# Patient Record
Sex: Female | Born: 1950
Health system: Southern US, Community
[De-identification: ages and names within clinical notes are randomized; demographics above are authoritative.]

## PROBLEM LIST (undated history)

## (undated) DIAGNOSIS — E785 Hyperlipidemia, unspecified: Secondary | ICD-10-CM

## (undated) DIAGNOSIS — E119 Type 2 diabetes mellitus without complications: Secondary | ICD-10-CM

## (undated) DIAGNOSIS — I639 Cerebral infarction, unspecified: Secondary | ICD-10-CM

## (undated) DIAGNOSIS — G629 Polyneuropathy, unspecified: Secondary | ICD-10-CM

## (undated) DIAGNOSIS — N189 Chronic kidney disease, unspecified: Secondary | ICD-10-CM

## (undated) DIAGNOSIS — I10 Essential (primary) hypertension: Secondary | ICD-10-CM

## (undated) DIAGNOSIS — B001 Herpesviral vesicular dermatitis: Secondary | ICD-10-CM

## (undated) DIAGNOSIS — C541 Malignant neoplasm of endometrium: Secondary | ICD-10-CM

## (undated) DIAGNOSIS — F329 Major depressive disorder, single episode, unspecified: Secondary | ICD-10-CM

## (undated) DIAGNOSIS — H3500 Unspecified background retinopathy: Secondary | ICD-10-CM

## (undated) DIAGNOSIS — G4733 Obstructive sleep apnea (adult) (pediatric): Secondary | ICD-10-CM

## (undated) DIAGNOSIS — K219 Gastro-esophageal reflux disease without esophagitis: Secondary | ICD-10-CM

## (undated) HISTORY — PX: CERVICAL SPINE SURGERY: SHX589

## (undated) HISTORY — DX: Obstructive sleep apnea (adult) (pediatric): G47.33

## (undated) HISTORY — DX: Chronic kidney disease, unspecified: N18.9

## (undated) HISTORY — DX: Gastro-esophageal reflux disease without esophagitis: K21.9

## (undated) HISTORY — DX: Morbid (severe) obesity due to excess calories: E66.01

## (undated) HISTORY — DX: Cerebral infarction, unspecified: I63.9

## (undated) HISTORY — DX: Major depressive disorder, single episode, unspecified: F32.9

## (undated) HISTORY — PX: CHOLECYSTECTOMY: SHX55

## (undated) HISTORY — DX: Malignant neoplasm of endometrium: C54.1

## (undated) HISTORY — DX: Hyperlipidemia, unspecified: E78.5

## (undated) HISTORY — PX: DILATION AND CURETTAGE OF UTERUS: SHX78

## (undated) HISTORY — PX: TONSILLECTOMY: SUR1361

## (undated) HISTORY — PX: ADRENALECTOMY: SHX876

## (undated) HISTORY — DX: Polyneuropathy, unspecified: G62.9

## (undated) HISTORY — DX: Herpesviral vesicular dermatitis: B00.1

## (undated) HISTORY — PX: CARDIAC CATHETERIZATION: SHX172

## (undated) HISTORY — DX: Unspecified background retinopathy: H35.00

## (undated) HISTORY — DX: Essential (primary) hypertension: I10

## (undated) HISTORY — PX: BUNIONECTOMY: SHX129

## (undated) HISTORY — DX: Type 2 diabetes mellitus without complications: E11.9

## (undated) HISTORY — PX: THORACOTOMY: SUR1349

## (undated) HISTORY — PX: LAPAROSCOPIC SALPINGOOPHERECTOMY: SUR795

---

## 2001-08-17 ENCOUNTER — Encounter: Payer: Self-pay | Admitting: Family Medicine

## 2001-08-17 ENCOUNTER — Encounter: Admission: RE | Admit: 2001-08-17 | Discharge: 2001-08-17 | Payer: Self-pay | Admitting: Family Medicine

## 2001-08-30 ENCOUNTER — Encounter: Payer: Self-pay | Admitting: Neurosurgery

## 2001-08-31 ENCOUNTER — Encounter: Payer: Self-pay | Admitting: Neurosurgery

## 2001-08-31 ENCOUNTER — Observation Stay (HOSPITAL_COMMUNITY): Admission: RE | Admit: 2001-08-31 | Discharge: 2001-09-01 | Payer: Self-pay | Admitting: Neurosurgery

## 2001-10-01 ENCOUNTER — Encounter: Payer: Self-pay | Admitting: Specialist

## 2001-10-01 ENCOUNTER — Encounter: Admission: RE | Admit: 2001-10-01 | Discharge: 2001-10-01 | Payer: Self-pay | Admitting: Specialist

## 2001-10-02 ENCOUNTER — Ambulatory Visit (HOSPITAL_BASED_OUTPATIENT_CLINIC_OR_DEPARTMENT_OTHER): Admission: RE | Admit: 2001-10-02 | Discharge: 2001-10-02 | Payer: Self-pay | Admitting: Specialist

## 2006-03-02 ENCOUNTER — Inpatient Hospital Stay (HOSPITAL_COMMUNITY): Admission: RE | Admit: 2006-03-02 | Discharge: 2006-03-11 | Payer: Self-pay | Admitting: Neurosurgery

## 2006-03-08 ENCOUNTER — Ambulatory Visit: Payer: Self-pay | Admitting: Physical Medicine & Rehabilitation

## 2006-04-05 ENCOUNTER — Encounter: Admission: RE | Admit: 2006-04-05 | Discharge: 2006-04-05 | Payer: Self-pay | Admitting: Thoracic Surgery

## 2006-05-24 ENCOUNTER — Encounter: Admission: RE | Admit: 2006-05-24 | Discharge: 2006-05-24 | Payer: Self-pay | Admitting: Thoracic Surgery

## 2006-05-24 ENCOUNTER — Ambulatory Visit: Payer: Self-pay | Admitting: Thoracic Surgery

## 2006-07-14 ENCOUNTER — Encounter (INDEPENDENT_AMBULATORY_CARE_PROVIDER_SITE_OTHER): Payer: Self-pay | Admitting: Specialist

## 2006-07-14 ENCOUNTER — Ambulatory Visit (HOSPITAL_COMMUNITY): Admission: RE | Admit: 2006-07-14 | Discharge: 2006-07-17 | Payer: Self-pay | Admitting: Surgery

## 2007-05-15 IMAGING — CR DG CHEST 2V
2 series · 2 of 2 positions shown · non-contrast
Comparison: 03/10/06.

CLINICAL DATA: Lung lesion.
 CHEST - TWO VIEWS:

[view not recorded (1 of 2)]
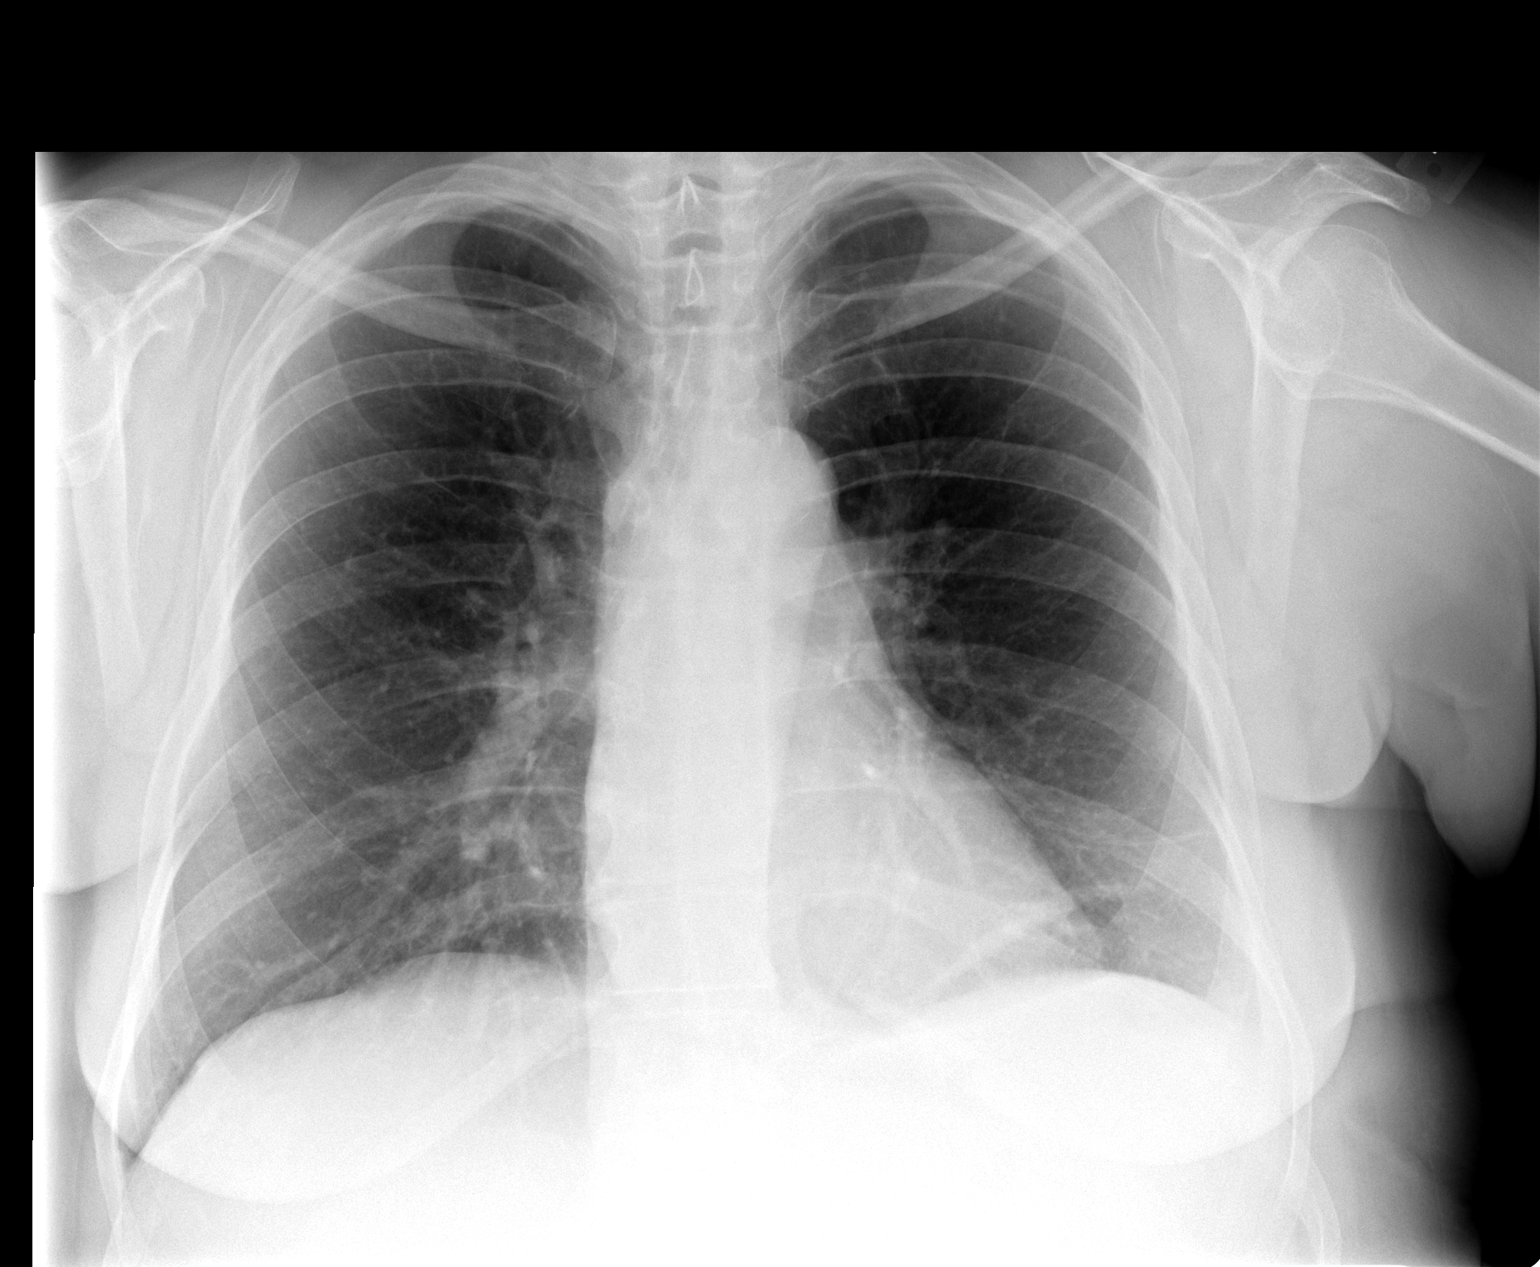

[view not recorded (2 of 2)]
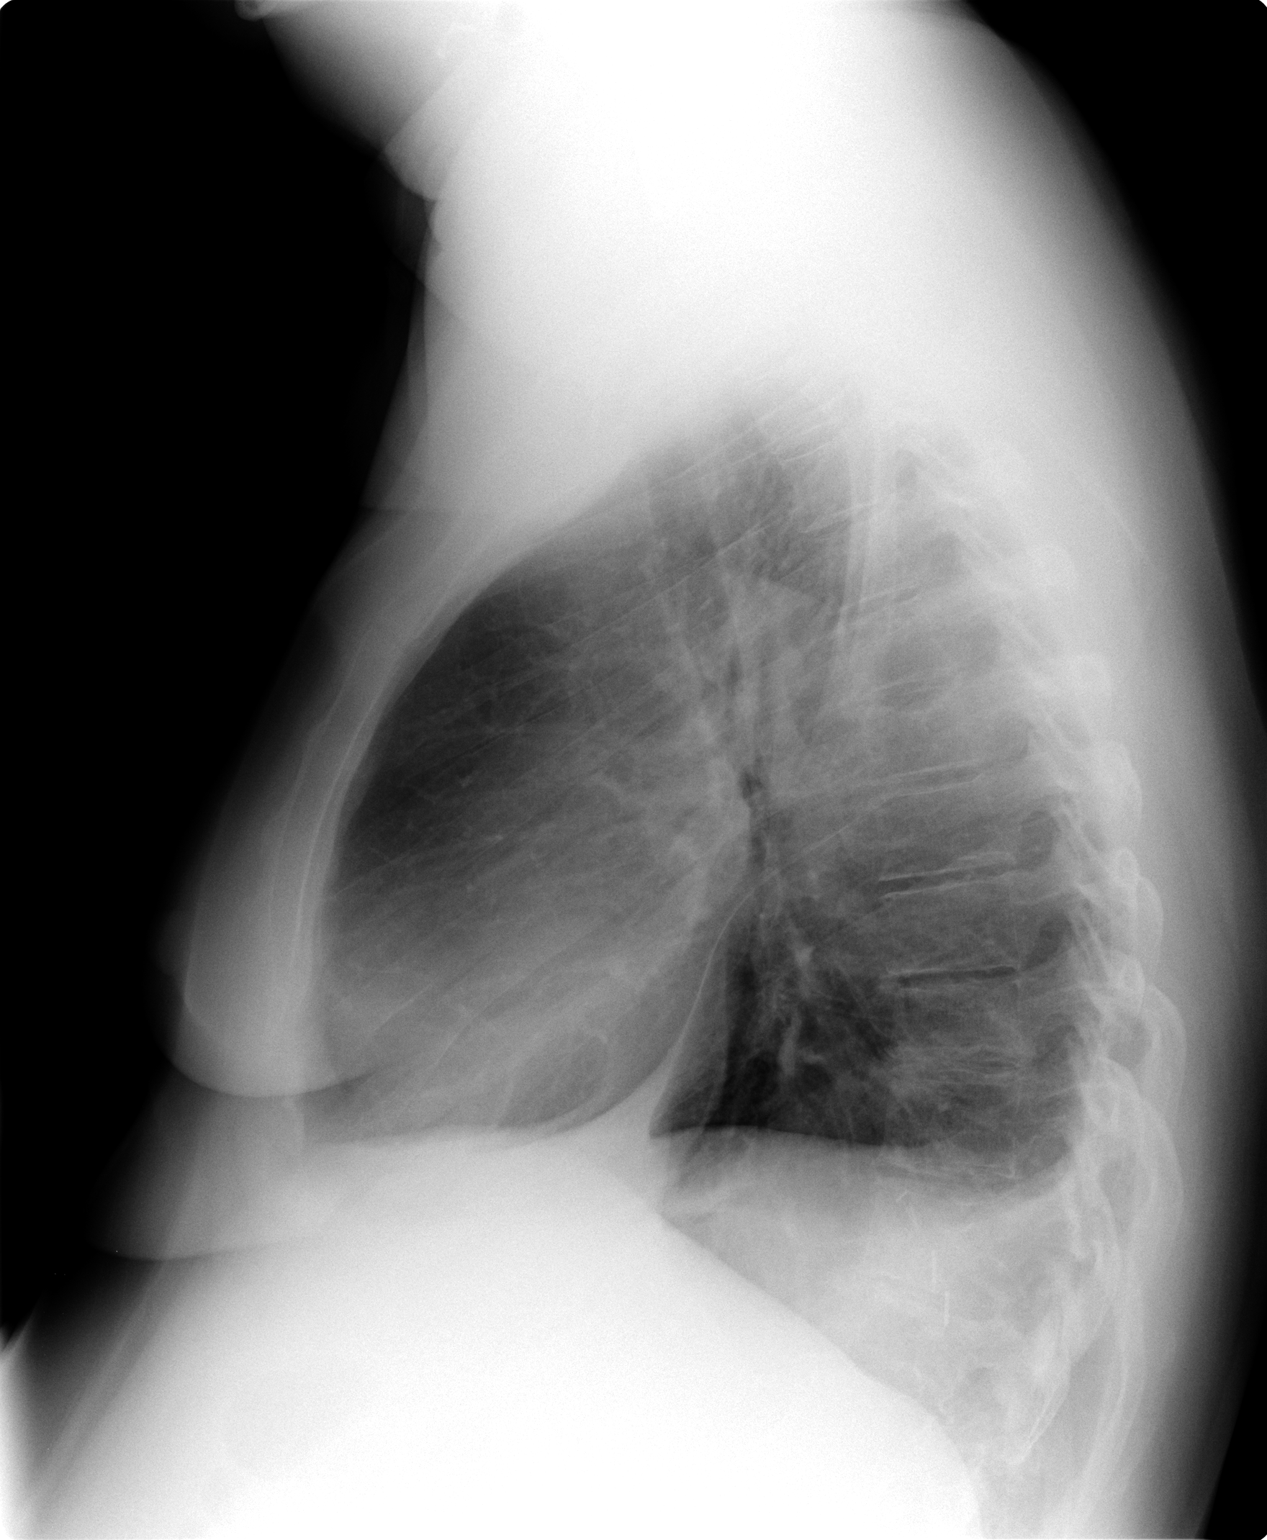

[2 of 2 positions shown; findings below may reference images not displayed]

FINDINGS: Trachea is midline.  Heart size within normal limits.  Left base atelectasis.  Lungs otherwise clear.  Small pleural effusion.
IMPRESSION: Left base atelectasis and small pleural effusion.

## 2007-07-31 ENCOUNTER — Inpatient Hospital Stay (HOSPITAL_COMMUNITY): Admission: AD | Admit: 2007-07-31 | Discharge: 2007-08-02 | Payer: Self-pay | Admitting: Interventional Cardiology

## 2007-08-16 ENCOUNTER — Encounter (HOSPITAL_COMMUNITY): Admission: RE | Admit: 2007-08-16 | Discharge: 2007-11-14 | Payer: Self-pay | Admitting: Interventional Cardiology

## 2007-11-15 ENCOUNTER — Encounter (HOSPITAL_COMMUNITY): Admission: RE | Admit: 2007-11-15 | Discharge: 2007-12-17 | Payer: Self-pay | Admitting: Cardiology

## 2008-06-23 ENCOUNTER — Encounter: Admission: RE | Admit: 2008-06-23 | Discharge: 2008-06-23 | Payer: Self-pay | Admitting: Podiatry

## 2010-03-21 DIAGNOSIS — C541 Malignant neoplasm of endometrium: Secondary | ICD-10-CM

## 2010-03-21 HISTORY — DX: Malignant neoplasm of endometrium: C54.1

## 2010-03-24 ENCOUNTER — Emergency Department (HOSPITAL_BASED_OUTPATIENT_CLINIC_OR_DEPARTMENT_OTHER)
Admission: EM | Admit: 2010-03-24 | Discharge: 2010-03-24 | Payer: Self-pay | Source: Home / Self Care | Admitting: Emergency Medicine

## 2010-03-24 LAB — GLUCOSE, CAPILLARY
Glucose-Capillary: 287 mg/dL — ABNORMAL HIGH (ref 70–99)
Glucose-Capillary: 195 mg/dL — ABNORMAL HIGH (ref 70–99)

## 2010-03-24 LAB — POCT CARDIAC MARKERS
CKMB, poc: 2.1 ng/mL (ref 1.0–8.0)
Myoglobin, poc: 120 ng/mL (ref 12–200)
Troponin i, poc: <0.05 ng/mL (ref 0.00–0.09)

## 2010-03-24 LAB — URINALYSIS, ROUTINE W REFLEX MICROSCOPIC
Bilirubin Urine: NEGATIVE
Ketones, ur: NEGATIVE mg/dL
Nitrite: NEGATIVE
Protein, ur: NEGATIVE mg/dL
Specific Gravity, Urine: 1.024 (ref 1.005–1.030)
Urine Glucose, Fasting: NEGATIVE mg/dL
Urobilinogen, UA: 0.2 mg/dL (ref 0.0–1.0)
pH: 5.5 (ref 5.0–8.0)

## 2010-03-24 LAB — COMPREHENSIVE METABOLIC PANEL
ALT: 36 U/L — ABNORMAL HIGH (ref 0–35)
AST: 32 U/L (ref 0–37)
Albumin: 4.6 g/dL (ref 3.5–5.2)
Alkaline Phosphatase: 95 U/L (ref 39–117)
BUN: 31 mg/dL — ABNORMAL HIGH (ref 6–23)
CO2: 26 mEq/L (ref 19–32)
Calcium: 10.6 mg/dL — ABNORMAL HIGH (ref 8.4–10.5)
Chloride: 98 mEq/L (ref 96–112)
Creatinine, Ser: 1.1 mg/dL (ref 0.4–1.2)
GFR calc Af Amer: >60 mL/min (ref >60)
GFR calc non Af Amer: 51 mL/min — ABNORMAL LOW (ref >60)
Glucose, Bld: 284 mg/dL — ABNORMAL HIGH (ref 70–99)
Potassium: 4.8 mEq/L (ref 3.5–5.1)
Sodium: 141 mEq/L (ref 135–145)
Total Bilirubin: 0.8 mg/dL (ref 0.3–1.2)
Total Protein: 8.5 g/dL — ABNORMAL HIGH (ref 6.0–8.3)

## 2010-03-24 LAB — DIFFERENTIAL
Basophils Absolute: 0 10*3/uL (ref 0.0–0.1)
Basophils Relative: 0 % (ref 0–1)
Eosinophils Absolute: 0.2 10*3/uL (ref 0.0–0.7)
Eosinophils Relative: 2 % (ref 0–5)
Lymphocytes Relative: 14 % (ref 12–46)
Lymphs Abs: 1.6 10*3/uL (ref 0.7–4.0)
Monocytes Absolute: 0.8 10*3/uL (ref 0.1–1.0)
Monocytes Relative: 7 % (ref 3–12)
Neutro Abs: 9 10*3/uL — ABNORMAL HIGH (ref 1.7–7.7)
Neutrophils Relative %: 77 % (ref 43–77)

## 2010-03-24 LAB — URINE MICROSCOPIC-ADD ON

## 2010-03-24 LAB — CBC
HCT: 38.8 % (ref 36.0–46.0)
Hemoglobin: 13.3 g/dL (ref 12.0–15.0)
MCH: 31.4 pg (ref 26.0–34.0)
MCHC: 34.3 g/dL (ref 30.0–36.0)
MCV: 91.5 fL (ref 78.0–100.0)
Platelets: 288 10*3/uL (ref 150–400)
RBC: 4.24 MIL/uL (ref 3.87–5.11)
RDW: 14.1 % (ref 11.5–15.5)
WBC: 11.6 10*3/uL — ABNORMAL HIGH (ref 4.0–10.5)

## 2010-03-24 LAB — LIPASE, BLOOD: Lipase: 230 U/L (ref 23–300)

## 2010-03-26 LAB — URINE CULTURE
Colony Count: >=100000
Culture  Setup Time: 201201041735

## 2010-04-11 ENCOUNTER — Encounter: Payer: Self-pay | Admitting: Thoracic Surgery

## 2010-04-26 ENCOUNTER — Encounter (HOSPITAL_COMMUNITY)
Admission: RE | Admit: 2010-04-26 | Discharge: 2010-04-26 | Disposition: A | Discharge status: Home / Self Care | Payer: BC Managed Care – PPO | Source: Ambulatory Visit | Attending: Obstetrics and Gynecology | Admitting: Obstetrics and Gynecology

## 2010-04-26 DIAGNOSIS — Z01812 Encounter for preprocedural laboratory examination: Secondary | ICD-10-CM | POA: Insufficient documentation

## 2010-04-26 LAB — CBC
HCT: 35.4 % — ABNORMAL LOW (ref 36.0–46.0)
Hemoglobin: 11.5 g/dL — ABNORMAL LOW (ref 12.0–15.0)
MCH: 30.9 pg (ref 26.0–34.0)
MCHC: 32.5 g/dL (ref 30.0–36.0)
MCV: 95.2 fL (ref 78.0–100.0)
Platelets: 244 10*3/uL (ref 150–400)
RBC: 3.72 MIL/uL — ABNORMAL LOW (ref 3.87–5.11)
RDW: 13.9 % (ref 11.5–15.5)
WBC: 7.8 10*3/uL (ref 4.0–10.5)

## 2010-04-26 LAB — BASIC METABOLIC PANEL
BUN: 14 mg/dL (ref 6–23)
CO2: 29 mEq/L (ref 19–32)
Calcium: 9.3 mg/dL (ref 8.4–10.5)
Chloride: 101 mEq/L (ref 96–112)
Creatinine, Ser: 0.94 mg/dL (ref 0.4–1.2)
GFR calc Af Amer: >60 mL/min (ref >60)
GFR calc non Af Amer: >60 mL/min (ref >60)
Glucose, Bld: 193 mg/dL — ABNORMAL HIGH (ref 70–99)
Potassium: 4.8 mEq/L (ref 3.5–5.1)
Sodium: 137 mEq/L (ref 135–145)

## 2010-04-27 ENCOUNTER — Ambulatory Visit (HOSPITAL_COMMUNITY)
Admission: RE | Admit: 2010-04-27 | Discharge: 2010-04-27 | Disposition: A | Discharge status: Home / Self Care | Payer: BC Managed Care – PPO | Source: Ambulatory Visit | Attending: Obstetrics and Gynecology | Admitting: Obstetrics and Gynecology

## 2010-04-27 ENCOUNTER — Other Ambulatory Visit: Payer: Self-pay | Admitting: Obstetrics and Gynecology

## 2010-04-27 DIAGNOSIS — N950 Postmenopausal bleeding: Secondary | ICD-10-CM | POA: Insufficient documentation

## 2010-04-27 DIAGNOSIS — Z01812 Encounter for preprocedural laboratory examination: Secondary | ICD-10-CM | POA: Insufficient documentation

## 2010-04-27 LAB — GLUCOSE, CAPILLARY: Glucose-Capillary: 201 mg/dL — ABNORMAL HIGH (ref 70–99)

## 2010-05-07 NOTE — Op Note (Signed)
  [name], [name]               ACCOUNT NO.:  [id number]  MEDICAL RECORD NO.:  [id number]           PATIENT TYPE:  O  LOCATION:  [facility]                          [facility]ACILITY[facility]  PHYSICIAN:  Patsy Baltimore, MD     DATE OF BIRTH:  Apr 22, 1950  DATE OF PROCEDURE:  04/27/2010 DATE OF DISCHARGE:                              OPERATIVE REPORT   PREOPERATIVE DIAGNOSIS:  Postmenopausal bleeding.  POSTOPERATIVE DIAGNOSIS:  Postmenopausal bleeding.  PROCEDURE PERFORMED:  Dilation and curettage.  SURGEON:  Patsy Baltimore, MD  ANESTHESIA:  MAC.  SPECIMENS SENT:  Endometrial curettings.  ESTIMATED BLOOD LOSS:  Minimal.  COMPLICATIONS:  None.  [name] is a 60 year old para 0 who presented to the office with postmenopausal bleeding.  She was unable to tolerate an office endometrial biopsy.  Therefore, she was scheduled for a formal D and C in the operating room.  Informed consent was obtained from the patient. On the day of the surgery, she was taken to the operating room with IV fluids running.  She was put under MAC anesthesia and then placed in the dorsal lithotomy position.  Her bladder was emptied.  She was prepped and draped in the usual sterile fashion.  A time-out was called and we began the procedure.  A speculum was inserted into the vagina.  A single- toothed tenaculum was used to grasp the anterior lip of the cervix.  Her cervix was then dilated.  A serrated curette was used to scrape the endometrial lining.  The specimen was sent off as endometrial curettings.  All the instruments were then removed from the vagina.  The patient was reawakened and transferred to the PACU in stable condition. Sponge and instrument counts were correct x2.          ______________________________ Patsy Baltimore, MD     CO/MEDQ  D:  04/27/2010  T:  04/28/2010  Job:  [id number]  Electronically Signed by Patsy Baltimore MD on 05/07/2010 06:41:01 PM

## 2010-05-13 ENCOUNTER — Ambulatory Visit: Payer: BC Managed Care – PPO | Attending: Gynecologic Oncology | Admitting: Gynecologic Oncology

## 2010-05-13 DIAGNOSIS — Z7982 Long term (current) use of aspirin: Secondary | ICD-10-CM | POA: Insufficient documentation

## 2010-05-13 DIAGNOSIS — I10 Essential (primary) hypertension: Secondary | ICD-10-CM | POA: Insufficient documentation

## 2010-05-13 DIAGNOSIS — F329 Major depressive disorder, single episode, unspecified: Secondary | ICD-10-CM | POA: Insufficient documentation

## 2010-05-13 DIAGNOSIS — F3289 Other specified depressive episodes: Secondary | ICD-10-CM | POA: Insufficient documentation

## 2010-05-13 DIAGNOSIS — Z79899 Other long term (current) drug therapy: Secondary | ICD-10-CM | POA: Insufficient documentation

## 2010-05-13 DIAGNOSIS — K219 Gastro-esophageal reflux disease without esophagitis: Secondary | ICD-10-CM | POA: Insufficient documentation

## 2010-05-13 DIAGNOSIS — C549 Malignant neoplasm of corpus uteri, unspecified: Secondary | ICD-10-CM | POA: Insufficient documentation

## 2010-05-13 DIAGNOSIS — Z803 Family history of malignant neoplasm of breast: Secondary | ICD-10-CM | POA: Insufficient documentation

## 2010-05-13 DIAGNOSIS — Z8042 Family history of malignant neoplasm of prostate: Secondary | ICD-10-CM | POA: Insufficient documentation

## 2010-05-13 DIAGNOSIS — N854 Malposition of uterus: Secondary | ICD-10-CM | POA: Insufficient documentation

## 2010-05-13 DIAGNOSIS — Z880 Allergy status to penicillin: Secondary | ICD-10-CM | POA: Insufficient documentation

## 2010-05-13 DIAGNOSIS — E7800 Pure hypercholesterolemia, unspecified: Secondary | ICD-10-CM | POA: Insufficient documentation

## 2010-05-13 DIAGNOSIS — E119 Type 2 diabetes mellitus without complications: Secondary | ICD-10-CM | POA: Insufficient documentation

## 2010-06-01 ENCOUNTER — Other Ambulatory Visit (HOSPITAL_COMMUNITY): Payer: BC Managed Care – PPO

## 2010-06-04 ENCOUNTER — Other Ambulatory Visit: Payer: Self-pay | Admitting: Gynecologic Oncology

## 2010-06-04 ENCOUNTER — Ambulatory Visit (HOSPITAL_COMMUNITY)
Admission: RE | Admit: 2010-06-04 | Discharge: 2010-06-04 | Disposition: A | Discharge status: Home / Self Care | Payer: BC Managed Care – PPO | Source: Ambulatory Visit | Attending: Obstetrics & Gynecology | Admitting: Obstetrics & Gynecology

## 2010-06-04 ENCOUNTER — Other Ambulatory Visit: Payer: Self-pay | Admitting: Obstetrics & Gynecology

## 2010-06-04 ENCOUNTER — Encounter (HOSPITAL_COMMUNITY): Payer: BC Managed Care – PPO

## 2010-06-04 DIAGNOSIS — C541 Malignant neoplasm of endometrium: Secondary | ICD-10-CM

## 2010-06-04 DIAGNOSIS — Z01818 Encounter for other preprocedural examination: Secondary | ICD-10-CM | POA: Insufficient documentation

## 2010-06-04 DIAGNOSIS — Z01812 Encounter for preprocedural laboratory examination: Secondary | ICD-10-CM | POA: Insufficient documentation

## 2010-06-04 DIAGNOSIS — E119 Type 2 diabetes mellitus without complications: Secondary | ICD-10-CM | POA: Insufficient documentation

## 2010-06-04 DIAGNOSIS — I10 Essential (primary) hypertension: Secondary | ICD-10-CM | POA: Insufficient documentation

## 2010-06-04 DIAGNOSIS — C549 Malignant neoplasm of corpus uteri, unspecified: Secondary | ICD-10-CM | POA: Insufficient documentation

## 2010-06-04 LAB — DIFFERENTIAL
Basophils Absolute: 0.1 10*3/uL (ref 0.0–0.1)
Basophils Relative: 1 % (ref 0–1)
Eosinophils Absolute: 0.2 10*3/uL (ref 0.0–0.7)
Eosinophils Relative: 2 % (ref 0–5)
Lymphocytes Relative: 26 % (ref 12–46)
Lymphs Abs: 2.2 10*3/uL (ref 0.7–4.0)
Monocytes Absolute: 0.7 10*3/uL (ref 0.1–1.0)
Monocytes Relative: 9 % (ref 3–12)
Neutro Abs: 5.3 10*3/uL (ref 1.7–7.7)
Neutrophils Relative %: 63 % (ref 43–77)

## 2010-06-04 LAB — BASIC METABOLIC PANEL
BUN: 20 mg/dL (ref 6–23)
CO2: 26 mEq/L (ref 19–32)
Calcium: 9.6 mg/dL (ref 8.4–10.5)
Chloride: 102 mEq/L (ref 96–112)
Creatinine, Ser: 0.97 mg/dL (ref 0.4–1.2)
GFR calc Af Amer: >60 mL/min (ref >60)
GFR calc non Af Amer: 59 mL/min — ABNORMAL LOW (ref >60)
Glucose, Bld: 264 mg/dL — ABNORMAL HIGH (ref 70–99)
Potassium: 4.1 mEq/L (ref 3.5–5.1)
Sodium: 136 mEq/L (ref 135–145)

## 2010-06-04 LAB — CBC
HCT: 36.4 % (ref 36.0–46.0)
Hemoglobin: 11.7 g/dL — ABNORMAL LOW (ref 12.0–15.0)
MCH: 30.8 pg (ref 26.0–34.0)
MCHC: 32.1 g/dL (ref 30.0–36.0)
MCV: 95.8 fL (ref 78.0–100.0)
Platelets: 247 10*3/uL (ref 150–400)
RBC: 3.8 MIL/uL — ABNORMAL LOW (ref 3.87–5.11)
RDW: 14.5 % (ref 11.5–15.5)
WBC: 8.4 10*3/uL (ref 4.0–10.5)

## 2010-06-04 LAB — ABO/RH: ABO/RH(D): O POS

## 2010-06-04 LAB — SURGICAL PCR SCREEN
MRSA, PCR: NEGATIVE
Staphylococcus aureus: NEGATIVE

## 2010-06-04 LAB — TYPE AND SCREEN
ABO/RH(D): O POS
Antibody Screen: NEGATIVE

## 2010-06-08 ENCOUNTER — Other Ambulatory Visit: Payer: Self-pay | Admitting: Gynecologic Oncology

## 2010-06-08 ENCOUNTER — Ambulatory Visit (HOSPITAL_COMMUNITY)
Admission: RE | Admit: 2010-06-08 | Discharge: 2010-06-09 | Disposition: A | Discharge status: Home / Self Care | Payer: BC Managed Care – PPO | Source: Ambulatory Visit | Attending: Obstetrics & Gynecology | Admitting: Obstetrics & Gynecology

## 2010-06-08 DIAGNOSIS — Z01812 Encounter for preprocedural laboratory examination: Secondary | ICD-10-CM | POA: Insufficient documentation

## 2010-06-08 DIAGNOSIS — E119 Type 2 diabetes mellitus without complications: Secondary | ICD-10-CM | POA: Insufficient documentation

## 2010-06-08 DIAGNOSIS — I10 Essential (primary) hypertension: Secondary | ICD-10-CM | POA: Insufficient documentation

## 2010-06-08 DIAGNOSIS — C549 Malignant neoplasm of corpus uteri, unspecified: Secondary | ICD-10-CM | POA: Insufficient documentation

## 2010-06-08 DIAGNOSIS — Z01811 Encounter for preprocedural respiratory examination: Secondary | ICD-10-CM | POA: Insufficient documentation

## 2010-06-08 LAB — GLUCOSE, CAPILLARY
Glucose-Capillary: 221 mg/dL — ABNORMAL HIGH (ref 70–99)
Glucose-Capillary: 245 mg/dL — ABNORMAL HIGH (ref 70–99)
Glucose-Capillary: 252 mg/dL — ABNORMAL HIGH (ref 70–99)
Glucose-Capillary: 267 mg/dL — ABNORMAL HIGH (ref 70–99)

## 2010-06-09 LAB — GLUCOSE, CAPILLARY
Glucose-Capillary: 227 mg/dL — ABNORMAL HIGH (ref 70–99)
Glucose-Capillary: 186 mg/dL — ABNORMAL HIGH (ref 70–99)

## 2010-06-09 LAB — CBC
HCT: 31.2 % — ABNORMAL LOW (ref 36.0–46.0)
Hemoglobin: 10.3 g/dL — ABNORMAL LOW (ref 12.0–15.0)
MCH: 31.2 pg (ref 26.0–34.0)
MCHC: 33 g/dL (ref 30.0–36.0)
MCV: 94.5 fL (ref 78.0–100.0)
Platelets: 219 10*3/uL (ref 150–400)
RBC: 3.3 MIL/uL — ABNORMAL LOW (ref 3.87–5.11)
RDW: 14 % (ref 11.5–15.5)
WBC: 11.3 10*3/uL — ABNORMAL HIGH (ref 4.0–10.5)

## 2010-06-09 NOTE — Op Note (Signed)
[name], ENGE               ACCOUNT NO.:  [id number]  MEDICAL RECORD NO.:  [id number]           PATIENT TYPE:  O  LOCATION:  [facility]                         FACILITY:  [facility]  PHYSICIAN:  Laurette Schimke, MD     DATE OF BIRTH:  1950-07-04  DATE OF PROCEDURE:  06/08/2010 DATE OF DISCHARGE:                              OPERATIVE REPORT   PREOPERATIVE DIAGNOSIS:  Endometrial cancer.  POSTOPERATIVE DIAGNOSIS:  Stage IA endometrial cancer.  PROCEDURE: 1. Robotic hysterectomy. 2. Bilateral salpingo-oophorectomy. 3. Right pelvic lymph node sampling.  SURGEON:  Laurette Schimke, M.D.  ASSISTANT: 1. Roseanna Rainbow, M.D. 2. Telford Nab, R.N.  ANESTHESIA:  General endotracheal.  FINDINGS:  A 6-cm uterus with posterior leiomyoma, normal appearing adnexa.  SPECIMENS:  Washings, uterus, cervix, ovaries, tubes, and right pelvic lymph node.  FINDINGS:  The patient was noted to be morbidly obese.  Uterus was noted to have a posterior leiomyoma.  The omentum was adherent to the left upper quadrant, and dissection was required.  Adnexa appeared grossly within normal limits.  DESCRIPTION OF PROCEDURE:  The patient taken to operating room, placed under general endotracheal anesthesia.  She was then placed in dorsal lithotomy position.  The abdomen and pelvis were prepped.  The uterus was sounded to approximately 6 cm.  The uterine manipulator was inserted as was a vaginal balloon and Foley catheter.  She was then draped in sterile fashion.  An OG tube previously placed had return of green gastric secretions.  An incision was made approximately 2 cm inferior to the left costal margin, and under direct visualization, the abdominal cavity was entered and the abdomen was insufflated.  A pressure 15 mmHg was obtained and never exceeded that threshold.  The left abdominal adhesions to the omentum were noted.  An incision was made at the level of the umbilicus and 10 cm lateral to  the umbilical incision.  Ports were placed in the left abdominal incision and the adhesions on the right were removed.  The robotic ports were then inserted in the right upper quadrant, additionally in the left pelvis.  The robot was successfully docked.  Washings were obtained.  With great difficulty prior to docking, bowel was removed from the pelvis.  Visualization was somewhat limited by the tethered rectosigmoid and loops of small bowel.  Incision was made in the right round ligament and retroperitoneal space entered.  The broad ligament was skeletonized and the infundibulopelvic ligaments grasped, cauterized, and transected.  The lower uterine flap was created and the posterior peritoneum dissected off the lower aspect of the cervix and uterosacral ligaments.  The uterine vessels were cauterized and then transected.  In a similar fashion, the contralateral round ligament was transected.  The retroperitoneal space entered. Infundibulopelvic ligament clamped and transected, and the remainder of the bladder flap dissected.  In the similar fashion, the peritoneum was dissected off the posterior aspect of the cervix.  The vaginal balloon was insufflated.  Uterine vessels on the left were coagulated and transected.  The ureters were both visualized.  Vaginal colpotomy was then made and the specimen removed and taken immediately for  frozen section.  Right pelvic lymph node dissection was performed.  The anterior vesical artery was identified.  The anterior and posterior spaces were dissected. The obturator nerve was visualized.  Dissection was begun off the external iliac artery, the obturator nerve, and external iliac artery.  Before completion of the procedure, frozen section returned with evidence of no Dillon disease and very minimal myometrial invasion of a grade 1 lesion.  The already transected specimens were placed in an Endopouch and removed from the abdominal cavity.  The  abdomen was copiously irrigated and drained.  Hemostasis was assured.  The vaginal cuff was closed with 0 Vicryl suture in a running fashion.  The surgical sites were once again inspected, suction applied, abdomen and pelvis irrigated and hemostasis assured.  Previously administered indigo carmine returned in the Foley catheter was no spill of indigo carmine within the abdominal cavity.  All the port sites of all the incisions with ports created 8 mm were closed with an interrupted 0 Vicryl suture. The port sites were irrigated and hemostasis assured.  The skin was closed with a running subcuticular suture.  Steri-Strips were placed over the incision.  Sponge, instrument, and needle count correct x3.  DISPOSITION:  The patient is extubated and taken to recovery room in stable condition.     Laurette Schimke, MD     WB/MEDQ  D:  06/08/2010  T:  06/08/2010  Job:  [id number]  cc:   Roseanna Rainbow, M.D. Fax: [phone]  Telford Nab, R.N. [address] [place], [place] [address]  Patsy Baltimore, MD Fax: [phone]  Electronically Signed by Laurette Schimke MD on 06/09/2010 02:19:47 PM

## 2010-06-11 NOTE — Consult Note (Signed)
[name], [name]               ACCOUNT NO.:  [id number]  MEDICAL RECORD NO.:  [id number]           PATIENT TYPE:  LOCATION:                                 FACILITY:  PHYSICIAN:  Laurette Schimke, MD     DATE OF BIRTH:  Nov 05, 1950  DATE OF CONSULTATION:  05/13/2010 DATE OF DISCHARGE:                                CONSULTATION   REFERRING PHYSICIAN:  Patsy Baltimore, MD  REASON FOR VISIT:  Consult was requested by Dr. Gevena Cotton for management of a grade 1 endometrial cancer.  HISTORY OF PRESENT ILLNESS:  [name] is a 60 year old nulliparous female, last normal menstrual period in the mid 16s.  Bleeding was noted on March 23, 2010, and she presented to emergency room on March 24, 2010.  She was then seen and evaluated by Dr. Patsy Baltimore.  An endometrial biopsy was not feasible because of the retroverted uterus and the patient's body habitus.  D and C was performed on April 27, 2010, and this returned with evidence of endometrial polyp in addition to invasive endometrial adenocarcinoma FIGO grade 1.  [name] denies bleeding since the procedure.  Overall, she denies any weight loss.  No change in her appetite.  She reports intermittent constipation.  There is no hematuria.  She reports rectal bleeding associated with her hemorrhoids.  PAST MEDICAL HISTORY: 1. Non-insulin-dependent diabetes mellitus, diagnosed in 1995. 2. Hypertension, diagnosed in 1995. 3. Hypercholesterolemia, diagnosed in 1995. 4. Depression dating to 2003. 5. Takotsubo cardiomyopathy in 2009. 6. Gastroesophageal reflux disease.  PAST SURGICAL HISTORY: 1. Tonsillectomy in 1957. 2. Oral surgery in 1967. 3. Resection of a wisdom tooth in 1980. 4. Open cholecystectomy in 1996. 5. Placement of a titanium disk in the neck in 2003. 6. Arthroscopic surgery of the left knee in 2003. 7. Partial toe amputation of the 3rd left toe in 2007. 8. Laparoscopic thoracotomy in 2007. 9. Laparoscopic removal of  left adrenal gland for a benign tumor in     2008. 10.Resection of left 5th metatarsal in 2009. 11.Tendon repair of the right ankle in 2010. 12.Uterine dilatation and curettage in 2012.  MEDICATIONS: 1. Aspirin 325 mg daily. 2. Fish oil capsules. 3. Calcium 600 mg daily. 4. Metformin hydrochloride 1000 mg twice daily. 5. Glimepiride 4 mg daily. 6. Losartan. 7. Potassium 50 mg daily. 8. Carvedilol 12.5 mg daily. 9. Pravastatin 40 mg daily. 10.Ranitidine 300 mg twice daily.  OBSTETRIC/GYNECOLOGIC HISTORY:  Nulliparous.  No history of abnormal Pap tests, last Pap greater than 3 years ago.  Menarche occurred at age of 60.  Menopause in the mid 40s.  FAMILY HISTORY:  Paternal aunt with breast cancer in the 75s, a paternal uncle with prostate cancer in the 53s, and a paternal grandmother with melanoma.  Her father is alive after a heart attack at the age of 61. History of hypertension in the maternal family.  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  She is single.  She denies tobacco, alcohol, or IV drug abuse.  Her sister accompanies her today.  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  REVIEW OF SYSTEMS:  Ten-point review of systems only noteworthy as above.  ALLERGIES: 1. SULFA that causes a rash. 2. PENICILLIN that causes a rash.  PHYSICAL EXAMINATION:  GENERAL:  Well-developed female in no acute distress. VITAL SIGNS:  Weight 284 pounds, height 5 feet 10 inches, BMI of 41. CHEST:  Clear to auscultation. HEART:  Regular rate and rhythm. ABDOMEN:  Cholecystectomy scar visible.  Port sites from the thoracotomy identified on the left.  The abdomen is obese and soft. PELVIC EXAMINATION:  Normal external genitalia, Bartholin's, urethral, and Skene's.  Nulliparous cervix, very well supported.  Unable to assess the size of the uterus because of the patient's body habitus.  No nodularities appreciated within the  cul-de-sac. EXTREMITIES:  2+ lower extremity edema with deformation of both feet. BACK:  No CVA tenderness. LYMPH NODE SURVEY:  No cervical, supraclavicular, or inguinal adenopathy.  IMPRESSION:  [name] has a grade 1 endometrioid endometrial adenocarcinoma.  The recommendation made for the patient with respect to approach to surgical staging was that of a minimally invasive approach. She is aware that if this is not feasible because of the upper abdominal scar tissue, because of the laparotomy and the thoracotomy, then an open procedure will be performed.  The planned procedure for removal of the uterus, ovary, and pelvic lymph nodes is indicated.  Risks of the procedure discussed with the patient were that of infection, bleeding, damage to the surrounding structures, prolonged hospitalization, reoperation.  All of her questions and all of those of her sister were answered.  The planned surgical staging will occur on June 08, 2010.  Dr. Antionette Char will be the surgical robotic assistant.     Laurette Schimke, MD     WB/MEDQ  D:  05/13/2010  T:  05/14/2010  Job:  [id number]  cc:   Patsy Baltimore, MD Fax: [phone]  Stacie Acres. Cliffton Asters, M.D. Fax: [phone] Signed by Laurette Schimke MD on 05/17/2010 09:38:48 AM

## 2010-06-18 ENCOUNTER — Other Ambulatory Visit: Payer: Self-pay | Admitting: Family Medicine

## 2010-06-18 DIAGNOSIS — R911 Solitary pulmonary nodule: Secondary | ICD-10-CM

## 2010-06-28 ENCOUNTER — Ambulatory Visit
Admission: RE | Admit: 2010-06-28 | Discharge: 2010-06-28 | Disposition: A | Discharge status: Home / Self Care | Payer: BC Managed Care – PPO | Source: Ambulatory Visit | Attending: Family Medicine | Admitting: Family Medicine

## 2010-06-28 DIAGNOSIS — R911 Solitary pulmonary nodule: Secondary | ICD-10-CM

## 2010-07-09 ENCOUNTER — Other Ambulatory Visit (HOSPITAL_COMMUNITY): Payer: Self-pay | Admitting: Family Medicine

## 2010-07-09 DIAGNOSIS — R911 Solitary pulmonary nodule: Secondary | ICD-10-CM

## 2010-07-22 ENCOUNTER — Ambulatory Visit: Payer: BC Managed Care – PPO | Admitting: Gynecologic Oncology

## 2010-08-03 NOTE — Discharge Summary (Signed)
[name], [name]               ACCOUNT NO.:  [id number]   MEDICAL RECORD NO.:  [id number]          PATIENT TYPE:  INP   LOCATION:  2040                         FACILITY:  [facility]   PHYSICIAN:  [name], M.D.  DATE OF BIRTH:  [birth year]   DATE OF ADMISSION:  07/31/2007  DATE OF DISCHARGE:  08/02/2007                               DISCHARGE SUMMARY   DISCHARGE DIAGNOSES:  1. Takeotsubo cardiomyopathy.  2. Diabetes mellitus.  3. Hypertension.  4. Dyslipidemia.  5. Family history of coronary artery disease.   [name] is a 60 year old female who began having substernal chest pain  at 7:45 a.m. on the date of admission.  The pain radiated into her jaw.  She proceed to the emergency room.  She was given aspirin, sublingual  nitroglycerin, and morphine and the pain decreased but was not  eradicated.   Her EKG showed inferolateral ST-segment elevation of 1.5 mm; therefore,  she was taken emergently to the cardiac catheterization lab.  Interestingly, she showed normal coronary arteries.  However, she was  diagnosed as having Takotsubo cardiomyopathy with an EF of 40%.  Wall  motion is as follows:  Anterior dyskinesis, diaphragmatic dyskinesis  with hyperdynamic motion of the anterior basal and inferior basal wall.  For this reason, the patient was started on ACE inhibitor, beta blocker,  and aspirin.  She was discharged to home after 2 days.   Nursing staff noticed O2 desaturation into the 80s during her sleep, and  the patient states that in the past she has been told that she had sleep  apnea.  She will need an outpatient sleep study.   DISCHARGE LABORATORIES:  Hemoglobin 11.6, hematocrit 33.0, white count  10.0, platelets 251, sodium 136, potassium 4.0, BUN 16, creatinine 1.02,  maximum troponin 1.32, total CK 156, MB fraction of 15.9.  LFTs normal.  TSH 1.426.   DISCHARGE MEDICATIONS:  Glucophage 1000 mg twice a day.  This is to be  restarted on Aug 04, 2007.   Otherwise, she is to start her medications  as follows;  1. Amaryl 8 mg a day.  2. Pravastatin continue her home dose, which she does not remember.  3. Zantac 150 mg twice a day.  4. Naprosyn as needed.  5. Wellbutrin.  Continue home dose.  She does not know her dosage at      this time.  6. Aspirin 325 mg a day.  7. Coreg 6.25 mg twice a day.  8. Lisinopril 2.5 mg every 12 hours.  9. She is to stop taking Hyzaar.   Remain on a low-sodium heart-healthy diabetic diet.  Clean cath site  gently with soap and water.  No scrubbing.  Increase activity as per  cardiac rehabilitation.  Follow-up with Dr. Amil [name] on Aug 16, 2007, at  10:10 a.m.      [name], P.A.      [name], M.D.  Electronically Signed    LB/MEDQ  D:  08/02/2007  T:  08/03/2007  Job:  [id number]

## 2010-08-03 NOTE — H&P (Signed)
[name], [name]               ACCOUNT NO.:  [id number]   MEDICAL RECORD NO.:  [id number]          PATIENT TYPE:  INP   LOCATION:  2901                         FACILITY:  [facility]   PHYSICIAN:  Francisca December, M.D.  DATE OF BIRTH:  October 10, 1950   DATE OF ADMISSION:  07/31/2007  DATE OF DISCHARGE:                              HISTORY & PHYSICAL   CHIEF COMPLAINT:  Chest pain.   HISTORY OF PRESENT ILLNESS:  [name] is a 60 year old female  who began having substernal chest pain around 7:45 today with radiation  to the jaw.  The pain has worsened intermittently over time.  She called  EMS and has received aspirin, sublingual nitroglycerin, 4 mg morphine  with pain grading down to a 2/10.  She has no coronary artery disease  but has also cardiac risk factors including diabetes, hyperlipidemia,  hypertension, family history of coronary artery disease, and obesity.   EKG review showed ST-segment elevation inferolaterally in 1.5 mm.   PAST MEDICAL HISTORY:  1. Diabetes mellitus.  2. Hyperlipidemia.  3. Hypertension.  4. Obesity.  5. Family history of coronary artery disease.  6. Status post cholecystectomy.  She has also had a leg problem that      required surgery and now she has neuropathy.   FAMILY HISTORY:  Father with a history of MI in his 2s.   SOCIAL HISTORY:  No tobacco, alcohol, illicit drug use.   ALLERGIES:  PENICILLIN and SULFA.   MEDICATIONS:  1. Glucophage 1000 mg b.i.d.  2. Amaryl  8 mg daily.  3. Pravastatin dose unknown.  4. Hyzaar 100/25 mg daily.  5. Zantac 150 mg p.o. b.i.d.  6. Naproxen 2 tablets daily.  7. Baby aspirin 81 mg today.  8. Wellbutrin dose unknown.   PHYSICAL EXAM:  VITAL SIGNS:  Blood pressure 152/92, pulse 89,  respirations 26, O2 saturation is 96% on 2 L.  HEENT:  Grossly normal.  No carotid or subclavian bruits.  No JVD or  thyromegaly.  Sclerae clear.  Conjunctivae normal.  Nose no drainage.  CHEST:  Clear to auscultation  bilaterally.  No wheezes or rhonchi.  HEART:  Regular rate and rhythm.  No evidence of murmur.  ABDOMEN:  Soft, nontender, nondistended, and no masses.  EXTREMITIES:  No peripheral edema with palpable distal pulses.  NEURO:  Alert and oriented x3 with normal mood and affect.   EKG shows inferolateral ST-segment elevation 1.5 mm.  Labs not yet  drawn.   ASSESSMENT/PLAN:  1. Acute lateral wall myocardial infraction by EKG.  2. Diabetes mellitus.  3. Hypertension.  4. Dyslipidemia.  5. Family history of coronary artery disease.  6. Obesity.   The patient was seen and examined by Dr. Amil Amen.  The patient will be  taken to the catheterization lab emergently.      Guy Franco, P.A.      Francisca December, M.D.  Electronically Signed    LB/MEDQ  D:  07/31/2007  T:  07/31/2007  Job:  [id number]

## 2010-08-06 NOTE — Discharge Summary (Signed)
[name], [name]               ACCOUNT NO.:  [id number]   MEDICAL RECORD NO.:  [id number]          PATIENT TYPE:  OIB   LOCATION:  1526                         FACILITY:  [facility]   PHYSICIAN:  Thornton Park. Daphine Deutscher, MD  DATE OF BIRTH:  1950/05/15   DATE OF ADMISSION:  07/14/2006  DATE OF DISCHARGE:  07/17/2006                               DISCHARGE SUMMARY   ADMITTING DIAGNOSIS:  Left adrenal mass   DISCHARGE DIAGNOSIS:  Left myelolipoma, benign 6-cm tumor.   PROCEDURE:  Laparoscopic left adrenalectomy.   COURSE IN THE HOSPITAL:  [name] underwent the above-mentioned  operation on July 14, 2006, and did very well.  She had minimal pain.  We checked her labs postoperatively and they were all stable.  She was  given Vicodin and ready for discharge on July 17, 2006.  Return to the  office in 1-2 weeks for followup.   FINAL DIAGNOSIS:  Left 6-cm benign adrenal mass, angiolipoma, status  post laparoscopic adrenalectomy on the left side.      Thornton Park Daphine Deutscher, MD  Electronically Signed     MBM/MEDQ  D:  08/16/2006  T:  08/16/2006  Job:  [id number]   cc:   Stacie Acres. Cliffton Asters, M.D.  Fax: [phone]

## 2010-08-06 NOTE — Op Note (Signed)
[place]  Patient:    [name], [name] Visit Number: [id number] MRN: [id number]          Service Type: DSU Location: [facility] Attending Physician:  Erasmo Leventhal Dictated by:   R. Valma Cava, M.D. Proc. Date: 10/02/01 Admit Date:  10/02/2001 Discharge Date: 10/02/2001                             Operative Report  PREOPERATIVE DIAGNOSIS:  Left knee symptomatic medial meniscal tear, possible chondral loose bodies with early patellofemoral arthritis.  POSTOPERATIVE DIAGNOSES: 1. Left knee complex tear of the medial meniscus. 2. Multiple chondral loose bodies. 3. Early osteoarthritis. 4. A grade 3 chondromalacia of the patella, grade 2 medial femoral condyle,    and grade 2 lateral tibial plateau.  PROCEDURES: 1. Left knee arthroscopic partial medial meniscectomy. 2. Tricompartmental chondroplasty. 3. Removal of multiple chondral loose bodies.  SURGEON:  R. Valma Cava, M.D.  ANESTHESIA:  Local block with general.  ESTIMATED BLOOD LOSS:  Less than 10 cc.  DRAINS:  None.  COMPLICATIONS:  None.  TOURNIQUET TIME:  None.  DISPOSITION:  To PACU stable.  OPERATIVE DETAILS:  The patient was counseled in the holding area, and correct site was identified, IV started, antibiotics given, and block was administered.  Taken OR placed in supine position, and her anesthesia per Dr. Gelene Mink.  At this time, she was placed in the left thigh holder, prepped with DuraPrep, and all draped in a sterile fashion.  She had a very large body habitus, and we were extremely cautious with her positioning.  Standard three portal arthroscopy was performed the proximal medial, anterior medial, and anterolateral portal.  Diagnostic arthroscopy with the following finding: Some grade 3 chondromalacia of the patella and a chondroplasty with mechanical shaver back to a stable base.  ACL and PCL were intact.  Medial compartment inspected.  Multiple  chondral loose bodies were in the knee, and these were sequentially removed with a motorized shaver.  Grade 2 chondromalacia of the medial femoral condyle with unstable flaps.  Mechanical chondroplasty performed back to a stable base.  The medial meniscus showed a complex, unstable, tear to the posterior horn of the medial meniscus which was irreparable.  Utilizing baskets and motorized shaver, partial medial meniscectomy was performed back to a back staple base, properly opened, and contoured.  Lateral compartment inspected.  Lateral meniscus appeared to be intact, thoroughly inspected, and also there was some grade 2 chondromalacia with some unstable flaps noted lateral to the plateau, and mechanical chondroplasty of the again was performed with the mechanical shaver.  Knee was copiously irrigated, reinspected, and no abnormalities were noted.  After copious irrigation, the arthroscopic equipment was removed.  The three portals were closed with 4-0 nylon suture.  Marcaine 30 cc of 0.5% with epinephrine and 4 mg of morphine sulfate was used postoperatively for pain and hemostasis as a knee block.  Sterile compressive dressing applied to the knee along with an ACE wrap.  No complications, gently awakened and taken from the operating room to PACU in stable condition.  The plan will be for stabilization in the PACU and discharged home stable. Dictated by:   R. Valma Cava, M.D. Attending Physician:  Erasmo Leventhal DD:  10/02/01 TD:  10/05/01 Job: [id number]

## 2010-08-06 NOTE — Op Note (Signed)
[name], [name]               ACCOUNT NO.:  [id number]   MEDICAL RECORD NO.:  [id number]          PATIENT TYPE:  INP   LOCATION:  2899                         FACILITY:  [facility]   PHYSICIAN:  Coletta Memos, M.D.     DATE OF BIRTH:  1950/05/13   DATE OF PROCEDURE:  03/02/2006  DATE OF DISCHARGE:                               OPERATIVE REPORT   PREOPERATIVE DIAGNOSES:  1. T11-12 herniated nucleus pulposus.  2. Spinal cord compression.   POSTOPERATIVE DIAGNOSES:  1. T11-12 herniated nucleus pulposus.  2. Spinal cord compression.   PROCEDURE:  1. Left thoracotomy for a T11-12 diskectomy with microdissection.  2. Arthrodesis T11-12  3. PEEK interior vertebral implant, 15 mm.   COMPLICATIONS:  None.   SURGEON:  Coletta Memos, M.D.   ASSISTANTLovell Sheehan.   THORACOTOMY:  Performed by Dr. Karle Plumber and was dictated under  separate note.   INDICATIONS:  Mrs. Loose presented with pain, which radiated around and  rotated on the right side, which has been quite painful.  She had an MRI  which showed what was a very large disk herniation on the right side at  T11-12.  Secondary to that I recommended and she agreed to undergo  operative decompression.  She had significant cord distortion and  compression on her films.  She had no evidence of myelopathy on exam.   After Dr. Edwyna Shell completed the thoracotomy, I entered the case.  The T11-  12 disk space was localized with radiographic assistance.  After  identifying the T11-12 disks, I then opened that with a #11 blade and  performed a diskectomy using pituitary rongeurs and curettes.  Then,  with Dr. Lovell Sheehan assistance, I drilled off the posterior lip of the T11  and T12 vertebral bodies, in order to identify the dura.  The dura was  quite thin, and the dural opening was made along the nerve root sleeve  on the left side.  We were then able with microdissection to remove what  was a calcified disk, using a high-speed drill and  micro instruments.  After ascertaining the spinal cord was well  decompressed, I then prepared the endplates for arthrodesis.  A 15 mm  cage was placed in a tangential fashion.  This was filled with  morselized allograft.  Tisseel was also placed into the wound site.  The  case was then turned over again to Dr. Edwyna Shell, who closed the  thoracotomy.           ______________________________  Coletta Memos, M.D.     KC/MEDQ  D:  03/02/2006  T:  03/02/2006  Job:  [id number]

## 2010-08-06 NOTE — Op Note (Signed)
[place]  Patient:    [name], [name] Visit Number: [id number] MRN: [id number]          Service Type: SUR Location: 3000 [id number] Attending Physician:  Coletta Memos Dictated by:   Mena Goes. Franky Macho, M.D. Proc. Date: 08/31/01 Admit Date:  08/31/2001 Discharge Date: 09/01/2001                             Operative Report  PREOPERATIVE DIAGNOSIS: 1. Displaced disk, left C5-6. 2. Left C6 radiculopathy.  POSTOPERATIVE DIAGNOSIS: 1. Displaced disk, left C5-6. 2. Left C6 radiculopathy.  OPERATION PERFORMED:  Anterior cervical diskectomy, C5-6; arthrodesis, C5-6, with anterior plating and allograft 8 mm Tutogen bone,  Synthes small stature plate, 18 mm used.  COMPLICATIONS:  None.  SURGEON:  Kyle L. Franky Macho, M.D.  ASSISTANT:  Clydene Fake, M.D.  ANESTHESIA:  INDICATIONS FOR PROCEDURE:  The patient is a 60 year old woman who since May 19 has had severe pain in the left upper extremity.  MRI shows a large disk herniation on the left side at L5-6.  She has weakness in the biceps and significant pain.  I had recommended and she agreed to undergo an anterior cervical diskectomy and arthrodesis with plating.  DESCRIPTION OF PROCEDURE:  The patient was brought to the operating room, intubated and placed under general anesthesia without difficulty.  Her head was placed in slight extension with 10 pounds of traction applied via chin strap on a cerebellar head rest.  Her neck was then prepped and she was draped in a sterile fashion.  I infiltrated 4 cc of 0.5% lidocaine 1:200,000 strength epinephrine into my proposed incision site.  This was from the midline to the medial border of the left sternocleidomastoid approximately three fingerbreadths above the sternal notch.  She had was a very full, prominent what I will assume to be her thyroid.  It was not nodular, but smooth, but was quite large.  I made skin incision with a #10 blade and took  this down to the platysma.  I dissected above the platysma rostrally and caudally.  I then opened the platysma in a horizontal fashion using Metzenbaum scissors.  I then identified the medial strap muscles in the sternocleidomastoid.  I created an avascular plane between these two structures.  I then placed a hand held retractor and used a Kitner to remove the soft tissue and exposed the anterior cervical spine.  Once this was done, I placed a spinal needle and then x-ray was taken.  I believe the needle was at C6-7 but it was not clear.  I placed another needle one disk space above the first needle.  That was at C5-6.  I then removed that.  I reflected the longus coli muscles laterally and placed a self-retaining Caspar retractor.  Once I was done, I opened the disk space with a #15 blade and removed a small amount of disk material.  I then placed two distraction pins, one in C5, the other in C6 and opened the disk space.  A microscope was brought into the operative field and I proceeded with a diskectomy using a 15 blade, straight curets, pituitary rongeurs and Kerrison punches.  I used an air drill to then remove osteophytes which were present inferiorly.  I then was able to open the posterior longitudinal ligament and expose the thecal sac.  This was done until I was able to decompress the right C6  nerve root.  I then turned my attention to the left side and removed what were three large pieces of disk which had been located in the neural foramen. Once that was done, I was quite satisfied that there was very good decompression of the left C6 nerve root.  I irrigated the wound.  I then used another drill to smooth out the end plates.  An 8 mm Tutogen bone graft was then placed.  I removed the distraction and then the distraction pins.  The weight was taken off the neck.  I then placed an 18 mm plate with the assistance of Dr. Phoebe Perch, into the neck.  Two screws were placed in C5, two  in C6.  Each screw was placed first by drilling, then tapping, then placing a screw.  Locking screws were placed.  X-ray was taken and showed the plate and plugs to be in good position along with the screws.  I then irrigated the wound once more.  I closed the wound in layered fashion using Vicryl sutures reapproximating the platysma initially.  I then reapproximated the subcutaneous tissues.  Dermabond was used for a sterile dressing.  The patient was extubated moving all extremities well. Dictated by:   Mena Goes. Franky Macho, M.D. Attending Physician:  Coletta Memos DD:  08/31/01 TD:  09/03/01 Job: [id number]

## 2010-08-06 NOTE — H&P (Signed)
[place]. [facility]  Patient:    [name], [name] Visit Number: [id number] MRN: [id number]          Service Type: SUR Location: 3000 [id number] Attending Physician:  Coletta Memos Dictated by:   Mena Goes. Franky Macho, M.D. Admit Date:  08/31/2001 Discharge Date: 09/01/2001                           History and Physical  ADMITTING DIAGNOSES: 1. Left C6 radiculopathy. 2. Displaced disk, C5-6.  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  She moved some heavy objects on May 17 and awoke on May 18 from what she describes as a crick in her neck.  By May 19 it was clear that this was more than a crick and she was in a good deal of pain in the left upper extremity.  The pain did not improve and she saw a physician at a walk-in clinic.  That physician prescribed pain medication and prednisone.  The pain continued to worsen and she went back and saw another physician who recommended that she obtain an MRI.  When that was completed she saw her primary care physician and it was felt that the findings warranted a neurosurgical evaluation.  PAST MEDICAL HISTORY: 1. Significant for hypertension. 2. Diabetes. 3. She has undergone a tooth extraction in the past. 4. Cholecystectomy.  ALLERGIES:  She has an allergy to PENICILLIN and SULFA, both of which cause a rash.  FAMILY HISTORY:  Mother, 47, is in good health.  Father, 2, is in good health.  He has asbestosis.  Medical problems in the family include glaucoma, diabetes, and hypertension.  SOCIAL HISTORY:  She does not smoke, drink, nor use illicit drugs.  She is 5 feet 11-1/2 inches tall and weighs 306 pounds.  REVIEW OF SYSTEMS:  Positive for eyeglasses, nasal congestion, sinus problems, hypertension, swelling in the feet, arm weakness, leg pain, neck pain, and diabetes.  She denies constitutional, respiratory,  gastrointestinal, genitourinary, skin, neurological, psychiatric, hematologic, and allergic problems.  MEDICATIONS:  1. Cozaar 100 mg once per day.  2. Amaryl 8 mg once per day.  3. Glucophage XR 2000 mg once per day.  4. Hydrocodone as needed.  5. Flexeril 10 mg 1-3 a day.  6. Vioxx 25 mg once a day.  7. She also takes supplements in the form of calcium 1200 mg.  8. Magnesium 400 mg.  9. Zinc 25 mg. 10. Vitamin B6 100 mg. 11. Vitamin B12 1000 mcg. 12. Vitamin C 1000 mg. 13. Vitamin E 400 international units.  PHYSICAL EXAMINATION:  VITAL SIGNS:  On exam she has a pulse of 80.  GENERAL:  She is alert, oriented x4, and answers all questions appropriately. She is in obvious distress with the left arm being held in a dependent position.  HEENT/NEUROLOGIC:  Pupils are equal, round, and reactive to light.  She has full extraocular movements.  Funduscopic exam and visual fields are normal. Facial sensation and movements are symmetric and normal.  Hearing intact to finger rub bilaterally.  Uvula elevates in midline.  Shoulder shrug is normal. Tongue protrudes in the midline.  Strength is 5/5 in the right upper and both lower extremities.  There is 4+/5 strength left biceps, 4/5 left wrist extensor.  She has 4+/5 left biceps, 4/5 left wrist extensors, normal strength otherwise left upper extremity.  Muscle tone, bulk, coordination are normal. Romberg test is negative.  Gait is normal.  Deep tendon reflexes are not elicited at the knees or ankles, 2+ at the biceps, triceps, and brachial radialis.  There is no clonus nor Hoffmans sign.  Toes are downgoing to plantar stimulation.  Pinprick and proprioception are normal.  There are no cervical masses, nor are there bruits.  LUNGS:  Clear.  HEART:  Regular rate and rhythm, no murmurs or rubs.  Pulses are good at the wrists and feet bilaterally.  LABORATORY:  MRI shows a large herniated disk, soft at C5-6 on the left  side, compromised at the left C6 nerve root, causing spinal cord distortion on the left.  No abnormal signal was seen.  The rest of the cord is normal.  Due to the fact that Ms. Adkison is already experiencing weakness, has a large disk and significant pain, I have recommended she undergo operative decompression via an anterior cervical diskectomy and subsequent arthrodesis, using anterior plating and allograft.  Risks of the procedure including bleeding, infection, no pain relief, bowel or bladder dysfunction, need for further surgery, recurrent laryngeal nerve injury causing hoarseness, were discussed.  She understands and wishes to proceed. Dictated by:   Mena Goes. Franky Macho, M.D. Attending Physician:  Coletta Memos DD:  08/31/01 TD:  09/02/01 Job: [id number]

## 2010-08-06 NOTE — Op Note (Signed)
[name], [name]               ACCOUNT NO.:  [id number]   MEDICAL RECORD NO.:  [id number]          PATIENT TYPE:  INP   LOCATION:  2899                         FACILITY:  [facility]   PHYSICIAN:  Ines Bloomer, M.D. DATE OF BIRTH:  11/08/1950   DATE OF PROCEDURE:  03/02/2006  DATE OF DISCHARGE:                               OPERATIVE REPORT   PREOPERATIVE DIAGNOSIS:  T11-T12 ruptured disc.   POSTOPERATIVE DIAGNOSIS:  T11-T12 ruptured disc.   OPERATION PERFORMED:  Left exploratory thoracotomy for exposure of T11-  T12 discectomy.   SURGEON:  Ines Bloomer, M.D.   FIRST ASSISTANT:  Coletta Memos, M.D.   After general anesthesia, the patient was turned to the left lateral  thoracotomy position.  We were asked to do the exposure by Dr. Franky Macho.  This patient had an acute T11 disc space rupture with cord impingement.  A dual lumen tube was inserted, the left lung was deflated, she was  turned to the left lateral thoracotomy position.  A posterolateral  thoracotomy incision was made and carried down with electrocautery  through the subcutaneous tissue and fascia.  The pretracheal fascia was  entered and the distal part of the latissimus and the rectus was divided  with electrocautery.  This was done between the 10th and 11th ribs.  Then, the 10th interspace was entered with electrocautery and Tuffier's  was placed in the space.  The 10th, 11th, and 12th ribs were identified  and then the pleural flap was created off of the 11th and 12th disc  space going from lateral to medial.  We clipped and divided the  intercostal artery.  Then, after the vertebral body had been exposed at  T12 and T11, this was confirmed to be the proper disc space with needle  localization divided.  Dr. Franky Macho then did a discectomy with Dr.  Lovell Sheehan.  Then, we returned and placed two chest tubes, a straight chest  tube anteriorly and a posterior chest tube posteriorly through separate  stab wounds and  these were sutured in place with 0 silk.  The lung was  reexpanded.  All retractors were removed.  The 28 chest tube was  anteriorly and right angle posteriorly.  The chest was closed with #2  pericostals, drilling the holes through the 11th rib and going around  the proximal rib.  Then, the muscle was closed with running #1 Vicryl  and interrupted #1 Vicryl, 2-0 Vicryl in the subcutaneous tissue, and  Dermabond for the skin.  The patient tolerated the procedure well and  was returned to the recovery room in stable condition.           ______________________________  Ines Bloomer, M.D.     DPB/MEDQ  D:  03/02/2006  T:  03/02/2006  Job:  [id number]

## 2010-08-06 NOTE — H&P (Signed)
[name], [name]               ACCOUNT NO.:  [id number]   MEDICAL RECORD NO.:  [id number]          PATIENT TYPE:  AMB   LOCATION:  DAY                          FACILITY:  [facility]   PHYSICIAN:  Thornton Park. Daphine Deutscher, MD  DATE OF BIRTH:  [birth year]   DATE OF ADMISSION:  07/14/2006  DATE OF DISCHARGE:                              HISTORY & PHYSICAL   CHIEF COMPLAINT:  Left adrenal mass.   HISTORY OF PRESENT ILLNESS:  [name] is a 60 year old white female  who slipped in the bathtub and sustained a T11-T12 compression fracture  with disk extrusion requiring a decompressive surgery.  He required  decompression laminectomy back in December.  At the time of her workup  she had CT and an MRI, and these showed a 6 cm large encapsulated,  circumscribed left adrenal mass felt to be benign.  MR thought this was  consistent with adrenal adenoma containing a lot of fat.  She was  referred and seen by me in December, and arrangements were made to try  to get this done in February at her convenience.  She at that time  developed a MRSA infection on her toe and was being treated by Dr. Sherlynn Stalls.  She had been released from her prior surgery by Dr. Franky Macho  and Dr. Edwyna Shell.   She is rescheduled at this time for left adrenalectomy.  She had been  seen in the office, and informed consent had been obtained regarding  laparoscopic as well as open adrenalectomy and the risks and benefits of  the surgery.  This was discussed with her again in the holding area  prior to this procedure.   MEDICATIONS:  1. Hyzaar.  2. Metformin 1000 mg twice a day.  3. Glipizide 4 mg twice a day.  4. Pravastatin 40 mg once a day.  5. Fluoxetine 20 mg once a day.  6. Ranitidine 300 mg twice a day.  7. Aspirin 81 mg daily.   ALLERGIES:  SULFA DRUGS and PENICILLIN.   PAST SURGICAL HISTORY:  Prior surgery includes an open cholecystectomy  in 1986, disk replacement in neck 2003, arthroscopic surgery on the  left  knee in 2003, bone biopsy with partial amputation October3 to the left  foot, thoracotomy for ruptured disk that was in December2007.   SOCIAL HISTORY:  The patient does not smoke or drink.   FAMILY HISTORY:  Father is living with asbestosis.   REVIEW OF SYSTEMS:  Positive for high blood pressure, some circulation  issues in her legs, easy bruisability, elevated cholesterol.   PHYSICAL EXAMINATION:  VITAL SIGNS:  Weight 265, blood pressure 147/94.  HEENT:  Head normocephalic.  Eyes:  Sclerae nonicteric.  Pupils equal,  round, and react to light.  Nose and throat exam unremarkable.  NECK:  Supple.  No masses or adenopathy.  CHEST:  Clear to auscultation.  HEART:  Sinus rhythm without murmurs or gallops.  ABDOMEN:  Moderately obese with well-healed scars from her previous  cholecystectomy.  EXTREMITIES:  Full range of motion but she has had  previous arthroscopic surgery on the left.  NEUROLOGICAL:  Alert and  oriented x3.  Motor and sensory function are grossly intact.   PLAN:  Laparoscopic/open left adrenalectomy.      Thornton Park Daphine Deutscher, MD  Electronically Signed     MBM/MEDQ  D:  07/14/2006  T:  07/14/2006  Job:  [id number]   cc:   Stacie Acres. Cliffton Asters, M.D.  Fax: [phone]

## 2010-08-06 NOTE — Discharge Summary (Signed)
[name], [name]               ACCOUNT NO.:  [id number]   MEDICAL RECORD NO.:  [id number]          PATIENT TYPE:  INP   LOCATION:  3016                         FACILITY:  [facility]   PHYSICIAN:  Coletta Memos, M.D.     DATE OF BIRTH:  09-19-1950   DATE OF ADMISSION:  03/02/2006  DATE OF DISCHARGE:  03/11/2006                               DISCHARGE SUMMARY   ADMITTING DIAGNOSIS:  Thoracic disk herniation, T11-12.   DISCHARGE DIAGNOSIS:  Thoracic disk herniation, T11-12.   PROCEDURE:  Thoracotomy for a T11-12 diskectomy with arthrodesis using  Peek 15 mm __________ cage.   DISCHARGE STATUS:  Weakness in left iliopsoas approximately 3 to 4-/5  when standing.   INDICATIONS:  [name] is a woman who presented with pain that was  radiating around the right flank.  This corresponded to a disk  herniation which was fairly large at T11-12 causing cord compression.  She was admitted and taken to the operating room for operative resection  of the disk.  This was done on March 02, 2006.   Postoperatively, she had weakness in the left iliopsoas.  Her distal  strength in the left lower extremity was normal with 5/5, and the right  lower extremity was 5/5.  She has made some gains with that leg.  She is  able to walk with a walker.  Her wound is clean, dry, and there are no  signs of infection.  She will be discharged home with Percocet and  Flexeril.  She is voiding without difficulty.  She has been able to have  a bowel movement.  She will have a return appointment to see both myself  and the thoracic surgeons.           ______________________________  Coletta Memos, M.D.     KC/MEDQ  D:  03/11/2006  T:  03/12/2006  Job:  [id number]

## 2010-08-06 NOTE — Op Note (Signed)
NAMEAUBRIEE, SZETO               ACCOUNT NO.:  [id number]   MEDICAL RECORD NO.:  [id number]          PATIENT TYPE:  AMB   LOCATION:  DAY                          FACILITY:  [facility]   PHYSICIAN:  Thornton Park. Daphine Deutscher, MD  DATE OF BIRTH:  1951-01-06   DATE OF PROCEDURE:  07/14/2006  DATE OF DISCHARGE:                               OPERATIVE REPORT   PREOPERATIVE DIAGNOSIS:  A 6 cm mass in the left adrenal consistent with  left adrenal adenoma.   POSTOPERATIVE DIAGNOSIS:  A 6 cm mass in the left adrenal consistent  with left adrenal adenoma.  Preliminary probable benign.  Permanent  sections pending.   PROCEDURE:  Laparoscopic left adrenalectomy.   SURGEON:  Luretha Murphy, MD   ASSISTANT:  Darnell Level, MD   ANESTHESIA:  General endotracheal.   DESCRIPTION OF PROCEDURE:  Ms. Viramontes was taken to room 1 on July 14, 2006, and given general anesthesia.  She was placed using a bean-bag  chair in the leaning spleen position with the right side down and  carefully padded, and she was then prepped with Techni-Care and draped  sterilely.  I entered the abdomen through the left upper quadrant using  an Optiview approach without difficulty, insufflating abdomen and then  placing 3 other 10 mm 10-11 trocars.   Using the harmonic scalpel, the dissection began by mobilizing the  splenic flexure which was done without difficulty.  I then took down the  splenorenal ligament, allowing the spleen to flop medially, exposing the  tail of the pancreas which we identified and stayed away from, the  splenic artery and the very prominent adrenal tumor.  Using the harmonic  scalpel, I mobilized this medially and gained mobility, and then I  mobilized the inferior aspect of this mass.  There, I dissected out the  adrenal vein and placed 3 clips on the staying side and 1 on the going  side and then harmonized between the 2, substantially freeing up the  adrenal gland.  I then mobilized the remaining  portion of the adrenal  gland, again using the harmonic scalpel and then once free, placed it in  a bag.  I placed Tisseal in the adrenal bed and then policed the area,  and no bleeding was noted.  There did not appear to be any problems,  bleeding, drainage, and then brought the bag out through an enlarged  trocar incision.  The bag was then extracted through the slightly  enlarged trocar incision.  The slightly enlarged trocar incision was  then closed in 3 layers with #1 PDS.  All  port sites were injected with Marcaine, and then they were closed with 3-  0 Vicryl.  The patient was taken to the recovery room in satisfactory  condition.   IMPRESSION:  A left adrenal mass status post laparoscopic left  adrenalectomy.      Thornton Park Daphine Deutscher, MD  Electronically Signed     MBM/MEDQ  D:  07/14/2006  T:  07/14/2006  Job:  [id number]   cc:   Holley Bouche, M.D.  Fax: [phone]   Ronaldo Miyamoto  Franky Macho, M.D.  Fax: [phone]   Ines Bloomer, M.D.  [address]  [place]  [place] [address]

## 2010-08-09 ENCOUNTER — Encounter (HOSPITAL_COMMUNITY)
Admission: RE | Admit: 2010-08-09 | Discharge: 2010-08-09 | Disposition: A | Discharge status: Home / Self Care | Payer: BC Managed Care – PPO | Source: Ambulatory Visit | Attending: Family Medicine | Admitting: Family Medicine

## 2010-08-09 DIAGNOSIS — R911 Solitary pulmonary nodule: Secondary | ICD-10-CM

## 2010-08-09 DIAGNOSIS — J984 Other disorders of lung: Secondary | ICD-10-CM | POA: Insufficient documentation

## 2010-08-09 DIAGNOSIS — Z90710 Acquired absence of both cervix and uterus: Secondary | ICD-10-CM | POA: Insufficient documentation

## 2010-08-09 DIAGNOSIS — I700 Atherosclerosis of aorta: Secondary | ICD-10-CM | POA: Insufficient documentation

## 2010-08-09 DIAGNOSIS — Z8542 Personal history of malignant neoplasm of other parts of uterus: Secondary | ICD-10-CM | POA: Insufficient documentation

## 2010-08-09 DIAGNOSIS — R160 Hepatomegaly, not elsewhere classified: Secondary | ICD-10-CM | POA: Insufficient documentation

## 2010-08-09 LAB — GLUCOSE, CAPILLARY: Glucose-Capillary: 253 mg/dL — ABNORMAL HIGH (ref 70–99)

## 2010-08-09 MED ORDER — FLUDEOXYGLUCOSE F - 18 (FDG) INJECTION
18.3000 | Freq: Once | INTRAVENOUS | Status: AC | PRN
  Administered 2010-08-09: 18.3 via INTRAVENOUS

## 2010-08-26 ENCOUNTER — Ambulatory Visit: Payer: BC Managed Care – PPO | Attending: Gynecologic Oncology | Admitting: Gynecologic Oncology

## 2010-08-26 DIAGNOSIS — C549 Malignant neoplasm of corpus uteri, unspecified: Secondary | ICD-10-CM | POA: Insufficient documentation

## 2010-08-27 NOTE — Consult Note (Signed)
  NAMEVENBA, ZENNER               ACCOUNT NO.:  [id number]  MEDICAL RECORD NO.:  [id number]  LOCATION:  GYN                          FACILITY:  [facility]  PHYSICIAN:  Laurette Schimke, MD     DATE OF BIRTH:  1950-04-12  DATE OF CONSULTATION:  08/26/2010 DATE OF DISCHARGE:                                CONSULTATION   REASON FOR VISIT:  Postoperative check for stage IA endometrial cancer.  HISTORY OF PRESENT ILLNESS:  This is a 60 year old who on dilatation and curettage on April 27, 2010, was noted to have a grade 1 endometrial adenocarcinoma. On June 08, 2010, she underwent robotic-assisted total hysterectomy, bilateral salpingo-oophorectomy, and right pelvic lymph node dissection.  Pathology was notable for the absence of metastatic disease to the cervix or lymph nodes.  There is no lymphovascular space involvement and myometrial invasion was 1 mm where the myometrium was 21 mm.  The tumor involved most of the endometrial cavity. Postoperatively, she reports intermittent vaginal spotting.PAST MEDICAL HISTORY:  No change.  PAST SURGICAL HISTORY:  No change.  REVIEW OF SYSTEMS:  No nausea, vomiting, fever, chills.  No abdominal pain.  She reports discharge from the right paraumbilical incision site, that has healed now.  She denies any dysuria or abdominal pain.  PHYSICAL EXAMINATION:  GENERAL:  A well-developed female, in no acute distress. VITAL SIGNS:  Weight 286 pounds, blood pressure 124/68, pulse 68, respiratory rate of 18. ABDOMEN:  Soft, nontender.  Incision site has healed well.  No evidence of cellulitis, tenderness, or hernia. BACK:  No CVA tenderness. PELVIC:  Some granulation tissue is noted at the vaginal cuff.  Silver nitrate was placed.  The cuff is intact.  No pooling within the vagina. No nodularity on cuff examination.  IMPRESSION:  Stage IA, grade 1 endometrioid endometrial adenocarcinoma. Ms. Warzecha has been advised to follow up with Dr. Gevena Cotton in 6 months  and with GYN oncology team in 1 year.  She is counseled regarding signs and symptoms of recurrent disease.  She is advised that imaging or biomarker assays for surveillance are not indicated.  She is aware that Dr. Gevena Cotton will perform her Pap at her visit.     Laurette Schimke, MD     WB/MEDQ  D:  08/26/2010  T:  08/26/2010  Job:  [id number]  cc:   Telford Nab, R.N. [address] [place], [place] [address]  [place]. Cliffton Asters, M.D. Fax: [phone]  Patsy Baltimore, MD Fax: [phone]  Electronically Signed by Laurette Schimke MD on 08/27/2010 10:05:49 AM

## 2010-12-15 LAB — COMPREHENSIVE METABOLIC PANEL
ALT: 30
AST: 33
Albumin: 3.4 — ABNORMAL LOW
Alkaline Phosphatase: 56
BUN: 19
CO2: 23
Calcium: 9.4
Chloride: 102
Creatinine, Ser: 1.11
GFR calc Af Amer: >60
GFR calc non Af Amer: 51 — ABNORMAL LOW
Glucose, Bld: 243 — ABNORMAL HIGH
Potassium: 4.2
Sodium: 136
Total Bilirubin: 0.7
Total Protein: 6.7

## 2010-12-15 LAB — CBC
HCT: 33.1 — ABNORMAL LOW
HCT: 34 — ABNORMAL LOW
Hemoglobin: 11.4 — ABNORMAL LOW
Hemoglobin: 11.6 — ABNORMAL LOW
MCHC: 33.5
MCHC: 35
MCV: 94
MCV: 94.1
Platelets: 222
Platelets: 251
RBC: 3.52 — ABNORMAL LOW
RBC: 3.62 — ABNORMAL LOW
RDW: 15.1
RDW: 15.1
WBC: 10
WBC: 8.2

## 2010-12-15 LAB — LIPID PANEL
Cholesterol: 117
HDL: 29 — ABNORMAL LOW
LDL Cholesterol: 46
Total CHOL/HDL Ratio: 4
Triglycerides: 211 — ABNORMAL HIGH
VLDL: 42 — ABNORMAL HIGH

## 2010-12-15 LAB — CARDIAC PANEL(CRET KIN+CKTOT+MB+TROPI)
CK, MB: 15.9 — ABNORMAL HIGH
CK, MB: 6.4 — ABNORMAL HIGH
Relative Index: 10.2 — ABNORMAL HIGH
Relative Index: 6.2 — ABNORMAL HIGH
Total CK: 103
Total CK: 156
Troponin I: 0.27 — ABNORMAL HIGH
Troponin I: 1.32

## 2010-12-15 LAB — BASIC METABOLIC PANEL
BUN: 16
CO2: 29
Calcium: 9.3
Chloride: 101
Creatinine, Ser: 1.02
GFR calc Af Amer: >60
GFR calc non Af Amer: 56 — ABNORMAL LOW
Glucose, Bld: 159 — ABNORMAL HIGH
Potassium: 4
Sodium: 136

## 2010-12-15 LAB — TSH: TSH: 1.426

## 2010-12-15 LAB — DIFFERENTIAL
Basophils Absolute: 0.1
Basophils Relative: 1
Eosinophils Absolute: 1 — ABNORMAL HIGH
Eosinophils Relative: 13 — ABNORMAL HIGH
Lymphocytes Relative: 22
Lymphs Abs: 1.8
Monocytes Absolute: 0.5
Monocytes Relative: 6
Neutro Abs: 4.8
Neutrophils Relative %: 58

## 2010-12-15 LAB — PROTIME-INR
INR: 1
Prothrombin Time: 13.6

## 2010-12-15 LAB — APTT: aPTT: 49 — ABNORMAL HIGH

## 2010-12-17 LAB — GLUCOSE, CAPILLARY: Glucose-Capillary: 147 — ABNORMAL HIGH

## 2014-02-22 ENCOUNTER — Encounter: Payer: Self-pay | Admitting: *Deleted

## 2014-08-04 ENCOUNTER — Other Ambulatory Visit: Payer: Self-pay | Admitting: Podiatry

## 2014-08-04 DIAGNOSIS — M869 Osteomyelitis, unspecified: Secondary | ICD-10-CM

## 2014-08-05 ENCOUNTER — Inpatient Hospital Stay: Admission: RE | Admit: 2014-08-05 | Payer: Self-pay | Source: Ambulatory Visit

## 2014-08-06 ENCOUNTER — Ambulatory Visit
Admission: RE | Admit: 2014-08-06 | Discharge: 2014-08-06 | Disposition: A | Discharge status: Home / Self Care | Payer: BLUE CROSS/BLUE SHIELD | Source: Ambulatory Visit | Attending: Podiatry | Admitting: Podiatry

## 2014-08-06 DIAGNOSIS — M869 Osteomyelitis, unspecified: Secondary | ICD-10-CM

## 2014-08-06 MED ORDER — GADOBENATE DIMEGLUMINE 529 MG/ML IV SOLN
10.0000 mL | Freq: Once | INTRAVENOUS | Status: AC | PRN
  Administered 2014-08-06: 10 mL via INTRAVENOUS

## 2014-08-07 ENCOUNTER — Other Ambulatory Visit: Payer: Self-pay | Admitting: Podiatry

## 2014-08-13 ENCOUNTER — Other Ambulatory Visit (HOSPITAL_COMMUNITY): Payer: Self-pay | Admitting: Podiatry

## 2014-08-13 DIAGNOSIS — M86671 Other chronic osteomyelitis, right ankle and foot: Secondary | ICD-10-CM

## 2016-06-07 DIAGNOSIS — E1165 Type 2 diabetes mellitus with hyperglycemia: Secondary | ICD-10-CM

## 2016-06-07 DIAGNOSIS — E782 Mixed hyperlipidemia: Secondary | ICD-10-CM

## 2016-06-07 DIAGNOSIS — N182 Chronic kidney disease, stage 2 (mild): Secondary | ICD-10-CM | POA: Insufficient documentation

## 2016-06-07 DIAGNOSIS — E1142 Type 2 diabetes mellitus with diabetic polyneuropathy: Secondary | ICD-10-CM

## 2016-06-07 DIAGNOSIS — I10 Essential (primary) hypertension: Secondary | ICD-10-CM | POA: Insufficient documentation

## 2016-06-07 DIAGNOSIS — E113293 Type 2 diabetes mellitus with mild nonproliferative diabetic retinopathy without macular edema, bilateral: Secondary | ICD-10-CM | POA: Insufficient documentation

## 2016-06-07 DIAGNOSIS — E119 Type 2 diabetes mellitus without complications: Secondary | ICD-10-CM | POA: Insufficient documentation

## 2016-06-07 DIAGNOSIS — Z794 Long term (current) use of insulin: Secondary | ICD-10-CM

## 2016-06-07 DIAGNOSIS — G4733 Obstructive sleep apnea (adult) (pediatric): Secondary | ICD-10-CM | POA: Insufficient documentation

## 2016-06-07 HISTORY — DX: Obstructive sleep apnea (adult) (pediatric): G47.33

## 2016-06-07 HISTORY — DX: Long term (current) use of insulin: E11.65

## 2016-06-07 HISTORY — DX: Mixed hyperlipidemia: E78.2

## 2016-06-07 HISTORY — DX: Chronic kidney disease, stage 2 (mild): N18.2

## 2016-06-07 HISTORY — DX: Type 2 diabetes mellitus with diabetic polyneuropathy: E11.42

## 2016-06-07 HISTORY — DX: Essential (primary) hypertension: I10

## 2016-10-04 DIAGNOSIS — L97522 Non-pressure chronic ulcer of other part of left foot with fat layer exposed: Secondary | ICD-10-CM | POA: Diagnosis not present

## 2016-10-20 ENCOUNTER — Ambulatory Visit (INDEPENDENT_AMBULATORY_CARE_PROVIDER_SITE_OTHER): Payer: PPO | Admitting: Podiatry

## 2016-10-20 ENCOUNTER — Encounter: Payer: Self-pay | Admitting: Podiatry

## 2016-10-20 ENCOUNTER — Ambulatory Visit (INDEPENDENT_AMBULATORY_CARE_PROVIDER_SITE_OTHER): Payer: PPO

## 2016-10-20 DIAGNOSIS — L97522 Non-pressure chronic ulcer of other part of left foot with fat layer exposed: Secondary | ICD-10-CM

## 2016-10-20 DIAGNOSIS — L03119 Cellulitis of unspecified part of limb: Secondary | ICD-10-CM | POA: Diagnosis not present

## 2016-10-20 DIAGNOSIS — L02619 Cutaneous abscess of unspecified foot: Secondary | ICD-10-CM

## 2016-10-20 LAB — CBC WITH DIFFERENTIAL/PLATELET
Basophils Absolute: 76 cells/uL (ref 0–200)
Basophils Relative: 1 %
Eosinophils Absolute: 380 cells/uL (ref 15–500)
Eosinophils Relative: 5 %
HCT: 40.7 % (ref 35.0–45.0)
Hemoglobin: 13.7 g/dL (ref 11.7–15.5)
Lymphocytes Relative: 23 %
Lymphs Abs: 1748 cells/uL (ref 850–3900)
MCH: 31.9 pg (ref 27.0–33.0)
MCHC: 33.7 g/dL (ref 32.0–36.0)
MCV: 94.9 fL (ref 80.0–100.0)
MPV: 8.9 fL (ref 7.5–12.5)
Monocytes Absolute: 760 cells/uL (ref 200–950)
Monocytes Relative: 10 %
Neutro Abs: 4636 cells/uL (ref 1500–7800)
Neutrophils Relative %: 61 %
Platelets: 287 10*3/uL (ref 140–400)
RBC: 4.29 MIL/uL (ref 3.80–5.10)
RDW: 14.4 % (ref 11.0–15.0)
WBC: 7.6 10*3/uL (ref 3.8–10.8)

## 2016-10-20 MED ORDER — CLINDAMYCIN HCL 300 MG PO CAPS: 300.0000 mg | ORAL_CAPSULE | Freq: Three times a day (TID) | ORAL | 0 refills | Status: DC

## 2016-10-20 NOTE — Progress Notes (Signed)
   Subjective:    [name], female    DOB: [birth year], 66 y.o.   MRN: [id number]  HPI  [name] the office today for concerns of an ulceration to the left foot which started in early May. She states that she has been using Neosporin as well as soaking her foot. She states his been somewhat bigger and she is a small amount of redness around the wound. She denies any red streaks. Denies any pain or swelling. Denies any drainage or pus expressed. She has a history of multiple surgeries to bilateral and history of multiple ulcerations present. She currently denies any systemic complaints as fevers, chills, nausea, vomiting. No calf pain, chest pain, surface of breath. Review of Systems  All other systems reviewed and are negative.      Objective:   Physical Exam General: AAO x3, NAD  Dermatological: On the left foot plantar medial aspect of the first MTPJ on the bunion area is a central granular with a running hyperkeratotic tissue. After debridement the wound measures 1.5 x 0.5 x 0.2 cm. The wound base is granular. Small rim of erythema around the wound but there is no ascending cellulitis. Mild swelling to the wound. There is no fluctuance or crepitus there is no malodor. No other open lesions about this time. Scars from prior surgeries were all well-healed.  Vascular: Dorsalis Pedis artery and Posterior Tibial artery pedal pulses are 2/4 bilateral with immedate capillary fill time.  There is no pain with calf compression, swelling, warmth, erythema.   Neruologic: Absent sensation with Sims once the monofilament.  Musculoskeletal: Multiple digital deformity is present. HAV present in the left side. Muscular strength 5/5 in all groups tested bilateral.     Assessment & Plan:  66 year old female left foot ulceration with localized infection -Treatment options discussed including all alternatives, risks, and complications -Etiology of symptoms were discussed -X-rays  were obtained and reviewed with the patient. HAV is present. There is no definitive evidence of acute osteomyelitis. There is no soft tissue emphysema present. -Wound sharply debrided today with a scalpel down granular healthy tissue. -Surgical shoe was dispensed with offloading Pegassist.  -Clindamycin -Ordered wound care supplies through [facility] for collagen/silver dressing changes. Until she gets them she can continue with a small amount of antibiotic ointment dressings daily.  -Elevation -Limit weightbearing -Ordered ESR, CRP, A1c, CMP, CBC.  -Monitor for any clinical signs or symptoms of infection and directed to call the office immediately should any occur or go to the ER. -RTC 1 week or sooner if needed  Celesta Gentile, DPM

## 2016-10-21 LAB — COMPLETE METABOLIC PANEL WITH GFR
ALT: 23 U/L (ref 6–29)
AST: 18 U/L (ref 10–35)
Albumin: 3.9 g/dL (ref 3.6–5.1)
Alkaline Phosphatase: 57 U/L (ref 33–130)
BUN: 27 mg/dL — ABNORMAL HIGH (ref 7–25)
CO2: 17 mmol/L — ABNORMAL LOW (ref 20–31)
Calcium: 9.4 mg/dL (ref 8.6–10.4)
Chloride: 104 mmol/L (ref 98–110)
Creat: 1.01 mg/dL — ABNORMAL HIGH (ref 0.50–0.99)
GFR, Est African American: 68 mL/min (ref >=60)
GFR, Est Non African American: 59 mL/min — ABNORMAL LOW (ref >=60)
Glucose, Bld: 261 mg/dL — ABNORMAL HIGH (ref 65–99)
Potassium: 4.5 mmol/L (ref 3.5–5.3)
Sodium: 139 mmol/L (ref 135–146)
Total Bilirubin: 0.3 mg/dL (ref 0.2–1.2)
Total Protein: 7.2 g/dL (ref 6.1–8.1)

## 2016-10-21 LAB — SEDIMENTATION RATE: Sed Rate: 84 mm/hr — ABNORMAL HIGH (ref 0–30)

## 2016-10-21 LAB — C-REACTIVE PROTEIN: CRP: 4.8 mg/L (ref <8.0)

## 2016-10-21 LAB — HEMOGLOBIN A1C
Hgb A1c MFr Bld: 7.5 % — ABNORMAL HIGH (ref <5.7)
Mean Plasma Glucose: 169 mg/dL

## 2016-10-25 ENCOUNTER — Telehealth: Payer: Self-pay | Admitting: *Deleted

## 2016-10-25 NOTE — Telephone Encounter (Addendum)
-----   Message from Trula Slade, DPM sent at 10/21/2016  1:20 PM EDT ----- WBC normal; Sed rate elevated-  Continue antibiotics  Follow up next week.10/25/2016-Left message requesting pt call for results. I informed pt of Dr. Leigh Aurora review of results and orders. Pt states understanding and is scheduled to be seen 10/31/2016.

## 2016-10-31 ENCOUNTER — Encounter: Payer: Self-pay | Admitting: Podiatry

## 2016-10-31 ENCOUNTER — Ambulatory Visit (INDEPENDENT_AMBULATORY_CARE_PROVIDER_SITE_OTHER): Payer: PPO | Admitting: Podiatry

## 2016-10-31 DIAGNOSIS — L97522 Non-pressure chronic ulcer of other part of left foot with fat layer exposed: Secondary | ICD-10-CM

## 2016-11-02 DIAGNOSIS — L97522 Non-pressure chronic ulcer of other part of left foot with fat layer exposed: Secondary | ICD-10-CM

## 2016-11-02 HISTORY — DX: Non-pressure chronic ulcer of other part of left foot with fat layer exposed: L97.522

## 2016-11-02 NOTE — Progress Notes (Signed)
Subjective: Ms. Morrow presents the office today for follow-up evaluation of ulceration the left foot. She states that she is doing better and she is interested of her foot as much as possible. She denies any drainage or pus. She has started the collagen silver dressing changes. She is wearing a surgical shoe. She denies any increases 1 redness to her feet and she denies any malodor. Denies any systemic complaints such as fevers, chills, nausea, vomiting. No acute changes since last appointment, and no other complaints at this time.   Objective: AAO x3, NAD DP/PT pulses palpable bilaterally, CRT less than 3 seconds To the left foot on the plantar medial aspect of the first MTPJ is an ulceration with a surrounding hyperkeratotic periwound with a granular wound base and small amount of fibrotic tissue. After debridement the wound was debrided to healthy, granular tissue in the wound measures 1.1 x 0.4 cm. The wound appears to be superficial. There is no probing, minor tunneling. There is no surrounding erythema, ascending cellulitis. There is no fluctuance or crepitus. There is no malodor. HAV is present. There is no other open lesions or pre-ulcerative lesions identified today.Marland Kitchen  No pain with calf compression, swelling, warmth, erythema  Assessment: Ulceration left foot with evidence of healing  Plan: -All treatment options discussed with the patient including all alternatives, risks, complications.  -Wound sharply debrided to the any complications to healthy, granular tissue. Recommended continue with daily dressing changes with the collagen silver. Continue a surgical shoe and offloading all times. Finish course of antibiotics. -Bloodwork results were reviewed with the patient. Will recheck in 2 weeks.  -Monitor for any clinical signs or symptoms of infection and directed to call the office immediately should any occur or go to the ER. -Patient encouraged to call the office with any questions,  concerns, change in symptoms.   Celesta Gentile, DPM

## 2016-11-03 DIAGNOSIS — E1140 Type 2 diabetes mellitus with diabetic neuropathy, unspecified: Secondary | ICD-10-CM | POA: Diagnosis not present

## 2016-11-03 DIAGNOSIS — R8290 Unspecified abnormal findings in urine: Secondary | ICD-10-CM | POA: Diagnosis not present

## 2016-11-03 DIAGNOSIS — N183 Chronic kidney disease, stage 3 (moderate): Secondary | ICD-10-CM | POA: Diagnosis not present

## 2016-11-03 DIAGNOSIS — F325 Major depressive disorder, single episode, in full remission: Secondary | ICD-10-CM | POA: Diagnosis not present

## 2016-11-03 DIAGNOSIS — E113299 Type 2 diabetes mellitus with mild nonproliferative diabetic retinopathy without macular edema, unspecified eye: Secondary | ICD-10-CM | POA: Diagnosis not present

## 2016-11-03 DIAGNOSIS — I129 Hypertensive chronic kidney disease with stage 1 through stage 4 chronic kidney disease, or unspecified chronic kidney disease: Secondary | ICD-10-CM | POA: Diagnosis not present

## 2016-11-03 DIAGNOSIS — E785 Hyperlipidemia, unspecified: Secondary | ICD-10-CM | POA: Diagnosis not present

## 2016-11-03 DIAGNOSIS — E1121 Type 2 diabetes mellitus with diabetic nephropathy: Secondary | ICD-10-CM | POA: Diagnosis not present

## 2016-11-11 ENCOUNTER — Ambulatory Visit (INDEPENDENT_AMBULATORY_CARE_PROVIDER_SITE_OTHER): Payer: PPO | Admitting: Podiatry

## 2016-11-11 DIAGNOSIS — L97522 Non-pressure chronic ulcer of other part of left foot with fat layer exposed: Secondary | ICD-10-CM

## 2016-11-14 NOTE — Progress Notes (Signed)
Subjective: Ms. Eckrich presents the office today for follow-up evaluation of ulceration the left foot. She states that overall she is doing better patient was of the wound is healing. She denies any drainage or pus coming the area. She denies any increase in swelling. She's doing a surgical shoe with offloading pads. She has and using the collagen, silver dressing. Denies any systemic complaints such as fevers, chills, nausea, vomiting. No acute changes since last appointment, and no other complaints at this time.   Objective: AAO x3, NAD DP/PT pulses palpable bilaterally, CRT less than 3 seconds To the left foot on the plantar medial aspect of the first MTPJ is an ulceration with a surrounding hyperkeratotic periwound with a granular wound base and small amount of fibrotic tissue. After debridement the wound was debrided to healthy, granular tissue in the wound measures 0.8 x 0.8 x 0.1 cm. The wound appears to be superficial with evidence of healing. There is no probing, undermining or tunneling. There is no surrounding erythema, ascending cellulitis. There is no fluctuance or crepitus. There is no malodor. HAV is present. There is no other open lesions or pre-ulcerative lesions identified today.Marland Kitchen  No pain with calf compression, swelling, warmth, erythema  Assessment: Ulceration left foot with evidence of healing  Plan: -All treatment options discussed with the patient including all alternatives, risks, complications.  -Wound sharply debrided to the any complications to healthy, granular tissue. Recommended continue with daily dressing changes with the collagen silver. Continue a surgical shoe and offloading all times. She is finished her antibiotics. A sense of infection and hold off any further antibiotics. There is no drainage in order to culture the wound. -She is doing well and I am going to hold off on blood work today and would repeat his next appointment. -Monitor for any clinical signs or  symptoms of infection and directed to call the office immediately should any occur or go to the ER. -Patient encouraged to call the office with any questions, concerns, change in symptoms.   Celesta Gentile, DPM

## 2016-11-28 ENCOUNTER — Ambulatory Visit (INDEPENDENT_AMBULATORY_CARE_PROVIDER_SITE_OTHER): Payer: PPO | Admitting: Podiatry

## 2016-11-28 ENCOUNTER — Encounter: Payer: Self-pay | Admitting: Podiatry

## 2016-11-28 DIAGNOSIS — L97522 Non-pressure chronic ulcer of other part of left foot with fat layer exposed: Secondary | ICD-10-CM

## 2016-11-30 NOTE — Progress Notes (Signed)
Subjective: Ms. Endres presents the office today for follow-up evaluation of ulceration the left foot. She states that overall she is been doing well. She's been continuing the collagen, silver dressing changes daily and she's imminent surgical shoe. She has been on her feet being more active and she has worn out the surgical shoe. She states that she was helping clean up in the yard. She denies any drainage or pus and she denies any swelling or redness or red streaks that she has noticed. She is leaving town this week to [place] her mother who has Alzheimer's. She has no other concerns today. Denies any systemic complaints such as fevers, chills, nausea, vomiting. No acute changes since last appointment, and no other complaints at this time.   Objective: AAO x3, NAD DP/PT pulses palpable bilaterally, CRT less than 3 seconds To the left foot on the plantar medial aspect of the first MTPJ is an ulceration with a surrounding hyperkeratotic periwound with a granular wound base and small amount of fibrotic tissue. After debridement the wound was debrided to healthy, granular tissue in the wound measures 0.8 x 0.3 x 0.1cm. The wound appears to be superficial with evidence of healing. There is no probing, undermining or tunneling. There is no surrounding erythema, ascending cellulitis. There is no fluctuance or crepitus. There is no malodor. HAV is present. There is no other open lesions or pre-ulcerative lesions identified today.Marland Kitchen  No pain with calf compression, swelling, warmth, erythema  Assessment: Ulceration left foot with evidence of healing  Plan: -All treatment options discussed with the patient including all alternatives, risks, complications.  -Wound sharply debrided to the any complications to healthy, granular tissue. Recommended continue with daily dressing changes with the collagen silver. Continue a surgical shoe and offloading all times. She is finished her antibiotics. A sense of infection  and hold off any further antibiotics. There is no drainage in order to culture the wound. -Monitor for any clinical signs or symptoms of infection and directed to call the office immediately should any occur or go to the ER. -Patient encouraged to call the office with any questions, concerns, change in symptoms.  -RTC on 12/15/2016 when she gets back from her trip.   *Patient left today without getting blood work and she is leaving to go out of town. The wound is healing and no signs of infection.   Celesta Gentile, DPM

## 2016-12-15 ENCOUNTER — Ambulatory Visit (INDEPENDENT_AMBULATORY_CARE_PROVIDER_SITE_OTHER): Payer: PPO | Admitting: Podiatry

## 2016-12-15 DIAGNOSIS — L97522 Non-pressure chronic ulcer of other part of left foot with fat layer exposed: Secondary | ICD-10-CM

## 2016-12-15 DIAGNOSIS — Z5189 Encounter for other specified aftercare: Secondary | ICD-10-CM | POA: Diagnosis not present

## 2016-12-15 NOTE — Progress Notes (Signed)
Subjective: [name] presents the office today for follow-up evaluation of ulceration the left foot. She states that she is doing well. She was on her feet quite a bit over the last week or so as she was on vacation she was to a lot of shopping. She has been using calcium alginate to the wound daily. She denies any drainage or pus coming from the area and she denies any swelling. She has no new concerns today. She has been wearing the surgical shoe. Denies any systemic complaints such as fevers, chills, nausea, vomiting. No acute changes since last appointment, and no other complaints at this time.   Objective: AAO x3, NAD DP/PT pulses palpable bilaterally, CRT less than 3 seconds To the left foot on the plantar medial aspect of the first MTPJ is an ulceration with a surrounding hyperkeratotic periwound with a granular wound base and small amount of fibrotic tissue. There is more hyperkeratotic tissue on the area today. After debridement the wound was debrided to healthy, granular tissue in the wound measures 0.8 x 0.2 x 0.1cm.  wound appears to be almost a fissure-type wound. There is no probing, undermining or tunneling. There is no surrounding erythema, ascending cellulitis. There is no fluctuance or crepitus. There is no malodor. HAV is present. There is no other open lesions or pre-ulcerative lesions identified today.Marland Kitchen  No pain with calf compression, swelling, warmth, erythema  Assessment: Ulceration left foot with evidence of healing  Plan: -All treatment options discussed with the patient including all alternatives, risks, complications.  -Wound sharply debrided to the any complications to healthy, granular tissue. Recommended continue with daily dressing changes as previous. Continue a surgical shoe and offloading all times.  -Will recheck blood work today.  -Monitor for any clinical signs or symptoms of infection and directed to call the office immediately should any occur or go to the  ER. -Patient encouraged to call the office with any questions, concerns, change in symptoms.  -RTC in 2 weeks or sooner if needed.  Celesta Gentile, DPM

## 2016-12-16 LAB — C-REACTIVE PROTEIN: CRP: 4.7 mg/L (ref <8.0)

## 2016-12-16 LAB — COMPLETE METABOLIC PANEL WITH GFR
AG Ratio: 1.3 (calc) (ref 1.0–2.5)
ALT: 17 U/L (ref 6–29)
AST: 19 U/L (ref 10–35)
Albumin: 3.9 g/dL (ref 3.6–5.1)
Alkaline phosphatase (APISO): 49 U/L (ref 33–130)
BUN: 17 mg/dL (ref 7–25)
CO2: 24 mmol/L (ref 20–32)
Calcium: 9.4 mg/dL (ref 8.6–10.4)
Chloride: 105 mmol/L (ref 98–110)
Creat: 0.87 mg/dL (ref 0.50–0.99)
GFR, Est African American: 81 mL/min/{1.73_m2} (ref >=60)
GFR, Est Non African American: 70 mL/min/{1.73_m2} (ref >=60)
Globulin: 3.1 g/dL (calc) (ref 1.9–3.7)
Glucose, Bld: 149 mg/dL — ABNORMAL HIGH (ref 65–139)
Potassium: 4.1 mmol/L (ref 3.5–5.3)
Sodium: 140 mmol/L (ref 135–146)
Total Bilirubin: 0.6 mg/dL (ref 0.2–1.2)
Total Protein: 7 g/dL (ref 6.1–8.1)

## 2016-12-16 LAB — CBC WITH DIFFERENTIAL/PLATELET
Basophils Absolute: 57 cells/uL (ref 0–200)
Basophils Relative: 0.8 %
Eosinophils Absolute: 767 cells/uL — ABNORMAL HIGH (ref 15–500)
Eosinophils Relative: 10.8 %
HCT: 39.2 % (ref 35.0–45.0)
Hemoglobin: 13.1 g/dL (ref 11.7–15.5)
Lymphs Abs: 1931 cells/uL (ref 850–3900)
MCH: 30.4 pg (ref 27.0–33.0)
MCHC: 33.4 g/dL (ref 32.0–36.0)
MCV: 91 fL (ref 80.0–100.0)
MPV: 9.7 fL (ref 7.5–12.5)
Monocytes Relative: 8.4 %
Neutro Abs: 3749 cells/uL (ref 1500–7800)
Neutrophils Relative %: 52.8 %
Platelets: 285 10*3/uL (ref 140–400)
RBC: 4.31 10*6/uL (ref 3.80–5.10)
RDW: 13.6 % (ref 11.0–15.0)
Total Lymphocyte: 27.2 %
WBC mixed population: 596 cells/uL (ref 200–950)
WBC: 7.1 10*3/uL (ref 3.8–10.8)

## 2016-12-16 LAB — HEMOGLOBIN A1C
Hgb A1c MFr Bld: 6.2 % of total Hgb — ABNORMAL HIGH (ref <5.7)
Mean Plasma Glucose: 131 (calc)
eAG (mmol/L): 7.3 (calc)

## 2016-12-16 LAB — SEDIMENTATION RATE: Sed Rate: 67 mm/h — ABNORMAL HIGH (ref 0–30)

## 2016-12-21 ENCOUNTER — Telehealth: Payer: Self-pay | Admitting: *Deleted

## 2016-12-21 NOTE — Telephone Encounter (Addendum)
-----   Message from Trula Slade, DPM sent at 12/20/2016  3:02 PM EDT ----- Please let her know that her blood work is all improved, including her A1c!!12/21/2016-Left message informing pt, of Dr. Leigh Aurora review of results as improved.

## 2016-12-26 ENCOUNTER — Ambulatory Visit (INDEPENDENT_AMBULATORY_CARE_PROVIDER_SITE_OTHER): Payer: PPO | Admitting: Podiatry

## 2016-12-26 ENCOUNTER — Encounter: Payer: Self-pay | Admitting: Podiatry

## 2016-12-26 VITALS — BP 140/76 | HR 73

## 2016-12-26 DIAGNOSIS — L03116 Cellulitis of left lower limb: Secondary | ICD-10-CM | POA: Diagnosis not present

## 2016-12-26 DIAGNOSIS — L97522 Non-pressure chronic ulcer of other part of left foot with fat layer exposed: Secondary | ICD-10-CM | POA: Diagnosis not present

## 2016-12-26 MED ORDER — DOXYCYCLINE HYCLATE 100 MG PO TABS: 100.0000 mg | ORAL_TABLET | Freq: Two times a day (BID) | ORAL | 0 refills | Status: DC

## 2016-12-29 ENCOUNTER — Ambulatory Visit: Payer: PPO | Admitting: Podiatry

## 2016-12-29 NOTE — Progress Notes (Signed)
Subjective: [name] presents the office today for follow-up evaluation of ulceration the left foot. She states that she change the bandage this morning and she has noticed some bleeding and drainage coming from the wound as well as some redness around is that she went to the clinic, and the evaluated. She denies any systemic complaints such as fevers, chills, nausea, vomiting. No Pain, chest pain, shortness of breath. She has her feet more but she does with a surgical shoe. She denies any red streaks. She has no other concerns today.  Denies any systemic complaints such as fevers, chills, nausea, vomiting. No acute changes since last appointment, and no other complaints at this time.   Objective: AAO x3, NAD DP/PT pulses palpable bilaterally, CRT less than 3 seconds To the left foot on the plantar medial aspect of the first MTPJ is an ulceration with a surrounding hyperkeratotic periwound with a granular wound base and small amount of fibrotic tissue. The wound is very to have more hyperkeratotic tissue around the area and there is some evidence of tried blood along the periphery of the wound. After debridement the wound measures about 1.1 x 0.2 x 0.1 cm in a slightly larger. It does reveal a slight increase in erythema toward the distal, medial aspect of the wound extends less than 1 cm around the area. There is no ascending cellulitis. There is no fluctuance or crepitus. There is no malodor. There is no increase in pain to the area. There is no probing to bone, undermining or tunneling. Hallux adductus is present. Prominence of the metatarsal head is present. No other open lesions or pre-ulcer lesions. No pain with calf compression, swelling, warmth, erythema  Assessment: Ulceration left footwith mild increase in size of the wound as well localized cellulitis   Plan: -All treatment options discussed with the patient including all alternatives, risks, complications.  The wound today was sharply  debrided without complications on to healthy, bleeding, granular tissue. Hemostasis was achieved. Given the increase in redness, start doxycycline. Continue surgical shoe and offloading. Recommend a small amount of antibiotic ointment to the wound daily. -Gave her a copy of the blood work that she has had done at her request.  -Monitor for any clinical signs or symptoms of infection and directed to call the office immediately should any occur or go to the ER. -RTC 1 week or sooner if needed.   *x-ray/blood work next appointment  Celesta Gentile, DPM

## 2017-01-03 ENCOUNTER — Ambulatory Visit (INDEPENDENT_AMBULATORY_CARE_PROVIDER_SITE_OTHER): Payer: PPO | Admitting: Podiatry

## 2017-01-03 ENCOUNTER — Ambulatory Visit (INDEPENDENT_AMBULATORY_CARE_PROVIDER_SITE_OTHER): Payer: PPO

## 2017-01-03 ENCOUNTER — Encounter: Payer: Self-pay | Admitting: Podiatry

## 2017-01-03 DIAGNOSIS — M2012 Hallux valgus (acquired), left foot: Secondary | ICD-10-CM

## 2017-01-03 DIAGNOSIS — L03116 Cellulitis of left lower limb: Secondary | ICD-10-CM | POA: Diagnosis not present

## 2017-01-03 DIAGNOSIS — L97522 Non-pressure chronic ulcer of other part of left foot with fat layer exposed: Secondary | ICD-10-CM

## 2017-01-04 LAB — CBC WITH DIFFERENTIAL/PLATELET
Basophils Absolute: 98 cells/uL (ref 0–200)
Basophils Relative: 1.3 %
Eosinophils Absolute: 945 cells/uL — ABNORMAL HIGH (ref 15–500)
Eosinophils Relative: 12.6 %
HCT: 38.3 % (ref 35.0–45.0)
Hemoglobin: 13.3 g/dL (ref 11.7–15.5)
Lymphs Abs: 2490 cells/uL (ref 850–3900)
MCH: 31 pg (ref 27.0–33.0)
MCHC: 34.7 g/dL (ref 32.0–36.0)
MCV: 89.3 fL (ref 80.0–100.0)
MPV: 9.4 fL (ref 7.5–12.5)
Monocytes Relative: 8.2 %
Neutro Abs: 3353 cells/uL (ref 1500–7800)
Neutrophils Relative %: 44.7 %
Platelets: 298 10*3/uL (ref 140–400)
RBC: 4.29 10*6/uL (ref 3.80–5.10)
RDW: 13.8 % (ref 11.0–15.0)
Total Lymphocyte: 33.2 %
WBC mixed population: 615 cells/uL (ref 200–950)
WBC: 7.5 10*3/uL (ref 3.8–10.8)

## 2017-01-04 LAB — COMPLETE METABOLIC PANEL WITH GFR
AG Ratio: 1.2 (calc) (ref 1.0–2.5)
ALT: 13 U/L (ref 6–29)
AST: 17 U/L (ref 10–35)
Albumin: 3.9 g/dL (ref 3.6–5.1)
Alkaline phosphatase (APISO): 54 U/L (ref 33–130)
BUN: 21 mg/dL (ref 7–25)
CO2: 23 mmol/L (ref 20–32)
Calcium: 9.4 mg/dL (ref 8.6–10.4)
Chloride: 106 mmol/L (ref 98–110)
Creat: 0.87 mg/dL (ref 0.50–0.99)
GFR, Est African American: 81 mL/min/{1.73_m2} (ref >=60)
GFR, Est Non African American: 70 mL/min/{1.73_m2} (ref >=60)
Globulin: 3.2 g/dL (calc) (ref 1.9–3.7)
Glucose, Bld: 89 mg/dL (ref 65–99)
Potassium: 4.4 mmol/L (ref 3.5–5.3)
Sodium: 136 mmol/L (ref 135–146)
Total Bilirubin: 0.6 mg/dL (ref 0.2–1.2)
Total Protein: 7.1 g/dL (ref 6.1–8.1)

## 2017-01-04 LAB — SEDIMENTATION RATE: Sed Rate: 62 mm/h — ABNORMAL HIGH (ref 0–30)

## 2017-01-04 LAB — C-REACTIVE PROTEIN: CRP: 3.1 mg/L (ref <8.0)
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
## 2017-01-17 ENCOUNTER — Ambulatory Visit (INDEPENDENT_AMBULATORY_CARE_PROVIDER_SITE_OTHER): Payer: PPO | Admitting: Podiatry

## 2017-01-17 DIAGNOSIS — L97522 Non-pressure chronic ulcer of other part of left foot with fat layer exposed: Secondary | ICD-10-CM | POA: Diagnosis not present

## 2017-01-18 NOTE — Progress Notes (Signed)
Subjective: [name] presents the office they for follow-up evaluation of a wound to left foot. 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 She denies any drainage or pus. She denies any pain. She denies any redness or red streaks. She is remaining the surgical shoe. She states that she is hopeful in that is doing well. She has no other concerns today. Denies any systemic complaints such as fevers, chills, nausea, vomiting. No acute changes since last appointment, and no other complaints at this time.   Objective: AAO x3, NAD DP/PT pulses palpable bilaterally, CRT less than 3 seconds HAV is present. This prominence the metatarsal heads plantarly On the plantar medial aspect of the right first MTPJ is an ulceration which does continue. There is a central ulceration with surrounding hyperkeratotic periwound. After debridement today the wound measures 1 x 0.2 cm and is superficial. There is no probing, undermining or tunneling. There is no surrounding erythema, ascending cellulitis. There is no fluxions or crepitus. There is no malodor. HAV is present most prominence under the area of the wound. No open lesions or other pre-ulcerative lesions.  No pain with calf compression, swelling, warmth, erythema  Assessment: Chronic ulceration left foot which is been ongoing since April  Plan: -All treatment options discussed with the patient including all alternatives, risks, complications.  -Wound sharply debrided so that any complications to healthy, granular tissue. Continue with daily dressing changes. Congenitally small amount of and antibiotic ointment dressing changes daily. No clinical signs of infections will hold off any further oral antibiotics. Aspirin hold off on advanced dressings at this point given her recent infection. Ultimately I still think she needs surgical intervention to remove the bony prominence underneath the area of the wound in order to get this wound to heal. She'll  consider this. -I reviewed her blood work with her today. -Monitor for any clinical signs or symptoms of infection and directed to call the office immediately should any occur or go to the ER. -RTC 2 weeks or sooner if needed.   Celesta Gentile, DPM

## 2017-02-02 DIAGNOSIS — Z794 Long term (current) use of insulin: Secondary | ICD-10-CM | POA: Diagnosis not present

## 2017-02-02 DIAGNOSIS — Z23 Encounter for immunization: Secondary | ICD-10-CM | POA: Diagnosis not present

## 2017-02-02 DIAGNOSIS — Z89411 Acquired absence of right great toe: Secondary | ICD-10-CM | POA: Diagnosis not present

## 2017-02-02 DIAGNOSIS — E1142 Type 2 diabetes mellitus with diabetic polyneuropathy: Secondary | ICD-10-CM | POA: Diagnosis not present

## 2017-02-03 ENCOUNTER — Ambulatory Visit: Payer: PPO | Admitting: Podiatry

## 2017-02-03 DIAGNOSIS — L97522 Non-pressure chronic ulcer of other part of left foot with fat layer exposed: Secondary | ICD-10-CM

## 2017-02-08 NOTE — Progress Notes (Signed)
Subjective: [name] presents the office they for follow-up evaluation of a wound to left foot.  She states that she is doing well and she feels that the wound is healing from what she can see.  She denies any drainage or pus.  She denies any swelling or redness to her foot.  She states that her last A1c is 5.8 she is very happy about this.  She is remained in the surgical shoe. Denies any systemic complaints such as fevers, chills, nausea, vomiting. No acute changes since last appointment, and no other complaints at this time.   Objective: AAO x3, NAD DP/PT pulses palpable bilaterally, CRT less than 3 seconds HAV is present. This prominence the metatarsal heads plantarly On the plantar medial aspect of the right first MTPJ is an ulceration which does continue.  After debridement today the wound measures 0.9 x 0.2 cm but appears to be much more superficial and healing and.  Appears as if the wound is starting to scab over as well.  The keratotic tissue is present along the wound but there is no macerated tissue.  There is no probing, undermining or tunneling.  There is no surrounding erythema, ascending cellulitis.  There is no probing, undermining or tunneling.  HIV is present. No open lesions or other pre-ulcerative lesions.  No pain with calf compression, swelling, warmth, erythema  Assessment: Chronic ulceration left foot with evidence of healing although slowly  Plan: -All treatment options discussed with the patient including all alternatives, risks, complications.  -I sharply debrided the wound with a scalpel down to healthy, granular tissue after verbal consent was obtained.  Continue small amount interval recommend dressing changes daily.  Discussed with her again today possible surgical intervention but she wished to hold off on that as she is going to be taking care of her mother over the holidays.  We will hold any further oral antibiotics at this point there is no clinical signs of  infection present. Monitor for any clinical signs or symptoms of infection and directed to call the office immediately should any occur or go to the ER. -RTC 2 weeks or sooner if needed.  Trula Slade DPM

## 2017-02-20 ENCOUNTER — Encounter: Payer: Self-pay | Admitting: Podiatry

## 2017-02-20 ENCOUNTER — Ambulatory Visit (INDEPENDENT_AMBULATORY_CARE_PROVIDER_SITE_OTHER): Payer: PPO | Admitting: Podiatry

## 2017-02-20 VITALS — Temp 97.8°F

## 2017-02-20 DIAGNOSIS — L97522 Non-pressure chronic ulcer of other part of left foot with fat layer exposed: Secondary | ICD-10-CM | POA: Diagnosis not present

## 2017-02-22 NOTE — Progress Notes (Signed)
Subjective: [name] presents the office they for follow-up evaluation of a wound to left foot.  She states that she is not sure how the wound is doing but she has not noticed any pus or any increase in redness or swelling.  She states that she does hope that the wound is doing better.  She has no other concerns today.  Her blood sugars remained controlled she states. Denies any systemic complaints such as fevers, chills, nausea, vomiting. No acute changes since last appointment, and no other complaints at this time.   Objective: AAO x3, NAD DP/PT pulses palpable bilaterally, CRT less than 3 seconds HAV is present. This prominence the metatarsal heads plantarly On the plantar medial aspect of the right first MTPJ is an ulceration which does continue.  After debridement today the wound measures 0.6 x 0.2 cm but appears to be  superficial and healing.  There was more hyperkeratotic tissue along the periphery of the wound.  There is no surrounding erythema, ascending cellulitis.  There is no fluctuance or crepitus.  There is no malodor.  No clinical signs of infection are noted today. No other open lesions or pre-ulcerative lesions identified. HAV is present. No pain with calf compression, swelling, warmth, erythema  Assessment: Chronic ulceration left foot with evidence of healing although slowly  Plan: -All treatment options discussed with the patient including all alternatives, risks, complications.  -I sharply debrided the wound with a scalpel down to healthy, granular tissue after verbal consent was obtained.  Continue small amount of antibiotic ointment dressing changes daily as this is what she has been doing and what appears to be healing.  Continue offloading at all times.  Discussed that after the holidays if the wound continues I recommend surgical intervention to help resect some bone to help take pressure off of the area.  She will consider this as an option wishes to hold off for  now. -Monitor for any clinical signs or symptoms of infection and directed to call the office immediately should any occur or go to the ER. -Follow-up in 2 weeks or sooner if needed.  Call any questions or concerns in the meantime.  She agrees with this plan.  Trula Slade DPM

## 2017-03-06 ENCOUNTER — Ambulatory Visit: Payer: PPO | Admitting: Podiatry

## 2017-03-06 ENCOUNTER — Encounter: Payer: Self-pay | Admitting: Podiatry

## 2017-03-06 DIAGNOSIS — L97501 Non-pressure chronic ulcer of other part of unspecified foot limited to breakdown of skin: Secondary | ICD-10-CM

## 2017-03-06 DIAGNOSIS — L97522 Non-pressure chronic ulcer of other part of left foot with fat layer exposed: Secondary | ICD-10-CM | POA: Diagnosis not present

## 2017-03-06 NOTE — Progress Notes (Signed)
Subjective: [name] presents the office they for follow-up evaluation of a wound to left foot. She states that she was on her feet a lot last week due to the snow and having to shovel. She has not been putting anything on the wound over than a band-aid. She has noticed some bloody drainage but no pus. No increase in swelling, redness or any issues. Denies any systemic complaints such as fevers, chills, nausea, vomiting. No acute changes since last appointment, and no other complaints at this time.   Objective: AAO x3, NAD DP/PT pulses palpable bilaterally, CRT less than 3 seconds HAV is present. This prominence the metatarsal heads plantarly On the plantar medial aspect of the right first MTPJ is an ulceration which does continue.  After debridement today the wound measures 1 x 0.4 x 0.2 cm. There is hyperkeratotic tissue along the periphery of the wound.  There is no surrounding erythema, ascending cellulitis.  There is no fluctuance or crepitus.  There is no malodor.  No clinical signs of infection are noted today. No other open lesions or pre-ulcerative lesions identified. HAV is present. No pain with calf compression, swelling, warmth, erythema  Assessment: Chronic ulceration left foot with evidence of healing although slowly and is likely due to the digital deformity  Plan: -All treatment options discussed with the patient including all alternatives, risks, complications.  -After verbal consent, I sharply debrided the wound with a scalpel down to healthy, granular tissue. Betadine was applied followed by a DSD. Continue offloading at all times. Monitor for any clinical signs or symptoms of infection and directed to call the office immediately should any occur or go to the ER.  -Again discussed surgery after the holidays if the wound continues. She is going to [place] to take care of her mom and cannot be off of her feet.  -RTC 2 weeks or sooner if needed.   Trula Slade DPM

## 2017-03-23 ENCOUNTER — Encounter: Payer: Self-pay | Admitting: Podiatry

## 2017-03-23 ENCOUNTER — Ambulatory Visit: Payer: PPO | Admitting: Podiatry

## 2017-03-23 DIAGNOSIS — L97522 Non-pressure chronic ulcer of other part of left foot with fat layer exposed: Secondary | ICD-10-CM

## 2017-03-23 DIAGNOSIS — M2012 Hallux valgus (acquired), left foot: Secondary | ICD-10-CM

## 2017-03-24 DIAGNOSIS — H2513 Age-related nuclear cataract, bilateral: Secondary | ICD-10-CM | POA: Diagnosis not present

## 2017-03-24 DIAGNOSIS — H43813 Vitreous degeneration, bilateral: Secondary | ICD-10-CM | POA: Diagnosis not present

## 2017-03-24 DIAGNOSIS — H5203 Hypermetropia, bilateral: Secondary | ICD-10-CM | POA: Diagnosis not present

## 2017-03-24 DIAGNOSIS — H524 Presbyopia: Secondary | ICD-10-CM | POA: Diagnosis not present

## 2017-03-24 DIAGNOSIS — H52223 Regular astigmatism, bilateral: Secondary | ICD-10-CM | POA: Diagnosis not present

## 2017-03-24 DIAGNOSIS — E113293 Type 2 diabetes mellitus with mild nonproliferative diabetic retinopathy without macular edema, bilateral: Secondary | ICD-10-CM | POA: Diagnosis not present

## 2017-03-26 NOTE — Progress Notes (Signed)
Subjective: [name] presents the office they for follow-up evaluation of a wound to left foot.  She states that the area is doing about the same.  She does admit that she hs been on her feet a lot more and she is been taking care of her mom.  She denies any pus but she still gets some clear bloody drainage at times.  She denies any swelling or increase in redness to her feet.  She also has questions about potential surgery.  She has no other concerns today. Denies any systemic complaints such as fevers, chills, nausea, vomiting. No acute changes since last appointment, and no other complaints at this time.   Objective: AAO x3, NAD DP/PT pulses palpable bilaterally, CRT less than 3 seconds HAV is present. This prominence the metatarsal heads plantarly On the plantar medial aspect of the right first MTPJ is an ulceration which does continue.  Prior to debridement today the wound appears to be almost healed and there is hyperkeratotic tissue over the wound.  After sharp debridement however the wound measures about the same size at 0.9 x 0.4 x 0.2 cm.  There is no surrounding erythema, ascending cellulitis.  There is no fluctuance or crepitus.  There is no malodor.  No clinical signs of infection are noted today. No other open lesions or pre-ulcerative lesions identified. HAV is present. No pain with calf compression, swelling, warmth, erythema  Assessment: Chronic ulceration left foot with evidence of healing although slowly and is likely due to the digital deformity  Plan: -All treatment options discussed with the patient including all alternatives, risks, complications.  -Excisional debridement of the wound was performed, see procedure note below. -We can discuss surgical intervention.  We discussed the surgery as well as the postoperative course and she will think about her options and due to financial reasons she wants to hold off on this until February of able.  Also we are going to go ahead and  get an insurance quote at her request.  -Continue offloading. -RTC 2 weeks or sooner if needed.   Procedure: Excisional Debridement of Wound Rationale: Removal of non-viable soft tissue from the wound to promote healing.  Pre-Debridement Wound Measurements: was covered in callus and nonviabale skin Post-Debridement Wound Measurements: 0.9 cm x 0.4 cm x 0.2 cm  Type of Debridement: Excisional Tissue Removed: Non-viable soft tissue Depth of Debridement: to subcutaneous tissue Instrumentation: 312 scalpel  Technique: Sharp excisional debridement to bleeding, viable wound base.  Dressing: Dry, sterile, compression dressing. Disposition: Patient tolerated procedure well. Patient to return in 2 weeks for follow-up.  Trula Slade DPM

## 2017-04-06 ENCOUNTER — Ambulatory Visit: Payer: PPO | Admitting: Podiatry

## 2017-04-14 ENCOUNTER — Encounter: Payer: Self-pay | Admitting: Podiatry

## 2017-04-14 ENCOUNTER — Ambulatory Visit: Payer: PPO | Admitting: Podiatry

## 2017-04-14 DIAGNOSIS — L97522 Non-pressure chronic ulcer of other part of left foot with fat layer exposed: Secondary | ICD-10-CM

## 2017-04-14 DIAGNOSIS — M2012 Hallux valgus (acquired), left foot: Secondary | ICD-10-CM

## 2017-04-14 MED ORDER — DOXYCYCLINE HYCLATE 100 MG PO TABS: 100.0000 mg | ORAL_TABLET | Freq: Two times a day (BID) | ORAL | 0 refills | Status: DC

## 2017-04-16 NOTE — Progress Notes (Signed)
Subjective: [name] presents the office they for follow-up evaluation of a wound to left foot.  She states that she had to miss her last appointment due to financial reasons.  She states that because of this the callus is gotten very thick and she could not see if there is still a wound underneath it.  She denies any surrounding redness or red streaks and she denies any drainage or pus or any increase in swelling to her feet.  She has no new concerns.  She is doing in the surgical shoe.  Denies any systemic complaints such as fevers, chills, nausea, vomiting. No acute changes since last appointment, and no other complaints at this time.   Objective: AAO x3, NAD DP/PT pulses palpable bilaterally, CRT less than 3 seconds HAV is present. This prominence the metatarsal heads plantarly On the plantar medial aspect of the right first MTPJ is an ulceration which does continue.  Hyperkeratotic lesion appears to be thicker today.  Upon debridement the wound is larger measuring 1.3 x 0.4 x 0.2 cm.  There is no probing to bone, undermining or tunneling.  There is no surrounding erythema, ascending cellulitis.  There is no fluctuation or crepitation.  There is no malodor. HAV is present.  I do think that the reason that she is still having the wound and calluses due to her digital deformity and pressure No other open lesions or pre-ulcerative lesions identified. HAV is present. No pain with calf compression, swelling, warmth, erythema      Assessment: Chronic ulceration left foot without signs of infection today.  Plan: -All treatment options discussed with the patient including all alternatives, risks, complications.  -After verbal consent was obtained the wound was sharply debrided today without any complications to healthy, granular tissue.  The wound is larger today.  There is a small amount of hyper granulation tissue with silver nitrate is applied.  Continue Betadine wet-to-dry dressing changes for  now.  Ultimately this is a very difficult wound to heal given the underlying digital deformity and the pressure.  We can discuss surgical intervention at this time I recommended this again today.  She is very hesitant to do this because she has to work and she did not take time off of work.  We discussed the wound care referral.  We will going up to the wound care referral in place for her shoes and consider surgical intervention.  I will see her back in 10 days.  I discussed with her that if she could not afford the co-pay although still will see her because ultimately we need consistent follow-up on a regular basis in order to get this wound to heal.  Trula Slade DPM

## 2017-04-17 ENCOUNTER — Telehealth: Payer: Self-pay | Admitting: *Deleted

## 2017-04-17 DIAGNOSIS — L97522 Non-pressure chronic ulcer of other part of left foot with fat layer exposed: Secondary | ICD-10-CM

## 2017-04-17 NOTE — Telephone Encounter (Signed)
-----   Message from Trula Slade, DPM sent at 04/16/2017  4:13 PM EST ----- Can you please put in for a wound care referral for her due to chronic nonhealing ulcer left foot? Thanks.

## 2017-04-17 NOTE — Telephone Encounter (Signed)
Required form, Clinicals and demographics faxed to [place].

## 2017-04-28 ENCOUNTER — Ambulatory Visit: Payer: PPO | Admitting: Podiatry

## 2017-04-28 DIAGNOSIS — L97522 Non-pressure chronic ulcer of other part of left foot with fat layer exposed: Secondary | ICD-10-CM | POA: Diagnosis not present

## 2017-05-03 NOTE — Progress Notes (Signed)
Subjective: [name] presents the office they for follow-up evaluation of a wound to left foot.  She is not sure how the wound is doing.  She has been putting Medihoney on the wound daily.  She denies any drainage or pus coming from it.  She denies any increase in swelling or redness to her feet.  She has no other concerns today.  She has remained in the surgical shoe.  Denies any systemic complaints such as fevers, chills, nausea, vomiting. No acute changes since last appointment, and no other complaints at this time.   Objective: AAO x3, NAD DP/PT pulses palpable bilaterally, CRT less than 3 seconds HAV is present. This prominence the metatarsal heads plantarly On the plantar medial aspect of the right first MTPJ is an ulceration which does continue   However the wound appears to be much improved after debridement.  The wound after debridement today measures 1 x 0.1 x 0.1 cm.  Appears to be almost a fissure type woun area there is no probing to bone, undermining or tunneling.  There is no surrounding erythema, ascending cellulitis.  There is no fluctuation or crepitation.  There is no malodor.   No other open lesions or pre-ulcerative lesions identified. HAV is present. No pain with calf compression, swelling, warmth, erythema  After debridement:   Before debridement:       Assessment: Chronic ulceration left foot without signs of infection today.  Plan: -All treatment options discussed with the patient including all alternatives, risks, complications.  -After verbal consent was obtained the wound was sharply debrided today without any complications to healthy, granular tissue.  The wound is is improved today.  There is no signs of infection.  She tolerated the procedure well any complications.  Continue with Medihoney to the wound daily followed by dry sterile dressing.  She also appointment with wound care center schedule as well.  Again discussed surgical intervention.  I will see her  back in 4 weeks as she is going to the wound care center in 2 weeks.  If she has before that she can call.  However discussed that she states that the wound care center then we can continue to follow-up with them.  -Monitor for any clinical signs or symptoms of infection and directed to call the office immediately should any occur or go to the ER.  Trula Slade DPM

## 2017-05-10 ENCOUNTER — Encounter (HOSPITAL_BASED_OUTPATIENT_CLINIC_OR_DEPARTMENT_OTHER): Payer: PPO | Attending: Physician Assistant

## 2017-05-10 DIAGNOSIS — M21612 Bunion of left foot: Secondary | ICD-10-CM | POA: Insufficient documentation

## 2017-05-10 DIAGNOSIS — G4730 Sleep apnea, unspecified: Secondary | ICD-10-CM | POA: Insufficient documentation

## 2017-05-10 DIAGNOSIS — E11621 Type 2 diabetes mellitus with foot ulcer: Secondary | ICD-10-CM | POA: Diagnosis not present

## 2017-05-10 DIAGNOSIS — L97522 Non-pressure chronic ulcer of other part of left foot with fat layer exposed: Secondary | ICD-10-CM | POA: Diagnosis not present

## 2017-05-10 DIAGNOSIS — E1140 Type 2 diabetes mellitus with diabetic neuropathy, unspecified: Secondary | ICD-10-CM | POA: Diagnosis not present

## 2017-05-10 DIAGNOSIS — R531 Weakness: Secondary | ICD-10-CM | POA: Insufficient documentation

## 2017-05-10 DIAGNOSIS — Z89422 Acquired absence of other left toe(s): Secondary | ICD-10-CM | POA: Diagnosis not present

## 2017-05-10 DIAGNOSIS — Z89411 Acquired absence of right great toe: Secondary | ICD-10-CM | POA: Insufficient documentation

## 2017-05-10 DIAGNOSIS — Z794 Long term (current) use of insulin: Secondary | ICD-10-CM | POA: Diagnosis not present

## 2017-05-10 DIAGNOSIS — R2689 Other abnormalities of gait and mobility: Secondary | ICD-10-CM | POA: Insufficient documentation

## 2017-05-10 DIAGNOSIS — I10 Essential (primary) hypertension: Secondary | ICD-10-CM | POA: Insufficient documentation

## 2017-05-11 ENCOUNTER — Encounter (HOSPITAL_BASED_OUTPATIENT_CLINIC_OR_DEPARTMENT_OTHER): Payer: PPO

## 2017-05-11 DIAGNOSIS — I129 Hypertensive chronic kidney disease with stage 1 through stage 4 chronic kidney disease, or unspecified chronic kidney disease: Secondary | ICD-10-CM | POA: Diagnosis not present

## 2017-05-11 DIAGNOSIS — I7300 Raynaud's syndrome without gangrene: Secondary | ICD-10-CM | POA: Diagnosis not present

## 2017-05-11 DIAGNOSIS — Z89411 Acquired absence of right great toe: Secondary | ICD-10-CM | POA: Diagnosis not present

## 2017-05-11 DIAGNOSIS — N183 Chronic kidney disease, stage 3 (moderate): Secondary | ICD-10-CM | POA: Diagnosis not present

## 2017-05-11 DIAGNOSIS — E785 Hyperlipidemia, unspecified: Secondary | ICD-10-CM | POA: Diagnosis not present

## 2017-05-11 DIAGNOSIS — F3342 Major depressive disorder, recurrent, in full remission: Secondary | ICD-10-CM | POA: Diagnosis not present

## 2017-05-17 DIAGNOSIS — L97522 Non-pressure chronic ulcer of other part of left foot with fat layer exposed: Secondary | ICD-10-CM | POA: Insufficient documentation

## 2017-05-17 DIAGNOSIS — L84 Corns and callosities: Secondary | ICD-10-CM | POA: Diagnosis not present

## 2017-05-17 DIAGNOSIS — E11621 Type 2 diabetes mellitus with foot ulcer: Secondary | ICD-10-CM | POA: Diagnosis not present

## 2017-05-26 ENCOUNTER — Ambulatory Visit: Payer: PPO | Admitting: Podiatry

## 2017-05-29 DIAGNOSIS — E1142 Type 2 diabetes mellitus with diabetic polyneuropathy: Secondary | ICD-10-CM | POA: Diagnosis not present

## 2017-05-29 DIAGNOSIS — H52223 Regular astigmatism, bilateral: Secondary | ICD-10-CM | POA: Diagnosis not present

## 2017-05-29 DIAGNOSIS — Z961 Presence of intraocular lens: Secondary | ICD-10-CM | POA: Diagnosis not present

## 2017-05-29 DIAGNOSIS — Z794 Long term (current) use of insulin: Secondary | ICD-10-CM | POA: Diagnosis not present

## 2017-05-29 DIAGNOSIS — Z89411 Acquired absence of right great toe: Secondary | ICD-10-CM | POA: Diagnosis not present

## 2017-05-30 ENCOUNTER — Other Ambulatory Visit: Payer: PPO

## 2017-05-30 ENCOUNTER — Ambulatory Visit: Payer: PPO | Admitting: Podiatry

## 2017-05-30 DIAGNOSIS — M2042 Other hammer toe(s) (acquired), left foot: Secondary | ICD-10-CM | POA: Diagnosis not present

## 2017-05-30 DIAGNOSIS — L84 Corns and callosities: Secondary | ICD-10-CM

## 2017-05-30 DIAGNOSIS — E1149 Type 2 diabetes mellitus with other diabetic neurological complication: Secondary | ICD-10-CM | POA: Diagnosis not present

## 2017-05-30 DIAGNOSIS — M2041 Other hammer toe(s) (acquired), right foot: Secondary | ICD-10-CM | POA: Diagnosis not present

## 2017-05-30 DIAGNOSIS — L97522 Non-pressure chronic ulcer of other part of left foot with fat layer exposed: Secondary | ICD-10-CM | POA: Diagnosis not present

## 2017-05-30 DIAGNOSIS — M2012 Hallux valgus (acquired), left foot: Secondary | ICD-10-CM

## 2017-06-02 NOTE — Progress Notes (Addendum)
Subjective: [name] presents the office today for follow-up evaluation of left foot ulceration.  Since I last saw her she is been treated with wound care center and the wound is been healed and she has been released.  However still a callus along the area and she presents today to discuss further shoe, insert options to help prevent reoccurrence of the wound.  She denies any redness or swelling or any drainage or pus.  Overall she feels well and she denies any systemic complaints such as fevers, chills, nausea, vomiting. No acute changes since last appointment, and no other complaints at this time.   History of partial 3rd toe amputation on left  Objective: AAO x3, NAD DP/PT pulses palpable bilaterally 2/4, CRT less than 3 seconds Sensation decreased with Simms Weinstein monofilament. Significant HAV is present in the left foot.  Hyperkeratotic pre-ulcerative lesion on the plantar medial aspect of the first metatarsal.  Upon debridement the underlying ulceration appears to be healed and there is no drainage or pus.  There is no surrounding erythema, ascending sialitis.  There is no fluctuation or crepitation.  There is no malodor.  Contractures are present as well. No open lesions or pre-ulcerative lesions.  No pain with calf compression, swelling, warmth, erythema  Assessment: Healed ulceration left foot with significant digital deformity, HAV  Plan: -All treatment options discussed with the patient including all alternatives, risks, complications.  -I debrided the hyperkeratotic lesion today without any complications or bleeding.  Appears that the wound is healed however discussed with her to continue offloading shoe at all times.  I think she will benefit from a diabetic shoe with insert given her deformity as well as neuropathy and history of ulceration.  Paperwork was completed for precertification of this.  She will be seen by Liliane Channel or Benjie Karvonen for molding. -Daily foot inspection discussed. -I  do see her every 3 months for diabetic foot check or sooner if there is any issues are to arise.  She agrees with this plan. -Patient encouraged to call the office with any questions, concerns, change in symptoms.   Trula Slade DPM

## 2017-06-12 DIAGNOSIS — R002 Palpitations: Secondary | ICD-10-CM | POA: Diagnosis not present

## 2017-06-19 ENCOUNTER — Telehealth: Payer: Self-pay

## 2017-06-19 NOTE — Telephone Encounter (Signed)
Notes faxed to [facility] office.

## 2017-07-11 ENCOUNTER — Other Ambulatory Visit: Payer: PPO | Admitting: Orthotics

## 2017-07-12 ENCOUNTER — Ambulatory Visit (INDEPENDENT_AMBULATORY_CARE_PROVIDER_SITE_OTHER): Payer: PPO | Admitting: Orthotics

## 2017-07-12 DIAGNOSIS — Z794 Long term (current) use of insulin: Secondary | ICD-10-CM | POA: Diagnosis not present

## 2017-07-12 DIAGNOSIS — E1165 Type 2 diabetes mellitus with hyperglycemia: Secondary | ICD-10-CM | POA: Diagnosis not present

## 2017-07-12 DIAGNOSIS — L97522 Non-pressure chronic ulcer of other part of left foot with fat layer exposed: Secondary | ICD-10-CM | POA: Diagnosis not present

## 2017-07-12 DIAGNOSIS — Z6839 Body mass index (BMI) 39.0-39.9, adult: Secondary | ICD-10-CM | POA: Diagnosis not present

## 2017-07-12 DIAGNOSIS — I7300 Raynaud's syndrome without gangrene: Secondary | ICD-10-CM | POA: Diagnosis not present

## 2017-07-12 DIAGNOSIS — R5383 Other fatigue: Secondary | ICD-10-CM | POA: Diagnosis not present

## 2017-07-12 DIAGNOSIS — M79642 Pain in left hand: Secondary | ICD-10-CM | POA: Diagnosis not present

## 2017-07-12 DIAGNOSIS — M6530 Trigger finger, unspecified finger: Secondary | ICD-10-CM | POA: Diagnosis not present

## 2017-07-12 DIAGNOSIS — E669 Obesity, unspecified: Secondary | ICD-10-CM | POA: Diagnosis not present

## 2017-07-12 NOTE — Progress Notes (Signed)

## 2017-07-19 ENCOUNTER — Ambulatory Visit (INDEPENDENT_AMBULATORY_CARE_PROVIDER_SITE_OTHER): Payer: PPO | Admitting: Internal Medicine

## 2017-07-19 ENCOUNTER — Encounter: Payer: Self-pay | Admitting: Internal Medicine

## 2017-07-19 ENCOUNTER — Encounter

## 2017-07-19 VITALS — BP 122/62 | HR 65 | Ht 69.5 in | Wt 268.2 lb

## 2017-07-19 DIAGNOSIS — E782 Mixed hyperlipidemia: Secondary | ICD-10-CM | POA: Diagnosis not present

## 2017-07-19 DIAGNOSIS — R002 Palpitations: Secondary | ICD-10-CM

## 2017-07-19 DIAGNOSIS — I10 Essential (primary) hypertension: Secondary | ICD-10-CM | POA: Diagnosis not present

## 2017-07-19 DIAGNOSIS — R000 Tachycardia, unspecified: Secondary | ICD-10-CM | POA: Insufficient documentation

## 2017-07-19 HISTORY — DX: Tachycardia, unspecified: R00.0

## 2017-07-19 NOTE — Progress Notes (Signed)
OFFICE CONSULT NOTE  Chief Complaint:  Palpitations, history of Takatsubo's cardiomyopathy  Primary Care Physician: Holly Stains, MD  HPI:  [name] is a 67 y.o. female who is being seen today for the evaluation of palpitations at the request of [place], Earlie Server, [place].  [name] is referred today for evaluation of palpitations and tachycardia.  She reports episodes of racing heartbeat at night, 3 of which she can recall specifically, however they occurred last December and she has not had any more episodes in the past 5 months.  She has remote history of Takatsubo cardiomyopathy.  Apparently this was a diagnosis she received in 2009 when she presented with chest pain.  She says she had a heart catheterization in 2009 and was told she did not have any coronary disease.  She felt that this was at [facility], but I cannot find records.  I can also not find records of her prior echocardiogram.  She said she saw a cardiologist at [facility], but he is no longer practicing there.  She denies any chest pain or worsening shortness of breath.  Recent labs were reviewed from February 2019 which showed total cholesterol 166, HDL 48, LDL 80 and triglycerides 190.  Hemoglobin A1c was 6.4.  PMHx:  Past Medical History:  Diagnosis Date  . Chronic kidney disease   . Depression   . Diabetes mellitus without complication ([place])   . Dyslipidemia   . Endometrial adenocarcinoma ([place])   . Esophageal reflux   . Fever blister   . Hyperlipidemia   . Hypertension   . OSA (obstructive sleep apnea)   . Peripheral neuropathy   . Retinopathy   . Severe obesity ([place])     Past Surgical History:  Procedure Laterality Date  . ADRENALECTOMY    . BUNIONECTOMY    . CARDIAC CATHETERIZATION    . CERVICAL SPINE SURGERY    . CHOLECYSTECTOMY    . LAPAROSCOPIC SALPINGOOPHERECTOMY    . THORACOTOMY    . TONSILLECTOMY      FAMHx:  Family History  Problem Relation Age of Onset  . Hypertension Mother   .  Dementia Mother   . Heart attack Father   . Cancer Father   . CAD Father   . Dementia Father   . Diverticulitis Sister   . Obesity Sister   . Hypertension Sister   . Voice disorder Brother     SOCHx:   reports that she has never smoked. She has never used smokeless tobacco. She reports that she does not drink alcohol or use drugs.  ALLERGIES:  Allergies  Allergen Reactions  . Ciprofloxacin Other (See Comments)    Unknown  . Penicillins Other (See Comments)    Unknown  . Sulfa Antibiotics   . Sulfamethoxazole Other (See Comments)    Unknown    ROS: Pertinent items noted in HPI and remainder of comprehensive ROS otherwise negative.  HOME MEDS: Current Outpatient Medications on File Prior to Visit  Medication Sig Dispense Refill  . aspirin 325 MG tablet Take 325 mg by mouth daily.    . Calcium Carb-Cholecalciferol (CALCIUM 600 + D PO) Take by mouth.    . carvedilol (COREG) 3.125 MG tablet Take 1 tablet twice a day for blood pressure    . insulin NPH-regular Human (NOVOLIN 70/30) (70-30) 100 UNIT/ML injection Inject into the skin. 24 ml in the morning and 20 ml in the evening    . losartan (COZAAR) 100 MG tablet Take 100 mg by  mouth daily.     . metFORMIN (GLUCOPHAGE) 500 MG tablet Take by mouth 2 (two) times daily with a meal.    . pravastatin (PRAVACHOL) 40 MG tablet Take 1 tablet once a day for cholesterol     No current facility-administered medications on file prior to visit.     LABS/IMAGING: No results found for this or any previous visit (from the past 48 hour(s)). No results found.  LIPID PANEL:    Component Value Date/Time   CHOL  07/31/2007 0930    117        ATP III CLASSIFICATION:  <200     mg/dL   Desirable  200-239  mg/dL   Borderline High  >=240    mg/dL   High   TRIG 211 (H) 07/31/2007 0930   HDL 29 (L) 07/31/2007 0930   CHOLHDL 4.0 07/31/2007 0930   VLDL 42 (H) 07/31/2007 0930   LDLCALC  07/31/2007 0930    46        Total Cholesterol/HDL:CHD  Risk Coronary Heart Disease Risk Table                     Men   Women  1/2 Average Risk   3.4   3.3    WEIGHTS: Wt Readings from Last 3 Encounters:  07/19/17 268 lb 3.2 oz (121.7 kg)  08/06/14 255 lb (115.7 kg)    VITALS: BP 122/62   Pulse 65   Ht 5' 9.5" (1.765 m)   Wt 268 lb 3.2 oz (121.7 kg)   BMI 39.04 kg/m   EXAM: General appearance: alert, no distress and moderately obese Neck: no carotid bruit, no JVD and thyroid not enlarged, symmetric, no tenderness/mass/nodules Lungs: clear to auscultation bilaterally Heart: regular rate and rhythm Abdomen: soft, non-tender; bowel sounds normal; no masses,  no organomegaly Extremities: extremities normal, atraumatic, no cyanosis or edema Pulses: 2+ and symmetric Skin: Skin color, texture, turgor normal. No rashes or lesions Neurologic: Grossly normal Psych: Pleasant  EKG: Normal sinus rhythm at 65- personally reviewed  ASSESSMENT: 1. Palpitations/tachycardia 2. History of Takatsubo cardiomyopathy 3. Possible heart cath-no known coronary disease 4. Hypertension 5. Dyslipidemia 6. Type 2 diabetes on insulin 7. Near morbid obesity  PLAN: 1.   [name] had palpitations and racing heartbeat, however they occurred last December and she has had no further episodes in the past 5 months.  She has a remote history of Takatsubo cardiomyopathy.  I cannot find records about her heart catheterization.  Since she is asymptomatic at this time, I would recommend continuing to monitor her symptoms.  If she has recurrent palpitations we can consider monitoring.  Otherwise per her request we will see her back annually or sooner as necessary.  Thanks again for the kind referral.  Pixie Casino, MD, FACC, [place] Director of the [facility]  [facility] Diplomate of the American Board of Clinical Lipidology Attending Cardiologist  Direct Dial: [phone]   Fax: [phone]  Website:  [email or web address] 07/19/2017, 5:37 PM

## 2017-07-19 NOTE — Patient Instructions (Signed)
Your physician wants you to follow-up in: ONE YEAR with Dr. Hilty. You will receive a reminder letter in the mail two months in advance. If you don't receive a letter, please call our office to schedule the follow-up appointment.  

## 2017-08-04 ENCOUNTER — Encounter: Payer: Self-pay | Admitting: Podiatry

## 2017-08-04 ENCOUNTER — Ambulatory Visit (INDEPENDENT_AMBULATORY_CARE_PROVIDER_SITE_OTHER): Payer: PPO | Admitting: Podiatry

## 2017-08-04 DIAGNOSIS — L84 Corns and callosities: Secondary | ICD-10-CM | POA: Diagnosis not present

## 2017-08-04 DIAGNOSIS — E1165 Type 2 diabetes mellitus with hyperglycemia: Secondary | ICD-10-CM | POA: Diagnosis not present

## 2017-08-04 DIAGNOSIS — Z794 Long term (current) use of insulin: Secondary | ICD-10-CM

## 2017-08-07 NOTE — Progress Notes (Signed)
Subjective: Denecia presents the office today for follow-up evaluation of left foot pre-ulcerative callus.  Since last open she states that she will keep moisturizer on the area as well as use a pumice stone if it gets thick but she has not had any skin breakdown she states there is been very well.  She denies any swelling or redness or any new sores present.  Objective: AAO x3, NAD DP/PT pulses palpable bilaterally 2/4, CRT less than 3 seconds Sensation decreased with Simms Weinstein monofilament. Significant HAV is present in the left foot.  Hyperkeratotic pre-ulcerative lesion on the plantar medial aspect of the first metatarsal.  Upon debridement there is no underlying ulceration, drainage or any clinical signs of infection there is no recurrence of the ulceration. There is no surrounding erythema, ascending cellulitis.  There is no fluctuation or crepitation.  There is no malodor.  Contractures are present as well. No open lesions or pre-ulcerative lesions.  No pain with calf compression, swelling, warmth, erythema  Assessment: Healed ulceration left foot with significant digital deformity, HAV  Plan: -All treatment options discussed with the patient including all alternatives, risks, complications.  -I debrided the hyperkeratotic lesion today without any complications or bleeding.  Wound is healed and there is no recurrence.  Have her continue to monitor closely for any skin breakdown or any recurrence.   -Daily foot inspection discussed. -I do see her every 3 months for diabetic foot check or sooner if there is any issues are to arise.  She agrees with this plan. -Patient encouraged to call the office with any questions, concerns, change in symptoms.   Trula Slade DPM

## 2017-09-05 DIAGNOSIS — E1140 Type 2 diabetes mellitus with diabetic neuropathy, unspecified: Secondary | ICD-10-CM | POA: Diagnosis not present

## 2017-09-05 DIAGNOSIS — R600 Localized edema: Secondary | ICD-10-CM | POA: Diagnosis not present

## 2017-09-05 DIAGNOSIS — Z89421 Acquired absence of other right toe(s): Secondary | ICD-10-CM | POA: Diagnosis not present

## 2017-09-12 DIAGNOSIS — R609 Edema, unspecified: Secondary | ICD-10-CM | POA: Diagnosis not present

## 2017-09-12 DIAGNOSIS — M7989 Other specified soft tissue disorders: Secondary | ICD-10-CM | POA: Diagnosis not present

## 2017-09-12 DIAGNOSIS — Z79899 Other long term (current) drug therapy: Secondary | ICD-10-CM | POA: Diagnosis not present

## 2017-09-13 ENCOUNTER — Other Ambulatory Visit: Payer: Self-pay | Admitting: Family Medicine

## 2017-09-13 ENCOUNTER — Ambulatory Visit
Admission: RE | Admit: 2017-09-13 | Discharge: 2017-09-13 | Disposition: A | Discharge status: Home / Self Care | Payer: PPO | Source: Ambulatory Visit | Attending: Family Medicine | Admitting: Family Medicine

## 2017-09-13 DIAGNOSIS — M7989 Other specified soft tissue disorders: Secondary | ICD-10-CM

## 2017-09-13 DIAGNOSIS — M7122 Synovial cyst of popliteal space [Baker], left knee: Secondary | ICD-10-CM | POA: Diagnosis not present

## 2017-11-06 ENCOUNTER — Ambulatory Visit: Payer: PPO | Admitting: Podiatry

## 2017-11-06 ENCOUNTER — Encounter: Payer: Self-pay | Admitting: Podiatry

## 2017-11-06 DIAGNOSIS — B351 Tinea unguium: Secondary | ICD-10-CM

## 2017-11-06 DIAGNOSIS — M79674 Pain in right toe(s): Secondary | ICD-10-CM

## 2017-11-06 DIAGNOSIS — L84 Corns and callosities: Secondary | ICD-10-CM

## 2017-11-06 DIAGNOSIS — E1165 Type 2 diabetes mellitus with hyperglycemia: Secondary | ICD-10-CM | POA: Diagnosis not present

## 2017-11-06 DIAGNOSIS — Z794 Long term (current) use of insulin: Secondary | ICD-10-CM

## 2017-11-06 DIAGNOSIS — M79675 Pain in left toe(s): Secondary | ICD-10-CM | POA: Diagnosis not present

## 2017-11-08 NOTE — Progress Notes (Signed)
Subjective: Azuree presents the office today for follow-up evaluation of left foot pre-ulcerative callus.  She states the area is doing well she does keep moisturizing the area and she finds herself.  She also states her nails are elongated and she is having difficulty trimming them.  She is been having some swelling to her feet.  She had a venous duplex which was normal apparently she was given some Lasix to help.  She has no concerns today.  Denies any redness or drainage or any open sores.  She has no other concerns.   Objective: AAO x3, NAD DP/PT pulses palpable bilaterally 2/4, CRT less than 3 seconds Bilateral chronic appearing edema Amputation left third toe Sensation decreased with Simms Weinstein monofilament. Significant HAV is present in the left foot.  Hyperkeratotic pre-ulcerative lesion on the plantar medial aspect of the first metatarsal.  There is minimal hyperkeratotic tissue today.  There is no other significant pre-ulcerative callus and there is no open lesions identified at this time.  There is no edema no erythema to her feet.  Nails are mildly hypertrophic, dystrophic with ill-defined discoloration.  There is no swelling redness or drainage.  Subjective tenderness to the toenails No open lesions or pre-ulcerative lesions.  No pain with calf compression, swelling, warmth, erythema  Assessment: Healed ulceration left foot with significant digital deformity, HAV; symptomatic onychomycosis  Plan: -All treatment options discussed with the patient including all alternatives, risks, complications.  -I debrided the hyperkeratotic lesion today without any complications or bleeding.  There is minimal hyperkeratotic tissue today.  There is no signs of ulceration but continue to monitor daily.  I would recommend her not current treatment herself.  Moisturizer daily.  -Nails debrided x9 without any complications or bleeding.   Trula Slade DPM

## 2017-11-27 DIAGNOSIS — Z0000 Encounter for general adult medical examination without abnormal findings: Secondary | ICD-10-CM | POA: Diagnosis not present

## 2017-11-27 DIAGNOSIS — N183 Chronic kidney disease, stage 3 (moderate): Secondary | ICD-10-CM | POA: Diagnosis not present

## 2017-11-27 DIAGNOSIS — E2839 Other primary ovarian failure: Secondary | ICD-10-CM | POA: Diagnosis not present

## 2017-11-27 DIAGNOSIS — L989 Disorder of the skin and subcutaneous tissue, unspecified: Secondary | ICD-10-CM | POA: Diagnosis not present

## 2017-11-27 DIAGNOSIS — Z89411 Acquired absence of right great toe: Secondary | ICD-10-CM | POA: Diagnosis not present

## 2017-11-27 DIAGNOSIS — E785 Hyperlipidemia, unspecified: Secondary | ICD-10-CM | POA: Diagnosis not present

## 2017-11-27 DIAGNOSIS — I129 Hypertensive chronic kidney disease with stage 1 through stage 4 chronic kidney disease, or unspecified chronic kidney disease: Secondary | ICD-10-CM | POA: Diagnosis not present

## 2017-11-27 DIAGNOSIS — Z23 Encounter for immunization: Secondary | ICD-10-CM | POA: Diagnosis not present

## 2017-11-27 DIAGNOSIS — H539 Unspecified visual disturbance: Secondary | ICD-10-CM | POA: Diagnosis not present

## 2017-11-27 DIAGNOSIS — F331 Major depressive disorder, recurrent, moderate: Secondary | ICD-10-CM | POA: Diagnosis not present

## 2017-12-07 DIAGNOSIS — Z79899 Other long term (current) drug therapy: Secondary | ICD-10-CM | POA: Diagnosis not present

## 2017-12-07 DIAGNOSIS — Z89411 Acquired absence of right great toe: Secondary | ICD-10-CM | POA: Diagnosis not present

## 2017-12-07 DIAGNOSIS — Z794 Long term (current) use of insulin: Secondary | ICD-10-CM | POA: Diagnosis not present

## 2017-12-07 DIAGNOSIS — E1142 Type 2 diabetes mellitus with diabetic polyneuropathy: Secondary | ICD-10-CM | POA: Diagnosis not present

## 2017-12-27 DIAGNOSIS — F325 Major depressive disorder, single episode, in full remission: Secondary | ICD-10-CM | POA: Diagnosis not present

## 2018-01-01 DIAGNOSIS — E2839 Other primary ovarian failure: Secondary | ICD-10-CM | POA: Diagnosis not present

## 2018-01-11 DIAGNOSIS — G43819 Other migraine, intractable, without status migrainosus: Secondary | ICD-10-CM | POA: Diagnosis not present

## 2018-01-11 DIAGNOSIS — H10413 Chronic giant papillary conjunctivitis, bilateral: Secondary | ICD-10-CM | POA: Diagnosis not present

## 2018-01-16 DIAGNOSIS — D485 Neoplasm of uncertain behavior of skin: Secondary | ICD-10-CM | POA: Diagnosis not present

## 2018-01-16 DIAGNOSIS — C4441 Basal cell carcinoma of skin of scalp and neck: Secondary | ICD-10-CM | POA: Diagnosis not present

## 2018-01-16 DIAGNOSIS — Z23 Encounter for immunization: Secondary | ICD-10-CM | POA: Diagnosis not present

## 2018-01-16 DIAGNOSIS — L821 Other seborrheic keratosis: Secondary | ICD-10-CM | POA: Diagnosis not present

## 2018-02-06 ENCOUNTER — Encounter: Payer: Self-pay | Admitting: Podiatry

## 2018-02-06 ENCOUNTER — Ambulatory Visit: Payer: PPO | Admitting: Podiatry

## 2018-02-06 DIAGNOSIS — M79675 Pain in left toe(s): Secondary | ICD-10-CM

## 2018-02-06 DIAGNOSIS — E1165 Type 2 diabetes mellitus with hyperglycemia: Secondary | ICD-10-CM | POA: Diagnosis not present

## 2018-02-06 DIAGNOSIS — B351 Tinea unguium: Secondary | ICD-10-CM

## 2018-02-06 DIAGNOSIS — Z794 Long term (current) use of insulin: Secondary | ICD-10-CM

## 2018-02-06 DIAGNOSIS — M79674 Pain in right toe(s): Secondary | ICD-10-CM | POA: Diagnosis not present

## 2018-02-08 NOTE — Progress Notes (Signed)
Subjective: Yamila presents the office today for follow-up evaluation of left foot pre-ulcerative callus and for tenderness that she has difficulty trimming.  She states that she tries to trim them herself she bleeds and causes issues.  She is otherwise she is been doing well she denies any open sores to her feet.  She is currently being treated for a skin cancer on her left leg but she has a bandage on.   Denies any redness or drainage or any open sores.  She has no other concerns.   Objective: AAO x3, NAD DP/PT pulses palpable bilaterally 2/4, CRT less than 3 seconds Bilateral chronic appearing edema Amputation left third toe Sensation decreased with Simms Weinstein monofilament. Significant HAV is present in the left foot.  Minimal hyperkeratotic pre-ulcerative lesion on the plantar medial aspect of the first metatarsal. Nails are mildly hypertrophic, dystrophic with ill-defined discoloration to the left first, second, fourth, right 2,3,4,5.  There is no swelling redness or drainage.  Subjective tenderness to the toenails No open lesions or pre-ulcerative lesions.  No pain with calf compression, swelling, warmth, erythema  Assessment: Healed ulceration left foot with significant digital deformity, HAV; symptomatic onychomycosis  Plan: -All treatment options discussed with the patient including all alternatives, risks, complications.  -Nails sharply debrided x7 without any complications or bleeding. -No significant hyperkeratotic tissue to debride today.  Recommend moisturizer daily. -Discussed importance of daily foot inspection.  Trula Slade DPM

## 2018-03-27 DIAGNOSIS — H2513 Age-related nuclear cataract, bilateral: Secondary | ICD-10-CM | POA: Diagnosis not present

## 2018-03-27 DIAGNOSIS — H5203 Hypermetropia, bilateral: Secondary | ICD-10-CM | POA: Diagnosis not present

## 2018-03-27 DIAGNOSIS — E113293 Type 2 diabetes mellitus with mild nonproliferative diabetic retinopathy without macular edema, bilateral: Secondary | ICD-10-CM | POA: Diagnosis not present

## 2018-03-27 DIAGNOSIS — H52223 Regular astigmatism, bilateral: Secondary | ICD-10-CM | POA: Diagnosis not present

## 2018-03-27 DIAGNOSIS — H524 Presbyopia: Secondary | ICD-10-CM | POA: Diagnosis not present

## 2018-04-05 DIAGNOSIS — C44719 Basal cell carcinoma of skin of left lower limb, including hip: Secondary | ICD-10-CM | POA: Diagnosis not present

## 2018-04-19 DIAGNOSIS — C4441 Basal cell carcinoma of skin of scalp and neck: Secondary | ICD-10-CM | POA: Diagnosis not present

## 2018-04-30 ENCOUNTER — Ambulatory Visit: Payer: PPO | Admitting: Podiatry

## 2018-04-30 ENCOUNTER — Encounter: Payer: Self-pay | Admitting: Podiatry

## 2018-04-30 DIAGNOSIS — E785 Hyperlipidemia, unspecified: Secondary | ICD-10-CM | POA: Diagnosis not present

## 2018-04-30 DIAGNOSIS — Z794 Long term (current) use of insulin: Secondary | ICD-10-CM | POA: Diagnosis not present

## 2018-04-30 DIAGNOSIS — E1165 Type 2 diabetes mellitus with hyperglycemia: Secondary | ICD-10-CM | POA: Diagnosis not present

## 2018-04-30 DIAGNOSIS — F3341 Major depressive disorder, recurrent, in partial remission: Secondary | ICD-10-CM | POA: Diagnosis not present

## 2018-04-30 DIAGNOSIS — E1140 Type 2 diabetes mellitus with diabetic neuropathy, unspecified: Secondary | ICD-10-CM | POA: Diagnosis not present

## 2018-04-30 DIAGNOSIS — I129 Hypertensive chronic kidney disease with stage 1 through stage 4 chronic kidney disease, or unspecified chronic kidney disease: Secondary | ICD-10-CM | POA: Diagnosis not present

## 2018-04-30 DIAGNOSIS — L97521 Non-pressure chronic ulcer of other part of left foot limited to breakdown of skin: Secondary | ICD-10-CM | POA: Diagnosis not present

## 2018-04-30 DIAGNOSIS — E11319 Type 2 diabetes mellitus with unspecified diabetic retinopathy without macular edema: Secondary | ICD-10-CM | POA: Diagnosis not present

## 2018-04-30 DIAGNOSIS — B372 Candidiasis of skin and nail: Secondary | ICD-10-CM | POA: Diagnosis not present

## 2018-04-30 DIAGNOSIS — N183 Chronic kidney disease, stage 3 (moderate): Secondary | ICD-10-CM | POA: Diagnosis not present

## 2018-04-30 MED ORDER — MUPIROCIN 2 % EX OINT: 1.0000 "application " | TOPICAL_OINTMENT | Freq: Two times a day (BID) | CUTANEOUS | 2 refills | Status: DC

## 2018-04-30 NOTE — Patient Instructions (Signed)
Keep antibiotic ointment and a bandage on your toe.  Monitor for any signs/symptoms of infection. Call the office immediately if any occur or go directly to the emergency room. Call with any questions/concerns.

## 2018-05-01 NOTE — Progress Notes (Signed)
Subjective: 68 year old female presents the office today for an acute appointment.  She has a states that on Sunday night she tried trimming her nails herself as they were getting long and she states that she cut her left fourth toe.  She says it bled quite a bit.  She presents today for evaluation of this.  She states the bleeding has stopped and not having any pain. Denies any systemic complaints such as fevers, chills, nausea, vomiting. No acute changes since last appointment, and no other complaints at this time.   Objective: AAO x3, NAD DP/PT pulses palpable bilaterally, CRT less than 3 seconds The distal aspect left fourth toe there is a superficial abrasion, laceration present.  There is some skin that is peeled off from she cut the skin at the distal portion of the toe.  I was able to debride the loose skin today and there was an ulceration measuring 0.4 x 0.3 cm and is superficial with a granular base.  There is no edema, erythema or any clinical signs of infection present.  No tenderness to palpation.  Hammertoe contractures are present.  No open lesions or pre-ulcerative lesions.  No pain with calf compression, swelling, warmth, erythema  Assessment: Ulceration left fourth toe  Plan: -All treatment options discussed with the patient including all alternatives, risks, complications.  -I did debride the skin that was loose today without any complications or bleeding.  There is no underlying wound present.  I want her to use mupirocin ointment dressing changes daily.  She is concerned about cost and she cannot afford this and will use regular antibiotic ointment.  The bandage on it at all times.  Continue with as she does not plan pressure to the area.  She has a surgical shoe at home that she can wear as well.  I strongly advised her not to cut her own toenails. -Patient encouraged to call the office with any questions, concerns, change in symptoms.   *Follow-up as scheduled for routine  care as well as to check the wound or sooner if any issues are to arise.  Trula Slade DPM

## 2018-05-03 ENCOUNTER — Ambulatory Visit: Payer: PPO | Admitting: Podiatry

## 2018-05-10 ENCOUNTER — Ambulatory Visit: Payer: PPO | Admitting: Podiatry

## 2018-05-17 ENCOUNTER — Ambulatory Visit: Payer: PPO | Admitting: Podiatry

## 2018-05-17 ENCOUNTER — Other Ambulatory Visit: Payer: Self-pay

## 2018-05-17 ENCOUNTER — Encounter: Payer: Self-pay | Admitting: Podiatry

## 2018-05-17 DIAGNOSIS — B351 Tinea unguium: Secondary | ICD-10-CM

## 2018-05-17 DIAGNOSIS — E1142 Type 2 diabetes mellitus with diabetic polyneuropathy: Secondary | ICD-10-CM | POA: Diagnosis not present

## 2018-05-17 DIAGNOSIS — M79675 Pain in left toe(s): Secondary | ICD-10-CM

## 2018-05-17 DIAGNOSIS — M79674 Pain in right toe(s): Secondary | ICD-10-CM | POA: Diagnosis not present

## 2018-05-17 DIAGNOSIS — L84 Corns and callosities: Secondary | ICD-10-CM

## 2018-05-17 NOTE — Patient Instructions (Signed)

## 2018-05-26 NOTE — Progress Notes (Signed)
Subjective: [name] presents for 2 week follow up left 4th toe ulceration sustained when attempting to cut her own toenails and also for diabetic preventative foot care.   Pain is aggravated when wearing enclosed shoe gear. Pain is relieved with periodic professional debridement.  [name], MD is her PCP.  She has area on left lower leg healing from skin cancer excision by Dr. Danella Penton. She states it's continuing to heal without complication.   Current Outpatient Medications:  .  aspirin 325 MG tablet, Take 325 mg by mouth daily., Disp: , Rfl:  .  buPROPion (WELLBUTRIN XL) 150 MG 24 hr tablet, Take 150 mg by mouth every morning., Disp: , Rfl: 0 .  Calcium Carb-Cholecalciferol (CALCIUM 600 + D PO), Take by mouth., Disp: , Rfl:  .  carvedilol (COREG) 3.125 MG tablet, Take 1 tablet twice a day for blood pressure, Disp: , Rfl:  .  FLUoxetine (PROZAC) 20 MG capsule, Take by mouth daily., Disp: , Rfl: 0 .  furosemide (LASIX) 20 MG tablet, , Disp: , Rfl:  .  insulin NPH-regular Human (NOVOLIN 70/30) (70-30) 100 UNIT/ML injection, Inject into the skin. 24 ml in the morning and 20 ml in the evening, Disp: , Rfl:  .  losartan (COZAAR) 100 MG tablet, Take 100 mg by mouth daily. , Disp: , Rfl:  .  metFORMIN (GLUCOPHAGE-XR) 500 MG 24 hr tablet, , Disp: , Rfl:  .  mupirocin ointment (BACTROBAN) 2 %, Apply 1 application topically 2 (two) times daily., Disp: 30 g, Rfl: 2 .  nystatin cream (MYCOSTATIN), 1 APPLICATION TWICE A DAY EXTERNALLY, Disp: , Rfl:  .  ONETOUCH VERIO test strip, , Disp: , Rfl:  .  pravastatin (PRAVACHOL) 40 MG tablet, Take 1 tablet once a day for cholesterol, Disp: , Rfl:   Allergies  Allergen Reactions  . Ciprofloxacin Other (See Comments)    Unknown  . Penicillins Other (See Comments)    Unknown  . Sulfa Antibiotics   . Sulfamethoxazole Other (See Comments)    Unknown    Vascular Examination: Capillary refill time <3 seconds x 10 digits.  Dorsalis pedis and  Posterior tibial pulses present b/l.  No digital hair x 10 digits.  Skin temperature WNL b/l.  Dermatological Examination: Skin with normal turgor, texture and tone b/l.  Toenails 1-5 b/l discolored, thick, dystrophic with subungual debris and pain with palpation to nailbeds due to thickness of nails.  Hyperkeratotic lesion distal tip left 4th digit. No erythema, no edema, no drainage.  Musculoskeletal: Muscle strength 5/5 to all LE muscle groups  Neurological: Sensation diminished with 10 gram monofilament. Vibratory sensation diminished  Assessment: 1. Painful onychomycosis toenails 1-5 b/l 2. Corn distal tip left 3rd digit 3. NIDDM with Diabetic neuropathy  Plan: 1. Continue diabetic foot care principles. Literature dispensed on today.  2. Toenails 1-5 b/l were debrided in length and girth without iatrogenic bleeding. 3. Hyperkeratotic lesion pared left 3rd digit without incident. 4. Patient to continue soft, supportive shoe gear. 5. Patient to report any pedal injuries to medical professional  6. Follow up 9 weeks.  7. Patient/POA to call should there be a concern in the interim.

## 2018-07-19 ENCOUNTER — Other Ambulatory Visit: Payer: Self-pay

## 2018-07-19 ENCOUNTER — Ambulatory Visit: Payer: PPO | Admitting: Podiatry

## 2018-07-19 ENCOUNTER — Encounter: Payer: Self-pay | Admitting: Podiatry

## 2018-07-19 VITALS — Temp 95.2°F

## 2018-07-19 DIAGNOSIS — E1142 Type 2 diabetes mellitus with diabetic polyneuropathy: Secondary | ICD-10-CM | POA: Diagnosis not present

## 2018-07-19 DIAGNOSIS — B351 Tinea unguium: Secondary | ICD-10-CM | POA: Diagnosis not present

## 2018-07-19 NOTE — Patient Instructions (Signed)
Diabetes Mellitus and Foot Care Foot care is an important part of your health, especially when you have diabetes. Diabetes may cause you to have problems because of poor blood flow (circulation) to your feet and legs, which can cause your skin to:  Become thinner and drier.  Break more easily.  Heal more slowly.  Peel and crack. You may also have nerve damage (neuropathy) in your legs and feet, causing decreased feeling in them. This means that you may not notice minor injuries to your feet that could lead to more serious problems. Noticing and addressing any potential problems early is the best way to prevent future foot problems. How to care for your feet Foot hygiene  Wash your feet daily with warm water and mild soap. Do not use hot water. Then, pat your feet and the areas between your toes until they are completely dry. Do not soak your feet as this can dry your skin.  Trim your toenails straight across. Do not dig under them or around the cuticle. File the edges of your nails with an emery board or nail file.  Apply a moisturizing lotion or petroleum jelly to the skin on your feet and to dry, brittle toenails. Use lotion that does not contain alcohol and is unscented. Do not apply lotion between your toes. Shoes and socks  Wear clean socks or stockings every day. Make sure they are not too tight. Do not wear knee-high stockings since they may decrease blood flow to your legs.  Wear shoes that fit properly and have enough cushioning. Always look in your shoes before you put them on to be sure there are no objects inside.  To break in new shoes, wear them for just a few hours a day. This prevents injuries on your feet. Wounds, scrapes, corns, and calluses  Check your feet daily for blisters, cuts, bruises, sores, and redness. If you cannot see the bottom of your feet, use a mirror or ask someone for help.  Do not cut corns or calluses or try to remove them with medicine.  If you  find a minor scrape, cut, or break in the skin on your feet, keep it and the skin around it clean and dry. You may clean these areas with mild soap and water. Do not clean the area with peroxide, alcohol, or iodine.  If you have a wound, scrape, corn, or callus on your foot, look at it several times a day to make sure it is healing and not infected. Check for: ? Redness, swelling, or pain. ? Fluid or blood. ? Warmth. ? Pus or a bad smell. General instructions  Do not cross your legs. This may decrease blood flow to your feet.  Do not use heating pads or hot water bottles on your feet. They may burn your skin. If you have lost feeling in your feet or legs, you may not know this is happening until it is too late.  Protect your feet from hot and cold by wearing shoes, such as at the beach or on hot pavement.  Schedule a complete foot exam at least once a year (annually) or more often if you have foot problems. If you have foot problems, report any cuts, sores, or bruises to your health care provider immediately. Contact a health care provider if:  You have a medical condition that increases your risk of infection and you have any cuts, sores, or bruises on your feet.  You have an injury that is not   healing.  You have redness on your legs or feet.  You feel burning or tingling in your legs or feet.  You have pain or cramps in your legs and feet.  Your legs or feet are numb.  Your feet always feel cold.  You have pain around a toenail. Get help right away if:  You have a wound, scrape, corn, or callus on your foot and: ? You have pain, swelling, or redness that gets worse. ? You have fluid or blood coming from the wound, scrape, corn, or callus. ? Your wound, scrape, corn, or callus feels warm to the touch. ? You have pus or a bad smell coming from the wound, scrape, corn, or callus. ? You have a fever. ? You have a red line going up your leg. Summary  Check your feet every day  for cuts, sores, red spots, swelling, and blisters.  Moisturize feet and legs daily.  Wear shoes that fit properly and have enough cushioning.  If you have foot problems, report any cuts, sores, or bruises to your health care provider immediately.  Schedule a complete foot exam at least once a year (annually) or more often if you have foot problems. This information is not intended to replace advice given to you by your health care provider. Make sure you discuss any questions you have with your health care provider. Document Released: 03/04/2000 Document Revised: 04/19/2017 Document Reviewed: 04/08/2016 Elsevier Interactive Patient Education  2019 [employer]  Onychomycosis/Fungal Toenails  WHAT IS IT? An infection that lies within the keratin of your nail plate that is caused by a fungus.  WHY ME? Fungal infections affect all ages, sexes, races, and creeds.  There may be many factors that predispose you to a fungal infection such as age, coexisting medical conditions such as diabetes, or an autoimmune disease; stress, medications, fatigue, genetics, etc.  Bottom line: fungus thrives in a warm, moist environment and your shoes offer such a location.  IS IT CONTAGIOUS? Theoretically, yes.  You do not want to share shoes, nail clippers or files with someone who has fungal toenails.  Walking around barefoot in the same room or sleeping in the same bed is unlikely to transfer the organism.  It is important to realize, however, that fungus can spread easily from one nail to the next on the same foot.  HOW DO WE TREAT THIS?  There are several ways to treat this condition.  Treatment may depend on many factors such as age, medications, pregnancy, liver and kidney conditions, etc.  It is best to ask your doctor which options are available to you.  1. No treatment.   Unlike many other medical concerns, you can live with this condition.  However for many people this can be a painful condition and  may lead to ingrown toenails or a bacterial infection.  It is recommended that you keep the nails cut short to help reduce the amount of fungal nail. 2. Topical treatment.  These range from herbal remedies to prescription strength nail lacquers.  About 40-50% effective, topicals require twice daily application for approximately 9 to 12 months or until an entirely new nail has grown out.  The most effective topicals are medical grade medications available through physicians offices. 3. Oral antifungal medications.  With an 80-90% cure rate, the most common oral medication requires 3 to 4 months of therapy and stays in your system for a year as the new nail grows out.  Oral antifungal medications do require   blood work to make sure it is a safe drug for you.  A liver function panel will be performed prior to starting the medication and after the first month of treatment.  It is important to have the blood work performed to avoid any harmful side effects.  In general, this medication safe but blood work is required. 4. Laser Therapy.  This treatment is performed by applying a specialized laser to the affected nail plate.  This therapy is noninvasive, fast, and non-painful.  It is not covered by insurance and is therefore, out of pocket.  The results have been very good with a 80-95% cure rate.  [facility] is the only practice in the area to offer this therapy. 5. Permanent Nail Avulsion.  Removing the entire nail so that a new nail will not grow back. 

## 2018-07-24 ENCOUNTER — Telehealth: Payer: Self-pay | Admitting: Internal Medicine

## 2018-07-24 NOTE — Telephone Encounter (Signed)
New message     Virtual video visit scheduled for 07-25-18.  Pt gave consent for virtual visit.  YOUR CARDIOLOGY TEAM HAS ARRANGED FOR AN E-VISIT FOR YOUR APPOINTMENT - PLEASE REVIEW IMPORTANT INFORMATION BELOW SEVERAL DAYS PRIOR TO YOUR APPOINTMENT  Due to the recent COVID-19 pandemic, we are transitioning in-person office visits to tele-medicine visits in an effort to decrease unnecessary exposure to our patients, their families, and staff. These visits are billed to your insurance just like a normal visit is. We also encourage you to sign up for MyChart if you have not already done so. You will need a smartphone if possible. For patients that do not have this, we can still complete the visit using a regular telephone but do prefer a smartphone to enable video when possible. You may have a family member that lives with you that can help. If possible, we also ask that you have a blood pressure cuff and scale at home to measure your blood pressure, heart rate and weight prior to your scheduled appointment. Patients with clinical needs that need an in-person evaluation and testing will still be able to come to the office if absolutely necessary. If you have any questions, feel free to call our office.     YOUR PROVIDER WILL BE USING THE FOLLOWING PLATFORM TO COMPLETE YOUR VISIT:  Doximity   IF USING MYCHART - How to Download the MyChart App to Your SmartPhone   - If Apple, go to [employer] and type in MyChart in the search bar and download the app. If Android, ask patient to go to [employer] and type in [place] in the search bar and download the app. The app is free but as with any other app downloads, your phone may require you to verify saved payment information or Apple/Android password.  - You will need to then log into the app with your MyChart username and password, and select [facility] as your healthcare provider to link the account.  - When it is time for your visit, go to the  MyChart app, find appointments, and click Begin Video Visit. Be sure to Select Allow for your device to access the Microphone and Camera for your visit. You will then be connected, and your provider will be with you shortly.  **If you have any issues connecting or need assistance, please contact MyChart service desk [phone])**  **If using a computer, in order to ensure the best quality for your visit, you will need to use either of the following Internet Browsers: Insurance underwriter or Microsoft Edge**   IF USING DOXIMITY or [email or web address] - The staff will give you instructions on receiving your link to join the meeting the day of your visit.      2-3 DAYS BEFORE YOUR APPOINTMENT  You will receive a telephone call from one of our [facility] team members - your caller ID may say "Unknown caller." If this is a video visit, we will walk you through how to get the video launched on your phone. We will remind you check your blood pressure, heart rate and weight prior to your scheduled appointment. If you have an Apple Watch or Kardia, please upload any pertinent ECG strips the day before or morning of your appointment to [facility]. Our staff will also make sure you have reviewed the consent and agree to move forward with your scheduled tele-health visit.     THE DAY OF YOUR APPOINTMENT  Approximately 15 minutes prior to your scheduled  appointment, you will receive a telephone call from one of [facility] team - your caller ID may say "Unknown caller."  Our staff will confirm medications, vital signs for the day and any symptoms you may be experiencing. Please have this information available prior to the time of visit start. It may also be helpful for you to have a pad of paper and pen handy for any instructions given during your visit. They will also walk you through joining the smartphone meeting if this is a video visit.    CONSENT FOR TELE-HEALTH VISIT - PLEASE REVIEW  I hereby voluntarily  request, consent and authorize [facility] and its employed or contracted physicians, physician assistants, nurse practitioners or other licensed health care professionals (the Practitioner), to provide me with telemedicine health care services (the Services") as deemed necessary by the treating Practitioner. I acknowledge and consent to receive the Services by the Practitioner via telemedicine. I understand that the telemedicine visit will involve communicating with the Practitioner through live audiovisual communication technology and the disclosure of certain medical information by electronic transmission. I acknowledge that I have been given the opportunity to request an in-person assessment or other available alternative prior to the telemedicine visit and am voluntarily participating in the telemedicine visit.  I understand that I have the right to withhold or withdraw my consent to the use of telemedicine in the course of my care at any time, without affecting my right to future care or treatment, and that the Practitioner or I may terminate the telemedicine visit at any time. I understand that I have the right to inspect all information obtained and/or recorded in the course of the telemedicine visit and may receive copies of available information for a reasonable fee.  I understand that some of the potential risks of receiving the Services via telemedicine include:   Delay or interruption in medical evaluation due to technological equipment failure or disruption;  Information transmitted may not be sufficient (e.g. poor resolution of images) to allow for appropriate medical decision making by the Practitioner; and/or   In rare instances, security protocols could fail, causing a breach of personal health information.  Furthermore, I acknowledge that it is my responsibility to provide information about my medical history, conditions and care that is complete and accurate to the best of my  ability. I acknowledge that Practitioner's advice, recommendations, and/or decision may be based on factors not within their control, such as incomplete or inaccurate data provided by me or distortions of diagnostic images or specimens that may result from electronic transmissions. I understand that the practice of medicine is not an exact science and that Practitioner makes no warranties or guarantees regarding treatment outcomes. I acknowledge that I will receive a copy of this consent concurrently upon execution via email to the email address I last provided but may also request a printed copy by calling the office of [facility].    I understand that my insurance will be billed for this visit.   I have read or had this consent read to me.  I understand the contents of this consent, which adequately explains the benefits and risks of the Services being provided via telemedicine.   I have been provided ample opportunity to ask questions regarding this consent and the Services and have had my questions answered to my satisfaction.  I give my informed consent for the services to be provided through the use of telemedicine in my medical care  By participating in this telemedicine  visit I agree to the above.

## 2018-07-25 ENCOUNTER — Telehealth (INDEPENDENT_AMBULATORY_CARE_PROVIDER_SITE_OTHER): Payer: PPO | Admitting: Internal Medicine

## 2018-07-25 ENCOUNTER — Encounter: Payer: Self-pay | Admitting: Internal Medicine

## 2018-07-25 VITALS — BP 122/64 | HR 68 | Ht 69.5 in | Wt 292.6 lb

## 2018-07-25 DIAGNOSIS — Z7189 Other specified counseling: Secondary | ICD-10-CM

## 2018-07-25 DIAGNOSIS — E0842 Diabetes mellitus due to underlying condition with diabetic polyneuropathy: Secondary | ICD-10-CM | POA: Diagnosis not present

## 2018-07-25 DIAGNOSIS — E782 Mixed hyperlipidemia: Secondary | ICD-10-CM

## 2018-07-25 DIAGNOSIS — N182 Chronic kidney disease, stage 2 (mild): Secondary | ICD-10-CM | POA: Diagnosis not present

## 2018-07-25 DIAGNOSIS — R002 Palpitations: Secondary | ICD-10-CM

## 2018-07-25 DIAGNOSIS — I10 Essential (primary) hypertension: Secondary | ICD-10-CM

## 2018-07-25 DIAGNOSIS — R000 Tachycardia, unspecified: Secondary | ICD-10-CM

## 2018-07-25 NOTE — Patient Instructions (Signed)
Medication Instructions:  INCREASE furosemide (lasix) to 40mg  daily If you need a refill on your cardiac medications before your next appointment, please call your pharmacy.   Lab work: NONE needed If you have labs (blood work) drawn today and your tests are completely normal, you will receive your results only by: Marland Kitchen MyChart Message (if you have MyChart) OR . A paper copy in the mail If you have any lab test that is abnormal or we need to change your treatment, we will call you to review the results.  Testing/Procedures: NONE  Follow-Up: At [facility], you and your health needs are our priority.  As part of our continuing mission to provide you with exceptional heart care, we have created designated Provider Care Teams.  These Care Teams include your primary Cardiologist (physician) and Advanced Practice Providers (APPs -  Physician Assistants and Nurse Practitioners) who all work together to provide you with the care you need, when you need it. You will need a follow up appointment in 6 months.  Please call our office 2 months in advance to schedule this appointment.  You may see Dr. Debara Pickett or one of the following Advanced Practice Providers on your designated Care Team: Almyra Deforest, [place] . Fabian Sharp, PA-C

## 2018-07-25 NOTE — Progress Notes (Signed)
Virtual Visit via Video Note   This visit type was conducted due to national recommendations for restrictions regarding the COVID-19 Pandemic (e.g. social distancing) in an effort to limit this patient's exposure and mitigate transmission in our community.  Due to her co-morbid illnesses, this patient is at least at moderate risk for complications without adequate follow up.  This format is felt to be most appropriate for this patient at this time.  All issues noted in this document were discussed and addressed.  A limited physical exam was performed with this format.  Please refer to the patient's chart for her consent to telehealth for [facility].   Evaluation Performed:  Doximity video visit  Date:  07/25/2018   ID:  [name], DOB 03-16-1951, MRN [id number]  Patient Location:  [address] [place] [address]  Provider location:   [address] [place], [place] [address]  PCP:  [name], MD  Cardiologist:  No primary care provider on file. Electrophysiologist:  None   Chief Complaint:  Weight gain, leg swelling  History of Present Illness:    [name] is a 68 y.o. female who presents via audio/video conferencing for a telehealth visit today.  [name] was seen today in follow-up.  She reports she is had about 30 pound weight gain since I saw her last year, mostly after changing her diet but she has had concomitant edema.  She reports swelling up to her knees in both legs.  She denies any recurrent palpitations.  She is on low-dose Lasix 20 mg daily.  She has a history of a Takatsubo cardiomyopathy with improvement in LVEF back to normal.  She apparently had had a heart cath which showed no significant obstructive coronary disease.  She also has type 2 diabetes, peripheral neuropathy which likely is contributing to her swelling, hypertension (which is well controlled) and dyslipidemia.  The patient does not have symptoms concerning for  COVID-19 infection (fever, chills, cough, or new SHORTNESS OF BREATH).    Prior CV studies:   The following studies were reviewed today:  Lab work Reviewed  PMHx:  Past Medical History:  Diagnosis Date   Chronic kidney disease    Depression    Diabetes mellitus without complication (HCC)    Dyslipidemia    Endometrial adenocarcinoma ([facility] [place])    Esophageal reflux    Fever blister    Hyperlipidemia    Hypertension    OSA (obstructive sleep apnea)    Peripheral neuropathy    Retinopathy    Severe obesity (HCC)     Past Surgical History:  Procedure Laterality Date   ADRENALECTOMY     BUNIONECTOMY     CARDIAC CATHETERIZATION     CERVICAL SPINE SURGERY     CHOLECYSTECTOMY     LAPAROSCOPIC SALPINGOOPHERECTOMY     THORACOTOMY     TONSILLECTOMY      FAMHx:  Family History  Problem Relation Age of Onset   Hypertension Mother    Dementia Mother    Heart attack Father    Cancer Father    CAD Father    Dementia Father    Diverticulitis Sister    Obesity Sister    Hypertension Sister    Voice disorder Brother     SOCHx:   reports that she has never smoked. She has never used smokeless tobacco. She reports that she does not drink alcohol or use drugs.  ALLERGIES:  Allergies  Allergen Reactions   Ciprofloxacin Other (  See Comments)    Unknown   Penicillins Other (See Comments)    Unknown   Sulfa Antibiotics    Sulfamethoxazole Other (See Comments)    Unknown    MEDS:  Current Meds  Medication Sig   buPROPion (WELLBUTRIN XL) 300 MG 24 hr tablet Take 300 mg by mouth daily.   carvedilol (COREG) 3.125 MG tablet Take 1 tablet twice a day for blood pressure   FLUoxetine (PROZAC) 40 MG capsule Take 40 mg by mouth daily.    furosemide (LASIX) 20 MG tablet    insulin NPH-regular Human (NOVOLIN 70/30) (70-30) 100 UNIT/ML injection Inject into the skin. 24 ml in the morning and 20 ml in the evening   losartan (COZAAR) 100 MG  tablet Take 100 mg by mouth daily.    metFORMIN (GLUCOPHAGE-XR) 500 MG 24 hr tablet Take by mouth. PT TAKES 2000 MG DAILY   mupirocin ointment (BACTROBAN) 2 % Apply 1 application topically 2 (two) times daily.   nystatin cream (MYCOSTATIN) 1 APPLICATION TWICE A DAY EXTERNALLY   ONETOUCH VERIO test strip    pravastatin (PRAVACHOL) 40 MG tablet Take 1 tablet once a day for cholesterol     ROS: Pertinent items noted in HPI and remainder of comprehensive ROS otherwise negative.  Labs/Other Tests and Data Reviewed:    Recent Labs: No results found for requested labs within last 8760 hours.   Recent Lipid Panel Lab Results  Component Value Date/Time   CHOL  07/31/2007 09:30 AM    117        ATP III CLASSIFICATION:  <200     mg/dL   Desirable  200-239  mg/dL   Borderline High  >=240    mg/dL   High   TRIG 211 (H) 07/31/2007 09:30 AM   HDL 29 (L) 07/31/2007 09:30 AM   CHOLHDL 4.0 07/31/2007 09:30 AM   LDLCALC  07/31/2007 09:30 AM    46        Total Cholesterol/HDL:CHD Risk Coronary Heart Disease Risk Table                     Men   Women  1/2 Average Risk   3.4   3.3    Wt Readings from Last 3 Encounters:  07/25/18 292 lb 9.6 oz (132.7 kg)  07/19/17 268 lb 3.2 oz (121.7 kg)  08/06/14 255 lb (115.7 kg)     Exam:    Vital Signs:  BP 122/64    Pulse 68    Ht 5' 9.5" (1.765 m)    Wt 292 lb 9.6 oz (132.7 kg)    BMI 42.59 kg/m    General appearance: alert and pale Lungs: no audible wheezing Abdomen: morbidly obese Extremities: edema 2+ Skin: Skin color, texture, turgor normal. No rashes or lesions Neurologic: Mental status: Alert, oriented, thought content appropriate psych: Pleasant  ASSESSMENT & PLAN:    1. Lower extremity edema 2. Peripheral neuropathy 3. Type 2 diabetes, uncontrolled  4. Morbid obesity 5. History of Takatsubo cardiomyopathy 6. Hypertension  [name] has had significant weight gain of about 30 pounds over the last year.  At one point her  hemoglobin A1c was down to 6.2 however more recently was up to 7.1 and may be even higher.  She has had significant dietary changes and knows that she needs to increase her activity level and improve her diet.  She has significant peripheral neuropathy and lower extremity edema.  She is now describing 2+ lower  extremity edema bilaterally and is only on low-dose Lasix 20 mg daily.  I recommended increasing that to 40 mg daily and advised her to purchase bilateral knee-high compression stockings through elastic therapy in [place].  COVID-19 Education: The signs and symptoms of COVID-19 were discussed with the patient and how to seek care for testing (follow up with PCP or arrange E-visit).  The importance of social distancing was discussed today.  Patient Risk:   After full review of this patients clinical status, I feel that they are at least moderate risk at this time.  Time:   Today, I have spent 25 minutes with the patient with telehealth technology discussing edema, diabetes, morbid obesity, history of Takatsubo cardiomyopathy, hypertension, peripheral neuropathy.     Medication Adjustments/Labs and Tests Ordered: Current medicines are reviewed at length with the patient today.  Concerns regarding medicines are outlined above.   Tests Ordered: No orders of the defined types were placed in this encounter.   Medication Changes: No orders of the defined types were placed in this encounter.   Disposition:  in 6 month(s)  Pixie Casino, MD, [facility] [place] Director of the Advanced Lipid Disorders &  Cardiovascular Risk Reduction Clinic Diplomate of the American Board of Clinical Lipidology Attending Cardiologist  Direct Dial: [phone]   Fax: [phone]  Website:  [email or web address]  Pixie Casino, MD  07/25/2018 3:09 PM

## 2018-07-26 ENCOUNTER — Encounter: Payer: Self-pay | Admitting: Podiatry

## 2018-07-26 MED ORDER — FUROSEMIDE 40 MG PO TABS: 40.0000 mg | ORAL_TABLET | Freq: Every day | ORAL | 3 refills | Status: DC

## 2018-07-26 NOTE — Progress Notes (Signed)
Subjective: [name] presents today with history amputation left 3rd digit and  diabetic neuropathy for preventative foot care.    [name], MD is her PCP.   Current Outpatient Medications:  .  carvedilol (COREG) 3.125 MG tablet, Take 1 tablet twice a day for blood pressure, Disp: , Rfl:  .  FLUoxetine (PROZAC) 40 MG capsule, Take 40 mg by mouth daily. , Disp: , Rfl: 0 .  insulin NPH-regular Human (NOVOLIN 70/30) (70-30) 100 UNIT/ML injection, Inject into the skin. 24 ml in the morning and 20 ml in the evening, Disp: , Rfl:  .  losartan (COZAAR) 100 MG tablet, Take 100 mg by mouth daily. , Disp: , Rfl:  .  metFORMIN (GLUCOPHAGE-XR) 500 MG 24 hr tablet, Take by mouth. PT TAKES 2000 MG DAILY, Disp: , Rfl:  .  mupirocin ointment (BACTROBAN) 2 %, Apply 1 application topically 2 (two) times daily., Disp: 30 g, Rfl: 2 .  nystatin cream (MYCOSTATIN), 1 APPLICATION TWICE A DAY EXTERNALLY, Disp: , Rfl:  .  ONETOUCH VERIO test strip, , Disp: , Rfl:  .  pravastatin (PRAVACHOL) 40 MG tablet, Take 1 tablet once a day for cholesterol, Disp: , Rfl:  .  buPROPion (WELLBUTRIN XL) 300 MG 24 hr tablet, Take 300 mg by mouth daily., Disp: , Rfl:  .  furosemide (LASIX) 40 MG tablet, Take 1 tablet (40 mg total) by mouth daily., Disp: 90 tablet, Rfl: 3  Allergies  Allergen Reactions  . Ciprofloxacin Other (See Comments)    Unknown  . Penicillins Other (See Comments)    Unknown  . Sulfa Antibiotics   . Sulfamethoxazole Other (See Comments)    Unknown    Objective: Vitals:   07/19/18 1417  Temp: (!) 95.2 F (35.1 C)   Vascular Examination: Capillary refill time <3 seconds x 9 digits.  Dorsalis pedis and Posterior tibial pulses present b/l.  Digital hair x 9 digits was absent.  Skin temperature gradient WNL b/l.  Dermatological Examination: Skin with normal turgor, texture and tone b/l.  Toenails 1-5 right, 1, 2, 4, 5 left discolored, thick, dystrophic with subungual debris and pain  with palpation to nailbeds due to thickness of nails.  Musculoskeletal: Muscle strength 5/5 to all muscle groups b/l.  Amputation left 3rd digit.  Neurological: Sensation with 10 gram monofilament is absent b/l.  Vibratory sensation absent b/l.  Assessment: 1. Painful onychomycosis toenails 1-5 right, 1, 2, 4, 5 left 2. Amputation status left 3rd digit 3. NIDDM with neuropathy  Plan: 1. Toenails 1-5 right, 1, 2, 4, 5 left were debrided in length and girth without iatrogenic bleeding. 2. Patient to continue soft, supportive shoe gear. 3. Avoid self trimming due to diabetes/neuropathy. 4. Patient to report any pedal injuries to medical professional  5. Follow up 9 weeks.  6. Patient/POA to call should there be a concern in the interim.

## 2018-07-26 NOTE — Addendum Note (Signed)
Addended by: Fidel Levy on: 07/26/2018 07:46 AM   Modules accepted: Orders

## 2018-08-30 DIAGNOSIS — Z89411 Acquired absence of right great toe: Secondary | ICD-10-CM | POA: Diagnosis not present

## 2018-08-30 DIAGNOSIS — Z5181 Encounter for therapeutic drug level monitoring: Secondary | ICD-10-CM | POA: Diagnosis not present

## 2018-08-30 DIAGNOSIS — Z794 Long term (current) use of insulin: Secondary | ICD-10-CM | POA: Diagnosis not present

## 2018-08-30 DIAGNOSIS — E1142 Type 2 diabetes mellitus with diabetic polyneuropathy: Secondary | ICD-10-CM | POA: Diagnosis not present

## 2018-09-17 NOTE — Telephone Encounter (Signed)
Opened in error

## 2018-09-25 ENCOUNTER — Ambulatory Visit: Payer: PPO | Admitting: Podiatry

## 2018-10-03 DIAGNOSIS — E1142 Type 2 diabetes mellitus with diabetic polyneuropathy: Secondary | ICD-10-CM | POA: Diagnosis not present

## 2018-10-03 DIAGNOSIS — E781 Pure hyperglyceridemia: Secondary | ICD-10-CM | POA: Diagnosis not present

## 2018-10-03 DIAGNOSIS — E1165 Type 2 diabetes mellitus with hyperglycemia: Secondary | ICD-10-CM | POA: Diagnosis not present

## 2018-10-03 DIAGNOSIS — Z79899 Other long term (current) drug therapy: Secondary | ICD-10-CM | POA: Diagnosis not present

## 2018-10-04 DIAGNOSIS — I2510 Atherosclerotic heart disease of native coronary artery without angina pectoris: Secondary | ICD-10-CM | POA: Diagnosis not present

## 2018-10-04 DIAGNOSIS — E1140 Type 2 diabetes mellitus with diabetic neuropathy, unspecified: Secondary | ICD-10-CM | POA: Diagnosis not present

## 2018-10-04 DIAGNOSIS — E11319 Type 2 diabetes mellitus with unspecified diabetic retinopathy without macular edema: Secondary | ICD-10-CM | POA: Diagnosis not present

## 2018-10-04 DIAGNOSIS — F325 Major depressive disorder, single episode, in full remission: Secondary | ICD-10-CM | POA: Diagnosis not present

## 2018-10-04 DIAGNOSIS — E1121 Type 2 diabetes mellitus with diabetic nephropathy: Secondary | ICD-10-CM | POA: Diagnosis not present

## 2018-10-04 DIAGNOSIS — F331 Major depressive disorder, recurrent, moderate: Secondary | ICD-10-CM | POA: Diagnosis not present

## 2018-10-04 DIAGNOSIS — E1165 Type 2 diabetes mellitus with hyperglycemia: Secondary | ICD-10-CM | POA: Diagnosis not present

## 2018-10-04 DIAGNOSIS — E113293 Type 2 diabetes mellitus with mild nonproliferative diabetic retinopathy without macular edema, bilateral: Secondary | ICD-10-CM | POA: Diagnosis not present

## 2018-10-04 DIAGNOSIS — E1142 Type 2 diabetes mellitus with diabetic polyneuropathy: Secondary | ICD-10-CM | POA: Diagnosis not present

## 2018-10-04 DIAGNOSIS — E785 Hyperlipidemia, unspecified: Secondary | ICD-10-CM | POA: Diagnosis not present

## 2018-10-04 DIAGNOSIS — I129 Hypertensive chronic kidney disease with stage 1 through stage 4 chronic kidney disease, or unspecified chronic kidney disease: Secondary | ICD-10-CM | POA: Diagnosis not present

## 2018-10-22 ENCOUNTER — Ambulatory Visit: Payer: PPO | Admitting: Podiatry

## 2018-10-22 ENCOUNTER — Other Ambulatory Visit: Payer: Self-pay

## 2018-10-22 ENCOUNTER — Encounter: Payer: Self-pay | Admitting: Podiatry

## 2018-10-22 VITALS — BP 134/69 | Temp 97.6°F

## 2018-10-22 DIAGNOSIS — L84 Corns and callosities: Secondary | ICD-10-CM

## 2018-10-22 DIAGNOSIS — Z89411 Acquired absence of right great toe: Secondary | ICD-10-CM

## 2018-10-22 DIAGNOSIS — B351 Tinea unguium: Secondary | ICD-10-CM

## 2018-10-22 DIAGNOSIS — E1142 Type 2 diabetes mellitus with diabetic polyneuropathy: Secondary | ICD-10-CM | POA: Diagnosis not present

## 2018-10-22 DIAGNOSIS — Z89422 Acquired absence of other left toe(s): Secondary | ICD-10-CM

## 2018-10-22 DIAGNOSIS — S90412A Abrasion, left great toe, initial encounter: Secondary | ICD-10-CM

## 2018-10-22 NOTE — Patient Instructions (Signed)
Diabetes Mellitus and Foot Care Foot care is an important part of your health, especially when you have diabetes. Diabetes may cause you to have problems because of poor blood flow (circulation) to your feet and legs, which can cause your skin to:  Become thinner and drier.  Break more easily.  Heal more slowly.  Peel and crack. You may also have nerve damage (neuropathy) in your legs and feet, causing decreased feeling in them. This means that you may not notice minor injuries to your feet that could lead to more serious problems. Noticing and addressing any potential problems early is the best way to prevent future foot problems. How to care for your feet Foot hygiene  Wash your feet daily with warm water and mild soap. Do not use hot water. Then, pat your feet and the areas between your toes until they are completely dry. Do not soak your feet as this can dry your skin.  Trim your toenails straight across. Do not dig under them or around the cuticle. File the edges of your nails with an emery board or nail file.  Apply a moisturizing lotion or petroleum jelly to the skin on your feet and to dry, brittle toenails. Use lotion that does not contain alcohol and is unscented. Do not apply lotion between your toes. Shoes and socks  Wear clean socks or stockings every day. Make sure they are not too tight. Do not wear knee-high stockings since they may decrease blood flow to your legs.  Wear shoes that fit properly and have enough cushioning. Always look in your shoes before you put them on to be sure there are no objects inside.  To break in new shoes, wear them for just a few hours a day. This prevents injuries on your feet. Wounds, scrapes, corns, and calluses  Check your feet daily for blisters, cuts, bruises, sores, and redness. If you cannot see the bottom of your feet, use a mirror or ask someone for help.  Do not cut corns or calluses or try to remove them with medicine.  If you  find a minor scrape, cut, or break in the skin on your feet, keep it and the skin around it clean and dry. You may clean these areas with mild soap and water. Do not clean the area with peroxide, alcohol, or iodine.  If you have a wound, scrape, corn, or callus on your foot, look at it several times a day to make sure it is healing and not infected. Check for: ? Redness, swelling, or pain. ? Fluid or blood. ? Warmth. ? Pus or a bad smell. General instructions  Do not cross your legs. This may decrease blood flow to your feet.  Do not use heating pads or hot water bottles on your feet. They may burn your skin. If you have lost feeling in your feet or legs, you may not know this is happening until it is too late.  Protect your feet from hot and cold by wearing shoes, such as at the beach or on hot pavement.  Schedule a complete foot exam at least once a year (annually) or more often if you have foot problems. If you have foot problems, report any cuts, sores, or bruises to your health care provider immediately. Contact a health care provider if:  You have a medical condition that increases your risk of infection and you have any cuts, sores, or bruises on your feet.  You have an injury that is not   healing.  You have redness on your legs or feet.  You feel burning or tingling in your legs or feet.  You have pain or cramps in your legs and feet.  Your legs or feet are numb.  Your feet always feel cold.  You have pain around a toenail. Get help right away if:  You have a wound, scrape, corn, or callus on your foot and: ? You have pain, swelling, or redness that gets worse. ? You have fluid or blood coming from the wound, scrape, corn, or callus. ? Your wound, scrape, corn, or callus feels warm to the touch. ? You have pus or a bad smell coming from the wound, scrape, corn, or callus. ? You have a fever. ? You have a red line going up your leg. Summary  Check your feet every day  for cuts, sores, red spots, swelling, and blisters.  Moisturize feet and legs daily.  Wear shoes that fit properly and have enough cushioning.  If you have foot problems, report any cuts, sores, or bruises to your health care provider immediately.  Schedule a complete foot exam at least once a year (annually) or more often if you have foot problems. This information is not intended to replace advice given to you by your health care provider. Make sure you discuss any questions you have with your health care provider. Document Released: 03/04/2000 Document Revised: 04/19/2017 Document Reviewed: 04/08/2016 Elsevier Patient Education  2020 [employer]  

## 2018-10-23 ENCOUNTER — Encounter: Payer: Self-pay | Admitting: Podiatry

## 2018-10-23 NOTE — Progress Notes (Signed)
Subjective: [name] presents for at-risk foot care on today's visit. She has h/o amputation of right hallux and left 3rd digit.   She states she noticed an open area on her left great toe this morning. She suspects it may have been a blister that burst. She had been wearing another pair of Crocs and thinks this may have rubbed the toe. She also has an abrasion on her left 2nd digit which has been present for about one week. She states she has been keeping it clean and dry.   [name], MD is her PCP.    Current Outpatient Medications:  .  buPROPion (WELLBUTRIN XL) 300 MG 24 hr tablet, Take 300 mg by mouth daily., Disp: , Rfl:  .  carvedilol (COREG) 3.125 MG tablet, Take 1 tablet twice a day for blood pressure, Disp: , Rfl:  .  FLUoxetine (PROZAC) 40 MG capsule, Take 40 mg by mouth daily. , Disp: , Rfl: 0 .  furosemide (LASIX) 40 MG tablet, Take 1 tablet (40 mg total) by mouth daily., Disp: 90 tablet, Rfl: 3 .  insulin NPH-regular Human (NOVOLIN 70/30) (70-30) 100 UNIT/ML injection, Inject into the skin. 24 ml in the morning and 20 ml in the evening, Disp: , Rfl:  .  losartan (COZAAR) 100 MG tablet, Take 100 mg by mouth daily. , Disp: , Rfl:  .  metFORMIN (GLUCOPHAGE-XR) 500 MG 24 hr tablet, Take by mouth. PT TAKES 2000 MG DAILY, Disp: , Rfl:  .  mupirocin ointment (BACTROBAN) 2 %, Apply 1 application topically 2 (two) times daily., Disp: 30 g, Rfl: 2 .  nystatin cream (MYCOSTATIN), 1 APPLICATION TWICE A DAY EXTERNALLY, Disp: , Rfl:  .  ONETOUCH VERIO test strip, , Disp: , Rfl:  .  pravastatin (PRAVACHOL) 40 MG tablet, Take 1 tablet once a day for cholesterol, Disp: , Rfl:   Allergies  Allergen Reactions  . Ciprofloxacin Other (See Comments)    Unknown  . Penicillins Other (See Comments)    Unknown  . Sulfa Antibiotics   . Sulfamethoxazole Other (See Comments)    Unknown    Objective: Vitals:   10/22/18 1117  BP: 134/69  Temp: 97.6 F (36.4 C)    Vascular  Examination: Capillary refill time <3 seconds  x 10 digits.  Dorsalis pedis pulses present b/l.  Posterior tibial pulses present b/l.  Digital hair absent x 8 digits.  Skin temperature gradient WNL b/l.  Dermatological Examination: Skin with normal turgor, texture and tone b/l.  Toenails 2-5 right, 1, 2, 4, 5 left discolored, thick, dystrophic with subungual debris and pain with palpation to nailbeds due to thickness of nails.  Hyperkeratotic lesions 1st met head b/l. No erythema, no edema, no drainage, no flocculence noted.   Fresh abrasion noted dorsomedial left hallux with red, granular base. Healing abrasion left 2nd digit. No erythema, no edema, no drainage, no flocculence.  Musculoskeletal: Muscle strength 5/5 to all LE muscle groups.  Amputation right hallux and left 3rd digit.  No pain, crepitus or joint limitation with passive/active ROM.  Neurological: Sensation diminished b/l with 10 gram monofilament.  Vibratory sensation diminished b/l. Assessment: 1. Onychomycosis toenails  2-5 right, 1, 2, 4, 5 left  2. Calluses 1st met heads b/l 3. NIDDM with Diabetic neuropathy 4. S/p amputations: right hallux, left 3rd digit  Plan: 1. Continue diabetic foot care principles. Literature dispensed on today. 2. Toenails  2-5 right, 1, 2, 4, 5 left were debrided in length and girth without  iatrogenic bleeding. 3. Calluses pared 1st met heads b/l utilizing sterile scalpel blade without incident.  4. For abrasions, left hallux and left 2nd digit cleansed with alcohol and band-aids applied. She was instructed to apply antibiotic ointment to areas once daily with band-aids. Advised her to avoid ill fitting shoe gear and she will comply. Call if she experiences redness, drainage, nonhealing or swelling of left hallux or left 2nd digit. 5. Patient to report any pedal injuries to medical professional immediately. 6. Follow up 9 weeks. 7. Patient/POA to call should there be a concern  in the interim.

## 2018-11-02 DIAGNOSIS — Z1231 Encounter for screening mammogram for malignant neoplasm of breast: Secondary | ICD-10-CM | POA: Diagnosis not present

## 2018-11-08 DIAGNOSIS — E1165 Type 2 diabetes mellitus with hyperglycemia: Secondary | ICD-10-CM | POA: Diagnosis not present

## 2018-11-08 DIAGNOSIS — E785 Hyperlipidemia, unspecified: Secondary | ICD-10-CM | POA: Diagnosis not present

## 2018-11-08 DIAGNOSIS — I2510 Atherosclerotic heart disease of native coronary artery without angina pectoris: Secondary | ICD-10-CM | POA: Diagnosis not present

## 2018-11-08 DIAGNOSIS — F331 Major depressive disorder, recurrent, moderate: Secondary | ICD-10-CM | POA: Diagnosis not present

## 2018-11-08 DIAGNOSIS — F325 Major depressive disorder, single episode, in full remission: Secondary | ICD-10-CM | POA: Diagnosis not present

## 2018-11-08 DIAGNOSIS — E113291 Type 2 diabetes mellitus with mild nonproliferative diabetic retinopathy without macular edema, right eye: Secondary | ICD-10-CM | POA: Diagnosis not present

## 2018-11-08 DIAGNOSIS — E11319 Type 2 diabetes mellitus with unspecified diabetic retinopathy without macular edema: Secondary | ICD-10-CM | POA: Diagnosis not present

## 2018-11-08 DIAGNOSIS — I129 Hypertensive chronic kidney disease with stage 1 through stage 4 chronic kidney disease, or unspecified chronic kidney disease: Secondary | ICD-10-CM | POA: Diagnosis not present

## 2018-11-08 DIAGNOSIS — E1121 Type 2 diabetes mellitus with diabetic nephropathy: Secondary | ICD-10-CM | POA: Diagnosis not present

## 2018-11-08 DIAGNOSIS — E1142 Type 2 diabetes mellitus with diabetic polyneuropathy: Secondary | ICD-10-CM | POA: Diagnosis not present

## 2018-11-08 DIAGNOSIS — N183 Chronic kidney disease, stage 3 (moderate): Secondary | ICD-10-CM | POA: Diagnosis not present

## 2018-11-30 ENCOUNTER — Ambulatory Visit (INDEPENDENT_AMBULATORY_CARE_PROVIDER_SITE_OTHER): Payer: PPO

## 2018-11-30 ENCOUNTER — Ambulatory Visit (INDEPENDENT_AMBULATORY_CARE_PROVIDER_SITE_OTHER): Payer: PPO | Admitting: Podiatry

## 2018-11-30 ENCOUNTER — Other Ambulatory Visit: Payer: Self-pay

## 2018-11-30 ENCOUNTER — Encounter: Payer: Self-pay | Admitting: Podiatry

## 2018-11-30 DIAGNOSIS — S9032XA Contusion of left foot, initial encounter: Secondary | ICD-10-CM

## 2018-11-30 DIAGNOSIS — Z794 Long term (current) use of insulin: Secondary | ICD-10-CM

## 2018-11-30 DIAGNOSIS — T148XXA Other injury of unspecified body region, initial encounter: Secondary | ICD-10-CM | POA: Diagnosis not present

## 2018-11-30 DIAGNOSIS — E1165 Type 2 diabetes mellitus with hyperglycemia: Secondary | ICD-10-CM

## 2018-12-05 DIAGNOSIS — N183 Chronic kidney disease, stage 3 (moderate): Secondary | ICD-10-CM | POA: Diagnosis not present

## 2018-12-05 DIAGNOSIS — F3341 Major depressive disorder, recurrent, in partial remission: Secondary | ICD-10-CM | POA: Diagnosis not present

## 2018-12-05 DIAGNOSIS — E11319 Type 2 diabetes mellitus with unspecified diabetic retinopathy without macular edema: Secondary | ICD-10-CM | POA: Diagnosis not present

## 2018-12-05 DIAGNOSIS — E1165 Type 2 diabetes mellitus with hyperglycemia: Secondary | ICD-10-CM | POA: Diagnosis not present

## 2018-12-05 DIAGNOSIS — E1140 Type 2 diabetes mellitus with diabetic neuropathy, unspecified: Secondary | ICD-10-CM | POA: Diagnosis not present

## 2018-12-05 DIAGNOSIS — I129 Hypertensive chronic kidney disease with stage 1 through stage 4 chronic kidney disease, or unspecified chronic kidney disease: Secondary | ICD-10-CM | POA: Diagnosis not present

## 2018-12-05 DIAGNOSIS — E1121 Type 2 diabetes mellitus with diabetic nephropathy: Secondary | ICD-10-CM | POA: Diagnosis not present

## 2018-12-05 DIAGNOSIS — E113293 Type 2 diabetes mellitus with mild nonproliferative diabetic retinopathy without macular edema, bilateral: Secondary | ICD-10-CM | POA: Diagnosis not present

## 2018-12-05 DIAGNOSIS — I2510 Atherosclerotic heart disease of native coronary artery without angina pectoris: Secondary | ICD-10-CM | POA: Diagnosis not present

## 2018-12-05 DIAGNOSIS — E785 Hyperlipidemia, unspecified: Secondary | ICD-10-CM | POA: Diagnosis not present

## 2018-12-05 DIAGNOSIS — E1142 Type 2 diabetes mellitus with diabetic polyneuropathy: Secondary | ICD-10-CM | POA: Diagnosis not present

## 2018-12-05 DIAGNOSIS — F339 Major depressive disorder, recurrent, unspecified: Secondary | ICD-10-CM | POA: Diagnosis not present

## 2018-12-09 NOTE — Progress Notes (Signed)
Subjective: 68 year old female presents the office today for concerns of bruising to the top of her left foot.  She does not recall specific injury but she states that she has been cleaning out her mother's house and on her foot a lot more recently she noticed a bruise about 2 weeks ago.  No significant increase in pain.  No swelling.  No open sores.Denies any systemic complaints such as fevers, chills, nausea, vomiting. No acute changes since last appointment, and no other complaints at this time.   Objective: AAO x3, NAD DP/PT pulses palpable bilaterally, CRT less than 3 seconds Bruising present dorsal aspect of the MPJ area on the third MPJ in the third digit.  No significant tenderness there is no edema.  No open sores. No pain with calf compression, swelling, warmth, erythema  Assessment: Contusion left foot  Plan: -All treatment options discussed with the patient including all alternatives, risks, complications.  -X-rays obtained reviewed.  No evidence of acute fracture.  Recommend wearing stiffer soled shoe.  Elevation.  Follow-up as scheduled or sooner if needed. -Patient encouraged to call the office with any questions, concerns, change in symptoms.   Trula Slade DPM

## 2018-12-24 ENCOUNTER — Ambulatory Visit: Payer: PPO | Admitting: Podiatry

## 2019-01-17 DIAGNOSIS — I129 Hypertensive chronic kidney disease with stage 1 through stage 4 chronic kidney disease, or unspecified chronic kidney disease: Secondary | ICD-10-CM | POA: Diagnosis not present

## 2019-01-17 DIAGNOSIS — N183 Chronic kidney disease, stage 3 unspecified: Secondary | ICD-10-CM | POA: Diagnosis not present

## 2019-01-17 DIAGNOSIS — E1165 Type 2 diabetes mellitus with hyperglycemia: Secondary | ICD-10-CM | POA: Diagnosis not present

## 2019-01-17 DIAGNOSIS — E1140 Type 2 diabetes mellitus with diabetic neuropathy, unspecified: Secondary | ICD-10-CM | POA: Diagnosis not present

## 2019-01-17 DIAGNOSIS — I2510 Atherosclerotic heart disease of native coronary artery without angina pectoris: Secondary | ICD-10-CM | POA: Diagnosis not present

## 2019-01-17 DIAGNOSIS — F325 Major depressive disorder, single episode, in full remission: Secondary | ICD-10-CM | POA: Diagnosis not present

## 2019-01-17 DIAGNOSIS — E1121 Type 2 diabetes mellitus with diabetic nephropathy: Secondary | ICD-10-CM | POA: Diagnosis not present

## 2019-01-17 DIAGNOSIS — E785 Hyperlipidemia, unspecified: Secondary | ICD-10-CM | POA: Diagnosis not present

## 2019-01-17 DIAGNOSIS — E113299 Type 2 diabetes mellitus with mild nonproliferative diabetic retinopathy without macular edema, unspecified eye: Secondary | ICD-10-CM | POA: Diagnosis not present

## 2019-01-17 DIAGNOSIS — E11319 Type 2 diabetes mellitus with unspecified diabetic retinopathy without macular edema: Secondary | ICD-10-CM | POA: Diagnosis not present

## 2019-01-17 DIAGNOSIS — F3342 Major depressive disorder, recurrent, in full remission: Secondary | ICD-10-CM | POA: Diagnosis not present

## 2019-01-17 DIAGNOSIS — E1142 Type 2 diabetes mellitus with diabetic polyneuropathy: Secondary | ICD-10-CM | POA: Diagnosis not present

## 2019-01-29 DIAGNOSIS — E1165 Type 2 diabetes mellitus with hyperglycemia: Secondary | ICD-10-CM | POA: Diagnosis not present

## 2019-01-29 DIAGNOSIS — E1140 Type 2 diabetes mellitus with diabetic neuropathy, unspecified: Secondary | ICD-10-CM | POA: Diagnosis not present

## 2019-01-29 DIAGNOSIS — F325 Major depressive disorder, single episode, in full remission: Secondary | ICD-10-CM | POA: Diagnosis not present

## 2019-01-29 DIAGNOSIS — I129 Hypertensive chronic kidney disease with stage 1 through stage 4 chronic kidney disease, or unspecified chronic kidney disease: Secondary | ICD-10-CM | POA: Diagnosis not present

## 2019-01-29 DIAGNOSIS — I2510 Atherosclerotic heart disease of native coronary artery without angina pectoris: Secondary | ICD-10-CM | POA: Diagnosis not present

## 2019-01-29 DIAGNOSIS — E785 Hyperlipidemia, unspecified: Secondary | ICD-10-CM | POA: Diagnosis not present

## 2019-01-29 DIAGNOSIS — F3342 Major depressive disorder, recurrent, in full remission: Secondary | ICD-10-CM | POA: Diagnosis not present

## 2019-01-29 DIAGNOSIS — E1121 Type 2 diabetes mellitus with diabetic nephropathy: Secondary | ICD-10-CM | POA: Diagnosis not present

## 2019-01-29 DIAGNOSIS — F331 Major depressive disorder, recurrent, moderate: Secondary | ICD-10-CM | POA: Diagnosis not present

## 2019-01-29 DIAGNOSIS — E113299 Type 2 diabetes mellitus with mild nonproliferative diabetic retinopathy without macular edema, unspecified eye: Secondary | ICD-10-CM | POA: Diagnosis not present

## 2019-01-29 DIAGNOSIS — E11319 Type 2 diabetes mellitus with unspecified diabetic retinopathy without macular edema: Secondary | ICD-10-CM | POA: Diagnosis not present

## 2019-01-29 DIAGNOSIS — E1142 Type 2 diabetes mellitus with diabetic polyneuropathy: Secondary | ICD-10-CM | POA: Diagnosis not present

## 2019-02-10 DIAGNOSIS — R519 Headache, unspecified: Secondary | ICD-10-CM | POA: Diagnosis not present

## 2019-02-10 DIAGNOSIS — I10 Essential (primary) hypertension: Secondary | ICD-10-CM | POA: Diagnosis not present

## 2019-02-10 DIAGNOSIS — G8911 Acute pain due to trauma: Secondary | ICD-10-CM | POA: Diagnosis not present

## 2019-02-10 DIAGNOSIS — S0990XA Unspecified injury of head, initial encounter: Secondary | ICD-10-CM | POA: Diagnosis not present

## 2019-02-10 DIAGNOSIS — T148XXA Other injury of unspecified body region, initial encounter: Secondary | ICD-10-CM | POA: Diagnosis not present

## 2019-02-10 DIAGNOSIS — M542 Cervicalgia: Secondary | ICD-10-CM | POA: Diagnosis not present

## 2019-02-10 DIAGNOSIS — E119 Type 2 diabetes mellitus without complications: Secondary | ICD-10-CM | POA: Diagnosis not present

## 2019-02-10 DIAGNOSIS — S0083XA Contusion of other part of head, initial encounter: Secondary | ICD-10-CM | POA: Diagnosis not present

## 2019-02-11 DIAGNOSIS — S0990XA Unspecified injury of head, initial encounter: Secondary | ICD-10-CM | POA: Diagnosis not present

## 2019-02-11 DIAGNOSIS — M542 Cervicalgia: Secondary | ICD-10-CM | POA: Diagnosis not present

## 2019-02-25 DIAGNOSIS — R9089 Other abnormal findings on diagnostic imaging of central nervous system: Secondary | ICD-10-CM | POA: Diagnosis not present

## 2019-02-25 DIAGNOSIS — R413 Other amnesia: Secondary | ICD-10-CM | POA: Diagnosis not present

## 2019-02-25 DIAGNOSIS — M47812 Spondylosis without myelopathy or radiculopathy, cervical region: Secondary | ICD-10-CM | POA: Diagnosis not present

## 2019-02-25 DIAGNOSIS — F4321 Adjustment disorder with depressed mood: Secondary | ICD-10-CM | POA: Diagnosis not present

## 2019-02-25 DIAGNOSIS — R42 Dizziness and giddiness: Secondary | ICD-10-CM | POA: Diagnosis not present

## 2019-02-26 ENCOUNTER — Encounter: Payer: Self-pay | Admitting: Neurology

## 2019-03-18 DIAGNOSIS — E1142 Type 2 diabetes mellitus with diabetic polyneuropathy: Secondary | ICD-10-CM | POA: Diagnosis not present

## 2019-03-18 DIAGNOSIS — I129 Hypertensive chronic kidney disease with stage 1 through stage 4 chronic kidney disease, or unspecified chronic kidney disease: Secondary | ICD-10-CM | POA: Diagnosis not present

## 2019-03-18 DIAGNOSIS — I10 Essential (primary) hypertension: Secondary | ICD-10-CM | POA: Diagnosis not present

## 2019-03-18 DIAGNOSIS — I2510 Atherosclerotic heart disease of native coronary artery without angina pectoris: Secondary | ICD-10-CM | POA: Diagnosis not present

## 2019-03-18 DIAGNOSIS — E785 Hyperlipidemia, unspecified: Secondary | ICD-10-CM | POA: Diagnosis not present

## 2019-03-18 DIAGNOSIS — E11319 Type 2 diabetes mellitus with unspecified diabetic retinopathy without macular edema: Secondary | ICD-10-CM | POA: Diagnosis not present

## 2019-03-18 DIAGNOSIS — E113299 Type 2 diabetes mellitus with mild nonproliferative diabetic retinopathy without macular edema, unspecified eye: Secondary | ICD-10-CM | POA: Diagnosis not present

## 2019-03-18 DIAGNOSIS — E1165 Type 2 diabetes mellitus with hyperglycemia: Secondary | ICD-10-CM | POA: Diagnosis not present

## 2019-03-18 DIAGNOSIS — F3341 Major depressive disorder, recurrent, in partial remission: Secondary | ICD-10-CM | POA: Diagnosis not present

## 2019-03-18 DIAGNOSIS — E1121 Type 2 diabetes mellitus with diabetic nephropathy: Secondary | ICD-10-CM | POA: Diagnosis not present

## 2019-03-18 DIAGNOSIS — E1140 Type 2 diabetes mellitus with diabetic neuropathy, unspecified: Secondary | ICD-10-CM | POA: Diagnosis not present

## 2019-03-18 DIAGNOSIS — F3342 Major depressive disorder, recurrent, in full remission: Secondary | ICD-10-CM | POA: Diagnosis not present

## 2019-03-28 DIAGNOSIS — I10 Essential (primary) hypertension: Secondary | ICD-10-CM | POA: Diagnosis not present

## 2019-04-01 ENCOUNTER — Ambulatory Visit
Admission: RE | Admit: 2019-04-01 | Discharge: 2019-04-01 | Disposition: A | Discharge status: Home / Self Care | Payer: PPO | Source: Ambulatory Visit | Attending: Internal Medicine | Admitting: Internal Medicine

## 2019-04-01 ENCOUNTER — Other Ambulatory Visit: Payer: Self-pay | Admitting: Internal Medicine

## 2019-04-01 DIAGNOSIS — Z5181 Encounter for therapeutic drug level monitoring: Secondary | ICD-10-CM | POA: Diagnosis not present

## 2019-04-01 DIAGNOSIS — M25561 Pain in right knee: Secondary | ICD-10-CM | POA: Diagnosis not present

## 2019-04-01 DIAGNOSIS — E1165 Type 2 diabetes mellitus with hyperglycemia: Secondary | ICD-10-CM | POA: Diagnosis not present

## 2019-04-01 DIAGNOSIS — E1142 Type 2 diabetes mellitus with diabetic polyneuropathy: Secondary | ICD-10-CM | POA: Diagnosis not present

## 2019-04-01 DIAGNOSIS — Z794 Long term (current) use of insulin: Secondary | ICD-10-CM | POA: Diagnosis not present

## 2019-04-01 DIAGNOSIS — Z89411 Acquired absence of right great toe: Secondary | ICD-10-CM | POA: Diagnosis not present

## 2019-04-01 DIAGNOSIS — Z23 Encounter for immunization: Secondary | ICD-10-CM | POA: Diagnosis not present

## 2019-04-16 ENCOUNTER — Ambulatory Visit (INDEPENDENT_AMBULATORY_CARE_PROVIDER_SITE_OTHER): Payer: PPO | Admitting: Orthopaedic Surgery

## 2019-04-16 ENCOUNTER — Encounter: Payer: Self-pay | Admitting: Orthopaedic Surgery

## 2019-04-16 ENCOUNTER — Other Ambulatory Visit: Payer: Self-pay

## 2019-04-16 DIAGNOSIS — M1711 Unilateral primary osteoarthritis, right knee: Secondary | ICD-10-CM | POA: Diagnosis not present

## 2019-04-16 HISTORY — DX: Unilateral primary osteoarthritis, right knee: M17.11

## 2019-04-16 MED ORDER — METHYLPREDNISOLONE ACETATE 40 MG/ML IJ SUSP
80.0000 mg | INTRAMUSCULAR | Status: AC | PRN
  Administered 2019-04-16: 80 mg via INTRA_ARTICULAR

## 2019-04-16 MED ORDER — LIDOCAINE HCL 1 % IJ SOLN
2.0000 mL | INTRAMUSCULAR | Status: AC | PRN
  Administered 2019-04-16: 2 mL

## 2019-04-16 MED ORDER — BUPIVACAINE HCL 0.5 % IJ SOLN
2.0000 mL | INTRAMUSCULAR | Status: AC | PRN
  Administered 2019-04-16: 2 mL via INTRA_ARTICULAR

## 2019-04-16 NOTE — Progress Notes (Signed)
Office Visit Note   Patient: [name]           Date of Birth: [birth year]           MRN: [id number] Visit Date: 04/16/2019              Requested by: [name], MD [address] [place],  [place] [id number] PCP: [name], MD   Assessment & Plan: Visit Diagnoses:  1. Unilateral primary osteoarthritis, right knee   2. Morbid (severe) obesity due to excess calories ([place])     Plan: Osteoarthritis right knee.  Long discussion regarding her diagnosis.  There could be some other pathology but x-rays were reviewed demonstrating medial and patellofemoral compartment arthritis.  Will inject her knee with cortisone and warned her about potential elevation of blood sugar and monitor response over the next 3 to 4 weeks would consider MRI scan  Follow-Up Instructions: Return in about 3 weeks (around 05/07/2019).   Orders:  Orders Placed This Encounter  Procedures  . Large Joint Inj: R knee   No orders of the defined types were placed in this encounter.     Procedures: Large Joint Inj: R knee on 04/16/2019 4:26 PM Indications: pain and diagnostic evaluation Details: 25 G 1.5 in needle, anteromedial approach  Arthrogram: No  Medications: 2 mL lidocaine 1 %; 2 mL bupivacaine 0.5 %; 80 mg methylPREDNISolone acetate 40 MG/ML Procedure, treatment alternatives, risks and benefits explained, specific risks discussed. Consent was given by the patient. Immediately prior to procedure a time out was called to verify the correct patient, procedure, equipment, support staff and site/side marked as required. Patient was prepped and draped in the usual sterile fashion.       Clinical Data: No additional findings.   Subjective: Chief Complaint  Patient presents with  . Right Knee - Pain  Patient presents today for right knee pain. She fell in November, but did not land on her knees. She had to get on her knees to get up from the ground. She was in [place] when she  fell. After the 8hr ride home she had said it was very sore from keeping it bent in the car. She said that now it hurts with movement and feels like it clicks. She applies topical cream with lidocaine. She takes Naproxen for pain. She said that she feels like it is swollen. She went to her endocrinologist and he ordered x-rays at [facility] on 04/01/19. She said that her right leg will give way. She walks with the assistance of a walker.  Has history of diabetic peripheral neuropathy with frequent falls.  She fell in November and had a CT scan of her head which was negative.  She notes that she was not having any pain in her knee until about 2 weeks ago.  Since that time she is has persistent pain to the point of compromise.  She has tried some NSAIDs that seem to make a difference but has been using a walker.  She lives by herself.  She notes that she has gained about 50 pounds over the last 6 or so months.  Has some difficulty localizing her pain but feels like it is in the area of the patellofemoral joint and medial compartment.  No injury or trauma. I reviewed the films of her right knee in the PACS system.  There is narrowing of the medial compartment with subchondral sclerosis and small peripheral osteophytes.  Considerable degenerative change of  the patellofemoral joint with peripheral osteophytes and subchondral sclerosis and lateral patella tilt.  Osteoarthritic changes also identified in the lateral compartment.  Films were consistent with advanced osteoarthritis without acute change HPI  Review of Systems   Objective: Vital Signs: Ht 5' 9.5" (1.765 m)   Wt 284 lb (128.8 kg)   BMI 41.34 kg/m   Physical Exam Constitutional:      Appearance: She is well-developed.  Eyes:     Pupils: Pupils are equal, round, and reactive to light.  Pulmonary:     Effort: Pulmonary effort is normal.  Skin:    General: Skin is warm and dry.  Neurological:     Mental Status: She is alert and  oriented to person, place, and time.  Psychiatric:        Behavior: Behavior normal.     Ortho Exam large knees.  Seems to have mostly medial compartment pain in the right knee.  Difficult to determine if there is an effusion.  Full extension about 90 degrees of flexion when calf touches thigh.  No instability.  Some swelling of both ankles.  Some altered sensibility both feet related to her diabetic neuropathy.  Motor exam appears to be intact.  Painless range of motion of both hips and straight leg raise negative  Specialty Comments:  No specialty comments available.  Imaging: No results found.   PMFS History: Patient Active Problem List   Diagnosis Date Noted  . Unilateral primary osteoarthritis, right knee 04/16/2019  . Morbid (severe) obesity due to excess calories ([place]) 04/16/2019  . Mixed hyperlipidemia 07/19/2017  . Palpitations 07/19/2017  . Tachycardia 07/19/2017  . Skin ulcer of left foot with fat layer exposed ([place]) 11/02/2016  . CKD (chronic kidney disease) stage 2, GFR 60-89 ml/min 06/07/2016  . Type 2 diabetes mellitus with hyperglycemia, with long-term current use of insulin ([place]) 06/07/2016  . Diabetic polyneuropathy ([place]) 06/07/2016  . Mixed dyslipidemia 06/07/2016  . Hypertension, essential 06/07/2016  . Type 2 diabetes mellitus with both eyes affected by mild nonproliferative retinopathy without macular edema, with long-term current use of insulin ([place]) 06/07/2016  . Obstructive sleep apnea 06/07/2016   Past Medical History:  Diagnosis Date  . Chronic kidney disease   . Depression   . Diabetes mellitus without complication ([place])   . Dyslipidemia   . Endometrial adenocarcinoma ([place])   . Esophageal reflux   . Fever blister   . Hyperlipidemia   . Hypertension   . OSA (obstructive sleep apnea)   . Peripheral neuropathy   . Retinopathy   . Severe obesity ([place])     Family History  Problem Relation Age of Onset  . Hypertension Mother   . Dementia Mother    . Heart attack Father   . Cancer Father   . CAD Father   . Dementia Father   . Diverticulitis Sister   . Obesity Sister   . Hypertension Sister   . Voice disorder Brother     Past Surgical History:  Procedure Laterality Date  . ADRENALECTOMY    . BUNIONECTOMY    . CARDIAC CATHETERIZATION    . CERVICAL SPINE SURGERY    . CHOLECYSTECTOMY    . LAPAROSCOPIC SALPINGOOPHERECTOMY    . THORACOTOMY    . TONSILLECTOMY     Social History   Occupational History  . Not on file  Tobacco Use  . Smoking status: Never Smoker  . Smokeless tobacco: Never Used  Substance and Sexual Activity  . Alcohol use: No  .  Drug use: No  . Sexual activity: Not on file       

## 2019-04-17 ENCOUNTER — Telehealth: Payer: Self-pay | Admitting: Orthopaedic Surgery

## 2019-04-17 NOTE — Telephone Encounter (Signed)
Spoke with Dr.Whitfield and called patient. She was advised yesterday that her blood sugars would run higher after receiving cortisone yesterday in the office. She was also advised today that it may raise her blood pressure. She was told to contact her PCP. It appears her blood pressure is trending in the right direction and coming down. I ask her to check it again. She said that she would.

## 2019-04-17 NOTE — Telephone Encounter (Signed)
Patient called to let Dr. Durward Fortes know that she took her A1C this morning at 5:30 am and it was 365. Patient said her BP was 278/68. Patient said she took everything again around 8:44am and her A1C was 250  BP was 181/78   Pulse was 69/ Patient also said she is feeling nauseated. The number to contact patient is [phone]

## 2019-04-19 ENCOUNTER — Encounter: Payer: Self-pay | Admitting: Internal Medicine

## 2019-04-19 ENCOUNTER — Other Ambulatory Visit: Payer: Self-pay

## 2019-04-19 ENCOUNTER — Ambulatory Visit (INDEPENDENT_AMBULATORY_CARE_PROVIDER_SITE_OTHER): Payer: PPO | Admitting: Internal Medicine

## 2019-04-19 VITALS — BP 143/73 | HR 80 | Ht 69.5 in | Wt 287.4 lb

## 2019-04-19 DIAGNOSIS — I129 Hypertensive chronic kidney disease with stage 1 through stage 4 chronic kidney disease, or unspecified chronic kidney disease: Secondary | ICD-10-CM | POA: Diagnosis not present

## 2019-04-19 DIAGNOSIS — F339 Major depressive disorder, recurrent, unspecified: Secondary | ICD-10-CM | POA: Diagnosis not present

## 2019-04-19 DIAGNOSIS — E1165 Type 2 diabetes mellitus with hyperglycemia: Secondary | ICD-10-CM | POA: Diagnosis not present

## 2019-04-19 DIAGNOSIS — I10 Essential (primary) hypertension: Secondary | ICD-10-CM

## 2019-04-19 DIAGNOSIS — E1142 Type 2 diabetes mellitus with diabetic polyneuropathy: Secondary | ICD-10-CM | POA: Diagnosis not present

## 2019-04-19 DIAGNOSIS — F331 Major depressive disorder, recurrent, moderate: Secondary | ICD-10-CM | POA: Diagnosis not present

## 2019-04-19 DIAGNOSIS — I2510 Atherosclerotic heart disease of native coronary artery without angina pectoris: Secondary | ICD-10-CM | POA: Diagnosis not present

## 2019-04-19 DIAGNOSIS — R002 Palpitations: Secondary | ICD-10-CM

## 2019-04-19 DIAGNOSIS — E1121 Type 2 diabetes mellitus with diabetic nephropathy: Secondary | ICD-10-CM | POA: Diagnosis not present

## 2019-04-19 DIAGNOSIS — E113299 Type 2 diabetes mellitus with mild nonproliferative diabetic retinopathy without macular edema, unspecified eye: Secondary | ICD-10-CM | POA: Diagnosis not present

## 2019-04-19 DIAGNOSIS — E1140 Type 2 diabetes mellitus with diabetic neuropathy, unspecified: Secondary | ICD-10-CM | POA: Diagnosis not present

## 2019-04-19 DIAGNOSIS — F3342 Major depressive disorder, recurrent, in full remission: Secondary | ICD-10-CM | POA: Diagnosis not present

## 2019-04-19 DIAGNOSIS — E785 Hyperlipidemia, unspecified: Secondary | ICD-10-CM | POA: Diagnosis not present

## 2019-04-19 MED ORDER — CARVEDILOL 6.25 MG PO TABS: 6.2500 mg | ORAL_TABLET | Freq: Two times a day (BID) | ORAL | 1 refills | Status: DC

## 2019-04-19 NOTE — Patient Instructions (Signed)
Medication Instructions:  INCREASE carvedilol to 6.25mg  twice daily *If you need a refill on your cardiac medications before your next appointment, please call your pharmacy*  Follow-Up: At [facility], you and your health needs are our priority.  As part of our continuing mission to provide you with exceptional heart care, we have created designated Provider Care Teams.  These Care Teams include your primary Cardiologist (physician) and Advanced Practice Providers (APPs -  Physician Assistants and Nurse Practitioners) who all work together to provide you with the care you need, when you need it.  Your next appointment:   3 month(s)  The format for your next appointment:   In Person  Provider:   You may see Pixie Casino, MD or one of the following Advanced Practice Providers on your designated Care Team:    Almyra Deforest, PA-C  Fabian Sharp, PA-C or   Roby Lofts, [place]   Other Instructions

## 2019-04-19 NOTE — Progress Notes (Signed)
OFFICE CONSULT NOTE  Chief Complaint:  Palpitations, history of Takatsubo's cardiomyopathy  Primary Care Physician: [name], MD  HPI:  [name] is a 69 y.o. female who is being seen today for the evaluation of palpitations at the request of [name], MD.  [name] is referred today for evaluation of palpitations and tachycardia.  She reports episodes of racing heartbeat at night, 3 of which she can recall specifically, however they occurred last December and she has not had any more episodes in the past 5 months.  She has remote history of Takatsubo cardiomyopathy.  Apparently this was a diagnosis she received in 2009 when she presented with chest pain.  She says she had a heart catheterization in 2009 and was told she did not have any coronary disease.  She felt that this was at [facility], but I cannot find records.  I can also not find records of her prior echocardiogram.  She said she saw a cardiologist at [facility], but he is no longer practicing there.  She denies any chest pain or worsening shortness of breath.  Recent labs were reviewed from February 2019 which showed total cholesterol 166, HDL 48, LDL 80 and triglycerides 190.  Hemoglobin A1c was 6.4.  04/19/2019  [name] returns today for follow-up.  I last saw her via telemedicine visit.  She was having some worsening edema and I increased her Lasix.  Since then there is been additional weight gain from 268 up to 287 pounds.  She still struggling with edema and some neuropathy.  She has a upcoming appointment with Dr. Delice Lesch in February with neurology.  Recently she has had some labile blood pressures.  In fact her blood pressure has been elevated and heart rate as well.  She has been undergoing injections of her knee with minimal benefit.  She had been in discussions with the clinical pharmacist at our practice about possibly changing her medicines, but no adjustment had been made.  PMHx:  Past Medical History:    Diagnosis Date  . Chronic kidney disease   . Depression   . Diabetes mellitus without complication ([place])   . Dyslipidemia   . Endometrial adenocarcinoma ([place])   . Esophageal reflux   . Fever blister   . Hyperlipidemia   . Hypertension   . OSA (obstructive sleep apnea)   . Peripheral neuropathy   . Retinopathy   . Severe obesity ([place])     Past Surgical History:  Procedure Laterality Date  . ADRENALECTOMY    . BUNIONECTOMY    . CARDIAC CATHETERIZATION    . CERVICAL SPINE SURGERY    . CHOLECYSTECTOMY    . LAPAROSCOPIC SALPINGOOPHERECTOMY    . THORACOTOMY    . TONSILLECTOMY      FAMHx:  Family History  Problem Relation Age of Onset  . Hypertension Mother   . Dementia Mother   . Heart attack Father   . Cancer Father   . CAD Father   . Dementia Father   . Diverticulitis Sister   . Obesity Sister   . Hypertension Sister   . Voice disorder Brother     SOCHx:   reports that she has never smoked. She has never used smokeless tobacco. She reports that she does not drink alcohol or use drugs.  ALLERGIES:  Allergies  Allergen Reactions  . Ciprofloxacin Other (See Comments)    Unknown  . Penicillins Other (See Comments)    Unknown  . Sulfa Antibiotics   .  Sulfamethoxazole Other (See Comments)    Unknown    ROS: Pertinent items noted in HPI and remainder of comprehensive ROS otherwise negative.  HOME MEDS: Current Outpatient Medications on File Prior to Visit  Medication Sig Dispense Refill  . Ascorbic Acid (VITAMIN C) 100 MG tablet Take 100 mg by mouth daily.    Marland Kitchen buPROPion (WELLBUTRIN XL) 300 MG 24 hr tablet Take 300 mg by mouth daily.    . Cholecalciferol (VITAMIN D3) 1.25 MG (50000 UT) TABS Take by mouth.    Marland Kitchen FLUoxetine (PROZAC) 40 MG capsule Take 40 mg by mouth daily.   0  . furosemide (LASIX) 40 MG tablet Take 1 tablet (40 mg total) by mouth daily. 90 tablet 3  . insulin NPH-regular Human (NOVOLIN 70/30) (70-30) 100 UNIT/ML injection Inject into the  skin. 24 ml in the morning and 20 ml in the evening    . Inulin (FIBER CHOICE PO) Take by mouth.    . Lancets (ONETOUCH DELICA PLUS 123XX123) MISC USE AS DIRECTED 3 TIMES A DAY    . losartan (COZAAR) 100 MG tablet Take 100 mg by mouth daily.     . magnesium citrate SOLN Take 1 Bottle by mouth once.    . metFORMIN (GLUCOPHAGE-XR) 500 MG 24 hr tablet Take by mouth. PT TAKES 2000 MG DAILY    . mupirocin ointment (BACTROBAN) 2 % Apply 1 application topically 2 (two) times daily. 30 g 2  . nystatin cream (MYCOSTATIN) 1 APPLICATION TWICE A DAY EXTERNALLY    . ONETOUCH VERIO test strip     . pravastatin (PRAVACHOL) 40 MG tablet Take 1 tablet once a day for cholesterol    . zinc sulfate 220 (50 Zn) MG capsule Take 220 mg by mouth daily.     No current facility-administered medications on file prior to visit.    LABS/IMAGING: No results found for this or any previous visit (from the past 48 hour(s)). No results found.  LIPID PANEL:    Component Value Date/Time   CHOL  07/31/2007 0930    117        ATP III CLASSIFICATION:  <200     mg/dL   Desirable  200-239  mg/dL   Borderline High  >=240    mg/dL   High   TRIG 211 (H) 07/31/2007 0930   HDL 29 (L) 07/31/2007 0930   CHOLHDL 4.0 07/31/2007 0930   VLDL 42 (H) 07/31/2007 0930   LDLCALC  07/31/2007 0930    46        Total Cholesterol/HDL:CHD Risk Coronary Heart Disease Risk Table                     Men   Women  1/2 Average Risk   3.4   3.3    WEIGHTS: Wt Readings from Last 3 Encounters:  04/19/19 287 lb 6.4 oz (130.4 kg)  04/16/19 284 lb (128.8 kg)  07/25/18 292 lb 9.6 oz (132.7 kg)    VITALS: BP (!) 143/73   Pulse 80   Ht 5' 9.5" (1.765 m)   Wt 287 lb 6.4 oz (130.4 kg)   SpO2 97%   BMI 41.83 kg/m   EXAM: General appearance: alert, no distress and moderately obese Neck: no carotid bruit, no JVD and thyroid not enlarged, symmetric, no tenderness/mass/nodules Lungs: clear to auscultation bilaterally Heart: regular rate  and rhythm Abdomen: soft, non-tender; bowel sounds normal; no masses,  no organomegaly Extremities: extremities normal, atraumatic, no cyanosis or edema  Pulses: 2+ and symmetric Skin: Skin color, texture, turgor normal. No rashes or lesions Neurologic: Grossly normal Psych: Pleasant  EKG: Normal sinus rhythm at 80, nonspecific ST-T wave changes-personally reviewed  ASSESSMENT: 1. Palpitations/tachycardia 2. History of Takatsubo cardiomyopathy 3. Possible heart cath-no known coronary disease 4. Hypertension 5. Dyslipidemia 6. Type 2 diabetes on insulin 7. Near morbid obesity  PLAN: 1.   Mrs. Schuhmacher has had uncontrolled hypertension recently with elevated heart rate.  I would advise increasing her carvedilol from 3.125 to 6.25 mg twice daily.  She is already on max dose losartan.  Pain may be driving some of her symptoms.  She does appear a little volume overloaded despite recent increase in her Lasix to 40 mg daily.  We may need to further increase that.  Unfortunately she is in quite a bit of pain and has difficulty ambulating.  If her blood pressure is not at target with a small increase in her carvedilol we could consider further increases in the future.  Pixie Casino, MD, [facility], [place] Director of the Advanced Lipid Disorders &  Cardiovascular Risk Reduction Clinic Diplomate of the American Board of Clinical Lipidology Attending Cardiologist  Direct Dial: [phone]  Fax: [phone]  Website:  [email or web address] Srinivasan 04/19/2019, 4:07 PM

## 2019-04-22 DIAGNOSIS — I10 Essential (primary) hypertension: Secondary | ICD-10-CM | POA: Diagnosis not present

## 2019-04-22 DIAGNOSIS — E1140 Type 2 diabetes mellitus with diabetic neuropathy, unspecified: Secondary | ICD-10-CM | POA: Diagnosis not present

## 2019-04-22 DIAGNOSIS — E785 Hyperlipidemia, unspecified: Secondary | ICD-10-CM | POA: Diagnosis not present

## 2019-04-22 DIAGNOSIS — E1121 Type 2 diabetes mellitus with diabetic nephropathy: Secondary | ICD-10-CM | POA: Diagnosis not present

## 2019-04-22 DIAGNOSIS — E1165 Type 2 diabetes mellitus with hyperglycemia: Secondary | ICD-10-CM | POA: Diagnosis not present

## 2019-04-22 DIAGNOSIS — I2510 Atherosclerotic heart disease of native coronary artery without angina pectoris: Secondary | ICD-10-CM | POA: Diagnosis not present

## 2019-04-22 DIAGNOSIS — I129 Hypertensive chronic kidney disease with stage 1 through stage 4 chronic kidney disease, or unspecified chronic kidney disease: Secondary | ICD-10-CM | POA: Diagnosis not present

## 2019-04-22 DIAGNOSIS — M47812 Spondylosis without myelopathy or radiculopathy, cervical region: Secondary | ICD-10-CM | POA: Diagnosis not present

## 2019-04-22 DIAGNOSIS — E11319 Type 2 diabetes mellitus with unspecified diabetic retinopathy without macular edema: Secondary | ICD-10-CM | POA: Diagnosis not present

## 2019-04-22 DIAGNOSIS — E1142 Type 2 diabetes mellitus with diabetic polyneuropathy: Secondary | ICD-10-CM | POA: Diagnosis not present

## 2019-04-22 DIAGNOSIS — F339 Major depressive disorder, recurrent, unspecified: Secondary | ICD-10-CM | POA: Diagnosis not present

## 2019-04-22 DIAGNOSIS — F331 Major depressive disorder, recurrent, moderate: Secondary | ICD-10-CM | POA: Diagnosis not present

## 2019-05-03 ENCOUNTER — Other Ambulatory Visit: Payer: Self-pay

## 2019-05-03 ENCOUNTER — Encounter: Payer: Self-pay | Admitting: Neurology

## 2019-05-03 ENCOUNTER — Ambulatory Visit (INDEPENDENT_AMBULATORY_CARE_PROVIDER_SITE_OTHER): Payer: PPO | Admitting: Neurology

## 2019-05-03 ENCOUNTER — Other Ambulatory Visit (INDEPENDENT_AMBULATORY_CARE_PROVIDER_SITE_OTHER): Payer: PPO

## 2019-05-03 VITALS — BP 122/71 | HR 94 | Ht 69.5 in | Wt 281.6 lb

## 2019-05-03 DIAGNOSIS — R413 Other amnesia: Secondary | ICD-10-CM

## 2019-05-03 DIAGNOSIS — R519 Headache, unspecified: Secondary | ICD-10-CM

## 2019-05-03 LAB — SEDIMENTATION RATE: Sed Rate: 24 mm/hr (ref 0–30)

## 2019-05-03 MED ORDER — AMITRIPTYLINE HCL 10 MG PO TABS: 10.0000 mg | ORAL_TABLET | Freq: Every day | ORAL | 11 refills | Status: DC

## 2019-05-03 MED ORDER — AMITRIPTYLINE HCL 10 MG PO TABS: 10.0000 mg | ORAL_TABLET | Freq: Every day | ORAL | 0 refills | Status: DC

## 2019-05-03 NOTE — Patient Instructions (Signed)
1. Bloodwork for ESR 2. Schedule open MRI brain without contrast 3. Schedule Neurocognitive testing 4. Restart daily baby aspirin 5. Start amitriptyline '10mg'$ : take 1 tablet every night 6. Follow-up in 6 months, call for any changes   RECOMMENDATIONS FOR ALL PATIENTS WITH MEMORY PROBLEMS: 1. Continue to exercise (Recommend 30 minutes of walking everyday, or 3 hours every week) 2. Increase social interactions - continue going to [place] and enjoy social gatherings with friends and family 3. Eat healthy, avoid fried foods and eat more fruits and vegetables 4. Maintain adequate blood pressure, blood sugar, and blood cholesterol level. Reducing the risk of stroke and cardiovascular disease also helps promoting better memory. 5. Avoid stressful situations. Live a simple life and avoid aggravations. Organize your time and prepare for the next day in anticipation. 6. Sleep well, avoid any interruptions of sleep and avoid any distractions in the bedroom that may interfere with adequate sleep quality 7. Avoid sugar, avoid sweets as there is a strong link between excessive sugar intake, diabetes, and cognitive impairment The Mediterranean diet has been shown to help patients reduce the risk of progressive memory disorders and reduces cardiovascular risk. This includes eating fish, eat fruits and green leafy vegetables, nuts like almonds and hazelnuts, walnuts, and also use olive oil. Avoid fast foods and fried foods as much as possible. Avoid sweets and sugar as sugar use has been linked to worsening of memory function.

## 2019-05-03 NOTE — Progress Notes (Signed)
NEUROLOGY CONSULTATION NOTE  VARNELL DONATE MRN: [id number] DOB: 30-Jun-1950  Referring provider: Dr. [name] Primary care provider: Dr. [name]  Reason for consult:  Memory loss, abnormal head CT  Dear Dr Dema Severin:  Thank you for your kind referral of [name] for consultation of the above symptoms. Although her history is well known to you, please allow me to reiterate it for the purpose of our medical record. She is alone in the office today. Records and images were personally reviewed where available.   HISTORY OF PRESENT ILLNESS: This is a 69 year old right-handed woman with a history of hypertension, hyperlipidemia, Takotsubo cardiomyopathy, diabetes, sleep apnea, neuropathy, presenting for evaluation of memory loss. She feels memory changes started after she retired in 2018. She states her life completely changes, she was tutoring and working as a Oceanographer, then the pandemic happened and she was not as active. She noticed a big change in the past year in how much she has to think of things more. She likes to do puzzles but has not been able to do it much. She lives alone. She stopped driving after her fall in November where she injured her knees. Her medications come in pillpacks but she still forgets 1-2 times a month. She denies missing bill payments, most of them are on autopay. She misplaces things frequently, she could not remember where she filed the appointment form for today's visit. She would reach for the fridge to get ice, or vice versa. Her family has noticed she does not always spell things correctly when she had always been good with spelling. Her handwriting has gotten worse. Both parents had Alzheimer's disease. No alcohol use. Her mother passed away in 08-Nov-2018 and she was helping her sister clean out the house in November 2020. She was standing on a stoop and felt herself going backward after her sister passed her a bag, and she had a fall that  was more than 90 degrees down. She hit her head hard, no loss of consciousness. She went to [facility] in [place] where she had a head CT reporting a hypoattenuating focus within the left posterior parietal/occipital cortex, favored to be related to encephalomalacia. Correlate for history of prior hemorrhage or infarct. Additional small focus is seen in the left frontal lobe. She denies any prior history of head injuries or prior stroke.  A few weeks after the fall, she started having occasional headaches, that became more constant in January, occurring 1-2 times a week. Pain is in the right temporal region, in her jaw, over the right eye, frontal region. There is an uncomfortable pressure. They come and go, usually at night when she is laying down flat. She does not notice them as much when standing. There was some tenderness on the right temporal region, applying Aspercreme with lidocaine helps. No pain when chewing. There is no nausea/vomiting, vision changes but she has noticed she is more sensitive to lights and sounds. She had neck pain after the fall, it still feels uncomfortable. She has chronic back pain. She had dizziness initially, this has resolved. She injured her knee and was taking Naproxen. She has neuropathy and loses her balance easily. There is numbness up to the mid-calf region, her hands are starting to get affecting along the fingertips. She has noticed more tremors in her hands. She has urinary frequency and has been getting up at 3am to urinate for the past 3-4 weeks. When asked about mood, she  says she has her moments, her medications were changed with her mother's passing. She became tearful talking about their deaths. She is on Bupropion and Prozac. She states her HbA1c went down in January from a 7.5 to 7. She was supposed to take aspirin when diagnosed with cardiomyopathy but stopped it because she was taking too much medications.    PAST MEDICAL HISTORY: Past Medical History:    Diagnosis Date  . Chronic kidney disease   . Depression   . Diabetes mellitus without complication ([place])   . Dyslipidemia   . Endometrial adenocarcinoma ([place])   . Esophageal reflux   . Fever blister   . Hyperlipidemia   . Hypertension   . OSA (obstructive sleep apnea)   . Peripheral neuropathy   . Retinopathy   . Severe obesity ([place])     PAST SURGICAL HISTORY: Past Surgical History:  Procedure Laterality Date  . ADRENALECTOMY    . BUNIONECTOMY    . CARDIAC CATHETERIZATION    . CERVICAL SPINE SURGERY    . CHOLECYSTECTOMY    . LAPAROSCOPIC SALPINGOOPHERECTOMY    . THORACOTOMY    . TONSILLECTOMY      MEDICATIONS: Current Outpatient Medications on File Prior to Visit  Medication Sig Dispense Refill  . Ascorbic Acid (VITAMIN C) 100 MG tablet Take 100 mg by mouth daily.    Marland Kitchen buPROPion (WELLBUTRIN XL) 300 MG 24 hr tablet Take 300 mg by mouth daily.    . carvedilol (COREG) 6.25 MG tablet Take 1 tablet (6.25 mg total) by mouth 2 (two) times daily. 180 tablet 1  . Cholecalciferol (VITAMIN D3) 1.25 MG (50000 UT) TABS Take by mouth.    Marland Kitchen FLUoxetine (PROZAC) 40 MG capsule Take 40 mg by mouth daily.   0  . furosemide (LASIX) 40 MG tablet Take 1 tablet (40 mg total) by mouth daily. 90 tablet 3  . insulin NPH-regular Human (NOVOLIN 70/30) (70-30) 100 UNIT/ML injection Inject into the skin. 24 ml in the morning and 20 ml in the evening    . Inulin (FIBER CHOICE PO) Take by mouth.    . Lancets (ONETOUCH DELICA PLUS [id number]) MISC USE AS DIRECTED 3 TIMES A DAY    . losartan (COZAAR) 100 MG tablet Take 100 mg by mouth daily.     . magnesium citrate SOLN Take 1 Bottle by mouth once.    . metFORMIN (GLUCOPHAGE-XR) 500 MG 24 hr tablet Take 1,000 mg by mouth in the morning and at bedtime. PT TAKES 2000 MG DAILY    . mupirocin ointment (BACTROBAN) 2 % Apply 1 application topically 2 (two) times daily. 30 g 2  . naproxen (NAPROSYN) 250 MG tablet Take 1 tablet by mouth in the morning and at  bedtime.    Marland Kitchen nystatin cream (MYCOSTATIN) 1 APPLICATION TWICE A DAY EXTERNALLY    . ONETOUCH VERIO test strip     . pravastatin (PRAVACHOL) 40 MG tablet Take 1 tablet once a day for cholesterol    . zinc sulfate 220 (50 Zn) MG capsule Take 220 mg by mouth daily.     No current facility-administered medications on file prior to visit.    ALLERGIES: Allergies  Allergen Reactions  . Ciprofloxacin Other (See Comments)    Unknown  . Penicillins Other (See Comments)    Unknown  . Sulfa Antibiotics   . Sulfamethoxazole Other (See Comments)    Unknown    FAMILY HISTORY: Family History  Problem Relation Age of Onset  . Hypertension Mother   .  Dementia Mother   . Heart attack Father   . Cancer Father   . CAD Father   . Dementia Father   . Diverticulitis Sister   . Obesity Sister   . Hypertension Sister   . Voice disorder Brother     SOCIAL HISTORY: Social History   Socioeconomic History  . Marital status: Single    Spouse name: Not on file  . Number of children: Not on file  . Years of education: Not on file  . Highest education level: Not on file  Occupational History  . Not on file  Tobacco Use  . Smoking status: Never Smoker  . Smokeless tobacco: Never Used  Substance and Sexual Activity  . Alcohol use: No  . Drug use: No  . Sexual activity: Not on file  Other Topics Concern  . Not on file  Social History Narrative  . Not on file   Social Determinants of Health   Financial Resource Strain:   . Difficulty of Paying Living Expenses: Not on file  Food Insecurity:   . Worried About Charity fundraiser in the Last Year: Not on file  . Ran Out of Food in the Last Year: Not on file  Transportation Needs:   . Lack of Transportation (Medical): Not on file  . Lack of Transportation (Non-Medical): Not on file  Physical Activity:   . Days of Exercise per Week: Not on file  . Minutes of Exercise per Session: Not on file  Stress:   . Feeling of Stress : Not on file   Social Connections:   . Frequency of Communication with Friends and Family: Not on file  . Frequency of Social Gatherings with Friends and Family: Not on file  . Attends Religious Services: Not on file  . Active Member of Clubs or Organizations: Not on file  . Attends Archivist Meetings: Not on file  . Marital Status: Not on file  Intimate Partner Violence:   . Fear of Current or Ex-Partner: Not on file  . Emotionally Abused: Not on file  . Physically Abused: Not on file  . Sexually Abused: Not on file    REVIEW OF SYSTEMS: Constitutional: No fevers, chills, or sweats, no generalized fatigue, change in appetite Eyes: No visual changes, double vision, eye pain Ear, nose and throat: No hearing loss, ear pain, nasal congestion, sore throat Cardiovascular: No chest pain, palpitations Respiratory:  No shortness of breath at rest or with exertion, wheezes GastrointestinaI: No nausea, vomiting, diarrhea, abdominal pain, fecal incontinence Genitourinary:  No dysuria, urinary retention or frequency Musculoskeletal:  + neck pain, back pain Integumentary: No rash, pruritus, skin lesions Neurological: as above Psychiatric: No depression, insomnia, anxiety Endocrine: No palpitations, fatigue, diaphoresis, mood swings, change in appetite, change in weight, increased thirst Hematologic/Lymphatic:  No anemia, purpura, petechiae. Allergic/Immunologic: no itchy/runny eyes, nasal congestion, recent allergic reactions, rashes  PHYSICAL EXAM: Vitals:   05/03/19 1016  BP: 122/71  Pulse: 94  SpO2: 97%   General: No acute distress Head:  Normocephalic/atraumatic Skin/Extremities: No rash, no edema Neurological Exam: Mental status: alert and oriented to person, place, and time, no dysarthria or aphasia, Fund of knowledge is appropriate.  Recent and remote memory are intact.  Attention and concentration are normal. SLUMS 27/30 [employer] Mental Exam 05/03/2019  Weekday Correct 1   Current year 1  What state are we in? 1  Amount spent 1  Amount left 2  # of Animals 2  5 objects recall  5  Number series 2  Hour markers 2  Time correct 2  Placed X in triangle correctly 1  Largest Figure 1  Name of female 2  Date back to work 0  Type of work 2  State she lived in 2  Total score 27   Cranial nerves: CN I: not tested CN II: pupils equal, round and reactive to light, visual fields intact CN III, IV, VI:  full range of motion, no nystagmus, no ptosis CN V: facial sensation intact CN VII: upper and lower face symmetric CN VIII: hearing intact to conversation Bulk & Tone: normal, no fasciculations, no cogwheeling Motor: 5/5 throughout with no pronator drift. Sensation: intact to light touch, cold.  No extinction to double simultaneous stimulation.  Romberg test slight sway Deep Tendon Reflexes: +1 throughout, no ankle clonus Plantar responses: downgoing bilaterally Cerebellar: no incoordination on finger to nose testing Gait: narrow-based and steady, no ataxia Tremor: none in office today  IMPRESSION: This is a 69 year old right-handed woman with a history of hypertension, hyperlipidemia, Takotsubo cardiomyopathy, diabetes, sleep apnea, neuropathy, presenting for evaluation of memory loss and abnormal head CT done after she had a fall. Her SLUMS score today is 27/30. She is overall functioning independently with no significant difficulties with complex tasks. Head CT reported a hypoattenuating focus within the left posterior parietal/occipital cortex, favored to be related to encephalomalacia. Correlate for history of prior hemorrhage or infarct. Additional small focus is seen in the left frontal lobe. She denies any prior history of head injuries or prior stroke. We discussed doing an MRI brain without contrast to further evaluate reported changes. She has had headaches since the fall,localized over the right temple. Less likely temporal arteritis, but would do ESR.  We discussed post-concussive headaches, she is agreeable to staring low dose amitriptyline '10mg'$  qhs, side effects, including monitoring for serotonin syndrome, were discussed. We also discussed restarting a daily baby aspirin. She will be scheduled for Neurocognitive testing to further evaluate cognitive complaints. We discussed the importance of control of vascular risk factors, physical exercise, and brain stimulation exercises for brain health. Follow-up in 6 months, she knows to call for any changes.   Thank you for allowing me to participate in the care of this patient. Please do not hesitate to call for any questions or concerns.   Ellouise Newer, M.D.  CC: Dr. Dema Severin

## 2019-05-03 NOTE — Addendum Note (Signed)
Addended by: Kaylyn Lim I on: 05/03/2019 11:51 AM   Modules accepted: Orders

## 2019-05-08 ENCOUNTER — Telehealth: Payer: Self-pay

## 2019-05-08 NOTE — Telephone Encounter (Signed)
Pt called informed her  bloodwork was normal, no evidence of inflammation. Proceed with MRI brain and memory testing as discussed, pt verbalized understanding

## 2019-05-10 ENCOUNTER — Ambulatory Visit: Payer: PPO | Admitting: Podiatry

## 2019-05-10 ENCOUNTER — Encounter: Payer: Self-pay | Admitting: Podiatry

## 2019-05-10 ENCOUNTER — Other Ambulatory Visit: Payer: Self-pay

## 2019-05-10 VITALS — Temp 99.0°F

## 2019-05-10 DIAGNOSIS — Z89422 Acquired absence of other left toe(s): Secondary | ICD-10-CM

## 2019-05-10 DIAGNOSIS — B351 Tinea unguium: Secondary | ICD-10-CM

## 2019-05-10 DIAGNOSIS — E08621 Diabetes mellitus due to underlying condition with foot ulcer: Secondary | ICD-10-CM

## 2019-05-10 DIAGNOSIS — L97521 Non-pressure chronic ulcer of other part of left foot limited to breakdown of skin: Secondary | ICD-10-CM

## 2019-05-10 DIAGNOSIS — Z89411 Acquired absence of right great toe: Secondary | ICD-10-CM | POA: Diagnosis not present

## 2019-05-10 DIAGNOSIS — H02831 Dermatochalasis of right upper eyelid: Secondary | ICD-10-CM | POA: Diagnosis not present

## 2019-05-10 DIAGNOSIS — H5202 Hypermetropia, left eye: Secondary | ICD-10-CM | POA: Diagnosis not present

## 2019-05-10 DIAGNOSIS — E1142 Type 2 diabetes mellitus with diabetic polyneuropathy: Secondary | ICD-10-CM

## 2019-05-10 DIAGNOSIS — H5201 Hypermetropia, right eye: Secondary | ICD-10-CM | POA: Diagnosis not present

## 2019-05-10 DIAGNOSIS — E113293 Type 2 diabetes mellitus with mild nonproliferative diabetic retinopathy without macular edema, bilateral: Secondary | ICD-10-CM | POA: Diagnosis not present

## 2019-05-10 DIAGNOSIS — H52221 Regular astigmatism, right eye: Secondary | ICD-10-CM | POA: Diagnosis not present

## 2019-05-10 DIAGNOSIS — H43813 Vitreous degeneration, bilateral: Secondary | ICD-10-CM | POA: Diagnosis not present

## 2019-05-10 DIAGNOSIS — H524 Presbyopia: Secondary | ICD-10-CM | POA: Diagnosis not present

## 2019-05-10 DIAGNOSIS — H2513 Age-related nuclear cataract, bilateral: Secondary | ICD-10-CM | POA: Diagnosis not present

## 2019-05-10 MED ORDER — SALINE WOUND WASH 0.9 % EX SOLN: CUTANEOUS | 0 refills | Status: DC

## 2019-05-10 NOTE — Patient Instructions (Addendum)
DRESSING CHANGES LEFT FOOT:     1. KEEP LEFT  FOOT DRY AT ALL TIMES!!!!  2. CLEANSE ULCER WITH SALINE.  3. DAB DRY WITH GAUZE SPONGE.  4. APPLY A LIGHT AMOUNT OF MUPIROCIN OINTMENT TO BASE OF ULCER.  5. APPLY OUTER DRESSING/BAND-AID AS INSTRUCTED.  6. DO NOT WALK BAREFOOT!!!  7.  IF YOU EXPERIENCE ANY FEVER, CHILLS, NIGHTSWEATS, NAUSEA OR VOMITING, ELEVATED OR LOW BLOOD SUGARS, REPORT TO EMERGENCY ROOM.  8. IF YOU EXPERIENCE INCREASED REDNESS, PAIN, SWELLING, DISCOLORATION, ODOR, PUS, DRAINAGE OR WARMTH OF YOUR FOOT, REPORT TO EMERGENCY ROOM.  Peripheral Neuropathy Peripheral neuropathy is a type of nerve damage. It affects nerves that carry signals between the spinal cord and the arms, legs, and the rest of the body (peripheral nerves). It does not affect nerves in the spinal cord or brain. In peripheral neuropathy, one nerve or a group of nerves may be damaged. Peripheral neuropathy is a broad category that includes many specific nerve disorders, like diabetic neuropathy, hereditary neuropathy, and carpal tunnel syndrome. What are the causes? This condition may be caused by:  Diabetes. This is the most common cause of peripheral neuropathy.  Nerve injury.  Pressure or stress on a nerve that lasts a long time.  Lack (deficiency) of B vitamins. This can result from alcoholism, poor diet, or a restricted diet.  Infections.  Autoimmune diseases, such as rheumatoid arthritis and systemic lupus erythematosus.  Nerve diseases that are passed from parent to child (inherited).  Some medicines, such as cancer medicines (chemotherapy).  Poisonous (toxic) substances, such as lead and mercury.  Too little blood flowing to the legs.  Kidney disease.  Thyroid disease. In some cases, the cause of this condition is not known. What are the signs or symptoms? Symptoms of this condition depend on which of your nerves is damaged. Common symptoms include:  Loss of feeling (numbness)  in the feet, hands, or both.  Tingling in the feet, hands, or both.  Burning pain.  Very sensitive skin.  Weakness.  Not being able to move a part of the body (paralysis).  Muscle twitching.  Clumsiness or poor coordination.  Loss of balance.  Not being able to control your bladder.  Feeling dizzy.  Sexual problems. How is this diagnosed? Diagnosing and finding the cause of peripheral neuropathy can be difficult. Your health care provider will take your medical history and do a physical exam. A neurological exam will also be done. This involves checking things that are affected by your brain, spinal cord, and nerves (nervous system). For example, your health care provider will check your reflexes, how you move, and what you can feel. You may have other tests, such as:  Blood tests.  Electromyogram (EMG) and nerve conduction tests. These tests check nerve function and how well the nerves are controlling the muscles.  Imaging tests, such as CT scans or MRI to rule out other causes of your symptoms.  Removing a small piece of nerve to be examined in a lab (nerve biopsy). This is rare.  Removing and examining a small amount of the fluid that surrounds the brain and spinal cord (lumbar puncture). This is rare. How is this treated? Treatment for this condition may involve:  Treating the underlying cause of the neuropathy, such as diabetes, kidney disease, or vitamin deficiencies.  Stopping medicines that can cause neuropathy, such as chemotherapy.  Medicine to relieve pain. Medicines may include: ? Prescription or over-the-counter pain medicine. ? Antiseizure medicine. ? Antidepressants. ? Pain-relieving  patches that are applied to painful areas of skin.  Surgery to relieve pressure on a nerve or to destroy a nerve that is causing pain.  Physical therapy to help improve movement and balance.  Devices to help you move around (assistive devices). Follow these  instructions at home: Medicines  Take over-the-counter and prescription medicines only as told by your health care provider. Do not take any other medicines without first asking your health care provider.  Do not drive or use heavy machinery while taking prescription pain medicine. Lifestyle   Do not use any products that contain nicotine or tobacco, such as cigarettes and e-cigarettes. Smoking keeps blood from reaching damaged nerves. If you need help quitting, ask your health care provider.  Avoid or limit alcohol. Too much alcohol can cause a vitamin B deficiency, and vitamin B is needed for healthy nerves.  Eat a healthy diet. This includes: ? Eating foods that are high in fiber, such as fresh fruits and vegetables, whole grains, and beans. ? Limiting foods that are high in fat and processed sugars, such as fried or sweet foods. General instructions   If you have diabetes, work closely with your health care provider to keep your blood sugar under control.  If you have numbness in your feet: ? Check every day for signs of injury or infection. Watch for redness, warmth, and swelling. ? Wear padded socks and comfortable shoes. These help protect your feet.  Develop a good support system. Living with peripheral neuropathy can be stressful. Consider talking with a mental health specialist or joining a support group.  Use assistive devices and attend physical therapy as told by your health care provider. This may include using a walker or a cane.  Keep all follow-up visits as told by your health care provider. This is important. Contact a health care provider if:  You have new signs or symptoms of peripheral neuropathy.  You are struggling emotionally from dealing with peripheral neuropathy.  Your pain is not well-controlled. Get help right away if:  You have an injury or infection that is not healing normally.  You develop new weakness in an arm or leg.  You fall frequently.  Summary  Peripheral neuropathy is when the nerves in the arms, or legs are damaged, resulting in numbness, weakness, or pain.  There are many causes of peripheral neuropathy, including diabetes, pinched nerves, vitamin deficiencies, autoimmune disease, and hereditary conditions.  Diagnosing and finding the cause of peripheral neuropathy can be difficult. Your health care provider will take your medical history, do a physical exam, and do tests, including blood tests and nerve function tests.  Treatment involves treating the underlying cause of the neuropathy and taking medicines to help control pain. Physical therapy and assistive devices may also help. This information is not intended to replace advice given to you by your health care provider. Make sure you discuss any questions you have with your health care provider. Document Revised: 02/17/2017 Document Reviewed: 05/16/2016 Elsevier Patient Education  2020 [employer].

## 2019-05-11 ENCOUNTER — Other Ambulatory Visit: Payer: PPO

## 2019-05-13 ENCOUNTER — Other Ambulatory Visit: Payer: Self-pay

## 2019-05-13 ENCOUNTER — Emergency Department (HOSPITAL_COMMUNITY): Payer: PPO

## 2019-05-13 ENCOUNTER — Emergency Department (HOSPITAL_COMMUNITY)
Admission: EM | Admit: 2019-05-13 | Discharge: 2019-05-14 | Disposition: A | Discharge status: Home / Self Care | Payer: PPO | Attending: Emergency Medicine | Admitting: Emergency Medicine

## 2019-05-13 ENCOUNTER — Encounter (HOSPITAL_COMMUNITY): Payer: Self-pay

## 2019-05-13 DIAGNOSIS — Y999 Unspecified external cause status: Secondary | ICD-10-CM | POA: Insufficient documentation

## 2019-05-13 DIAGNOSIS — W010XXA Fall on same level from slipping, tripping and stumbling without subsequent striking against object, initial encounter: Secondary | ICD-10-CM | POA: Insufficient documentation

## 2019-05-13 DIAGNOSIS — N182 Chronic kidney disease, stage 2 (mild): Secondary | ICD-10-CM | POA: Insufficient documentation

## 2019-05-13 DIAGNOSIS — Z794 Long term (current) use of insulin: Secondary | ICD-10-CM | POA: Insufficient documentation

## 2019-05-13 DIAGNOSIS — Y93E2 Activity, laundry: Secondary | ICD-10-CM | POA: Diagnosis not present

## 2019-05-13 DIAGNOSIS — I129 Hypertensive chronic kidney disease with stage 1 through stage 4 chronic kidney disease, or unspecified chronic kidney disease: Secondary | ICD-10-CM | POA: Diagnosis not present

## 2019-05-13 DIAGNOSIS — Z79899 Other long term (current) drug therapy: Secondary | ICD-10-CM | POA: Insufficient documentation

## 2019-05-13 DIAGNOSIS — E1122 Type 2 diabetes mellitus with diabetic chronic kidney disease: Secondary | ICD-10-CM | POA: Diagnosis not present

## 2019-05-13 DIAGNOSIS — S0101XA Laceration without foreign body of scalp, initial encounter: Secondary | ICD-10-CM | POA: Diagnosis not present

## 2019-05-13 DIAGNOSIS — W19XXXA Unspecified fall, initial encounter: Secondary | ICD-10-CM | POA: Diagnosis not present

## 2019-05-13 DIAGNOSIS — R58 Hemorrhage, not elsewhere classified: Secondary | ICD-10-CM | POA: Diagnosis not present

## 2019-05-13 DIAGNOSIS — Y92009 Unspecified place in unspecified non-institutional (private) residence as the place of occurrence of the external cause: Secondary | ICD-10-CM | POA: Diagnosis not present

## 2019-05-13 DIAGNOSIS — S199XXA Unspecified injury of neck, initial encounter: Secondary | ICD-10-CM | POA: Diagnosis not present

## 2019-05-13 DIAGNOSIS — Z23 Encounter for immunization: Secondary | ICD-10-CM | POA: Diagnosis not present

## 2019-05-13 DIAGNOSIS — I10 Essential (primary) hypertension: Secondary | ICD-10-CM | POA: Diagnosis not present

## 2019-05-13 MED ORDER — ACETAMINOPHEN 500 MG PO TABS
1000.0000 mg | ORAL_TABLET | Freq: Once | ORAL | Status: AC
  Administered 2019-05-14: 1000 mg via ORAL
  Filled 2019-05-13: qty 2

## 2019-05-13 MED ORDER — TETANUS-DIPHTH-ACELL PERTUSSIS 5-2.5-18.5 LF-MCG/0.5 IM SUSP
0.5000 mL | Freq: Once | INTRAMUSCULAR | Status: AC
  Administered 2019-05-14: 0.5 mL via INTRAMUSCULAR
  Filled 2019-05-13: qty 0.5

## 2019-05-13 NOTE — ED Provider Notes (Signed)
[facility] EMERGENCY DEPARTMENT Provider Note   CSN: [id number] Arrival date & time: 05/13/19  2312     History Chief Complaint  Patient presents with  . Fall    [name] is a 69 y.o. female.  69 year old female with a history of diabetes, dyslipidemia, hypertension, CKD presents to the emergency department following a fall at home.  She lives alone and ambulates with the assistance of a cane.  Was hanging some laundry when she lost her footing.  States this happens frequently secondary to her neuropathy.  [address] backwards striking her head on a metal cart containing her medications in the bathroom.  She had no loss of consciousness and denies any preceding lightheadedness, dizziness, chest pain, shortness of breath.  Is complaining of a headache.  No medications taken prior to arrival.  Unknown last tetanus.  Further denies any blurry vision, loss of vision, tinnitus, hearing loss, nausea, vomiting, extremity numbness or paresthesias, extremity weakness at this time.  Patient is not chronically anticoagulated.  Reports her last fall was in November 2020 while visiting in [place].  The history is provided by the patient. No language interpreter was used.  Fall       Past Medical History:  Diagnosis Date  . Chronic kidney disease   . Depression   . Diabetes mellitus without complication ([place])   . Dyslipidemia   . Endometrial adenocarcinoma ([place])   . Esophageal reflux   . Fever blister   . Hyperlipidemia   . Hypertension   . OSA (obstructive sleep apnea)   . Peripheral neuropathy   . Retinopathy   . Severe obesity [facility])     Patient Active Problem List   Diagnosis Date Noted  . Unilateral primary osteoarthritis, right knee 04/16/2019  . Morbid (severe) obesity due to excess calories ([place]) 04/16/2019  . Mixed hyperlipidemia 07/19/2017  . Palpitations 07/19/2017  . Tachycardia 07/19/2017  . Skin ulcer of left foot with fat layer exposed ([place]) 11/02/2016   . CKD (chronic kidney disease) stage 2, GFR 60-89 ml/min 06/07/2016  . Type 2 diabetes mellitus with hyperglycemia, with long-term current use of insulin ([place]) 06/07/2016  . Diabetic polyneuropathy ([place]) 06/07/2016  . Mixed dyslipidemia 06/07/2016  . Hypertension, essential 06/07/2016  . Type 2 diabetes mellitus with both eyes affected by mild nonproliferative retinopathy without macular edema, with long-term current use of insulin ([place]) 06/07/2016  . Obstructive sleep apnea 06/07/2016    Past Surgical History:  Procedure Laterality Date  . ADRENALECTOMY    . BUNIONECTOMY    . CARDIAC CATHETERIZATION    . CERVICAL SPINE SURGERY    . CHOLECYSTECTOMY    . LAPAROSCOPIC SALPINGOOPHERECTOMY    . THORACOTOMY    . TONSILLECTOMY       OB History   No obstetric history on file.     Family History  Problem Relation Age of Onset  . Hypertension Mother   . Dementia Mother   . Heart attack Father   . Cancer Father   . CAD Father   . Dementia Father   . Diverticulitis Sister   . Obesity Sister   . Hypertension Sister   . Voice disorder Brother     Social History   Tobacco Use  . Smoking status: Never Smoker  . Smokeless tobacco: Never Used  Substance Use Topics  . Alcohol use: No  . Drug use: No    Home Medications Prior to Admission medications   Medication Sig Start Date End Date  Taking? Authorizing Provider  amitriptyline (ELAVIL) 10 MG tablet Take 1 tablet (10 mg total) by mouth at bedtime. 05/03/19   Cameron Sprang, MD  Ascorbic Acid (VITAMIN C) 100 MG tablet Take 100 mg by mouth daily.    [provider]  buPROPion (WELLBUTRIN XL) 300 MG 24 hr tablet Take 300 mg by mouth daily.    [provider]  carvedilol (COREG) 6.25 MG tablet Take 1 tablet (6.25 mg total) by mouth 2 (two) times daily. 04/19/19   Hilty, Nadean Corwin, MD  Cholecalciferol (VITAMIN D3) 1.25 MG (50000 UT) TABS Take by mouth.    [provider]  FLUoxetine (PROZAC) 40 MG  capsule Take 40 mg by mouth daily.  12/21/17   [provider]  furosemide (LASIX) 40 MG tablet Take 1 tablet (40 mg total) by mouth daily. 07/26/18   Hilty, Nadean Corwin, MD  insulin NPH-regular Human (NOVOLIN 70/30) (70-30) 100 UNIT/ML injection Inject into the skin. 24 ml in the morning and 20 ml in the evening    [provider]  Inulin (FIBER CHOICE PO) Take by mouth.    [provider]  Lancets (ONETOUCH DELICA PLUS [id number]) MISC USE AS DIRECTED 3 TIMES A DAY 10/29/18   [provider]  losartan (COZAAR) 100 MG tablet Take 100 mg by mouth daily.     [provider]  magnesium citrate SOLN Take 1 Bottle by mouth once.    [provider]  metFORMIN (GLUCOPHAGE-XR) 500 MG 24 hr tablet Take 1,000 mg by mouth in the morning and at bedtime. PT TAKES 2000 MG DAILY 02/07/18   [provider]  mupirocin ointment (BACTROBAN) 2 % Apply 1 application topically 2 (two) times daily. 04/30/18   Trula Slade, DPM  naproxen (NAPROSYN) 250 MG tablet Take 1 tablet by mouth in the morning and at bedtime.    [provider]  nystatin cream (MYCOSTATIN) 1 APPLICATION TWICE A DAY EXTERNALLY 04/30/18   [provider]  ONETOUCH VERIO test strip  01/30/18   [provider]  pravastatin (PRAVACHOL) 40 MG tablet Take 1 tablet once a day for cholesterol    [provider]  SODIUM CHLORIDE, EXTERNAL, (SALINE WOUND [employer]) 0.9 % SOLN Cleanse left foot ulcer with saline once daily. 05/10/19   Marzetta Board, DPM  zinc sulfate 220 (50 Zn) MG capsule Take 220 mg by mouth daily.    [provider]    Allergies    Ciprofloxacin, Penicillins, Sulfa antibiotics, and Sulfamethoxazole  Review of Systems   Review of Systems  Ten systems reviewed and are negative for acute change, except as noted in the HPI.    Physical Exam Updated Vital Signs BP (!) 176/63   Pulse 84   Temp 98.2 F (36.8 C) (Oral)   Resp 18    Ht 5\' 9"  (1.753 m)   Wt 129.3 kg   SpO2 97%   BMI 42.09 kg/m   Physical Exam Vitals and nursing note reviewed.  Constitutional:      General: She is not in acute distress.    Appearance: She is well-developed. She is not diaphoretic.     Comments: Obese female. Patient in NAD.  HENT:     Head: Normocephalic. Laceration present. No raccoon eyes or Battle's sign.      Right Ear: External ear normal.     Left Ear: External ear normal.  Eyes:     General: No scleral icterus.    Extraocular Movements:  Extraocular movements intact.     Conjunctiva/sclera: Conjunctivae normal.     Pupils: Pupils are equal, round, and reactive to light.     Comments: No nystagmus  Neck:     Comments: C collar in place. No bony deformities, step offs, crepitus to the cervical midline. Pulmonary:     Effort: Pulmonary effort is normal. No respiratory distress.     Comments: Respirations even and unlabored Musculoskeletal:        General: Normal range of motion.  Skin:    General: Skin is warm and dry.     Coloration: Skin is not pale.     Findings: No erythema or rash.  Neurological:     General: No focal deficit present.     Mental Status: She is alert and oriented to person, place, and time.     Coordination: Coordination normal.     Comments: GCS 15. Speech is goal oriented. No cranial nerve deficits appreciated; symmetric eyebrow raise, no facial drooping, tongue midline. Patient has equal grip strength bilaterally with 5/5 strength against resistance in all major muscle groups bilaterally. Sensation to light touch intact. Patient moves extremities without ataxia.   Psychiatric:        Behavior: Behavior normal.     ED Results / Procedures / Treatments   Labs (all labs ordered are listed, but only abnormal results are displayed) Labs Reviewed - No data to display  EKG EKG Interpretation  Date/Time:  Monday May 13 2019 23:21:35 EST Ventricular Rate:  82 PR Interval:    QRS  Duration: 102 QT Interval:  435 QTC Calculation: 509 R Axis:   44 Text Interpretation: Sinus rhythm Prolonged QT interval No significant change since 03/24/2010 Confirmed by Veryl Speak [phone]) on 05/13/2019 11:37:36 PM   Radiology CT Head Wo Contrast  Result Date: 05/14/2019 CLINICAL DATA:  [address] from standing, posterior scalp laceration EXAM: CT HEAD WITHOUT CONTRAST TECHNIQUE: Contiguous axial images were obtained from the base of the skull through the vertex without intravenous contrast. COMPARISON:  None. FINDINGS: Brain: Hypodensity is seen within the left parieto-occipital periventricular white matter, with overlying cortical atrophy, most consistent with chronic ischemic change. Otherwise, no sign of acute infarct or hemorrhage. Lateral ventricles and midline structures are unremarkable. No acute extra-axial fluid collections. No mass effect. Vascular: No hyperdense vessel or unexpected calcification. Skull: Laceration is seen within the midline occipital scalp. No underlying bony abnormality. Sinuses/Orbits: No acute finding. Other: None IMPRESSION: 1. Age-indeterminate ischemic change within the left parietooccipital region, favor encephalomalacia from chronic infarct. 2. No acute intracranial process. 3. Small laceration within the midline occipital scalp. Electronically Signed   By: Randa Ngo M.D.   On: 05/14/2019 00:09   CT Cervical Spine Wo Contrast  Result Date: 05/14/2019 CLINICAL DATA:  [place] from standing, posterior scalp laceration EXAM: CT CERVICAL SPINE WITHOUT CONTRAST TECHNIQUE: Multidetector CT imaging of the cervical spine was performed without intravenous contrast. Multiplanar CT image reconstructions were also generated. COMPARISON:  None. FINDINGS: Alignment: Straightening of the cervical spine likely due to spondylosis and previous C5/C6 ACDF. Skull base and vertebrae: There are no acute displaced cervical spine fractures. Soft tissues and spinal canal: Thyroid is  homogeneous but enlarged, compatible with goiter. Otherwise soft tissues are unremarkable. Disc levels: Previous ACDF spans C5/C6. There is moderate spondylosis at C4/C5, without significant central canal or neural foraminal encroachment. Remaining levels are unremarkable. Upper chest: Airway is patent.  Lung apices are clear. Other: Reconstructed images demonstrate no additional findings. IMPRESSION: 1.  No acute cervical spine fracture. 2. C4/C5 spondylosis and previous C5/C6 ACDF. Electronically Signed   By: Randa Ngo M.D.   On: 05/14/2019 00:11    Procedures Procedures (including critical care time)  LACERATION REPAIR Performed by: Antonietta Breach Authorized by: Antonietta Breach Consent: Verbal consent obtained. Risks and benefits: risks, benefits and alternatives were discussed Consent given by: patient Patient identity confirmed: provided demographic data Prepped and Draped in normal sterile fashion Wound explored  Laceration Location: occipital scalp  Laceration Length: 5cm  No Foreign Bodies seen or palpated  Amount of cleaning: standard  Skin closure: staples  Number of staples: 3  Technique: simple  Patient tolerance: Patient tolerated the procedure well with no immediate complications.   Medications Ordered in ED Medications  acetaminophen (TYLENOL) tablet 1,000 mg (1,000 mg Oral Given 05/14/19 0001)  Tdap (BOOSTRIX) injection 0.5 mL (0.5 mLs Intramuscular Given 05/14/19 0001)    ED Course  I have reviewed the triage vital signs and the nursing notes.  Pertinent labs & imaging results that were available during my care of the patient were reviewed by me and considered in my medical decision making (see chart for details).  Clinical Course as of May 13 204  Tue May 14, 2019  0051 Ambulatory in hallway with walker with steady gait.   [KH]    Clinical Course User Index [KH] Beverely Pace   MDM Rules/Calculators/A&P                      69 year old female  presents to the emergency department following a mechanical fall at home.  She sustained a scalp laceration which was repaired with staples.  Tetanus updated.  She had no loss of consciousness or subsequent nausea or vomiting.  Neurologic exam nonfocal.  She did have CT of her head and neck performed, negative for acute intracranial or cervical process.  Patient presently ambulatory at baseline.  Tylenol given for residual headache.  Stable for discharge with instruction for primary care follow-up in 1 week for staple removal.  Return precautions discussed and provided. Patient discharged in stable condition with no unaddressed concerns.   Final Clinical Impression(s) / ED Diagnoses Final diagnoses:  Laceration of scalp, initial encounter  Fall at home, initial encounter    Rx / DC Orders ED Discharge Orders    None       Antonietta Breach, PA-C 05/14/19 0207    Veryl Speak, MD 05/14/19 [phone]

## 2019-05-13 NOTE — ED Notes (Signed)
Patient transported to CT 

## 2019-05-13 NOTE — ED Triage Notes (Signed)
Patient arrives by EMS [facility], fall from standing and hit posterior head with laceration 1 inch, bleeding controlled. A/Ox4, c/o of neck & head pain. BP: 160/72, HR 80, 97% RA, T: 98.5, CBG 202 per EMS.

## 2019-05-14 DIAGNOSIS — S0101XA Laceration without foreign body of scalp, initial encounter: Secondary | ICD-10-CM | POA: Diagnosis not present

## 2019-05-14 DIAGNOSIS — S199XXA Unspecified injury of neck, initial encounter: Secondary | ICD-10-CM | POA: Diagnosis not present

## 2019-05-14 NOTE — ED Notes (Signed)
Patient verbalized understanding of followup with PCP for staple removal. A/O X4, ambulated, tolerated liquids. Awaiting for family for ride.

## 2019-05-14 NOTE — ED Notes (Signed)
Pt ambulated to restroom with walker; steady gait noted.

## 2019-05-16 ENCOUNTER — Encounter: Payer: Self-pay | Admitting: Podiatry

## 2019-05-16 NOTE — Progress Notes (Signed)
Subjective: [name] presents today for follow up of at risk foot care. Patient has h/o digital amputation right hallux and left 3rd digit:   She states she lost her mother and is in the process of settling her estate. She has had to travel back and forth to [place] to take care of this. 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  Allergies  Allergen Reactions  . Ciprofloxacin Other (See Comments)    Unknown  . Penicillins Other (See Comments)    Unknown  . Sulfa Antibiotics   . Sulfamethoxazole Other (See Comments)    Unknown     Objective: Vitals:   05/10/19 1630  Temp: 99 F (37.2 C)   Vascular Examination:  Capillary fill time to digits <3s b/l, palpable DP pulses b/l, palpable PT pulses b/l, pedal hair absent b/l and skin temperature gradient within normal limits b/l  Dermatological Examination: Pedal skin with normal turgor, texture and tone bilaterally, no open wounds bilaterally, no interdigital macerations bilaterally, toenails 2-5 right, 1, 2, 4, 5 left  elongated, dystrophic, thickened, crumbly with subungual debris.  There is a hyperkeratotic lesion(s) submet head 1 left foot with central fissuring exposing a superficial ulceration with serous drainage, hyperkeratotic rim. No periwound erythema, no edema, no purulence, no odor. No erythema, no edema, no drainage, no flocculence. Postdebridement, measures 0.1 x 0.5 x 0.2 cm. No tracking, no tunneling, no undermining, no evidence of deep space abscess.  Musculoskeletal: Normal muscle strength 5/5 to all lower extremity muscle groups bilaterally, no pain crepitus or joint limitation noted with ROM b/l and digital amputation right hallux and left 3rd digit.  Neurological: Protective sensation absent with 10g monofilament b/l and vibratory sensation absent b/l  Assessment: 1. Onychomycosis   2. Diabetic ulcer of left foot associated with diabetes mellitus due to underlying  condition, limited to breakdown of skin, unspecified part of foot ([place])   3. Status post amputation of right great toe ([place])   4. Status post amputation of lesser toe of left foot ([place])   5. Diabetic peripheral neuropathy associated with type 2 diabetes mellitus ([place])     Plan: -Toenails 2-5 right, 1, 2, 4, 5 left were debrided in length and girth with sterile nail nippers and dremel without iatrogenic bleeding. -Ulcer was debrided of nonviable necrotic tissue and was resected to the level of subcutaneous tissue. Ulcer was cleansed with wound cleanser. Silvadene Cream was applied to base of wound with light dressing.  -Patient was given written instructions on once daily dressing Mupirocin Ointment dressing changes. She has Mupirocin Ointment at home.changes/aftercare and was instructed to call immediately if any signs or symptoms of infection arise. Rx written for saline for daily cleansing of area. -Patient instructed to report to emergency department with worsening appearance of ulcer/toe/foot, increased pain, foul odor, increased redness, swelling, drainage, fever, chills, nightsweats, nausea, vomiting, increased blood sugar.   -Callus debrided without complication or incident. Total number debrided submet head 1 right foot. -Patient to report any pedal injuries to medical professional immediately. -Patient/POA to call should there be question/concern in the interim.  Return in about 2 weeks (around 05/24/2019) for diabetic ulcer left great toe.

## 2019-05-17 DIAGNOSIS — I10 Essential (primary) hypertension: Secondary | ICD-10-CM | POA: Diagnosis not present

## 2019-05-20 ENCOUNTER — Telehealth: Payer: Self-pay | Admitting: Neurology

## 2019-05-20 DIAGNOSIS — E1140 Type 2 diabetes mellitus with diabetic neuropathy, unspecified: Secondary | ICD-10-CM | POA: Diagnosis not present

## 2019-05-20 DIAGNOSIS — R29818 Other symptoms and signs involving the nervous system: Secondary | ICD-10-CM | POA: Diagnosis not present

## 2019-05-20 DIAGNOSIS — S0101XD Laceration without foreign body of scalp, subsequent encounter: Secondary | ICD-10-CM | POA: Diagnosis not present

## 2019-05-20 DIAGNOSIS — Z7189 Other specified counseling: Secondary | ICD-10-CM | POA: Diagnosis not present

## 2019-05-20 DIAGNOSIS — W19XXXD Unspecified fall, subsequent encounter: Secondary | ICD-10-CM | POA: Diagnosis not present

## 2019-05-20 NOTE — Telephone Encounter (Signed)
Patient called regarding her falling again since her last visit with Dr. Delice Lesch. She said that her PCP advised her to let Dr. Delice Lesch know of the recent fall. She is scheduled to have an MRI on 06/05/19. Thanks

## 2019-05-27 ENCOUNTER — Encounter: Payer: Self-pay | Admitting: Podiatry

## 2019-05-27 ENCOUNTER — Other Ambulatory Visit: Payer: Self-pay

## 2019-05-27 ENCOUNTER — Ambulatory Visit: Payer: PPO | Admitting: Podiatry

## 2019-05-27 DIAGNOSIS — E08621 Diabetes mellitus due to underlying condition with foot ulcer: Secondary | ICD-10-CM

## 2019-05-27 DIAGNOSIS — L97521 Non-pressure chronic ulcer of other part of left foot limited to breakdown of skin: Secondary | ICD-10-CM

## 2019-05-27 NOTE — Patient Instructions (Signed)
Diabetes Mellitus and Foot Care Foot care is an important part of your health, especially when you have diabetes. Diabetes may cause you to have problems because of poor blood flow (circulation) to your feet and legs, which can cause your skin to:  Become thinner and drier.  Break more easily.  Heal more slowly.  Peel and crack. You may also have nerve damage (neuropathy) in your legs and feet, causing decreased feeling in them. This means that you may not notice minor injuries to your feet that could lead to more serious problems. Noticing and addressing any potential problems early is the best way to prevent future foot problems. How to care for your feet Foot hygiene  Wash your feet daily with warm water and mild soap. Do not use hot water. Then, pat your feet and the areas between your toes until they are completely dry. Do not soak your feet as this can dry your skin.  Trim your toenails straight across. Do not dig under them or around the cuticle. File the edges of your nails with an emery board or nail file.  Apply a moisturizing lotion or petroleum jelly to the skin on your feet and to dry, brittle toenails. Use lotion that does not contain alcohol and is unscented. Do not apply lotion between your toes. Shoes and socks  Wear clean socks or stockings every day. Make sure they are not too tight. Do not wear knee-high stockings since they may decrease blood flow to your legs.  Wear shoes that fit properly and have enough cushioning. Always look in your shoes before you put them on to be sure there are no objects inside.  To break in new shoes, wear them for just a few hours a day. This prevents injuries on your feet. Wounds, scrapes, corns, and calluses  Check your feet daily for blisters, cuts, bruises, sores, and redness. If you cannot see the bottom of your feet, use a mirror or ask someone for help.  Do not cut corns or calluses or try to remove them with medicine.  If you  find a minor scrape, cut, or break in the skin on your feet, keep it and the skin around it clean and dry. You may clean these areas with mild soap and water. Do not clean the area with peroxide, alcohol, or iodine.  If you have a wound, scrape, corn, or callus on your foot, look at it several times a day to make sure it is healing and not infected. Check for: ? Redness, swelling, or pain. ? Fluid or blood. ? Warmth. ? Pus or a bad smell. General instructions  Do not cross your legs. This may decrease blood flow to your feet.  Do not use heating pads or hot water bottles on your feet. They may burn your skin. If you have lost feeling in your feet or legs, you may not know this is happening until it is too late.  Protect your feet from hot and cold by wearing shoes, such as at the beach or on hot pavement.  Schedule a complete foot exam at least once a year (annually) or more often if you have foot problems. If you have foot problems, report any cuts, sores, or bruises to your health care provider immediately. Contact a health care provider if:  You have a medical condition that increases your risk of infection and you have any cuts, sores, or bruises on your feet.  You have an injury that is not   healing.  You have redness on your legs or feet.  You feel burning or tingling in your legs or feet.  You have pain or cramps in your legs and feet.  Your legs or feet are numb.  Your feet always feel cold.  You have pain around a toenail. Get help right away if:  You have a wound, scrape, corn, or callus on your foot and: ? You have pain, swelling, or redness that gets worse. ? You have fluid or blood coming from the wound, scrape, corn, or callus. ? Your wound, scrape, corn, or callus feels warm to the touch. ? You have pus or a bad smell coming from the wound, scrape, corn, or callus. ? You have a fever. ? You have a red line going up your leg. Summary  Check your feet every day  for cuts, sores, red spots, swelling, and blisters.  Moisturize feet and legs daily.  Wear shoes that fit properly and have enough cushioning.  If you have foot problems, report any cuts, sores, or bruises to your health care provider immediately.  Schedule a complete foot exam at least once a year (annually) or more often if you have foot problems. This information is not intended to replace advice given to you by your health care provider. Make sure you discuss any questions you have with your health care provider. Document Revised: 11/28/2018 Document Reviewed: 04/08/2016 Elsevier Patient Education  2020 [employer]  

## 2019-05-28 ENCOUNTER — Other Ambulatory Visit: Payer: Self-pay

## 2019-05-28 DIAGNOSIS — R519 Headache, unspecified: Secondary | ICD-10-CM

## 2019-05-28 DIAGNOSIS — R413 Other amnesia: Secondary | ICD-10-CM

## 2019-05-30 NOTE — Progress Notes (Signed)
Subjective:   [name] presents for continued care of ulceration submet head 1 left foot.  Patient has been performing daily dressing changes to left foot daily utilizing Mupirocin Ointment. She believes it is much improved on today's visit. Pt. denies any new complaints.  Patient denies any fever, chills, nightsweats, nausea or vomiting.  Current Outpatient Medications on File Prior to Visit  Medication Sig Dispense Refill  . amitriptyline (ELAVIL) 10 MG tablet Take 1 tablet (10 mg total) by mouth at bedtime. 20 tablet 0  . Ascorbic Acid (VITAMIN C) 100 MG tablet Take 100 mg by mouth daily.    Marland Kitchen buPROPion (WELLBUTRIN XL) 300 MG 24 hr tablet Take 300 mg by mouth daily.    . carvedilol (COREG) 6.25 MG tablet Take 1 tablet (6.25 mg total) by mouth 2 (two) times daily. 180 tablet 1  . Cholecalciferol (VITAMIN D3) 1.25 MG (50000 UT) TABS Take by mouth.    Marland Kitchen FLUoxetine (PROZAC) 40 MG capsule Take 40 mg by mouth daily.   0  . furosemide (LASIX) 40 MG tablet Take 1 tablet (40 mg total) by mouth daily. 90 tablet 3  . insulin NPH-regular Human (NOVOLIN 70/30) (70-30) 100 UNIT/ML injection Inject into the skin. 24 ml in the morning and 20 ml in the evening    . Inulin (FIBER CHOICE PO) Take by mouth.    . Lancets (ONETOUCH DELICA PLUS 123XX123) MISC USE AS DIRECTED 3 TIMES A DAY    . losartan (COZAAR) 100 MG tablet Take 100 mg by mouth daily.     . magnesium citrate SOLN Take 1 Bottle by mouth once.    . metFORMIN (GLUCOPHAGE-XR) 500 MG 24 hr tablet Take 1,000 mg by mouth in the morning and at bedtime. PT TAKES 2000 MG DAILY    . mupirocin ointment (BACTROBAN) 2 % Apply 1 application topically 2 (two) times daily. 30 g 2  . naproxen (NAPROSYN) 250 MG tablet Take 1 tablet by mouth in the morning and at bedtime.    Marland Kitchen nystatin cream (MYCOSTATIN) 1 APPLICATION TWICE A DAY EXTERNALLY    . ONETOUCH VERIO test strip     . pravastatin (PRAVACHOL) 40 MG tablet Take 1 tablet once a day for cholesterol     . SODIUM CHLORIDE, EXTERNAL, (SALINE WOUND [place]) 0.9 % SOLN Cleanse left foot ulcer with saline once daily. 210 mL 0  . zinc sulfate 220 (50 Zn) MG capsule Take 220 mg by mouth daily.     No current facility-administered medications on file prior to visit.     Allergies  Allergen Reactions  . Ciprofloxacin Other (See Comments)    Unknown  . Penicillins Other (See Comments)    Unknown  . Sulfa Antibiotics   . Sulfamethoxazole Other (See Comments)    Unknown    Objective:   Vascular Examination: There were no vitals filed for this visit.   Capillary fill time to digits <3 seconds b/l. Palpable DP pulses b/l. Palpable PT pulses b/l. Pedal hair absent b/l Skin temperature gradient within normal limits b/l.  Pedal skin with normal turgor, texture and tone bilaterally. No interdigital macerations bilaterally. Ulceration located submet head 1 left foot. Predebridement measurements carried out today of 0.1 x 0.5 cm.  No periulcerative erythema, no edema, no drainage.  No flocculence, no malodor. Roof of ulcer is hyperkeratotic with subdermal hemorrhage. Postdebridement measurements are 0.1 x 0.5 cm. Postdebridement, there is no undermining, no tunneling, no visible joint or bone exposure, no probing  to bone, no odor. Base of ulcer is noted to be dermal/partial thickness. No underlying flocculence. Nearly healed.  Normal muscle strength 5/5 to all lower extremity muscle groups bilaterally, no pain crepitus or joint limitation noted with ROM b/l, bunion deformity noted b/l and digital amputation right hallux and left 3rd digit.  Protective sensation diminished with 10g monofilament b/l.  Assessment:   1.   Diabetic Ulceration submet head 1 left foot, nearly healed 2.   NIDDM with peripheral neuropathy  Plan: 1. Ulcer was debrided to the level of healthy, viable tissue. Ulcer was cleansed with wound cleanser. TAO applied to base of wound with light dressing. 2. She is to continue applying  Mupirocin Ointment to area once daily. 3. Patient was given written instructions on dressing changes and was instructed to call immediately if any signs or symptoms of infection arise.  4. Patient instructed to report to emergency department with worsening appearance of ulcer/toe/foot, increased pain, foul odor, increased redness, swelling, drainage, fever, chills, nightsweats, nausea, vomiting, increased blood sugar.  5. I would like her to follow up in 2-2.5 weeks before she heads back to [place].  6. Patient/POA related understanding.  Return in about 2 weeks (around 06/10/2019).

## 2019-06-03 ENCOUNTER — Encounter: Payer: Self-pay | Admitting: Physical Therapy

## 2019-06-03 ENCOUNTER — Other Ambulatory Visit: Payer: Self-pay

## 2019-06-03 ENCOUNTER — Ambulatory Visit: Payer: PPO | Attending: Family Medicine | Admitting: Physical Therapy

## 2019-06-03 DIAGNOSIS — R2681 Unsteadiness on feet: Secondary | ICD-10-CM | POA: Insufficient documentation

## 2019-06-03 DIAGNOSIS — R2689 Other abnormalities of gait and mobility: Secondary | ICD-10-CM | POA: Insufficient documentation

## 2019-06-03 DIAGNOSIS — M6281 Muscle weakness (generalized): Secondary | ICD-10-CM | POA: Diagnosis not present

## 2019-06-03 NOTE — Therapy (Signed)
[facility] [address] [place], [place], [address] Phone: [phone]   Fax:  [phone]  Physical Therapy Evaluation  Patient Details  Name: [name] MRN: [id number] Date of Birth: 04/24/1950 Referring Provider (PT): Dr. [name]   Encounter Date: 06/03/2019  PT End of Session - 06/03/19 2052    Visit Number  1    Number of Visits  9    Date for PT Re-Evaluation  07/05/19    Authorization Type  [employer]    Authorization Time Period  06-03-19 - 09-01-19    PT Start Time  1104    PT Stop Time  1151    PT Time Calculation (min)  47 min    Activity Tolerance  Patient tolerated treatment well    Behavior During Therapy  [facility] for tasks assessed/performed       Past Medical History:  Diagnosis Date  . Chronic kidney disease   . Depression   . Diabetes mellitus without complication ([place])   . Dyslipidemia   . Endometrial adenocarcinoma ([place])   . Esophageal reflux   . Fever blister   . Hyperlipidemia   . Hypertension   . OSA (obstructive sleep apnea)   . Peripheral neuropathy   . Retinopathy   . Severe obesity ([place])     Past Surgical History:  Procedure Laterality Date  . ADRENALECTOMY    . BUNIONECTOMY    . CARDIAC CATHETERIZATION    . CERVICAL SPINE SURGERY    . CHOLECYSTECTOMY    . LAPAROSCOPIC SALPINGOOPHERECTOMY    . THORACOTOMY    . TONSILLECTOMY      There were no vitals filed for this visit.   Subjective Assessment - 06/03/19 1110    Subjective  Pt presents for PT eval for balance disorder with pt reporting 2 recent falls, on 02-10-19 & 05-13-19:  pt states she had to have 3 staples in her head - was taken to [facility] ED; CAT scan was negative; first fall was in [place], [place] (while she was visiting out of town); was taken to ED in [place] and CAT scan was negative; pt is amb. with unsteady gait pattern with use of SPC    Pertinent History  DM with neuropathy; chronic kidney  disease: HTN:  depression:  obesity    Limitations  Walking;House hold activities    Patient Stated Goals  learn some hints on keeping balance and prevent falls; learn HEP to do at home for balance    Currently in Pain?  No/denies         [facility] PT Assessment - 06/03/19 1113      Assessment   Medical Diagnosis  Balance Disorder:  Frequent falls:  Peripheral Neuropathy     Referring Provider (PT)  Dr. [name]    Onset Date/Surgical Date  02/10/19    Prior Therapy  none      Precautions   Precautions  Fall      Restrictions   Weight Bearing Restrictions  No      Balance Screen   Has the patient fallen in the past 6 months  Yes    How many times?  2    Has the patient had a decrease in activity level because of a fear of falling?   Yes    Is the patient reluctant to leave their home because of a fear of falling?   No      Home Environment   Living Environment  Private residence    Type of [place] to enter    Entrance Stairs-Number of Steps  4    Entrance Stairs-Rails  Cannot reach both;Right    [place]  One level    [place] - 2 wheels;Cane - single point      Prior Function   Level of Independence  Independent with basic ADLs;Independent with household mobility with device;Independent with community mobility with device;Independent with transfers      ROM / Strength   AROM / PROM / Strength  Strength      Strength   Overall Strength  Deficits    Overall Strength Comments  WFL's except for bil. plantarflexors       Transfers   Transfers  Sit to Stand    Sit to Stand  5: Supervision    Five time sit to stand comments   20.83 without UE support    Number of Reps  Other reps (comment)   5   Comments  from high/low mat table      Ambulation/Gait   Ambulation/Gait  Yes    Ambulation/Gait Assistance  5: Supervision    Ambulation Distance (Feet)  75 Feet    Assistive device  Straight cane    Gait Pattern  Step-through  pattern    Ambulation Surface  Level;Indoor    Gait velocity  16.13 secs = 2.03 ft/sec      Standardized Balance Assessment   Standardized Balance Assessment  Berg Balance Test;Timed Up and Go Test      Berg Balance Test   Sit to Stand  Able to stand without using hands and stabilize independently    Standing Unsupported  Able to stand 2 minutes with supervision    Sitting with Back Unsupported but Feet Supported on Floor or Stool  Able to sit safely and securely 2 minutes    Stand to Sit  Sits safely with minimal use of hands    Transfers  Able to transfer safely, definite need of hands    Standing Unsupported with Eyes Closed  Able to stand 10 seconds with supervision    Standing Unsupported with Feet Together  Able to place feet together independently and stand for 1 minute with supervision    From Standing, Reach Forward with Outstretched Arm  Can reach confidently >25 cm (10")    From Standing Position, Pick up Object from Floor  Unable to try/needs assist to keep balance    From Standing Position, Turn to Look Behind Over each Shoulder  Turn sideways only but maintains balance    Turn 360 Degrees  Able to turn 360 degrees safely but slowly    Standing Unsupported, Alternately Place Feet on Step/Stool  Able to complete >2 steps/needs minimal assist    Standing Unsupported, One Foot in Front  Able to plae foot ahead of the other independently and hold 30 seconds    Standing on One Leg  Tries to lift leg/unable to hold 3 seconds but remains standing independently    Total Score  37      Timed Up and Go Test   TUG  Normal TUG    Normal TUG (seconds)  34.34                Objective measurements completed on examination: See above findings.              PT Education - 06/03/19 2050  Education Details  eval results; verbally instructed pt to begin sit to stand without UE support for HEP and tap ups alternating feet with UE support on hand rails at home for HEP     Person(s) Educated  Patient    Methods  Explanation    Comprehension  Verbalized understanding;Returned demonstration          PT Long Term Goals - 06/03/19 2102      PT LONG TERM GOAL #1   Title  Pt will be independent in HEP for balance & strengthening exercises.    Time  4    Period  Weeks    Status  New    Target Date  07/05/19      PT LONG TERM GOAL #2   Title  Pt will improve Berg score from 37/56 to >/= 41/56.    Baseline  37/56    Time  4    Period  Weeks    Status  New    Target Date  07/05/19      PT LONG TERM GOAL #3   Title  Demo improved LE strength so that 5 times sit to stand improves from 20.83 secs to </= 15 secs without UE support.    Baseline  20.83 secs    Time  4    Period  Weeks    Status  New    Target Date  07/05/19      PT LONG TERM GOAL #4   Title  Improve TUG score from 34.34 secs to </= 26 secs with use of SPC to demo improved balance and reduced fall risk.    Baseline  34.34 secs    Time  4    Period  Weeks    Status  New    Target Date  07/05/19             Plan - 06/03/19 2054    Clinical Impression Statement  Pt is a 69 yr old lady with peripheral neuropathy due to DM contributing to decreased balance and gait disorder; pt states she has had 2 falls within past 6 months (02-10-19 & 05-13-19);  pt is using a SPC but is amb. with unsteady gait pattern - a RW has been recommended as pt is at high fall risk per Merrilee Jansky balance score of 37/56 and TUG score of 34.34 secs.  Pt will benefit from skilled PT to address balance deficits, gait abnormality and LE weakness.    Personal Factors and Comorbidities  Fitness;Past/Current Experience;Comorbidity 2;Time since onset of injury/illness/exacerbation    Comorbidities  DM with peripheral neuropathy, chronic kidney disease, HTN, obesity, depression    Examination-Activity Limitations  Stand;Locomotion Level;Bend;Squat;Stairs;Transfers    Examination-Participation Restrictions  Yard Work;Meal  Prep;Cleaning;Community Activity;Shop;Laundry    Stability/Clinical Decision Making  Evolving/Moderate complexity    Clinical Decision Making  Moderate    Rehab Potential  Good    PT Frequency  2x / week    PT Duration  4 weeks    PT Treatment/Interventions  ADLs/Self Care Home Management;Balance training;Neuromuscular re-education;Aquatic Therapy;Patient/family education;Therapeutic exercise;Therapeutic activities;Gait training;Stair training;DME Instruction    PT Next Visit Plan  please begin balance HEP ( give pic of sit to stand without UE support) - needs kicks, marching, sidestepping, partial tandem and SLS: tap ups ; heel raises for strengthening   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  see above    Recommended Other Services  aquatic therapy in April?    Consulted and Agree with Plan of Care  Patient       Patient will benefit from skilled therapeutic intervention in order to improve the following deficits and impairments:  Abnormal gait, Decreased activity tolerance, Decreased balance, Decreased strength, Impaired sensation, Obesity  Visit Diagnosis: Other abnormalities of gait and mobility - Plan: PT plan of care cert/re-cert  Muscle weakness (generalized) - Plan: PT plan of care cert/re-cert  Unsteadiness on feet - Plan: PT plan of care cert/re-cert     Problem List Patient Active Problem List   Diagnosis Date Noted  . Unilateral primary osteoarthritis, right knee 04/16/2019  . Morbid (severe) obesity due to excess calories ([place]) 04/16/2019  . Mixed hyperlipidemia 07/19/2017  . Palpitations 07/19/2017  . Tachycardia 07/19/2017  . Skin ulcer of left foot with fat layer exposed ([place]) 11/02/2016  . CKD (chronic kidney disease) stage 2, GFR 60-89 ml/min 06/07/2016  . Type 2 diabetes mellitus with hyperglycemia, with long-term current use of insulin ([place]) 06/07/2016  . Diabetic polyneuropathy ([place]) 06/07/2016  . Mixed  dyslipidemia 06/07/2016  . Hypertension, essential 06/07/2016  . Type 2 diabetes mellitus with both eyes affected by mild nonproliferative retinopathy without macular edema, with long-term current use of insulin ([place]) 06/07/2016  . Obstructive sleep apnea 06/07/2016    Alda Lea, PT 06/03/2019, 9:12 PM  [facility] [address] [place], [place], [address] Phone: [phone]   Fax:  [phone]  Name: JESSICALYNN KINGCADE MRN: [id number] Date of Birth: 14-May-1950

## 2019-06-04 ENCOUNTER — Ambulatory Visit: Payer: PPO | Admitting: Physical Therapy

## 2019-06-05 ENCOUNTER — Ambulatory Visit
Admission: RE | Admit: 2019-06-05 | Discharge: 2019-06-05 | Disposition: A | Discharge status: Home / Self Care | Payer: PPO | Source: Ambulatory Visit | Attending: Neurology | Admitting: Neurology

## 2019-06-05 DIAGNOSIS — R413 Other amnesia: Secondary | ICD-10-CM | POA: Diagnosis not present

## 2019-06-05 DIAGNOSIS — R519 Headache, unspecified: Secondary | ICD-10-CM | POA: Diagnosis not present

## 2019-06-06 DIAGNOSIS — F4321 Adjustment disorder with depressed mood: Secondary | ICD-10-CM | POA: Diagnosis not present

## 2019-06-06 DIAGNOSIS — M47812 Spondylosis without myelopathy or radiculopathy, cervical region: Secondary | ICD-10-CM | POA: Diagnosis not present

## 2019-06-06 DIAGNOSIS — I10 Essential (primary) hypertension: Secondary | ICD-10-CM | POA: Diagnosis not present

## 2019-06-06 DIAGNOSIS — E1142 Type 2 diabetes mellitus with diabetic polyneuropathy: Secondary | ICD-10-CM | POA: Diagnosis not present

## 2019-06-06 DIAGNOSIS — F339 Major depressive disorder, recurrent, unspecified: Secondary | ICD-10-CM | POA: Diagnosis not present

## 2019-06-06 DIAGNOSIS — N183 Chronic kidney disease, stage 3 unspecified: Secondary | ICD-10-CM | POA: Diagnosis not present

## 2019-06-06 DIAGNOSIS — I129 Hypertensive chronic kidney disease with stage 1 through stage 4 chronic kidney disease, or unspecified chronic kidney disease: Secondary | ICD-10-CM | POA: Diagnosis not present

## 2019-06-06 DIAGNOSIS — E1165 Type 2 diabetes mellitus with hyperglycemia: Secondary | ICD-10-CM | POA: Diagnosis not present

## 2019-06-06 DIAGNOSIS — E1121 Type 2 diabetes mellitus with diabetic nephropathy: Secondary | ICD-10-CM | POA: Diagnosis not present

## 2019-06-06 DIAGNOSIS — E11319 Type 2 diabetes mellitus with unspecified diabetic retinopathy without macular edema: Secondary | ICD-10-CM | POA: Diagnosis not present

## 2019-06-06 DIAGNOSIS — E785 Hyperlipidemia, unspecified: Secondary | ICD-10-CM | POA: Diagnosis not present

## 2019-06-06 DIAGNOSIS — I2510 Atherosclerotic heart disease of native coronary artery without angina pectoris: Secondary | ICD-10-CM | POA: Diagnosis not present

## 2019-06-07 ENCOUNTER — Ambulatory Visit: Payer: PPO | Admitting: Physical Therapy

## 2019-06-10 ENCOUNTER — Telehealth: Payer: Self-pay | Admitting: Neurology

## 2019-06-10 NOTE — Telephone Encounter (Signed)
Spoke to patient re: MRI brain, no acute changes. It confirmed the left parietal chronic infarct seen on prior head CT, additional tiny chronic cortical/subcortical infarcts are in both frontal lobes. Continue daily aspirin. We discussed her recent fall, likely due to neuropathy, she is already doing balance therapy. Proceed with neurocognitive testing as scheduled.

## 2019-06-12 ENCOUNTER — Other Ambulatory Visit: Payer: Self-pay

## 2019-06-12 ENCOUNTER — Ambulatory Visit: Payer: PPO | Admitting: Podiatry

## 2019-06-12 ENCOUNTER — Encounter: Payer: Self-pay | Admitting: Podiatry

## 2019-06-12 DIAGNOSIS — E1142 Type 2 diabetes mellitus with diabetic polyneuropathy: Secondary | ICD-10-CM

## 2019-06-12 DIAGNOSIS — E08621 Diabetes mellitus due to underlying condition with foot ulcer: Secondary | ICD-10-CM

## 2019-06-12 DIAGNOSIS — L97521 Non-pressure chronic ulcer of other part of left foot limited to breakdown of skin: Secondary | ICD-10-CM | POA: Diagnosis not present

## 2019-06-12 NOTE — Patient Instructions (Signed)
Diabetes Mellitus and Foot Care Foot care is an important part of your health, especially when you have diabetes. Diabetes may cause you to have problems because of poor blood flow (circulation) to your feet and legs, which can cause your skin to:  Become thinner and drier.  Break more easily.  Heal more slowly.  Peel and crack. You may also have nerve damage (neuropathy) in your legs and feet, causing decreased feeling in them. This means that you may not notice minor injuries to your feet that could lead to more serious problems. Noticing and addressing any potential problems early is the best way to prevent future foot problems. How to care for your feet Foot hygiene  Wash your feet daily with warm water and mild soap. Do not use hot water. Then, pat your feet and the areas between your toes until they are completely dry. Do not soak your feet as this can dry your skin.  Trim your toenails straight across. Do not dig under them or around the cuticle. File the edges of your nails with an emery board or nail file.  Apply a moisturizing lotion or petroleum jelly to the skin on your feet and to dry, brittle toenails. Use lotion that does not contain alcohol and is unscented. Do not apply lotion between your toes. Shoes and socks  Wear clean socks or stockings every day. Make sure they are not too tight. Do not wear knee-high stockings since they may decrease blood flow to your legs.  Wear shoes that fit properly and have enough cushioning. Always look in your shoes before you put them on to be sure there are no objects inside.  To break in new shoes, wear them for just a few hours a day. This prevents injuries on your feet. Wounds, scrapes, corns, and calluses  Check your feet daily for blisters, cuts, bruises, sores, and redness. If you cannot see the bottom of your feet, use a mirror or ask someone for help.  Do not cut corns or calluses or try to remove them with medicine.  If you  find a minor scrape, cut, or break in the skin on your feet, keep it and the skin around it clean and dry. You may clean these areas with mild soap and water. Do not clean the area with peroxide, alcohol, or iodine.  If you have a wound, scrape, corn, or callus on your foot, look at it several times a day to make sure it is healing and not infected. Check for: ? Redness, swelling, or pain. ? Fluid or blood. ? Warmth. ? Pus or a bad smell. General instructions  Do not cross your legs. This may decrease blood flow to your feet.  Do not use heating pads or hot water bottles on your feet. They may burn your skin. If you have lost feeling in your feet or legs, you may not know this is happening until it is too late.  Protect your feet from hot and cold by wearing shoes, such as at the beach or on hot pavement.  Schedule a complete foot exam at least once a year (annually) or more often if you have foot problems. If you have foot problems, report any cuts, sores, or bruises to your health care provider immediately. Contact a health care provider if:  You have a medical condition that increases your risk of infection and you have any cuts, sores, or bruises on your feet.  You have an injury that is not   healing.  You have redness on your legs or feet.  You feel burning or tingling in your legs or feet.  You have pain or cramps in your legs and feet.  Your legs or feet are numb.  Your feet always feel cold.  You have pain around a toenail. Get help right away if:  You have a wound, scrape, corn, or callus on your foot and: ? You have pain, swelling, or redness that gets worse. ? You have fluid or blood coming from the wound, scrape, corn, or callus. ? Your wound, scrape, corn, or callus feels warm to the touch. ? You have pus or a bad smell coming from the wound, scrape, corn, or callus. ? You have a fever. ? You have a red line going up your leg. Summary  Check your feet every day  for cuts, sores, red spots, swelling, and blisters.  Moisturize feet and legs daily.  Wear shoes that fit properly and have enough cushioning.  If you have foot problems, report any cuts, sores, or bruises to your health care provider immediately.  Schedule a complete foot exam at least once a year (annually) or more often if you have foot problems. This information is not intended to replace advice given to you by your health care provider. Make sure you discuss any questions you have with your health care provider. Document Revised: 11/28/2018 Document Reviewed: 04/08/2016 Elsevier Patient Education  2020 [employer]  

## 2019-06-14 ENCOUNTER — Other Ambulatory Visit: Payer: Self-pay

## 2019-06-14 ENCOUNTER — Encounter: Payer: Self-pay | Admitting: Physical Therapy

## 2019-06-14 ENCOUNTER — Ambulatory Visit: Payer: PPO | Admitting: Physical Therapy

## 2019-06-14 DIAGNOSIS — R2689 Other abnormalities of gait and mobility: Secondary | ICD-10-CM | POA: Diagnosis not present

## 2019-06-14 DIAGNOSIS — M6281 Muscle weakness (generalized): Secondary | ICD-10-CM

## 2019-06-14 DIAGNOSIS — R2681 Unsteadiness on feet: Secondary | ICD-10-CM

## 2019-06-14 NOTE — Therapy (Signed)
[facility] [address] [place], [place], [address] Phone: [phone]   Fax:  [phone]  Physical Therapy Treatment  Patient Details  Name: [name] MRN: [id number] Date of Birth: [birth year] Referring Provider (PT): Dr. [name]   Encounter Date: 06/14/2019  PT End of Session - 06/14/19 0853    Visit Number  2    Number of Visits  9    Date for PT Re-Evaluation  07/05/19    Authorization Type  [employer]    Authorization Time Period  06-03-19 - 09-01-19    Progress Note Due on Visit  10    PT Start Time  0848    PT Stop Time  0930    PT Time Calculation (min)  42 min    Activity Tolerance  Patient tolerated treatment well;No increased pain    Behavior During Therapy  WFL for tasks assessed/performed       Past Medical History:  Diagnosis Date  . Chronic kidney disease   . Depression   . Diabetes mellitus without complication ([place])   . Dyslipidemia   . Endometrial adenocarcinoma ([place])   . Esophageal reflux   . Fever blister   . Hyperlipidemia   . Hypertension   . OSA (obstructive sleep apnea)   . Peripheral neuropathy   . Retinopathy   . Severe obesity ([place])     Past Surgical History:  Procedure Laterality Date  . ADRENALECTOMY    . BUNIONECTOMY    . CARDIAC CATHETERIZATION    . CERVICAL SPINE SURGERY    . CHOLECYSTECTOMY    . LAPAROSCOPIC SALPINGOOPHERECTOMY    . THORACOTOMY    . TONSILLECTOMY      There were no vitals filed for this visit.  Subjective Assessment - 06/14/19 0852    Subjective  No new complaints. No falls or pain to report. Has been trying to work on the ex (sit<>stand) given to her at eval.    Pertinent History  DM with neuropathy; chronic kidney disease: HTN:  depression:  obesity    Limitations  Walking;House hold activities    Patient Stated Goals  learn some hints on keeping balance and prevent falls; learn HEP to do at home for balance     Currently in Pain?  No/denies           [facility] Adult PT Treatment/Exercise - 06/14/19 1027      Transfers   Transfers  Sit to Stand;Stand to Sit    Sit to Stand  4: Min guard;With upper extremity assist;Without upper extremity assist;From bed;From chair/3-in-1    Stand to Sit  5: Supervision;With upper extremity assist;To bed;To chair/3-in-1;Without upper extremity assist      Ambulation/Gait   Ambulation/Gait  Yes    Ambulation/Gait Assistance  5: Supervision    Ambulation/Gait Assistance Details  around gym with session    Assistive device  Straight cane    Gait Pattern  Step-through pattern;Decreased stride length    Ambulation Surface  Level;Indoor      Exercises   Exercises  Other Exercises    Other Exercises   issued ex's to HEP for strengthening and balance. Cues needed on form and technique. Min guard assist with balance ex's. Refer to [place] program for full details.           Access Code: [email or web address] URL: [email or web address] Date: 06/14/2019 Prepared by: Willow Ora  Exercises Sit to Stand without Arm Support - 1 x daily - 5 x  weekly - 1 sets - 10 reps Standing Hip Abduction with Counter Support - 1 x daily - 5 x weekly - 1 sets - 10 reps Standing Hip Extension with Counter Support - 1 x daily - 5 x weekly - 1 sets - 10 reps Heel Toe Raises with Counter Support - 1 x daily - 5 x weekly - 1 sets - 10 reps - 5 hold Walking [place] - 1 x daily - 5 x weekly - 1 sets - 10 reps Standing Tandem Balance with Counter Support - 1 x daily - 5 x weekly - 1 sets - 2 reps - 20 hold Standing Single Leg Stance with Counter Support - 1 x daily - 5 x weekly - 1 sets - 2 reps - 10 hold      PT Education - 06/14/19 1029    Education Details  initial HEP for strengthening and balance    Person(s) Educated  Patient    Methods  Explanation;Demonstration;Verbal cues;Handout    Comprehension  Verbalized understanding;Returned demonstration;Verbal cues required;Need  further instruction          PT Long Term Goals - 06/03/19 2102      PT LONG TERM GOAL #1   Title  Pt will be independent in HEP for balance & strengthening exercises.    Time  4    Period  Weeks    Status  New    Target Date  07/05/19      PT LONG TERM GOAL #2   Title  Pt will improve Berg score from 37/56 to >/= 41/56.    Baseline  37/56    Time  4    Period  Weeks    Status  New    Target Date  07/05/19      PT LONG TERM GOAL #3   Title  Demo improved LE strength so that 5 times sit to stand improves from 20.83 secs to </= 15 secs without UE support.    Baseline  20.83 secs    Time  4    Period  Weeks    Status  New    Target Date  07/05/19      PT LONG TERM GOAL #4   Title  Improve TUG score from 34.34 secs to </= 26 secs with use of SPC to demo improved balance and reduced fall risk.    Baseline  34.34 secs    Time  4    Period  Weeks    Status  New    Target Date  07/05/19            Plan - 06/14/19 0854    Clinical Impression Statement  Today's skilled session focused on establishment of an HEP to address LE strengthening and balance. No issues reported with performance in session. The pt is progressing toward goals and should benefit from continued PT to progress toward unmet goals.    Personal Factors and Comorbidities  Fitness;Past/Current Experience;Comorbidity 2;Time since onset of injury/illness/exacerbation    Comorbidities  DM with peripheral neuropathy, chronic kidney disease, HTN, obesity, depression    Examination-Activity Limitations  Stand;Locomotion Level;Bend;Squat;Stairs;Transfers    Examination-Participation Restrictions  Yard Work;Meal Prep;Cleaning;Community Activity;Shop;Laundry    Stability/Clinical Decision Making  Evolving/Moderate complexity    Rehab Potential  Good    PT Frequency  2x / week    PT Duration  4 weeks    PT Treatment/Interventions  ADLs/Self Care Home Management;Balance training;Neuromuscular re-education;Aquatic  Therapy;Patient/family education;Therapeutic exercise;Therapeutic activities;Gait training;Stair training;DME  Instruction    PT Next Visit Plan  continued to work on strengthening and balance.  (Pt is going out of town/state until mid-late April and will call at that time to schedule).   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 Unilateral primary osteoarthritis, right knee 04/16/2019  . Morbid (severe) obesity due to excess calories ([place]) 04/16/2019  . Mixed hyperlipidemia 07/19/2017  . Palpitations 07/19/2017  . Tachycardia 07/19/2017  . Skin ulcer of left foot with fat layer exposed ([place]) 11/02/2016  . CKD (chronic kidney disease) stage 2, GFR 60-89 ml/min 06/07/2016  . Type 2 diabetes mellitus with hyperglycemia, with long-term current use of insulin ([place]) 06/07/2016  . Diabetic polyneuropathy ([place]) 06/07/2016  . Mixed dyslipidemia 06/07/2016  . Hypertension, essential 06/07/2016  . Type 2 diabetes mellitus with both eyes affected by mild nonproliferative retinopathy without macular edema, with long-term current use of insulin ([place]) 06/07/2016  . Obstructive sleep apnea 06/07/2016    Willow Ora, PTA, [facility] [address], [place], [place] [address] [phone] 06/14/19, 10:29 AM   Name: [name] MRN: [id number] Date of Birth: May 22, 1950

## 2019-06-14 NOTE — Patient Instructions (Signed)
Access Code: [email or web address] URL: [email or web address] Date: 06/14/2019 Prepared by: Willow Ora  Exercises Sit to Stand without Arm Support - 1 x daily - 5 x weekly - 1 sets - 10 reps Standing Hip Abduction with Counter Support - 1 x daily - 5 x weekly - 1 sets - 10 reps Standing Hip Extension with Counter Support - 1 x daily - 5 x weekly - 1 sets - 10 reps Heel Toe Raises with Counter Support - 1 x daily - 5 x weekly - 1 sets - 10 reps - 5 hold Walking March - 1 x daily - 5 x weekly - 1 sets - 10 reps Standing Tandem Balance with Counter Support - 1 x daily - 5 x weekly - 1 sets - 2 reps - 20 hold Standing Single Leg Stance with Counter Support - 1 x daily - 5 x weekly - 1 sets - 2 reps - 10 hold

## 2019-06-17 NOTE — Progress Notes (Signed)
Subjective:   [name] presents for continued care of ulceration of left foot.  Patient has been performing daily dressing changes to left foot daily utilizing mupirocin ointment. Pt. denies any new complaints.  Patient denies any fever, chills, nightsweats, nausea or vomiting.  She will be traveling back to [place] soon  to handle some family affairs.  Current Outpatient Medications on File Prior to Visit  Medication Sig Dispense Refill  . amitriptyline (ELAVIL) 10 MG tablet Take 1 tablet (10 mg total) by mouth at bedtime. 20 tablet 0  . Ascorbic Acid (VITAMIN C) 100 MG tablet Take 100 mg by mouth daily.    Marland Kitchen buPROPion (WELLBUTRIN XL) 300 MG 24 hr tablet Take 300 mg by mouth daily.    . carvedilol (COREG) 6.25 MG tablet Take 1 tablet (6.25 mg total) by mouth 2 (two) times daily. 180 tablet 1  . Cholecalciferol (VITAMIN D3) 1.25 MG (50000 UT) TABS Take by mouth.    Marland Kitchen FLUoxetine (PROZAC) 40 MG capsule Take 40 mg by mouth daily.   0  . furosemide (LASIX) 40 MG tablet Take 1 tablet (40 mg total) by mouth daily. 90 tablet 3  . insulin NPH-regular Human (NOVOLIN 70/30) (70-30) 100 UNIT/ML injection Inject into the skin. 24 ml in the morning and 20 ml in the evening    . Inulin (FIBER CHOICE PO) Take by mouth.    . Lancets (ONETOUCH DELICA PLUS 123XX123) MISC USE AS DIRECTED 3 TIMES A DAY    . losartan (COZAAR) 100 MG tablet Take 100 mg by mouth daily.     . magnesium citrate SOLN Take 1 Bottle by mouth once.    . metFORMIN (GLUCOPHAGE-XR) 500 MG 24 hr tablet Take 1,000 mg by mouth in the morning and at bedtime. PT TAKES 2000 MG DAILY    . mupirocin ointment (BACTROBAN) 2 % Apply 1 application topically 2 (two) times daily. 30 g 2  . naproxen (NAPROSYN) 250 MG tablet Take 1 tablet by mouth in the morning and at bedtime.    Marland Kitchen nystatin cream (MYCOSTATIN) 1 APPLICATION TWICE A DAY EXTERNALLY    . ONETOUCH VERIO test strip     . pravastatin (PRAVACHOL) 40 MG tablet Take 1 tablet once a day for  cholesterol    . SODIUM CHLORIDE, EXTERNAL, (SALINE WOUND [place]) 0.9 % SOLN Cleanse left foot ulcer with saline once daily. 210 mL 0  . zinc sulfate 220 (50 Zn) MG capsule Take 220 mg by mouth daily.     No current facility-administered medications on file prior to visit.     Allergies  Allergen Reactions  . Ciprofloxacin Other (See Comments)    Unknown  . Penicillins Other (See Comments)    Unknown  . Sulfa Antibiotics   . Sulfamethoxazole Other (See Comments)    Unknown     Objective:   There were no vitals filed for this visit.   Vascular Examination: Capillary fill time to digits <3 seconds b/l. Palpable DP pulses b/l. Palpable PT pulses b/l. Pedal hair absent b/l Skin temperature gradient within normal limits b/l.  Dermatological Examination: Pedal skin with normal turgor, texture and tone bilaterally. No open wounds bilaterally. No interdigital macerations bilaterally. Ulceration located submet head 1 left foot with subdermal hemorrhage. Predebridement measurements carried out today of 0.3 x 0.2 cm.  No periulcerative erythema, no edema, no drainage.  No flocculence, no malodor. Roof of ulcer is hyperkeratotic. Postdebridement measurements are 0.3 x 0.2 cm. Postdebridement, there is no  undermining, no tunneling, no visible joint or bone exposure, no probing to bone, no odor. Base of ulcer is noted to be completely epithelialized.  Musculoskeletal Examination: Normal muscle strength 5/5 to all lower extremity muscle groups bilaterally, no pain crepitus or joint limitation noted with ROM b/l, HAV with bunion left foot and digital amputation right hallux and left 3rd digit.  Neurological Examination: Protective sensation diminished with 10g monofilament b/l.  Assessment:   1.    Diabetic Ulceration submet head 1 left foot healed 2.    NIDDM with peripheral neuropathy  Plan: 1. Ulcer was debrided to the level of healthy, viable tissue. Ulcer was cleansed with wound cleanser.  Triple antibiotic ointment and band-aid applied. 2. Resume normal daily activity with soft, supportive shoe gear. 3. Patient is to follow up 1 month. 4. Patient instructed to report to emergency department with worsening appearance of ulcer/toe/foot, increased pain, foul odor, increased redness, swelling, drainage, fever, chills, nightsweats, nausea, vomiting, increased blood sugar.  5. Patient/POA related understanding. 6. She was advised to seek local Podiatrist should she have any foot issues while she is out of state. She related understanding.  Return in about 30 days (around 07/12/2019).

## 2019-06-18 DIAGNOSIS — I129 Hypertensive chronic kidney disease with stage 1 through stage 4 chronic kidney disease, or unspecified chronic kidney disease: Secondary | ICD-10-CM | POA: Diagnosis not present

## 2019-06-25 DIAGNOSIS — E11319 Type 2 diabetes mellitus with unspecified diabetic retinopathy without macular edema: Secondary | ICD-10-CM | POA: Diagnosis not present

## 2019-06-25 DIAGNOSIS — E1142 Type 2 diabetes mellitus with diabetic polyneuropathy: Secondary | ICD-10-CM | POA: Diagnosis not present

## 2019-06-25 DIAGNOSIS — I2510 Atherosclerotic heart disease of native coronary artery without angina pectoris: Secondary | ICD-10-CM | POA: Diagnosis not present

## 2019-06-25 DIAGNOSIS — E785 Hyperlipidemia, unspecified: Secondary | ICD-10-CM | POA: Diagnosis not present

## 2019-06-25 DIAGNOSIS — F4321 Adjustment disorder with depressed mood: Secondary | ICD-10-CM | POA: Diagnosis not present

## 2019-06-25 DIAGNOSIS — E1140 Type 2 diabetes mellitus with diabetic neuropathy, unspecified: Secondary | ICD-10-CM | POA: Diagnosis not present

## 2019-06-25 DIAGNOSIS — N183 Chronic kidney disease, stage 3 unspecified: Secondary | ICD-10-CM | POA: Diagnosis not present

## 2019-06-25 DIAGNOSIS — I129 Hypertensive chronic kidney disease with stage 1 through stage 4 chronic kidney disease, or unspecified chronic kidney disease: Secondary | ICD-10-CM | POA: Diagnosis not present

## 2019-06-25 DIAGNOSIS — E1165 Type 2 diabetes mellitus with hyperglycemia: Secondary | ICD-10-CM | POA: Diagnosis not present

## 2019-06-25 DIAGNOSIS — F339 Major depressive disorder, recurrent, unspecified: Secondary | ICD-10-CM | POA: Diagnosis not present

## 2019-06-25 DIAGNOSIS — M47812 Spondylosis without myelopathy or radiculopathy, cervical region: Secondary | ICD-10-CM | POA: Diagnosis not present

## 2019-06-25 DIAGNOSIS — I10 Essential (primary) hypertension: Secondary | ICD-10-CM | POA: Diagnosis not present

## 2019-07-08 ENCOUNTER — Ambulatory Visit: Payer: PPO | Admitting: Podiatry

## 2019-07-08 ENCOUNTER — Encounter: Payer: Self-pay | Admitting: Podiatry

## 2019-07-08 ENCOUNTER — Other Ambulatory Visit: Payer: Self-pay

## 2019-07-08 VITALS — Temp 97.9°F

## 2019-07-08 DIAGNOSIS — E08621 Diabetes mellitus due to underlying condition with foot ulcer: Secondary | ICD-10-CM

## 2019-07-08 DIAGNOSIS — Z89411 Acquired absence of right great toe: Secondary | ICD-10-CM

## 2019-07-08 DIAGNOSIS — L97521 Non-pressure chronic ulcer of other part of left foot limited to breakdown of skin: Secondary | ICD-10-CM | POA: Diagnosis not present

## 2019-07-08 DIAGNOSIS — E1142 Type 2 diabetes mellitus with diabetic polyneuropathy: Secondary | ICD-10-CM

## 2019-07-08 NOTE — Progress Notes (Signed)
Subjective:   [name] presents today for walk-in emergent appointment concerning her left foot. She has h/o ulceration submet head 1 of this limb. She states she had been in [place] cleaning out her childhood home and has been on her feet quite a bit. She noticed callused area starting to break down. She has noticed a light drainage with blood tinge to it. She denies any fever, chills, night sweats, nausea or vomiting. States her sister is a retired Marine scientist and she cleaned it with peroxide. [name] has been keeping it clean.   Current Outpatient Medications on File Prior to Visit  Medication Sig Dispense Refill  . amitriptyline (ELAVIL) 10 MG tablet Take 1 tablet (10 mg total) by mouth at bedtime. 20 tablet 0  . Ascorbic Acid (VITAMIN C) 100 MG tablet Take 100 mg by mouth daily.    Marland Kitchen buPROPion (WELLBUTRIN XL) 300 MG 24 hr tablet Take 300 mg by mouth daily.    . carvedilol (COREG) 6.25 MG tablet Take 1 tablet (6.25 mg total) by mouth 2 (two) times daily. 180 tablet 1  . Cholecalciferol (VITAMIN D3) 1.25 MG (50000 UT) TABS Take by mouth.    Marland Kitchen FLUoxetine (PROZAC) 40 MG capsule Take 40 mg by mouth daily.   0  . furosemide (LASIX) 40 MG tablet Take 1 tablet (40 mg total) by mouth daily. 90 tablet 3  . insulin NPH-regular Human (NOVOLIN 70/30) (70-30) 100 UNIT/ML injection Inject into the skin. 24 ml in the morning and 20 ml in the evening    . Inulin (FIBER CHOICE PO) Take by mouth.    . Lancets (ONETOUCH DELICA PLUS 123XX123) MISC USE AS DIRECTED 3 TIMES A DAY    . losartan (COZAAR) 100 MG tablet Take 100 mg by mouth daily.     . magnesium citrate SOLN Take 1 Bottle by mouth once.    . metFORMIN (GLUCOPHAGE-XR) 500 MG 24 hr tablet Take 1,000 mg by mouth in the morning and at bedtime. PT TAKES 2000 MG DAILY    . MONOJECT INSULIN SYRINGE 31G X 5/16" 1 ML MISC See admin instructions.    . mupirocin ointment (BACTROBAN) 2 % Apply 1 application topically 2 (two) times daily. 30 g 2  . naproxen  (NAPROSYN) 250 MG tablet Take 1 tablet by mouth in the morning and at bedtime.    Marland Kitchen nystatin cream (MYCOSTATIN) 1 APPLICATION TWICE A DAY EXTERNALLY    . ONETOUCH VERIO test strip     . pravastatin (PRAVACHOL) 40 MG tablet Take 1 tablet once a day for cholesterol    . SODIUM CHLORIDE, EXTERNAL, (SALINE WOUND [place]) 0.9 % SOLN Cleanse left foot ulcer with saline once daily. 210 mL 0  . zinc sulfate 220 (50 Zn) MG capsule Take 220 mg by mouth daily.     No current facility-administered medications on file prior to visit.     Allergies  Allergen Reactions  . Ciprofloxacin Other (See Comments)    Unknown  . Penicillins Other (See Comments)    Unknown  . Sulfa Antibiotics   . Sulfamethoxazole Other (See Comments)    Unknown     Objective:   [name] is a pleasant 69 y.o. Caucasian female, morbidly obese, in NAD.  AAO x 3.  Vitals:   07/08/19 1042  Temp: 97.9 F (36.6 C)    Vascular Examination: Capillary fill time to digits <3 seconds b/l. Palpable DP pulses b/l. Palpable PT pulses b/l. Pedal hair absent b/l Skin  temperature gradient within normal limits b/l.  Dermatological Examination:   Pedal skin with normal turgor, texture and tone bilaterally. No interdigital macerations bilaterally. Toenails recently debrided b/l.Marland Kitchen Ulceration located submet head 1 left foot. Predebridement measurements carried out today of 1.0 x 1.0 cm..  No periulcerative erythema, no edema, no drainage.  No flocculence, no malodor. Roof of ulcer is hyperkeratotic. Postdebridement measurements are 1.0 x 0.8 x 0.2 cm. Postdebridement, there is no undermining, no tunneling, no visible joint or bone exposure, no probing to bone, no odor. Base of ulcer is noted to be granular. No deep abscess evident.  Musculoskeletal Examination: Normal muscle strength 5/5 to all lower extremity muscle groups bilaterally, no pain crepitus or joint limitation noted with ROM b/l, bunion deformity noted b/l and digital amputation L  3rd toe and R hallux.  Neurological Examination: Protective sensation diminished with 10g monofilament b/l. Vibratory sensation absent b/l.  Assessment:   1.  Diabetic Ulceration submet head 1 left foot (recurrent) 2.    NIDDM with peripheral neuropathy  Plan: 1. Ulcer was debrided to the level of healthy, viable subcutaneous tissue. Ulcer was cleansed with wound cleanser. Povidine Ointment was applied to base of wound with light dressing. 2. Surgical shoe with Peg Assist was dispensed for left foot. 3. Patient was given written instructions on daily Medihoney dressing changes and was instructed to call immediately if any signs or symptoms of infection arise.  4. No xray taken today as no clinical suspicion of osteomyelitis or infection. No culture taken today.  5. Patient is to follow up 1 week with Dr. Jacqualyn Posey. 6. I did advise her to reconsider diabetic shoes and informed her we have different styles for her to choose from. She will also need customized offloaded insoles for this area of pressure she continues to have on her left foot. She will think about it. 7. Patient instructed to report to emergency department with worsening appearance of ulcer/toe/foot, increased pain, foul odor, increased redness, swelling, drainage, fever, chills, nightsweats, nausea, vomiting, increased blood sugar.  8. Patient/POA related understanding.  Return in about 1 week (around 07/15/2019) for see Dr. Jacqualyn Posey in one week for diabetic ulcer submet head 1 left foot.

## 2019-07-08 NOTE — Patient Instructions (Addendum)
DRESSING CHANGES LEFT FOOT:  WEAR SURGICAL SHOE AT ALL TIMES    1. KEEP LEFT  FOOT DRY AT ALL TIMES!!!!  2. CLEANSE ULCER WITH SALINE.  3. DAB DRY WITH GAUZE SPONGE.  4. APPLY A LIGHT AMOUNT OF MEDIHONEY TO BASE OF ULCER.  5. APPLY OUTER DRESSING/BAND-AID AS INSTRUCTED.  6. WEAR SURGICAL SHOE DAILY AT ALL TIMES.  7. DO NOT WALK BAREFOOT!!!  8.  IF YOU EXPERIENCE ANY FEVER, CHILLS, NIGHTSWEATS, NAUSEA OR VOMITING, ELEVATED OR LOW BLOOD SUGARS, REPORT TO EMERGENCY ROOM.  9. IF YOU EXPERIENCE INCREASED REDNESS, PAIN, SWELLING, DISCOLORATION, ODOR, PUS, DRAINAGE OR WARMTH OF YOUR FOOT, REPORT TO EMERGENCY ROOM.  Diabetes Mellitus and Foot Care Foot care is an important part of your health, especially when you have diabetes. Diabetes may cause you to have problems because of poor blood flow (circulation) to your feet and legs, which can cause your skin to:  Become thinner and drier.  Break more easily.  Heal more slowly.  Peel and crack. You may also have nerve damage (neuropathy) in your legs and feet, causing decreased feeling in them. This means that you may not notice minor injuries to your feet that could lead to more serious problems. Noticing and addressing any potential problems early is the best way to prevent future foot problems. How to care for your feet Foot hygiene  Wash your feet daily with warm water and mild soap. Do not use hot water. Then, pat your feet and the areas between your toes until they are completely dry. Do not soak your feet as this can dry your skin.  Trim your toenails straight across. Do not dig under them or around the cuticle. File the edges of your nails with an emery board or nail file.  Apply a moisturizing lotion or petroleum jelly to the skin on your feet and to dry, brittle toenails. Use lotion that does not contain alcohol and is unscented. Do not apply lotion between your toes. Shoes and socks  Wear clean socks or stockings every day.  Make sure they are not too tight. Do not wear knee-high stockings since they may decrease blood flow to your legs.  Wear shoes that fit properly and have enough cushioning. Always look in your shoes before you put them on to be sure there are no objects inside.  To break in new shoes, wear them for just a few hours a day. This prevents injuries on your feet. Wounds, scrapes, corns, and calluses  Check your feet daily for blisters, cuts, bruises, sores, and redness. If you cannot see the bottom of your feet, use a mirror or ask someone for help.  Do not cut corns or calluses or try to remove them with medicine.  If you find a minor scrape, cut, or break in the skin on your feet, keep it and the skin around it clean and dry. You may clean these areas with mild soap and water. Do not clean the area with peroxide, alcohol, or iodine.  If you have a wound, scrape, corn, or callus on your foot, look at it several times a day to make sure it is healing and not infected. Check for: ? Redness, swelling, or pain. ? Fluid or blood. ? Warmth. ? Pus or a bad smell. General instructions  Do not cross your legs. This may decrease blood flow to your feet.  Do not use heating pads or hot water bottles on your feet. They may burn your skin. If   you have lost feeling in your feet or legs, you may not know this is happening until it is too late.  Protect your feet from hot and cold by wearing shoes, such as at the beach or on hot pavement.  Schedule a complete foot exam at least once a year (annually) or more often if you have foot problems. If you have foot problems, report any cuts, sores, or bruises to your health care provider immediately. Contact a health care provider if:  You have a medical condition that increases your risk of infection and you have any cuts, sores, or bruises on your feet.  You have an injury that is not healing.  You have redness on your legs or feet.  You feel burning or  tingling in your legs or feet.  You have pain or cramps in your legs and feet.  Your legs or feet are numb.  Your feet always feel cold.  You have pain around a toenail. Get help right away if:  You have a wound, scrape, corn, or callus on your foot and: ? You have pain, swelling, or redness that gets worse. ? You have fluid or blood coming from the wound, scrape, corn, or callus. ? Your wound, scrape, corn, or callus feels warm to the touch. ? You have pus or a bad smell coming from the wound, scrape, corn, or callus. ? You have a fever. ? You have a red line going up your leg. Summary  Check your feet every day for cuts, sores, red spots, swelling, and blisters.  Moisturize feet and legs daily.  Wear shoes that fit properly and have enough cushioning.  If you have foot problems, report any cuts, sores, or bruises to your health care provider immediately.  Schedule a complete foot exam at least once a year (annually) or more often if you have foot problems. This information is not intended to replace advice given to you by your health care provider. Make sure you discuss any questions you have with your health care provider. Document Revised: 11/28/2018 Document Reviewed: 04/08/2016 Elsevier Patient Education  2020 [employer]  

## 2019-07-15 ENCOUNTER — Ambulatory Visit (INDEPENDENT_AMBULATORY_CARE_PROVIDER_SITE_OTHER): Payer: PPO | Admitting: Psychology

## 2019-07-15 ENCOUNTER — Other Ambulatory Visit: Payer: Self-pay

## 2019-07-15 ENCOUNTER — Encounter: Payer: Self-pay | Admitting: Psychology

## 2019-07-15 ENCOUNTER — Ambulatory Visit: Payer: PPO | Admitting: Psychology

## 2019-07-15 DIAGNOSIS — I639 Cerebral infarction, unspecified: Secondary | ICD-10-CM | POA: Insufficient documentation

## 2019-07-15 DIAGNOSIS — F330 Major depressive disorder, recurrent, mild: Secondary | ICD-10-CM

## 2019-07-15 DIAGNOSIS — G4733 Obstructive sleep apnea (adult) (pediatric): Secondary | ICD-10-CM | POA: Diagnosis not present

## 2019-07-15 DIAGNOSIS — F329 Major depressive disorder, single episode, unspecified: Secondary | ICD-10-CM | POA: Insufficient documentation

## 2019-07-15 DIAGNOSIS — R4189 Other symptoms and signs involving cognitive functions and awareness: Secondary | ICD-10-CM

## 2019-07-15 NOTE — Progress Notes (Signed)
   Psychometrician Note   Cognitive testing was administered to [name] by [name], B.S. (psychometrist) under the supervision of Dr. Christia Reading, Ph.D., licensed psychologist on @DATE @. [name] did not appear overtly distressed by the testing session per behavioral observation or responses across self-report questionnaires. Dr. Christia Reading, Ph.D. checked in with [name] as needed to manage any distress related to testing procedures (if applicable). Rest breaks were offered.    The battery of tests administered was selected by Dr. Christia Reading, Ph.D. with consideration to [name] current level of functioning, the nature of her symptoms, emotional and behavioral responses during interview, level of literacy, observed level of motivation/effort, and the nature of the referral question. This battery was communicated to the psychometrist. Communication between Dr. Christia Reading, Ph.D. and the psychometrist was ongoing throughout the evaluation and Dr. Christia Reading, Ph.D. was immediately accessible at all times. Dr. Christia Reading, Ph.D. provided supervision to the psychometrist on the date of this service to the extent necessary to assure the quality of all services provided.    [name] will return within approximately 1-2 weeks for an interactive feedback session with [name] at which time her test performances, clinical impressions, and treatment recommendations will be reviewed in detail. [name] understands she can contact our office should she require our assistance before this time.  A total of 150 minutes of billable time were spent face-to-face with [name] by the psychometrist. This includes both test administration and scoring time. Billing for these services is reflected in the clinical report generated by Dr. Christia Reading, Ph.D..  This note reflects time spent with the psychometrician and does not include test scores or any clinical  interpretations made by [name]. The full report will follow in a separate note.

## 2019-07-15 NOTE — Progress Notes (Signed)
NEUROPSYCHOLOGICAL EVALUATION [name]. [place] [facility]  Date of Evaluation: July 15, 2019  Reason for Referral:   [name] is a 69 y.o. right-handed Caucasian female referred by Ellouise Newer, M.D., to characterize her current cognitive functioning and assist with diagnostic clarity and treatment planning in the context of subjective cognitive decline, several cardiovascular comorbidities, and a history of several small cerebral infarcts in the left parietal and bilateral frontal lobes.   Assessment and Plan:   Clinical Impression(s): [name]' pattern of performance is suggestive of neuropsychological functioning within normal limits. An isolated weakness was exhibited across an unstructured card sorting test assessing pattern recognition and adaptability; however, performance across all other executive functioning assessments was appropriate. Variability (below average to above average normative ranges) was also seen across processing speed and visuospatial functioning. Performance was consistently strong across additional domains of attention/concentration, receptive and expressive language, and learning and memory. [name] largely denied difficulties completing instrumental activities of daily living (ADLs) independently.  [name]' medical history is consistent for several small seemingly asymptomatic infarcts in the left parietal and bilateral frontal lobes. Performance variability across visuospatial abilities could certainly be related to lesions in the parietal lobe, while trouble with processing speed and executive functioning could certainly related to lesions in the frontal lobes. Based upon current testing, cognitive deficits appear subtle to mild at best. Also important to note, [name] reported a history of obstructive sleep apnea, moderate ongoing sleep dysfunction, and has not been using her CPAP machine. She also reported  moderate levels of anxiety and mild levels of depression on related questionnaires. Given minimal deficits on testing, it is possible that a majority of performance weaknesses are actually due to ongoing sleep and psychiatric factors more so than her stroke history. Sleep dysfunction and psychiatric distress would negatively impact her day-to-day functioning, as well as several cognitive domains including processing speed, attention/concentration, executive functioning, and learning and memory.   Specific to memory, [name] was able to learn novel verbal and visual information efficiently and retain this knowledge after lengthy delays. Overall, memory performance combined with intact performances across other areas of cognitive functioning is not suggestive of Alzheimer's disease. Likewise, her cognitive and behavioral profile is not suggestive of any other form of neurodegenerative illness presently.  Recommendations: [name] reported being diagnosed with obstructive sleep apnea but does not use her CPAP machine, making this condition appear largely untreated. Poorly managed sleep apnea will directly impact the efficiency at which she can function on a day-to-day basis. It will also increase her risk for heart attack, additional strokes, or the development of dementia in the future. As such, she is encouraged to utilize her CPAP machine nightly, while also touching base with her medical team about additional ways to treat this condition.  A combination of medication and psychotherapy has been shown to be most effective at treating symptoms of anxiety and depression. As such, [name] is encouraged to speak with her prescribing physician regarding medication adjustments to optimally manage these symptoms.  Likewise, [name] is encouraged to consider engaging in short-term psychotherapy to address symptoms of psychiatric distress. She would benefit from an active and collaborative therapeutic  environment, rather than one purely supportive in nature. Recommended treatment modalities include Cognitive Behavioral Therapy (CBT) or Acceptance and Commitment Therapy (ACT).  Should subjective cognitive decline persist following appropriate sleep apnea treatment and optimized mood symptoms, a repeat neuropsychological evaluation would be recommended at that time.  The current evaluation will serve as an excellent baseline to compare future any evaluations against.  [name] is encouraged to attend to lifestyle factors for brain health (e.g., regular physical exercise, good nutrition habits, regular participation in cognitively-stimulating activities, and general stress management techniques), which are likely to have benefits for both emotional adjustment and cognition. In fact, in addition to promoting good general health, regular exercise incorporating aerobic activities (e.g., brisk walking, jogging, cycling, etc.) has been demonstrated to be a very effective treatment for depression and stress, with similar efficacy rates to both antidepressant medication and psychotherapy. Optimal control of vascular risk factors (including safe cardiovascular exercise and adherence to dietary recommendations) is encouraged. Likewise, continued compliance with her CPAP machine will also be important.  If interested, there are some activities which have therapeutic value and can be useful in keeping her cognitively stimulated. For suggestions, [name] is encouraged to go to the following website: https://www.[facility].org/get-to-know-barrow/centers-programs/neurorehabilitation-center/neuro-rehab-apps-and-games/ which has options, categorized by level of difficulty. It should be noted that these activities should not be viewed as a substitute for therapy.  To address problems with fluctuating processing speed and executive dysfunction, she may wish to consider:   -Avoiding external distractions when needing to  concentrate   -Limiting exposure to fast paced environments with multiple sensory demands   -Writing down complicated information and using checklists   -Attempting and completing one task at a time (i.e., no multi-tasking)   -Verbalizing aloud each step of a task to maintain focus   -Taking frequent breaks during the completion of steps/tasks to avoid fatigue   -Reducing the amount of information considered at one time   -Scheduling more difficult activities for a time of day where she is usually most alert  Review of Records:   [name] was seen by [facility] KitchenEllouise Newer, M.D.) on 05/03/2019 for an evaluation of memory loss. Overall, [name] reported experiencing memory changes after she retired in 2018. She further noticed a big change over the past year in how much effort it requires to think of things. She reported misplacing things frequently and other memory lapses (e.g., reaching for the refrigerator to get ice). Her family has noticed that she does not always spell things correctly when she had always been good with spelling. Her handwriting has also gotten worse. She enjoys puzzles but has not been able to do them much anymore. Regarding ADLs, she stopped driving after a fall in November where she injured her knees. Her medications come in pillpacks but she still forgets 1-2 times a month. She denies missing bill payments (most are on autopay).   Of note, she was helping her sister clean out her mother's house in November 2020, was standing on a stoop, felt herself going backward after her sister passed her a bag, and experienced a fall that was more than 90 degrees down. She hit her head hard but denied a loss of consciousness. She went to [facility] in [place] where she had a head CT reporting a hypoattenuating focus within the left posterior parietal/occipital cortex, favored to be related to encephalomalacia. A few weeks after the fall, she started having occasional headaches  that became more constant in January, occurring 1-2 times a week. Pain was said to be in the right temporal region, in her jaw, and over the right eye. She reported neck pain after the fall which still feels uncomfortable. She also has chronic back pain. She has neuropathy and loses her balance easily. There is numbness  up to the mid-calf region and her hands are starting to be affected along the fingertips. She has also noticed more tremors in her hands. Performance on a brief cognitive screening instrument (SLUMS) was 27/30. Ultimately, [name] was referred for a comprehensive neuropsychological evaluation to characterize her cognitive abilities and to assist with diagnostic clarity and treatment planning.   Head CT on 05/14/2019 revealed an age-indeterminate ischemic change within the left parieto-occipital region, favoring encephalomalacia from a chronic infarct. No intracranial processes were identified. Brain MRI on 06/05/2019 revealed a small chronic infarct in the left parietal lobe with a small amount of hemosiderin staining. Additional tiny chronic cortical/subcortical infarcts were noted in both frontal lobes. T2 hyperintensities were elsewhere seen in the cerebral white matter bilaterally and in the pons. These were nonspecific but compatible with mildly age advanced chronic small vessel ischemic disease. Mild generalized cerebral atrophy was also noted.  Past Medical History:  Diagnosis Date  . CKD (chronic kidney disease) 06/07/2016   Stage 2, GFR 60-89 ml/min  . Diabetic polyneuropathy 06/07/2016  . Dyslipidemia   . Endometrial adenocarcinoma   . Esophageal reflux   . Fever blister   . Hyperlipidemia   . Hypertension, essential 06/07/2016  . Major depressive disorder   . Mixed dyslipidemia 06/07/2016  . Obstructive sleep apnea 06/07/2016  . Peripheral neuropathy   . Retinopathy   . Severe obesity   . Skin ulcer of left foot with fat layer exposed 11/02/2016  . Stroke    Asymptomatic,  discovered via neuroimaging; small infarct in left parietal lobe; also concern for small b/l frontal infarcts  . Tachycardia 07/19/2017  . Type 2 diabetes mellitus with hyperglycemia, with long-term current use of insulin 06/07/2016  . Unilateral primary osteoarthritis, right knee 04/16/2019    Past Surgical History:  Procedure Laterality Date  . ADRENALECTOMY    . BUNIONECTOMY    . CARDIAC CATHETERIZATION    . CERVICAL SPINE SURGERY    . CHOLECYSTECTOMY    . LAPAROSCOPIC SALPINGOOPHERECTOMY    . THORACOTOMY    . TONSILLECTOMY      Current Outpatient Medications:  .  amitriptyline (ELAVIL) 10 MG tablet, Take 1 tablet (10 mg total) by mouth at bedtime., Disp: 20 tablet, Rfl: 0 .  Ascorbic Acid (VITAMIN C) 100 MG tablet, Take 100 mg by mouth daily., Disp: , Rfl:  .  buPROPion (WELLBUTRIN XL) 300 MG 24 hr tablet, Take 300 mg by mouth daily., Disp: , Rfl:  .  carvedilol (COREG) 6.25 MG tablet, Take 1 tablet (6.25 mg total) by mouth 2 (two) times daily., Disp: 180 tablet, Rfl: 1 .  Cholecalciferol (VITAMIN D3) 1.25 MG (50000 UT) TABS, Take by mouth., Disp: , Rfl:  .  FLUoxetine (PROZAC) 40 MG capsule, Take 40 mg by mouth daily. , Disp: , Rfl: 0 .  furosemide (LASIX) 40 MG tablet, Take 1 tablet (40 mg total) by mouth daily., Disp: 90 tablet, Rfl: 3 .  insulin NPH-regular Human (NOVOLIN 70/30) (70-30) 100 UNIT/ML injection, Inject into the skin. 24 ml in the morning and 20 ml in the evening, Disp: , Rfl:  .  Inulin (FIBER CHOICE PO), Take by mouth., Disp: , Rfl:  .  Lancets (ONETOUCH DELICA PLUS 123XX123) MISC, USE AS DIRECTED 3 TIMES A DAY, Disp: , Rfl:  .  losartan (COZAAR) 100 MG tablet, Take 100 mg by mouth daily. , Disp: , Rfl:  .  magnesium citrate SOLN, Take 1 Bottle by mouth once., Disp: , Rfl:  .  metFORMIN (GLUCOPHAGE-XR) 500 MG 24 hr tablet, Take 1,000 mg by mouth in the morning and at bedtime. PT TAKES 2000 MG DAILY, Disp: , Rfl:  .  MONOJECT INSULIN SYRINGE 31G X 5/16" 1 ML MISC, See  admin instructions., Disp: , Rfl:  .  mupirocin ointment (BACTROBAN) 2 %, Apply 1 application topically 2 (two) times daily., Disp: 30 g, Rfl: 2 .  naproxen (NAPROSYN) 250 MG tablet, Take 1 tablet by mouth in the morning and at bedtime., Disp: , Rfl:  .  nystatin cream (MYCOSTATIN), 1 APPLICATION TWICE A DAY EXTERNALLY, Disp: , Rfl:  .  ONETOUCH VERIO test strip, , Disp: , Rfl:  .  pravastatin (PRAVACHOL) 40 MG tablet, Take 1 tablet once a day for cholesterol, Disp: , Rfl:  .  SODIUM CHLORIDE, EXTERNAL, (SALINE WOUND Traver) 0.9 % SOLN, Cleanse left foot ulcer with saline once daily., Disp: 210 mL, Rfl: 0 .  zinc sulfate 220 (50 Zn) MG capsule, Take 220 mg by mouth daily., Disp: , Rfl:   Clinical Interview:   Cognitive Symptoms: Decreased short-term memory: Endorsed. She reported being unable to finish her sentences due to word-finding difficulties, misplacing things around the home, and trouble remembering details of previous conversations. The latter was described as longstanding in nature where she has always focused on "giving the cliff notes version" rather than every detail. However, memory difficulties were said to have gradually worsened following her retirement in 2018.  Decreased long-term memory: Denied. Decreased attention/concentration: Endorsed. While she did not endorse trouble maintaining her focus/concentration, she did acknowledge increased distractibility, as well as some trouble with losing her train of thought. However, the latter was more attributed to word finding difficulties.  Reduced processing speed: Denied. Difficulties with executive functions: Endorsed. She reported trouble with complex planning, organization, and mild impulsive behaviors at times while shopping. Trouble with indecisiveness or using good judgment was denied. Overt personality changes were also denied.  Difficulties with emotion regulation: Denied. Difficulties with receptive language:  Denied. Difficulties with word finding: Endorsed (see above). She also reported that her sister has commented that she seems to speak more quietly now.  Decreased visuoperceptual ability: Unclear. She denied difficulties with depth perception. While she did acknowledge often tripping over things in her environment, it was unclear if this was better attributed to her balance concerns and history of neuropathy.   Difficulties completing ADLs: Largely denied. She acknowledged rare instances where she will forget to take her medications. These are largely when her routine has been disrupted. She denied trouble remembering to pay bills or any difficulties driving.  Additional Medical History: History of traumatic brain injury/concussion: Endorsed. She reported a history of two falls which led to direct head impacts. Both were falling backwards: the first involved her being given something to hold which impacted her weight, pulling her backwards; the other occurred after she was looking up and lost her balance. CT scans were said to be negative in these instances and persisting post-concussion symptoms were denied.  History of stroke: Endorsed. Per medical records, neuroimaging has suggested prior small infarcts in the left parietal and bilateral frontal lobes. These were said to be asymptomatic and [name] was unclear as to when they may have occurred.  History of seizure activity: Denied. History of known exposure to toxins: Denied. Symptoms of chronic pain: Endorsed. She reported significant symptoms of peripheral neuropathy in her feet. She also reported ongoing knee, back, and shoulder pain due her weight and a variety of health complications.  She underwent back surgery in the past, noting that these symptoms are still bothersome today.  Experience of frequent headaches/migraines: Endorsed. Since her fall in 2018, she reported increased headache symptoms. She reported experiencing a dull pressure ache,  generally over her eyes in the front of her head. She reported utilizing OTC medications with positive effect.  Frequent instances of dizziness/vertigo: Endorsed. These were said to occur after sitting/standing up. She noted that she needs to sit for a few minutes in order to adjust her equilibrium before standing up.   Sensory changes: She has cataracts and is awaiting surgery, causing some visual acuity difficulties, especially surrounding small print. She alluded to the possibility of subtlety diminished hearing. Other sensory changes/difficulties (e.g., taste or smell) were denied.  Balance/coordination difficulties: Endorsed. Ongoing balance concerns were described as her most significant symptom and largely attributed to her history of peripheral neuropathy (i.e., "no feeling in my feet"). She also reported ongoing problems with her right knee, as well as the presence of an ulcer on her left foot, making balance and ambulation challenging. As mentioned above, she reported a history of several falls. She currently uses a cane when walking.  Other motor difficulties: Endorsed. She reported upper extremity tremors. She was unclear if these were related to anxiety, neuropathy, or some other unknown reason.   Sleep History: Estimated hours obtained each night: Unknown. She reported generally sleeping in "90-minute cycles" where she will wake up and experience some difficulties falling back asleep.  Difficulties falling asleep: Endorsed. She reported occasional symptoms of insomnia generally relating to have an overactive mind during attempts to fall asleep.  Difficulties staying asleep: Endorsed. She reported waking up often to use the restroom. She also reported awakening for other unknown (possible sleep apnea related) reasons.  Feels rested and refreshed upon awakening: Denied.  History of snoring: Endorsed. History of waking up gasping for air: Endorsed. Witnessed breath cessation while asleep:  Endorsed. She acknowledged a history of obstructive sleep apnea. While prescribed a CPAP machine in the past, she denied current use due to feelings of discomfort while wearing the mask. As such, this condition appears untreated currently.   History of vivid dreaming: Denied. Excessive movement while asleep: Denied. Instances of acting out her dreams: Denied.  Psychiatric/Behavioral Health History: Depression: Endorsed. 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 She is reportedly currently taking three anti-depressant medications (although one was said to be prescribed as a sleep-aid). These were said to be beneficial in addressing these symptoms. She described her current mood as "all right at the moment." She denied current or remote suicidal ideation, intent, or plan.  Anxiety: Denied. However, following one of her recent falls, she described experiencing a scalp laceration on the back of her head, leading to symptoms consistent with a panic attack. No history of other potential panic attacks was reported.  Mania: Denied. Trauma History: Denied. Visual/auditory hallucinations: Denied. Delusional thoughts: Denied.  Tobacco: Denied. Alcohol: She denied current alcohol consumption, as well as a history of problematic alcohol abuse or dependence.  Recreational drugs: Denied. Caffeine: Denied.   Family History: Problem Relation Age of Onset  . Hypertension Mother   . Alzheimer's disease Mother   . Heart attack Father   . Cancer Father   . CAD Father   . Alzheimer's disease Father   . Diverticulitis Sister   . Obesity Sister   . Hypertension Sister   .  Voice disorder Brother    This information was confirmed by Ms. Johney Maine.  Academic/Vocational History: Highest level of educational attainment: 16 years. She earned a Dietitian in elementary education. 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 Math, specifically algebra, was noted as a likely relative weakness.  History of developmental delay: Denied. History of grade repetition: Denied. Enrollment in special education courses: Denied. History of LD/ADHD: Denied.  Employment: Retired. 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   Evaluation Results:   Behavioral Observations: [name] was unaccompanied, arrived to her appointment on time, and was appropriately dressed and groomed. She appeared alert and oriented. She ambulated slowly with the assistance of a cane. Her balance was mildly unsteady. Rusher motor functioning appeared intact upon informal observation and no abnormal movements (e.g., tremors) were noted. Oculomotor functioning (e.g., vertical and horizontal gaze palsy) was within normal limits. Her affect was somewhat flat at times, but did range appropriately given the subject being discussed during the clinical interview or the task at hand during testing procedures. Spontaneous speech was fluent. Mild word finding difficulties were observed during the clinical interview. Thought processes were coherent, organized, and normal in content. Insight into her cognitive difficulties appeared somewhat limited in that subjective complaints seemed to outweigh objective deficits. During testing, sustained attention was appropriate. Task engagement was adequate and she persisted when challenged. Overall, [name] was cooperative with the clinical interview and subsequent testing procedures.   Adequacy of Effort: The validity of neuropsychological testing is limited by the extent to which the individual being tested may be assumed to have exerted adequate effort during testing. [name] expressed her intention to perform to the best of her abilities and exhibited adequate task engagement and persistence. Scores across stand-alone and embedded performance validity  measures were within expectation. As such, the results of the current evaluation are believed to be a valid representation of Ms. Whitty' current cognitive functioning.  Test Results: Ms. Stohr was fully oriented at the time of the current evaluation.  Intellectual abilities based upon educational and vocational attainment were estimated to be in the average range. Premorbid abilities were estimated to be within the well above average range based upon a single-word reading test.   Processing speed was variable, ranging from the below average to above average normative ranges. Basic attention was above average. More complex attention (e.g., working memory) was average. Executive functioning was largely average to above average. A normative and relative weakness was exhibited across an unstructured card sorting test assessing pattern recognition and adaptability.  Assessed receptive language abilities were average. Likewise, Ms. Tetter did not exhibit any difficulties comprehending task instructions and answered all questions asked of her appropriately. Sentence repetition was average and performance on a semantic knowledge screening instrument with within normal limits. Assessed expressive language (e.g., verbal fluency and confrontation naming) was average to exceptionally high.     Assessed visuospatial/visuoconstructional abilities were variable, ranging from the below average to above average normative ranges.    Learning (i.e., encoding) of novel verbal and visual information was average to exceptionally high. Spontaneous delayed recall (i.e., retrieval) of previously learned information was above average to well above average. Retention rates were 100% across a story learning task, 100% across a list learning task, and 92% across a shape learning task. Performance across recognition tasks was appropriate, suggesting evidence for information consolidation.   Results of emotional screening  instruments  suggested that recent symptoms of generalized anxiety were in the moderate range, while symptoms of depression were within the mild range. A screening instrument assessing recent sleep quality suggested the presence of moderate sleep dysfunction.  Tables of Scores:   Note: This summary of test scores accompanies the interpretive report and should not be considered in isolation without reference to the appropriate sections in the text. Descriptors are based on appropriate normative [name] and may be adjusted based on clinical judgment. The terms "impaired" and "within normal limits (WNL)" are used when a more specific level of functioning cannot be determined.       Effort Testing:   DESCRIPTOR       ACS Word Choice: --- --- Within Expectation  CVLT-III Forced Choice Recognition: --- --- Within Expectation  BVMT-R Retention Percentage: --- --- Within Expectation       Orientation:      Raw Score Percentile   NAB Orientation, Form 1 29/29 --- ---       Intellectual Functioning:           Standard Score Percentile   Test of Premorbid Functioning: [id number] Well Above Average       Memory:          Wechsler Memory Scale (WMS-IV):                       Raw Score (Scaled Score) Percentile     Logical Memory I 34/53 (11) 63 Average    Logical Memory II 24/39 (12) 75 Above Average    Logical Memory Recognition 21/23 51-75 Average       [place] Verbal Learning Test (CVLT-III) Brief Form: Raw Score (Scaled/Standard Score) Percentile     Total Trials 1-4 30/36 (114) 82 Above Average    Short-Delay Free Recall 8/9 (12) 75 Above Average    Long-Delay Free Recall 8/9 (13) 84 Above Average    Long-Delay Cued Recall 8/9 (12) 75 Above Average      Recognition Hits 9/9 (12) 75 Above Average      False Positive Errors 0 (12) 75 Above Average       Brief Visuospatial Memory Test (BVMT-R), Form 1: Raw Score (T Score) Percentile     Total Trials 1-3 34/36 (72) 99 Exceptionally High     Delayed Recall 11/12 (63) 91 Well Above Average    Recognition Discrimination Index 6 >16 Within Normal Limits      Recognition Hits 6/6 >16 Within Normal Limits      False Positive Errors 0 >16 Within Normal Limits        Attention/Executive Function:          Trail Making Test (TMT): Raw Score (T Score) Percentile     Part A 47 secs.,  0 errors (37) 9 Below Average    Part B 60 secs.,  0 errors (56) 73 Average         Scaled Score Percentile   WAIS-IV Coding: 9 37 Average       NAB Attention Module, Form 1: T Score Percentile     Digits Forward 62 88 Above Average    Digits Backwards 45 31 Average       D-KEFS Color-Word Interference Test: Raw Score (Scaled Score) Percentile     Color Naming 32 secs. (10) 50 Average    Word Reading 21 secs. (12) 75 Above Average    Inhibition 72 secs. (9) 37 Average      Total  Errors 0 errors (12) 75 Above Average    Inhibition/Switching 66 secs. (12) 75 Above Average      Total Errors 3 errors (10) 50 Average       D-KEFS Verbal Fluency Test: Raw Score (Scaled Score) Percentile     Letter Total Correct 54 (15) 95 Well Above Average    Category Total Correct 50 (16) 98 Exceptionally High    Category Switching Total Correct 12 (9) 37 Average    Category Switching Accuracy 11 (10) 50 Average      Total Set Loss Errors 0 (13) 84 Above Average      Total Repetition Errors 0 (13) 84 Above Average       D-KEFS 20 Questions Test: Scaled Score Percentile     Total Weighted Achievement Score 15 95 Well Above Average    Initial Abstraction Score 11 63 Average       [place] Card Sorting Test [facility]): Raw Score Percentile     Categories (trials) 1 (64) 6-10 Well Below Average    Total Errors 36 3 Well Below Average    Perseverative Errors 21 4 Well Below Average    Non-Perseverative Errors 15 5 Well Below Average    Failure to Maintain Set 0 --- ---       Language:           Raw Score Percentile   PPVT Screening Instrument: 15/16 ---  Within Expectation  Sentence Repetition: 14/22 27 Average       NAB Language Module, Form 1: T Score Percentile     Auditory Comprehension 56 73 Average    Naming 31/31 (56) 73 Average       Visuospatial/Visuoconstruction:      Raw Score Percentile   Clock Drawing: 10/10 --- Within Normal Limits       NAB Spatial Module, Form 1: T Score Percentile     Visual Discrimination 45 31 Average    Figure Drawing Copy 41 18 Below Average        Scaled Score Percentile   WAIS-IV Block Design: 8 25 Average  WAIS-IV Visual Puzzles: 14 91 Above Average       Mood and Personality:      Raw Score Percentile   Geriatric Depression Scale: 10 --- Mild  Geriatric Anxiety Scale: 25 --- Moderate    Somatic 12 --- Moderate    Cognitive 6 --- Mild    Affective 7 --- Moderate       Additional Questionnaires:      Raw Score Percentile   PROMIS Sleep Disturbance Questionnaire: 31 --- Moderate   Informed Consent and Coding/Compliance:   Ms. Bellar was provided with a verbal description of the nature and purpose of the present neuropsychological evaluation. Also reviewed were the foreseeable risks and/or discomforts and benefits of the procedure, limits of confidentiality, and mandatory reporting requirements of this provider. The patient was given the opportunity to ask questions and receive answers about the evaluation. Oral consent to participate was provided by the patient.   This evaluation was conducted by Christia Reading, Ph.D., licensed clinical neuropsychologist. Ms. Leo completed a comprehensive clinical interview with Dr. Melvyn Novas, billed as one unit [phone], and 150 minutes of cognitive testing and scoring, billed as one unit [phone] and four additional units [id number]. Psychometrist Milana Kidney, B.S., assisted Dr. Melvyn Novas with test administration and scoring procedures. As a separate and discrete service, Dr. Melvyn Novas spent a total of 180 minutes in interpretation and report writing billed as one  unit [id number]  and two units [id number].

## 2019-07-17 ENCOUNTER — Ambulatory Visit: Payer: PPO | Admitting: Podiatry

## 2019-07-18 ENCOUNTER — Ambulatory Visit: Payer: PPO | Admitting: Podiatry

## 2019-07-18 ENCOUNTER — Other Ambulatory Visit: Payer: Self-pay

## 2019-07-18 DIAGNOSIS — B351 Tinea unguium: Secondary | ICD-10-CM | POA: Diagnosis not present

## 2019-07-18 DIAGNOSIS — M79674 Pain in right toe(s): Secondary | ICD-10-CM

## 2019-07-18 DIAGNOSIS — E08621 Diabetes mellitus due to underlying condition with foot ulcer: Secondary | ICD-10-CM | POA: Diagnosis not present

## 2019-07-18 DIAGNOSIS — Z794 Long term (current) use of insulin: Secondary | ICD-10-CM | POA: Diagnosis not present

## 2019-07-18 DIAGNOSIS — L97521 Non-pressure chronic ulcer of other part of left foot limited to breakdown of skin: Secondary | ICD-10-CM

## 2019-07-18 DIAGNOSIS — I129 Hypertensive chronic kidney disease with stage 1 through stage 4 chronic kidney disease, or unspecified chronic kidney disease: Secondary | ICD-10-CM | POA: Diagnosis not present

## 2019-07-18 DIAGNOSIS — M79675 Pain in left toe(s): Secondary | ICD-10-CM

## 2019-07-18 DIAGNOSIS — E1165 Type 2 diabetes mellitus with hyperglycemia: Secondary | ICD-10-CM

## 2019-07-19 ENCOUNTER — Ambulatory Visit: Payer: PPO | Admitting: Internal Medicine

## 2019-07-19 ENCOUNTER — Encounter: Payer: Self-pay | Admitting: Internal Medicine

## 2019-07-19 VITALS — BP 121/65 | HR 71 | Ht 69.0 in | Wt 273.6 lb

## 2019-07-19 DIAGNOSIS — R002 Palpitations: Secondary | ICD-10-CM

## 2019-07-19 DIAGNOSIS — I10 Essential (primary) hypertension: Secondary | ICD-10-CM | POA: Diagnosis not present

## 2019-07-19 NOTE — Progress Notes (Signed)
Subjective: 68 year old female presents the office today for concerns of an ulcer to the left foot.  She has been applying medihoney to the wound daily.  She thinks the wound has been getting better.  She is been wearing the offloading shoe.  Denies any drainage or pus or any redness or red streaks.  She also is asking for her nails be trimmed today. Denies any systemic complaints such as fevers, chills, nausea, vomiting. No acute changes since last appointment, and no other complaints at this time.   Objective: AAO x3, NAD DP/PT pulses palpable bilaterally, CRT less than 3 seconds Ulceration present on the left foot submetatarsal 1.  After debridement the wound measures 0.8 x 0.5 x 0.1 cm with a granular wound base.  No surrounding erythema, ascending cellulitis.  No ulceration crepitation.  No malodor. Nails are hypertrophic, dystrophic, brittle, discolored, elongated 10. No surrounding redness or drainage. Tenderness nails 1-5 except the left third toe which is partially amputated. No open lesions or pre-ulcerative lesions are identified today. Partial amputation left third toe.  Significant bunion deformities present. No open lesions or pre-ulcerative lesions.  No pain with calf compression, swelling, warmth, erythema  Assessment: Ulceration left foot without signs of infection; symptomatic onychomycosis  Plan: -All treatment options discussed with the patient including all alternatives, risks, complications.  -Sharply debrided the wound today was in the 312 with scalpel to any complications or bleeding.  Recommend switch to iodoform dressing changes daily.  This was dispensed today.  Continue offloading.  Monitor for any signs or symptoms of infection -Debrided nails times mild without complications or bleeding -Patient encouraged to call the office with any questions, concerns, change in symptoms.   Trula Slade DPM

## 2019-07-19 NOTE — Progress Notes (Signed)
OFFICE CONSULT NOTE  Chief Complaint:  Palpitations, history of Takatsubo's cardiomyopathy  Primary Care Physician: [name], MD  HPI:  [name] is a 69 y.o. female who is being seen today for the evaluation of palpitations at the request of [name], MD.  [name] is referred today for evaluation of palpitations and tachycardia.  She reports episodes of racing heartbeat at night, 3 of which she can recall specifically, however they occurred last December and she has not had any more episodes in the past 5 months.  She has remote history of Takatsubo cardiomyopathy.  Apparently this was a diagnosis she received in 2009 when she presented with chest pain.  She says she had a heart catheterization in 2009 and was told she did not have any coronary disease.  She felt that this was at [facility], but I cannot find records.  I can also not find records of her prior echocardiogram.  She said she saw a cardiologist at [facility], but he is no longer practicing there.  She denies any chest pain or worsening shortness of breath.  Recent labs were reviewed from February 2019 which showed total cholesterol 166, HDL 48, LDL 80 and triglycerides 190.  Hemoglobin A1c was 6.4.  04/19/2019  [name] returns today for follow-up.  I last saw her via telemedicine visit.  She was having some worsening edema and I increased her Lasix.  Since then there is been additional weight gain from 268 up to 287 pounds.  She still struggling with edema and some neuropathy.  She has a upcoming appointment with Dr. Delice Lesch in February with neurology.  Recently she has had some labile blood pressures.  In fact her blood pressure has been elevated and heart rate as well.  She has been undergoing injections of her knee with minimal benefit.  She had been in discussions with the clinical pharmacist at our practice about possibly changing her medicines, but no adjustment had been made.  07/19/2019  [name] is seen  today in follow-up.  There has been some improvement in her edema as well as some additional weight loss.  Palpitations have improved with increase in beta-blocker as well and her blood pressure was normal today 121/65.  Other times it seems to be elevated she says but I do feel like there is been interval improvement.  PMHx:  Past Medical History:  Diagnosis Date  . CKD (chronic kidney disease) 06/07/2016   Stage 2, GFR 60-89 ml/min  . Diabetic polyneuropathy 06/07/2016  . Dyslipidemia   . Endometrial adenocarcinoma   . Esophageal reflux   . Fever blister   . Hyperlipidemia   . Hypertension, essential 06/07/2016  . Major depressive disorder   . Mixed dyslipidemia 06/07/2016  . Obstructive sleep apnea 06/07/2016  . Peripheral neuropathy   . Retinopathy   . Severe obesity   . Skin ulcer of left foot with fat layer exposed 11/02/2016  . Stroke    Asymptomatic, discovered via neuroimaging; small infarct in left parietal lobe; also concern for small b/l frontal infarcts  . Tachycardia 07/19/2017  . Type 2 diabetes mellitus with hyperglycemia, with long-term current use of insulin 06/07/2016  . Unilateral primary osteoarthritis, right knee 04/16/2019    Past Surgical History:  Procedure Laterality Date  . ADRENALECTOMY    . BUNIONECTOMY    . CARDIAC CATHETERIZATION    . CERVICAL SPINE SURGERY    . CHOLECYSTECTOMY    . LAPAROSCOPIC SALPINGOOPHERECTOMY    .  THORACOTOMY    . TONSILLECTOMY      FAMHx:  Family History  Problem Relation Age of Onset  . Hypertension Mother   . Alzheimer's disease Mother   . Heart attack Father   . Cancer Father   . CAD Father   . Alzheimer's disease Father   . Diverticulitis Sister   . Obesity Sister   . Hypertension Sister   . Voice disorder Brother     SOCHx:   reports that she has never smoked. She has never used smokeless tobacco. She reports that she does not drink alcohol or use drugs.  ALLERGIES:  Allergies  Allergen Reactions  .  Ciprofloxacin Other (See Comments)    Unknown  . Penicillins Other (See Comments)    Unknown  . Sulfa Antibiotics   . Sulfamethoxazole Other (See Comments)    Unknown    ROS: Pertinent items noted in HPI and remainder of comprehensive ROS otherwise negative.  HOME MEDS: Current Outpatient Medications on File Prior to Visit  Medication Sig Dispense Refill  . amitriptyline (ELAVIL) 10 MG tablet Take 1 tablet (10 mg total) by mouth at bedtime. 20 tablet 0  . Ascorbic Acid (VITAMIN C) 100 MG tablet Take 100 mg by mouth daily.    Marland Kitchen buPROPion (WELLBUTRIN XL) 300 MG 24 hr tablet Take 300 mg by mouth daily.    . carvedilol (COREG) 6.25 MG tablet Take 1 tablet (6.25 mg total) by mouth 2 (two) times daily. 180 tablet 1  . Cholecalciferol (VITAMIN D3) 1.25 MG (50000 UT) TABS Take by mouth.    Marland Kitchen FLUoxetine (PROZAC) 40 MG capsule Take 40 mg by mouth daily.   0  . furosemide (LASIX) 40 MG tablet Take 1 tablet (40 mg total) by mouth daily. 90 tablet 3  . insulin NPH-regular Human (NOVOLIN 70/30) (70-30) 100 UNIT/ML injection Inject into the skin. 24 ml in the morning and 20 ml in the evening    . Inulin (FIBER CHOICE PO) Take by mouth.    . Lancets (ONETOUCH DELICA PLUS [id number]) MISC USE AS DIRECTED 3 TIMES A DAY    . losartan (COZAAR) 100 MG tablet Take 100 mg by mouth daily.     . magnesium citrate SOLN Take 1 Bottle by mouth once.    . metFORMIN (GLUCOPHAGE-XR) 500 MG 24 hr tablet Take 1,000 mg by mouth in the morning and at bedtime. PT TAKES 2000 MG DAILY    . MONOJECT INSULIN SYRINGE 31G X 5/16" 1 ML MISC See admin instructions.    . mupirocin ointment (BACTROBAN) 2 % Apply 1 application topically 2 (two) times daily. 30 g 2  . naproxen (NAPROSYN) 250 MG tablet Take 1 tablet by mouth in the morning and at bedtime.    Marland Kitchen nystatin cream (MYCOSTATIN) 1 APPLICATION TWICE A DAY EXTERNALLY    . ONETOUCH VERIO test strip     . pravastatin (PRAVACHOL) 40 MG tablet Take 1 tablet once a day for  cholesterol    . SODIUM CHLORIDE, EXTERNAL, (SALINE WOUND [place]) 0.9 % SOLN Cleanse left foot ulcer with saline once daily. 210 mL 0  . zinc sulfate 220 (50 Zn) MG capsule Take 220 mg by mouth daily.     No current facility-administered medications on file prior to visit.    LABS/IMAGING: No results found for this or any previous visit (from the past 48 hour(s)). No results found.  LIPID PANEL:    Component Value Date/Time   CHOL  07/31/2007 0930  117        ATP III CLASSIFICATION:  <200     mg/dL   Desirable  200-239  mg/dL   Borderline High  >=240    mg/dL   High   TRIG 211 (H) 07/31/2007 0930   HDL 29 (L) 07/31/2007 0930   CHOLHDL 4.0 07/31/2007 0930   VLDL 42 (H) 07/31/2007 0930   LDLCALC  07/31/2007 0930    46        Total Cholesterol/HDL:CHD Risk Coronary Heart Disease Risk Table                     Men   Women  1/2 Average Risk   3.4   3.3    WEIGHTS: Wt Readings from Last 3 Encounters:  07/19/19 273 lb 9.6 oz (124.1 kg)  05/13/19 285 lb (129.3 kg)  05/03/19 281 lb 9.6 oz (127.7 kg)    VITALS: BP 121/65   Pulse 71   Ht 5\' 9"  (1.753 m)   Wt 273 lb 9.6 oz (124.1 kg)   SpO2 98%   BMI 40.40 kg/m   EXAM: Deferred  EKG: Deferred  ASSESSMENT: 1. Palpitations/tachycardia 2. History of Takatsubo cardiomyopathy 3. Possible heart cath-no known coronary disease 4. Hypertension 5. Dyslipidemia 6. Type 2 diabetes on insulin 7. Near morbid obesity  PLAN: 1.   Mrs. Gainer has had improvement in blood pressure as well as palpitations.  With the increase in her diuretic her weight is down some and she denies any worsening shortness of breath.  No changes in her medicines today.  Plan follow-up with me in 6 months or sooner as necessary.  Pixie Casino, MD, [facility] [place] Director of the Advanced Lipid Disorders &  Cardiovascular Risk Reduction Clinic Diplomate of the American Board of Clinical Lipidology Attending  Cardiologist  Direct Dial: [phone]  Fax: [phone]  Website:  [email or web address]  Nadean Corwin Agatha Duplechain 07/19/2019, 2:16 PM

## 2019-07-19 NOTE — Patient Instructions (Signed)

## 2019-07-22 ENCOUNTER — Other Ambulatory Visit: Payer: Self-pay

## 2019-07-22 ENCOUNTER — Ambulatory Visit (INDEPENDENT_AMBULATORY_CARE_PROVIDER_SITE_OTHER): Payer: PPO | Admitting: Psychology

## 2019-07-22 ENCOUNTER — Encounter: Payer: Self-pay | Admitting: Psychology

## 2019-07-22 ENCOUNTER — Encounter: Payer: PPO | Admitting: Counselor

## 2019-07-22 DIAGNOSIS — G4733 Obstructive sleep apnea (adult) (pediatric): Secondary | ICD-10-CM | POA: Diagnosis not present

## 2019-07-22 DIAGNOSIS — I639 Cerebral infarction, unspecified: Secondary | ICD-10-CM

## 2019-07-22 DIAGNOSIS — F330 Major depressive disorder, recurrent, mild: Secondary | ICD-10-CM

## 2019-07-22 DIAGNOSIS — F411 Generalized anxiety disorder: Secondary | ICD-10-CM | POA: Diagnosis not present

## 2019-07-22 NOTE — Progress Notes (Signed)
   Neuropsychology Feedback Session [name]. [place] Department of Neurology  Reason for Referral:   [name] a 69 y.o. right-handed Caucasian female referred by Ellouise Newer, M.D.,to characterize hercurrent cognitive functioning and assist with diagnostic clarity and treatment planning in the context of subjective cognitive decline, several cardiovascular comorbidities, and a history of several small cerebral infarcts in the left parietal and bilateral frontal lobes.   Feedback:   [name] completed a comprehensive neuropsychological evaluation on 07/15/2019. Please refer to that encounter for the full report and recommendations. Briefly, results suggested neuropsychological functioning within normal limits. An isolated weakness was exhibited across an unstructured card sorting test assessing pattern recognition and adaptability; however, performance across all other executive functioning assessments was appropriate. Variability (below average to above average normative ranges) was also seen across processing speed and visuospatial functioning. Performance variability across visuospatial abilities could certainly be related to lesions in the parietal lobe, while trouble with processing speed and executive functioning could certainly related to lesions in the frontal lobes. Based upon current testing, cognitive deficits appear subtle to mild at best. Also important to note, [name] reported a history of obstructive sleep apnea, moderate ongoing sleep dysfunction, and has not been using her CPAP machine. She also reported moderate levels of anxiety and mild levels of depression on related questionnaires. Given minimal deficits on testing, it is possible that a majority of performance weaknesses are actually due to ongoing sleep and psychiatric factors more so than her stroke history.  [name] was unaccompanied. Content of the current session focused on the results  of her neuropsychological evaluation. [name] was given the opportunity to ask questions and her questions were answered. She was encouraged to reach out should additional questions arise. A copy of her report was mailed at the conclusion of the visit.      Between 16 and 37 minutes were spent conducting the current feedback session with [name], billed as one unit [phone].

## 2019-07-24 ENCOUNTER — Telehealth: Payer: Self-pay | Admitting: Neurology

## 2019-07-24 NOTE — Telephone Encounter (Signed)
Pt needs to speak to someone about her having a hallucination  Please call

## 2019-07-24 NOTE — Telephone Encounter (Signed)
Line busy at 1:44

## 2019-07-25 DIAGNOSIS — Z794 Long term (current) use of insulin: Secondary | ICD-10-CM | POA: Diagnosis not present

## 2019-07-25 DIAGNOSIS — Z89411 Acquired absence of right great toe: Secondary | ICD-10-CM | POA: Diagnosis not present

## 2019-07-25 DIAGNOSIS — E1142 Type 2 diabetes mellitus with diabetic polyneuropathy: Secondary | ICD-10-CM | POA: Diagnosis not present

## 2019-07-25 DIAGNOSIS — Z5181 Encounter for therapeutic drug level monitoring: Secondary | ICD-10-CM | POA: Diagnosis not present

## 2019-07-25 NOTE — Telephone Encounter (Signed)
No answer again at 11:29am

## 2019-07-25 NOTE — Telephone Encounter (Signed)
Left message for patient to call with details, symptoms.

## 2019-07-26 ENCOUNTER — Telehealth: Payer: Self-pay

## 2019-07-26 ENCOUNTER — Other Ambulatory Visit: Payer: Self-pay

## 2019-07-26 DIAGNOSIS — R441 Visual hallucinations: Secondary | ICD-10-CM

## 2019-07-26 NOTE — Telephone Encounter (Signed)
Pt called and will go to eye dr and EEG.

## 2019-07-26 NOTE — Telephone Encounter (Signed)
Agree with seeing an eye doctor. Would also like to do a brain wave test, pls order EEG (1-hour), dx: visual hallucinations. Thanks!

## 2019-07-26 NOTE — Telephone Encounter (Signed)
Patient left voicemail returning Christy's phone call

## 2019-07-26 NOTE — Telephone Encounter (Signed)
Patient returning call.

## 2019-07-26 NOTE — Telephone Encounter (Signed)
I called patient she had an epiosode on Tuesday had an epiode of halluications one time. She was in bathroom, had door open and was looking in the dining room. She thought she was seeing trees on her wall, the room was dark, though lights were on. She stated,"it felt like she was moving in a vehicle, denys any loss of consciousness, no UTI symptoms, no missed medications.. Patient was wondering what your recommendations would be, she also wondered if she should go to the Opthmalogist for eye exam.

## 2019-07-31 ENCOUNTER — Other Ambulatory Visit: Payer: Self-pay

## 2019-07-31 ENCOUNTER — Ambulatory Visit: Payer: PPO | Admitting: Neurology

## 2019-07-31 DIAGNOSIS — R441 Visual hallucinations: Secondary | ICD-10-CM

## 2019-08-01 ENCOUNTER — Ambulatory Visit (INDEPENDENT_AMBULATORY_CARE_PROVIDER_SITE_OTHER): Payer: PPO

## 2019-08-01 ENCOUNTER — Encounter: Payer: Self-pay | Admitting: Podiatry

## 2019-08-01 ENCOUNTER — Ambulatory Visit: Payer: PPO | Admitting: Podiatry

## 2019-08-01 DIAGNOSIS — E08621 Diabetes mellitus due to underlying condition with foot ulcer: Secondary | ICD-10-CM | POA: Diagnosis not present

## 2019-08-01 DIAGNOSIS — M779 Enthesopathy, unspecified: Secondary | ICD-10-CM

## 2019-08-01 DIAGNOSIS — M7752 Other enthesopathy of left foot: Secondary | ICD-10-CM | POA: Diagnosis not present

## 2019-08-01 DIAGNOSIS — L97521 Non-pressure chronic ulcer of other part of left foot limited to breakdown of skin: Secondary | ICD-10-CM | POA: Diagnosis not present

## 2019-08-02 DIAGNOSIS — H02834 Dermatochalasis of left upper eyelid: Secondary | ICD-10-CM | POA: Diagnosis not present

## 2019-08-02 DIAGNOSIS — H43813 Vitreous degeneration, bilateral: Secondary | ICD-10-CM | POA: Diagnosis not present

## 2019-08-02 DIAGNOSIS — H2513 Age-related nuclear cataract, bilateral: Secondary | ICD-10-CM | POA: Diagnosis not present

## 2019-08-02 DIAGNOSIS — H02831 Dermatochalasis of right upper eyelid: Secondary | ICD-10-CM | POA: Diagnosis not present

## 2019-08-05 NOTE — Procedures (Signed)
ELECTROENCEPHALOGRAM REPORT  Date of Study: 07/31/2019  Patient's Name: [name] MRN: [id number] Date of Birth: [birth year]  Referring Provider: Dr. Ellouise Newer  Clinical History: This is a 69 year old woman with visual hallucinations.  Medications: VITAMIN C 100 MG tablet WELLBUTRIN XL 300 MG 24 hr tablet COREG 6.25 MG tablet VITAMIN D3 1.25 MG (50000 UT) TABS PROZAC 40 MG capsule LASIX 40 MG tablet NOVOLIN 70/30) (70-30) 100 UNIT/ML  FIBER CHOICE PO COZAAR 100 MG tablet magnesium citrate SOLN GLUCOPHAGE-XR 500 MG 24 hr tablet BACTROBAN 2 % NAPROSYN 250 MG tablet MYCOSTATIN PRAVACHOL  40 MG tablet zinc sulfate 220 (50 Zn) MG capsule   Technical Summary: A multichannel digital 1-hour EEG recording measured by the international 10-20 system with electrodes applied with paste and impedances below 5000 ohms performed in our laboratory with EKG monitoring in an awake and asleep patient.  Hyperventilation was not performed. Photic stimulation was performed.  The digital EEG was referentially recorded, reformatted, and digitally filtered in a variety of bipolar and referential montages for optimal display.    Description: The patient is awake and asleep during the recording.  During maximal wakefulness, there is a symmetric, medium voltage 10 Hz posterior dominant rhythm that attenuates with eye opening.  The record is symmetric.  During drowsiness and sleep, there is an increase in theta slowing of the background.  Vertex waves and symmetric sleep spindles were seen.  Photic stimulation did not elicit any abnormalities.  There were no epileptiform discharges or electrographic seizures seen.    EKG lead was unremarkable.  Impression: This 1-hour awake and asleep EEG is normal.    Clinical Correlation: A normal EEG does not exclude a clinical diagnosis of epilepsy.  If further clinical questions remain, prolonged EEG may be helpful.  Clinical correlation is advised.   Ellouise Newer, M.D.

## 2019-08-07 NOTE — Progress Notes (Signed)
Subjective: 69 year old female presents the office today for follow-up evaluation of an ulcer to the left foot.  She states that she is doing better.  She has been continuing with iodosorb gel.  She denies any drainage or pus.  No swelling or redness.  She feels that the wound is healing.  She also secondary concerns as she has some swelling to the ankle.  No recent treatment.  Denies any systemic complaints such as fevers, chills, nausea, vomiting. No acute changes since last appointment, and no other complaints at this time.   Objective: AAO x3, NAD DP/PT pulses palpable bilaterally, CRT less than 3 seconds Ulceration present on the left foot submetatarsal 1.  After debridement the wound measures 0.6 x 0.3 x 0.1 cm with a granular wound base.  No surrounding erythema, ascending cellulitis.  No ulceration crepitation.  No malodor. There is mild edema to the left ankle but no erythema or warmth.  Mild tenderness of the ankle but there is no specific area pinpoint tenderness.  Ankle, subtalar range of motion intact. No open lesions or pre-ulcerative lesions.  No pain with calf compression, swelling, warmth, erythema  Assessment: Ulceration left foot without signs of infection- improving; ankle pain  Plan: -All treatment options discussed with the patient including all alternatives, risks, complications.  -X-rays obtained and reviewed.  No evidence of acute fracture.  I do think part of her ankle pain is coming from the shoe.  We discussed switching to a boot but she cannot tolerate this.  Ice to the area.  Will monitor. -Sharply debrided the wound today was in the 312 with scalpel to any complications or bleeding.  Continue with Iodosorb dressing changes daily. -Patient encouraged to call the office with any questions, concerns, change in symptoms.   Trula Slade DPM

## 2019-08-14 DIAGNOSIS — I10 Essential (primary) hypertension: Secondary | ICD-10-CM | POA: Diagnosis not present

## 2019-08-14 DIAGNOSIS — N183 Chronic kidney disease, stage 3 unspecified: Secondary | ICD-10-CM | POA: Diagnosis not present

## 2019-08-14 DIAGNOSIS — F331 Major depressive disorder, recurrent, moderate: Secondary | ICD-10-CM | POA: Diagnosis not present

## 2019-08-14 DIAGNOSIS — E1140 Type 2 diabetes mellitus with diabetic neuropathy, unspecified: Secondary | ICD-10-CM | POA: Diagnosis not present

## 2019-08-14 DIAGNOSIS — E1121 Type 2 diabetes mellitus with diabetic nephropathy: Secondary | ICD-10-CM | POA: Diagnosis not present

## 2019-08-14 DIAGNOSIS — I2510 Atherosclerotic heart disease of native coronary artery without angina pectoris: Secondary | ICD-10-CM | POA: Diagnosis not present

## 2019-08-14 DIAGNOSIS — I129 Hypertensive chronic kidney disease with stage 1 through stage 4 chronic kidney disease, or unspecified chronic kidney disease: Secondary | ICD-10-CM | POA: Diagnosis not present

## 2019-08-14 DIAGNOSIS — E11319 Type 2 diabetes mellitus with unspecified diabetic retinopathy without macular edema: Secondary | ICD-10-CM | POA: Diagnosis not present

## 2019-08-14 DIAGNOSIS — F325 Major depressive disorder, single episode, in full remission: Secondary | ICD-10-CM | POA: Diagnosis not present

## 2019-08-14 DIAGNOSIS — E785 Hyperlipidemia, unspecified: Secondary | ICD-10-CM | POA: Diagnosis not present

## 2019-08-29 ENCOUNTER — Other Ambulatory Visit: Payer: Self-pay

## 2019-08-29 ENCOUNTER — Ambulatory Visit: Payer: PPO | Admitting: Podiatry

## 2019-08-29 DIAGNOSIS — E08621 Diabetes mellitus due to underlying condition with foot ulcer: Secondary | ICD-10-CM

## 2019-08-29 DIAGNOSIS — L97521 Non-pressure chronic ulcer of other part of left foot limited to breakdown of skin: Secondary | ICD-10-CM

## 2019-09-02 DIAGNOSIS — F339 Major depressive disorder, recurrent, unspecified: Secondary | ICD-10-CM | POA: Diagnosis not present

## 2019-09-02 DIAGNOSIS — L299 Pruritus, unspecified: Secondary | ICD-10-CM | POA: Diagnosis not present

## 2019-09-02 DIAGNOSIS — I129 Hypertensive chronic kidney disease with stage 1 through stage 4 chronic kidney disease, or unspecified chronic kidney disease: Secondary | ICD-10-CM | POA: Diagnosis not present

## 2019-09-02 DIAGNOSIS — R079 Chest pain, unspecified: Secondary | ICD-10-CM | POA: Diagnosis not present

## 2019-09-02 DIAGNOSIS — E785 Hyperlipidemia, unspecified: Secondary | ICD-10-CM | POA: Diagnosis not present

## 2019-09-03 NOTE — Progress Notes (Signed)
Subjective: 69 year old female presents the office today for follow-up evaluation of an ulcer to the left foot.  She has been on atenolol for the last 3 weeks [employer] work and [place] on her [employer].  She is on her feet more.  She has been changing the bandage daily.  She is not sure how the wound is been doing but she denies any increase in swelling or redness or any red streaks.  No pain in the ankle at this time.  Denies any fevers, chills, nausea, vomiting.  No calf pain, chest pain, shortness of breath.    Objective: AAO x3, NAD DP/PT pulses palpable bilaterally, CRT less than 3 seconds Ulceration present on the left foot submetatarsal 1.  Small blister adjacent to the wound.  After debridement the wound today does measure larger 1 x 0.6 x 0.3 cm.  There is concern erythema, ascending cellulitis there is no fluctuation crepitation.  There is no malodor.  No new ulcerations are identified otherwise. No open lesions or pre-ulcerative lesions.  No pain with calf compression, swelling, warmth, erythema  Assessment: Ulceration left foot without signs of infection  Plan: -All treatment options discussed with the patient including all alternatives, risks, complications.  -Debrided the wound today utilizing the 312 with scalpel without any complications none healthy, viable, granular tissue.  No signs of an abscess or deeper infection noted today.  We will continue with Medihoney dressing changes daily.  Continue offloading at all times.  Hopefully now she does have her feet more work to allow this to heal. -Monitor for any clinical signs or symptoms of infection and directed to call the office immediately should any occur or go to the ER.  Trula Slade DPM

## 2019-09-04 DIAGNOSIS — I129 Hypertensive chronic kidney disease with stage 1 through stage 4 chronic kidney disease, or unspecified chronic kidney disease: Secondary | ICD-10-CM | POA: Diagnosis not present

## 2019-09-04 DIAGNOSIS — E1142 Type 2 diabetes mellitus with diabetic polyneuropathy: Secondary | ICD-10-CM | POA: Diagnosis not present

## 2019-09-04 DIAGNOSIS — I10 Essential (primary) hypertension: Secondary | ICD-10-CM | POA: Diagnosis not present

## 2019-09-04 DIAGNOSIS — F3341 Major depressive disorder, recurrent, in partial remission: Secondary | ICD-10-CM | POA: Diagnosis not present

## 2019-09-04 DIAGNOSIS — F3342 Major depressive disorder, recurrent, in full remission: Secondary | ICD-10-CM | POA: Diagnosis not present

## 2019-09-04 DIAGNOSIS — F325 Major depressive disorder, single episode, in full remission: Secondary | ICD-10-CM | POA: Diagnosis not present

## 2019-09-04 DIAGNOSIS — E785 Hyperlipidemia, unspecified: Secondary | ICD-10-CM | POA: Diagnosis not present

## 2019-09-04 DIAGNOSIS — I2510 Atherosclerotic heart disease of native coronary artery without angina pectoris: Secondary | ICD-10-CM | POA: Diagnosis not present

## 2019-09-04 DIAGNOSIS — N183 Chronic kidney disease, stage 3 unspecified: Secondary | ICD-10-CM | POA: Diagnosis not present

## 2019-09-04 DIAGNOSIS — E1165 Type 2 diabetes mellitus with hyperglycemia: Secondary | ICD-10-CM | POA: Diagnosis not present

## 2019-09-06 ENCOUNTER — Ambulatory Visit: Payer: PPO | Admitting: Podiatry

## 2019-09-06 ENCOUNTER — Other Ambulatory Visit: Payer: Self-pay

## 2019-09-06 DIAGNOSIS — L97521 Non-pressure chronic ulcer of other part of left foot limited to breakdown of skin: Secondary | ICD-10-CM | POA: Diagnosis not present

## 2019-09-06 DIAGNOSIS — E08621 Diabetes mellitus due to underlying condition with foot ulcer: Secondary | ICD-10-CM | POA: Diagnosis not present

## 2019-09-10 ENCOUNTER — Encounter: Payer: Self-pay | Admitting: Podiatry

## 2019-09-10 NOTE — Progress Notes (Signed)
Subjective:  [name], female    DOB: 10-26-1950,  MRN: [id number]  No chief complaint on file.   69 y.o. female presents for wound care.  Patient presents with a left foot ulceration that has been going on for quite some time.  Patient states that it might be getting a little bit bigger.  She has been using a Band-Aid.  And may also be rubbing in her boot as well.  Patient states it bleeds every time she changes bandages.  She denies any other acute complaints.  She is known to Dr. Jacqualyn Posey who has been managing this primarily.  She would like to know if she can do anything else as it appears they may be getting a little bit bigger.   Review of Systems: Negative except as noted in the HPI. Denies N/V/F/Ch.  Past Medical History:  Diagnosis Date  . CKD (chronic kidney disease) 06/07/2016   Stage 2, GFR 60-89 ml/min  . Diabetic polyneuropathy 06/07/2016  . Dyslipidemia   . Endometrial adenocarcinoma   . Esophageal reflux   . Fever blister   . Hyperlipidemia   . Hypertension, essential 06/07/2016  . Major depressive disorder   . Mixed dyslipidemia 06/07/2016  . Obstructive sleep apnea 06/07/2016  . Peripheral neuropathy   . Retinopathy   . Severe obesity   . Skin ulcer of left foot with fat layer exposed 11/02/2016  . Stroke    Asymptomatic, discovered via neuroimaging; small infarct in left parietal lobe; also concern for small b/l frontal infarcts  . Tachycardia 07/19/2017  . Type 2 diabetes mellitus with hyperglycemia, with long-term current use of insulin 06/07/2016  . Unilateral primary osteoarthritis, right knee 04/16/2019    Current Outpatient Medications:  .  amitriptyline (ELAVIL) 10 MG tablet, Take 1 tablet (10 mg total) by mouth at bedtime., Disp: 20 tablet, Rfl: 0 .  Ascorbic Acid (VITAMIN C) 100 MG tablet, Take 100 mg by mouth daily., Disp: , Rfl:  .  buPROPion (WELLBUTRIN XL) 300 MG 24 hr tablet, Take 300 mg by mouth daily., Disp: , Rfl:  .  carvedilol  (COREG) 6.25 MG tablet, Take 1 tablet (6.25 mg total) by mouth 2 (two) times daily., Disp: 180 tablet, Rfl: 1 .  Cholecalciferol (VITAMIN D3) 1.25 MG (50000 UT) TABS, Take by mouth., Disp: , Rfl:  .  FLUoxetine (PROZAC) 40 MG capsule, Take 40 mg by mouth daily. , Disp: , Rfl: 0 .  furosemide (LASIX) 40 MG tablet, Take 1 tablet (40 mg total) by mouth daily., Disp: 90 tablet, Rfl: 3 .  insulin NPH-regular Human (NOVOLIN 70/30) (70-30) 100 UNIT/ML injection, Inject into the skin. 24 ml in the morning and 20 ml in the evening, Disp: , Rfl:  .  Inulin (FIBER CHOICE PO), Take by mouth., Disp: , Rfl:  .  Lancets (ONETOUCH DELICA PLUS [id number]) MISC, USE AS DIRECTED 3 TIMES A DAY, Disp: , Rfl:  .  losartan (COZAAR) 100 MG tablet, Take 100 mg by mouth daily. , Disp: , Rfl:  .  magnesium citrate SOLN, Take 1 Bottle by mouth once., Disp: , Rfl:  .  metFORMIN (GLUCOPHAGE-XR) 500 MG 24 hr tablet, Take 1,000 mg by mouth in the morning and at bedtime. PT TAKES 2000 MG DAILY, Disp: , Rfl:  .  MONOJECT INSULIN SYRINGE 31G X 5/16" 1 ML MISC, See admin instructions., Disp: , Rfl:  .  mupirocin ointment (BACTROBAN) 2 %, Apply 1 application topically 2 (two) times daily., Disp: 30  g, Rfl: 2 .  naproxen (NAPROSYN) 250 MG tablet, Take 1 tablet by mouth in the morning and at bedtime., Disp: , Rfl:  .  nystatin cream (MYCOSTATIN), 1 APPLICATION TWICE A DAY EXTERNALLY, Disp: , Rfl:  .  ONETOUCH VERIO test strip, , Disp: , Rfl:  .  pravastatin (PRAVACHOL) 40 MG tablet, Take 1 tablet once a day for cholesterol, Disp: , Rfl:  .  SODIUM CHLORIDE, EXTERNAL, (SALINE WOUND [place]) 0.9 % SOLN, Cleanse left foot ulcer with saline once daily., Disp: 210 mL, Rfl: 0 .  zinc sulfate 220 (50 Zn) MG capsule, Take 220 mg by mouth daily., Disp: , Rfl:   Social History   Tobacco Use  Smoking Status Never Smoker  Smokeless Tobacco Never Used    Allergies  Allergen Reactions  . Ciprofloxacin Other (See Comments)    Unknown  .  Penicillins Other (See Comments)    Unknown  . Sulfa Antibiotics   . Sulfamethoxazole Other (See Comments)    Unknown   Objective:  There were no vitals filed for this visit. There is no height or weight on file to calculate BMI. Constitutional Well developed. Well nourished.  Vascular Dorsalis pedis pulses palpable bilaterally. Posterior tibial pulses palpable bilaterally. Capillary refill normal to all digits.  No cyanosis or clubbing noted. Pedal hair growth normal.  Neurologic Normal speech. Oriented to person, place, and time. Protective sensation absent  Dermatologic Wound Location: Left submetatarsal 1 ulceration limited to the breakdown of the skin Wound Base: Mixed Granular/Fibrotic Peri-wound: Calloused Exudate: None: wound tissue dry Wound Measurements: -See below  Orthopedic: No pain to palpation either foot.   Radiographs: None Assessment:  No diagnosis found. Plan:  Patient was evaluated and treated and all questions answered.  Ulcer left submet 1 ulceration limited to the breakdown of the skin -Debridement as below. -Dressed with triple antibiotic and a Band-Aid, DSD. -Continue off-loading with surgical shoe.  Procedure: Excisional Debridement of Wound Tool: Sharp chisel blade/tissue nipper Rationale: Removal of non-viable soft tissue from the wound to promote healing.  Anesthesia: none Pre-Debridement Wound Measurements: 1 cm x 0.70 cm x 0.3 cm  Post-Debridement Wound Measurements: 1.1 cm x 0.8 cm x 0.3 cm  Type of Debridement: Sharp Excisional Tissue Removed: Non-viable soft tissue Blood loss: Minimal (<50cc) Depth of Debridement: subcutaneous tissue. Technique: Sharp excisional debridement to bleeding, viable wound base.  Wound Progress: Compared to previous measurement the wound appears to be increasing. Site healing conversation 7 Dressing: Dry, sterile, compression dressing. Disposition: Patient tolerated procedure well. Patient to return in 1  week for follow-up.  No follow-ups on file.

## 2019-09-11 ENCOUNTER — Other Ambulatory Visit: Payer: Self-pay

## 2019-09-11 ENCOUNTER — Ambulatory Visit: Payer: PPO | Admitting: Podiatry

## 2019-09-11 VITALS — Temp 96.5°F

## 2019-09-11 DIAGNOSIS — E08621 Diabetes mellitus due to underlying condition with foot ulcer: Secondary | ICD-10-CM | POA: Diagnosis not present

## 2019-09-11 DIAGNOSIS — L97521 Non-pressure chronic ulcer of other part of left foot limited to breakdown of skin: Secondary | ICD-10-CM | POA: Diagnosis not present

## 2019-09-13 NOTE — Progress Notes (Signed)
Subjective: 69 year old female presents the office today for follow-up evaluation of an ulcer to the left foot.  She has of the wound is doing better because she has been taking better care of it.  She was showering but the pain is on the sock and the wound was getting quite wet.  She has stopped doing this as well as using too much hydrogen peroxide.  She keeping antibiotic ointment and a bandage on the wound daily.  She is wearing a surgical shoe.  Denies any fevers, chills, nausea, vomiting.  No calf pain, chest pain, shortness of breath.    Objective: AAO x3, NAD DP/PT pulses palpable bilaterally, CRT less than 3 seconds Ulceration present on the left foot submetatarsal 1.  Small blister adjacent to the wound.  There is no significant hyperkeratotic tissue today small meta fibrotic tissue.  I debrided the wound down to healthy, granular tissue.  Today wound measures 1 x 0.7 x 0.2 cm and is superficial.  There is no probing, undermining or tunneling.  Compared to the measurements last week with Dr. Posey [place] appears to be smaller. No pain with calf compression, swelling, warmth, erythema  Assessment: Ulceration left foot without signs of infection  Plan: -All treatment options discussed with the patient including all alternatives, risks, complications.  -Debrided the wound today utilizing the 312 with scalpel without any complications none healthy, viable, granular tissue.  No signs of an abscess or deeper infection noted today.  Continue daily dressing changes, offloading. -We will follow up with Beth for either diabetic shoes or custom shoes. -Monitor for any clinical signs or symptoms of infection and directed to call the office immediately should any occur or go to the ER.  Trula Slade DPM

## 2019-09-14 DIAGNOSIS — I129 Hypertensive chronic kidney disease with stage 1 through stage 4 chronic kidney disease, or unspecified chronic kidney disease: Secondary | ICD-10-CM | POA: Diagnosis not present

## 2019-09-16 DIAGNOSIS — I129 Hypertensive chronic kidney disease with stage 1 through stage 4 chronic kidney disease, or unspecified chronic kidney disease: Secondary | ICD-10-CM | POA: Diagnosis not present

## 2019-09-30 DIAGNOSIS — I129 Hypertensive chronic kidney disease with stage 1 through stage 4 chronic kidney disease, or unspecified chronic kidney disease: Secondary | ICD-10-CM | POA: Diagnosis not present

## 2019-09-30 DIAGNOSIS — E1140 Type 2 diabetes mellitus with diabetic neuropathy, unspecified: Secondary | ICD-10-CM | POA: Diagnosis not present

## 2019-09-30 DIAGNOSIS — H02834 Dermatochalasis of left upper eyelid: Secondary | ICD-10-CM | POA: Diagnosis not present

## 2019-09-30 DIAGNOSIS — E785 Hyperlipidemia, unspecified: Secondary | ICD-10-CM | POA: Diagnosis not present

## 2019-09-30 DIAGNOSIS — H2513 Age-related nuclear cataract, bilateral: Secondary | ICD-10-CM | POA: Diagnosis not present

## 2019-09-30 DIAGNOSIS — H524 Presbyopia: Secondary | ICD-10-CM | POA: Diagnosis not present

## 2019-09-30 DIAGNOSIS — F3342 Major depressive disorder, recurrent, in full remission: Secondary | ICD-10-CM | POA: Diagnosis not present

## 2019-09-30 DIAGNOSIS — N183 Chronic kidney disease, stage 3 unspecified: Secondary | ICD-10-CM | POA: Diagnosis not present

## 2019-09-30 DIAGNOSIS — E1165 Type 2 diabetes mellitus with hyperglycemia: Secondary | ICD-10-CM | POA: Diagnosis not present

## 2019-09-30 DIAGNOSIS — F339 Major depressive disorder, recurrent, unspecified: Secondary | ICD-10-CM | POA: Diagnosis not present

## 2019-09-30 DIAGNOSIS — I2510 Atherosclerotic heart disease of native coronary artery without angina pectoris: Secondary | ICD-10-CM | POA: Diagnosis not present

## 2019-09-30 DIAGNOSIS — E1121 Type 2 diabetes mellitus with diabetic nephropathy: Secondary | ICD-10-CM | POA: Diagnosis not present

## 2019-09-30 DIAGNOSIS — E11319 Type 2 diabetes mellitus with unspecified diabetic retinopathy without macular edema: Secondary | ICD-10-CM | POA: Diagnosis not present

## 2019-09-30 DIAGNOSIS — F4321 Adjustment disorder with depressed mood: Secondary | ICD-10-CM | POA: Diagnosis not present

## 2019-09-30 DIAGNOSIS — H43813 Vitreous degeneration, bilateral: Secondary | ICD-10-CM | POA: Diagnosis not present

## 2019-09-30 DIAGNOSIS — H02831 Dermatochalasis of right upper eyelid: Secondary | ICD-10-CM | POA: Diagnosis not present

## 2019-09-30 DIAGNOSIS — E1142 Type 2 diabetes mellitus with diabetic polyneuropathy: Secondary | ICD-10-CM | POA: Diagnosis not present

## 2019-10-02 ENCOUNTER — Ambulatory Visit: Payer: PPO | Admitting: Podiatry

## 2019-10-02 ENCOUNTER — Other Ambulatory Visit: Payer: Self-pay

## 2019-10-02 DIAGNOSIS — E08621 Diabetes mellitus due to underlying condition with foot ulcer: Secondary | ICD-10-CM | POA: Diagnosis not present

## 2019-10-02 DIAGNOSIS — Z794 Long term (current) use of insulin: Secondary | ICD-10-CM

## 2019-10-02 DIAGNOSIS — L97521 Non-pressure chronic ulcer of other part of left foot limited to breakdown of skin: Secondary | ICD-10-CM | POA: Diagnosis not present

## 2019-10-02 DIAGNOSIS — E1165 Type 2 diabetes mellitus with hyperglycemia: Secondary | ICD-10-CM | POA: Diagnosis not present

## 2019-10-04 ENCOUNTER — Encounter: Payer: Self-pay | Admitting: Podiatry

## 2019-10-04 NOTE — Progress Notes (Signed)
Subjective:  [name], female    DOB: [birth year],  MRN: [id number]  Chief Complaint  Patient presents with  . Wound Check    Left foot near great toe area. Pt states that the ulcr has been bleeding every day.     69 y.o. female presents for wound care.  Patient presents with a left foot ulceration that has been going on for quite some time. Patient states the wound seems to be stable. They have been applying Medihoney to it. She denies any other acute complaints. She is known to Dr. Earleen Newport and will follow back up with him in 3 weeks.   Review of Systems: Negative except as noted in the HPI. Denies N/V/F/Ch.  Past Medical History:  Diagnosis Date  . CKD (chronic kidney disease) 06/07/2016   Stage 2, GFR 60-89 ml/min  . Diabetic polyneuropathy 06/07/2016  . Dyslipidemia   . Endometrial adenocarcinoma   . Esophageal reflux   . Fever blister   . Hyperlipidemia   . Hypertension, essential 06/07/2016  . Major depressive disorder   . Mixed dyslipidemia 06/07/2016  . Obstructive sleep apnea 06/07/2016  . Peripheral neuropathy   . Retinopathy   . Severe obesity   . Skin ulcer of left foot with fat layer exposed 11/02/2016  . Stroke    Asymptomatic, discovered via neuroimaging; small infarct in left parietal lobe; also concern for small b/l frontal infarcts  . Tachycardia 07/19/2017  . Type 2 diabetes mellitus with hyperglycemia, with long-term current use of insulin 06/07/2016  . Unilateral primary osteoarthritis, right knee 04/16/2019    Current Outpatient Medications:  .  amitriptyline (ELAVIL) 10 MG tablet, Take 1 tablet (10 mg total) by mouth at bedtime., Disp: 20 tablet, Rfl: 0 .  Ascorbic Acid (VITAMIN C) 100 MG tablet, Take 100 mg by mouth daily., Disp: , Rfl:  .  buPROPion (WELLBUTRIN XL) 300 MG 24 hr tablet, Take 300 mg by mouth daily., Disp: , Rfl:  .  carvedilol (COREG) 6.25 MG tablet, Take 1 tablet (6.25 mg total) by mouth 2 (two) times daily., Disp: 180  tablet, Rfl: 1 .  Cholecalciferol (VITAMIN D3) 1.25 MG (50000 UT) TABS, Take by mouth., Disp: , Rfl:  .  FLUoxetine (PROZAC) 40 MG capsule, Take 40 mg by mouth daily. , Disp: , Rfl: 0 .  furosemide (LASIX) 40 MG tablet, Take 1 tablet (40 mg total) by mouth daily., Disp: 90 tablet, Rfl: 3 .  insulin NPH-regular Human (NOVOLIN 70/30) (70-30) 100 UNIT/ML injection, Inject into the skin. 24 ml in the morning and 20 ml in the evening, Disp: , Rfl:  .  Inulin (FIBER CHOICE PO), Take by mouth., Disp: , Rfl:  .  Lancets (ONETOUCH DELICA PLUS [id number]) MISC, USE AS DIRECTED 3 TIMES A DAY, Disp: , Rfl:  .  losartan (COZAAR) 100 MG tablet, Take 100 mg by mouth daily. , Disp: , Rfl:  .  magnesium citrate SOLN, Take 1 Bottle by mouth once., Disp: , Rfl:  .  metFORMIN (GLUCOPHAGE-XR) 500 MG 24 hr tablet, Take 1,000 mg by mouth in the morning and at bedtime. PT TAKES 2000 MG DAILY, Disp: , Rfl:  .  MONOJECT INSULIN SYRINGE 31G X 5/16" 1 ML MISC, See admin instructions., Disp: , Rfl:  .  mupirocin ointment (BACTROBAN) 2 %, Apply 1 application topically 2 (two) times daily., Disp: 30 g, Rfl: 2 .  naproxen (NAPROSYN) 250 MG tablet, Take 1 tablet by mouth in the morning and at  bedtime., Disp: , Rfl:  .  nystatin cream (MYCOSTATIN), 1 APPLICATION TWICE A DAY EXTERNALLY, Disp: , Rfl:  .  ONETOUCH VERIO test strip, , Disp: , Rfl:  .  pravastatin (PRAVACHOL) 40 MG tablet, Take 1 tablet once a day for cholesterol, Disp: , Rfl:  .  SODIUM CHLORIDE, EXTERNAL, (SALINE WOUND [place]) 0.9 % SOLN, Cleanse left foot ulcer with saline once daily., Disp: 210 mL, Rfl: 0 .  zinc sulfate 220 (50 Zn) MG capsule, Take 220 mg by mouth daily., Disp: , Rfl:   Social History   Tobacco Use  Smoking Status Never Smoker  Smokeless Tobacco Never Used    Allergies  Allergen Reactions  . Ciprofloxacin Other (See Comments)    Unknown  . Penicillins Other (See Comments)    Unknown  . Sulfa Antibiotics   . Sulfamethoxazole Other (See  Comments)    Unknown   Objective:  There were no vitals filed for this visit. There is no height or weight on file to calculate BMI. Constitutional Well developed. Well nourished.  Vascular Dorsalis pedis pulses palpable bilaterally. Posterior tibial pulses palpable bilaterally. Capillary refill normal to all digits.  No cyanosis or clubbing noted. Pedal hair growth normal.  Neurologic Normal speech. Oriented to person, place, and time. Protective sensation absent  Dermatologic Wound Location: Left submetatarsal 1 ulceration limited to the breakdown of the skin Wound Base: Mixed Granular/Fibrotic Peri-wound: Calloused Exudate: None: wound tissue dry Wound Measurements: -See below  Orthopedic: No pain to palpation either foot.   Radiographs: None Assessment:   1. Diabetic ulcer of left foot associated with diabetes mellitus due to underlying condition, limited to breakdown of skin, unspecified part of foot ([place])   2. Type 2 diabetes mellitus with hyperglycemia, with long-term current use of insulin ([place])    Plan:  Patient was evaluated and treated and all questions answered.  Ulcer left submet 1 ulceration limited to the breakdown of the skin~stagnant -Debridement as below. -Dressed with triple antibiotic and a Band-Aid, DSD. -Continue off-loading with surgical shoe.  Procedure: Excisional Debridement of Wound~stagnant Tool: Sharp chisel blade/tissue nipper Rationale: Removal of non-viable soft tissue from the wound to promote healing.  Anesthesia: none Pre-Debridement Wound Measurements: 1 cm x 0.7 cm x 0.2 cm Post-Debridement Wound Measurements: 1 cm x 0.8 cm x 0.3 cm Type of Debridement: Sharp Excisional Tissue Removed: Non-viable soft tissue Blood loss: Minimal (<50cc) Depth of Debridement: subcutaneous tissue. Technique: Sharp excisional debridement to bleeding, viable wound base.  Wound Progress: Compared to the previous wound measurement it appears to be  stagnant. Site healing conversation 7 Dressing: Dry, sterile, compression dressing. Disposition: Patient tolerated procedure well. Patient to return in 1 week for follow-up.  No follow-ups on file.

## 2019-10-16 DIAGNOSIS — I10 Essential (primary) hypertension: Secondary | ICD-10-CM | POA: Diagnosis not present

## 2019-10-20 DIAGNOSIS — A4902 Methicillin resistant Staphylococcus aureus infection, unspecified site: Secondary | ICD-10-CM

## 2019-10-20 HISTORY — DX: Methicillin resistant Staphylococcus aureus infection, unspecified site: A49.02

## 2019-10-22 ENCOUNTER — Other Ambulatory Visit: Payer: Self-pay

## 2019-10-22 ENCOUNTER — Ambulatory Visit (INDEPENDENT_AMBULATORY_CARE_PROVIDER_SITE_OTHER): Payer: PPO | Admitting: Podiatry

## 2019-10-22 DIAGNOSIS — E08621 Diabetes mellitus due to underlying condition with foot ulcer: Secondary | ICD-10-CM | POA: Diagnosis not present

## 2019-10-22 DIAGNOSIS — L03116 Cellulitis of left lower limb: Secondary | ICD-10-CM

## 2019-10-22 DIAGNOSIS — Z794 Long term (current) use of insulin: Secondary | ICD-10-CM | POA: Diagnosis not present

## 2019-10-22 DIAGNOSIS — E1165 Type 2 diabetes mellitus with hyperglycemia: Secondary | ICD-10-CM

## 2019-10-22 DIAGNOSIS — L97521 Non-pressure chronic ulcer of other part of left foot limited to breakdown of skin: Secondary | ICD-10-CM | POA: Diagnosis not present

## 2019-10-22 MED ORDER — DOXYCYCLINE HYCLATE 100 MG PO TABS: 100.0000 mg | ORAL_TABLET | Freq: Two times a day (BID) | ORAL | 0 refills | Status: DC

## 2019-10-25 ENCOUNTER — Other Ambulatory Visit: Payer: Self-pay | Admitting: Podiatry

## 2019-10-25 LAB — WOUND CULTURE
MICRO NUMBER:: 10781468
SPECIMEN QUALITY:: ADEQUATE

## 2019-10-25 MED ORDER — MUPIROCIN 2 % EX OINT: 1.0000 "application " | TOPICAL_OINTMENT | Freq: Two times a day (BID) | CUTANEOUS | 2 refills | Status: DC

## 2019-10-25 NOTE — Progress Notes (Signed)
Subjective: 69 year old female presents the office today for follow-up evaluation of an ulcer to the left foot.  She states that she has been on her foot quite a bit recently working on cleaning out her parents garage.  Fibular feel that she noticed the wound is worsening she started developed redness.  She came back from [place] to have this evaluated.  Denies any fevers, chills, nausea and vomiting.  No calf pain, chest pain, shortness of breath.  She states there is been some bloody drainage as the swollen.  No pus.  She is asked not limited to wound care center.   Objective: AAO x3, NAD DP/PT pulses palpable bilaterally, CRT less than 3 seconds Ulceration present on the left foot submetatarsal 1.  There is hyperkeratotic periwound.  There is surrounding erythema extending to the dorsal first MPJ with no further ascending cellulitis is notified.  Bloody drainage is present there is no purulence.  There is no areas of fluctuation crepitation.  The wound today measures 1.8 x 1 x 0.1 cm. Significant bunion is present No pain with calf compression, swelling, warmth, erythema  Assessment: Worsening ulceration right foot with cellulitis likely due to increased activity level  Plan: -All treatment options discussed with the patient including all alternatives, risks, complications.  -Debrided the wound today utilizing the 312 with scalpel without any complications none healthy, viable, granular tissue.  Wound cultures obtained today.  Prescribe doxycycline and Bactrim daily dressing to the mupirocin ointment.  Continue offloading peer encourage elevation of the foot.   -Referral to wound care center placed.  Return in about 1 week (around 10/29/2019).  Trula Slade DPM

## 2019-10-28 ENCOUNTER — Other Ambulatory Visit: Payer: Self-pay

## 2019-10-28 ENCOUNTER — Ambulatory Visit: Payer: PPO | Admitting: Podiatry

## 2019-10-28 ENCOUNTER — Telehealth: Payer: Self-pay | Admitting: *Deleted

## 2019-10-28 DIAGNOSIS — E1165 Type 2 diabetes mellitus with hyperglycemia: Secondary | ICD-10-CM | POA: Diagnosis not present

## 2019-10-28 DIAGNOSIS — Z794 Long term (current) use of insulin: Secondary | ICD-10-CM | POA: Diagnosis not present

## 2019-10-28 DIAGNOSIS — Z22322 Carrier or suspected carrier of Methicillin resistant Staphylococcus aureus: Secondary | ICD-10-CM

## 2019-10-28 DIAGNOSIS — E08621 Diabetes mellitus due to underlying condition with foot ulcer: Secondary | ICD-10-CM

## 2019-10-28 DIAGNOSIS — L03116 Cellulitis of left lower limb: Secondary | ICD-10-CM | POA: Diagnosis not present

## 2019-10-28 DIAGNOSIS — L97521 Non-pressure chronic ulcer of other part of left foot limited to breakdown of skin: Secondary | ICD-10-CM | POA: Diagnosis not present

## 2019-10-28 MED ORDER — DOXYCYCLINE HYCLATE 100 MG PO TABS: 100.0000 mg | ORAL_TABLET | Freq: Two times a day (BID) | ORAL | 0 refills | Status: DC

## 2019-10-28 NOTE — Telephone Encounter (Signed)
Faxed required form, clinicals and demographics to [facility]. 

## 2019-10-28 NOTE — Telephone Encounter (Signed)
-----   Message from Trula Slade, DPM sent at 10/25/2019  3:54 PM EDT ----- Can you please refer to the wound care center? Note is complete.

## 2019-10-29 DIAGNOSIS — Z22322 Carrier or suspected carrier of Methicillin resistant Staphylococcus aureus: Secondary | ICD-10-CM | POA: Insufficient documentation

## 2019-10-29 NOTE — Progress Notes (Signed)
Subjective: 69 year old female presents the office today for follow-up evaluation of an ulcer to the left foot.  She is on antibiotics and she states that the redness is improved but she thinks the wound may be about the same.  Denies any drainage or pus.  No swelling otherwise.  She denies any systemic concerns including fevers, chills, nausea, vomiting.  No calf pain, chest pain, shortness of breath.  She has an appointment with wound care center but is scheduled at 1 month and is on the cancellation list.  Objective: AAO x3, NAD DP/PT pulses palpable bilaterally, CRT less than 3 seconds Ulceration present on the left foot submetatarsal 1.  Mild hyperkeratotic periwound.  The erythema appears to be much improved and there is no ascending cellulitis.  No areas of fluctuation or crepitation.  There is no malodor.  The wound today measures 1.5 x 0.8 x 0.1 cm.  The wound base is granular.  Significant bunion is present No pain with calf compression, swelling, warmth, erythema  Assessment: Ulceration right foot, MRSA  Plan: -All treatment options discussed with the patient including all alternatives, risks, complications.  -Debrided the wound today utilizing the 312 with scalpel without any complications none healthy, viable, bleeding, granular tissue.  We will continue doxycycline for now.  Continue daily dressing changes with Medihoney.  Offloading at all times. -Monitor for any clinical signs or symptoms of infection and directed to call the office immediately should any occur or go to the ER.  Return in about 1 week (around 11/04/2019).  Trula Slade DPM

## 2019-11-04 ENCOUNTER — Other Ambulatory Visit: Payer: Self-pay

## 2019-11-04 ENCOUNTER — Encounter: Payer: Self-pay | Admitting: Podiatry

## 2019-11-04 ENCOUNTER — Ambulatory Visit: Payer: PPO | Admitting: Neurology

## 2019-11-04 ENCOUNTER — Encounter: Payer: Self-pay | Admitting: Neurology

## 2019-11-04 ENCOUNTER — Ambulatory Visit: Payer: PPO | Admitting: Podiatry

## 2019-11-04 VITALS — BP 101/62 | HR 68 | Ht 69.0 in | Wt 258.0 lb

## 2019-11-04 DIAGNOSIS — E1165 Type 2 diabetes mellitus with hyperglycemia: Secondary | ICD-10-CM | POA: Diagnosis not present

## 2019-11-04 DIAGNOSIS — Z8673 Personal history of transient ischemic attack (TIA), and cerebral infarction without residual deficits: Secondary | ICD-10-CM | POA: Diagnosis not present

## 2019-11-04 DIAGNOSIS — E08621 Diabetes mellitus due to underlying condition with foot ulcer: Secondary | ICD-10-CM | POA: Diagnosis not present

## 2019-11-04 DIAGNOSIS — Z794 Long term (current) use of insulin: Secondary | ICD-10-CM

## 2019-11-04 DIAGNOSIS — L97521 Non-pressure chronic ulcer of other part of left foot limited to breakdown of skin: Secondary | ICD-10-CM

## 2019-11-04 DIAGNOSIS — R413 Other amnesia: Secondary | ICD-10-CM

## 2019-11-04 NOTE — Progress Notes (Signed)
NEUROLOGY FOLLOW UP OFFICE NOTE  [name] [id number] [birth year]  HISTORY OF PRESENT ILLNESS: I had the pleasure of seeing [name] in follow-up in the neurology clinic on 11/04/2019.  The patient was last seen 6 months ago for memory changes. Records and images were personally reviewed where available. I personally reviewed MRI brain without contrast done 05/2019 which did not show any acute changes. There was a small chronic infarct in the left parietal lobe, tiny chronic subcortical/cortical infarcts in both frontal lobes, mild chronic microvascular disease, mild diffuse atrophy. She underwent Neuropsychological testing in 06/2019 which was within normal limits. There was an isolated weakness in pattern recognition and adaptability, however performance across all other executive functioning assessments was appropriate. Profile showed no indication of a neurodegenerative illness. It was noted she had OSA that was untreated (not using CPAP) and had anxiety, which could affect cognition. She contacted our office about an episode of visual hallucinations in May, she thought she was seeing trees on her dining room wall, the room was dark though lights were on. It felt like she was moving in a vehicle. No loss of consciousness. She had an EEG in 07/2019 which was normal. She was advised to have an eye exam and was told it was overall fine, she will need cataract surgery at some point.  Since her last visit, she reports overall doing fine. She denies any further hallucinations since May. She second guessed herself driving here, reporting she sees so many doctors and was a little panicky if she was going the right way, but otherwise she denies any driving issues. She forget her medications this morning because she was rushing, but this is infrequent. She has started a daily baby aspirin for secondary stroke prevention. Glucose levels are better. She closely monitors her BP and reports it is low today,  she feels she needs to increase water intake and felt lightheaded this morning. No falls. She was reporting headaches and was started on low dose amitriptyline, but stopped it due to drowsiness. She has lost weight and exercises regularly.    History on Initial Assessment 05/03/2019: This is a 69 year old right-handed woman with a history of hypertension, hyperlipidemia, Takotsubo cardiomyopathy, diabetes, sleep apnea, neuropathy, presenting for evaluation of memory loss. She feels memory changes started after she retired in 2018. She states her life completely changes, she was tutoring and working as a Oceanographer, then the pandemic happened and she was not as active. She noticed a big change in the past year in how much she has to think of things more. She likes to do puzzles but has not been able to do it much. She lives alone. She stopped driving after her fall in November where she injured her knees. Her medications come in pillpacks but she still forgets 1-2 times a month. She denies missing bill payments, most of them are on autopay. She misplaces things frequently, she could not remember where she filed the appointment form for today's visit. She would reach for the fridge to get ice, or vice versa. Her family has noticed she does not always spell things correctly when she had always been good with spelling. Her handwriting has gotten worse. Both parents had Alzheimer's disease. No alcohol use. Her mother passed away in 16-Nov-2018 and she was helping her sister clean out the house in November 2020. She was standing on a stoop and felt herself going backward after her sister passed her a bag, and she  had a fall that was more than 90 degrees down. She hit her head hard, no loss of consciousness. She went to [facility] in [place] where she had a head CT reporting a hypoattenuating focus within the left posterior parietal/occipital cortex, favored to be related to encephalomalacia. Correlate for  history of prior hemorrhage or infarct. Additional small focus is seen in the left frontal lobe. She denies any prior history of head injuries or prior stroke.  A few weeks after the fall, she started having occasional headaches, that became more constant in January, occurring 1-2 times a week. Pain is in the right temporal region, in her jaw, over the right eye, frontal region. There is an uncomfortable pressure. They come and go, usually at night when she is laying down flat. She does not notice them as much when standing. There was some tenderness on the right temporal region, applying Aspercreme with lidocaine helps. No pain when chewing. There is no nausea/vomiting, vision changes but she has noticed she is more sensitive to lights and sounds. She had neck pain after the fall, it still feels uncomfortable. She has chronic back pain. She had dizziness initially, this has resolved. She injured her knee and was taking Naproxen. She has neuropathy and loses her balance easily. There is numbness up to the mid-calf region, her hands are starting to get affecting along the fingertips. She has noticed more tremors in her hands. She has urinary frequency and has been getting up at 3am to urinate for the past 3-4 weeks. When asked about mood, she says she has her moments, her medications were changed with her mother's passing. She became tearful talking about their deaths. She is on Bupropion and Prozac. She states her HbA1c went down in January from a 7.5 to 7. She was supposed to take aspirin when diagnosed with cardiomyopathy but stopped it because she was taking too much medications.   PAST MEDICAL HISTORY: Past Medical History:  Diagnosis Date  . CKD (chronic kidney disease) 06/07/2016   Stage 2, GFR 60-89 ml/min  . Diabetic polyneuropathy 06/07/2016  . Dyslipidemia   . Endometrial adenocarcinoma   . Esophageal reflux   . Fever blister   . Hyperlipidemia   . Hypertension, essential 06/07/2016  . Major  depressive disorder   . Mixed dyslipidemia 06/07/2016  . Obstructive sleep apnea 06/07/2016  . Peripheral neuropathy   . Retinopathy   . Severe obesity   . Skin ulcer of left foot with fat layer exposed 11/02/2016  . Stroke    Asymptomatic, discovered via neuroimaging; small infarct in left parietal lobe; also concern for small b/l frontal infarcts  . Tachycardia 07/19/2017  . Type 2 diabetes mellitus with hyperglycemia, with long-term current use of insulin 06/07/2016  . Unilateral primary osteoarthritis, right knee 04/16/2019    MEDICATIONS: Current Outpatient Medications on File Prior to Visit  Medication Sig Dispense Refill  . amitriptyline (ELAVIL) 10 MG tablet Take 1 tablet (10 mg total) by mouth at bedtime. 20 tablet 0  . Ascorbic Acid (VITAMIN C) 100 MG tablet Take 100 mg by mouth daily.    Marland Kitchen buPROPion (WELLBUTRIN XL) 300 MG 24 hr tablet Take 300 mg by mouth daily.    . carvedilol (COREG) 6.25 MG tablet Take 1 tablet (6.25 mg total) by mouth 2 (two) times daily. 180 tablet 1  . Cholecalciferol (VITAMIN D3) 1.25 MG (50000 UT) TABS Take by mouth.    . doxycycline (VIBRA-TABS) 100 MG tablet Take 1 tablet (100 mg  total) by mouth 2 (two) times daily. 20 tablet 0  . FLUoxetine (PROZAC) 40 MG capsule Take 40 mg by mouth daily.   0  . furosemide (LASIX) 40 MG tablet Take 1 tablet (40 mg total) by mouth daily. 90 tablet 3  . insulin NPH-regular Human (NOVOLIN 70/30) (70-30) 100 UNIT/ML injection Inject into the skin. 24 ml in the morning and 20 ml in the evening    . Inulin (FIBER CHOICE PO) Take by mouth.    . Lancets (ONETOUCH DELICA PLUS [id number]) MISC USE AS DIRECTED 3 TIMES A DAY    . losartan (COZAAR) 100 MG tablet Take 100 mg by mouth daily.     . magnesium citrate SOLN Take 1 Bottle by mouth once.    . metFORMIN (GLUCOPHAGE-XR) 500 MG 24 hr tablet Take 1,000 mg by mouth in the morning and at bedtime. PT TAKES 2000 MG DAILY    . MONOJECT INSULIN SYRINGE 31G X 5/16" 1 ML MISC See admin  instructions.    . mupirocin ointment (BACTROBAN) 2 % Apply 1 application topically 2 (two) times daily. 30 g 2  . naproxen (NAPROSYN) 250 MG tablet Take 1 tablet by mouth in the morning and at bedtime.    Marland Kitchen nystatin cream (MYCOSTATIN) 1 APPLICATION TWICE A DAY EXTERNALLY    . ONETOUCH VERIO test strip     . pravastatin (PRAVACHOL) 40 MG tablet Take 1 tablet once a day for cholesterol    . SODIUM CHLORIDE, EXTERNAL, (SALINE WOUND [place]) 0.9 % SOLN Cleanse left foot ulcer with saline once daily. 210 mL 0  . zinc sulfate 220 (50 Zn) MG capsule Take 220 mg by mouth daily.     No current facility-administered medications on file prior to visit.    ALLERGIES: Allergies  Allergen Reactions  . Ciprofloxacin Other (See Comments)    Unknown  . Penicillins Other (See Comments)    Unknown  . Sulfa Antibiotics   . Sulfamethoxazole Other (See Comments)    Unknown    FAMILY HISTORY: Family History  Problem Relation Age of Onset  . Hypertension Mother   . Alzheimer's disease Mother   . Heart attack Father   . Cancer Father   . CAD Father   . Alzheimer's disease Father   . Diverticulitis Sister   . Obesity Sister   . Hypertension Sister   . Voice disorder Brother     SOCIAL HISTORY: Social History   Socioeconomic History  . Marital status: Single    Spouse name: Not on file  . Number of children: Not on file  . Years of education: 4  . Highest education level: Bachelor's degree (e.g., BA, AB, BS)  Occupational History  . Occupation: Retired    Comment: Pharmacist, hospital  Tobacco Use  . Smoking status: Never Smoker  . Smokeless tobacco: Never Used  Substance and Sexual Activity  . Alcohol use: No  . Drug use: No  . Sexual activity: Not on file  Other Topics Concern  . Not on file  Social History Narrative  . Not on file   Social Determinants of Health   Financial Resource Strain:   . Difficulty of Paying Living Expenses:   Food Insecurity:   . Worried About Sales executive in the Last Year:   . Arboriculturist in the Last Year:   Transportation Needs:   . Film/video editor (Medical):   Marland Kitchen Lack of Transportation (Non-Medical):   Physical Activity:   .  Days of Exercise per Week:   . Minutes of Exercise per Session:   Stress:   . Feeling of Stress :   Social Connections:   . Frequency of Communication with Friends and Family:   . Frequency of Social Gatherings with Friends and Family:   . Attends Religious Services:   . Active Member of Clubs or Organizations:   . Attends Archivist Meetings:   Marland Kitchen Marital Status:   Intimate Partner Violence:   . Fear of Current or Ex-Partner:   . Emotionally Abused:   Marland Kitchen Physically Abused:   . Sexually Abused:      PHYSICAL EXAM: Vitals:   11/04/19 1134  BP: 101/62  Pulse: 68  SpO2: 97%   General: No acute distress Head:  Normocephalic/atraumatic Skin/Extremities: No rash, no edema, left foot in a boot Neurological Exam: alert and oriented to person, place, and time. No aphasia or dysarthria. Fund of knowledge is appropriate.  Recent and remote memory are intact.  Attention and concentration are normal. Cranial nerves: Pupils equal, round. Extraocular movements intact with no nystagmus. Visual fields full.No facial asymmetry. Motor: Bulk and tone normal, muscle strength 5/5 throughout except for 3/5 left hip flexion (weak since back surgery). Finger to nose testing intact.  Gait slow and cautious with boot in left foot, using cane. No ataxia  IMPRESSION: This is a 69 yo RH woman with a history of hypertension, hyperlipidemia, Takotsubo cardiomyopathy, diabetes, sleep apnea, neuropathy, who presented for evaluation of memory changes and abnormal head CT done due to a fall. She underwent several tests since last visit, MRI brain showed chronic left parietal infarct, she is asymptomatic from this, continue daily aspirin and control of vascular risk factors for secondary stroke prevention.  Neuropsychological testing did not show any evidence of a neurodegenerative process, we discussed how poorly controlled sleep apnea and anxiety/depression can affect memory, she was advised to speak to her sleep specialist about other options for OSA. Continue follow-up with PCP for anxiety/depression, consider counseling per Neuropsych recommendations. We discussed the importance of control of vascular risk factors, physical exercise, and brain stimulation exercises for brain health. She stopped the amitriptyline for headache prophylaxis due to side effects, and is not interested in starting a different medication. Follow-up as needed, she knows to call for any changes.    Thank you for allowing me to participate in her care.  Please do not hesitate to call for any questions or concerns.   Ellouise Newer, M.D.   CC: Dr. Dema Severin

## 2019-11-04 NOTE — Patient Instructions (Signed)
Good to see you! Consider seeing your sleep specialist again to discuss other options for sleep apnea. Follow-up as needed, call for any changes.    RECOMMENDATIONS FOR ALL PATIENTS WITH MEMORY PROBLEMS: 1. Continue to exercise (Recommend 30 minutes of walking everyday, or 3 hours every week) 2. Increase social interactions - continue going to [place] and enjoy social gatherings with friends and family 3. Eat healthy, avoid fried foods and eat more fruits and vegetables 4. Maintain adequate blood pressure, blood sugar, and blood cholesterol level. Reducing the risk of stroke and cardiovascular disease also helps promoting better memory. 5. Avoid stressful situations. Live a simple life and avoid aggravations. Organize your time and prepare for the next day in anticipation. 6. Sleep well, avoid any interruptions of sleep and avoid any distractions in the bedroom that may interfere with adequate sleep quality 7. Avoid sugar, avoid sweets as there is a strong link between excessive sugar intake, diabetes, and cognitive impairment The Mediterranean diet has been shown to help patients reduce the risk of progressive memory disorders and reduces cardiovascular risk. This includes eating fish, eat fruits and green leafy vegetables, nuts like almonds and hazelnuts, walnuts, and also use olive oil. Avoid fast foods and fried foods as much as possible. Avoid sweets and sugar as sugar use has been linked to worsening of memory function.

## 2019-11-04 NOTE — Progress Notes (Signed)
Subjective: 69 year old female presents the office today for follow-up evaluation of an ulcer to the left foot. She thinks that the wound is doing better. She has been applying medihoney to the wound.  Minimal drainage but no pus.  No redness or red streaks that she can recall.  No pain.She denies any systemic concerns including fevers, chills, nausea, vomiting.  No calf pain, chest pain, shortness of breath.   Objective: AAO x3, NAD DP/PT pulses palpable bilaterally, CRT less than 3 seconds Ulceration present on the left foot submetatarsal 1.  Hyperkeratotic periwound.  After debridement with measures 1 x 0.5 x 0.1 cm.  After debridement wound base is granular.  There is no surrounding erythema, ascending cellulitis and no fluctuation crepitation there is no malodor. Significant bunion is present No pain with calf compression, swelling, warmth, erythema  Assessment: Ulceration right foot, MRSA- improving  Plan: -All treatment options discussed with the patient including all alternatives, risks, complications.  -Debrided the wound today utilizing the 312 with scalpel without any complications none healthy, viable, bleeding, granular tissue.  Finish course of doxycyline.  Continue daily dressing changes with Medihoney.  Offloading at all times. -Monitor for any clinical signs or symptoms of infection and directed to call the office immediately should any occur or go to the ER.  No follow-ups on file.  Trula Slade DPM

## 2019-11-08 DIAGNOSIS — E1165 Type 2 diabetes mellitus with hyperglycemia: Secondary | ICD-10-CM | POA: Diagnosis not present

## 2019-11-08 DIAGNOSIS — F325 Major depressive disorder, single episode, in full remission: Secondary | ICD-10-CM | POA: Diagnosis not present

## 2019-11-08 DIAGNOSIS — I10 Essential (primary) hypertension: Secondary | ICD-10-CM | POA: Diagnosis not present

## 2019-11-08 DIAGNOSIS — E1140 Type 2 diabetes mellitus with diabetic neuropathy, unspecified: Secondary | ICD-10-CM | POA: Diagnosis not present

## 2019-11-08 DIAGNOSIS — I2510 Atherosclerotic heart disease of native coronary artery without angina pectoris: Secondary | ICD-10-CM | POA: Diagnosis not present

## 2019-11-08 DIAGNOSIS — E785 Hyperlipidemia, unspecified: Secondary | ICD-10-CM | POA: Diagnosis not present

## 2019-11-08 DIAGNOSIS — F3341 Major depressive disorder, recurrent, in partial remission: Secondary | ICD-10-CM | POA: Diagnosis not present

## 2019-11-08 DIAGNOSIS — Z1231 Encounter for screening mammogram for malignant neoplasm of breast: Secondary | ICD-10-CM | POA: Diagnosis not present

## 2019-11-08 DIAGNOSIS — N183 Chronic kidney disease, stage 3 unspecified: Secondary | ICD-10-CM | POA: Diagnosis not present

## 2019-11-08 DIAGNOSIS — M47812 Spondylosis without myelopathy or radiculopathy, cervical region: Secondary | ICD-10-CM | POA: Diagnosis not present

## 2019-11-08 DIAGNOSIS — I129 Hypertensive chronic kidney disease with stage 1 through stage 4 chronic kidney disease, or unspecified chronic kidney disease: Secondary | ICD-10-CM | POA: Diagnosis not present

## 2019-11-08 DIAGNOSIS — E11319 Type 2 diabetes mellitus with unspecified diabetic retinopathy without macular edema: Secondary | ICD-10-CM | POA: Diagnosis not present

## 2019-11-12 ENCOUNTER — Encounter (HOSPITAL_BASED_OUTPATIENT_CLINIC_OR_DEPARTMENT_OTHER): Payer: PPO | Attending: Internal Medicine | Admitting: Internal Medicine

## 2019-11-12 DIAGNOSIS — I129 Hypertensive chronic kidney disease with stage 1 through stage 4 chronic kidney disease, or unspecified chronic kidney disease: Secondary | ICD-10-CM | POA: Insufficient documentation

## 2019-11-12 DIAGNOSIS — L97522 Non-pressure chronic ulcer of other part of left foot with fat layer exposed: Secondary | ICD-10-CM | POA: Diagnosis not present

## 2019-11-12 DIAGNOSIS — E11621 Type 2 diabetes mellitus with foot ulcer: Secondary | ICD-10-CM | POA: Insufficient documentation

## 2019-11-12 DIAGNOSIS — F329 Major depressive disorder, single episode, unspecified: Secondary | ICD-10-CM | POA: Insufficient documentation

## 2019-11-12 DIAGNOSIS — E1142 Type 2 diabetes mellitus with diabetic polyneuropathy: Secondary | ICD-10-CM | POA: Diagnosis not present

## 2019-11-12 DIAGNOSIS — Z8673 Personal history of transient ischemic attack (TIA), and cerebral infarction without residual deficits: Secondary | ICD-10-CM | POA: Diagnosis not present

## 2019-11-12 DIAGNOSIS — N183 Chronic kidney disease, stage 3 unspecified: Secondary | ICD-10-CM | POA: Diagnosis not present

## 2019-11-12 DIAGNOSIS — E1122 Type 2 diabetes mellitus with diabetic chronic kidney disease: Secondary | ICD-10-CM | POA: Diagnosis not present

## 2019-11-13 NOTE — Progress Notes (Signed)
THEONE, BOWELL ([id number]) Visit Report for 11/12/2019 Abuse/Suicide Risk Screen Details Patient Name: Date of Service: Holly Alar A. 11/12/2019 10:30 A M Medical Record Number: [id number] Patient Account Number: [id number] Date of Birth/Sex: Treating RN: 03-12-1951 (69 y.o. [name] Primary Care Mirinda Monte: [name] Other Clinician: Referring Jadarion Halbig: Treating Takisha Pelle/Extender: Wonda Horner in Treatment: 0 Abuse/Suicide Risk Screen Items Answer ABUSE RISK SCREEN: Has anyone close to you tried to hurt or harm you recentlyo No Do you feel uncomfortable with anyone in your familyo No Has anyone forced you do things that you didnt want to doo No Electronic Signature(s) Signed: 11/12/2019 5:09:36 PM By: Baruch Gouty RN, BSN Entered By: Baruch Gouty on 11/12/2019 10:48:15 -------------------------------------------------------------------------------- Activities of Daily Living Details Patient Name: Date of Service: Holly Alar A. 11/12/2019 10:30 A M Medical Record Number: [id number] Patient Account Number: [id number] Date of Birth/Sex: Treating RN: 1950/09/19 (69 y.o. [name] Primary Care Davine Sweney: [name] Other Clinician: Referring Cydnie Deason: Treating Aristides Luckey/Extender: Wonda Horner in Treatment: 0 Activities of Daily Living Items Answer Activities of Daily Living (Please select one for each item) Drive Automobile Completely Able T Medications ake Completely Able Use T elephone Completely Able Care for Appearance Completely Able Use T oilet Completely Able Bath / Shower Completely Able Dress Self Completely Able Feed Self Completely Able Walk Completely Able Get In / Out Bed Completely Able Housework Completely Able Prepare Meals Completely Able Handle Money Completely Able Shop for Self Completely Able Electronic Signature(s) Signed: 11/12/2019 5:09:36 PM By: Baruch Gouty RN, BSN Entered By: Baruch Gouty on 11/12/2019 10:48:34 -------------------------------------------------------------------------------- Education Screening Details Patient Name: Date of Service: [name], Holly Dess RA H A. 11/12/2019 10:30 A M Medical Record Number: [id number] Patient Account Number: [id number] Date of Birth/Sex: Treating RN: [birth year] (69 y.o. [name] Primary Care Joell Usman: [name] Other Clinician: Referring Seher Schlagel: Treating Ronasia Isola/Extender: Wonda Horner in Treatment: 0 Primary Learner Assessed: Patient Learning Preferences/Education Level/Primary Language Learning Preference: Explanation, Demonstration, Printed Material Highest Education Level: College or Above Preferred Language: English Cognitive Barrier Language Barrier: No Translator Needed: No Memory Deficit: No Emotional Barrier: No Cultural/Religious Beliefs Affecting Medical Care: No Physical Barrier Impaired Vision: Yes Glasses Impaired Hearing: No Decreased Hand dexterity: No Knowledge/Comprehension Knowledge Level: High Comprehension Level: High Ability to understand written instructions: High Ability to understand verbal instructions: High Motivation Anxiety Level: Calm Cooperation: Cooperative Education Importance: Acknowledges Need Interest in Health Problems: Asks Questions Perception: Coherent Willingness to Engage in Self-Management High Activities: Readiness to Engage in Self-Management High Activities: Electronic Signature(s) Signed: 11/12/2019 5:09:36 PM By: Baruch Gouty RN, BSN Entered By: Baruch Gouty on 11/12/2019 10:49:08 -------------------------------------------------------------------------------- Fall Risk Assessment Details Patient Name: Date of Service: [name], Holly RA H A. 11/12/2019 10:30 A M Medical Record Number: [id number] Patient Account Number: [id number] Date of Birth/Sex: Treating RN: February 09, 1951 (69  y.o. [name] Primary Care Salli Bodin: [name] Other Clinician: Referring Meriem Lemieux: Treating Joelle Flessner/Extender: Wonda Horner in Treatment: 0 Fall Risk Assessment Items Have you had 2 or more falls in the last 12 monthso 0 Yes Have you had any fall that resulted in injury in the last 12 monthso 0 Yes FALLS RISK SCREEN History of falling - immediate or within 3 months 25 Yes Secondary diagnosis (Do you have 2 or more medical diagnoseso) 0 No Ambulatory aid None/bed rest/wheelchair/nurse 0 No Crutches/cane/walker 15 Yes Furniture 0 No Intravenous therapy Access/Saline/Heparin  Lock 0 No Gait/Transferring Normal/ bed rest/ wheelchair 0 Yes Weak (short steps with or without shuffle, stooped but able to lift head while walking, may seek 0 No support from furniture) Impaired (short steps with shuffle, may have difficulty arising from chair, head down, impaired 0 No balance) Mental Status Oriented to own ability 0 Yes Electronic Signature(s) Signed: 11/12/2019 5:09:36 PM By: Baruch Gouty RN, BSN Entered By: Baruch Gouty on 11/12/2019 10:50:24 -------------------------------------------------------------------------------- Foot Assessment Details Patient Name: Date of Service: [name], Holly Dess RA H A. 11/12/2019 10:30 A M Medical Record Number: [id number] Patient Account Number: [id number] Date of Birth/Sex: Treating RN: [birth year] (69 y.o. [name] Primary Care Trystian Crisanto: [name] Other Clinician: Referring Jabarie Pop: Treating Zaden Sako/Extender: Wonda Horner in Treatment: 0 Foot Assessment Items Site Locations + = Sensation present, - = Sensation absent, C = Callus, U = Ulcer R = Redness, W = Warmth, M = Maceration, PU = Pre-ulcerative lesion F = Fissure, S = Swelling, D = Dryness Assessment Right: Left: Other Deformity: No Yes Prior Foot Ulcer: Yes Yes Prior Amputation: No Yes Charcot Joint: No  No Ambulatory Status: Ambulatory Without Help Gait: Steady Electronic Signature(s) Signed: 11/12/2019 5:09:36 PM By: Baruch Gouty RN, BSN Entered By: Baruch Gouty on 11/12/2019 10:52:59 -------------------------------------------------------------------------------- Nutrition Risk Screening Details Patient Name: Date of Service: Holly Conroy RA H A. 11/12/2019 10:30 A M Medical Record Number: [id number] Patient Account Number: [id number] Date of Birth/Sex: Treating RN: 1950-07-15 (69 y.o. [name] Primary Care Meoshia Billing: [name] Other Clinician: Referring Tamyra Fojtik: Treating Latron Ribas/Extender: Wonda Horner in Treatment: 0 Height (in): 69 Weight (lbs): 258 Body Mass Index (BMI): 38.1 Nutrition Risk Screening Items Score Screening NUTRITION RISK SCREEN: I have an illness or condition that made me change the kind and/or amount of food I eat 0 No I eat fewer than two meals per day 0 No I eat few fruits and vegetables, or milk products 0 No I have three or more drinks of beer, liquor or wine almost every day 0 No I have tooth or mouth problems that make it hard for me to eat 0 No I don't always have enough money to buy the food I need 0 No I eat alone most of the time 1 Yes I take three or more different prescribed or over-the-counter drugs a day 1 Yes Without wanting to, I have lost or gained 10 pounds in the last six months 0 No I am not always physically able to shop, cook and/or feed myself 0 No Nutrition Protocols Good Risk Protocol 0 No interventions needed Moderate Risk Protocol High Risk Proctocol Risk Level: Good Risk Score: 2 Electronic Signature(s) Signed: 11/12/2019 5:09:36 PM By: Baruch Gouty RN, BSN Entered By: Baruch Gouty on 11/12/2019 10:51:21

## 2019-11-14 ENCOUNTER — Ambulatory Visit: Payer: PPO | Admitting: Podiatry

## 2019-11-18 DIAGNOSIS — I129 Hypertensive chronic kidney disease with stage 1 through stage 4 chronic kidney disease, or unspecified chronic kidney disease: Secondary | ICD-10-CM | POA: Diagnosis not present

## 2019-11-19 ENCOUNTER — Encounter (HOSPITAL_BASED_OUTPATIENT_CLINIC_OR_DEPARTMENT_OTHER): Payer: PPO | Admitting: Internal Medicine

## 2019-11-19 DIAGNOSIS — L97522 Non-pressure chronic ulcer of other part of left foot with fat layer exposed: Secondary | ICD-10-CM | POA: Diagnosis not present

## 2019-11-19 DIAGNOSIS — R928 Other abnormal and inconclusive findings on diagnostic imaging of breast: Secondary | ICD-10-CM | POA: Diagnosis not present

## 2019-11-19 DIAGNOSIS — E11621 Type 2 diabetes mellitus with foot ulcer: Secondary | ICD-10-CM | POA: Diagnosis not present

## 2019-11-20 NOTE — Progress Notes (Signed)
TREA, CARNEGIE ([id number]) Visit Report for 11/19/2019 Arrival Information Details Patient Name: Date of Service: [name] 11/19/2019 1:00 PM Medical Record Number: [id number] Patient Account Number: [id number] Date of Birth/Sex: Treating RN: 09/29/1950 (69 y.o. Hollie Salk, Larene Beach Primary Care Kelicia Youtz: [name] Other Clinician: Referring Avantika Shere: Treating Shaneal Barasch/Extender: Wonda Horner in Treatment: 1 Visit Information History Since Last Visit Added or deleted any medications: No Patient Arrived: [name] Any new allergies or adverse reactions: No Arrival Time: 13:10 Had a fall or experienced change in No Accompanied By: self activities of daily living that may affect Transfer Assistance: None risk of falls: Patient Identification Verified: Yes Signs or symptoms of abuse/neglect since last visito No Secondary Verification Process Completed: Yes Hospitalized since last visit: No Patient Requires Transmission-Based Precautions: No Implantable device outside of the clinic excluding No Patient Has Alerts: No cellular tissue based products placed in the center since last visit: Has Dressing in Place as Prescribed: Yes Pain Present Now: No Electronic Signature(s) Signed: 11/19/2019 5:00:49 PM By: Kela Millin Entered By: Kela Millin on 11/19/2019 13:14:18 -------------------------------------------------------------------------------- Encounter Discharge Information Details Patient Name: Date of Service: [name], [name] 11/19/2019 1:00 PM Medical Record Number: [id number] Patient Account Number: [id number] Date of Birth/Sex: Treating RN: [birth year] (69 y.o. Clearnce Sorrel Primary Care Reyanna Baley: [name] Other Clinician: Referring Lilymae Swiech: Treating Collyns Mcquigg/Extender: Wonda Horner in Treatment: 1 Encounter Discharge Information Items Post Procedure Vitals Discharge Condition:  Stable Temperature (F): 97.8 Ambulatory Status: Cane Pulse (bpm): 62 Discharge Destination: Home Respiratory Rate (breaths/min): 18 Transportation: Private Auto Blood Pressure (mmHg): 107/62 Accompanied By: self Schedule Follow-up Appointment: Yes Clinical Summary of Care: Patient Declined Electronic Signature(s) Signed: 11/19/2019 5:00:49 PM By: Kela Millin Entered By: Kela Millin on 11/19/2019 13:53:58 -------------------------------------------------------------------------------- Lower Extremity Assessment Details Patient Name: Date of Service: [name], [name] 11/19/2019 1:00 PM Medical Record Number: [id number] Patient Account Number: [id number] Date of Birth/Sex: Treating RN: 02-10-1951 (69 y.o. Clearnce Sorrel Primary Care Klaryssa Fauth: [name] Other Clinician: Referring Fredi Geiler: Treating Daphnie Venturini/Extender: Wonda Horner in Treatment: 1 Edema Assessment Assessed: [Left: No] [Right: No] Edema: [Left: Ye] [Right: s] Calf Left: Right: Point of Measurement: cm From Medial Instep 49 cm cm Ankle Left: Right: Point of Measurement: cm From Medial Instep 28.5 cm cm Vascular Assessment Pulses: Dorsalis Pedis Palpable: [Left:Yes] Electronic Signature(s) Signed: 11/19/2019 5:00:49 PM By: Kela Millin Entered By: Kela Millin on 11/19/2019 13:15:36 -------------------------------------------------------------------------------- Multi Wound Chart Details Patient Name: Date of Service: [name], [name] 11/19/2019 1:00 PM Medical Record Number: [id number] Patient Account Number: [id number] Date of Birth/Sex: Treating RN: 08/27/1950 (69 y.o. Orvan Falconer Primary Care Bettie Capistran: [name] Other Clinician: Referring Emali Heyward: Treating Priyansh Pry/Extender: Wonda Horner in Treatment: 1 Vital Signs Height(in): 51 Capillary Blood Glucose(mg/dl): 102 Weight(lbs): 258 Pulse(bpm): 82 Body  Mass Index(BMI): 75 Blood Pressure(mmHg): 107/62 Temperature(F): 97.8 Respiratory Rate(breaths/min): 18 Photos: [2:No Photos Left Metatarsal head first] [N/A:N/A N/A] Wound Location: [2:Gradually Appeared] [N/A:N/A] Wounding Event: [2:Diabetic Wound/Ulcer of the Lower] [N/A:N/A] Primary Etiology: [2:Extremity Cataracts, Sleep Apnea, Hypertension, N/A] Comorbid History: [2:Type II Diabetes, Osteoarthritis, Osteomyelitis, Neuropathy, Confinement Anxiety 06/20/2019] [N/A:N/A] Date Acquired: [2:1] [N/A:N/A] Weeks of Treatment: [2:Open] [N/A:N/A] Wound Status: [2:0.8x0.3x0.3] [N/A:N/A] Measurements L x W x D (cm) [2:0.188] [N/A:N/A] A (cm) : rea [2:0.057] [N/A:N/A] Volume (cm) : [2:70.10%] [N/A:N/A] % Reduction in A rea: [2:9.50%] [N/A:N/A] % Reduction in Volume: [2:11] Starting Position 1 (  o'clock): [2:3] Ending Position 1 (o'clock): [2:0.4] Maximum Distance 1 (cm): [2:Yes] [N/A:N/A] Undermining: [2:Grade 1] [N/A:N/A] Classification: [2:Small] [N/A:N/A] Exudate A mount: [2:Serosanguineous] [N/A:N/A] Exudate Type: [2:red, brown] [N/A:N/A] Exudate Color: [2:Thickened] [N/A:N/A] Wound Margin: [2:Large (67-100%)] [N/A:N/A] Granulation A mount: [2:Red] [N/A:N/A] Granulation Quality: [2:Small (1-33%)] [N/A:N/A] Necrotic A mount: [2:Fat Layer (Subcutaneous Tissue): Yes N/A] Exposed Structures: [2:Fascia: No Tendon: No Muscle: No Joint: No Bone: No Small (1-33%)] [N/A:N/A] Epithelialization: [2:Debridement - Selective/Open Wound N/A] Debridement: Pre-procedure Verification/Time Out 13:37 [N/A:N/A] Taken: [2:Lidocaine 5% topical ointment] [N/A:N/A] Pain Control: [2:Callus] [N/A:N/A] Tissue Debrided: [2:Skin/Epidermis] [N/A:N/A] Level: [2:0.24] [N/A:N/A] Debridement A (sq cm): [2:rea Curette] [N/A:N/A] Instrument: [2:Moderate] [N/A:N/A] Bleeding: [2:Pressure] [N/A:N/A] Hemostasis A chieved: [2:0] [N/A:N/A] Procedural Pain: [2:0] [N/A:N/A] Post Procedural Pain: [2:Procedure was  tolerated well] [N/A:N/A] Debridement Treatment Response: [2:0.8x0.3x0.3] [N/A:N/A] Post Debridement Measurements L x W x D (cm) [2:0.057] [N/A:N/A] Post Debridement Volume: (cm) [2:Debridement] [N/A:N/A] Treatment Notes Electronic Signature(s) Signed: 11/19/2019 5:08:08 PM By: Linton Ham MD Signed: 11/20/2019 10:37:46 AM By: Carlene Coria RN Entered By: Linton Ham on 11/19/2019 13:43:19 -------------------------------------------------------------------------------- Multi-Disciplinary Care Plan Details Patient Name: Date of Service: [name], [name] 11/19/2019 1:00 PM Medical Record Number: [id number] Patient Account Number: [id number] Date of Birth/Sex: Treating RN: [birth year] (69 y.o. Orvan Falconer Primary Care Oanh Devivo: [name] Other Clinician: Referring Tiera Mensinger: Treating Azia Toutant/Extender: Wonda Horner in Treatment: 1 Active Inactive Nutrition Nursing Diagnoses: Impaired glucose control: actual or potential Goals: Patient/caregiver verbalizes understanding of need to maintain therapeutic glucose control per primary care physician Date Initiated: 11/12/2019 Target Resolution Date: 12/13/2019 Goal Status: Active Interventions: Assess HgA1c results as ordered upon admission and as needed Assess patient nutrition upon admission and as needed per policy Provide education on elevated blood sugars and impact on wound healing Notes: Wound/Skin Impairment Nursing Diagnoses: Knowledge deficit related to ulceration/compromised skin integrity Goals: Patient/caregiver will verbalize understanding of skin care regimen Date Initiated: 11/12/2019 Target Resolution Date: 12/13/2019 Goal Status: Active Ulcer/skin breakdown will have a volume reduction of 30% by week 4 Date Initiated: 11/12/2019 Target Resolution Date: 12/13/2019 Goal Status: Active Interventions: Assess patient/caregiver ability to obtain necessary supplies Assess  patient/caregiver ability to perform ulcer/skin care regimen upon admission and as needed Assess ulceration(s) every visit Notes: Electronic Signature(s) Signed: 11/20/2019 10:37:46 AM By: Carlene Coria RN Entered By: Carlene Coria on 11/19/2019 13:41:31 -------------------------------------------------------------------------------- Pain Assessment Details Patient Name: Date of Service: Oswaldo Conroy RA H A. 11/19/2019 1:00 PM Medical Record Number: [id number] Patient Account Number: [id number] Date of Birth/Sex: Treating RN: 1950/11/13 (69 y.o. Clearnce Sorrel Primary Care Benjimen Kelley: [name] Other Clinician: Referring Jerod Mcquain: Treating Payten Beaumier/Extender: Wonda Horner in Treatment: 1 Active Problems Location of Pain Severity and Description of Pain Patient Has Paino No Site Locations Pain Management and Medication Current Pain Management: Electronic Signature(s) Signed: 11/19/2019 5:00:49 PM By: Kela Millin Entered By: Kela Millin on 11/19/2019 13:15:25 -------------------------------------------------------------------------------- Patient/Caregiver Education Details Patient Name: Date of Service: Oswaldo Conroy RA Lemmie Evens A. 8/31/2021andnbsp1:00 PM Medical Record Number: [id number] Patient Account Number: [id number] Date of Birth/Gender: Treating RN: Aug 15, 1950 (69 y.o. Orvan Falconer Primary Care Physician: [name] Other Clinician: Referring Physician: Treating Physician/Extender: Wonda Horner in Treatment: 1 Education Assessment Education Provided To: Patient Education Topics Provided Wound/Skin Impairment: Methods: Explain/Verbal Responses: State content correctly Electronic Signature(s) Signed: 11/20/2019 10:37:46 AM By: Carlene Coria RN Entered By: Carlene Coria on 11/19/2019 13:41:53 -------------------------------------------------------------------------------- Wound Assessment Details Patient  Name: Date of Service: GRO SS,  [name] 11/19/2019 1:00 PM Medical Record Number: [id number] Patient Account Number: [id number] Date of Birth/Sex: Treating RN: 1951/03/16 (69 y.o. Clearnce Sorrel Primary Care Joscelyne Renville: [name] Other Clinician: Referring Kandance Yano: Treating Ferd Horrigan/Extender: Wonda Horner in Treatment: 1 Wound Status Wound Number: 2 Primary Diabetic Wound/Ulcer of the Lower Extremity Etiology: Wound Location: Left Metatarsal head first Wound Open Wounding Event: Gradually Appeared Status: Date Acquired: 06/20/2019 Comorbid Cataracts, Sleep Apnea, Hypertension, Type II Diabetes, Weeks Of Treatment: 1 History: Osteoarthritis, Osteomyelitis, Neuropathy, Confinement Anxiety Clustered Wound: No Wound Measurements Length: (cm) 0.8 Width: (cm) 0.3 Depth: (cm) 0.3 Area: (cm) 0.188 Volume: (cm) 0.057 Wound Description Classification: Grade 1 Wound Margin: Thickened Exudate Amount: Small Exudate Type: Serosanguineous Exudate Color: red, brown Foul Odor After Cleansing: No Slough/Fibrino Yes % Reduction in Area: 70.1% % Reduction in Volume: 9.5% Epithelialization: Small (1-33%) Tunneling: No Undermining: Yes Starting Position (o'clock): 11 Ending Position (o'clock): 3 Maximum Distance: (cm) 0.4 Wound Bed Granulation Amount: Large (67-100%) Exposed Structure Granulation Quality: Red Fascia Exposed: No Necrotic Amount: Small (1-33%) Fat Layer (Subcutaneous Tissue) Exposed: Yes Necrotic Quality: Adherent Slough Tendon Exposed: No Muscle Exposed: No Joint Exposed: No Bone Exposed: No Treatment Notes Wound #2 (Left Metatarsal head first) 1. Cleanse With Wound Cleanser 2. Periwound Care Skin Prep 3. Primary Dressing Applied [employer] 4. Secondary Dressing Foam Border Dressing 7. Footwear/Offloading device applied Surgical shoe Notes felt callous pad Electronic Signature(s) Signed: 11/19/2019 5:00:49 PM By:  Kela Millin Entered By: Kela Millin on 11/19/2019 13:19:11 -------------------------------------------------------------------------------- Vitals Details Patient Name: Date of Service: [name], [name] 11/19/2019 1:00 PM Medical Record Number: [id number] Patient Account Number: [id number] Date of Birth/Sex: Treating RN: 02-24-1951 (69 y.o. Clearnce Sorrel Primary Care Dawit Tankard: [name] Other Clinician: Referring Dayzha Pogosyan: Treating Draya Felker/Extender: Wonda Horner in Treatment: 1 Vital Signs Time Taken: 13:14 Temperature (F): 97.8 Height (in): 69 Pulse (bpm): 62 Weight (lbs): 258 Respiratory Rate (breaths/min): 18 Body Mass Index (BMI): 38.1 Blood Pressure (mmHg): 107/62 Capillary Blood Glucose (mg/dl): 102 Reference Range: 80 - 120 mg / dl Notes patient stated CBG was 102 Electronic Signature(s) Signed: 11/19/2019 5:00:49 PM By: Kela Millin Entered By: Kela Millin on 11/19/2019 13:15:18

## 2019-11-20 NOTE — Progress Notes (Signed)
Holly, [name] ([id number]) Visit Report for 11/19/2019 Debridement Details Patient Name: Date of Service: [name] A. 11/19/2019 1:00 PM Medical Record Number: [id number] Patient Account Number: [id number] Date of Birth/Sex: Treating RN: Apr 24, 1950 (69 y.o. [name] Primary Care Provider: Harlan [name] Other Clinician: Referring Provider: Treating Provider/Extender: [name] in Treatment: 1 Debridement Performed for Assessment: Wound #2 Left Metatarsal head first Performed By: Physician [name]., MD Debridement Type: Debridement Severity of Tissue Pre Debridement: Fat layer exposed Level of Consciousness (Pre-procedure): Awake and Alert Pre-procedure Verification/Time Out Yes - 13:37 Taken: Start Time: 13:37 Pain Control: Lidocaine 5% topical ointment T Area Debrided (L x W): otal 0.8 (cm) x 0.3 (cm) = 0.24 (cm) Tissue and other material debrided: Viable, Callus, Skin: Dermis , Skin: Epidermis Level: Skin/Epidermis Debridement Description: Selective/Open Wound Instrument: Curette Bleeding: Moderate Hemostasis Achieved: Pressure End Time: 13:39 Procedural Pain: 0 Post Procedural Pain: 0 Response to Treatment: Procedure was tolerated well Level of Consciousness (Post- Awake and Alert procedure): Post Debridement Measurements of Total Wound Length: (cm) 0.8 Width: (cm) 0.3 Depth: (cm) 0.3 Volume: (cm) 0.057 Character of Wound/Ulcer Post Debridement: Improved Severity of Tissue Post Debridement: Fat layer exposed Post Procedure Diagnosis Same as Pre-procedure Electronic Signature(s) Signed: 11/19/2019 5:08:08 PM By: Holly Ham MD Signed: 11/20/2019 10:37:46 AM By: Holly Coria RN Entered By: [name] on 11/19/2019 13:40:40 -------------------------------------------------------------------------------- HPI Details Patient Name: Date of Service: [name], Holly RA H A. 11/19/2019 1:00 PM Medical Record Number:  [id number] Patient Account Number: [id number] Date of Birth/Sex: Treating RN: 11/03/1950 (69 y.o. [name] Primary Care Provider: Harlan [name] Other Clinician: Referring Provider: Treating Provider/Extender: [name] in Treatment: 1 History of Present Illness HPI Description: 05/10/17 patient presents today for initial evaluation and our clinic concerning issues that she has been having with an ulceration on the plantar aspect of the left first metatarsal head. She has been seen a podiatrist [name] for this since August 2018. Subsequently he has recommended bunion surgery as that seems to be the culprit for why there is pressure and friction occurring at the site causing the callous and subsequently the wound. 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 With that being said I do have notes from several of his visits one of which does include an x-ray documentation stating that she had a negative x-ray. There was obviously the bunion the notes I have extended from 11 deformity. With that being said she has not had an MRI fortunately this wound does not probe to bone. The notes I have extended from February 08, 2017 through April 14, 2017. During that course she was also placed on antibiotics to help with an infection two weeks ago and this was doxycycline. Fortunately that seems to have completely resolved any infection issues that she may have had. Unfortunately this is an ulcer that she has actually been dealing with since April 2018. She just initially was trying to treat it and care for it on her own. Her most recent hemoglobin A1c was 5.8, she is a non-smoker, she does have a right first toe amputation and a left third toe amputation. Fortunately she's not having any discomfort at the site. 05/17/17 on evaluation  today patient's ulcer on the plantar aspect of the first metatarsal region appears to be close  at this point. There does not appear to be any evidence of ulcer opening. This is on initial inspection. She has not really had any discomfort but honestly she really was not experiencing a lot of pain even before. There is no evidence of infection or fluctuation under the callous which is present at this point. ADMISSION 11/12/2019 This is a 69 year old woman that we previously had in this clinic in 2017 for two visits with a wound I think roughly in the same area. At that point she had a bunion in the left. She has apparently had surgery on this since then. Looking through care everywhere we can see that she has had follow-up with podiatry from 06/12/2019 through 10/28/2019 for a wound on the plantar aspect of her left first MTP. She states when this started she was having severe knee pain on the right which forced her to alter her gait. She has recently received doxycycline which she is finished and Bactroban. She is back to using Medihoney on the wound. She is offloading with the Pegasys shoe. Past medical history includes type 2 diabetes with a recent hemoglobin A1c of 6.8, severe type II diabetic neuropathy, history of a CVA, stage III chronic renal failure hypertension, depression, of bilateral previous bunion surgery, right first toe amputation and a partial left third toe amputation for osteomyelitis. The patient states that she recently had work to do in her mother's house in [place] and so she was on her feet quite a lot which delayed the healing. ABI in our clinic was 1.1 on the left 8/31; plantar aspect of her left first metatarsal head. Using silver collagen. This is probably a surgical wound Electronic Signature(s) Signed: 11/19/2019 5:08:08 PM By: Holly Ham MD Entered By: [name] on 11/19/2019  13:43:51 -------------------------------------------------------------------------------- Physical Exam Details Patient Name: Date of Service: [name], Holly RA H A. 11/19/2019 1:00 PM Medical Record Number: [id number] Patient Account Number: [id number] Date of Birth/Sex: Treating RN: [birth year] (69 y.o. [name] Primary Care Provider: Harlan [name] Other Clinician: Referring Provider: Treating Provider/Extender: [name] in Treatment: 1 Constitutional Sitting or standing Blood Pressure is within target range for patient.. Pulse regular and within target range for patient.Marland Kitchen Respirations regular, non-labored and within target range.. Temperature is normal and within the target range for the patient.Marland Kitchen Appears in no distress. Notes Wound exam; plantar left first metatarsal head. I removed callus and skin from around the wound margin but generally this looks quite good. Base tissue looks healthy. I would say about 60% of this original wound is closed Electronic Signature(s) Signed: 11/19/2019 5:08:08 PM By: Holly Ham MD Entered By: [name] on 11/19/2019 13:44:31 -------------------------------------------------------------------------------- Physician Orders Details Patient Name: Date of Service: [name], Holly RA H A. 11/19/2019 1:00 PM Medical Record Number: [id number] Patient Account Number: [id number] Date of Birth/Sex: Treating RN: December 13, 1950 (68 y.o. [name] Primary Care Provider: Harlan [name] Other Clinician: Referring Provider: Treating Provider/Extender: [name] in Treatment: 1 Verbal / Phone Orders: No Diagnosis Coding ICD-10 Coding Code Description E11.621 Type 2 diabetes mellitus with foot ulcer E11.42 Type 2 diabetes mellitus with diabetic polyneuropathy L97.522 Non-pressure chronic ulcer of other part of left foot with fat layer exposed Follow-up Appointments Return Appointment in 2  weeks. Dressing Change Frequency Wound #2 Left Metatarsal head first Change Dressing every other day. Wound Cleansing Clean wound with Normal Saline. May shower and wash wound with soap and water. - may shower on days  dressing not changed Primary Wound Dressing Wound #2 Left Metatarsal head first Silver Collagen - moisten with hydrogel or KY jelly Secondary Dressing Kerlix/Rolled Gauze - secure with tape Dry Gauze Other: - felt pad Electronic Signature(s) Signed: 11/19/2019 5:08:08 PM By: Holly Ham MD Signed: 11/20/2019 10:37:46 AM By: Holly Coria RN Entered By: [name] on 11/19/2019 13:46:13 -------------------------------------------------------------------------------- Problem List Details Patient Name: Date of Service: [name], Holly RA H A. 11/19/2019 1:00 PM Medical Record Number: [id number] Patient Account Number: [id number] Date of Birth/Sex: Treating RN: [birth year] (69 y.o. [name] Primary Care Provider: Harlan [name] Other Clinician: Referring Provider: Treating Provider/Extender: [name] in Treatment: 1 Active Problems ICD-10 Encounter Code Description Active Date MDM Diagnosis E11.621 Type 2 diabetes mellitus with foot ulcer 11/12/2019 No Yes E11.42 Type 2 diabetes mellitus with diabetic polyneuropathy 11/12/2019 No Yes L97.522 Non-pressure chronic ulcer of other part of left foot with fat layer exposed 11/12/2019 No Yes Inactive Problems Resolved Problems Electronic Signature(s) Signed: 11/19/2019 5:08:08 PM By: Holly Ham MD Entered By: [name] on 11/19/2019 13:43:08 -------------------------------------------------------------------------------- Progress Note Details Patient Name: Date of Service: [name], Holly RA H A. 11/19/2019 1:00 PM Medical Record Number: [id number] Patient Account Number: [id number] Date of Birth/Sex: Treating RN: Apr 08, 1950 (69 y.o. [name] Primary Care Provider: Harlan [name] Other Clinician: Referring Provider: Treating Provider/Extender: [name] in Treatment: 1 Subjective History of Present Illness (HPI) 05/10/17 patient presents today for initial evaluation and our clinic concerning issues that she has been having with an ulceration on the plantar aspect of the left first metatarsal head. She has been seen a podiatrist [name] for this since August 2018. Subsequently he has recommended bunion surgery as that seems to be the culprit for why there is pressure and friction occurring at the site causing the callous and subsequently the wound. 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 With that being said I do have notes from several of his visits one of which does include an x-ray documentation stating that she had a negative x-ray. There was obviously the bunion the notes I have extended from 11 deformity. With that being said she has not had an MRI fortunately this wound does not probe to bone. The notes I have extended from February 08, 2017 through April 14, 2017. During that course she was also placed on antibiotics to help with an infection two weeks ago and this was doxycycline. Fortunately that seems to have completely resolved any infection issues that she may have had. Unfortunately this is an ulcer that she has actually been dealing with since April 2018. She just initially was trying to treat it and care for it on her own. Her most recent hemoglobin A1c was 5.8, she is a non-smoker, she does have a right first toe amputation and a left third toe amputation. Fortunately she's not having any discomfort at the site. 05/17/17 on evaluation today patient's ulcer on the plantar aspect of the first metatarsal region appears to be close at this point. There does not appear to be any  evidence of ulcer opening. This is on initial inspection. She has not really had any discomfort but honestly she really was not experiencing a lot of pain even before. There is no evidence of infection or fluctuation under the callous which is present at  this point. ADMISSION 11/12/2019 This is a 69 year old woman that we previously had in this clinic in 2017 for two visits with a wound I think roughly in the same area. At that point she had a bunion in the left. She has apparently had surgery on this since then. Looking through care everywhere we can see that she has had follow-up with podiatry from 06/12/2019 through 10/28/2019 for a wound on the plantar aspect of her left first MTP. She states when this started she was having severe knee pain on the right which forced her to alter her gait. She has recently received doxycycline which she is finished and Bactroban. She is back to using Medihoney on the wound. She is offloading with the Pegasys shoe. Past medical history includes type 2 diabetes with a recent hemoglobin A1c of 6.8, severe type II diabetic neuropathy, history of a CVA, stage III chronic renal failure hypertension, depression, of bilateral previous bunion surgery, right first toe amputation and a partial left third toe amputation for osteomyelitis. The patient states that she recently had work to do in her mother's house in [place] and so she was on her feet quite a lot which delayed the healing. ABI in our clinic was 1.1 on the left 8/31; plantar aspect of her left first metatarsal head. Using silver collagen. This is probably a surgical wound Objective Constitutional Sitting or standing Blood Pressure is within target range for patient.. Pulse regular and within target range for patient.Marland Kitchen Respirations regular, non-labored and within target range.. Temperature is normal and within the target range for the patient.Marland Kitchen Appears in no distress. Vitals Time Taken: 1:14 PM, Height: 69 in,  Weight: 258 lbs, BMI: 38.1, Temperature: 97.8 F, Pulse: 62 bpm, Respiratory Rate: 18 breaths/min, Blood Pressure: 107/62 mmHg, Capillary Blood Glucose: 102 mg/dl. General Notes: patient stated CBG was 102 General Notes: Wound exam; plantar left first metatarsal head. I removed callus and skin from around the wound margin but generally this looks quite good. Base tissue looks healthy. I would say about 60% of this original wound is closed Integumentary (Hair, Skin) Wound #2 status is Open. Original cause of wound was Gradually Appeared. The wound is located on the Left Metatarsal head first. The wound measures 0.8cm length x 0.3cm width x 0.3cm depth; 0.188cm^2 area and 0.057cm^3 volume. There is Fat Layer (Subcutaneous Tissue) exposed. There is no tunneling noted, however, there is undermining starting at 11:00 and ending at 3:00 with a maximum distance of 0.4cm. There is a small amount of serosanguineous drainage noted. The wound margin is thickened. There is large (67-100%) red granulation within the wound bed. There is a small (1-33%) amount of necrotic tissue within the wound bed including Adherent Slough. Assessment Active Problems ICD-10 Type 2 diabetes mellitus with foot ulcer Type 2 diabetes mellitus with diabetic polyneuropathy Non-pressure chronic ulcer of other part of left foot with fat layer exposed Procedures Wound #2 Pre-procedure diagnosis of Wound #2 is a Diabetic Wound/Ulcer of the Lower Extremity located on the Left Metatarsal head first .Severity of Tissue Pre Debridement is: Fat layer exposed. There was a Selective/Open Wound Skin/Epidermis Debridement with a total area of 0.24 sq cm performed by [name]., MD. With the following instrument(s): Curette to remove Viable tissue/material. Material removed includes Callus, Skin: Dermis, and Skin: Epidermis after achieving pain control using Lidocaine 5% topical ointment. No specimens were taken. A time out was  conducted at 13:37, prior to the start of the procedure. A Moderate amount  of bleeding was controlled with Pressure. The procedure was tolerated well with a pain level of 0 throughout and a pain level of 0 following the procedure. Post Debridement Measurements: 0.8cm length x 0.3cm width x 0.3cm depth; 0.057cm^3 volume. Character of Wound/Ulcer Post Debridement is improved. Severity of Tissue Post Debridement is: Fat layer exposed. Post procedure Diagnosis Wound #2: Same as Pre-Procedure Plan Follow-up Appointments: Return Appointment in 1 week. Dressing Change Frequency: Wound #2 Left Metatarsal head first: Change Dressing every other day. Wound Cleansing: Clean wound with Normal Saline. May shower and wash wound with soap and water. - may shower on days dressing not changed Primary Wound Dressing: Wound #2 Left Metatarsal head first: Silver Collagen - moisten with hydrogel or KY jelly Secondary Dressing: Kerlix/Rolled Gauze - secure with tape Dry Gauze Other: - felt pad 1. Continued with moistened silver collagen/Kerlix and gauze. 2. I have asked her to offload this aggressively by what ever means she can. She tells me she actually got a scooter I think secondhand but she is too unstable to use it. I did suggest this Electronic Signature(s) Signed: 11/19/2019 5:08:08 PM By: Holly Ham MD Entered By: [name] on 11/19/2019 13:45:27 -------------------------------------------------------------------------------- SuperBill Details Patient Name: Date of Service: [name], Holly RA H A. 11/19/2019 Medical Record Number: [id number] Patient Account Number: [id number] Date of Birth/Sex: Treating RN: 1950/06/06 (69 y.o. [name] Primary Care Provider: Harlan [name] Other Clinician: Referring Provider: Treating Provider/Extender: [name] in Treatment: 1 Diagnosis Coding ICD-10 Codes Code Description E11.621 Type 2 diabetes mellitus with  foot ulcer E11.42 Type 2 diabetes mellitus with diabetic polyneuropathy L97.522 Non-pressure chronic ulcer of other part of left foot with fat layer exposed Facility Procedures CPT4 Code: [id number] Description: [id number] - DEBRIDE WOUND 1ST 20 SQ CM OR < ICD-10 Diagnosis Description E11.621 Type 2 diabetes mellitus with foot ulcer L97.522 Non-pressure chronic ulcer of other part of left foot with fat layer exposed Modifier: Quantity: 1 Physician Procedures : CPT4 Code Description Modifier [id number] - WC PHYS DEBR WO ANESTH 20 SQ CM ICD-10 Diagnosis Description E11.621 Type 2 diabetes mellitus with foot ulcer L97.522 Non-pressure chronic ulcer of other part of left foot with fat layer exposed Quantity: 1 Electronic Signature(s) Signed: 11/19/2019 5:08:08 PM By: Holly Ham MD Entered By: [name] on 11/19/2019 13:45:54

## 2019-11-28 ENCOUNTER — Encounter (HOSPITAL_BASED_OUTPATIENT_CLINIC_OR_DEPARTMENT_OTHER): Payer: PPO | Admitting: Internal Medicine

## 2019-12-03 ENCOUNTER — Other Ambulatory Visit: Payer: Self-pay

## 2019-12-03 ENCOUNTER — Encounter (HOSPITAL_BASED_OUTPATIENT_CLINIC_OR_DEPARTMENT_OTHER): Payer: PPO | Attending: Internal Medicine | Admitting: Internal Medicine

## 2019-12-03 DIAGNOSIS — E11621 Type 2 diabetes mellitus with foot ulcer: Secondary | ICD-10-CM | POA: Diagnosis not present

## 2019-12-03 DIAGNOSIS — E1142 Type 2 diabetes mellitus with diabetic polyneuropathy: Secondary | ICD-10-CM | POA: Diagnosis not present

## 2019-12-03 DIAGNOSIS — L97522 Non-pressure chronic ulcer of other part of left foot with fat layer exposed: Secondary | ICD-10-CM | POA: Diagnosis not present

## 2019-12-04 DIAGNOSIS — I129 Hypertensive chronic kidney disease with stage 1 through stage 4 chronic kidney disease, or unspecified chronic kidney disease: Secondary | ICD-10-CM | POA: Diagnosis not present

## 2019-12-04 DIAGNOSIS — F3342 Major depressive disorder, recurrent, in full remission: Secondary | ICD-10-CM | POA: Diagnosis not present

## 2019-12-04 DIAGNOSIS — E1121 Type 2 diabetes mellitus with diabetic nephropathy: Secondary | ICD-10-CM | POA: Diagnosis not present

## 2019-12-04 DIAGNOSIS — E1142 Type 2 diabetes mellitus with diabetic polyneuropathy: Secondary | ICD-10-CM | POA: Diagnosis not present

## 2019-12-04 DIAGNOSIS — E1140 Type 2 diabetes mellitus with diabetic neuropathy, unspecified: Secondary | ICD-10-CM | POA: Diagnosis not present

## 2019-12-04 DIAGNOSIS — E785 Hyperlipidemia, unspecified: Secondary | ICD-10-CM | POA: Diagnosis not present

## 2019-12-04 DIAGNOSIS — E1165 Type 2 diabetes mellitus with hyperglycemia: Secondary | ICD-10-CM | POA: Diagnosis not present

## 2019-12-04 DIAGNOSIS — F3341 Major depressive disorder, recurrent, in partial remission: Secondary | ICD-10-CM | POA: Diagnosis not present

## 2019-12-04 DIAGNOSIS — I2510 Atherosclerotic heart disease of native coronary artery without angina pectoris: Secondary | ICD-10-CM | POA: Diagnosis not present

## 2019-12-04 DIAGNOSIS — N183 Chronic kidney disease, stage 3 unspecified: Secondary | ICD-10-CM | POA: Diagnosis not present

## 2019-12-04 DIAGNOSIS — F4321 Adjustment disorder with depressed mood: Secondary | ICD-10-CM | POA: Diagnosis not present

## 2019-12-09 NOTE — Progress Notes (Signed)
[name] ([id number]) Visit Report for 12/03/2019 HPI Details Patient Name: Date of Service: [name] A. 12/03/2019 1:30 PM Medical Record Number: [id number] Patient Account Number: [id number] Date of Birth/Sex: Treating RN: 1950/09/20 (69 y.o. [name] Primary Care Provider: Harlan [name] Other Clinician: Referring Provider: Treating Provider/Extender: [name] in Treatment: 3 History of Present Illness HPI Description: 05/10/17 patient presents today for initial evaluation and our clinic concerning issues that she has been having with an ulceration on the plantar aspect of the left first metatarsal head. She has been seen a podiatrist Dr. Jacqualyn [name] for this since August 2018. Subsequently he has recommended bunion surgery as that seems to be the culprit for why there is pressure and friction occurring at the site causing the callous and subsequently the wound. 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 With that being said I do have notes from several of his visits one of which does include an x-ray documentation stating that she had a negative x-ray. There was obviously the bunion the notes I have extended from 11 deformity. With that being said she has not had an MRI fortunately this wound does not probe to bone. The notes I have extended from February 08, 2017 through April 14, 2017. During that course she was also placed on antibiotics to help with an infection two weeks ago and this was doxycycline. Fortunately that seems to have completely resolved any infection issues that she may have had. Unfortunately this is an ulcer that she has actually been dealing with since April 2018. She just initially was trying to treat it and care for it on her own. Her most recent hemoglobin A1c was 5.8, she is a  non-smoker, she does have a right first toe amputation and a left third toe amputation. Fortunately she's not having any discomfort at the site. 05/17/17 on evaluation today patient's ulcer on the plantar aspect of the first metatarsal region appears to be close at this point. There does not appear to be any evidence of ulcer opening. This is on initial inspection. She has not really had any discomfort but honestly she really was not experiencing a lot of pain even before. There is no evidence of infection or fluctuation under the callous which is present at this point. ADMISSION 11/12/2019 This is a 69 year old woman that we previously had in this clinic in 2017 for two visits with a wound I think roughly in the same area. At that point she had a bunion in the left. She has apparently had surgery on this since then. Looking through care everywhere we can see that she has had follow-up with podiatry from 06/12/2019 through 10/28/2019 for a wound on the plantar aspect of her left first MTP. She states when this started she was having severe knee pain on the right which forced her to alter her gait. She has recently received doxycycline which she is finished and Bactroban. She is back to using Medihoney on the wound. She is offloading with the Pegasys shoe. Past medical history includes type 2 diabetes with a recent hemoglobin A1c of 6.8, severe type II diabetic neuropathy, history of a CVA, stage III chronic renal failure hypertension, depression, of bilateral previous bunion surgery, right first toe amputation and a partial left third toe amputation for osteomyelitis. The patient states that she  recently had work to do in her mother's house in [place] and so she was on her feet quite a lot which delayed the healing. ABI in our clinic was 1.1 on the left 8/31; plantar aspect of her left first metatarsal head. Using silver collagen. This is probably a surgical wound 9/14; this is a patient who had a  wound on the plantar aspect of her left first metatarsal head in the setting of a significant bunion deformity. We use cervical collagen and a offloading shoe. She has significant diabetic neuropathy probably some degree of a Charcot deformity in her feet. This is closed over today. Electronic Signature(s) Signed: 12/03/2019 5:12:45 PM By: Holly Ham MD Entered By: [name] on 12/03/2019 17:01:59 -------------------------------------------------------------------------------- Physical Exam Details Patient Name: Date of Service: [name], Holly RA H A. 12/03/2019 1:30 PM Medical Record Number: [id number] Patient Account Number: [id number] Date of Birth/Sex: Treating RN: 1950-04-21 (69 y.o. [name] Primary Care Provider: Harlan [name] Other Clinician: Referring Provider: Treating Provider/Extender: [name] in Treatment: 3 Constitutional Sitting or standing Blood Pressure is within target range for patient.. Pulse regular and within target range for patient.Marland Kitchen Respirations regular, non-labored and within target range.. Temperature is normal and within the target range for the patient.Marland Kitchen Appears in no distress. Cardiovascular Pedal pulses are easily palpable. Musculoskeletal Pes planus deformity of her feet bunion deformity also. She has had surgery in this area. Notes Wound exam; plantar left first metatarsal head. Some scant amount of callus over where the wound was but this is basically close there is no open area no drainage and no erythema. Electronic Signature(s) Signed: 12/03/2019 5:12:45 PM By: Holly Ham MD Entered By: [name] on 12/03/2019 17:02:55 -------------------------------------------------------------------------------- Physician Orders Details Patient Name: Date of Service: [name], Holly RA H A. 12/03/2019 1:30 PM Medical Record Number: [id number] Patient Account Number: [id number] Date of Birth/Sex: Treating  RN: 11/14/1950 (69 y.o. [name] Primary Care Provider: Harlan [name] Other Clinician: Referring Provider: Treating Provider/Extender: [name] in Treatment: 3 Verbal / Phone Orders: No Diagnosis Coding ICD-10 Coding Code Description E11.621 Type 2 diabetes mellitus with foot ulcer E11.42 Type 2 diabetes mellitus with diabetic polyneuropathy L97.522 Non-pressure chronic ulcer of other part of left foot with fat layer exposed Discharge From [facility] Discharge from [place] - patient to pad area Electronic Signature(s) Signed: 12/03/2019 5:12:45 PM By: Holly Ham MD Signed: 12/09/2019 1:15:48 PM By: Carlene Coria RN Entered By: Carlene Coria on 12/03/2019 14:12:25 -------------------------------------------------------------------------------- Problem List Details Patient Name: Date of Service: [name], Holly RA H A. 12/03/2019 1:30 PM Medical Record Number: [id number] Patient Account Number: [id number] Date of Birth/Sex: Treating RN: 13-Sep-1950 (69 y.o. [name] Primary Care Provider: Harlan [name] Other Clinician: Referring Provider: Treating Provider/Extender: [name] in Treatment: 3 Active Problems ICD-10 Encounter Code Description Active Date MDM Diagnosis E11.621 Type 2 diabetes mellitus with foot ulcer 11/12/2019 No Yes E11.42 Type 2 diabetes mellitus with diabetic polyneuropathy 11/12/2019 No Yes L97.522 Non-pressure chronic ulcer of other part of left foot with fat layer exposed 11/12/2019 No Yes Inactive Problems Resolved Problems Electronic Signature(s) Signed: 12/03/2019 5:12:45 PM By: Holly Ham MD Entered By: [name] on 12/03/2019 16:59:45 -------------------------------------------------------------------------------- Progress Note Details Patient Name: Date of Service: [name], Holly RA H A. 12/03/2019 1:30 PM Medical Record Number: [id number] Patient Account Number:  [id number] Date of Birth/Sex: Treating RN: [birth year] (69 y.o. [name] Primary Care  Provider: Harlan [name] Other Clinician: Referring Provider: Treating Provider/Extender: [name] in Treatment: 3 Subjective History of Present Illness (HPI) 05/10/17 patient presents today for initial evaluation and our clinic concerning issues that she has been having with an ulceration on the plantar aspect of the left first metatarsal head. She has been seen a podiatrist Dr. Jacqualyn [name] for this since August 2018. Subsequently he has recommended bunion surgery as that seems to be the culprit for why there is pressure and friction occurring at the site causing the callous and subsequently the wound. 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 With that being said I do have notes from several of his visits one of which does include an x-ray documentation stating that she had a negative x-ray. There was obviously the bunion the notes I have extended from 11 deformity. With that being said she has not had an MRI fortunately this wound does not probe to bone. The notes I have extended from February 08, 2017 through April 14, 2017. During that course she was also placed on antibiotics to help with an infection two weeks ago and this was doxycycline. Fortunately that seems to have completely resolved any infection issues that she may have had. Unfortunately this is an ulcer that she has actually been dealing with since April 2018. She just initially was trying to treat it and care for it on her own. Her most recent hemoglobin A1c was 5.8, she is a non-smoker, she does have a right first toe amputation and a left third toe amputation. Fortunately she's not having any discomfort at the site. 05/17/17 on evaluation today patient's ulcer on the plantar  aspect of the first metatarsal region appears to be close at this point. There does not appear to be any evidence of ulcer opening. This is on initial inspection. She has not really had any discomfort but honestly she really was not experiencing a lot of pain even before. There is no evidence of infection or fluctuation under the callous which is present at this point. ADMISSION 11/12/2019 This is a 69 year old woman that we previously had in this clinic in 2017 for two visits with a wound I think roughly in the same area. At that point she had a bunion in the left. She has apparently had surgery on this since then. Looking through care everywhere we can see that she has had follow-up with podiatry from 06/12/2019 through 10/28/2019 for a wound on the plantar aspect of her left first MTP. She states when this started she was having severe knee pain on the right which forced her to alter her gait. She has recently received doxycycline which she is finished and Bactroban. She is back to using Medihoney on the wound. She is offloading with the Pegasys shoe. Past medical history includes type 2 diabetes with a recent hemoglobin A1c of 6.8, severe type II diabetic neuropathy, history of a CVA, stage III chronic renal failure hypertension, depression, of bilateral previous bunion surgery, right first toe amputation and a partial left third toe amputation for osteomyelitis. The patient states that she recently had work to do in her mother's house in [place] and so she was on her feet quite a lot which delayed the healing. ABI in our clinic was 1.1 on the left 8/31; plantar aspect of her left first metatarsal head. Using  silver collagen. This is probably a surgical wound 9/14; this is a patient who had a wound on the plantar aspect of her left first metatarsal head in the setting of a significant bunion deformity. We use cervical collagen and a offloading shoe. She has significant diabetic neuropathy probably  some degree of a Charcot deformity in her feet. This is closed over today. Objective Constitutional Sitting or standing Blood Pressure is within target range for patient.. Pulse regular and within target range for patient.Marland Kitchen Respirations regular, non-labored and within target range.. Temperature is normal and within the target range for the patient.Marland Kitchen Appears in no distress. Vitals Time Taken: 2:44 PM, Height: 69 in, Weight: 258 lbs, BMI: 38.1, Temperature: 98.6 F, Pulse: 73 bpm, Respiratory Rate: 18 breaths/min, Blood Pressure: 123/70 mmHg, Capillary Blood Glucose: 151 mg/dl. Cardiovascular Pedal pulses are easily palpable. Musculoskeletal Pes planus deformity of her feet bunion deformity also. She has had surgery in this area. General Notes: Wound exam; plantar left first metatarsal head. Some scant amount of callus over where the wound was but this is basically close there is no open area no drainage and no erythema. Integumentary (Hair, Skin) Wound #2 status is Healed - Epithelialized. Original cause of wound was Gradually Appeared. The wound is located on the Left Metatarsal head first. The wound measures 0cm length x 0cm width x 0cm depth; 0cm^2 area and 0cm^3 volume. There is no tunneling or undermining noted. There is a none present amount of drainage noted. The wound margin is thickened. There is no granulation within the wound bed. There is no necrotic tissue within the wound bed. Assessment Active Problems ICD-10 Type 2 diabetes mellitus with foot ulcer Type 2 diabetes mellitus with diabetic polyneuropathy Non-pressure chronic ulcer of other part of left foot with fat layer exposed Plan [facility]charge From [facility]: Discharge from [place] - patient to pad area 1. The patient can be discharged from the wound care center 2. She is going to need to pad these this area going forward. She wears crocs I told her I did not think this would be sufficient to offload this area.  She has problems because her forefoot is so wide. I asked her if there were any options for custom made diabetic foot wear and she said there were. HOWEVER for now she is going to have to find a way to offload this somewhat of her foot where she wears Electronic Signature(s) Signed: 12/03/2019 5:12:45 PM By: Holly Ham MD Entered By: [name] on 12/03/2019 17:04:07 -------------------------------------------------------------------------------- SuperBill Details Patient Name: Date of Service: [name], Holly RA H A. 12/03/2019 Medical Record Number: [id number] Patient Account Number: [id number] Date of Birth/Sex: Treating RN: [birth year] (69 y.o. [name] Primary Care Provider: Harlan [name] Other Clinician: Referring Provider: Treating Provider/Extender: [name] in Treatment: 3 Diagnosis Coding ICD-10 Codes Code Description E11.621 Type 2 diabetes mellitus with foot ulcer E11.42 Type 2 diabetes mellitus with diabetic polyneuropathy L97.522 Non-pressure chronic ulcer of other part of left foot with fat layer exposed Physician Procedures : CPT4 Code Description Modifier [id number] - WC PHYS LEVEL 2 - EST PT ICD-10 Diagnosis Description E11.621 Type 2 diabetes mellitus with foot ulcer E11.42 Type 2 diabetes mellitus with diabetic polyneuropathy L97.522 Non-pressure chronic ulcer of  other part of left foot with fat layer exposed Quantity: 1 Electronic Signature(s) Signed: 12/03/2019 5:12:45 PM By: Holly Ham MD Entered By: [name] on 12/03/2019 17:04:35

## 2019-12-09 NOTE — Progress Notes (Signed)
Holly, [name] ([id number]) Visit Report for 12/03/2019 Arrival Information Details Patient Name: Date of Service: Holly Alar A. 12/03/2019 1:30 PM Medical Record Number: [id number] Patient Account Number: [id number] Date of Birth/Sex: Treating RN: 11-15-1950 (69 y.o. Orvan Falconer Primary Care Akyah Lagrange: [name] Other Clinician: Referring Kamren Heintzelman: Treating Brevon Dewald/Extender: Wonda Horner in Treatment: 3 Visit Information History Since Last Visit Added or deleted any medications: No Patient Arrived: Ambulatory Any new allergies or adverse reactions: No Arrival Time: 13:43 Had a fall or experienced change in No Accompanied By: self activities of daily living that may affect Transfer Assistance: None risk of falls: Patient Identification Verified: Yes Signs or symptoms of abuse/neglect since last visito No Secondary Verification Process Completed: Yes Hospitalized since last visit: No Patient Requires Transmission-Based Precautions: No Implantable device outside of the clinic excluding No Patient Has Alerts: No cellular tissue based products placed in the center since last visit: Has Dressing in Place as Prescribed: Yes Pain Present Now: No Electronic Signature(s) Signed: 12/04/2019 8:07:26 AM By: Sandre Kitty Entered By: Sandre Kitty on 12/03/2019 13:44:52 -------------------------------------------------------------------------------- Encounter Discharge Information Details Patient Name: Date of Service: [name], Holly RA H A. 12/03/2019 1:30 PM Medical Record Number: [id number] Patient Account Number: [id number] Date of Birth/Sex: Treating RN: March 27, 1950 (69 y.o. Orvan Falconer Primary Care Addis Tuohy: [name] Other Clinician: Referring Eron Staat: Treating Seniyah Esker/Extender: Wonda Horner in Treatment: 3 Encounter Discharge Information Items Discharge Condition: Stable Ambulatory Status:  Ambulatory Discharge Destination: Home Transportation: Private Auto Accompanied By: self Schedule Follow-up Appointment: Yes Clinical Summary of Care: Patient Declined Electronic Signature(s) Signed: 12/09/2019 1:15:48 PM By: Carlene Coria RN Entered By: Carlene Coria on 12/03/2019 14:14:24 -------------------------------------------------------------------------------- Lower Extremity Assessment Details Patient Name: Date of Service: Holly Conroy RA H A. 12/03/2019 1:30 PM Medical Record Number: [id number] Patient Account Number: [id number] Date of Birth/Sex: Treating RN: 21-Sep-1950 (69 y.o. Nancy Fetter Primary Care Geramy Lamorte: [name] Other Clinician: Referring Aayansh Codispoti: Treating Orel Hord/Extender: Wonda Horner in Treatment: 3 Edema Assessment Assessed: Shirlyn Goltz: No] [Right: No] Edema: [Left: Ye] [Right: s] Calf Left: Right: Point of Measurement: cm From Medial Instep 49 cm cm Ankle Left: Right: Point of Measurement: cm From Medial Instep 28.5 cm cm Vascular Assessment Pulses: Dorsalis Pedis Palpable: [Left:Yes] Electronic Signature(s) Signed: 12/04/2019 5:57:30 PM By: Levan Hurst RN, BSN Entered By: Levan Hurst on 12/03/2019 13:50:59 -------------------------------------------------------------------------------- [place] Details Patient Name: Date of Service: [name], Holly RA H A. 12/03/2019 1:30 PM Medical Record Number: [id number] Patient Account Number: [id number] Date of Birth/Sex: Treating RN: 06-Apr-1950 (69 y.o. Orvan Falconer Primary Care Harpreet Pompey: [name] Other Clinician: Referring Laportia Carley: Treating Armistead Sult/Extender: Wonda Horner in Treatment: 3 Active Inactive Electronic Signature(s) Signed: 12/09/2019 1:15:48 PM By: Carlene Coria RN Entered By: Carlene Coria on 12/03/2019 14:12:47 -------------------------------------------------------------------------------- Pain  Assessment Details Patient Name: Date of Service: Holly Conroy RA H A. 12/03/2019 1:30 PM Medical Record Number: [id number] Patient Account Number: [id number] Date of Birth/Sex: Treating RN: [birth year] (69 y.o. Orvan Falconer Primary Care Kewana Sanon: [name] Other Clinician: Referring Kaylynn Chamblin: Treating Koal Eslinger/Extender: Wonda Horner in Treatment: 3 Active Problems Location of Pain Severity and Description of Pain Patient Has Paino No Site Locations Pain Management and Medication Current Pain Management: Electronic Signature(s) Signed: 12/04/2019 8:07:26 AM By: Sandre Kitty Signed: 12/09/2019 1:15:48 PM By: Carlene Coria RN Entered By: Sandre Kitty on 12/03/2019 13:45:19 -------------------------------------------------------------------------------- Patient/Caregiver Education Details Patient Name:  Date of Service: [name] 9/14/2021andnbsp1:30 PM Medical Record Number: [id number] Patient Account Number: [id number] Date of Birth/Gender: Treating RN: [birth year] (69 y.o. Orvan Falconer Primary Care Physician: [name] Other Clinician: Referring Physician: Treating Physician/Extender: Wonda Horner in Treatment: 3 Education Assessment Education Provided To: Patient Education Topics Provided Elevated Blood Sugar/ Impact on Healing: Methods: Explain/Verbal Responses: State content correctly Electronic Signature(s) Signed: 12/09/2019 1:15:48 PM By: Carlene Coria RN Entered By: Carlene Coria on 12/03/2019 14:13:02 -------------------------------------------------------------------------------- Wound Assessment Details Patient Name: Date of Service: [name], Holly Dess RA H A. 12/03/2019 1:30 PM Medical Record Number: [id number] Patient Account Number: [id number] Date of Birth/Sex: Treating RN: [birth year] (69 y.o. Orvan Falconer Primary Care Renatta Shrieves: [name] Other Clinician: Referring  Tajuanna Burnett: Treating Amontae Ng/Extender: Wonda Horner in Treatment: 3 Wound Status Wound Number: 2 Primary Diabetic Wound/Ulcer of the Lower Extremity Etiology: Wound Location: Left Metatarsal head first Wound Healed - Epithelialized Wounding Event: Gradually Appeared Status: Date Acquired: 06/20/2019 Comorbid Cataracts, Sleep Apnea, Hypertension, Type II Diabetes, Weeks Of Treatment: 3 History: Osteoarthritis, Osteomyelitis, Neuropathy, Confinement Anxiety Clustered Wound: No Photos Photo Uploaded By: Mikeal Hawthorne on 12/05/2019 11:45:49 Wound Measurements Length: (cm) Width: (cm) Depth: (cm) Area: (cm) Volume: (cm) 0 % Reduction in Area: 100% 0 % Reduction in Volume: 100% 0 Epithelialization: Large (67-100%) 0 Tunneling: No 0 Undermining: No Wound Description Classification: Grade 1 Wound Margin: Thickened Exudate Amount: None Present Foul Odor After Cleansing: No Slough/Fibrino No Wound Bed Granulation Amount: None Present (0%) Exposed Structure Necrotic Amount: None Present (0%) Fascia Exposed: No Fat Layer (Subcutaneous Tissue) Exposed: No Tendon Exposed: No Muscle Exposed: No Joint Exposed: No Bone Exposed: No Electronic Signature(s) Signed: 12/09/2019 1:15:48 PM By: Carlene Coria RN Entered By: Carlene Coria on 12/03/2019 14:13:30 -------------------------------------------------------------------------------- Vitals Details Patient Name: Date of Service: [name], Holly RA H A. 12/03/2019 1:30 PM Medical Record Number: [id number] Patient Account Number: [id number] Date of Birth/Sex: Treating RN: [birth year] (69 y.o. Orvan Falconer Primary Care Orly Quimby: [name] Other Clinician: Referring Shiesha Jahn: Treating Silvester Reierson/Extender: Wonda Horner in Treatment: 3 Vital Signs Time Taken: 14:44 Temperature (F): 98.6 Height (in): 69 Pulse (bpm): 73 Weight (lbs): 258 Respiratory Rate (breaths/min): 18 Body  Mass Index (BMI): 38.1 Blood Pressure (mmHg): 123/70 Capillary Blood Glucose (mg/dl): 151 Reference Range: 80 - 120 mg / dl Electronic Signature(s) Signed: 12/04/2019 8:07:26 AM By: Sandre Kitty Entered By: Sandre Kitty on 12/03/2019 13:45:14

## 2019-12-20 ENCOUNTER — Other Ambulatory Visit: Payer: Self-pay

## 2019-12-20 ENCOUNTER — Other Ambulatory Visit: Payer: PPO | Admitting: Orthotics

## 2019-12-20 ENCOUNTER — Encounter: Payer: Self-pay | Admitting: Podiatry

## 2019-12-20 ENCOUNTER — Ambulatory Visit (INDEPENDENT_AMBULATORY_CARE_PROVIDER_SITE_OTHER): Payer: PPO | Admitting: Podiatry

## 2019-12-20 DIAGNOSIS — E1142 Type 2 diabetes mellitus with diabetic polyneuropathy: Secondary | ICD-10-CM

## 2019-12-20 DIAGNOSIS — L97521 Non-pressure chronic ulcer of other part of left foot limited to breakdown of skin: Secondary | ICD-10-CM | POA: Diagnosis not present

## 2019-12-20 DIAGNOSIS — M79675 Pain in left toe(s): Secondary | ICD-10-CM

## 2019-12-20 DIAGNOSIS — B351 Tinea unguium: Secondary | ICD-10-CM | POA: Diagnosis not present

## 2019-12-20 DIAGNOSIS — Z89411 Acquired absence of right great toe: Secondary | ICD-10-CM

## 2019-12-20 DIAGNOSIS — C50919 Malignant neoplasm of unspecified site of unspecified female breast: Secondary | ICD-10-CM

## 2019-12-20 DIAGNOSIS — M79674 Pain in right toe(s): Secondary | ICD-10-CM

## 2019-12-20 DIAGNOSIS — L84 Corns and callosities: Secondary | ICD-10-CM

## 2019-12-20 DIAGNOSIS — E08621 Diabetes mellitus due to underlying condition with foot ulcer: Secondary | ICD-10-CM | POA: Diagnosis not present

## 2019-12-20 HISTORY — DX: Malignant neoplasm of unspecified site of unspecified female breast: C50.919

## 2019-12-20 NOTE — Patient Instructions (Addendum)
Felt callus pads U-shape  Pads from [email or web address]  Dr. Marlaine Hind Gel Callus Cushion (blue; self-sticking and reusable)  DRESSING CHANGES left foot:   A. IF DISPENSED, WEAR SURGICAL SHOE/BOOT AT ALL TIMES.  B. IF PRESCRIBED ORAL ANTIBIOTICS, TAKE ALL MEDICATION AS PRESCRIBED UNTIL ALL ARE GONE.  C. IF DOCTOR HAS DESIGNATED NONWEIGHTBEARING STATUS, PLEASE ADHERE TO INSTRUCTIONS.   1. KEEP left foot DRY AT ALL TIMES!!!!  2. CLEANSE ULCER WITH SALINE.  3. DAB DRY WITH GAUZE SPONGE.  4. APPLY A LIGHT AMOUNT OF Mupirocin Ointment TO BASE OF ULCER.  5. APPLY OUTER DRESSING AS INSTRUCTED.  6. WEAR SURGICAL SHOE/BOOT DAILY AT ALL TIMES. IF SUPPLIED, WEAR HEEL PROTECTORS AT ALL TIMES WHEN IN BED.  7. DO NOT WALK BAREFOOT!!!  8.  IF YOU EXPERIENCE ANY FEVER, CHILLS, NIGHTSWEATS, NAUSEA OR VOMITING, ELEVATED OR LOW BLOOD SUGARS, REPORT TO EMERGENCY ROOM.  9. IF YOU EXPERIENCE INCREASED REDNESS, PAIN, SWELLING, DISCOLORATION, ODOR, PUS, DRAINAGE OR WARMTH OF YOUR FOOT, REPORT TO EMERGENCY ROOM.

## 2019-12-22 NOTE — Progress Notes (Addendum)
Subjective: Patient presents today with diabetes, s/p amputation right hallux and left 3rd digit  with  painful, discolored, thick toenails which interfere with daily activities. Pain is aggravated when wearing enclosed shoe gear. Pain is getting progressively worse and relieved with periodic professional debridement.  She states she has been seeing Dr. Dellia Nims and was told her ulcer is healed. She was told they are here if she needs them in the future.  Also states she has not traveled to [place] since her wound developed. She is trying to stay closer to home and take care of herself. She states a small piece of skin came off of the area this morning. She denies any increased redness, drainage or odor. No warmth. No fever, chills, night sweats, nausea or vomiting.  [name], MD is patient's PCP. Last visit was 09/02/2019.  Current Outpatient Medications on File Prior to Visit  Medication Sig Dispense Refill  . amitriptyline (ELAVIL) 10 MG tablet Take 1 tablet (10 mg total) by mouth at bedtime. (Patient not taking: Reported on 11/04/2019) 20 tablet 0  . Ascorbic Acid (VITAMIN C) 100 MG tablet Take 100 mg by mouth daily. (Patient not taking: Reported on 11/04/2019)    . aspirin EC 81 MG tablet Take 81 mg by mouth daily. Swallow whole.    Marland Kitchen buPROPion (WELLBUTRIN XL) 300 MG 24 hr tablet Take 300 mg by mouth daily.    . carvedilol (COREG) 6.25 MG tablet Take 1 tablet (6.25 mg total) by mouth 2 (two) times daily. 180 tablet 1  . Cholecalciferol (VITAMIN D3) 1.25 MG (50000 UT) TABS Take by mouth. (Patient not taking: Reported on 11/04/2019)    . doxycycline (VIBRA-TABS) 100 MG tablet Take 1 tablet (100 mg total) by mouth 2 (two) times daily. 20 tablet 0  . FLUoxetine (PROZAC) 40 MG capsule Take 40 mg by mouth daily.   0  . furosemide (LASIX) 40 MG tablet Take 1 tablet (40 mg total) by mouth daily. (Patient not taking: Reported on 11/04/2019) 90 tablet 3  . insulin NPH-regular Human (NOVOLIN 70/30) (70-30)  100 UNIT/ML injection Inject into the skin. 24 ml in the morning and 20 ml in the evening    . Inulin (FIBER CHOICE PO) Take by mouth.    . Lancets (ONETOUCH DELICA PLUS ALPFXT02I) MISC USE AS DIRECTED 3 TIMES A DAY    . losartan (COZAAR) 100 MG tablet Take 100 mg by mouth daily.     . magnesium citrate SOLN Take 1 Bottle by mouth once. (Patient not taking: Reported on 11/04/2019)    . metFORMIN (GLUCOPHAGE-XR) 500 MG 24 hr tablet Take 1,000 mg by mouth in the morning and at bedtime. PT TAKES 2000 MG DAILY    . MONOJECT INSULIN SYRINGE 31G X 5/16" 1 ML MISC See admin instructions.    . mupirocin ointment (BACTROBAN) 2 % Apply 1 application topically 2 (two) times daily. 30 g 2  . naproxen (NAPROSYN) 250 MG tablet Take 1 tablet by mouth in the morning and at bedtime.    Marland Kitchen nystatin cream (MYCOSTATIN) 1 APPLICATION TWICE A DAY EXTERNALLY    . ONETOUCH VERIO test strip     . pravastatin (PRAVACHOL) 40 MG tablet Take 1 tablet once a day for cholesterol    . SODIUM CHLORIDE, EXTERNAL, (SALINE WOUND [place]) 0.9 % SOLN Cleanse left foot ulcer with saline once daily. 210 mL 0  . zinc sulfate 220 (50 Zn) MG capsule Take 220 mg by mouth daily. (Patient not taking: Reported  on 11/04/2019)     No current facility-administered medications on file prior to visit.     Allergies  Allergen Reactions  . Ciprofloxacin Other (See Comments)    Unknown  . Penicillins Other (See Comments)    Unknown  . Sulfa Antibiotics   . Sulfamethoxazole Other (See Comments)    Unknown     Objective: There were no vitals filed for this visit.  [name] is a pleasant 69 y.o. female morbidly obese in NAD.Marland Kitchen AAO X 3.  Vascular Examination: Capillary fill time to digits <3 seconds b/l lower extremities. Palpable pedal pulses b/l LE. Pedal hair absent. Lower extremity skin temperature gradient within normal limits. No pain with calf compression b/l.  Dermatological Examination: Pedal skin with normal turgor, texture  and tone bilaterally. No interdigital macerations bilaterally. Toenails L hallux, L 2nd toe, L 4th toe, L 5th toe, R 2nd toe, R 3rd toe, R 4th toe and R 5th toe elongated, discolored, dystrophic, thickened, and crumbly with subungual debris and tenderness to dorsal palpation.   Ulceration located submet head 1 left foot.  Predebridement measurements carried out today of 0.3 x 1.5 cm.  No redness, streaking or lymphangitis noted.  No probing to bone, no undermining, no active pus or purulence noted.  No deep abscess, no erythema, no edema, no cellulitis, no odor was encountered.    Hemorrhagic hyperkeratotic lesion medial aspect of left 2nd toe. No erythema, no edema, no drainage, no fluctuance.  Postdebridement measurements today are: 0.5 x 1.5 x 0.1 cm.  No redness, streaking or lymphangitis noted.  No probing to bone, no undermining, no active pus or purulence noted.  No deep abscess, no erythema, no edema, no cellulitis, no odor was encountered. Wound Base: epithelial Peri-wound: Normal Exudate: None: wound tissue dry Blood Loss during debridement: 0 cc('s). Description of tissue removed from ulceration today:  nonviable hyperkeratosis. Sign(s) of clinical bacterial infection: no clinical signs of infection noted on examination today.   Musculoskeletal Examination: Normal muscle strength 5/5 to all lower extremity muscle groups bilaterally. Hallux valgus with bunion deformity noted b/l lower extremities. Hammertoes noted to the L 2nd toe. Utilizes cane for ambulation assistance.   She is wearing her Crocs shoes on today's visit.  Neurological Examination: Protective sensation diminished with 10g monofilament b/l.  Assessment: 1. Pain due to onychomycosis of toenails of both feet   2. Corns   3. Diabetic ulcer of left foot associated with diabetes mellitus due to underlying condition, limited to breakdown of skin, unspecified part of foot ([place])   4. Status post amputation of right great  toe ([place])   5. Diabetic peripheral neuropathy associated with type 2 diabetes mellitus ([place])    Plan: -Patient was evaluated and treated and all questions answered.  -Patient/POA/Family member educated on diagnosis and treatment plan of routine ulcer debridement/wound care.  -Ulceration debridement achieved utilizing sharp excisional debridement with sterile scalpel blade.. Type/amount of devitalized tissue removed: nonviable hyperkeratosis -Today's ulcer size post-debridement: 0.5 x 1.5 x 0.1 cm and is partial thickness. -Ulceration cleansed with wound cleanser. triple antibiotic ointment applied to base of ulceration and secured with light dressing. -Wound responded well to today's debridement. -Patient risk factors affecting healing of ulcer: diabetes, diabetic neuropathy, history of prior amputation, foot deformity -[name] given written instructions on daily wound care for left foot ulceration. -Frequency of debridements needed to achieve healing: Every 2 weeks. -We did discuss her bunion deformity left foot as well as new development of  lesion on left 2nd toe.Will discuss her case with Dr. Jacqualyn Posey.  -Toenails 1-5 b/l were debrided in length and girth with sterile nail nippers and dremel without iatrogenic bleeding.  -Corn gently pared without complication left 2nd toe. She is to apply Mupirocin Ointment to digit once daily. Also dispensed silicone digital toe cap for daily protection. Apply every morning. Remove every evening. -Patient to report any pedal injuries to medical professional immediately. -Patient to continue soft, supportive shoe gear daily. -Patient/POA to call should there be question/concern in the interim.  Return in about 2 weeks (around 01/03/2020) for partial thickness ulcer left foot.  [name], DPM

## 2019-12-30 ENCOUNTER — Other Ambulatory Visit: Payer: Self-pay | Admitting: Radiology

## 2019-12-30 DIAGNOSIS — R921 Mammographic calcification found on diagnostic imaging of breast: Secondary | ICD-10-CM | POA: Diagnosis not present

## 2019-12-30 DIAGNOSIS — D0512 Intraductal carcinoma in situ of left breast: Secondary | ICD-10-CM | POA: Diagnosis not present

## 2020-01-01 DIAGNOSIS — H2513 Age-related nuclear cataract, bilateral: Secondary | ICD-10-CM | POA: Diagnosis not present

## 2020-01-01 DIAGNOSIS — H02834 Dermatochalasis of left upper eyelid: Secondary | ICD-10-CM | POA: Diagnosis not present

## 2020-01-01 DIAGNOSIS — E113293 Type 2 diabetes mellitus with mild nonproliferative diabetic retinopathy without macular edema, bilateral: Secondary | ICD-10-CM | POA: Diagnosis not present

## 2020-01-01 DIAGNOSIS — H43813 Vitreous degeneration, bilateral: Secondary | ICD-10-CM | POA: Diagnosis not present

## 2020-01-01 DIAGNOSIS — H02831 Dermatochalasis of right upper eyelid: Secondary | ICD-10-CM | POA: Diagnosis not present

## 2020-01-02 ENCOUNTER — Telehealth: Payer: Self-pay | Admitting: Podiatry

## 2020-01-02 ENCOUNTER — Encounter: Payer: Self-pay | Admitting: *Deleted

## 2020-01-02 DIAGNOSIS — D0512 Intraductal carcinoma in situ of left breast: Secondary | ICD-10-CM

## 2020-01-02 NOTE — Telephone Encounter (Signed)
Received voicemail from Tammy from [facility] advantage stating they have not received the certifying statement for the diabetic shoes /inserts and toe filler they will have to send for med review and may deny the authorization.  I returned call and left message for Tammy that it has been faxed to Dr Buddy Duty several times and we are waiting on him to reply.

## 2020-01-03 ENCOUNTER — Ambulatory Visit: Payer: PPO | Admitting: Podiatry

## 2020-01-03 ENCOUNTER — Other Ambulatory Visit: Payer: Self-pay

## 2020-01-03 DIAGNOSIS — Z89422 Acquired absence of other left toe(s): Secondary | ICD-10-CM

## 2020-01-03 DIAGNOSIS — Z8631 Personal history of diabetic foot ulcer: Secondary | ICD-10-CM | POA: Diagnosis not present

## 2020-01-03 DIAGNOSIS — Z89411 Acquired absence of right great toe: Secondary | ICD-10-CM | POA: Diagnosis not present

## 2020-01-03 DIAGNOSIS — E1142 Type 2 diabetes mellitus with diabetic polyneuropathy: Secondary | ICD-10-CM | POA: Diagnosis not present

## 2020-01-06 DIAGNOSIS — I2510 Atherosclerotic heart disease of native coronary artery without angina pectoris: Secondary | ICD-10-CM | POA: Diagnosis not present

## 2020-01-06 DIAGNOSIS — I10 Essential (primary) hypertension: Secondary | ICD-10-CM | POA: Diagnosis not present

## 2020-01-06 DIAGNOSIS — E1165 Type 2 diabetes mellitus with hyperglycemia: Secondary | ICD-10-CM | POA: Diagnosis not present

## 2020-01-06 DIAGNOSIS — E1140 Type 2 diabetes mellitus with diabetic neuropathy, unspecified: Secondary | ICD-10-CM | POA: Diagnosis not present

## 2020-01-06 DIAGNOSIS — I129 Hypertensive chronic kidney disease with stage 1 through stage 4 chronic kidney disease, or unspecified chronic kidney disease: Secondary | ICD-10-CM | POA: Diagnosis not present

## 2020-01-06 DIAGNOSIS — E785 Hyperlipidemia, unspecified: Secondary | ICD-10-CM | POA: Diagnosis not present

## 2020-01-06 DIAGNOSIS — N183 Chronic kidney disease, stage 3 unspecified: Secondary | ICD-10-CM | POA: Diagnosis not present

## 2020-01-06 DIAGNOSIS — E113299 Type 2 diabetes mellitus with mild nonproliferative diabetic retinopathy without macular edema, unspecified eye: Secondary | ICD-10-CM | POA: Diagnosis not present

## 2020-01-06 DIAGNOSIS — E1121 Type 2 diabetes mellitus with diabetic nephropathy: Secondary | ICD-10-CM | POA: Diagnosis not present

## 2020-01-06 DIAGNOSIS — E1142 Type 2 diabetes mellitus with diabetic polyneuropathy: Secondary | ICD-10-CM | POA: Diagnosis not present

## 2020-01-06 DIAGNOSIS — M47812 Spondylosis without myelopathy or radiculopathy, cervical region: Secondary | ICD-10-CM | POA: Diagnosis not present

## 2020-01-06 DIAGNOSIS — F339 Major depressive disorder, recurrent, unspecified: Secondary | ICD-10-CM | POA: Diagnosis not present

## 2020-01-07 ENCOUNTER — Encounter: Payer: Self-pay | Admitting: Podiatry

## 2020-01-07 NOTE — Progress Notes (Signed)
Radiation Oncology         [phone] ________________________________  Initial Outpatient Consultation  Name: [name] MRN: [id number]  Date: 01/08/2020  DOB: March 16, 1951  CC:[name], MD  Coralie Keens, MD   REFERRING PHYSICIAN: Coralie Keens, MD  DIAGNOSIS:    ICD-10-CM   1. Ductal carcinoma in situ (DCIS) of left breast  D05.12     Cancer Staging Ductal carcinoma in situ (DCIS) of left breast Staging form: Breast, AJCC 8th Edition - Clinical stage from 01/08/2020: Stage 0 (cTis (DCIS), cN0, cM0, ER-, PR-, HER2: Not Assessed) - Signed by Nicholas Lose, MD on 01/08/2020   Stage 0 Left Breast LIQ, Ductal Carcinoma In Situ, ER- / PR-, Grade 3  CHIEF COMPLAINT: Here to discuss management of left breast DCIS  HISTORY OF PRESENT ILLNESS::[name] is a 69 y.o. female who presented with left breast abnormality on the following imaging: bilateral screening mammogram on the date of 11/08/2019. No symptoms were reported at that time. Unilateral diagnostic mammogram on the date of 11/19/2019 revealed two indeterminate groups of heterogeneous calcifications in the lower inner quadrant of the left breast at posterior depth. Biopsy on the date of 12/30/2019 showed ductal carcinoma in situ with apocrine features and calcifications. ER status: 0%; PR status: 0%; Grade 3.  She was discussed at tumor board today.  She is considered a good candidate for breast conserving surgery.  She is otherwise in her usual state of health.  She is a retired Pharmacist, hospital.  PREVIOUS RADIATION THERAPY: No  PAST MEDICAL HISTORY:  has a past medical history of CKD (chronic kidney disease) (06/07/2016), Diabetic polyneuropathy (06/07/2016), Dyslipidemia, Endometrial adenocarcinoma, Esophageal reflux, Fever blister, Hyperlipidemia, Hypertension, essential (06/07/2016), Major depressive disorder, Mixed dyslipidemia (06/07/2016), Obstructive sleep apnea (06/07/2016), Peripheral neuropathy,  Retinopathy, Severe obesity, Skin ulcer of left foot with fat layer exposed (11/02/2016), Stroke, Tachycardia (07/19/2017), Type 2 diabetes mellitus with hyperglycemia, with long-term current use of insulin (06/07/2016), and Unilateral primary osteoarthritis, right knee (04/16/2019).    PAST SURGICAL HISTORY: Past Surgical History:  Procedure Laterality Date   ADRENALECTOMY     BUNIONECTOMY     CARDIAC CATHETERIZATION     CERVICAL SPINE SURGERY     CHOLECYSTECTOMY     LAPAROSCOPIC SALPINGOOPHERECTOMY     THORACOTOMY     TONSILLECTOMY      FAMILY HISTORY: family history includes Alzheimer's disease in her father and mother; Bladder Cancer in her father; Breast cancer (age of onset: 73) in her paternal aunt; CAD in her father; Diverticulitis in her sister; Heart attack in her father; Hypertension in her mother and sister; Obesity in her sister; Prostate cancer in her paternal uncle; Voice disorder in her brother.  SOCIAL HISTORY:  reports that she has never smoked. She has never used smokeless tobacco. She reports that she does not drink alcohol and does not use drugs.  ALLERGIES: Ciprofloxacin, Penicillins, Sulfa antibiotics, and Sulfamethoxazole  MEDICATIONS:  Current Outpatient Medications  Medication Sig Dispense Refill   aspirin EC 81 MG tablet Take 81 mg by mouth daily. Swallow whole.     buPROPion (WELLBUTRIN XL) 300 MG 24 hr tablet Take 300 mg by mouth daily.     carvedilol (COREG) 6.25 MG tablet Take 1 tablet (6.25 mg total) by mouth 2 (two) times daily. 180 tablet 1   doxycycline (VIBRA-TABS) 100 MG tablet Take 1 tablet (100 mg total) by mouth 2 (two) times daily. 20 tablet 0   FLUoxetine (PROZAC) 40 MG capsule Take 40 mg  by mouth daily.   0   insulin NPH-regular Human (NOVOLIN 70/30) (70-30) 100 UNIT/ML injection Inject into the skin. 24 ml in the morning and 20 ml in the evening     Inulin (FIBER CHOICE PO) Take by mouth.     Lancets (ONETOUCH DELICA PLUS  [id number]) MISC USE AS DIRECTED 3 TIMES A DAY     losartan (COZAAR) 100 MG tablet Take 100 mg by mouth daily.      metFORMIN (GLUCOPHAGE-XR) 500 MG 24 hr tablet Take 1,000 mg by mouth in the morning and at bedtime. PT TAKES 2000 MG DAILY     MONOJECT INSULIN SYRINGE 31G X 5/16" 1 ML MISC See admin instructions.     mupirocin ointment (BACTROBAN) 2 % Apply 1 application topically 2 (two) times daily. 30 g 2   naproxen (NAPROSYN) 250 MG tablet Take 1 tablet by mouth in the morning and at bedtime.     nystatin cream (MYCOSTATIN) 1 APPLICATION TWICE A DAY EXTERNALLY     ONETOUCH VERIO test strip      pravastatin (PRAVACHOL) 40 MG tablet Take 1 tablet once a day for cholesterol     SODIUM CHLORIDE, EXTERNAL, (SALINE WOUND [place]) 0.9 % SOLN Cleanse left foot ulcer with saline once daily. 210 mL 0   No current facility-administered medications for this encounter.    REVIEW OF SYSTEMS: As above.   All others are negative.   PHYSICAL EXAM:  vitals were not taken for this visit.   General: Alert and oriented, in no acute distress. Breasts: Notable for firmness and bruising in the lower inner quadrant of the left breast consistent with biopsy changes.  no other palpable masses appreciated in the breasts or axillae bilaterally.   ECOG = 1  0 - Asymptomatic (Fully active, able to carry on all predisease activities without restriction)  1 - Symptomatic but completely ambulatory (Restricted in physically strenuous activity but ambulatory and able to carry out work of a light or sedentary nature. For example, light housework, office work)  2 - Symptomatic, <50% in bed during the day (Ambulatory and capable of all self care but unable to carry out any work activities. Up and about more than 50% of waking hours)  3 - Symptomatic, >50% in bed, but not bedbound (Capable of only limited self-care, confined to bed or chair 50% or more of waking hours)  4 - Bedbound (Completely disabled. Cannot carry  on any self-care. Totally confined to bed or chair)  5 - Death   Eustace Pen MM, Creech RH, Tormey DC, et al. [phone]). "Toxicity and response criteria of the [facility]". [place] Oncol. 5 (6): 649-55   LABORATORY DATA:  Lab Results  Component Value Date   WBC 9.0 01/08/2020   HGB 12.1 01/08/2020   HCT 37.5 01/08/2020   MCV 100.0 01/08/2020   PLT 280 01/08/2020   CMP     Component Value Date/Time   NA 136 01/08/2020 1051   K 4.6 01/08/2020 1051   CL 103 01/08/2020 1051   CO2 26 01/08/2020 1051   GLUCOSE 180 (H) 01/08/2020 1051   BUN 24 (H) 01/08/2020 1051   CREATININE 1.25 (H) 01/08/2020 1051   CREATININE 0.87 01/03/2017 1612   CALCIUM 10.0 01/08/2020 1051   PROT 7.6 01/08/2020 1051   ALBUMIN 3.6 01/08/2020 1051   AST 18 01/08/2020 1051   ALT 20 01/08/2020 1051   ALKPHOS 56 01/08/2020 1051   BILITOT 0.3 01/08/2020 1051  GFRNONAA 44 (L) 01/08/2020 1051   GFRNONAA 70 01/03/2017 1612   GFRAA 81 01/03/2017 1612         RADIOGRAPHY: As above   IMPRESSION/PLAN: Left breast DCIS  She has been discussed at our multidisciplinary tumor board.  The consensus is that she would be a good candidate for breast conservation. I talked to her about the option of a mastectomy and informed her that her expected overall survival would be equivalent between mastectomy and breast conservation, based upon randomized controlled data. She is enthusiastic about breast conservation.  It was a pleasure meeting the patient today. We discussed the risks, benefits, and side effects of radiotherapy. I recommend radiotherapy to the left breast to reduce her risk of locoregional recurrence by half.  We discussed that radiation would take approximately 4 weeks to complete and that I would give the patient a few weeks to heal following surgery before starting treatment planning. We spoke about acute effects including skin irritation and fatigue as well as much less common late effects  including internal organ injury or irritation. We spoke about the latest technology that is used to minimize the risk of late effects for patients undergoing radiotherapy to the breast or chest wall. No guarantees of treatment were given. The patient is enthusiastic about proceeding with treatment. I look forward to participating in the patient's care.  I will await her referral back to me for postoperative follow-up and eventual CT simulation/treatment planning.  We discussed measures to reduce the risk of infection during the COVID-19 pandemic.  She has not yet received the vaccine.  I talked about the risks of Covid and the relatively small risks of the vaccine in comparison to potential long-term consequences of the virus itself.  Given her comorbidities, she could potentially benefit from the vaccine more than most people.  That said, she does not feel compelled to receive the vaccine at this time.  I encouraged her to talk to any of her healthcare providers if she changes her mind.  We can provide the Pfizer vaccine to her here if she wishes.  On date of service, in total, I spent 35 minutes on this encounter. Patient was seen in person.   __________________________________________   Eppie Gibson, MD  This document serves as a record of services personally performed by Eppie Gibson, MD. It was created on his behalf by Clerance Lav, a trained medical scribe. The creation of this record is based on the scribe's personal observations and the provider's statements to them. This document has been checked and approved by the attending provider.

## 2020-01-07 NOTE — Progress Notes (Signed)
[facility] NOTE  Patient Care Team: [name], MD as PCP - General (Family Medicine) Debara Pickett Nadean Corwin, MD as PCP - Cardiology (Cardiology) Cameron Sprang, MD as Consulting Physician (Neurology) Rockwell Germany, RN as Oncology Nurse Navigator Mauro Kaufmann, RN as Oncology Nurse Navigator  CHIEF COMPLAINTS/PURPOSE OF CONSULTATION:  Newly diagnosed breast cancer  HISTORY OF PRESENTING ILLNESS:  [name] 69 y.o. female is here because of recent diagnosis of ductal carcinoma in situ of the left breast. Screening mammogram on 11/08/19 showed left breast calcifications. Mammogram on 11/19/19 showed two groups of calcifications in the inferior medial quadrant of the left breast, 0.5cm and 0.9cm. Biopsy on 12/30/19 showed ductal carcinoma in situ, grade 3, ER/PR negative. She presents to the clinic today for initial evaluation and discussion of treatment options.   I reviewed her records extensively and collaborated the history with the patient.  SUMMARY OF ONCOLOGIC HISTORY: Oncology History  Ductal carcinoma in situ (DCIS) of left breast  01/02/2020 Initial Diagnosis   Screening mammogram showed two groups of calcifications in the inferior medial quadrant of the left breast, 0.5cm and 0.9cm. Biopsy showed DCIS, grade 3, ER/PR negative.    01/08/2020 Cancer Staging   Staging form: Breast, AJCC 8th Edition - Clinical stage from 01/08/2020: Stage 0 (cTis (DCIS), cN0, cM0, ER-, PR-, HER2: Not Assessed) - Signed by Nicholas Lose, MD on 01/08/2020     MEDICAL HISTORY:  Past Medical History:  Diagnosis Date  . CKD (chronic kidney disease) 06/07/2016   Stage 2, GFR 60-89 ml/min  . Diabetic polyneuropathy 06/07/2016  . Dyslipidemia   . Endometrial adenocarcinoma   . Esophageal reflux   . Fever blister   . Hyperlipidemia   . Hypertension, essential 06/07/2016  . Major depressive disorder   . Mixed dyslipidemia 06/07/2016  . Obstructive sleep apnea  06/07/2016  . Peripheral neuropathy   . Retinopathy   . Severe obesity   . Skin ulcer of left foot with fat layer exposed 11/02/2016  . Stroke    Asymptomatic, discovered via neuroimaging; small infarct in left parietal lobe; also concern for small b/l frontal infarcts  . Tachycardia 07/19/2017  . Type 2 diabetes mellitus with hyperglycemia, with long-term current use of insulin 06/07/2016  . Unilateral primary osteoarthritis, right knee 04/16/2019    SURGICAL HISTORY: Past Surgical History:  Procedure Laterality Date  . ADRENALECTOMY    . BUNIONECTOMY    . CARDIAC CATHETERIZATION    . CERVICAL SPINE SURGERY    . CHOLECYSTECTOMY    . LAPAROSCOPIC SALPINGOOPHERECTOMY    . THORACOTOMY    . TONSILLECTOMY      SOCIAL HISTORY: Social History   Socioeconomic History  . Marital status: Single    Spouse name: Not on file  . Number of children: Not on file  . Years of education: 14  . Highest education level: Bachelor's degree (e.g., BA, AB, BS)  Occupational History  . Occupation: Retired    Comment: Pharmacist, hospital  Tobacco Use  . Smoking status: Never Smoker  . Smokeless tobacco: Never Used  Substance and Sexual Activity  . Alcohol use: No  . Drug use: No  . Sexual activity: Not on file  Other Topics Concern  . Not on file  Social History Narrative   Right Handed   Lives in one story home   Does not drink caffeine   Social Determinants of Health   Financial Resource Strain:   . Difficulty of Paying Living Expenses:  Not on file  Food Insecurity:   . Worried About Charity fundraiser in the Last Year: Not on file  . Ran Out of Food in the Last Year: Not on file  Transportation Needs:   . Lack of Transportation (Medical): Not on file  . Lack of Transportation (Non-Medical): Not on file  Physical Activity:   . Days of Exercise per Week: Not on file  . Minutes of Exercise per Session: Not on file  Stress:   . Feeling of Stress : Not on file  Social Connections:   . Frequency  of Communication with Friends and Family: Not on file  . Frequency of Social Gatherings with Friends and Family: Not on file  . Attends Religious Services: Not on file  . Active Member of Clubs or Organizations: Not on file  . Attends Archivist Meetings: Not on file  . Marital Status: Not on file  Intimate Partner Violence:   . Fear of Current or Ex-Partner: Not on file  . Emotionally Abused: Not on file  . Physically Abused: Not on file  . Sexually Abused: Not on file    FAMILY HISTORY: Family History  Problem Relation Age of Onset  . Hypertension Mother   . Alzheimer's disease Mother   . Heart attack Father   . Cancer Father        bladder  . CAD Father   . Alzheimer's disease Father   . Diverticulitis Sister   . Obesity Sister   . Hypertension Sister   . Voice disorder Brother   . Breast cancer Paternal Aunt     ALLERGIES:  is allergic to ciprofloxacin, penicillins, sulfa antibiotics, and sulfamethoxazole.  MEDICATIONS:  Current Outpatient Medications  Medication Sig Dispense Refill  . buPROPion (WELLBUTRIN XL) 300 MG 24 hr tablet Take 300 mg by mouth daily.    . carvedilol (COREG) 6.25 MG tablet Take 1 tablet (6.25 mg total) by mouth 2 (two) times daily. 180 tablet 1  . FLUoxetine (PROZAC) 40 MG capsule Take 40 mg by mouth daily.   0  . insulin NPH-regular Human (NOVOLIN 70/30) (70-30) 100 UNIT/ML injection Inject into the skin. 24 ml in the morning and 20 ml in the evening    . Inulin (FIBER CHOICE PO) Take by mouth.    . Lancets (ONETOUCH DELICA PLUS [id number]) MISC USE AS DIRECTED 3 TIMES A DAY    . losartan (COZAAR) 100 MG tablet Take 100 mg by mouth daily.     . metFORMIN (GLUCOPHAGE-XR) 500 MG 24 hr tablet Take 1,000 mg by mouth in the morning and at bedtime. PT TAKES 2000 MG DAILY    . MONOJECT INSULIN SYRINGE 31G X 5/16" 1 ML MISC See admin instructions.    . mupirocin ointment (BACTROBAN) 2 % Apply 1 application topically 2 (two) times daily. 30 g 2   . naproxen (NAPROSYN) 250 MG tablet Take 1 tablet by mouth in the morning and at bedtime.    Glory Rosebush VERIO test strip     . pravastatin (PRAVACHOL) 40 MG tablet Take 1 tablet once a day for cholesterol    . aspirin EC 81 MG tablet Take 81 mg by mouth daily. Swallow whole.    Marland Kitchen doxycycline (VIBRA-TABS) 100 MG tablet Take 1 tablet (100 mg total) by mouth 2 (two) times daily. 20 tablet 0  . nystatin cream (MYCOSTATIN) 1 APPLICATION TWICE A DAY EXTERNALLY    . SODIUM CHLORIDE, EXTERNAL, (SALINE WOUND [place]) 0.9 % SOLN  Cleanse left foot ulcer with saline once daily. 210 mL 0   No current facility-administered medications for this visit.    REVIEW OF SYSTEMS:   All other systems were reviewed with the patient and are negative.  PHYSICAL EXAMINATION: ECOG PERFORMANCE STATUS: 1 - Symptomatic but completely ambulatory  Vitals:   01/08/20 1256  BP: (!) 121/48  Pulse: 72  Resp: 18  Temp: 98.6 F (37 C)  SpO2: 95%   Filed Weights   01/08/20 1256  Weight: 265 lb 6.4 oz (120.4 kg)     LABORATORY DATA:  I have reviewed the data as listed Lab Results  Component Value Date   WBC 9.0 01/08/2020   HGB 12.1 01/08/2020   HCT 37.5 01/08/2020   MCV 100.0 01/08/2020   PLT 280 01/08/2020   Lab Results  Component Value Date   NA 136 01/08/2020   K 4.6 01/08/2020   CL 103 01/08/2020   CO2 26 01/08/2020    RADIOGRAPHIC STUDIES: I have personally reviewed the radiological reports and agreed with the findings in the report.  ASSESSMENT AND PLAN:  Ductal carcinoma in situ (DCIS) of left breast 01/02/2020: Screening mammogram showed two groups of calcifications in the inferior medial quadrant of the left breast, 0.5cm and 0.9cm. Biopsy showed DCIS, grade 3, ER/PR negative.   Pathology review: I discussed with the patient the difference between DCIS and invasive breast cancer. It is considered a precancerous lesion. DCIS is classified as a 0. It is generally detected through mammograms as  calcifications. We discussed the significance of grades and its impact on prognosis. We also discussed the importance of ER and PR receptors and their implications to adjuvant treatment options. Prognosis of DCIS dependence on grade, comedo necrosis. It is anticipated that if not treated, 20-30% of DCIS can develop into invasive breast cancer.  Recommendation: 1. Breast conserving surgery 2. Followed by adjuvant radiation therapy No role of antiestrogen therapy since she is ER/PR negative.  Return to clinic after surgery to discuss the final pathology report     All questions were answered. The patient knows to call the clinic with any problems, questions or concerns.   Vinay K Gudena, MD, MPH 01/08/2020    I, Molly Dorshimer, am acting as scribe for Vinay Gudena, MD.  I have reviewed the above documentation for accuracy and completeness, and I agree with the above.   

## 2020-01-07 NOTE — Progress Notes (Signed)
Subjective:  [name], female    DOB: Nov 01, 1950,  MRN: [id number]  69 y.o. female is seen today for evaluation of left foot.  Prescribed wound care regimen: Mupirocin Ointment dressing daily.  Patient is using offloaded Darco shoe.  Patient relates wound has significantly improved.  She is having no constitutional symptoms, denying fever, chills, anorexia, or weight loss..  She states she will be having a work up of her left breast and has some appointments for that coming up.   Past Medical History:  Diagnosis Date  . CKD (chronic kidney disease) 06/07/2016   Stage 2, GFR 60-89 ml/min  . Diabetic polyneuropathy 06/07/2016  . Dyslipidemia   . Endometrial adenocarcinoma   . Esophageal reflux   . Fever blister   . Hyperlipidemia   . Hypertension, essential 06/07/2016  . Major depressive disorder   . Mixed dyslipidemia 06/07/2016  . Obstructive sleep apnea 06/07/2016  . Peripheral neuropathy   . Retinopathy   . Severe obesity   . Skin ulcer of left foot with fat layer exposed 11/02/2016  . Stroke    Asymptomatic, discovered via neuroimaging; small infarct in left parietal lobe; also concern for small b/l frontal infarcts  . Tachycardia 07/19/2017  . Type 2 diabetes mellitus with hyperglycemia, with long-term current use of insulin 06/07/2016  . Unilateral primary osteoarthritis, right knee 04/16/2019     Past Surgical History:  Procedure Laterality Date  . ADRENALECTOMY    . BUNIONECTOMY    . CARDIAC CATHETERIZATION    . CERVICAL SPINE SURGERY    . CHOLECYSTECTOMY    . LAPAROSCOPIC SALPINGOOPHERECTOMY    . THORACOTOMY    . TONSILLECTOMY       Current Outpatient Medications on File Prior to Visit  Medication Sig Dispense Refill  . amitriptyline (ELAVIL) 10 MG tablet Take 1 tablet (10 mg total) by mouth at bedtime. (Patient not taking: Reported on 11/04/2019) 20 tablet 0  . Ascorbic Acid (VITAMIN C) 100 MG tablet Take 100 mg by mouth daily. (Patient not  taking: Reported on 11/04/2019)    . aspirin EC 81 MG tablet Take 81 mg by mouth daily. Swallow whole.    Marland Kitchen buPROPion (WELLBUTRIN XL) 300 MG 24 hr tablet Take 300 mg by mouth daily.    . carvedilol (COREG) 6.25 MG tablet Take 1 tablet (6.25 mg total) by mouth 2 (two) times daily. 180 tablet 1  . Cholecalciferol (VITAMIN D3) 1.25 MG (50000 UT) TABS Take by mouth. (Patient not taking: Reported on 11/04/2019)    . doxycycline (VIBRA-TABS) 100 MG tablet Take 1 tablet (100 mg total) by mouth 2 (two) times daily. 20 tablet 0  . FLUoxetine (PROZAC) 40 MG capsule Take 40 mg by mouth daily.   0  . furosemide (LASIX) 40 MG tablet Take 1 tablet (40 mg total) by mouth daily. (Patient not taking: Reported on 11/04/2019) 90 tablet 3  . insulin NPH-regular Human (NOVOLIN 70/30) (70-30) 100 UNIT/ML injection Inject into the skin. 24 ml in the morning and 20 ml in the evening    . Inulin (FIBER CHOICE PO) Take by mouth.    . Lancets (ONETOUCH DELICA PLUS [id number]) MISC USE AS DIRECTED 3 TIMES A DAY    . losartan (COZAAR) 100 MG tablet Take 100 mg by mouth daily.     . magnesium citrate SOLN Take 1 Bottle by mouth once. (Patient not taking: Reported on 11/04/2019)    . metFORMIN (GLUCOPHAGE-XR) 500 MG 24 hr tablet Take 1,000  mg by mouth in the morning and at bedtime. PT TAKES 2000 MG DAILY    . MONOJECT INSULIN SYRINGE 31G X 5/16" 1 ML MISC See admin instructions.    . mupirocin ointment (BACTROBAN) 2 % Apply 1 application topically 2 (two) times daily. 30 g 2  . naproxen (NAPROSYN) 250 MG tablet Take 1 tablet by mouth in the morning and at bedtime.    Marland Kitchen nystatin cream (MYCOSTATIN) 1 APPLICATION TWICE A DAY EXTERNALLY    . ONETOUCH VERIO test strip     . pravastatin (PRAVACHOL) 40 MG tablet Take 1 tablet once a day for cholesterol    . SODIUM CHLORIDE, EXTERNAL, (SALINE WOUND [place]) 0.9 % SOLN Cleanse left foot ulcer with saline once daily. 210 mL 0  . zinc sulfate 220 (50 Zn) MG capsule Take 220 mg by mouth daily.  (Patient not taking: Reported on 11/04/2019)     No current facility-administered medications on file prior to visit.     Allergies  Allergen Reactions  . Ciprofloxacin Other (See Comments)    Unknown  . Penicillins Other (See Comments)    Unknown  . Sulfa Antibiotics   . Sulfamethoxazole Other (See Comments)    Unknown     Family History  Problem Relation Age of Onset  . Hypertension Mother   . Alzheimer's disease Mother   . Heart attack Father   . Cancer Father   . CAD Father   . Alzheimer's disease Father   . Diverticulitis Sister   . Obesity Sister   . Hypertension Sister   . Voice disorder Brother      Social History   Occupational History  . Occupation: Retired    Comment: Pharmacist, hospital  Tobacco Use  . Smoking status: Never Smoker  . Smokeless tobacco: Never Used  Substance and Sexual Activity  . Alcohol use: No  . Drug use: No  . Sexual activity: Not on file     Immunization History  Administered Date(s) Administered  . Tdap 05/14/2019    Objective:  Physical Exam: There were no vitals filed for this visit.   [name] is a pleasant 69 y.o. Caucasian female morbidly obese in NAD. AAO x 3.  Vascular Examination: Capillary fill time to digits <3 seconds b/l lower extremities. Palpable DP pulse(s) b/l lower extremities Palpable PT pulse(s) b/l lower extremities Pedal hair absent. Lower extremity skin temperature gradient within normal limits. No pain with calf compression b/l.  Dermatological Examination: Pedal skin with normal turgor, texture and tone bilaterally. No open wounds bilaterally. No interdigital macerations bilaterally. Toenails 1-5 b/l well maintained with adequate length. No erythema, no edema, no drainage, no flocculence. Ulcer submet head 1 left foot completely epitheliallized with no edema, no erythema, no drainage and no fluctuance.   Musculoskeletal Examination: Normal muscle strength 5/5 to all lower extremity muscle groups  bilaterally. No pain crepitus or joint limitation noted with ROM b/l. Hallux valgus with bunion deformity noted b/l lower extremities. Hammertoes noted to the L 2nd toe. Lower extremity amputation(s): digital amputation L 3rd toe and R hallux. Utilizes cane for ambulation assistance.  Neurological Examination: Protective sensation diminished with 10g monofilament b/l.  Assessment:  1. Healed diabetic foot ulcer 2. Status post amputation of right great toe ([place]) 3. Status post amputation of lesser toe of left foot ([place]) 4. Diabetic peripheral neuropathy associated with type 2 diabetes mellitus ([place])  Plan:  -Patient was evaluated and treated and all questions answered.  -Patient/POA/Family member educated on diagnosis  and treatment plan of routine ulcer debridement/wound care.  -Ulceration debridement achieved utilizing sharp excisional debridement with sterile scalpel blade.. Type/amount of devitalized tissue removed: nonviable hyperkeratosis -Today's ulcer submet head 1 left foot has completely resolved. -Ulceration cleansed with wound cleanser. triple antibiotic ointment applied to base of ulceration and secured with light dressing. -Wound responded well to today's debridement. -Patient risk factors affecting healing of ulcer: diabetes, diabetic neuropathy, foot deformity, recurrence -Patient to report any pedal injuries to medical professional immediately. -Patient to continue soft, supportive shoe gear daily. -Patient/POA to call should there be question/concern in the interim.  Return in about 2 weeks (around 01/17/2020).  Marzetta Board, DPM

## 2020-01-08 ENCOUNTER — Inpatient Hospital Stay (HOSPITAL_BASED_OUTPATIENT_CLINIC_OR_DEPARTMENT_OTHER): Payer: PPO | Admitting: Hematology and Oncology

## 2020-01-08 ENCOUNTER — Inpatient Hospital Stay (HOSPITAL_BASED_OUTPATIENT_CLINIC_OR_DEPARTMENT_OTHER): Payer: PPO | Admitting: Genetic Counselor

## 2020-01-08 ENCOUNTER — Encounter: Payer: Self-pay | Admitting: *Deleted

## 2020-01-08 ENCOUNTER — Ambulatory Visit
Admission: RE | Admit: 2020-01-08 | Discharge: 2020-01-08 | Disposition: A | Discharge status: Home / Self Care | Payer: PPO | Source: Ambulatory Visit | Attending: Radiation Oncology | Admitting: Radiation Oncology

## 2020-01-08 ENCOUNTER — Encounter: Payer: Self-pay | Admitting: Genetic Counselor

## 2020-01-08 ENCOUNTER — Ambulatory Visit: Payer: PPO | Admitting: Physical Therapy

## 2020-01-08 ENCOUNTER — Other Ambulatory Visit: Payer: Self-pay

## 2020-01-08 ENCOUNTER — Other Ambulatory Visit: Payer: Self-pay | Admitting: Surgery

## 2020-01-08 ENCOUNTER — Inpatient Hospital Stay: Payer: PPO | Attending: Hematology and Oncology

## 2020-01-08 DIAGNOSIS — Z8052 Family history of malignant neoplasm of bladder: Secondary | ICD-10-CM

## 2020-01-08 DIAGNOSIS — Z8542 Personal history of malignant neoplasm of other parts of uterus: Secondary | ICD-10-CM

## 2020-01-08 DIAGNOSIS — D0512 Intraductal carcinoma in situ of left breast: Secondary | ICD-10-CM

## 2020-01-08 DIAGNOSIS — Z803 Family history of malignant neoplasm of breast: Secondary | ICD-10-CM | POA: Insufficient documentation

## 2020-01-08 DIAGNOSIS — Z171 Estrogen receptor negative status [ER-]: Secondary | ICD-10-CM | POA: Diagnosis not present

## 2020-01-08 DIAGNOSIS — Z8042 Family history of malignant neoplasm of prostate: Secondary | ICD-10-CM

## 2020-01-08 LAB — CBC WITH DIFFERENTIAL (CANCER CENTER ONLY)
Abs Immature Granulocytes: 0.1 10*3/uL — ABNORMAL HIGH (ref 0.00–0.07)
Basophils Absolute: 0.1 10*3/uL (ref 0.0–0.1)
Basophils Relative: 1 %
Eosinophils Absolute: 0.3 10*3/uL (ref 0.0–0.5)
Eosinophils Relative: 3 %
HCT: 37.5 % (ref 36.0–46.0)
Hemoglobin: 12.1 g/dL (ref 12.0–15.0)
Immature Granulocytes: 1 %
Lymphocytes Relative: 29 %
Lymphs Abs: 2.6 10*3/uL (ref 0.7–4.0)
MCH: 32.3 pg (ref 26.0–34.0)
MCHC: 32.3 g/dL (ref 30.0–36.0)
MCV: 100 fL (ref 80.0–100.0)
Monocytes Absolute: 0.9 10*3/uL (ref 0.1–1.0)
Monocytes Relative: 10 %
Neutro Abs: 5.1 10*3/uL (ref 1.7–7.7)
Neutrophils Relative %: 56 %
Platelet Count: 280 10*3/uL (ref 150–400)
RBC: 3.75 MIL/uL — ABNORMAL LOW (ref 3.87–5.11)
RDW: 13.7 % (ref 11.5–15.5)
WBC Count: 9 10*3/uL (ref 4.0–10.5)
nRBC: 0 % (ref 0.0–0.2)

## 2020-01-08 LAB — CMP (CANCER CENTER ONLY)
ALT: 20 U/L (ref 0–44)
AST: 18 U/L (ref 15–41)
Albumin: 3.6 g/dL (ref 3.5–5.0)
Alkaline Phosphatase: 56 U/L (ref 38–126)
Anion gap: 7 (ref 5–15)
BUN: 24 mg/dL — ABNORMAL HIGH (ref 8–23)
CO2: 26 mmol/L (ref 22–32)
Calcium: 10 mg/dL (ref 8.9–10.3)
Chloride: 103 mmol/L (ref 98–111)
Creatinine: 1.25 mg/dL — ABNORMAL HIGH (ref 0.44–1.00)
GFR, Estimated: 44 mL/min — ABNORMAL LOW (ref >60)
Glucose, Bld: 180 mg/dL — ABNORMAL HIGH (ref 70–99)
Potassium: 4.6 mmol/L (ref 3.5–5.1)
Sodium: 136 mmol/L (ref 135–145)
Total Bilirubin: 0.3 mg/dL (ref 0.3–1.2)
Total Protein: 7.6 g/dL (ref 6.5–8.1)

## 2020-01-08 LAB — GENETIC SCREENING ORDER

## 2020-01-08 NOTE — Progress Notes (Signed)
REFERRING PROVIDER: Nicholas Lose, MD [place],  [place] [address]  PRIMARY PROVIDER:  [name], MD  PRIMARY REASON FOR VISIT:  1. Ductal carcinoma in situ (DCIS) of left breast   2. Family history of breast cancer   3. Family history of prostate cancer   4. Family history of bladder cancer   5. History of uterine cancer    HISTORY OF PRESENT ILLNESS:   [name], a 69 y.o. female, was seen for a [place] cancer genetics consultation at the request of Dr. Lindi Adie due to a personal history of ductal carcinoma in situ (DCIS) and uterine cancer and family history of breast, prostate, and bladder cancers.  [name] presents to clinic today to discuss the possibility of a hereditary predisposition to cancer, genetic testing, and to further clarify her future cancer risks, as well as potential cancer risks for family members.   In October 2021, at the age of 29, [name] was diagnosed with DCIS of the left breast.  The preliminary treatment plan includes breast conserving surgery and adjuvant radiation. In 2012, at the age of 4, [name] was diagnosed with uterine cancer.  The treatment plan included robotic-assisted total hysterectomy and bilateral salpingo-oophorectomy. In 2020, at the age of 30, [name] was diagnosed with basal cell carcinoma, which was surgically removed.   CANCER HISTORY:  Oncology History  Ductal carcinoma in situ (DCIS) of left breast  01/02/2020 Initial Diagnosis   Screening mammogram showed two groups of calcifications in the inferior medial quadrant of the left breast, 0.5cm and 0.9cm. Biopsy showed DCIS, grade 3, ER/PR negative.    01/08/2020 Cancer Staging   Staging form: Breast, AJCC 8th Edition - Clinical stage from 01/08/2020: Stage 0 (cTis (DCIS), cN0, cM0, ER-, PR-, HER2: Not Assessed) - Signed by Nicholas Lose, MD on 01/08/2020      RISK FACTORS:  Menarche was at age 61.  Nulliparous.  OCP use for approximately 0  years.  Ovaries intact: no. See history noted above Hysterectomy: yes. See history noted above Menopausal status: postmenopausal.  HRT use: 0 years. Colonoscopy: yes; most recent 10 years ago per patient. Mammogram within the last year: yes. Up to date with pelvic exams: most recent PAP in 73s per patient.  Past Medical History:  Diagnosis Date  . CKD (chronic kidney disease) 06/07/2016   Stage 2, GFR 60-89 ml/min  . Diabetic polyneuropathy 06/07/2016  . Dyslipidemia   . Endometrial adenocarcinoma   . Esophageal reflux   . Fever blister   . Hyperlipidemia   . Hypertension, essential 06/07/2016  . Major depressive disorder   . Mixed dyslipidemia 06/07/2016  . Obstructive sleep apnea 06/07/2016  . Peripheral neuropathy   . Retinopathy   . Severe obesity   . Skin ulcer of left foot with fat layer exposed 11/02/2016  . Stroke    Asymptomatic, discovered via neuroimaging; small infarct in left parietal lobe; also concern for small b/l frontal infarcts  . Tachycardia 07/19/2017  . Type 2 diabetes mellitus with hyperglycemia, with long-term current use of insulin 06/07/2016  . Unilateral primary osteoarthritis, right knee 04/16/2019    Past Surgical History:  Procedure Laterality Date  . ADRENALECTOMY    . BUNIONECTOMY    . CARDIAC CATHETERIZATION    . CERVICAL SPINE SURGERY    . CHOLECYSTECTOMY    . LAPAROSCOPIC SALPINGOOPHERECTOMY    . THORACOTOMY    . TONSILLECTOMY      Social History   Socioeconomic History  .  Marital status: Single    Spouse name: Not on file  . Number of children: Not on file  . Years of education: 34  . Highest education level: Bachelor's degree (e.g., BA, AB, BS)  Occupational History  . Occupation: Retired    Comment: Pharmacist, hospital  Tobacco Use  . Smoking status: Never Smoker  . Smokeless tobacco: Never Used  Substance and Sexual Activity  . Alcohol use: No  . Drug use: No  . Sexual activity: Not on file  Other Topics Concern  . Not on file    Social History Narrative   Right Handed   Lives in one story home   Does not drink caffeine   Social Determinants of Health   Financial Resource Strain:   . Difficulty of Paying Living Expenses: Not on file  Food Insecurity:   . Worried About Charity fundraiser in the Last Year: Not on file  . Ran Out of Food in the Last Year: Not on file  Transportation Needs:   . Lack of Transportation (Medical): Not on file  . Lack of Transportation (Non-Medical): Not on file  Physical Activity:   . Days of Exercise per Week: Not on file  . Minutes of Exercise per Session: Not on file  Stress:   . Feeling of Stress : Not on file  Social Connections:   . Frequency of Communication with Friends and Family: Not on file  . Frequency of Social Gatherings with Friends and Family: Not on file  . Attends Religious Services: Not on file  . Active Member of Clubs or Organizations: Not on file  . Attends Archivist Meetings: Not on file  . Marital Status: Not on file     FAMILY HISTORY:  We obtained a detailed, 4-generation family history.  Significant diagnoses are listed below: Family History  Problem Relation Age of Onset  . Bladder Cancer Father        dx late 2s  . Breast cancer Paternal Aunt 72  . Prostate cancer Paternal Uncle        dx mid 66s    [name] has one brother and two sisters, all without a history of cancer.  [name] mother passed away at age 54 and did not have cancer.  No maternal family history of cancer was reported.  [name] father passed away at age 1 and had bladder cancer, diagnosed in his late 33s.  [name] paternal uncle, age 30, was diagnosed with prostate cancer in his mid 72s.  [name] paternal aunt, age 77, was diagnosed with breast cancer at age 81.  No other paternal family history of cancer was reported.   [name] is unaware of previous family history of genetic testing for hereditary cancer risks. Patient's maternal ancestors  are of [place] and [place] descent, and paternal ancestors are of [place] and [place] descent. There is no reported Ashkenazi Jewish ancestry. There is no known consanguinity.  GENETIC COUNSELING ASSESSMENT: [name] is a 69 y.o. female with a personal history of DCIS and family history of breast and prostate cancer which is somewhat suggestive of a hereditary cancer syndrome, such as Hereditary Breast and Ovarian Cancer Syndrome, and predisposition to cancer given the related diagnoses of cancer in the family. We, therefore, discussed and recommended the following at today's visit.   DISCUSSION: We discussed that 5 - 10% of cancer is hereditary, with most cases of hereditary breast cancer associated with mutations in BRCA1 and BRCA2.  There are other genes that can be associated with hereditary breast cancer syndromes.  The type of cancer risk and level of risk are gene-specific.  We discussed that testing is beneficial for several reasons including knowing how to follow individuals after completing their treatment, identifying whether potential treatment options would be beneficial, and understanding if other family members could be at risk for cancer and allowing them to undergo genetic testing.   We reviewed the characteristics, features and inheritance patterns of hereditary cancer syndromes. We also discussed genetic testing, including the appropriate family members to test, the process of testing, insurance coverage and turn-around-time for results. We discussed the implications of a negative, positive, carrier and/or variant of uncertain significant result. We recommended [name] pursue genetic testing for a Common Hereditary Cancers Panel.   The Common Hereditary Cancers Panel offered by [facility] includes sequencing and/or deletion duplication testing of the following 48 genes: APC, ATM, AXIN2, BARD1, BMPR1A, BRCA1, BRCA2, BRIP1, CDH1, CDK4, CDKN2A (p14ARF), CDKN2A (p16INK4a), CHEK2, CTNNA1, DICER1,  EPCAM (Deletion/duplication testing only), GREM1 (promoter region deletion/duplication testing only), KIT, MEN1, MLH1, MSH2, MSH3, MSH6, MUTYH, NBN, NF1, NHTL1, PALB2, PDGFRA, PMS2, POLD1, POLE, PTEN, RAD50, RAD51C, RAD51D, RNF43, SDHB, SDHC, SDHD, SMAD4, SMARCA4. STK11, TP53, TSC1, TSC2, and VHL.  The following genes were evaluated for sequence changes only: SDHA and HOXB13 c.251G>A variant only.  Based on [name] personal history of DCIS and family history of breast and prostate cancer, she meets medical criteria for genetic testing. Despite that she meets criteria, she may still have an out of pocket cost.   PLAN: Despite our recommendation, [name] did not wish to pursue genetic testing at today's visit. We understand this decision and remain available to coordinate genetic testing at any time in the future. We, therefore, recommend [name] continue to follow the cancer screening guidelines given by her primary healthcare provider.  Based on [name] family history, we recommended her paternal aunt, who was diagnosed with breast cancer at age 25, have genetic counseling and testing. [name] will let us know if we can be of any assistance in coordinating genetic counseling and/or testing for this family member.   Lastly, we encouraged Ms. Bey to remain in contact with cancer genetics annually so that we can continuously update the family history and inform her of any changes in cancer genetics and testing that may be of benefit for this family.   Ms. Snead questions were answered to her satisfaction today. Our contact information was provided should additional questions or concerns arise. Thank you for the referral and allowing [place] to share in the care of your patient.   Jhania Etherington M. Joette Catching, [place], [facility] Certified Film/video [email or web address] (P) [phone]  The patient was seen for a total of 20 minutes in face-to-face genetic counseling.  This patient was discussed  with Drs. Magrinat, Lindi Adie and/or Burr Medico who agrees with the above.  _______________________________________________________________________ For Office Staff:  Number of people involved in session: 1 Was an Intern/ student involved with case: no

## 2020-01-08 NOTE — Assessment & Plan Note (Signed)
01/02/2020: Screening mammogram showed two groups of calcifications in the inferior medial quadrant of the left breast, 0.5cm and 0.9cm. Biopsy showed DCIS, grade 3, ER/PR negative.   Pathology review: I discussed with the patient the difference between DCIS and invasive breast cancer. It is considered a precancerous lesion. DCIS is classified as a 0. It is generally detected through mammograms as calcifications. We discussed the significance of grades and its impact on prognosis. We also discussed the importance of ER and PR receptors and their implications to adjuvant treatment options. Prognosis of DCIS dependence on grade, comedo necrosis. It is anticipated that if not treated, 20-30% of DCIS can develop into invasive breast cancer.  Recommendation: 1. Breast conserving surgery 2. Followed by adjuvant radiation therapy No role of antiestrogen therapy since she is ER/PR negative.  Return to clinic after surgery to discuss the final pathology report

## 2020-01-09 ENCOUNTER — Telehealth: Payer: Self-pay | Admitting: Hematology and Oncology

## 2020-01-09 ENCOUNTER — Encounter: Payer: Self-pay | Admitting: General Practice

## 2020-01-09 NOTE — Telephone Encounter (Signed)
No 10/20 los, no changes made to pt schedule

## 2020-01-09 NOTE — Progress Notes (Signed)
Dameka rated her distress at a level 1 and stated that she is someone who doesn't get anxious easily.  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  She has good support from family, friends and former students and she has other friends who have gone through cancer treatments.  She and her sisters and brother lost their mother a little over a year ago and have been spending time cleaning out her home.  This has been difficult, but healing to do together.  I provided reflective listening and pastoral presence.  [place], [place] Pager, [phone] 1:31 PM

## 2020-01-10 ENCOUNTER — Encounter: Payer: Self-pay | Admitting: Radiation Oncology

## 2020-01-16 ENCOUNTER — Encounter: Payer: Self-pay | Admitting: *Deleted

## 2020-01-16 ENCOUNTER — Telehealth: Payer: Self-pay | Admitting: *Deleted

## 2020-01-16 DIAGNOSIS — D0512 Intraductal carcinoma in situ of left breast: Secondary | ICD-10-CM

## 2020-01-16 NOTE — Telephone Encounter (Signed)
Spoke to patient to follow up from [facility] last week and assess navigation needs.  No questions or concerns at this time.  Encouraged her to call should anything arise.

## 2020-01-17 ENCOUNTER — Encounter: Payer: Self-pay | Admitting: Podiatry

## 2020-01-17 ENCOUNTER — Ambulatory Visit: Payer: PPO | Admitting: Podiatry

## 2020-01-17 ENCOUNTER — Other Ambulatory Visit: Payer: Self-pay

## 2020-01-17 ENCOUNTER — Telehealth: Payer: Self-pay | Admitting: Hematology and Oncology

## 2020-01-17 DIAGNOSIS — Z89422 Acquired absence of other left toe(s): Secondary | ICD-10-CM

## 2020-01-17 DIAGNOSIS — E1142 Type 2 diabetes mellitus with diabetic polyneuropathy: Secondary | ICD-10-CM | POA: Diagnosis not present

## 2020-01-17 DIAGNOSIS — Z8631 Personal history of diabetic foot ulcer: Secondary | ICD-10-CM

## 2020-01-17 DIAGNOSIS — Z89411 Acquired absence of right great toe: Secondary | ICD-10-CM

## 2020-01-17 NOTE — Telephone Encounter (Signed)
Scheduled appt per 10/28 sch msg - mailed reminder letter with apt date and time

## 2020-01-19 NOTE — Progress Notes (Signed)
Subjective:  [name], female    DOB: 12-14-1950,  MRN: [id number]  69 y.o. female is seen today for evaluation of left foot.  Prescribed wound care regimen: Mupirocin Ointment dressing daily.  She's continuing to use the  offloaded Dar[place] shoe.  Patient relates wound has significantly improved.  She is having no constitutional symptoms, denying fever, chills, anorexia, or weight loss..  She states she will be having a work up of her left breast and has some appointments for that coming up.   Past Medical History:  Diagnosis Date  . CKD (chronic kidney disease) 06/07/2016   Stage 2, GFR 60-89 ml/min  . Diabetic polyneuropathy 06/07/2016  . Dyslipidemia   . Endometrial adenocarcinoma   . Esophageal reflux   . Fever blister   . Hyperlipidemia   . Hypertension, essential 06/07/2016  . Major depressive disorder   . Mixed dyslipidemia 06/07/2016  . Obstructive sleep apnea 06/07/2016  . Peripheral neuropathy   . Retinopathy   . Severe obesity   . Skin ulcer of left foot with fat layer exposed 11/02/2016  . Stroke    Asymptomatic, discovered via neuroimaging; small infarct in left parietal lobe; also concern for small b/l frontal infarcts  . Tachycardia 07/19/2017  . Type 2 diabetes mellitus with hyperglycemia, with long-term current use of insulin 06/07/2016  . Unilateral primary osteoarthritis, right knee 04/16/2019     Past Surgical History:  Procedure Laterality Date  . ADRENALECTOMY    . BUNIONECTOMY    . CARDIAC CATHETERIZATION    . CERVICAL SPINE SURGERY    . CHOLECYSTECTOMY    . LAPAROSCOPIC SALPINGOOPHERECTOMY    . THORACOTOMY    . TONSILLECTOMY       Current Outpatient Medications on File Prior to Visit  Medication Sig Dispense Refill  . aspirin EC 81 MG tablet Take 81 mg by mouth daily. Swallow whole.    Marland Kitchen buPROPion (WELLBUTRIN XL) 300 MG 24 hr tablet Take 300 mg by mouth daily.    . carvedilol (COREG) 6.25 MG tablet Take 1 tablet (6.25 mg total)  by mouth 2 (two) times daily. 180 tablet 1  . doxycycline (VIBRA-TABS) 100 MG tablet Take 1 tablet (100 mg total) by mouth 2 (two) times daily. 20 tablet 0  . FLUoxetine (PROZAC) 40 MG capsule Take 40 mg by mouth daily.   0  . insulin NPH-regular Human (NOVOLIN 70/30) (70-30) 100 UNIT/ML injection Inject into the skin. 24 ml in the morning and 20 ml in the evening    . Inulin (FIBER CHOICE PO) Take by mouth.    . Lancets (ONETOUCH DELICA PLUS [id number]) MISC USE AS DIRECTED 3 TIMES A DAY    . losartan (COZAAR) 100 MG tablet Take 100 mg by mouth daily.     . metFORMIN (GLUCOPHAGE-XR) 500 MG 24 hr tablet Take 1,000 mg by mouth in the morning and at bedtime. PT TAKES 2000 MG DAILY    . MONOJECT INSULIN SYRINGE 31G X 5/16" 1 ML MISC See admin instructions.    . mupirocin ointment (BACTROBAN) 2 % Apply 1 application topically 2 (two) times daily. 30 g 2  . naproxen (NAPROSYN) 250 MG tablet Take 1 tablet by mouth in the morning and at bedtime.    Marland Kitchen nystatin cream (MYCOSTATIN) 1 APPLICATION TWICE A DAY EXTERNALLY    . ONETOUCH VERIO test strip     . pravastatin (PRAVACHOL) 40 MG tablet Take 1 tablet once a day for cholesterol    .  SODIUM CHLORIDE, EXTERNAL, (SALINE WOUND [place]) 0.9 % SOLN Cleanse left foot ulcer with saline once daily. 210 mL 0   No current facility-administered medications on file prior to visit.     Allergies  Allergen Reactions  . Ciprofloxacin Other (See Comments)    Unknown  . Penicillins Other (See Comments)    Unknown  . Sulfa Antibiotics   . Sulfamethoxazole Other (See Comments)    Unknown     Family History  Problem Relation Age of Onset  . Hypertension Mother   . Alzheimer's disease Mother   . Heart attack Father   . CAD Father   . Alzheimer's disease Father   . Bladder Cancer Father        dx late 23s  . Diverticulitis Sister   . Obesity Sister   . Hypertension Sister   . Voice disorder Brother   . Breast cancer Paternal Aunt 76  . Prostate cancer  Paternal Uncle        dx mid 9s     Social History   Occupational History  . Occupation: Retired    Comment: Pharmacist, hospital  Tobacco Use  . Smoking status: Never Smoker  . Smokeless tobacco: Never Used  Substance and Sexual Activity  . Alcohol use: No  . Drug use: No  . Sexual activity: Not on file     Immunization History  Administered Date(s) Administered  . Tdap 05/14/2019    Objective:  Physical Exam: There were no vitals filed for this visit.   [name] is a pleasant 69 y.o. Caucasian female morbidly obese in NAD. AAO x 3.  Vascular Examination: Capillary fill time to digits <3 seconds b/l lower extremities. Palpable DP pulse(s) b/l lower extremities Palpable PT pulse(s) b/l lower extremities Pedal hair absent. Lower extremity skin temperature gradient within normal limits. No pain with calf compression b/l.  Dermatological Examination: Pedal skin with normal turgor, texture and tone bilaterally. No open wounds bilaterally. No interdigital macerations bilaterally. Toenails 1-5 b/l well maintained with adequate length. No erythema, no edema, no drainage, no flocculence. Ulcer submet head 1 left foot completely epitheliallized with no edema, no erythema, no drainage and no fluctuance.   Musculoskeletal Examination: Normal muscle strength 5/5 to all lower extremity muscle groups bilaterally. No pain crepitus or joint limitation noted with ROM b/l. Hallux valgus with bunion deformity noted b/l lower extremities. Hammertoes noted to the L 2nd toe. Lower extremity amputation(s): digital amputation L 3rd toe and R hallux. Utilizes cane for ambulation assistance.  Neurological Examination: Protective sensation diminished with 10g monofilament b/l.  Assessment:  1. Healed diabetic foot ulcer 2. Status post amputation of right great toe ([place]) 3. Status post amputation of lesser toe of left foot ([place]) 4. Diabetic peripheral neuropathy associated with type 2 diabetes mellitus  ([place])  Plan:  -Patient was evaluated and treated and all questions answered.  -Patient/POA/Family member educated on diagnosis and treatment plan of routine ulcer debridement/wound care.  -Ulceration debridement achieved utilizing sharp excisional debridement with sterile scalpel blade.. Type/amount of devitalized tissue removed: nonviable hyperkeratosis -Today's ulcer submet head 1 left foot has completely resolved. -Ulceration cleansed with wound cleanser. triple antibiotic ointment applied to base of ulceration and secured with light dressing. -Wound responded well to today's debridement. -Patient risk factors affecting healing of ulcer: diabetes, diabetic neuropathy, foot deformity, recurrence -Patient to report any pedal injuries to medical professional immediately. -Patient to continue soft, supportive shoe gear daily. -Patient/POA to call should there be question/concern in the  interim.  Return in about 3 weeks (around 02/07/2020).  Marzetta Board, DPM

## 2020-01-23 ENCOUNTER — Encounter: Payer: Self-pay | Admitting: *Deleted

## 2020-01-23 ENCOUNTER — Ambulatory Visit: Payer: PPO | Admitting: Internal Medicine

## 2020-01-23 ENCOUNTER — Encounter: Payer: Self-pay | Admitting: Internal Medicine

## 2020-01-23 ENCOUNTER — Other Ambulatory Visit: Payer: Self-pay

## 2020-01-23 VITALS — BP 126/54 | HR 68 | Ht 69.0 in | Wt 264.0 lb

## 2020-01-23 DIAGNOSIS — E668 Other obesity: Secondary | ICD-10-CM

## 2020-01-23 DIAGNOSIS — E669 Obesity, unspecified: Secondary | ICD-10-CM

## 2020-01-23 DIAGNOSIS — E782 Mixed hyperlipidemia: Secondary | ICD-10-CM

## 2020-01-23 DIAGNOSIS — Z01810 Encounter for preprocedural cardiovascular examination: Secondary | ICD-10-CM

## 2020-01-23 DIAGNOSIS — R002 Palpitations: Secondary | ICD-10-CM | POA: Diagnosis not present

## 2020-01-23 DIAGNOSIS — I10 Essential (primary) hypertension: Secondary | ICD-10-CM | POA: Diagnosis not present

## 2020-01-23 NOTE — Progress Notes (Signed)
OFFICE CONSULT NOTE  Chief Complaint:  Palpitations, history of Takatsubo's cardiomyopathy  Primary Care Physician: [name], MD  HPI:  [name] is a 69 y.o. female who is being seen today for the evaluation of palpitations at the request of [name], MD.  [name] is referred today for evaluation of palpitations and tachycardia.  She reports episodes of racing heartbeat at night, 3 of which she can recall specifically, however they occurred last December and she has not had any more episodes in the past 5 months.  She has remote history of Takatsubo cardiomyopathy.  Apparently this was a diagnosis she received in 2009 when she presented with chest pain.  She says she had a heart catheterization in 2009 and was told she did not have any coronary disease.  She felt that this was at [facility], but I cannot find records.  I can also not find records of her prior echocardiogram.  She said she saw a cardiologist at [facility], but he is no longer practicing there.  She denies any chest pain or worsening shortness of breath.  Recent labs were reviewed from February 2019 which showed total cholesterol 166, HDL 48, LDL 80 and triglycerides 190.  Hemoglobin A1c was 6.4.  04/19/2019  [name] returns today for follow-up.  I last saw her via telemedicine visit.  She was having some worsening edema and I increased her Lasix.  Since then there is been additional weight gain from 268 up to 287 pounds.  She still struggling with edema and some neuropathy.  She has a upcoming appointment with Dr. Delice Lesch in February with neurology.  Recently she has had some labile blood pressures.  In fact her blood pressure has been elevated and heart rate as well.  She has been undergoing injections of her knee with minimal benefit.  She had been in discussions with the clinical pharmacist at our practice about possibly changing her medicines, but no adjustment had been made.  07/19/2019  [name] is seen  today in follow-up.  There has been some improvement in her edema as well as some additional weight loss.  Palpitations have improved with increase in beta-blocker as well and her blood pressure was normal today 121/65.  Other times it seems to be elevated she says but I do feel like there is been interval improvement.  01/23/2020  [name] returns for follow-up of hypertension/edema.  Overall she notes continued improvement in this.  Blood pressure control has been excellent.  Unfortunately she developed an ulcer on her left foot in the area of a bunionectomy.  She has been in a recovery boot for that.  Moreover she recently was diagnosed with a breast cancer.  She will need upcoming surgery for this.  She denies any heart failure symptoms.  She has no active palpitations.  PMHx:  Past Medical History:  Diagnosis Date   CKD (chronic kidney disease) 06/07/2016   Stage 2, GFR 60-89 ml/min   Diabetic polyneuropathy 06/07/2016   Dyslipidemia    Endometrial adenocarcinoma    Esophageal reflux    Fever blister    Hyperlipidemia    Hypertension, essential 06/07/2016   Major depressive disorder    Mixed dyslipidemia 06/07/2016   Obstructive sleep apnea 06/07/2016   Peripheral neuropathy    Retinopathy    Severe obesity    Skin ulcer of left foot with fat layer exposed 11/02/2016   Stroke    Asymptomatic, discovered via neuroimaging; small infarct in left  parietal lobe; also concern for small b/l frontal infarcts   Tachycardia 07/19/2017   Type 2 diabetes mellitus with hyperglycemia, with long-term current use of insulin 06/07/2016   Unilateral primary osteoarthritis, right knee 04/16/2019    Past Surgical History:  Procedure Laterality Date   ADRENALECTOMY     BUNIONECTOMY     CARDIAC CATHETERIZATION     CERVICAL SPINE SURGERY     CHOLECYSTECTOMY     LAPAROSCOPIC SALPINGOOPHERECTOMY     THORACOTOMY     TONSILLECTOMY      FAMHx:  Family History  Problem  Relation Age of Onset   Hypertension Mother    Alzheimer's disease Mother    Heart attack Father    CAD Father    Alzheimer's disease Father    Bladder Cancer Father        dx late 78s   Diverticulitis Sister    Obesity Sister    Hypertension Sister    Voice disorder Brother    Breast cancer Paternal Aunt 32   Prostate cancer Paternal Uncle        dx mid 34s    SOCHx:   reports that she has never smoked. She has never used smokeless tobacco. She reports that she does not drink alcohol and does not use drugs.  ALLERGIES:  Allergies  Allergen Reactions   Ciprofloxacin Other (See Comments)    Unknown   Penicillins Other (See Comments)    Unknown   Sulfa Antibiotics    Sulfamethoxazole Other (See Comments)    Unknown    ROS: Pertinent items noted in HPI and remainder of comprehensive ROS otherwise negative.  HOME MEDS: Current Outpatient Medications on File Prior to Visit  Medication Sig Dispense Refill   aspirin EC 81 MG tablet Take 81 mg by mouth daily. Swallow whole.     buPROPion (WELLBUTRIN XL) 300 MG 24 hr tablet Take 300 mg by mouth daily.     carvedilol (COREG) 6.25 MG tablet Take 1 tablet (6.25 mg total) by mouth 2 (two) times daily. 180 tablet 1   FLUoxetine (PROZAC) 40 MG capsule Take 40 mg by mouth daily.   0   insulin NPH-regular Human (NOVOLIN 70/30) (70-30) 100 UNIT/ML injection Inject into the skin. 24 ml in the morning and 20 ml in the evening     Inulin (FIBER CHOICE PO) Take by mouth.     Lancets (ONETOUCH DELICA PLUS [id number]) MISC USE AS DIRECTED 3 TIMES A DAY     losartan (COZAAR) 100 MG tablet Take 100 mg by mouth daily.      metFORMIN (GLUCOPHAGE-XR) 500 MG 24 hr tablet Take 1,000 mg by mouth in the morning and at bedtime. PT TAKES 2000 MG DAILY     MONOJECT INSULIN SYRINGE 31G X 5/16" 1 ML MISC See admin instructions.     naproxen (NAPROSYN) 250 MG tablet Take 1 tablet by mouth in the morning and at bedtime.      nystatin cream (MYCOSTATIN) 1 APPLICATION TWICE A DAY EXTERNALLY     ONETOUCH VERIO test strip      pravastatin (PRAVACHOL) 40 MG tablet Take 1 tablet once a day for cholesterol     SODIUM CHLORIDE, EXTERNAL, (SALINE WOUND W[place]) 0.9 % SOLN Cleanse left foot ulcer with saline once daily. 210 mL 0   No current facility-administered medications on file prior to visit.    LABS/IMAGING: No results found for this or any previous visit (from the past 48 hour(s)). No results found.  LIPID  PANEL:    Component Value Date/Time   CHOL  07/31/2007 0930    117        ATP III CLASSIFICATION:  <200     mg/dL   Desirable  200-239  mg/dL   Borderline High  >=240    mg/dL   High   TRIG 211 (H) 07/31/2007 0930   HDL 29 (L) 07/31/2007 0930   CHOLHDL 4.0 07/31/2007 0930   VLDL 42 (H) 07/31/2007 0930   LDLCALC  07/31/2007 0930    46        Total Cholesterol/HDL:CHD Risk Coronary Heart Disease Risk Table                     Men   Women  1/2 Average Risk   3.4   3.3    WEIGHTS: Wt Readings from Last 3 Encounters:  01/23/20 264 lb (119.7 kg)  01/08/20 265 lb 6.4 oz (120.4 kg)  11/04/19 258 lb (117 kg)    VITALS: Ht 5\' 9"  (1.753 m)    Wt 264 lb (119.7 kg)    BMI 38.99 kg/m   EXAM: General appearance: alert, no distress and moderately obese Neck: no carotid bruit, no JVD and thyroid not enlarged, symmetric, no tenderness/mass/nodules Lungs: clear to auscultation bilaterally Heart: regular rate and rhythm Abdomen: obese Extremities: edema 1+ Pulses: 2+ and symmetric Skin: Skin color, texture, turgor normal. No rashes or lesions Neurologic: Grossly normal Psych: Pleasant  EKG: Deferred  ASSESSMENT: 1. Palpitations/tachycardia - improved 2. History of Takatsubo cardiomyopathy 3. Possible heart cath-no known coronary disease 4. Hypertension 5. Dyslipidemia 6. Type 2 diabetes on insulin 7. Near morbid obesity  PLAN: 1.   Mrs. Hilleary denies any recurrent palpitations or  tachycardia.  She has no heart failure symptoms or worsening shortness of breath.  Blood pressure is well controlled.  She is at acceptable risk for upcoming surgical procedures for her breast cancer.  Happy to see her back annually or sooner as necessary.  Pixie Casino, MD, [facility], [place] Director of the Advanced Lipid Disorders &  Cardiovascular Risk Reduction Clinic Diplomate of the American Board of Clinical Lipidology Attending Cardiologist  Direct Dial: [phone]   Fax: [phone]  Website:  [email or web address] [email or web address] 01/23/2020, 1:54 PM

## 2020-01-23 NOTE — Patient Instructions (Signed)

## 2020-01-27 DIAGNOSIS — Z23 Encounter for immunization: Secondary | ICD-10-CM | POA: Diagnosis not present

## 2020-01-27 DIAGNOSIS — E1165 Type 2 diabetes mellitus with hyperglycemia: Secondary | ICD-10-CM | POA: Diagnosis not present

## 2020-01-27 DIAGNOSIS — Z794 Long term (current) use of insulin: Secondary | ICD-10-CM | POA: Diagnosis not present

## 2020-01-27 DIAGNOSIS — E1142 Type 2 diabetes mellitus with diabetic polyneuropathy: Secondary | ICD-10-CM | POA: Diagnosis not present

## 2020-01-27 DIAGNOSIS — Z89411 Acquired absence of right great toe: Secondary | ICD-10-CM | POA: Diagnosis not present

## 2020-01-28 DIAGNOSIS — E1142 Type 2 diabetes mellitus with diabetic polyneuropathy: Secondary | ICD-10-CM | POA: Diagnosis not present

## 2020-01-31 DIAGNOSIS — E1165 Type 2 diabetes mellitus with hyperglycemia: Secondary | ICD-10-CM | POA: Diagnosis not present

## 2020-01-31 DIAGNOSIS — F4321 Adjustment disorder with depressed mood: Secondary | ICD-10-CM | POA: Diagnosis not present

## 2020-01-31 DIAGNOSIS — M47812 Spondylosis without myelopathy or radiculopathy, cervical region: Secondary | ICD-10-CM | POA: Diagnosis not present

## 2020-01-31 DIAGNOSIS — E785 Hyperlipidemia, unspecified: Secondary | ICD-10-CM | POA: Diagnosis not present

## 2020-01-31 DIAGNOSIS — I10 Essential (primary) hypertension: Secondary | ICD-10-CM | POA: Diagnosis not present

## 2020-01-31 DIAGNOSIS — N183 Chronic kidney disease, stage 3 unspecified: Secondary | ICD-10-CM | POA: Diagnosis not present

## 2020-01-31 DIAGNOSIS — I2510 Atherosclerotic heart disease of native coronary artery without angina pectoris: Secondary | ICD-10-CM | POA: Diagnosis not present

## 2020-01-31 DIAGNOSIS — F339 Major depressive disorder, recurrent, unspecified: Secondary | ICD-10-CM | POA: Diagnosis not present

## 2020-01-31 DIAGNOSIS — I129 Hypertensive chronic kidney disease with stage 1 through stage 4 chronic kidney disease, or unspecified chronic kidney disease: Secondary | ICD-10-CM | POA: Diagnosis not present

## 2020-02-04 ENCOUNTER — Other Ambulatory Visit: Payer: Self-pay

## 2020-02-04 ENCOUNTER — Ambulatory Visit: Payer: PPO | Admitting: Podiatry

## 2020-02-04 ENCOUNTER — Encounter: Payer: Self-pay | Admitting: Podiatry

## 2020-02-04 DIAGNOSIS — E1142 Type 2 diabetes mellitus with diabetic polyneuropathy: Secondary | ICD-10-CM | POA: Diagnosis not present

## 2020-02-04 DIAGNOSIS — Z8631 Personal history of diabetic foot ulcer: Secondary | ICD-10-CM

## 2020-02-04 DIAGNOSIS — Z89422 Acquired absence of other left toe(s): Secondary | ICD-10-CM

## 2020-02-04 DIAGNOSIS — Z89411 Acquired absence of right great toe: Secondary | ICD-10-CM | POA: Diagnosis not present

## 2020-02-04 NOTE — Progress Notes (Signed)
Nutrition  Patient identified by attending Breast Clinic on 01/08/2020.  Patient was given nutrition packet by nurse navigator which included RD contact information.   Chart reviewed.   69 year old female with newly diagnosed breast cancer.  Planning lumpectomy and radiation.   Ht: 69 inches Wt: 264 lb BMI: 38  Patient currently not at risk of malnutrition.  Please consult RD if changes in nutritional status occur.  Brennyn Ortlieb B. Zenia Resides, [place], [place] Registered Dietitian [phone] (mobile)

## 2020-02-08 NOTE — Progress Notes (Signed)
Subjective:  [name], female    DOB: [birth year],  MRN: [id number]  69 y.o. female is seen today for evaluation of left foot.  Prescribed wound care regimen: Mupirocin Ointment dressing daily.  She's continuing to use the  offloaded Darco shoe.  Patient relates wound has resolved..  She is having no constitutional symptoms, denying fever, chills, anorexia, or weight loss..  She states she will be starting breast cancer treatment soon.   Past Medical History:  Diagnosis Date  . CKD (chronic kidney disease) 06/07/2016   Stage 2, GFR 60-89 ml/min  . Diabetic polyneuropathy 06/07/2016  . Dyslipidemia   . Endometrial adenocarcinoma   . Esophageal reflux   . Fever blister   . Hyperlipidemia   . Hypertension, essential 06/07/2016  . Major depressive disorder   . Mixed dyslipidemia 06/07/2016  . Obstructive sleep apnea 06/07/2016  . Peripheral neuropathy   . Retinopathy   . Severe obesity   . Skin ulcer of left foot with fat layer exposed 11/02/2016  . Stroke    Asymptomatic, discovered via neuroimaging; small infarct in left parietal lobe; also concern for small b/l frontal infarcts  . Tachycardia 07/19/2017  . Type 2 diabetes mellitus with hyperglycemia, with long-term current use of insulin 06/07/2016  . Unilateral primary osteoarthritis, right knee 04/16/2019     Past Surgical History:  Procedure Laterality Date  . ADRENALECTOMY    . BUNIONECTOMY    . CARDIAC CATHETERIZATION    . CERVICAL SPINE SURGERY    . CHOLECYSTECTOMY    . LAPAROSCOPIC SALPINGOOPHERECTOMY    . THORACOTOMY    . TONSILLECTOMY       Current Outpatient Medications on File Prior to Visit  Medication Sig Dispense Refill  . aspirin EC 81 MG tablet Take 81 mg by mouth daily. Swallow whole.    Marland Kitchen buPROPion (WELLBUTRIN XL) 300 MG 24 hr tablet Take 300 mg by mouth daily.    . carvedilol (COREG) 6.25 MG tablet Take 1 tablet (6.25 mg total) by mouth 2 (two) times daily. 180 tablet 1  .  FLUoxetine (PROZAC) 40 MG capsule Take 40 mg by mouth daily.   0  . insulin NPH-regular Human (NOVOLIN 70/30) (70-30) 100 UNIT/ML injection Inject into the skin. 24 ml in the morning and 20 ml in the evening    . Inulin (FIBER CHOICE PO) Take by mouth.    . Lancets (ONETOUCH DELICA PLUS [id number]) MISC USE AS DIRECTED 3 TIMES A DAY    . losartan (COZAAR) 100 MG tablet Take 100 mg by mouth daily.     . metFORMIN (GLUCOPHAGE-XR) 500 MG 24 hr tablet Take 1,000 mg by mouth in the morning and at bedtime. PT TAKES 2000 MG DAILY    . MONOJECT INSULIN SYRINGE 31G X 5/16" 1 ML MISC See admin instructions.    . naproxen (NAPROSYN) 250 MG tablet Take 1 tablet by mouth in the morning and at bedtime.    Marland Kitchen nystatin cream (MYCOSTATIN) 1 APPLICATION TWICE A DAY EXTERNALLY    . ONETOUCH VERIO test strip     . pravastatin (PRAVACHOL) 40 MG tablet Take 1 tablet once a day for cholesterol    . SODIUM CHLORIDE, EXTERNAL, (SALINE WOUND [place]) 0.9 % SOLN Cleanse left foot ulcer with saline once daily. 210 mL 0   No current facility-administered medications on file prior to visit.     Allergies  Allergen Reactions  . Ciprofloxacin Other (See Comments)    Unknown  . Penicillins  Other (See Comments)    Unknown  . Sulfa Antibiotics   . Sulfamethoxazole Other (See Comments)    Unknown     Family History  Problem Relation Age of Onset  . Hypertension Mother   . Alzheimer's disease Mother   . Heart attack Father   . CAD Father   . Alzheimer's disease Father   . Bladder Cancer Father        dx late 78s  . Diverticulitis Sister   . Obesity Sister   . Hypertension Sister   . Voice disorder Brother   . Breast cancer Paternal Aunt 51  . Prostate cancer Paternal Uncle        dx mid 46s     Social History   Occupational History  . Occupation: Retired    Comment: Pharmacist, hospital  Tobacco Use  . Smoking status: Never Smoker  . Smokeless tobacco: Never Used  Substance and Sexual Activity  . Alcohol use: No  .  Drug use: No  . Sexual activity: Not on file     Immunization History  Administered Date(s) Administered  . Tdap 05/14/2019    Objective:  Physical Exam: There were no vitals filed for this visit.   [name] is a pleasant 69 y.o. Caucasian female morbidly obese in NAD. AAO x 3.  Vascular Examination: Capillary fill time to digits <3 seconds b/l lower extremities. Palpable DP pulse(s) b/l lower extremities Palpable PT pulse(s) b/l lower extremities Pedal hair absent. Lower extremity skin temperature gradient within normal limits. No pain with calf compression b/l.  Dermatological Examination: Pedal skin with normal turgor, texture and tone bilaterally. No open wounds bilaterally. No interdigital macerations bilaterally. Toenails 1-5 b/l well maintained with adequate length. No erythema, no edema, no drainage, no flocculence. Ulcer submet head 1 left foot has remained closed with no erythema, no edema, no drainage and no fluctuance.   Musculoskeletal Examination: Normal muscle strength 5/5 to all lower extremity muscle groups bilaterally. No pain crepitus or joint limitation noted with ROM b/l. Hallux valgus with bunion deformity noted b/l lower extremities. Hammertoes noted to the L 2nd toe. Lower extremity amputation(s): digital amputation L 3rd toe and R hallux. Utilizes cane for ambulation assistance.  Neurological Examination: Protective sensation diminished with 10g monofilament b/l.  Assessment:  1. Healed diabetic foot ulcer 2. Status post amputation of right great toe ([place]) 3. Status post amputation of lesser toe of left foot ([place]) 4. Diabetic peripheral neuropathy associated with type 2 diabetes mellitus ([place])  Plan:  -Examined patient. -Continue using offloaded Darco shoe. -She has appt scheduled for her routine foot care. -Patient risk factors affecting healing of ulcer: diabetes, diabetic neuropathy, foot deformity, recurrence -Patient to report any pedal  injuries to medical professional immediately. -Patient to continue soft, supportive shoe gear daily. -Patient/POA to call should there be question/concern in the interim.  Return in about 7 weeks (around 03/23/2020).  Marzetta Board, DPM

## 2020-02-17 ENCOUNTER — Telehealth: Payer: Self-pay | Admitting: *Deleted

## 2020-02-17 NOTE — Telephone Encounter (Signed)
Pt called with c/o a "head cold". Denies fever. Symptoms of sinus drainage and a productive cough. Dr. Ninfa Linden notified as sx is scheduled for 12/7. Pt has not taken a covid test. I scheduled for one on 12/3.

## 2020-02-18 ENCOUNTER — Other Ambulatory Visit: Payer: Self-pay

## 2020-02-18 ENCOUNTER — Encounter (HOSPITAL_BASED_OUTPATIENT_CLINIC_OR_DEPARTMENT_OTHER): Payer: Self-pay | Admitting: Surgery

## 2020-02-18 NOTE — Progress Notes (Signed)
Pt chart reviewed with Dr Sabra Heck, [place] to proceed at [facility] without further cardiac testing.

## 2020-02-21 ENCOUNTER — Other Ambulatory Visit (HOSPITAL_COMMUNITY)
Admission: RE | Admit: 2020-02-21 | Discharge: 2020-02-21 | Disposition: A | Discharge status: Home / Self Care | Payer: PPO | Source: Ambulatory Visit | Attending: Surgery | Admitting: Surgery

## 2020-02-21 ENCOUNTER — Encounter (HOSPITAL_BASED_OUTPATIENT_CLINIC_OR_DEPARTMENT_OTHER)
Admission: RE | Admit: 2020-02-21 | Discharge: 2020-02-21 | Disposition: A | Discharge status: Home / Self Care | Payer: PPO | Source: Ambulatory Visit | Attending: Surgery | Admitting: Surgery

## 2020-02-21 DIAGNOSIS — Z01812 Encounter for preprocedural laboratory examination: Secondary | ICD-10-CM | POA: Diagnosis not present

## 2020-02-21 DIAGNOSIS — E119 Type 2 diabetes mellitus without complications: Secondary | ICD-10-CM | POA: Insufficient documentation

## 2020-02-21 DIAGNOSIS — I10 Essential (primary) hypertension: Secondary | ICD-10-CM | POA: Insufficient documentation

## 2020-02-21 DIAGNOSIS — Z20822 Contact with and (suspected) exposure to covid-19: Secondary | ICD-10-CM | POA: Insufficient documentation

## 2020-02-21 DIAGNOSIS — Z01818 Encounter for other preprocedural examination: Secondary | ICD-10-CM | POA: Insufficient documentation

## 2020-02-21 DIAGNOSIS — R002 Palpitations: Secondary | ICD-10-CM | POA: Insufficient documentation

## 2020-02-21 LAB — BASIC METABOLIC PANEL
Anion gap: 11 (ref 5–15)
BUN: 26 mg/dL — ABNORMAL HIGH (ref 8–23)
CO2: 25 mmol/L (ref 22–32)
Calcium: 9.9 mg/dL (ref 8.9–10.3)
Chloride: 101 mmol/L (ref 98–111)
Creatinine, Ser: 1.09 mg/dL — ABNORMAL HIGH (ref 0.44–1.00)
GFR, Estimated: 55 mL/min — ABNORMAL LOW (ref >60)
Glucose, Bld: 154 mg/dL — ABNORMAL HIGH (ref 70–99)
Potassium: 4.8 mmol/L (ref 3.5–5.1)
Sodium: 137 mmol/L (ref 135–145)

## 2020-02-21 LAB — SARS CORONAVIRUS 2 (TAT 6-24 HRS): SARS Coronavirus 2: NEGATIVE

## 2020-02-21 MED ORDER — CHLORHEXIDINE GLUCONATE CLOTH 2 % EX PADS: 6.0000 | MEDICATED_PAD | Freq: Once | CUTANEOUS | Status: DC

## 2020-02-21 MED ORDER — ENSURE PRE-SURGERY PO LIQD: 296.0000 mL | Freq: Once | ORAL | Status: DC

## 2020-02-21 NOTE — Progress Notes (Signed)

## 2020-02-24 DIAGNOSIS — D0512 Intraductal carcinoma in situ of left breast: Secondary | ICD-10-CM | POA: Diagnosis not present

## 2020-02-24 NOTE — H&P (Signed)
   [name]  Location: [facility] Surgery Patient #: [id number] DOB: March 05, 1951 Undefined / Language: Cleophus Molt / Race: White Female   History of Present Illness  The patient is a 69 year old female who presents with breast cancer.  Chief complaint: Left breast ductal carcinoma in situ  This is a 69 year old female who was found on screening mammography to have 2 suspicious areas in the left breast. These measured 0.5 and 0.9 cm and were close each other with a total area measuring approximately 1.1 cm. These were biopsied showing ductal carcinoma in situ. It was ER and PR negative. She has a family history of having a paternal aunt diagnosed with breast cancer at 68. She has had no previous problems with her breast. She denies nipple discharge. She is otherwise without complaints.   Medication History  Medications Reconciled    Physical Exam  The physical exam findings are as follows: Note: She appears well on exam  Breast exam bilaterally. There are no palpable masses in the breast. There is mild ecchymosis in the left breast from the biopsy. The nipple areolar complex is normal. There is no axillary adenopathy.    Assessment & Plan  DUCTAL CARCINOMA IN SITU (DCIS) OF LEFT BREAST (D05.12)  Impression: I have reviewed the patient's records in the electronic medical records. I have reviewed her mammogram, ultrasound, and pathology results. We also discussed her in our multidisciplinary breast conference today. She has ductal carcinoma in situ of left breast. This is 2 small areas that are adjacent to each other. I discussed the diagnosis with her in detail. We discussed breast conservation versus mastectomy. She wished to proceed with breast conservation. I discussed the procedure which would be a radioactive seed guided left breast lumpectomy with 2 separate clips placed for bracketing. I discussed the surgical procedure in detail. I discussed the risks which  includes but is not limited to bleeding, infection, the need for further surgery if margins are positive, cardiopulmonary issues, etc. She understands and wished to proceed with surgery which will be scheduled.

## 2020-02-25 ENCOUNTER — Encounter (HOSPITAL_BASED_OUTPATIENT_CLINIC_OR_DEPARTMENT_OTHER): Payer: Self-pay | Admitting: Surgery

## 2020-02-25 ENCOUNTER — Ambulatory Visit (HOSPITAL_BASED_OUTPATIENT_CLINIC_OR_DEPARTMENT_OTHER): Payer: PPO | Admitting: Certified Registered"

## 2020-02-25 ENCOUNTER — Ambulatory Visit (HOSPITAL_BASED_OUTPATIENT_CLINIC_OR_DEPARTMENT_OTHER)
Admission: RE | Admit: 2020-02-25 | Discharge: 2020-02-25 | Disposition: A | Discharge status: Home / Self Care | Payer: PPO | Source: Ambulatory Visit | Attending: Surgery | Admitting: Surgery

## 2020-02-25 ENCOUNTER — Encounter (HOSPITAL_BASED_OUTPATIENT_CLINIC_OR_DEPARTMENT_OTHER)
Admission: RE | Disposition: A | Discharge status: Home / Self Care | Payer: Self-pay | Source: Ambulatory Visit | Attending: Surgery

## 2020-02-25 ENCOUNTER — Other Ambulatory Visit: Payer: Self-pay

## 2020-02-25 DIAGNOSIS — Z803 Family history of malignant neoplasm of breast: Secondary | ICD-10-CM | POA: Diagnosis not present

## 2020-02-25 DIAGNOSIS — D0512 Intraductal carcinoma in situ of left breast: Secondary | ICD-10-CM | POA: Insufficient documentation

## 2020-02-25 DIAGNOSIS — F329 Major depressive disorder, single episode, unspecified: Secondary | ICD-10-CM | POA: Diagnosis not present

## 2020-02-25 DIAGNOSIS — E785 Hyperlipidemia, unspecified: Secondary | ICD-10-CM | POA: Diagnosis not present

## 2020-02-25 DIAGNOSIS — I639 Cerebral infarction, unspecified: Secondary | ICD-10-CM | POA: Diagnosis not present

## 2020-02-25 HISTORY — PX: BREAST LUMPECTOMY WITH RADIOACTIVE SEED LOCALIZATION: SHX6424

## 2020-02-25 LAB — GLUCOSE, CAPILLARY
Glucose-Capillary: 193 mg/dL — ABNORMAL HIGH (ref 70–99)
Glucose-Capillary: 187 mg/dL — ABNORMAL HIGH (ref 70–99)

## 2020-02-25 SURGERY — BREAST LUMPECTOMY WITH RADIOACTIVE SEED LOCALIZATION
Anesthesia: General | Site: Breast | Laterality: Left

## 2020-02-25 MED ORDER — TRAMADOL HCL 50 MG PO TABS
50.0000 mg | ORAL_TABLET | Freq: Four times a day (QID) | ORAL | 0 refills | Status: DC | PRN
Start: 2020-02-25 — End: 2021-06-17

## 2020-02-25 MED ORDER — ACETAMINOPHEN 500 MG PO TABS
ORAL_TABLET | ORAL | Status: AC
  Filled 2020-02-25: qty 2

## 2020-02-25 MED ORDER — PROPOFOL 10 MG/ML IV BOLUS
INTRAVENOUS | Status: AC
  Filled 2020-02-25: qty 20

## 2020-02-25 MED ORDER — AMISULPRIDE (ANTIEMETIC) 5 MG/2ML IV SOLN: 10.0000 mg | Freq: Once | INTRAVENOUS | Status: DC | PRN

## 2020-02-25 MED ORDER — LACTATED RINGERS IV SOLN: INTRAVENOUS | Status: DC

## 2020-02-25 MED ORDER — CEFAZOLIN SODIUM-DEXTROSE 2-4 GM/100ML-% IV SOLN
INTRAVENOUS | Status: AC
  Filled 2020-02-25: qty 100

## 2020-02-25 MED ORDER — GABAPENTIN 300 MG PO CAPS
ORAL_CAPSULE | ORAL | Status: AC
  Filled 2020-02-25: qty 1

## 2020-02-25 MED ORDER — LIDOCAINE HCL (CARDIAC) PF 100 MG/5ML IV SOSY
PREFILLED_SYRINGE | INTRAVENOUS | Status: DC | PRN
  Administered 2020-02-25: 100 mg via INTRAVENOUS

## 2020-02-25 MED ORDER — FENTANYL CITRATE (PF) 100 MCG/2ML IJ SOLN
INTRAMUSCULAR | Status: DC | PRN
  Administered 2020-02-25 (×2): 50 ug via INTRAVENOUS

## 2020-02-25 MED ORDER — GABAPENTIN 300 MG PO CAPS
300.0000 mg | ORAL_CAPSULE | ORAL | Status: AC
  Administered 2020-02-25: 300 mg via ORAL

## 2020-02-25 MED ORDER — OXYCODONE HCL 5 MG PO TABS: 5.0000 mg | ORAL_TABLET | Freq: Once | ORAL | Status: DC | PRN

## 2020-02-25 MED ORDER — ACETAMINOPHEN 500 MG PO TABS
1000.0000 mg | ORAL_TABLET | ORAL | Status: AC
  Administered 2020-02-25: 1000 mg via ORAL

## 2020-02-25 MED ORDER — ONDANSETRON HCL 4 MG/2ML IJ SOLN: 4.0000 mg | Freq: Once | INTRAMUSCULAR | Status: DC | PRN

## 2020-02-25 MED ORDER — DEXAMETHASONE SODIUM PHOSPHATE 10 MG/ML IJ SOLN
INTRAMUSCULAR | Status: AC
  Filled 2020-02-25: qty 1

## 2020-02-25 MED ORDER — CEFAZOLIN SODIUM-DEXTROSE 2-4 GM/100ML-% IV SOLN: 2.0000 g | INTRAVENOUS | Status: DC

## 2020-02-25 MED ORDER — ONDANSETRON HCL 4 MG/2ML IJ SOLN
INTRAMUSCULAR | Status: AC
  Filled 2020-02-25: qty 2

## 2020-02-25 MED ORDER — ONDANSETRON HCL 4 MG/2ML IJ SOLN
INTRAMUSCULAR | Status: DC | PRN
  Administered 2020-02-25: 4 mg via INTRAVENOUS

## 2020-02-25 MED ORDER — FENTANYL CITRATE (PF) 100 MCG/2ML IJ SOLN: 25.0000 ug | INTRAMUSCULAR | Status: DC | PRN

## 2020-02-25 MED ORDER — PROPOFOL 10 MG/ML IV BOLUS
INTRAVENOUS | Status: DC | PRN
  Administered 2020-02-25: 100 mg via INTRAVENOUS

## 2020-02-25 MED ORDER — FENTANYL CITRATE (PF) 100 MCG/2ML IJ SOLN
INTRAMUSCULAR | Status: AC
  Filled 2020-02-25: qty 2

## 2020-02-25 MED ORDER — OXYCODONE HCL 5 MG/5ML PO SOLN: 5.0000 mg | Freq: Once | ORAL | Status: DC | PRN

## 2020-02-25 MED ORDER — LACTATED RINGERS IV SOLN: INTRAVENOUS | Status: DC | PRN

## 2020-02-25 MED ORDER — MIDAZOLAM HCL 2 MG/2ML IJ SOLN
INTRAMUSCULAR | Status: AC
  Filled 2020-02-25: qty 2

## 2020-02-25 MED ORDER — BUPIVACAINE-EPINEPHRINE 0.5% -1:200000 IJ SOLN
INTRAMUSCULAR | Status: DC | PRN
  Administered 2020-02-25: 18 mL

## 2020-02-25 MED ORDER — LIDOCAINE 2% (20 MG/ML) 5 ML SYRINGE
INTRAMUSCULAR | Status: AC
  Filled 2020-02-25: qty 5

## 2020-02-25 MED ORDER — CEFAZOLIN SODIUM-DEXTROSE 2-3 GM-%(50ML) IV SOLR
INTRAVENOUS | Status: DC | PRN
  Administered 2020-02-25: 2 g via INTRAVENOUS

## 2020-02-25 MED ORDER — EPHEDRINE SULFATE 50 MG/ML IJ SOLN
INTRAMUSCULAR | Status: DC | PRN
  Administered 2020-02-25: 15 mg via INTRAVENOUS
  Administered 2020-02-25: 5 mg via INTRAVENOUS

## 2020-02-25 SURGICAL SUPPLY — 48 items
ADH SKN CLS APL DERMABOND .7 (GAUZE/BANDAGES/DRESSINGS) ×1
APL PRP STRL LF DISP 70% ISPRP (MISCELLANEOUS) ×1
APPLIER CLIP 9.375 MED OPEN (MISCELLANEOUS) ×2
APR CLP MED 9.3 20 MLT OPN (MISCELLANEOUS) ×1
BINDER BREAST 3XL (GAUZE/BANDAGES/DRESSINGS) IMPLANT
BINDER BREAST LRG (GAUZE/BANDAGES/DRESSINGS) IMPLANT
BINDER BREAST MEDIUM (GAUZE/BANDAGES/DRESSINGS) IMPLANT
BINDER BREAST XLRG (GAUZE/BANDAGES/DRESSINGS) IMPLANT
BINDER BREAST XXLRG (GAUZE/BANDAGES/DRESSINGS) IMPLANT
BLADE SURG 15 STRL LF DISP TIS (BLADE) ×1 IMPLANT
BLADE SURG 15 STRL SS (BLADE) ×2
CANISTER SUC SOCK COL 7IN (MISCELLANEOUS) IMPLANT
CANISTER SUCT 1200ML W/VALVE (MISCELLANEOUS) IMPLANT
CHLORAPREP W/TINT 26 (MISCELLANEOUS) ×2 IMPLANT
CLIP APPLIE 9.375 MED OPEN (MISCELLANEOUS) IMPLANT
COVER BACK TABLE 60X90IN (DRAPES) ×2 IMPLANT
COVER MAYO STAND STRL (DRAPES) ×2 IMPLANT
COVER PROBE W GEL 5X96 (DRAPES) ×2 IMPLANT
COVER WAND RF STERILE (DRAPES) IMPLANT
DECANTER SPIKE VIAL GLASS SM (MISCELLANEOUS) ×1 IMPLANT
DERMABOND ADVANCED (GAUZE/BANDAGES/DRESSINGS) ×1
DERMABOND ADVANCED .7 DNX12 (GAUZE/BANDAGES/DRESSINGS) ×1 IMPLANT
DRAPE LAPAROSCOPIC ABDOMINAL (DRAPES) ×2 IMPLANT
DRAPE UTILITY XL STRL (DRAPES) ×2 IMPLANT
ELECT REM PT RETURN 9FT ADLT (ELECTROSURGICAL) ×2
ELECTRODE REM PT RTRN 9FT ADLT (ELECTROSURGICAL) ×1 IMPLANT
GAUZE SPONGE 4X4 12PLY STRL LF (GAUZE/BANDAGES/DRESSINGS) IMPLANT
GLOVE SURG LTX SZ7.5 (GLOVE) ×2 IMPLANT
GOWN STRL REUS W/ TWL LRG LVL3 (GOWN DISPOSABLE) ×1 IMPLANT
GOWN STRL REUS W/ TWL XL LVL3 (GOWN DISPOSABLE) ×1 IMPLANT
GOWN STRL REUS W/TWL LRG LVL3 (GOWN DISPOSABLE) ×2
GOWN STRL REUS W/TWL XL LVL3 (GOWN DISPOSABLE) ×2
KIT MARKER MARGIN INK (KITS) ×2 IMPLANT
NEEDLE HYPO 25X1 1.5 SAFETY (NEEDLE) ×2 IMPLANT
NS IRRIG 1000ML POUR BTL (IV SOLUTION) IMPLANT
PACK BASIN DAY SURGERY FS (CUSTOM PROCEDURE TRAY) ×2 IMPLANT
PENCIL SMOKE EVACUATOR (MISCELLANEOUS) ×2 IMPLANT
SLEEVE SCD COMPRESS KNEE MED (MISCELLANEOUS) ×2 IMPLANT
SPONGE LAP 4X18 RFD (DISPOSABLE) ×2 IMPLANT
SUT MNCRL AB 4-0 PS2 18 (SUTURE) ×2 IMPLANT
SUT SILK 2 0 SH (SUTURE) IMPLANT
SUT VIC AB 3-0 SH 27 (SUTURE) ×2
SUT VIC AB 3-0 SH 27X BRD (SUTURE) ×1 IMPLANT
SYR CONTROL 10ML LL (SYRINGE) ×2 IMPLANT
TOWEL GREEN STERILE FF (TOWEL DISPOSABLE) ×2 IMPLANT
TRAY FAXITRON CT DISP (TRAY / TRAY PROCEDURE) ×2 IMPLANT
TUBE CONNECTING 20X1/4 (TUBING) IMPLANT
YANKAUER SUCT BULB TIP NO VENT (SUCTIONS) IMPLANT

## 2020-02-25 NOTE — Anesthesia Procedure Notes (Signed)
Procedure Name: LMA Insertion Performed by: Yuval Nolet M, CRNA Pre-anesthesia Checklist: Patient identified, Emergency Drugs available, Suction available and Patient being monitored Patient Re-evaluated:Patient Re-evaluated prior to induction Oxygen Delivery Method: Circle system utilized Preoxygenation: Pre-oxygenation with 100% oxygen Induction Type: IV induction Ventilation: Mask ventilation without difficulty LMA: LMA inserted LMA Size: 4.0 Number of attempts: 1 Airway Equipment and Method: Bite block Placement Confirmation: positive ETCO2 Tube secured with: Tape Dental Injury: Teeth and Oropharynx as per pre-operative assessment        

## 2020-02-25 NOTE — Op Note (Signed)
LEFT BREAST LUMPECTOMY WITH RADIOACTIVE SEED LOCALIZATION  Procedure Note  SHERISSE FULLILOVE 02/25/2020   Pre-op Diagnosis: LEFT BREAST DUCTAL CARCINOMA IN SITU     Post-op Diagnosis: same  Procedure(s): LEFT BREAST LUMPECTOMY WITH RADIOACTIVE SEED LOCALIZATION  Surgeon(s): Coralie Keens, MD  Anesthesia: General  Staff:  Circulator: McDonough-Hughes, Delene Ruffini, RN Scrub Person: Lorenza Burton, CST  Estimated Blood Loss: Minimal               Specimens: sent to path  Indications: This is a 69 year old female who was recently found on screening mammography to have microcalcifications and a mass in the left breast.  She underwent a biopsy showing 2 areas of ductal carcinoma in situ adjacent to each other.  The decision was made to proceed with a radioactive seed guided lumpectomy.  Findings: The first radioactive seed deployed inadvertently away from the area of DCIS and was removed separately.  The second radioactive seed was in the lumpectomy specimen.  X-ray confirmed removal of both seeds.  Procedure: The patient was brought to the operating room identifies correct patient.  She is placed upon the operating table general anesthesia was induced.  Her left breast was prepped and draped in the usual sterile fashion.  I located the seed at the 9 o'clock position of the left breast as well as more superior underneath the edge of the areola.  I anesthetized skin with Marcaine and then made a circumareolar incision with a scalpel.  The more superior radioactive seed was the one that was displaced.  With the neoprobe I was able to easily dissect down to the seed which I removed and placed in a specimen cup.  An x-ray was performed confirming that this was the radioactive seed.  I then next dissected more inferior laterally with the aid of neoprobe toward the second radioactive seed at the area of the DCIS.  I then took this dissection down to the chest wall and performed a lumpectomy staying  widely around the radioactive seed.  Once the specimen was removed I then marked the margins with pain.  An x-ray was performed confirming that the calcifications and seed were in the specimen.  The specimen was then sent to pathology for evaluation.  I achieved hemostasis with the cautery.  I then anesthetized the incision further with Marcaine.  I placed surgical clips around the periphery of the biopsy cavity.  I then closed the subcutaneous tissue with interrupted 3-0 Vicryl sutures and closed the skin with a running 4-0 Monocryl.  Dermabond was then applied.  The patient tolerated the procedure well.  All the counts were correct at the end of the procedure.  The patient was then extubated in the operating room and taken in a stable condition to the recovery room.          Coralie Keens   Date: 02/25/2020  Time: 8:55 AM

## 2020-02-25 NOTE — Anesthesia Preprocedure Evaluation (Signed)
Anesthesia Evaluation  Patient identified by MRN, date of birth, ID band Patient awake    Reviewed: Allergy & Precautions, NPO status , Patient's Chart, lab work & pertinent test results, reviewed documented beta blocker date and time   History of Anesthesia Complications Negative for: history of anesthetic complications  Airway Mallampati: III  TM Distance: >3 FB Neck ROM: Full    Dental  (+) Teeth Intact   Pulmonary sleep apnea (noncompliant w/ CPAP) ,    Pulmonary exam normal        Cardiovascular hypertension, Pt. on medications and Pt. on home beta blockers +CHF (Takotsubo's cardiomyopathy, 2009)  Normal cardiovascular exam     Neuro/Psych Depression CVA    GI/Hepatic negative GI ROS, Neg liver ROS,   Endo/Other  diabetes, Type 2, Insulin Dependent, Oral Hypoglycemic Agents  Renal/GU Renal InsufficiencyRenal disease (CKD2; Cr 1.09)  negative genitourinary   Musculoskeletal  (+) Arthritis ,   Abdominal   Peds  Hematology negative hematology ROS (+)   Anesthesia Other Findings  Cardiology note by Dr. Debara Pickett 01/23/20: "Mrs. Brunetti denies any recurrent palpitations or tachycardia.  She has no heart failure symptoms or worsening shortness of breath.  Blood pressure is well controlled.  She is at acceptable risk for upcoming surgical procedures for her breast cancer.  Happy to see her back annually or sooner as necessary."  Reproductive/Obstetrics                             Anesthesia Physical Anesthesia Plan  ASA: III  Anesthesia Plan: General   Post-op Pain Management:    Induction: Intravenous  PONV Risk Score and Plan: 3 and Ondansetron, Dexamethasone, Midazolam and Treatment may vary due to age or medical condition  Airway Management Planned: LMA  Additional Equipment: None  Intra-op Plan:   Post-operative Plan: Extubation in OR  Informed Consent: I have reviewed the  patients History and Physical, chart, labs and discussed the procedure including the risks, benefits and alternatives for the proposed anesthesia with the patient or authorized representative who has indicated his/her understanding and acceptance.     Dental advisory given  Plan Discussed with:   Anesthesia Plan Comments:         Anesthesia Quick Evaluation

## 2020-02-25 NOTE — Interval H&P Note (Signed)
History and Physical Interval Note: no change in H and p  02/25/2020 7:55 AM  [name]  has presented today for surgery, with the diagnosis of LEFT BREAST DUCTAL CARCINOMA IN SITU.  The various methods of treatment have been discussed with the patient and family. After consideration of risks, benefits and other options for treatment, the patient has consented to  Procedure(s): LEFT BREAST LUMPECTOMY WITH RADIOACTIVE SEED LOCALIZATION (Left) as a surgical intervention.  The patient's history has been reviewed, patient examined, no change in status, stable for surgery.  I have reviewed the patient's chart and labs.  Questions were answered to the patient's satisfaction.     Coralie Keens

## 2020-02-25 NOTE — Transfer of Care (Signed)
Immediate Anesthesia Transfer of Care Note  Patient: [name]  Procedure(s) Performed: LEFT BREAST LUMPECTOMY WITH RADIOACTIVE SEED LOCALIZATION (Left Breast)  Patient Location: PACU  Anesthesia Type:General  Level of Consciousness: awake, alert  and oriented  Airway & Oxygen Therapy: Patient Spontanous Breathing and Patient connected to face mask oxygen  Post-op Assessment: Report given to RN and Post -op Vital signs reviewed and stable  Post vital signs: Reviewed and stable  Last Vitals:  Vitals Value Taken Time  BP    Temp    Pulse 69 02/25/20 0859  Resp    SpO2 100 % 02/25/20 0859  Vitals shown include unvalidated device data.  Last Pain:  Vitals:   02/25/20 0740  TempSrc: Oral  PainSc: 0-No pain      Patients Stated Pain Goal: 4 (35/36/14 4315)  Complications: No complications documented.

## 2020-02-25 NOTE — Anesthesia Postprocedure Evaluation (Signed)
Anesthesia Post Note  Patient: [name]  Procedure(s) Performed: LEFT BREAST LUMPECTOMY WITH RADIOACTIVE SEED LOCALIZATION (Left Breast)     Patient location during evaluation: PACU Anesthesia Type: General Level of consciousness: awake and alert Pain management: pain level controlled Vital Signs Assessment: post-procedure vital signs reviewed and stable Respiratory status: spontaneous breathing, nonlabored ventilation and respiratory function stable Cardiovascular status: blood pressure returned to baseline and stable Postop Assessment: no apparent nausea or vomiting Anesthetic complications: no   No complications documented.  Last Vitals:  Vitals:   02/25/20 0945 02/25/20 1018  BP: (!) 143/63 (!) 153/81  Pulse: 68 71  Resp: 14 16  Temp:  36.6 C  SpO2: 97% 100%    Last Pain:  Vitals:   02/25/20 1018  TempSrc:   PainSc: 2                  Lidia Collum

## 2020-02-25 NOTE — Discharge Instructions (Signed)
Next dose of Tylenol can be given at 1:45pm if needed.   Post Anesthesia Home Care Instructions  Activity: Get plenty of rest for the remainder of the day. A responsible individual must stay with you for 24 hours following the procedure.  For the next 24 hours, DO NOT: -Drive a car -Paediatric nurse -Drink alcoholic beverages -Take any medication unless instructed by your physician -Make any legal decisions or sign important papers.  Meals: Start with liquid foods such as gelatin or soup. Progress to regular foods as tolerated. Avoid greasy, spicy, heavy foods. If nausea and/or vomiting occur, drink only clear liquids until the nausea and/or vomiting subsides. Call your physician if vomiting continues.  Special Instructions/Symptoms: Your throat may feel dry or sore from the anesthesia or the breathing tube placed in your throat during surgery. If this causes discomfort, gargle with warm salt water. The discomfort should disappear within 24 hours.       [facility] Office Phone Number [phone]  BREAST BIOPSY/ PARTIAL MASTECTOMY: POST OP INSTRUCTIONS  Always review your discharge instruction sheet given to you by the facility where your surgery was performed.  IF YOU HAVE DISABILITY OR FAMILY LEAVE FORMS, YOU MUST BRING THEM TO THE OFFICE FOR PROCESSING.  DO NOT GIVE THEM TO YOUR DOCTOR.  1. A prescription for pain medication may be given to you upon discharge.  Take your pain medication as prescribed, if needed.  If narcotic pain medicine is not needed, then you may take acetaminophen (Tylenol) or ibuprofen (Advil) as needed. 2. Take your usually prescribed medications unless otherwise directed 3. If you need a refill on your pain medication, please contact your pharmacy.  They will contact our office to request authorization.  Prescriptions will not be filled after 5pm or on week-ends. 4. You should eat very light the first 24 hours after surgery, such as  soup, crackers, pudding, etc.  Resume your normal diet the day after surgery. 5. Most patients will experience some swelling and bruising in the breast.  Ice packs and a good support bra will help.  Swelling and bruising can take several days to resolve.  6. It is common to experience some constipation if taking pain medication after surgery.  Increasing fluid intake and taking a stool softener will usually help or prevent this problem from occurring.  A mild laxative (Milk of Magnesia or Miralax) should be taken according to package directions if there are no bowel movements after 48 hours. 7. Unless discharge instructions indicate otherwise, you may remove your bandages 24-48 hours after surgery, and you may shower at that time.  You may have steri-strips (small skin tapes) in place directly over the incision.  These strips should be left on the skin for 7-10 days.  If your surgeon used skin glue on the incision, you may shower in 24 hours.  The glue will flake off over the next 2-3 weeks.  Any sutures or staples will be removed at the office during your follow-up visit. 8. ACTIVITIES:  You may resume regular daily activities (gradually increasing) beginning the next day.  Wearing a good support bra or sports bra minimizes pain and swelling.  You may have sexual intercourse when it is comfortable. a. You may drive when you no longer are taking prescription pain medication, you can comfortably wear a seatbelt, and you can safely maneuver your car and apply brakes. b. RETURN TO WORK:  ______________________________________________________________________________________ 9. You should see your doctor in the office for a follow-up  appointment approximately two weeks after your surgery.  Your doctor's nurse will typically make your follow-up appointment when she calls you with your pathology report.  Expect your pathology report 2-3 business days after your surgery.  You may call to check if you do not hear from  [place] after three days. 10. OTHER INSTRUCTIONS: YOU MAY SHOWER STARTING TOMORROW 11. ICE PACK, TYLENOL, AND IBUPROFEN ALSO FOR PAIN 12. NO VIGOROUS ACTIVITY FOR ONE WEEK _______________________________________________________________________________________________ _____________________________________________________________________________________________________________________________________ _____________________________________________________________________________________________________________________________________ _____________________________________________________________________________________________________________________________________  WHEN TO CALL YOUR DOCTOR: 1. Fever over 101.0 2. Nausea and/or vomiting. 3. Extreme swelling or bruising. 4. Continued bleeding from incision. 5. Increased pain, redness, or drainage from the incision.  The clinic staff is available to answer your questions during regular business hours.  Please don't hesitate to call and ask to speak to one of the nurses for clinical concerns.  If you have a medical emergency, go to the nearest emergency room or call 911.  A surgeon from [facility] is always on call at the hospital.  For further questions, please visit [email or web address]

## 2020-02-26 ENCOUNTER — Encounter (HOSPITAL_BASED_OUTPATIENT_CLINIC_OR_DEPARTMENT_OTHER): Payer: Self-pay | Admitting: Surgery

## 2020-02-27 LAB — SURGICAL PATHOLOGY

## 2020-03-02 ENCOUNTER — Encounter: Payer: Self-pay | Admitting: *Deleted

## 2020-03-03 ENCOUNTER — Inpatient Hospital Stay: Payer: PPO | Attending: Hematology and Oncology | Admitting: Hematology and Oncology

## 2020-03-03 NOTE — Assessment & Plan Note (Deleted)
02/25/2020:Left lumpectomy Ninfa Linden): high grade DCIS, 1.4cm, clear margins, ER/PR negative  Pathology counseling: I discussed the final pathology report of the patient provided  a copy of this report. I discussed the margins . We also discussed the final staging along with previously performed ER/PR testing.  Treatment plan: Adjuvant radiation therapy followed by surveillance  Patient be followed by [place] on an as-needed basis.

## 2020-03-19 DIAGNOSIS — I10 Essential (primary) hypertension: Secondary | ICD-10-CM | POA: Diagnosis not present

## 2020-03-19 DIAGNOSIS — E1165 Type 2 diabetes mellitus with hyperglycemia: Secondary | ICD-10-CM | POA: Diagnosis not present

## 2020-03-19 DIAGNOSIS — I129 Hypertensive chronic kidney disease with stage 1 through stage 4 chronic kidney disease, or unspecified chronic kidney disease: Secondary | ICD-10-CM | POA: Diagnosis not present

## 2020-03-19 DIAGNOSIS — E1140 Type 2 diabetes mellitus with diabetic neuropathy, unspecified: Secondary | ICD-10-CM | POA: Diagnosis not present

## 2020-03-19 DIAGNOSIS — E11319 Type 2 diabetes mellitus with unspecified diabetic retinopathy without macular edema: Secondary | ICD-10-CM | POA: Diagnosis not present

## 2020-03-19 DIAGNOSIS — E785 Hyperlipidemia, unspecified: Secondary | ICD-10-CM | POA: Diagnosis not present

## 2020-03-19 DIAGNOSIS — N183 Chronic kidney disease, stage 3 unspecified: Secondary | ICD-10-CM | POA: Diagnosis not present

## 2020-03-19 DIAGNOSIS — F3342 Major depressive disorder, recurrent, in full remission: Secondary | ICD-10-CM | POA: Diagnosis not present

## 2020-03-19 DIAGNOSIS — I2510 Atherosclerotic heart disease of native coronary artery without angina pectoris: Secondary | ICD-10-CM | POA: Diagnosis not present

## 2020-03-19 DIAGNOSIS — E1121 Type 2 diabetes mellitus with diabetic nephropathy: Secondary | ICD-10-CM | POA: Diagnosis not present

## 2020-03-19 DIAGNOSIS — F339 Major depressive disorder, recurrent, unspecified: Secondary | ICD-10-CM | POA: Diagnosis not present

## 2020-03-23 ENCOUNTER — Ambulatory Visit: Payer: PPO | Admitting: Podiatry

## 2020-03-23 NOTE — Progress Notes (Signed)
Location of Breast Cancer: Ductal carcinoma in situ (DCIS) of LEFT breast  Histology per Pathology Report:  02/25/2020 FINAL MICROSCOPIC DIAGNOSIS:  A. BREAST, LEFT W/SEED, LUMPECTOMY:  - High-grade ductal carcinoma in situ with apocrine features, 1.4 cm.  - Margins not involved.  - DCIS focally 0.3 cm from lateral margin and 0.5 cm from medial margin.  - Biopsy site.  - See oncology table.  B. RADIOACTIVE SEED, FROM LEFT BREAST:  - [place] examination only: Radioactive seed.   Receptor Status: ER(0%), PR (0%)  Did patient present with symptoms (if so, please note symptoms) or was this found on screening mammography?:  Screening mammogram on 11/08/19 showed left breast calcifications. Mammogram on 11/19/19 showed two groups of calcifications in the inferior medial quadrant of the left breast, 0.5cm and 0.9cm.  Past/Anticipated interventions by surgeon, if any: 02/25/2020 Dr. Abigail Miyamoto LEFT BREAST LUMPECTOMY WITH RADIOACTIVE SEED LOCALIZATION  Past/Anticipated interventions by medical oncology, if any:  Under care of Dr. Serena Croissant --Recommendation: 1. Breast conserving surgery 2. Followed by adjuvant radiation therapy --No role of antiestrogen therapy since she is ER/PR negative.  Return to clinic after surgery to discuss the final pathology report   Lymphedema issues, if any:  Patient denies    Pain issues, if any:  Reports occasional discomfort to the left breast since her lumpectomy    SAFETY ISSUES:  Prior radiation? No  Pacemaker/ICD? No  Possible current pregnancy? No--postmenopausal  Is the patient on methotrexate? No  Current Complaints / other details:   Neuropathy in both feet that makes ambulating difficult at times

## 2020-03-24 ENCOUNTER — Ambulatory Visit
Admission: RE | Admit: 2020-03-24 | Discharge: 2020-03-24 | Disposition: A | Discharge status: Home / Self Care | Payer: PPO | Source: Ambulatory Visit | Attending: Radiation Oncology | Admitting: Radiation Oncology

## 2020-03-24 ENCOUNTER — Encounter: Payer: Self-pay | Admitting: Radiation Oncology

## 2020-03-24 ENCOUNTER — Other Ambulatory Visit: Payer: Self-pay

## 2020-03-24 VITALS — BP 151/64 | HR 70 | Temp 97.1°F | Resp 20 | Ht 69.0 in | Wt 263.5 lb

## 2020-03-24 DIAGNOSIS — Z510 Encounter for antineoplastic radiation therapy: Secondary | ICD-10-CM | POA: Insufficient documentation

## 2020-03-24 DIAGNOSIS — Z7982 Long term (current) use of aspirin: Secondary | ICD-10-CM | POA: Diagnosis not present

## 2020-03-24 DIAGNOSIS — Z923 Personal history of irradiation: Secondary | ICD-10-CM | POA: Diagnosis not present

## 2020-03-24 DIAGNOSIS — Z171 Estrogen receptor negative status [ER-]: Secondary | ICD-10-CM | POA: Diagnosis not present

## 2020-03-24 DIAGNOSIS — D0512 Intraductal carcinoma in situ of left breast: Secondary | ICD-10-CM | POA: Insufficient documentation

## 2020-03-24 DIAGNOSIS — Z79899 Other long term (current) drug therapy: Secondary | ICD-10-CM | POA: Insufficient documentation

## 2020-03-24 NOTE — Progress Notes (Signed)
Radiation Oncology         [phone] ________________________________  Name: [name] MRN: [id number]  Date: 03/24/2020  DOB: [birth year]  Follow-Up Visit Note  Outpatient  CC: [name], MD  Nicholas Lose, MD  Diagnosis:      ICD-10-CM   1. Ductal carcinoma in situ (DCIS) of left breast  D05.12    Cancer Staging Ductal carcinoma in situ (DCIS) of left breast Staging form: Breast, AJCC 8th Edition - Clinical stage from 01/08/2020: Stage 0 (cTis (DCIS), cN0, cM0, ER-, PR-, HER2: Not Assessed) - Signed by Nicholas Lose, MD on 01/08/2020   CHIEF COMPLAINT: Here to discuss management of left breast DCIS  Narrative:  The patient returns today for follow-up. She was seen in the multidisciplinary breast clinic on 01/08/2020, during which time it was recommended that she proceed with surgery followed by radiation therapy.   She has not undergone any significant imaging studies since consultation.  Breast surgery on the date of 02/25/2020 revealed: tumor size of 1.4 cm; histology of ductal carcinoma in situ with apocrine features; margin status to in situ disease of focally 0.3 cm from the lateral margin and 0.5 cm from the medial margin; ER status: 0% negative; PR status: 0% negative; Grade: 3.   Reports occasional discomfort to the left breast since her lumpectomy    SAFETY ISSUES:  Prior radiation? No  Pacemaker/ICD? No  Possible current pregnancy? No--postmenopausal  Is the patient on methotrexate? No  Current Complaints / other details:   Neuropathy in both feet that makes ambulating difficult at times        ALLERGIES:  is allergic to amitriptyline, ciprofloxacin, penicillins, sulfa antibiotics, and sulfamethoxazole.  Meds: Current Outpatient Medications  Medication Sig Dispense Refill  . aspirin EC 81 MG tablet Take 81 mg by mouth daily. Swallow whole.    Marland Kitchen buPROPion (WELLBUTRIN XL) 300 MG 24 hr tablet Take 300 mg by mouth daily.    . carvedilol (COREG)  12.5 MG tablet Take 12.5 mg by mouth 2 (two) times daily with a meal.    . FLUoxetine (PROZAC) 40 MG capsule Take 40 mg by mouth daily.   0  . furosemide (LASIX) 40 MG tablet Take 40 mg by mouth daily as needed.    . insulin NPH-regular Human (NOVOLIN 70/30) (70-30) 100 UNIT/ML injection Inject into the skin. 24 ml in the morning and 20 ml in the evening    . Lancets (ONETOUCH DELICA PLUS [id number]) MISC USE AS DIRECTED 3 TIMES A DAY    . losartan (COZAAR) 100 MG tablet Take 100 mg by mouth daily.     . metFORMIN (GLUCOPHAGE-XR) 500 MG 24 hr tablet Take 1,000 mg by mouth in the morning and at bedtime. PT TAKES 2000 MG DAILY    . MONOJECT INSULIN SYRINGE 31G X 5/16" 1 ML MISC See admin instructions.    . mupirocin ointment (BACTROBAN) 2 % Apply 1 application topically daily.    . naproxen (NAPROSYN) 250 MG tablet Take 1 tablet by mouth in the morning and at bedtime.    Marland Kitchen nystatin cream (MYCOSTATIN) 1 APPLICATION TWICE A DAY EXTERNALLY    . ONETOUCH VERIO test strip     . pravastatin (PRAVACHOL) 40 MG tablet Take 1 tablet once a day for cholesterol    . traMADol (ULTRAM) 50 MG tablet Take 1 tablet (50 mg total) by mouth every 6 (six) hours as needed for moderate pain or severe pain. 20 tablet 0   No  current facility-administered medications for this encounter.    Physical Findings:  height is $RemoveB'5\' 9"'tZPlFpJy$  (1.753 m) and weight is 263 lb 8 oz (119.5 kg). Her temporal temperature is 97.1 F (36.2 C) (abnormal). Her blood pressure is 151/64 (abnormal) and her pulse is 70. Her respiration is 20 and oxygen saturation is 98%. .     General: Alert and oriented, in no acute distress Breast exam reveals excellent healing at central lumpectomy scar, left breast  Lab Findings: Lab Results  Component Value Date   WBC 9.0 01/08/2020   HGB 12.1 01/08/2020   HCT 37.5 01/08/2020   MCV 100.0 01/08/2020   PLT 280 01/08/2020       Radiographic Findings: No results found.  Impression/Plan: Left breast  DCIS  We discussed adjuvant radiotherapy today.  I recommend radiation therapy over 4 weeks to the left breast in order to reduce risk of local recurrence by half.  I reviewed the logistics, benefits, risks, and potential side effects of this treatment in detail. Risks may include but not necessary be limited to acute and late injury tissue in the radiation fields such as skin irritation (change in color/pigmentation, itching, dryness, pain, peeling). She may experience fatigue. We also discussed possible risk of long term cosmetic changes or scar tissue. There is also a smaller risk for lung toxicity, cardiac toxicity, lymphedema, musculoskeletal changes, rib fragility or induction of a second malignancy, late chronic non-healing soft tissue wound.    The patient asked good questions which I answered to her satisfaction. She is enthusiastic about proceeding with treatment. A consent form has been signed and placed in her chart.  We will proceed with CT simulation today and start treatment next week.  We discussed measures to reduce the risk of infection during the COVID-19 pandemic.  As we previously discussed, I recommend that she get vaccinated but she does not wish to.  She knows that if she changes her mind that we do offer the [place] vaccine here at the cancer center.  I talked with her about other ways to mitigate the risk of catching COVID-19, including masking and avoiding contacts during the omicron search.  On date of service, in total, I spent 30 minutes on this encounter. Patient was seen in person.  _____________________________________   Eppie Gibson, MD  This document serves as a record of services personally performed by Eppie Gibson, MD. It was created on his behalf by Clerance Lav, a trained medical scribe. The creation of this record is based on the scribe's personal observations and the provider's statements to them. This document has been checked and approved by the attending  provider.

## 2020-03-27 DIAGNOSIS — D0512 Intraductal carcinoma in situ of left breast: Secondary | ICD-10-CM | POA: Diagnosis not present

## 2020-03-27 DIAGNOSIS — Z510 Encounter for antineoplastic radiation therapy: Secondary | ICD-10-CM | POA: Diagnosis not present

## 2020-03-30 ENCOUNTER — Encounter: Payer: Self-pay | Admitting: *Deleted

## 2020-03-30 ENCOUNTER — Other Ambulatory Visit: Payer: Self-pay

## 2020-03-30 ENCOUNTER — Ambulatory Visit: Payer: PPO | Admitting: Podiatry

## 2020-03-30 ENCOUNTER — Telehealth: Payer: Self-pay | Admitting: Hematology and Oncology

## 2020-03-30 DIAGNOSIS — B351 Tinea unguium: Secondary | ICD-10-CM

## 2020-03-30 DIAGNOSIS — E1142 Type 2 diabetes mellitus with diabetic polyneuropathy: Secondary | ICD-10-CM

## 2020-03-30 DIAGNOSIS — L97522 Non-pressure chronic ulcer of other part of left foot with fat layer exposed: Secondary | ICD-10-CM

## 2020-03-30 DIAGNOSIS — M79674 Pain in right toe(s): Secondary | ICD-10-CM | POA: Diagnosis not present

## 2020-03-30 DIAGNOSIS — M79675 Pain in left toe(s): Secondary | ICD-10-CM

## 2020-03-30 NOTE — Telephone Encounter (Signed)
Scheduled appt per 1/10 sch msg - mailed letter with appt date and time   

## 2020-03-31 ENCOUNTER — Other Ambulatory Visit: Payer: Self-pay

## 2020-03-31 ENCOUNTER — Ambulatory Visit
Admission: RE | Admit: 2020-03-31 | Discharge: 2020-03-31 | Disposition: A | Discharge status: Home / Self Care | Payer: PPO | Source: Ambulatory Visit | Attending: Radiation Oncology | Admitting: Radiation Oncology

## 2020-03-31 DIAGNOSIS — Z510 Encounter for antineoplastic radiation therapy: Secondary | ICD-10-CM | POA: Diagnosis not present

## 2020-03-31 DIAGNOSIS — D0512 Intraductal carcinoma in situ of left breast: Secondary | ICD-10-CM | POA: Diagnosis not present

## 2020-04-01 ENCOUNTER — Other Ambulatory Visit: Payer: Self-pay

## 2020-04-01 ENCOUNTER — Ambulatory Visit
Admission: RE | Admit: 2020-04-01 | Discharge: 2020-04-01 | Disposition: A | Discharge status: Home / Self Care | Payer: PPO | Source: Ambulatory Visit | Attending: Radiation Oncology | Admitting: Radiation Oncology

## 2020-04-01 DIAGNOSIS — Z510 Encounter for antineoplastic radiation therapy: Secondary | ICD-10-CM | POA: Diagnosis not present

## 2020-04-01 DIAGNOSIS — D0512 Intraductal carcinoma in situ of left breast: Secondary | ICD-10-CM | POA: Diagnosis not present

## 2020-04-01 NOTE — Progress Notes (Signed)
Subjective: 70 year old female presents the office today for concerns of thick, discolored toenails that she cannot trim her self as well as for possible wound on the left foot.  She states that around [place] she noticed the callus becoming thicker and some dried blood underneath it.  She has not seen any change or pus.  Denies any red streaks.  No swelling.  She currently denies any fevers, chills, nausea, vomiting.  Objective: AAO x3, NAD Left foot submetatarsal 1 hyperkeratotic lesion with dried blood.  Upon debridement only a ulceration is present measuring 0.6 x 0.4 x 0.1 cm without any drainage or pus there is no surrounding erythema, ascending cellulitis.  There is no fluctuation, crepitation, malodor. Nails appear to be hypertrophic, dystrophic with yellow-brown discoloration x8.  The nails affected are 1, 2, 4, 5 on the left and 2 through 5 on the right. No pain with calf compression, swelling, warmth, erythema  Assessment: 70 year old female left foot ulceration, symptomatic onychomycosis  Plan: -All treatment options discussed with the patient including all alternatives, risks, complications.  -Prior to debridement the wound was not evident after debridement the wound measured minutes per above.  Sharply debrided the wound today utilizing the 312 with scalpel down to healthy, bleeding, viable tissue to promote wound healing.  She has collagen silver at home that she can use.  Currently no signs of infection, monitor closely -Nails debrided x8 without any complications or bleeding  Trula Slade DPM  -Patient encouraged to call the office with any questions, concerns, change in symptoms.

## 2020-04-02 ENCOUNTER — Ambulatory Visit
Admission: RE | Admit: 2020-04-02 | Discharge: 2020-04-02 | Disposition: A | Discharge status: Home / Self Care | Payer: PPO | Source: Ambulatory Visit | Attending: Radiation Oncology | Admitting: Radiation Oncology

## 2020-04-02 DIAGNOSIS — D0512 Intraductal carcinoma in situ of left breast: Secondary | ICD-10-CM | POA: Diagnosis not present

## 2020-04-02 DIAGNOSIS — Z510 Encounter for antineoplastic radiation therapy: Secondary | ICD-10-CM | POA: Diagnosis not present

## 2020-04-03 ENCOUNTER — Other Ambulatory Visit: Payer: Self-pay

## 2020-04-03 ENCOUNTER — Ambulatory Visit
Admission: RE | Admit: 2020-04-03 | Discharge: 2020-04-03 | Disposition: A | Discharge status: Home / Self Care | Payer: PPO | Source: Ambulatory Visit | Attending: Radiation Oncology | Admitting: Radiation Oncology

## 2020-04-03 DIAGNOSIS — D0512 Intraductal carcinoma in situ of left breast: Secondary | ICD-10-CM

## 2020-04-03 DIAGNOSIS — Z510 Encounter for antineoplastic radiation therapy: Secondary | ICD-10-CM | POA: Diagnosis not present

## 2020-04-03 MED ORDER — RADIAPLEXRX EX GEL: Freq: Once | CUTANEOUS | Status: AC

## 2020-04-03 MED ORDER — ALRA NON-METALLIC DEODORANT (RAD-ONC)
1.0000 "application " | Freq: Once | TOPICAL | Status: AC
  Administered 2020-04-03: 1 via TOPICAL

## 2020-04-03 NOTE — Progress Notes (Signed)
Pt here for patient teaching.    Pt given Radiation and You booklet, skin care instructions, Alra deodorant and Radiaplex gel.    Reviewed areas of pertinence such as fatigue, hair loss, skin changes, breast tenderness and breast swelling .   Pt able to give teach back of to pat skin, use unscented/gentle soap and drink plenty of water,apply Radiaplex bid, avoid applying anything to skin within 4 hours of treatment, avoid wearing an under wire bra and to use an electric razor if they must shave.   Pt demonstrated understanding and verbalizes understanding of information given and will contact nursing with any questions or concerns.    Http://rtanswers.org/treatmentinformation/whattoexpect/index          

## 2020-04-06 ENCOUNTER — Ambulatory Visit: Payer: PPO

## 2020-04-07 ENCOUNTER — Other Ambulatory Visit: Payer: Self-pay

## 2020-04-07 ENCOUNTER — Ambulatory Visit
Admission: RE | Admit: 2020-04-07 | Discharge: 2020-04-07 | Disposition: A | Discharge status: Home / Self Care | Payer: PPO | Source: Ambulatory Visit | Attending: Radiation Oncology | Admitting: Radiation Oncology

## 2020-04-07 DIAGNOSIS — I2510 Atherosclerotic heart disease of native coronary artery without angina pectoris: Secondary | ICD-10-CM | POA: Diagnosis not present

## 2020-04-07 DIAGNOSIS — F339 Major depressive disorder, recurrent, unspecified: Secondary | ICD-10-CM | POA: Diagnosis not present

## 2020-04-07 DIAGNOSIS — I10 Essential (primary) hypertension: Secondary | ICD-10-CM | POA: Diagnosis not present

## 2020-04-07 DIAGNOSIS — F3342 Major depressive disorder, recurrent, in full remission: Secondary | ICD-10-CM | POA: Diagnosis not present

## 2020-04-07 DIAGNOSIS — E1140 Type 2 diabetes mellitus with diabetic neuropathy, unspecified: Secondary | ICD-10-CM | POA: Diagnosis not present

## 2020-04-07 DIAGNOSIS — E1121 Type 2 diabetes mellitus with diabetic nephropathy: Secondary | ICD-10-CM | POA: Diagnosis not present

## 2020-04-07 DIAGNOSIS — F331 Major depressive disorder, recurrent, moderate: Secondary | ICD-10-CM | POA: Diagnosis not present

## 2020-04-07 DIAGNOSIS — D0512 Intraductal carcinoma in situ of left breast: Secondary | ICD-10-CM | POA: Diagnosis not present

## 2020-04-07 DIAGNOSIS — Z510 Encounter for antineoplastic radiation therapy: Secondary | ICD-10-CM | POA: Diagnosis not present

## 2020-04-07 DIAGNOSIS — I129 Hypertensive chronic kidney disease with stage 1 through stage 4 chronic kidney disease, or unspecified chronic kidney disease: Secondary | ICD-10-CM | POA: Diagnosis not present

## 2020-04-07 DIAGNOSIS — F325 Major depressive disorder, single episode, in full remission: Secondary | ICD-10-CM | POA: Diagnosis not present

## 2020-04-07 DIAGNOSIS — E1142 Type 2 diabetes mellitus with diabetic polyneuropathy: Secondary | ICD-10-CM | POA: Diagnosis not present

## 2020-04-07 DIAGNOSIS — N183 Chronic kidney disease, stage 3 unspecified: Secondary | ICD-10-CM | POA: Diagnosis not present

## 2020-04-07 DIAGNOSIS — E785 Hyperlipidemia, unspecified: Secondary | ICD-10-CM | POA: Diagnosis not present

## 2020-04-08 ENCOUNTER — Ambulatory Visit
Admission: RE | Admit: 2020-04-08 | Discharge: 2020-04-08 | Disposition: A | Discharge status: Home / Self Care | Payer: PPO | Source: Ambulatory Visit | Attending: Radiation Oncology | Admitting: Radiation Oncology

## 2020-04-08 DIAGNOSIS — Z510 Encounter for antineoplastic radiation therapy: Secondary | ICD-10-CM | POA: Diagnosis not present

## 2020-04-08 DIAGNOSIS — D0512 Intraductal carcinoma in situ of left breast: Secondary | ICD-10-CM | POA: Diagnosis not present

## 2020-04-09 ENCOUNTER — Other Ambulatory Visit: Payer: Self-pay

## 2020-04-09 ENCOUNTER — Encounter: Payer: Self-pay | Admitting: Physical Therapy

## 2020-04-09 ENCOUNTER — Ambulatory Visit
Admission: RE | Admit: 2020-04-09 | Discharge: 2020-04-09 | Disposition: A | Discharge status: Home / Self Care | Payer: PPO | Source: Ambulatory Visit | Attending: Radiation Oncology | Admitting: Radiation Oncology

## 2020-04-09 DIAGNOSIS — Z510 Encounter for antineoplastic radiation therapy: Secondary | ICD-10-CM | POA: Diagnosis not present

## 2020-04-09 DIAGNOSIS — D0512 Intraductal carcinoma in situ of left breast: Secondary | ICD-10-CM | POA: Diagnosis not present

## 2020-04-09 NOTE — Therapy (Signed)
[facility] [address] [place], [place], [address] Phone: [phone]   Fax:  [phone]  Patient Details  Name: [name] MRN: [id number] Date of Birth: September 08, 1950 Referring Provider:  Dr. [name] Encounter Date: 04/09/2020          Physical Therapy Discharge Summary    Pt only attended 1 tx session following initial eval; pt was last seen on 06-14-19.  She reported she was going out of town for a month and would call to schedule follow up appt when she returned, but did not do so.  Pt is discharged due to not returning to PT.   Alda Lea, PT 04/09/2020, 8:42 AM  [facility] [address] [place], [place], [address] Phone: [phone]   Fax:  [phone]

## 2020-04-10 ENCOUNTER — Ambulatory Visit: Payer: PPO

## 2020-04-13 ENCOUNTER — Ambulatory Visit: Payer: PPO | Admitting: Podiatry

## 2020-04-13 ENCOUNTER — Ambulatory Visit
Admission: RE | Admit: 2020-04-13 | Discharge: 2020-04-13 | Disposition: A | Discharge status: Home / Self Care | Payer: PPO | Source: Ambulatory Visit | Attending: Radiation Oncology | Admitting: Radiation Oncology

## 2020-04-13 ENCOUNTER — Other Ambulatory Visit: Payer: Self-pay

## 2020-04-13 DIAGNOSIS — Z510 Encounter for antineoplastic radiation therapy: Secondary | ICD-10-CM | POA: Diagnosis not present

## 2020-04-13 DIAGNOSIS — D0512 Intraductal carcinoma in situ of left breast: Secondary | ICD-10-CM | POA: Diagnosis not present

## 2020-04-13 DIAGNOSIS — E1142 Type 2 diabetes mellitus with diabetic polyneuropathy: Secondary | ICD-10-CM

## 2020-04-13 DIAGNOSIS — L97522 Non-pressure chronic ulcer of other part of left foot with fat layer exposed: Secondary | ICD-10-CM | POA: Diagnosis not present

## 2020-04-14 ENCOUNTER — Ambulatory Visit
Admission: RE | Admit: 2020-04-14 | Discharge: 2020-04-14 | Disposition: A | Discharge status: Home / Self Care | Payer: PPO | Source: Ambulatory Visit | Attending: Radiation Oncology | Admitting: Radiation Oncology

## 2020-04-14 DIAGNOSIS — Z510 Encounter for antineoplastic radiation therapy: Secondary | ICD-10-CM | POA: Diagnosis not present

## 2020-04-14 DIAGNOSIS — D0512 Intraductal carcinoma in situ of left breast: Secondary | ICD-10-CM | POA: Diagnosis not present

## 2020-04-15 ENCOUNTER — Other Ambulatory Visit: Payer: Self-pay

## 2020-04-15 ENCOUNTER — Ambulatory Visit
Admission: RE | Admit: 2020-04-15 | Discharge: 2020-04-15 | Disposition: A | Discharge status: Home / Self Care | Payer: PPO | Source: Ambulatory Visit | Attending: Radiation Oncology | Admitting: Radiation Oncology

## 2020-04-15 DIAGNOSIS — Z510 Encounter for antineoplastic radiation therapy: Secondary | ICD-10-CM | POA: Diagnosis not present

## 2020-04-15 DIAGNOSIS — D0512 Intraductal carcinoma in situ of left breast: Secondary | ICD-10-CM | POA: Diagnosis not present

## 2020-04-15 NOTE — Progress Notes (Signed)
Subjective: 70 year old female presents the office today for follow-up evaluation of an ulcer on the left foot.  She states that the wound was getting better she was not sure what applied the collagen silver dressing.  She denies any drainage or pus any swelling or redness.  Currently denies any fevers chills nausea or vomiting.  No chest pain or shortness of breath.  Awaiting diabetic shoes  Objective: AAO x3, NAD Left foot submetatarsal 1 hyperkeratotic lesion and upon debridement the wound is smaller measuring 0.5 x 0.2 cm and superficial with any surrounding erythema, ascending cellulitis.  No fluctuation crepitation peer there is no malodor. Severe bunion, hammertoes are present. No pain with calf compression, swelling, warmth, erythema  Assessment: 70 year old female left foot ulceration-with improvement  Plan: -All treatment options discussed with the patient including all alternatives, risks, complications.  -I sharply debrided the hyperkeratotic tissue as well as the wound to debride nonviable devitalized tissue to down to healthy, granular tissue to promote wound healing utilizing a #312 with scalpel to any complications.  She tolerated well. -Recommend continue antibiotic ointment dressing changes daily.  Continue offloading. -Monitor for any clinical signs or symptoms of infection and directed to call the office immediately should any occur or go to the ER.  *Diabetic shoe is on backorder until February 12  Trula Slade DPM

## 2020-04-16 ENCOUNTER — Ambulatory Visit
Admission: RE | Admit: 2020-04-16 | Discharge: 2020-04-16 | Disposition: A | Discharge status: Home / Self Care | Payer: PPO | Source: Ambulatory Visit | Attending: Radiation Oncology | Admitting: Radiation Oncology

## 2020-04-16 ENCOUNTER — Other Ambulatory Visit: Payer: Self-pay

## 2020-04-16 DIAGNOSIS — Z510 Encounter for antineoplastic radiation therapy: Secondary | ICD-10-CM | POA: Diagnosis not present

## 2020-04-16 DIAGNOSIS — D0512 Intraductal carcinoma in situ of left breast: Secondary | ICD-10-CM | POA: Diagnosis not present

## 2020-04-17 ENCOUNTER — Ambulatory Visit
Admission: RE | Admit: 2020-04-17 | Discharge: 2020-04-17 | Disposition: A | Discharge status: Home / Self Care | Payer: PPO | Source: Ambulatory Visit | Attending: Radiation Oncology | Admitting: Radiation Oncology

## 2020-04-17 DIAGNOSIS — D0512 Intraductal carcinoma in situ of left breast: Secondary | ICD-10-CM | POA: Diagnosis not present

## 2020-04-17 DIAGNOSIS — Z510 Encounter for antineoplastic radiation therapy: Secondary | ICD-10-CM | POA: Diagnosis not present

## 2020-04-20 ENCOUNTER — Ambulatory Visit: Payer: PPO | Admitting: Radiation Oncology

## 2020-04-20 ENCOUNTER — Ambulatory Visit
Admission: RE | Admit: 2020-04-20 | Discharge: 2020-04-20 | Disposition: A | Discharge status: Home / Self Care | Payer: PPO | Source: Ambulatory Visit | Attending: Radiation Oncology | Admitting: Radiation Oncology

## 2020-04-20 ENCOUNTER — Other Ambulatory Visit: Payer: Self-pay

## 2020-04-20 DIAGNOSIS — D0512 Intraductal carcinoma in situ of left breast: Secondary | ICD-10-CM | POA: Diagnosis not present

## 2020-04-20 DIAGNOSIS — Z510 Encounter for antineoplastic radiation therapy: Secondary | ICD-10-CM | POA: Diagnosis not present

## 2020-04-21 ENCOUNTER — Ambulatory Visit
Admission: RE | Admit: 2020-04-21 | Discharge: 2020-04-21 | Disposition: A | Discharge status: Home / Self Care | Payer: PPO | Source: Ambulatory Visit | Attending: Radiation Oncology | Admitting: Radiation Oncology

## 2020-04-21 ENCOUNTER — Ambulatory Visit: Payer: PPO

## 2020-04-21 DIAGNOSIS — D0512 Intraductal carcinoma in situ of left breast: Secondary | ICD-10-CM | POA: Diagnosis not present

## 2020-04-22 ENCOUNTER — Other Ambulatory Visit: Payer: Self-pay

## 2020-04-22 ENCOUNTER — Ambulatory Visit: Payer: PPO

## 2020-04-22 ENCOUNTER — Ambulatory Visit
Admission: RE | Admit: 2020-04-22 | Discharge: 2020-04-22 | Disposition: A | Discharge status: Home / Self Care | Payer: PPO | Source: Ambulatory Visit | Attending: Radiation Oncology | Admitting: Radiation Oncology

## 2020-04-22 DIAGNOSIS — D0512 Intraductal carcinoma in situ of left breast: Secondary | ICD-10-CM | POA: Diagnosis not present

## 2020-04-23 ENCOUNTER — Ambulatory Visit
Admission: RE | Admit: 2020-04-23 | Discharge: 2020-04-23 | Disposition: A | Discharge status: Home / Self Care | Payer: PPO | Source: Ambulatory Visit | Attending: Radiation Oncology | Admitting: Radiation Oncology

## 2020-04-23 ENCOUNTER — Encounter: Payer: Self-pay | Admitting: *Deleted

## 2020-04-23 ENCOUNTER — Other Ambulatory Visit: Payer: Self-pay

## 2020-04-23 ENCOUNTER — Telehealth: Payer: Self-pay | Admitting: Podiatry

## 2020-04-23 DIAGNOSIS — D0512 Intraductal carcinoma in situ of left breast: Secondary | ICD-10-CM | POA: Diagnosis not present

## 2020-04-23 NOTE — Telephone Encounter (Signed)
Received call from [facility] team advantage and they were sending the diabetic shoe auth for review with denial since we did not have a current certifying physicians statement from the pcp. I have faxed it just waiting on it to come back They wanted to try to do a peer to peer but without that statement it could be denied. I was trying to just get an extension as her previous auth expired before pt got the shoes and inserts. I asked them to just withdrawal it until we get the certifying physicians statement and I will resubmit it.

## 2020-04-24 ENCOUNTER — Other Ambulatory Visit: Payer: Self-pay

## 2020-04-24 ENCOUNTER — Ambulatory Visit
Admission: RE | Admit: 2020-04-24 | Discharge: 2020-04-24 | Disposition: A | Discharge status: Home / Self Care | Payer: PPO | Source: Ambulatory Visit | Attending: Radiation Oncology | Admitting: Radiation Oncology

## 2020-04-24 DIAGNOSIS — D0512 Intraductal carcinoma in situ of left breast: Secondary | ICD-10-CM | POA: Diagnosis not present

## 2020-04-27 ENCOUNTER — Ambulatory Visit
Admission: RE | Admit: 2020-04-27 | Discharge: 2020-04-27 | Disposition: A | Discharge status: Home / Self Care | Payer: PPO | Source: Ambulatory Visit | Attending: Radiation Oncology | Admitting: Radiation Oncology

## 2020-04-27 ENCOUNTER — Other Ambulatory Visit: Payer: Self-pay

## 2020-04-27 ENCOUNTER — Ambulatory Visit: Payer: PPO

## 2020-04-27 DIAGNOSIS — D0512 Intraductal carcinoma in situ of left breast: Secondary | ICD-10-CM | POA: Diagnosis not present

## 2020-04-27 MED ORDER — RADIAPLEXRX EX GEL: Freq: Once | CUTANEOUS | Status: AC

## 2020-04-28 ENCOUNTER — Ambulatory Visit: Payer: PPO | Admitting: Podiatry

## 2020-04-28 ENCOUNTER — Other Ambulatory Visit: Payer: Self-pay

## 2020-04-28 ENCOUNTER — Ambulatory Visit: Payer: PPO

## 2020-04-28 ENCOUNTER — Ambulatory Visit
Admission: RE | Admit: 2020-04-28 | Discharge: 2020-04-28 | Disposition: A | Discharge status: Home / Self Care | Payer: PPO | Source: Ambulatory Visit | Attending: Radiation Oncology | Admitting: Radiation Oncology

## 2020-04-28 DIAGNOSIS — D0512 Intraductal carcinoma in situ of left breast: Secondary | ICD-10-CM | POA: Diagnosis not present

## 2020-04-28 DIAGNOSIS — L97522 Non-pressure chronic ulcer of other part of left foot with fat layer exposed: Secondary | ICD-10-CM

## 2020-04-28 DIAGNOSIS — E1142 Type 2 diabetes mellitus with diabetic polyneuropathy: Secondary | ICD-10-CM

## 2020-04-29 ENCOUNTER — Encounter: Payer: Self-pay | Admitting: Radiation Oncology

## 2020-04-29 ENCOUNTER — Other Ambulatory Visit: Payer: Self-pay

## 2020-04-29 ENCOUNTER — Ambulatory Visit
Admission: RE | Admit: 2020-04-29 | Discharge: 2020-04-29 | Disposition: A | Discharge status: Home / Self Care | Payer: PPO | Source: Ambulatory Visit | Attending: Radiation Oncology | Admitting: Radiation Oncology

## 2020-04-29 DIAGNOSIS — D0512 Intraductal carcinoma in situ of left breast: Secondary | ICD-10-CM | POA: Diagnosis not present

## 2020-04-29 NOTE — Progress Notes (Signed)
HEMATOLOGY-ONCOLOGY MYCHART VIDEO VISIT PROGRESS NOTE  I connected with [name] on 04/30/2020 at  2:15 PM EST by MyChart video conference and verified that I am speaking with the correct person using two identifiers.  I discussed the limitations, risks, security and privacy concerns of performing an evaluation and management service by MyChart and the availability of in person appointments.  I also discussed with the patient that there may be a patient responsible charge related to this service. The patient expressed understanding and agreed to proceed.    CHIEF COMPLIANT: Follow-up of left breast DCIS   INTERVAL HISTORY: [name] is a 70 y.o. female with above-mentioned history of left breast DCIS who underwent a left lumpectomy and completed radiation yesterday, 04/29/20. She presents over MyChart today for follow-up. Mild radiation dermatitis.  Oncology History  Ductal carcinoma in situ (DCIS) of left breast  01/02/2020 Initial Diagnosis   Screening mammogram showed two groups of calcifications in the inferior medial quadrant of the left breast, 0.5cm and 0.9cm. Biopsy showed DCIS, grade 3, ER/PR negative.    01/08/2020 Cancer Staging   Staging form: Breast, AJCC 8th Edition - Clinical stage from 01/08/2020: Stage 0 (cTis (DCIS), cN0, cM0, ER-, PR-, HER2: Not Assessed) - Signed by Nicholas Lose, MD on 01/08/2020   02/25/2020 Surgery   Left lumpectomy Ninfa Linden): high grade DCIS, 1.4cm, clear margins   04/01/2020 - 04/29/2020 Radiation Therapy   Adjuvant left breast radiation      Observations/Objective:  There were no vitals filed for this visit. There is no height or weight on file to calculate BMI.  I have reviewed the data as listed CMP Latest Ref Rng & Units 02/21/2020 01/08/2020 01/03/2017  Glucose 70 - 99 mg/dL 154(H) 180(H) 89  BUN 8 - 23 mg/dL 26(H) 24(H) 21  Creatinine 0.44 - 1.00 mg/dL 1.09(H) 1.25(H) 0.87  Sodium 135 - 145 mmol/L 137 136 136  Potassium 3.5  - 5.1 mmol/L 4.8 4.6 4.4  Chloride 98 - 111 mmol/L 101 103 106  CO2 22 - 32 mmol/L $RemoveB'25 26 23  'LwXnryng$ Calcium 8.9 - 10.3 mg/dL 9.9 10.0 9.4  Total Protein 6.5 - 8.1 g/dL - 7.6 7.1  Total Bilirubin 0.3 - 1.2 mg/dL - 0.3 0.6  Alkaline Phos 38 - 126 U/L - 56 -  AST 15 - 41 U/L - 18 17  ALT 0 - 44 U/L - 20 13    Lab Results  Component Value Date   WBC 9.0 01/08/2020   HGB 12.1 01/08/2020   HCT 37.5 01/08/2020   MCV 100.0 01/08/2020   PLT 280 01/08/2020   NEUTROABS 5.1 01/08/2020      Assessment Plan:  Ductal carcinoma in situ (DCIS) of left breast 01/02/2020: Screening mammogram showed two groups of calcifications in the inferior medial quadrant of the left breast, 0.5cm and 0.9cm. Biopsy showed DCIS, grade 3, ER/PR negative.  02/25/20: Left lumpectomy Ninfa Linden): high grade DCIS, 1.4cm, clear margins 04/02/19-04/29/20: XRT  Tamoxifen Counseling: We discussed the risks and benefits of tamoxifen. These include but not limited to insomnia, hot flashes, mood changes, vaginal dryness, and weight gain. Although rare, serious side effects including risk of blood clots were also discussed. We strongly believe that the benefits far outweigh the risks. Patient understands these risks and consented to starting treatment. Planned treatment duration is 5 years.  Patient had prior hysterectomy.  RTC in 31months for SCP visit    I discussed the assessment and treatment plan with the patient. The patient  was provided an opportunity to ask questions and all were answered. The patient agreed with the plan and demonstrated an understanding of the instructions. The patient was advised to call back or seek an in-person evaluation if the symptoms worsen or if the condition fails to improve as anticipated.   Total time spent: 20 minutes including face-to-face MyChart video visit time and time spent for planning, charting and coordination of care  Rulon Eisenmenger, MD 04/30/2020   I, Cloyde Reams Dorshimer, am acting as  scribe for Nicholas Lose, MD.  I have reviewed the above documentation for accuracy and completeness, and I agree with the above.

## 2020-04-30 ENCOUNTER — Inpatient Hospital Stay: Payer: PPO | Attending: Hematology and Oncology | Admitting: Hematology and Oncology

## 2020-04-30 DIAGNOSIS — D0512 Intraductal carcinoma in situ of left breast: Secondary | ICD-10-CM

## 2020-04-30 MED ORDER — TAMOXIFEN CITRATE 20 MG PO TABS: 20.0000 mg | ORAL_TABLET | Freq: Every day | ORAL | 3 refills | Status: DC

## 2020-04-30 NOTE — Assessment & Plan Note (Signed)
01/02/2020: Screening mammogram showed two groups of calcifications in the inferior medial quadrant of the left breast, 0.5cm and 0.9cm. Biopsy showed DCIS, grade 3, ER/PR negative.  02/25/20: Left lumpectomy [name]): high grade DCIS, 1.4cm, clear margins 04/02/19-04/29/20: XRT  Tamoxifen Counseling: We discussed the risks and benefits of tamoxifen. These include but not limited to insomnia, hot flashes, mood changes, vaginal dryness, and weight gain. Although rare, serious side effects including endometrial cancer, risk of blood clots were also discussed. We strongly believe that the benefits far outweigh the risks. Patient understands these risks and consented to starting treatment. Planned treatment duration is 5 years.  RTC in 17months for SCP visit

## 2020-05-04 DIAGNOSIS — E1142 Type 2 diabetes mellitus with diabetic polyneuropathy: Secondary | ICD-10-CM | POA: Diagnosis not present

## 2020-05-04 DIAGNOSIS — M47812 Spondylosis without myelopathy or radiculopathy, cervical region: Secondary | ICD-10-CM | POA: Diagnosis not present

## 2020-05-04 DIAGNOSIS — N183 Chronic kidney disease, stage 3 unspecified: Secondary | ICD-10-CM | POA: Diagnosis not present

## 2020-05-04 DIAGNOSIS — I129 Hypertensive chronic kidney disease with stage 1 through stage 4 chronic kidney disease, or unspecified chronic kidney disease: Secondary | ICD-10-CM | POA: Diagnosis not present

## 2020-05-04 DIAGNOSIS — C50912 Malignant neoplasm of unspecified site of left female breast: Secondary | ICD-10-CM | POA: Diagnosis not present

## 2020-05-04 DIAGNOSIS — E11319 Type 2 diabetes mellitus with unspecified diabetic retinopathy without macular edema: Secondary | ICD-10-CM | POA: Diagnosis not present

## 2020-05-04 DIAGNOSIS — F339 Major depressive disorder, recurrent, unspecified: Secondary | ICD-10-CM | POA: Diagnosis not present

## 2020-05-04 DIAGNOSIS — F3341 Major depressive disorder, recurrent, in partial remission: Secondary | ICD-10-CM | POA: Diagnosis not present

## 2020-05-04 DIAGNOSIS — E1140 Type 2 diabetes mellitus with diabetic neuropathy, unspecified: Secondary | ICD-10-CM | POA: Diagnosis not present

## 2020-05-04 DIAGNOSIS — E1121 Type 2 diabetes mellitus with diabetic nephropathy: Secondary | ICD-10-CM | POA: Diagnosis not present

## 2020-05-04 DIAGNOSIS — E785 Hyperlipidemia, unspecified: Secondary | ICD-10-CM | POA: Diagnosis not present

## 2020-05-05 DIAGNOSIS — Z89411 Acquired absence of right great toe: Secondary | ICD-10-CM | POA: Diagnosis not present

## 2020-05-05 DIAGNOSIS — E1142 Type 2 diabetes mellitus with diabetic polyneuropathy: Secondary | ICD-10-CM | POA: Diagnosis not present

## 2020-05-05 DIAGNOSIS — Z794 Long term (current) use of insulin: Secondary | ICD-10-CM | POA: Diagnosis not present

## 2020-05-05 DIAGNOSIS — L97509 Non-pressure chronic ulcer of other part of unspecified foot with unspecified severity: Secondary | ICD-10-CM | POA: Diagnosis not present

## 2020-05-05 NOTE — Progress Notes (Signed)
Subjective: 70 year old female presents the office today for follow-up evaluation of an ulcer on the left foot.  She is continue daily dressing changes.  Denies any drainage or pus or any increase in swelling.  She has noticed a small possible blister adjacent to the wound.  No other concerns today.  Currently denies any fevers, chills, nausea vomiting.  No calf pain, chest pain, shortness of breath.  She has apparently denied diabetic shoes.  Objective: AAO x3, NAD Left foot submetatarsal 1 hyperkeratotic lesion and upon debridement the wound is smaller measuring 0.3 x 0.2 cm however adjacent to this there was a small blood blister.  I did able to debride this and there is no drainage or pus identified.  There is no surrounding erythema, ascending cellulitis but there is no fluctuation or crepitation but there is no malodor. Severe bunion, hammertoes are present. No pain with calf compression, swelling, warmth, erythema  Assessment: 70 year old female left foot ulceration-with improvement  Plan: -All treatment options discussed with the patient including all alternatives, risks, complications.  -I sharply debrided the hyperkeratotic tissue as well as the wound to debride nonviable devitalized tissue to down to healthy, granular tissue to promote wound healing utilizing a #312 with scalpel to any complications.  She tolerated well. -Continue daily dressing changes. -Offloading at all times. -Monitor for any clinical signs or symptoms of infection and directed to call the office immediately should any occur or go to the ER.  Trula Slade DPM

## 2020-05-06 DIAGNOSIS — E785 Hyperlipidemia, unspecified: Secondary | ICD-10-CM | POA: Diagnosis not present

## 2020-05-06 DIAGNOSIS — G4733 Obstructive sleep apnea (adult) (pediatric): Secondary | ICD-10-CM | POA: Diagnosis not present

## 2020-05-06 DIAGNOSIS — Z1159 Encounter for screening for other viral diseases: Secondary | ICD-10-CM | POA: Diagnosis not present

## 2020-05-06 DIAGNOSIS — I129 Hypertensive chronic kidney disease with stage 1 through stage 4 chronic kidney disease, or unspecified chronic kidney disease: Secondary | ICD-10-CM | POA: Diagnosis not present

## 2020-05-06 DIAGNOSIS — E1140 Type 2 diabetes mellitus with diabetic neuropathy, unspecified: Secondary | ICD-10-CM | POA: Diagnosis not present

## 2020-05-06 DIAGNOSIS — E11319 Type 2 diabetes mellitus with unspecified diabetic retinopathy without macular edema: Secondary | ICD-10-CM | POA: Diagnosis not present

## 2020-05-06 DIAGNOSIS — N183 Chronic kidney disease, stage 3 unspecified: Secondary | ICD-10-CM | POA: Diagnosis not present

## 2020-05-06 DIAGNOSIS — C50912 Malignant neoplasm of unspecified site of left female breast: Secondary | ICD-10-CM | POA: Diagnosis not present

## 2020-05-06 DIAGNOSIS — Z89421 Acquired absence of other right toe(s): Secondary | ICD-10-CM | POA: Diagnosis not present

## 2020-05-06 DIAGNOSIS — E1121 Type 2 diabetes mellitus with diabetic nephropathy: Secondary | ICD-10-CM | POA: Diagnosis not present

## 2020-05-06 DIAGNOSIS — F339 Major depressive disorder, recurrent, unspecified: Secondary | ICD-10-CM | POA: Diagnosis not present

## 2020-05-06 DIAGNOSIS — Z79899 Other long term (current) drug therapy: Secondary | ICD-10-CM | POA: Diagnosis not present

## 2020-05-06 DIAGNOSIS — Z0000 Encounter for general adult medical examination without abnormal findings: Secondary | ICD-10-CM | POA: Diagnosis not present

## 2020-05-12 ENCOUNTER — Ambulatory Visit: Payer: PPO | Admitting: Orthotics

## 2020-05-12 ENCOUNTER — Other Ambulatory Visit: Payer: Self-pay

## 2020-05-12 ENCOUNTER — Ambulatory Visit: Payer: PPO | Admitting: Podiatry

## 2020-05-12 DIAGNOSIS — Z89411 Acquired absence of right great toe: Secondary | ICD-10-CM

## 2020-05-12 DIAGNOSIS — Z89422 Acquired absence of other left toe(s): Secondary | ICD-10-CM

## 2020-05-12 DIAGNOSIS — L97522 Non-pressure chronic ulcer of other part of left foot with fat layer exposed: Secondary | ICD-10-CM

## 2020-05-12 DIAGNOSIS — E1142 Type 2 diabetes mellitus with diabetic polyneuropathy: Secondary | ICD-10-CM

## 2020-05-16 NOTE — Progress Notes (Signed)
Subjective: 70 year old female presents the office today for follow-up evaluation of an ulcer on the left foot.  She does continue daily dressing changes.  She does not see any drainage or pus.  No surrounding redness or swelling or any red streaks.  Denies any fevers, chills, nausea, vomiting.  Denies any calf pain, chest pain, shortness of breath.   She has apparently now been approved for diabetic shoes.  Objective: AAO x3, NAD Left foot submetatarsal 1 hyperkeratotic lesion appears almost to have splinted the skin is formed.  There is a longitudinal wound today which is new and superficial.  Appears to be almost a skin fissure type bleeding.  There is no drainage or pus there is no probing, undermining or tunneling.  There is no surrounding erythema, ascending cellulitis.  There is no fluctuation, crepitation, malodor.   Severe bunion, hammertoes are present. No pain with calf compression, swelling, warmth, erythema  Assessment: 70 year old female left foot ulceration-with improvement  Plan: -All treatment options discussed with the patient including all alternatives, risks, complications.  -I sharply debrided the hyperkeratotic tissue as well as the wound to debride nonviable devitalized tissue to down to healthy, granular tissue to promote wound healing utilizing a #312 with scalpel to any complications.  She tolerated well. -Continue daily dressing changes. -Offloading at all times. -Followed up with Liliane Channel, our pedorthotist, today for diabetic shoes.  -Monitor for any clinical signs or symptoms of infection and directed to call the office immediately should any occur or go to the ER.  Trula Slade DPM

## 2020-05-19 DIAGNOSIS — E1142 Type 2 diabetes mellitus with diabetic polyneuropathy: Secondary | ICD-10-CM | POA: Diagnosis not present

## 2020-05-19 DIAGNOSIS — E785 Hyperlipidemia, unspecified: Secondary | ICD-10-CM | POA: Diagnosis not present

## 2020-05-19 DIAGNOSIS — E11319 Type 2 diabetes mellitus with unspecified diabetic retinopathy without macular edema: Secondary | ICD-10-CM | POA: Diagnosis not present

## 2020-05-19 DIAGNOSIS — F3341 Major depressive disorder, recurrent, in partial remission: Secondary | ICD-10-CM | POA: Diagnosis not present

## 2020-05-19 DIAGNOSIS — N183 Chronic kidney disease, stage 3 unspecified: Secondary | ICD-10-CM | POA: Diagnosis not present

## 2020-05-19 DIAGNOSIS — E1140 Type 2 diabetes mellitus with diabetic neuropathy, unspecified: Secondary | ICD-10-CM | POA: Diagnosis not present

## 2020-05-19 DIAGNOSIS — E1121 Type 2 diabetes mellitus with diabetic nephropathy: Secondary | ICD-10-CM | POA: Diagnosis not present

## 2020-05-19 DIAGNOSIS — I129 Hypertensive chronic kidney disease with stage 1 through stage 4 chronic kidney disease, or unspecified chronic kidney disease: Secondary | ICD-10-CM | POA: Diagnosis not present

## 2020-05-26 ENCOUNTER — Other Ambulatory Visit: Payer: Self-pay

## 2020-05-26 ENCOUNTER — Ambulatory Visit: Payer: PPO | Admitting: Podiatry

## 2020-05-26 DIAGNOSIS — L97522 Non-pressure chronic ulcer of other part of left foot with fat layer exposed: Secondary | ICD-10-CM

## 2020-05-26 DIAGNOSIS — E1142 Type 2 diabetes mellitus with diabetic polyneuropathy: Secondary | ICD-10-CM

## 2020-05-29 ENCOUNTER — Telehealth: Payer: Self-pay

## 2020-05-29 ENCOUNTER — Ambulatory Visit: Payer: PPO | Admitting: Radiation Oncology

## 2020-05-29 NOTE — Telephone Encounter (Signed)
I called the patient today about her upcoming follow-up appointment in radiation oncology.   Given the state of the COVID-19 pandemic, concerning case numbers in our community, and guidance from [facility], I offered a phone assessment with the patient to determine if coming to the clinic was necessary. She accepted.  I let the patient know that I had spoken with Dr. Isidore Moos, and she wanted them to know the importance of washing their hands for at least 20 seconds at a time, especially after going out in public, and before they eat.  Limit going out in public whenever possible. Do not touch your face, unless your hands are clean, such as when bathing. Get plenty of rest, eat well, and stay hydrated. Patient verbalized understanding and agreement  The patient denies any symptomatic concerns. She reports her fatigue has improved, and she has been able to get outside and enjoy the warmer weather which has helped her mood. She denies any lingering pain, swelling, or tenderness to her left breast/axilla. She denies any changes or worsening in range of motion limitations to her left shoulder/arm (states she had issues prior to treatment, and reports they are still stable). She reports she's trying to eat less processed foods, and feels her diet/overall health have improved. She did experience some small areas of blistering to her skin in the radiation field, but states they have now resolved. Specifically, she reports good healing of her skin in the radiation fields.  Skin is intact and returning there her baseline coloring/texture. I recommended that she continue skin care by applying oil or lotion with vitamin E to the skin in the radiation fields, BID, for 2 more months.  Patient verbalized understanding and agreement.  Continue follow-up with medical oncology - follow-up is scheduled on 08/28/2020 with Mendel Ryder Causey-NP in the [place].  I explained that yearly mammograms are important  for patients with intact breast tissue, and physical exams are important after mastectomy for patients that cannot undergo mammography.  I encouraged her to call if she had further questions or concerns about her healing. Otherwise, she will follow-up PRN in radiation oncology. Patient is pleased with this plan, and we will cancel her upcoming follow-up to reduce the risk of COVID-19 transmission.

## 2020-05-31 NOTE — Progress Notes (Signed)
Subjective: 70 year old female presents the office today for follow-up evaluation of an ulcer on the left foot.  She has some very minimal bloody drainage but denies any pus.  No swelling or redness that she reports.  She has no pain. Denies any fevers, chills, nausea, vomiting.  Denies any calf pain, chest pain, shortness of breath.   Objective: AAO x3, NAD Left foot submetatarsal 1 hyperkeratotic lesion which continues to be almost a skin fissure type lesion is smaller today.  Measure about 0.8cm in length and is superficial without any probing, undermining or tunneling.  There is no surrounding erythema, ascending cellulitis there is no fluctuation capitation there is no malodor. Severe bunion, hammertoes are present. No pain with calf compression, swelling, warmth, erythema  Assessment: 70 year old female left foot ulceration-improving  Plan: -All treatment options discussed with the patient including all alternatives, risks, complications.  -I sharply debrided the hyperkeratotic tissue as well as the wound to debride nonviable devitalized tissue to down to healthy, granular tissue to promote wound healing utilizing a #312 with scalpel to any complications.  She tolerated well. -Continue daily dressing changes. -Offloading at all times. -Monitor for any clinical signs or symptoms of infection and directed to call the office immediately should any occur or go to the ER.  Trula Slade DPM

## 2020-06-04 DIAGNOSIS — G8314 Monoplegia of lower limb affecting left nondominant side: Secondary | ICD-10-CM | POA: Diagnosis not present

## 2020-06-04 DIAGNOSIS — E1122 Type 2 diabetes mellitus with diabetic chronic kidney disease: Secondary | ICD-10-CM | POA: Diagnosis not present

## 2020-06-04 DIAGNOSIS — F3342 Major depressive disorder, recurrent, in full remission: Secondary | ICD-10-CM | POA: Diagnosis not present

## 2020-06-04 DIAGNOSIS — I7300 Raynaud's syndrome without gangrene: Secondary | ICD-10-CM | POA: Diagnosis not present

## 2020-06-04 DIAGNOSIS — C50912 Malignant neoplasm of unspecified site of left female breast: Secondary | ICD-10-CM | POA: Diagnosis not present

## 2020-06-04 DIAGNOSIS — D692 Other nonthrombocytopenic purpura: Secondary | ICD-10-CM | POA: Diagnosis not present

## 2020-06-04 DIAGNOSIS — E1142 Type 2 diabetes mellitus with diabetic polyneuropathy: Secondary | ICD-10-CM | POA: Diagnosis not present

## 2020-06-04 DIAGNOSIS — I129 Hypertensive chronic kidney disease with stage 1 through stage 4 chronic kidney disease, or unspecified chronic kidney disease: Secondary | ICD-10-CM | POA: Diagnosis not present

## 2020-06-04 DIAGNOSIS — E11621 Type 2 diabetes mellitus with foot ulcer: Secondary | ICD-10-CM | POA: Diagnosis not present

## 2020-06-04 DIAGNOSIS — I429 Cardiomyopathy, unspecified: Secondary | ICD-10-CM | POA: Diagnosis not present

## 2020-06-04 DIAGNOSIS — E785 Hyperlipidemia, unspecified: Secondary | ICD-10-CM | POA: Diagnosis not present

## 2020-06-09 NOTE — Progress Notes (Signed)
  Patient Name: [name] MRN: [id number] DOB: 1950/07/09 Referring Physician: Coralie Keens (Profile Not Attached) Date of Service: 04/29/2020 [facility], [place]                                                        End Of Treatment Note  Diagnoses: D05.12-Intraductal carcinoma in situ of left breast  Cancer Staging: Cancer Staging Ductal carcinoma in situ (DCIS) of left breast Staging form: Breast, AJCC 8th Edition - Clinical stage from 01/08/2020: Stage 0 (cTis (DCIS), cN0, cM0, ER-, PR-, HER2: Not Assessed) - Signed by Nicholas Lose, MD on 01/08/2020 Stage prefix: Initial diagnosis Nuclear grade: G3 Laterality: Left Staged by: Pathologist and managing physician Stage used in treatment planning: Yes National guidelines used in treatment planning: Yes Type of national guideline used in treatment planning: NCCN  Intent: Curative  Radiation Treatment Dates: 03/31/2020 through 04/29/2020 Site Technique Total Dose (Gy) Dose per Fx (Gy) Completed Fx Beam Energies  Breast, Left: Breast_Lt 3D 40.05/40.05 2.67 15/15 6X  Breast, Left: Breast_Lt_Bst 3D 10/10 2 5/5 6X   Narrative: The patient tolerated radiation therapy relatively well.   Plan: The patient will follow-up with radiation oncology in 17mo  -----------------------------------  SEppie Gibson MD

## 2020-06-11 ENCOUNTER — Ambulatory Visit: Payer: PPO | Admitting: Podiatry

## 2020-06-15 ENCOUNTER — Telehealth: Payer: Self-pay | Admitting: Podiatry

## 2020-06-15 NOTE — Telephone Encounter (Signed)
That is fine with me! Please add it to the notes section so I don't forget

## 2020-06-15 NOTE — Telephone Encounter (Signed)
Patient would like to know if she could get nail trim when she comes in for wound check, I informed patient I would need to ask first. Please Advise

## 2020-06-16 ENCOUNTER — Telehealth: Payer: Self-pay | Admitting: Podiatry

## 2020-06-16 NOTE — Telephone Encounter (Signed)
Called patient and LVM to inform patient that [name] gave the ok for nail trim during wound check visit on 3/31

## 2020-06-18 ENCOUNTER — Other Ambulatory Visit: Payer: Self-pay

## 2020-06-18 ENCOUNTER — Ambulatory Visit: Payer: PPO | Admitting: Podiatry

## 2020-06-18 DIAGNOSIS — M79675 Pain in left toe(s): Secondary | ICD-10-CM

## 2020-06-18 DIAGNOSIS — B351 Tinea unguium: Secondary | ICD-10-CM

## 2020-06-18 DIAGNOSIS — E1142 Type 2 diabetes mellitus with diabetic polyneuropathy: Secondary | ICD-10-CM | POA: Diagnosis not present

## 2020-06-18 DIAGNOSIS — M79674 Pain in right toe(s): Secondary | ICD-10-CM | POA: Diagnosis not present

## 2020-06-18 DIAGNOSIS — L97522 Non-pressure chronic ulcer of other part of left foot with fat layer exposed: Secondary | ICD-10-CM | POA: Diagnosis not present

## 2020-06-18 NOTE — Progress Notes (Signed)
Subjective: 70 year old female presents the office today for follow-up evaluation of an ulcer on the left foot.  Also asking for the nails be trimmed today as are thickened elongated she cannot do them herself.  Denies any swelling or redness or any drainage.  She has no other concerns today. Denies any fevers, chills, nausea, vomiting.  Denies any calf pain, chest pain, shortness of breath.   Objective: AAO x3, NAD Left foot submetatarsal 1 hyperkeratotic lesion which continues to be almost a skin fissure type lesion is smaller today.  It is very narrow and appears to be almost healed.  There is no probing, undermining or tunneling.  No edema, erythema or obvious signs of infection.  The Nails appear to be hypertrophic, dystrophic with yellow discoloration.  Nails affected her left hallux, left second toe, left fourth toe, left fifth toe as well as the right second, third, fourth, fifth digits.  No edema, erythema or signs of infection. Severe bunion, hammertoes are present. No pain with calf compression, swelling, warmth, erythema  Assessment: 70 year old female left foot ulceration-improving; symptomatic onychomycosis  Plan: -All treatment options discussed with the patient including all alternatives, risks, complications.  -I sharply debrided the hyperkeratotic tissue as well as the wound to debride nonviable devitalized tissue to down to healthy, granular tissue to promote wound healing utilizing a #312 with scalpel to any complications.  She tolerated well.  Continue with daily dressing changes.  Offloading at all times. -Sharply debrided nails x8 without any complications or bleeding. -Monitor for any clinical signs or symptoms of infection and directed to call the office immediately should any occur or go to the ER.  RTC 3 weeks or sooner if needed  Trula Slade DPM

## 2020-06-22 DIAGNOSIS — E1142 Type 2 diabetes mellitus with diabetic polyneuropathy: Secondary | ICD-10-CM | POA: Diagnosis not present

## 2020-06-22 DIAGNOSIS — M47812 Spondylosis without myelopathy or radiculopathy, cervical region: Secondary | ICD-10-CM | POA: Diagnosis not present

## 2020-06-22 DIAGNOSIS — E11319 Type 2 diabetes mellitus with unspecified diabetic retinopathy without macular edema: Secondary | ICD-10-CM | POA: Diagnosis not present

## 2020-06-22 DIAGNOSIS — E1121 Type 2 diabetes mellitus with diabetic nephropathy: Secondary | ICD-10-CM | POA: Diagnosis not present

## 2020-06-22 DIAGNOSIS — I129 Hypertensive chronic kidney disease with stage 1 through stage 4 chronic kidney disease, or unspecified chronic kidney disease: Secondary | ICD-10-CM | POA: Diagnosis not present

## 2020-06-22 DIAGNOSIS — F339 Major depressive disorder, recurrent, unspecified: Secondary | ICD-10-CM | POA: Diagnosis not present

## 2020-06-22 DIAGNOSIS — E1140 Type 2 diabetes mellitus with diabetic neuropathy, unspecified: Secondary | ICD-10-CM | POA: Diagnosis not present

## 2020-06-22 DIAGNOSIS — C50912 Malignant neoplasm of unspecified site of left female breast: Secondary | ICD-10-CM | POA: Diagnosis not present

## 2020-06-22 DIAGNOSIS — E785 Hyperlipidemia, unspecified: Secondary | ICD-10-CM | POA: Diagnosis not present

## 2020-06-22 DIAGNOSIS — N183 Chronic kidney disease, stage 3 unspecified: Secondary | ICD-10-CM | POA: Diagnosis not present

## 2020-06-22 DIAGNOSIS — F3341 Major depressive disorder, recurrent, in partial remission: Secondary | ICD-10-CM | POA: Diagnosis not present

## 2020-07-09 ENCOUNTER — Other Ambulatory Visit: Payer: Self-pay

## 2020-07-09 ENCOUNTER — Ambulatory Visit: Payer: PPO | Admitting: Podiatry

## 2020-07-09 DIAGNOSIS — L97522 Non-pressure chronic ulcer of other part of left foot with fat layer exposed: Secondary | ICD-10-CM

## 2020-07-09 DIAGNOSIS — E1142 Type 2 diabetes mellitus with diabetic polyneuropathy: Secondary | ICD-10-CM

## 2020-07-14 IMAGING — MR MR HEAD W/O CM
10 series · 48 of 48 positions shown · non-contrast
Comparison: Head CT 05/14/2019

CLINICAL DATA: New onset headaches and memory loss. Recent falls.

EXAM:
MRI HEAD WITHOUT CONTRAST
TECHNIQUE: Multiplanar, multiecho pulse sequences of the brain and surrounding
structures were obtained without intravenous contrast.

[Series 2: t1_se_sag · sagittal · 5.0mm · 0.45mm/px · 1 of 19 slices shown]
[im 1/19]
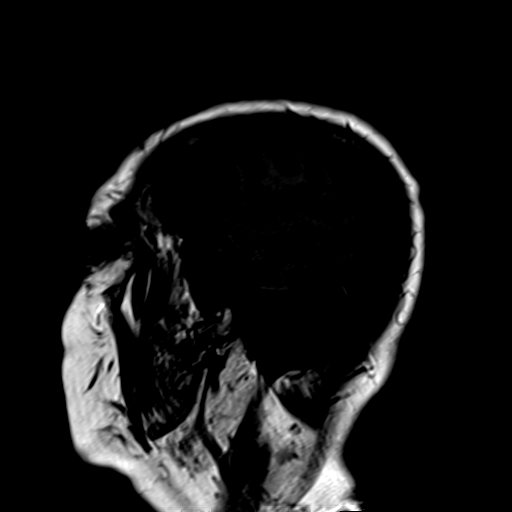

[Series 3: t2_tse_tra_512 · axial · 5.0mm · 0.72mm/px · z∈[-19,+129]mm · 3 of 26 slices shown]
[im 1/26]
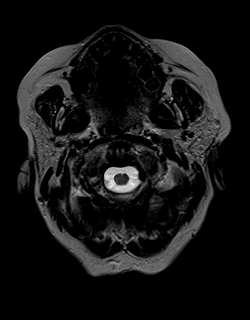
[im 13/26]
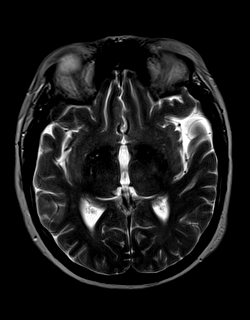
[im 26/26]
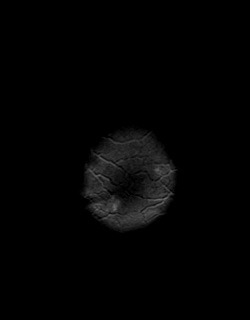

[Series 4: ep2d_diff_ · axial · 3.0mm · 1.80mm/px · z∈[-16,+127]mm · 9 of 98 slices shown]
[im 1/98]
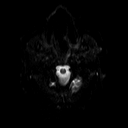
[im 13/98]
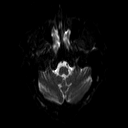
[im 25/98]
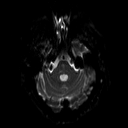
[im 37/98]
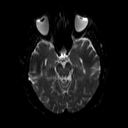
[im 49/98]
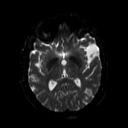
[im 61/98]
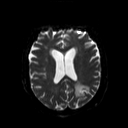
[im 73/98]
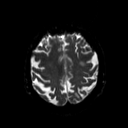
[im 85/98]
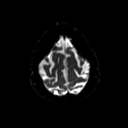
[im 98/98]
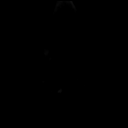

[Series 5: ep2d_diff__adc · axial · 3.0mm · 1.80mm/px · z∈[-16,+127]mm · 5 of 49 slices shown]
[im 1/49]
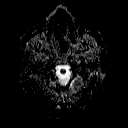
[im 13/49]
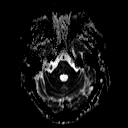
[im 25/49]
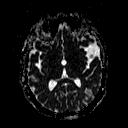
[im 37/49]
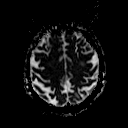
[im 49/49]
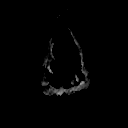

[Series 6: ep2d_diff_cor · coronal · 5.0mm · 1.77mm/px · 5 of 48 slices shown]
[im 1/48]
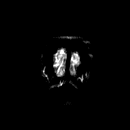
[im 12/48]
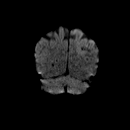
[im 24/48]
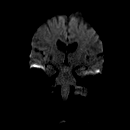
[im 36/48]
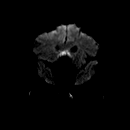
[im 48/48]
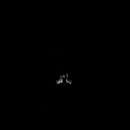

[Series 7: ep2d_diff_cor_adc · coronal · 5.0mm · 1.77mm/px · 2 of 25 slices shown]
[im 1/25]
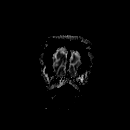
[im 25/25]
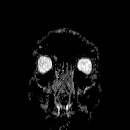

[Series 9: swi_images · axial · 4.0mm · 0.90mm/px · z∈[-14,+124]mm · 3 of 36 slices shown]
[im 1/36]
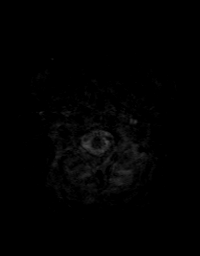
[im 18/36]
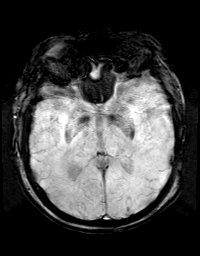
[im 36/36]
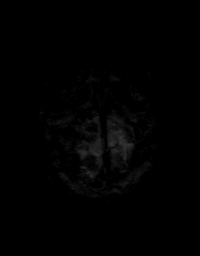

[Series 10: FLAIR · axial · 3.0mm · 0.43mm/px · z∈[-23,+132]mm · 3 of 27 slices shown]
[im 1/27]
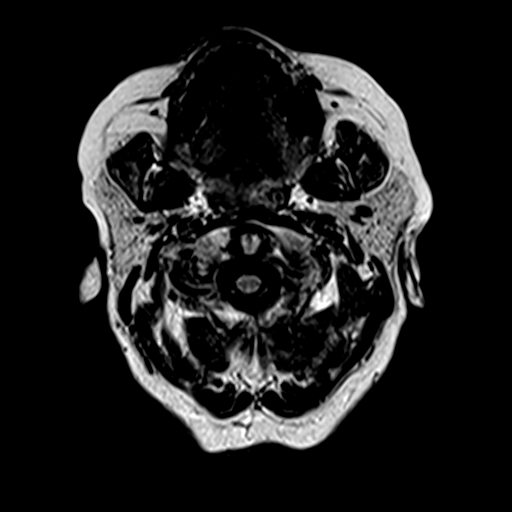
[im 14/27]
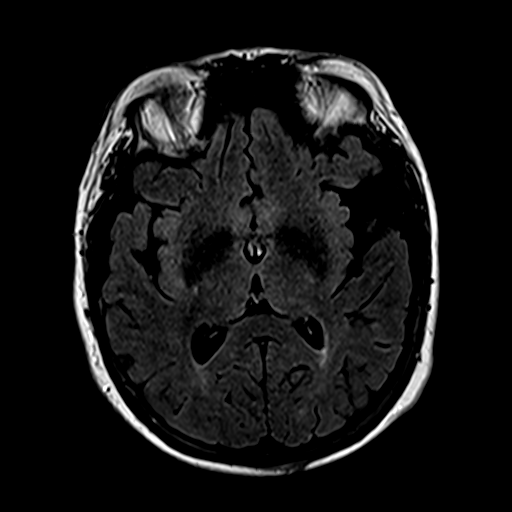
[im 27/27]
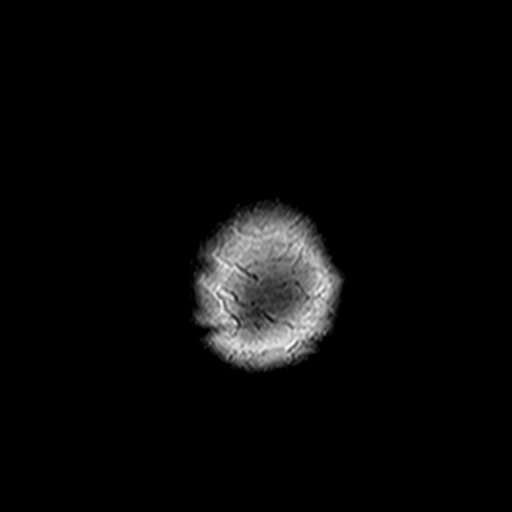

[Series 11: t1_mpr_tra · axial · 1.0mm · 0.72mm/px · z∈[-16,+126]mm · 14 of 144 slices shown]
[im 1/144]
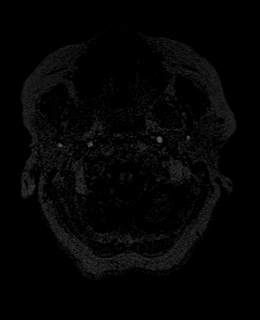
[im 12/144]
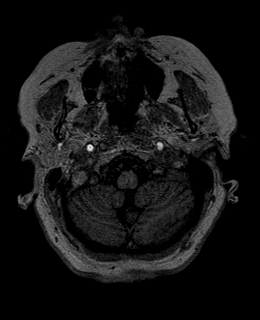
[im 23/144]
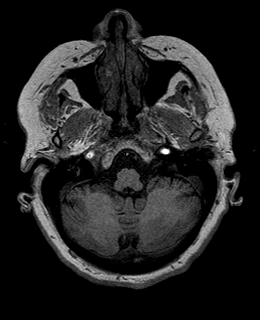
[im 34/144]
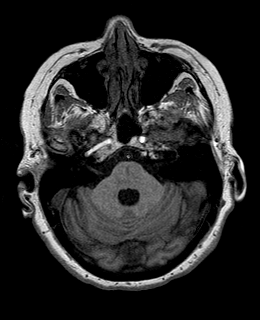
[im 45/144]
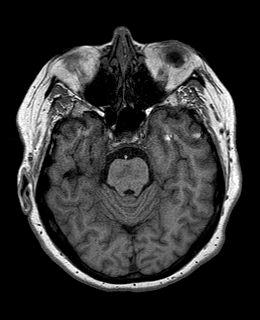
[im 56/144]
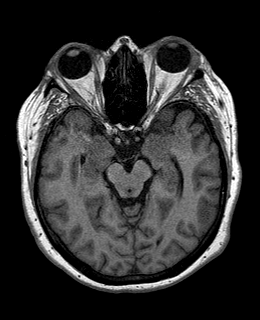
[im 67/144]
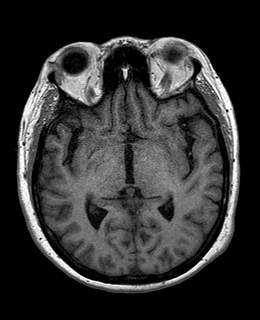
[im 78/144]
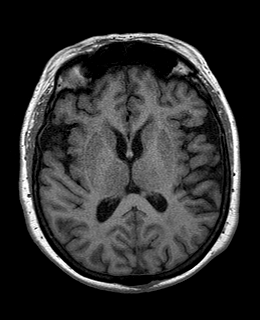
[im 89/144]
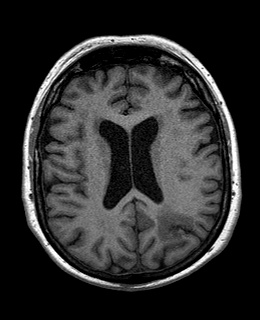
[im 100/144]
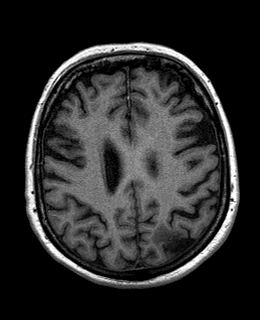
[im 111/144]
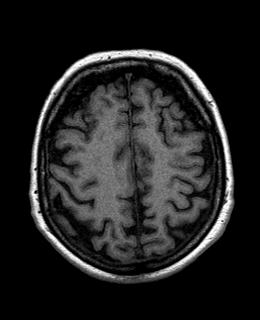
[im 122/144]
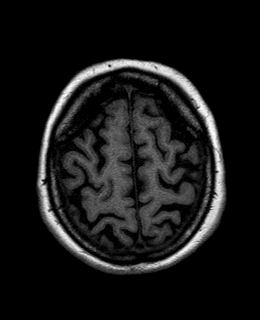
[im 133/144]
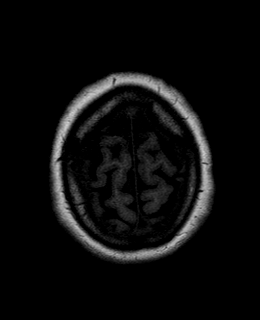
[im 144/144]
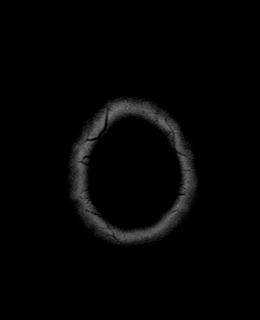

[Series 12: T2 · coronal · 5.0mm · 0.45mm/px · 3 of 26 slices shown]
[im 1/26]
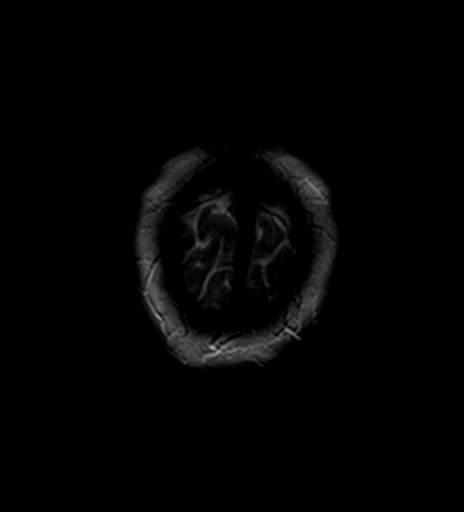
[im 13/26]
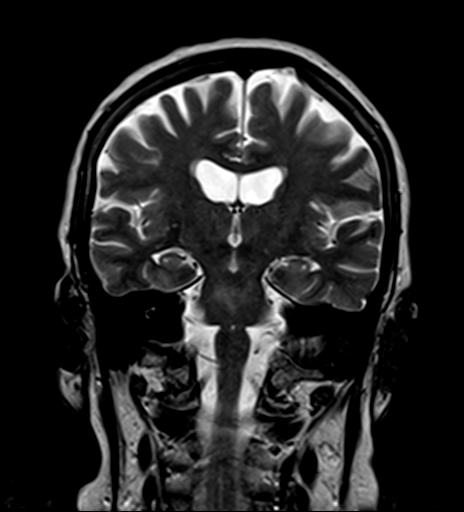
[im 26/26]
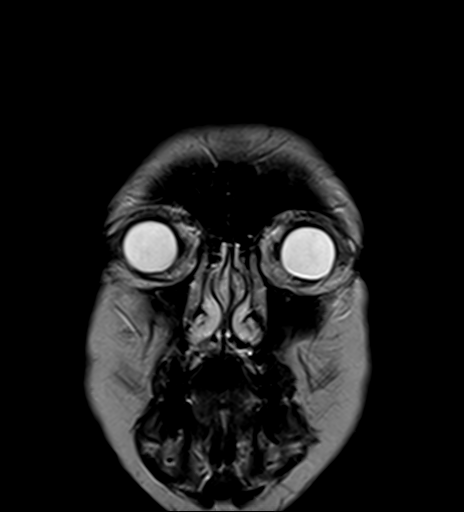

[48 of 48 positions shown; findings below may reference images not displayed]

FINDINGS: Brain: There is no evidence of acute infarct, mass, midline shift,
or extra-axial fluid collection. A small chronic infarct is again
noted in the left parietal lobe with a small amount of hemosiderin
staining. Additional tiny chronic cortical/subcortical infarcts are
noted in both frontal lobes. T2 hyperintensities elsewhere in the
cerebral white matter bilaterally and in the pons are nonspecific
but compatible with mildly age advanced chronic small vessel
ischemic disease. There is mild generalized cerebral atrophy.

Vascular: Major intracranial vascular flow voids are preserved.

Skull and upper cervical spine: Unremarkable bone marrow signal.

Sinuses/Orbits: Unremarkable orbits. Paranasal sinuses and mastoid
air cells are clear.

Other: None.
IMPRESSION: 1. No acute intracranial abnormality.
2. Chronic ischemia with multiple old infarcts as above.

## 2020-07-15 NOTE — Progress Notes (Signed)
Subjective: 70 year old female presents the office today for follow-up evaluation of an ulcer on the left foot.  She has noticed some mild bloody drainage but denies any pus.  She has no new concerns today.  She is continue with daily dressing changes.  She does try to walk differently to avoid pressure.  Denies any fevers, chills, nausea, vomiting.  No calf pain, chest pain, shortness of breath.   Objective: AAO x3, NAD Left foot submetatarsal 1 there is a hyperkeratotic lesion.  The wound was healed however upon debridement still a small superficial skin fissure is present without any probing, undermining or tunneling.  There is no surrounding erythema, ascending cellulitis.  There is no fluctuation or crepitation.  There is no malodor. Severe bunion, hammertoes are noted. No pain with calf compression, swelling, warmth, erythema  Assessment: 70 year old female left foot ulceration-improving  Plan: -All treatment options discussed with the patient including all alternatives, risks, complications.  -I sharply debrided the hyperkeratotic tissue as well as the wound to debride nonviable devitalized tissue to down to healthy, granular tissue to promote wound healing utilizing a #312 with scalpel to any complications.  She tolerated well.  Continue with daily dressing changes.  Offloading at all times. -Monitor for any clinical signs or symptoms of infection and directed to call the office immediately should any occur or go to the ER.  RTC 3 weeks or sooner if needed  Trula Slade DPM

## 2020-07-20 DIAGNOSIS — E785 Hyperlipidemia, unspecified: Secondary | ICD-10-CM | POA: Diagnosis not present

## 2020-07-20 DIAGNOSIS — E1142 Type 2 diabetes mellitus with diabetic polyneuropathy: Secondary | ICD-10-CM | POA: Diagnosis not present

## 2020-07-20 DIAGNOSIS — E11319 Type 2 diabetes mellitus with unspecified diabetic retinopathy without macular edema: Secondary | ICD-10-CM | POA: Diagnosis not present

## 2020-07-20 DIAGNOSIS — I129 Hypertensive chronic kidney disease with stage 1 through stage 4 chronic kidney disease, or unspecified chronic kidney disease: Secondary | ICD-10-CM | POA: Diagnosis not present

## 2020-07-20 DIAGNOSIS — E1140 Type 2 diabetes mellitus with diabetic neuropathy, unspecified: Secondary | ICD-10-CM | POA: Diagnosis not present

## 2020-07-20 DIAGNOSIS — M47812 Spondylosis without myelopathy or radiculopathy, cervical region: Secondary | ICD-10-CM | POA: Diagnosis not present

## 2020-07-20 DIAGNOSIS — F339 Major depressive disorder, recurrent, unspecified: Secondary | ICD-10-CM | POA: Diagnosis not present

## 2020-07-20 DIAGNOSIS — E1121 Type 2 diabetes mellitus with diabetic nephropathy: Secondary | ICD-10-CM | POA: Diagnosis not present

## 2020-07-21 ENCOUNTER — Other Ambulatory Visit: Payer: Self-pay

## 2020-07-21 ENCOUNTER — Ambulatory Visit: Payer: PPO | Admitting: Podiatry

## 2020-07-21 DIAGNOSIS — E1142 Type 2 diabetes mellitus with diabetic polyneuropathy: Secondary | ICD-10-CM | POA: Diagnosis not present

## 2020-07-21 DIAGNOSIS — L97521 Non-pressure chronic ulcer of other part of left foot limited to breakdown of skin: Secondary | ICD-10-CM

## 2020-07-27 NOTE — Progress Notes (Signed)
Subjective: 70 year old female presents the office today for follow-up evaluation of an ulcer on the left foot.  She is going to go to town to [place] for a couple of weeks.  She has no new concerns today foot.  Denies any significant drainage and no increase in swelling or redness.  Denies any fevers, chills, nausea, vomiting.  No calf pain, chest pain, shortness of breath.  She has no other concerns.  Objective: AAO x3, NAD Left foot submetatarsal 1 there is a hyperkeratotic lesion.  Upon debridement there is still a small superficial skin fissure present but appears to be almost completely healed today.  Monitoring this tissue is present.  There is no probing, undermining or tunneling.  There is no surrounding erythema, ascending cellulitis.  No fluctuance or crepitation.  No malodor. Severe bunion, hammertoes are noted. No pain with calf compression, swelling, warmth, erythema  Assessment: 70 year old female left foot ulceration-improving  Plan: -All treatment options discussed with the patient including all alternatives, risks, complications.  -I sharply debrided the hyperkeratotic tissue as well as the wound to debride nonviable devitalized tissue to down to healthy, granular tissue to promote wound healing utilizing a #312 with scalpel to any complications.  She tolerated well.  Continue with daily dressing changes.  Offloading at all times. -Monitor for any clinical signs or symptoms of infection and directed to call the office immediately should any occur or go to the ER.  RTC 3 weeks or sooner if needed  Trula Slade DPM

## 2020-08-13 ENCOUNTER — Other Ambulatory Visit: Payer: Self-pay

## 2020-08-13 ENCOUNTER — Ambulatory Visit: Payer: PPO | Admitting: Podiatry

## 2020-08-13 DIAGNOSIS — L97521 Non-pressure chronic ulcer of other part of left foot limited to breakdown of skin: Secondary | ICD-10-CM

## 2020-08-16 NOTE — Progress Notes (Signed)
Subjective: 70 year old female presents the office today for follow-up evaluation of an ulcer on the left foot.  Since I last saw her she has been in [place] and she has been on her feet quite a bit which she states is causing the wound to heal like she would like for her to.  Denies any swelling or redness or any drainage. Denies any fevers, chills, nausea, vomiting.  No calf pain, chest pain, shortness of breath.  She has no other concerns.  Objective: AAO x3, NAD Left foot submetatarsal 1 there is a hyperkeratotic lesion.  Upon debridement still superficial skin fissure present but appears to be still open.  Measures approximate 0.5 cm in length and superficial any probing to bone, undermining or tunneling.  There is no surrounding erythema, ascending cellulitis.  There is no fluctuation or crepitation peer there is no malodor. Severe bunion, hammertoes are noted. No pain with calf compression, swelling, warmth, erythema  Assessment: 70 year old female left foot ulceration-improving  Plan: -All treatment options discussed with the patient including all alternatives, risks, complications.  -I sharply debrided the hyperkeratotic tissue as well as the wound to debride nonviable devitalized tissue to down to healthy, granular tissue to promote wound healing utilizing a #312 with scalpel to any complications.  She tolerated well.  Continue with daily dressing changes.  Offloading at all times.  Discussed return to the office that is much as possible as I do think that the pressure and not she is on her feet is causing the delayed healing. -Monitor for any clinical signs or symptoms of infection and directed to call the office immediately should any occur or go to the ER.  RTC 3 weeks or sooner if needed  Trula Slade DPM

## 2020-08-18 DIAGNOSIS — E1165 Type 2 diabetes mellitus with hyperglycemia: Secondary | ICD-10-CM | POA: Diagnosis not present

## 2020-08-18 DIAGNOSIS — E1142 Type 2 diabetes mellitus with diabetic polyneuropathy: Secondary | ICD-10-CM | POA: Diagnosis not present

## 2020-08-18 DIAGNOSIS — Z89411 Acquired absence of right great toe: Secondary | ICD-10-CM | POA: Diagnosis not present

## 2020-08-18 DIAGNOSIS — L97509 Non-pressure chronic ulcer of other part of unspecified foot with unspecified severity: Secondary | ICD-10-CM | POA: Diagnosis not present

## 2020-08-18 DIAGNOSIS — Z794 Long term (current) use of insulin: Secondary | ICD-10-CM | POA: Diagnosis not present

## 2020-08-26 DIAGNOSIS — N183 Chronic kidney disease, stage 3 unspecified: Secondary | ICD-10-CM | POA: Diagnosis not present

## 2020-08-26 DIAGNOSIS — E559 Vitamin D deficiency, unspecified: Secondary | ICD-10-CM | POA: Diagnosis not present

## 2020-08-26 DIAGNOSIS — I129 Hypertensive chronic kidney disease with stage 1 through stage 4 chronic kidney disease, or unspecified chronic kidney disease: Secondary | ICD-10-CM | POA: Diagnosis not present

## 2020-08-26 DIAGNOSIS — C50912 Malignant neoplasm of unspecified site of left female breast: Secondary | ICD-10-CM | POA: Diagnosis not present

## 2020-08-26 DIAGNOSIS — F339 Major depressive disorder, recurrent, unspecified: Secondary | ICD-10-CM | POA: Diagnosis not present

## 2020-08-28 ENCOUNTER — Inpatient Hospital Stay: Payer: PPO | Admitting: Adult Health

## 2020-08-28 NOTE — Progress Notes (Deleted)
SURVIVORSHIP VISIT:   BRIEF ONCOLOGIC HISTORY:  Oncology History  Ductal carcinoma in situ (DCIS) of left breast  01/02/2020 Initial Diagnosis   Screening mammogram showed two groups of calcifications in the inferior medial quadrant of the left breast, 0.5cm and 0.9cm. Biopsy showed DCIS, grade 3, ER/PR negative.    01/08/2020 Cancer Staging   Staging form: Breast, AJCC 8th Edition - Clinical stage from 01/08/2020: Stage 0 (cTis (DCIS), cN0, cM0, ER-, PR-, HER2: Not Assessed)   02/25/2020 Surgery   Left lumpectomy Ninfa Linden) [id number]): high grade DCIS, 1.4cm, clear margins. No regional lymph nodes were examined.   03/31/2020 - 04/29/2020 Radiation Therapy   The patient initially received a dose of 40.05 Gy in 15 fractions to the breast using whole-breast tangent fields. This was delivered using a 3-D conformal technique. The pt received a boost delivering an additional 10 Gy in 5 fractions using a electron boost with 35mV electrons. The total dose was 50.05 Gy.   04/2020 - 04/2025 Anti-estrogen oral therapy   Tamoxifen     INTERVAL HISTORY:  [name] review her survivorship care plan detailing her treatment course for breast cancer, as well as monitoring long-term side effects of that treatment, education regarding health maintenance, screening, and overall wellness and health promotion.     Overall, Ms. GSymmondsreports feeling quite well   REVIEW OF SYSTEMS:  Review of Systems  Constitutional:  Negative for appetite change, chills, fatigue, fever and unexpected weight change.  HENT:   Negative for hearing loss, lump/mass and trouble swallowing.   Eyes:  Negative for eye problems and icterus.  Respiratory:  Negative for chest tightness, cough and shortness of breath.   Cardiovascular:  Negative for chest pain, leg swelling and palpitations.  Gastrointestinal:  Negative for abdominal distention, abdominal pain, constipation, diarrhea, nausea and vomiting.  Endocrine: Negative  for hot flashes.  Genitourinary:  Negative for difficulty urinating.   Musculoskeletal:  Negative for arthralgias.  Skin:  Negative for itching and rash.  Neurological:  Negative for dizziness, extremity weakness, headaches and numbness.  Hematological:  Negative for adenopathy. Does not bruise/bleed easily.  Psychiatric/Behavioral:  Negative for depression. The patient is not nervous/anxious.   Breast: Denies any new nodularity, masses, tenderness, nipple changes, or nipple discharge.      ONCOLOGY TREATMENT TEAM:  1. [facility] 2. Medical Oncologist: Dr. GLindi Adie 3. Radiation Oncologist: Dr. SIsidore Moos   PAST MEDICAL/SURGICAL HISTORY:  Past Medical History:  Diagnosis Date   Breast cancer ([place] 12/2019   left breast DCIS   CKD (chronic kidney disease) 06/07/2016   Stage 2, GFR 60-89 ml/min   Diabetic polyneuropathy 06/07/2016   Dyslipidemia    Endometrial adenocarcinoma 2012   Fever blister    Hyperlipidemia    Hypertension, essential 06/07/2016   Major depressive disorder    Mixed dyslipidemia 06/07/2016   MRSA infection 10/2019   left foot wound   Obstructive sleep apnea 06/07/2016   does not use CPAP   Peripheral neuropathy    Retinopathy    Severe obesity    Skin ulcer of left foot with fat layer exposed 11/02/2016   Stroke    Asymptomatic, discovered via neuroimaging; small infarct in left parietal lobe; also concern for small b/l frontal infarcts   Tachycardia 07/19/2017   Type 2 diabetes mellitus with hyperglycemia, with long-term current use of insulin 06/07/2016   Unilateral primary osteoarthritis, right knee 04/16/2019   Past Surgical History:  Procedure Laterality  Date   ADRENALECTOMY     BREAST LUMPECTOMY WITH RADIOACTIVE SEED LOCALIZATION Left 02/25/2020   Procedure: LEFT BREAST LUMPECTOMY WITH RADIOACTIVE SEED LOCALIZATION;  Surgeon: Coralie Keens, MD;  Location: [place];  Service: General;  Laterality:  Left;   BUNIONECTOMY     CARDIAC CATHETERIZATION     CERVICAL SPINE SURGERY     CHOLECYSTECTOMY     DILATION AND CURETTAGE OF UTERUS     LAPAROSCOPIC SALPINGOOPHERECTOMY     THORACOTOMY     TONSILLECTOMY       ALLERGIES:  Allergies  Allergen Reactions   Amitriptyline Other (See Comments)   Ciprofloxacin Other (See Comments)   Penicillins Rash   Sulfa Antibiotics Rash   Sulfamethoxazole Rash     CURRENT MEDICATIONS:  Outpatient Encounter Medications as of 08/28/2020  Medication Sig   aspirin EC 81 MG tablet Take 81 mg by mouth daily. Swallow whole.   buPROPion (WELLBUTRIN XL) 300 MG 24 hr tablet Take 300 mg by mouth daily.   carvedilol (COREG) 12.5 MG tablet Take 12.5 mg by mouth 2 (two) times daily with a meal.   FLUoxetine (PROZAC) 40 MG capsule Take 40 mg by mouth daily.    furosemide (LASIX) 40 MG tablet Take 40 mg by mouth daily as needed.   insulin NPH-regular Human (NOVOLIN 70/30) (70-30) 100 UNIT/ML injection Inject into the skin. 24 ml in the morning and 20 ml in the evening   Lancets (ONETOUCH DELICA PLUS [id number]) MISC USE AS DIRECTED 3 TIMES A DAY   losartan (COZAAR) 100 MG tablet Take 100 mg by mouth daily.    metFORMIN (GLUCOPHAGE-XR) 500 MG 24 hr tablet Take 1,000 mg by mouth in the morning and at bedtime. PT TAKES 2000 MG DAILY   MONOJECT INSULIN SYRINGE 31G X 5/16" 1 ML MISC See admin instructions.   mupirocin ointment (BACTROBAN) 2 % Apply 1 application topically daily.   naproxen (NAPROSYN) 250 MG tablet Take 1 tablet by mouth in the morning and at bedtime.   nystatin cream (MYCOSTATIN) 1 APPLICATION TWICE A DAY EXTERNALLY   ONETOUCH VERIO test strip    pravastatin (PRAVACHOL) 40 MG tablet Take 1 tablet once a day for cholesterol   tamoxifen (NOLVADEX) 20 MG tablet Take 1 tablet (20 mg total) by mouth daily.   traMADol (ULTRAM) 50 MG tablet Take 1 tablet (50 mg total) by mouth every 6 (six) hours as needed for moderate pain or severe pain.   No  facility-administered encounter medications on file as of 08/28/2020.     ONCOLOGIC FAMILY HISTORY:  Family History  Problem Relation Age of Onset   Hypertension Mother    Alzheimer's disease Mother    Heart attack Father    CAD Father    Alzheimer's disease Father    Bladder Cancer Father        dx late 84s   Diverticulitis Sister    Obesity Sister    Hypertension Sister    Voice disorder Brother    Breast cancer Paternal Aunt 31   Prostate cancer Paternal Uncle        dx mid 97s     GENETIC COUNSELING/TESTING: Not at this time  SOCIAL HISTORY:  Social History   Socioeconomic History   Marital status: Single    Spouse name: Not on file   Number of children: Not on file   Years of education: 16   Highest education level: Bachelor's degree (e.g., BA, AB, BS)  Occupational History  Occupation: Retired    Comment: Pharmacist, hospital  Tobacco Use   Smoking status: Never   Smokeless tobacco: Never  Vaping Use   Vaping Use: Never used  Substance and Sexual Activity   Alcohol use: No   Drug use: No   Sexual activity: Not Currently    Birth control/protection: Surgical  Other Topics Concern   Not on file  Social History Narrative   Right Handed   Lives in one story home   Does not drink caffeine   Social Determinants of Health   Financial Resource Strain: Not on file  Food Insecurity: Not on file  Transportation Needs: Not on file  Physical Activity: Not on file  Stress: Not on file  Social Connections: Not on file  Intimate Partner Violence: Not on file     OBSERVATIONS/OBJECTIVE:  There were no vitals taken for this visit. GENERAL: Patient is a well appearing female in no acute distress HEENT:  Sclerae anicteric.  Oropharynx clear and moist. No ulcerations or evidence of oropharyngeal candidiasis. Neck is supple.  NODES:  No cervical, supraclavicular, or axillary lymphadenopathy palpated.  BREAST EXAM:  left breast s/p lumpectomy and radiation ,no sign of local  recurrence right breast benign LUNGS:  Clear to auscultation bilaterally.  No wheezes or rhonchi. HEART:  Regular rate and rhythm. No murmur appreciated. ABDOMEN:  Soft, nontender.  Positive, normoactive bowel sounds. No organomegaly palpated. MSK:  No focal spinal tenderness to palpation. Full range of motion bilaterally in the upper extremities. EXTREMITIES:  No peripheral edema.   SKIN:  Clear with no obvious rashes or skin changes. No nail dyscrasia. NEURO:  Nonfocal. Well oriented.  Appropriate affect.   LABORATORY DATA:  None for this visit.  DIAGNOSTIC IMAGING:  None for this visit.      ASSESSMENT AND PLAN:  Ms.. [name] is a pleasant 69 y.o. female with Stage 0 left breast DCIS, ER+/PR+, diagnosed in 12/2019, treated with lumpectomy, adjuvant radiation therapy, and anti-estrogen therapy with Tamoxifen beginning in 04/2020.  She presents to the Survivorship Clinic for our initial meeting and routine follow-up post-completion of treatment for breast cancer.    1. Stage 0 left breast cancer:  Ms. Coffel is continuing to recover from definitive treatment for breast cancer. She will follow-up with her medical oncologist, Dr. Lindi Adie in *** with history and physical exam per surveillance protocol.  She will continue her anti-estrogen therapy with ***. Thus far, she is tolerating the *** well, with minimal side effects. She was instructed to make Dr. Lindi Adie or myself aware if she begins to experience any worsening side effects of the medication and I could see her back in clinic to help manage those side effects, as needed. Her mammogram is due ***; orders placed today.  Her breast density is category ***. Today, a comprehensive survivorship care plan and treatment summary was reviewed with the patient today detailing her breast cancer diagnosis, treatment course, potential late/long-term effects of treatment, appropriate follow-up care with recommendations for the future, and patient education  resources.  A copy of this summary, along with a letter will be sent to the patient's primary care provider via mail/fax/In Basket message after today's visit.    #. Problem(s) at Visit______________  #. Bone health:  Given Ms. Dayley age/history of breast cancer and her current treatment regimen including anti-estrogen therapy with ***, she is at risk for bone demineralization.  Her last DEXA scan was ***, which showed ***.  In the meantime, she was encouraged to increase  her consumption of foods rich in calcium, as well as increase her weight-bearing activities.  She was given education on specific activities to promote bone health.  #. Cancer screening:  Due to Ms. Pain history and her age, she should receive screening for skin cancers, colon cancer, and gynecologic cancers.  The information and recommendations are listed on the patient's comprehensive care plan/treatment summary and were reviewed in detail with the patient.    #. Health maintenance and wellness promotion: Ms. Grussing was encouraged to consume 5-7 servings of fruits and vegetables per day. We reviewed the "Nutrition Rainbow" handout, as well as the handout "Take Control of Your Health and Reduce Your Cancer Risk" from the [employer].  She was also encouraged to engage in moderate to vigorous exercise for 30 minutes per day most days of the week. We discussed the [employer] fitness program, which is designed for cancer survivors to help them become more physically fit after cancer treatments.  She was instructed to limit her alcohol consumption and continue to abstain from tobacco use/***was encouraged stop smoking.     #. Support services/counseling: It is not uncommon for this period of the patient's cancer care trajectory to be one of many emotions and stressors.  We discussed how this can be increasingly difficult during the times of quarantine and social distancing due to the COVID-19 pandemic.   She was given  information regarding our available services and encouraged to contact me with any questions or for help enrolling in any of our support group/programs.    Follow up instructions:    -Return to cancer center ***  -Mammogram due in *** -Follow up with surgery *** -She is welcome to return back to the Survivorship Clinic at any time; no additional follow-up needed at this time.  -Consider referral back to survivorship as a long-term survivor for continued surveillance  The patient was provided an opportunity to ask questions and all were answered. The patient agreed with the plan and demonstrated an understanding of the instructions.   Total encounter time: *** minutes  Wilber Bihari, NP 08/28/20 1:13 PM Medical Oncology and Hematology [facility] [place], [place] [address] Tel. [phone]    Fax. [phone]  *Total Encounter Time as defined by the Centers for Medicare and Medicaid Services includes, in addition to the face-to-face time of a patient visit (documented in the note above) non-face-to-face time: obtaining and reviewing outside history, ordering and reviewing medications, tests or procedures, care coordination (communications with other health care professionals or caregivers) and documentation in the medical record.

## 2020-09-07 ENCOUNTER — Ambulatory Visit (INDEPENDENT_AMBULATORY_CARE_PROVIDER_SITE_OTHER): Payer: PPO

## 2020-09-07 ENCOUNTER — Ambulatory Visit: Payer: PPO | Admitting: Podiatry

## 2020-09-07 ENCOUNTER — Other Ambulatory Visit: Payer: Self-pay

## 2020-09-07 VITALS — Temp 99.2°F

## 2020-09-07 DIAGNOSIS — L97522 Non-pressure chronic ulcer of other part of left foot with fat layer exposed: Secondary | ICD-10-CM | POA: Diagnosis not present

## 2020-09-07 DIAGNOSIS — L97521 Non-pressure chronic ulcer of other part of left foot limited to breakdown of skin: Secondary | ICD-10-CM | POA: Diagnosis not present

## 2020-09-07 DIAGNOSIS — L03116 Cellulitis of left lower limb: Secondary | ICD-10-CM | POA: Diagnosis not present

## 2020-09-07 DIAGNOSIS — E785 Hyperlipidemia, unspecified: Secondary | ICD-10-CM | POA: Diagnosis not present

## 2020-09-07 DIAGNOSIS — N183 Chronic kidney disease, stage 3 unspecified: Secondary | ICD-10-CM | POA: Diagnosis not present

## 2020-09-07 DIAGNOSIS — F339 Major depressive disorder, recurrent, unspecified: Secondary | ICD-10-CM | POA: Diagnosis not present

## 2020-09-07 DIAGNOSIS — I129 Hypertensive chronic kidney disease with stage 1 through stage 4 chronic kidney disease, or unspecified chronic kidney disease: Secondary | ICD-10-CM | POA: Diagnosis not present

## 2020-09-07 DIAGNOSIS — E11319 Type 2 diabetes mellitus with unspecified diabetic retinopathy without macular edema: Secondary | ICD-10-CM | POA: Diagnosis not present

## 2020-09-07 DIAGNOSIS — E1121 Type 2 diabetes mellitus with diabetic nephropathy: Secondary | ICD-10-CM | POA: Diagnosis not present

## 2020-09-07 DIAGNOSIS — E1142 Type 2 diabetes mellitus with diabetic polyneuropathy: Secondary | ICD-10-CM | POA: Diagnosis not present

## 2020-09-07 DIAGNOSIS — E1140 Type 2 diabetes mellitus with diabetic neuropathy, unspecified: Secondary | ICD-10-CM | POA: Diagnosis not present

## 2020-09-07 MED ORDER — DOXYCYCLINE HYCLATE 100 MG PO TABS: 100.0000 mg | ORAL_TABLET | Freq: Two times a day (BID) | ORAL | 1 refills | Status: DC

## 2020-09-11 NOTE — Progress Notes (Signed)
Subjective: 70 year old female presents the office today for same-day appointment given worsening of her wound of the left foot.  She said that she noticed it getting worse over the weekend.  She says increased bloody drainage and a slight odor but no pus is coming more blood as far as drainage.  She denies any fevers or chills.  No nausea or vomiting.  Objective: AAO x3, NAD Left foot submetatarsal 1 there is a hyperkeratotic lesion.  Upon debridement the wound has increased in size and measured about 1 x 1 cm.  There is no drainage or pus identified there is no fluctuation crepitation but there is macerated tissue present on the periwound.  There is mild surrounding erythema but there is no ascending cellulitis.  No pain with calf compression, swelling, warmth, erythema  Assessment: 70 year old female left foot ulceration, localized cellulitis  Plan: -All treatment options discussed with the patient including all alternatives, risks, complications.  -X-rays obtained reviewed.  Multiple deformities and bunions present.  No definitive evidence of acute osteomyelitis identified today. -I sharply debrided the hyperkeratotic tissue as well as the wound to debride nonviable devitalized tissue to down to healthy, granular tissue to promote wound healing utilizing a #312 with scalpel to any complications.  She tolerated well.  No blood loss.  Small amount of Betadine was applied followed by dressing.  Continue daily dressing changes. -Prescribe doxycycline.  She does have a small temperature and mild erythema.  Appears to be localized.  Discussion was to keep a very close eye on her temperature as well as any worsening of the cellulitis and if this were to occur she is to go to the emergency department. -Monitor for any clinical signs or symptoms of infection and directed to call the office immediately should any occur or go to the ER.  Return in about 1 week (around 09/14/2020).  Trula Slade  DPM

## 2020-10-05 ENCOUNTER — Ambulatory Visit: Payer: PPO | Admitting: Podiatry

## 2020-10-05 ENCOUNTER — Other Ambulatory Visit: Payer: Self-pay

## 2020-10-05 ENCOUNTER — Encounter: Payer: Self-pay | Admitting: Podiatry

## 2020-10-05 DIAGNOSIS — L02619 Cutaneous abscess of unspecified foot: Secondary | ICD-10-CM

## 2020-10-05 DIAGNOSIS — M778 Other enthesopathies, not elsewhere classified: Secondary | ICD-10-CM

## 2020-10-05 DIAGNOSIS — L03119 Cellulitis of unspecified part of limb: Secondary | ICD-10-CM

## 2020-10-05 DIAGNOSIS — L97522 Non-pressure chronic ulcer of other part of left foot with fat layer exposed: Secondary | ICD-10-CM | POA: Diagnosis not present

## 2020-10-12 NOTE — Progress Notes (Signed)
Subjective: 70 year old female presents the office today for same-day appointment given worsening of her wound of the left foot.  She does not have to go to town to [place] and sure to have the foot checked before treatment.  She is noticed increased callus formation.  No increase in swelling or redness.  She also states is difficult for her to see the wound.  No pus that she reports.  Occasional small mount of bloody drainage.  Denies any fevers or chills.  Objective: AAO x3, NAD Left foot submetatarsal 1 there is a hyperkeratotic lesion which appears to be more today.  After debridement the wound measures 1 x 0.5 x 0.2 cm.  There is no surrounding erythema, ascending cellulitis.  No fluctuation crepitation.  No malodor No pain with calf compression, swelling, warmth, erythema  Assessment: 70 year old female left foot ulceration, improved erythema  Plan: -All treatment options discussed with the patient including all alternatives, risks, complications.  -I sharply debrided the hyperkeratotic tissue as well as the wound to debride nonviable devitalized tissue to down to healthy, granular tissue to promote wound healing utilizing a #312 with scalpel to any complications.  She tolerated well.  No blood loss.  Dressing applied including Medihoney followed by dry sterile dressing -Monitor for any clinical signs or symptoms of infection and directed to call the office immediately should any occur or go to the ER.  Return in about 2 weeks (around 10/19/2020).  Trula Slade DPM

## 2020-11-18 DIAGNOSIS — M47812 Spondylosis without myelopathy or radiculopathy, cervical region: Secondary | ICD-10-CM | POA: Diagnosis not present

## 2020-11-18 DIAGNOSIS — E1140 Type 2 diabetes mellitus with diabetic neuropathy, unspecified: Secondary | ICD-10-CM | POA: Diagnosis not present

## 2020-11-18 DIAGNOSIS — E1142 Type 2 diabetes mellitus with diabetic polyneuropathy: Secondary | ICD-10-CM | POA: Diagnosis not present

## 2020-11-18 DIAGNOSIS — F339 Major depressive disorder, recurrent, unspecified: Secondary | ICD-10-CM | POA: Diagnosis not present

## 2020-11-18 DIAGNOSIS — E785 Hyperlipidemia, unspecified: Secondary | ICD-10-CM | POA: Diagnosis not present

## 2020-11-18 DIAGNOSIS — E1121 Type 2 diabetes mellitus with diabetic nephropathy: Secondary | ICD-10-CM | POA: Diagnosis not present

## 2020-11-18 DIAGNOSIS — N183 Chronic kidney disease, stage 3 unspecified: Secondary | ICD-10-CM | POA: Diagnosis not present

## 2020-11-18 DIAGNOSIS — I129 Hypertensive chronic kidney disease with stage 1 through stage 4 chronic kidney disease, or unspecified chronic kidney disease: Secondary | ICD-10-CM | POA: Diagnosis not present

## 2020-11-18 DIAGNOSIS — C50912 Malignant neoplasm of unspecified site of left female breast: Secondary | ICD-10-CM | POA: Diagnosis not present

## 2020-11-18 DIAGNOSIS — F3341 Major depressive disorder, recurrent, in partial remission: Secondary | ICD-10-CM | POA: Diagnosis not present

## 2020-11-18 DIAGNOSIS — E11319 Type 2 diabetes mellitus with unspecified diabetic retinopathy without macular edema: Secondary | ICD-10-CM | POA: Diagnosis not present

## 2020-11-24 ENCOUNTER — Other Ambulatory Visit: Payer: Self-pay

## 2020-11-24 ENCOUNTER — Ambulatory Visit: Payer: PPO | Admitting: Podiatry

## 2020-11-24 DIAGNOSIS — L02619 Cutaneous abscess of unspecified foot: Secondary | ICD-10-CM | POA: Diagnosis not present

## 2020-11-24 DIAGNOSIS — L97522 Non-pressure chronic ulcer of other part of left foot with fat layer exposed: Secondary | ICD-10-CM

## 2020-11-24 DIAGNOSIS — L03119 Cellulitis of unspecified part of limb: Secondary | ICD-10-CM | POA: Diagnosis not present

## 2020-11-24 MED ORDER — DOXYCYCLINE HYCLATE 100 MG PO TABS
100.0000 mg | ORAL_TABLET | Freq: Two times a day (BID) | ORAL | 1 refills | Status: DC
Start: 2020-11-24 — End: 2021-02-15

## 2020-11-28 NOTE — Progress Notes (Signed)
Subjective: 70 year old female presents the office today for worsening wound on the left foot.  She said that she tried pills and the skin off her self.  She states that she is a lot going on and became tearful at today's appointment.  She denies any fevers or chills.  Objective: AAO x3, NAD Left foot submetatarsal 1 there is a hyperkeratotic lesion which appears to be more today.  After debridement the wound measures 1.5 x 1 x 0.2 cm.  There is no surrounding erythema, ascending cellulitis.  No fluctuation crepitation.  No malodor Significant bunion is present. No pain with calf compression, swelling, warmth, erythema  Assessment: 70 year old female left foot ulceration, improved erythema  Plan: -All treatment options discussed with the patient including all alternatives, risks, complications.  -I sharply debrided the hyperkeratotic tissue as well as the wound to debride nonviable devitalized tissue to down to healthy, granular tissue to promote wound healing utilizing a #312 with scalpel to any complications.  She tolerated well.  No blood loss.  Continue daily dressing changes.  Recommended to follow-up with wound care center as well.  She is [facility] an appointment and she is known to them as well.  Since the wound is gotten larger also prescribe doxycycline.  There is no drainage to culture. -As the wound has gotten larger and is not healing we will order arterial studies as well. -Monitor for any clinical signs or symptoms of infection and directed to call the office immediately should any occur or go to the ER.  *X-ray next appointment  Trula Slade DPM

## 2020-11-30 ENCOUNTER — Telehealth: Payer: Self-pay | Admitting: *Deleted

## 2020-11-30 NOTE — Telephone Encounter (Signed)
-----   Message from Trula Slade, DPM sent at 11/28/2020  7:25 AM EDT ----- Can you please call and let her know that I ended up ordering arterial studies to check her circulation.  I wanted to make sure it is good as the wound is still not healing.

## 2020-11-30 NOTE — Telephone Encounter (Signed)
Called and spoke with the patient and relayed the message per Dr Jacqualyn Posey and the patient stated that she got the call from [facility] and has an appointment set up. [name]

## 2020-12-03 ENCOUNTER — Ambulatory Visit (HOSPITAL_COMMUNITY)
Admission: RE | Admit: 2020-12-03 | Discharge: 2020-12-03 | Disposition: A | Discharge status: Home / Self Care | Payer: PPO | Source: Ambulatory Visit | Attending: Podiatry | Admitting: Podiatry

## 2020-12-03 ENCOUNTER — Other Ambulatory Visit: Payer: Self-pay

## 2020-12-03 DIAGNOSIS — L97522 Non-pressure chronic ulcer of other part of left foot with fat layer exposed: Secondary | ICD-10-CM | POA: Diagnosis not present

## 2020-12-04 ENCOUNTER — Telehealth: Payer: Self-pay | Admitting: *Deleted

## 2020-12-04 NOTE — Telephone Encounter (Signed)
Called and spoke with the patient and relayed the message per Dr Jacqualyn Posey. Lattie Haw

## 2020-12-04 NOTE — Telephone Encounter (Signed)
-----   Message from Trula Slade, DPM sent at 12/03/2020  5:22 PM EDT ----- Lattie Haw- please let her know that the circulation test is normal.

## 2020-12-07 DIAGNOSIS — I129 Hypertensive chronic kidney disease with stage 1 through stage 4 chronic kidney disease, or unspecified chronic kidney disease: Secondary | ICD-10-CM | POA: Diagnosis not present

## 2020-12-07 DIAGNOSIS — E785 Hyperlipidemia, unspecified: Secondary | ICD-10-CM | POA: Diagnosis not present

## 2020-12-07 DIAGNOSIS — F339 Major depressive disorder, recurrent, unspecified: Secondary | ICD-10-CM | POA: Diagnosis not present

## 2020-12-07 DIAGNOSIS — N183 Chronic kidney disease, stage 3 unspecified: Secondary | ICD-10-CM | POA: Diagnosis not present

## 2020-12-08 ENCOUNTER — Ambulatory Visit (INDEPENDENT_AMBULATORY_CARE_PROVIDER_SITE_OTHER): Payer: PPO

## 2020-12-08 ENCOUNTER — Other Ambulatory Visit: Payer: Self-pay

## 2020-12-08 ENCOUNTER — Ambulatory Visit: Payer: PPO | Admitting: Podiatry

## 2020-12-08 ENCOUNTER — Encounter: Payer: Self-pay | Admitting: Podiatry

## 2020-12-08 DIAGNOSIS — L97522 Non-pressure chronic ulcer of other part of left foot with fat layer exposed: Secondary | ICD-10-CM

## 2020-12-08 DIAGNOSIS — L821 Other seborrheic keratosis: Secondary | ICD-10-CM | POA: Insufficient documentation

## 2020-12-08 DIAGNOSIS — L02619 Cutaneous abscess of unspecified foot: Secondary | ICD-10-CM

## 2020-12-08 DIAGNOSIS — L03119 Cellulitis of unspecified part of limb: Secondary | ICD-10-CM

## 2020-12-08 DIAGNOSIS — D485 Neoplasm of uncertain behavior of skin: Secondary | ICD-10-CM | POA: Insufficient documentation

## 2020-12-15 NOTE — Progress Notes (Signed)
Subjective: 70 year old female presents the office today for follow-up evaluation of a wound on her left foot.  Also also the nails be trimmed today.  She is of the wound is doing okay and having minimal bloody drainage but denies any purulence.  No increase in swelling or redness.  She has no fevers or chills.  She has no other concerns.  Objective: AAO x3, NAD Left foot submetatarsal 1 there is a hyperkeratotic lesion which appears to be more today.  After debridement the wound measures 1 x 1 x 0.2 cm after debridement and appropriate wound measurements are about the same.  There is no surrounding erythema, ascending cellulitis.  No fluctuation crepitation.  No malodor Significant bunion is present. Nails are hypertrophic, dystrophic, brittle, discolored, elongated. No surrounding redness or drainage. Tenderness to the nails.  No open lesions or pre-ulcerative lesions are identified today No pain with calf compression, swelling, warmth, erythema  Assessment: 70 year old female left foot ulceration, improved erythema  Plan: -All treatment options discussed with the patient including all alternatives, risks, complications.  -I sharply debrided the hyperkeratotic tissue as well as the wound to debride nonviable devitalized tissue to down to healthy, granular tissue to promote wound healing utilizing a #312 with scalpel to any complications.  She tolerated well.  No blood loss.  Continue daily dressing changes.  -Sharply debrided the nails without any complications or bleeding. -Monitor for any clinical signs or symptoms of infection and directed to call the office immediately should any occur or go to the ER.  Return in about 2 weeks (around 12/22/2020).  Trula Slade DPM

## 2020-12-17 DIAGNOSIS — I129 Hypertensive chronic kidney disease with stage 1 through stage 4 chronic kidney disease, or unspecified chronic kidney disease: Secondary | ICD-10-CM | POA: Diagnosis not present

## 2020-12-17 DIAGNOSIS — E1142 Type 2 diabetes mellitus with diabetic polyneuropathy: Secondary | ICD-10-CM | POA: Diagnosis not present

## 2020-12-17 DIAGNOSIS — N183 Chronic kidney disease, stage 3 unspecified: Secondary | ICD-10-CM | POA: Diagnosis not present

## 2020-12-17 DIAGNOSIS — E1121 Type 2 diabetes mellitus with diabetic nephropathy: Secondary | ICD-10-CM | POA: Diagnosis not present

## 2020-12-17 DIAGNOSIS — E785 Hyperlipidemia, unspecified: Secondary | ICD-10-CM | POA: Diagnosis not present

## 2020-12-17 DIAGNOSIS — E1140 Type 2 diabetes mellitus with diabetic neuropathy, unspecified: Secondary | ICD-10-CM | POA: Diagnosis not present

## 2020-12-17 DIAGNOSIS — F339 Major depressive disorder, recurrent, unspecified: Secondary | ICD-10-CM | POA: Diagnosis not present

## 2020-12-17 DIAGNOSIS — E11319 Type 2 diabetes mellitus with unspecified diabetic retinopathy without macular edema: Secondary | ICD-10-CM | POA: Diagnosis not present

## 2020-12-24 ENCOUNTER — Ambulatory Visit: Payer: PPO | Admitting: Podiatry

## 2020-12-24 ENCOUNTER — Other Ambulatory Visit: Payer: Self-pay

## 2020-12-24 DIAGNOSIS — L97522 Non-pressure chronic ulcer of other part of left foot with fat layer exposed: Secondary | ICD-10-CM | POA: Diagnosis not present

## 2020-12-24 DIAGNOSIS — E1142 Type 2 diabetes mellitus with diabetic polyneuropathy: Secondary | ICD-10-CM | POA: Diagnosis not present

## 2020-12-24 DIAGNOSIS — L84 Corns and callosities: Secondary | ICD-10-CM | POA: Diagnosis not present

## 2020-12-30 NOTE — Progress Notes (Signed)
Subjective: 70 year old female presents the office today for follow-up evaluation of a wound on her left foot.  She did put the silver collagen on 1 time.  She has not seen any swelling or redness or any drainage.  No pain although she does have significant neuropathy.  No fevers or chills.  No other concerns.   Objective: AAO x3, NAD Left foot submetatarsal 1 is a hyperkeratotic lesion.  Upon debridement appears the wound is healed is preulcerative.  There is no drainage or pus.  No surrounding erythema, ascending cellulitis.  There is no fluctuation crepitation.  No malodor. Significant bunion is present. No pain with calf compression, swelling, warmth, erythema  Assessment: 70 year old female left foot ulceration, much improved  Plan: -All treatment options discussed with the patient including all alternatives, risks, complications.  -Sharp debrided the hyperkeratotic lesion today with a #312 blade scalpel to any complications.  The wound is preulcerative however has made good progress.  Continue with dressing changes and offloading.  Monitor closely for any signs or symptoms of infection.  Return in about 3 weeks (around 01/14/2021).  Trula Slade DPM

## 2020-12-31 DIAGNOSIS — E1140 Type 2 diabetes mellitus with diabetic neuropathy, unspecified: Secondary | ICD-10-CM | POA: Diagnosis not present

## 2020-12-31 DIAGNOSIS — I129 Hypertensive chronic kidney disease with stage 1 through stage 4 chronic kidney disease, or unspecified chronic kidney disease: Secondary | ICD-10-CM | POA: Diagnosis not present

## 2020-12-31 DIAGNOSIS — F339 Major depressive disorder, recurrent, unspecified: Secondary | ICD-10-CM | POA: Diagnosis not present

## 2020-12-31 DIAGNOSIS — N183 Chronic kidney disease, stage 3 unspecified: Secondary | ICD-10-CM | POA: Diagnosis not present

## 2020-12-31 DIAGNOSIS — E11319 Type 2 diabetes mellitus with unspecified diabetic retinopathy without macular edema: Secondary | ICD-10-CM | POA: Diagnosis not present

## 2020-12-31 DIAGNOSIS — E1121 Type 2 diabetes mellitus with diabetic nephropathy: Secondary | ICD-10-CM | POA: Diagnosis not present

## 2020-12-31 DIAGNOSIS — E785 Hyperlipidemia, unspecified: Secondary | ICD-10-CM | POA: Diagnosis not present

## 2020-12-31 DIAGNOSIS — M47812 Spondylosis without myelopathy or radiculopathy, cervical region: Secondary | ICD-10-CM | POA: Diagnosis not present

## 2020-12-31 DIAGNOSIS — E1142 Type 2 diabetes mellitus with diabetic polyneuropathy: Secondary | ICD-10-CM | POA: Diagnosis not present

## 2021-01-14 ENCOUNTER — Ambulatory Visit: Payer: PPO | Admitting: Podiatry

## 2021-01-14 ENCOUNTER — Other Ambulatory Visit: Payer: Self-pay

## 2021-01-14 VITALS — Temp 98.0°F

## 2021-01-14 DIAGNOSIS — L84 Corns and callosities: Secondary | ICD-10-CM | POA: Diagnosis not present

## 2021-01-14 DIAGNOSIS — E1142 Type 2 diabetes mellitus with diabetic polyneuropathy: Secondary | ICD-10-CM

## 2021-01-17 NOTE — Progress Notes (Signed)
Subjective: 70 year old female presents the office today for follow-up evaluation of a wound on her left foot.  States that she is doing better.  Not seeing any swelling redness or drainage.  She denies any fevers or chills.  She has no other concerns today.    Objective: AAO x3, NAD Left foot submetatarsal 1 is a hyperkeratotic lesion.  Upon debridement appears that the wound has healed but is still preulcerative.  There is no edema, erythema, drainage or pus Infection.  No other open lesions are noted. Significant bunion is present. No pain with calf compression, swelling, warmth, erythema  Assessment: 70 year old female left foot ulceration, much improved  Plan: -All treatment options discussed with the patient including all alternatives, risks, complications.  -Sharply debrided hyperkeratotic tissue with any complications or bleeding.  This area is still preulcerative.  Discussed offloading, dressing changes still. -Monitor for any clinical signs or symptoms of infection and directed to call the office immediately should any occur or go to the ER.  Return in about 4 weeks (around 02/11/2021) for pre-ulcerative callus.  Trula Slade DPM

## 2021-01-20 DIAGNOSIS — Z853 Personal history of malignant neoplasm of breast: Secondary | ICD-10-CM | POA: Diagnosis not present

## 2021-02-15 ENCOUNTER — Encounter: Payer: Self-pay | Admitting: Podiatry

## 2021-02-15 ENCOUNTER — Ambulatory Visit: Payer: PPO

## 2021-02-15 ENCOUNTER — Ambulatory Visit: Payer: PPO | Admitting: Podiatry

## 2021-02-15 ENCOUNTER — Other Ambulatory Visit: Payer: Self-pay

## 2021-02-15 DIAGNOSIS — E1142 Type 2 diabetes mellitus with diabetic polyneuropathy: Secondary | ICD-10-CM

## 2021-02-15 DIAGNOSIS — L84 Corns and callosities: Secondary | ICD-10-CM

## 2021-02-17 NOTE — Progress Notes (Signed)
SITUATION Reason for Consult: Evaluation for Prefabricated Diabetic Shoes and Bilateral Custom Diabetic Inserts. Patient / Caregiver Report: Patient is ready for comfortable shoes that fit well  OBJECTIVE DATA: Patient History / Diagnosis: Diabetes Mellitus with complications  Presence of Diabetic Complications: - Peripheral Neuropathy  Current or Previous Devices: Apex sneakers  In-Person Foot Examination:  Skin presentation:   Thin, Shiny, Hairless Nail presentation:   Thick, Ingrown, With Fungus Ulcers & Callousing:   None  Toe / Foot Deformities:   - Pes Planus  - Hindfoot Valgus - Forefoot ABduction  - Hammertoes - Crossover toes - Midfoot collapse - hallux valgus   Sensation:    Bilateral diminished sensation  Shoe Size: 9.5-5E  ORTHOTIC RECOMMENDATION Recommended Devices: - 1x pair prefabricated PDAC approved diabetic shoes: [place] 628-E 9.5-5E - 3x pair custom-to-patient direct CAM carved diabetic insoles.   GOALS OF SHOES AND INSOLES - Reduce shear and pressure - Reduce / Prevent callus formation - Reduce / Prevent ulceration - Protect the fragile healing compromised diabetic foot.  Patient would benefit from diabetic shoes and inserts as patient has diabetes mellitus and the patient has one or more of the following conditions: - History of partial or complete amputation of the foot - History of previous foot ulceration. - History of pre-ulcerative callus - Peripheral neuropathy with evidence of callus formation - Foot deformity - Poor circulation  ACTIONS PERFORMED Patient was casted for insoles via crush box and measured for shoes via brannock device. Procedure was explained and patient tolerated procedure well. All questions were answered and concerns addressed.  PLAN Insurance to be verified and out of pocket cost communicated to patient. Once cost verified and agreed upon and diabetic certification received, casts are to be sent to [facility] for  fabrication. Patient is to be called for fitting when devices are ready.

## 2021-02-17 NOTE — Progress Notes (Signed)
Subjective: 70 year old female presents the office today for follow-up evaluation of a wound on her left foot.  Wound appears to be healed.  No swelling or redness or drainage that she reports.  She still keeps a bandage on the area.  No fevers or chills.  No other concerns.   Objective: AAO x3, NAD Left foot submetatarsal 1 is a hyperkeratotic lesion.  Upon debridement underlying wound appears to be healed there is 1 small area of dried blood is preulcerative there is no open sore identified today.  There is no edema, erythema, drainage or pus or any signs of infection. Significant bunion is present. No pain with calf compression, swelling, warmth, erythema  Assessment: 70 year old female left foot ulceration, improved  Plan: -All treatment options discussed with the patient including all alternatives, risks, complications.  -Debrided the hyperkeratotic lesion.  The wound appears to be healed.  Continue with offloading to help prevent reoccurrence of the area still preulcerative.  She did to get new diabetic inserts and she was seen today by our orthotist, Guadlupe Spanish for this.  -Continue daily foot inspection, glucose control.  Return in about 1 week (around 02/22/2021).  Trula Slade DPM

## 2021-02-19 DIAGNOSIS — Z89422 Acquired absence of other left toe(s): Secondary | ICD-10-CM | POA: Diagnosis not present

## 2021-02-19 DIAGNOSIS — I10 Essential (primary) hypertension: Secondary | ICD-10-CM | POA: Diagnosis not present

## 2021-02-19 DIAGNOSIS — Z89411 Acquired absence of right great toe: Secondary | ICD-10-CM | POA: Diagnosis not present

## 2021-02-19 DIAGNOSIS — F339 Major depressive disorder, recurrent, unspecified: Secondary | ICD-10-CM | POA: Diagnosis not present

## 2021-02-19 DIAGNOSIS — E1142 Type 2 diabetes mellitus with diabetic polyneuropathy: Secondary | ICD-10-CM | POA: Diagnosis not present

## 2021-02-19 DIAGNOSIS — Z9181 History of falling: Secondary | ICD-10-CM | POA: Diagnosis not present

## 2021-02-19 DIAGNOSIS — Z9989 Dependence on other enabling machines and devices: Secondary | ICD-10-CM | POA: Diagnosis not present

## 2021-02-22 DIAGNOSIS — E1121 Type 2 diabetes mellitus with diabetic nephropathy: Secondary | ICD-10-CM | POA: Diagnosis not present

## 2021-02-22 DIAGNOSIS — I129 Hypertensive chronic kidney disease with stage 1 through stage 4 chronic kidney disease, or unspecified chronic kidney disease: Secondary | ICD-10-CM | POA: Diagnosis not present

## 2021-02-22 DIAGNOSIS — N183 Chronic kidney disease, stage 3 unspecified: Secondary | ICD-10-CM | POA: Diagnosis not present

## 2021-02-22 DIAGNOSIS — E1142 Type 2 diabetes mellitus with diabetic polyneuropathy: Secondary | ICD-10-CM | POA: Diagnosis not present

## 2021-02-22 DIAGNOSIS — E1140 Type 2 diabetes mellitus with diabetic neuropathy, unspecified: Secondary | ICD-10-CM | POA: Diagnosis not present

## 2021-02-22 DIAGNOSIS — E11319 Type 2 diabetes mellitus with unspecified diabetic retinopathy without macular edema: Secondary | ICD-10-CM | POA: Diagnosis not present

## 2021-02-22 DIAGNOSIS — F339 Major depressive disorder, recurrent, unspecified: Secondary | ICD-10-CM | POA: Diagnosis not present

## 2021-02-22 DIAGNOSIS — E785 Hyperlipidemia, unspecified: Secondary | ICD-10-CM | POA: Diagnosis not present

## 2021-03-09 DIAGNOSIS — H02834 Dermatochalasis of left upper eyelid: Secondary | ICD-10-CM | POA: Diagnosis not present

## 2021-03-09 DIAGNOSIS — E113293 Type 2 diabetes mellitus with mild nonproliferative diabetic retinopathy without macular edema, bilateral: Secondary | ICD-10-CM | POA: Diagnosis not present

## 2021-03-09 DIAGNOSIS — H43813 Vitreous degeneration, bilateral: Secondary | ICD-10-CM | POA: Diagnosis not present

## 2021-03-09 DIAGNOSIS — H5203 Hypermetropia, bilateral: Secondary | ICD-10-CM | POA: Diagnosis not present

## 2021-03-09 DIAGNOSIS — H2513 Age-related nuclear cataract, bilateral: Secondary | ICD-10-CM | POA: Diagnosis not present

## 2021-03-26 ENCOUNTER — Other Ambulatory Visit: Payer: Self-pay

## 2021-03-26 ENCOUNTER — Ambulatory Visit (INDEPENDENT_AMBULATORY_CARE_PROVIDER_SITE_OTHER): Payer: PPO

## 2021-03-26 ENCOUNTER — Ambulatory Visit
Admission: EM | Admit: 2021-03-26 | Discharge: 2021-03-26 | Disposition: A | Discharge status: Home / Self Care | Payer: PPO | Attending: Physician Assistant | Admitting: Physician Assistant

## 2021-03-26 DIAGNOSIS — S91114A Laceration without foreign body of right lesser toe(s) without damage to nail, initial encounter: Secondary | ICD-10-CM

## 2021-03-26 DIAGNOSIS — M79674 Pain in right toe(s): Secondary | ICD-10-CM

## 2021-03-26 DIAGNOSIS — S91111A Laceration without foreign body of right great toe without damage to nail, initial encounter: Secondary | ICD-10-CM

## 2021-03-26 DIAGNOSIS — S92511A Displaced fracture of proximal phalanx of right lesser toe(s), initial encounter for closed fracture: Secondary | ICD-10-CM | POA: Diagnosis not present

## 2021-03-26 MED ORDER — DOXYCYCLINE HYCLATE 100 MG PO CAPS: 100.0000 mg | ORAL_CAPSULE | Freq: Two times a day (BID) | ORAL | 0 refills | Status: DC

## 2021-03-26 NOTE — ED Provider Notes (Signed)
[facility]    CSN: [id number] Arrival date & time: 03/26/21  1751      History   Chief Complaint Chief Complaint  Patient presents with   right foot injury    HPI [name] is a 71 y.o. female.   Pt reports she scraped her foot on the shower.  Patient reports she noticed bleeding from her foot.  Patient states that she does not have any feeling in her foot patient reports the toe is normally contracted.  Patient reports bleeding has continued.  Patient is diabetic and has neuropathy.  The history is provided by the patient. No language interpreter was used.   Past Medical History:  Diagnosis Date   Breast cancer ([place]) 12/2019   left breast DCIS   CKD (chronic kidney disease) 06/07/2016   Stage 2, GFR 60-89 ml/min   Diabetic polyneuropathy 06/07/2016   Dyslipidemia    Endometrial adenocarcinoma 2012   Fever blister    Hyperlipidemia    Hypertension, essential 06/07/2016   Major depressive disorder    Mixed dyslipidemia 06/07/2016   MRSA infection 10/2019   left foot wound   Obstructive sleep apnea 06/07/2016   does not use CPAP   Peripheral neuropathy    Retinopathy    Severe obesity    Skin ulcer of left foot with fat layer exposed 11/02/2016   Stroke    Asymptomatic, discovered via neuroimaging; small infarct in left parietal lobe; also concern for small b/l frontal infarcts   Tachycardia 07/19/2017   Type 2 diabetes mellitus with hyperglycemia, with long-term current use of insulin 06/07/2016   Unilateral primary osteoarthritis, right knee 04/16/2019    Patient Active Problem List   Diagnosis Date Noted   Neoplasm of uncertain behavior of skin 12/08/2020   Seborrheic keratosis 12/08/2020   Ductal carcinoma in situ (DCIS) of left breast 01/02/2020   MRSA (methicillin resistant staph aureus) culture positive 10/29/2019   Major depressive disorder    Stroke    Unilateral primary osteoarthritis, right knee 04/16/2019   Morbid (severe) obesity due  to excess calories ([place]) 04/16/2019   Mixed hyperlipidemia 07/19/2017   Palpitations 07/19/2017   Tachycardia 07/19/2017   Skin ulcer of left foot with fat layer exposed 11/02/2016   CKD (chronic kidney disease) stage 2, GFR 60-89 ml/min 06/07/2016   Type 2 diabetes mellitus with hyperglycemia, with long-term current use of insulin 06/07/2016   Diabetic polyneuropathy 06/07/2016   Mixed dyslipidemia 06/07/2016   Hypertension, essential 06/07/2016   Type 2 diabetes mellitus with both eyes affected by mild nonproliferative retinopathy without macular edema, with long-term current use of insulin ([place]) 06/07/2016   Obstructive sleep apnea 06/07/2016    Past Surgical History:  Procedure Laterality Date   ADRENALECTOMY     BREAST LUMPECTOMY WITH RADIOACTIVE SEED LOCALIZATION Left 02/25/2020   Procedure: LEFT BREAST LUMPECTOMY WITH RADIOACTIVE SEED LOCALIZATION;  Surgeon: Coralie Keens, MD;  Location: [facility];  Service: General;  Laterality: Left;   BUNIONECTOMY     CARDIAC CATHETERIZATION     CERVICAL SPINE SURGERY     CHOLECYSTECTOMY     DILATION AND CURETTAGE OF UTERUS     LAPAROSCOPIC SALPINGOOPHERECTOMY     THORACOTOMY     TONSILLECTOMY      OB History   No obstetric history on file.      Home Medications    Prior to Admission medications   Medication Sig Start Date End Date Taking? Authorizing Provider  doxycycline (VIBRAMYCIN) 100  MG capsule Take 1 capsule (100 mg total) by mouth 2 (two) times daily. 03/26/21  Yes Fransico Meadow, PA-C  aspirin EC 81 MG tablet Take 81 mg by mouth daily. Swallow whole.    [provider]  buPROPion (WELLBUTRIN XL) 300 MG 24 hr tablet Take 300 mg by mouth daily.    [provider]  carvedilol (COREG) 6.25 MG tablet Take 6.25 mg by mouth 2 (two) times daily. 12/23/20   [provider]  desvenlafaxine (PRISTIQ) 50 MG 24 hr tablet  09/07/20   [provider]  escitalopram (LEXAPRO) 10 MG  tablet Take 10 mg by mouth daily. 10/29/20   [provider]  FLUoxetine (PROZAC) 40 MG capsule Take 40 mg by mouth daily.  12/21/17   [provider]  furosemide (LASIX) 40 MG tablet Take 40 mg by mouth daily as needed.    [provider]  insulin NPH-regular Human (NOVOLIN 70/30) (70-30) 100 UNIT/ML injection Inject into the skin. 24 ml in the morning and 20 ml in the evening    [provider]  Lancets (ONETOUCH DELICA PLUS [id number]) MISC USE AS DIRECTED 3 TIMES A DAY 10/29/18   [provider]  losartan (COZAAR) 100 MG tablet Take 100 mg by mouth daily.     [provider]  losartan (COZAAR) 25 MG tablet  09/16/20   [provider]  metFORMIN (GLUCOPHAGE-XR) 500 MG 24 hr tablet Take 1,000 mg by mouth in the morning and at bedtime. PT TAKES 2000 MG DAILY 02/07/18   [provider]  MONOJECT INSULIN SYRINGE 31G X 5/16" 1 ML MISC See admin instructions. 05/28/19   [provider]  mupirocin ointment (BACTROBAN) 2 % Apply 1 application topically daily.    [provider]  naproxen (NAPROSYN) 250 MG tablet Take 1 tablet by mouth in the morning and at bedtime.    [provider]  nystatin cream (MYCOSTATIN) 1 APPLICATION TWICE A DAY EXTERNALLY 04/30/18   [provider]  ONETOUCH VERIO test strip  01/30/18   [provider]  pravastatin (PRAVACHOL) 40 MG tablet Take 1 tablet once a day for cholesterol    [provider]  tamoxifen (NOLVADEX) 20 MG tablet Take 1 tablet (20 mg total) by mouth daily. 04/30/20   Nicholas Lose, MD  traMADol (ULTRAM) 50 MG tablet Take 1 tablet (50 mg total) by mouth every 6 (six) hours as needed for moderate pain or severe pain. 02/25/20   Coralie Keens, MD    Family History Family History  Problem Relation Age of Onset   Hypertension Mother    Alzheimer's disease Mother    Heart attack Father    CAD Father    Alzheimer's disease Father     Bladder Cancer Father        dx late 80s   Diverticulitis Sister    Obesity Sister    Hypertension Sister    Voice disorder Brother    Breast cancer Paternal Aunt 96   Prostate cancer Paternal Uncle        dx mid 72s    Social History Social History   Tobacco Use   Smoking status: Never   Smokeless tobacco: Never  Vaping Use   Vaping Use: Never used  Substance Use Topics   Alcohol use: No   Drug use: No     Allergies   Amitriptyline, Ciprofloxacin, Penicillins, Sulfa antibiotics, and Sulfamethoxazole   Review of Systems Review of Systems  Skin:  Positive  for wound.  All other systems reviewed and are negative.   Physical Exam Triage Vital Signs ED Triage Vitals [03/26/21 1826]  Enc Vitals Group     BP (!) 143/83     Pulse Rate 96     Resp 18     Temp 98.4 F (36.9 C)     Temp Source Oral     SpO2 95 %     Weight      Height      Head Circumference      Peak Flow      Pain Score 0     Pain Loc      Pain Edu?      Excl. in [place]?    No data found.  Updated Vital Signs BP (!) 143/83 (BP Location: Left Arm)    Pulse 96    Temp 98.4 F (36.9 C) (Oral)    Resp 18    SpO2 95%   Visual Acuity Right Eye Distance:   Left Eye Distance:   Bilateral Distance:    Right Eye Near:   Left Eye Near:    Bilateral Near:     Physical Exam Vitals reviewed.  Constitutional:      Appearance: Normal appearance.  Cardiovascular:     Rate and Rhythm: Normal rate.  Pulmonary:     Effort: Pulmonary effort is normal.  Skin:    Comments: 5 cm laceration base of second toe Limited range of motion vascular is intact patient has no sensation.  Neurological:     General: No focal deficit present.     Mental Status: She is alert.  Psychiatric:        Mood and Affect: Mood normal.     UC Treatments / Results  Labs (all labs ordered are listed, but only abnormal results are displayed) Labs Reviewed - No data to display  EKG   Radiology DG Toe 2nd  Right  Result Date: 03/26/2021 CLINICAL DATA:  Injured right second toe in the shower today. EXAM: RIGHT SECOND TOE COMPARISON:  None. FINDINGS: Irregularity of the second toe proximal phalanx has well corticated margins and appears chronic no definite acute fracture. Hammertoe deformity of the digits limits detailed assessment. Prior great toe resection. There is an oblique fracture through the fifth toe proximal phalanx, that appears acute. IMPRESSION: 1. Chronic appearing irregularity of the second toe proximal phalanx. No definite acute fracture of the second toe. 2. Oblique fracture through the fifth toe proximal phalanx, possibly acute. Recommend correlation for focal tenderness. Electronically Signed   By: Keith Rake M.D.   On: 03/26/2021 19:15    Procedures Procedures (including critical care time)  Medications Ordered in UC Medications - No data to display  Initial Impression / Assessment and Plan / UC Course  I have reviewed the triage vital signs and the nursing notes.  Pertinent labs & imaging results that were available during my care of the patient were reviewed by me and considered in my medical decision making (see chart for details).     MDM: Patient is diabetic with neuropathy patient has a 5 mm wound to the base of her second toe sutures deferred as I am concerned that closing the wound is likely to increase patient's risk of infection x-ray shows a fracture to the occipital phalanx phalanx of the fifth toe no fracture to area of laceration of the second toe patient is given a prescription for doxycycline she is counseled at length on the high risk  of infection due to area of wound and diabetes patient is followed by the wound care center she reports she will call them on Monday for follow-up she is advised to return to urgent care for wound recheck if she is unable to see the wound care clinic or her primary care provider. Final Clinical Impressions(s) / UC Diagnoses    Final diagnoses:  Laceration of great toe of right foot, foreign body presence unspecified, nail damage status unspecified, initial encounter     Discharge Instructions      See your Physician for recheck on Monday or return here if your Physician can not recheck you    ED Prescriptions     Medication Sig Dispense Auth. Provider   doxycycline (VIBRAMYCIN) 100 MG capsule Take 1 capsule (100 mg total) by mouth 2 (two) times daily. 20 capsule Fransico Meadow, [place]      PDMP not reviewed this encounter. An After Visit Summary was printed and given to the patient.    Fransico Meadow, [place] 03/26/21 1959

## 2021-03-26 NOTE — Discharge Instructions (Addendum)
See your Physician for recheck on Monday or return here if your Physician can not recheck you

## 2021-03-26 NOTE — ED Triage Notes (Signed)
Pt c/o injury to right foot today in shower. States has diabetic neuropathy.

## 2021-03-30 ENCOUNTER — Encounter (HOSPITAL_BASED_OUTPATIENT_CLINIC_OR_DEPARTMENT_OTHER): Payer: PPO | Attending: Internal Medicine | Admitting: Internal Medicine

## 2021-03-30 ENCOUNTER — Other Ambulatory Visit: Payer: Self-pay

## 2021-03-30 DIAGNOSIS — N183 Chronic kidney disease, stage 3 unspecified: Secondary | ICD-10-CM | POA: Insufficient documentation

## 2021-03-30 DIAGNOSIS — E1140 Type 2 diabetes mellitus with diabetic neuropathy, unspecified: Secondary | ICD-10-CM | POA: Diagnosis not present

## 2021-03-30 DIAGNOSIS — I129 Hypertensive chronic kidney disease with stage 1 through stage 4 chronic kidney disease, or unspecified chronic kidney disease: Secondary | ICD-10-CM | POA: Diagnosis not present

## 2021-03-30 DIAGNOSIS — Z923 Personal history of irradiation: Secondary | ICD-10-CM | POA: Insufficient documentation

## 2021-03-30 DIAGNOSIS — Z794 Long term (current) use of insulin: Secondary | ICD-10-CM | POA: Diagnosis not present

## 2021-03-30 DIAGNOSIS — L97512 Non-pressure chronic ulcer of other part of right foot with fat layer exposed: Secondary | ICD-10-CM | POA: Insufficient documentation

## 2021-03-30 DIAGNOSIS — Z89411 Acquired absence of right great toe: Secondary | ICD-10-CM | POA: Insufficient documentation

## 2021-03-30 DIAGNOSIS — E1122 Type 2 diabetes mellitus with diabetic chronic kidney disease: Secondary | ICD-10-CM | POA: Diagnosis not present

## 2021-03-30 DIAGNOSIS — E11621 Type 2 diabetes mellitus with foot ulcer: Secondary | ICD-10-CM | POA: Diagnosis not present

## 2021-03-30 DIAGNOSIS — M1711 Unilateral primary osteoarthritis, right knee: Secondary | ICD-10-CM | POA: Diagnosis not present

## 2021-03-30 DIAGNOSIS — I10 Essential (primary) hypertension: Secondary | ICD-10-CM

## 2021-03-30 NOTE — Progress Notes (Signed)
[name] ([id number]) Visit Report for 03/30/2021 Chief Complaint Document Details Patient Name: Date of Service: Holly Alar A. 03/30/2021 8:15 A M Medical Record Number: [id number] Patient Account Number: [id number] Date of Birth/Sex: Treating RN: [birth year] (71 y.o. [name] Primary Care Provider: Harlan [name] Other Clinician: Referring Provider: Treating Provider/Extender: [name] Weeks in Treatment: 0 Information Obtained from: Patient Chief Complaint Left plantar ulcer 11/12/2019; patient is here referred by podiatry for review of a wound on the left first plantar metatarsal head 03/30/2021; right second toe wound s/p Trauma Electronic Signature(s) Signed: 03/30/2021 9:29:30 AM By: Holly Shan DO Entered By: [name] on 03/30/2021 09:23:08 -------------------------------------------------------------------------------- HPI Details Patient Name: Date of Service: [name], Holly RA H A. 03/30/2021 8:15 A M Medical Record Number: [id number] Patient Account Number: [id number] Date of Birth/Sex: Treating RN: 03-23-1950 (71 y.o. [name] Primary Care Provider: Harlan [name] Other Clinician: Referring Provider: Treating Provider/Extender: [name] in Treatment: 0 History of Present Illness HPI Description: 05/10/17 patient presents today for initial evaluation and our clinic concerning issues that she has been having with an ulceration on the plantar aspect of the left first metatarsal head. She has been seen a podiatrist [name] for this since August 2018. Subsequently he has recommended bunion surgery as that seems to be the culprit for why there is pressure and friction occurring at the site causing the callous and subsequently the wound. 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 With that being said I do have notes from several of his visits one of which does include an x-ray documentation stating that she had a negative x-ray. There was obviously the bunion the notes I have extended from 11 deformity. With that being said she has not had an MRI fortunately this wound does not probe to bone. The notes I have extended from February 08, 2017 through April 14, 2017. During that course she was also placed on antibiotics to help with an infection two weeks ago and this was doxycycline. Fortunately that seems to have completely resolved any infection issues that she may have had. Unfortunately this is an ulcer that she has actually been dealing with since April 2018. She just initially was trying to treat it and care for it on her own. Her most recent hemoglobin A1c was 5.8, she is a non-smoker, she does have a right first toe amputation and a left third toe amputation. Fortunately she's not having any discomfort at the site. 05/17/17 on evaluation today patient's ulcer on the plantar aspect of the first metatarsal region appears to be close at this point. There does not appear to be any evidence of ulcer opening. This is on initial inspection. She has not really had any discomfort but honestly she really was not experiencing a lot of pain even before. There is no evidence of infection or fluctuation under the callous which is present at this point. ADMISSION 11/12/2019 This is a 71 year old woman that we previously had in this clinic in 2017 for two visits with a wound I think roughly in the same area. At that point she had a bunion in the left. She has apparently had surgery on this since then. Looking through care everywhere we can see that she has had follow-up with podiatry from 06/12/2019 through 10/28/2019 for a  wound on the plantar aspect of her left first MTP. She states when this started she was having severe knee pain on the right which  forced her to alter her gait. She has recently received doxycycline which she is finished and Bactroban. She is back to using Medihoney on the wound. She is offloading with the Pegasys shoe. Past medical history includes type 2 diabetes with a recent hemoglobin A1c of 6.8, severe type II diabetic neuropathy, history of a CVA, stage III chronic renal failure hypertension, depression, of bilateral previous bunion surgery, right first toe amputation and a partial left third toe amputation for osteomyelitis. The patient states that she recently had work to do in her mother's house in [place] and so she was on her feet quite a lot which delayed the healing. ABI in our clinic was 1.1 on the left 8/31; plantar aspect of her left first metatarsal head. Using silver collagen. This is probably a surgical wound 9/14; this is a patient who had a wound on the plantar aspect of her left first metatarsal head in the setting of a significant bunion deformity. We use cervical collagen and a offloading shoe. She has significant diabetic neuropathy probably some degree of a Charcot deformity in her feet. This is closed over today. Readmission 03/30/2021 [name] is a 71 year old female with a past medical history of insulin-dependent controlled type 2 diabetes s/p right great toe amputation, right knee osteoarthritis and hypertension that presents to the clinic for a second right toe wound. She states she was getting out of the bathtub when her foot got caught the tub and the skin to her second toe was split open. She noticed this after the bath when she saw blood on the floor. She has neuropathy in her feet. She visited the ED following the event on 03/26/2021. They obtained x-rays that showed a fracture to the fifth toe but no fracture to the second toe. She was started on doxycycline. She currently denies signs of infection. She has been keeping the area covered with a Band-Aid. Electronic  Signature(s) Signed: 03/30/2021 9:29:30 AM By: Holly Shan DO Entered By: [name] on 03/30/2021 09:26:39 -------------------------------------------------------------------------------- Physical Exam Details Patient Name: Date of Service: [name], [name] RA H A. 03/30/2021 8:15 A M Medical Record Number: [id number] Patient Account Number: [id number] Date of Birth/Sex: Treating RN: 29-Dec-1950 (71 y.o. [name] Primary Care Provider: Harlan [name] Other Clinician: Referring Provider: Treating Provider/Extender: [name] Weeks in Treatment: 0 Constitutional respirations regular, non-labored and within target range for patient.. Cardiovascular 2+ dorsalis pedis/posterior tibialis pulses. Psychiatric pleasant and cooperative. Notes Right foot: T the plantar aspect of the second toe there is a slitlike open wound with granulation tissue present. This does not probe to bone. No surrounding o signs of infection. Electronic Signature(s) Signed: 03/30/2021 9:29:30 AM By: Holly Shan DO Entered By: [name] on 03/30/2021 09:27:24 -------------------------------------------------------------------------------- Physician Orders Details Patient Name: Date of Service: [name], Holly RA H A. 03/30/2021 8:15 A M Medical Record Number: [id number] Patient Account Number: [id number] Date of Birth/Sex: Treating RN: [birth year] (71 y.o. [name] Primary Care Provider: Harlan [name] Other Clinician: Referring Provider: Treating Provider/Extender: [name] Weeks in Treatment: 0 Verbal / Phone Orders: No Diagnosis Coding Follow-up Appointments ppointment in 1 week. - Dr. Heber [place] Return A Bathing/ Shower/ Hygiene May shower and wash wound with soap and water. Edema Control - Lymphedema / SCD / Other Bilateral Lower Extremities Elevate legs to the  level of the heart or above for 30 minutes daily and/or when  sitting, a frequency of: - throughout the day Exercise regularly Wound Treatment Wound #3 - T Second oe Wound Laterality: Plantar, Right Prim Dressing: Promogran Prisma Matrix, 4.34 (sq in) (silver collagen) 1 x Per Day ary Discharge Instructions: Moisten collagen with saline or hydrogel Secondary Dressing: Woven Gauze Sponges 2x2 in 1 x Per Day Discharge Instructions: Apply over primary dressing as directed. Secured With: Child psychotherapist, Sterile 2x75 (in/in) 1 x Per Day Discharge Instructions: Secure with stretch gauze as directed. Secured With: Paper Tape, 1x10 (in/yd) 1 x Per Day Discharge Instructions: Secure dressing with tape as directed. Electronic Signature(s) Signed: 03/30/2021 9:29:30 AM By: Holly Shan DO Entered By: [name] on 03/30/2021 09:27:43 -------------------------------------------------------------------------------- Problem List Details Patient Name: Date of Service: [name], Holly RA H A. 03/30/2021 8:15 A M Medical Record Number: [id number] Patient Account Number: [id number] Date of Birth/Sex: Treating RN: 19-Apr-1950 (71 y.o. [name] Primary Care Provider: Harlan [name] Other Clinician: Referring Provider: Treating Provider/Extender: [name] Weeks in Treatment: 0 Active Problems ICD-10 Encounter Code Description Active Date MDM Diagnosis L97.512 Non-pressure chronic ulcer of other part of right foot with fat layer exposed 03/30/2021 No Yes E11.621 Type 2 diabetes mellitus with foot ulcer 03/30/2021 No Yes I10 Essential (primary) hypertension 03/30/2021 No Yes M17.11 Unilateral primary osteoarthritis, right knee 03/30/2021 No Yes Inactive Problems Resolved Problems Electronic Signature(s) Signed: 03/30/2021 9:29:30 AM By: Holly Shan DO Entered By: [name] on 03/30/2021 09:22:21 -------------------------------------------------------------------------------- Progress Note  Details Patient Name: Date of Service: [name], Holly RA H A. 03/30/2021 8:15 A M Medical Record Number: [id number] Patient Account Number: [id number] Date of Birth/Sex: Treating RN: Nov 05, 1950 (71 y.o. [name] Primary Care Provider: Harlan [name] Other Clinician: Referring Provider: Treating Provider/Extender: [name] Weeks in Treatment: 0 Subjective Chief Complaint Information obtained from Patient Left plantar ulcer 11/12/2019; patient is here referred by podiatry for review of a wound on the left first plantar metatarsal head 03/30/2021; right second toe wound s/p Trauma History of Present Illness (HPI) 05/10/17 patient presents today for initial evaluation and our clinic concerning issues that she has been having with an ulceration on the plantar aspect of the left first metatarsal head. She has been seen a podiatrist [name] for this since August 2018. Subsequently he has recommended bunion surgery as that seems to be the culprit for why there is pressure and friction occurring at the site causing the callous and subsequently the wound. 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 With that being said I do have notes from several of his visits one of which does include an x-ray documentation stating that she had a negative x-ray. There was obviously the bunion the notes I have extended from 11 deformity. With that being said she has not had an MRI fortunately this wound does not probe to bone. The notes I have extended from February 08, 2017 through April 14, 2017. During that course she was also placed on antibiotics to help with an infection two weeks ago and this was doxycycline. Fortunately that seems to have completely resolved any infection issues that she may have had. Unfortunately this is an ulcer that she  has actually been dealing with since April 2018. She just initially was trying to treat  it and care for it on her own. Her most recent hemoglobin A1c was 5.8, she is a non-smoker, she does have a right first toe amputation and a left third toe amputation. Fortunately she's not having any discomfort at the site. 05/17/17 on evaluation today patient's ulcer on the plantar aspect of the first metatarsal region appears to be close at this point. There does not appear to be any evidence of ulcer opening. This is on initial inspection. She has not really had any discomfort but honestly she really was not experiencing a lot of pain even before. There is no evidence of infection or fluctuation under the callous which is present at this point. ADMISSION 11/12/2019 This is a 71 year old woman that we previously had in this clinic in 2017 for two visits with a wound I think roughly in the same area. At that point she had a bunion in the left. She has apparently had surgery on this since then. Looking through care everywhere we can see that she has had follow-up with podiatry from 06/12/2019 through 10/28/2019 for a wound on the plantar aspect of her left first MTP. She states when this started she was having severe knee pain on the right which forced her to alter her gait. She has recently received doxycycline which she is finished and Bactroban. She is back to using Medihoney on the wound. She is offloading with the Pegasys shoe. Past medical history includes type 2 diabetes with a recent hemoglobin A1c of 6.8, severe type II diabetic neuropathy, history of a CVA, stage III chronic renal failure hypertension, depression, of bilateral previous bunion surgery, right first toe amputation and a partial left third toe amputation for osteomyelitis. The patient states that she recently had work to do in her mother's house in [place] and so she was on her feet quite a lot which delayed the healing. ABI in our clinic was  1.1 on the left 8/31; plantar aspect of her left first metatarsal head. Using silver collagen. This is probably a surgical wound 9/14; this is a patient who had a wound on the plantar aspect of her left first metatarsal head in the setting of a significant bunion deformity. We use cervical collagen and a offloading shoe. She has significant diabetic neuropathy probably some degree of a Charcot deformity in her feet. This is closed over today. Readmission 03/30/2021 [name] is a 71 year old female with a past medical history of insulin-dependent controlled type 2 diabetes s/p right great toe amputation, right knee osteoarthritis and hypertension that presents to the clinic for a second right toe wound. She states she was getting out of the bathtub when her foot got caught the tub and the skin to her second toe was split open. She noticed this after the bath when she saw blood on the floor. She has neuropathy in her feet. She visited the ED following the event on 03/26/2021. They obtained x-rays that showed a fracture to the fifth toe but no fracture to the second toe. She was started on doxycycline. She currently denies signs of infection. She has been keeping the area covered with a Band-Aid. Patient History Information obtained from Patient. Allergies penicillin (Severity: Moderate, Reaction: Rash), Sulfa (Sulfonamide Antibiotics) (Severity: Moderate, Reaction: Rash), Cipro (Reaction: irritability), amitriptyline (Reaction: irritable) Family History Cancer - Father, Hypertension - Mother,Father,Siblings, Stroke - Mother,Father, No family history of Diabetes, Heart Disease, Hereditary Spherocytosis, Kidney Disease, Lung Disease, Seizures, Thyroid Problems, Tuberculosis. Social History Never smoker, Marital Status - Single,  Alcohol Use - Never, Drug Use - No History, Caffeine Use - Rarely. Medical History Eyes Patient has history of Cataracts Denies history of Glaucoma, Optic  Neuritis Ear/Nose/Mouth/Throat Denies history of Chronic sinus problems/congestion, Middle ear problems Hematologic/Lymphatic Denies history of Anemia, Hemophilia, Human Immunodeficiency Virus, Lymphedema, Sickle Cell Disease Respiratory Patient has history of Sleep Apnea Denies history of Aspiration, Asthma, Chronic Obstructive Pulmonary Disease (COPD), Pneumothorax, Tuberculosis Cardiovascular Patient has history of Hypertension Denies history of Angina, Arrhythmia, Congestive Heart Failure, Coronary Artery Disease, Deep Vein Thrombosis, Hypotension, Myocardial Infarction, Peripheral Arterial Disease, Peripheral Venous Disease, Phlebitis, Vasculitis Gastrointestinal Denies history of Cirrhosis , Colitis, Crohnoos, Hepatitis A, Hepatitis B, Hepatitis C Endocrine Patient has history of Type II Diabetes Denies history of Type I Diabetes Genitourinary Denies history of End Stage Renal Disease Immunological Denies history of Lupus Erythematosus, Raynaudoos, Scleroderma Integumentary (Skin) Denies history of History of Burn Musculoskeletal Patient has history of Osteoarthritis, Osteomyelitis - Hx in 2007 (left 3rd toe) Denies history of Gout, Rheumatoid Arthritis Neurologic Patient has history of Neuropathy Denies history of Dementia, Quadriplegia, Paraplegia, Seizure Disorder Oncologic Patient has history of Received Radiation - 03/2020 Denies history of Received Chemotherapy Psychiatric Patient has history of Confinement Anxiety Denies history of Anorexia/bulimia Hospitalization/Surgery History - Amputation of right great toe. - Tendon repair. - Hysterectomy. - Osteomyelitis left 3rd toe. - cervical fusion with cage. - left breast lumpectomy. Medical A Surgical History Notes nd Constitutional Symptoms (General Health) morbid obesity Cardiovascular Takotsubo cardiomyopathy 2012, hyperlipidemia Genitourinary CKD stage 3 Musculoskeletal left leg  weakness Neurologic CVA Oncologic hx endometrial cancer, left breast CA Psychiatric depression Review of Systems (ROS) Constitutional Symptoms (General Health) Complains or has symptoms of Fatigue. Denies complaints or symptoms of Fever, Chills, Marked Weight Change. Eyes Complains or has symptoms of Glasses / Contacts. Denies complaints or symptoms of Dry Eyes, Vision Changes. Ear/Nose/Mouth/Throat Denies complaints or symptoms of Chronic sinus problems or rhinitis. Respiratory Complains or has symptoms of Shortness of Breath. Denies complaints or symptoms of Chronic or frequent coughs. Cardiovascular Denies complaints or symptoms of Chest pain. Gastrointestinal Denies complaints or symptoms of Frequent diarrhea, Nausea, Vomiting. Endocrine Denies complaints or symptoms of Heat/cold intolerance. Genitourinary Denies complaints or symptoms of Frequent urination. Integumentary (Skin) Complains or has symptoms of Wounds - both feet. Musculoskeletal Complains or has symptoms of Muscle Weakness. Denies complaints or symptoms of Muscle Pain. Neurologic Complains or has symptoms of Numbness/parasthesias. Psychiatric Complains or has symptoms of Claustrophobia. Denies complaints or symptoms of Suicidal. Objective Constitutional respirations regular, non-labored and within target range for patient.. Vitals Time Taken: 8:27 AM, Height: 70 in, Source: Stated, Weight: 260 lbs, BMI: 37.3, Temperature: 98.1 F, Pulse: 90 bpm, Respiratory Rate: 20 breaths/min, Blood Pressure: 141/82 mmHg. General Notes: pt reports not checking her blood sugars regularly Cardiovascular 2+ dorsalis pedis/posterior tibialis pulses. Psychiatric pleasant and cooperative. General Notes: Right foot: T the plantar aspect of the second toe there is a slitlike open wound with granulation tissue present. This does not probe to bone. o No surrounding signs of infection. Integumentary (Hair, Skin) Wound #3  status is Open. Original cause of wound was Trauma. The date acquired was: 03/26/2021. The wound is located on the Right,Plantar T Second. The oe wound measures 0.3cm length x 0.2cm width x 0.1cm depth; 0.047cm^2 area and 0.005cm^3 volume. There is Fat Layer (Subcutaneous Tissue) exposed. There is no tunneling or undermining noted. There is a medium amount of serosanguineous drainage noted. The wound margin is flat and intact. There is large (  67-100%) red granulation within the wound bed. There is no necrotic tissue within the wound bed. Assessment Active Problems ICD-10 Non-pressure chronic ulcer of other part of right foot with fat layer exposed Type 2 diabetes mellitus with foot ulcer Essential (primary) hypertension Unilateral primary osteoarthritis, right knee Patient presents with a 1 week history of open wound to her second right toe caused by trauma. The area appears clean with no signs of infection. I recommended collagen daily to this area and keeping the area covered. Follow-up in 1 week. Plan Follow-up Appointments: Return Appointment in 1 week. - Dr. Heber [place] Bathing/ Shower/ Hygiene: May shower and wash wound with soap and water. Edema Control - Lymphedema / SCD / Other: Elevate legs to the level of the heart or above for 30 minutes daily and/or when sitting, a frequency of: - throughout the day Exercise regularly WOUND #3: - T Second Wound Laterality: Plantar, Right oe Prim Dressing: Promogran Prisma Matrix, 4.34 (sq in) (silver collagen) 1 x Per Day/ ary Discharge Instructions: Moisten collagen with saline or hydrogel Secondary Dressing: Woven Gauze Sponges 2x2 in 1 x Per Day/ Discharge Instructions: Apply over primary dressing as directed. Secured With: Child psychotherapist, Sterile 2x75 (in/in) 1 x Per Day/ Discharge Instructions: Secure with stretch gauze as directed. Secured With: Paper T ape, 1x10 (in/yd) 1 x Per Day/ Discharge Instructions: Secure  dressing with tape as directed. 1. Collagen daily 2. Follow-up in 1 week Electronic Signature(s) Signed: 03/30/2021 9:29:30 AM By: Holly Shan DO Entered By: [name] on 03/30/2021 09:28:42 -------------------------------------------------------------------------------- HxROS Details Patient Name: Date of Service: [name], Holly RA H A. 03/30/2021 8:15 A M Medical Record Number: [id number] Patient Account Number: [id number] Date of Birth/Sex: Treating RN: 11-24-1950 (71 y.o. [name] Primary Care Provider: Harlan [name] Other Clinician: Referring Provider: Treating Provider/Extender: [name] Weeks in Treatment: 0 Information Obtained From Patient Constitutional Symptoms ([place]) Complaints and Symptoms: Positive for: Fatigue Negative for: Fever; Chills; Marked Weight Change Medical History: Past Medical History Notes: morbid obesity Eyes Complaints and Symptoms: Positive for: Glasses / Contacts Negative for: Dry Eyes; Vision Changes Medical History: Positive for: Cataracts Negative for: Glaucoma; Optic Neuritis Ear/Nose/Mouth/Throat Complaints and Symptoms: Negative for: Chronic sinus problems or rhinitis Medical History: Negative for: Chronic sinus problems/congestion; Middle ear problems Respiratory Complaints and Symptoms: Positive for: Shortness of Breath Negative for: Chronic or frequent coughs Medical History: Positive for: Sleep Apnea Negative for: Aspiration; Asthma; Chronic Obstructive Pulmonary Disease (COPD); Pneumothorax; Tuberculosis Cardiovascular Complaints and Symptoms: Negative for: Chest pain Medical History: Positive for: Hypertension Negative for: Angina; Arrhythmia; Congestive Heart Failure; Coronary Artery Disease; Deep Vein Thrombosis; Hypotension; Myocardial Infarction; Peripheral Arterial Disease; Peripheral Venous Disease; Phlebitis; Vasculitis Past Medical History Notes: Takotsubo  cardiomyopathy 2012, hyperlipidemia Gastrointestinal Complaints and Symptoms: Negative for: Frequent diarrhea; Nausea; Vomiting Medical History: Negative for: Cirrhosis ; Colitis; Crohns; Hepatitis A; Hepatitis B; Hepatitis C Endocrine Complaints and Symptoms: Negative for: Heat/cold intolerance Medical History: Positive for: Type II Diabetes Negative for: Type I Diabetes Time with diabetes: over 20 years Treated with: Insulin, Oral agents Blood sugar tested every day: Yes Tested : daily Genitourinary Complaints and Symptoms: Negative for: Frequent urination Medical History: Negative for: End Stage Renal Disease Past Medical History Notes: CKD stage 3 Integumentary (Skin) Complaints and Symptoms: Positive for: Wounds - both feet Medical History: Negative for: History of Burn Musculoskeletal Complaints and Symptoms: Positive for: Muscle Weakness Negative for: Muscle Pain Medical History: Positive for: Osteoarthritis; Osteomyelitis - Hx  in 2007 (left 3rd toe) Negative for: Gout; Rheumatoid Arthritis Past Medical History Notes: left leg weakness Neurologic Complaints and Symptoms: Positive for: Numbness/parasthesias Medical History: Positive for: Neuropathy Negative for: Dementia; Quadriplegia; Paraplegia; Seizure Disorder Past Medical History Notes: CVA Psychiatric Complaints and Symptoms: Positive for: Claustrophobia Negative for: Suicidal Medical History: Positive for: Confinement Anxiety Negative for: Anorexia/bulimia Past Medical History Notes: depression Hematologic/Lymphatic Medical History: Negative for: Anemia; Hemophilia; Human Immunodeficiency Virus; Lymphedema; Sickle Cell Disease Immunological Medical History: Negative for: Lupus Erythematosus; Raynauds; Scleroderma Oncologic Medical History: Positive for: Received Radiation - 03/2020 Negative for: Received Chemotherapy Past Medical History Notes: hx endometrial cancer, left breast CA HBO  Extended History Items Eyes: Cataracts Immunizations Pneumococcal Vaccine: Received Pneumococcal Vaccination: Yes Received Pneumococcal Vaccination On or After 60th Birthday: Yes Implantable Devices None Hospitalization / Surgery History Type of Hospitalization/Surgery Amputation of right great toe Tendon repair Hysterectomy Osteomyelitis left 3rd toe cervical fusion with cage left breast lumpectomy Family and Social History Cancer: Yes - Father; Diabetes: No; Heart Disease: No; Hereditary Spherocytosis: No; Hypertension: Yes - Mother,Father,Siblings; Kidney Disease: No; Lung Disease: No; Seizures: No; Stroke: Yes - Mother,Father; Thyroid Problems: No; Tuberculosis: No; Never smoker; Marital Status - Single; Alcohol Use: Never; Drug Use: No History; Caffeine Use: Rarely; Financial Concerns: No; Food, Clothing or Shelter Needs: No; Support System Lacking: No; Transportation Concerns: No Electronic Signature(s) Signed: 03/30/2021 9:29:30 AM By: Holly Shan DO Signed: 03/30/2021 5:43:46 PM By: Baruch Gouty RN, BSN Entered By: Baruch Gouty on 03/30/2021 08:36:05 -------------------------------------------------------------------------------- SuperBill Details Patient Name: Date of Service: [name], Holly RA H A. 03/30/2021 Medical Record Number: [id number] Patient Account Number: [id number] Date of Birth/Sex: Treating RN: 03-10-1951 (71 y.o. [name] Primary Care Provider: Harlan [name] Other Clinician: Referring Provider: Treating Provider/Extender: [name] Weeks in Treatment: 0 Diagnosis Coding ICD-10 Codes Code Description [phone] Non-pressure chronic ulcer of other part of right foot with fat layer exposed E11.621 Type 2 diabetes mellitus with foot ulcer I10 Essential (primary) hypertension M17.11 Unilateral primary osteoarthritis, right knee Facility Procedures CPT4 Code: [id number] Description: 99214 - WOUND CARE VISIT-LEV 4 EST  PT Modifier: Quantity: 1 Physician Procedures : CPT4 Code Description Modifier [id number] - WC PHYS LEVEL 3 - EST PT ICD-10 Diagnosis Description L97.512 Non-pressure chronic ulcer of other part of right foot with fat layer exposed E11.621 Type 2 diabetes mellitus with foot ulcer I10 Essential  (primary) hypertension M17.11 Unilateral primary osteoarthritis, right knee Quantity: 1 Electronic Signature(s) Signed: 03/30/2021 9:29:30 AM By: Holly Shan DO Entered By: [name] on 03/30/2021 09:29:03

## 2021-03-30 NOTE — Progress Notes (Signed)
KALLE, BERNATH ([id number]) Visit Report for 03/30/2021 Abuse/Suicide Risk Screen Details Patient Name: Date of Service: Holly Alar A. 03/30/2021 8:15 A M Medical Record Number: [id number] Patient Account Number: [id number] Date of Birth/Sex: Treating RN: 10-22-1950 (71 y.o. [name] Primary Care Bailey Kolbe: [name] Other Clinician: Referring Luisana Lutzke: Treating Katalena Malveaux/Extender: [name] Weeks in Treatment: 0 Abuse/Suicide Risk Screen Items Answer ABUSE RISK SCREEN: Has anyone close to you tried to hurt or harm you recentlyo No Do you feel uncomfortable with anyone in your familyo No Has anyone forced you do things that you didnt want to doo No Electronic Signature(s) Signed: 03/30/2021 5:43:46 PM By: Baruch Gouty RN, BSN Entered By: Baruch Gouty on 03/30/2021 08:36:13 -------------------------------------------------------------------------------- Activities of Daily Living Details Patient Name: Date of Service: Holly Alar A. 03/30/2021 8:15 A M Medical Record Number: [id number] Patient Account Number: [id number] Date of Birth/Sex: Treating RN: 04-Feb-1951 (71 y.o. [name] Primary Care Balen Woolum: [name] Other Clinician: Referring Isamu Trammel: Treating Verenise Moulin/Extender: [name] Weeks in Treatment: 0 Activities of Daily Living Items Answer Activities of Daily Living (Please select one for each item) Drive Automobile Completely Able T Medications ake Completely Able Use T elephone Completely Able Care for Appearance Completely Able Use T oilet Completely Able Bath / Shower Completely Able Dress Self Completely Able Feed Self Need Assistance Walk Need Assistance Get In / Out Bed Completely Able Housework Completely Able Prepare Meals Completely [place] for Self Completely Able Electronic Signature(s) Signed: 03/30/2021 5:43:46 PM By: Baruch Gouty RN, BSN Entered By: Baruch Gouty on 03/30/2021 08:37:02 -------------------------------------------------------------------------------- Education Screening Details Patient Name: Date of Service: [name], [name] 03/30/2021 8:15 A M Medical Record Number: [id number] Patient Account Number: [id number] Date of Birth/Sex: Treating RN: [birth year] (71 y.o. [name] Primary Care Boden Stucky: [name] Other Clinician: Referring Karuna Balducci: Treating Blain Hunsucker/Extender: [name] in Treatment: 0 Primary Learner Assessed: Patient Learning Preferences/Education Level/Primary Language Learning Preference: Explanation, Demonstration, Printed Material Highest Education Level: College or Above Preferred Language: English Cognitive Barrier Language Barrier: No Translator Needed: No Memory Deficit: No Emotional Barrier: No Cultural/Religious Beliefs Affecting Medical Care: No Physical Barrier Impaired Vision: Yes Glasses Impaired Hearing: No Decreased Hand dexterity: No Knowledge/Comprehension Knowledge Level: High Comprehension Level: High Ability to understand written instructions: High Ability to understand verbal instructions: High Motivation Anxiety Level: Calm Cooperation: Cooperative Education Importance: Acknowledges Need Interest in Health Problems: Asks Questions Perception: Coherent Willingness to Engage in Self-Management High Activities: Readiness to Engage in Self-Management High Activities: Electronic Signature(s) Signed: 03/30/2021 5:43:46 PM By: Baruch Gouty RN, BSN Entered By: Baruch Gouty on 03/30/2021 08:38:12 -------------------------------------------------------------------------------- Fall Risk Assessment Details Patient Name: Date of Service: [name], [name] 03/30/2021 8:15 A M Medical Record Number: [id number] Patient Account Number: [id number] Date of Birth/Sex: Treating RN: [birth year] (70 y.o.  [name] Primary Care Kadeshia Kasparian: [name] Other Clinician: Referring Clements Toro: Treating Velta Rockholt/Extender: [name] in Treatment: 0 Fall Risk Assessment Items Have you had 2 or more falls in the last 12 monthso 0 Yes Have you had any fall that resulted in injury in the last 12 monthso 0 Yes FALLS RISK SCREEN History of falling - immediate or within 3 months 0 No Secondary diagnosis (Do you have 2 or more medical diagnoseso) 0 No Ambulatory aid None/bed rest/wheelchair/nurse 0 No Crutches/cane/walker 15 Yes Furniture 0 No Intravenous therapy Access/Saline/Heparin  Lock 0 No Gait/Transferring Normal/ bed rest/ wheelchair 0 Yes Weak (short steps with or without shuffle, stooped but able to lift head while walking, may seek 0 No support from furniture) Impaired (short steps with shuffle, may have difficulty arising from chair, head down, impaired 0 No balance) Mental Status Oriented to own ability 0 Yes Electronic Signature(s) Signed: 03/30/2021 5:43:46 PM By: Baruch Gouty RN, BSN Entered By: Baruch Gouty on 03/30/2021 08:39:29 -------------------------------------------------------------------------------- Foot Assessment Details Patient Name: Date of Service: [name], [name] 03/30/2021 8:15 A M Medical Record Number: [id number] Patient Account Number: [id number] Date of Birth/Sex: Treating RN: 06/05/1950 (71 y.o. [name] Primary Care Larraine Argo: [name] Other Clinician: Referring Daquisha Clermont: Treating Rahm Minix/Extender: [name] Weeks in Treatment: 0 Foot Assessment Items Site Locations + = Sensation present, - = Sensation absent, C = Callus, U = Ulcer R = Redness, W = Warmth, M = Maceration, PU = Pre-ulcerative lesion F = Fissure, S = Swelling, D = Dryness Assessment Right: Left: Other Deformity: No Yes Prior Foot Ulcer: Yes Yes Prior Amputation: Yes No Charcot Joint: No  No Ambulatory Status: Ambulatory With Help Assistance Device: Cane Gait: Steady Electronic Signature(s) Signed: 03/30/2021 5:43:46 PM By: Baruch Gouty RN, BSN Entered By: Baruch Gouty on 03/30/2021 08:42:46 -------------------------------------------------------------------------------- Nutrition Risk Screening Details Patient Name: Date of Service: Oswaldo Conroy RA H A. 03/30/2021 8:15 A M Medical Record Number: [id number] Patient Account Number: [id number] Date of Birth/Sex: Treating RN: 1951-03-14 (71 y.o. [name] Primary Care Advit Trethewey: [name] Other Clinician: Referring Jakaiya Netherland: Treating Berlin Viereck/Extender: [name] Weeks in Treatment: 0 Height (in): 70 Weight (lbs): 260 Body Mass Index (BMI): 37.3 Nutrition Risk Screening Items Score Screening NUTRITION RISK SCREEN: I have an illness or condition that made me change the kind and/or amount of food I eat 0 No I eat fewer than two meals per day 0 No I eat few fruits and vegetables, or milk products 0 No I have three or more drinks of beer, liquor or wine almost every day 0 No I have tooth or mouth problems that make it hard for me to eat 0 No I don't always have enough money to buy the food I need 0 No I eat alone most of the time 1 Yes I take three or more different prescribed or over-the-counter drugs a day 1 Yes Without wanting to, I have lost or gained 10 pounds in the last six months 2 Yes I am not always physically able to shop, cook and/or feed myself 0 No Nutrition Protocols Good Risk Protocol Provide education on elevated blood Moderate Risk Protocol 0 sugars and impact on wound healing, as applicable High Risk Proctocol Risk Level: Moderate Risk Score: 4 Electronic Signature(s) Signed: 03/30/2021 5:43:46 PM By: Baruch Gouty RN, BSN Entered By: Baruch Gouty on 03/30/2021 08:40:16

## 2021-03-30 NOTE — Progress Notes (Signed)
[place], [place] ([id number]) Visit Report for 03/30/2021 Allergy List Details Patient Name: Date of Service: [name]. 03/30/2021 8:15 [name]: [id number] Patient Account Number: [id number] Date of Birth/Sex: Treating RN: [birth year] (71 y.o. [name] Primary Care Sadao Weyer: [name] Other Clinician: Referring Leslee Suire: Treating Dazia Lippold/Extender: [name] Weeks in Treatment: 0 Allergies Active Allergies penicillin Reaction: Rash Severity: Moderate Sulfa (Sulfonamide Antibiotics) Reaction: Rash Severity: Moderate Cipro Reaction: irritability amitriptyline Reaction: irritable Allergy Notes Electronic Signature(s) Signed: 03/30/2021 5:43:46 PM By: Baruch Gouty RN, BSN Entered By: Baruch Gouty on 03/30/2021 08:30:21 -------------------------------------------------------------------------------- Arrival Information Details Patient Name: Date of Service: [name], [name]. 03/30/2021 8:15 [name]: [id number] Patient Account Number: [id number] Date of Birth/Sex: Treating RN: 02-16-1951 (71 y.o. [name] Primary Care Athel Merriweather: [name] Other Clinician: Referring Usher Hedberg: Treating Hasnain Manheim/Extender: [name] in Treatment: 0 Visit Information Patient Arrived: [name] Arrival Time: 08:19 Accompanied By: self Transfer Assistance: None Patient Identification Verified: Yes Secondary Verification Process Completed: Yes Patient Requires Transmission-Based Precautions: No Patient Has Alerts: No History Since Last Visit Had [name] fall or experienced change in activities of daily living that may affect risk of falls: Yes Hospitalized since last visit: Yes Electronic Signature(s) Signed: 03/30/2021 5:43:46 PM By: Baruch Gouty RN, BSN Entered By: Baruch Gouty on 03/30/2021  08:25:46 -------------------------------------------------------------------------------- Clinic Level of Care Assessment Details Patient Name: Date of Service: [name]. 03/30/2021 8:15 [name]: [id number] Patient Account Number: [id number] Date of Birth/Sex: Treating RN: May 31, 1950 (71 y.o. [name] Primary Care Ad Guttman: [name] Other Clinician: Referring Tico Crotteau: Treating Haji Delaine/Extender: [name] in Treatment: 0 Clinic Level of Care Assessment Items TOOL 2 Quantity Score []  - 0 Use when only an EandM is performed on the INITIAL visit ASSESSMENTS - Nursing Assessment / Reassessment X- 1 20 General Physical Exam (combine w/ comprehensive assessment (listed just below) when performed on new pt. evals) X- 1 25 Comprehensive Assessment (HX, ROS, Risk Assessments, Wounds Hx, etc.) ASSESSMENTS - Wound and Skin [name] ssessment / Reassessment X - Simple Wound Assessment / Reassessment - one wound 1 5 []  - 0 Complex Wound Assessment / Reassessment - multiple wounds []  - 0 Dermatologic / Skin Assessment (not related to wound area) ASSESSMENTS - Ostomy and/or Continence Assessment and Care []  - 0 Incontinence Assessment and Management []  - 0 Ostomy Care Assessment and Management (repouching, etc.) PROCESS - Coordination of Care X - Simple Patient / Family Education for ongoing care 1 15 []  - 0 Complex (extensive) Patient / Family Education for ongoing care X- 1 10 Staff obtains Programmer, systems, Records, T Results / Process Orders est []  - 0 Staff telephones HHA, Nursing Homes / Clarify orders / etc []  - 0 Routine Transfer to another Facility (non-emergent condition) []  - 0 Routine Hospital Admission (non-emergent condition) X- 1 15 New Admissions / Biomedical engineer / Ordering NPWT Apligraf, etc. , []  - 0 Emergency Hospital Admission (emergent condition) X- 1 10 Simple Discharge Coordination []  -  0 Complex (extensive) Discharge Coordination PROCESS - Special Needs []  - 0 Pediatric / Minor Patient Management []  - 0 Isolation Patient Management []  - 0 Hearing / Language / Visual special needs []  - 0 Assessment of Community assistance (transportation, D/C planning, etc.) []  - 0 Additional assistance / Altered mentation []  - 0 Support Surface(s) Assessment (bed, cushion, seat, etc.) INTERVENTIONS - Wound Cleansing /  Measurement X- 1 5 Wound Imaging (photographs - any number of wounds) []  - 0 Wound Tracing (instead of photographs) X- 1 5 Simple Wound Measurement - one wound []  - 0 Complex Wound Measurement - multiple wounds X- 1 5 Simple Wound Cleansing - one wound []  - 0 Complex Wound Cleansing - multiple wounds INTERVENTIONS - Wound Dressings X - Small Wound Dressing one or multiple wounds 1 10 []  - 0 Medium Wound Dressing one or multiple wounds []  - 0 Large Wound Dressing one or multiple wounds []  - 0 Application of Medications - injection INTERVENTIONS - Miscellaneous []  - 0 External ear exam []  - 0 Specimen Collection (cultures, biopsies, blood, body fluids, etc.) []  - 0 Specimen(s) / Culture(s) sent or taken to Lab for analysis []  - 0 Patient Transfer (multiple staff / Civil Service fast streamer / Similar devices) []  - 0 Simple Staple / Suture removal (25 or less) []  - 0 Complex Staple / Suture removal (26 or more) []  - 0 Hypo / Hyperglycemic Management (close monitor of Blood Glucose) []  - 0 Ankle / Brachial Index (ABI) - do not check if billed separately Has the patient been seen at the hospital within the last three years: Yes Total Score: 125 Level Of Care: New/Established - Level 4 Electronic Signature(s) Signed: 03/30/2021 5:43:46 PM By: Baruch Gouty RN, BSN Entered By: Baruch Gouty on 03/30/2021 09:06:14 -------------------------------------------------------------------------------- Encounter Discharge Information Details Patient Name: Date of  Service: [name], [name]. 03/30/2021 8:15 [name]: [id number] Patient Account Number: [id number] Date of Birth/Sex: Treating RN: Sep 03, 1950 (71 y.o. [name] Primary Care Jupiter Kabir: [name] Other Clinician: Referring Zephaniah Lubrano: Treating Bryce Cheever/Extender: [name] in Treatment: 0 Encounter Discharge Information Items Discharge Condition: Stable Ambulatory Status: Cane Discharge Destination: Home Transportation: Private Auto Accompanied By: self Schedule Follow-up Appointment: Yes Clinical Summary of Care: Patient Declined Electronic Signature(s) Signed: 03/30/2021 5:43:46 PM By: Baruch Gouty RN, BSN Entered By: Baruch Gouty on 03/30/2021 09:18:00 -------------------------------------------------------------------------------- Lower Extremity Assessment Details Patient Name: Date of Service: [name]. 03/30/2021 8:15 [name]: [id number] Patient Account Number: [id number] Date of Birth/Sex: Treating RN: February 07, 1951 (71 y.o. Martyn Malay, Linda Primary Care Tyreon Frigon: [name] Other Clinician: Referring Treanna Dumler: Treating Jeneva Schweizer/Extender: [name] Weeks in Treatment: 0 Edema Assessment Assessed: [Left: No] [Right: No] Edema: [Left: Yes] [Right: Yes] Calf Left: Right: Point of Measurement: 31 cm From Medial Instep 49 cm 51 cm Ankle Left: Right: Point of Measurement: 10 cm From Medial Instep 28 cm 3.5 cm Knee To Floor Left: Right: From Medial Instep 44 cm 44 cm Vascular Assessment Pulses: Dorsalis Pedis Palpable: [Left:Yes] [Right:Yes] Electronic Signature(s) Signed: 03/30/2021 5:43:46 PM By: Baruch Gouty RN, BSN Entered By: Baruch Gouty on 03/30/2021 08:43:51 -------------------------------------------------------------------------------- Multi Wound Chart Details Patient Name: Date of Service: [name], [name]. 03/30/2021 8:15 [name]  M Medical Record Number: [id number] Patient Account Number: [id number] Date of Birth/Sex: Treating RN: 06-28-1950 (71 y.o. [name] Primary Care Zarrah Loveland: [name] Other Clinician: Referring Jack Mineau: Treating Tiaunna Buford/Extender: [name] Weeks in Treatment: 0 Vital Signs Height(in): 70 Pulse(bpm): 29 Weight(lbs): 260 Blood Pressure(mmHg): 141/82 Body Mass Index(BMI): 37 Temperature(F): 98.1 Respiratory Rate(breaths/min): 20 Photos: [3:Right, Plantar T Second] [N/[name]:N/[name] oe N/[name]] Wound Location: [3:Trauma] [N/[name]:N/[name]] Wounding Event: [3:Diabetic Wound/Ulcer of the Lower] [N/[name]:N/[name]] Primary Etiology: [3:Extremity Cataracts, Sleep Apnea, Hypertension, N/[name]] Comorbid History: [3:Type II Diabetes, Osteoarthritis, Osteomyelitis, Neuropathy, Received Radiation, Confinement Anxiety 03/26/2021] [  N/[name]:N/[name]] Date Acquired: [3:0] [N/[name]:N/[name]] Weeks of Treatment: [3:Open] [N/[name]:N/[name]] Wound Status: [3:0.3x0.2x0.1] [N/[name]:N/[name]] Measurements L x W x D (cm) [3:0.047] [N/[name]:N/[name]] [name] (cm) : rea [3:0.005] [N/[name]:N/[name]] Volume (cm) : [3:0.00%] [N/[name]:N/[name]] % Reduction in [name] rea: [3:0.00%] [N/[name]:N/[name]] % Reduction in Volume: [3:Grade 1] [N/[name]:N/[name]] Classification: [3:Medium] [N/[name]:N/[name]] Exudate [name] mount: [3:Serosanguineous] [N/[name]:N/[name]] Exudate Type: [3:red, brown] [N/[name]:N/[name]] Exudate Color: [3:Flat and Intact] [N/[name]:N/[name]] Wound Margin: [3:Large (67-100%)] [N/[name]:N/[name]] Granulation [name] mount: [3:Red] [N/[name]:N/[name]] Granulation Quality: [3:None Present (0%)] [N/[name]:N/[name]] Necrotic [name] mount: [3:Fat Layer (Subcutaneous Tissue): Yes N/[name]] Exposed Structures: [3:Fascia: No Tendon: No Muscle: No Joint: No Bone: No] Treatment Notes Wound #3 (Toe Second) Wound Laterality: Plantar, Right Cleanser Peri-Wound Care Topical Primary Dressing Promogran Prisma Matrix, 4.34 (sq in) (silver collagen) Discharge Instruction: Moisten collagen with saline or hydrogel Secondary Dressing Woven Gauze Sponges 2x2 in Discharge  Instruction: Apply over primary dressing as directed. Secured With Conforming Stretch Gauze Bandage, Sterile 2x75 (in/in) Discharge Instruction: Secure with stretch gauze as directed. Paper Tape, 1x10 (in/yd) Discharge Instruction: Secure dressing with tape as directed. Compression Wrap Compression Stockings Add-Ons Electronic Signature(s) Signed: 03/30/2021 9:29:30 AM By: [name] DO Signed: 03/30/2021 5:43:46 PM By: Baruch Gouty RN, BSN Entered By: [name] on 03/30/2021 09:22:30 -------------------------------------------------------------------------------- Multi-Disciplinary Care Plan Details Patient Name: Date of Service: [name], [name]. 03/30/2021 8:15 [name]: [id number] Patient Account Number: [id number] Date of Birth/Sex: Treating RN: 05-30-1950 (71 y.o. [name] Primary Care Renell Coaxum: [name] Other Clinician: Referring Tamara Kenyon: Treating Juliene Kirsh/Extender: [name] in Treatment: 0 Multidisciplinary Care Plan reviewed with physician Active Inactive Abuse / Safety / Falls / Self Care Management Nursing Diagnoses: History of Falls Goals: Patient/caregiver will verbalize/demonstrate measures taken to prevent injury and/or falls Date Initiated: 03/30/2021 Target Resolution Date: 04/27/2021 Goal Status: Active Interventions: Assess fall risk on admission and as needed Assess impairment of mobility on admission and as needed per policy Assess personal safety and home safety (as indicated) on admission and as needed Notes: Nutrition Nursing Diagnoses: Impaired glucose control: actual or potential Potential for alteratiion in Nutrition/Potential for imbalanced nutrition Goals: Patient/caregiver will maintain therapeutic glucose control Date Initiated: 03/30/2021 Target Resolution Date: 04/27/2021 Goal Status: Active Interventions: Assess HgA1c results as ordered upon admission and as  needed Assess patient nutrition upon admission and as needed per policy Provide education on elevated blood sugars and impact on wound healing Treatment Activities: Patient referred to Primary Care Physician for further nutritional evaluation : 03/30/2021 Notes: Wound/Skin Impairment Nursing Diagnoses: Impaired tissue integrity Knowledge deficit related to ulceration/compromised skin integrity Goals: Patient/caregiver will verbalize understanding of skin care regimen Date Initiated: 03/30/2021 Target Resolution Date: 04/27/2021 Goal Status: Active Ulcer/skin breakdown will have [name] volume reduction of 30% by week 4 Date Initiated: 03/30/2021 Target Resolution Date: 05/25/2021 Goal Status: Active Interventions: Assess patient/caregiver ability to obtain necessary supplies Assess patient/caregiver ability to perform ulcer/skin care regimen upon admission and as needed Assess ulceration(s) every visit Provide education on ulcer and skin care Treatment Activities: Skin care regimen initiated : 03/30/2021 Topical wound management initiated : 03/30/2021 Notes: Electronic Signature(s) Signed: 03/30/2021 5:43:46 PM By: Baruch Gouty RN, BSN Entered By: Baruch Gouty on 03/30/2021 09:04:41 -------------------------------------------------------------------------------- Pain Assessment Details Patient Name: Date of Service: [name], [name]. 03/30/2021 8:15 [name]: [id number] Patient Account Number: [id number] Date of Birth/Sex: Treating RN: 1950/10/08 (71 y.o. [name] Primary Care Aariya Ferrick: [name] Other Clinician: Referring Tyquez Hollibaugh: Treating Kerney Hopfensperger/Extender: [name]  White, Cynthia Weeks in Treatment: 0 Active Problems Location of Pain Severity and Description of Pain Patient Has Paino No Site Locations Rate the pain. Current Pain Level: 0 Pain Management and Medication Current Pain Management: Electronic Signature(s) Signed:  03/30/2021 5:43:46 PM By: Baruch Gouty RN, BSN Entered By: Baruch Gouty on 03/30/2021 08:47:01 -------------------------------------------------------------------------------- Patient/Caregiver Education Details Patient Name: Date of Service: [name]. 1/10/2023andnbsp8:15 [name]: [id number] Patient Account Number: [id number] Date of Birth/Gender: Treating RN: 12/06/1950 (71 y.o. [name] Primary Care Physician: [name] Other Clinician: Referring Physician: Treating Physician/Extender: [name] in Treatment: 0 Education Assessment Education Provided To: Patient Education Topics Provided Elevated Blood Sugar/ Impact on Healing: Methods: Explain/Verbal Responses: Reinforcements needed, State content correctly Wound/Skin Impairment: Methods: Explain/Verbal Responses: Reinforcements needed, State content correctly Electronic Signature(s) Signed: 03/30/2021 5:43:46 PM By: Baruch Gouty RN, BSN Entered By: Baruch Gouty on 03/30/2021 09:05:00 -------------------------------------------------------------------------------- Wound Assessment Details Patient Name: Date of Service: [name], [name]. 03/30/2021 8:15 [name]: [id number] Patient Account Number: [id number] Date of Birth/Sex: Treating RN: May 11, 1950 (71 y.o. Martyn Malay, Vaughan Basta Primary Care Vasilisa Vore: [name] Other Clinician: Referring Brendan Gruwell: Treating Treven Holtman/Extender: [name] Weeks in Treatment: 0 Wound Status Wound Number: 3 Primary Diabetic Wound/Ulcer of the Lower Extremity Etiology: Wound Location: Right, Plantar T Second oe Wound Open Wounding Event: Trauma Status: Date Acquired: 03/26/2021 Comorbid Cataracts, Sleep Apnea, Hypertension, Type II Diabetes, Weeks Of Treatment: 0 History: Osteoarthritis, Osteomyelitis, Neuropathy, Received Radiation, Clustered Wound: No  Confinement Anxiety Photos Wound Measurements Length: (cm) 0.3 Width: (cm) 0.2 Depth: (cm) 0.1 Area: (cm) 0.047 Volume: (cm) 0.005 % Reduction in Area: 0% % Reduction in Volume: 0% Tunneling: No Undermining: No Wound Description Classification: Grade 1 Wound Margin: Flat and Intact Exudate Amount: Medium Exudate Type: Serosanguineous Exudate Color: red, brown Foul Odor After Cleansing: No Slough/Fibrino No Wound Bed Granulation Amount: Large (67-100%) Exposed Structure Granulation Quality: Red Fascia Exposed: No Necrotic Amount: None Present (0%) Fat Layer (Subcutaneous Tissue) Exposed: Yes Tendon Exposed: No Muscle Exposed: No Joint Exposed: No Bone Exposed: No Treatment Notes Wound #3 (Toe Second) Wound Laterality: Plantar, Right Cleanser Peri-Wound Care Topical Primary Dressing Promogran Prisma Matrix, 4.34 (sq in) (silver collagen) Discharge Instruction: Moisten collagen with saline or hydrogel Secondary Dressing Woven Gauze Sponges 2x2 in Discharge Instruction: Apply over primary dressing as directed. Secured With Conforming Stretch Gauze Bandage, Sterile 2x75 (in/in) Discharge Instruction: Secure with stretch gauze as directed. Paper Tape, 1x10 (in/yd) Discharge Instruction: Secure dressing with tape as directed. Compression Wrap Compression Stockings Add-Ons Electronic Signature(s) Signed: 03/30/2021 5:43:46 PM By: Baruch Gouty RN, BSN Signed: 03/30/2021 5:52:30 PM By: Deon Pilling RN, BSN Entered By: Deon Pilling on 03/30/2021 08:48:07 -------------------------------------------------------------------------------- Vitals Details Patient Name: Date of Service: [name], [name]. 03/30/2021 8:15 [name]: [id number] Patient Account Number: [id number] Date of Birth/Sex: Treating RN: 02-24-1951 (71 y.o. [name] Primary Care Mats Jeanlouis: [name] Other Clinician: Referring Kylah Maresh: Treating Crystie Yanko/Extender:  [name] Weeks in Treatment: 0 Vital Signs Time Taken: 08:27 Temperature (F): 98.1 Height (in): 70 Pulse (bpm): 90 Source: Stated Respiratory Rate (breaths/min): 20 Weight (lbs): 260 Blood Pressure (mmHg): 141/82 Body Mass Index (BMI): 37.3 Reference Range: 80 - 120 mg / dl Notes pt reports not checking her blood sugars regularly Electronic Signature(s) Signed: 03/30/2021 5:43:46 PM By: Baruch Gouty RN, BSN Entered By: Baruch Gouty on 03/30/2021 08:29:18

## 2021-04-06 ENCOUNTER — Encounter (HOSPITAL_BASED_OUTPATIENT_CLINIC_OR_DEPARTMENT_OTHER): Payer: PPO | Admitting: Internal Medicine

## 2021-04-14 DIAGNOSIS — H02834 Dermatochalasis of left upper eyelid: Secondary | ICD-10-CM | POA: Diagnosis not present

## 2021-04-14 DIAGNOSIS — H2513 Age-related nuclear cataract, bilateral: Secondary | ICD-10-CM | POA: Diagnosis not present

## 2021-04-14 DIAGNOSIS — E113293 Type 2 diabetes mellitus with mild nonproliferative diabetic retinopathy without macular edema, bilateral: Secondary | ICD-10-CM | POA: Diagnosis not present

## 2021-04-14 DIAGNOSIS — H43813 Vitreous degeneration, bilateral: Secondary | ICD-10-CM | POA: Diagnosis not present

## 2021-04-16 DIAGNOSIS — E1121 Type 2 diabetes mellitus with diabetic nephropathy: Secondary | ICD-10-CM | POA: Diagnosis not present

## 2021-04-16 DIAGNOSIS — E1140 Type 2 diabetes mellitus with diabetic neuropathy, unspecified: Secondary | ICD-10-CM | POA: Diagnosis not present

## 2021-04-16 DIAGNOSIS — I129 Hypertensive chronic kidney disease with stage 1 through stage 4 chronic kidney disease, or unspecified chronic kidney disease: Secondary | ICD-10-CM | POA: Diagnosis not present

## 2021-04-16 DIAGNOSIS — E11319 Type 2 diabetes mellitus with unspecified diabetic retinopathy without macular edema: Secondary | ICD-10-CM | POA: Diagnosis not present

## 2021-04-16 DIAGNOSIS — E785 Hyperlipidemia, unspecified: Secondary | ICD-10-CM | POA: Diagnosis not present

## 2021-04-16 DIAGNOSIS — E1142 Type 2 diabetes mellitus with diabetic polyneuropathy: Secondary | ICD-10-CM | POA: Diagnosis not present

## 2021-04-20 ENCOUNTER — Encounter: Payer: Self-pay | Admitting: Podiatry

## 2021-04-20 ENCOUNTER — Other Ambulatory Visit: Payer: Self-pay

## 2021-04-20 ENCOUNTER — Ambulatory Visit: Payer: PPO | Admitting: Podiatry

## 2021-04-20 DIAGNOSIS — M79675 Pain in left toe(s): Secondary | ICD-10-CM | POA: Diagnosis not present

## 2021-04-20 DIAGNOSIS — L97522 Non-pressure chronic ulcer of other part of left foot with fat layer exposed: Secondary | ICD-10-CM

## 2021-04-20 DIAGNOSIS — B351 Tinea unguium: Secondary | ICD-10-CM

## 2021-04-20 DIAGNOSIS — M79674 Pain in right toe(s): Secondary | ICD-10-CM

## 2021-04-21 DIAGNOSIS — L97522 Non-pressure chronic ulcer of other part of left foot with fat layer exposed: Secondary | ICD-10-CM | POA: Diagnosis not present

## 2021-04-26 NOTE — Progress Notes (Signed)
Subjective: 71 year old female presents the office today for follow-up evaluation of a wound on her left foot.  She states that the wound has opened back up and she has noticed some mild bleeding to the left foot along the area of the ulcer.  No increase in redness or red streaks.  Also has some nails be trimmed today as elongated causing discomfort.  Also states that she had a wound on the right second toe which she was at urgent care for this.  This is since healed and she was recommended to follow-up with wound care but she did not go.  She denies any fevers or chills at this time she has no other concerns.   Objective: AAO x3, NAD Left foot submetatarsal 1 is a hyperkeratotic lesion.  Upon debridement there is a granular wound today measures 1.3 x 0.3 x 0.2 cm without any probing, undermining or tunneling.  There is no fluctuation or crepitation.  Mild skin edema without any AC cellulitis.  Prior to wound debridement the wound was much smaller as well as almost completely covered with callus measuring 0.4 x 0.2 x 0.1 cm. Preulcerative area noted on the right second toe.  No edema or erythema. The nails on the left 1, 2, 4, 5 and right second, third, fourth nails are hypertrophic, dystrophic with yellow-brown discoloration.  No edema, erythema incisional sites. On the right second toe is a preulcerative area from the laceration.  There are some dried blood which was able to debride today with any complications. Significant bunion is present. No pain with calf compression, swelling, warmth, erythema    Assessment: 71 year old female left foot recurrent ulceration, symptomatic onychomycosis; right second toe preulcerative lesion  Plan: -All treatment options discussed with the patient including all alternatives, risks, complications.  -Shepherd debrided the wound today utilizing a #312 with scalpel any complications to healthy, bleeding, granular tissue in order to promote wound healing.  Was  able to debride nonviable, devitalized tissue.  There was minimal blood loss and hemostasis achieved.  Manual compression.  Pre and post wound measurements are above.  I cleansed the ulcer with saline and Medihoney was applied followed by dressing.  Recommend continue with daily dressing changes at home.  Continue offloading. -Sharply to the nails x7 without any complications or bleeding. -Sharply debrded the preulcerative lesion right plantar complications.  Return in about 2 weeks (around 05/04/2021) for ulcer left foot. X-ray next appointment  Trula Slade DPM

## 2021-05-03 ENCOUNTER — Ambulatory Visit (INDEPENDENT_AMBULATORY_CARE_PROVIDER_SITE_OTHER): Payer: PPO

## 2021-05-03 ENCOUNTER — Other Ambulatory Visit: Payer: Self-pay

## 2021-05-03 ENCOUNTER — Ambulatory Visit: Payer: PPO | Admitting: Podiatry

## 2021-05-03 DIAGNOSIS — L97522 Non-pressure chronic ulcer of other part of left foot with fat layer exposed: Secondary | ICD-10-CM

## 2021-05-04 ENCOUNTER — Encounter (HOSPITAL_COMMUNITY): Payer: Self-pay

## 2021-05-04 DIAGNOSIS — Z794 Long term (current) use of insulin: Secondary | ICD-10-CM | POA: Diagnosis not present

## 2021-05-04 DIAGNOSIS — Z89411 Acquired absence of right great toe: Secondary | ICD-10-CM | POA: Diagnosis not present

## 2021-05-04 DIAGNOSIS — E1165 Type 2 diabetes mellitus with hyperglycemia: Secondary | ICD-10-CM | POA: Diagnosis not present

## 2021-05-04 DIAGNOSIS — D692 Other nonthrombocytopenic purpura: Secondary | ICD-10-CM | POA: Diagnosis not present

## 2021-05-04 DIAGNOSIS — E1142 Type 2 diabetes mellitus with diabetic polyneuropathy: Secondary | ICD-10-CM | POA: Diagnosis not present

## 2021-05-09 NOTE — Progress Notes (Signed)
Subjective: 71 year old female presents the office today for follow-up evaluation of a wound on her left foot.  She states that she has noticed some bleeding coming from the wound and has been "crusty".  Denies any purulence or increasing swelling or redness.  She denies any fevers or chills.  No chest pain or shortness of breath.  No other concerns.   Objective: AAO x3, NAD Ulceration of the plantar aspect left foot submetatarsal 1 with hyperkeratotic periwound.  Centralized granular tissue is noted.  There is no probing, undermining or tunneling.  There is no surrounding erythema, ascending cellulitis.  No fluctuance or crepitation.  There is no malodor.  Wound measures 1.8 x 0.3 x 0.1 cm. Significant bunion is present. No pain with calf compression, swelling, warmth, erythema       Assessment: 71 year old female left foot recurrent ulceration  Plan: -All treatment options discussed with the patient including all alternatives, risks, complications.  -X-rays obtained reviewed.  No evidence of acute fracture, osteomyelitis.  Severe bunions present. -Sharply debrided the wound today utilizing a #312 with scalpel any complications to healthy, bleeding, granular tissue in order to promote wound healing.  Was able to debride nonviable, devitalized tissue.  There was minimal blood loss and hemostasis achieved.  Manual compression.  Pre and post wound measurements are above.  I cleansed the ulcer with saline and Medihoney was applied followed by dressing.  Recommend continue with daily dressing changes at home.  Continue offloading.  Return in about 2 weeks (around 05/17/2021) for ulcer.  Trula Slade DPM

## 2021-05-11 DIAGNOSIS — I129 Hypertensive chronic kidney disease with stage 1 through stage 4 chronic kidney disease, or unspecified chronic kidney disease: Secondary | ICD-10-CM | POA: Diagnosis not present

## 2021-05-11 DIAGNOSIS — Z7984 Long term (current) use of oral hypoglycemic drugs: Secondary | ICD-10-CM | POA: Diagnosis not present

## 2021-05-11 DIAGNOSIS — E1142 Type 2 diabetes mellitus with diabetic polyneuropathy: Secondary | ICD-10-CM | POA: Diagnosis not present

## 2021-05-11 DIAGNOSIS — N183 Chronic kidney disease, stage 3 unspecified: Secondary | ICD-10-CM | POA: Diagnosis not present

## 2021-05-13 DIAGNOSIS — Z23 Encounter for immunization: Secondary | ICD-10-CM | POA: Diagnosis not present

## 2021-05-13 DIAGNOSIS — E559 Vitamin D deficiency, unspecified: Secondary | ICD-10-CM | POA: Diagnosis not present

## 2021-05-13 DIAGNOSIS — Z0000 Encounter for general adult medical examination without abnormal findings: Secondary | ICD-10-CM | POA: Diagnosis not present

## 2021-05-13 DIAGNOSIS — N183 Chronic kidney disease, stage 3 unspecified: Secondary | ICD-10-CM | POA: Diagnosis not present

## 2021-05-13 DIAGNOSIS — Z7984 Long term (current) use of oral hypoglycemic drugs: Secondary | ICD-10-CM | POA: Diagnosis not present

## 2021-05-13 DIAGNOSIS — F339 Major depressive disorder, recurrent, unspecified: Secondary | ICD-10-CM | POA: Diagnosis not present

## 2021-05-13 DIAGNOSIS — E1140 Type 2 diabetes mellitus with diabetic neuropathy, unspecified: Secondary | ICD-10-CM | POA: Diagnosis not present

## 2021-05-13 DIAGNOSIS — R198 Other specified symptoms and signs involving the digestive system and abdomen: Secondary | ICD-10-CM | POA: Diagnosis not present

## 2021-05-13 DIAGNOSIS — I129 Hypertensive chronic kidney disease with stage 1 through stage 4 chronic kidney disease, or unspecified chronic kidney disease: Secondary | ICD-10-CM | POA: Diagnosis not present

## 2021-05-13 DIAGNOSIS — C50912 Malignant neoplasm of unspecified site of left female breast: Secondary | ICD-10-CM | POA: Diagnosis not present

## 2021-05-13 DIAGNOSIS — Z89411 Acquired absence of right great toe: Secondary | ICD-10-CM | POA: Diagnosis not present

## 2021-05-13 DIAGNOSIS — E785 Hyperlipidemia, unspecified: Secondary | ICD-10-CM | POA: Diagnosis not present

## 2021-05-14 ENCOUNTER — Other Ambulatory Visit: Payer: Self-pay | Admitting: Family Medicine

## 2021-05-14 DIAGNOSIS — R198 Other specified symptoms and signs involving the digestive system and abdomen: Secondary | ICD-10-CM

## 2021-05-17 ENCOUNTER — Other Ambulatory Visit: Payer: Self-pay | Admitting: Hematology and Oncology

## 2021-05-17 ENCOUNTER — Ambulatory Visit: Payer: PPO | Admitting: Podiatry

## 2021-05-17 DIAGNOSIS — E785 Hyperlipidemia, unspecified: Secondary | ICD-10-CM | POA: Diagnosis not present

## 2021-05-17 DIAGNOSIS — I129 Hypertensive chronic kidney disease with stage 1 through stage 4 chronic kidney disease, or unspecified chronic kidney disease: Secondary | ICD-10-CM | POA: Diagnosis not present

## 2021-05-17 DIAGNOSIS — N183 Chronic kidney disease, stage 3 unspecified: Secondary | ICD-10-CM | POA: Diagnosis not present

## 2021-05-17 DIAGNOSIS — H2513 Age-related nuclear cataract, bilateral: Secondary | ICD-10-CM | POA: Diagnosis not present

## 2021-05-17 DIAGNOSIS — E1142 Type 2 diabetes mellitus with diabetic polyneuropathy: Secondary | ICD-10-CM | POA: Diagnosis not present

## 2021-05-17 DIAGNOSIS — F3341 Major depressive disorder, recurrent, in partial remission: Secondary | ICD-10-CM | POA: Diagnosis not present

## 2021-05-17 DIAGNOSIS — H2511 Age-related nuclear cataract, right eye: Secondary | ICD-10-CM | POA: Diagnosis not present

## 2021-05-18 ENCOUNTER — Ambulatory Visit: Payer: PPO | Admitting: Podiatry

## 2021-05-18 ENCOUNTER — Telehealth: Payer: Self-pay | Admitting: Hematology and Oncology

## 2021-05-18 ENCOUNTER — Other Ambulatory Visit: Payer: Self-pay

## 2021-05-18 DIAGNOSIS — L97522 Non-pressure chronic ulcer of other part of left foot with fat layer exposed: Secondary | ICD-10-CM

## 2021-05-18 DIAGNOSIS — I129 Hypertensive chronic kidney disease with stage 1 through stage 4 chronic kidney disease, or unspecified chronic kidney disease: Secondary | ICD-10-CM | POA: Diagnosis not present

## 2021-05-18 NOTE — Telephone Encounter (Signed)
.  Called patient to schedule appointment per 2/27 inbasket, patient is aware of date and time.  ° °

## 2021-05-19 NOTE — Progress Notes (Signed)
Subjective: 71 year old female presents the office today for follow-up evaluation of a wound on her left foot.  She states that it has been dry.  Denies any drainage or bleeding.  No increase in swelling or redness.  She does have neuropathy and does not have any discomfort.  No fevers or chills.  No other concerns.  Objective: AAO x3, NAD Ulceration of the plantar aspect left foot submetatarsal 1 with hyperkeratotic periwound.  Hyperkeratotic tissue noted upon debridement there still is a wound that measures 1.5 x 0.2 x 0.1 cm.  There is no probing, undermining or tunneling.  No surrounding erythema, ascending cellulitis.  No fluctuation or crepitation.  There is no malodor. Significant bunion is present. No pain with calf compression, swelling, warmth, erythema     Assessment: 71 year old female left foot recurrent ulceration  Plan: -All treatment options discussed with the patient including all alternatives, risks, complications.  -Sharply debrided the hyperkeratotic lesion, ulcer without any complications utilizing #786 with scalpel.  Prior to wound debridement is difficult to measure the wound as it was almost covered completely with callus.  Picture above is prior to debridement.  There is no significant blood loss.  I cleansed the wound with saline and hemostasis achieved.  No significant blood loss.  Medihoney was applied followed by dressing. -Offlaoding  -Monitor for any clinical signs or symptoms of infection and directed to call the office immediately should any occur or go to the ER.  Return in about 3 weeks (around 06/08/2021) for ulcer foot, left.  Trula Slade DPM

## 2021-05-27 ENCOUNTER — Telehealth: Payer: Self-pay

## 2021-05-27 NOTE — Telephone Encounter (Signed)
Casts Sent to [employer] - HOLD for CMN and HTA Auth ?

## 2021-06-01 DIAGNOSIS — N183 Chronic kidney disease, stage 3 unspecified: Secondary | ICD-10-CM | POA: Diagnosis not present

## 2021-06-01 DIAGNOSIS — E785 Hyperlipidemia, unspecified: Secondary | ICD-10-CM | POA: Diagnosis not present

## 2021-06-01 DIAGNOSIS — H2511 Age-related nuclear cataract, right eye: Secondary | ICD-10-CM | POA: Diagnosis not present

## 2021-06-01 DIAGNOSIS — E1121 Type 2 diabetes mellitus with diabetic nephropathy: Secondary | ICD-10-CM | POA: Diagnosis not present

## 2021-06-07 DIAGNOSIS — H2512 Age-related nuclear cataract, left eye: Secondary | ICD-10-CM | POA: Diagnosis not present

## 2021-06-08 ENCOUNTER — Ambulatory Visit (INDEPENDENT_AMBULATORY_CARE_PROVIDER_SITE_OTHER): Payer: PPO

## 2021-06-08 ENCOUNTER — Ambulatory Visit: Payer: PPO | Admitting: Podiatry

## 2021-06-08 ENCOUNTER — Other Ambulatory Visit: Payer: Self-pay

## 2021-06-08 DIAGNOSIS — L03032 Cellulitis of left toe: Secondary | ICD-10-CM

## 2021-06-08 DIAGNOSIS — L02612 Cutaneous abscess of left foot: Secondary | ICD-10-CM | POA: Diagnosis not present

## 2021-06-08 DIAGNOSIS — L97522 Non-pressure chronic ulcer of other part of left foot with fat layer exposed: Secondary | ICD-10-CM | POA: Diagnosis not present

## 2021-06-08 MED ORDER — DOXYCYCLINE HYCLATE 100 MG PO CAPS: 100.0000 mg | ORAL_CAPSULE | Freq: Two times a day (BID) | ORAL | 0 refills | Status: DC

## 2021-06-11 ENCOUNTER — Other Ambulatory Visit: Payer: PPO

## 2021-06-11 NOTE — Progress Notes (Signed)
Subjective: ?71 year old female presents the office today for follow-up evaluation of a wound on her left foot.  She states that she has been wearing her crocs more she has noticed that the wound has opened up and had some bleeding.  No pus.  No previous swelling or redness was reports.  She is continue with daily dressing changes.  She denies any fevers or chills.  ? ?Objective: ?AAO x3, NAD ?Ulceration of the plantar aspect left foot submetatarsal 1 with hyperkeratotic periwound.  Hyperkeratotic tissue noted however after debridement there still is a wound is still present measuring 1 x 0.4 x 0.1 cm.  There is mild surrounding erythema without any ascending cellulitis.  There is no fluctuation or crepitation.  There is no malodor.  ?Significant bunion is present. ?No pain with calf compression, swelling, warmth, erythema ? ? ? ? ? ?Assessment: ?71 year old female left foot recurrent ulceration ? ?Plan: ?-All treatment options discussed with the patient including all alternatives, risks, complications.  ?-X-rays obtained reviewed.  No subacute fracture.  Significant bunion is present on the obvious cortical destruction suggestive of osteomyelitis. ?-Sharply debrided the hyperkeratotic lesion, ulcer without any complications utilizing #322 with scalpel.  Prior to wound debridement unable to measure the wound as it was almost covered completely with callus.  Picture above is after debridement.  There is no significant blood loss.  I cleansed the wound with saline and hemostasis achieved.  No significant blood loss.  Medihoney was applied followed by dressing. ?-Given erythema prescribed doxycycline. ?-Offlaoding  ?-Monitor for any clinical signs or symptoms of infection and directed to call the office immediately should any occur or go to the ER. ? ?Return in about 1 week (around 06/15/2021). ? ?Trula Slade DPM ? ?

## 2021-06-17 ENCOUNTER — Ambulatory Visit: Payer: PPO | Admitting: Podiatry

## 2021-06-17 DIAGNOSIS — L97522 Non-pressure chronic ulcer of other part of left foot with fat layer exposed: Secondary | ICD-10-CM

## 2021-06-21 NOTE — Progress Notes (Signed)
Subjective: ?71 year old female presents the office today for follow-up evaluation of a wound on her left foot.  She is continue daily dressing changes applying Medihoney to the wound.  She is noted.  Minimal drainage is more bloody drainage.  There is no purulence.  No present swelling or redness.  She has no pain.  Overall she states the wound is doing better.  Denies any fevers or chills.  No other concerns today.  ? ?Objective: ?AAO x3, NAD ?Ulceration of the plantar aspect left foot submetatarsal 1 with hyperkeratotic periwound.  Hyperkeratotic tissue noted however after debridement there still is a wound is still present measuring 0.7 x 0.2 x 0.1 cm.  There is mild surrounding erythema without any ascending cellulitis.  There is no fluctuation or crepitation.  There is no malodor.  ?Significant bunion is present. ?No pain with calf compression, swelling, warmth, erythema ? ? ? ? ? ? ?Assessment: ?71 year old female left foot recurrent ulceration ? ?Plan: ?-All treatment options discussed with the patient including all alternatives, risks, complications.  ?-Sharply debrided the hyperkeratotic lesion, ulcer without any complications utilizing #893 with scalpel.  Prior to wound debridement unable to measure the wound as it was almost covered completely with callus.  Picture above is after debridement.  There is no significant blood loss.  I cleansed the wound with saline and hemostasis achieved.  No significant blood loss.  Medihoney was applied followed by dressing. ?-Offlaoding  ?-Monitor for any clinical signs or symptoms of infection and directed to call the office immediately should any occur or go to the ER. ? ?Return in about 2 weeks (around 07/01/2021) for ulcer. ? ?Trula Slade DPM ? ?

## 2021-06-29 DIAGNOSIS — H2512 Age-related nuclear cataract, left eye: Secondary | ICD-10-CM | POA: Diagnosis not present

## 2021-06-29 NOTE — Progress Notes (Signed)
? ?Patient Care Team: ?[name], MD as PCP - General (Family Medicine) ?Pixie Casino, MD as PCP - Cardiology (Cardiology) ?Cameron Sprang, MD as Consulting Physician (Neurology) ?Nicholas Lose, MD as Consulting Physician (Hematology and Oncology) ?Eppie Gibson, MD as Attending Physician (Radiation Oncology) ?Coralie Keens, MD as Consulting Physician (General Surgery) ? ?DIAGNOSIS:  ?Encounter Diagnosis  ?Name Primary?  ? Ductal carcinoma in situ (DCIS) of left breast   ? ? ?SUMMARY OF ONCOLOGIC HISTORY: ?Oncology History  ?Ductal carcinoma in situ (DCIS) of left breast  ?01/02/2020 Initial Diagnosis  ? Screening mammogram showed two groups of calcifications in the inferior medial quadrant of the left breast, 0.5cm and 0.9cm. Biopsy showed DCIS, grade 3, ER/PR negative.  ?  ?01/08/2020 Cancer Staging  ? Staging form: Breast, AJCC 8th Edition ?- Clinical stage from 01/08/2020: Stage 0 (cTis (DCIS), cN0, cM0, ER-, PR-, HER2: Not Assessed) ?  ?02/25/2020 Surgery  ? Left lumpectomy Ninfa Linden) [phone]): high grade DCIS, 1.4cm, clear margins. No regional lymph nodes were examined. ?  ?03/31/2020 - 04/29/2020 Radiation Therapy  ? The patient initially received a dose of 40.05 Gy in 15 fractions to the breast using whole-breast tangent fields. This was delivered using a 3-D conformal technique. The pt received a boost delivering an additional 10 Gy in 5 fractions using a electron boost with 42meV electrons. The total dose was 50.05 Gy. ?  ?04/2020 - 04/2025 Anti-estrogen oral therapy  ? Tamoxifen ?  ? ? ?CHIEF COMPLIANT: Follow-up of left breast DCIS on Tamoxifen ? ?INTERVAL HISTORY: [name] is a ?71 y.o. female with above-mentioned history of left breast DCIS. She presents to the clinic today for a follow-up. She denies no symptoms just feel some depression. Complains of neuropathy. She states since Covid she has been a home body. ? ?ALLERGIES:  is allergic to amitriptyline, ciprofibrate,  ciprofloxacin, pristiq [desvenlafaxine], penicillins, sulfa antibiotics, and sulfamethoxazole. ? ?MEDICATIONS:  ?Current Outpatient Medications  ?Medication Sig Dispense Refill  ? buPROPion (WELLBUTRIN XL) 300 MG 24 hr tablet Take 300 mg by mouth daily.    ? carvedilol (COREG) 6.25 MG tablet Take 6.25 mg by mouth 2 (two) times daily.    ? doxycycline (VIBRAMYCIN) 100 MG capsule Take 1 capsule (100 mg total) by mouth 2 (two) times daily. 20 capsule 0  ? escitalopram (LEXAPRO) 10 MG tablet Take 10 mg by mouth daily.    ? escitalopram (LEXAPRO) 20 MG tablet Take 20 mg by mouth daily.    ? furosemide (LASIX) 40 MG tablet Take 40 mg by mouth daily as needed.    ? insulin NPH-regular Human (NOVOLIN 70/30) (70-30) 100 UNIT/ML injection Inject into the skin. 24 ml in the morning and 20 ml in the evening    ? metFORMIN (GLUCOPHAGE-XR) 500 MG 24 hr tablet Take 1,000 mg by mouth in the morning and at bedtime. PT TAKES 2000 MG DAILY    ? MONOJECT INSULIN SYRINGE 31G X 5/16" 1 ML MISC See admin instructions.    ? mupirocin ointment (BACTROBAN) 2 % Apply 1 application topically daily.    ? nystatin cream (MYCOSTATIN) 1 APPLICATION TWICE A DAY EXTERNALLY    ? ONETOUCH VERIO test strip     ? pravastatin (PRAVACHOL) 40 MG tablet Take 1 tablet once a day for cholesterol    ? FLUoxetine (PROZAC) 40 MG capsule Take 40 mg by mouth daily.   0  ? tamoxifen (NOLVADEX) 20 MG tablet Take 1 tablet (20 mg total) by mouth every morning.  90 tablet 4  ? ?No current facility-administered medications for this visit.  ? ? ?PHYSICAL EXAMINATION: ?ECOG PERFORMANCE STATUS: 1 - Symptomatic but completely ambulatory ? ?Vitals:  ? 07/13/21 1344  ?BP: (!) 108/58  ?Pulse: 96  ?Resp: 18  ?Temp: 98.1 ?F (36.7 ?C)  ?SpO2: 98%  ? ?Filed Weights  ? 07/13/21 1344  ?Weight: 254 lb 9.6 oz (115.5 kg)  ? ? ?BREAST: No palpable masses or nodules in either right or left breasts. No palpable axillary supraclavicular or infraclavicular adenopathy no breast tenderness or  nipple discharge. (exam performed in the presence of a chaperone) ? ?LABORATORY DATA:  ?I have reviewed the data as listed ? ?  Latest Ref Rng & Units 02/21/2020  ?  3:45 PM 01/08/2020  ? 10:51 AM 01/03/2017  ?  4:12 PM  ?CMP  ?Glucose 70 - 99 mg/dL 154   180   89    ?BUN 8 - 23 mg/dL $Remove'26   24   21    'VzcLHDb$ ?Creatinine 0.44 - 1.00 mg/dL 1.09   1.25   0.87    ?Sodium 135 - 145 mmol/L 137   136   136    ?Potassium 3.5 - 5.1 mmol/L 4.8   4.6   4.4    ?Chloride 98 - 111 mmol/L 101   103   106    ?CO2 22 - 32 mmol/L $RemoveB'25   26   23    'GtnMRaRC$ ?Calcium 8.9 - 10.3 mg/dL 9.9   10.0   9.4    ?Total Protein 6.5 - 8.1 g/dL  7.6   7.1    ?Total Bilirubin 0.3 - 1.2 mg/dL  0.3   0.6    ?Alkaline Phos 38 - 126 U/L  56     ?AST 15 - 41 U/L  18   17    ?ALT 0 - 44 U/L  20   13    ? ? ?Lab Results  ?Component Value Date  ? WBC 9.0 01/08/2020  ? HGB 12.1 01/08/2020  ? HCT 37.5 01/08/2020  ? MCV 100.0 01/08/2020  ? PLT 280 01/08/2020  ? NEUTROABS 5.1 01/08/2020  ? ? ?ASSESSMENT & PLAN:  ?Ductal carcinoma in situ (DCIS) of left breast ?01/02/2020: Screening mammogram showed two groups of calcifications in the inferior medial quadrant of the left breast, 0.5cm and 0.9cm. Biopsy showed DCIS, grade 3, ER/PR negative.  ?02/25/20: Left lumpectomy Ninfa Linden): high grade DCIS, 1.4cm, clear margins ?04/02/19-04/29/20: XRT ?  ?Tamoxifen toxicities: Denies any adverse effects with tamoxifen ? ?Apathy: Patient has been alone since COVID and does not feel like she wants to mingle with people.  Both her sisters are assisting her into a simulated more with society again. ? ?Patient had prior hysterectomy ? ?Breast cancer surveillance: ?1.  Breast exam 07/13/2021: Benign ?2. mammogram: At [facility] October 2022: Benign ? ?Return to clinic in 1 year for follow-up ? ? ? ?No orders of the defined types were placed in this encounter. ? ?The patient has a good understanding of the overall plan. she agrees with it. she will call with any problems that may develop before the next visit  here. ?Total time spent: 30 mins including face to face time and time spent for planning, charting and co-ordination of care ? ? Harriette Ohara, MD ?07/13/21 ? ? ? I Gardiner Coins am scribing for Dr. Lindi Adie ? ?I have reviewed the above documentation for accuracy and completeness, and I agree with the above. ?. ?

## 2021-07-02 ENCOUNTER — Ambulatory Visit: Payer: PPO | Admitting: Podiatry

## 2021-07-02 DIAGNOSIS — L97522 Non-pressure chronic ulcer of other part of left foot with fat layer exposed: Secondary | ICD-10-CM

## 2021-07-05 NOTE — Progress Notes (Signed)
Subjective: ?71 year old female presents the office today for follow-up evaluation of a wound on her left foot.  She has not seen any drainage or pus coming from the area but only occasional small amount of bleeding.  No increase in swelling or redness.  She has no pain associate with this.  She denies any fevers or chills.  No new concerns today.   ? ?Objective: ?AAO x3, NAD ?Ulceration of the plantar aspect left foot submetatarsal 1 with hyperkeratotic periwound.  Hyperkeratotic tissue noted however after debridement there still is a wound is still present measuring 0.5 x 0.2 x 0.1 cm after debridement.  Prior to debridement is somewhat smaller at 0.4 x 0.2 x 0.1 cm.  There is mild surrounding erythema without any ascending cellulitis.  There is no fluctuation or crepitation.  There is no malodor.  ?Significant bunion is present. ?No pain with calf compression, swelling, warmth, erythema ? ? ? ? ?Assessment: ?71 year old female left foot recurrent ulceration ? ?Plan: ?-All treatment options discussed with the patient including all alternatives, risks, complications.  ?-Sharply debrided the hyperkeratotic lesion, ulcer without any complications utilizing #834 with scalpel.  This was debrided down to healthy, granular tissue.  There is no significant blood loss.  I cleansed the wound with saline and hemostasis achieved.  No significant blood loss.  Medihoney was applied followed by dressing. ?-Offlaoding  ?-Monitor for any clinical signs or symptoms of infection and directed to call the office immediately should any occur or go to the ER. ? ?Return in about 3 weeks (around 07/23/2021). ? ?Trula Slade DPM ? ?

## 2021-07-13 ENCOUNTER — Inpatient Hospital Stay: Payer: PPO | Attending: Hematology and Oncology | Admitting: Hematology and Oncology

## 2021-07-13 ENCOUNTER — Other Ambulatory Visit: Payer: Self-pay

## 2021-07-13 DIAGNOSIS — Z79899 Other long term (current) drug therapy: Secondary | ICD-10-CM | POA: Insufficient documentation

## 2021-07-13 DIAGNOSIS — D0512 Intraductal carcinoma in situ of left breast: Secondary | ICD-10-CM | POA: Diagnosis not present

## 2021-07-13 MED ORDER — TAMOXIFEN CITRATE 20 MG PO TABS: 20.0000 mg | ORAL_TABLET | Freq: Every morning | ORAL | 4 refills | Status: DC

## 2021-07-13 NOTE — Assessment & Plan Note (Addendum)
01/02/2020:?Screening mammogram showed two groups of calcifications in the inferior medial quadrant of the left breast, 0.5cm and 0.9cm. Biopsy showed DCIS, grade 3, ER/PR negative.? ?02/25/20: Left lumpectomy Ninfa Linden): high grade DCIS, 1.4cm, clear margins ?04/02/19-04/29/20: XRT ?? ?Tamoxifen toxicities: Denies any adverse effects with tamoxifen ? ?Apathy: Patient has been alone since COVID and does not feel like she wants to mingle with people.  Both her sisters are assisting her into a simulated more with society again. ? ?Patient had prior hysterectomy ? ?Breast cancer surveillance: ?1.  Breast exam 07/13/2021: Benign ?2. mammogram: At [facility] October 2022: Benign ? ?Return to clinic in 1 year for follow-up ?

## 2021-07-14 ENCOUNTER — Telehealth: Payer: Self-pay | Admitting: Hematology and Oncology

## 2021-07-14 NOTE — Telephone Encounter (Signed)
Scheduled appointment per 4/25 los. Patient is aware. ?

## 2021-07-16 DIAGNOSIS — E785 Hyperlipidemia, unspecified: Secondary | ICD-10-CM | POA: Diagnosis not present

## 2021-07-16 DIAGNOSIS — I129 Hypertensive chronic kidney disease with stage 1 through stage 4 chronic kidney disease, or unspecified chronic kidney disease: Secondary | ICD-10-CM | POA: Diagnosis not present

## 2021-07-16 DIAGNOSIS — F339 Major depressive disorder, recurrent, unspecified: Secondary | ICD-10-CM | POA: Diagnosis not present

## 2021-07-16 DIAGNOSIS — E1142 Type 2 diabetes mellitus with diabetic polyneuropathy: Secondary | ICD-10-CM | POA: Diagnosis not present

## 2021-07-23 ENCOUNTER — Ambulatory Visit: Payer: PPO | Admitting: Podiatry

## 2021-07-23 DIAGNOSIS — L97522 Non-pressure chronic ulcer of other part of left foot with fat layer exposed: Secondary | ICD-10-CM | POA: Diagnosis not present

## 2021-07-27 NOTE — Progress Notes (Signed)
Subjective: ?71 year old female presents the office today for follow-up evaluation of a wound on her left foot.  She has not seen any drainage or pus coming from the area but only occasional small amount of bleeding.  No increase in swelling or r she said that she did not the same.  She does continue her crocs on the house and not taking pressure off of the foot.  She has not seen any drainage or pus or any swelling or redness.  She change the bandage every other day.  No fevers or chills that she reports no other concerns.   ? ?Objective: ?AAO x3, NAD ?Ulceration of the plantar aspect left foot submetatarsal 1 with hyperkeratotic periwound.  Hyperkeratotic tissue noted however after debridement there still is a wound is still present measuring about the same in size 0.5 x 0.2 x 0.1 cm after debridement.  Prior to debridement is was much smaller and almost closed covered with callus.  There is mild surrounding erythema without any ascending cellulitis.  There is no fluctuation or crepitation.  There is no malodor.  ?Significant bunion is present. ?No pain with calf compression, swelling, warmth, erythema ? ?Assessment: ?71 year old female left foot recurrent ulceration ? ?Plan: ?-All treatment options discussed with the patient including all alternatives, risks, complications.  ?-Sharply debrided the hyperkeratotic lesion, ulcer without any complications utilizing #423 with scalpel.  This was debrided down to healthy, granular tissue through the nonviable devitalized tissue.  There is no significant blood loss.  I cleansed the wound with saline and hemostasis achieved.  No significant blood loss.  Medihoney was applied followed by dressing. ?-Offlaoding  ?-Monitor for any clinical signs or symptoms of infection and directed to call the office immediately should any occur or go to the ER. ? ?Trula Slade DPM ? ?

## 2021-07-30 DIAGNOSIS — I10 Essential (primary) hypertension: Secondary | ICD-10-CM | POA: Diagnosis not present

## 2021-07-30 DIAGNOSIS — E785 Hyperlipidemia, unspecified: Secondary | ICD-10-CM | POA: Diagnosis not present

## 2021-07-30 DIAGNOSIS — E1121 Type 2 diabetes mellitus with diabetic nephropathy: Secondary | ICD-10-CM | POA: Diagnosis not present

## 2021-07-30 DIAGNOSIS — F3341 Major depressive disorder, recurrent, in partial remission: Secondary | ICD-10-CM | POA: Diagnosis not present

## 2021-08-10 ENCOUNTER — Ambulatory Visit: Payer: PPO | Admitting: Podiatry

## 2021-08-10 DIAGNOSIS — L97522 Non-pressure chronic ulcer of other part of left foot with fat layer exposed: Secondary | ICD-10-CM

## 2021-08-10 DIAGNOSIS — M79674 Pain in right toe(s): Secondary | ICD-10-CM | POA: Diagnosis not present

## 2021-08-10 DIAGNOSIS — B351 Tinea unguium: Secondary | ICD-10-CM | POA: Diagnosis not present

## 2021-08-10 DIAGNOSIS — M79675 Pain in left toe(s): Secondary | ICD-10-CM

## 2021-08-10 DIAGNOSIS — E1142 Type 2 diabetes mellitus with diabetic polyneuropathy: Secondary | ICD-10-CM

## 2021-08-18 NOTE — Progress Notes (Signed)
Subjective: 71 year old female presents the office today for follow-up evaluation of a wound on her left foot.  She states that she is done about the same.  She has some occasional bleeding.  She is contrast of her foot but she still walks and does not wear supportive shoes all the time.  No purulence that she reports.  No increase in swelling or redness.  She has not any pain although she has neuropathy.  Asking for the nails be trimmed there is a thickened elongated she cannot do them herself causing discomfort.   Objective: AAO x3, NAD Ulceration of the plantar aspect left foot submetatarsal 1 with hyperkeratotic periwound.  Hyperkeratotic tissue noted however after debridement there still is a wound is still present measuring about the same in size 0.5 x 0.3 x 0.1 cm after debridement.  Prior to debridement was slightly smaller due to the hyperkeratotic periwound measuring 0.4 x 0.2 x 0.1 cm.  There is no probing, undermining or tunneling.  No fluctuation or crepitation. Significant bunion is present. Nails are hypertrophic, dystrophic, elongated.  The nails affected are left 1, 2, 4, 5 in the right 2, 3, 4, 5.  No edema, erythema or signs of infection. No pain with calf compression, swelling, warmth, erythema  Assessment: 71 year old female left foot recurrent ulceration  Plan: -All treatment options discussed with the patient including all alternatives, risks, complications.  -Sharply debrided the hyperkeratotic lesion, ulcer without any complications utilizing #371 with scalpel.  This was debrided down to healthy, granular tissue through the nonviable devitalized tissue.  There is no significant blood loss.  I cleansed the wound with saline and hemostasis achieved.  No significant blood loss.  Medihoney was applied followed by dressing. -Continue offloading at all times and wear supportive shoes.  -Sharply debrided the nails x8 without any complications or bleeding. -Monitor for any clinical  signs or symptoms of infection and directed to call the office immediately should any occur or go to the ER.  [name] DPM

## 2021-08-23 DIAGNOSIS — E1142 Type 2 diabetes mellitus with diabetic polyneuropathy: Secondary | ICD-10-CM | POA: Diagnosis not present

## 2021-08-23 DIAGNOSIS — G8314 Monoplegia of lower limb affecting left nondominant side: Secondary | ICD-10-CM | POA: Diagnosis not present

## 2021-08-23 DIAGNOSIS — F339 Major depressive disorder, recurrent, unspecified: Secondary | ICD-10-CM | POA: Diagnosis not present

## 2021-08-23 DIAGNOSIS — E785 Hyperlipidemia, unspecified: Secondary | ICD-10-CM | POA: Diagnosis not present

## 2021-08-23 DIAGNOSIS — C50912 Malignant neoplasm of unspecified site of left female breast: Secondary | ICD-10-CM | POA: Diagnosis not present

## 2021-08-23 DIAGNOSIS — E1122 Type 2 diabetes mellitus with diabetic chronic kidney disease: Secondary | ICD-10-CM | POA: Diagnosis not present

## 2021-08-23 DIAGNOSIS — D692 Other nonthrombocytopenic purpura: Secondary | ICD-10-CM | POA: Diagnosis not present

## 2021-08-23 DIAGNOSIS — I1310 Hypertensive heart and chronic kidney disease without heart failure, with stage 1 through stage 4 chronic kidney disease, or unspecified chronic kidney disease: Secondary | ICD-10-CM | POA: Diagnosis not present

## 2021-08-23 DIAGNOSIS — G9782 Other postprocedural complications and disorders of nervous system: Secondary | ICD-10-CM | POA: Diagnosis not present

## 2021-08-23 DIAGNOSIS — I429 Cardiomyopathy, unspecified: Secondary | ICD-10-CM | POA: Diagnosis not present

## 2021-08-23 DIAGNOSIS — I7300 Raynaud's syndrome without gangrene: Secondary | ICD-10-CM | POA: Diagnosis not present

## 2021-08-25 ENCOUNTER — Ambulatory Visit
Admission: RE | Admit: 2021-08-25 | Discharge: 2021-08-25 | Disposition: A | Discharge status: Home / Self Care | Payer: PPO | Source: Ambulatory Visit | Attending: Family Medicine | Admitting: Family Medicine

## 2021-08-25 DIAGNOSIS — R1900 Intra-abdominal and pelvic swelling, mass and lump, unspecified site: Secondary | ICD-10-CM | POA: Diagnosis not present

## 2021-08-25 DIAGNOSIS — K5730 Diverticulosis of large intestine without perforation or abscess without bleeding: Secondary | ICD-10-CM | POA: Diagnosis not present

## 2021-08-25 DIAGNOSIS — R198 Other specified symptoms and signs involving the digestive system and abdomen: Secondary | ICD-10-CM

## 2021-08-25 DIAGNOSIS — K8689 Other specified diseases of pancreas: Secondary | ICD-10-CM | POA: Diagnosis not present

## 2021-08-25 DIAGNOSIS — K760 Fatty (change of) liver, not elsewhere classified: Secondary | ICD-10-CM | POA: Diagnosis not present

## 2021-08-31 ENCOUNTER — Ambulatory Visit: Payer: PPO | Admitting: Podiatry

## 2021-08-31 DIAGNOSIS — L97522 Non-pressure chronic ulcer of other part of left foot with fat layer exposed: Secondary | ICD-10-CM | POA: Diagnosis not present

## 2021-09-08 NOTE — Progress Notes (Signed)
Subjective: 71 year old female presents the office today for follow-up evaluation of a wound on her left foot.  She states that she is doing about the same as she has noticed bleeding to the wound but no pus.  No swelling or redness that she reports.  Denies any fevers or chills.  She has no other concerns today.   Objective: AAO x3, NAD Ulceration of the plantar aspect left foot submetatarsal 1 with hyperkeratotic periwound.  Hyperkeratotic tissue noted however after debridement there still is a wound is still present measuring about the same in size 0.6 x 0.3 x 0.2 cm after debridement.  Prior to debridement was slightly smaller due to the hyperkeratotic periwound measuring 0.4 x 0.3 x 0.1 cm.  There is no probing, undermining or tunneling.  No fluctuation or crepitation. Significant bunion is present. No pain with calf compression, swelling, warmth, erythema  Assessment: 71 year old female left foot recurrent ulceration  Plan: -All treatment options discussed with the patient including all alternatives, risks, complications.  -Sharply debrided the hyperkeratotic lesion, ulcer without any complications utilizing #681 with scalpel.  This was debrided down to healthy, granular tissue through the nonviable devitalized tissue.  There is no significant blood loss.  I cleansed the wound with saline and hemostasis achieved.  No significant blood loss.  Medihoney was applied followed by dressing. -She has silver collagen dressings at home that were many years. -Continue offloading at all times and wear supportive shoes.  -Monitor for any clinical signs or symptoms of infection and directed to call the office immediately should any occur or go to the ER.  *If no improvement consider wound care referral  Trula Slade DPM

## 2021-09-14 DIAGNOSIS — N183 Chronic kidney disease, stage 3 unspecified: Secondary | ICD-10-CM | POA: Diagnosis not present

## 2021-09-14 DIAGNOSIS — I129 Hypertensive chronic kidney disease with stage 1 through stage 4 chronic kidney disease, or unspecified chronic kidney disease: Secondary | ICD-10-CM | POA: Diagnosis not present

## 2021-09-14 DIAGNOSIS — E1121 Type 2 diabetes mellitus with diabetic nephropathy: Secondary | ICD-10-CM | POA: Diagnosis not present

## 2021-09-14 DIAGNOSIS — E785 Hyperlipidemia, unspecified: Secondary | ICD-10-CM | POA: Diagnosis not present

## 2021-09-14 DIAGNOSIS — F3341 Major depressive disorder, recurrent, in partial remission: Secondary | ICD-10-CM | POA: Diagnosis not present

## 2021-09-28 ENCOUNTER — Ambulatory Visit: Payer: PPO | Admitting: Podiatry

## 2021-09-28 ENCOUNTER — Ambulatory Visit (INDEPENDENT_AMBULATORY_CARE_PROVIDER_SITE_OTHER): Payer: PPO

## 2021-09-28 DIAGNOSIS — M21619 Bunion of unspecified foot: Secondary | ICD-10-CM | POA: Diagnosis not present

## 2021-09-28 DIAGNOSIS — L97522 Non-pressure chronic ulcer of other part of left foot with fat layer exposed: Secondary | ICD-10-CM

## 2021-09-28 MED ORDER — DOXYCYCLINE HYCLATE 100 MG PO CAPS: 100.0000 mg | ORAL_CAPSULE | Freq: Two times a day (BID) | ORAL | 0 refills | Status: DC

## 2021-10-04 NOTE — Progress Notes (Signed)
Subjective: 71 year old female presents the office today for follow-up evaluation of a wound on her left foot.  States that she has been trying to wear shoes more as the wound is not healing she still notices bleeding.  No fevers or chills that she reports.  No chest pain shortness of breath.  Objective: AAO x3, NAD Ulceration of the plantar aspect left foot submetatarsal 1 with hyperkeratotic periwound.  Hyperkeratotic tissue noted however after debridement there still is a wound is still present measuring larger today 1 x 0.6 x 0.2 cm with hyperkeratotic periwound.  Pre and post wound measurements were the same.  There is no probing, undermining or tunneling.  No fluctuance or crepitation.  There is no malodor. Significant bunion is present. No pain with calf compression, swelling, warmth, erythema  Assessment: 71 year old female left foot recurrent ulceration  Plan: -All treatment options discussed with the patient including all alternatives, risks, complications.  -X-ray obtained reviewed left foot.  3 views were obtained.  No evidence of acute fracture.  No definitive evidence of osteomyelitis. Severe bunion present.  -Sharply debrided the hyperkeratotic lesion, ulcer without any complications utilizing #782 with scalpel.  This was debrided down to healthy, granular tissue through the nonviable devitalized tissue.  There is no significant blood loss.  I cleansed the wound with saline and hemostasis achieved.  No significant blood loss.  Medihoney was applied followed by dressing. -Continue daily dressing changes. -Given ongoing nature of the wound will refer to the wound care center as well. -At the end of the appointment the patient stood up and she got dizzy.  Her blood pressure was low.  We sat her back down and rechecked the blood pressure and it was up to the normal range.  She denies any shortness of breath, chest pain or any other symptoms.  Discussed that if the symptoms were to recur  she is to go to the emergency room.  She states this happens at times and she has been diagnosed with orthostatic hypotension previously.  -Continue offloading at all times and wear supportive shoes-discussed importance of wearing offloading at all times.  At this time she is also open to the surgical shoe. -Patient's sister was also present today and wanted to bring up bunion surgery.  Discussed surgical intervention for this as well today. -Monitor for any clinical signs or symptoms of infection and directed to call the office immediately should any occur or go to the ER.  Trula Slade DPM

## 2021-10-12 ENCOUNTER — Ambulatory Visit: Payer: PPO | Admitting: Podiatry

## 2021-10-12 DIAGNOSIS — L97522 Non-pressure chronic ulcer of other part of left foot with fat layer exposed: Secondary | ICD-10-CM

## 2021-10-12 NOTE — Progress Notes (Signed)
Subjective:  Chief Complaint  Patient presents with   Diabetic Ulcer    A1c- 6.2 BG- 170, doing better than last visit, Tx: wound care appt Aug 35th     71 year old female presents the office today for follow-up evaluation of a wound on her left foot.  She states has been wearing her shoes more.  Still gets some bleeding but no pus.  No swelling or redness that she reports.  No fevers or chills that she reports.  No chest pain shortness of breath.  Objective: AAO x3, NAD Ulceration of the plantar aspect left foot submetatarsal 1 with hyperkeratotic periwound.  Hyperkeratotic tissue noted however after debridement there still is a wound is still present measuring larger today 0.8 x 0.5 x 0.2 cm with hyperkeratotic periwound.  Pre and post wound measurements were the same.  There is no probing, undermining or tunneling.  No fluctuance or crepitation.  There is no malodor. Significant bunion is present. No pain with calf compression, swelling, warmth, erythema  Assessment: 71 year old female left foot recurrent ulceration  Plan: -All treatment options discussed with the patient including all alternatives, risks, complications.  -Sharply debrided the hyperkeratotic lesion, ulcer without any complications utilizing #017 with scalpel.  This was debrided down to healthy, granular tissue through the nonviable devitalized tissue.  There is no significant blood loss.  I cleansed the wound with saline and hemostasis achieved.  No significant blood loss.  Medihoney was applied followed by dressing. -Continue daily dressing changes. -Has appointment to wound care center. -Continue offloading. -Monitor for any clinical signs or symptoms of infection and directed to call the office immediately should any occur or go to the ER.  Trula Slade DPM

## 2021-10-12 NOTE — Patient Instructions (Signed)
Follow up at the wound care center. If you need anything, please let me know.

## 2021-10-14 DIAGNOSIS — E1121 Type 2 diabetes mellitus with diabetic nephropathy: Secondary | ICD-10-CM | POA: Diagnosis not present

## 2021-10-14 DIAGNOSIS — E785 Hyperlipidemia, unspecified: Secondary | ICD-10-CM | POA: Diagnosis not present

## 2021-10-14 DIAGNOSIS — F3341 Major depressive disorder, recurrent, in partial remission: Secondary | ICD-10-CM | POA: Diagnosis not present

## 2021-10-14 DIAGNOSIS — Z89411 Acquired absence of right great toe: Secondary | ICD-10-CM | POA: Diagnosis not present

## 2021-10-14 DIAGNOSIS — Z794 Long term (current) use of insulin: Secondary | ICD-10-CM | POA: Diagnosis not present

## 2021-10-14 DIAGNOSIS — I10 Essential (primary) hypertension: Secondary | ICD-10-CM | POA: Diagnosis not present

## 2021-10-14 DIAGNOSIS — E1142 Type 2 diabetes mellitus with diabetic polyneuropathy: Secondary | ICD-10-CM | POA: Diagnosis not present

## 2021-10-21 ENCOUNTER — Encounter (HOSPITAL_BASED_OUTPATIENT_CLINIC_OR_DEPARTMENT_OTHER): Payer: PPO | Attending: Internal Medicine | Admitting: Internal Medicine

## 2021-10-21 DIAGNOSIS — L97522 Non-pressure chronic ulcer of other part of left foot with fat layer exposed: Secondary | ICD-10-CM

## 2021-10-21 DIAGNOSIS — E1142 Type 2 diabetes mellitus with diabetic polyneuropathy: Secondary | ICD-10-CM | POA: Diagnosis not present

## 2021-10-21 DIAGNOSIS — E11621 Type 2 diabetes mellitus with foot ulcer: Secondary | ICD-10-CM

## 2021-10-21 DIAGNOSIS — Z794 Long term (current) use of insulin: Secondary | ICD-10-CM | POA: Insufficient documentation

## 2021-10-21 NOTE — Progress Notes (Signed)
ILINE, BUCHINGER ([id number]) Visit Report for 10/21/2021 Abuse Risk Screen Details Patient Name: Date of Service: [name] 10/21/2021 1:15 PM Medical Record Number: [id number] Patient Account Number: [id number] Date of Birth/Sex: Treating RN: [birth year] (71 y.o. [name] Primary Care Parks Czajkowski: [name] Other Clinician: Referring Nevaan Bunton: Treating Norleen Xie/Extender: Clemon Chambers in Treatment: 0 Abuse Risk Screen Items Answer ABUSE RISK SCREEN: Has anyone close to you tried to hurt or harm you recentlyo No Do you feel uncomfortable with anyone in your familyo No Has anyone forced you do things that you didnt want to doo No Electronic Signature(s) Signed: 10/21/2021 4:29:20 PM By: Lorrin Jackson Entered By: Lorrin Jackson on 10/21/2021 13:05:32 -------------------------------------------------------------------------------- Activities of Daily Living Details Patient Name: Date of Service: [name] A. 10/21/2021 1:15 PM Medical Record Number: [id number] Patient Account Number: [id number] Date of Birth/Sex: Treating RN: April 06, 1950 (71 y.o. [name] Primary Care Karim Aiello: [name] Other Clinician: Referring Chico Cawood: Treating Gotti Alwin/Extender: Clemon Chambers in Treatment: 0 Activities of Daily Living Items Answer Activities of Daily Living (Please select one for each item) Drive Automobile Completely Able T Medications ake Completely Able Use T elephone Completely Able Care for Appearance Completely Able Use T oilet Completely Able Bath / Shower Completely Able Dress Self Completely Able Feed Self Completely Able Walk Need Assistance Get In / Out Bed Completely Able Housework Completely Able Prepare Meals Completely [place] for Self Completely Able Electronic Signature(s) Signed: 10/21/2021 4:29:20 PM By: Lorrin Jackson Entered By: Lorrin Jackson  on 10/21/2021 13:06:06 -------------------------------------------------------------------------------- Education Screening Details Patient Name: Date of Service: [name], [name] RA H A. 10/21/2021 1:15 PM Medical Record Number: [id number] Patient Account Number: [id number] Date of Birth/Sex: Treating RN: 01/20/1951 (71 y.o. [name] Primary Care Conrado Nance: [name] Other Clinician: Referring Talulah Schirmer: Treating Florrie Ramires/Extender: Clemon Chambers in Treatment: 0 Primary Learner Assessed: Patient Learning Preferences/Education Level/Primary Language Learning Preference: Explanation, Demonstration, Printed Material Highest Education Level: College or Above Preferred Language: English Cognitive Barrier Language Barrier: No Translator Needed: No Memory Deficit: No Emotional Barrier: No Cultural/Religious Beliefs Affecting Medical Care: No Physical Barrier Impaired Vision: Yes Glasses Impaired Hearing: No Decreased Hand dexterity: No Knowledge/Comprehension Knowledge Level: High Comprehension Level: High Ability to understand written instructions: High Ability to understand verbal instructions: High Motivation Anxiety Level: Calm Cooperation: Cooperative Education Importance: Acknowledges Need Interest in Health Problems: Asks Questions Perception: Coherent Willingness to Engage in Self-Management High Activities: Readiness to Engage in Self-Management High Activities: Electronic Signature(s) Signed: 10/21/2021 4:29:20 PM By: Lorrin Jackson Entered By: Lorrin Jackson on 10/21/2021 13:06:37 -------------------------------------------------------------------------------- Fall Risk Assessment Details Patient Name: Date of Service: [name], [name] 10/21/2021 1:15 PM Medical Record Number: [id number] Patient Account Number: [id number] Date of Birth/Sex: Treating RN: 02-Dec-1950 (71 y.o. [name] Primary Care Vedha Tercero: [name] Other Clinician: Referring Chloie Loney: Treating Acelyn Basham/Extender: Clemon Chambers in Treatment: 0 Fall Risk Assessment Items Have you had 2 or more falls in the last 12 monthso 0 Yes Have you had any fall that resulted in injury in the last 12 monthso 0 Yes FALLS RISK SCREEN History of falling - immediate or within 3 months 25 Yes Secondary diagnosis (Do you have 2 or more medical diagnoseso) 0 No Ambulatory aid None/bed rest/wheelchair/nurse 0 No Crutches/cane/walker 15 Yes Furniture 0 No Intravenous therapy Access/Saline/Heparin Lock 0 No Gait/Transferring Normal/ bed rest/ wheelchair 0 No  Weak (short steps with or without shuffle, stooped but able to lift head while walking, may seek 10 Yes support from furniture) Impaired (short steps with shuffle, may have difficulty arising from chair, head down, impaired 0 No balance) Mental Status Oriented to own ability 0 Yes Electronic Signature(s) Signed: 10/21/2021 4:29:20 PM By: Lorrin Jackson Entered By: Lorrin Jackson on 10/21/2021 13:07:35 -------------------------------------------------------------------------------- Foot Assessment Details Patient Name: Date of Service: [name], [name] 10/21/2021 1:15 PM Medical Record Number: [id number] Patient Account Number: [id number] Date of Birth/Sex: Treating RN: 12/21/1950 (71 y.o. [name] Primary Care Merrissa Giacobbe: [name] Other Clinician: Referring Akiva Josey: Treating Lenoria Narine/Extender: Clemon Chambers in Treatment: 0 Foot Assessment Items Site Locations + = Sensation present, - = Sensation absent, C = Callus, U = Ulcer R = Redness, W = Warmth, M = Maceration, PU = Pre-ulcerative lesion F = Fissure, S = Swelling, D = Dryness Assessment Right: Left: Other Deformity: No Yes Prior Foot Ulcer: No Yes Prior Amputation: No No Charcot Joint: No No Ambulatory Status: Ambulatory With Help Assistance Device:  Cane Gait: Steady Electronic Signature(s) Signed: 10/21/2021 4:29:20 PM By: Lorrin Jackson Entered By: Lorrin Jackson on 10/21/2021 13:14:40 -------------------------------------------------------------------------------- Nutrition Risk Screening Details Patient Name: Date of Service: [name] A. 10/21/2021 1:15 PM Medical Record Number: [id number] Patient Account Number: [id number] Date of Birth/Sex: Treating RN: Mar 08, 1951 (71 y.o. [name] Primary Care Kimesha Claxton: [name] Other Clinician: Referring Clemente Dewey: Treating Cordera Stineman/Extender: Clemon Chambers in Treatment: 0 Height (in): 70 Weight (lbs): 260 Body Mass Index (BMI): 37.3 Nutrition Risk Screening Items Score Screening NUTRITION RISK SCREEN: I have an illness or condition that made me change the kind and/or amount of food I eat 0 No I eat fewer than two meals per day 0 No I eat few fruits and vegetables, or milk products 0 No I have three or more drinks of beer, liquor or wine almost every day 0 No I have tooth or mouth problems that make it hard for me to eat 0 No I don't always have enough money to buy the food I need 0 No I eat alone most of the time 0 No I take three or more different prescribed or over-the-counter drugs a day 1 Yes Without wanting to, I have lost or gained 10 pounds in the last six months 2 Yes I am not always physically able to shop, cook and/or feed myself 0 No Nutrition Protocols Good Risk Protocol Moderate Risk Protocol 0 Provide education on nutrition High Risk Proctocol Risk Level: Moderate Risk Score: 3 Electronic Signature(s) Signed: 10/21/2021 4:29:20 PM By: Lorrin Jackson Entered By: Lorrin Jackson on 10/21/2021 13:08:04

## 2021-10-21 NOTE — Progress Notes (Signed)
[name], [name] ([id number]) Visit Report for 10/21/2021 Allergy List Details Patient Name: Date of Service: [name] 10/21/2021 1:15 PM Medical Record Number: [id number] Patient Account Number: [id number] Date of Birth/Sex: Treating RN: [birth year] (71 y.o. [name] Primary Care Daiana Vitiello: [name] Other Clinician: Referring Berklie Dethlefs: Treating Magie Ciampa/Extender: Clemon Chambers in Treatment: 0 Allergies Active Allergies penicillin Reaction: Rash Severity: Moderate Sulfa (Sulfonamide Antibiotics) Reaction: Rash Severity: Moderate Cipro Reaction: irritability amitriptyline Reaction: irritable Pristiq Allergy Notes Electronic Signature(s) Signed: 10/21/2021 4:29:20 PM By: Lorrin Jackson Entered By: Lorrin Jackson on 10/21/2021 13:03:21 -------------------------------------------------------------------------------- Arrival Information Details Patient Name: Date of Service: [name], Holly RA H A. 10/21/2021 1:15 PM Medical Record Number: [id number] Patient Account Number: [id number] Date of Birth/Sex: Treating RN: 1951/02/07 (71 y.o. [name] Primary Care Myla Mauriello: [name] Other Clinician: Referring Dathan Attia: Treating Minyon Billiter/Extender: Clemon Chambers in Treatment: 0 Visit Information Patient Arrived: Lyndel Pleasure Time: 12:55 Transfer Assistance: None Patient Identification Verified: Yes Secondary Verification Process Completed: Yes Patient Requires Transmission-Based Precautions: No Patient Has Alerts: Yes Patient Alerts: ABI's R=1.07 L=1.02 Electronic Signature(s) Signed: 10/21/2021 4:29:20 PM By: Lorrin Jackson Entered By: Onnie Boer, History Since Last Visit Added or deleted any medications: No Any new allergies or adverse reactions: No Had a fall or experienced change in activities of daily living that may affect risk of falls: No Signs or symptoms of abuse/neglect since last visito  No Hospitalized since last visit: No Implantable device outside of the clinic excluding cellular tissue based products placed in the center since last visit: No Has Dressing in Place as Prescribed: Yes Pain Present Now: No Jodi on 10/21/2021 13:23:45 -------------------------------------------------------------------------------- Clinic Level of Care Assessment Details Patient Name: Date of Service: [name] 10/21/2021 1:15 PM Medical Record Number: [id number] Patient Account Number: [id number] Date of Birth/Sex: Treating RN: [birth year] (71 y.o. [name] Primary Care Maryln Eastham: [name] Other Clinician: Referring Jaxan Michel: Treating Jolita Haefner/Extender: Clemon Chambers in Treatment: 0 Clinic Level of Care Assessment Items TOOL 1 Quantity Score X- 1 0 Use when EandM and Procedure is performed on INITIAL visit ASSESSMENTS - Nursing Assessment / Reassessment X- 1 20 General Physical Exam (combine w/ comprehensive assessment (listed just below) when performed on new pt. evals) X- 1 25 Comprehensive Assessment (HX, ROS, Risk Assessments, Wounds Hx, etc.) ASSESSMENTS - Wound and Skin Assessment / Reassessment '[]'$  - 0 Dermatologic / Skin Assessment (not related to wound area) ASSESSMENTS - Ostomy and/or Continence Assessment and Care '[]'$  - 0 Incontinence Assessment and Management '[]'$  - 0 Ostomy Care Assessment and Management (repouching, etc.) PROCESS - Coordination of Care '[]'$  - 0 Simple Patient / Family Education for ongoing care X- 1 20 Complex (extensive) Patient / Family Education for ongoing care X- 1 10 Staff obtains Programmer, systems, Records, T Results / Process Orders est X- 1 10 Staff telephones HHA, Nursing Homes / Clarify orders / etc '[]'$  - 0 Routine Transfer to another Facility (non-emergent condition) '[]'$  - 0 Routine Hospital Admission (non-emergent condition) '[]'$  - 0 New Admissions / Biomedical engineer / Ordering NPWT  Apligraf, etc. , '[]'$  - 0 Emergency Hospital Admission (emergent condition) PROCESS - Special Needs '[]'$  - 0 Pediatric / Minor Patient Management '[]'$  - 0 Isolation Patient Management '[]'$  - 0 Hearing / Language / Visual special needs '[]'$  - 0 Assessment of Community assistance (transportation, D/C planning, etc.) '[]'$  - 0 Additional assistance / Altered mentation '[]'$  - 0 Support Surface(s)  Assessment (bed, cushion, seat, etc.) INTERVENTIONS - Miscellaneous '[]'$  - 0 External ear exam '[]'$  - 0 Patient Transfer (multiple staff / Civil Service fast streamer / Similar devices) '[]'$  - 0 Simple Staple / Suture removal (25 or less) '[]'$  - 0 Complex Staple / Suture removal (26 or more) '[]'$  - 0 Hypo/Hyperglycemic Management (do not check if billed separately) '[]'$  - 0 Ankle / Brachial Index (ABI) - do not check if billed separately Has the patient been seen at the hospital within the last three years: Yes Total Score: 85 Level Of Care: New/Established - Level 3 Electronic Signature(s) Signed: 10/21/2021 4:29:20 PM By: Lorrin Jackson Entered By: Lorrin Jackson on 10/21/2021 14:16:11 -------------------------------------------------------------------------------- Encounter Discharge Information Details Patient Name: Date of Service: [name], Holly RA H A. 10/21/2021 1:15 PM Medical Record Number: [id number] Patient Account Number: [id number] Date of Birth/Sex: Treating RN: 1950-04-01 (71 y.o. [name] Primary Care Alida Greiner: [name] Other Clinician: Referring Donnika Kucher: Treating Jie Stickels/Extender: Clemon Chambers in Treatment: 0 Encounter Discharge Information Items Post Procedure Vitals Discharge Condition: Stable Temperature (F): 97.7 Ambulatory Status: Cane Pulse (bpm): 67 Discharge Destination: Home Respiratory Rate (breaths/min): 18 Transportation: Private Auto Blood Pressure (mmHg): 99/65 Schedule Follow-up Appointment: Yes Clinical Summary of Care: Provided on  10/21/2021 Form Type Recipient Paper Patient Patient Electronic Signature(s) Signed: 10/21/2021 4:29:20 PM By: Lorrin Jackson Entered By: Lorrin Jackson on 10/21/2021 14:24:01 -------------------------------------------------------------------------------- Lower Extremity Assessment Details Patient Name: Date of Service: Holly Conroy RA H A. 10/21/2021 1:15 PM Medical Record Number: [id number] Patient Account Number: [id number] Date of Birth/Sex: Treating RN: [birth year] (71 y.o. [name] Primary Care Chonte Ricke: [name] Other Clinician: Referring Maegan Buller: Treating Pearse Shiffler/Extender: Clemon Chambers in Treatment: 0 Edema Assessment Assessed: Shirlyn Goltz: Yes] Patrice Paradise: No] [Left: Edema] [Right: :] Calf Left: Right: Point of Measurement: 34 cm From Medial Instep 46.5 cm Ankle Left: Right: Point of Measurement: 9 cm From Medial Instep 27.5 cm Knee To Floor Left: Right: From Medial Instep 43 cm Vascular Assessment Pulses: Dorsalis Pedis Palpable: [Left:Yes] Electronic Signature(s) Signed: 10/21/2021 4:29:20 PM By: Lorrin Jackson Entered By: Lorrin Jackson on 10/21/2021 13:16:04 -------------------------------------------------------------------------------- Multi Wound Chart Details Patient Name: Date of Service: [name], Holly RA H A. 10/21/2021 1:15 PM Medical Record Number: [id number] Patient Account Number: [id number] Date of Birth/Sex: Treating RN: [birth year] (71 y.o. [name] Primary Care Evoleht Hovatter: [name] Other Clinician: Referring Andreas Sobolewski: Treating Bexley Laubach/Extender: Clemon Chambers in Treatment: 0 Vital Signs Height(in): Capillary Blood Glucose(mg/dl): 183 Weight(lbs): Pulse(bpm): 74 Body Mass Index(BMI): Blood Pressure(mmHg): 99/65 Temperature(F): 97.7 Respiratory Rate(breaths/min): 18 Photos: [N/A:N/A] Left, Plantar Foot N/A N/A Wound Location: Gradually Appeared N/A N/A Wounding  Event: Diabetic Wound/Ulcer of the Lower N/A N/A Primary Etiology: Extremity Cataracts, Sleep Apnea, Hypertension, N/A N/A Comorbid History: Type II Diabetes, Osteoarthritis, Osteomyelitis, Neuropathy, Received Radiation, Confinement Anxiety 10/02/2020 N/A N/A Date Acquired: 0 N/A N/A Weeks of Treatment: Open N/A N/A Wound Status: No N/A N/A Wound Recurrence: 1.3x0.4x0.3 N/A N/A Measurements L x W x D (cm) 0.408 N/A N/A A (cm) : rea 0.123 N/A N/A Volume (cm) : 0.00% N/A N/A % Reduction in A rea: 0.00% N/A N/A % Reduction in Volume: 11 Starting Position 1 (o'clock): 2 Ending Position 1 (o'clock): 0.3 Maximum Distance 1 (cm): Yes N/A N/A Undermining: Grade 1 N/A N/A Classification: Medium N/A N/A Exudate A mount: Serosanguineous N/A N/A Exudate Type: red, brown N/A N/A Exudate Color: Distinct, outline attached N/A N/A Wound Margin: Large (67-100%) N/A N/A Granulation  A mount: Red N/A N/A Granulation Quality: Small (1-33%) N/A N/A Necrotic Amount: Fat Layer (Subcutaneous Tissue): Yes N/A N/A Exposed Structures: Fascia: No Tendon: No Muscle: No Joint: No Bone: No None N/A N/A Epithelialization: Debridement - Excisional N/A N/A Debridement: Pre-procedure Verification/Time Out 13:59 N/A N/A Taken: Callus, Subcutaneous, Slough N/A N/A Tissue Debrided: Skin/Subcutaneous Tissue N/A N/A Level: 0.52 N/A N/A Debridement A (sq cm): rea Curette N/A N/A Instrument: Minimum N/A N/A Bleeding: Pressure N/A N/A Hemostasis A chieved: Procedure was tolerated well N/A N/A Debridement Treatment Response: 1.3x0.4x0.3 N/A N/A Post Debridement Measurements L x W x D (cm) 0.123 N/A N/A Post Debridement Volume: (cm) calloused periwond N/A N/A Assessment Notes: Debridement N/A N/A Procedures Performed: Treatment Notes Electronic Signature(s) Signed: 10/21/2021 2:50:56 PM By: [name] DO Signed: 10/21/2021 4:29:20 PM By: Lorrin Jackson Entered By:  [name] on 10/21/2021 14:16:13 -------------------------------------------------------------------------------- Multi-Disciplinary Care Plan Details Patient Name: Date of Service: [name], Holly Dess RA H A. 10/21/2021 1:15 PM Medical Record Number: [id number] Patient Account Number: [id number] Date of Birth/Sex: Treating RN: Dec 12, 1950 (71 y.o. [name] Primary Care Raymon Schlarb: [name] Other Clinician: Referring Lulamae Skorupski: Treating Aubrionna Istre/Extender: Clemon Chambers in Treatment: 0 Active Inactive Nutrition Nursing Diagnoses: Impaired glucose control: actual or potential Goals: Patient/caregiver verbalizes understanding of need to maintain therapeutic glucose control per primary care physician Date Initiated: 10/21/2021 Target Resolution Date: 11/18/2021 Goal Status: Active Interventions: Assess HgA1c results as ordered upon admission and as needed Provide education on elevated blood sugars and impact on wound healing Notes: Wound/Skin Impairment Nursing Diagnoses: Impaired tissue integrity Goals: Patient/caregiver will verbalize understanding of skin care regimen Date Initiated: 10/21/2021 Target Resolution Date: 11/18/2021 Goal Status: Active Ulcer/skin breakdown will have a volume reduction of 30% by week 4 Date Initiated: 10/21/2021 Target Resolution Date: 11/18/2021 Goal Status: Active Interventions: Assess patient/caregiver ability to obtain necessary supplies Assess patient/caregiver ability to perform ulcer/skin care regimen upon admission and as needed Assess ulceration(s) every visit Provide education on ulcer and skin care Treatment Activities: Topical wound management initiated : 10/21/2021 Notes: Electronic Signature(s) Signed: 10/21/2021 4:29:20 PM By: Lorrin Jackson Entered By: Lorrin Jackson on 10/21/2021 12:55:18 -------------------------------------------------------------------------------- Pain Assessment Details Patient  Name: Date of Service: [name], Holly Dess RA H A. 10/21/2021 1:15 PM Medical Record Number: [id number] Patient Account Number: [id number] Date of Birth/Sex: Treating RN: Sep 16, 1950 (71 y.o. [name] Primary Care Ilea Hilton: [name] Other Clinician: Referring Darnesha Diloreto: Treating Kasandra Fehr/Extender: Clemon Chambers in Treatment: 0 Active Problems Location of Pain Severity and Description of Pain Patient Has Paino No Site Locations Pain Management and Medication Current Pain Management: Electronic Signature(s) Signed: 10/21/2021 4:29:20 PM By: Lorrin Jackson Entered By: Lorrin Jackson on 10/21/2021 13:20:02 -------------------------------------------------------------------------------- Patient/Caregiver Education Details Patient Name: Date of Service: Holly Alar A. 8/3/2023andnbsp1:15 PM Medical Record Number: [id number] Patient Account Number: [id number] Date of Birth/Gender: Treating RN: [name] (71 y.o. [name] Primary Care Physician: [name] Other Clinician: Referring Physician: Treating Physician/Extender: Clemon Chambers in Treatment: 0 Education Assessment Education Provided To: Patient Education Topics Provided Elevated Blood Sugar/ Impact on Healing: Methods: Explain/Verbal, Printed Responses: State content correctly Offloading: Methods: Explain/Verbal, Printed Responses: State content correctly Wound/Skin Impairment: Methods: Demonstration, Explain/Verbal, Printed Responses: State content correctly Electronic Signature(s) Signed: 10/21/2021 4:29:20 PM By: Lorrin Jackson Entered By: Lorrin Jackson on 10/21/2021 14:04:02 -------------------------------------------------------------------------------- Wound Assessment Details Patient Name: Date of Service: [name], Holly RA H A. 10/21/2021 1:15 PM Medical Record Number: [id number] Patient  Account Number: [id number] Date of  Birth/Sex: Treating RN: [birth year] (71 y.o. [name] Primary Care Janecia Palau: [name] Other Clinician: Referring Mikael Debell: Treating Holdan Stucke/Extender: Clemon Chambers in Treatment: 0 Wound Status Wound Number: 4 Primary Diabetic Wound/Ulcer of the Lower Extremity Etiology: Wound Location: Left, Plantar Foot Wound Open Wounding Event: Gradually Appeared Status: Date Acquired: 10/02/2020 Comorbid Cataracts, Sleep Apnea, Hypertension, Type II Diabetes, Weeks Of Treatment: 0 History: Osteoarthritis, Osteomyelitis, Neuropathy, Received Radiation, Clustered Wound: No Confinement Anxiety Photos Wound Measurements Length: (cm) 1.3 Width: (cm) 0.4 Depth: (cm) 0.3 Area: (cm) 0.408 Volume: (cm) 0.123 % Reduction in Area: 0% % Reduction in Volume: 0% Epithelialization: None Tunneling: No Undermining: Yes Starting Position (o'clock): 11 Ending Position (o'clock): 2 Maximum Distance: (cm) 0.3 Wound Description Classification: Grade 1 Wound Margin: Distinct, outline attached Exudate Amount: Medium Exudate Type: Serosanguineous Exudate Color: red, brown Foul Odor After Cleansing: No Slough/Fibrino Yes Wound Bed Granulation Amount: Large (67-100%) Exposed Structure Granulation Quality: Red Fascia Exposed: No Necrotic Amount: Small (1-33%) Fat Layer (Subcutaneous Tissue) Exposed: Yes Necrotic Quality: Adherent Slough Tendon Exposed: No Muscle Exposed: No Joint Exposed: No Bone Exposed: No Assessment Notes calloused periwond Electronic Signature(s) Signed: 10/21/2021 4:29:20 PM By: Lorrin Jackson Entered By: Lorrin Jackson on 10/21/2021 13:22:53 -------------------------------------------------------------------------------- Vitals Details Patient Name: Date of Service: [name], Holly RA H A. 10/21/2021 1:15 PM Medical Record Number: [id number] Patient Account Number: [id number] Date of Birth/Sex: Treating RN: Jan 17, 1951 (71 y.o. [name] Primary Care Adelae Yodice: [name] Other Clinician: Referring Pricella Gaugh: Treating Gurman Ashland/Extender: Clemon Chambers in Treatment: 0 Vital Signs Time Taken: 13:08 Temperature (F): 97.7 Pulse (bpm): 67 Respiratory Rate (breaths/min): 18 Blood Pressure (mmHg): 99/65 Capillary Blood Glucose (mg/dl): 183 Reference Range: 80 - 120 mg / dl Electronic Signature(s) Signed: 10/21/2021 4:29:20 PM By: Lorrin Jackson Entered By: Lorrin Jackson on 10/21/2021 13:10:35

## 2021-10-21 NOTE — Progress Notes (Signed)
AMANAT, HACKEL ([id number]) Visit Report for 10/21/2021 Chief Complaint Document Details Patient Name: Date of Service: [name] 10/21/2021 1:15 PM Medical Record Number: [id number] Patient Account Number: [id number] Date of Birth/Sex: Treating RN: 1950/06/14 (70 y.o. [name] Primary Care Provider: [name] Other Clinician: Referring Provider: Treating Provider/Extender: Clemon Chambers in Treatment: 0 Information Obtained from: Patient Chief Complaint Left plantar ulcer 11/12/2019; patient is here referred by podiatry for review of a wound on the left first plantar metatarsal head 03/30/2021; right second toe wound s/p Trauma Electronic Signature(s) Signed: 10/21/2021 2:50:56 PM By: [name] DO Entered By: [name] on 10/21/2021 14:16:22 -------------------------------------------------------------------------------- Debridement Details Patient Name: Date of Service: [name], [name] 10/21/2021 1:15 PM Medical Record Number: [id number] Patient Account Number: [id number] Date of Birth/Sex: Treating RN: 12-28-1950 (71 y.o. [name] Primary Care Provider: [name] Other Clinician: Referring Provider: Treating Provider/Extender: Clemon Chambers in Treatment: 0 Debridement Performed for Assessment: Wound #4 Left,Plantar Metatarsal head first Performed By: Physician [name], DO Debridement Type: Debridement Severity of Tissue Pre Debridement: Fat layer exposed Level of Consciousness (Pre-procedure): Awake and Alert Pre-procedure Verification/Time Out Yes - 13:59 Taken: Start Time: 14:00 T Area Debrided (L x W): otal 1.3 (cm) x 0.4 (cm) = 0.52 (cm) Tissue and other material debrided: Non-Viable, Callus, Slough, Subcutaneous, Slough Level: Skin/Subcutaneous Tissue Debridement Description: Excisional Instrument: Curette Bleeding: Minimum Hemostasis Achieved:  Pressure End Time: 14:06 Response to Treatment: Procedure was tolerated well Level of Consciousness (Post- Awake and Alert procedure): Post Debridement Measurements of Total Wound Length: (cm) 1.3 Width: (cm) 0.4 Depth: (cm) 0.3 Volume: (cm) 0.123 Character of Wound/Ulcer Post Debridement: Stable Severity of Tissue Post Debridement: Fat layer exposed Post Procedure Diagnosis Same as Pre-procedure Electronic Signature(s) Signed: 10/21/2021 2:50:56 PM By: [name] DO Signed: 10/21/2021 4:29:20 PM By: Lorrin Jackson Entered By: Lorrin Jackson on 10/21/2021 14:06:29 -------------------------------------------------------------------------------- HPI Details Patient Name: Date of Service: [name], [name] 10/21/2021 1:15 PM Medical Record Number: [id number] Patient Account Number: [id number] Date of Birth/Sex: Treating RN: 1950/11/05 (71 y.o. [name] Primary Care Provider: [name] Other Clinician: Referring Provider: Treating Provider/Extender: Clemon Chambers in Treatment: 0 History of Present Illness HPI Description: 05/10/17 patient presents today for initial evaluation and our clinic concerning issues that she has been having with an ulceration on the plantar aspect of the left first metatarsal head. She has been seen a podiatrist Dr. Jacqualyn Posey for this since August 2018. Subsequently he has recommended bunion surgery as that seems to be the culprit for why there is pressure and friction occurring at the site causing the callous and subsequently the wound. 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 With that being said I do have notes from several of his visits one of which does include an x-ray documentation stating that she had a negative x-ray. There was obviously the bunion the notes I have  extended from 11 deformity. With that being said she has not had an MRI fortunately this wound does not probe to bone. The notes I have extended from February 08, 2017 through April 14, 2017. During that course she was also placed on antibiotics to help with an infection two weeks ago and this was doxycycline. Fortunately that seems to have completely  resolved any infection issues that she may have had. Unfortunately this is an ulcer that she has actually been dealing with since April 2018. She just initially was trying to treat it and care for it on her own. Her most recent hemoglobin A1c was 5.8, she is a non-smoker, she does have a right first toe amputation and a left third toe amputation. Fortunately she's not having any discomfort at the site. 05/17/17 on evaluation today patient's ulcer on the plantar aspect of the first metatarsal region appears to be close at this point. There does not appear to be any evidence of ulcer opening. This is on initial inspection. She has not really had any discomfort but honestly she really was not experiencing a lot of pain even before. There is no evidence of infection or fluctuation under the callous which is present at this point. ADMISSION 11/12/2019 This is a 71 year old woman that we previously had in this clinic in 2017 for two visits with a wound I think roughly in the same area. At that point she had a bunion in the left. She has apparently had surgery on this since then. Looking through care everywhere we can see that she has had follow-up with podiatry from 06/12/2019 through 10/28/2019 for a wound on the plantar aspect of her left first MTP. She states when this started she was having severe knee pain on the right which forced her to alter her gait. She has recently received doxycycline which she is finished and Bactroban. She is back to using Medihoney on the wound. She is offloading with the Pegasys shoe. Past medical history includes type 2 diabetes  with a recent hemoglobin A1c of 6.8, severe type II diabetic neuropathy, history of a CVA, stage III chronic renal failure hypertension, depression, of bilateral previous bunion surgery, right first toe amputation and a partial left third toe amputation for osteomyelitis. The patient states that she recently had work to do in her mother's house in [place] and so she was on her feet quite a lot which delayed the healing. ABI in our clinic was 1.1 on the left 8/31; plantar aspect of her left first metatarsal head. Using silver collagen. This is probably a surgical wound 9/14; this is a patient who had a wound on the plantar aspect of her left first metatarsal head in the setting of a significant bunion deformity. We use cervical collagen and a offloading shoe. She has significant diabetic neuropathy probably some degree of a Charcot deformity in her feet. This is closed over today. Readmission 03/30/2021 Ms. Deshawnda Acrey is a 71 year old female with a past medical history of insulin-dependent controlled type 2 diabetes s/p right great toe amputation, right knee osteoarthritis and hypertension that presents to the clinic for a second right toe wound. She states she was getting out of the bathtub when her foot got caught the tub and the skin to her second toe was split open. She noticed this after the bath when she saw blood on the floor. She has neuropathy in her feet. She visited the ED following the event on 03/26/2021. They obtained x-rays that showed a fracture to the fifth toe but no fracture to the second toe. She was started on doxycycline. She currently denies signs of infection. She has been keeping the area covered with a Band-Aid. Readmission 10/21/2021 Ms. Amyjo Thurmond is a 71 year old female with a past medical history of insulin dependent controlled type 2 diabetes complicated by peripheral neuropathy that presents to the clinic  for a left met head wound. She has been following with Dr.  Earleen Newport, podiatry for over a year for this issue. She has tried collagen, mupirocin ointment and Medihoney. She is at high fall risk and cannot tolerate a total contact cast. She has orthotics with specialized inserts for her current issue. She currently denies signs of infection. Electronic Signature(s) Signed: 10/21/2021 2:50:56 PM By: [name] DO Entered By: [name] on 10/21/2021 14:17:55 -------------------------------------------------------------------------------- Physical Exam Details Patient Name: Date of Service: Oswaldo Conroy RA H A. 10/21/2021 1:15 PM Medical Record Number: [id number] Patient Account Number: [id number] Date of Birth/Sex: Treating RN: 09/30/1950 (71 y.o. [name] Primary Care Provider: [name] Other Clinician: Referring Provider: Treating Provider/Extender: Clemon Chambers in Treatment: 0 Constitutional respirations regular, non-labored and within target range for patient.. Cardiovascular 2+ dorsalis pedis/posterior tibialis pulses. Psychiatric pleasant and cooperative. Notes Left foot: T the first submetatarsal she has an open wound with callus nonviable tissue and granulation tissue present. Postdebridement there is granulation o tissue throughout. No signs of surrounding infection. Electronic Signature(s) Signed: 10/21/2021 2:50:56 PM By: [name] DO Entered By: [name] on 10/21/2021 14:18:43 -------------------------------------------------------------------------------- Physician Orders Details Patient Name: Date of Service: [name], [name] 10/21/2021 1:15 PM Medical Record Number: [id number] Patient Account Number: [id number] Date of Birth/Sex: Treating RN: [birth year] (71 y.o. [name] Primary Care Provider: [name] Other Clinician: Referring Provider: Treating Provider/Extender: Clemon Chambers in Treatment: 0 Verbal / Phone Orders:  No Diagnosis Coding ICD-10 Coding Code Description [phone] Non-pressure chronic ulcer of other part of left foot with fat layer exposed E11.621 Type 2 diabetes mellitus with foot ulcer E11.42 Type 2 diabetes mellitus with diabetic polyneuropathy Follow-up Appointments ppointment in 1 week. - 10/28/21 @ 10:15am with Dr. Heber [place] and Leveda Anna, RN (Room 7) Return A Bathing/ Shower/ Hygiene May shower and wash wound with soap and water. Edema Control - Lymphedema / SCD / Other Avoid standing for long periods of time. Off-Loading Other: - Diabetic Shoes Relieve pressure/time standing of feet Additional Orders / Instructions Follow Nutritious Diet - Monitor/Control Blood Sugars Wound Treatment Wound #4 - Metatarsal head first Wound Laterality: Plantar, Left Cleanser: Soap and Water 1 x Per Day/30 Days Discharge Instructions: May shower and wash wound with dial antibacterial soap and water prior to dressing change. Peri-Wound Care: Skin Prep (DME) (Generic) 1 x Per Day/30 Days Discharge Instructions: Use skin prep as directed Topical: Regranex Ointment 1 x Per Day/30 Days Prim Dressing: MediHoney Gel, tube 1.5 (oz) 1 x Per Day/30 Days ary Discharge Instructions: Apply until Regranex obtained Prim Dressing: Optifoam Non-Adhesive Dressing, 4x4 in 1 x Per Day/30 Days ary Discharge Instructions: Apply as donut around wound Secondary Dressing: ALLEVYN Gentle Border, 3x3 (in/in) (DME) (Generic) 1 x Per Day/30 Days Discharge Instructions: Apply over primary dressing as directed. Patient Medications llergies: penicillin, Sulfa (Sulfonamide Antibiotics), Cipro, amitriptyline, Pristiq A Notifications Medication Indication Start End 10/21/2021 Regranex DOSE 1 - topical 0.01 % gel - Apply once daily to the wound bed Electronic Signature(s) Signed: 10/21/2021 2:50:56 PM By: [name] DO Previous Signature: 10/21/2021 2:24:10 PM Version By: [name] DO Entered By: [name] on  10/21/2021 14:24:20 -------------------------------------------------------------------------------- Problem List Details Patient Name: Date of Service: [name], [name] 10/21/2021 1:15 PM Medical Record Number: [id number] Patient Account Number: [id number] Date of Birth/Sex: Treating RN: 06-Aug-1950 (71 y.o. [name] Primary Care Provider: [name] Other Clinician: Referring Provider: Treating  Provider/Extender: Clemon Chambers in Treatment: 0 Active Problems ICD-10 Encounter Code Description Active Date MDM Diagnosis [phone] Non-pressure chronic ulcer of other part of left foot with fat layer exposed 10/21/2021 No Yes E11.621 Type 2 diabetes mellitus with foot ulcer 10/21/2021 No Yes E11.42 Type 2 diabetes mellitus with diabetic polyneuropathy 10/21/2021 No Yes Inactive Problems Resolved Problems Electronic Signature(s) Signed: 10/21/2021 2:50:56 PM By: [name] DO Entered By: [name] on 10/21/2021 14:16:08 -------------------------------------------------------------------------------- Progress Note Details Patient Name: Date of Service: [name], [name] 10/21/2021 1:15 PM Medical Record Number: [id number] Patient Account Number: [id number] Date of Birth/Sex: Treating RN: [birth year] (71 y.o. [name] Primary Care Provider: [name] Other Clinician: Referring Provider: Treating Provider/Extender: Clemon Chambers in Treatment: 0 Subjective Chief Complaint Information obtained from Patient Left plantar ulcer 11/12/2019; patient is here referred by podiatry for review of a wound on the left first plantar metatarsal head 03/30/2021; right second toe wound s/p Trauma History of Present Illness (HPI) 05/10/17 patient presents today for initial evaluation and our clinic concerning issues that she has been having with an ulceration on the plantar aspect of the left first metatarsal head. She  has been seen a podiatrist Dr. Jacqualyn Posey for this since August 2018. Subsequently he has recommended bunion surgery as that seems to be the culprit for why there is pressure and friction occurring at the site causing the callous and subsequently the wound. 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 With that being said I do have notes from several of his visits one of which does include an x-ray documentation stating that she had a negative x-ray. There was obviously the bunion the notes I have extended from 11 deformity. With that being said she has not had an MRI fortunately this wound does not probe to bone. The notes I have extended from February 08, 2017 through April 14, 2017. During that course she was also placed on antibiotics to help with an infection two weeks ago and this was doxycycline. Fortunately that seems to have completely resolved any infection issues that she may have had. Unfortunately this is an ulcer that she has actually been dealing with since April 2018. She just initially was trying to treat it and care for it on her own. Her most recent hemoglobin A1c was 5.8, she is a non-smoker, she does have a right first toe amputation and a left third toe amputation. Fortunately she's not having any discomfort at the site. 05/17/17 on evaluation today patient's ulcer on the plantar aspect of the first metatarsal region appears to be close at this point. There does not appear to be any evidence of ulcer opening. This is on initial inspection. She has not really had any discomfort but honestly she really was not experiencing a lot of pain even before. There is no evidence of infection or fluctuation under the callous which is present at this point. ADMISSION 11/12/2019 This is a 71 year old woman that we previously had in this clinic in 2017 for two  visits with a wound I think roughly in the same area. At that point she had a bunion in the left. She has apparently had surgery on this since then. Looking through care everywhere we can see that she has had follow-up with podiatry from 06/12/2019 through 10/28/2019 for a wound on  the plantar aspect of her left first MTP. She states when this started she was having severe knee pain on the right which forced her to alter her gait. She has recently received doxycycline which she is finished and Bactroban. She is back to using Medihoney on the wound. She is offloading with the Pegasys shoe. Past medical history includes type 2 diabetes with a recent hemoglobin A1c of 6.8, severe type II diabetic neuropathy, history of a CVA, stage III chronic renal failure hypertension, depression, of bilateral previous bunion surgery, right first toe amputation and a partial left third toe amputation for osteomyelitis. The patient states that she recently had work to do in her mother's house in [place] and so she was on her feet quite a lot which delayed the healing. ABI in our clinic was 1.1 on the left 8/31; plantar aspect of her left first metatarsal head. Using silver collagen. This is probably a surgical wound 9/14; this is a patient who had a wound on the plantar aspect of her left first metatarsal head in the setting of a significant bunion deformity. We use cervical collagen and a offloading shoe. She has significant diabetic neuropathy probably some degree of a Charcot deformity in her feet. This is closed over today. Readmission 03/30/2021 Ms. Salma Walrond is a 71 year old female with a past medical history of insulin-dependent controlled type 2 diabetes s/p right great toe amputation, right knee osteoarthritis and hypertension that presents to the clinic for a second right toe wound. She states she was getting out of the bathtub when her foot got caught the tub and the skin to her second toe was split open.  She noticed this after the bath when she saw blood on the floor. She has neuropathy in her feet. She visited the ED following the event on 03/26/2021. They obtained x-rays that showed a fracture to the fifth toe but no fracture to the second toe. She was started on doxycycline. She currently denies signs of infection. She has been keeping the area covered with a Band-Aid. Readmission 10/21/2021 Ms. Favor Hottel is a 71 year old female with a past medical history of insulin dependent controlled type 2 diabetes complicated by peripheral neuropathy that presents to the clinic for a left met head wound. She has been following with Dr. Earleen Newport, podiatry for over a year for this issue. She has tried collagen, mupirocin ointment and Medihoney. She is at high fall risk and cannot tolerate a total contact cast. She has orthotics with specialized inserts for her current issue. She currently denies signs of infection. Patient History Information obtained from Patient. Allergies penicillin (Severity: Moderate, Reaction: Rash), Sulfa (Sulfonamide Antibiotics) (Severity: Moderate, Reaction: Rash), Cipro (Reaction: irritability), amitriptyline (Reaction: irritable), Pristiq Family History Cancer - Father, Hypertension - Mother,Father,Siblings, Stroke - Mother,Father, No family history of Diabetes, Heart Disease, Hereditary Spherocytosis, Kidney Disease, Lung Disease, Seizures, Thyroid Problems, Tuberculosis. Social History Never smoker, Marital Status - Single, Alcohol Use - Never, Drug Use - No History, Caffeine Use - Rarely. Medical History Eyes Patient has history of Cataracts Denies history of Glaucoma, Optic Neuritis Ear/Nose/Mouth/Throat Denies history of Chronic sinus problems/congestion, Middle ear problems Hematologic/Lymphatic Denies history of Anemia, Hemophilia, Human Immunodeficiency Virus, Lymphedema, Sickle Cell Disease Respiratory Patient has history of Sleep Apnea Denies history of  Aspiration, Asthma, Chronic Obstructive Pulmonary Disease (COPD), Pneumothorax, Tuberculosis Cardiovascular Patient has history of Hypertension Denies history of Angina, Arrhythmia, Congestive Heart Failure, Coronary Artery Disease, Deep Vein Thrombosis, Hypotension, Myocardial Infarction, Peripheral Arterial Disease, Peripheral Venous  Disease, Phlebitis, Vasculitis Gastrointestinal Denies history of Cirrhosis , Colitis, Crohnoos, Hepatitis A, Hepatitis B, Hepatitis C Endocrine Patient has history of Type II Diabetes Denies history of Type I Diabetes Genitourinary Denies history of End Stage Renal Disease Immunological Denies history of Lupus Erythematosus, Raynaudoos, Scleroderma Integumentary (Skin) Denies history of History of Burn Musculoskeletal Patient has history of Osteoarthritis, Osteomyelitis - Hx in 2007 (left 3rd toe) Denies history of Gout, Rheumatoid Arthritis Neurologic Patient has history of Neuropathy Denies history of Dementia, Quadriplegia, Paraplegia, Seizure Disorder Oncologic Patient has history of Received Radiation - 03/2020 Denies history of Received Chemotherapy Psychiatric Patient has history of Confinement Anxiety Denies history of Anorexia/bulimia Hospitalization/Surgery History - Amputation of right great toe. - Tendon repair. - Hysterectomy. - Osteomyelitis left 3rd toe. - cervical fusion with cage. - left breast lumpectomy. Medical A Surgical History Notes nd Constitutional Symptoms (General Health) morbid obesity Cardiovascular Takotsubo cardiomyopathy 2012, hyperlipidemia, Hx CVA, Tachycardia Genitourinary CKD stage 3 Musculoskeletal left leg weakness Neurologic CVA Oncologic hx endometrial cancer, left breast CA Psychiatric depression Review of Systems (ROS) Integumentary (Skin) Complains or has symptoms of Wounds - Left Foot. Objective Constitutional respirations regular, non-labored and within target range for patient.. Vitals  Time Taken: 1:08 PM, Temperature: 97.7 F, Pulse: 67 bpm, Respiratory Rate: 18 breaths/min, Blood Pressure: 99/65 mmHg, Capillary Blood Glucose: 183 mg/dl. Cardiovascular 2+ dorsalis pedis/posterior tibialis pulses. Psychiatric pleasant and cooperative. General Notes: Left foot: T the first submetatarsal she has an open wound with callus nonviable tissue and granulation tissue present. Postdebridement there o is granulation tissue throughout. No signs of surrounding infection. Integumentary (Hair, Skin) Wound #4 status is Open. Original cause of wound was Gradually Appeared. The date acquired was: 10/02/2020. The wound is located on the Left,Plantar Metatarsal head first. The wound measures 1.3cm length x 0.4cm width x 0.3cm depth; 0.408cm^2 area and 0.123cm^3 volume. There is Fat Layer (Subcutaneous Tissue) exposed. There is no tunneling noted, however, there is undermining starting at 11:00 and ending at 2:00 with a maximum distance of 0.3cm. There is a medium amount of serosanguineous drainage noted. The wound margin is distinct with the outline attached to the wound base. There is large (67-100%) red granulation within the wound bed. There is a small (1-33%) amount of necrotic tissue within the wound bed including Adherent Slough. General Notes: calloused periwond Assessment Active Problems ICD-10 Non-pressure chronic ulcer of other part of left foot with fat layer exposed Type 2 diabetes mellitus with foot ulcer Type 2 diabetes mellitus with diabetic polyneuropathy Patient presents with a 1.5-year history of nonhealing ulcer to the first submetatarsal head. I debrided nonviable tissue. No surrounding signs of infection. We discussed the importance of aggressive offloading for her wound healing. Unfortunately she cannot tolerate a total contact cast as she is a high fall risk. She reports 2 falls this past year requiring ED evaluation. She has orthotics with offloading inserts. She knows  that if she cannot appropriately offload this area the wound will never heal. I recommended at this time trying Regranix. Follow-up in 1 week. Procedures Wound #4 Pre-procedure diagnosis of Wound #4 is a Diabetic Wound/Ulcer of the Lower Extremity located on the Left,Plantar Metatarsal head first .Severity of Tissue Pre Debridement is: Fat layer exposed. There was a Excisional Skin/Subcutaneous Tissue Debridement with a total area of 0.52 sq cm performed by [name], DO. With the following instrument(s): Curette to remove Non-Viable tissue/material. Material removed includes Callus, Subcutaneous Tissue, and Slough. No specimens were taken. A time out  was conducted at 13:59, prior to the start of the procedure. A Minimum amount of bleeding was controlled with Pressure. The procedure was tolerated well. Post Debridement Measurements: 1.3cm length x 0.4cm width x 0.3cm depth; 0.123cm^3 volume. Character of Wound/Ulcer Post Debridement is stable. Severity of Tissue Post Debridement is: Fat layer exposed. Post procedure Diagnosis Wound #4: Same as Pre-Procedure Plan Follow-up Appointments: Return Appointment in 1 week. - 10/28/21 @ 10:15am with Dr. Heber [place] and Leveda Anna, RN (Room 7) Bathing/ Shower/ Hygiene: May shower and wash wound with soap and water. Edema Control - Lymphedema / SCD / Other: Avoid standing for long periods of time. Off-Loading: Other: - Diabetic Shoes Relieve pressure/time standing of feet Additional Orders / Instructions: Follow Nutritious Diet - Monitor/Control Blood Sugars The following medication(s) was prescribed: Regranex topical 0.01 % gel 1 Apply once daily to the wound bed starting 10/21/2021 WOUND #4: - Metatarsal head first Wound Laterality: Plantar, Left Cleanser: Soap and Water 1 x Per Day/30 Days Discharge Instructions: May shower and wash wound with dial antibacterial soap and water prior to dressing change. Peri-Wound Care: Skin Prep (DME) (Generic) 1 x Per  Day/30 Days Discharge Instructions: Use skin prep as directed Topical: Regranex Ointment 1 x Per Day/30 Days Prim Dressing: MediHoney Gel, tube 1.5 (oz) 1 x Per Day/30 Days ary Discharge Instructions: Apply until Regranex obtained Prim Dressing: Optifoam Non-Adhesive Dressing, 4x4 in 1 x Per Day/30 Days ary Discharge Instructions: Apply as donut around wound Secondary Dressing: ALLEVYN Gentle Border, 3x3 (in/in) (DME) (Generic) 1 x Per Day/30 Days Discharge Instructions: Apply over primary dressing as directed. 1. In office sharp debridement 2. Regranex 3. Aggressive offloadingooorthotics 4. Follow-up in 1 week Electronic Signature(s) Signed: 10/21/2021 2:50:56 PM By: [name] DO Entered By: [name] on 10/21/2021 14:24:27 -------------------------------------------------------------------------------- HxROS Details Patient Name: Date of Service: [name], [name] 10/21/2021 1:15 PM Medical Record Number: [id number] Patient Account Number: [id number] Date of Birth/Sex: Treating RN: 27-Jul-1950 (71 y.o. [name] Primary Care Provider: [name] Other Clinician: Referring Provider: Treating Provider/Extender: Clemon Chambers in Treatment: 0 Information Obtained From Patient Integumentary (Skin) Complaints and Symptoms: Positive for: Wounds - Left Foot Medical History: Negative for: History of Burn Constitutional Symptoms (General Health) Medical History: Past Medical History Notes: morbid obesity Eyes Medical History: Positive for: Cataracts Negative for: Glaucoma; Optic Neuritis Ear/Nose/Mouth/Throat Medical History: Negative for: Chronic sinus problems/congestion; Middle ear problems Hematologic/Lymphatic Medical History: Negative for: Anemia; Hemophilia; Human Immunodeficiency Virus; Lymphedema; Sickle Cell Disease Respiratory Medical History: Positive for: Sleep Apnea Negative for: Aspiration; Asthma;  Chronic Obstructive Pulmonary Disease (COPD); Pneumothorax; Tuberculosis Cardiovascular Medical History: Positive for: Hypertension Negative for: Angina; Arrhythmia; Congestive Heart Failure; Coronary Artery Disease; Deep Vein Thrombosis; Hypotension; Myocardial Infarction; Peripheral Arterial Disease; Peripheral Venous Disease; Phlebitis; Vasculitis Past Medical History Notes: Takotsubo cardiomyopathy 2012, hyperlipidemia, Hx CVA, Tachycardia Gastrointestinal Medical History: Negative for: Cirrhosis ; Colitis; Crohns; Hepatitis A; Hepatitis B; Hepatitis C Endocrine Medical History: Positive for: Type II Diabetes Negative for: Type I Diabetes Time with diabetes: over 20 years Treated with: Insulin, Oral agents Blood sugar tested every day: Yes Tested : daily Genitourinary Medical History: Negative for: End Stage Renal Disease Past Medical History Notes: CKD stage 3 Immunological Medical History: Negative for: Lupus Erythematosus; Raynauds; Scleroderma Musculoskeletal Medical History: Positive for: Osteoarthritis; Osteomyelitis - Hx in 2007 (left 3rd toe) Negative for: Gout; Rheumatoid Arthritis Past Medical History Notes: left leg weakness Neurologic Medical History: Positive for: Neuropathy Negative for: Dementia; Quadriplegia;  Paraplegia; Seizure Disorder Past Medical History Notes: CVA Oncologic Medical History: Positive for: Received Radiation - 03/2020 Negative for: Received Chemotherapy Past Medical History Notes: hx endometrial cancer, left breast CA Psychiatric Medical History: Positive for: Confinement Anxiety Negative for: Anorexia/bulimia Past Medical History Notes: depression HBO Extended History Items Eyes: Cataracts Immunizations Pneumococcal Vaccine: Received Pneumococcal Vaccination: Yes Received Pneumococcal Vaccination On or After 60th Birthday: Yes Implantable Devices None Hospitalization / Surgery History Type of  Hospitalization/Surgery Amputation of right great toe Tendon repair Hysterectomy Osteomyelitis left 3rd toe cervical fusion with cage left breast lumpectomy Family and Social History Cancer: Yes - Father; Diabetes: No; Heart Disease: No; Hereditary Spherocytosis: No; Hypertension: Yes - Mother,Father,Siblings; Kidney Disease: No; Lung Disease: No; Seizures: No; Stroke: Yes - Mother,Father; Thyroid Problems: No; Tuberculosis: No; Never smoker; Marital Status - Single; Alcohol Use: Never; Drug Use: No History; Caffeine Use: Rarely; Financial Concerns: No; Food, Clothing or Shelter Needs: No; Support System Lacking: No; Transportation Concerns: No Electronic Signature(s) Signed: 10/21/2021 2:50:56 PM By: [name] DO Signed: 10/21/2021 4:29:20 PM By: Lorrin Jackson Entered By: Lorrin Jackson on 10/21/2021 13:05:10 -------------------------------------------------------------------------------- SuperBill Details Patient Name: Date of Service: Oswaldo Conroy RA H A. 10/21/2021 Medical Record Number: [id number] Patient Account Number: [id number] Date of Birth/Sex: Treating RN: 1950-04-12 (71 y.o. [name] Primary Care Provider: [name] Other Clinician: Referring Provider: Treating Provider/Extender: Clemon Chambers in Treatment: 0 Diagnosis Coding ICD-10 Codes Code Description [phone] Non-pressure chronic ulcer of other part of left foot with fat layer exposed E11.621 Type 2 diabetes mellitus with foot ulcer E11.42 Type 2 diabetes mellitus with diabetic polyneuropathy Facility Procedures CPT4 Code: [id number] Description: [facility] VISIT-LEV 3 EST PT Modifier: 25 Quantity: 1 CPT4 Code: [id number] Description: [facility] - DEB SUBQ TISSUE 20 SQ CM/< ICD-10 Diagnosis Description L97.522 Non-pressure chronic ulcer of other part of left foot with fat layer exposed Modifier: Quantity: 1 Physician Procedures : CPT4 Code Description Modifier  [id number] - WC PHYS LEVEL 4 - EST PT ICD-10 Diagnosis Description L97.522 Non-pressure chronic ulcer of other part of left foot with fat layer exposed E11.621 Type 2 diabetes mellitus with foot ulcer E11.42 Type 2  diabetes mellitus with diabetic polyneuropathy Quantity: 1 : [id number] - WC PHYS SUBQ TISS 20 SQ CM ICD-10 Diagnosis Description L97.522 Non-pressure chronic ulcer of other part of left foot with fat layer exposed Quantity: 1 Electronic Signature(s) Signed: 10/21/2021 3:55:06 PM By: [name] DO Entered By: [name] on 10/21/2021 14:24:33

## 2021-10-27 DIAGNOSIS — L97522 Non-pressure chronic ulcer of other part of left foot with fat layer exposed: Secondary | ICD-10-CM | POA: Diagnosis not present

## 2021-10-28 ENCOUNTER — Encounter (HOSPITAL_BASED_OUTPATIENT_CLINIC_OR_DEPARTMENT_OTHER): Payer: PPO | Admitting: Internal Medicine

## 2021-10-28 DIAGNOSIS — L97522 Non-pressure chronic ulcer of other part of left foot with fat layer exposed: Secondary | ICD-10-CM

## 2021-10-28 DIAGNOSIS — E11621 Type 2 diabetes mellitus with foot ulcer: Secondary | ICD-10-CM | POA: Diagnosis not present

## 2021-10-28 NOTE — Progress Notes (Signed)
KEITA, DEMARCO ([id number]) Visit Report for 10/28/2021 Chief Complaint Document Details Patient Name: Date of Service: [name] 10/28/2021 10:15 A M Medical Record Number: [id number] Patient Account Number: [id number] Date of Birth/Sex: Treating RN: 1951/01/21 (71 y.o. [name] Primary Care Provider: [name] Other Clinician: Referring Provider: Treating Provider/Extender: [name] Weeks in Treatment: 1 Information Obtained from: Patient Chief Complaint Left plantar ulcer 11/12/2019; patient is here referred by podiatry for review of a wound on the left first plantar metatarsal head 03/30/2021; right second toe wound s/p Trauma Electronic Signature(s) Signed: 10/28/2021 12:56:03 PM By: [name] DO Entered By: [name] on 10/28/2021 11:12:53 -------------------------------------------------------------------------------- Debridement Details Patient Name: Date of Service: [name], [name] 10/28/2021 10:15 A M Medical Record Number: [id number] Patient Account Number: [id number] Date of Birth/Sex: Treating RN: June 16, 1950 (71 y.o. [name] Primary Care Provider: [name] Other Clinician: Referring Provider: Treating Provider/Extender: [name] Weeks in Treatment: 1 Debridement Performed for Assessment: Wound #4 Left,Plantar Metatarsal head first Performed By: Physician [name], DO Debridement Type: Debridement Severity of Tissue Pre Debridement: Fat layer exposed Level of Consciousness (Pre-procedure): Awake and Alert Pre-procedure Verification/Time Out Yes - 10:51 Taken: Start Time: 10:52 Pain Control: Lidocaine 4% T opical Solution T Area Debrided (L x W): otal 0.8 (cm) x 0.3 (cm) = 0.24 (cm) Tissue and other material debrided: Non-Viable, Callus, Slough, Subcutaneous, Slough Level: Skin/Subcutaneous Tissue Debridement Description: Excisional Instrument:  Curette Bleeding: Minimum Hemostasis Achieved: Pressure End Time: 10:56 Response to Treatment: Procedure was tolerated well Level of Consciousness (Post- Awake and Alert procedure): Post Debridement Measurements of Total Wound Length: (cm) 0.8 Width: (cm) 0.3 Depth: (cm) 0.2 Volume: (cm) 0.038 Character of Wound/Ulcer Post Debridement: Stable Severity of Tissue Post Debridement: Fat layer exposed Post Procedure Diagnosis Same as Pre-procedure Notes Procedure scribed for Dr. Heber [place] by Lorrin Jackson, RN Electronic Signature(s) Signed: 10/28/2021 12:56:03 PM By: [name] DO Signed: 10/28/2021 5:18:00 PM By: Lorrin Jackson Entered By: Lorrin Jackson on 10/28/2021 10:58:16 -------------------------------------------------------------------------------- HPI Details Patient Name: Date of Service: [name], [name] 10/28/2021 10:15 A M Medical Record Number: [id number] Patient Account Number: [id number] Date of Birth/Sex: Treating RN: Dec 28, 1950 (71 y.o. [name] Primary Care Provider: [name] Other Clinician: Referring Provider: Treating Provider/Extender: [name] in Treatment: 1 History of Present Illness HPI Description: 05/10/17 patient presents today for initial evaluation and our clinic concerning issues that she has been having with an ulceration on the plantar aspect of the left first metatarsal head. She has been seen a podiatrist Dr. Jacqualyn Posey for this since August 2018. Subsequently he has recommended bunion surgery as that seems to be the culprit for why there is pressure and friction occurring at the site causing the callous and subsequently the wound. 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 With that being said I do have notes from several of his visits one of which does include an  x-ray documentation stating that she had a negative x-ray. There was obviously the bunion the notes I have extended from 11 deformity. With that being said she has not had an MRI fortunately this wound does not probe to bone. The notes I have extended from February 08, 2017 through April 14, 2017. During that course she was also placed  on antibiotics to help with an infection two weeks ago and this was doxycycline. Fortunately that seems to have completely resolved any infection issues that she may have had. Unfortunately this is an ulcer that she has actually been dealing with since April 2018. She just initially was trying to treat it and care for it on her own. Her most recent hemoglobin A1c was 5.8, she is a non-smoker, she does have a right first toe amputation and a left third toe amputation. Fortunately she's not having any discomfort at the site. 05/17/17 on evaluation today patient's ulcer on the plantar aspect of the first metatarsal region appears to be close at this point. There does not appear to be any evidence of ulcer opening. This is on initial inspection. She has not really had any discomfort but honestly she really was not experiencing a lot of pain even before. There is no evidence of infection or fluctuation under the callous which is present at this point. ADMISSION 11/12/2019 This is a 71 year old woman that we previously had in this clinic in 2017 for two visits with a wound I think roughly in the same area. At that point she had a bunion in the left. She has apparently had surgery on this since then. Looking through care everywhere we can see that she has had follow-up with podiatry from 06/12/2019 through 10/28/2019 for a wound on the plantar aspect of her left first MTP. She states when this started she was having severe knee pain on the right which forced her to alter her gait. She has recently received doxycycline which she is finished and Bactroban. She is back to using  Medihoney on the wound. She is offloading with the Pegasys shoe. Past medical history includes type 2 diabetes with a recent hemoglobin A1c of 6.8, severe type II diabetic neuropathy, history of a CVA, stage III chronic renal failure hypertension, depression, of bilateral previous bunion surgery, right first toe amputation and a partial left third toe amputation for osteomyelitis. The patient states that she recently had work to do in her mother's house in [place] and so she was on her feet quite a lot which delayed the healing. ABI in our clinic was 1.1 on the left 8/31; plantar aspect of her left first metatarsal head. Using silver collagen. This is probably a surgical wound 9/14; this is a patient who had a wound on the plantar aspect of her left first metatarsal head in the setting of a significant bunion deformity. We use cervical collagen and a offloading shoe. She has significant diabetic neuropathy probably some degree of a Charcot deformity in her feet. This is closed over today. Readmission 03/30/2021 Ms. Kieana Livesay is a 71 year old female with a past medical history of insulin-dependent controlled type 2 diabetes s/p right great toe amputation, right knee osteoarthritis and hypertension that presents to the clinic for a second right toe wound. She states she was getting out of the bathtub when her foot got caught the tub and the skin to her second toe was split open. She noticed this after the bath when she saw blood on the floor. She has neuropathy in her feet. She visited the ED following the event on 03/26/2021. They obtained x-rays that showed a fracture to the fifth toe but no fracture to the second toe. She was started on doxycycline. She currently denies signs of infection. She has been keeping the area covered with a Band-Aid. Readmission 10/21/2021 Ms. Kaja Badertscher is a 72 year old female with  a past medical history of insulin dependent controlled type 2 diabetes complicated by  peripheral neuropathy that presents to the clinic for a left met head wound. She has been following with Dr. Earleen Newport, podiatry for over a year for this issue. She has tried collagen, mupirocin ointment and Medihoney. She is at high fall risk and cannot tolerate a total contact cast. She has orthotics with specialized inserts for her current issue. She currently denies signs of infection. 8/10; patient presents for follow-up. Unfortunately Regranex was unaffordable at $1200. She has been using Medihoney to the wound bed. She reports aggressively offloading the area. She has offloading inserts. She denies signs of infection. Electronic Signature(s) Signed: 10/28/2021 12:56:03 PM By: [name] DO Entered By: [name] on 10/28/2021 11:13:33 -------------------------------------------------------------------------------- Physical Exam Details Patient Name: Date of Service: Oswaldo Conroy RA H A. 10/28/2021 10:15 A M Medical Record Number: [id number] Patient Account Number: [id number] Date of Birth/Sex: Treating RN: 11-28-1950 (71 y.o. [name] Primary Care Provider: [name] Other Clinician: Referring Provider: Treating Provider/Extender: [name] Weeks in Treatment: 1 Constitutional respirations regular, non-labored and within target range for patient.. Cardiovascular 2+ dorsalis pedis/posterior tibialis pulses. Psychiatric pleasant and cooperative. Notes Left foot: T the first submetatarsal there is an open wound with callus, nonviable tissue and granulation tissue present. o Electronic Signature(s) Signed: 10/28/2021 12:56:03 PM By: [name] DO Entered By: [name] on 10/28/2021 11:15:01 -------------------------------------------------------------------------------- Physician Orders Details Patient Name: Date of Service: [name], [name] 10/28/2021 10:15 A M Medical Record Number: [id number] Patient Account Number:  [id number] Date of Birth/Sex: Treating RN: 1950/05/08 (71 y.o. [name] Primary Care Provider: [name] Other Clinician: Referring Provider: Treating Provider/Extender: [name] in Treatment: 1 Verbal / Phone Orders: No Diagnosis Coding ICD-10 Coding Code Description [id number] Non-pressure chronic ulcer of other part of left foot with fat layer exposed E11.621 Type 2 diabetes mellitus with foot ulcer E11.42 Type 2 diabetes mellitus with diabetic polyneuropathy Follow-up Appointments ppointment in 2 weeks. - Tuesday 11/09/21 @ 12:30pm with Dr. Heber [place] and Leveda Anna, RN (Room 7) Return A Anesthetic Topical Lidocaine 5% added to wound bed Topical Lidocaine 4% added to wound bed Bathing/ Shower/ Hygiene May shower and wash wound with soap and water. Edema Control - Lymphedema / SCD / Other Avoid standing for long periods of time. Off-Loading Other: - Diabetic Shoes Relieve pressure/time standing of feet Additional Orders / Instructions Follow Nutritious Diet - Monitor/Control Blood Sugars Wound Treatment Wound #4 - Metatarsal head first Wound Laterality: Plantar, Left Cleanser: Soap and Water 1 x Per Day/30 Days Discharge Instructions: May shower and wash wound with dial antibacterial soap and water prior to dressing change. Peri-Wound Care: Skin Prep (Generic) 1 x Per Day/30 Days Discharge Instructions: Use skin prep as directed Prim Dressing: MediHoney Gel, tube 1.5 (oz) 1 x Per Day/30 Days ary Discharge Instructions: Apply until Regranex obtained Prim Dressing: Optifoam Non-Adhesive Dressing, 4x4 in 1 x Per Day/30 Days ary Discharge Instructions: Apply as donut around wound Secondary Dressing: ALLEVYN Gentle Border, 3x3 (in/in) (Generic) 1 x Per Day/30 Days Discharge Instructions: Apply over primary dressing as directed. Electronic Signature(s) Signed: 10/28/2021 12:56:03 PM By: [name] DO Entered By: [name] on  10/28/2021 11:15:11 -------------------------------------------------------------------------------- Problem List Details Patient Name: Date of Service: [name], [name] 10/28/2021 10:15 A M Medical Record Number: [id number] Patient Account Number: [id number] Date of Birth/Sex: Treating RN: 1950/06/04 (71 y.o. [name]  Primary Care Provider: [name] Other Clinician: Referring Provider: Treating Provider/Extender: [name] in Treatment: 1 Active Problems ICD-10 Encounter Code Description Active Date MDM Diagnosis L97.522 Non-pressure chronic ulcer of other part of left foot with fat layer exposed 10/21/2021 No Yes E11.621 Type 2 diabetes mellitus with foot ulcer 10/21/2021 No Yes E11.42 Type 2 diabetes mellitus with diabetic polyneuropathy 10/21/2021 No Yes Inactive Problems Resolved Problems Electronic Signature(s) Signed: 10/28/2021 12:56:03 PM By: [name] DO Signed: 10/28/2021 12:56:03 PM By: [name] DO Entered By: [name] on 10/28/2021 11:12:20 -------------------------------------------------------------------------------- Progress Note Details Patient Name: Date of Service: [name], [name] 10/28/2021 10:15 A M Medical Record Number: [id number] Patient Account Number: [id number] Date of Birth/Sex: Treating RN: 03/01/1951 (71 y.o. [name] Primary Care Provider: [name] Other Clinician: Referring Provider: Treating Provider/Extender: [name] in Treatment: 1 Subjective Chief Complaint Information obtained from Patient Left plantar ulcer 11/12/2019; patient is here referred by podiatry for review of a wound on the left first plantar metatarsal head 03/30/2021; right second toe wound s/p Trauma History of Present Illness (HPI) 05/10/17 patient presents today for initial evaluation and our clinic concerning issues that she has been having with an ulceration on  the plantar aspect of the left first metatarsal head. She has been seen a podiatrist Dr. Jacqualyn Posey for this since August 2018. Subsequently he has recommended bunion surgery as that seems to be the culprit for why there is pressure and friction occurring at the site causing the callous and subsequently the wound. 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 With that being said I do have notes from several of his visits one of which does include an x-ray documentation stating that she had a negative x-ray. There was obviously the bunion the notes I have extended from 11 deformity. With that being said she has not had an MRI fortunately this wound does not probe to bone. The notes I have extended from February 08, 2017 through April 14, 2017. During that course she was also placed on antibiotics to help with an infection two weeks ago and this was doxycycline. Fortunately that seems to have completely resolved any infection issues that she may have had. Unfortunately this is an ulcer that she has actually been dealing with since April 2018. She just initially was trying to treat it and care for it on her own. Her most recent hemoglobin A1c was 5.8, she is a non-smoker, she does have a right first toe amputation and a left third toe amputation. Fortunately she's not having any discomfort at the site. 05/17/17 on evaluation today patient's ulcer on the plantar aspect of the first metatarsal region appears to be close at this point. There does not appear to be any evidence of ulcer opening. This is on initial inspection. She has not really had any discomfort but honestly she really was not experiencing a lot of pain even before. There is no evidence of infection or fluctuation under the callous which is present at this point. ADMISSION 11/12/2019 This is a 71 year old  woman that we previously had in this clinic in 2017 for two visits with a wound I think roughly in the same area. At that point she had a bunion in the left. She has apparently had surgery on this since then. Looking through care  everywhere we can see that she has had follow-up with podiatry from 06/12/2019 through 10/28/2019 for a wound on the plantar aspect of her left first MTP. She states when this started she was having severe knee pain on the right which forced her to alter her gait. She has recently received doxycycline which she is finished and Bactroban. She is back to using Medihoney on the wound. She is offloading with the Pegasys shoe. Past medical history includes type 2 diabetes with a recent hemoglobin A1c of 6.8, severe type II diabetic neuropathy, history of a CVA, stage III chronic renal failure hypertension, depression, of bilateral previous bunion surgery, right first toe amputation and a partial left third toe amputation for osteomyelitis. The patient states that she recently had work to do in her mother's house in [place] and so she was on her feet quite a lot which delayed the healing. ABI in our clinic was 1.1 on the left 8/31; plantar aspect of her left first metatarsal head. Using silver collagen. This is probably a surgical wound 9/14; this is a patient who had a wound on the plantar aspect of her left first metatarsal head in the setting of a significant bunion deformity. We use cervical collagen and a offloading shoe. She has significant diabetic neuropathy probably some degree of a Charcot deformity in her feet. This is closed over today. Readmission 03/30/2021 Ms. Sally-Ann Cutbirth is a 71 year old female with a past medical history of insulin-dependent controlled type 2 diabetes s/p right great toe amputation, right knee osteoarthritis and hypertension that presents to the clinic for a second right toe wound. She states she was getting out of the bathtub when her foot got  caught the tub and the skin to her second toe was split open. She noticed this after the bath when she saw blood on the floor. She has neuropathy in her feet. She visited the ED following the event on 03/26/2021. They obtained x-rays that showed a fracture to the fifth toe but no fracture to the second toe. She was started on doxycycline. She currently denies signs of infection. She has been keeping the area covered with a Band-Aid. Readmission 10/21/2021 Ms. Shaneeka Foss is a 71 year old female with a past medical history of insulin dependent controlled type 2 diabetes complicated by peripheral neuropathy that presents to the clinic for a left met head wound. She has been following with Dr. Earleen Newport, podiatry for over a year for this issue. She has tried collagen, mupirocin ointment and Medihoney. She is at high fall risk and cannot tolerate a total contact cast. She has orthotics with specialized inserts for her current issue. She currently denies signs of infection. 8/10; patient presents for follow-up. Unfortunately Regranex was unaffordable at $1200. She has been using Medihoney to the wound bed. She reports aggressively offloading the area. She has offloading inserts. She denies signs of infection. Patient History Information obtained from Patient. Family History Cancer - Father, Hypertension - Mother,Father,Siblings, Stroke - Mother,Father, No family history of Diabetes, Heart Disease, Hereditary Spherocytosis, Kidney Disease, Lung Disease, Seizures, Thyroid Problems, Tuberculosis. Social History Never smoker, Marital Status - Single, Alcohol Use - Never, Drug Use - No History, Caffeine Use - Rarely. Medical History Eyes Patient has history of Cataracts Denies history of Glaucoma, Optic Neuritis Ear/Nose/Mouth/Throat Denies history of Chronic sinus problems/congestion, Middle ear problems Hematologic/Lymphatic Denies history of Anemia, Hemophilia, Human Immunodeficiency Virus, Lymphedema,  Sickle Cell Disease Respiratory Patient has history of Sleep Apnea Denies history of Aspiration, Asthma, Chronic  Obstructive Pulmonary Disease (COPD), Pneumothorax, Tuberculosis Cardiovascular Patient has history of Hypertension Denies history of Angina, Arrhythmia, Congestive Heart Failure, Coronary Artery Disease, Deep Vein Thrombosis, Hypotension, Myocardial Infarction, Peripheral Arterial Disease, Peripheral Venous Disease, Phlebitis, Vasculitis Gastrointestinal Denies history of Cirrhosis , Colitis, Crohnoos, Hepatitis A, Hepatitis B, Hepatitis C Endocrine Patient has history of Type II Diabetes Denies history of Type I Diabetes Genitourinary Denies history of End Stage Renal Disease Immunological Denies history of Lupus Erythematosus, Raynaudoos, Scleroderma Integumentary (Skin) Denies history of History of Burn Musculoskeletal Patient has history of Osteoarthritis, Osteomyelitis - Hx in 2007 (left 3rd toe) Denies history of Gout, Rheumatoid Arthritis Neurologic Patient has history of Neuropathy Denies history of Dementia, Quadriplegia, Paraplegia, Seizure Disorder Oncologic Patient has history of Received Radiation - 03/2020 Denies history of Received Chemotherapy Psychiatric Patient has history of Confinement Anxiety Denies history of Anorexia/bulimia Hospitalization/Surgery History - Amputation of right great toe. - Tendon repair. - Hysterectomy. - Osteomyelitis left 3rd toe. - cervical fusion with cage. - left breast lumpectomy. Medical A Surgical History Notes nd Constitutional Symptoms (General Health) morbid obesity Cardiovascular Takotsubo cardiomyopathy 2012, hyperlipidemia, Hx CVA, Tachycardia Genitourinary CKD stage 3 Musculoskeletal left leg weakness Neurologic CVA Oncologic hx endometrial cancer, left breast CA Psychiatric depression Objective Constitutional respirations regular, non-labored and within target range for patient.. Vitals Time  Taken: 10:40 AM, Temperature: 97.7 F, Pulse: 78 bpm, Respiratory Rate: 18 breaths/min, Blood Pressure: 98/63 mmHg, Capillary Blood Glucose: 163 mg/dl. Cardiovascular 2+ dorsalis pedis/posterior tibialis pulses. Psychiatric pleasant and cooperative. General Notes: Left foot: T the first submetatarsal there is an open wound with callus, nonviable tissue and granulation tissue present. o Integumentary (Hair, Skin) Wound #4 status is Open. Original cause of wound was Gradually Appeared. The date acquired was: 10/02/2020. The wound has been in treatment 1 weeks. The wound is located on the Left,Plantar Metatarsal head first. The wound measures 0.8cm length x 0.3cm width x 0.2cm depth; 0.188cm^2 area and 0.038cm^3 volume. There is Fat Layer (Subcutaneous Tissue) exposed. There is no tunneling or undermining noted. There is a medium amount of serosanguineous drainage noted. The wound margin is distinct with the outline attached to the wound base. There is large (67-100%) red granulation within the wound bed. There is a small (1-33%) amount of necrotic tissue within the wound bed including Adherent Slough. Assessment Active Problems ICD-10 Non-pressure chronic ulcer of other part of left foot with fat layer exposed Type 2 diabetes mellitus with foot ulcer Type 2 diabetes mellitus with diabetic polyneuropathy Patient's wound has shown improvement in size in appearance since last clinic visit. I debrided nonviable tissue. I think she is doing well with Medihoney and I recommended continuing this. Continue aggressive offloading. Follow-up in 2 weeks. Procedures Wound #4 Pre-procedure diagnosis of Wound #4 is a Diabetic Wound/Ulcer of the Lower Extremity located on the Left,Plantar Metatarsal head first .Severity of Tissue Pre Debridement is: Fat layer exposed. There was a Excisional Skin/Subcutaneous Tissue Debridement with a total area of 0.24 sq cm performed by [name], DO. With the  following instrument(s): Curette to remove Non-Viable tissue/material. Material removed includes Callus, Subcutaneous Tissue, and Slough after achieving pain control using Lidocaine 4% Topical Solution. No specimens were taken. A time out was conducted at 10:51, prior to the start of the procedure. A Minimum amount of bleeding was controlled with Pressure. The procedure was tolerated well. Post Debridement Measurements: 0.8cm length x 0.3cm width x 0.2cm depth; 0.038cm^3 volume. Character of Wound/Ulcer Post Debridement is stable. Severity of Tissue  Post Debridement is: Fat layer exposed. Post procedure Diagnosis Wound #4: Same as Pre-Procedure General Notes: Procedure scribed for Dr. Heber [place] by Lorrin Jackson, RN. Plan Follow-up Appointments: Return Appointment in 2 weeks. - Tuesday 11/09/21 @ 12:30pm with Dr. Heber  and Leveda Anna, RN (Room 7) Anesthetic: Topical Lidocaine 5% added to wound bed Topical Lidocaine 4% added to wound bed Bathing/ Shower/ Hygiene: May shower and wash wound with soap and water. Edema Control - Lymphedema / SCD / Other: Avoid standing for long periods of time. Off-Loading: Other: - Diabetic Shoes Relieve pressure/time standing of feet Additional Orders / Instructions: Follow Nutritious Diet - Monitor/Control Blood Sugars WOUND #4: - Metatarsal head first Wound Laterality: Plantar, Left Cleanser: Soap and Water 1 x Per Day/30 Days Discharge Instructions: May shower and wash wound with dial antibacterial soap and water prior to dressing change. Peri-Wound Care: Skin Prep (Generic) 1 x Per Day/30 Days Discharge Instructions: Use skin prep as directed Prim Dressing: MediHoney Gel, tube 1.5 (oz) 1 x Per Day/30 Days ary Discharge Instructions: Apply until Regranex obtained Prim Dressing: Optifoam Non-Adhesive Dressing, 4x4 in 1 x Per Day/30 Days ary Discharge Instructions: Apply as donut around wound Secondary Dressing: ALLEVYN Gentle Border, 3x3 (in/in) (Generic) 1 x  Per Day/30 Days Discharge Instructions: Apply over primary dressing as directed. 1. In office sharp debridement 2. Medihoney 3. Follow-up in 2 weeks Electronic Signature(s) Signed: 10/28/2021 12:56:03 PM By: [name] DO Entered By: [name] on 10/28/2021 11:15:49 -------------------------------------------------------------------------------- HxROS Details Patient Name: Date of Service: [name], [name] 10/28/2021 10:15 A M Medical Record Number: [id number] Patient Account Number: [id number] Date of Birth/Sex: Treating RN: 1950/06/06 (71 y.o. [name] Primary Care Provider: [name] Other Clinician: Referring Provider: Treating Provider/Extender: [name] in Treatment: 1 Information Obtained From Patient Constitutional Symptoms (General Health) Medical History: Past Medical History Notes: morbid obesity Eyes Medical History: Positive for: Cataracts Negative for: Glaucoma; Optic Neuritis Ear/Nose/Mouth/Throat Medical History: Negative for: Chronic sinus problems/congestion; Middle ear problems Hematologic/Lymphatic Medical History: Negative for: Anemia; Hemophilia; Human Immunodeficiency Virus; Lymphedema; Sickle Cell Disease Respiratory Medical History: Positive for: Sleep Apnea Negative for: Aspiration; Asthma; Chronic Obstructive Pulmonary Disease (COPD); Pneumothorax; Tuberculosis Cardiovascular Medical History: Positive for: Hypertension Negative for: Angina; Arrhythmia; Congestive Heart Failure; Coronary Artery Disease; Deep Vein Thrombosis; Hypotension; Myocardial Infarction; Peripheral Arterial Disease; Peripheral Venous Disease; Phlebitis; Vasculitis Past Medical History Notes: Takotsubo cardiomyopathy 2012, hyperlipidemia, Hx CVA, Tachycardia Gastrointestinal Medical History: Negative for: Cirrhosis ; Colitis; Crohns; Hepatitis A; Hepatitis B; Hepatitis C Endocrine Medical History: Positive  for: Type II Diabetes Negative for: Type I Diabetes Time with diabetes: over 20 years Treated with: Insulin, Oral agents Blood sugar tested every day: Yes Tested : daily Genitourinary Medical History: Negative for: End Stage Renal Disease Past Medical History Notes: CKD stage 3 Immunological Medical History: Negative for: Lupus Erythematosus; Raynauds; Scleroderma Integumentary (Skin) Medical History: Negative for: History of Burn Musculoskeletal Medical History: Positive for: Osteoarthritis; Osteomyelitis - Hx in 2007 (left 3rd toe) Negative for: Gout; Rheumatoid Arthritis Past Medical History Notes: left leg weakness Neurologic Medical History: Positive for: Neuropathy Negative for: Dementia; Quadriplegia; Paraplegia; Seizure Disorder Past Medical History Notes: CVA Oncologic Medical History: Positive for: Received Radiation - 03/2020 Negative for: Received Chemotherapy Past Medical History Notes: hx endometrial cancer, left breast CA Psychiatric Medical History: Positive for: Confinement Anxiety Negative for: Anorexia/bulimia Past Medical History Notes: depression HBO Extended History Items Eyes: Cataracts Immunizations Pneumococcal Vaccine: Received Pneumococcal Vaccination: Yes Received Pneumococcal Vaccination  On or After 60th Birthday: Yes Implantable Devices None Hospitalization / Surgery History Type of Hospitalization/Surgery Amputation of right great toe Tendon repair Hysterectomy Osteomyelitis left 3rd toe cervical fusion with cage left breast lumpectomy Family and Social History Cancer: Yes - Father; Diabetes: No; Heart Disease: No; Hereditary Spherocytosis: No; Hypertension: Yes - Mother,Father,Siblings; Kidney Disease: No; Lung Disease: No; Seizures: No; Stroke: Yes - Mother,Father; Thyroid Problems: No; Tuberculosis: No; Never smoker; Marital Status - Single; Alcohol Use: Never; Drug Use: No History; Caffeine Use: Rarely; Financial Concerns:  No; Food, Clothing or Shelter Needs: No; Support System Lacking: No; Transportation Concerns: No Electronic Signature(s) Signed: 10/28/2021 12:56:03 PM By: [name] DO Signed: 10/28/2021 5:18:00 PM By: Lorrin Jackson Entered By: [name] on 10/28/2021 11:13:39 -------------------------------------------------------------------------------- SuperBill Details Patient Name: Date of Service: [name], [name] 10/28/2021 Medical Record Number: [id number] Patient Account Number: [id number] Date of Birth/Sex: Treating RN: 20-Jun-1950 (71 y.o. [name] Primary Care Provider: [name] Other Clinician: Referring Provider: Treating Provider/Extender: [name] Weeks in Treatment: 1 Diagnosis Coding ICD-10 Codes Code Description [phone] Non-pressure chronic ulcer of other part of left foot with fat layer exposed E11.621 Type 2 diabetes mellitus with foot ulcer E11.42 Type 2 diabetes mellitus with diabetic polyneuropathy Facility Procedures CPT4 Code: [id number] Description: 70230 - DEB SUBQ TISSUE 20 SQ CM/< ICD-10 Diagnosis Description L97.522 Non-pressure chronic ulcer of other part of left foot with fat layer exposed Modifier: Quantity: 1 Physician Procedures : CPT4 Code Description Modifier [id number] - WC PHYS SUBQ TISS 20 SQ CM ICD-10 Diagnosis Description L97.522 Non-pressure chronic ulcer of other part of left foot with fat layer exposed Quantity: 1 Electronic Signature(s) Signed: 10/28/2021 12:56:03 PM By: [name] DO Entered By: [name] on 10/28/2021 11:15:57

## 2021-10-28 NOTE — Progress Notes (Signed)
RESHUNDA, STRIDER ([id number]) Visit Report for 10/28/2021 Arrival Information Details Patient Name: Date of Service: [name] 10/28/2021 10:15 A M Medical Record Number: [id number] Patient Account Number: [id number] Date of Birth/Sex: Treating RN: [birth year] (71 y.o. [name] Primary Care [name]: [name] Other Clinician: Referring [name]: Treating [name]/Extender: [name] in Treatment: 1 Visit Information History Since Last Visit Added or deleted any medications: No Patient Arrived: Ambulatory Any new allergies or adverse reactions: No Arrival Time: 10:36 Had a fall or experienced change in No Transfer Assistance: None activities of daily living that may affect Patient Identification Verified: Yes risk of falls: Secondary Verification Process Completed: Yes Signs or symptoms of abuse/neglect since last visito No Patient Requires Transmission-Based Precautions: No Hospitalized since last visit: No Patient Has Alerts: Yes Implantable device outside of the clinic excluding No Patient Alerts: ABI's R=1.07 L=1.02 cellular tissue based products placed in the center since last visit: Has Dressing in Place as Prescribed: Yes Pain Present Now: No Electronic Signature(s) Signed: 10/28/2021 5:18:00 PM By: [name] Entered By: [name] on 10/28/2021 10:40:13 -------------------------------------------------------------------------------- Encounter Discharge Information Details Patient Name: Date of Service: [name], Holly RA H A. 10/28/2021 10:15 A M Medical Record Number: [id number] Patient Account Number: [id number] Date of Birth/Sex: Treating RN: 12/23/1950 (71 y.o. [name] Primary Care [name]: [name] Other Clinician: Referring [name]: Treating [name] in Treatment: 1 Encounter Discharge Information Items Post Procedure Vitals Discharge  Condition: Stable Temperature (F): 97.7 Ambulatory Status: Ambulatory Pulse (bpm): 78 Discharge Destination: Home Respiratory Rate (breaths/min): 18 Transportation: Private Auto Blood Pressure (mmHg): 98/63 Schedule Follow-up Appointment: Yes Clinical Summary of Care: Provided on 10/28/2021 Form Type Recipient Paper Patient Patient Electronic Signature(s) Signed: 10/28/2021 5:18:00 PM By: [name] Entered By: [name] on 10/28/2021 11:16:02 -------------------------------------------------------------------------------- Lower Extremity Assessment Details Patient Name: Date of Service: [name] RA H A. 10/28/2021 10:15 A M Medical Record Number: [id number] Patient Account Number: [id number] Date of Birth/Sex: Treating RN: 11-Mar-1951 (71 y.o. [name] Primary Care Orell Hurtado: [name] Other Clinician: Referring [name]: Treating [name]/Extender: [name] Weeks in Treatment: 1 Edema Assessment Assessed: [Left: Yes] [Right: No] E[Left: dema] [Right: :] Calf Left: Right: Point of Measurement: 34 cm From Medial Instep 48 cm Ankle Left: Right: Point of Measurement: 9 cm From Medial Instep 24 cm Knee To Floor Left: Right: From Medial Instep 43 cm Vascular Assessment Pulses: Dorsalis Pedis Palpable: [Left:Yes] Electronic Signature(s) Signed: 10/28/2021 5:18:00 PM By: [name] Entered By: [name] on 10/28/2021 10:46:58 -------------------------------------------------------------------------------- Multi Wound Chart Details Patient Name: Date of Service: [name], Holly RA H A. 10/28/2021 10:15 A M Medical Record Number: [id number] Patient Account Number: [id number] Date of Birth/Sex: Treating RN: [birth year] (71 y.o. [name] Primary Care [name]: [name] Other Clinician: Referring [name]: Treating [name]/Extender: [name] Weeks in Treatment: 1 Vital  Signs Height(in): Capillary Blood Glucose(mg/dl): 163 Weight(lbs): Pulse(bpm): 57 Body Mass Index(BMI): Blood Pressure(mmHg): 98/63 Temperature(F): 97.7 Respiratory Rate(breaths/min): 18 Photos: [4:Left, Plantar Metatarsal head first] [N/A:N/A N/A] Wound Location: [4:Gradually Appeared] [N/A:N/A] Wounding Event: [4:Diabetic Wound/Ulcer of the Lower] [N/A:N/A] Primary Etiology: [4:Extremity Cataracts, Sleep Apnea, Hypertension, N/A] Comorbid History: [4:Type II Diabetes, Osteoarthritis, Osteomyelitis, Neuropathy, Received Radiation, Confinement Anxiety 10/02/2020] [N/A:N/A] Date Acquired: [4:1] [N/A:N/A] Weeks of Treatment: [4:Open] [N/A:N/A] Wound Status: [4:No] [N/A:N/A] Wound Recurrence: [4:0.8x0.3x0.2] [N/A:N/A] Measurements L x W x D (cm) [4:0.188] [N/A:N/A] A (cm) : rea [4:0.038] [  N/A:N/A] Volume (cm) : [4:53.90%] [N/A:N/A] % Reduction in A [4:rea: 69.10%] [N/A:N/A] % Reduction in Volume: [4:Grade 1] [N/A:N/A] Classification: [4:Medium] [N/A:N/A] Exudate A mount: [4:Serosanguineous] [N/A:N/A] Exudate Type: [4:red, brown] [N/A:N/A] Exudate Color: [4:Distinct, outline attached] [N/A:N/A] Wound Margin: [4:Large (67-100%)] [N/A:N/A] Granulation A mount: [4:Red] [N/A:N/A] Granulation Quality: [4:Small (1-33%)] [N/A:N/A] Necrotic A mount: [4:Fat Layer (Subcutaneous Tissue): Yes N/A] Exposed Structures: [4:Fascia: No Tendon: No Muscle: No Joint: No Bone: No Medium (34-66%)] [N/A:N/A] Epithelialization: [4:Debridement - Excisional] [N/A:N/A] Debridement: Pre-procedure Verification/Time Out 10:51 [N/A:N/A] Taken: [4:Lidocaine 4% Topical Solution] [N/A:N/A] Pain Control: [4:Callus, Subcutaneous, Slough] [N/A:N/A] Tissue Debrided: [4:Skin/Subcutaneous Tissue] [N/A:N/A] Level: [4:0.24] [N/A:N/A] Debridement A (sq cm): [4:rea Curette] [N/A:N/A] Instrument: [4:Minimum] [N/A:N/A] Bleeding: [4:Pressure] [N/A:N/A] Hemostasis A chieved: [4:Procedure was tolerated well]  [N/A:N/A] Debridement Treatment Response: [4:0.8x0.3x0.2] [N/A:N/A] Post Debridement Measurements L x W x D (cm) [4:0.038] [N/A:N/A] Post Debridement Volume: (cm) [4:Debridement] [N/A:N/A] Treatment Notes Electronic Signature(s) Signed: 10/28/2021 12:56:03 PM By: Holly Shan DO Signed: 10/28/2021 5:18:00 PM By: [name] Entered By: [name] on 10/28/2021 11:12:40 -------------------------------------------------------------------------------- Multi-Disciplinary Care Plan Details Patient Name: Date of Service: [name], Holly Dess RA H A. 10/28/2021 10:15 A M Medical Record Number: [id number] Patient Account Number: [id number] Date of Birth/Sex: Treating RN: 08/17/1950 (71 y.o. [name] Primary Care Dryden Tapley: [name] Other Clinician: Referring Jaima Janney: Treating Destanie Tibbetts/Extender: [name] in Treatment: 1 Active Inactive Nutrition Nursing Diagnoses: Impaired glucose control: actual or potential Goals: Patient/caregiver verbalizes understanding of need to maintain therapeutic glucose control per primary care physician Date Initiated: 10/21/2021 Target Resolution Date: 11/18/2021 Goal Status: Active Interventions: Assess HgA1c results as ordered upon admission and as needed Provide education on elevated blood sugars and impact on wound healing Notes: Wound/Skin Impairment Nursing Diagnoses: Impaired tissue integrity Goals: Patient/caregiver will verbalize understanding of skin care regimen Date Initiated: 10/21/2021 Target Resolution Date: 11/18/2021 Goal Status: Active Ulcer/skin breakdown will have a volume reduction of 30% by week 4 Date Initiated: 10/21/2021 Target Resolution Date: 11/18/2021 Goal Status: Active Interventions: Assess patient/caregiver ability to obtain necessary supplies Assess patient/caregiver ability to perform ulcer/skin care regimen upon admission and as needed Assess ulceration(s) every  visit Provide education on ulcer and skin care Treatment Activities: Topical wound management initiated : 10/21/2021 Notes: Electronic Signature(s) Signed: 10/28/2021 5:18:00 PM By: [name] Entered By: [name] on 10/28/2021 10:36:01 -------------------------------------------------------------------------------- Pain Assessment Details Patient Name: Date of Service: [name], Holly Dess RA H A. 10/28/2021 10:15 A M Medical Record Number: [id number] Patient Account Number: [id number] Date of Birth/Sex: Treating RN: 05/21/1950 (71 y.o. [name] Primary Care Asra Gambrel: [name] Other Clinician: Referring Danne Scardina: Treating Shirl Weir/Extender: [name] Weeks in Treatment: 1 Active Problems Location of Pain Severity and Description of Pain Patient Has Paino No Site Locations Pain Management and Medication Current Pain Management: Electronic Signature(s) Signed: 10/28/2021 5:18:00 PM By: [name] Entered By: [name] on 10/28/2021 10:41:03 -------------------------------------------------------------------------------- Patient/Caregiver Education Details Patient Name: Date of Service: [name] RA H A. 8/10/2023andnbsp10:15 A M Medical Record Number: [id number] Patient Account Number: [id number] Date of Birth/Gender: Treating RN: 1950/12/31 (71 y.o. [name] Primary Care Physician: [name] Other Clinician: Referring Physician: Treating Physician/Extender: [name] in Treatment: 1 Education Assessment Education Provided To: Patient Education Topics Provided Elevated Blood Sugar/ Impact on Healing: Methods: Explain/Verbal, Printed Responses: State content correctly Offloading: Methods: Explain/Verbal, Printed Responses: State content correctly Wound/Skin Impairment: Methods: Explain/Verbal, Printed Responses: State content correctly Electronic Signature(s) Signed:  10/28/2021 5:18:00 PM  By: [name] Entered By: [name] on 10/28/2021 10:36:34 -------------------------------------------------------------------------------- Wound Assessment Details Patient Name: Date of Service: [name], Holly RA H A. 10/28/2021 10:15 A M Medical Record Number: [id number] Patient Account Number: [id number] Date of Birth/Sex: Treating RN: 06-Oct-1950 (71 y.o. [name] Primary Care Bernadette Armijo: [name] Other Clinician: Referring Calista Crain: Treating Neira Bentsen/Extender: [name] Weeks in Treatment: 1 Wound Status Wound Number: 4 Primary Diabetic Wound/Ulcer of the Lower Extremity Etiology: Wound Location: Left, Plantar Metatarsal head first Wound Open Wounding Event: Gradually Appeared Status: Date Acquired: 10/02/2020 Comorbid Cataracts, Sleep Apnea, Hypertension, Type II Diabetes, Weeks Of Treatment: 1 History: Osteoarthritis, Osteomyelitis, Neuropathy, Received Radiation, Clustered Wound: No Confinement Anxiety Photos Wound Measurements Length: (cm) 0.8 Width: (cm) 0.3 Depth: (cm) 0.2 Area: (cm) 0.188 Volume: (cm) 0.038 % Reduction in Area: 53.9% % Reduction in Volume: 69.1% Epithelialization: Medium (34-66%) Tunneling: No Undermining: No Wound Description Classification: Grade 1 Wound Margin: Distinct, outline attached Exudate Amount: Medium Exudate Type: Serosanguineous Exudate Color: red, brown Foul Odor After Cleansing: No Slough/Fibrino Yes Wound Bed Granulation Amount: Large (67-100%) Exposed Structure Granulation Quality: Red Fascia Exposed: No Necrotic Amount: Small (1-33%) Fat Layer (Subcutaneous Tissue) Exposed: Yes Necrotic Quality: Adherent Slough Tendon Exposed: No Muscle Exposed: No Joint Exposed: No Bone Exposed: No Treatment Notes Wound #4 (Metatarsal head first) Wound Laterality: Plantar, Left Cleanser Soap and Water Discharge Instruction: May shower and wash wound with dial  antibacterial soap and water prior to dressing change. Peri-Wound Care Skin Prep Discharge Instruction: Use skin prep as directed Topical Primary Dressing MediHoney Gel, tube 1.5 (oz) Discharge Instruction: Apply until Regranex obtained Optifoam Non-Adhesive Dressing, 4x4 in Discharge Instruction: Apply as donut around wound Secondary Dressing ALLEVYN Gentle Border, 3x3 (in/in) Discharge Instruction: Apply over primary dressing as directed. Secured With Compression Wrap Compression Stockings Environmental education officer) Signed: 10/28/2021 5:18:00 PM By: [name] Entered By: [name] on 10/28/2021 10:45:55 -------------------------------------------------------------------------------- Vitals Details Patient Name: Date of Service: [name], Holly RA H A. 10/28/2021 10:15 A M Medical Record Number: [id number] Patient Account Number: [id number] Date of Birth/Sex: Treating RN: 1950/04/09 (72 y.o. [name] Primary Care Bastien Strawser: [name] Other Clinician: Referring Cornelious Bartolucci: Treating Marciel Offenberger/Extender: [name] Weeks in Treatment: 1 Vital Signs Time Taken: 10:40 Temperature (F): 97.7 Pulse (bpm): 78 Respiratory Rate (breaths/min): 18 Blood Pressure (mmHg): 98/63 Capillary Blood Glucose (mg/dl): 163 Reference Range: 80 - 120 mg / dl Electronic Signature(s) Signed: 10/28/2021 5:18:00 PM By: [name] Entered By: [name] on 10/28/2021 10:40:57

## 2021-11-09 ENCOUNTER — Encounter (HOSPITAL_BASED_OUTPATIENT_CLINIC_OR_DEPARTMENT_OTHER): Payer: PPO | Admitting: Internal Medicine

## 2021-11-09 DIAGNOSIS — E1142 Type 2 diabetes mellitus with diabetic polyneuropathy: Secondary | ICD-10-CM | POA: Diagnosis not present

## 2021-11-09 DIAGNOSIS — E11621 Type 2 diabetes mellitus with foot ulcer: Secondary | ICD-10-CM | POA: Diagnosis not present

## 2021-11-09 DIAGNOSIS — L97522 Non-pressure chronic ulcer of other part of left foot with fat layer exposed: Secondary | ICD-10-CM | POA: Diagnosis not present

## 2021-11-09 NOTE — Progress Notes (Signed)
[name] ([id number]) Visit Report for 11/09/2021 Chief Complaint Document Details Patient Name: Date of Service: [name] 11/09/2021 12:30 PM Medical Record Number: [id number] Patient Account Number: [id number] Date of Birth/Sex: Treating RN: 1951/01/10 (71 y.o. [name] Primary Care Provider: Harlan [name] Other Clinician: Referring Provider: Treating Provider/Extender: [name] Weeks in Treatment: 2 Information Obtained from: Patient Chief Complaint Left plantar ulcer 11/12/2019; patient is here referred by podiatry for review of a wound on the left first plantar metatarsal head 03/30/2021; right second toe wound s/p Trauma Electronic Signature(s) Signed: 11/09/2021 3:51:23 PM By: Holly Shan DO Entered By: [name] on 11/09/2021 12:57:40 -------------------------------------------------------------------------------- HPI Details Patient Name: Date of Service: [name], Holly RA H A. 11/09/2021 12:30 PM Medical Record Number: [id number] Patient Account Number: [id number] Date of Birth/Sex: Treating RN: Nov 08, 1950 (71 y.o. [name] Primary Care Provider: Harlan [name] Other Clinician: Referring Provider: Treating Provider/Extender: [name] in Treatment: 2 History of Present Illness HPI Description: 05/10/17 patient presents today for initial evaluation and our clinic concerning issues that she has been having with an ulceration on the plantar aspect of the left first metatarsal head. She has been seen a podiatrist [name] for this since August 2018. Subsequently he has recommended bunion surgery as that seems to be the culprit for why there is pressure and friction occurring at the site causing the callous and subsequently the wound. 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 With that being said I do have notes from several of his visits one of which does include an x-ray documentation stating that she had a negative x-ray. There was obviously the bunion the notes I have extended from 11 deformity. With that being said she has not had an MRI fortunately this wound does not probe to bone. The notes I have extended from February 08, 2017 through April 14, 2017. During that course she was also placed on antibiotics to help with an infection two weeks ago and this was doxycycline. Fortunately that seems to have completely resolved any infection issues that she may have had. Unfortunately this is an ulcer that she has actually been dealing with since April 2018. She just initially was trying to treat it and care for it on her own. Her most recent hemoglobin A1c was 5.8, she is a non-smoker, she does have a right first toe amputation and a left third toe amputation. Fortunately she's not having any discomfort at the site. 05/17/17 on evaluation today patient's ulcer on the plantar aspect of the first metatarsal region appears to be close at this point. There does not appear to be any evidence of ulcer opening. This is on initial inspection. She has not really had any discomfort but honestly she really was not experiencing a lot of pain even before. There is no evidence of infection or fluctuation under the callous which is present at this point. ADMISSION 11/12/2019 This is a 71 year old woman that we previously had in this clinic in 2017 for two visits with a wound I think roughly in the same area. At that point she had a bunion in the left. She has apparently had surgery on this since then. Looking through care everywhere we can see that she has had follow-up with podiatry from 06/12/2019 through 10/28/2019 for a wound on  the plantar aspect of her left first MTP. She states when this started she was having severe knee pain on the right which  forced her to alter her gait. She has recently received doxycycline which she is finished and Bactroban. She is back to using Medihoney on the wound. She is offloading with the Pegasys shoe. Past medical history includes type 2 diabetes with a recent hemoglobin A1c of 6.8, severe type II diabetic neuropathy, history of a CVA, stage III chronic renal failure hypertension, depression, of bilateral previous bunion surgery, right first toe amputation and a partial left third toe amputation for osteomyelitis. The patient states that she recently had work to do in her mother's house in [place] and so she was on her feet quite a lot which delayed the healing. ABI in our clinic was 1.1 on the left 8/31; plantar aspect of her left first metatarsal head. Using silver collagen. This is probably a surgical wound 9/14; this is a patient who had a wound on the plantar aspect of her left first metatarsal head in the setting of a significant bunion deformity. We use cervical collagen and a offloading shoe. She has significant diabetic neuropathy probably some degree of a Charcot deformity in her feet. This is closed over today. Readmission 03/30/2021 [name] is a 71 year old female with a past medical history of insulin-dependent controlled type 2 diabetes s/p right great toe amputation, right knee osteoarthritis and hypertension that presents to the clinic for a second right toe wound. She states she was getting out of the bathtub when her foot got caught the tub and the skin to her second toe was split open. She noticed this after the bath when she saw blood on the floor. She has neuropathy in her feet. She visited the ED following the event on 03/26/2021. They obtained x-rays that showed a fracture to the fifth toe but no fracture to the second toe. She was started on doxycycline. She currently denies signs of infection. She has been keeping the area covered with a Band-Aid. Readmission 10/21/2021 Ms.  [name] is a 71 year old female with a past medical history of insulin dependent controlled type 2 diabetes complicated by peripheral neuropathy that presents to the clinic for a left met head wound. She has been following with [name], podiatry for over a year for this issue. She has tried collagen, mupirocin ointment and Medihoney. She is at high fall risk and cannot tolerate a total contact cast. She has orthotics with specialized inserts for her current issue. She currently denies signs of infection. 8/10; patient presents for follow-up. Unfortunately Regranex was unaffordable at $1200. She has been using Medihoney to the wound bed. She reports aggressively offloading the area. She has offloading inserts. She denies signs of infection. 8/22; patient presents for follow-up. She has been using Medihoney to the wound bed with improvement in wound healing. She has no issues or complaints today. She will be out of town starting next week for the next 2 weeks. Electronic Signature(s) Signed: 11/09/2021 3:51:23 PM By: Holly Shan DO Entered By: [name] on 11/09/2021 12:58:08 -------------------------------------------------------------------------------- Physical Exam Details Patient Name: Date of Service: [name], [name] 11/09/2021 12:30 PM Medical Record Number: [id number] Patient Account Number: [id number] Date of Birth/Sex: Treating RN: 1950/07/15 (71 y.o. [name] Primary Care Provider: Harlan [name] Other Clinician: Referring Provider: Treating Provider/Extender: [name] Weeks in Treatment: 2 Constitutional respirations regular, non-labored and within target range for patient.. Cardiovascular 2+  dorsalis pedis/posterior tibialis pulses. Psychiatric pleasant and cooperative. Notes Left foot: T the left first metatarsal there is an open wound with granulation tissue at the opening. No increased warmth, erythema or purulent  drainage. o Electronic Signature(s) Signed: 11/09/2021 3:51:23 PM By: Holly Shan DO Entered By: [name] on 11/09/2021 12:59:41 -------------------------------------------------------------------------------- Physician Orders Details Patient Name: Date of Service: [name], Holly RA H A. 11/09/2021 12:30 PM Medical Record Number: [id number] Patient Account Number: [id number] Date of Birth/Sex: Treating RN: 07/03/1950 (71 y.o. [name] Primary Care Provider: Harlan [name] Other Clinician: Referring Provider: Treating Provider/Extender: [name] in Treatment: 2 Verbal / Phone Orders: No Diagnosis Coding ICD-10 Coding Code Description [phone] Non-pressure chronic ulcer of other part of left foot with fat layer exposed E11.621 Type 2 diabetes mellitus with foot ulcer E11.42 Type 2 diabetes mellitus with diabetic polyneuropathy Follow-up Appointments ppointment in 2 weeks. - 11/29/21 @ 2:45pm with Dr. Heber [place] and Leveda Anna, RN (Room 7) Return A Other: - Prism=Supplies Anesthetic (In clinic) Topical Lidocaine 5% applied to wound bed (In clinic) Topical Lidocaine 4% applied to wound bed Bathing/ Shower/ Hygiene May shower and wash wound with soap and water. Edema Control - Lymphedema / SCD / Other Avoid standing for long periods of time. Off-Loading Other: - Diabetic Shoes Relieve pressure/time standing of feet Additional Orders / Instructions Follow Nutritious Diet - Monitor/Control Blood Sugars Non Wound Condition Other Non Wound Condition Orders/Instructions: - Apply corn pad or gauze between great toe and 2nd toe. Wound Treatment Wound #4 - Metatarsal head first Wound Laterality: Plantar, Left Cleanser: Soap and Water 1 x Per Day/30 Days Discharge Instructions: May shower and wash wound with dial antibacterial soap and water prior to dressing change. Peri-Wound Care: Skin Prep (Generic) 1 x Per Day/30 Days Discharge Instructions: Use  skin prep as directed Prim Dressing: MediHoney Gel, tube 1.5 (oz) 1 x Per Day/30 Days ary Discharge Instructions: Apply until Regranex obtained Prim Dressing: Optifoam Non-Adhesive Dressing, 4x4 in 1 x Per Day/30 Days ary Discharge Instructions: Apply as donut around wound Secondary Dressing: ALLEVYN Gentle Border, 3x3 (in/in) (Generic) 1 x Per Day/30 Days Discharge Instructions: Apply over primary dressing as directed. Electronic Signature(s) Signed: 11/09/2021 3:51:23 PM By: Holly Shan DO Entered By: [name] on 11/09/2021 12:59:49 -------------------------------------------------------------------------------- Problem List Details Patient Name: Date of Service: [name], Holly RA H A. 11/09/2021 12:30 PM Medical Record Number: [id number] Patient Account Number: [id number] Date of Birth/Sex: Treating RN: [birth year] (71 y.o. [name] Primary Care Provider: Harlan [name] Other Clinician: Referring Provider: Treating Provider/Extender: [name] Weeks in Treatment: 2 Active Problems ICD-10 Encounter Code Description Active Date MDM Diagnosis L97.522 Non-pressure chronic ulcer of other part of left foot with fat layer exposed 10/21/2021 No Yes E11.621 Type 2 diabetes mellitus with foot ulcer 10/21/2021 No Yes E11.42 Type 2 diabetes mellitus with diabetic polyneuropathy 10/21/2021 No Yes Inactive Problems Resolved Problems Electronic Signature(s) Signed: 11/09/2021 3:51:23 PM By: Holly Shan DO Entered By: [name] on 11/09/2021 12:57:22 -------------------------------------------------------------------------------- Progress Note Details Patient Name: Date of Service: [name], Holly RA H A. 11/09/2021 12:30 PM Medical Record Number: [id number] Patient Account Number: [id number] Date of Birth/Sex: Treating RN: 12-26-1950 (71 y.o. [name] Primary Care Provider: Harlan [name] Other Clinician: Referring Provider: Treating  Provider/Extender: [name] in Treatment: 2 Subjective Chief Complaint Information obtained from Patient Left plantar ulcer 11/12/2019; patient is here referred by podiatry for review of a wound on the  left first plantar metatarsal head 03/30/2021; right second toe wound s/p Trauma History of Present Illness (HPI) 05/10/17 patient presents today for initial evaluation and our clinic concerning issues that she has been having with an ulceration on the plantar aspect of the left first metatarsal head. She has been seen a podiatrist [name] for this since August 2018. Subsequently he has recommended bunion surgery as that seems to be the culprit for why there is pressure and friction occurring at the site causing the callous and subsequently the wound. 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 With that being said I do have notes from several of his visits one of which does include an x-ray documentation stating that she had a negative x-ray. There was obviously the bunion the notes I have extended from 11 deformity. With that being said she has not had an MRI fortunately this wound does not probe to bone. The notes I have extended from February 08, 2017 through April 14, 2017. During that course she was also placed on antibiotics to help with an infection two weeks ago and this was doxycycline. Fortunately that seems to have completely resolved any infection issues that she may have had. Unfortunately this is an ulcer that she has actually been dealing with since April 2018. She just initially was trying to treat it and care for it on her own. Her most recent hemoglobin A1c was 5.8, she is a non-smoker, she does have a right first toe amputation and a left third toe amputation. Fortunately she's not having any discomfort at the  site. 05/17/17 on evaluation today patient's ulcer on the plantar aspect of the first metatarsal region appears to be close at this point. There does not appear to be any evidence of ulcer opening. This is on initial inspection. She has not really had any discomfort but honestly she really was not experiencing a lot of pain even before. There is no evidence of infection or fluctuation under the callous which is present at this point. ADMISSION 11/12/2019 This is a 71 year old woman that we previously had in this clinic in 2017 for two visits with a wound I think roughly in the same area. At that point she had a bunion in the left. She has apparently had surgery on this since then. Looking through care everywhere we can see that she has had follow-up with podiatry from 06/12/2019 through 10/28/2019 for a wound on the plantar aspect of her left first MTP. She states when this started she was having severe knee pain on the right which forced her to alter her gait. She has recently received doxycycline which she is finished and Bactroban. She is back to using Medihoney on the wound. She is offloading with the Pegasys shoe. Past medical history includes type 2 diabetes with a recent hemoglobin A1c of 6.8, severe type II diabetic neuropathy, history of a CVA, stage III chronic renal failure hypertension, depression, of bilateral previous bunion surgery, right first toe amputation and a partial left third toe amputation for osteomyelitis. The patient states that she recently had work to do in her mother's house in [place] and so she was on her feet quite a lot which delayed the healing. ABI in our clinic was 1.1 on the left 8/31; plantar aspect of her left first metatarsal head. Using silver collagen. This is probably a  surgical wound 9/14; this is a patient who had a wound on the plantar aspect of her left first metatarsal head in the setting of a significant bunion deformity. We use cervical collagen and a  offloading shoe. She has significant diabetic neuropathy probably some degree of a Charcot deformity in her feet. This is closed over today. Readmission 03/30/2021 [name] is a 71 year old female with a past medical history of insulin-dependent controlled type 2 diabetes s/p right great toe amputation, right knee osteoarthritis and hypertension that presents to the clinic for a second right toe wound. She states she was getting out of the bathtub when her foot got caught the tub and the skin to her second toe was split open. She noticed this after the bath when she saw blood on the floor. She has neuropathy in her feet. She visited the ED following the event on 03/26/2021. They obtained x-rays that showed a fracture to the fifth toe but no fracture to the second toe. She was started on doxycycline. She currently denies signs of infection. She has been keeping the area covered with a Band-Aid. Readmission 10/21/2021 [name] is a 71 year old female with a past medical history of insulin dependent controlled type 2 diabetes complicated by peripheral neuropathy that presents to the clinic for a left met head wound. She has been following with [name], podiatry for over a year for this issue. She has tried collagen, mupirocin ointment and Medihoney. She is at high fall risk and cannot tolerate a total contact cast. She has orthotics with specialized inserts for her current issue. She currently denies signs of infection. 8/10; patient presents for follow-up. Unfortunately Regranex was unaffordable at $1200. She has been using Medihoney to the wound bed. She reports aggressively offloading the area. She has offloading inserts. She denies signs of infection. 8/22; patient presents for follow-up. She has been using Medihoney to the wound bed with improvement in wound healing. She has no issues or complaints today. She will be out of town starting next week for the next 2 weeks. Patient  History Information obtained from Patient. Family History Cancer - Father, Hypertension - Mother,Father,Siblings, Stroke - Mother,Father, No family history of Diabetes, Heart Disease, Hereditary Spherocytosis, Kidney Disease, Lung Disease, Seizures, Thyroid Problems, Tuberculosis. Social History Never smoker, Marital Status - Single, Alcohol Use - Never, Drug Use - No History, Caffeine Use - Rarely. Medical History Eyes Patient has history of Cataracts Denies history of Glaucoma, Optic Neuritis Ear/Nose/Mouth/Throat Denies history of Chronic sinus problems/congestion, Middle ear problems Hematologic/Lymphatic Denies history of Anemia, Hemophilia, Human Immunodeficiency Virus, Lymphedema, Sickle Cell Disease Respiratory Patient has history of Sleep Apnea Denies history of Aspiration, Asthma, Chronic Obstructive Pulmonary Disease (COPD), Pneumothorax, Tuberculosis Cardiovascular Patient has history of Hypertension Denies history of Angina, Arrhythmia, Congestive Heart Failure, Coronary Artery Disease, Deep Vein Thrombosis, Hypotension, Myocardial Infarction, Peripheral Arterial Disease, Peripheral Venous Disease, Phlebitis, Vasculitis Gastrointestinal Denies history of Cirrhosis , Colitis, Crohnoos, Hepatitis A, Hepatitis B, Hepatitis C Endocrine Patient has history of Type II Diabetes Denies history of Type I Diabetes Genitourinary Denies history of End Stage Renal Disease Immunological Denies history of Lupus Erythematosus, Raynaudoos, Scleroderma Integumentary (Skin) Denies history of History of Burn Musculoskeletal Patient has history of Osteoarthritis, Osteomyelitis - Hx in 2007 (left 3rd toe) Denies history of Gout, Rheumatoid Arthritis Neurologic Patient has history of Neuropathy Denies history of Dementia, Quadriplegia, Paraplegia, Seizure Disorder Oncologic Patient has history of Received Radiation - 03/2020 Denies history of Received  Chemotherapy Psychiatric  Patient has history of Confinement Anxiety Denies history of Anorexia/bulimia Hospitalization/Surgery History - Amputation of right great toe. - Tendon repair. - Hysterectomy. - Osteomyelitis left 3rd toe. - cervical fusion with cage. - left breast lumpectomy. Medical A Surgical History Notes nd Constitutional Symptoms (General Health) morbid obesity Cardiovascular Takotsubo cardiomyopathy 2012, hyperlipidemia, Hx CVA, Tachycardia Genitourinary CKD stage 3 Musculoskeletal left leg weakness Neurologic CVA Oncologic hx endometrial cancer, left breast CA Psychiatric depression Objective Constitutional respirations regular, non-labored and within target range for patient.. Vitals Time Taken: 12:35 PM, Temperature: 97.9 F, Pulse: 73 bpm, Respiratory Rate: 18 breaths/min, Blood Pressure: 124/73 mmHg, Capillary Blood Glucose: 144 mg/dl. Cardiovascular 2+ dorsalis pedis/posterior tibialis pulses. Psychiatric pleasant and cooperative. General Notes: Left foot: T the left first metatarsal there is an open wound with granulation tissue at the opening. No increased warmth, erythema or purulent o drainage. Integumentary (Hair, Skin) Wound #4 status is Open. Original cause of wound was Gradually Appeared. The date acquired was: 10/02/2020. The wound has been in treatment 2 weeks. The wound is located on the Left,Plantar Metatarsal head first. The wound measures 0.5cm length x 0.3cm width x 0.2cm depth; 0.118cm^2 area and 0.024cm^3 volume. There is Fat Layer (Subcutaneous Tissue) exposed. There is no tunneling or undermining noted. There is a medium amount of serosanguineous drainage noted. The wound margin is distinct with the outline attached to the wound base. There is large (67-100%) red granulation within the wound bed. There is a small (1-33%) amount of necrotic tissue within the wound bed including Adherent Slough. General Notes: Calloused  Periwound Assessment Active Problems ICD-10 Non-pressure chronic ulcer of other part of left foot with fat layer exposed Type 2 diabetes mellitus with foot ulcer Type 2 diabetes mellitus with diabetic polyneuropathy Patient's wound has shown improvement in size and appearance since last clinic visit. No need for debridement today. I recommended continuing Medihoney and aggressive offloading. Patient will be going out of town and can follow-up in 3 weeks. Plan Follow-up Appointments: Return Appointment in 2 weeks. - 11/29/21 @ 2:45pm with Dr. Heber Meagher and Leveda Anna, RN (Room 7) Other: - Prism=Supplies Anesthetic: (In clinic) Topical Lidocaine 5% applied to wound bed (In clinic) Topical Lidocaine 4% applied to wound bed Bathing/ Shower/ Hygiene: May shower and wash wound with soap and water. Edema Control - Lymphedema / SCD / Other: Avoid standing for long periods of time. Off-Loading: Other: - Diabetic Shoes Relieve pressure/time standing of feet Additional Orders / Instructions: Follow Nutritious Diet - Monitor/Control Blood Sugars Non Wound Condition: Other Non Wound Condition Orders/Instructions: - Apply corn pad or gauze between great toe and 2nd toe. WOUND #4: - Metatarsal head first Wound Laterality: Plantar, Left Cleanser: Soap and Water 1 x Per Day/30 Days Discharge Instructions: May shower and wash wound with dial antibacterial soap and water prior to dressing change. Peri-Wound Care: Skin Prep (Generic) 1 x Per Day/30 Days Discharge Instructions: Use skin prep as directed Prim Dressing: MediHoney Gel, tube 1.5 (oz) 1 x Per Day/30 Days ary Discharge Instructions: Apply until Regranex obtained Prim Dressing: Optifoam Non-Adhesive Dressing, 4x4 in 1 x Per Day/30 Days ary Discharge Instructions: Apply as donut around wound Secondary Dressing: ALLEVYN Gentle Border, 3x3 (in/in) (Generic) 1 x Per Day/30 Days Discharge Instructions: Apply over primary dressing as directed. 1.  Medihoney 2. Follow-up in 3 weeks 3. Aggressive offloadingoooffloading foam donut Electronic Signature(s) Signed: 11/09/2021 3:51:23 PM By: Holly Shan DO Entered By: [name] on 11/09/2021 13:00:58 -------------------------------------------------------------------------------- HxROS Details Patient Name: Date of Service:  [name], Holly RA H A. 11/09/2021 12:30 PM Medical Record Number: [id number] Patient Account Number: [id number] Date of Birth/Sex: Treating RN: [birth year] (71 y.o. [name] Primary Care Provider: Harlan [name] Other Clinician: Referring Provider: Treating Provider/Extender: [name] in Treatment: 2 Information Obtained From Patient Constitutional Symptoms (General Health) Medical History: Past Medical History Notes: morbid obesity Eyes Medical History: Positive for: Cataracts Negative for: Glaucoma; Optic Neuritis Ear/Nose/Mouth/Throat Medical History: Negative for: Chronic sinus problems/congestion; Middle ear problems Hematologic/Lymphatic Medical History: Negative for: Anemia; Hemophilia; Human Immunodeficiency Virus; Lymphedema; Sickle Cell Disease Respiratory Medical History: Positive for: Sleep Apnea Negative for: Aspiration; Asthma; Chronic Obstructive Pulmonary Disease (COPD); Pneumothorax; Tuberculosis Cardiovascular Medical History: Positive for: Hypertension Negative for: Angina; Arrhythmia; Congestive Heart Failure; Coronary Artery Disease; Deep Vein Thrombosis; Hypotension; Myocardial Infarction; Peripheral Arterial Disease; Peripheral Venous Disease; Phlebitis; Vasculitis Past Medical History Notes: Takotsubo cardiomyopathy 2012, hyperlipidemia, Hx CVA, Tachycardia Gastrointestinal Medical History: Negative for: Cirrhosis ; Colitis; Crohns; Hepatitis A; Hepatitis B; Hepatitis C Endocrine Medical History: Positive for: Type II Diabetes Negative for: Type I Diabetes Time with diabetes:  over 20 years Treated with: Insulin, Oral agents Blood sugar tested every day: Yes Tested : daily Genitourinary Medical History: Negative for: End Stage Renal Disease Past Medical History Notes: CKD stage 3 Immunological Medical History: Negative for: Lupus Erythematosus; Raynauds; Scleroderma Integumentary (Skin) Medical History: Negative for: History of Burn Musculoskeletal Medical History: Positive for: Osteoarthritis; Osteomyelitis - Hx in 2007 (left 3rd toe) Negative for: Gout; Rheumatoid Arthritis Past Medical History Notes: left leg weakness Neurologic Medical History: Positive for: Neuropathy Negative for: Dementia; Quadriplegia; Paraplegia; Seizure Disorder Past Medical History Notes: CVA Oncologic Medical History: Positive for: Received Radiation - 03/2020 Negative for: Received Chemotherapy Past Medical History Notes: hx endometrial cancer, left breast CA Psychiatric Medical History: Positive for: Confinement Anxiety Negative for: Anorexia/bulimia Past Medical History Notes: depression HBO Extended History Items Eyes: Cataracts Immunizations Pneumococcal Vaccine: Received Pneumococcal Vaccination: Yes Received Pneumococcal Vaccination On or After 60th Birthday: Yes Implantable Devices None Hospitalization / Surgery History Type of Hospitalization/Surgery Amputation of right great toe Tendon repair Hysterectomy Osteomyelitis left 3rd toe cervical fusion with cage left breast lumpectomy Family and Social History Cancer: Yes - Father; Diabetes: No; Heart Disease: No; Hereditary Spherocytosis: No; Hypertension: Yes - Mother,Father,Siblings; Kidney Disease: No; Lung Disease: No; Seizures: No; Stroke: Yes - Mother,Father; Thyroid Problems: No; Tuberculosis: No; Never smoker; Marital Status - Single; Alcohol Use: Never; Drug Use: No History; Caffeine Use: Rarely; Financial Concerns: No; Food, Clothing or Shelter Needs: No; Support System Lacking:  No; Transportation Concerns: No Electronic Signature(s) Signed: 11/09/2021 3:51:23 PM By: Holly Shan DO Signed: 11/09/2021 3:57:26 PM By: Lorrin Jackson Entered By: [name] on 11/09/2021 12:58:16 -------------------------------------------------------------------------------- SuperBill Details Patient Name: Date of Service: Holly Conroy RA H A. 11/09/2021 Medical Record Number: [id number] Patient Account Number: [id number] Date of Birth/Sex: Treating RN: 06/05/1950 (71 y.o. [name] Primary Care Provider: Harlan [name] Other Clinician: Referring Provider: Treating Provider/Extender: [name] Weeks in Treatment: 2 Diagnosis Coding ICD-10 Codes Code Description [phone] Non-pressure chronic ulcer of other part of left foot with fat layer exposed E11.621 Type 2 diabetes mellitus with foot ulcer E11.42 Type 2 diabetes mellitus with diabetic polyneuropathy Facility Procedures CPT4 Code: [id number] Description: 99213 - WOUND CARE VISIT-LEV 3 EST PT Modifier: Quantity: 1 Physician Procedures : CPT4 Code Description Modifier [id number] - WC PHYS LEVEL 3 - EST PT ICD-10 Diagnosis Description L97.522 Non-pressure chronic ulcer of other  part of left foot with fat layer exposed E11.621 Type 2 diabetes mellitus with foot ulcer E11.42 Type 2  diabetes mellitus with diabetic polyneuropathy Quantity: 1 Electronic Signature(s) Signed: 11/09/2021 3:51:23 PM By: Holly Shan DO Entered By: [name] on 11/09/2021 13:01:13

## 2021-11-09 NOTE — Progress Notes (Signed)
KERINA, SIMONEAU ([id number]) Visit Report for 11/09/2021 Arrival Information Details Patient Name: Date of Service: [name] 11/09/2021 12:30 PM Medical Record Number: [id number] Patient Account Number: [id number] Date of Birth/Sex: Treating RN: January 19, 1951 (71 y.o. [name] Primary Care Tatum Massman: [name] Other Clinician: Referring Tino Ronan: Treating Reisa Coppola/Extender: [name] in Treatment: 2 Visit Information History Since Last Visit Added or deleted any medications: No Patient Arrived: [name] Any new allergies or adverse reactions: No Arrival Time: 12:31 Had a fall or experienced change in No Transfer Assistance: None activities of daily living that may affect Patient Identification Verified: Yes risk of falls: Secondary Verification Process Completed: Yes Signs or symptoms of abuse/neglect since last visito No Patient Requires Transmission-Based Precautions: No Hospitalized since last visit: No Patient Has Alerts: Yes Implantable device outside of the clinic excluding No Patient Alerts: ABI's R=1.07 L=1.02 cellular tissue based products placed in the center since last visit: Has Dressing in Place as Prescribed: Yes Pain Present Now: No Electronic Signature(s) Signed: 11/09/2021 3:57:26 PM By: Lorrin Jackson Entered By: Lorrin Jackson on 11/09/2021 12:35:22 -------------------------------------------------------------------------------- Clinic Level of Care Assessment Details Patient Name: Date of Service: Holly Alar A. 11/09/2021 12:30 PM Medical Record Number: [id number] Patient Account Number: [id number] Date of Birth/Sex: Treating RN: 1950/10/20 (71 y.o. [name] Primary Care Kailena Lubas: [name] Other Clinician: Referring Hatice Bubel: Treating Jakevion Arney/Extender: [name] in Treatment: 2 Clinic Level of Care Assessment Items TOOL 4 Quantity Score X- 1 0 Use when only  an EandM is performed on FOLLOW-UP visit ASSESSMENTS - Nursing Assessment / Reassessment X- 1 10 Reassessment of Co-morbidities (includes updates in patient status) X- 1 5 Reassessment of Adherence to Treatment Plan ASSESSMENTS - Wound and Skin A ssessment / Reassessment X - Simple Wound Assessment / Reassessment - one wound 1 5 '[]'$  - 0 Complex Wound Assessment / Reassessment - multiple wounds '[]'$  - 0 Dermatologic / Skin Assessment (not related to wound area) ASSESSMENTS - Focused Assessment '[]'$  - 0 Circumferential Edema Measurements - multi extremities '[]'$  - 0 Nutritional Assessment / Counseling / Intervention '[]'$  - 0 Lower Extremity Assessment (monofilament, tuning fork, pulses) '[]'$  - 0 Peripheral Arterial Disease Assessment (using hand held doppler) ASSESSMENTS - Ostomy and/or Continence Assessment and Care '[]'$  - 0 Incontinence Assessment and Management '[]'$  - 0 Ostomy Care Assessment and Management (repouching, etc.) PROCESS - Coordination of Care '[]'$  - 0 Simple Patient / Family Education for ongoing care X- 1 20 Complex (extensive) Patient / Family Education for ongoing care X- 1 10 Staff obtains Programmer, systems, Records, T Results / Process Orders est '[]'$  - 0 Staff telephones HHA, Nursing Homes / Clarify orders / etc '[]'$  - 0 Routine Transfer to another Facility (non-emergent condition) '[]'$  - 0 Routine Hospital Admission (non-emergent condition) '[]'$  - 0 New Admissions / Biomedical engineer / Ordering NPWT Apligraf, etc. , '[]'$  - 0 Emergency Hospital Admission (emergent condition) '[]'$  - 0 Simple Discharge Coordination '[]'$  - 0 Complex (extensive) Discharge Coordination PROCESS - Special Needs '[]'$  - 0 Pediatric / Minor Patient Management '[]'$  - 0 Isolation Patient Management '[]'$  - 0 Hearing / Language / Visual special needs '[]'$  - 0 Assessment of Community assistance (transportation, D/C planning, etc.) '[]'$  - 0 Additional assistance / Altered mentation '[]'$  - 0 Support Surface(s)  Assessment (bed, cushion, seat, etc.) INTERVENTIONS - Wound Cleansing / Measurement X - Simple Wound Cleansing - one wound 1 5 '[]'$  - 0 Complex Wound Cleansing -  multiple wounds X- 1 5 Wound Imaging (photographs - any number of wounds) '[]'$  - 0 Wound Tracing (instead of photographs) X- 1 5 Simple Wound Measurement - one wound '[]'$  - 0 Complex Wound Measurement - multiple wounds INTERVENTIONS - Wound Dressings X - Small Wound Dressing one or multiple wounds 1 10 '[]'$  - 0 Medium Wound Dressing one or multiple wounds '[]'$  - 0 Large Wound Dressing one or multiple wounds '[]'$  - 0 Application of Medications - topical '[]'$  - 0 Application of Medications - injection INTERVENTIONS - Miscellaneous '[]'$  - 0 External ear exam '[]'$  - 0 Specimen Collection (cultures, biopsies, blood, body fluids, etc.) '[]'$  - 0 Specimen(s) / Culture(s) sent or taken to Lab for analysis '[]'$  - 0 Patient Transfer (multiple staff / Civil Service fast streamer / Similar devices) '[]'$  - 0 Simple Staple / Suture removal (25 or less) '[]'$  - 0 Complex Staple / Suture removal (26 or more) '[]'$  - 0 Hypo / Hyperglycemic Management (close monitor of Blood Glucose) '[]'$  - 0 Ankle / Brachial Index (ABI) - do not check if billed separately X- 1 5 Vital Signs Has the patient been seen at the hospital within the last three years: Yes Total Score: 80 Level Of Care: New/Established - Level 3 Electronic Signature(s) Signed: 11/09/2021 3:57:26 PM By: Lorrin Jackson Entered By: Lorrin Jackson on 11/09/2021 12:55:53 -------------------------------------------------------------------------------- Encounter Discharge Information Details Patient Name: Date of Service: [name], Holly RA H A. 11/09/2021 12:30 PM Medical Record Number: [id number] Patient Account Number: [id number] Date of Birth/Sex: Treating RN: 1951-02-27 (71 y.o. [name] Primary Care Rayetta Veith: [name] Other Clinician: Referring Sorren Vallier: Treating Quentavious Rittenhouse/Extender: [name] in Treatment: 2 Encounter Discharge Information Items Discharge Condition: Stable Ambulatory Status: [place] Discharge Destination: Home Transportation: Private Auto Schedule Follow-up Appointment: Yes Clinical Summary of Care: Provided on 11/09/2021 Form Type Recipient Paper Patient Patient Electronic Signature(s) Signed: 11/09/2021 1:11:09 PM By: Lorrin Jackson Entered By: Lorrin Jackson on 11/09/2021 13:11:09 -------------------------------------------------------------------------------- Lower Extremity Assessment Details Patient Name: Date of Service: Holly Conroy RA H A. 11/09/2021 12:30 PM Medical Record Number: [id number] Patient Account Number: [id number] Date of Birth/Sex: Treating RN: Oct 29, 1950 (71 y.o. [name] Primary Care Stony Stegmann: [name] Other Clinician: Referring Natanel Snavely: Treating Huxley Vanwagoner/Extender: [name] Weeks in Treatment: 2 Edema Assessment Assessed: [Left: Yes] [Right: No] Edema: [Left: N] [Right: o] Calf Left: Right: Point of Measurement: 34 cm From Medial Instep 48 cm Ankle Left: Right: Point of Measurement: 9 cm From Medial Instep 24 cm Vascular Assessment Pulses: Dorsalis Pedis Palpable: [Left:Yes] Electronic Signature(s) Signed: 11/09/2021 3:57:26 PM By: Lorrin Jackson Entered By: Lorrin Jackson on 11/09/2021 12:36:32 -------------------------------------------------------------------------------- Multi Wound Chart Details Patient Name: Date of Service: [name], Holly RA H A. 11/09/2021 12:30 PM Medical Record Number: [id number] Patient Account Number: [id number] Date of Birth/Sex: Treating RN: 1950/05/19 (71 y.o. [name] Primary Care Ellsworth Waldschmidt: [name] Other Clinician: Referring Jakaree Pickard: Treating Davyd Podgorski/Extender: [name] Weeks in Treatment: 2 Vital Signs Height(in): Capillary Blood Glucose(mg/dl): 144 Weight(lbs): Pulse(bpm):  74 Body Mass Index(BMI): Blood Pressure(mmHg): 124/73 Temperature(F): 97.9 Respiratory Rate(breaths/min): 18 Photos: [N/A:N/A] Left, Plantar Metatarsal head first N/A N/A Wound Location: Gradually Appeared N/A N/A Wounding Event: Diabetic Wound/Ulcer of the Lower N/A N/A Primary Etiology: Extremity Cataracts, Sleep Apnea, Hypertension, N/A N/A Comorbid History: Type II Diabetes, Osteoarthritis, Osteomyelitis, Neuropathy, Received Radiation, Confinement Anxiety 10/02/2020 N/A N/A Date Acquired: 2 N/A N/A Weeks of Treatment: Open N/A N/A Wound Status: No N/A N/A Wound Recurrence:  0.5x0.3x0.2 N/A N/A Measurements L x W x D (cm) 0.118 N/A N/A A (cm) : rea 0.024 N/A N/A Volume (cm) : 71.10% N/A N/A % Reduction in A rea: 80.50% N/A N/A % Reduction in Volume: Grade 1 N/A N/A Classification: Medium N/A N/A Exudate A mount: Serosanguineous N/A N/A Exudate Type: red, brown N/A N/A Exudate Color: Distinct, outline attached N/A N/A Wound Margin: Large (67-100%) N/A N/A Granulation A mount: Red N/A N/A Granulation Quality: Small (1-33%) N/A N/A Necrotic A mount: Fat Layer (Subcutaneous Tissue): Yes N/A N/A Exposed Structures: Fascia: No Tendon: No Muscle: No Joint: No Bone: No Medium (34-66%) N/A N/A Epithelialization: Calloused Periwound N/A N/A Assessment Notes: Treatment Notes Electronic Signature(s) Signed: 11/09/2021 3:51:23 PM By: [name] DO Signed: 11/09/2021 3:57:26 PM By: Lorrin Jackson Entered By: [name] on 11/09/2021 12:57:30 -------------------------------------------------------------------------------- Multi-Disciplinary Care Plan Details Patient Name: Date of Service: [name], Holly Dess RA H A. 11/09/2021 12:30 PM Medical Record Number: [id number] Patient Account Number: [id number] Date of Birth/Sex: Treating RN: 05/18/1950 (71 y.o. [name] Primary Care Hayde Kilgour: [name] Other Clinician: Referring  Sekou Zuckerman: Treating Wilena Tyndall/Extender: [name] in Treatment: 2 Active Inactive Nutrition Nursing Diagnoses: Impaired glucose control: actual or potential Goals: Patient/caregiver verbalizes understanding of need to maintain therapeutic glucose control per primary care physician Date Initiated: 10/21/2021 Target Resolution Date: 11/18/2021 Goal Status: Active Interventions: Assess HgA1c results as ordered upon admission and as needed Provide education on elevated blood sugars and impact on wound healing Notes: Wound/Skin Impairment Nursing Diagnoses: Impaired tissue integrity Goals: Patient/caregiver will verbalize understanding of skin care regimen Date Initiated: 10/21/2021 Target Resolution Date: 11/18/2021 Goal Status: Active Ulcer/skin breakdown will have a volume reduction of 30% by week 4 Date Initiated: 10/21/2021 Target Resolution Date: 11/18/2021 Goal Status: Active Interventions: Assess patient/caregiver ability to obtain necessary supplies Assess patient/caregiver ability to perform ulcer/skin care regimen upon admission and as needed Assess ulceration(s) every visit Provide education on ulcer and skin care Treatment Activities: Topical wound management initiated : 10/21/2021 Notes: Electronic Signature(s) Signed: 11/09/2021 3:57:26 PM By: Lorrin Jackson Entered By: Lorrin Jackson on 11/09/2021 12:30:36 -------------------------------------------------------------------------------- Pain Assessment Details Patient Name: Date of Service: [name], Holly RA H A. 11/09/2021 12:30 PM Medical Record Number: [id number] Patient Account Number: [id number] Date of Birth/Sex: Treating RN: 1950-08-28 (71 y.o. [name] Primary Care Cornie Mccomber: [name] Other Clinician: Referring Chidinma Clites: Treating Leeum Sankey/Extender: [name] Weeks in Treatment: 2 Active Problems Location of Pain Severity and Description of  Pain Patient Has Paino No Site Locations Pain Management and Medication Current Pain Management: Electronic Signature(s) Signed: 11/09/2021 3:57:26 PM By: Lorrin Jackson Entered By: Lorrin Jackson on 11/09/2021 12:36:11 -------------------------------------------------------------------------------- Patient/Caregiver Education Details Patient Name: Date of Service: Holly Alar A. 8/22/2023andnbsp12:30 PM Medical Record Number: [id number] Patient Account Number: [id number] Date of Birth/Gender: Treating RN: 1950/07/16 (71 y.o. [name] Primary Care Physician: [name] Other Clinician: Referring Physician: Treating Physician/Extender: [name] in Treatment: 2 Education Assessment Education Provided To: Patient Education Topics Provided Elevated Blood Sugar/ Impact on Healing: Methods: Explain/Verbal, Printed Responses: State content correctly Offloading: Methods: Explain/Verbal, Printed Responses: State content correctly Wound/Skin Impairment: Methods: Demonstration, Explain/Verbal, Printed Responses: State content correctly Electronic Signature(s) Signed: 11/09/2021 3:57:26 PM By: Lorrin Jackson Entered By: Lorrin Jackson on 11/09/2021 12:31:03 -------------------------------------------------------------------------------- Wound Assessment Details Patient Name: Date of Service: [name], Holly RA H A. 11/09/2021 12:30 PM Medical Record Number: [id number] Patient Account Number: [id number] Date of Birth/Sex:  Treating RN: December 16, 1950 (71 y.o. [name] Primary Care Shantel Wesely: [name] Other Clinician: Referring Ayeza Therriault: Treating Riyah Bardon/Extender: [name] Weeks in Treatment: 2 Wound Status Wound Number: 4 Primary Diabetic Wound/Ulcer of the Lower Extremity Etiology: Wound Location: Left, Plantar Metatarsal head first Wound Open Wounding Event: Gradually Appeared Status: Date  Acquired: 10/02/2020 Comorbid Cataracts, Sleep Apnea, Hypertension, Type II Diabetes, Weeks Of Treatment: 2 History: Osteoarthritis, Osteomyelitis, Neuropathy, Received Radiation, Clustered Wound: No Confinement Anxiety Photos Wound Measurements Length: (cm) 0.5 Width: (cm) 0.3 Depth: (cm) 0.2 Area: (cm) 0.118 Volume: (cm) 0.024 % Reduction in Area: 71.1% % Reduction in Volume: 80.5% Epithelialization: Medium (34-66%) Tunneling: No Undermining: No Wound Description Classification: Grade 1 Wound Margin: Distinct, outline attached Exudate Amount: Medium Exudate Type: Serosanguineous Exudate Color: red, brown Foul Odor After Cleansing: No Slough/Fibrino Yes Wound Bed Granulation Amount: Large (67-100%) Exposed Structure Granulation Quality: Red Fascia Exposed: No Necrotic Amount: Small (1-33%) Fat Layer (Subcutaneous Tissue) Exposed: Yes Necrotic Quality: Adherent Slough Tendon Exposed: No Muscle Exposed: No Joint Exposed: No Bone Exposed: No Assessment Notes Calloused Periwound Treatment Notes Wound #4 (Metatarsal head first) Wound Laterality: Plantar, Left Cleanser Soap and Water Discharge Instruction: May shower and wash wound with dial antibacterial soap and water prior to dressing change. Peri-Wound Care Skin Prep Discharge Instruction: Use skin prep as directed Topical Primary Dressing MediHoney Gel, tube 1.5 (oz) Discharge Instruction: Apply until Regranex obtained Optifoam Non-Adhesive Dressing, 4x4 in Discharge Instruction: Apply as donut around wound Secondary Dressing ALLEVYN Gentle Border, 3x3 (in/in) Discharge Instruction: Apply over primary dressing as directed. Secured With Compression Wrap Compression Stockings Environmental education officer) Signed: 11/09/2021 3:57:26 PM By: Lorrin Jackson Entered By: Lorrin Jackson on 11/09/2021 12:41:16 -------------------------------------------------------------------------------- Vitals  Details Patient Name: Date of Service: [name], Holly RA H A. 11/09/2021 12:30 PM Medical Record Number: [id number] Patient Account Number: [id number] Date of Birth/Sex: Treating RN: 09/02/1950 (71 y.o. [name] Primary Care Ronita Hargreaves: [name] Other Clinician: Referring Oaklan Persons: Treating Adyn Serna/Extender: [name] Weeks in Treatment: 2 Vital Signs Time Taken: 12:35 Temperature (F): 97.9 Pulse (bpm): 73 Respiratory Rate (breaths/min): 18 Blood Pressure (mmHg): 124/73 Capillary Blood Glucose (mg/dl): 144 Reference Range: 80 - 120 mg / dl Electronic Signature(s) Signed: 11/09/2021 3:57:26 PM By: Lorrin Jackson Entered By: Lorrin Jackson on 11/09/2021 12:35:51

## 2021-11-16 ENCOUNTER — Other Ambulatory Visit: Payer: Self-pay | Admitting: Family Medicine

## 2021-11-16 DIAGNOSIS — R0989 Other specified symptoms and signs involving the circulatory and respiratory systems: Secondary | ICD-10-CM

## 2021-11-16 DIAGNOSIS — N183 Chronic kidney disease, stage 3 unspecified: Secondary | ICD-10-CM | POA: Diagnosis not present

## 2021-11-16 DIAGNOSIS — F3341 Major depressive disorder, recurrent, in partial remission: Secondary | ICD-10-CM | POA: Diagnosis not present

## 2021-11-16 DIAGNOSIS — Z89411 Acquired absence of right great toe: Secondary | ICD-10-CM | POA: Diagnosis not present

## 2021-11-16 DIAGNOSIS — I129 Hypertensive chronic kidney disease with stage 1 through stage 4 chronic kidney disease, or unspecified chronic kidney disease: Secondary | ICD-10-CM | POA: Diagnosis not present

## 2021-11-16 DIAGNOSIS — E785 Hyperlipidemia, unspecified: Secondary | ICD-10-CM | POA: Diagnosis not present

## 2021-11-16 DIAGNOSIS — E1142 Type 2 diabetes mellitus with diabetic polyneuropathy: Secondary | ICD-10-CM | POA: Diagnosis not present

## 2021-11-16 DIAGNOSIS — Z89421 Acquired absence of other right toe(s): Secondary | ICD-10-CM | POA: Diagnosis not present

## 2021-11-16 DIAGNOSIS — I700 Atherosclerosis of aorta: Secondary | ICD-10-CM | POA: Diagnosis not present

## 2021-11-16 DIAGNOSIS — C50912 Malignant neoplasm of unspecified site of left female breast: Secondary | ICD-10-CM | POA: Diagnosis not present

## 2021-11-29 ENCOUNTER — Encounter (HOSPITAL_BASED_OUTPATIENT_CLINIC_OR_DEPARTMENT_OTHER): Payer: PPO | Attending: Internal Medicine | Admitting: Internal Medicine

## 2021-11-29 DIAGNOSIS — N183 Chronic kidney disease, stage 3 unspecified: Secondary | ICD-10-CM | POA: Diagnosis not present

## 2021-11-29 DIAGNOSIS — E1151 Type 2 diabetes mellitus with diabetic peripheral angiopathy without gangrene: Secondary | ICD-10-CM | POA: Diagnosis not present

## 2021-11-29 DIAGNOSIS — I129 Hypertensive chronic kidney disease with stage 1 through stage 4 chronic kidney disease, or unspecified chronic kidney disease: Secondary | ICD-10-CM | POA: Diagnosis not present

## 2021-11-29 DIAGNOSIS — Z794 Long term (current) use of insulin: Secondary | ICD-10-CM | POA: Diagnosis not present

## 2021-11-29 DIAGNOSIS — L97522 Non-pressure chronic ulcer of other part of left foot with fat layer exposed: Secondary | ICD-10-CM | POA: Insufficient documentation

## 2021-11-29 DIAGNOSIS — E11621 Type 2 diabetes mellitus with foot ulcer: Secondary | ICD-10-CM | POA: Diagnosis not present

## 2021-11-29 DIAGNOSIS — Z89411 Acquired absence of right great toe: Secondary | ICD-10-CM | POA: Diagnosis not present

## 2021-11-29 DIAGNOSIS — M1990 Unspecified osteoarthritis, unspecified site: Secondary | ICD-10-CM | POA: Insufficient documentation

## 2021-11-29 DIAGNOSIS — E1142 Type 2 diabetes mellitus with diabetic polyneuropathy: Secondary | ICD-10-CM | POA: Diagnosis not present

## 2021-11-29 DIAGNOSIS — E1122 Type 2 diabetes mellitus with diabetic chronic kidney disease: Secondary | ICD-10-CM | POA: Diagnosis not present

## 2021-11-29 NOTE — Progress Notes (Signed)
TRILBY, WAY ([id number]) Visit Report for 11/29/2021 Arrival Information Details Patient Name: Date of Service: Holly Alar A. 11/29/2021 2:45 PM Medical Record Number: [id number] Patient Account Number: [id number] Date of Birth/Sex: Treating RN: 23-Jul-1950 (71 y.o. [name] Primary Care Zander Ingham: [name] Other Clinician: Referring Savon Bordonaro: Treating Nicholus Chandran/Extender: [name] in Treatment: 5 Visit Information History Since Last Visit Added or deleted any medications: No Patient Arrived: [name] Any new allergies or adverse reactions: No Arrival Time: 15:02 Had a fall or experienced change in No Transfer Assistance: None activities of daily living that may affect Patient Identification Verified: Yes risk of falls: Secondary Verification Process Completed: Yes Signs or symptoms of abuse/neglect since last visito No Patient Requires Transmission-Based Precautions: No Hospitalized since last visit: No Patient Has Alerts: Yes Implantable device outside of the clinic excluding No Patient Alerts: ABI's R=1.07 L=1.02 cellular tissue based products placed in the center since last visit: Has Dressing in Place as Prescribed: Yes Pain Present Now: No Electronic Signature(s) Signed: 11/29/2021 5:02:56 PM By: Lorrin Jackson Entered By: Lorrin Jackson on 11/29/2021 15:02:44 -------------------------------------------------------------------------------- Encounter Discharge Information Details Patient Name: Date of Service: [name], Holly RA H A. 11/29/2021 2:45 PM Medical Record Number: [id number] Patient Account Number: [id number] Date of Birth/Sex: Treating RN: [birth year] (71 y.o. [name] Primary Care Ronny Korff: [name] Other Clinician: Referring Purvi Ruehl: Treating Lilyona Richner/Extender: [name] in Treatment: 5 Encounter Discharge Information Items Post Procedure Vitals Discharge Condition:  Stable Temperature (F): 98.3 Ambulatory Status: Cane Pulse (bpm): 74 Discharge Destination: Home Respiratory Rate (breaths/min): 18 Transportation: Private Auto Blood Pressure (mmHg): 136/77 Schedule Follow-up Appointment: Yes Clinical Summary of Care: Provided on 11/29/2021 Form Type Recipient Paper Patient Patient Electronic Signature(s) Signed: 11/29/2021 5:02:56 PM By: Lorrin Jackson Entered By: Lorrin Jackson on 11/29/2021 15:39:53 -------------------------------------------------------------------------------- Lower Extremity Assessment Details Patient Name: Date of Service: [name], [name] 11/29/2021 2:45 PM Medical Record Number: [id number] Patient Account Number: [id number] Date of Birth/Sex: Treating RN: [birth year] (71 y.o. [name] Primary Care Dayshon Roback: [name] Other Clinician: Referring Khila Papp: Treating Zygmunt Mcglinn/Extender: [name] Weeks in Treatment: 5 Edema Assessment Assessed: [Left: Yes] [Right: No] Edema: [Left: N] [Right: o] Calf Left: Right: Point of Measurement: 34 cm From Medial Instep 51 cm Ankle Left: Right: Point of Measurement: 9 cm From Medial Instep 23.5 cm Vascular Assessment Pulses: Dorsalis Pedis Palpable: [Left:Yes] Electronic Signature(s) Signed: 11/29/2021 5:02:56 PM By: Lorrin Jackson Entered By: Lorrin Jackson on 11/29/2021 15:12:26 -------------------------------------------------------------------------------- Multi Wound Chart Details Patient Name: Date of Service: [name], Holly RA H A. 11/29/2021 2:45 PM Medical Record Number: [id number] Patient Account Number: [id number] Date of Birth/Sex: Treating RN: 1950/12/13 (71 y.o. [name] Primary Care Ajaya Crutchfield: [name] Other Clinician: Referring Nigil Braman: Treating Judge Duque/Extender: [name] Weeks in Treatment: 5 Vital Signs Height(in): Capillary Blood Glucose(mg/dl): 189 Weight(lbs): Pulse(bpm):  58 Body Mass Index(BMI): Blood Pressure(mmHg): 136/77 Temperature(F): 98.3 Respiratory Rate(breaths/min): 18 Photos: [4:Left, Plantar Metatarsal head first] [N/A:N/A N/A] Wound Location: [4:Gradually Appeared] [N/A:N/A] Wounding Event: [4:Diabetic Wound/Ulcer of the Lower] [N/A:N/A] Primary Etiology: [4:Extremity Cataracts, Sleep Apnea, Hypertension, N/A] Comorbid History: [4:Type II Diabetes, Osteoarthritis, Osteomyelitis, Neuropathy, Received Radiation, Confinement Anxiety 10/02/2020] [N/A:N/A] Date Acquired: [4:5] [N/A:N/A] Weeks of Treatment: [4:Open] [N/A:N/A] Wound Status: [4:No] [N/A:N/A] Wound Recurrence: [4:0.7x0.4x0.2] [N/A:N/A] Measurements L x W x D (cm) [4:0.22] [N/A:N/A] A (cm) : rea [4:0.044] [N/A:N/A] Volume (cm) : [4:46.10%] [N/A:N/A] % Reduction in A [4:rea: 64.20%] [N/A:N/A] %  Reduction in Volume: [4:8] Starting Position 1 (o'clock): [4:11] Ending Position 1 (o'clock): [4:0.7] Maximum Distance 1 (cm): [4:Yes] [N/A:N/A] Undermining: [4:Grade 1] [N/A:N/A] Classification: [4:Medium] [N/A:N/A] Exudate A mount: [4:Serosanguineous] [N/A:N/A] Exudate Type: [4:red, brown] [N/A:N/A] Exudate Color: [4:Distinct, outline attached] [N/A:N/A] Wound Margin: [4:Large (67-100%)] [N/A:N/A] Granulation A mount: [4:Red] [N/A:N/A] Granulation Quality: [4:Small (1-33%)] [N/A:N/A] Necrotic A mount: [4:Fat Layer (Subcutaneous Tissue): Yes N/A] Exposed Structures: [4:Fascia: No Tendon: No Muscle: No Joint: No Bone: No Medium (34-66%)] [N/A:N/A] Epithelialization: [4:Debridement - Excisional] [N/A:N/A] Debridement: Pre-procedure Verification/Time Out 15:19 [N/A:N/A] Taken: [4:Callus, Subcutaneous, Slough] [N/A:N/A] Tissue Debrided: [4:Skin/Subcutaneous Tissue] [N/A:N/A] Level: [4:0.28] [N/A:N/A] Debridement A (sq cm): [4:rea Curette] [N/A:N/A] Instrument: [4:Minimum] [N/A:N/A] Bleeding: [4:Pressure] [N/A:N/A] Hemostasis A chieved: [4:Procedure was tolerated well]  [N/A:N/A] Debridement Treatment Response: [4:0.7x0.4x0.2] [N/A:N/A] Post Debridement Measurements L x W x D (cm) [4:0.044] [N/A:N/A] Post Debridement Volume: (cm) [4:Calloused Periwound] [N/A:N/A] Assessment Notes: [4:Debridement] [N/A:N/A] Treatment Notes Electronic Signature(s) Signed: 11/29/2021 4:17:37 PM By: [name] DO Signed: 11/29/2021 5:02:56 PM By: Lorrin Jackson Entered By: [name] on 11/29/2021 15:33:39 -------------------------------------------------------------------------------- Multi-Disciplinary Care Plan Details Patient Name: Date of Service: [name], Holly RA H A. 11/29/2021 2:45 PM Medical Record Number: [id number] Patient Account Number: [id number] Date of Birth/Sex: Treating RN: 04/13/1950 (71 y.o. [name] Primary Care Mckinnley Cottier: [name] Other Clinician: Referring Emeka Lindner: Treating Madelin Weseman/Extender: [name] in Treatment: 5 Active Inactive Nutrition Nursing Diagnoses: Impaired glucose control: actual or potential Goals: Patient/caregiver verbalizes understanding of need to maintain therapeutic glucose control per primary care physician Date Initiated: 10/21/2021 Target Resolution Date: 12/14/2021 Goal Status: Active Interventions: Assess HgA1c results as ordered upon admission and as needed Provide education on elevated blood sugars and impact on wound healing Notes: Wound/Skin Impairment Nursing Diagnoses: Impaired tissue integrity Goals: Patient/caregiver will verbalize understanding of skin care regimen Date Initiated: 10/21/2021 Target Resolution Date: 12/14/2021 Goal Status: Active Ulcer/skin breakdown will have a volume reduction of 30% by week 4 Date Initiated: 10/21/2021 Date Inactivated: 11/29/2021 Target Resolution Date: 11/18/2021 Goal Status: Met Interventions: Assess patient/caregiver ability to obtain necessary supplies Assess patient/caregiver ability to perform ulcer/skin care  regimen upon admission and as needed Assess ulceration(s) every visit Provide education on ulcer and skin care Treatment Activities: Topical wound management initiated : 10/21/2021 Notes: 11/29/21: Wound care regimen continues. Running IVR for skin sub Electronic Signature(s) Signed: 11/29/2021 5:02:56 PM By: Lorrin Jackson Entered By: Lorrin Jackson on 11/29/2021 15:30:14 -------------------------------------------------------------------------------- Pain Assessment Details Patient Name: Date of Service: [name], [name] 11/29/2021 2:45 PM Medical Record Number: [id number] Patient Account Number: [id number] Date of Birth/Sex: Treating RN: Feb 02, 1951 (71 y.o. [name] Primary Care Dorlisa Savino: [name] Other Clinician: Referring Aadi Bordner: Treating Kellye Mizner/Extender: [name] Weeks in Treatment: 5 Active Problems Location of Pain Severity and Description of Pain Patient Has Paino No Site Locations Pain Management and Medication Current Pain Management: Electronic Signature(s) Signed: 11/29/2021 5:02:56 PM By: Lorrin Jackson Entered By: Lorrin Jackson on 11/29/2021 15:05:20 -------------------------------------------------------------------------------- Patient/Caregiver Education Details Patient Name: Date of Service: Holly Alar A. 9/11/2023andnbsp2:45 PM Medical Record Number: [id number] Patient Account Number: [id number] Date of Birth/Gender: Treating RN: 1950-09-17 (71 y.o. [name] Primary Care Physician: [name] Other Clinician: Referring Physician: Treating Physician/Extender: [name] in Treatment: 5 Education Assessment Education Provided To: Patient Education Topics Provided Elevated Blood Sugar/ Impact on Healing: Methods: Explain/Verbal, Printed Responses: State content correctly Offloading: Methods: Explain/Verbal, Printed Responses: State content  correctly Wound/Skin Impairment: Methods:  Explain/Verbal, Printed Responses: State content correctly Electronic Signature(s) Signed: 11/29/2021 5:02:56 PM By: Lorrin Jackson Entered By: Lorrin Jackson on 11/29/2021 15:30:41 -------------------------------------------------------------------------------- Wound Assessment Details Patient Name: Date of Service: [name], Holly RA H A. 11/29/2021 2:45 PM Medical Record Number: [id number] Patient Account Number: [id number] Date of Birth/Sex: Treating RN: 1950/06/09 (71 y.o. [name] Primary Care Gaylia Kassel: [name] Other Clinician: Referring Adonys Wildes: Treating Verlon Pischke/Extender: [name] Weeks in Treatment: 5 Wound Status Wound Number: 4 Primary Diabetic Wound/Ulcer of the Lower Extremity Etiology: Wound Location: Left, Plantar Metatarsal head first Wound Open Wounding Event: Gradually Appeared Status: Date Acquired: 10/02/2020 Comorbid Cataracts, Sleep Apnea, Hypertension, Type II Diabetes, Weeks Of Treatment: 5 History: Osteoarthritis, Osteomyelitis, Neuropathy, Received Radiation, Clustered Wound: No Confinement Anxiety Photos Wound Measurements Length: (cm) 0.8 Width: (cm) 0.7 Depth: (cm) 0.2 Area: (cm) 0.44 Volume: (cm) 0.088 % Reduction in Area: -7.8% % Reduction in Volume: 28.5% Epithelialization: Medium (34-66%) Tunneling: No Undermining: No Wound Description Classification: Grade 1 Wound Margin: Distinct, outline attached Exudate Amount: Medium Exudate Type: Serosanguineous Exudate Color: red, brown Foul Odor After Cleansing: No Slough/Fibrino Yes Wound Bed Granulation Amount: Large (67-100%) Exposed Structure Granulation Quality: Red Fascia Exposed: No Necrotic Amount: Small (1-33%) Fat Layer (Subcutaneous Tissue) Exposed: Yes Tendon Exposed: No Muscle Exposed: No Joint Exposed: No Bone Exposed: No Assessment Notes Calloused Periwound Treatment Notes Wound #4  (Metatarsal head first) Wound Laterality: Plantar, Left Cleanser Soap and Water Discharge Instruction: May shower and wash wound with dial antibacterial soap and water prior to dressing change. Peri-Wound Care Skin Prep Discharge Instruction: Use skin prep as directed Topical Primary Dressing MediHoney Gel, tube 1.5 (oz) Discharge Instruction: Apply until Regranex obtained Optifoam Non-Adhesive Dressing, 4x4 in Discharge Instruction: Apply as donut around wound Secondary Dressing ALLEVYN Gentle Border, 3x3 (in/in) Discharge Instruction: Apply over primary dressing as directed. Secured With Compression Wrap Compression Stockings Environmental education officer) Signed: 11/29/2021 5:02:56 PM By: Lorrin Jackson Entered By: Lorrin Jackson on 11/29/2021 15:35:15 -------------------------------------------------------------------------------- Vitals Details Patient Name: Date of Service: [name], Holly RA H A. 11/29/2021 2:45 PM Medical Record Number: [id number] Patient Account Number: [id number] Date of Birth/Sex: Treating RN: June 05, 1950 (71 y.o. [name] Primary Care Lemon Whitacre: [name] Other Clinician: Referring Jahna Liebert: Treating Nzinga Ferran/Extender: [name] Weeks in Treatment: 5 Vital Signs Time Taken: 15:02 Temperature (F): 98.3 Pulse (bpm): 74 Respiratory Rate (breaths/min): 18 Blood Pressure (mmHg): 136/77 Capillary Blood Glucose (mg/dl): 189 Reference Range: 80 - 120 mg / dl Electronic Signature(s) Signed: 11/29/2021 5:02:56 PM By: Lorrin Jackson Entered By: Lorrin Jackson on 11/29/2021 15:04:51

## 2021-11-29 NOTE — Progress Notes (Signed)
LEAHA, CUERVO ([id number]) Visit Report for 11/29/2021 Chief Complaint Document Details Patient Name: Date of Service: [name] 11/29/2021 2:45 PM Medical Record Number: [id number] Patient Account Number: [id number] Date of Birth/Sex: Treating RN: [birth year] (71 y.o. [name] Primary Care Provider: [name] Other Clinician: Referring Provider: Treating Provider/Extender: [name] in Treatment: 5 Information Obtained from: Patient Chief Complaint Left plantar ulcer 11/12/2019; patient is here referred by podiatry for review of a wound on the left first plantar metatarsal head 03/30/2021; right second toe wound s/p Trauma 10/21/2021; First left metatarsal head wound Electronic Signature(s) Signed: 11/29/2021 4:17:37 PM By: [name] DO Entered By: [name] on 11/29/2021 15:35:14 -------------------------------------------------------------------------------- Debridement Details Patient Name: Date of Service: [name], [name] 11/29/2021 2:45 PM Medical Record Number: [id number] Patient Account Number: [id number] Date of Birth/Sex: Treating RN: October 23, 1950 (71 y.o. [name] Primary Care Provider: [name] Other Clinician: Referring Provider: Treating Provider/Extender: [name] Weeks in Treatment: 5 Debridement Performed for Assessment: Wound #4 Left,Plantar Metatarsal head first Performed By: Physician [name], DO Debridement Type: Debridement Severity of Tissue Pre Debridement: Fat layer exposed Level of Consciousness (Pre-procedure): Awake and Alert Pre-procedure Verification/Time Out Yes - 15:19 Taken: Start Time: 15:20 T Area Debrided (L x W): otal 0.8 (cm) x 0.7 (cm) = 0.56 (cm) Tissue and other material debrided: Non-Viable, Callus, Slough, Subcutaneous, Slough Level: Skin/Subcutaneous Tissue Debridement Description: Excisional Instrument:  Curette Bleeding: Minimum Hemostasis Achieved: Pressure End Time: 15:24 Response to Treatment: Procedure was tolerated well Level of Consciousness (Post- Awake and Alert procedure): Post Debridement Measurements of Total Wound Length: (cm) 0.8 Width: (cm) 0.7 Depth: (cm) 0.2 Volume: (cm) 0.088 Character of Wound/Ulcer Post Debridement: Stable Severity of Tissue Post Debridement: Fat layer exposed Post Procedure Diagnosis Same as Pre-procedure Electronic Signature(s) Signed: 11/29/2021 4:17:37 PM By: [name] DO Signed: 11/29/2021 5:02:56 PM By: Lorrin Jackson Entered By: Lorrin Jackson on 11/29/2021 15:35:37 -------------------------------------------------------------------------------- HPI Details Patient Name: Date of Service: [name], [name] 11/29/2021 2:45 PM Medical Record Number: [id number] Patient Account Number: [id number] Date of Birth/Sex: Treating RN: 1950/05/24 (71 y.o. [name] Primary Care Provider: [name] Other Clinician: Referring Provider: Treating Provider/Extender: [name] in Treatment: 5 History of Present Illness HPI Description: 05/10/17 patient presents today for initial evaluation and our clinic concerning issues that she has been having with an ulceration on the plantar aspect of the left first metatarsal head. She has been seen a podiatrist Dr. Jacqualyn Posey for this since August 2018. Subsequently he has recommended bunion surgery as that seems to be the culprit for why there is pressure and friction occurring at the site causing the callous and subsequently the wound. 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 With that being said I do have notes from several of his visits one of which does include an x-ray documentation stating that she had a negative x-ray. There  was obviously the bunion the notes I have extended from 11 deformity. With that being said she has not had an MRI fortunately this wound does not probe to bone. The notes I have extended from February 08, 2017 through April 14, 2017. During that course she was also placed on antibiotics to help with an infection two weeks ago and this was doxycycline.  Fortunately that seems to have completely resolved any infection issues that she may have had. Unfortunately this is an ulcer that she has actually been dealing with since April 2018. She just initially was trying to treat it and care for it on her own. Her most recent hemoglobin A1c was 5.8, she is a non-smoker, she does have a right first toe amputation and a left third toe amputation. Fortunately she's not having any discomfort at the site. 05/17/17 on evaluation today patient's ulcer on the plantar aspect of the first metatarsal region appears to be close at this point. There does not appear to be any evidence of ulcer opening. This is on initial inspection. She has not really had any discomfort but honestly she really was not experiencing a lot of pain even before. There is no evidence of infection or fluctuation under the callous which is present at this point. ADMISSION 11/12/2019 This is a 71 year old woman that we previously had in this clinic in 2017 for two visits with a wound I think roughly in the same area. At that point she had a bunion in the left. She has apparently had surgery on this since then. Looking through care everywhere we can see that she has had follow-up with podiatry from 06/12/2019 through 10/28/2019 for a wound on the plantar aspect of her left first MTP. She states when this started she was having severe knee pain on the right which forced her to alter her gait. She has recently received doxycycline which she is finished and Bactroban. She is back to using Medihoney on the wound. She is offloading with the Pegasys  shoe. Past medical history includes type 2 diabetes with a recent hemoglobin A1c of 6.8, severe type II diabetic neuropathy, history of a CVA, stage III chronic renal failure hypertension, depression, of bilateral previous bunion surgery, right first toe amputation and a partial left third toe amputation for osteomyelitis. The patient states that she recently had work to do in her mother's house in [place] and so she was on her feet quite a lot which delayed the healing. ABI in our clinic was 1.1 on the left 8/31; plantar aspect of her left first metatarsal head. Using silver collagen. This is probably a surgical wound 9/14; this is a patient who had a wound on the plantar aspect of her left first metatarsal head in the setting of a significant bunion deformity. We use cervical collagen and a offloading shoe. She has significant diabetic neuropathy probably some degree of a Charcot deformity in her feet. This is closed over today. Readmission 03/30/2021 Ms. Maryclare Nydam is a 71 year old female with a past medical history of insulin-dependent controlled type 2 diabetes s/p right great toe amputation, right knee osteoarthritis and hypertension that presents to the clinic for a second right toe wound. She states she was getting out of the bathtub when her foot got caught the tub and the skin to her second toe was split open. She noticed this after the bath when she saw blood on the floor. She has neuropathy in her feet. She visited the ED following the event on 03/26/2021. They obtained x-rays that showed a fracture to the fifth toe but no fracture to the second toe. She was started on doxycycline. She currently denies signs of infection. She has been keeping the area covered with a Band-Aid. Readmission 10/21/2021 Ms. Tayja Azucena is a 71 year old female with a past medical history of insulin dependent controlled type 2 diabetes complicated by peripheral  neuropathy that presents to the clinic for a  left met head wound. She has been following with Dr. Earleen Newport, podiatry for over a year for this issue. She has tried collagen, mupirocin ointment and Medihoney. She is at high fall risk and cannot tolerate a total contact cast. She has orthotics with specialized inserts for her current issue. She currently denies signs of infection. 8/10; patient presents for follow-up. Unfortunately Regranex was unaffordable at $1200. She has been using Medihoney to the wound bed. She reports aggressively offloading the area. She has offloading inserts. She denies signs of infection. 8/22; patient presents for follow-up. She has been using Medihoney to the wound bed with improvement in wound healing. She has no issues or complaints today. She will be out of town starting next week for the next 2 weeks. 9/11; patient presents for follow-up. She has been using Medihoney to the wound bed. She went on vacation and was last seen 3 weeks ago. She states she had tried to aggressively offload the wound bed. Electronic Signature(s) Signed: 11/29/2021 4:17:37 PM By: [name] DO Entered By: [name] on 11/29/2021 15:35:44 -------------------------------------------------------------------------------- Physical Exam Details Patient Name: Date of Service: [name], [name] 11/29/2021 2:45 PM Medical Record Number: [id number] Patient Account Number: [id number] Date of Birth/Sex: Treating RN: [birth year] (71 y.o. [name] Primary Care Provider: [name] Other Clinician: Referring Provider: Treating Provider/Extender: [name] Weeks in Treatment: 5 Constitutional respirations regular, non-labored and within target range for patient.. Cardiovascular 2+ dorsalis pedis/posterior tibialis pulses. Psychiatric pleasant and cooperative. Notes Left foot: T the left first metatarsal there is an open wound with granulation tissue, Nonviable tissue and callus. No increased  warmth, erythema or purulent o drainage. Electronic Signature(s) Signed: 11/29/2021 4:17:37 PM By: [name] DO Entered By: [name] on 11/29/2021 15:36:14 -------------------------------------------------------------------------------- Physician Orders Details Patient Name: Date of Service: [name], [name] 11/29/2021 2:45 PM Medical Record Number: [id number] Patient Account Number: [id number] Date of Birth/Sex: Treating RN: 1950-10-07 (71 y.o. [name] Primary Care Provider: [name] Other Clinician: Referring Provider: Treating Provider/Extender: [name] in Treatment: 5 Verbal / Phone Orders: No Diagnosis Coding Follow-up Appointments ppointment in 2 weeks. - 12/14/21 with Dr. Heber Atkinson and Leveda Anna, RN (Room 7) Return A Other: - Prism=Supplies Anesthetic (In clinic) Topical Lidocaine 5% applied to wound bed (In clinic) Topical Lidocaine 4% applied to wound bed Bathing/ Shower/ Hygiene May shower and wash wound with soap and water. Edema Control - Lymphedema / SCD / Other Avoid standing for long periods of time. Off-Loading Other: - Diabetic Shoes Relieve pressure/time standing of feet Additional Orders / Instructions Follow Nutritious Diet - Monitor/Control Blood Sugars Non Wound Condition Other Non Wound Condition Orders/Instructions: - Apply corn pad or gauze between great toe and 2nd toe. Wound Treatment Wound #4 - Metatarsal head first Wound Laterality: Plantar, Left Cleanser: Soap and Water 1 x Per Day/30 Days Discharge Instructions: May shower and wash wound with dial antibacterial soap and water prior to dressing change. Peri-Wound Care: Skin Prep (Generic) 1 x Per Day/30 Days Discharge Instructions: Use skin prep as directed Prim Dressing: MediHoney Gel, tube 1.5 (oz) 1 x Per Day/30 Days ary Discharge Instructions: Apply until Regranex obtained Prim Dressing: Optifoam Non-Adhesive Dressing, 4x4 in 1 x Per  Day/30 Days ary Discharge Instructions: Apply as donut around wound Secondary Dressing: ALLEVYN Gentle Border, 3x3 (in/in) (Generic) 1 x Per Day/30 Days Discharge Instructions: Apply over primary dressing  as directed. Electronic Signature(s) Signed: 11/29/2021 4:17:37 PM By: [name] DO Entered By: [name] on 11/29/2021 15:36:22 -------------------------------------------------------------------------------- Problem List Details Patient Name: Date of Service: [name], [name] 11/29/2021 2:45 PM Medical Record Number: [id number] Patient Account Number: [id number] Date of Birth/Sex: Treating RN: Oct 23, 1950 (71 y.o. [name] Primary Care Provider: [name] Other Clinician: Referring Provider: Treating Provider/Extender: [name] Weeks in Treatment: 5 Active Problems ICD-10 Encounter Code Description Active Date MDM Diagnosis L97.522 Non-pressure chronic ulcer of other part of left foot with fat layer exposed 10/21/2021 No Yes E11.621 Type 2 diabetes mellitus with foot ulcer 10/21/2021 No Yes E11.42 Type 2 diabetes mellitus with diabetic polyneuropathy 10/21/2021 No Yes Inactive Problems Resolved Problems Electronic Signature(s) Signed: 11/29/2021 4:17:37 PM By: [name] DO Signed: 11/29/2021 4:17:37 PM By: [name] DO Entered By: [name] on 11/29/2021 15:33:34 -------------------------------------------------------------------------------- Progress Note Details Patient Name: Date of Service: [name], [name] 11/29/2021 2:45 PM Medical Record Number: [id number] Patient Account Number: [id number] Date of Birth/Sex: Treating RN: 1951/03/15 (71 y.o. [name] Primary Care Provider: [name] Other Clinician: Referring Provider: Treating Provider/Extender: [name] in Treatment: 5 Subjective Chief Complaint Information obtained from Patient Left plantar ulcer  11/12/2019; patient is here referred by podiatry for review of a wound on the left first plantar metatarsal head 03/30/2021; right second toe wound s/p Trauma 10/21/2021; First left metatarsal head wound History of Present Illness (HPI) 05/10/17 patient presents today for initial evaluation and our clinic concerning issues that she has been having with an ulceration on the plantar aspect of the left first metatarsal head. She has been seen a podiatrist Dr. Jacqualyn Posey for this since August 2018. Subsequently he has recommended bunion surgery as that seems to be the culprit for why there is pressure and friction occurring at the site causing the callous and subsequently the wound. 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 With that being said I do have notes from several of his visits one of which does include an x-ray documentation stating that she had a negative x-ray. There was obviously the bunion the notes I have extended from 11 deformity. With that being said she has not had an MRI fortunately this wound does not probe to bone. The notes I have extended from February 08, 2017 through April 14, 2017. During that course she was also placed on antibiotics to help with an infection two weeks ago and this was doxycycline. Fortunately that seems to have completely resolved any infection issues that she may have had. Unfortunately this is an ulcer that she has actually been dealing with since April 2018. She just initially was trying to treat it and care for it on her own. Her most recent hemoglobin A1c was 5.8, she is a non-smoker, she does have a right first toe amputation and a left third toe amputation. Fortunately she's not having any discomfort at the site. 05/17/17 on evaluation today patient's ulcer on the plantar aspect of the first metatarsal region appears to be  close at this point. There does not appear to be any evidence of ulcer opening. This is on initial inspection. She has not really had any discomfort but honestly she really was not experiencing a lot of pain even before. There is no evidence of infection or  fluctuation under the callous which is present at this point. ADMISSION 11/12/2019 This is a 71 year old woman that we previously had in this clinic in 2017 for two visits with a wound I think roughly in the same area. At that point she had a bunion in the left. She has apparently had surgery on this since then. Looking through care everywhere we can see that she has had follow-up with podiatry from 06/12/2019 through 10/28/2019 for a wound on the plantar aspect of her left first MTP. She states when this started she was having severe knee pain on the right which forced her to alter her gait. She has recently received doxycycline which she is finished and Bactroban. She is back to using Medihoney on the wound. She is offloading with the Pegasys shoe. Past medical history includes type 2 diabetes with a recent hemoglobin A1c of 6.8, severe type II diabetic neuropathy, history of a CVA, stage III chronic renal failure hypertension, depression, of bilateral previous bunion surgery, right first toe amputation and a partial left third toe amputation for osteomyelitis. The patient states that she recently had work to do in her mother's house in [place] and so she was on her feet quite a lot which delayed the healing. ABI in our clinic was 1.1 on the left 8/31; plantar aspect of her left first metatarsal head. Using silver collagen. This is probably a surgical wound 9/14; this is a patient who had a wound on the plantar aspect of her left first metatarsal head in the setting of a significant bunion deformity. We use cervical collagen and a offloading shoe. She has significant diabetic neuropathy probably some degree of a Charcot deformity in her feet.  This is closed over today. Readmission 03/30/2021 Ms. Lunetta Marina is a 71 year old female with a past medical history of insulin-dependent controlled type 2 diabetes s/p right great toe amputation, right knee osteoarthritis and hypertension that presents to the clinic for a second right toe wound. She states she was getting out of the bathtub when her foot got caught the tub and the skin to her second toe was split open. She noticed this after the bath when she saw blood on the floor. She has neuropathy in her feet. She visited the ED following the event on 03/26/2021. They obtained x-rays that showed a fracture to the fifth toe but no fracture to the second toe. She was started on doxycycline. She currently denies signs of infection. She has been keeping the area covered with a Band-Aid. Readmission 10/21/2021 Ms. Jeannie Yinger is a 71 year old female with a past medical history of insulin dependent controlled type 2 diabetes complicated by peripheral neuropathy that presents to the clinic for a left met head wound. She has been following with Dr. Earleen Newport, podiatry for over a year for this issue. She has tried collagen, mupirocin ointment and Medihoney. She is at high fall risk and cannot tolerate a total contact cast. She has orthotics with specialized inserts for her current issue. She currently denies signs of infection. 8/10; patient presents for follow-up. Unfortunately Regranex was unaffordable at $1200. She has been using Medihoney to the wound bed. She reports aggressively offloading the area. She has offloading inserts. She denies signs of infection. 8/22; patient presents for follow-up. She has been using Medihoney to the wound bed with improvement in wound healing. She has no issues or complaints today. She will be out of town starting next week for the next 2 weeks. 9/11; patient presents for follow-up.  She has been using Medihoney to the wound bed. She went on vacation and was last seen 3  weeks ago. She states she had tried to aggressively offload the wound bed. Patient History Information obtained from Patient. Family History Cancer - Father, Hypertension - Mother,Father,Siblings, Stroke - Mother,Father, No family history of Diabetes, Heart Disease, Hereditary Spherocytosis, Kidney Disease, Lung Disease, Seizures, Thyroid Problems, Tuberculosis. Social History Never smoker, Marital Status - Single, Alcohol Use - Never, Drug Use - No History, Caffeine Use - Rarely. Medical History Eyes Patient has history of Cataracts Denies history of Glaucoma, Optic Neuritis Ear/Nose/Mouth/Throat Denies history of Chronic sinus problems/congestion, Middle ear problems Hematologic/Lymphatic Denies history of Anemia, Hemophilia, Human Immunodeficiency Virus, Lymphedema, Sickle Cell Disease Respiratory Patient has history of Sleep Apnea Denies history of Aspiration, Asthma, Chronic Obstructive Pulmonary Disease (COPD), Pneumothorax, Tuberculosis Cardiovascular Patient has history of Hypertension Denies history of Angina, Arrhythmia, Congestive Heart Failure, Coronary Artery Disease, Deep Vein Thrombosis, Hypotension, Myocardial Infarction, Peripheral Arterial Disease, Peripheral Venous Disease, Phlebitis, Vasculitis Gastrointestinal Denies history of Cirrhosis , Colitis, Crohnoos, Hepatitis A, Hepatitis B, Hepatitis C Endocrine Patient has history of Type II Diabetes Denies history of Type I Diabetes Genitourinary Denies history of End Stage Renal Disease Immunological Denies history of Lupus Erythematosus, Raynaudoos, Scleroderma Integumentary (Skin) Denies history of History of Burn Musculoskeletal Patient has history of Osteoarthritis, Osteomyelitis - Hx in 2007 (left 3rd toe) Denies history of Gout, Rheumatoid Arthritis Neurologic Patient has history of Neuropathy Denies history of Dementia, Quadriplegia, Paraplegia, Seizure Disorder Oncologic Patient has history of  Received Radiation - 03/2020 Denies history of Received Chemotherapy Psychiatric Patient has history of Confinement Anxiety Denies history of Anorexia/bulimia Hospitalization/Surgery History - Amputation of right great toe. - Tendon repair. - Hysterectomy. - Osteomyelitis left 3rd toe. - cervical fusion with cage. - left breast lumpectomy. Medical A Surgical History Notes nd Constitutional Symptoms (General Health) morbid obesity Cardiovascular Takotsubo cardiomyopathy 2012, hyperlipidemia, Hx CVA, Tachycardia Genitourinary CKD stage 3 Musculoskeletal left leg weakness Neurologic CVA Oncologic hx endometrial cancer, left breast CA Psychiatric depression Objective Constitutional respirations regular, non-labored and within target range for patient.. Vitals Time Taken: 3:02 PM, Temperature: 98.3 F, Pulse: 74 bpm, Respiratory Rate: 18 breaths/min, Blood Pressure: 136/77 mmHg, Capillary Blood Glucose: 189 mg/dl. Cardiovascular 2+ dorsalis pedis/posterior tibialis pulses. Psychiatric pleasant and cooperative. General Notes: Left foot: T the left first metatarsal there is an open wound with granulation tissue, Nonviable tissue and callus. No increased warmth, o erythema or purulent drainage. Integumentary (Hair, Skin) Wound #4 status is Open. Original cause of wound was Gradually Appeared. The date acquired was: 10/02/2020. The wound has been in treatment 5 weeks. The wound is located on the Left,Plantar Metatarsal head first. The wound measures 0.8cm length x 0.7cm width x 0.2cm depth; 0.44cm^2 area and 0.088cm^3 volume. There is Fat Layer (Subcutaneous Tissue) exposed. There is no tunneling or undermining noted. There is a medium amount of serosanguineous drainage noted. The wound margin is distinct with the outline attached to the wound base. There is large (67-100%) red granulation within the wound bed. There is a small (1-33%) amount of necrotic tissue within the wound  bed. General Notes: Calloused Periwound Assessment Active Problems ICD-10 Non-pressure chronic ulcer of other part of left foot with fat layer exposed Type 2 diabetes mellitus with foot ulcer Type 2 diabetes mellitus with diabetic polyneuropathy Patient's wound has declined in size and appearance since last clinic visit. She was last seen 3 weeks ago due to being on vacation. No  signs of infection on exam. I debrided nonviable tissue. I recommended Medihoney to the wound bed and aggressive offloading. Unfortunately she has not a candidate for the total contact cast due to high fall risk. Regranix was too expensive. At this time I recommended a skin substitute. Patient was agreeable with this. We will run an IVR for this. Procedures Wound #4 Pre-procedure diagnosis of Wound #4 is a Diabetic Wound/Ulcer of the Lower Extremity located on the Left,Plantar Metatarsal head first .Severity of Tissue Pre Debridement is: Fat layer exposed. There was a Excisional Skin/Subcutaneous Tissue Debridement with a total area of 0.56 sq cm performed by [name], DO. With the following instrument(s): Curette to remove Non-Viable tissue/material. Material removed includes Callus, Subcutaneous Tissue, and Slough. No specimens were taken. A time out was conducted at 15:19, prior to the start of the procedure. A Minimum amount of bleeding was controlled with Pressure. The procedure was tolerated well. Post Debridement Measurements: 0.8cm length x 0.7cm width x 0.2cm depth; 0.088cm^3 volume. Character of Wound/Ulcer Post Debridement is stable. Severity of Tissue Post Debridement is: Fat layer exposed. Post procedure Diagnosis Wound #4: Same as Pre-Procedure Plan Follow-up Appointments: Return Appointment in 2 weeks. - 12/14/21 with Dr. Heber Ackworth and Leveda Anna, RN (Room 7) Other: - Prism=Supplies Anesthetic: (In clinic) Topical Lidocaine 5% applied to wound bed (In clinic) Topical Lidocaine 4% applied to wound  bed Bathing/ Shower/ Hygiene: May shower and wash wound with soap and water. Edema Control - Lymphedema / SCD / Other: Avoid standing for long periods of time. Off-Loading: Other: - Diabetic Shoes Relieve pressure/time standing of feet Additional Orders / Instructions: Follow Nutritious Diet - Monitor/Control Blood Sugars Non Wound Condition: Other Non Wound Condition Orders/Instructions: - Apply corn pad or gauze between great toe and 2nd toe. WOUND #4: - Metatarsal head first Wound Laterality: Plantar, Left Cleanser: Soap and Water 1 x Per Day/30 Days Discharge Instructions: May shower and wash wound with dial antibacterial soap and water prior to dressing change. Peri-Wound Care: Skin Prep (Generic) 1 x Per Day/30 Days Discharge Instructions: Use skin prep as directed Prim Dressing: MediHoney Gel, tube 1.5 (oz) 1 x Per Day/30 Days ary Discharge Instructions: Apply until Regranex obtained Prim Dressing: Optifoam Non-Adhesive Dressing, 4x4 in 1 x Per Day/30 Days ary Discharge Instructions: Apply as donut around wound Secondary Dressing: ALLEVYN Gentle Border, 3x3 (in/in) (Generic) 1 x Per Day/30 Days Discharge Instructions: Apply over primary dressing as directed. 1. In office sharp debridement 2. Medihoney 3. Aggressive offloadingoooffloading foam donut 4. Run IVR for skin substitute 5. Follow-up in 2 weeks Electronic Signature(s) Signed: 11/29/2021 4:17:37 PM By: [name] DO Entered By: [name] on 11/29/2021 15:39:54 -------------------------------------------------------------------------------- HxROS Details Patient Name: Date of Service: [name], [name] 11/29/2021 2:45 PM Medical Record Number: [id number] Patient Account Number: [id number] Date of Birth/Sex: Treating RN: 1950-11-07 (71 y.o. [name] Primary Care Provider: [name] Other Clinician: Referring Provider: Treating Provider/Extender: [name]  in Treatment: 5 Information Obtained From Patient Constitutional Symptoms (General Health) Medical History: Past Medical History Notes: morbid obesity Eyes Medical History: Positive for: Cataracts Negative for: Glaucoma; Optic Neuritis Ear/Nose/Mouth/Throat Medical History: Negative for: Chronic sinus problems/congestion; Middle ear problems Hematologic/Lymphatic Medical History: Negative for: Anemia; Hemophilia; Human Immunodeficiency Virus; Lymphedema; Sickle Cell Disease Respiratory Medical History: Positive for: Sleep Apnea Negative for: Aspiration; Asthma; Chronic Obstructive Pulmonary Disease (COPD); Pneumothorax; Tuberculosis Cardiovascular Medical History: Positive for: Hypertension Negative for: Angina; Arrhythmia; Congestive Heart Failure; Coronary Artery  Disease; Deep Vein Thrombosis; Hypotension; Myocardial Infarction; Peripheral Arterial Disease; Peripheral Venous Disease; Phlebitis; Vasculitis Past Medical History Notes: Takotsubo cardiomyopathy 2012, hyperlipidemia, Hx CVA, Tachycardia Gastrointestinal Medical History: Negative for: Cirrhosis ; Colitis; Crohns; Hepatitis A; Hepatitis B; Hepatitis C Endocrine Medical History: Positive for: Type II Diabetes Negative for: Type I Diabetes Time with diabetes: over 20 years Treated with: Insulin, Oral agents Blood sugar tested every day: Yes Tested : daily Genitourinary Medical History: Negative for: End Stage Renal Disease Past Medical History Notes: CKD stage 3 Immunological Medical History: Negative for: Lupus Erythematosus; Raynauds; Scleroderma Integumentary (Skin) Medical History: Negative for: History of Burn Musculoskeletal Medical History: Positive for: Osteoarthritis; Osteomyelitis - Hx in 2007 (left 3rd toe) Negative for: Gout; Rheumatoid Arthritis Past Medical History Notes: left leg weakness Neurologic Medical History: Positive for: Neuropathy Negative for: Dementia; Quadriplegia;  Paraplegia; Seizure Disorder Past Medical History Notes: CVA Oncologic Medical History: Positive for: Received Radiation - 03/2020 Negative for: Received Chemotherapy Past Medical History Notes: hx endometrial cancer, left breast CA Psychiatric Medical History: Positive for: Confinement Anxiety Negative for: Anorexia/bulimia Past Medical History Notes: depression HBO Extended History Items Eyes: Cataracts Immunizations Pneumococcal Vaccine: Received Pneumococcal Vaccination: Yes Received Pneumococcal Vaccination On or After 60th Birthday: Yes Implantable Devices None Hospitalization / Surgery History Type of Hospitalization/Surgery Amputation of right great toe Tendon repair Hysterectomy Osteomyelitis left 3rd toe cervical fusion with cage left breast lumpectomy Family and Social History Cancer: Yes - Father; Diabetes: No; Heart Disease: No; Hereditary Spherocytosis: No; Hypertension: Yes - Mother,Father,Siblings; Kidney Disease: No; Lung Disease: No; Seizures: No; Stroke: Yes - Mother,Father; Thyroid Problems: No; Tuberculosis: No; Never smoker; Marital Status - Single; Alcohol Use: Never; Drug Use: No History; Caffeine Use: Rarely; Financial Concerns: No; Food, Clothing or Shelter Needs: No; Support System Lacking: No; Transportation Concerns: No Electronic Signature(s) Signed: 11/29/2021 4:17:37 PM By: [name] DO Signed: 11/29/2021 5:02:56 PM By: Lorrin Jackson Entered By: [name] on 11/29/2021 15:35:49 -------------------------------------------------------------------------------- SuperBill Details Patient Name: Date of Service: [name], [name] 11/29/2021 Medical Record Number: [id number] Patient Account Number: [id number] Date of Birth/Sex: Treating RN: December 24, 1950 (71 y.o. [name] Primary Care Provider: [name] Other Clinician: Referring Provider: Treating Provider/Extender: [name] Weeks in  Treatment: 5 Diagnosis Coding ICD-10 Codes Code Description [phone] Non-pressure chronic ulcer of other part of left foot with fat layer exposed E11.621 Type 2 diabetes mellitus with foot ulcer E11.42 Type 2 diabetes mellitus with diabetic polyneuropathy Facility Procedures CPT4 Code: [id number] Description: 50932 - DEB SUBQ TISSUE 20 SQ CM/< ICD-10 Diagnosis Description L97.522 Non-pressure chronic ulcer of other part of left foot with fat layer exposed Modifier: Quantity: 1 Physician Procedures : CPT4 Code Description Modifier [id number] - WC PHYS SUBQ TISS 20 SQ CM ICD-10 Diagnosis Description L97.522 Non-pressure chronic ulcer of other part of left foot with fat layer exposed Quantity: 1 Electronic Signature(s) Signed: 11/29/2021 4:17:37 PM By: [name] DO Entered By: [name] on 11/29/2021 15:40:17

## 2021-11-30 DIAGNOSIS — L97522 Non-pressure chronic ulcer of other part of left foot with fat layer exposed: Secondary | ICD-10-CM | POA: Diagnosis not present

## 2021-12-01 ENCOUNTER — Ambulatory Visit
Admission: RE | Admit: 2021-12-01 | Discharge: 2021-12-01 | Disposition: A | Discharge status: Home / Self Care | Payer: PPO | Source: Ambulatory Visit | Attending: Family Medicine | Admitting: Family Medicine

## 2021-12-01 DIAGNOSIS — R0989 Other specified symptoms and signs involving the circulatory and respiratory systems: Secondary | ICD-10-CM | POA: Diagnosis not present

## 2021-12-02 DIAGNOSIS — Z0289 Encounter for other administrative examinations: Secondary | ICD-10-CM | POA: Diagnosis not present

## 2021-12-10 DIAGNOSIS — E113292 Type 2 diabetes mellitus with mild nonproliferative diabetic retinopathy without macular edema, left eye: Secondary | ICD-10-CM | POA: Diagnosis not present

## 2021-12-10 DIAGNOSIS — H34831 Tributary (branch) retinal vein occlusion, right eye, with macular edema: Secondary | ICD-10-CM | POA: Diagnosis not present

## 2021-12-10 DIAGNOSIS — E113211 Type 2 diabetes mellitus with mild nonproliferative diabetic retinopathy with macular edema, right eye: Secondary | ICD-10-CM | POA: Diagnosis not present

## 2021-12-14 ENCOUNTER — Encounter (HOSPITAL_BASED_OUTPATIENT_CLINIC_OR_DEPARTMENT_OTHER): Payer: PPO | Admitting: Internal Medicine

## 2021-12-14 DIAGNOSIS — L97522 Non-pressure chronic ulcer of other part of left foot with fat layer exposed: Secondary | ICD-10-CM | POA: Diagnosis not present

## 2021-12-14 DIAGNOSIS — E11621 Type 2 diabetes mellitus with foot ulcer: Secondary | ICD-10-CM | POA: Diagnosis not present

## 2021-12-15 NOTE — Progress Notes (Signed)
[name], [name] ([id number]) Visit Report for 12/14/2021 Arrival Information Details Patient Name: Date of Service: [name] 12/14/2021 3:30 PM Medical Record Number: [id number] Patient Account Number: [id number] Date of Birth/Sex: Treating RN: [birth year] (71 y.o. [name] Primary Care Corde Antonini: [name] Other Clinician: Referring Ellery Tash: Treating Jacklin Zwick/Extender: [name] in Treatment: 7 Visit Information History Since Last Visit Added or deleted any medications: No Patient Arrived: [name] Any new allergies or adverse reactions: No Arrival Time: 15:52 Had a fall or experienced change in No Accompanied By: self activities of daily living that may affect Transfer Assistance: None risk of falls: Patient Identification Verified: Yes Signs or symptoms of abuse/neglect since last visito No Secondary Verification Process Completed: Yes Hospitalized since last visit: No Patient Requires Transmission-Based Precautions: No Implantable device outside of the clinic excluding No Patient Has Alerts: Yes cellular tissue based products placed in the center Patient Alerts: ABI's R=1.07 L=1.02 since last visit: Has Dressing in Place as Prescribed: Yes Pain Present Now: No Electronic Signature(s) Signed: 12/15/2021 10:38:22 AM By: Erenest Blank Entered By: Erenest Blank on 12/14/2021 15:53:49 -------------------------------------------------------------------------------- Encounter Discharge Information Details Patient Name: Date of Service: [name], Holly RA H A. 12/14/2021 3:30 PM Medical Record Number: [id number] Patient Account Number: [id number] Date of Birth/Sex: Treating RN: [birth year] (71 y.o. [name] Primary Care Emmaly Leech: [name] Other Clinician: Referring Jonluke Cobbins: Treating Heyden Jaber/Extender: [name] in Treatment: 7 Encounter Discharge Information Items Post Procedure  Vitals Discharge Condition: Stable Temperature (F): 97.9 Ambulatory Status: Cane Pulse (bpm): 80 Discharge Destination: Home Respiratory Rate (breaths/min): 18 Transportation: Private Auto Blood Pressure (mmHg): 137/83 Accompanied By: self Schedule Follow-up Appointment: Yes Clinical Summary of Care: Patient Declined Electronic Signature(s) Signed: 12/14/2021 5:13:12 PM By: Sharyn Creamer RN, BSN Entered By: Sharyn Creamer on 12/14/2021 16:47:49 -------------------------------------------------------------------------------- Lower Extremity Assessment Details Patient Name: Date of Service: [name], [name] 12/14/2021 3:30 PM Medical Record Number: [id number] Patient Account Number: [id number] Date of Birth/Sex: Treating RN: [birth year] (71 y.o. [name] Primary Care Marcea Rojek: [name] Other Clinician: Referring Adryana Mogensen: Treating Lashandra Arauz/Extender: [name] Weeks in Treatment: 7 Edema Assessment Assessed: [Left: No] [Right: No] Edema: [Left: N] [Right: o] Calf Left: Right: Point of Measurement: 34 cm From Medial Instep 47.5 cm Ankle Left: Right: Point of Measurement: 9 cm From Medial Instep 28 cm Vascular Assessment Pulses: Dorsalis Pedis Palpable: [Left:Yes] Electronic Signature(s) Signed: 12/15/2021 10:38:22 AM By: Erenest Blank Signed: 12/15/2021 4:59:35 PM By: Lorrin Jackson Entered By: Erenest Blank on 12/14/2021 16:00:12 -------------------------------------------------------------------------------- Multi Wound Chart Details Patient Name: Date of Service: [name], Holly RA H A. 12/14/2021 3:30 PM Medical Record Number: [id number] Patient Account Number: [id number] Date of Birth/Sex: Treating RN: 05/10/1950 (71 y.o. [name] Primary Care Litha Lamartina: [name] Other Clinician: Referring Gerald Kuehl: Treating Jahnya Trindade/Extender: [name] Weeks in Treatment: 7 Vital  Signs Height(in): Capillary Blood Glucose(mg/dl): 187 Weight(lbs): Pulse(bpm): 63 Body Mass Index(BMI): Blood Pressure(mmHg): 137/83 Temperature(F): 97.9 Respiratory Rate(breaths/min): 18 Photos: [4:Left, Plantar Metatarsal head first] [N/A:N/A N/A] Wound Location: [4:Gradually Appeared] [N/A:N/A] Wounding Event: [4:Diabetic Wound/Ulcer of the Lower] [N/A:N/A] Primary Etiology: [4:Extremity Cataracts, Sleep Apnea, Hypertension, N/A] Comorbid History: [4:Type II Diabetes, Osteoarthritis, Osteomyelitis, Neuropathy, Received Radiation, Confinement Anxiety 10/02/2020] [N/A:N/A] Date Acquired: [4:7] [N/A:N/A] Weeks of Treatment: [4:Open] [N/A:N/A] Wound Status: [4:No] [N/A:N/A] Wound Recurrence: [4:0.6x0.5x0.2] [N/A:N/A] Measurements L x W x D (cm) [4:0.236] [N/A:N/A] A (cm) : rea [4:0.047] [N/A:N/A] Volume (cm) : [4:42.20%] [  N/A:N/A] % Reduction in A [4:rea: 61.80%] [N/A:N/A] % Reduction in Volume: [4:2] Starting Position 1 (o'clock): [4:5] Ending Position 1 (o'clock): [4:0.3] Maximum Distance 1 (cm): [4:Yes] [N/A:N/A] Undermining: [4:Grade 1] [N/A:N/A] Classification: [4:Medium] [N/A:N/A] Exudate A mount: [4:Serosanguineous] [N/A:N/A] Exudate Type: [4:red, brown] [N/A:N/A] Exudate Color: [4:Distinct, outline attached] [N/A:N/A] Wound Margin: [4:Large (67-100%)] [N/A:N/A] Granulation A mount: [4:Red] [N/A:N/A] Granulation Quality: [4:Small (1-33%)] [N/A:N/A] Necrotic A mount: [4:Fat Layer (Subcutaneous Tissue): Yes N/A] Exposed Structures: [4:Fascia: No Tendon: No Muscle: No Joint: No Bone: No Medium (34-66%)] [N/A:N/A] Epithelialization: [4:Debridement - Excisional] [N/A:N/A] Debridement: Pre-procedure Verification/Time Out 16:20 [N/A:N/A] Taken: [4:Subcutaneous, Slough] [N/A:N/A] Tissue Debrided: [4:Skin/Subcutaneous Tissue] [N/A:N/A] Level: [4:0.3] [N/A:N/A] Debridement A (sq cm): [4:rea Curette] [N/A:N/A] Instrument: [4:Minimum] [N/A:N/A] Bleeding: [4:Pressure]  [N/A:N/A] Hemostasis A chieved: [4:0] [N/A:N/A] Procedural Pain: [4:0] [N/A:N/A] Post Procedural Pain: [4:Procedure was tolerated well] [N/A:N/A] Debridement Treatment Response: [4:0.6x0.5x0.2] [N/A:N/A] Post Debridement Measurements L x W x D (cm) [4:0.047] [N/A:N/A] Post Debridement Volume: (cm) [4:Debridement] [N/A:N/A] Treatment Notes Wound #4 (Metatarsal head first) Wound Laterality: Plantar, Left Cleanser Soap and Water Discharge Instruction: May shower and wash wound with dial antibacterial soap and water prior to dressing change. Peri-Wound Care Skin Prep Discharge Instruction: Use skin prep as directed Topical Primary Dressing MediHoney Gel, tube 1.5 (oz) Discharge Instruction: Apply until Regranex obtained Optifoam Non-Adhesive Dressing, 4x4 in Discharge Instruction: Apply as donut around wound Secondary Dressing ALLEVYN Gentle Border, 3x3 (in/in) Discharge Instruction: Apply over primary dressing as directed. Secured With Compression Wrap Compression Stockings Environmental education officer) Signed: 12/14/2021 4:43:30 PM By: [name] DO Signed: 12/15/2021 4:59:35 PM By: Fara Chute By: [name] on 12/14/2021 16:37:51 -------------------------------------------------------------------------------- Multi-Disciplinary Care Plan Details Patient Name: Date of Service: [name], Holly RA H A. 12/14/2021 3:30 PM Medical Record Number: [id number] Patient Account Number: [id number] Date of Birth/Sex: Treating RN: 1950/11/22 (71 y.o. [name] Primary Care Kemia Wendel: [name] Other Clinician: Referring Stacee Earp: Treating Cristy Colmenares/Extender: [name] in Treatment: 7 Active Inactive Nutrition Nursing Diagnoses: Impaired glucose control: actual or potential Goals: Patient/caregiver verbalizes understanding of need to maintain therapeutic glucose control per primary care physician Date Initiated:  10/21/2021 Target Resolution Date: 12/14/2021 Goal Status: Active Interventions: Assess HgA1c results as ordered upon admission and as needed Provide education on elevated blood sugars and impact on wound healing Notes: Wound/Skin Impairment Nursing Diagnoses: Impaired tissue integrity Goals: Patient/caregiver will verbalize understanding of skin care regimen Date Initiated: 10/21/2021 Target Resolution Date: 12/14/2021 Goal Status: Active Ulcer/skin breakdown will have a volume reduction of 30% by week 4 Date Initiated: 10/21/2021 Date Inactivated: 11/29/2021 Target Resolution Date: 11/18/2021 Goal Status: Met Interventions: Assess patient/caregiver ability to obtain necessary supplies Assess patient/caregiver ability to perform ulcer/skin care regimen upon admission and as needed Assess ulceration(s) every visit Provide education on ulcer and skin care Treatment Activities: Topical wound management initiated : 10/21/2021 Notes: 11/29/21: Wound care regimen continues. Running IVR for skin sub Electronic Signature(s) Signed: 12/14/2021 5:13:12 PM By: Sharyn Creamer RN, BSN Signed: 12/15/2021 4:59:35 PM By: Lorrin Jackson Entered By: Sharyn Creamer on 12/14/2021 16:47:10 -------------------------------------------------------------------------------- Pain Assessment Details Patient Name: Date of Service: [name], [name] 12/14/2021 3:30 PM Medical Record Number: [id number] Patient Account Number: [id number] Date of Birth/Sex: Treating RN: Jul 03, 1950 (71 y.o. [name] Primary Care Tahlor Berenguer: [name] Other Clinician: Referring Bartt Gonzaga: Treating Rakesha Dalporto/Extender: [name] Weeks in Treatment: 7 Active Problems Location of Pain Severity and Description of Pain Patient Has Paino No Site Locations Pain Management  and Medication Current Pain Management: Electronic Signature(s) Signed: 12/14/2021 4:43:35 PM By: Erenest Blank Signed: 12/15/2021  4:59:35 PM By: Fara Chute By: Erenest Blank on 12/14/2021 15:54:56 -------------------------------------------------------------------------------- Patient/Caregiver Education Details Patient Name: Date of Service: [name] 9/26/2023andnbsp3:30 PM Medical Record Number: [id number] Patient Account Number: [id number] Date of Birth/Gender: Treating RN: 1950-06-27 (71 y.o. [name] Primary Care Physician: [name] Other Clinician: Referring Physician: Treating Physician/Extender: [name] in Treatment: 7 Education Assessment Education Provided To: Patient Education Topics Provided Wound/Skin Impairment: Methods: Explain/Verbal Responses: State content correctly Electronic Signature(s) Signed: 12/14/2021 5:13:12 PM By: Sharyn Creamer RN, BSN Previous Signature: 12/14/2021 4:43:35 PM Version By: Erenest Blank Entered By: Sharyn Creamer on 12/14/2021 16:47:21 -------------------------------------------------------------------------------- Wound Assessment Details Patient Name: Date of Service: [name], [name] 12/14/2021 3:30 PM Medical Record Number: [id number] Patient Account Number: [id number] Date of Birth/Sex: Treating RN: [birth year] (71 y.o. [name] Primary Care Almyra Birman: [name] Other Clinician: Referring Jessie Schrieber: Treating Kentley Blyden/Extender: [name] Weeks in Treatment: 7 Wound Status Wound Number: 4 Primary Diabetic Wound/Ulcer of the Lower Extremity Etiology: Wound Location: Left, Plantar Metatarsal head first Wound Open Wounding Event: Gradually Appeared Status: Date Acquired: 10/02/2020 Comorbid Cataracts, Sleep Apnea, Hypertension, Type II Diabetes, Weeks Of Treatment: 7 History: Osteoarthritis, Osteomyelitis, Neuropathy, Received Radiation, Clustered Wound: No Confinement Anxiety Photos Wound Measurements Length: (cm) 0.6 Width: (cm) 0.5 Depth:  (cm) 0.2 Area: (cm) 0.236 Volume: (cm) 0.047 % Reduction in Area: 42.2% % Reduction in Volume: 61.8% Epithelialization: Medium (34-66%) Tunneling: No Undermining: Yes Starting Position (o'clock): 2 Ending Position (o'clock): 5 Maximum Distance: (cm) 0.3 Wound Description Classification: Grade 1 Wound Margin: Distinct, outline attached Exudate Amount: Medium Exudate Type: Serosanguineous Exudate Color: red, brown Foul Odor After Cleansing: No Slough/Fibrino Yes Wound Bed Granulation Amount: Large (67-100%) Exposed Structure Granulation Quality: Red Fascia Exposed: No Necrotic Amount: Small (1-33%) Fat Layer (Subcutaneous Tissue) Exposed: Yes Necrotic Quality: Adherent Slough Tendon Exposed: No Muscle Exposed: No Joint Exposed: No Bone Exposed: No Treatment Notes Wound #4 (Metatarsal head first) Wound Laterality: Plantar, Left Cleanser Soap and Water Discharge Instruction: May shower and wash wound with dial antibacterial soap and water prior to dressing change. Peri-Wound Care Skin Prep Discharge Instruction: Use skin prep as directed Topical Primary Dressing MediHoney Gel, tube 1.5 (oz) Discharge Instruction: Apply until Regranex obtained Optifoam Non-Adhesive Dressing, 4x4 in Discharge Instruction: Apply as donut around wound Secondary Dressing ALLEVYN Gentle Border, 3x3 (in/in) Discharge Instruction: Apply over primary dressing as directed. Secured With Compression Wrap Compression Stockings Environmental education officer) Signed: 12/14/2021 5:13:12 PM By: Sharyn Creamer RN, BSN Entered By: Sharyn Creamer on 12/14/2021 15:58:52 -------------------------------------------------------------------------------- Vitals Details Patient Name: Date of Service: [name], Holly RA H A. 12/14/2021 3:30 PM Medical Record Number: [id number] Patient Account Number: [id number] Date of Birth/Sex: Treating RN: [birth year] (71 y.o. [name] Primary Care Duncan Alejandro:  [name] Other Clinician: Referring Alayna Mabe: Treating Stefanie Hodgens/Extender: [name] Weeks in Treatment: 7 Vital Signs Time Taken: 15:53 Temperature (F): 97.9 Pulse (bpm): 80 Respiratory Rate (breaths/min): 18 Blood Pressure (mmHg): 137/83 Capillary Blood Glucose (mg/dl): 187 Reference Range: 80 - 120 mg / dl Electronic Signature(s) Signed: 12/14/2021 4:43:35 PM By: Erenest Blank Entered By: Erenest Blank on 12/14/2021 15:54:50

## 2021-12-20 NOTE — Progress Notes (Signed)
Holly, [name] ([id number]) Visit Report for 12/14/2021 Chief Complaint Document Details Patient Name: Date of Service: [name] 12/14/2021 3:30 PM Medical Record Number: [id number] Patient Account Number: [id number] Date of Birth/Sex: Treating RN: 21-Jun-1950 (71 y.o. [name] Primary Care Provider: [name] Other Clinician: Referring Provider: Treating Provider/Extender: [name] in Treatment: 7 Information Obtained from: Patient Chief Complaint Left plantar ulcer 11/12/2019; patient is here referred by podiatry for review of [name] wound on the left first plantar metatarsal head 03/30/2021; right second toe wound s/p Trauma 10/21/2021; First left metatarsal head wound Electronic Signature(s) Signed: 12/14/2021 4:43:30 PM By: [name] DO Entered By: [name] on 12/14/2021 16:38:01 -------------------------------------------------------------------------------- Debridement Details Patient Name: Date of Service: [name], [name]. 12/14/2021 3:30 PM Medical Record Number: [id number] Patient Account Number: [id number] Date of Birth/Sex: Treating RN: 03/02/1951 (71 y.o. Donalda Ewings Primary Care Provider: [name] Other Clinician: Referring Provider: Treating Provider/Extender: [name] Weeks in Treatment: 7 Debridement Performed for Assessment: Wound #4 Left,Plantar Metatarsal head first Performed By: Physician [name], DO Debridement Type: Debridement Severity of Tissue Pre Debridement: Fat layer exposed Level of Consciousness (Pre-procedure): Awake and Alert Pre-procedure Verification/Time Out Yes - 16:20 Taken: Start Time: 16:25 T Area Debrided (L x W): otal 0.6 (cm) x 0.5 (cm) = 0.3 (cm) Tissue and other material debrided: Non-Viable, Callus, Slough, Subcutaneous, Slough Level: Skin/Subcutaneous Tissue Debridement Description: Excisional Instrument:  Curette Bleeding: Minimum Hemostasis Achieved: Pressure Procedural Pain: 0 Post Procedural Pain: 0 Response to Treatment: Procedure was tolerated well Level of Consciousness (Post- Awake and Alert procedure): Post Debridement Measurements of Total Wound Length: (cm) 0.6 Width: (cm) 0.5 Depth: (cm) 0.2 Volume: (cm) 0.047 Character of Wound/Ulcer Post Debridement: Improved Severity of Tissue Post Debridement: Fat layer exposed Post Procedure Diagnosis Same as Pre-procedure Electronic Signature(s) Signed: 12/14/2021 5:13:12 PM By: Sharyn Creamer RN, BSN Signed: 12/20/2021 1:44:59 PM By: [name] DO Previous Signature: 12/14/2021 4:43:30 PM Version By: [name] DO Entered By: Sharyn Creamer on 12/14/2021 16:49:03 -------------------------------------------------------------------------------- HPI Details Patient Name: Date of Service: [name], [name]. 12/14/2021 3:30 PM Medical Record Number: [id number] Patient Account Number: [id number] Date of Birth/Sex: Treating RN: 05-09-1950 (71 y.o. [name] Primary Care Provider: [name] Other Clinician: Referring Provider: Treating Provider/Extender: [name] in Treatment: 7 History of Present Illness HPI Description: 05/10/17 patient presents today for initial evaluation and our clinic concerning issues that she has been having with an ulceration on the plantar aspect of the left first metatarsal head. She has been seen [name] for this since August 2018. Subsequently he has recommended bunion surgery as that seems to be the culprit for why there is pressure and friction occurring at the site causing the callous and subsequently the wound. 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 With that being said I do have notes from  several of his visits one of which does include an x-ray documentation stating that she had [name] negative x-ray. There was obviously the bunion the notes I have extended from 11 deformity. With that being said she has not had an MRI fortunately this wound does not probe to bone. The notes I have extended from February 08, 2017 through April 14, 2017. During that course she was  also placed on antibiotics to help with an infection two weeks ago and this was doxycycline. Fortunately that seems to have completely resolved any infection issues that she may have had. Unfortunately this is an ulcer that she has actually been dealing with since April 2018. She just initially was trying to treat it and care for it on her own. Her most recent hemoglobin A1c was 5.8, she is [name] non-smoker, she does have [name] right first toe amputation and [name] left third toe amputation. Fortunately she's not having any discomfort at the site. 05/17/17 on evaluation today patient's ulcer on the plantar aspect of the first metatarsal region appears to be close at this point. There does not appear to be any evidence of ulcer opening. This is on initial inspection. She has not really had any discomfort but honestly she really was not experiencing [name] lot of pain even before. There is no evidence of infection or fluctuation under the callous which is present at this point. ADMISSION 11/12/2019 This is [name] 71 year old woman that we previously had in this clinic in 2017 for two visits with [name] wound I think roughly in the same area. At that point she had [name] bunion in the left. She has apparently had surgery on this since then. Looking through care everywhere we can see that she has had follow-up with podiatry from 06/12/2019 through 10/28/2019 for [name] wound on the plantar aspect of her left first MTP. She states when this started she was having severe knee pain on the right which forced her to alter her gait. She has recently received doxycycline which she  is finished and Bactroban. She is back to using Medihoney on the wound. She is offloading with the Pegasys shoe. Past medical history includes type 2 diabetes with [name] recent hemoglobin A1c of 6.8, severe type II diabetic neuropathy, history of [name] CVA, stage III chronic renal failure hypertension, depression, of bilateral previous bunion surgery, right first toe amputation and [name] partial left third toe amputation for osteomyelitis. The patient states that she recently had work to do in her mother's house in [place] and so she was on her feet quite [name] lot which delayed the healing. ABI in our clinic was 1.1 on the left 8/31; plantar aspect of her left first metatarsal head. Using silver collagen. This is probably [name] surgical wound 9/14; this is [name] patient who had [name] wound on the plantar aspect of her left first metatarsal head in the setting of [name] significant bunion deformity. We use cervical collagen and [name] offloading shoe. She has significant diabetic neuropathy probably some degree of [name] Charcot deformity in her feet. This is closed over today. Readmission 03/30/2021 Ms. Marlyss Cissell is [name] 71 year old female with [name] past medical history of insulin-dependent controlled type 2 diabetes s/p right great toe amputation, right knee osteoarthritis and hypertension that presents to the clinic for [name] second right toe wound. She states she was getting out of the bathtub when her foot got caught the tub and the skin to her second toe was split open. She noticed this after the bath when she saw blood on the floor. She has neuropathy in her feet. She visited the ED following the event on 03/26/2021. They obtained x-rays that showed [name] fracture to the fifth toe but no fracture to the second toe. She was started on doxycycline. She currently denies signs of infection. She has been keeping the area covered with [name] Band-Aid. Readmission 10/21/2021 Ms. Rhema Jeudy is [name] 71 year old  female with [name] past medical history of insulin  dependent controlled type 2 diabetes complicated by peripheral neuropathy that presents to the clinic for [name] left met head wound. She has been following with Dr. Earleen Newport, podiatry for over [name] year for this issue. She has tried collagen, mupirocin ointment and Medihoney. She is at high fall risk and cannot tolerate [name] total contact cast. She has orthotics with specialized inserts for her current issue. She currently denies signs of infection. 8/10; patient presents for follow-up. Unfortunately Regranex was unaffordable at $1200. She has been using Medihoney to the wound bed. She reports aggressively offloading the area. She has offloading inserts. She denies signs of infection. 8/22; patient presents for follow-up. She has been using Medihoney to the wound bed with improvement in wound healing. She has no issues or complaints today. She will be out of town starting next week for the next 2 weeks. 9/11; patient presents for follow-up. She has been using Medihoney to the wound bed. She went on vacation and was last seen 3 weeks ago. She states she had tried to aggressively offload the wound bed. 9/26; patient presents for follow-up. She has been using Medihoney to the wound bed. She has no issues or complaints today. Electronic Signature(s) Signed: 12/14/2021 4:43:30 PM By: [name] DO Entered By: [name] on 12/14/2021 16:38:23 -------------------------------------------------------------------------------- Physical Exam Details Patient Name: Date of Service: [name], [name] RA H [name]. 12/14/2021 3:30 PM Medical Record Number: [id number] Patient Account Number: [id number] Date of Birth/Sex: Treating RN: 29-Jun-1950 (71 y.o. [name] Primary Care Provider: [name] Other Clinician: Referring Provider: Treating Provider/Extender: [name] Weeks in Treatment: 7 Constitutional respirations regular, non-labored and within target range for  patient.. Cardiovascular 2+ dorsalis pedis/posterior tibialis pulses. Psychiatric pleasant and cooperative. Notes : Left foot: T the left first metatarsal there is an open wound with granulation tissue, Nonviable tissue and callus. No increased warmth, erythema or purulent o drainage. Electronic Signature(s) Signed: 12/14/2021 4:43:30 PM By: [name] DO Entered By: [name] on 12/14/2021 16:38:46 -------------------------------------------------------------------------------- Physician Orders Details Patient Name: Date of Service: [name], [name]. 12/14/2021 3:30 PM Medical Record Number: [id number] Patient Account Number: [id number] Date of Birth/Sex: Treating RN: 1951-01-31 (71 y.o. [name] Primary Care Provider: [name] Other Clinician: Referring Provider: Treating Provider/Extender: [name] in Treatment: 7 Verbal / Phone Orders: No Diagnosis Coding Follow-up Appointments ppointment in 2 weeks. - Dr. Heber [place] and Leveda Anna, RN (Room 7) Return [name] Other: - Prism=Supplies Anesthetic (In clinic) Topical Lidocaine 5% applied to wound bed (In clinic) Topical Lidocaine 4% applied to wound bed Bathing/ Shower/ Hygiene May shower and wash wound with soap and water. Edema Control - Lymphedema / SCD / Other Avoid standing for long periods of time. Off-Loading Other: - Diabetic Shoes Relieve pressure/time standing of feet Additional Orders / Instructions Follow Nutritious Diet - Monitor/Control Blood Sugars Non Wound Condition Other Non Wound Condition Orders/Instructions: - Apply corn pad or gauze between great toe and 2nd toe. Wound Treatment Wound #4 - Metatarsal head first Wound Laterality: Plantar, Left Cleanser: Soap and Water 1 x Per Day/30 Days Discharge Instructions: May shower and wash wound with dial antibacterial soap and water prior to dressing change. Peri-Wound Care: Skin Prep (Generic) 1 x Per Day/30  Days Discharge Instructions: Use skin prep as directed Prim Dressing: MediHoney Gel, tube 1.5 (oz) 1 x Per Day/30 Days ary Discharge Instructions: Apply until Regranex obtained Prim Dressing: Optifoam  Non-Adhesive Dressing, 4x4 in 1 x Per Day/30 Days ary Discharge Instructions: Apply as donut around wound Secondary Dressing: ALLEVYN Gentle Border, 3x3 (in/in) (Generic) 1 x Per Day/30 Days Discharge Instructions: Apply over primary dressing as directed. Electronic Signature(s) Signed: 12/14/2021 5:13:12 PM By: Sharyn Creamer RN, BSN Signed: 12/20/2021 1:44:59 PM By: [name] DO Previous Signature: 12/14/2021 4:43:30 PM Version By: [name] DO Entered By: Sharyn Creamer on 12/14/2021 16:47:05 -------------------------------------------------------------------------------- Problem List Details Patient Name: Date of Service: [name], [name] RA H [name]. 12/14/2021 3:30 PM Medical Record Number: [id number] Patient Account Number: [id number] Date of Birth/Sex: Treating RN: 05/17/1950 (71 y.o. [name] Primary Care Provider: [name] Other Clinician: Referring Provider: Treating Provider/Extender: [name] Weeks in Treatment: 7 Active Problems ICD-10 Encounter Code Description Active Date MDM Diagnosis L97.522 Non-pressure chronic ulcer of other part of left foot with fat layer exposed 10/21/2021 No Yes E11.621 Type 2 diabetes mellitus with foot ulcer 10/21/2021 No Yes E11.42 Type 2 diabetes mellitus with diabetic polyneuropathy 10/21/2021 No Yes Inactive Problems Resolved Problems Electronic Signature(s) Signed: 12/14/2021 4:43:30 PM By: [name] DO Entered By: [name] on 12/14/2021 16:37:45 -------------------------------------------------------------------------------- Progress Note Details Patient Name: Date of Service: [name], [name]. 12/14/2021 3:30 PM Medical Record Number: [id number] Patient Account Number:  [id number] Date of Birth/Sex: Treating RN: [birth year] (71 y.o. [name] Primary Care Provider: [name] Other Clinician: Referring Provider: Treating Provider/Extender: [name] in Treatment: 7 Subjective Chief Complaint Information obtained from Patient Left plantar ulcer 11/12/2019; patient is here referred by podiatry for review of [name] wound on the left first plantar metatarsal head 03/30/2021; right second toe wound s/p Trauma 10/21/2021; First left metatarsal head wound History of Present Illness (HPI) 05/10/17 patient presents today for initial evaluation and our clinic concerning issues that she has been having with an ulceration on the plantar aspect of the left first metatarsal head. She has been seen [name] for this since August 2018. Subsequently he has recommended bunion surgery as that seems to be the culprit for why there is pressure and friction occurring at the site causing the callous and subsequently the wound. 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 With that being said I do have notes from several of his visits one of which does include an x-ray documentation stating that she had [name] negative x-ray. There was obviously the bunion the notes I have extended from 11 deformity. With that being said she has not had an MRI fortunately this wound does not probe to bone. The notes I have extended from February 08, 2017 through April 14, 2017. During that course she was also placed on antibiotics to help with an infection two weeks ago and this was doxycycline. Fortunately that seems to have completely resolved any infection issues that she may have had. Unfortunately this is an ulcer that she has actually been dealing with since April 2018. She just initially was trying to treat it and care  for it on her own. Her most recent hemoglobin A1c was 5.8, she is [name] non-smoker, she does have [name] right first toe amputation and [name] left third toe amputation. Fortunately she's not having any discomfort at the site. 05/17/17 on evaluation today patient's ulcer on the plantar aspect of the first metatarsal region appears to be  close at this point. There does not appear to be any evidence of ulcer opening. This is on initial inspection. She has not really had any discomfort but honestly she really was not experiencing [name] lot of pain even before. There is no evidence of infection or fluctuation under the callous which is present at this point. ADMISSION 11/12/2019 This is [name] 71 year old woman that we previously had in this clinic in 2017 for two visits with [name] wound I think roughly in the same area. At that point she had [name] bunion in the left. She has apparently had surgery on this since then. Looking through care everywhere we can see that she has had follow-up with podiatry from 06/12/2019 through 10/28/2019 for [name] wound on the plantar aspect of her left first MTP. She states when this started she was having severe knee pain on the right which forced her to alter her gait. She has recently received doxycycline which she is finished and Bactroban. She is back to using Medihoney on the wound. She is offloading with the Pegasys shoe. Past medical history includes type 2 diabetes with [name] recent hemoglobin A1c of 6.8, severe type II diabetic neuropathy, history of [name] CVA, stage III chronic renal failure hypertension, depression, of bilateral previous bunion surgery, right first toe amputation and [name] partial left third toe amputation for osteomyelitis. The patient states that she recently had work to do in her mother's house in [place] and so she was on her feet quite [name] lot which delayed the healing. ABI in our clinic was 1.1 on the left 8/31; plantar aspect of her left first metatarsal head. Using silver collagen.  This is probably [name] surgical wound 9/14; this is [name] patient who had [name] wound on the plantar aspect of her left first metatarsal head in the setting of [name] significant bunion deformity. We use cervical collagen and [name] offloading shoe. She has significant diabetic neuropathy probably some degree of [name] Charcot deformity in her feet. This is closed over today. Readmission 03/30/2021 Ms. Sandria Mcenroe is [name] 71 year old female with [name] past medical history of insulin-dependent controlled type 2 diabetes s/p right great toe amputation, right knee osteoarthritis and hypertension that presents to the clinic for [name] second right toe wound. She states she was getting out of the bathtub when her foot got caught the tub and the skin to her second toe was split open. She noticed this after the bath when she saw blood on the floor. She has neuropathy in her feet. She visited the ED following the event on 03/26/2021. They obtained x-rays that showed [name] fracture to the fifth toe but no fracture to the second toe. She was started on doxycycline. She currently denies signs of infection. She has been keeping the area covered with [name] Band-Aid. Readmission 10/21/2021 Ms. Marilla Wolin is [name] 71 year old female with [name] past medical history of insulin dependent controlled type 2 diabetes complicated by peripheral neuropathy that presents to the clinic for [name] left met head wound. She has been following with Dr. Earleen Newport, podiatry for over [name] year for this issue. She has tried collagen, mupirocin ointment and Medihoney. She is at high fall risk and cannot tolerate [name] total contact cast. She has orthotics with specialized inserts for her current issue. She currently denies signs of infection. 8/10; patient presents for follow-up. Unfortunately Regranex was unaffordable at $1200. She has been using Medihoney to the wound bed. She reports aggressively offloading the area. She has offloading inserts. She denies signs  of infection. 8/22; patient presents  for follow-up. She has been using Medihoney to the wound bed with improvement in wound healing. She has no issues or complaints today. She will be out of town starting next week for the next 2 weeks. 9/11; patient presents for follow-up. She has been using Medihoney to the wound bed. She went on vacation and was last seen 3 weeks ago. She states she had tried to aggressively offload the wound bed. 9/26; patient presents for follow-up. She has been using Medihoney to the wound bed. She has no issues or complaints today. Patient History Information obtained from Patient. Family History Cancer - Father, Hypertension - Mother,Father,Siblings, Stroke - Mother,Father, No family history of Diabetes, Heart Disease, Hereditary Spherocytosis, Kidney Disease, Lung Disease, Seizures, Thyroid Problems, Tuberculosis. Social History Never smoker, Marital Status - Single, Alcohol Use - Never, Drug Use - No History, Caffeine Use - Rarely. Medical History Eyes Patient has history of Cataracts Denies history of Glaucoma, Optic Neuritis Ear/Nose/Mouth/Throat Denies history of Chronic sinus problems/congestion, Middle ear problems Hematologic/Lymphatic Denies history of Anemia, Hemophilia, Human Immunodeficiency Virus, Lymphedema, Sickle Cell Disease Respiratory Patient has history of Sleep Apnea Denies history of Aspiration, Asthma, Chronic Obstructive Pulmonary Disease (COPD), Pneumothorax, Tuberculosis Cardiovascular Patient has history of Hypertension Denies history of Angina, Arrhythmia, Congestive Heart Failure, Coronary Artery Disease, Deep Vein Thrombosis, Hypotension, Myocardial Infarction, Peripheral Arterial Disease, Peripheral Venous Disease, Phlebitis, Vasculitis Gastrointestinal Denies history of Cirrhosis , Colitis, Crohnoos, Hepatitis [name], Hepatitis B, Hepatitis C Endocrine Patient has history of Type II Diabetes Denies history of Type I Diabetes Genitourinary Denies history of End Stage  Renal Disease Immunological Denies history of Lupus Erythematosus, Raynaudoos, Scleroderma Integumentary (Skin) Denies history of History of Burn Musculoskeletal Patient has history of Osteoarthritis, Osteomyelitis - Hx in 2007 (left 3rd toe) Denies history of Gout, Rheumatoid Arthritis Neurologic Patient has history of Neuropathy Denies history of Dementia, Quadriplegia, Paraplegia, Seizure Disorder Oncologic Patient has history of Received Radiation - 03/2020 Denies history of Received Chemotherapy Psychiatric Patient has history of Confinement Anxiety Denies history of Anorexia/bulimia Hospitalization/Surgery History - Amputation of right great toe. - Tendon repair. - Hysterectomy. - Osteomyelitis left 3rd toe. - cervical fusion with cage. - left breast lumpectomy. Medical [name] Surgical History Notes nd Constitutional Symptoms (General Health) morbid obesity Cardiovascular Takotsubo cardiomyopathy 2012, hyperlipidemia, Hx CVA, Tachycardia Genitourinary CKD stage 3 Musculoskeletal left leg weakness Neurologic CVA Oncologic hx endometrial cancer, left breast CA Psychiatric depression Objective Constitutional respirations regular, non-labored and within target range for patient.. Vitals Time Taken: 3:53 PM, Temperature: 97.9 F, Pulse: 80 bpm, Respiratory Rate: 18 breaths/min, Blood Pressure: 137/83 mmHg, Capillary Blood Glucose: 187 mg/dl. Cardiovascular 2+ dorsalis pedis/posterior tibialis pulses. Psychiatric pleasant and cooperative. General Notes: : Left foot: T the left first metatarsal there is an open wound with granulation tissue, Nonviable tissue and callus. No increased warmth, o erythema or purulent drainage. Integumentary (Hair, Skin) Wound #4 status is Open. Original cause of wound was Gradually Appeared. The date acquired was: 10/02/2020. The wound has been in treatment 7 weeks. The wound is located on the Left,Plantar Metatarsal head first. The wound  measures 0.6cm length x 0.5cm width x 0.2cm depth; 0.236cm^2 area and 0.047cm^3 volume. There is Fat Layer (Subcutaneous Tissue) exposed. There is no tunneling noted, however, there is undermining starting at 2:00 and ending at 5:00 with [name] maximum distance of 0.3cm. There is [name] medium amount of serosanguineous drainage noted. The wound margin is distinct with the outline attached to the wound  base. There is large (67-100%) red granulation within the wound bed. There is [name] small (1-33%) amount of necrotic tissue within the wound bed including Adherent Slough. Assessment Active Problems ICD-10 Non-pressure chronic ulcer of other part of left foot with fat layer exposed Type 2 diabetes mellitus with foot ulcer Type 2 diabetes mellitus with diabetic polyneuropathy Patient's wound is stable. I debrided nonviable tissue. I recommended continuing the course with Medihoney and aggressive offloading. Follow-up in 2 weeks. Procedures Wound #4 Pre-procedure diagnosis of Wound #4 is [name] Diabetic Wound/Ulcer of the Lower Extremity located on the Left,Plantar Metatarsal head first .Severity of Tissue Pre Debridement is: Fat layer exposed. There was [name] Excisional Skin/Subcutaneous Tissue Debridement with [name] total area of 0.3 sq cm performed by [name], DO. With the following instrument(s): Curette to remove Non-Viable tissue/material. Material removed includes Subcutaneous Tissue and Slough and. No specimens were taken. [name] time out was conducted at 16:20, prior to the start of the procedure. [name] Minimum amount of bleeding was controlled with Pressure. The procedure was tolerated well with [name] pain level of 0 throughout and [name] pain level of 0 following the procedure. Post Debridement Measurements: 0.6cm length x 0.5cm width x 0.2cm depth; 0.047cm^3 volume. Character of Wound/Ulcer Post Debridement is improved. Severity of Tissue Post Debridement is: Fat layer exposed. Post procedure Diagnosis Wound #4: Same as  Pre-Procedure Plan Follow-up Appointments: Return Appointment in 2 weeks. - Dr. Heber Del Rio and Leveda Anna, RN (Room 7) Other: - Prism=Supplies Anesthetic: (In clinic) Topical Lidocaine 5% applied to wound bed (In clinic) Topical Lidocaine 4% applied to wound bed Bathing/ Shower/ Hygiene: May shower and wash wound with soap and water. Edema Control - Lymphedema / SCD / Other: Avoid standing for long periods of time. Off-Loading: Other: - Diabetic Shoes Relieve pressure/time standing of feet Additional Orders / Instructions: Follow Nutritious Diet - Monitor/Control Blood Sugars Non Wound Condition: Other Non Wound Condition Orders/Instructions: - Apply corn pad or gauze between great toe and 2nd toe. WOUND #4: - Metatarsal head first Wound Laterality: Plantar, Left Cleanser: Soap and Water 1 x Per Day/30 Days Discharge Instructions: May shower and wash wound with dial antibacterial soap and water prior to dressing change. Peri-Wound Care: Skin Prep (Generic) 1 x Per Day/30 Days Discharge Instructions: Use skin prep as directed Prim Dressing: MediHoney Gel, tube 1.5 (oz) 1 x Per Day/30 Days ary Discharge Instructions: Apply until Regranex obtained Prim Dressing: Optifoam Non-Adhesive Dressing, 4x4 in 1 x Per Day/30 Days ary Discharge Instructions: Apply as donut around wound Secondary Dressing: ALLEVYN Gentle Border, 3x3 (in/in) (Generic) 1 x Per Day/30 Days Discharge Instructions: Apply over primary dressing as directed. 1. In office sharp debridement 2. Medihoney 3. Aggressive offloadingoooffloading foam donut Electronic Signature(s) Signed: 12/14/2021 4:43:30 PM By: [name] DO Entered By: [name] on 12/14/2021 16:41:13 -------------------------------------------------------------------------------- HxROS Details Patient Name: Date of Service: [name], [name]. 12/14/2021 3:30 PM Medical Record Number: [id number] Patient Account Number: [id number] Date of  Birth/Sex: Treating RN: April 04, 1950 (71 y.o. [name] Primary Care Provider: [name] Other Clinician: Referring Provider: Treating Provider/Extender: [name] in Treatment: 7 Information Obtained From Patient Constitutional Symptoms (General Health) Medical History: Past Medical History Notes: morbid obesity Eyes Medical History: Positive for: Cataracts Negative for: Glaucoma; Optic Neuritis Ear/Nose/Mouth/Throat Medical History: Negative for: Chronic sinus problems/congestion; Middle ear problems Hematologic/Lymphatic Medical History: Negative for: Anemia; Hemophilia; Human Immunodeficiency Virus; Lymphedema; Sickle Cell Disease Respiratory Medical History: Positive for: Sleep Apnea Negative for:  Aspiration; Asthma; Chronic Obstructive Pulmonary Disease (COPD); Pneumothorax; Tuberculosis Cardiovascular Medical History: Positive for: Hypertension Negative for: Angina; Arrhythmia; Congestive Heart Failure; Coronary Artery Disease; Deep Vein Thrombosis; Hypotension; Myocardial Infarction; Peripheral Arterial Disease; Peripheral Venous Disease; Phlebitis; Vasculitis Past Medical History Notes: Takotsubo cardiomyopathy 2012, hyperlipidemia, Hx CVA, Tachycardia Gastrointestinal Medical History: Negative for: Cirrhosis ; Colitis; Crohns; Hepatitis [name]; Hepatitis B; Hepatitis C Endocrine Medical History: Positive for: Type II Diabetes Negative for: Type I Diabetes Time with diabetes: over 20 years Treated with: Insulin, Oral agents Blood sugar tested every day: Yes Tested : daily Genitourinary Medical History: Negative for: End Stage Renal Disease Past Medical History Notes: CKD stage 3 Immunological Medical History: Negative for: Lupus Erythematosus; Raynauds; Scleroderma Integumentary (Skin) Medical History: Negative for: History of Burn Musculoskeletal Medical History: Positive for: Osteoarthritis; Osteomyelitis - Hx in  2007 (left 3rd toe) Negative for: Gout; Rheumatoid Arthritis Past Medical History Notes: left leg weakness Neurologic Medical History: Positive for: Neuropathy Negative for: Dementia; Quadriplegia; Paraplegia; Seizure Disorder Past Medical History Notes: CVA Oncologic Medical History: Positive for: Received Radiation - 03/2020 Negative for: Received Chemotherapy Past Medical History Notes: hx endometrial cancer, left breast CA Psychiatric Medical History: Positive for: Confinement Anxiety Negative for: Anorexia/bulimia Past Medical History Notes: depression HBO Extended History Items Eyes: Cataracts Immunizations Pneumococcal Vaccine: Received Pneumococcal Vaccination: Yes Received Pneumococcal Vaccination On or After 60th Birthday: Yes Implantable Devices None Hospitalization / Surgery History Type of Hospitalization/Surgery Amputation of right great toe Tendon repair Hysterectomy Osteomyelitis left 3rd toe cervical fusion with cage left breast lumpectomy Family and Social History Cancer: Yes - Father; Diabetes: No; Heart Disease: No; Hereditary Spherocytosis: No; Hypertension: Yes - Mother,Father,Siblings; Kidney Disease: No; Lung Disease: No; Seizures: No; Stroke: Yes - Mother,Father; Thyroid Problems: No; Tuberculosis: No; Never smoker; Marital Status - Single; Alcohol Use: Never; Drug Use: No History; Caffeine Use: Rarely; Financial Concerns: No; Food, Clothing or Shelter Needs: No; Support System Lacking: No; Transportation Concerns: No Electronic Signature(s) Signed: 12/14/2021 4:43:30 PM By: [name] DO Signed: 12/15/2021 4:59:35 PM By: Lorrin Jackson Entered By: [name] on 12/14/2021 16:38:28 -------------------------------------------------------------------------------- SuperBill Details Patient Name: Date of Service: [name], [name]. 12/14/2021 Medical Record Number: [id number] Patient Account Number: [id number] Date of  Birth/Sex: Treating RN: 27-Mar-1950 (71 y.o. [name] Primary Care Provider: [name] Other Clinician: Referring Provider: Treating Provider/Extender: [name] Weeks in Treatment: 7 Diagnosis Coding ICD-10 Codes Code Description [phone] Non-pressure chronic ulcer of other part of left foot with fat layer exposed E11.621 Type 2 diabetes mellitus with foot ulcer E11.42 Type 2 diabetes mellitus with diabetic polyneuropathy Facility Procedures CPT4 Code: [id number] Description: 25525 - DEB SUBQ TISSUE 20 SQ CM/< ICD-10 Diagnosis Description L97.522 Non-pressure chronic ulcer of other part of left foot with fat layer exposed E11.621 Type 2 diabetes mellitus with foot ulcer Modifier: Quantity: 1 Physician Procedures : CPT4 Code Description Modifier [id number] - WC PHYS SUBQ TISS 20 SQ CM ICD-10 Diagnosis Description L97.522 Non-pressure chronic ulcer of other part of left foot with fat layer exposed E11.621 Type 2 diabetes mellitus with foot ulcer Quantity: 1 Electronic Signature(s) Signed: 12/14/2021 4:43:30 PM By: [name] DO Entered By: [name] on 12/14/2021 16:41:26

## 2021-12-30 DIAGNOSIS — H34831 Tributary (branch) retinal vein occlusion, right eye, with macular edema: Secondary | ICD-10-CM | POA: Diagnosis not present

## 2021-12-30 DIAGNOSIS — H43813 Vitreous degeneration, bilateral: Secondary | ICD-10-CM | POA: Diagnosis not present

## 2021-12-30 DIAGNOSIS — E119 Type 2 diabetes mellitus without complications: Secondary | ICD-10-CM | POA: Diagnosis not present

## 2021-12-31 ENCOUNTER — Encounter (HOSPITAL_BASED_OUTPATIENT_CLINIC_OR_DEPARTMENT_OTHER): Payer: PPO | Attending: Internal Medicine | Admitting: Internal Medicine

## 2021-12-31 DIAGNOSIS — E1142 Type 2 diabetes mellitus with diabetic polyneuropathy: Secondary | ICD-10-CM | POA: Diagnosis not present

## 2021-12-31 DIAGNOSIS — I129 Hypertensive chronic kidney disease with stage 1 through stage 4 chronic kidney disease, or unspecified chronic kidney disease: Secondary | ICD-10-CM | POA: Diagnosis not present

## 2021-12-31 DIAGNOSIS — Z8673 Personal history of transient ischemic attack (TIA), and cerebral infarction without residual deficits: Secondary | ICD-10-CM | POA: Diagnosis not present

## 2021-12-31 DIAGNOSIS — L97522 Non-pressure chronic ulcer of other part of left foot with fat layer exposed: Secondary | ICD-10-CM | POA: Insufficient documentation

## 2021-12-31 DIAGNOSIS — Z89411 Acquired absence of right great toe: Secondary | ICD-10-CM | POA: Diagnosis not present

## 2021-12-31 DIAGNOSIS — E1122 Type 2 diabetes mellitus with diabetic chronic kidney disease: Secondary | ICD-10-CM | POA: Insufficient documentation

## 2021-12-31 DIAGNOSIS — Z89422 Acquired absence of other left toe(s): Secondary | ICD-10-CM | POA: Diagnosis not present

## 2021-12-31 DIAGNOSIS — E1151 Type 2 diabetes mellitus with diabetic peripheral angiopathy without gangrene: Secondary | ICD-10-CM | POA: Diagnosis not present

## 2021-12-31 DIAGNOSIS — E11621 Type 2 diabetes mellitus with foot ulcer: Secondary | ICD-10-CM | POA: Diagnosis not present

## 2021-12-31 DIAGNOSIS — N183 Chronic kidney disease, stage 3 unspecified: Secondary | ICD-10-CM | POA: Insufficient documentation

## 2021-12-31 DIAGNOSIS — Z794 Long term (current) use of insulin: Secondary | ICD-10-CM | POA: Diagnosis not present

## 2021-12-31 NOTE — Progress Notes (Signed)
[name], [name] ([id number]) [id number] Page 1 of 10 Visit Report for 12/31/2021 Chief Complaint Document Details Patient Name: Date of Service: [name]. 12/31/2021 11:00 [name] Medical Record Number: [id number] Patient Account Number: [id number] Date of Birth/Sex: Treating RN: February 14, 1951 (71 y.o. [name] Primary Care Provider: [name] Other Clinician: Referring Provider: Treating Provider/Extender: [name] in Treatment: 10 Information Obtained from: Patient Chief Complaint Left plantar ulcer 11/12/2019; patient is here referred by podiatry for review of [name] wound on the left first plantar metatarsal head 03/30/2021; right second toe wound s/p Trauma 10/21/2021; First left metatarsal head wound Electronic Signature(s) Signed: 12/31/2021 12:13:57 PM By: Holly Shan DO Entered By: [name] on 12/31/2021 11:39:30 -------------------------------------------------------------------------------- Debridement Details Patient Name: Date of Service: [name], [name]. 12/31/2021 11:00 [name] Medical Record Number: [id number] Patient Account Number: [id number] Date of Birth/Sex: Treating RN: 03/17/1951 (71 y.o. [name] Primary Care Provider: [name] Other Clinician: Referring Provider: Treating Provider/Extender: [name] Weeks in Treatment: 10 Debridement Performed for Assessment: Wound #4 Left,Plantar Metatarsal head first Performed By: Physician Holly Shan, DO Debridement Type: Debridement Severity of Tissue Pre Debridement: Fat layer exposed Level of Consciousness (Pre-procedure): Awake and Alert Pre-procedure Verification/Time Out Yes - 11:24 Taken: Start Time: 11:25 Pain Control: Lidocaine 4% T opical Solution T Area Debrided (L x W): otal 0.6 (cm) x 0.4 (cm) = 0.24 (cm) Tissue and other material debrided: Non-Viable, Callus, Slough,  Subcutaneous, Slough Level: Skin/Subcutaneous Tissue Debridement Description: Excisional Instrument: Curette Bleeding: Minimum Hemostasis Achieved: Pressure End Time: 11:31 Response to Treatment: Procedure was tolerated well Level of Consciousness (Post- Awake and Alert procedure): Post Debridement Measurements of Total Wound Length: (cm) 0.6 Width: (cm) 0.4 Depth: (cm) 0.3 Volume: (cm) 0.057 Character of Wound/Ulcer Post Debridement: Stable Severity of Tissue Post Debridement: Fat layer exposed [name], [name] ([id number]) [id number] Page 2 of 10 Post Procedure Diagnosis Same as Pre-procedure Notes Procedure scribed for Dr. Heber [place] by Holly Halter, RN Electronic Signature(s) Signed: 12/31/2021 12:13:57 PM By: Holly Shan DO Signed: 12/31/2021 12:38:03 PM By: [name] Entered By: [name] on 12/31/2021 11:32:28 -------------------------------------------------------------------------------- HPI Details Patient Name: Date of Service: [name], [name]. 12/31/2021 11:00 [name] Medical Record Number: [id number] Patient Account Number: [id number] Date of Birth/Sex: Treating RN: [birth year] (71 y.o. [name] Primary Care Provider: [name] Other Clinician: Referring Provider: Treating Provider/Extender: [name] in Treatment: 10 History of Present Illness HPI Description: 05/10/17 patient presents today for initial evaluation and our clinic concerning issues that she has been having with an ulceration on the plantar aspect of the left first metatarsal head. She has been seen [name] podiatrist Dr. Jacqualyn [name] for this since August 2018. Subsequently he has recommended bunion surgery as that seems to be the culprit for why there is pressure and friction occurring at the site causing the callous and subsequently the wound. 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 With that being said I do have notes from several of his visits one of which does include an x-ray documentation stating that she had [name] negative x-ray. There was obviously the bunion the notes I have extended from 11 deformity. With that being said she has not had an MRI fortunately this wound does not probe to bone.  The notes I have extended from February 08, 2017 through April 14, 2017. During that course she was also placed on antibiotics to help with an infection two weeks ago and this was doxycycline. Fortunately that seems to have completely resolved any infection issues that she may have had. Unfortunately this is an ulcer that she has actually been dealing with since April 2018. She just initially was trying to treat it and care for it on her own. Her most recent hemoglobin A1c was 5.8, she is [name] non-smoker, she does have [name] right first toe amputation and [name] left third toe amputation. Fortunately she's not having any discomfort at the site. 05/17/17 on evaluation today patient's ulcer on the plantar aspect of the first metatarsal region appears to be close at this point. There does not appear to be any evidence of ulcer opening. This is on initial inspection. She has not really had any discomfort but honestly she really was not experiencing [name] lot of pain even before. There is no evidence of infection or fluctuation under the callous which is present at this point. ADMISSION 11/12/2019 This is [name] 71 year old woman that we previously had in this clinic in 2017 for two visits with [name] wound I think roughly in the same area. At that point she had [name] bunion in the left. She has apparently had surgery on this since then. Looking through care everywhere we can see that she has had follow-up with podiatry from 06/12/2019 through 10/28/2019 for [name] wound on the plantar aspect of her left first MTP. She states when this  started she was having severe knee pain on the right which forced her to alter her gait. She has recently received doxycycline which she is finished and Bactroban. She is back to using Medihoney on the wound. She is offloading with the Pegasys shoe. Past medical history includes type 2 diabetes with [name] recent hemoglobin A1c of 6.8, severe type II diabetic neuropathy, history of [name] CVA, stage III chronic renal failure hypertension, depression, of bilateral previous bunion surgery, right first toe amputation and [name] partial left third toe amputation for osteomyelitis. The patient states that she recently had work to do in her mother's house in [place] and so she was on her feet quite [name] lot which delayed the healing. ABI in our clinic was 1.1 on the left 8/31; plantar aspect of her left first metatarsal head. Using silver collagen. This is probably [name] surgical wound 9/14; this is [name] patient who had [name] wound on the plantar aspect of her left first metatarsal head in the setting of [name] significant bunion deformity. We use cervical collagen and [name] offloading shoe. She has significant diabetic neuropathy probably some degree of [name] Charcot deformity in her feet. This is closed over today. Readmission 03/30/2021 Ms. Jolanta Cabeza is [name] 71 year old female with [name] past medical history of insulin-dependent controlled type 2 diabetes s/p right great toe amputation, right knee osteoarthritis and hypertension that presents to the clinic for [name] second right toe wound. She states she was getting out of the bathtub when her foot got caught the tub and the skin to her second toe was split open. She noticed this after the bath when she saw blood on the floor. She has neuropathy in her feet. She visited the ED following the event on 03/26/2021. They obtained x-rays that showed [name] fracture to the fifth toe but no fracture to the second toe. She was started on doxycycline. She currently denies signs of infection.  She has been keeping the  area covered with [name] Band-Aid. Readmission 10/21/2021 [name] is [name] 71 year old female with [name] past medical history of insulin dependent controlled type 2 diabetes complicated by peripheral neuropathy that presents to the clinic for [name] left met head wound. She has been following with Dr. Earleen Newport, podiatry for over [name] year for this issue. She has tried collagen, mupirocin ointment and Medihoney. She is at high fall risk and cannot tolerate [name] total contact cast. She has orthotics with specialized inserts for her current issue. She currently denies signs of infection. 8/10; patient presents for follow-up. Unfortunately Regranex was unaffordable at $1200. She has been using Medihoney to the wound bed. She reports aggressively offloading the area. She has offloading inserts. She denies signs of infection. 8/22; patient presents for follow-up. She has been using Medihoney to the wound bed with improvement in wound healing. She has no issues or complaints JUDIA, ARNOTT [name] ([id number]) [phone].pdf Page 3 of 10 today. She will be out of town starting next week for the next 2 weeks. 9/11; patient presents for follow-up. She has been using Medihoney to the wound bed. She went on vacation and was last seen 3 weeks ago. She states she had tried to aggressively offload the wound bed. 9/26; patient presents for follow-up. She has been using Medihoney to the wound bed. She has no issues or complaints today. 10/13; patient presents for follow-up. She has been using Medihoney to the wound bed. She has no issues or complaints today. We rediscussed the possibility of [name] total contact cast but she does not feel comfortable as she is [name] high fall risk and lives alone. Electronic Signature(s) Signed: 12/31/2021 12:13:57 PM By: Holly Shan DO Entered By: [name] on 12/31/2021 11:40:28 -------------------------------------------------------------------------------- Physical Exam  Details Patient Name: Date of Service: [name], [name] RA H [name]. 12/31/2021 11:00 [name] Medical Record Number: [id number] Patient Account Number: [id number] Date of Birth/Sex: Treating RN: 18-Feb-1951 (71 y.o. [name] Primary Care Provider: [name] Other Clinician: Referring Provider: Treating Provider/Extender: [name] Weeks in Treatment: 10 Constitutional respirations regular, non-labored and within target range for patient.. Cardiovascular 2+ dorsalis pedis/posterior tibialis pulses. Psychiatric pleasant and cooperative. Notes Left foot: T the left first metatarsal there is an open wound with granulation tissue, Nonviable tissue and callus. No increased warmth, erythema or purulent o drainage. Electronic Signature(s) Signed: 12/31/2021 12:13:57 PM By: Holly Shan DO Entered By: [name] on 12/31/2021 11:40:52 -------------------------------------------------------------------------------- Physician Orders Details Patient Name: Date of Service: [name], [name]. 12/31/2021 11:00 [name] Medical Record Number: [id number] Patient Account Number: [id number] Date of Birth/Sex: Treating RN: May 08, 1950 (71 y.o. [name] Primary Care Provider: [name] Other Clinician: Referring Provider: Treating Provider/Extender: [name] in Treatment: 10 Verbal / Phone Orders: No Diagnosis Coding ICD-10 Coding Code Description [phone] Non-pressure chronic ulcer of other part of left foot with fat layer exposed E11.621 Type 2 diabetes mellitus with foot ulcer E11.42 Type 2 diabetes mellitus with diabetic polyneuropathy [place], [place] [name] ([id number]) [id number] Page 4 of 10 Follow-up Appointments ppointment in 1 week. - Dr. Heber Ponshewaing and Leveda Anna, RN (Room 7) Return [name] Other: - Prism=Supplies Anesthetic (In clinic) Topical Lidocaine 5% applied to wound bed (In clinic) Topical  Lidocaine 4% applied to wound bed Cellular or Tissue Based Products Cellular or Tissue Based Product Type: - IVR for Grafix IVR for [employer]=$225 Cop[place] Bathing/ Shower/ Hygiene May shower and wash wound with  soap and water. Edema Control - Lymphedema / SCD / Other Avoid standing for long periods of time. Off-Loading Other: - Diabetic Shoes Relieve pressure/time standing of feet Additional Orders / Instructions Follow Nutritious Diet - Monitor/Control Blood Sugars Non Wound Condition Other Non Wound Condition Orders/Instructions: - Apply corn pad or gauze between great toe and 2nd toe. Wound Treatment Wound #4 - Metatarsal head first Wound Laterality: Plantar, Left Cleanser: Soap and Water 1 x Per Day/30 Days Discharge Instructions: May shower and wash wound with dial antibacterial soap and water prior to dressing change. Peri-Wound Care: Skin Prep (Generic) 1 x Per Day/30 Days Discharge Instructions: Use skin prep as directed Prim Dressing: MediHoney Gel, tube 1.5 (oz) 1 x Per Day/30 Days ary Discharge Instructions: Apply until Regranex obtained Prim Dressing: Optifoam Non-Adhesive Dressing, 4x4 in 1 x Per Day/30 Days ary Discharge Instructions: Apply as donut around wound Secondary Dressing: ALLEVYN Gentle Border, 3x3 (in/in) (Generic) 1 x Per Day/30 Days Discharge Instructions: Apply over primary dressing as directed. Electronic Signature(s) Signed: 12/31/2021 12:13:57 PM By: Holly Shan DO Entered By: [name] on 12/31/2021 11:41:00 -------------------------------------------------------------------------------- Problem List Details Patient Name: Date of Service: [name], [name]. 12/31/2021 11:00 [name] Medical Record Number: [id number] Patient Account Number: [id number] Date of Birth/Sex: Treating RN: Jun 21, 1950 (71 y.o. [name] Primary Care Provider: [name] Other Clinician: Referring Provider: Treating Provider/Extender: [name] Weeks in Treatment: 10 Active Problems ICD-10 Encounter Code Description Active Date MDM Diagnosis L97.522 Non-pressure chronic ulcer of other part of left foot with fat layer exposed 10/21/2021 No Yes [place], [place] [name] ([id number]) [phone].pdf Page 5 of 10 E11.621 Type 2 diabetes mellitus with foot ulcer 10/21/2021 No Yes E11.42 Type 2 diabetes mellitus with diabetic polyneuropathy 10/21/2021 No Yes Inactive Problems Resolved Problems Electronic Signature(s) Signed: 12/31/2021 12:13:57 PM By: Holly Shan DO Previous Signature: 12/31/2021 11:27:04 AM Version By: [name] Entered By: [name] on 12/31/2021 11:39:14 -------------------------------------------------------------------------------- Progress Note Details Patient Name: Date of Service: [name], [name]. 12/31/2021 11:00 [name] Medical Record Number: [id number] Patient Account Number: [id number] Date of Birth/Sex: Treating RN: 29-Feb-1951 (71 y.o. [name] Primary Care Provider: [name] Other Clinician: Referring Provider: Treating Provider/Extender: [name] Weeks in Treatment: 10 Subjective Chief Complaint Information obtained from Patient Left plantar ulcer 11/12/2019; patient is here referred by podiatry for review of [name] wound on the left first plantar metatarsal head 03/30/2021; right second toe wound s/p Trauma 10/21/2021; First left metatarsal head wound History of Present Illness (HPI) 05/10/17 patient presents today for initial evaluation and our clinic concerning issues that she has been having with an ulceration on the plantar aspect of the left first metatarsal head. She has been seen [name] podiatrist Dr. Jacqualyn [name] for this since August 2018. Subsequently he has recommended bunion surgery as that seems to be the culprit for why there is pressure and friction occurring at the site causing the callous and subsequently the  wound. 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 With that being said I do have notes from several of his visits one of which does include an x-ray documentation stating that she had [name] negative x-ray. There was obviously the bunion the notes I have extended from 11 deformity. With that being said she has not had an MRI fortunately this  wound does not probe to bone. The notes I have extended from February 08, 2017 through April 14, 2017. During that course she was also placed on antibiotics to help with an infection two weeks ago and this was doxycycline. Fortunately that seems to have completely resolved any infection issues that she may have had. Unfortunately this is an ulcer that she has actually been dealing with since April 2018. She just initially was trying to treat it and care for it on her own. Her most recent hemoglobin A1c was 5.8, she is [name] non-smoker, she does have [name] right first toe amputation and [name] left third toe amputation. Fortunately she's not having any discomfort at the site. 05/17/17 on evaluation today patient's ulcer on the plantar aspect of the first metatarsal region appears to be close at this point. There does not appear to be any evidence of ulcer opening. This is on initial inspection. She has not really had any discomfort but honestly she really was not experiencing [name] lot of pain even before. There is no evidence of infection or fluctuation under the callous which is present at this point. ADMISSION 11/12/2019 This is [name] 71 year old woman that we previously had in this clinic in 2017 for two visits with [name] wound I think roughly in the same area. At that point she had [name] bunion in the left. She has apparently had surgery on this since then. Looking through care everywhere we can see that she has had follow-up with podiatry from 06/12/2019  through 10/28/2019 for [name] wound on the plantar aspect of her left first MTP. She states when this started she was having severe knee pain on the right which forced her to alter her gait. She has recently received doxycycline which she is finished and Bactroban. She is back to using Medihoney on the wound. She is offloading with the Pegasys shoe. Past medical history includes type 2 diabetes with [name] recent hemoglobin A1c of 6.8, severe type II diabetic neuropathy, history of [name] CVA, stage III chronic renal failure hypertension, depression, of bilateral previous bunion surgery, right first toe amputation and [name] partial left third toe amputation for osteomyelitis. The patient states that she recently had work to do in her mother's house in [place] and so she was on her feet quite [name] lot which delayed the healing. ABI in our clinic was 1.1 on the left 8/31; plantar aspect of her left first metatarsal head. Using silver collagen. This is probably [name] surgical wound 9/14; this is [name] patient who had [name] wound on the plantar aspect of her left first metatarsal head in the setting of [name] significant bunion deformity. We use cervical collagen and [name] offloading shoe. She has significant diabetic neuropathy probably some degree of [name] Charcot deformity in her feet. This is closed over today. Readmission 03/30/2021 [name] is [name] 71 year old female with [name] past medical history of insulin-dependent controlled type 2 diabetes s/p right great toe amputation, right knee osteoarthritis and hypertension that presents to the clinic for [name] second right toe wound. She states she was getting out of the bathtub when her foot got caught Holly, BOLTZ [name] ([id number]) [id number] Page 6 of 10 the tub and the skin to her second toe was split open. She noticed this after the bath when she saw blood on the floor. She has neuropathy in her feet. She visited the ED following the event on 03/26/2021. They obtained  x-rays that showed [name] fracture to the fifth toe but no  fracture to the second toe. She was started on doxycycline. She currently denies signs of infection. She has been keeping the area covered with [name] Band-Aid. Readmission 10/21/2021 Ms. Steele Figg is [name] 71 year old female with [name] past medical history of insulin dependent controlled type 2 diabetes complicated by peripheral neuropathy that presents to the clinic for [name] left met head wound. She has been following with Dr. Earleen Newport, podiatry for over [name] year for this issue. She has tried collagen, mupirocin ointment and Medihoney. She is at high fall risk and cannot tolerate [name] total contact cast. She has orthotics with specialized inserts for her current issue. She currently denies signs of infection. 8/10; patient presents for follow-up. Unfortunately Regranex was unaffordable at $1200. She has been using Medihoney to the wound bed. She reports aggressively offloading the area. She has offloading inserts. She denies signs of infection. 8/22; patient presents for follow-up. She has been using Medihoney to the wound bed with improvement in wound healing. She has no issues or complaints today. She will be out of town starting next week for the next 2 weeks. 9/11; patient presents for follow-up. She has been using Medihoney to the wound bed. She went on vacation and was last seen 3 weeks ago. She states she had tried to aggressively offload the wound bed. 9/26; patient presents for follow-up. She has been using Medihoney to the wound bed. She has no issues or complaints today. 10/13; patient presents for follow-up. She has been using Medihoney to the wound bed. She has no issues or complaints today. We rediscussed the possibility of [name] total contact cast but she does not feel comfortable as she is [name] high fall risk and lives alone. Patient History Information obtained from Patient. Family History Cancer - Father, Hypertension - Mother,Father,Siblings, Stroke -  Mother,Father, No family history of Diabetes, Heart Disease, Hereditary Spherocytosis, Kidney Disease, Lung Disease, Seizures, Thyroid Problems, Tuberculosis. Social History Never smoker, Marital Status - Single, Alcohol Use - Never, Drug Use - No History, Caffeine Use - Rarely. Medical History Eyes Patient has history of Cataracts Denies history of Glaucoma, Optic Neuritis Ear/Nose/Mouth/Throat Denies history of Chronic sinus problems/congestion, Middle ear problems Hematologic/Lymphatic Denies history of Anemia, Hemophilia, Human Immunodeficiency Virus, Lymphedema, Sickle Cell Disease Respiratory Patient has history of Sleep Apnea Denies history of Aspiration, Asthma, Chronic Obstructive Pulmonary Disease (COPD), Pneumothorax, Tuberculosis Cardiovascular Patient has history of Hypertension Denies history of Angina, Arrhythmia, Congestive Heart Failure, Coronary Artery Disease, Deep Vein Thrombosis, Hypotension, Myocardial Infarction, Peripheral Arterial Disease, Peripheral Venous Disease, Phlebitis, Vasculitis Gastrointestinal Denies history of Cirrhosis , Colitis, Crohnoos, Hepatitis [name], Hepatitis B, Hepatitis C Endocrine Patient has history of Type II Diabetes Denies history of Type I Diabetes Genitourinary Denies history of End Stage Renal Disease Immunological Denies history of Lupus Erythematosus, Raynaudoos, Scleroderma Integumentary (Skin) Denies history of History of Burn Musculoskeletal Patient has history of Osteoarthritis, Osteomyelitis - Hx in 2007 (left 3rd toe) Denies history of Gout, Rheumatoid Arthritis Neurologic Patient has history of Neuropathy Denies history of Dementia, Quadriplegia, Paraplegia, Seizure Disorder Oncologic Patient has history of Received Radiation - 03/2020 Denies history of Received Chemotherapy Psychiatric Patient has history of Confinement Anxiety Denies history of Anorexia/bulimia Hospitalization/Surgery History - Amputation of  right great toe. - Tendon repair. - Hysterectomy. - Osteomyelitis left 3rd toe. - cervical fusion with cage. - left breast lumpectomy. Medical [name] Surgical History Notes nd Constitutional Symptoms (General Health) morbid obesity Cardiovascular Takotsubo cardiomyopathy 2012, hyperlipidemia, Hx CVA, Tachycardia Genitourinary CKD stage 3 Musculoskeletal left  leg weakness Neurologic CVA Oncologic hx endometrial cancer, left breast CA Psychiatric depression KEIVA, DINA [name] ([id number]) [id number] Page 7 of 10 Objective Constitutional respirations regular, non-labored and within target range for patient.. Vitals Time Taken: 11:01 AM, Temperature: 98 F, Pulse: 85 bpm, Respiratory Rate: 18 breaths/min, Blood Pressure: 132/79 mmHg. Cardiovascular 2+ dorsalis pedis/posterior tibialis pulses. Psychiatric pleasant and cooperative. General Notes: Left foot: T the left first metatarsal there is an open wound with granulation tissue, Nonviable tissue and callus. No increased warmth, o erythema or purulent drainage. Integumentary (Hair, Skin) Wound #4 status is Open. Original cause of wound was Gradually Appeared. The date acquired was: 10/02/2020. The wound has been in treatment 10 weeks. The wound is located on the Left,Plantar Metatarsal head first. The wound measures 0.6cm length x 0.4cm width x 0.3cm depth; 0.188cm^2 area and 0.057cm^3 volume. There is Fat Layer (Subcutaneous Tissue) exposed. There is undermining starting at 12:00 and ending at 12:00 with [name] maximum distance of 0.3cm. There is [name] medium amount of serosanguineous drainage noted. The wound margin is distinct with the outline attached to the wound base. There is large (67- 100%) red granulation within the wound bed. There is [name] small (1-33%) amount of necrotic tissue within the wound bed including Adherent Slough. The periwound skin appearance exhibited: Callus. The periwound skin appearance did not  exhibit: Crepitus, Excoriation, Induration, Rash, Scarring, Dry/Scaly, Maceration, Atrophie Blanche, Cyanosis, Ecchymosis, Hemosiderin Staining, Mottled, Pallor, Rubor, Erythema. Periwound temperature was noted as No Abnormality. Assessment Active Problems ICD-10 Non-pressure chronic ulcer of other part of left foot with fat layer exposed Type 2 diabetes mellitus with foot ulcer Type 2 diabetes mellitus with diabetic polyneuropathy Patient's wound is stable. I debrided nonviable tissue. Patient is [name] high fall risk and is not [name] candidate for the total contact cast. She would benefit from [name] skin substitute. We have run her before for organogenesis but cost was too high for the patient. We will try Grafix. She can continue Medihoney for now. Continue aggressive offloading. Procedures Wound #4 Pre-procedure diagnosis of Wound #4 is [name] Diabetic Wound/Ulcer of the Lower Extremity located on the Left,Plantar Metatarsal head first .Severity of Tissue Pre Debridement is: Fat layer exposed. There was [name] Excisional Skin/Subcutaneous Tissue Debridement with [name] total area of 0.24 sq cm performed by Holly Shan, DO. With the following instrument(s): Curette to remove Non-Viable tissue/material. Material removed includes Callus, Subcutaneous Tissue, and Slough after achieving pain control using Lidocaine 4% Topical Solution. No specimens were taken. [name] time out was conducted at 11:24, prior to the start of the procedure. [name] Minimum amount of bleeding was controlled with Pressure. The procedure was tolerated well. Post Debridement Measurements: 0.6cm length x 0.4cm width x 0.3cm depth; 0.057cm^3 volume. Character of Wound/Ulcer Post Debridement is stable. Severity of Tissue Post Debridement is: Fat layer exposed. Post procedure Diagnosis Wound #4: Same as Pre-Procedure General Notes: Procedure scribed for Dr. Heber [place] by Holly Halter, RN. Plan Follow-up Appointments: Return Appointment in 1 week. - Dr. Heber [place]  and Leveda Anna, RN (Room 7) Other: - Prism=Supplies Anesthetic: (In clinic) Topical Lidocaine 5% applied to wound bed (In clinic) Topical Lidocaine 4% applied to wound bed Cellular or Tissue Based Products: Cellular or Tissue Based Product Type: - IVR for Grafix IVR for [employer]=$225 Copay [place], [place] [name] ([id number]) [id number] Page 8 of 10 Bathing/ Shower/ Hygiene: May shower and wash wound with soap and water. Edema Control - Lymphedema / SCD / Other: Avoid standing for long periods of time. Off-Loading: Other: -  Diabetic Shoes Relieve pressure/time standing of feet Additional Orders / Instructions: Follow Nutritious Diet - Monitor/Control Blood Sugars Non Wound Condition: Other Non Wound Condition Orders/Instructions: - Apply corn pad or gauze between great toe and 2nd toe. WOUND #4: - Metatarsal head first Wound Laterality: Plantar, Left Cleanser: Soap and Water 1 x Per Day/30 Days Discharge Instructions: May shower and wash wound with dial antibacterial soap and water prior to dressing change. Peri-Wound Care: Skin Prep (Generic) 1 x Per Day/30 Days Discharge Instructions: Use skin prep as directed Prim Dressing: MediHoney Gel, tube 1.5 (oz) 1 x Per Day/30 Days ary Discharge Instructions: Apply until Regranex obtained Prim Dressing: Optifoam Non-Adhesive Dressing, 4x4 in 1 x Per Day/30 Days ary Discharge Instructions: Apply as donut around wound Secondary Dressing: ALLEVYN Gentle Border, 3x3 (in/in) (Generic) 1 x Per Day/30 Days Discharge Instructions: Apply over primary dressing as directed. 1. In office sharp debridement 2. Medihoney 3. Aggressive offloadingoodonut foam pad 4. Run IVR for [employer]) Signed: 12/31/2021 12:13:57 PM By: Holly Shan DO Entered By: [name] on 12/31/2021 11:42:24 -------------------------------------------------------------------------------- HxROS Details Patient Name: Date of  Service: [name], [name]. 12/31/2021 11:00 [name] Medical Record Number: [id number] Patient Account Number: [id number] Date of Birth/Sex: Treating RN: 16-Sep-1950 (71 y.o. [name] Primary Care Provider: [name] Other Clinician: Referring Provider: Treating Provider/Extender: [name] in Treatment: 10 Information Obtained From Patient Constitutional Symptoms (General Health) Medical History: Past Medical History Notes: morbid obesity Eyes Medical History: Positive for: Cataracts Negative for: Glaucoma; Optic Neuritis Ear/Nose/Mouth/Throat Medical History: Negative for: Chronic sinus problems/congestion; Middle ear problems Hematologic/Lymphatic Medical History: Negative for: Anemia; Hemophilia; Human Immunodeficiency Virus; Lymphedema; Sickle Cell Disease Respiratory Medical History: Positive for: Sleep Apnea Negative for: Aspiration; Asthma; Chronic Obstructive Pulmonary Disease (COPD); Pneumothorax; Tuberculosis [place], [place] [name] ([id number]) [id number] Page 9 of 10 Cardiovascular Medical History: Positive for: Hypertension Negative for: Angina; Arrhythmia; Congestive Heart Failure; Coronary Artery Disease; Deep Vein Thrombosis; Hypotension; Myocardial Infarction; Peripheral Arterial Disease; Peripheral Venous Disease; Phlebitis; Vasculitis Past Medical History Notes: Takotsubo cardiomyopathy 2012, hyperlipidemia, Hx CVA, Tachycardia Gastrointestinal Medical History: Negative for: Cirrhosis ; Colitis; Crohns; Hepatitis [name]; Hepatitis B; Hepatitis C Endocrine Medical History: Positive for: Type II Diabetes Negative for: Type I Diabetes Time with diabetes: over 20 years Treated with: Insulin, Oral agents Blood sugar tested every day: Yes Tested : daily Genitourinary Medical History: Negative for: End Stage Renal Disease Past Medical History Notes: CKD stage 3 Immunological Medical  History: Negative for: Lupus Erythematosus; Raynauds; Scleroderma Integumentary (Skin) Medical History: Negative for: History of Burn Musculoskeletal Medical History: Positive for: Osteoarthritis; Osteomyelitis - Hx in 2007 (left 3rd toe) Negative for: Gout; Rheumatoid Arthritis Past Medical History Notes: left leg weakness Neurologic Medical History: Positive for: Neuropathy Negative for: Dementia; Quadriplegia; Paraplegia; Seizure Disorder Past Medical History Notes: CVA Oncologic Medical History: Positive for: Received Radiation - 03/2020 Negative for: Received Chemotherapy Past Medical History Notes: hx endometrial cancer, left breast CA Psychiatric Medical History: Positive for: Confinement Anxiety Negative for: Anorexia/bulimia Past Medical History Notes: depression HBO Extended History Items Eyes: Cataracts Immunizations Pneumococcal VaccineCNIYAH, SPROULL [name] ([id number]) [id number] Page 10 of 10 Received Pneumococcal Vaccination: Yes Received Pneumococcal Vaccination On or After 60th Birthday: Yes Implantable Devices None Hospitalization / Surgery History Type of Hospitalization/Surgery Amputation of right great toe Tendon repair Hysterectomy Osteomyelitis left 3rd toe cervical fusion with cage left breast lumpectomy Family and Social History Cancer: Yes - Father; Diabetes: No; Heart Disease: No;  Hereditary Spherocytosis: No; Hypertension: Yes - Mother,Father,Siblings; Kidney Disease: No; Lung Disease: No; Seizures: No; Stroke: Yes - Mother,Father; Thyroid Problems: No; Tuberculosis: No; Never smoker; Marital Status - Single; Alcohol Use: Never; Drug Use: No History; Caffeine Use: Rarely; Financial Concerns: No; Food, Clothing or Shelter Needs: No; Support System Lacking: No; Transportation Concerns: No Electronic Signature(s) Signed: 12/31/2021 12:13:57 PM By: Holly Shan DO Signed: 12/31/2021 12:38:03 PM By: [name] Entered By: [name] on 12/31/2021 11:40:34 -------------------------------------------------------------------------------- SuperBill Details Patient Name: Date of Service: [name], [name]. 12/31/2021 Medical Record Number: [id number] Patient Account Number: [id number] Date of Birth/Sex: Treating RN: [birth year] (71 y.o. [name] Primary Care Provider: [name] Other Clinician: Referring Provider: Treating Provider/Extender: [name] Weeks in Treatment: 10 Diagnosis Coding ICD-10 Codes Code Description [phone] Non-pressure chronic ulcer of other part of left foot with fat layer exposed E11.621 Type 2 diabetes mellitus with foot ulcer E11.42 Type 2 diabetes mellitus with diabetic polyneuropathy Facility Procedures : CPT4 Code: [id number] Description: 79536 - DEB SUBQ TISSUE 20 SQ CM/< ICD-10 Diagnosis Description L97.522 Non-pressure chronic ulcer of other part of left foot with fat layer exposed Modifier: Quantity: 1 Physician Procedures : CPT4 Code Description Modifier [id number] - WC PHYS SUBQ TISS 20 SQ CM ICD-10 Diagnosis Description L97.522 Non-pressure chronic ulcer of other part of left foot with fat layer exposed Quantity: 1 Electronic Signature(s) Signed: 12/31/2021 12:13:57 PM By: Holly Shan DO Entered By: [name] on 12/31/2021 11:42:31

## 2021-12-31 NOTE — Progress Notes (Signed)
[name] ([id number]) [id number] Page 1 of 8 Visit Report for 12/31/2021 Arrival Information Details Patient Name: Date of Service: [name]. 12/31/2021 11:00 [name] Medical Record Number: [id number] Patient Account Number: [id number] Date of Birth/Sex: Treating RN: Jul 18, 1950 (71 y.o. [name] Primary Care [name]: [name] Other Clinician: Referring [name]: Treating [name]/Extender: [name] in Treatment: 10 Visit Information History Since Last Visit Added or deleted any medications: No Patient Arrived: [name] Any new allergies or adverse reactions: No Arrival Time: 11:01 Had [name] fall or experienced change in No Accompanied By: self activities of daily living that may affect Transfer Assistance: None risk of falls: Patient Identification Verified: Yes Signs or symptoms of abuse/neglect since last visito No Secondary Verification Process Completed: Yes Hospitalized since last visit: No Patient Requires Transmission-Based Precautions: No Implantable device outside of the clinic excluding No Patient Has Alerts: Yes cellular tissue based products placed in the center Patient Alerts: ABI's R=1.07 L=1.02 since last visit: Has Dressing in Place as Prescribed: Yes Pain Present Now: No Electronic Signature(s) Signed: 12/31/2021 11:48:14 AM By: Erenest Blank Entered By: Erenest Blank on 12/31/2021 11:02:46 -------------------------------------------------------------------------------- Encounter Discharge Information Details Patient Name: Date of Service: [name], Holly RA H [name]. 12/31/2021 11:00 [name] Medical Record Number: [id number] Patient Account Number: [id number] Date of Birth/Sex: Treating RN: [birth year] (71 y.o. [name] Primary Care [name]: [name] Other Clinician: Referring [name]: Treating [name]/Extender: [name] in Treatment:  10 Encounter Discharge Information Items Post Procedure Vitals Discharge Condition: Stable Temperature (F): 98 Ambulatory Status: Cane Pulse (bpm): 85 Discharge Destination: Home Respiratory Rate (breaths/min): 18 Transportation: Private Auto Blood Pressure (mmHg): 132/79 Schedule Follow-up Appointment: Yes Clinical Summary of Care: Provided on 12/31/2021 Form Type Recipient Paper Patient Patient Electronic Signature(s) Signed: 12/31/2021 12:38:03 PM By: Lorrin Jackson Entered By: Lorrin Jackson on 12/31/2021 11:37:07 Bachar, [name] ([id number]) [id number] Page 2 of 8 -------------------------------------------------------------------------------- Lower Extremity Assessment Details Patient Name: Date of Service: [name]. 12/31/2021 11:00 [name] Medical Record Number: [id number] Patient Account Number: [id number] Date of Birth/Sex: Treating RN: 1950-12-24 (71 y.o. [name] Primary Care [name]: [name] Other Clinician: Referring Lashonna Rieke: Treating Kadance Mccuistion/Extender: [name] Weeks in Treatment: 10 Edema Assessment Assessed: [Left: No] [Right: No] Edema: [Left: N] [Right: o] Calf Left: Right: Point of Measurement: 34 cm From Medial Instep 45.5 cm Ankle Left: Right: Point of Measurement: 9 cm From Medial Instep 28.3 cm Electronic Signature(s) Signed: 12/31/2021 11:48:14 AM By: Erenest Blank Signed: 12/31/2021 12:38:03 PM By: Lorrin Jackson Entered By: Erenest Blank on 12/31/2021 11:09:35 -------------------------------------------------------------------------------- Multi Wound Chart Details Patient Name: Date of Service: [name], Holly RA H [name]. 12/31/2021 11:00 [name] Medical Record Number: [id number] Patient Account Number: [id number] Date of Birth/Sex: Treating RN: 02-Mar-1951 (71 y.o. [name] Primary Care Laurynn Mccorvey: [name] Other Clinician: Referring [name]: Treating  Caeli Linehan/Extender: [name] Weeks in Treatment: 10 Vital Signs Height(in): Pulse(bpm): 78 Weight(lbs): Blood Pressure(mmHg): 132/79 Body Mass Index(BMI): Temperature(F): 98 Respiratory Rate(breaths/min): 18 [4:Photos:] [N/[name]:N/[name]] Left, Plantar Metatarsal head first N/[name] N/[name] Wound Location: Gradually Appeared N/[name] N/[name] Wounding Event: Diabetic Wound/Ulcer of the Lower N/[name] N/[name] Primary Etiology: Extremity Cataracts, Sleep Apnea, Hypertension,N/[name] N/[name] Comorbid HistoryDANIQUA, [name] ([id number]) [id number] Page 3 of 8 Type II Diabetes, Osteoarthritis, Osteomyelitis, Neuropathy, Received Radiation, Confinement Anxiety 10/02/2020 N/[name] N/[name] Date Acquired: 10 N/[name] N/[name] Weeks of Treatment: Open N/[name] N/[name] Wound Status: No  N/[name] N/[name] Wound Recurrence: 0.6x0.4x0.3 N/[name] N/[name] Measurements L x W x D (cm) 0.188 N/[name] N/[name] [name] (cm) : rea 0.057 N/[name] N/[name] Volume (cm) : 53.90% N/[name] N/[name] % Reduction in [name] rea: 53.70% N/[name] N/[name] % Reduction in Volume: 12 Starting Position 1 (o'clock): 12 Ending Position 1 (o'clock): 0.3 Maximum Distance 1 (cm): Yes N/[name] N/[name] Undermining: Grade 1 N/[name] N/[name] Classification: Medium N/[name] N/[name] Exudate [name] mount: Serosanguineous N/[name] N/[name] Exudate Type: red, brown N/[name] N/[name] Exudate Color: Distinct, outline attached N/[name] N/[name] Wound Margin: Large (67-100%) N/[name] N/[name] Granulation [name] mount: Red N/[name] N/[name] Granulation Quality: Small (1-33%) N/[name] N/[name] Necrotic [name] mount: Fat Layer (Subcutaneous Tissue): Yes N/[name] N/[name] Exposed Structures: Fascia: No Tendon: No Muscle: No Joint: No Bone: No Medium (34-66%) N/[name] N/[name] Epithelialization: Debridement - Excisional N/[name] N/[name] Debridement: Pre-procedure Verification/Time Out 11:24 N/[name] N/[name] Taken: Lidocaine 4% Topical Solution N/[name] N/[name] Pain Control: Callus, Subcutaneous, Slough N/[name] N/[name] Tissue Debrided: Skin/Subcutaneous Tissue N/[name] N/[name] Level: 0.24 N/[name] N/[name] Debridement [name] (sq cm): rea Curette N/[name]  N/[name] Instrument: Minimum N/[name] N/[name] Bleeding: Pressure N/[name] N/[name] Hemostasis [name] chieved: Procedure was tolerated well N/[name] N/[name] Debridement Treatment Response: 0.6x0.4x0.3 N/[name] N/[name] Post Debridement Measurements L x W x D (cm) 0.057 N/[name] N/[name] Post Debridement Volume: (cm) Callus: Yes N/[name] N/[name] Periwound Skin Texture: Excoriation: No Induration: No Crepitus: No Rash: No Scarring: No Maceration: No N/[name] N/[name] Periwound Skin Moisture: Dry/Scaly: No Atrophie Blanche: No N/[name] N/[name] Periwound Skin Color: Cyanosis: No Ecchymosis: No Erythema: No Hemosiderin Staining: No Mottled: No Pallor: No Rubor: No No Abnormality N/[name] N/[name] Temperature: Debridement N/[name] N/[name] Procedures Performed: Treatment Notes Wound #4 (Metatarsal head first) Wound Laterality: Plantar, Left Cleanser Soap and Water Discharge Instruction: May shower and wash wound with dial antibacterial soap and water prior to dressing change. Peri-Wound Care Skin Prep Discharge Instruction: Use skin prep as directed Topical Primary Dressing MediHoney Gel, tube 1.5 (oz) Discharge Instruction: Apply until Regranex obtained Optifoam Non-Adhesive Dressing, 4x4 in Discharge Instruction: Apply as donut around wound JULITZA, RICKLES [name] ([id number]) [phone].pdf Page 4 of 8 Secondary Dressing ALLEVYN Gentle Border, 3x3 (in/in) Discharge Instruction: Apply over primary dressing as directed. Secured With Compression Wrap Compression Stockings Environmental education officer) Signed: 12/31/2021 12:13:57 PM By: [name] DO Signed: 12/31/2021 12:38:03 PM By: Lorrin Jackson Entered By: [name] on 12/31/2021 11:39:21 -------------------------------------------------------------------------------- Multi-Disciplinary Care Plan Details Patient Name: Date of Service: [name], Holly RA H [name]. 12/31/2021 11:00 [name] Medical Record Number: [id number] Patient Account Number: [id number] Date of Birth/Sex: Treating  RN: [birth year] (71 y.o. [name] Primary Care Larya Charpentier: [name] Other Clinician: Referring Maansi Wike: Treating Pasqualino Witherspoon/Extender: [name] in Treatment: 10 Active Inactive Nutrition Nursing Diagnoses: Impaired glucose control: actual or potential Goals: Patient/caregiver verbalizes understanding of need to maintain therapeutic glucose control per primary care physician Date Initiated: 10/21/2021 Target Resolution Date: 01/13/2022 Goal Status: Active Interventions: Assess HgA1c results as ordered upon admission and as needed Provide education on elevated blood sugars and impact on wound healing Notes: Wound/Skin Impairment Nursing Diagnoses: Impaired tissue integrity Goals: Patient/caregiver will verbalize understanding of skin care regimen Date Initiated: 10/21/2021 Target Resolution Date: 01/13/2022 Goal Status: Active Ulcer/skin breakdown will have [name] volume reduction of 30% by week 4 Date Initiated: 10/21/2021 Date Inactivated: 11/29/2021 Target Resolution Date: 11/18/2021 Goal Status: Met Interventions: Assess patient/caregiver ability to obtain necessary supplies Assess patient/caregiver ability to perform ulcer/skin care regimen upon admission and as needed Assess ulceration(s) every visit Provide education on ulcer and skin  care Treatment Activities: Topical wound management initiated : 10/21/2021 KENETRA, HILDENBRAND [name] ([id number]) [id number] Page 5 of 8 Notes: 11/29/21: Wound care regimen continues. Running IVR for skin sub Electronic Signature(s) Signed: 12/31/2021 11:27:39 AM By: Lorrin Jackson Entered By: Lorrin Jackson on 12/31/2021 11:27:39 -------------------------------------------------------------------------------- Pain Assessment Details Patient Name: Date of Service: [name], Holly Dess RA H [name]. 12/31/2021 11:00 [name] Medical Record Number: [id number] Patient Account Number: [id number] Date of Birth/Sex:  Treating RN: 1951-02-15 (71 y.o. [name] Primary Care Lonya Johannesen: [name] Other Clinician: Referring Ilda Laskin: Treating Ilsa Bonello/Extender: [name] Weeks in Treatment: 10 Active Problems Location of Pain Severity and Description of Pain Patient Has Paino No Site Locations Pain Management and Medication Current Pain Management: Electronic Signature(s) Signed: 12/31/2021 11:48:14 AM By: Erenest Blank Signed: 12/31/2021 12:38:03 PM By: Lorrin Jackson Entered By: Erenest Blank on 12/31/2021 11:02:55 -------------------------------------------------------------------------------- Patient/Caregiver Education Details Patient Name: Date of Service: Holly Conroy RA H [name]. 10/13/2023andnbsp11:00 [name] Medical Record Number: [id number] Patient Account Number: [id number] Date of Birth/Gender: Treating RN: 08-29-1950 (71 y.o. [name] Primary Care Physician: [name] Other Clinician: Referring Physician: Treating Physician/Extender: [name] in Treatment: [id number] [place], Neoma Laming [name] ([id number]) [id number] Page 6 of 8 Education Provided To: Patient Education Topics Provided Elevated Blood Sugar/ Impact on Healing: Methods: Explain/Verbal, Printed Responses: State content correctly Wound/Skin Impairment: Methods: Explain/Verbal, Printed Responses: State content correctly Electronic Signature(s) Signed: 12/31/2021 12:38:03 PM By: Lorrin Jackson Entered By: Lorrin Jackson on 12/31/2021 11:27:56 -------------------------------------------------------------------------------- Wound Assessment Details Patient Name: Date of Service: [name], Holly RA H [name]. 12/31/2021 11:00 [name] Medical Record Number: [id number] Patient Account Number: [id number] Date of Birth/Sex: Treating RN: [birth year] (71 y.o. [name] Primary Care Jaleil Renwick: [name] Other Clinician: Referring  Idrissa Beville: Treating Rahel Carlton/Extender: [name] Weeks in Treatment: 10 Wound Status Wound Number: 4 Primary Diabetic Wound/Ulcer of the Lower Extremity Etiology: Wound Location: Left, Plantar Metatarsal head first Wound Open Wounding Event: Gradually Appeared Status: Date Acquired: 10/02/2020 Comorbid Cataracts, Sleep Apnea, Hypertension, Type II Diabetes, Weeks Of Treatment: 10 History: Osteoarthritis, Osteomyelitis, Neuropathy, Received Radiation, Clustered Wound: No Confinement Anxiety Photos Wound Measurements Length: (cm) 0.6 Width: (cm) 0.4 Depth: (cm) 0.3 Area: (cm) 0.188 Volume: (cm) 0.057 % Reduction in Area: 53.9% % Reduction in Volume: 53.7% Epithelialization: Medium (34-66%) Undermining: Yes Starting Position (o'clock): 12 Ending Position (o'clock): 12 Maximum Distance: (cm) 0.3 Wound Description Classification: Grade 1 Wound Margin: Distinct, outline attached Exudate Amount: Medium Exudate Type: Serosanguineous [name], [name] ([id number]) Exudate Color: red, brown Foul Odor After Cleansing: No Slough/Fibrino Yes [phone].pdf Page 7 of 8 Wound Bed Granulation Amount: Large (67-100%) Exposed Structure Granulation Quality: Red Fascia Exposed: No Necrotic Amount: Small (1-33%) Fat Layer (Subcutaneous Tissue) Exposed: Yes Necrotic Quality: Adherent Slough Tendon Exposed: No Muscle Exposed: No Joint Exposed: No Bone Exposed: No Periwound Skin Texture Texture Color No Abnormalities Noted: No No Abnormalities Noted: No Callus: Yes Atrophie Blanche: No Crepitus: No Cyanosis: No Excoriation: No Ecchymosis: No Induration: No Erythema: No Rash: No Hemosiderin Staining: No Scarring: No Mottled: No Pallor: No Moisture Rubor: No No Abnormalities Noted: No Dry / Scaly: No Temperature / Pain Maceration: No Temperature: No Abnormality Treatment Notes Wound #4 (Metatarsal head first) Wound  Laterality: Plantar, Left Cleanser Soap and Water Discharge Instruction: May shower and wash wound with dial antibacterial soap and water prior to dressing change. Peri-Wound Care Skin Prep Discharge Instruction: Use skin prep as directed Topical Primary Dressing  MediHoney Gel, tube 1.5 (oz) Discharge Instruction: Apply until Regranex obtained Optifoam Non-Adhesive Dressing, 4x4 in Discharge Instruction: Apply as donut around wound Secondary Dressing ALLEVYN Gentle Border, 3x3 (in/in) Discharge Instruction: Apply over primary dressing as directed. Secured With Compression Wrap Compression Stockings Environmental education officer) Signed: 12/31/2021 12:38:03 PM By: Lorrin Jackson Entered By: Lorrin Jackson on 12/31/2021 11:30:57 -------------------------------------------------------------------------------- Vitals Details Patient Name: Date of Service: [name], Holly RA H [name]. 12/31/2021 11:00 [name] Medical Record Number: [id number] Patient Account Number: [id number] Date of Birth/Sex: Treating RN: Nov 01, 1950 (71 y.o. [name] Primary Care Capers Hagmann: [name] Other Clinician: CARNELL, [name] ([id number]) [id number] Page 8 of 8 Referring Giovany Cosby: Treating Dalayza Zambrana/Extender: [name] Weeks in Treatment: 10 Vital Signs Time Taken: 11:01 Temperature (F): 98 Pulse (bpm): 85 Respiratory Rate (breaths/min): 18 Blood Pressure (mmHg): 132/79 Reference Range: 80 - 120 mg / dl Electronic Signature(s) Signed: 12/31/2021 11:48:14 AM By: Erenest Blank Entered By: Erenest Blank on 12/31/2021 11:02:35

## 2022-01-04 DIAGNOSIS — I10 Essential (primary) hypertension: Secondary | ICD-10-CM | POA: Diagnosis not present

## 2022-01-04 DIAGNOSIS — F3341 Major depressive disorder, recurrent, in partial remission: Secondary | ICD-10-CM | POA: Diagnosis not present

## 2022-01-04 DIAGNOSIS — E785 Hyperlipidemia, unspecified: Secondary | ICD-10-CM | POA: Diagnosis not present

## 2022-01-04 DIAGNOSIS — E1121 Type 2 diabetes mellitus with diabetic nephropathy: Secondary | ICD-10-CM | POA: Diagnosis not present

## 2022-01-04 DIAGNOSIS — N183 Chronic kidney disease, stage 3 unspecified: Secondary | ICD-10-CM | POA: Diagnosis not present

## 2022-01-06 ENCOUNTER — Ambulatory Visit (HOSPITAL_BASED_OUTPATIENT_CLINIC_OR_DEPARTMENT_OTHER): Payer: PPO | Admitting: Internal Medicine

## 2022-01-12 DIAGNOSIS — K5730 Diverticulosis of large intestine without perforation or abscess without bleeding: Secondary | ICD-10-CM | POA: Diagnosis not present

## 2022-01-12 DIAGNOSIS — Z1211 Encounter for screening for malignant neoplasm of colon: Secondary | ICD-10-CM | POA: Diagnosis not present

## 2022-01-13 ENCOUNTER — Encounter (HOSPITAL_BASED_OUTPATIENT_CLINIC_OR_DEPARTMENT_OTHER): Payer: PPO | Admitting: Internal Medicine

## 2022-01-13 DIAGNOSIS — L97522 Non-pressure chronic ulcer of other part of left foot with fat layer exposed: Secondary | ICD-10-CM

## 2022-01-13 DIAGNOSIS — E1142 Type 2 diabetes mellitus with diabetic polyneuropathy: Secondary | ICD-10-CM

## 2022-01-13 DIAGNOSIS — E11621 Type 2 diabetes mellitus with foot ulcer: Secondary | ICD-10-CM | POA: Diagnosis not present

## 2022-01-13 NOTE — Progress Notes (Signed)
Holly, [name] [name] ([id number]) [id number] Page 1 of 9 Visit Report for 01/13/2022 Chief Complaint Document Details Patient Name: Date of Service: Holly [name] 01/13/2022 2:30 PM Medical Record Number: [id number] Patient Account Number: [id number] Date of Birth/Sex: Treating RN: 04/26/1950 (71 y.o. F) Primary Care Provider: Harlan [name] Other Clinician: Referring Provider: Treating Provider/Extender: Holly [name] in Treatment: 12 Information Obtained from: Patient Chief Complaint Left plantar ulcer 11/12/2019; patient is here referred by podiatry for review of [name] wound on the left first plantar metatarsal head 03/30/2021; right second toe wound s/p Trauma 10/21/2021; First left metatarsal head wound Electronic Signature(s) Signed: 01/13/2022 4:37:04 PM By: Holly Shan DO Entered By: Holly [name] on 01/13/2022 15:35:02 -------------------------------------------------------------------------------- HPI Details Patient Name: Date of Service: Holly [name], Holly [name]. 01/13/2022 2:30 PM Medical Record Number: [id number] Patient Account Number: [id number] Date of Birth/Sex: Treating RN: April 02, 1950 (71 y.o. F) Primary Care Provider: Harlan [name] Other Clinician: Referring Provider: Treating Provider/Extender: Holly [name] in Treatment: 12 History of Present Illness HPI Description: 05/10/17 patient presents today for initial evaluation and our clinic concerning issues that she has been having with an ulceration on the plantar aspect of the left first metatarsal head. She has been seen [name] podiatrist Dr. Jacqualyn [name] for this since August 2018. Subsequently he has recommended bunion surgery as that seems to be the culprit for why there is pressure and friction occurring at the site causing the callous and subsequently the wound. 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 With that being said I do have notes from several of his visits one of which does include an x-ray documentation stating that she had [name] negative x-ray. There was obviously the bunion the notes I have extended from 11 deformity. With that being said she has not had an MRI fortunately this wound does not probe to bone. The notes I have extended from February 08, 2017 through April 14, 2017. During that course she was also placed on antibiotics to help with an infection two weeks ago and this was doxycycline. Fortunately that seems to have completely resolved any infection issues that she may have had. Unfortunately this is an ulcer that she has actually been dealing with since April 2018. She just initially was trying to treat it and care for it on her own. Her most recent hemoglobin A1c was 5.8, she is [name] non-smoker, she does have [name] right first toe amputation and [name] left third toe amputation. Fortunately she's not having any discomfort at the site. 05/17/17 on evaluation today patient's ulcer on the plantar aspect of the first metatarsal region appears to be close at this point. There does not appear to be any evidence of ulcer opening. This is on initial inspection. She has not really had any discomfort but honestly she really was not experiencing [name] lot of pain even before. There is no evidence of infection or fluctuation under the callous which is present at this point. ADMISSION 11/12/2019 This is [name] 71 year old woman that we previously had in this clinic in 2017 for two visits with [name] wound I think roughly in the same area. At that point she had [name] bunion in the left. She has apparently had surgery on this since then. Looking through care everywhere we can see that she has had follow-up with podiatry from  06/12/2019 through 10/28/2019 for [name] wound on the plantar aspect of her left first MTP. She states when  this started she was having severe knee pain on the right which forced her to alter her gait. She has recently received doxycycline which she is finished and Bactroban. She is back to using Medihoney on the wound. She is offloading with the Pegasys shoe. Past medical history includes type 2 diabetes with [name] recent hemoglobin A1c of 6.8, severe type II diabetic neuropathy, history of [name] CVA, stage III chronic renal failure hypertension, depression, of bilateral previous bunion surgery, right first toe amputation and [name] partial left third toe amputation for osteomyelitis. The patient states that she recently had work to do in her mother's house in [place] and so she was on her feet quite [name] lot which delayed the healing. CARLISE, [name] [name] ([id number]) [id number] Page 2 of 9 ABI in our clinic was 1.1 on the left 8/31; plantar aspect of her left first metatarsal head. Using silver collagen. This is probably [name] surgical wound 9/14; this is [name] patient who had [name] wound on the plantar aspect of her left first metatarsal head in the setting of [name] significant bunion deformity. We use cervical collagen and [name] offloading shoe. She has significant diabetic neuropathy probably some degree of [name] Charcot deformity in her feet. This is closed over today. Readmission 03/30/2021 Ms. Holly [name] is [name] 71 year old female with [name] past medical history of insulin-dependent controlled type 2 diabetes s/p right great toe amputation, right knee osteoarthritis and hypertension that presents to the clinic for [name] second right toe wound. She states she was getting out of the bathtub when her foot got caught the tub and the skin to her second toe was split open. She noticed this after the bath when she saw blood on the floor. She has neuropathy in her feet. She visited the ED following the event on 03/26/2021. They obtained x-rays that showed [name] fracture to the fifth toe but no fracture to the second toe. She was  started on doxycycline. She currently denies signs of infection. She has been keeping the area covered with [name] Band-Aid. Readmission 10/21/2021 Ms. Holly [name] is [name] 71 year old female with [name] past medical history of insulin dependent controlled type 2 diabetes complicated by peripheral neuropathy that presents to the clinic for [name] left met head wound. She has been following with Dr. Earleen Newport, podiatry for over [name] year for this issue. She has tried collagen, mupirocin ointment and Medihoney. She is at high fall risk and cannot tolerate [name] total contact cast. She has orthotics with specialized inserts for her current issue. She currently denies signs of infection. 8/10; patient presents for follow-up. Unfortunately Regranex was unaffordable at $1200. She has been using Medihoney to the wound bed. She reports aggressively offloading the area. She has offloading inserts. She denies signs of infection. 8/22; patient presents for follow-up. She has been using Medihoney to the wound bed with improvement in wound healing. She has no issues or complaints today. She will be out of town starting next week for the next 2 weeks. 9/11; patient presents for follow-up. She has been using Medihoney to the wound bed. She went on vacation and was last seen 3 weeks ago. She states she had tried to aggressively offload the wound bed. 9/26; patient presents for follow-up. She has been using Medihoney to the wound bed. She has no issues or complaints today. 10/13; patient presents for follow-up. She has been using Medihoney to  the wound bed. She has no issues or complaints today. We rediscussed the possibility of [name] total contact cast but she does not feel comfortable as she is [name] high fall risk and lives alone. 10/26; patient presents for follow-up. Has been using Medihoney to the wound bed. She has been approved for Grafix however has [name] 20% co-pay. Patient declined proceeding with this due to financial cost. Patient has no  issues or complaints today. Electronic Signature(s) Signed: 01/13/2022 4:37:04 PM By: Holly Shan DO Entered By: Holly [name] on 01/13/2022 15:35:39 -------------------------------------------------------------------------------- Physical Exam Details Patient Name: Date of Service: Holly [name], [name] RA H [name]. 01/13/2022 2:30 PM Medical Record Number: [id number] Patient Account Number: [id number] Date of Birth/Sex: Treating RN: 07-Dec-1950 (71 y.o. F) Primary Care Provider: Harlan [name] Other Clinician: Referring Provider: Treating Provider/Extender: [name] Weeks in Treatment: 12 Constitutional respirations regular, non-labored and within target range for patient.. Cardiovascular 2+ dorsalis pedis/posterior tibialis pulses. Psychiatric pleasant and cooperative. Notes Left foot: T the left first metatarsal there is an open wound with granulation tissue. The wound bed has undermining circumferentially. No increased warmth, o erythema or purulent drainage. Electronic Signature(s) Signed: 01/13/2022 4:37:04 PM By: Holly Shan DO Entered By: Holly [name] on 01/13/2022 15:36:24 Bischof, Jaela [name] ([id number]) [id number] Page 3 of 9 -------------------------------------------------------------------------------- Physician Orders Details Patient Name: Date of Service: Melton Alar [name]. 01/13/2022 2:30 PM Medical Record Number: [id number] Patient Account Number: [id number] Date of Birth/Sex: Treating RN: 05-26-1950 (71 y.o. Benjaman Lobe Primary Care Provider: Harlan [name] Other Clinician: Referring Provider: Treating Provider/Extender: Holly [name] in Treatment: 12 Verbal / Phone Orders: No Diagnosis Coding Follow-up Appointments Return appointment in 3 weeks. - w/ Dr. Heber Maddock Other: - Prism=Supplies Anesthetic (In clinic) Topical Lidocaine 5% applied to wound bed (In clinic)  Topical Lidocaine 4% applied to wound bed Cellular or Tissue Based Products Cellular or Tissue Based Product Type: - IVR for Grafix IVR for [employer]=$225 Copay Bathing/ Shower/ Hygiene May shower and wash wound with soap and water. Edema Control - Lymphedema / SCD / Other Avoid standing for long periods of time. Off-Loading Other: - Diabetic Shoes Relieve pressure/time standing of feet Additional Orders / Instructions Follow Nutritious Diet - Monitor/Control Blood Sugars Non Wound Condition Other Non Wound Condition Orders/Instructions: - Apply corn pad or gauze between great toe and 2nd toe. Wound Treatment Wound #4 - Metatarsal head first Wound Laterality: Plantar, Left Cleanser: Soap and Water 1 x Per Day/30 Days Discharge Instructions: May shower and wash wound with dial antibacterial soap and water prior to dressing change. Peri-Wound Care: Skin Prep (Generic) 1 x Per Day/30 Days Discharge Instructions: Use skin prep as directed Prim Dressing: MediHoney Gel, tube 1.5 (oz) 1 x Per Day/30 Days ary Discharge Instructions: Apply until Regranex obtained Prim Dressing: Optifoam Non-Adhesive Dressing, 4x4 in 1 x Per Day/30 Days ary Discharge Instructions: Apply as donut around wound Secondary Dressing: ALLEVYN Gentle Border, 3x3 (in/in) (Generic) 1 x Per Day/30 Days Discharge Instructions: Apply over primary dressing as directed. Electronic Signature(s) Signed: 01/13/2022 4:37:04 PM By: Holly Shan DO Entered By: Holly [name] on 01/13/2022 15:36:33 Prichett, Mora Bellman ([id number]) [id number] Page 4 of 9 -------------------------------------------------------------------------------- Problem List Details Patient Name: Date of Service: Melton Alar [name]. 01/13/2022 2:30 PM Medical Record Number: [id number] Patient Account Number: [id number] Date of Birth/Sex: Treating RN: 1950/09/19 (71 y.o. F) Primary Care Provider: Harlan [name] Other  Clinician: Referring Provider: Treating Provider/Extender: Holly [name]  in Treatment: 12 Active Problems ICD-10 Encounter Code Description Active Date MDM Diagnosis L97.522 Non-pressure chronic ulcer of other part of left foot with fat layer exposed 10/21/2021 No Yes E11.621 Type 2 diabetes mellitus with foot ulcer 10/21/2021 No Yes E11.42 Type 2 diabetes mellitus with diabetic polyneuropathy 10/21/2021 No Yes Inactive Problems Resolved Problems Electronic Signature(s) Signed: 01/13/2022 4:37:04 PM By: Holly Shan DO Entered By: Holly [name] on 01/13/2022 15:34:50 -------------------------------------------------------------------------------- Progress Note Details Patient Name: Date of Service: Holly [name], Holly [name]. 01/13/2022 2:30 PM Medical Record Number: [id number] Patient Account Number: [id number] Date of Birth/Sex: Treating RN: 19-May-1950 (71 y.o. F) Primary Care Provider: Harlan [name] Other Clinician: Referring Provider: Treating Provider/Extender: Holly [name] in Treatment: 12 Subjective Chief Complaint Information obtained from Patient Left plantar ulcer 11/12/2019; patient is here referred by podiatry for review of [name] wound on the left first plantar metatarsal head 03/30/2021; right second toe wound s/p Trauma 10/21/2021; First left metatarsal head wound History of Present Illness (HPI) 05/10/17 patient presents today for initial evaluation and our clinic concerning issues that she has been having with an ulceration on the plantar aspect of the left first metatarsal head. She has been seen [name] podiatrist Dr. Jacqualyn [name] for this since August 2018. Subsequently he has recommended bunion surgery as that seems to be the culprit for why there is pressure and friction occurring at the site causing the callous and subsequently the wound. 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 With that being said I do have notes from several of his visits one of which does include an x-ray documentation stating that she had [name] negative x-ray. There was obviously the bunion the notes I have extended from 11 deformity. With that being said she has not had an MRI fortunately this wound does not probe to bone. The notes I have extended from February 08, 2017 through April 14, 2017. During that course she was also placed on antibiotics to help with an infection two weeks ago and this was doxycycline. Fortunately that seems to have completely resolved any infection issues that she may have had. Unfortunately this is an ulcer that she has actually been dealing with since April 2018. She just initially was trying to treat it and care for it on her own. Her most recent hemoglobin A1c was 5.8, she is [name] non-smoker, she does have [name] right first toe amputation and [name] left third toe amputation. Fortunately she's not having any discomfort at the site. 05/17/17 on evaluation today patient's ulcer on the plantar aspect of the first metatarsal region appears to be close at this point. There does not appear to be any evidence of ulcer opening. This is on initial inspection. She has not really had any discomfort but honestly she really was not experiencing [name] lot of pain even before. There is no evidence of infection or fluctuation under the callous which is present at this point. Holly [name], Holly [name] ([id number]) [id number] Page 5 of 9 ADMISSION 11/12/2019 This is [name] 71 year old woman that we previously had in this clinic in 2017 for two visits with [name] wound I think roughly in the same area. At that point she had [name] bunion in the left. She has apparently had surgery on this since then. Looking through care everywhere we can see that she has had follow-up with podiatry from 06/12/2019  through 10/28/2019 for [name]  wound on the plantar aspect of her left first MTP. She states when this started she was having severe knee pain on the right which forced her to alter her gait. She has recently received doxycycline which she is finished and Bactroban. She is back to using Medihoney on the wound. She is offloading with the Pegasys shoe. Past medical history includes type 2 diabetes with [name] recent hemoglobin A1c of 6.8, severe type II diabetic neuropathy, history of [name] CVA, stage III chronic renal failure hypertension, depression, of bilateral previous bunion surgery, right first toe amputation and [name] partial left third toe amputation for osteomyelitis. The patient states that she recently had work to do in her mother's house in [place] and so she was on her feet quite [name] lot which delayed the healing. ABI in our clinic was 1.1 on the left 8/31; plantar aspect of her left first metatarsal head. Using silver collagen. This is probably [name] surgical wound 9/14; this is [name] patient who had [name] wound on the plantar aspect of her left first metatarsal head in the setting of [name] significant bunion deformity. We use cervical collagen and [name] offloading shoe. She has significant diabetic neuropathy probably some degree of [name] Charcot deformity in her feet. This is closed over today. Readmission 03/30/2021 Ms. Holly [name] is [name] 71 year old female with [name] past medical history of insulin-dependent controlled type 2 diabetes s/p right great toe amputation, right knee osteoarthritis and hypertension that presents to the clinic for [name] second right toe wound. She states she was getting out of the bathtub when her foot got caught the tub and the skin to her second toe was split open. She noticed this after the bath when she saw blood on the floor. She has neuropathy in her feet. She visited the ED following the event on 03/26/2021. They obtained x-rays that showed [name] fracture to the fifth toe but no fracture to the second toe. She was started on  doxycycline. She currently denies signs of infection. She has been keeping the area covered with [name] Band-Aid. Readmission 10/21/2021 Ms. Holly [name] is [name] 71 year old female with [name] past medical history of insulin dependent controlled type 2 diabetes complicated by peripheral neuropathy that presents to the clinic for [name] left met head wound. She has been following with Dr. Earleen Newport, podiatry for over [name] year for this issue. She has tried collagen, mupirocin ointment and Medihoney. She is at high fall risk and cannot tolerate [name] total contact cast. She has orthotics with specialized inserts for her current issue. She currently denies signs of infection. 8/10; patient presents for follow-up. Unfortunately Regranex was unaffordable at $1200. She has been using Medihoney to the wound bed. She reports aggressively offloading the area. She has offloading inserts. She denies signs of infection. 8/22; patient presents for follow-up. She has been using Medihoney to the wound bed with improvement in wound healing. She has no issues or complaints today. She will be out of town starting next week for the next 2 weeks. 9/11; patient presents for follow-up. She has been using Medihoney to the wound bed. She went on vacation and was last seen 3 weeks ago. She states she had tried to aggressively offload the wound bed. 9/26; patient presents for follow-up. She has been using Medihoney to the wound bed. She has no issues or complaints today. 10/13; patient presents for follow-up. She has been using Medihoney to the wound bed. She has no issues or complaints today.  We rediscussed the possibility of [name] total contact cast but she does not feel comfortable as she is [name] high fall risk and lives alone. 10/26; patient presents for follow-up. Has been using Medihoney to the wound bed. She has been approved for Grafix however has [name] 20% co-pay. Patient declined proceeding with this due to financial cost. Patient has no issues or  complaints today. Patient History Information obtained from Patient. Family History Cancer - Father, Hypertension - Mother,Father,Siblings, Stroke - Mother,Father, No family history of Diabetes, Heart Disease, Hereditary Spherocytosis, Kidney Disease, Lung Disease, Seizures, Thyroid Problems, Tuberculosis. Social History Never smoker, Marital Status - Single, Alcohol Use - Never, Drug Use - No History, Caffeine Use - Rarely. Medical History Eyes Patient has history of Cataracts Denies history of Glaucoma, Optic Neuritis Ear/Nose/Mouth/Throat Denies history of Chronic sinus problems/congestion, Middle ear problems Hematologic/Lymphatic Denies history of Anemia, Hemophilia, Human Immunodeficiency Virus, Lymphedema, Sickle Cell Disease Respiratory Patient has history of Sleep Apnea Denies history of Aspiration, Asthma, Chronic Obstructive Pulmonary Disease (COPD), Pneumothorax, Tuberculosis Cardiovascular Patient has history of Hypertension Denies history of Angina, Arrhythmia, Congestive Heart Failure, Coronary Artery Disease, Deep Vein Thrombosis, Hypotension, Myocardial Infarction, Peripheral Arterial Disease, Peripheral Venous Disease, Phlebitis, Vasculitis Gastrointestinal Denies history of Cirrhosis , Colitis, Crohnoos, Hepatitis [name], Hepatitis B, Hepatitis C Endocrine Patient has history of Type II Diabetes Denies history of Type I Diabetes Genitourinary Denies history of End Stage Renal Disease Immunological Denies history of Lupus Erythematosus, Raynaudoos, Scleroderma Integumentary (Skin) Denies history of History of Burn Musculoskeletal Patient has history of Osteoarthritis, Osteomyelitis - Hx in 2007 (left 3rd toe) Denies history of Gout, Rheumatoid Arthritis Neurologic Patient has history of Neuropathy Denies history of Dementia, Quadriplegia, Paraplegia, Seizure Disorder Oncologic Holly [name], Holly [name] ([id number]) [id number] Page 6 of  9 Patient has history of Received Radiation - 03/2020 Denies history of Received Chemotherapy Psychiatric Patient has history of Confinement Anxiety Denies history of Anorexia/bulimia Hospitalization/Surgery History - Amputation of right great toe. - Tendon repair. - Hysterectomy. - Osteomyelitis left 3rd toe. - cervical fusion with cage. - left breast lumpectomy. Medical [name] Surgical History Notes nd Constitutional Symptoms (General Health) morbid obesity Cardiovascular Takotsubo cardiomyopathy 2012, hyperlipidemia, Hx CVA, Tachycardia Genitourinary CKD stage 3 Musculoskeletal left leg weakness Neurologic CVA Oncologic hx endometrial cancer, left breast CA Psychiatric depression Objective Constitutional respirations regular, non-labored and within target range for patient.. Vitals Time Taken: 2:44 PM, Temperature: 98 F, Pulse: 66 bpm, Respiratory Rate: 18 breaths/min, Blood Pressure: 126/79 mmHg. Cardiovascular 2+ dorsalis pedis/posterior tibialis pulses. Psychiatric pleasant and cooperative. General Notes: Left foot: T the left first metatarsal there is an open wound with granulation tissue. The wound bed has undermining circumferentially. No o increased warmth, erythema or purulent drainage. Integumentary (Hair, Skin) Wound #4 status is Open. Original cause of wound was Gradually Appeared. The date acquired was: 10/02/2020. The wound has been in treatment 12 weeks. The wound is located on the Left,Plantar Metatarsal head first. The wound measures 0.7cm length x 0.5cm width x 0.2cm depth; 0.275cm^2 area and 0.055cm^3 volume. There is Fat Layer (Subcutaneous Tissue) exposed. There is no tunneling or undermining noted. There is [name] medium amount of serosanguineous drainage noted. The wound margin is distinct with the outline attached to the wound base. There is large (67-100%) red granulation within the wound bed. There is [name] small (1-33%) amount of necrotic tissue within the wound  bed including Adherent Slough. The periwound skin appearance exhibited: Callus. The periwound skin appearance did not exhibit: Crepitus, Excoriation, Induration, Rash,  Scarring, Dry/Scaly, Maceration, Atrophie Blanche, Cyanosis, Ecchymosis, Hemosiderin Staining, Mottled, Pallor, Rubor, Erythema. Periwound temperature was noted as No Abnormality. Assessment Active Problems ICD-10 Non-pressure chronic ulcer of other part of left foot with fat layer exposed Type 2 diabetes mellitus with foot ulcer Type 2 diabetes mellitus with diabetic polyneuropathy Patient's wound has shown improvement in size) since last clinic visit. I recommended continuing Medihoney. Continue aggressive offloading. Follow-up in 2 weeks. Plan Follow-up Appointments: Return appointment in 3 weeks. - w/ Dr. Heber [place] Other: - [facility]=Supplies Anesthetic: Holly [name] ([id number]) [id number] Page 7 of 9 (In clinic) Topical Lidocaine 5% applied to wound bed (In clinic) Topical Lidocaine 4% applied to wound bed Cellular or Tissue Based Products: Cellular or Tissue Based Product Type: - IVR for [employer] IVR for [employer]=$225 Copay Bathing/ Shower/ Hygiene: May shower and wash wound with soap and water. Edema Control - Lymphedema / SCD / Other: Avoid standing for long periods of time. Off-Loading: Other: - Diabetic Shoes Relieve pressure/time standing of feet Additional Orders / Instructions: Follow Nutritious Diet - Monitor/Control Blood Sugars Non Wound Condition: Other Non Wound Condition Orders/Instructions: - Apply corn pad or gauze between great toe and 2nd toe. WOUND #4: - Metatarsal head first Wound Laterality: Plantar, Left Cleanser: Soap and Water 1 x Per Day/30 Days Discharge Instructions: May shower and wash wound with dial antibacterial soap and water prior to dressing change. Peri-Wound Care: Skin Prep (Generic) 1 x Per Day/30 Days Discharge Instructions: Use skin prep as  directed Prim Dressing: MediHoney Gel, tube 1.5 (oz) 1 x Per Day/30 Days ary Discharge Instructions: Apply until Regranex obtained Prim Dressing: Optifoam Non-Adhesive Dressing, 4x4 in 1 x Per Day/30 Days ary Discharge Instructions: Apply as donut around wound Secondary Dressing: ALLEVYN Gentle Border, 3x3 (in/in) (Generic) 1 x Per Day/30 Days Discharge Instructions: Apply over primary dressing as directed. 1. Medihoney 2. Aggressive offloadingoodonut foam pad 3. Follow-up in 1 week Electronic Signature(s) Signed: 01/13/2022 4:37:04 PM By: Holly Shan DO Entered By: Holly [name] on 01/13/2022 15:37:05 -------------------------------------------------------------------------------- HxROS Details Patient Name: Date of Service: Holly [name], Holly [name]. 01/13/2022 2:30 PM Medical Record Number: [id number] Patient Account Number: [id number] Date of Birth/Sex: Treating RN: 09-03-1950 (71 y.o. F) Primary Care Provider: Harlan [name] Other Clinician: Referring Provider: Treating Provider/Extender: Holly [name] in Treatment: 12 Information Obtained From Patient Constitutional Symptoms (General Health) Medical History: Past Medical History Notes: morbid obesity Eyes Medical History: Positive for: Cataracts Negative for: Glaucoma; Optic Neuritis Ear/Nose/Mouth/Throat Medical History: Negative for: Chronic sinus problems/congestion; Middle ear problems Hematologic/Lymphatic Medical History: Negative for: Anemia; Hemophilia; Human Immunodeficiency Virus; Lymphedema; Sickle Cell Disease Respiratory [place], [place] [name] ([id number]) [phone].pdf Page 8 of 9 Medical History: Positive for: Sleep Apnea Negative for: Aspiration; Asthma; Chronic Obstructive Pulmonary Disease (COPD); Pneumothorax; Tuberculosis Cardiovascular Medical History: Positive for: Hypertension Negative for: Angina; Arrhythmia; Congestive Heart Failure; Coronary  Artery Disease; Deep Vein Thrombosis; Hypotension; Myocardial Infarction; Peripheral Arterial Disease; Peripheral Venous Disease; Phlebitis; Vasculitis Past Medical History Notes: Takotsubo cardiomyopathy 2012, hyperlipidemia, Hx CVA, Tachycardia Gastrointestinal Medical History: Negative for: Cirrhosis ; Colitis; Crohns; Hepatitis [name]; Hepatitis B; Hepatitis C Endocrine Medical History: Positive for: Type II Diabetes Negative for: Type I Diabetes Time with diabetes: over 20 years Treated with: Insulin, Oral agents Blood sugar tested every day: Yes Tested : daily Genitourinary Medical History: Negative for: End Stage Renal Disease Past Medical History Notes: CKD stage 3 Immunological Medical History: Negative for: Lupus Erythematosus; Raynauds; Scleroderma Integumentary (Skin) Medical History: Negative for: History  of Burn Musculoskeletal Medical History: Positive for: Osteoarthritis; Osteomyelitis - Hx in 2007 (left 3rd toe) Negative for: Gout; Rheumatoid Arthritis Past Medical History Notes: left leg weakness Neurologic Medical History: Positive for: Neuropathy Negative for: Dementia; Quadriplegia; Paraplegia; Seizure Disorder Past Medical History Notes: CVA Oncologic Medical History: Positive for: Received Radiation - 03/2020 Negative for: Received Chemotherapy Past Medical History Notes: hx endometrial cancer, left breast CA Psychiatric Medical History: Positive for: Confinement Anxiety Negative for: Anorexia/bulimia Past Medical History Notes: depression HBO Extended History Items Eyes: Cataracts [place], [place] [name] ([id number]) [id number] Page 9 of 9 Immunizations Pneumococcal Vaccine: Received Pneumococcal Vaccination: Yes Received Pneumococcal Vaccination On or After 60th Birthday: Yes Implantable Devices None Hospitalization / Surgery History Type of Hospitalization/Surgery Amputation of right great toe Tendon  repair Hysterectomy Osteomyelitis left 3rd toe cervical fusion with cage left breast lumpectomy Family and Social History Cancer: Yes - Father; Diabetes: No; Heart Disease: No; Hereditary Spherocytosis: No; Hypertension: Yes - Mother,Father,Siblings; Kidney Disease: No; Lung Disease: No; Seizures: No; Stroke: Yes - Mother,Father; Thyroid Problems: No; Tuberculosis: No; Never smoker; Marital Status - Single; Alcohol Use: Never; Drug Use: No History; Caffeine Use: Rarely; Financial Concerns: No; Food, Clothing or Shelter Needs: No; Support System Lacking: No; Transportation Concerns: No Electronic Signature(s) Signed: 01/13/2022 4:37:04 PM By: Holly Shan DO Entered By: Holly [name] on 01/13/2022 15:35:45 -------------------------------------------------------------------------------- SuperBill Details Patient Name: Date of Service: Holly [name], Holly [name]. 01/13/2022 Medical Record Number: [id number] Patient Account Number: [id number] Date of Birth/Sex: Treating RN: [birth year] (71 y.o. Tonita [place], [name] Primary Care Provider: Harlan [name] Other Clinician: Referring Provider: Treating Provider/Extender: [name] Weeks in Treatment: 12 Diagnosis Coding ICD-10 Codes Code Description 4421222461 Non-pressure chronic ulcer of other part of left foot with fat layer exposed E11.621 Type 2 diabetes mellitus with foot ulcer E11.42 Type 2 diabetes mellitus with diabetic polyneuropathy Facility Procedures : CPT4 Code: [id number] Description: 99213 - WOUND CARE VISIT-LEV 3 EST PT Modifier: Quantity: 1 Physician Procedures : CPT4 Code Description Modifier [id number] - WC PHYS LEVEL 3 - EST PT ICD-10 Diagnosis Description L97.522 Non-pressure chronic ulcer of other part of left foot with fat layer exposed E11.621 Type 2 diabetes mellitus with foot ulcer E11.42 Type 2  diabetes mellitus with diabetic polyneuropathy Quantity: 1 Electronic Signature(s) Signed:  01/13/2022 4:37:04 PM By: Holly Shan DO Entered By: Holly [name] on 01/13/2022 15:37:18

## 2022-01-13 NOTE — Progress Notes (Signed)
Nero, Kritika [name] ([id number]) [id number] Page 1 of 9 Visit Report for 01/13/2022 Arrival Information Details Patient Name: Date of Service: [name], [name]. 01/13/2022 2:30 PM Medical Record Number: [id number] Patient Account Number: [id number] Date of Birth/Sex: Treating RN: 05/02/1950 (71 y.o. F) Herrington, Taylor Primary Care Provider: White, Cynthia Other Clinician: Referring Provider: Treating Provider/Extender: Hoffman, Jessica White, Cynthia Weeks in Treatment: 12 Visit Information History Since Last Visit Added or deleted any medications: No Patient Arrived: Cane Any new allergies or adverse reactions: No Arrival Time: 14:44 Had [name] fall or experienced change in No Accompanied By: self activities of daily living that may affect Transfer Assistance: None risk of falls: Patient Identification Verified: Yes Signs or symptoms of abuse/neglect since last visito No Secondary Verification Process Completed: Yes Hospitalized since last visit: No Patient Requires Transmission-Based Precautions: No Implantable device outside of the clinic excluding No Patient Has Alerts: Yes cellular tissue based products placed in the center Patient Alerts: ABI's R=1.07 L=1.02 since last visit: Has Dressing in Place as Prescribed: Yes Pain Present Now: No Electronic Signature(s) Signed: 01/13/2022 4:24:03 PM By: Herrington, Taylor Entered By: Herrington, Taylor on 01/13/2022 14:44:28 -------------------------------------------------------------------------------- Clinic Level of Care Assessment Details Patient Name: Date of Service: [name], [name]. 01/13/2022 2:30 PM Medical Record Number: [id number] Patient Account Number: [id number] Date of Birth/Sex: Treating RN: 05/03/1950 (71 y.o. F) Breedlove, [name] Primary Care Provider: White, Cynthia Other Clinician: Referring Provider: Treating Provider/Extender: Hoffman, Jessica White, Cynthia Weeks in Treatment:  12 Clinic Level of Care Assessment Items TOOL 4 Quantity Score X- 1 0 Use when only an EandM is performed on FOLLOW-UP visit ASSESSMENTS - Nursing Assessment / Reassessment X- 1 10 Reassessment of Co-morbidities (includes updates in patient status) X- 1 5 Reassessment of Adherence to Treatment Plan ASSESSMENTS - Wound and Skin [name] ssessment / Reassessment X - Simple Wound Assessment / Reassessment - one wound 1 5 [] - 0 Complex Wound Assessment / Reassessment - multiple wounds [] - 0 Dermatologic / Skin Assessment (not related to wound area) ASSESSMENTS - Focused Assessment X- 1 5 Circumferential Edema Measurements - multi extremities [] - 0 Nutritional Assessment / Counseling / Intervention Botero, Penelope [name] ([id number]) [id number] Page 2 of 9 [] - 0 Lower Extremity Assessment (monofilament, tuning fork, pulses) [] - 0 Peripheral Arterial Disease Assessment (using hand held doppler) ASSESSMENTS - Ostomy and/or Continence Assessment and Care [] - 0 Incontinence Assessment and Management [] - 0 Ostomy Care Assessment and Management (repouching, etc.) PROCESS - Coordination of Care X - Simple Patient / Family Education for ongoing care 1 15 [] - 0 Complex (extensive) Patient / Family Education for ongoing care X- 1 10 Staff obtains Consents, Records, T Results / Process Orders est [] - 0 Staff telephones HHA, Nursing Homes / Clarify orders / etc [] - 0 Routine Transfer to another Facility (non-emergent condition) [] - 0 Routine Hospital Admission (non-emergent condition) [] - 0 New Admissions / Insurance Authorizations / Ordering NPWT Apligraf, etc. , [] - 0 Emergency Hospital Admission (emergent condition) X- 1 10 Simple Discharge Coordination [] - 0 Complex (extensive) Discharge Coordination PROCESS - Special Needs [] - 0 Pediatric / Minor Patient Management [] - 0 Isolation Patient Management [] - 0 Hearing / Language / Visual special  needs [] - 0 Assessment of Community assistance (transportation, D/C planning, etc.) [] - 0 Additional assistance / Altered mentation [] - 0 Support Surface(s) Assessment (bed, cushion, seat, etc.) INTERVENTIONS - Wound Cleansing /   Measurement X - Simple Wound Cleansing - one wound 1 5 [] - 0 Complex Wound Cleansing - multiple wounds X- 1 5 Wound Imaging (photographs - any number of wounds) [] - 0 Wound Tracing (instead of photographs) X- 1 5 Simple Wound Measurement - one wound [] - 0 Complex Wound Measurement - multiple wounds INTERVENTIONS - Wound Dressings X - Small Wound Dressing one or multiple wounds 1 10 [] - 0 Medium Wound Dressing one or multiple wounds [] - 0 Large Wound Dressing one or multiple wounds X- 1 5 Application of Medications - topical [] - 0 Application of Medications - injection INTERVENTIONS - Miscellaneous [] - 0 External ear exam [] - 0 Specimen Collection (cultures, biopsies, blood, body fluids, etc.) [] - 0 Specimen(s) / Culture(s) sent or taken to Lab for analysis [] - 0 Patient Transfer (multiple staff / Hoyer Lift / Similar devices) [] - 0 Simple Staple / Suture removal (25 or less) [] - 0 Complex Staple / Suture removal (26 or more) [] - 0 Hypo / Hyperglycemic Management (close monitor of Blood Glucose) Tellefsen, Ileane [name] ([id number]) [id number] Page 3 of 9 [] - 0 Ankle / Brachial Index (ABI) - do not check if billed separately X- 1 5 Vital Signs Has the patient been seen at the hospital within the last three years: Yes Total Score: 95 Level Of Care: New/Established - Level 3 Electronic Signature(s) Signed: 01/13/2022 4:44:37 PM By: Breedlove, [name] RN Entered By: Breedlove, [name] on 01/13/2022 15:12:44 -------------------------------------------------------------------------------- Encounter Discharge Information Details Patient Name: Date of Service: [name], [name]. 01/13/2022 2:30 PM Medical  Record Number: [id number] Patient Account Number: [id number] Date of Birth/Sex: Treating RN: 06/17/1950 (71 y.o. F) Breedlove, [name] Primary Care Provider: White, Cynthia Other Clinician: Referring Provider: Treating Provider/Extender: Hoffman, Jessica White, Cynthia Weeks in Treatment: 12 Encounter Discharge Information Items Discharge Condition: Stable Ambulatory Status: Ambulatory Discharge Destination: Home Transportation: Private Auto Accompanied By: self Schedule Follow-up Appointment: Yes Clinical Summary of Care: Patient Declined Electronic Signature(s) Signed: 01/13/2022 4:44:37 PM By: Breedlove, [name] RN Entered By: Breedlove, [name] on 01/13/2022 15:13:15 -------------------------------------------------------------------------------- Lower Extremity Assessment Details Patient Name: Date of Service: [name], [name]. 01/13/2022 2:30 PM Medical Record Number: [id number] Patient Account Number: [id number] Date of Birth/Sex: Treating RN: 12/05/1950 (71 y.o. F) Herrington, Taylor Primary Care Provider: White, Cynthia Other Clinician: Referring Provider: Treating Provider/Extender: Hoffman, Jessica White, Cynthia Weeks in Treatment: 12 Edema Assessment Assessed: [Left: No] [Right: No] Edema: [Left: N] [Right: o] Calf Left: Right: Point of Measurement: 34 cm From Medial Instep 46 cm Ankle Left: Right: Point of Measurement: 9 cm From Medial Instep 28 cm Vascular Assessment Byrer, Robinn [name] ([id number]) [Right:[id number] Page 4 of 9] Pulses: Dorsalis Pedis Palpable: [Left:Yes] Electronic Signature(s) Signed: 01/13/2022 4:24:03 PM By: Herrington, Taylor Entered By: Herrington, Taylor on 01/13/2022 14:46:25 -------------------------------------------------------------------------------- Multi Wound Chart Details Patient Name: Date of Service: [name], [name]. 01/13/2022 2:30 PM Medical Record Number: [id number] Patient Account  Number: [id number] Date of Birth/Sex: Treating RN: 12/24/1950 (71 y.o. F) Primary Care Provider: White, Cynthia Other Clinician: Referring Provider: Treating Provider/Extender: Hoffman, Jessica White, Cynthia Weeks in Treatment: 12 Vital Signs Height(in): Pulse(bpm): 66 Weight(lbs): Blood Pressure(mmHg): 126/79 Body Mass Index(BMI): Temperature(°F): 98 Respiratory Rate(breaths/min): 18 [4:Photos:] [N/[name]:N/[name]] Left, Plantar Metatarsal head first N/[name] N/[name] Wound Location: Gradually Appeared N/[name] N/[name] Wounding Event: Diabetic Wound/Ulcer of the Lower N/[name] N/[name] Primary Etiology: Extremity Cataracts, Sleep Apnea, Hypertension, N/[name] N/[name] Comorbid History: Type II Diabetes,   Osteoarthritis, Osteomyelitis, Neuropathy, Received Radiation, Confinement Anxiety 10/02/2020 N/[name] N/[name] Date Acquired: 12 N/[name] N/[name] Weeks of Treatment: Open N/[name] N/[name] Wound Status: No N/[name] N/[name] Wound Recurrence: 0.7x0.5x0.2 N/[name] N/[name] Measurements L x W x D (cm) 0.275 N/[name] N/[name] [name] (cm) : rea 0.055 N/[name] N/[name] Volume (cm) : 32.60% N/[name] N/[name] % Reduction in [name] rea: 55.30% N/[name] N/[name] % Reduction in Volume: Grade 1 N/[name] N/[name] Classification: Medium N/[name] N/[name] Exudate [name] mount: Serosanguineous N/[name] N/[name] Exudate Type: red, brown N/[name] N/[name] Exudate Color: Distinct, outline attached N/[name] N/[name] Wound Margin: Large (67-100%) N/[name] N/[name] Granulation [name] mount: Red N/[name] N/[name] Granulation Quality: Small (1-33%) N/[name] N/[name] Necrotic [name] mount: Fat Layer (Subcutaneous Tissue): Yes N/[name] N/[name] Exposed Structures: Fascia: No Tendon: No Muscle: No Joint: No Bone: No Medium (34-66%) N/[name] N/[name] Epithelialization: Callus: Yes N/[name] N/[name] Periwound Skin Texture: Excoriation: No Induration: No Aytes, Joene [name] ([id number]) [id number] Page 5 of 9 Crepitus: No Rash: No Scarring: No Maceration: No N/[name] N/[name] Periwound Skin Moisture: Dry/Scaly: No Atrophie Blanche: No N/[name] N/[name] Periwound Skin Color: Cyanosis: No Ecchymosis: No Erythema:  No Hemosiderin Staining: No Mottled: No Pallor: No Rubor: No No Abnormality N/[name] N/[name] Temperature: Treatment Notes Wound #4 (Metatarsal head first) Wound Laterality: Plantar, Left Cleanser Soap and Water Discharge Instruction: May shower and wash wound with dial antibacterial soap and water prior to dressing change. Peri-Wound Care Skin Prep Discharge Instruction: Use skin prep as directed Topical Primary Dressing MediHoney Gel, tube 1.5 (oz) Discharge Instruction: Apply until Regranex obtained Optifoam Non-Adhesive Dressing, 4x4 in Discharge Instruction: Apply as donut around wound Secondary Dressing ALLEVYN Gentle Border, 3x3 (in/in) Discharge Instruction: Apply over primary dressing as directed. Secured With Compression Wrap Compression Stockings Environmental education officer) Signed: 01/13/2022 4:37:04 PM By: [name] DO Entered By: [name] on 01/13/2022 15:34:54 -------------------------------------------------------------------------------- Multi-Disciplinary Care Plan Details Patient Name: Date of Service: [name], [name]. 01/13/2022 2:30 PM Medical Record Number: [id number] Patient Account Number: [id number] Date of Birth/Sex: Treating RN: 11/09/1950 (71 y.o. Tonita Phoenix, [name] Primary Care Chike Farrington: [name] Other Clinician: Referring Niambi Smoak: Treating Tienna Bienkowski/Extender: [name] in Treatment: 12 Active Inactive Nutrition Nursing Diagnoses: Impaired glucose control: actual or potential LILLIANA, TURNER [name] ([id number]) [phone].pdf Page 6 of 9 Goals: Patient/caregiver verbalizes understanding of need to maintain therapeutic glucose control per primary care physician Date Initiated: 10/21/2021 Target Resolution Date: 01/22/2022 Goal Status: Active Interventions: Assess HgA1c results as ordered upon admission and as needed Provide education on elevated blood sugars and impact on  wound healing Notes: Wound/Skin Impairment Nursing Diagnoses: Impaired tissue integrity Goals: Patient/caregiver will verbalize understanding of skin care regimen Date Initiated: 10/21/2021 Target Resolution Date: 01/22/2022 Goal Status: Active Ulcer/skin breakdown will have [name] volume reduction of 30% by week 4 Date Initiated: 10/21/2021 Date Inactivated: 11/29/2021 Target Resolution Date: 11/18/2021 Goal Status: Met Interventions: Assess patient/caregiver ability to obtain necessary supplies Assess patient/caregiver ability to perform ulcer/skin care regimen upon admission and as needed Assess ulceration(s) every visit Provide education on ulcer and skin care Treatment Activities: Topical wound management initiated : 10/21/2021 Notes: 11/29/21: Wound care regimen continues. Running IVR for skin sub Electronic Signature(s) Signed: 01/13/2022 4:44:37 PM By: Rhae Hammock RN Entered By: Rhae Hammock on 01/13/2022 15:11:35 -------------------------------------------------------------------------------- Pain Assessment Details Patient Name: Date of Service: [name], [name]. 01/13/2022 2:30 PM Medical Record Number: [id number] Patient Account Number: [id number] Date of Birth/Sex: Treating RN: 1950/08/22 (71 y.o. [name] Primary Care Elio Haden: [name] Other Clinician: Referring  Provider: Treating Provider/Extender: Hoffman, Jessica White, Cynthia Weeks in Treatment: 12 Active Problems Location of Pain Severity and Description of Pain Patient Has Paino No Site Locations Rate the pain. Karger, Bernedette [name] ([id number]) [id number] Page 7 of 9 Rate the pain. Current Pain Level: 0 Pain Management and Medication Current Pain Management: Electronic Signature(s) Signed: 01/13/2022 4:24:03 PM By: Herrington, Taylor Entered By: Herrington, Taylor on 01/13/2022  14:45:04 -------------------------------------------------------------------------------- Patient/Caregiver Education Details Patient Name: Date of Service: [name], [name]. 10/26/2023andnbsp2:30 PM Medical Record Number: [id number] Patient Account Number: [id number] Date of Birth/Gender: Treating RN: 03/29/1950 (71 y.o. F) Breedlove, [name] Primary Care Physician: White, Cynthia Other Clinician: Referring Physician: Treating Physician/Extender: Hoffman, Jessica White, Cynthia Weeks in Treatment: 12 Education Assessment Education Provided To: Patient Education Topics Provided Elevated Blood Sugar/ Impact on Healing: Methods: Explain/Verbal Responses: State content correctly Wound/Skin Impairment: Methods: Explain/Verbal Responses: State content correctly Electronic Signature(s) Signed: 01/13/2022 4:44:37 PM By: Breedlove, [name] RN Entered By: Breedlove, [name] on 01/13/2022 15:11:55 -------------------------------------------------------------------------------- Wound Assessment Details Patient Name: Date of Service: [name], [name]. 01/13/2022 2:30 PM Sotelo, Tyeshia [name] ([id number]) [id number] Page 8 of 9 Medical Record Number: [id number] Patient Account Number: [id number] Date of Birth/Sex: Treating RN: 01/04/1951 (71 y.o. F) Herrington, Taylor Primary Care Provider: White, Cynthia Other Clinician: Referring Provider: Treating Provider/Extender: Hoffman, Jessica White, Cynthia Weeks in Treatment: 12 Wound Status Wound Number: 4 Primary Diabetic Wound/Ulcer of the Lower Extremity Etiology: Wound Location: Left, Plantar Metatarsal head first Wound Open Wounding Event: Gradually Appeared Status: Date Acquired: 10/02/2020 Comorbid Cataracts, Sleep Apnea, Hypertension, Type II Diabetes, Weeks Of Treatment: 12 History: Osteoarthritis, Osteomyelitis, Neuropathy, Received Radiation, Clustered Wound: No Confinement Anxiety Photos Wound  Measurements Length: (cm) 0.7 Width: (cm) 0.5 Depth: (cm) 0.2 Area: (cm) 0.275 Volume: (cm) 0.055 % Reduction in Area: 32.6% % Reduction in Volume: 55.3% Epithelialization: Medium (34-66%) Tunneling: No Undermining: No Wound Description Classification: Grade 1 Wound Margin: Distinct, outline attached Exudate Amount: Medium Exudate Type: Serosanguineous Exudate Color: red, brown Foul Odor After Cleansing: No Slough/Fibrino Yes Wound Bed Granulation Amount: Large (67-100%) Exposed Structure Granulation Quality: Red Fascia Exposed: No Necrotic Amount: Small (1-33%) Fat Layer (Subcutaneous Tissue) Exposed: Yes Necrotic Quality: Adherent Slough Tendon Exposed: No Muscle Exposed: No Joint Exposed: No Bone Exposed: No Periwound Skin Texture Texture Color No Abnormalities Noted: No No Abnormalities Noted: No Callus: Yes Atrophie Blanche: No Crepitus: No Cyanosis: No Excoriation: No Ecchymosis: No Induration: No Erythema: No Rash: No Hemosiderin Staining: No Scarring: No Mottled: No Pallor: No Moisture Rubor: No No Abnormalities Noted: No Dry / Scaly: No Temperature / Pain Maceration: No Temperature: No Abnormality Treatment Notes Wound #4 (Metatarsal head first) Wound Laterality: Plantar, Left Cleanser Soap and Water Discharge Instruction: May shower and wash wound with dial antibacterial soap and water prior to dressing change. Bunt, Royal [name] ([id number]) [id number] Page 9 of 9 Peri-Wound Care Skin Prep Discharge Instruction: Use skin prep as directed Topical Primary Dressing MediHoney Gel, tube 1.5 (oz) Discharge Instruction: Apply until Regranex obtained Optifoam Non-Adhesive Dressing, 4x4 in Discharge Instruction: Apply as donut around wound Secondary Dressing ALLEVYN Gentle Border, 3x3 (in/in) Discharge Instruction: Apply over primary dressing as directed. Secured With Compression Wrap Compression  Stockings Add-Ons Electronic Signature(s) Signed: 01/13/2022 4:24:03 PM By: Herrington, Taylor Entered By: Herrington, Taylor on 01/13/2022 14:49:00 -------------------------------------------------------------------------------- Vitals Details Patient Name: Date of Service: [name], [name]. 01/13/2022 2:30 PM Medical Record Number: [id number] Patient Account Number: [id number] Date of Birth/Sex: Treating RN: 03/17/1951 (  71 y.o. [name] Primary Care Aysa Larivee: [name] Other Clinician: Referring Diamonte Stavely: Treating Almeta Geisel/Extender: [name] Weeks in Treatment: 12 Vital Signs Time Taken: 14:44 Temperature (F): 98 Pulse (bpm): 66 Respiratory Rate (breaths/min): 18 Blood Pressure (mmHg): 126/79 Reference Range: 80 - 120 mg / dl Electronic Signature(s) Signed: 01/13/2022 4:24:03 PM By: Adline Peals Entered By: Adline Peals on 01/13/2022 14:44:59

## 2022-01-26 DIAGNOSIS — Z853 Personal history of malignant neoplasm of breast: Secondary | ICD-10-CM | POA: Diagnosis not present

## 2022-01-27 DIAGNOSIS — H43813 Vitreous degeneration, bilateral: Secondary | ICD-10-CM | POA: Diagnosis not present

## 2022-01-27 DIAGNOSIS — E119 Type 2 diabetes mellitus without complications: Secondary | ICD-10-CM | POA: Diagnosis not present

## 2022-01-27 DIAGNOSIS — H34831 Tributary (branch) retinal vein occlusion, right eye, with macular edema: Secondary | ICD-10-CM | POA: Diagnosis not present

## 2022-02-03 ENCOUNTER — Encounter (HOSPITAL_BASED_OUTPATIENT_CLINIC_OR_DEPARTMENT_OTHER): Payer: PPO | Attending: Internal Medicine | Admitting: Internal Medicine

## 2022-02-03 DIAGNOSIS — E11621 Type 2 diabetes mellitus with foot ulcer: Secondary | ICD-10-CM | POA: Diagnosis not present

## 2022-02-03 DIAGNOSIS — Z794 Long term (current) use of insulin: Secondary | ICD-10-CM | POA: Diagnosis not present

## 2022-02-03 DIAGNOSIS — I129 Hypertensive chronic kidney disease with stage 1 through stage 4 chronic kidney disease, or unspecified chronic kidney disease: Secondary | ICD-10-CM | POA: Insufficient documentation

## 2022-02-03 DIAGNOSIS — L97522 Non-pressure chronic ulcer of other part of left foot with fat layer exposed: Secondary | ICD-10-CM | POA: Insufficient documentation

## 2022-02-03 DIAGNOSIS — E1151 Type 2 diabetes mellitus with diabetic peripheral angiopathy without gangrene: Secondary | ICD-10-CM | POA: Insufficient documentation

## 2022-02-03 DIAGNOSIS — N183 Chronic kidney disease, stage 3 unspecified: Secondary | ICD-10-CM | POA: Insufficient documentation

## 2022-02-03 DIAGNOSIS — E1142 Type 2 diabetes mellitus with diabetic polyneuropathy: Secondary | ICD-10-CM | POA: Insufficient documentation

## 2022-02-03 DIAGNOSIS — E1122 Type 2 diabetes mellitus with diabetic chronic kidney disease: Secondary | ICD-10-CM | POA: Diagnosis not present

## 2022-02-03 DIAGNOSIS — Z89411 Acquired absence of right great toe: Secondary | ICD-10-CM | POA: Insufficient documentation

## 2022-02-04 NOTE — Progress Notes (Signed)
YENI, JIGGETTS Holly ([id number]) [id number] Page 1 of 11 Visit Report for 02/03/2022 Chief Complaint Document Details Patient Name: Date of Service: Holly Holly. 02/03/2022 1:45 PM Medical Record Number: [id number] Patient Account Number: [id number] Date of Birth/Sex: Treating [name]: 18-May-1950 (71 y.o. F) Primary Care Provider: Harlan Holly Other Clinician: Referring Provider: Treating Provider/Extender: Holly Holly in Treatment: 15 Information Obtained from: Patient Chief Complaint Left plantar ulcer 11/12/2019; patient is here referred by podiatry for review of Holly wound on the left first plantar metatarsal head 03/30/2021; right second toe wound s/p Trauma 10/21/2021; First left metatarsal head wound Electronic Signature(s) Signed: 02/03/2022 5:05:23 PM By: Holly Holly Entered By: Holly Shan on 02/03/2022 15:13:57 -------------------------------------------------------------------------------- Debridement Details Patient Name: Date of Service: Holly Holly, Holly RA H Holly. 02/03/2022 1:45 PM Medical Record Number: [id number] Patient Account Number: [id number] Date of Birth/Sex: Treating [name]: May 26, 1950 (71 y.o. Holly Holly, Holly Holly Primary Care Provider: Harlan Holly Other Clinician: Referring Provider: Treating Provider/Extender: Holly Holly in Treatment: 15 Debridement Performed for Assessment: Wound #4 Left,Plantar Metatarsal head first Performed By: Physician Holly Shan, [name] Debridement Type: Debridement Severity of Tissue Pre Debridement: Fat layer exposed Level of Consciousness (Pre-procedure): Awake and Alert Pre-procedure Verification/Time Out Yes - 15:00 Taken: Start Time: 15:00 Pain Control: Lidocaine T Area Debrided (L x W): otal 1 (cm) x 0.4 (cm) = 0.4 (cm) Tissue and other material debrided: Viable, Non-Viable, Callus, Slough, Subcutaneous, Slough Level:  Skin/Subcutaneous Tissue Debridement Description: Excisional Instrument: Curette Bleeding: Minimum Hemostasis Achieved: Pressure End Time: 15:00 Procedural Pain: 0 Post Procedural Pain: 0 Response to Treatment: Procedure was tolerated well Level of Consciousness (Post- Awake and Alert procedure): Post Debridement Measurements of Total Wound Length: (cm) 1 Width: (cm) 0.4 Depth: (cm) 0.4 Volume: (cm) 0.126 Holly Holly ([id number]) [id number] Page 2 of 11 Character of Wound/Ulcer Post Debridement: Improved Severity of Tissue Post Debridement: Fat layer exposed Post Procedure Diagnosis Same as Pre-procedure Electronic Signature(s) Signed: 02/03/2022 5:05:23 PM By: Holly Holly Signed: 02/04/2022 12:09:11 PM By: Holly Holly Entered By: Holly Hammock on 02/03/2022 15:04:46 -------------------------------------------------------------------------------- HPI Details Patient Name: Date of Service: Holly Holly, Holly RA H Holly. 02/03/2022 1:45 PM Medical Record Number: [id number] Patient Account Number: [id number] Date of Birth/Sex: Treating [name]: 10-Mar-1951 (71 y.o. F) Primary Care Provider: Harlan Holly Other Clinician: Referring Provider: Treating Provider/Extender: Holly Holly in Treatment: 15 History of Present Illness HPI Description: 05/10/17 patient presents today for initial evaluation and our clinic concerning issues that she has been having with an ulceration on the plantar aspect of the left first metatarsal head. She has been seen Holly Holly for this since August 2018. Subsequently he has recommended bunion surgery as that seems to be the culprit for why there is pressure and friction occurring at the site causing the callous and subsequently the wound. 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 With that being said I [name] have notes from several of his visits one of which does include an x-ray documentation stating that she had Holly negative x-ray. There was obviously the bunion the notes I have extended from 11 deformity. With that being said she has not had an MRI fortunately this wound does not probe to bone. The notes I have extended from February 08, 2017 through April 14, 2017. During that course she was also placed on antibiotics to help with an infection two weeks ago and this was doxycycline. Fortunately that seems to have completely resolved any infection issues that she may have had. Unfortunately this is an ulcer that she has actually been dealing with since April 2018. She just initially was trying to treat it and care for it on her own. Her most recent hemoglobin A1c was 5.8, she is Holly non-smoker, she does have Holly right first toe amputation and Holly left third toe amputation. Fortunately she's not having any discomfort at the site. 05/17/17 on evaluation today patient's ulcer on the plantar aspect of the first metatarsal region appears to be close at this point. There does not appear to be any evidence of ulcer opening. This is on initial inspection. She has not really had any discomfort but honestly she really was not experiencing Holly lot of pain even before. There is no evidence of infection or fluctuation under the callous which is present at this point. ADMISSION 11/12/2019 This is Holly 71 year old woman that we previously had in this clinic in 2017 for two visits with Holly wound I think roughly in the same area. At that point she had Holly bunion in the left. She has apparently had surgery on this since then. Looking through care everywhere we can see that she has had follow-up with podiatry from 06/12/2019 through 10/28/2019 for Holly wound on the plantar aspect of her left first MTP. She states when this started she was having severe knee pain on the right  which forced her to alter her gait. She has recently received doxycycline which she is finished and Bactroban. She is back to using Medihoney on the wound. She is offloading with the Pegasys shoe. Past medical history includes type 2 diabetes with Holly recent hemoglobin A1c of 6.8, severe type II diabetic neuropathy, history of Holly CVA, stage III chronic renal failure hypertension, depression, of bilateral previous bunion surgery, right first toe amputation and Holly partial left third toe amputation for osteomyelitis. The patient states that she recently had work to [name] in her mother's house in [place] and so she was on her feet quite Holly lot which delayed the healing. ABI in our clinic was 1.1 on the left 8/31; plantar aspect of her left first metatarsal head. Using silver collagen. This is probably Holly surgical wound 9/14; this is Holly patient who had Holly wound on the plantar aspect of her left first metatarsal head in the setting of Holly significant bunion deformity. We use cervical collagen and Holly offloading shoe. She has significant diabetic neuropathy probably some degree of Holly Charcot deformity in her feet. This is closed over today. Readmission 03/30/2021 Holly Holly is Holly 71 year old female with Holly past medical history of insulin-dependent controlled type 2 diabetes s/p right great toe amputation, right knee osteoarthritis and hypertension that presents to the clinic for Holly second right toe wound. She states she was getting out of the bathtub when her foot got caught the tub and the skin to her second toe was split open. She noticed this after the bath when she saw blood on the floor. She has neuropathy in her feet. She visited the ED following the event on 03/26/2021. They obtained x-rays that showed Holly fracture to the fifth toe but no fracture to the second toe. She was started on doxycycline. She currently denies signs of infection. She has been keeping the area covered with Holly Band-Aid. Readmission  10/21/2021 Holly Holly is Holly 71 year old female with Holly past medical history of insulin dependent controlled type 2 diabetes complicated by peripheral neuropathy that presents to the clinic for Holly left met head wound. She has been following with Dr. Earleen Newport, podiatry for over Holly year for this issue. She has tried collagen, mupirocin ointment and Medihoney. She is at high fall risk and cannot tolerate Holly total contact cast. She has orthotics with specialized inserts for her current issue. She currently denies signs of infection. 8/10; patient presents for follow-up. Unfortunately Regranex was unaffordable at $1200. She has been using Medihoney to the wound bed. She reports aggressively offloading the area. She has offloading inserts. She denies signs of infection. 8/22; patient presents for follow-up. She has been using Medihoney to the wound bed with improvement in wound healing. She has no issues or complaints today. She will be out of town starting next week for the next 2 weeks. Holly Holly, Holly Holly ([id number]) [id number] Page 3 of 11 9/11; patient presents for follow-up. She has been using Medihoney to the wound bed. She went on vacation and was last seen 3 weeks ago. She states she had tried to aggressively offload the wound bed. 9/26; patient presents for follow-up. She has been using Medihoney to the wound bed. She has no issues or complaints today. 10/13; patient presents for follow-up. She has been using Medihoney to the wound bed. She has no issues or complaints today. We rediscussed the possibility of Holly total contact cast but she does not feel comfortable as she is Holly high fall risk and lives alone. 10/26; patient presents for follow-up. Has been using Medihoney to the wound bed. She has been approved for Grafix however has Holly 20% co-pay. Patient declined proceeding with this due to financial cost. Patient has no issues or complaints today. 11/16; patient presents for  follow-up. She has been using Medihoney to the wound bed. She has been using her diabetic shoes with an offloading foam donut. She declines total contact cast placement today. Electronic Signature(s) Signed: 02/03/2022 5:05:23 PM By: Holly Holly Entered By: Holly Shan on 02/03/2022 15:14:49 -------------------------------------------------------------------------------- Physical Exam Details Patient Name: Date of Service: Holly Holly, Holly RA H Holly. 02/03/2022 1:45 PM Medical Record Number: [id number] Patient Account Number: [id number] Date of Birth/Sex: Treating [name]: 11-03-1950 (71 y.o. F) Primary Care Provider: Harlan Holly Other Clinician: Referring Provider: Treating Provider/Extender: [name] Weeks in Treatment: 15 Constitutional respirations regular, non-labored and within target range for patient.. Cardiovascular 2+ dorsalis pedis/posterior tibialis pulses. Psychiatric pleasant and cooperative. Notes Left foot: T the left first metatarsal there is an open wound with granulation tissue, nonviable tissue and callus. Postdebridement there is healthy granulation o tissue with no undermining. No signs of surrounding infection including increased warmth, erythema or purulent drainage. Electronic Signature(s) Signed: 02/03/2022 5:05:23 PM By: Holly Holly Entered By: Holly Shan on 02/03/2022 15:16:08 -------------------------------------------------------------------------------- Physician Orders Details Patient Name: Date of Service: Holly Holly, Holly RA H Holly. 02/03/2022 1:45 PM Medical Record Number: [id number] Patient Account Number: [id number] Date of Birth/Sex: Treating [name]: 24-Jun-1950 (71 y.o. Benjaman Lobe Primary Care Provider: Harlan Holly Other Clinician: Referring Provider: Treating Provider/Extender: Holly Holly in Treatment: 15 Verbal / Phone Orders: No Diagnosis Coding Follow-up  Appointments ppointment in 2 weeks. - w/ Dr. Heber Perkins Return Holly Other: - [employer] [place], [place] ([id number]) [id number] Page 4 of 11 Anesthetic (In clinic) Topical Lidocaine 5% applied to wound bed (In clinic) Topical Lidocaine  4% applied to wound bed Cellular or Tissue Based Products Cellular or Tissue Based Product Type: - IVR for Grafix IVR for [employer]=$225 Cop[place] Bathing/ Shower/ Hygiene May shower and wash wound with soap and water. Edema Control - Lymphedema / SCD / Other Avoid standing for long periods of time. Off-Loading Other: - Diabetic Shoes Relieve pressure/time standing of feet ***Use surgical shoe with peg assist insert with cutout for wound for offloading*** Additional Orders / Instructions Follow Nutritious Diet - Monitor/Control Blood Sugars Non Wound Condition Other Non Wound Condition Orders/Instructions: - Apply corn pad or gauze between great toe and 2nd toe. Wound Treatment Wound #4 - Metatarsal head first Wound Laterality: Plantar, Left Cleanser: Soap and Water 1 x Per Day/30 Days Discharge Instructions: May shower and wash wound with dial antibacterial soap and water prior to dressing change. Cleanser: Wound Cleanser (DME) (Generic) 1 x Per Day/30 Days Discharge Instructions: Cleanse the wound with wound cleanser prior to applying Holly clean dressing using gauze sponges, not tissue or cotton balls. Peri-Wound Care: Skin Prep (DME) (Generic) 1 x Per Day/30 Days Discharge Instructions: Use skin prep as directed Prim Dressing: KerraCel Ag Gelling Fiber Dressing, 4x5 in (silver alginate) (DME) (Generic) 1 x Per Day/30 Days ary Discharge Instructions: Apply silver alginate to wound bed as instructed Prim Dressing: MediHoney Gel, tube 1.5 (oz) 1 x Per Day/30 Days ary Discharge Instructions: Apply until Regranex obtained Prim Dressing: Optifoam Non-Adhesive Dressing, 4x4 in (DME) (Generic) 1 x Per Day/30 Days ary Discharge  Instructions: Apply as donut around wound Secondary Dressing: ALLEVYN Gentle Border, 3x3 (in/in) (DME) (Generic) 1 x Per Day/30 Days Discharge Instructions: Apply over primary dressing as directed. Electronic Signature(s) Signed: 02/03/2022 5:05:23 PM By: Holly Holly Entered By: Holly Shan on 02/03/2022 15:16:18 -------------------------------------------------------------------------------- Problem List Details Patient Name: Date of Service: Holly Holly, Holly RA H Holly. 02/03/2022 1:45 PM Medical Record Number: [id number] Patient Account Number: [id number] Date of Birth/Sex: Treating [name]: 07/14/1950 (71 y.o. F) Primary Care Provider: Harlan Holly Other Clinician: Referring Provider: Treating Provider/Extender: Holly Holly in Treatment: 15 Active Problems ICD-10 Encounter Code Description Active Date MDM EMBER, HENRIKSON Holly ([id number]) [id number] Page 5 of 11 Code Description Active Date MDM Diagnosis L97.522 Non-pressure chronic ulcer of other part of left foot with fat layer exposed 10/21/2021 No Yes E11.621 Type 2 diabetes mellitus with foot ulcer 10/21/2021 No Yes E11.42 Type 2 diabetes mellitus with diabetic polyneuropathy 10/21/2021 No Yes Inactive Problems Resolved Problems Electronic Signature(s) Signed: 02/03/2022 5:05:23 PM By: Holly Holly Entered By: Holly Shan on 02/03/2022 15:13:38 -------------------------------------------------------------------------------- Progress Note Details Patient Name: Date of Service: Holly Holly, Holly RA H Holly. 02/03/2022 1:45 PM Medical Record Number: [id number] Patient Account Number: [id number] Date of Birth/Sex: Treating [name]: [birth year] (71 y.o. F) Primary Care Provider: Harlan Holly Other Clinician: Referring Provider: Treating Provider/Extender: Holly Holly in Treatment: 15 Subjective Chief Complaint Information obtained from Patient Left  plantar ulcer 11/12/2019; patient is here referred by podiatry for review of Holly wound on the left first plantar metatarsal head 03/30/2021; right second toe wound s/p Trauma 10/21/2021; First left metatarsal head wound History of Present Illness (HPI) 05/10/17 patient presents today for initial evaluation and our clinic concerning issues that she has been having with an ulceration on the plantar aspect of the left first metatarsal head. She has been seen Holly Holly for this since August 2018. Subsequently he has recommended bunion surgery as that seems to be the culprit for  why there is pressure and friction occurring at the site causing the callous and subsequently the wound. 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 With that being said I [name] have notes from several of his visits one of which does include an x-ray documentation stating that she had Holly negative x-ray. There was obviously the bunion the notes I have extended from 11 deformity. With that being said she has not had an MRI fortunately this wound does not probe to bone. The notes I have extended from February 08, 2017 through April 14, 2017. During that course she was also placed on antibiotics to help with an infection two weeks ago and this was doxycycline. Fortunately that seems to have completely resolved any infection issues that she may have had. Unfortunately this is an ulcer that she has actually been dealing with since April 2018. She just initially was trying to treat it and care for it on her own. Her most recent hemoglobin A1c was 5.8, she is Holly non-smoker, she does have Holly right first toe amputation and Holly left third toe amputation. Fortunately she's not having any discomfort at the site. 05/17/17 on evaluation today patient's ulcer on the plantar aspect of the first metatarsal region  appears to be close at this point. There does not appear to be any evidence of ulcer opening. This is on initial inspection. She has not really had any discomfort but honestly she really was not experiencing Holly lot of pain even before. There is no evidence of infection or fluctuation under the callous which is present at this point. ADMISSION 11/12/2019 This is Holly 71 year old woman that we previously had in this clinic in 2017 for two visits with Holly wound I think roughly in the same area. At that point she had Holly bunion in the left. She has apparently had surgery on this since then. Looking through care everywhere we can see that she has had follow-up with podiatry from 06/12/2019 through 10/28/2019 for Holly wound on the plantar aspect of her left first MTP. She states when this started she was having severe knee pain on the right which forced her to alter her gait. She has recently received doxycycline which she is finished and Bactroban. She is back to using Medihoney on the wound. She is offloading with the Pegasys shoe. Past medical history includes type 2 diabetes with Holly recent hemoglobin A1c of 6.8, severe type II diabetic neuropathy, history of Holly CVA, stage III chronic renal failure hypertension, depression, of bilateral previous bunion surgery, right first toe amputation and Holly partial left third toe amputation for osteomyelitis. The patient states that she recently had work to [name] in her mother's house in [place] and so she was on her feet quite Holly lot which delayed the healing. ABI in our clinic was 1.1 on the left 8/31; plantar aspect of her left first metatarsal head. Using silver collagen. This is probably Holly surgical wound 9/14; this is Holly patient who had Holly wound on the plantar aspect of her left first metatarsal head in the setting of Holly significant bunion deformity. We use cervical collagen and Holly offloading shoe. She has significant diabetic neuropathy probably some degree of Holly Charcot deformity in  her feet. This is closed over today. Holly Holly, Holly Holly ([id number]) [id number] Page 6 of 11 Readmission 03/30/2021 Holly Holly is Holly  71 year old female with Holly past medical history of insulin-dependent controlled type 2 diabetes s/p right great toe amputation, right knee osteoarthritis and hypertension that presents to the clinic for Holly second right toe wound. She states she was getting out of the bathtub when her foot got caught the tub and the skin to her second toe was split open. She noticed this after the bath when she saw blood on the floor. She has neuropathy in her feet. She visited the ED following the event on 03/26/2021. They obtained x-rays that showed Holly fracture to the fifth toe but no fracture to the second toe. She was started on doxycycline. She currently denies signs of infection. She has been keeping the area covered with Holly Band-Aid. Readmission 10/21/2021 Holly Holly is Holly 71 year old female with Holly past medical history of insulin dependent controlled type 2 diabetes complicated by peripheral neuropathy that presents to the clinic for Holly left met head wound. She has been following with Dr. Earleen Newport, podiatry for over Holly year for this issue. She has tried collagen, mupirocin ointment and Medihoney. She is at high fall risk and cannot tolerate Holly total contact cast. She has orthotics with specialized inserts for her current issue. She currently denies signs of infection. 8/10; patient presents for follow-up. Unfortunately Regranex was unaffordable at $1200. She has been using Medihoney to the wound bed. She reports aggressively offloading the area. She has offloading inserts. She denies signs of infection. 8/22; patient presents for follow-up. She has been using Medihoney to the wound bed with improvement in wound healing. She has no issues or complaints today. She will be out of town starting next week for the next 2 weeks. 9/11; patient presents for  follow-up. She has been using Medihoney to the wound bed. She went on vacation and was last seen 3 weeks ago. She states she had tried to aggressively offload the wound bed. 9/26; patient presents for follow-up. She has been using Medihoney to the wound bed. She has no issues or complaints today. 10/13; patient presents for follow-up. She has been using Medihoney to the wound bed. She has no issues or complaints today. We rediscussed the possibility of Holly total contact cast but she does not feel comfortable as she is Holly high fall risk and lives alone. 10/26; patient presents for follow-up. Has been using Medihoney to the wound bed. She has been approved for Grafix however has Holly 20% co-pay. Patient declined proceeding with this due to financial cost. Patient has no issues or complaints today. 11/16; patient presents for follow-up. She has been using Medihoney to the wound bed. She has been using her diabetic shoes with an offloading foam donut. She declines total contact cast placement today. Patient History Information obtained from Patient. Family History Cancer - Father, Hypertension - Mother,Father,Siblings, Stroke - Mother,Father, No family history of Diabetes, Heart Disease, Hereditary Spherocytosis, Kidney Disease, Lung Disease, Seizures, Thyroid Problems, Tuberculosis. Social History Never smoker, Marital Status - Single, Alcohol Use - Never, Drug Use - No History, Caffeine Use - Rarely. Medical History Eyes Patient has history of Cataracts Denies history of Glaucoma, Optic Neuritis Ear/Nose/Mouth/Throat Denies history of Chronic sinus problems/congestion, Middle ear problems Hematologic/Lymphatic Denies history of Anemia, Hemophilia, Human Immunodeficiency Virus, Lymphedema, Sickle Cell Disease Respiratory Patient has history of Sleep Apnea Denies history of Aspiration, Asthma, Chronic Obstructive Pulmonary Disease (COPD), Pneumothorax, Tuberculosis Cardiovascular Patient has  history of Hypertension Denies history of Angina, Arrhythmia, Congestive Heart Failure, Coronary Artery Disease, Deep Vein Thrombosis, Hypotension,  Myocardial Infarction, Peripheral Arterial Disease, Peripheral Venous Disease, Phlebitis, Vasculitis Gastrointestinal Denies history of Cirrhosis , Colitis, Crohnoos, Hepatitis Holly, Hepatitis B, Hepatitis C Endocrine Patient has history of Type II Diabetes Denies history of Type I Diabetes Genitourinary Denies history of End Stage Renal Disease Immunological Denies history of Lupus Erythematosus, Raynaudoos, Scleroderma Integumentary (Skin) Denies history of History of Burn Musculoskeletal Patient has history of Osteoarthritis, Osteomyelitis - Hx in 2007 (left 3rd toe) Denies history of Gout, Rheumatoid Arthritis Neurologic Patient has history of Neuropathy Denies history of Dementia, Quadriplegia, Paraplegia, Seizure Disorder Oncologic Patient has history of Received Radiation - 03/2020 Denies history of Received Chemotherapy Psychiatric Patient has history of Confinement Anxiety Denies history of Anorexia/bulimia Hospitalization/Surgery History - Amputation of right great toe. - Tendon repair. - Hysterectomy. - Osteomyelitis left 3rd toe. - cervical fusion with cage. - left breast lumpectomy. Medical Holly Surgical History Notes nd Constitutional Symptoms (General Health) morbid obesity Cardiovascular Takotsubo cardiomyopathy 2012, hyperlipidemia, Hx CVA, Tachycardia Genitourinary Holly Holly, Holly Holly ([id number]) [id number] Page 7 of 11 CKD stage 3 Musculoskeletal left leg weakness Neurologic CVA Oncologic hx endometrial cancer, left breast CA Psychiatric depression Objective Constitutional respirations regular, non-labored and within target range for patient.. Vitals Time Taken: 2:06 PM, Temperature: 98.2 F, Pulse: 90 bpm, Respiratory Rate: 18 breaths/min, Blood Pressure: 101/65  mmHg. Cardiovascular 2+ dorsalis pedis/posterior tibialis pulses. Psychiatric pleasant and cooperative. General Notes: Left foot: T the left first metatarsal there is an open wound with granulation tissue, nonviable tissue and callus. Postdebridement there is o healthy granulation tissue with no undermining. No signs of surrounding infection including increased warmth, erythema or purulent drainage. Integumentary (Hair, Skin) Wound #4 status is Open. Original cause of wound was Gradually Appeared. The date acquired was: 10/02/2020. The wound has been in treatment 15 weeks. The wound is located on the Left,Plantar Metatarsal head first. The wound measures 1cm length x 0.4cm width x 0.2cm depth; 0.314cm^2 area and 0.063cm^3 volume. There is Fat Layer (Subcutaneous Tissue) exposed. There is no tunneling or undermining noted. There is Holly medium amount of serosanguineous drainage noted. The wound margin is distinct with the outline attached to the wound base. There is large (67-100%) red granulation within the wound bed. There is Holly small (1-33%) amount of necrotic tissue within the wound bed. The periwound skin appearance exhibited: Callus. The periwound skin appearance did not exhibit: Crepitus, Excoriation, Induration, Rash, Scarring, Dry/Scaly, Maceration, Atrophie Blanche, Cyanosis, Ecchymosis, Hemosiderin Staining, Mottled, Pallor, Rubor, Erythema. Periwound temperature was noted as No Abnormality. Assessment Active Problems ICD-10 Non-pressure chronic ulcer of other part of left foot with fat layer exposed Type 2 diabetes mellitus with foot ulcer Type 2 diabetes mellitus with diabetic polyneuropathy Patient's wound appears well-healing. I debrided nonviable tissue and callus. At this time I recommended the total contact cast however patient declined. She states she is Holly high fall risk. I recommended Holly surgical shoe with the Pegasys. She will try this. I recommended continuing Medihoney and  adding silver alginate. Unfortunately Granix and skin substitute was too costly for the patient. She knows that without pressure relief she will not heal this wound. Procedures Wound #4 Pre-procedure diagnosis of Wound #4 is Holly Diabetic Wound/Ulcer of the Lower Extremity located on the Left,Plantar Metatarsal head first .Severity of Tissue Pre Debridement is: Fat layer exposed. There was Holly Excisional Skin/Subcutaneous Tissue Debridement with Holly total area of 0.4 sq cm performed by Holly Shan, [name]. With the following instrument(s): Curette to remove Viable and Non-Viable tissue/material. Material removed  includes Callus, Subcutaneous Tissue, and Slough after achieving pain control using Lidocaine. No specimens were taken. Holly time out was conducted at 15:00, prior to the start of the procedure. Holly Minimum amount of bleeding was controlled with Pressure. The procedure was tolerated well with Holly pain level of 0 throughout and Holly pain level of 0 following the procedure. Post Debridement Measurements: 1cm length x 0.4cm width x 0.4cm depth; 0.126cm^3 volume. Character of Wound/Ulcer Post Debridement is improved. Severity of Tissue Post Debridement is: Fat layer exposed. Post procedure Diagnosis Wound #4: Same as Pre-Procedure Plan Holly Holly, GLAZA Holly ([id number]) [id number] Page 8 of 11 Follow-up Appointments: Return Appointment in 2 weeks. - w/ Dr. Heber [place] Other: - Prism=Supplies Anesthetic: (In clinic) Topical Lidocaine 5% applied to wound bed (In clinic) Topical Lidocaine 4% applied to wound bed Cellular or Tissue Based Products: Cellular or Tissue Based Product Type: - IVR for Grafix IVR for [employer]=$225 Copay Bathing/ Shower/ Hygiene: May shower and wash wound with soap and water. Edema Control - Lymphedema / SCD / Other: Avoid standing for long periods of time. Off-Loading: Other: - Diabetic Shoes Relieve pressure/time standing of feet ***Use surgical shoe with  peg assist insert with cutout for wound for offloading*** Additional Orders / Instructions: Follow Nutritious Diet - Monitor/Control Blood Sugars Non Wound Condition: Other Non Wound Condition Orders/Instructions: - Apply corn pad or gauze between great toe and 2nd toe. WOUND #4: - Metatarsal head first Wound Laterality: Plantar, Left Cleanser: Soap and Water 1 x Per Day/30 Days Discharge Instructions: May shower and wash wound with dial antibacterial soap and water prior to dressing change. Cleanser: Wound Cleanser (DME) (Generic) 1 x Per Day/30 Days Discharge Instructions: Cleanse the wound with wound cleanser prior to applying Holly clean dressing using gauze sponges, not tissue or cotton balls. Peri-Wound Care: Skin Prep (DME) (Generic) 1 x Per Day/30 Days Discharge Instructions: Use skin prep as directed Prim Dressing: KerraCel Ag Gelling Fiber Dressing, 4x5 in (silver alginate) (DME) (Generic) 1 x Per Day/30 Days ary Discharge Instructions: Apply silver alginate to wound bed as instructed Prim Dressing: MediHoney Gel, tube 1.5 (oz) 1 x Per Day/30 Days ary Discharge Instructions: Apply until Regranex obtained Prim Dressing: Optifoam Non-Adhesive Dressing, 4x4 in (DME) (Generic) 1 x Per Day/30 Days ary Discharge Instructions: Apply as donut around wound Secondary Dressing: ALLEVYN Gentle Border, 3x3 (in/in) (DME) (Generic) 1 x Per Day/30 Days Discharge Instructions: Apply over primary dressing as directed. 1. In office sharp debridement 2. Silver alginate with Medihoney 3. Aggressive offloadingoosurgical shoe with Pegasys Electronic Signature(s) Signed: 02/03/2022 5:05:23 PM By: Holly Holly Entered By: Holly Shan on 02/03/2022 15:20:48 -------------------------------------------------------------------------------- HxROS Details Patient Name: Date of Service: Holly Holly, Holly RA H Holly. 02/03/2022 1:45 PM Medical Record Number: [id number] Patient Account Number:  [id number] Date of Birth/Sex: Treating [name]: 07-17-1950 (71 y.o. F) Primary Care Provider: Harlan Holly Other Clinician: Referring Provider: Treating Provider/Extender: Holly Holly in Treatment: 15 Information Obtained From Patient Constitutional Symptoms (General Health) Medical History: Past Medical History Notes: morbid obesity Eyes Medical History: Positive for: Cataracts Negative for: Glaucoma; Optic Neuritis Ear/Nose/Mouth/Throat Medical History: Negative for: Chronic sinus problems/congestion; Middle ear problems CAROLEE, CHANNELL Holly ([id number]) [id number] Page 9 of 11 Hematologic/Lymphatic Medical History: Negative for: Anemia; Hemophilia; Human Immunodeficiency Virus; Lymphedema; Sickle Cell Disease Respiratory Medical History: Positive for: Sleep Apnea Negative for: Aspiration; Asthma; Chronic Obstructive Pulmonary Disease (COPD); Pneumothorax; Tuberculosis Cardiovascular Medical History: Positive for: Hypertension Negative for: Angina; Arrhythmia; Congestive Heart Failure; Coronary Artery  Disease; Deep Vein Thrombosis; Hypotension; Myocardial Infarction; Peripheral Arterial Disease; Peripheral Venous Disease; Phlebitis; Vasculitis Past Medical History Notes: Takotsubo cardiomyopathy 2012, hyperlipidemia, Hx CVA, Tachycardia Gastrointestinal Medical History: Negative for: Cirrhosis ; Colitis; Crohns; Hepatitis Holly; Hepatitis B; Hepatitis C Endocrine Medical History: Positive for: Type II Diabetes Negative for: Type I Diabetes Time with diabetes: over 20 years Treated with: Insulin, Oral agents Blood sugar tested every day: Yes Tested : daily Genitourinary Medical History: Negative for: End Stage Renal Disease Past Medical History Notes: CKD stage 3 Immunological Medical History: Negative for: Lupus Erythematosus; Raynauds; Scleroderma Integumentary (Skin) Medical History: Negative for: History of  Burn Musculoskeletal Medical History: Positive for: Osteoarthritis; Osteomyelitis - Hx in 2007 (left 3rd toe) Negative for: Gout; Rheumatoid Arthritis Past Medical History Notes: left leg weakness Neurologic Medical History: Positive for: Neuropathy Negative for: Dementia; Quadriplegia; Paraplegia; Seizure Disorder Past Medical History Notes: CVA Oncologic Medical History: Positive for: Received Radiation - 03/2020 Negative for: Received Chemotherapy Past Medical History Notes: hx endometrial cancer, left breast CA Psychiatric Medical History: Positive for: Confinement Anxiety Negative for: Anorexia/bulimia Past Medical History NotesCOLTON, ENGDAHL ([id number]) [id number] Page 10 of 11 depression HBO Extended History Items Eyes: Cataracts Immunizations Pneumococcal Vaccine: Received Pneumococcal Vaccination: Yes Received Pneumococcal Vaccination On or After 60th Birthday: Yes Implantable Devices None Hospitalization / Surgery History Type of Hospitalization/Surgery Amputation of right great toe Tendon repair Hysterectomy Osteomyelitis left 3rd toe cervical fusion with cage left breast lumpectomy Family and Social History Cancer: Yes - Father; Diabetes: No; Heart Disease: No; Hereditary Spherocytosis: No; Hypertension: Yes - Mother,Father,Siblings; Kidney Disease: No; Lung Disease: No; Seizures: No; Stroke: Yes - Mother,Father; Thyroid Problems: No; Tuberculosis: No; Never smoker; Marital Status - Single; Alcohol Use: Never; Drug Use: No History; Caffeine Use: Rarely; Financial Concerns: No; Food, Clothing or Shelter Needs: No; Support System Lacking: No; Transportation Concerns: No Electronic Signature(s) Signed: 02/03/2022 5:05:23 PM By: Holly Holly Entered By: Holly Shan on 02/03/2022 15:14:55 -------------------------------------------------------------------------------- SuperBill Details Patient Name: Date of  Service: Holly Holly, Holly RA H Holly. 02/03/2022 Medical Record Number: [id number] Patient Account Number: [id number] Date of Birth/Sex: Treating [name]: [birth year] (71 y.o. Holly Holly, Holly Holly Primary Care Provider: Harlan Holly Other Clinician: Referring Provider: Treating Provider/Extender: [name] Weeks in Treatment: 15 Diagnosis Coding ICD-10 Codes Code Description [phone] Non-pressure chronic ulcer of other part of left foot with fat layer exposed E11.621 Type 2 diabetes mellitus with foot ulcer E11.42 Type 2 diabetes mellitus with diabetic polyneuropathy Facility Procedures : CPT4 Code: [id number] Description: 11042 - DEB SUBQ TISSUE 20 SQ CM/< ICD-10 Diagnosis Description L97.522 Non-pressure chronic ulcer of other part of left foot with fat layer exposed E11.621 Type 2 diabetes mellitus with foot ulcer Modifier: Quantity: 1 Physician Procedures : CPT4 Code Description Modifier [id number] - WC PHYS SUBQ TISS 20 SQ CM ICD-10 Diagnosis Description TASHANTI, DALPORTO Holly ([id number]) [id number] N40.768 Non-pressure chronic ulcer of other part of left foot with fat layer exposed  E11.621 Type 2 diabetes mellitus with foot ulcer Quantity: 1 7.pdf Page 11 of 11 Electronic Signature(s) Signed: 02/03/2022 5:05:23 PM By: Holly Holly Entered By: Holly Shan on 02/03/2022 15:21:14

## 2022-02-04 NOTE — Progress Notes (Signed)
Holly [name] [name] ([id number]) [id number] Page 1 of 8 Visit Report for 02/03/2022 Arrival Information Details Patient Name: Date of Service: Holly [name]. 02/03/2022 1:45 PM Medical Record Number: [id number] Patient Account Number: [id number] Date of Birth/Sex: Treating RN: [birth year] (71 y.o. F) Primary Care Holly [name]: Holly [name] Other Clinician: Referring Holly [name]: Treating Holly [name]/Extender: Holly [name] in Treatment: 15 Visit Information History Since Last Visit Added or deleted any medications: No Patient Arrived: Holly [name] Any new allergies or adverse reactions: No Arrival Time: 14:05 Had Holly [name] or experienced change in No Accompanied By: self activities of daily living that may affect Transfer Assistance: None risk of falls: Patient Identification Verified: Yes Signs or symptoms of abuse/neglect since last visito No Secondary Verification Process Completed: Yes Hospitalized since last visit: No Patient Requires Transmission-Based Precautions: No Implantable device outside of the clinic excluding No Patient Has Alerts: Yes cellular tissue based products placed in the center Patient Alerts: ABI's R=1.07 L=1.02 since last visit: Has Dressing in Place as Prescribed: Yes Pain Present Now: No Electronic Signature(s) Signed: 02/04/2022 12:40:42 PM By: Holly [name] Entered By: Holly [name] -------------------------------------------------------------------------------- Encounter Discharge Information Details Patient Name: Date of Service: Holly [name], Holly [name]. 02/03/2022 1:45 PM Medical Record Number: [id number] Patient Account Number: [id number] Date of Birth/Sex: Treating RN: 02/14/1951 (71 y.o. Tonita [name], Holly Primary Care [place] Mall: Holly [name] Other Clinician: Referring Ridhaan Dreibelbis: Treating Dale Strausser/Extender: Holly [name] in Treatment: 15 Encounter Discharge  Information Items Post Procedure Vitals Discharge Condition: Stable Temperature (F): 98.7 Ambulatory Status: Cane Pulse (bpm): 74 Discharge Destination: Home Respiratory Rate (breaths/min): 17 Transportation: Private Auto Blood Pressure (mmHg): 120/80 Accompanied By: self Schedule Follow-up Appointment: Yes Clinical Summary of Care: Patient Declined Electronic Signature(s) Signed: 02/04/2022 12:09:11 PM By: Holly Hammock RN Entered By: Holly [name] on 02/03/2022 15:08:54 Holly [name], Holly [name] ([id number]) [id number] Page 2 of 8 -------------------------------------------------------------------------------- Lower Extremity Assessment Details Patient Name: Date of Service: Holly [name]. 02/03/2022 1:45 PM Medical Record Number: [id number] Patient Account Number: [id number] Date of Birth/Sex: Treating RN: Apr 27, 1950 (71 y.o. F) Primary Care Ottilia Pippenger: Holly [name] Other Clinician: Referring Jakori Burkett: Treating Mykal Kirchman/Extender: [name] Weeks in Treatment: 15 Edema Assessment Assessed: [Left: No] [Right: No] Edema: [Left: N] [Right: o] Calf Left: Right: Point of Measurement: 34 cm From Medial Instep 46 cm Ankle Left: Right: Point of Measurement: 9 cm From Medial Instep 28 cm Electronic Signature(s) Signed: 02/04/2022 12:40:42 PM By: Holly [name] Entered By: Holly [name] on 02/03/2022 14:07:47 -------------------------------------------------------------------------------- Multi Wound Chart Details Patient Name: Date of Service: Holly [name], Holly [name]. 02/03/2022 1:45 PM Medical Record Number: [id number] Patient Account Number: [id number] Date of Birth/Sex: Treating RN: 10-04-1950 (71 y.o. F) Primary Care Mery Guadalupe: Holly [name] Other Clinician: Referring Treylon Henard: Treating Demetrice Combes/Extender: Holly [name] in Treatment: 15 Vital Signs Height(in): Pulse(bpm): 60 Weight(lbs): Blood  Pressure(mmHg): 101/65 Body Mass Index(BMI): Temperature(F): 98.2 Respiratory Rate(breaths/min): 18 [4:Photos:] [N/[name]:N/[name]] Left, Plantar Metatarsal head first N/[name] N/[name] Wound Location: Gradually Appeared N/[name] N/[name] Wounding Event: Diabetic Wound/Ulcer of the Lower N/[name] N/[name] Primary Etiology: Extremity Cataracts, Sleep Apnea, Hypertension, N/[name] N/[name] Comorbid History: Type II Diabetes, Osteoarthritis, Steib, Reyanna [name] [id number] Page 3 of 8 Osteomyelitis, Neuropathy, Received Radiation, Confinement Anxiety 10/02/2020 N/[name] N/[name] Date Acquired: 15 N/[name] N/[name] Weeks of Treatment: Open N/[name] N/[name] Wound Status: No N/[name] N/[name] Wound Recurrence: 1x0.4x0.4 N/[name] N/[name] Measurements L x W x D (cm) 0.314 N/[name] N/[name] [name] (cm) :  rea 0.126 N/[name] N/[name] Volume (cm) : 23.00% N/[name] N/[name] % Reduction in [name] rea: -2.40% N/[name] N/[name] % Reduction in Volume: Grade 1 N/[name] N/[name] Classification: Medium N/[name] N/[name] Exudate [name] mount: Serosanguineous N/[name] N/[name] Exudate Type: red, brown N/[name] N/[name] Exudate Color: Distinct, outline attached N/[name] N/[name] Wound Margin: Large (67-100%) N/[name] N/[name] Granulation [name] mount: Red N/[name] N/[name] Granulation Quality: Small (1-33%) N/[name] N/[name] Necrotic [name] mount: Fat Layer (Subcutaneous Tissue): Yes N/[name] N/[name] Exposed Structures: Fascia: No Tendon: No Muscle: No Joint: No Bone: No Medium (34-66%) N/[name] N/[name] Epithelialization: Debridement - Excisional N/[name] N/[name] Debridement: Pre-procedure Verification/Time Out 15:00 N/[name] N/[name] Taken: Lidocaine N/[name] N/[name] Pain Control: Callus, Subcutaneous, Slough N/[name] N/[name] Tissue Debrided: Skin/Subcutaneous Tissue N/[name] N/[name] Level: 0.4 N/[name] N/[name] Debridement [name] (sq cm): rea Curette N/[name] N/[name] Instrument: Minimum N/[name] N/[name] Bleeding: Pressure N/[name] N/[name] Hemostasis [name] chieved: 0 N/[name] N/[name] Procedural Pain: 0 N/[name] N/[name] Post Procedural Pain: Procedure was tolerated well N/[name] N/[name] Debridement Treatment Response: 1x0.4x0.4 N/[name] N/[name] Post Debridement Measurements L x W x D (cm) 0.126  N/[name] N/[name] Post Debridement Volume: (cm) Callus: Yes N/[name] N/[name] Periwound Skin Texture: Excoriation: No Induration: No Crepitus: No Rash: No Scarring: No Maceration: No N/[name] N/[name] Periwound Skin Moisture: Dry/Scaly: No Atrophie Blanche: No N/[name] N/[name] Periwound Skin Color: Cyanosis: No Ecchymosis: No Erythema: No Hemosiderin Staining: No Mottled: No Pallor: No Rubor: No No Abnormality N/[name] N/[name] Temperature: Debridement N/[name] N/[name] Procedures Performed: Treatment Notes Wound #4 (Metatarsal head first) Wound Laterality: Plantar, Left Cleanser Soap and Water Discharge Instruction: May shower and wash wound with dial antibacterial soap and water prior to dressing change. Wound Cleanser Discharge Instruction: Cleanse the wound with wound cleanser prior to applying [name] clean dressing using gauze sponges, not tissue or cotton balls. Peri-Wound Care Skin Prep Discharge Instruction: Use skin prep as directed Topical Primary Dressing KerraCel Ag Gelling Fiber Dressing, 4x5 in (silver alginate) Discharge Instruction: Apply silver alginate to wound bed as instructed MediHoney Gel, tube 1.5 (oz) Discharge Instruction: Apply until Regranex obtained Holly [name], Holly [name] ([id number]) [id number] Page 4 of 8 Optifoam Non-Adhesive Dressing, 4x4 in Discharge Instruction: Apply as donut around wound Secondary Dressing ALLEVYN Gentle Border, 3x3 (in/in) Discharge Instruction: Apply over primary dressing as directed. Secured With Compression Wrap Compression Stockings Environmental education officer) Signed: 02/03/2022 5:05:23 PM By: [name] DO Entered By: [name] on 02/03/2022 15:13:43 -------------------------------------------------------------------------------- Multi-Disciplinary Care Plan Details Patient Name: Date of Service: Holly [name], Holly [name]. 02/03/2022 1:45 PM Medical Record Number: [id number] Patient Account Number: [id number] Date of Birth/Sex:  Treating RN: 06/23/1950 (71 y.o. Tonita [name], Holly Primary Care Megyn Leng: Holly [name] Other Clinician: Referring Inocente Krach: Treating Latoi Giraldo/Extender: Holly [name] in Treatment: 15 Active Inactive Nutrition Nursing Diagnoses: Impaired glucose control: actual or potential Goals: Patient/caregiver verbalizes understanding of need to maintain therapeutic glucose control per primary care physician Date Initiated: 10/21/2021 Target Resolution Date: 02/19/2022 Goal Status: Active Interventions: Assess HgA1c results as ordered upon admission and as needed Provide education on elevated blood sugars and impact on wound healing Notes: Wound/Skin Impairment Nursing Diagnoses: Impaired tissue integrity Goals: Patient/caregiver will verbalize understanding of skin care regimen Date Initiated: 10/21/2021 Target Resolution Date: 02/19/2022 Goal Status: Active Ulcer/skin breakdown will have [name] volume reduction of 30% by week 4 Date Initiated: 10/21/2021 Date Inactivated: 11/29/2021 Target Resolution Date: 11/18/2021 Goal Status: Met Interventions: Assess patient/caregiver ability to obtain necessary supplies Assess patient/caregiver ability to perform ulcer/skin care regimen upon admission and as needed Assess ulceration(s) every visit Provide education  on ulcer and skin care Treatment Activities: Holly [name], Holly [name] ([id number]) [id number] Page 5 of 8 Topical wound management initiated : 10/21/2021 Notes: 11/29/21: Wound care regimen continues. Running IVR for skin sub Electronic Signature(s) Signed: 02/04/2022 12:09:11 PM By: Holly Hammock RN Entered By: Holly [name] on 02/03/2022 14:52:00 -------------------------------------------------------------------------------- Pain Assessment Details Patient Name: Date of Service: Holly [name], Holly [name]. 02/03/2022 1:45 PM Medical Record Number: [id number] Patient Account Number:  [id number] Date of Birth/Sex: Treating RN: 05/10/1950 (70 y.o. F) Primary Care Shyheim Tanney: Holly [name] Other Clinician: Referring Marybeth Dandy: Treating Spring San/Extender: Holly [name] in Treatment: 15 Active Problems Location of Pain Severity and Description of Pain Patient Has Paino No Site Locations Rate the pain. Current Pain Level: 0 Pain Management and Medication Current Pain Management: Electronic Signature(s) Signed: 02/04/2022 12:40:42 PM By: Holly [name] Entered By: Holly [name] on 02/03/2022 14:07:43 -------------------------------------------------------------------------------- Patient/Caregiver Education Details Patient Name: Date of Service: Holly [name]. 11/16/2023andnbsp1:45 PM Medical Record Number: [id number] Patient Account Number: [id number] Date of Birth/Gender: Treating RN: 1950/07/29 (71 y.o. Tonita [name], Holly Primary Care Physician: Holly [name] Other Clinician: Referring Physician: Treating Physician/Extender: Holly [name] in Treatment: [address] [place] [name] ([id number]) [id number] Page 6 of 8 Education Assessment Education Provided To: Patient Education Topics Provided Wound/Skin Impairment: Methods: Explain/Verbal Responses: State content correctly Electronic Signature(s) Signed: 02/04/2022 12:09:11 PM By: Holly Hammock RN Entered By: Holly [name] on 02/03/2022 14:52:11 -------------------------------------------------------------------------------- Wound Assessment Details Patient Name: Date of Service: Holly [name], Holly [name]. 02/03/2022 1:45 PM Medical Record Number: [id number] Patient Account Number: [id number] Date of Birth/Sex: Treating RN: 1950/05/02 (71 y.o. F) Primary Care Emberlee Sortino: Holly [name] Other Clinician: Referring Yun Gutierrez: Treating Any Mcneice/Extender: [name] Weeks in Treatment: 15 Wound Status Wound Number:  4 Primary Diabetic Wound/Ulcer of the Lower Extremity Etiology: Wound Location: Left, Plantar Metatarsal head first Wound Open Wounding Event: Gradually Appeared Status: Date Acquired: 10/02/2020 Comorbid Cataracts, Sleep Apnea, Hypertension, Type II Diabetes, Weeks Of Treatment: 15 History: Osteoarthritis, Osteomyelitis, Neuropathy, Received Radiation, Clustered Wound: No Confinement Anxiety Photos Wound Measurements Length: (cm) 1 Width: (cm) 0.4 Depth: (cm) 0.2 Area: (cm) 0.314 Volume: (cm) 0.063 % Reduction in Area: 23% % Reduction in Volume: 48.8% Epithelialization: Medium (34-66%) Tunneling: No Undermining: No Wound Description Classification: Grade 1 Wound Margin: Distinct, outline attached Exudate Amount: Medium Exudate Type: Serosanguineous Exudate Color: red, brown Foul Odor After Cleansing: No Slough/Fibrino Yes Wound Bed Granulation Amount: Large (67-100%) Exposed Structure Granulation Quality: Red Fascia Exposed: No Holly [name], Holly [name] ([id number]) [id number] Page 7 of 8 Necrotic Amount: Small (1-33%) Fat Layer (Subcutaneous Tissue) Exposed: Yes Tendon Exposed: No Muscle Exposed: No Joint Exposed: No Bone Exposed: No Periwound Skin Texture Texture Color No Abnormalities Noted: No No Abnormalities Noted: No Callus: Yes Atrophie Blanche: No Crepitus: No Cyanosis: No Excoriation: No Ecchymosis: No Induration: No Erythema: No Rash: No Hemosiderin Staining: No Scarring: No Mottled: No Pallor: No Moisture Rubor: No No Abnormalities Noted: No Dry / Scaly: No Temperature / Pain Maceration: No Temperature: No Abnormality Treatment Notes Wound #4 (Metatarsal head first) Wound Laterality: Plantar, Left Cleanser Soap and Water Discharge Instruction: May shower and wash wound with dial antibacterial soap and water prior to dressing change. Wound Cleanser Discharge Instruction: Cleanse the wound with wound cleanser prior to  applying [name] clean dressing using gauze sponges, not tissue or cotton balls. Peri-Wound Care Skin Prep Discharge Instruction: Use skin prep as directed Topical Primary Dressing KerraCel Ag Gelling Fiber Dressing,  4x5 in (silver alginate) Discharge Instruction: Apply silver alginate to wound bed as instructed MediHoney Gel, tube 1.5 (oz) Discharge Instruction: Apply until Regranex obtained Optifoam Non-Adhesive Dressing, 4x4 in Discharge Instruction: Apply as donut around wound Secondary Dressing ALLEVYN Gentle Border, 3x3 (in/in) Discharge Instruction: Apply over primary dressing as directed. Secured With Compression Wrap Compression Stockings Add-Ons Electronic Signature(s) Signed: 02/03/2022 5:05:23 PM By: [name] DO Entered By: [name] on 02/03/2022 15:16:42 -------------------------------------------------------------------------------- Vitals Details Patient Name: Date of Service: Holly [name], Holly [name]. 02/03/2022 1:45 PM Medical Record Number: [id number] Patient Account Number: [id number] Date of Birth/Sex: Treating RN: [birth year] (71 y.o. F) Primary Care Kelcey Wickstrom: Holly [name] Other Clinician: Referring Urania Pearlman: Treating Tajon Moring/Extender: [name] Holly [name], Holly [name] ([id number]) [id number] Page 8 of 8 Weeks in Treatment: 15 Vital Signs Time Taken: 14:06 Temperature (F): 98.2 Pulse (bpm): 90 Respiratory Rate (breaths/min): 18 Blood Pressure (mmHg): 101/65 Reference Range: 80 - 120 mg / dl Electronic Signature(s) Signed: 02/04/2022 12:40:42 PM By: Holly [name] Entered By: Holly [name] on 02/03/2022 14:07:37

## 2022-02-08 DIAGNOSIS — E785 Hyperlipidemia, unspecified: Secondary | ICD-10-CM | POA: Diagnosis not present

## 2022-02-08 DIAGNOSIS — N183 Chronic kidney disease, stage 3 unspecified: Secondary | ICD-10-CM | POA: Diagnosis not present

## 2022-02-08 DIAGNOSIS — E1121 Type 2 diabetes mellitus with diabetic nephropathy: Secondary | ICD-10-CM | POA: Diagnosis not present

## 2022-02-08 DIAGNOSIS — I129 Hypertensive chronic kidney disease with stage 1 through stage 4 chronic kidney disease, or unspecified chronic kidney disease: Secondary | ICD-10-CM | POA: Diagnosis not present

## 2022-02-08 DIAGNOSIS — F3341 Major depressive disorder, recurrent, in partial remission: Secondary | ICD-10-CM | POA: Diagnosis not present

## 2022-02-17 ENCOUNTER — Encounter (HOSPITAL_BASED_OUTPATIENT_CLINIC_OR_DEPARTMENT_OTHER): Payer: PPO | Admitting: Internal Medicine

## 2022-02-17 DIAGNOSIS — L97522 Non-pressure chronic ulcer of other part of left foot with fat layer exposed: Secondary | ICD-10-CM

## 2022-02-17 DIAGNOSIS — E11621 Type 2 diabetes mellitus with foot ulcer: Secondary | ICD-10-CM

## 2022-02-18 DIAGNOSIS — L97522 Non-pressure chronic ulcer of other part of left foot with fat layer exposed: Secondary | ICD-10-CM | POA: Diagnosis not present

## 2022-02-18 DIAGNOSIS — H43813 Vitreous degeneration, bilateral: Secondary | ICD-10-CM | POA: Diagnosis not present

## 2022-02-18 DIAGNOSIS — E1165 Type 2 diabetes mellitus with hyperglycemia: Secondary | ICD-10-CM | POA: Diagnosis not present

## 2022-02-18 DIAGNOSIS — H34831 Tributary (branch) retinal vein occlusion, right eye, with macular edema: Secondary | ICD-10-CM | POA: Diagnosis not present

## 2022-02-18 NOTE — Progress Notes (Signed)
CIARAH, PEACE A ([id number]) [id number] Page 1 of 8 Visit Report for 02/17/2022 Arrival Information Details Patient Name: Date of Service: [name] 02/17/2022 1:45 PM Medical Record Number: [id number] Patient Account Number: [id number] Date of Birth/Sex: Treating RN: Oct 01, 1950 (71 y.o. F) Primary Care Kammi Hechler: [name] Other Clinician: Referring Valeriano Bain: Treating Javanni Maring/Extender: [name] in Treatment: 12 Visit Information History Since Last Visit Added or deleted any medications: No Patient Arrived: Cane Any new allergies or adverse reactions: No Arrival Time: 14:47 Had a fall or experienced change in No Accompanied By: self activities of daily living that may affect Transfer Assistance: None risk of falls: Patient Identification Verified: Yes Signs or symptoms of abuse/neglect since last visito No Secondary Verification Process Completed: Yes Hospitalized since last visit: No Patient Requires Transmission-Based Precautions: No Implantable device outside of the clinic excluding No Patient Has Alerts: Yes cellular tissue based products placed in the center Patient Alerts: ABI's R=1.07 L=1.02 since last visit: Has Dressing in Place as Prescribed: Yes Pain Present Now: No Electronic Signature(s) Signed: 02/18/2022 1:02:55 PM By: Erenest Blank Entered By: Erenest Blank on 02/17/2022 14:48:29 -------------------------------------------------------------------------------- Encounter Discharge Information Details Patient Name: Date of Service: [name], [name] 02/17/2022 1:45 PM Medical Record Number: [id number] Patient Account Number: [id number] Date of Birth/Sex: Treating RN: 09/21/1950 (71 y.o. Tonita [name], Holly Primary Care Muriel Hannold: [name] Other Clinician: Referring Mackenna Kamer: Treating Denaly Gatling/Extender: [name] in Treatment: 17 Encounter Discharge  Information Items Post Procedure Vitals Discharge Condition: Stable Temperature (F): 98.7 Ambulatory Status: Ambulatory Pulse (bpm): 74 Discharge Destination: Home Respiratory Rate (breaths/min): 17 Transportation: Private Auto Blood Pressure (mmHg): 134/74 Accompanied By: self Schedule Follow-up Appointment: Yes Clinical Summary of Care: Patient Declined Electronic Signature(s) Signed: 02/17/2022 5:26:06 PM By: Rhae Hammock RN Entered By: Rhae Hammock on 02/17/2022 15:10:47 Iverson, Noami A ([id number]) [id number] Page 2 of 8 -------------------------------------------------------------------------------- Lower Extremity Assessment Details Patient Name: Date of Service: [name] A. 02/17/2022 1:45 PM Medical Record Number: [id number] Patient Account Number: [id number] Date of Birth/Sex: Treating RN: [birth year] (71 y.o. F) Primary Care Jasara Corrigan: [name] Other Clinician: Referring Rayshard Schirtzinger: Treating Ziyon Cedotal/Extender: [name] Weeks in Treatment: 17 Edema Assessment Assessed: [Left: Yes] [Right: No] Edema: [Left: N] [Right: o] Calf Left: Right: Point of Measurement: 34 cm From Medial Instep 44 cm Ankle Left: Right: Point of Measurement: 9 cm From Medial Instep 27 cm Vascular Assessment Pulses: Dorsalis Pedis Palpable: [Left:Yes] Electronic Signature(s) Signed: 02/18/2022 1:02:55 PM By: Erenest Blank Entered By: Erenest Blank on 02/17/2022 14:50:38 -------------------------------------------------------------------------------- Multi Wound Chart Details Patient Name: Date of Service: [name], [name] RA H A. 02/17/2022 1:45 PM Medical Record Number: [id number] Patient Account Number: [id number] Date of Birth/Sex: Treating RN: 1950/10/06 (71 y.o. F) Primary Care Jaklyn Alen: [name] Other Clinician: Referring Cutler Sunday: Treating Chaya Dehaan/Extender: [name] in  Treatment: 17 Vital Signs Height(in): Pulse(bpm): 73 Weight(lbs): Blood Pressure(mmHg): 116/71 Body Mass Index(BMI): Temperature(F): 98 Respiratory Rate(breaths/min): 20 [4:Photos:] [N/A:N/A] Left, Plantar Metatarsal head first N/A N/A Wound Location: Gradually Appeared N/A N/A Wounding Event: Diabetic Wound/Ulcer of the Lower N/A N/A Primary Etiology: Extremity Cataracts, Sleep Apnea, Hypertension, N/A N/A Comorbid History: Type II Diabetes, Osteoarthritis, Osteomyelitis, Neuropathy, Received Radiation, Confinement Anxiety 10/02/2020 N/A N/A Date Acquired: 17 N/A N/A Weeks of Treatment: Open N/A N/A Wound Status: No N/A N/A Wound Recurrence: 0.8x0.2x0.2 N/A N/A Measurements L x W x D (cm) 0.126 N/A N/A A (cm) : rea  0.025 N/A N/A Volume (cm) : 69.10% N/A N/A % Reduction in A rea: 79.70% N/A N/A % Reduction in Volume: Grade 1 N/A N/A Classification: Medium N/A N/A Exudate A mount: Serosanguineous N/A N/A Exudate Type: red, brown N/A N/A Exudate Color: Distinct, outline attached N/A N/A Wound Margin: Large (67-100%) N/A N/A Granulation A mount: Pink, Pale N/A N/A Granulation Quality: None Present (0%) N/A N/A Necrotic A mount: Fat Layer (Subcutaneous Tissue): Yes N/A N/A Exposed Structures: Fascia: No Tendon: No Muscle: No Joint: No Bone: No Large (67-100%) N/A N/A Epithelialization: Debridement - Excisional N/A N/A Debridement: Pre-procedure Verification/Time Out 15:05 N/A N/A Taken: Lidocaine N/A N/A Pain Control: Callus, Subcutaneous, Slough N/A N/A Tissue Debrided: Skin/Subcutaneous Tissue N/A N/A Level: 0.16 N/A N/A Debridement A (sq cm): rea Curette N/A N/A Instrument: Minimum N/A N/A Bleeding: Pressure N/A N/A Hemostasis A chieved: 0 N/A N/A Procedural Pain: 0 N/A N/A Post Procedural Pain: Procedure was tolerated well N/A N/A Debridement Treatment Response: 0.8x0.2x0.2 N/A N/A Post Debridement Measurements L x W x D  (cm) 0.025 N/A N/A Post Debridement Volume: (cm) Callus: Yes N/A N/A Periwound Skin Texture: Excoriation: No Induration: No Crepitus: No Rash: No Scarring: No Maceration: No N/A N/A Periwound Skin Moisture: Dry/Scaly: No Atrophie Blanche: No N/A N/A Periwound Skin Color: Cyanosis: No Ecchymosis: No Erythema: No Hemosiderin Staining: No Mottled: No Pallor: No Rubor: No No Abnormality N/A N/A Temperature: Debridement N/A N/A Procedures Performed: Treatment Notes Wound #4 (Metatarsal head first) Wound Laterality: Plantar, Left Cleanser Soap and Water Discharge Instruction: May shower and wash wound with dial antibacterial soap and water prior to dressing change. Wound Cleanser Discharge Instruction: Cleanse the wound with wound cleanser prior to applying a clean dressing using gauze sponges, not tissue or cotton balls. Peri-Wound Care Skin Prep Discharge Instruction: Use skin prep as directed Topical JOLANA, RUNKLES A ([id number]) [id number] Page 4 of 8 Primary Dressing MediHoney Gel, tube 1.5 (oz) Discharge Instruction: Apply until Regranex obtained Optifoam Non-Adhesive Dressing, 4x4 in Discharge Instruction: Apply as donut around wound KerraCel Ag Gelling Fiber Dressing, 4x5 in (silver alginate) Discharge Instruction: Apply silver alginate to wound bed as instructed Secondary Dressing Secured With Compression Wrap Compression Stockings Add-Ons Electronic Signature(s) Signed: 02/17/2022 4:36:48 PM By: [name] DO Entered By: [name] on 02/17/2022 15:52:55 -------------------------------------------------------------------------------- [place] Details Patient Name: Date of Service: [name], [name] RA H A. 02/17/2022 1:45 PM Medical Record Number: [id number] Patient Account Number: [id number] Date of Birth/Sex: Treating RN: 05/26/1950 (71 y.o. Tonita [name], Holly Primary Care Shawnay Bramel: [name]  Other Clinician: Referring Sena Hoopingarner: Treating Urijah Raynor/Extender: [name] in Treatment: 17 Active Inactive Nutrition Nursing Diagnoses: Impaired glucose control: actual or potential Goals: Patient/caregiver verbalizes understanding of need to maintain therapeutic glucose control per primary care physician Date Initiated: 10/21/2021 Target Resolution Date: 02/19/2022 Goal Status: Active Interventions: Assess HgA1c results as ordered upon admission and as needed Provide education on elevated blood sugars and impact on wound healing Notes: Wound/Skin Impairment Nursing Diagnoses: Impaired tissue integrity Goals: Patient/caregiver will verbalize understanding of skin care regimen Date Initiated: 10/21/2021 Target Resolution Date: 02/19/2022 Goal Status: Active Ulcer/skin breakdown will have a volume reduction of 30% by week 4 Date Initiated: 10/21/2021 Date Inactivated: 11/29/2021 Target Resolution Date: 11/18/2021 Goal Status: Met Interventions: Assess patient/caregiver ability to obtain necessary supplies Assess patient/caregiver ability to perform ulcer/skin care regimen upon admission and as needed KENDY, HASTON A ([id number]) [id number] Page 5 of 8 Assess ulceration(s) every visit Provide education on ulcer and  skin care Treatment Activities: Topical wound management initiated : 10/21/2021 Notes: 11/29/21: Wound care regimen continues. Running IVR for skin sub Electronic Signature(s) Signed: 02/17/2022 5:26:06 PM By: Rhae Hammock RN Entered By: Rhae Hammock on 02/17/2022 15:09:38 -------------------------------------------------------------------------------- Pain Assessment Details Patient Name: Date of Service: [name], [name] RA H A. 02/17/2022 1:45 PM Medical Record Number: [id number] Patient Account Number: [id number] Date of Birth/Sex: Treating RN: 02/01/1951 (71 y.o. F) Primary Care Delaina Fetsch: [name]  Other Clinician: Referring Tavaris Eudy: Treating Kenn Rekowski/Extender: [name] in Treatment: 17 Active Problems Location of Pain Severity and Description of Pain Patient Has Paino No Site Locations Pain Management and Medication Current Pain Management: Electronic Signature(s) Signed: 02/18/2022 1:02:55 PM By: Erenest Blank Entered By: Erenest Blank on 02/17/2022 14:51:47 -------------------------------------------------------------------------------- Patient/Caregiver Education Details Patient Name: Date of Service: [name] A. 11/30/2023andnbsp1:45 PM Medical Record Number: [id number] Patient Account Number: [id number] Date of Birth/Gender: Treating RN: November 28, 1950 (71 y.o. [name] Primary Care Physician: [name] Other Clinician: Jule Ser ([id number]) [id number] Page 6 of 8 Referring Physician: Treating Physician/Extender: [name] in Treatment: 10 Education Assessment Education Provided To: Patient Education Topics Provided Wound/Skin Impairment: Methods: Explain/Verbal Responses: Reinforcements needed, State content correctly Electronic Signature(s) Signed: 02/17/2022 5:26:06 PM By: Rhae Hammock RN Entered By: Rhae Hammock on 02/17/2022 15:09:51 -------------------------------------------------------------------------------- Wound Assessment Details Patient Name: Date of Service: [name], [name] RA H A. 02/17/2022 1:45 PM Medical Record Number: [id number] Patient Account Number: [id number] Date of Birth/Sex: Treating RN: 1950-12-23 (71 y.o. F) Primary Care Jasenia Weilbacher: [name] Other Clinician: Referring Kyree Adriano: Treating Siddhi Dornbush/Extender: [name] Weeks in Treatment: 17 Wound Status Wound Number: 4 Primary Diabetic Wound/Ulcer of the Lower Extremity Etiology: Wound Location: Left, Plantar Metatarsal head  first Wound Open Wounding Event: Gradually Appeared Status: Date Acquired: 10/02/2020 Comorbid Cataracts, Sleep Apnea, Hypertension, Type II Diabetes, Weeks Of Treatment: 17 History: Osteoarthritis, Osteomyelitis, Neuropathy, Received Radiation, Clustered Wound: No Confinement Anxiety Photos Wound Measurements Length: (cm) 0.8 Width: (cm) 0.2 Depth: (cm) 0.2 Area: (cm) 0.126 Volume: (cm) 0.025 % Reduction in Area: 69.1% % Reduction in Volume: 79.7% Epithelialization: Large (67-100%) Tunneling: No Undermining: No Wound Description Classification: Grade 1 Wound Margin: Distinct, outline attached Exudate Amount: Medium Exudate Type: Serosanguineous Exudate Color: red, brown Cartmell, Miyako A ([id number]) Wound Bed Granulation Amount: Large (67-100%) Granulation Quality: Pink, Pale Necrotic Amount: None Present (0% Foul Odor After Cleansing: No Slough/Fibrino No [id number] Page 7 of 8 Exposed Structure Fascia Exposed: No ) Fat Layer (Subcutaneous Tissue) Exposed: Yes Tendon Exposed: No Muscle Exposed: No Joint Exposed: No Bone Exposed: No Periwound Skin Texture Texture Color No Abnormalities Noted: No No Abnormalities Noted: No Callus: Yes Atrophie Blanche: No Crepitus: No Cyanosis: No Excoriation: No Ecchymosis: No Induration: No Erythema: No Rash: No Hemosiderin Staining: No Scarring: No Mottled: No Pallor: No Moisture Rubor: No No Abnormalities Noted: No Dry / Scaly: No Temperature / Pain Maceration: No Temperature: No Abnormality Treatment Notes Wound #4 (Metatarsal head first) Wound Laterality: Plantar, Left Cleanser Soap and Water Discharge Instruction: May shower and wash wound with dial antibacterial soap and water prior to dressing change. Wound Cleanser Discharge Instruction: Cleanse the wound with wound cleanser prior to applying a clean dressing using gauze sponges, not tissue or cotton balls. Peri-Wound  Care Skin Prep Discharge Instruction: Use skin prep as directed Topical Primary Dressing MediHoney Gel, tube 1.5 (oz) Discharge Instruction: Apply until Regranex obtained Optifoam Non-Adhesive Dressing, 4x4 in Discharge Instruction: Apply  as donut around wound KerraCel Ag Gelling Fiber Dressing, 4x5 in (silver alginate) Discharge Instruction: Apply silver alginate to wound bed as instructed Secondary Dressing Secured With Compression Wrap Compression Stockings Add-Ons Electronic Signature(s) Signed: 02/18/2022 1:02:55 PM By: Erenest Blank Entered By: Erenest Blank on 02/17/2022 14:52:37 -------------------------------------------------------------------------------- Vitals Details Patient Name: Date of Service: [name], [name] 02/17/2022 1:45 PM Medical Record Number: [id number] Patient Account Number: [id number] Date of Birth/Sex: Treating RN: [birth year] (71 y.o. F) Primary Care Burnett Lieber: [name] Other Clinician: Jule Ser ([id number]) [id number] Page 8 of 8 Referring Sheryle Vice: Treating Shemeka Wardle/Extender: [name] Weeks in Treatment: 17 Vital Signs Time Taken: 14:48 Temperature (F): 98 Pulse (bpm): 64 Respiratory Rate (breaths/min): 20 Blood Pressure (mmHg): 116/71 Reference Range: 80 - 120 mg / dl Electronic Signature(s) Signed: 02/18/2022 1:02:55 PM By: Erenest Blank Entered By: Erenest Blank on 02/17/2022 14:50:47

## 2022-02-18 NOTE — Progress Notes (Signed)
[name] ([id number]) [id number] Page 1 of 11 Visit Report for 02/17/2022 Chief Complaint Document Details Patient Name: Date of Service: [name] 02/17/2022 1:45 PM Medical Record Number: [id number] Patient Account Number: [id number] Date of Birth/Sex: Treating RN: 1951/02/26 (71 y.o. F) Primary Care Provider: [name] Other Clinician: Referring Provider: Treating Provider/Extender: [name] in Treatment: 17 Information Obtained from: Patient Chief Complaint Left plantar ulcer 11/12/2019; patient is here referred by podiatry for review of [name] wound on the left first plantar metatarsal head 03/30/2021; right second toe wound s/p Trauma 10/21/2021; First left metatarsal head wound Electronic Signature(s) Signed: 02/17/2022 4:36:48 PM By: [name] DO Entered By: [name] on 02/17/2022 15:53:20 -------------------------------------------------------------------------------- Debridement Details Patient Name: Date of Service: [name], Holly RA H [name]. 02/17/2022 1:45 PM Medical Record Number: [id number] Patient Account Number: [id number] Date of Birth/Sex: Treating RN: Jul 20, 1950 (71 y.o. Tonita Phoenix, [name] Primary Care Provider: [name] Other Clinician: Referring Provider: Treating Provider/Extender: [name] in Treatment: 17 Debridement Performed for Assessment: Wound #4 Left,Plantar Metatarsal head first Performed By: Physician [name], DO Debridement Type: Debridement Severity of Tissue Pre Debridement: Fat layer exposed Level of Consciousness (Pre-procedure): Awake and Alert Pre-procedure Verification/Time Out Yes - 15:05 Taken: Start Time: 15:05 Pain Control: Lidocaine T Area Debrided (L x W): otal 0.8 (cm) x 0.2 (cm) = 0.16 (cm) Tissue and other material debrided: Viable, Non-Viable, Callus, Slough, Subcutaneous, Slough Level:  Skin/Subcutaneous Tissue Debridement Description: Excisional Instrument: Curette Bleeding: Minimum Hemostasis Achieved: Pressure End Time: 15:05 Procedural Pain: 0 Post Procedural Pain: 0 Response to Treatment: Procedure was tolerated well Level of Consciousness (Post- Awake and Alert procedure): Post Debridement Measurements of Total Wound Length: (cm) 0.8 Width: (cm) 0.2 Depth: (cm) 0.2 Volume: (cm) 0.025 [name], [name] ([id number]) [id number] Page 2 of 11 Character of Wound/Ulcer Post Debridement: Improved Severity of Tissue Post Debridement: Fat layer exposed Post Procedure Diagnosis Same as Pre-procedure Electronic Signature(s) Signed: 02/17/2022 4:36:48 PM By: [name] DO Signed: 02/17/2022 5:26:06 PM By: Holly Hammock RN Entered By: [name] on 02/17/2022 15:08:48 -------------------------------------------------------------------------------- HPI Details Patient Name: Date of Service: [name], Holly RA H [name]. 02/17/2022 1:45 PM Medical Record Number: [id number] Patient Account Number: [id number] Date of Birth/Sex: Treating RN: [birth year] (71 y.o. F) Primary Care Provider: [name] Other Clinician: Referring Provider: Treating Provider/Extender: [name] in Treatment: 17 History of Present Illness HPI Description: 05/10/17 patient presents today for initial evaluation and our clinic concerning issues that she has been having with an ulceration on the plantar aspect of the left first metatarsal head. She has been seen [name] for this since August 2018. Subsequently he has recommended bunion surgery as that seems to be the culprit for why there is pressure and friction occurring at the site causing the callous and subsequently the wound. 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 With that being said I do have notes from several of his visits one of which does include an x-ray documentation stating that she had [name] negative x-ray. There was obviously the bunion the notes I have extended from 11 deformity. With that being said she has not had an MRI fortunately this wound does not probe to bone. The notes I have extended from February 08, 2017 through April 14, 2017. During that course she was also placed on antibiotics to help with an infection two weeks ago and this was doxycycline. Fortunately that seems to have completely resolved any infection issues that she may have had. Unfortunately this is an ulcer that she has actually been dealing with since April 2018. She just initially was trying to treat it and care for it on her own. Her most recent hemoglobin A1c was 5.8, she is [name] non-smoker, she does have [name] right first toe amputation and [name] left third toe amputation. Fortunately she's not having any discomfort at the site. 05/17/17 on evaluation today patient's ulcer on the plantar aspect of the first metatarsal region appears to be close at this point. There does not appear to be any evidence of ulcer opening. This is on initial inspection. She has not really had any discomfort but honestly she really was not experiencing [name] lot of pain even before. There is no evidence of infection or fluctuation under the callous which is present at this point. ADMISSION 11/12/2019 This is [name] 71 year old woman that we previously had in this clinic in 2017 for two visits with [name] wound I think roughly in the same area. At that point she had [name] bunion in the left. She has apparently had surgery on this since then. Looking through care everywhere we can see that she has had follow-up with podiatry from 06/12/2019 through 10/28/2019 for [name] wound on the plantar aspect of her left first MTP. She states when this started she was having severe knee pain on  the right which forced her to alter her gait. She has recently received doxycycline which she is finished and Bactroban. She is back to using Medihoney on the wound. She is offloading with the Pegasys shoe. Past medical history includes type 2 diabetes with [name] recent hemoglobin A1c of 6.8, severe type II diabetic neuropathy, history of [name] CVA, stage III chronic renal failure hypertension, depression, of bilateral previous bunion surgery, right first toe amputation and [name] partial left third toe amputation for osteomyelitis. The patient states that she recently had work to do in her mother's house in [place] and so she was on her feet quite [name] lot which delayed the healing. ABI in our clinic was 1.1 on the left 8/31; plantar aspect of her left first metatarsal head. Using silver collagen. This is probably [name] surgical wound 9/14; this is [name] patient who had [name] wound on the plantar aspect of her left first metatarsal head in the setting of [name] significant bunion deformity. We use cervical collagen and [name] offloading shoe. She has significant diabetic neuropathy probably some degree of [name] Charcot deformity in her feet. This is closed over today. Readmission 03/30/2021 Ms. Nohely Whitehorn is [name] 71 year old female with [name] past medical history of insulin-dependent controlled type 2 diabetes s/p right great toe amputation, right knee osteoarthritis and hypertension that presents to the clinic for [name] second right toe wound. She states she was getting out of the bathtub when her foot got caught the tub and the skin to her second toe was split open. She noticed this after the bath when she saw blood on the floor. She has neuropathy in her feet. She visited the ED following the event on 03/26/2021. They obtained x-rays that showed [name] fracture to the fifth toe but no fracture to the second toe. She was started on doxycycline. She currently denies signs of infection. She has been keeping the area covered with [name] Band-Aid. Readmission  10/21/2021 Ms. Hadiyah Nolden is [name] 71 year old female with [name] past medical history of insulin dependent controlled type 2 diabetes complicated by peripheral neuropathy that presents to the clinic for [name] left met head wound. She has been following with Dr. Earleen Newport, podiatry for over [name] year for this issue. She has tried collagen, mupirocin ointment and Medihoney. She is at high fall risk and cannot tolerate [name] total contact cast. She has orthotics with specialized inserts for her current issue. She currently denies signs of infection. 8/10; patient presents for follow-up. Unfortunately Regranex was unaffordable at $1200. She has been using Medihoney to the wound bed. She reports aggressively offloading the area. She has offloading inserts. She denies signs of infection. 8/22; patient presents for follow-up. She has been using Medihoney to the wound bed with improvement in wound healing. She has no issues or complaints today. She will be out of town starting next week for the next 2 weeks. AUTUMM, HATTERY [name] ([id number]) [id number] Page 3 of 11 9/11; patient presents for follow-up. She has been using Medihoney to the wound bed. She went on vacation and was last seen 3 weeks ago. She states she had tried to aggressively offload the wound bed. 9/26; patient presents for follow-up. She has been using Medihoney to the wound bed. She has no issues or complaints today. 10/13; patient presents for follow-up. She has been using Medihoney to the wound bed. She has no issues or complaints today. We rediscussed the possibility of [name] total contact cast but she does not feel comfortable as she is [name] high fall risk and lives alone. 10/26; patient presents for follow-up. Has been using Medihoney to the wound bed. She has been approved for Grafix however has [name] 20% co-pay. Patient declined proceeding with this due to financial cost. Patient has no issues or complaints today. 11/16; patient presents for  follow-up. She has been using Medihoney to the wound bed. She has been using her diabetic shoes with an offloading foam donut. She declines total contact cast placement today. 11/30; patient presents for follow-up. She has been using Medihoney to the wound bed. She did not receive silver alginate. She has been using the Pegasys with surgical shoe. There has been improvement in wound healing. Electronic Signature(s) Signed: 02/17/2022 4:36:48 PM By: [name] DO Entered By: [name] on 02/17/2022 15:54:16 -------------------------------------------------------------------------------- Physical Exam Details Patient Name: Date of Service: [name], [name] RA H [name]. 02/17/2022 1:45 PM Medical Record Number: [id number] Patient Account Number: [id number] Date of Birth/Sex: Treating RN: 05/18/1950 (71 y.o. F) Primary Care Provider: [name] Other Clinician: Referring Provider: Treating Provider/Extender: [name] Weeks in Treatment: 17 Constitutional respirations regular, non-labored and within target range for patient.. Cardiovascular 2+ dorsalis pedis/posterior tibialis pulses. Psychiatric pleasant and cooperative. Notes Left foot: T the left first metatarsal there is an open wound with granulation tissue, nonviable tissue and callus. Postdebridement there is healthy granulation o tissue with no undermining. No signs of surrounding infection including increased warmth, erythema or purulent drainage. Electronic Signature(s) Signed: 02/17/2022 4:36:48 PM By: [name] DO Entered By: [name] on 02/17/2022 15:54:46 -------------------------------------------------------------------------------- Physician Orders Details Patient Name: Date of Service: [name], Holly RA H [name]. 02/17/2022 1:45 PM Medical Record Number: [id number] Patient Account Number: [id number] Date of Birth/Sex: Treating RN: 1950/04/13 (71 y.o. Tonita Phoenix, [name] Primary  Care Provider: [name] Other Clinician: Referring Provider: Treating Provider/Extender: [name] in Treatment: [phone] Verbal / Phone Orders: No Diagnosis Coding Dier, [facility]  [name] ([id number]) [id number] Page 4 of 11 Follow-up Appointments ppointment in 1 week. - w/ Dr. Heber [place] on Friday Return [name] Other: - Prism=Supplies Anesthetic (In clinic) Topical Lidocaine 5% applied to wound bed (In clinic) Topical Lidocaine 4% applied to wound bed Cellular or Tissue Based Products Cellular or Tissue Based Product Type: - IVR for Grafix IVR for [employer]=$225 Copay Bathing/ Shower/ Hygiene May shower and wash wound with soap and water. Edema Control - Lymphedema / SCD / Other Avoid standing for long periods of time. Off-Loading Other: - Diabetic Shoes Relieve pressure/time standing of feet ***Use surgical shoe with peg assist insert with cutout for wound for offloading*** Additional Orders / Instructions Follow Nutritious Diet - Monitor/Control Blood Sugars Non Wound Condition Other Non Wound Condition Orders/Instructions: - Apply corn pad or gauze between great toe and 2nd toe. Wound Treatment Wound #4 - Metatarsal head first Wound Laterality: Plantar, Left Cleanser: Soap and Water 1 x Per Day/30 Days Discharge Instructions: May shower and wash wound with dial antibacterial soap and water prior to dressing change. Cleanser: Wound Cleanser (DME) (Generic) 1 x Per Day/30 Days Discharge Instructions: Cleanse the wound with wound cleanser prior to applying [name] clean dressing using gauze sponges, not tissue or cotton balls. Peri-Wound Care: Skin Prep (DME) (Generic) 1 x Per Day/30 Days Discharge Instructions: Use skin prep as directed Prim Dressing: MediHoney Gel, tube 1.5 (oz) 1 x Per Day/30 Days ary Discharge Instructions: Apply until Regranex obtained Prim Dressing: Optifoam Non-Adhesive Dressing, 4x4 in (DME) (Generic) 1 x Per  Day/30 Days ary Discharge Instructions: Apply as donut around wound Prim Dressing: KerraCel Ag Gelling Fiber Dressing, 4x5 in (silver alginate) (DME) (Generic) 1 x Per Day/30 Days ary Discharge Instructions: Apply silver alginate to wound bed as instructed Secondary Dressing: ALLEVYN Gentle Border, 3x3 (in/in) (DME) (Generic) 1 x Per Day/30 Days Discharge Instructions: Apply over primary dressing as directed. Electronic Signature(s) Signed: 02/17/2022 4:36:48 PM By: [name] DO Signed: 02/17/2022 5:26:06 PM By: Holly Hammock RN Entered By: [name] on 02/17/2022 16:30:57 -------------------------------------------------------------------------------- Problem List Details Patient Name: Date of Service: [name], [name] RA H [name]. 02/17/2022 1:45 PM Medical Record Number: [id number] Patient Account Number: [id number] Date of Birth/Sex: Treating RN: 27-Oct-1950 (71 y.o. F) Primary Care Provider: [name] Other Clinician: Referring Provider: Treating Provider/Extender: [name] in Treatment: [address] [place] [name] ([id number]) [id number] Page 5 of 11 Active Problems ICD-10 Encounter Code Description Active Date MDM Diagnosis L97.522 Non-pressure chronic ulcer of other part of left foot with fat layer exposed 10/21/2021 No Yes E11.621 Type 2 diabetes mellitus with foot ulcer 10/21/2021 No Yes E11.42 Type 2 diabetes mellitus with diabetic polyneuropathy 10/21/2021 No Yes Inactive Problems Resolved Problems Electronic Signature(s) Signed: 02/17/2022 4:36:48 PM By: [name] DO Entered By: [name] on 02/17/2022 15:52:50 -------------------------------------------------------------------------------- Progress Note Details Patient Name: Date of Service: [name], Holly RA H [name]. 02/17/2022 1:45 PM Medical Record Number: [id number] Patient Account Number: [id number] Date of Birth/Sex: Treating RN: 07-30-1950  (71 y.o. F) Primary Care Provider: [name] Other Clinician: Referring Provider: Treating Provider/Extender: [name] in Treatment: 17 Subjective Chief Complaint Information obtained from Patient Left plantar ulcer 11/12/2019; patient is here referred by podiatry for review of [name] wound on the left first plantar metatarsal head 03/30/2021; right second toe wound s/p Trauma 10/21/2021; First left metatarsal head wound History of Present Illness (HPI) 05/10/17 patient presents today for initial evaluation and our clinic concerning issues that she has been  having with an ulceration on the plantar aspect of the left first metatarsal head. She has been seen [name] for this since August 2018. Subsequently he has recommended bunion surgery as that seems to be the culprit for why there is pressure and friction occurring at the site causing the callous and subsequently the wound. 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 With that being said I do have notes from several of his visits one of which does include an x-ray documentation stating that she had [name] negative x-ray. There was obviously the bunion the notes I have extended from 11 deformity. With that being said she has not had an MRI fortunately this wound does not probe to bone. The notes I have extended from February 08, 2017 through April 14, 2017. During that course she was also placed on antibiotics to help with an infection two weeks ago and this was doxycycline. Fortunately that seems to have completely resolved any infection issues that she may have had. Unfortunately this is an ulcer that she has actually been dealing with since April 2018. She just initially was trying to treat it and care for it on her own. Her most recent hemoglobin A1c was 5.8, she is  [name] non-smoker, she does have [name] right first toe amputation and [name] left third toe amputation. Fortunately she's not having any discomfort at the site. 05/17/17 on evaluation today patient's ulcer on the plantar aspect of the first metatarsal region appears to be close at this point. There does not appear to be any evidence of ulcer opening. This is on initial inspection. She has not really had any discomfort but honestly she really was not experiencing [name] lot of pain even before. There is no evidence of infection or fluctuation under the callous which is present at this point. ADMISSION 11/12/2019 This is [name] 71 year old woman that we previously had in this clinic in 2017 for two visits with [name] wound I think roughly in the same area. At that point she had [name] bunion in the left. She has apparently had surgery on this since then. Looking through care everywhere we can see that she has had follow-up with podiatry from 06/12/2019 through 10/28/2019 for [name] wound on the plantar aspect of her left first MTP. She states when this started she was having severe knee pain on the right which forced her to alter her gait. She has recently received doxycycline which she is finished and Bactroban. She is back to using Medihoney on the wound. She is offloading with the Pegasys shoe. Past medical history includes type 2 diabetes with [name] recent hemoglobin A1c of 6.8, severe type II diabetic neuropathy, history of [name] CVA, stage III chronic renal failure hypertension, depression, of bilateral previous bunion surgery, right first toe amputation and [name] partial left third toe amputation for osteomyelitis. The patient states that she recently had work to do in her mother's house in [place] and so she was on her feet quite [name] lot which delayed the healing. ROSALINDA, SEAMAN [name] ([id number]) [id number] Page 6 of 11 ABI in our clinic was 1.1 on the left 8/31; plantar aspect of her left first metatarsal head. Using  silver collagen. This is probably [name] surgical wound 9/14; this is [name] patient who had [name] wound on the plantar aspect of her left first metatarsal head in the  setting of [name] significant bunion deformity. We use cervical collagen and [name] offloading shoe. She has significant diabetic neuropathy probably some degree of [name] Charcot deformity in her feet. This is closed over today. Readmission 03/30/2021 Ms. Sharmel Ballantine is [name] 71 year old female with [name] past medical history of insulin-dependent controlled type 2 diabetes s/p right great toe amputation, right knee osteoarthritis and hypertension that presents to the clinic for [name] second right toe wound. She states she was getting out of the bathtub when her foot got caught the tub and the skin to her second toe was split open. She noticed this after the bath when she saw blood on the floor. She has neuropathy in her feet. She visited the ED following the event on 03/26/2021. They obtained x-rays that showed [name] fracture to the fifth toe but no fracture to the second toe. She was started on doxycycline. She currently denies signs of infection. She has been keeping the area covered with [name] Band-Aid. Readmission 10/21/2021 Ms. Paraskevi Weiss is [name] 71 year old female with [name] past medical history of insulin dependent controlled type 2 diabetes complicated by peripheral neuropathy that presents to the clinic for [name] left met head wound. She has been following with Dr. Earleen Newport, podiatry for over [name] year for this issue. She has tried collagen, mupirocin ointment and Medihoney. She is at high fall risk and cannot tolerate [name] total contact cast. She has orthotics with specialized inserts for her current issue. She currently denies signs of infection. 8/10; patient presents for follow-up. Unfortunately Regranex was unaffordable at $1200. She has been using Medihoney to the wound bed. She reports aggressively offloading the area. She has offloading inserts. She denies signs of infection. 8/22;  patient presents for follow-up. She has been using Medihoney to the wound bed with improvement in wound healing. She has no issues or complaints today. She will be out of town starting next week for the next 2 weeks. 9/11; patient presents for follow-up. She has been using Medihoney to the wound bed. She went on vacation and was last seen 3 weeks ago. She states she had tried to aggressively offload the wound bed. 9/26; patient presents for follow-up. She has been using Medihoney to the wound bed. She has no issues or complaints today. 10/13; patient presents for follow-up. She has been using Medihoney to the wound bed. She has no issues or complaints today. We rediscussed the possibility of [name] total contact cast but she does not feel comfortable as she is [name] high fall risk and lives alone. 10/26; patient presents for follow-up. Has been using Medihoney to the wound bed. She has been approved for Grafix however has [name] 20% co-pay. Patient declined proceeding with this due to financial cost. Patient has no issues or complaints today. 11/16; patient presents for follow-up. She has been using Medihoney to the wound bed. She has been using her diabetic shoes with an offloading foam donut. She declines total contact cast placement today. 11/30; patient presents for follow-up. She has been using Medihoney to the wound bed. She did not receive silver alginate. She has been using the Pegasys with surgical shoe. There has been improvement in wound healing. Patient History Information obtained from Patient. Family History Cancer - Father, Hypertension - Mother,Father,Siblings, Stroke - Mother,Father, No family history of Diabetes, Heart Disease, Hereditary Spherocytosis, Kidney Disease, Lung Disease, Seizures, Thyroid Problems, Tuberculosis. Social History Never smoker, Marital Status - Single, Alcohol Use - Never, Drug Use - No History, Caffeine Use - Rarely. Medical  History Eyes Patient has history of  Cataracts Denies history of Glaucoma, Optic Neuritis Ear/Nose/Mouth/Throat Denies history of Chronic sinus problems/congestion, Middle ear problems Hematologic/Lymphatic Denies history of Anemia, Hemophilia, Human Immunodeficiency Virus, Lymphedema, Sickle Cell Disease Respiratory Patient has history of Sleep Apnea Denies history of Aspiration, Asthma, Chronic Obstructive Pulmonary Disease (COPD), Pneumothorax, Tuberculosis Cardiovascular Patient has history of Hypertension Denies history of Angina, Arrhythmia, Congestive Heart Failure, Coronary Artery Disease, Deep Vein Thrombosis, Hypotension, Myocardial Infarction, Peripheral Arterial Disease, Peripheral Venous Disease, Phlebitis, Vasculitis Gastrointestinal Denies history of Cirrhosis , Colitis, Crohnoos, Hepatitis [name], Hepatitis B, Hepatitis C Endocrine Patient has history of Type II Diabetes Denies history of Type I Diabetes Genitourinary Denies history of End Stage Renal Disease Immunological Denies history of Lupus Erythematosus, Raynaudoos, Scleroderma Integumentary (Skin) Denies history of History of Burn Musculoskeletal Patient has history of Osteoarthritis, Osteomyelitis - Hx in 2007 (left 3rd toe) Denies history of Gout, Rheumatoid Arthritis Neurologic Patient has history of Neuropathy Denies history of Dementia, Quadriplegia, Paraplegia, Seizure Disorder Oncologic Patient has history of Received Radiation - 03/2020 Denies history of Received Chemotherapy Psychiatric Patient has history of Confinement Anxiety Denies history of SAMORIA, FEDORKO [name] ([id number]) [phone].pdf Page 7 of 11 Hospitalization/Surgery History - Amputation of right great toe. - Tendon repair. - Hysterectomy. - Osteomyelitis left 3rd toe. - cervical fusion with cage. - left breast lumpectomy. Medical [name] Surgical History Notes nd Constitutional Symptoms (General Health) morbid  obesity Cardiovascular Takotsubo cardiomyopathy 2012, hyperlipidemia, Hx CVA, Tachycardia Genitourinary CKD stage 3 Musculoskeletal left leg weakness Neurologic CVA Oncologic hx endometrial cancer, left breast CA Psychiatric depression Objective Constitutional respirations regular, non-labored and within target range for patient.. Vitals Time Taken: 2:48 PM, Temperature: 98 F, Pulse: 64 bpm, Respiratory Rate: 20 breaths/min, Blood Pressure: 116/71 mmHg. Cardiovascular 2+ dorsalis pedis/posterior tibialis pulses. Psychiatric pleasant and cooperative. General Notes: Left foot: T the left first metatarsal there is an open wound with granulation tissue, nonviable tissue and callus. Postdebridement there is o healthy granulation tissue with no undermining. No signs of surrounding infection including increased warmth, erythema or purulent drainage. Integumentary (Hair, Skin) Wound #4 status is Open. Original cause of wound was Gradually Appeared. The date acquired was: 10/02/2020. The wound has been in treatment 17 weeks. The wound is located on the Left,Plantar Metatarsal head first. The wound measures 0.8cm length x 0.2cm width x 0.2cm depth; 0.126cm^2 area and 0.025cm^3 volume. There is Fat Layer (Subcutaneous Tissue) exposed. There is no tunneling or undermining noted. There is [name] medium amount of serosanguineous drainage noted. The wound margin is distinct with the outline attached to the wound base. There is large (67-100%) pink, pale granulation within the wound bed. There is no necrotic tissue within the wound bed. The periwound skin appearance exhibited: Callus. The periwound skin appearance did not exhibit: Crepitus, Excoriation, Induration, Rash, Scarring, Dry/Scaly, Maceration, Atrophie Blanche, Cyanosis, Ecchymosis, Hemosiderin Staining, Mottled, Pallor, Rubor, Erythema. Periwound temperature was noted as No Abnormality. Assessment Active Problems ICD-10 Non-pressure chronic  ulcer of other part of left foot with fat layer exposed Type 2 diabetes mellitus with foot ulcer Type 2 diabetes mellitus with diabetic polyneuropathy Patient's wound has shown improvement in size in appearance since last clinic visit. I debrided nonviable tissue. I recommended continuing honey and adding silver alginate. Continue with surgical shoe and peg assist. Follow-up in 1 week. Procedures Wound #4 Pre-procedure diagnosis of Wound #4 is [name] Diabetic Wound/Ulcer of the Lower Extremity located on the Left,Plantar Metatarsal head first .Severity of Tissue Pre Debridement  is: Fat layer exposed. There was [name] Excisional Skin/Subcutaneous Tissue Debridement with [name] total area of 0.16 sq cm performed by [name], DO. With the following instrument(s): Curette to remove Viable and Non-Viable tissue/material. Material removed includes Callus, Subcutaneous Tissue, and Slough after achieving pain control using Lidocaine. No specimens were taken. [name] time out was conducted at 15:05, prior to the start of the procedure. [name] Minimum amount of bleeding was controlled with Pressure. The procedure was tolerated well with [name] pain level of 0 throughout and [name] pain level of 0 following the procedure. Post Debridement Measurements: 0.8cm length x 0.2cm width x 0.2cm depth; 0.025cm^3 volume. Character of Wound/Ulcer Post Debridement is improved. Severity of Tissue Post Debridement is: Fat layer exposed. Post procedure Diagnosis Wound #4: Same as Pre-Procedure DORCUS, RIGA [name] ([id number]) [phone].pdf Page 8 of 11 Plan Follow-up Appointments: Return Appointment in 1 week. - w/ Dr. Heber [place] on Friday Other: - Prism=Supplies Anesthetic: (In clinic) Topical Lidocaine 5% applied to wound bed (In clinic) Topical Lidocaine 4% applied to wound bed Cellular or Tissue Based Products: Cellular or Tissue Based Product Type: - IVR for Grafix IVR for [employer]=$225 Copay Bathing/ Shower/  Hygiene: May shower and wash wound with soap and water. Edema Control - Lymphedema / SCD / Other: Avoid standing for long periods of time. Off-Loading: Other: - Diabetic Shoes Relieve pressure/time standing of feet ***Use surgical shoe with peg assist insert with cutout for wound for offloading*** Additional Orders / Instructions: Follow Nutritious Diet - Monitor/Control Blood Sugars Non Wound Condition: Other Non Wound Condition Orders/Instructions: - Apply corn pad or gauze between great toe and 2nd toe. WOUND #4: - Metatarsal head first Wound Laterality: Plantar, Left Cleanser: Soap and Water 1 x Per Day/30 Days Discharge Instructions: May shower and wash wound with dial antibacterial soap and water prior to dressing change. Cleanser: Wound Cleanser (Generic) 1 x Per Day/30 Days Discharge Instructions: Cleanse the wound with wound cleanser prior to applying [name] clean dressing using gauze sponges, not tissue or cotton balls. Peri-Wound Care: Skin Prep (Generic) 1 x Per Day/30 Days Discharge Instructions: Use skin prep as directed Prim Dressing: MediHoney Gel, tube 1.5 (oz) 1 x Per Day/30 Days ary Discharge Instructions: Apply until Regranex obtained Prim Dressing: Optifoam Non-Adhesive Dressing, 4x4 in (Generic) 1 x Per Day/30 Days ary Discharge Instructions: Apply as donut around wound Prim Dressing: KerraCel Ag Gelling Fiber Dressing, 4x5 in (silver alginate) (Generic) 1 x Per Day/30 Days ary Discharge Instructions: Apply silver alginate to wound bed as instructed Secondary Dressing: ALLEVYN Gentle Border, 3x3 (in/in) (Generic) 1 x Per Day/30 Days Discharge Instructions: Apply over primary dressing as directed. 1. In office sharp debridement 2. Pegasys and surgical shoe 3. Silver alginate and Medihoney 4. Follow-up in 1 week Electronic Signature(s) Signed: 02/17/2022 4:36:48 PM By: [name] DO Entered By: [name] on 02/17/2022  15:55:45 -------------------------------------------------------------------------------- HxROS Details Patient Name: Date of Service: [name], Holly RA H [name]. 02/17/2022 1:45 PM Medical Record Number: [id number] Patient Account Number: [id number] Date of Birth/Sex: Treating RN: Dec 30, 1950 (71 y.o. F) Primary Care Provider: [name] Other Clinician: Referring Provider: Treating Provider/Extender: [name] in Treatment: 17 Information Obtained From Patient Constitutional Symptoms (Davis) Medical History: Past Medical History Notes: morbid obesity Eyes Medical HistoryREANNE, NELLUMS [name] ([id number]) [id number] Page 9 of 11 Positive for: Cataracts Negative for: Glaucoma; Optic Neuritis Ear/Nose/Mouth/Throat Medical History: Negative for: Chronic sinus problems/congestion; Middle ear problems Hematologic/Lymphatic Medical History: Negative for: Anemia; Hemophilia; Human Immunodeficiency  Virus; Lymphedema; Sickle Cell Disease Respiratory Medical History: Positive for: Sleep Apnea Negative for: Aspiration; Asthma; Chronic Obstructive Pulmonary Disease (COPD); Pneumothorax; Tuberculosis Cardiovascular Medical History: Positive for: Hypertension Negative for: Angina; Arrhythmia; Congestive Heart Failure; Coronary Artery Disease; Deep Vein Thrombosis; Hypotension; Myocardial Infarction; Peripheral Arterial Disease; Peripheral Venous Disease; Phlebitis; Vasculitis Past Medical History Notes: Takotsubo cardiomyopathy 2012, hyperlipidemia, Hx CVA, Tachycardia Gastrointestinal Medical History: Negative for: Cirrhosis ; Colitis; Crohns; Hepatitis [name]; Hepatitis B; Hepatitis C Endocrine Medical History: Positive for: Type II Diabetes Negative for: Type I Diabetes Time with diabetes: over 20 years Treated with: Insulin, Oral agents Blood sugar tested every day: Yes Tested : daily Genitourinary Medical  History: Negative for: End Stage Renal Disease Past Medical History Notes: CKD stage 3 Immunological Medical History: Negative for: Lupus Erythematosus; Raynauds; Scleroderma Integumentary (Skin) Medical History: Negative for: History of Burn Musculoskeletal Medical History: Positive for: Osteoarthritis; Osteomyelitis - Hx in 2007 (left 3rd toe) Negative for: Gout; Rheumatoid Arthritis Past Medical History Notes: left leg weakness Neurologic Medical History: Positive for: Neuropathy Negative for: Dementia; Quadriplegia; Paraplegia; Seizure Disorder Past Medical History Notes: CVA Oncologic Medical History: Positive for: Received Radiation - 03/2020 Negative for: Received Chemotherapy NEVEAH, BANG [name] ([id number]) [id number] Page 10 of 11 Past Medical History Notes: hx endometrial cancer, left breast CA Psychiatric Medical History: Positive for: Confinement Anxiety Negative for: Anorexia/bulimia Past Medical History Notes: depression HBO Extended History Items Eyes: Cataracts Immunizations Pneumococcal Vaccine: Received Pneumococcal Vaccination: Yes Received Pneumococcal Vaccination On or After 60th Birthday: Yes Implantable Devices None Hospitalization / Surgery History Type of Hospitalization/Surgery Amputation of right great toe Tendon repair Hysterectomy Osteomyelitis left 3rd toe cervical fusion with cage left breast lumpectomy Family and Social History Cancer: Yes - Father; Diabetes: No; Heart Disease: No; Hereditary Spherocytosis: No; Hypertension: Yes - Mother,Father,Siblings; Kidney Disease: No; Lung Disease: No; Seizures: No; Stroke: Yes - Mother,Father; Thyroid Problems: No; Tuberculosis: No; Never smoker; Marital Status - Single; Alcohol Use: Never; Drug Use: No History; Caffeine Use: Rarely; Financial Concerns: No; Food, Clothing or Shelter Needs: No; Support System Lacking: No; Transportation Concerns: No Electronic  Signature(s) Signed: 02/17/2022 4:36:48 PM By: [name] DO Entered By: [name] on 02/17/2022 15:54:22 -------------------------------------------------------------------------------- SuperBill Details Patient Name: Date of Service: [name], Holly RA H [name]. 02/17/2022 Medical Record Number: [id number] Patient Account Number: [id number] Date of Birth/Sex: Treating RN: Feb 04, 1951 (71 y.o. Tonita Phoenix, [name] Primary Care Provider: [name] Other Clinician: Referring Provider: Treating Provider/Extender: [name] Weeks in Treatment: 17 Diagnosis Coding ICD-10 Codes Code Description [phone] Non-pressure chronic ulcer of other part of left foot with fat layer exposed E11.621 Type 2 diabetes mellitus with foot ulcer E11.42 Type 2 diabetes mellitus with diabetic polyneuropathy Facility Procedures : DELISIA, MCQUISTON Code: [id number] [facility] [name] ([id number]) E Description: 11042 - DEB SUBQ TISSUE 20 SQ CM/< ICD-10 Diagnosis Description L97.522 Non-pressure chronic ulcer of other part of left foot with fat layer exposed [id number] 11.621 Type 2 diabetes mellitus with foot ulcer Modifier: [id number] Quantity: 1 .pdf Page 11 of 11 Physician Procedures : CPT4 Code Description Modifier [id number] - WC PHYS SUBQ TISS 20 SQ CM ICD-10 Diagnosis Description L97.522 Non-pressure chronic ulcer of other part of left foot with fat layer exposed E11.621 Type 2 diabetes mellitus with foot ulcer Quantity: 1 Electronic Signature(s) Signed: 02/17/2022 4:36:48 PM By: [name] DO Entered By: [name] on 02/17/2022 15:56:01

## 2022-02-25 ENCOUNTER — Encounter (HOSPITAL_BASED_OUTPATIENT_CLINIC_OR_DEPARTMENT_OTHER): Payer: PPO | Attending: Internal Medicine | Admitting: Internal Medicine

## 2022-02-25 DIAGNOSIS — E1122 Type 2 diabetes mellitus with diabetic chronic kidney disease: Secondary | ICD-10-CM | POA: Diagnosis not present

## 2022-02-25 DIAGNOSIS — N183 Chronic kidney disease, stage 3 unspecified: Secondary | ICD-10-CM | POA: Diagnosis not present

## 2022-02-25 DIAGNOSIS — L97522 Non-pressure chronic ulcer of other part of left foot with fat layer exposed: Secondary | ICD-10-CM | POA: Diagnosis not present

## 2022-02-25 DIAGNOSIS — Z89411 Acquired absence of right great toe: Secondary | ICD-10-CM | POA: Diagnosis not present

## 2022-02-25 DIAGNOSIS — E11621 Type 2 diabetes mellitus with foot ulcer: Secondary | ICD-10-CM | POA: Insufficient documentation

## 2022-02-25 DIAGNOSIS — I129 Hypertensive chronic kidney disease with stage 1 through stage 4 chronic kidney disease, or unspecified chronic kidney disease: Secondary | ICD-10-CM | POA: Diagnosis not present

## 2022-02-25 DIAGNOSIS — E1151 Type 2 diabetes mellitus with diabetic peripheral angiopathy without gangrene: Secondary | ICD-10-CM | POA: Insufficient documentation

## 2022-02-25 DIAGNOSIS — E1142 Type 2 diabetes mellitus with diabetic polyneuropathy: Secondary | ICD-10-CM | POA: Insufficient documentation

## 2022-02-25 DIAGNOSIS — M1711 Unilateral primary osteoarthritis, right knee: Secondary | ICD-10-CM | POA: Insufficient documentation

## 2022-02-25 DIAGNOSIS — Z794 Long term (current) use of insulin: Secondary | ICD-10-CM | POA: Diagnosis not present

## 2022-02-26 NOTE — Progress Notes (Signed)
ILARIA, MUCH [name] ([id number]) [id number] Page 1 of 11 Visit Report for 02/25/2022 Chief Complaint Document Details Patient Name: Date of Service: [name]. 02/25/2022 11:00 [name]: [id number] Patient Account Number: [id number] Date of Birth/Sex: Treating RN: [birth year] (71 y.o. F) Primary Care Provider: Harlan [name] Other Clinician: Referring Provider: Treating Provider/Extender: [name] in Treatment: 18 Information Obtained from: Patient Chief Complaint Left plantar ulcer 11/12/2019; patient is here referred by podiatry for review of [name] wound on the left first plantar metatarsal head 03/30/2021; right second toe wound s/p Trauma 10/21/2021; First left metatarsal head wound Electronic Signature(s) Signed: 02/25/2022 12:38:41 PM By: Holly Shan DO Entered By: [name] on 02/25/2022 12:34:49 -------------------------------------------------------------------------------- Debridement Details Patient Name: Date of Service: [name], [name]. 02/25/2022 11:00 [name]: [id number] Patient Account Number: [id number] Date of Birth/Sex: Treating RN: 1950/08/09 (71 y.o. [name], [name] Primary Care Provider: Harlan [name] Other Clinician: Referring Provider: Treating Provider/Extender: [name] in Treatment: 18 Debridement Performed for Assessment: Wound #4 Left,Plantar Metatarsal head first Performed By: Physician Holly Shan, DO Debridement Type: Debridement Severity of Tissue Pre Debridement: Fat layer exposed Level of Consciousness (Pre-procedure): Awake and Alert Pre-procedure Verification/Time Out Yes - 11:40 Taken: Start Time: 11:41 Pain Control: Lidocaine 4% T opical Solution T Area Debrided (L x W): otal 2 (cm) x 2 (cm) = 4 (cm) Tissue and other material debrided: Viable, Non-Viable, Callus, Slough, Subcutaneous, Slough Level:  Skin/Subcutaneous Tissue Debridement Description: Excisional Instrument: Curette Bleeding: Minimum Hemostasis Achieved: Pressure End Time: 11:45 Procedural Pain: 0 Post Procedural Pain: 0 Response to Treatment: Procedure was tolerated well Level of Consciousness (Post- Awake and Alert procedure): Post Debridement Measurements of Total Wound Length: (cm) 0.6 Width: (cm) 0.2 Depth: (cm) 0.2 Volume: (cm) 0.019 [name] ([id number]) [id number] Page 2 of 11 Character of Wound/Ulcer Post Debridement: Improved Severity of Tissue Post Debridement: Fat layer exposed Post Procedure Diagnosis Same as Pre-procedure Electronic Signature(s) Signed: 02/25/2022 12:38:41 PM By: Holly Shan DO Signed: 02/25/2022 3:28:23 PM By: Holly Pilling RN, BSN Entered By: [name] on 02/25/2022 11:45:48 -------------------------------------------------------------------------------- HPI Details Patient Name: Date of Service: [name], [name]. 02/25/2022 11:00 [name]: [id number] Patient Account Number: [id number] Date of Birth/Sex: Treating RN: [birth year] (71 y.o. F) Primary Care Provider: Harlan [name] Other Clinician: Referring Provider: Treating Provider/Extender: [name] in Treatment: 18 History of Present Illness HPI Description: 05/10/17 patient presents today for initial evaluation and our clinic concerning issues that she has been having with an ulceration on the plantar aspect of the left first metatarsal head. She has been seen [name] for this since August 2018. Subsequently he has recommended bunion surgery as that seems to be the culprit for why there is pressure and friction occurring at the site causing the callous and subsequently the wound. 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 With that being said I do have notes from several of his visits one of which does include an x-ray documentation stating that she had [name] negative x-ray. There was obviously the bunion the notes I have extended from 11 deformity. With that being said she has not had an MRI fortunately this wound does not probe to bone. The notes I have  extended from February 08, 2017 through April 14, 2017. During that course she was also placed on antibiotics to help with an infection two weeks ago and this was doxycycline. Fortunately that seems to have completely resolved any infection issues that she may have had. Unfortunately this is an ulcer that she has actually been dealing with since April 2018. She just initially was trying to treat it and care for it on her own. Her most recent hemoglobin A1c was 5.8, she is [name] non-smoker, she does have [name] right first toe amputation and [name] left third toe amputation. Fortunately she's not having any discomfort at the site. 05/17/17 on evaluation today patient's ulcer on the plantar aspect of the first metatarsal region appears to be close at this point. There does not appear to be any evidence of ulcer opening. This is on initial inspection. She has not really had any discomfort but honestly she really was not experiencing [name] lot of pain even before. There is no evidence of infection or fluctuation under the callous which is present at this point. ADMISSION 11/12/2019 This is [name] 71 year old woman that we previously had in this clinic in 2017 for two visits with [name] wound I think roughly in the same area. At that point she had [name] bunion in the left. She has apparently had surgery on this since then. Looking through care everywhere we can see that she has had follow-up with podiatry from 06/12/2019 through 10/28/2019 for [name] wound on the plantar aspect of her left first MTP. She states when this started she was having severe knee pain on the right  which forced her to alter her gait. She has recently received doxycycline which she is finished and Bactroban. She is back to using Medihoney on the wound. She is offloading with the Pegasys [name]. Past medical history includes type 2 diabetes with [name] recent hemoglobin A1c of 6.8, severe type II diabetic neuropathy, history of [name] CVA, stage III chronic renal failure hypertension, depression, of bilateral previous bunion surgery, right first toe amputation and [name] partial left third toe amputation for osteomyelitis. The patient states that she recently had work to do in her mother's house in [place] and so she was on her feet quite [name] lot which delayed the healing. ABI in our clinic was 1.1 on the left 8/31; plantar aspect of her left first metatarsal head. Using silver collagen. This is probably [name] surgical wound 9/14; this is [name] patient who had [name] wound on the plantar aspect of her left first metatarsal head in the setting of [name] significant bunion deformity. We use cervical collagen and [name] offloading [name]. She has significant diabetic neuropathy probably some degree of [name] Charcot deformity in her feet. This is closed over today. Readmission 03/30/2021 [name] is [name] 71 year old female with [name] past medical history of insulin-dependent controlled type 2 diabetes s/p right great toe amputation, right knee osteoarthritis and hypertension that presents to the clinic for [name] second right toe wound. She states she was getting out of the bathtub when her foot got caught the tub and the skin to her second toe was split open. She noticed this after the bath when she saw blood on the floor. She has neuropathy in her feet. She visited the ED following the event on 03/26/2021. They obtained x-rays that showed [name] fracture to the fifth toe but no fracture to the second toe. She was started on doxycycline. She currently denies signs of infection. She has been keeping  the area covered with [name] Band-Aid. Readmission  10/21/2021 [name] is [name] 71 year old female with [name] past medical history of insulin dependent controlled type 2 diabetes complicated by peripheral neuropathy that presents to the clinic for [name] left met head wound. She has been following with Dr. Earleen Newport, podiatry for over [name] year for this issue. She has tried collagen, mupirocin ointment and Medihoney. She is at high fall risk and cannot tolerate [name] total contact cast. She has orthotics with specialized inserts for her current issue. She currently denies signs of infection. 8/10; patient presents for follow-up. Unfortunately Regranex was unaffordable at $1200. She has been using Medihoney to the wound bed. She reports aggressively offloading the area. She has offloading inserts. She denies signs of infection. 8/22; patient presents for follow-up. She has been using Medihoney to the wound bed with improvement in wound healing. She has no issues or complaints today. She will be out of town starting next week for the next 2 weeks. FELICITE, ZEIMET [name] ([id number]) [id number] Page 3 of 11 9/11; patient presents for follow-up. She has been using Medihoney to the wound bed. She went on vacation and was last seen 3 weeks ago. She states she had tried to aggressively offload the wound bed. 9/26; patient presents for follow-up. She has been using Medihoney to the wound bed. She has no issues or complaints today. 10/13; patient presents for follow-up. She has been using Medihoney to the wound bed. She has no issues or complaints today. We rediscussed the possibility of [name] total contact cast but she does not feel comfortable as she is [name] high fall risk and lives alone. 10/26; patient presents for follow-up. Has been using Medihoney to the wound bed. She has been approved for Grafix however has [name] 20% co-pay. Patient declined proceeding with this due to financial cost. Patient has no issues or complaints today. 11/16; patient presents for  follow-up. She has been using Medihoney to the wound bed. She has been using her diabetic shoes with an offloading foam donut. She declines total contact cast placement today. 11/30; patient presents for follow-up. She has been using Medihoney to the wound bed. She did not receive silver alginate. She has been using the Pegasys with surgical [name]. There has been improvement in wound healing. 12/8; patient presents for follow-up. She has been using Medihoney and silver alginate to the wound bed. She has been using the Pegasys with surgical [name] without issues. Electronic Signature(s) Signed: 02/25/2022 12:38:41 PM By: Holly Shan DO Entered By: [name] on 02/25/2022 12:35:50 -------------------------------------------------------------------------------- Physical Exam Details Patient Name: Date of Service: [name], [name] RA H [name]. 02/25/2022 11:00 [name]: [id number] Patient Account Number: [id number] Date of Birth/Sex: Treating RN: 1950-10-04 (71 y.o. F) Primary Care Provider: Harlan [name] Other Clinician: Referring Provider: Treating Provider/Extender: [name] in Treatment: 18 Constitutional respirations regular, non-labored and within target range for patient.. Cardiovascular 2+ dorsalis pedis/posterior tibialis pulses. Psychiatric pleasant and cooperative. Notes Left foot: T the left first metatarsal there is an open wound with granulation tissue, nonviable tissue and callus. Postdebridement there is healthy granulation o tissue. No surrounding signs of infection. Electronic Signature(s) Signed: 02/25/2022 12:38:41 PM By: Holly Shan DO Entered By: [name] on 02/25/2022 12:36:39 -------------------------------------------------------------------------------- Physician Orders Details Patient Name: Date of Service: [name], [name]. 02/25/2022 11:00 [name]: [id number] Patient Account  Number: [id number] Date of Birth/Sex: Treating RN: 02-22-1951 (71 y.o. F) Deaton, [place]ue  Primary Care Provider: Harlan [name] Other Clinician: Referring Provider: Treating Provider/Extender: [name] in Treatment: 79 Verbal / Phone Orders: Mateo Flow [name] ([id number]) [id number] Page 4 of 11 Diagnosis Coding ICD-10 Coding Code Description [phone] Non-pressure chronic ulcer of other part of left foot with fat layer exposed E11.621 Type 2 diabetes mellitus with foot ulcer E11.42 Type 2 diabetes mellitus with diabetic polyneuropathy Follow-up Appointments ppointment in 1 week. - Dr. Heber Robinson Tuesday 03/08/2022 Return [name] ppointment in 2 weeks. - Dr. Heber Merrill Tuesday 03/15/2022 Return [name] Other: - Prism=Supplies Anesthetic (In clinic) Topical Lidocaine 5% applied to wound bed (In clinic) Topical Lidocaine 4% applied to wound bed Cellular or Tissue Based Products Cellular or Tissue Based Product Type: - IVR for Grafix IVR for [employer]=$225 Copay Bathing/ Shower/ Hygiene May shower and wash wound with soap and water. Edema Control - Lymphedema / SCD / Other Avoid standing for long periods of time. Off-Loading Other: - Diabetic Shoes Relieve pressure/time standing of feet ***Use surgical [name] with peg assist insert with cutout for wound for offloading*** Additional Orders / Instructions Follow Nutritious Diet - Monitor/Control Blood Sugars Non Wound Condition Other Non Wound Condition Orders/Instructions: - Apply corn pad or gauze between great toe and 2nd toe. Wound Treatment Wound #4 - Metatarsal head first Wound Laterality: Plantar, Left Cleanser: Soap and Water 1 x Per Day/30 Days Discharge Instructions: May shower and wash wound with dial antibacterial soap and water prior to dressing change. Cleanser: Wound Cleanser (Generic) 1 x Per Day/30 Days Discharge Instructions: Cleanse the wound with wound cleanser prior to  applying [name] clean dressing using gauze sponges, not tissue or cotton balls. Peri-Wound Care: Skin Prep (Generic) 1 x Per Day/30 Days Discharge Instructions: Use skin prep as directed Prim Dressing: MediHoney Gel, tube 1.5 (oz) 1 x Per Day/30 Days ary Discharge Instructions: Apply until Regranex obtained Prim Dressing: Optifoam Non-Adhesive Dressing, 4x4 in (Generic) 1 x Per Day/30 Days ary Discharge Instructions: Apply as donut around wound Prim Dressing: KerraCel Ag Gelling Fiber Dressing, 4x5 in (silver alginate) (Generic) 1 x Per Day/30 Days ary Discharge Instructions: Apply silver alginate to wound bed as instructed Secondary Dressing: ALLEVYN Gentle Border, 3x3 (in/in) (Generic) 1 x Per Day/30 Days Discharge Instructions: Apply over primary dressing as directed. Electronic Signature(s) Signed: 02/25/2022 12:38:41 PM By: Holly Shan DO Entered By: [name] on 02/25/2022 12:36:45 Foot, Hines [name] ([id number]) [id number] Page 5 of 11 -------------------------------------------------------------------------------- Problem List Details Patient Name: Date of Service: [name]. 02/25/2022 11:00 [name]: [id number] Patient Account Number: [id number] Date of Birth/Sex: Treating RN: 01-Jan-1951 (71 y.o. [name], [name] Primary Care Provider: Harlan [name] Other Clinician: Referring Provider: Treating Provider/Extender: [name] in Treatment: 18 Active Problems ICD-10 Encounter Code Description Active Date MDM Diagnosis L97.522 Non-pressure chronic ulcer of other part of left foot with fat layer exposed 10/21/2021 No Yes E11.621 Type 2 diabetes mellitus with foot ulcer 10/21/2021 No Yes E11.42 Type 2 diabetes mellitus with diabetic polyneuropathy 10/21/2021 No Yes Inactive Problems Resolved Problems Electronic Signature(s) Signed: 02/25/2022 12:38:41 PM By: Holly Shan DO Entered By:  [name] on 02/25/2022 12:34:30 -------------------------------------------------------------------------------- Progress Note Details Patient Name: Date of Service: [name], [name]. 02/25/2022 11:00 [name]: [id number] Patient Account Number: [id number] Date of Birth/Sex: Treating RN: May 03, 1950 (71 y.o. F) Primary Care Provider: Harlan [name] Other Clinician: Referring Provider: Treating Provider/Extender: [name] Weeks in Treatment: [phone]  Subjective Chief Complaint Information obtained from Patient Left plantar ulcer 11/12/2019; patient is here referred by podiatry for review of [name] wound on the left first plantar metatarsal head 03/30/2021; right second toe wound s/p Trauma 10/21/2021; First left metatarsal head wound History of Present Illness (HPI) 05/10/17 patient presents today for initial evaluation and our clinic concerning issues that she has been having with an ulceration on the plantar aspect of the left first metatarsal head. She has been seen [name] for this since August 2018. Subsequently he has recommended bunion surgery as that seems to be the culprit for why there is pressure and friction occurring at the site causing the callous and subsequently the wound. 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 With that being said I do have notes from several of his visits one of which does include an x-ray documentation stating that she had [name] negative x-ray. There was obviously the bunion the notes I have extended from 11 deformity. With that being said she has not had an MRI fortunately this wound does not probe to bone. The notes I have extended from February 08, 2017 through April 14, 2017. During that course she was also placed on antibiotics to help with an infection two weeks  ago and this was doxycycline. Fortunately that seems to have completely resolved any infection issues that she may have had. Unfortunately this is an ulcer that she has actually been dealing with since April 2018. She just initially was trying to treat it and care for it on her own. Her most recent hemoglobin A1c was 5.8, she is [name] non-smoker, she does have [name] right first toe amputation and [name] left third toe amputation. Fortunately she's not having any discomfort at the site. 05/17/17 on evaluation today patient's ulcer on the plantar aspect of the first metatarsal region appears to be close at this point. There does not appear to be any evidence of ulcer opening. This is on initial inspection. She has not really had any discomfort but honestly she really was not experiencing [name] lot of pain even before. There is no evidence of infection or fluctuation under the callous which is present at this point. CLARRISA, KAYLOR [name] ([id number]) [id number] Page 6 of 11 ADMISSION 11/12/2019 This is [name] 71 year old woman that we previously had in this clinic in 2017 for two visits with [name] wound I think roughly in the same area. At that point she had [name] bunion in the left. She has apparently had surgery on this since then. Looking through care everywhere we can see that she has had follow-up with podiatry from 06/12/2019 through 10/28/2019 for [name] wound on the plantar aspect of her left first MTP. She states when this started she was having severe knee pain on the right which forced her to alter her gait. She has recently received doxycycline which she is finished and Bactroban. She is back to using Medihoney on the wound. She is offloading with the Pegasys [name]. Past medical history includes type 2 diabetes with [name] recent hemoglobin A1c of 6.8, severe type II diabetic neuropathy, history of [name] CVA, stage III chronic renal failure hypertension, depression, of bilateral previous bunion surgery, right first  toe amputation and [name] partial left third toe amputation for osteomyelitis. The patient states that she recently had work to do in her [place]'s house in [place] and so she was on her feet quite [name] lot which delayed the healing. ABI in our clinic was 1.1 on the left 8/31; plantar aspect of her left first metatarsal head. Using silver collagen. This is probably [name] surgical wound 9/14; this is [name] patient who had [name] wound on the plantar aspect of her left first metatarsal head in the setting of [name] significant bunion deformity. We use cervical collagen and [name] offloading [name]. She has significant diabetic neuropathy probably some degree of [name] Charcot deformity in her feet. This is closed over today. Readmission 03/30/2021 [name] is [name] 71 year old female with [name] past medical history of insulin-dependent controlled type 2 diabetes s/p right great toe amputation, right knee osteoarthritis and hypertension that presents to the clinic for [name] second right toe wound. She states she was getting out of the bathtub when her foot got caught the tub and the skin to her second toe was split open. She noticed this after the bath when she saw blood on the floor. She has neuropathy in her feet. She visited the ED following the event on 03/26/2021. They obtained x-rays that showed [name] fracture to the fifth toe but no fracture to the second toe. She was started on doxycycline. She currently denies signs of infection. She has been keeping the area covered with [name] Band-Aid. Readmission 10/21/2021 [name] is [name] 71 year old female with [name] past medical history of insulin dependent controlled type 2 diabetes complicated by peripheral neuropathy that presents to the clinic for [name] left met head wound. She has been following with Dr. Earleen Newport, podiatry for over [name] year for this issue. She has tried collagen, mupirocin ointment and Medihoney. She is at high fall risk and cannot tolerate [name] total contact cast. She has orthotics with  specialized inserts for her current issue. She currently denies signs of infection. 8/10; patient presents for follow-up. Unfortunately Regranex was unaffordable at $1200. She has been using Medihoney to the wound bed. She reports aggressively offloading the area. She has offloading inserts. She denies signs of infection. 8/22; patient presents for follow-up. She has been using Medihoney to the wound bed with improvement in wound healing. She has no issues or complaints today. She will be out of town starting next week for the next 2 weeks. 9/11; patient presents for follow-up. She has been using Medihoney to the wound bed. She went on vacation and was last seen 3 weeks ago. She states she had tried to aggressively offload the wound bed. 9/26; patient presents for follow-up. She has been using Medihoney to the wound bed. She has no issues or complaints today. 10/13; patient presents for follow-up. She has been using Medihoney to the wound bed. She has no issues or complaints today. We rediscussed the possibility of [name] total contact cast but she does not feel comfortable as she is [name] high fall risk and lives alone. 10/26; patient presents for follow-up. Has been using Medihoney to the wound bed. She has been approved for Grafix however has [name] 20% co-pay. Patient declined proceeding with this due to financial cost. Patient has no issues or complaints today. 11/16; patient presents for follow-up. She has been using Medihoney to the wound bed. She has been using her diabetic shoes with an offloading foam donut. She declines total contact cast placement today. 11/30; patient presents for follow-up. She has been using Medihoney to the wound bed. She did not receive silver alginate. She has been using the Pegasys with surgical [name].  There has been improvement in wound healing. 12/8; patient presents for follow-up. She has been using Medihoney and silver alginate to the wound bed. She has been using the  Pegasys with surgical [name] without issues. Patient History Information obtained from Patient. Family History Cancer - Father, Hypertension - Mother,Father,Siblings, Stroke - Mother,Father, No family history of Diabetes, Heart Disease, Hereditary Spherocytosis, Kidney Disease, Lung Disease, Seizures, Thyroid Problems, Tuberculosis. Social History Never smoker, Marital Status - Single, Alcohol Use - Never, Drug Use - No History, Caffeine Use - Rarely. Medical History Eyes Patient has history of Cataracts Denies history of Glaucoma, Optic Neuritis Ear/Nose/Mouth/Throat Denies history of Chronic sinus problems/congestion, Middle ear problems Hematologic/Lymphatic Denies history of Anemia, Hemophilia, Human Immunodeficiency Virus, Lymphedema, Sickle Cell Disease Respiratory Patient has history of Sleep Apnea Denies history of Aspiration, Asthma, Chronic Obstructive Pulmonary Disease (COPD), Pneumothorax, Tuberculosis Cardiovascular Patient has history of Hypertension Denies history of Angina, Arrhythmia, Congestive Heart Failure, Coronary Artery Disease, Deep Vein Thrombosis, Hypotension, Myocardial Infarction, Peripheral Arterial Disease, Peripheral Venous Disease, Phlebitis, Vasculitis Gastrointestinal Denies history of Cirrhosis , Colitis, Crohnoos, Hepatitis [name], Hepatitis B, Hepatitis C Endocrine Patient has history of Type II Diabetes Denies history of Type I Diabetes Genitourinary Denies history of End Stage Renal Disease Immunological Denies history of Lupus Erythematosus, Raynaudoos, Scleroderma Rule, [name] ([id number]) [id number] Page 7 of 11 Integumentary (Skin) Denies history of History of Burn Musculoskeletal Patient has history of Osteoarthritis, Osteomyelitis - Hx in 2007 (left 3rd toe) Denies history of Gout, Rheumatoid Arthritis Neurologic Patient has history of Neuropathy Denies history of Dementia, Quadriplegia, Paraplegia,  Seizure Disorder Oncologic Patient has history of Received Radiation - 03/2020 Denies history of Received Chemotherapy Psychiatric Patient has history of Confinement Anxiety Denies history of Anorexia/bulimia Hospitalization/Surgery History - Amputation of right great toe. - Tendon repair. - Hysterectomy. - Osteomyelitis left 3rd toe. - cervical fusion with cage. - left breast lumpectomy. Medical [name] Surgical History Notes nd Constitutional Symptoms (General Health) morbid obesity Cardiovascular Takotsubo cardiomyopathy 2012, hyperlipidemia, Hx CVA, Tachycardia Genitourinary CKD stage 3 Musculoskeletal left leg weakness Neurologic CVA Oncologic hx endometrial cancer, left breast CA Psychiatric depression Objective Constitutional respirations regular, non-labored and within target range for patient.. Vitals Time Taken: 11:13 AM, Temperature: 98 F, Pulse: 84 bpm, Respiratory Rate: 18 breaths/min, Blood Pressure: 121/71 mmHg. Cardiovascular 2+ dorsalis pedis/posterior tibialis pulses. Psychiatric pleasant and cooperative. General Notes: Left foot: T the left first metatarsal there is an open wound with granulation tissue, nonviable tissue and callus. Postdebridement there is o healthy granulation tissue. No surrounding signs of infection. Integumentary (Hair, Skin) Wound #4 status is Open. Original cause of wound was Gradually Appeared. The date acquired was: 10/02/2020. The wound has been in treatment 18 weeks. The wound is located on the Left,Plantar Metatarsal head first. The wound measures 0.6cm length x 0.2cm width x 0.2cm depth; 0.094cm^2 area and 0.019cm^3 volume. There is Fat Layer (Subcutaneous Tissue) exposed. There is no tunneling or undermining noted. There is [name] medium amount of serosanguineous drainage noted. The wound margin is distinct with the outline attached to the wound base. There is large (67-100%) pink, pale granulation within the wound bed. There is no  necrotic tissue within the wound bed. The periwound skin appearance exhibited: Callus. The periwound skin appearance did not exhibit: Crepitus, Excoriation, Induration, Rash, Scarring, Dry/Scaly, Maceration, Atrophie Blanche, Cyanosis, Ecchymosis, Hemosiderin Staining, Mottled, Pallor, Rubor, Erythema. Periwound temperature was noted as No Abnormality. Assessment Active Problems ICD-10 Non-pressure chronic ulcer of other part of left foot with  fat layer exposed Type 2 diabetes mellitus with foot ulcer Type 2 diabetes mellitus with diabetic polyneuropathy Patient's wound has shown improvement in size and appearance since last clinic visit. I debrided nonviable tissue. I recommended continuing with silver alginate and Medihoney and aggressive offloading with the Pegasys insert surgical [name]. Follow-up in 2 weeks. [name], [name] ([id number]) [id number] Page 8 of 11 Procedures Wound #4 Pre-procedure diagnosis of Wound #4 is [name] Diabetic Wound/Ulcer of the Lower Extremity located on the Left,Plantar Metatarsal head first .Severity of Tissue Pre Debridement is: Fat layer exposed. There was [name] Excisional Skin/Subcutaneous Tissue Debridement with [name] total area of 4 sq cm performed by Holly Shan, DO. With the following instrument(s): Curette to remove Viable and Non-Viable tissue/material. Material removed includes Callus, Subcutaneous Tissue, and Slough after achieving pain control using Lidocaine 4% Topical Solution. [name] time out was conducted at 11:40, prior to the start of the procedure. [name] Minimum amount of bleeding was controlled with Pressure. The procedure was tolerated well with [name] pain level of 0 throughout and [name] pain level of 0 following the procedure. Post Debridement Measurements: 0.6cm length x 0.2cm width x 0.2cm depth; 0.019cm^3 volume. Character of Wound/Ulcer Post Debridement is improved. Severity of Tissue Post Debridement is: Fat layer exposed. Post  procedure Diagnosis Wound #4: Same as Pre-Procedure Plan Follow-up Appointments: Return Appointment in 1 week. - Dr. Heber Whitney Tuesday 03/08/2022 Return Appointment in 2 weeks. - Dr. Heber [place] Tuesday 03/15/2022 Other: - [facility]=Supplies Anesthetic: (In clinic) Topical Lidocaine 5% applied to wound bed (In clinic) Topical Lidocaine 4% applied to wound bed Cellular or Tissue Based Products: Cellular or Tissue Based Product Type: - IVR for Grafix IVR for [employer]=$225 Copay Bathing/ Shower/ Hygiene: May shower and wash wound with soap and water. Edema Control - Lymphedema / SCD / Other: Avoid standing for long periods of time. Off-Loading: Other: - Diabetic Shoes Relieve pressure/time standing of feet ***Use surgical [name] with peg assist insert with cutout for wound for offloading*** Additional Orders / Instructions: Follow Nutritious Diet - Monitor/Control Blood Sugars Non Wound Condition: Other Non Wound Condition Orders/Instructions: - Apply corn pad or gauze between great toe and 2nd toe. WOUND #4: - Metatarsal head first Wound Laterality: Plantar, Left Cleanser: Soap and Water 1 x Per Day/30 Days Discharge Instructions: May shower and wash wound with dial antibacterial soap and water prior to dressing change. Cleanser: Wound Cleanser (Generic) 1 x Per Day/30 Days Discharge Instructions: Cleanse the wound with wound cleanser prior to applying [name] clean dressing using gauze sponges, not tissue or cotton balls. Peri-Wound Care: Skin Prep (Generic) 1 x Per Day/30 Days Discharge Instructions: Use skin prep as directed Prim Dressing: MediHoney Gel, tube 1.5 (oz) 1 x Per Day/30 Days ary Discharge Instructions: Apply until Regranex obtained Prim Dressing: Optifoam Non-Adhesive Dressing, 4x4 in (Generic) 1 x Per Day/30 Days ary Discharge Instructions: Apply as donut around wound Prim Dressing: KerraCel Ag Gelling Fiber Dressing, 4x5 in (silver alginate) (Generic) 1 x Per Day/30  Days ary Discharge Instructions: Apply silver alginate to wound bed as instructed Secondary Dressing: ALLEVYN Gentle Border, 3x3 (in/in) (Generic) 1 x Per Day/30 Days Discharge Instructions: Apply over primary dressing as directed. 1. In office sharp debridement 2. Silver alginate Medihoney 3. Aggressive offloadingoosurgical [name] with Pegasys Electronic Signature(s) Signed: 02/25/2022 12:38:41 PM By: Holly Shan DO Entered By: [name] on 02/25/2022 12:38:10 -------------------------------------------------------------------------------- HxROS Details Patient Name: Date of Service: [name], [name]. 02/25/2022 11:00 [name]: [id number] Patient  Account Number: [id number] Date of Birth/Sex: Treating RN: 1950/06/26 (71 y.o. F) Primary Care Provider: Harlan [name] Other Clinician: Referring Provider: Treating Provider/Extender: [name] in Treatment: [address], [place] ([id number]) [id number] Page 9 of 11 Information Obtained From Patient Constitutional Symptoms (General Health) Medical History: Past Medical History Notes: morbid obesity Eyes Medical History: Positive for: Cataracts Negative for: Glaucoma; Optic Neuritis Ear/Nose/Mouth/Throat Medical History: Negative for: Chronic sinus problems/congestion; Middle ear problems Hematologic/Lymphatic Medical History: Negative for: Anemia; Hemophilia; Human Immunodeficiency Virus; Lymphedema; Sickle Cell Disease Respiratory Medical History: Positive for: Sleep Apnea Negative for: Aspiration; Asthma; Chronic Obstructive Pulmonary Disease (COPD); Pneumothorax; Tuberculosis Cardiovascular Medical History: Positive for: Hypertension Negative for: Angina; Arrhythmia; Congestive Heart Failure; Coronary Artery Disease; Deep Vein Thrombosis; Hypotension; Myocardial Infarction; Peripheral Arterial Disease; Peripheral Venous Disease; Phlebitis;  Vasculitis Past Medical History Notes: Takotsubo cardiomyopathy 2012, hyperlipidemia, Hx CVA, Tachycardia Gastrointestinal Medical History: Negative for: Cirrhosis ; Colitis; Crohns; Hepatitis [name]; Hepatitis B; Hepatitis C Endocrine Medical History: Positive for: Type II Diabetes Negative for: Type I Diabetes Time with diabetes: over 20 years Treated with: Insulin, Oral agents Blood sugar tested every day: Yes Tested : daily Genitourinary Medical History: Negative for: End Stage Renal Disease Past Medical History Notes: CKD stage 3 Immunological Medical History: Negative for: Lupus Erythematosus; Raynauds; Scleroderma Integumentary (Skin) Medical History: Negative for: History of Burn Musculoskeletal Medical History: Positive for: Osteoarthritis; Osteomyelitis - Hx in 2007 (left 3rd toe) Negative for: Gout; Rheumatoid Arthritis Past Medical History Notes: left leg weakness Fulwider, Draya [name] ([id number]) [id number] Page 10 of 11 Neurologic Medical History: Positive for: Neuropathy Negative for: Dementia; Quadriplegia; Paraplegia; Seizure Disorder Past Medical History Notes: CVA Oncologic Medical History: Positive for: Received Radiation - 03/2020 Negative for: Received Chemotherapy Past Medical History Notes: hx endometrial cancer, left breast CA Psychiatric Medical History: Positive for: Confinement Anxiety Negative for: Anorexia/bulimia Past Medical History Notes: depression HBO Extended History Items Eyes: Cataracts Immunizations Pneumococcal Vaccine: Received Pneumococcal Vaccination: Yes Received Pneumococcal Vaccination On or After 60th Birthday: Yes Implantable Devices None Hospitalization / Surgery History Type of Hospitalization/Surgery Amputation of right great toe Tendon repair Hysterectomy Osteomyelitis left 3rd toe cervical fusion with cage left breast lumpectomy Family and Social History Cancer: Yes - Father;  Diabetes: No; Heart Disease: No; Hereditary Spherocytosis: No; Hypertension: Yes - Mother,Father,Siblings; Kidney Disease: No; Lung Disease: No; Seizures: No; Stroke: Yes - Mother,Father; Thyroid Problems: No; Tuberculosis: No; Never smoker; Marital Status - Single; Alcohol Use: Never; Drug Use: No History; Caffeine Use: Rarely; Financial Concerns: No; Food, Clothing or Shelter Needs: No; Support System Lacking: No; Transportation Concerns: No Electronic Signature(s) Signed: 02/25/2022 12:38:41 PM By: Holly Shan DO Entered By: [name] on 02/25/2022 12:36:01 -------------------------------------------------------------------------------- SuperBill Details Patient Name: Date of Service: [name], [name]. 02/25/2022 Medical Record Number: [id number] Patient Account Number: [id number] Date of Birth/Sex: Treating RN: 04-22-1950 (71 y.o. Debby Bud Primary Care Provider: Harlan [name] Other Clinician: Referring Provider: Treating Provider/Extender: [name] Weeks in Treatment: [address] Diagnosis Coding ICD-10 Codes [name], [name] ([id number]) [id number] Page 11 of 11 Code Description 609 302 2033 Non-pressure chronic ulcer of other part of left foot with fat layer exposed E11.621 Type 2 diabetes mellitus with foot ulcer E11.42 Type 2 diabetes mellitus with diabetic polyneuropathy Facility Procedures : CPT4 Code: 23953202 Description: 33435 - DEB SUBQ TISSUE 20 SQ CM/< ICD-10 Diagnosis Description L97.522 Non-pressure chronic ulcer of other part of left foot with fat layer exposed Modifier: Quantity: 1 Physician Procedures : WYS1  Code Description Modifier [id number] - WC PHYS SUBQ TISS 20 SQ CM ICD-10 Diagnosis Description L97.522 Non-pressure chronic ulcer of other part of left foot with fat layer exposed Quantity: 1 Electronic Signature(s) Signed: 02/25/2022 12:38:41 PM By: Holly Shan DO Entered By: [name] on 02/25/2022 12:38:22

## 2022-03-02 NOTE — Progress Notes (Signed)
[name], [name] [id number] Page 1 of 8 Visit Report for 02/25/2022 Arrival Information Details Patient Name: Date of Service: [name]. 02/25/2022 11:00 [name] Patient Account Number: [id number] Date of Birth/Sex: Treating RN: Aug 09, 1950 (71 y.o. F) Primary Care Para Cossey: [name] Other Clinician: Referring Alvan Culpepper: Treating Helyn Schwan/Extender: [name] in Treatment: 18 Visit Information History Since Last Visit Added or deleted any medications: No Patient Arrived: Cane Any new allergies or adverse reactions: No Arrival Time: 11:12 Had [name] fall or experienced change in No Accompanied By: self activities of daily living that may affect Transfer Assistance: None risk of falls: Patient Identification Verified: Yes Signs or symptoms of abuse/neglect since last visito No Secondary Verification Process Completed: Yes Hospitalized since last visit: No Patient Requires Transmission-Based Precautions: No Has Dressing in Place as Prescribed: Yes Patient Has Alerts: Yes Pain Present Now: No Patient Alerts: ABI's R=1.07 L=1.02 Electronic Signature(s) Signed: 03/02/2022 12:00:46 PM By: Erenest Blank Entered By: Erenest Blank on 02/25/2022 11:13:20 -------------------------------------------------------------------------------- Encounter Discharge Information Details Patient Name: Date of Service: [name]. 02/25/2022 11:00 [name]: [id number] Patient Account Number: [id number] Date of Birth/Sex: Treating RN: Jun 16, 1950 (71 y.o. [name], Tammi Klippel Primary Care Selicia Windom: [name] Other Clinician: Referring Malita Ignasiak: Treating Maritza Goldsborough/Extender: [name] in Treatment: 18 Encounter Discharge Information Items Post Procedure Vitals Discharge Condition: Stable Temperature (F): 98 Ambulatory Status: Cane Pulse (bpm):  84 Discharge Destination: Home Respiratory Rate (breaths/min): 20 Transportation: Private Auto Blood Pressure (mmHg): 121/71 Accompanied By: self Schedule Follow-up Appointment: Yes Clinical Summary of Care: Electronic Signature(s) Signed: 02/25/2022 3:28:23 PM By: Deon Pilling RN, BSN Entered By: Deon Pilling on 02/25/2022 11:46:56 [place], [place] [name] ([id number]) [id number] Page 2 of 8 -------------------------------------------------------------------------------- Lower Extremity Assessment Details Patient Name: Date of Service: [name]. 02/25/2022 11:00 [name] Patient Account Number: [id number] Date of Birth/Sex: Treating RN: [birth year] (71 y.o. F) Primary Care Ninfa Giannelli: [name] Other Clinician: Referring Areeb Corron: Treating Suellen Durocher/Extender: [name] Weeks in Treatment: 18 Edema Assessment Assessed: [Left: No] [Right: No] Edema: [Left: N] [Right: o] Calf Left: Right: Point of Measurement: 34 cm From Medial Instep 46 cm Ankle Left: Right: Point of Measurement: 9 cm From Medial Instep 29 cm Electronic Signature(s) Signed: 03/02/2022 12:00:46 PM By: Erenest Blank Entered By: Erenest Blank on 02/25/2022 11:18:18 -------------------------------------------------------------------------------- Multi Wound Chart Details Patient Name: Date of Service: [name]. 02/25/2022 11:00 [name]: [id number] Patient Account Number: [id number] Date of Birth/Sex: Treating RN: May 24, 1950 (71 y.o. F) Primary Care Bernardina Cacho: [name] Other Clinician: Referring Jacquis Paxton: Treating Madason Rauls/Extender: [name] in Treatment: 18 Vital Signs Height(in): Pulse(bpm): 49 Weight(lbs): Blood Pressure(mmHg): 121/71 Body Mass Index(BMI): Temperature(F): 98 Respiratory Rate(breaths/min): 18 [4:Photos:] [N/[name]:N/[name]] Left, Plantar Metatarsal  head first N/[name] N/[name] Wound Location: Gradually Appeared N/[name] N/[name] Wounding Event: Diabetic Wound/Ulcer of the Lower N/[name] N/[name] Primary Etiology: Extremity Cataracts, Sleep Apnea, Hypertension, N/[name] N/[name] Comorbid History: Type II Diabetes, Osteoarthritis, Osteomyelitis, Neuropathy, Received Radiation, Confinement Anxiety 10/02/2020 N/[name] N/[name] Date Acquired: 5 N/[name] N/[name] Weeks of Treatment: Open N/[name] N/[name] Wound Status: No N/[name] N/[name] Wound Recurrence: 0.6x0.2x0.2 N/[name] N/[name] Measurements L x W x D (cm) [name] ([id number]) [id number] Page 3 of 8 0.094 N/[name] N/[name] [name] (cm) : rea 0.019 N/[name] N/[name] Volume (cm) : 77.00% N/[name] N/[name] % Reduction in [name] rea:  84.60% N/[name] N/[name] % Reduction in Volume: Grade 1 N/[name] N/[name] Classification: Medium N/[name] N/[name] Exudate [name] mount: Serosanguineous N/[name] N/[name] Exudate Type: red, brown N/[name] N/[name] Exudate Color: Distinct, outline attached N/[name] N/[name] Wound Margin: Large (67-100%) N/[name] N/[name] Granulation [name] mount: Pink, Pale N/[name] N/[name] Granulation Quality: None Present (0%) N/[name] N/[name] Necrotic [name] mount: Fat Layer (Subcutaneous Tissue): Yes N/[name] N/[name] Exposed Structures: Fascia: No Tendon: No Muscle: No Joint: No Bone: No Large (67-100%) N/[name] N/[name] Epithelialization: Debridement - Excisional N/[name] N/[name] Debridement: Pre-procedure Verification/Time Out 11:40 N/[name] N/[name] Taken: Lidocaine 4% Topical Solution N/[name] N/[name] Pain Control: Callus, Subcutaneous, Slough N/[name] N/[name] Tissue Debrided: Skin/Subcutaneous Tissue N/[name] N/[name] Level: 4 N/[name] N/[name] Debridement [name] (sq cm): rea Curette N/[name] N/[name] Instrument: Minimum N/[name] N/[name] Bleeding: Pressure N/[name] N/[name] Hemostasis [name] chieved: 0 N/[name] N/[name] Procedural Pain: 0 N/[name] N/[name] Post Procedural Pain: Procedure was tolerated well N/[name] N/[name] Debridement Treatment Response: 0.6x0.2x0.2 N/[name] N/[name] Post Debridement Measurements L x W x D (cm) 0.019 N/[name] N/[name] Post Debridement Volume: (cm) Callus: Yes N/[name] N/[name] Periwound Skin Texture: Excoriation: No Induration:  No Crepitus: No Rash: No Scarring: No Maceration: No N/[name] N/[name] Periwound Skin Moisture: Dry/Scaly: No Atrophie Blanche: No N/[name] N/[name] Periwound Skin Color: Cyanosis: No Ecchymosis: No Erythema: No Hemosiderin Staining: No Mottled: No Pallor: No Rubor: No No Abnormality N/[name] N/[name] Temperature: Debridement N/[name] N/[name] Procedures Performed: Treatment Notes Wound #4 (Metatarsal head first) Wound Laterality: Plantar, Left Cleanser Soap and Water Discharge Instruction: May shower and wash wound with dial antibacterial soap and water prior to dressing change. Wound Cleanser Discharge Instruction: Cleanse the wound with wound cleanser prior to applying [name] clean dressing using gauze sponges, not tissue or cotton balls. Peri-Wound Care Skin Prep Discharge Instruction: Use skin prep as directed Topical Primary Dressing MediHoney Gel, tube 1.5 (oz) Discharge Instruction: Apply until Regranex obtained Optifoam Non-Adhesive Dressing, 4x4 in Discharge Instruction: Apply as donut around wound KerraCel Ag Gelling Fiber Dressing, 4x5 in (silver alginate) Discharge Instruction: Apply silver alginate to wound bed as instructed Secondary Dressing ALLEVYN Gentle Border, 3x3 (in/in) Discharge Instruction: Apply over primary dressing as directed. EVELLYN, TUFF [name] ([id number]) [id number] Page 4 of 8 Secured With Compression Wrap Compression Stockings Environmental education officer) Signed: 02/25/2022 12:38:41 PM By: [name] DO Entered By: [name] on 02/25/2022 12:34:36 -------------------------------------------------------------------------------- Multi-Disciplinary Care Plan Details Patient Name: Date of Service: [name]. 02/25/2022 11:00 [name]: [id number] Patient Account Number: [id number] Date of Birth/Sex: Treating RN: 28-Jul-1950 (71 y.o. [name], Tammi Klippel Primary Care Retal Tonkinson: [name] Other Clinician: Referring  Muneeb Veras: Treating Karleigh Bunte/Extender: [name] in Treatment: 5 Active Inactive Nutrition Nursing Diagnoses: Impaired glucose control: actual or potential Goals: Patient/caregiver verbalizes understanding of need to maintain therapeutic glucose control per primary care physician Date Initiated: 10/21/2021 Target Resolution Date: 02/19/2022 Goal Status: Active Interventions: Assess HgA1c results as ordered upon admission and as needed Provide education on elevated blood sugars and impact on wound healing Notes: Wound/Skin Impairment Nursing Diagnoses: Impaired tissue integrity Goals: Patient/caregiver will verbalize understanding of skin care regimen Date Initiated: 10/21/2021 Target Resolution Date: 02/19/2022 Goal Status: Active Ulcer/skin breakdown will have [name] volume reduction of 30% by week 4 Date Initiated: 10/21/2021 Date Inactivated: 11/29/2021 Target Resolution Date: 11/18/2021 Goal Status: Met Interventions: Assess patient/caregiver ability to obtain necessary supplies Assess patient/caregiver ability to perform ulcer/skin care regimen upon admission and as needed Assess ulceration(s) every visit Provide education on ulcer and skin care Treatment Activities: Topical wound  management initiated : 10/21/2021 Notes: 11/29/21: Wound care regimen continues. Running IVR for skin sub Electronic Signature(s) ALVINE, MOSTAFA [name] ([id number]) [id number] Page 5 of 8 Signed: 02/25/2022 3:28:23 PM By: Deon Pilling RN, BSN Entered By: Deon Pilling on 02/25/2022 11:45:51 -------------------------------------------------------------------------------- Pain Assessment Details Patient Name: Date of Service: Holly Rolling, [name] RA H [name]. 02/25/2022 11:00 [name]: [id number] Patient Account Number: [id number] Date of Birth/Sex: Treating RN: [birth year] (71 y.o. F) Primary Care Sheleen Conchas: [name] Other Clinician: Referring  Darel Ricketts: Treating Erving Sassano/Extender: [name] in Treatment: 18 Active Problems Location of Pain Severity and Description of Pain Patient Has Paino No Site Locations Pain Management and Medication Current Pain Management: Electronic Signature(s) Signed: 03/02/2022 12:00:46 PM By: Erenest Blank Entered By: Erenest Blank on 02/25/2022 11:13:54 -------------------------------------------------------------------------------- Patient/Caregiver Education Details Patient Name: Date of Service: [name]. 12/8/2023andnbsp11:00 [name]: [id number] Patient Account Number: [id number] Date of Birth/Gender: Treating RN: 1951/01/20 (71 y.o. [name], Tammi Klippel Primary Care Physician: [name] Other Clinician: Referring Physician: Treating Physician/Extender: [name] in Treatment: [phone] Education Assessment Education Provided To: Patient KEIMORA, SWARTOUT [name] ([id number]) [id number] Page 6 of 8 Education Topics Provided Wound/Skin Impairment: Handouts: Skin Care Do's and Dont's Methods: Explain/Verbal Responses: Reinforcements needed Electronic Signature(s) Signed: 02/25/2022 3:28:23 PM By: Deon Pilling RN, BSN Entered By: Deon Pilling on 02/25/2022 11:46:04 -------------------------------------------------------------------------------- Wound Assessment Details Patient Name: Date of Service: [name]. 02/25/2022 11:00 [name]: [id number] Patient Account Number: [id number] Date of Birth/Sex: Treating RN: 04/29/1950 (71 y.o. F) Primary Care Kahlyn Shippey: [name] Other Clinician: Referring [place]le[place] Lemaster: Treating Anastasya Jewell/Extender: [name] Weeks in Treatment: 18 Wound Status Wound Number: 4 Primary Diabetic Wound/Ulcer of the Lower Extremity Etiology: Wound Location: Left, Plantar Metatarsal head first Wound Open Wounding  Event: Gradually Appeared Status: Date Acquired: 10/02/2020 Comorbid Cataracts, Sleep Apnea, Hypertension, Type II Diabetes, Weeks Of Treatment: 18 History: Osteoarthritis, Osteomyelitis, Neuropathy, Received Radiation, Clustered Wound: No Confinement Anxiety Photos Wound Measurements Length: (cm) 0.6 Width: (cm) 0.2 Depth: (cm) 0.2 Area: (cm) 0.094 Volume: (cm) 0.019 % Reduction in Area: 77% % Reduction in Volume: 84.6% Epithelialization: Large (67-100%) Tunneling: No Undermining: No Wound Description Classification: Grade 1 Wound Margin: Distinct, outline attached Exudate Amount: Medium Exudate Type: Serosanguineous Exudate Color: red, brown Foul Odor After Cleansing: No Slough/Fibrino No Wound Bed Granulation Amount: Large (67-100%) Exposed Structure Granulation Quality: Pink, Pale Fascia Exposed: No Necrotic Amount: None Present (0%) Fat Layer (Subcutaneous Tissue) Exposed: Yes Tendon Exposed: No Muscle Exposed: No Joint Exposed: No Bone Exposed: No Andujo, [place] [name] ([id number]) [id number] Page 7 of 8 Periwound Skin Texture Texture Color No Abnormalities Noted: No No Abnormalities Noted: No Callus: Yes Atrophie Blanche: No Crepitus: No Cyanosis: No Excoriation: No Ecchymosis: No Induration: No Erythema: No Rash: No Hemosiderin Staining: No Scarring: No Mottled: No Pallor: No Moisture Rubor: No No Abnormalities Noted: No Dry / Scaly: No Temperature / Pain Maceration: No Temperature: No Abnormality Treatment Notes Wound #4 (Metatarsal head first) Wound Laterality: Plantar, Left Cleanser Soap and Water Discharge Instruction: May shower and wash wound with dial antibacterial soap and water prior to dressing change. Wound Cleanser Discharge Instruction: Cleanse the wound with wound cleanser prior to applying [name] clean dressing using gauze sponges, not tissue or cotton balls. Peri-Wound Care Skin Prep Discharge Instruction:  Use skin prep as directed Topical Primary Dressing MediHoney Gel, tube 1.5 (oz) Discharge Instruction: Apply until  Regranex obtained Optifoam Non-Adhesive Dressing, 4x4 in Discharge Instruction: Apply as donut around wound KerraCel Ag Gelling Fiber Dressing, 4x5 in (silver alginate) Discharge Instruction: Apply silver alginate to wound bed as instructed Secondary Dressing ALLEVYN Gentle Border, 3x3 (in/in) Discharge Instruction: Apply over primary dressing as directed. Secured With Compression Wrap Compression Stockings Environmental education officer) Signed: 03/02/2022 12:00:46 PM By: Erenest Blank Entered By: Erenest Blank on 02/25/2022 11:19:46 -------------------------------------------------------------------------------- Vitals Details Patient Name: Date of Service: [name]. 02/25/2022 11:00 [name]: [id number] Patient Account Number: [id number] Date of Birth/Sex: Treating RN: 12-Jan-1951 (71 y.o. F) Primary Care Nimsi Males: [name] Other Clinician: Referring Shadd Dunstan: Treating Mariela Rex/Extender: [name] Weeks in Treatment: 18 Vital Signs Time Taken: 11:13 Temperature (F): [place], Aubrey [name] ([id number]) [phone].pdf Page 8 of 8 Pulse (bpm): 84 Respiratory Rate (breaths/min): 18 Blood Pressure (mmHg): 121/71 Reference Range: 80 - 120 mg / dl Electronic Signature(s) Signed: 03/02/2022 12:00:46 PM By: Erenest Blank Entered By: Erenest Blank on 02/25/2022 11:13:47

## 2022-03-03 DIAGNOSIS — H34831 Tributary (branch) retinal vein occlusion, right eye, with macular edema: Secondary | ICD-10-CM | POA: Diagnosis not present

## 2022-03-07 ENCOUNTER — Encounter (HOSPITAL_BASED_OUTPATIENT_CLINIC_OR_DEPARTMENT_OTHER): Payer: PPO | Admitting: Internal Medicine

## 2022-03-07 DIAGNOSIS — E11621 Type 2 diabetes mellitus with foot ulcer: Secondary | ICD-10-CM | POA: Diagnosis not present

## 2022-03-07 DIAGNOSIS — L97522 Non-pressure chronic ulcer of other part of left foot with fat layer exposed: Secondary | ICD-10-CM

## 2022-03-09 DIAGNOSIS — F3341 Major depressive disorder, recurrent, in partial remission: Secondary | ICD-10-CM | POA: Diagnosis not present

## 2022-03-09 DIAGNOSIS — E1121 Type 2 diabetes mellitus with diabetic nephropathy: Secondary | ICD-10-CM | POA: Diagnosis not present

## 2022-03-09 DIAGNOSIS — E785 Hyperlipidemia, unspecified: Secondary | ICD-10-CM | POA: Diagnosis not present

## 2022-03-09 DIAGNOSIS — I129 Hypertensive chronic kidney disease with stage 1 through stage 4 chronic kidney disease, or unspecified chronic kidney disease: Secondary | ICD-10-CM | POA: Diagnosis not present

## 2022-03-10 NOTE — Progress Notes (Signed)
Holly, BERGEVIN [name] ([id number]) [id number] Page 1 of 11 Visit Report for 03/07/2022 Chief Complaint Document Details Patient Name: Date of Service: [name] 03/07/2022 12:45 PM Medical Record Number: [id number] Patient Account Number: [id number] Date of Birth/Sex: Treating RN: 1950/08/15 (71 y.o. F) Primary Care Provider: [name] Other Clinician: Referring Provider: Treating Provider/Extender: [name] in Treatment: 19 Information Obtained from: Patient Chief Complaint Left plantar ulcer 11/12/2019; patient is here referred by podiatry for review of [name] wound on the left first plantar metatarsal head 03/30/2021; right second toe wound s/p Trauma 10/21/2021; First left metatarsal head wound Electronic Signature(s) Signed: 03/07/2022 3:43:30 PM By: [name] DO Entered By: [name] on 03/07/2022 13:01:39 -------------------------------------------------------------------------------- Debridement Details Patient Name: Date of Service: [name], [name] 03/07/2022 12:45 PM Medical Record Number: [id number] Patient Account Number: [id number] Date of Birth/Sex: Treating RN: 1950/08/28 (70 y.o. [name], [name] Primary Care Provider: [name] Other Clinician: Referring Provider: Treating Provider/Extender: [name] in Treatment: 19 Debridement Performed for Assessment: Wound #4 Left,Plantar Metatarsal head first Performed By: Physician [name], DO Debridement Type: Debridement Severity of Tissue Pre Debridement: Fat layer exposed Level of Consciousness (Pre-procedure): Awake and Alert Pre-procedure Verification/Time Out Yes - 12:54 Taken: Start Time: 12:54 Pain Control: Lidocaine T Area Debrided (L x W): otal 0.8 (cm) x 0.5 (cm) = 0.4 (cm) Tissue and other material debrided: Viable, Non-Viable, Callus, Slough, Subcutaneous, Slough Level:  Skin/Subcutaneous Tissue Debridement Description: Excisional Instrument: Curette Bleeding: Minimum Hemostasis Achieved: Pressure End Time: 12:54 Procedural Pain: 0 Post Procedural Pain: 0 Response to Treatment: Procedure was tolerated well Level of Consciousness (Post- Awake and Alert procedure): Post Debridement Measurements of Total Wound Length: (cm) 0.8 Width: (cm) 0.5 Depth: (cm) 0.2 Volume: (cm) 0.063 [name], [name] ([id number]) [id number] Page 2 of 11 Character of Wound/Ulcer Post Debridement: Improved Severity of Tissue Post Debridement: Fat layer exposed Post Procedure Diagnosis Same as Pre-procedure Electronic Signature(s) Signed: 03/07/2022 3:43:30 PM By: [name] DO Signed: 03/09/2022 5:39:23 PM By: Rhae Hammock RN Entered By: Rhae Hammock on 03/07/2022 12:55:43 -------------------------------------------------------------------------------- HPI Details Patient Name: Date of Service: [name], [name] 03/07/2022 12:45 PM Medical Record Number: [id number] Patient Account Number: [id number] Date of Birth/Sex: Treating RN: [birth year] (71 y.o. F) Primary Care Provider: [name] Other Clinician: Referring Provider: Treating Provider/Extender: [name] in Treatment: 19 History of Present Illness HPI Description: 05/10/17 patient presents today for initial evaluation and our clinic concerning issues that she has been having with an ulceration on the plantar aspect of the left first metatarsal head. She has been seen [name] for this since August 2018. Subsequently he has recommended bunion surgery as that seems to be the culprit for why there is pressure and friction occurring at the site causing the callous and subsequently the wound. 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 With that being said I do have notes from several of his visits one of which does include an x-ray documentation stating that she had [name] negative x-ray. There was obviously the bunion the notes I have extended from 11 deformity. With that being said she has not had an MRI fortunately this wound does not probe to bone. The notes I have extended from February 08, 2017 through April 14, 2017. During that course she was also placed on antibiotics to help with an infection two weeks ago and this was doxycycline. Fortunately that seems to have completely resolved any infection issues that she may have had. Unfortunately this is an ulcer that she has actually been dealing with since April 2018. She just initially was trying to treat it and care for it on her own. Her most recent hemoglobin A1c was 5.8, she is [name] non-smoker, she does have [name] right first toe amputation and [name] left third toe amputation. Fortunately she's not having any discomfort at the site. 05/17/17 on evaluation today patient's ulcer on the plantar aspect of the first metatarsal region appears to be close at this point. There does not appear to be any evidence of ulcer opening. This is on initial inspection. She has not really had any discomfort but honestly she really was not experiencing [name] lot of pain even before. There is no evidence of infection or fluctuation under the callous which is present at this point. ADMISSION 11/12/2019 This is [name] 71 year old woman that we previously had in this clinic in 2017 for two visits with [name] wound I think roughly in the same area. At that point she had [name] bunion in the left. She has apparently had surgery on this since then. Looking through care everywhere we can see that she has had follow-up with podiatry from 06/12/2019 through 10/28/2019 for [name] wound on the plantar aspect of her left first MTP. She states when this started she was having severe knee pain on  the right which forced her to alter her gait. She has recently received doxycycline which she is finished and Bactroban. She is back to using Medihoney on the wound. She is offloading with the Pegasys shoe. Past medical history includes type 2 diabetes with [name] recent hemoglobin A1c of 6.8, severe type II diabetic neuropathy, history of [name] CVA, stage III chronic renal failure hypertension, depression, of bilateral previous bunion surgery, right first toe amputation and [name] partial left third toe amputation for osteomyelitis. The patient states that she recently had work to do in her mother's house in [place] and so she was on her feet quite [name] lot which delayed the healing. ABI in our clinic was 1.1 on the left 8/31; plantar aspect of her left first metatarsal head. Using silver collagen. This is probably [name] surgical wound 9/14; this is [name] patient who had [name] wound on the plantar aspect of her left first metatarsal head in the setting of [name] significant bunion deformity. We use cervical collagen and [name] offloading shoe. She has significant diabetic neuropathy probably some degree of [name] Charcot deformity in her feet. This is closed over today. Readmission 03/30/2021 Ms. Jameka Ivie is [name] 71 year old female with [name] past medical history of insulin-dependent controlled type 2 diabetes s/p right great toe amputation, right knee osteoarthritis and hypertension that presents to the clinic for [name] second right toe wound. She states she was getting out of the bathtub when her foot got caught the tub and the skin to her second toe was split open. She noticed this after the bath when she saw blood on the floor. She has neuropathy in her feet. She visited the ED following the event on 03/26/2021. They obtained x-rays that showed [name] fracture to the fifth toe but no fracture to the second toe. She was started on doxycycline. She currently denies signs of infection. She has been keeping the area covered with [name] Band-Aid. Readmission  10/21/2021 Ms. Toni Mckelvy is [name] 71 year old female with [name] past medical history of insulin dependent controlled type 2 diabetes complicated by peripheral neuropathy that presents to the clinic for [name] left met head wound. She has been following with Dr. Earleen Newport, podiatry for over [name] year for this issue. She has tried collagen, mupirocin ointment and Medihoney. She is at high fall risk and cannot tolerate [name] total contact cast. She has orthotics with specialized inserts for her current issue. She currently denies signs of infection. 8/10; patient presents for follow-up. Unfortunately Regranex was unaffordable at $1200. She has been using Medihoney to the wound bed. She reports aggressively offloading the area. She has offloading inserts. She denies signs of infection. 8/22; patient presents for follow-up. She has been using Medihoney to the wound bed with improvement in wound healing. She has no issues or complaints today. She will be out of town starting next week for the next 2 weeks. JAYLENNE, HAMELIN [name] ([id number]) [id number] Page 3 of 11 9/11; patient presents for follow-up. She has been using Medihoney to the wound bed. She went on vacation and was last seen 3 weeks ago. She states she had tried to aggressively offload the wound bed. 9/26; patient presents for follow-up. She has been using Medihoney to the wound bed. She has no issues or complaints today. 10/13; patient presents for follow-up. She has been using Medihoney to the wound bed. She has no issues or complaints today. We rediscussed the possibility of [name] total contact cast but she does not feel comfortable as she is [name] high fall risk and lives alone. 10/26; patient presents for follow-up. Has been using Medihoney to the wound bed. She has been approved for Grafix however has [name] 20% co-pay. Patient declined proceeding with this due to financial cost. Patient has no issues or complaints today. 11/16; patient presents for  follow-up. She has been using Medihoney to the wound bed. She has been using her diabetic shoes with an offloading foam donut. She declines total contact cast placement today. 11/30; patient presents for follow-up. She has been using Medihoney to the wound bed. She did not receive silver alginate. She has been using the Pegasys with surgical shoe. There has been improvement in wound healing. 12/8; patient presents for follow-up. She has been using Medihoney and silver alginate to the wound bed. She has been using the Pegasys with surgical shoe without issues. 12/18; patient presents for follow-up. She has been using Medihoney and silver alginate to the wound bed. She continues to use her Pegasys with [name] surgical shoe. She has no issues or complaints today. Electronic Signature(s) Signed: 03/07/2022 3:43:30 PM By: [name] DO Entered By: [name] on 03/07/2022 13:02:11 -------------------------------------------------------------------------------- Physical Exam Details Patient Name: Date of Service: Holly Conroy RA H [name]. 03/07/2022 12:45 PM Medical Record Number: [id number] Patient Account Number: [id number] Date of Birth/Sex: Treating RN: December 18, 1950 (71 y.o. F) Primary Care Provider: [name] Other Clinician: Referring Provider: Treating Provider/Extender: [name] in Treatment: 19 Constitutional respirations regular, non-labored and within target range for patient.. Cardiovascular 2+ dorsalis pedis/posterior tibialis pulses. Psychiatric pleasant and cooperative. Notes : Left foot: T the left first metatarsal there is an open wound with granulation tissue, nonviable tissue and callus. Postdebridement there is healthy granulation o tissue. No surrounding signs of infection. Electronic Signature(s) Signed: 03/07/2022 3:43:30 PM By: [name] DO Entered By: [name] on 03/07/2022  13:02:39 -------------------------------------------------------------------------------- Physician Orders Details Patient Name: Date of Service: [name], DEBO  RA H [name]. 03/07/2022 12:45 PM Medical Record Number: [id number] Patient Account Number: [id number] Date of Birth/Sex: Treating RN: 1950/06/26 (71 y.o. [name], [name] Primary Care Provider: [name] Other Clinician: Referring Provider: Treating Provider/Extender: [name] in Treatment: [address], [place] [name] ([id number]) [id number] Page 4 of 11 Verbal / Phone Orders: No Diagnosis Coding Follow-up Appointments ppointment in 1 week. - Dr. Heber [place] Tuesday Return [name] ppointment in 2 weeks. - Dr. Heber [place] Tuesday Return [name] Other: - Prism=Supplies Anesthetic (In clinic) Topical Lidocaine 5% applied to wound bed (In clinic) Topical Lidocaine 4% applied to wound bed Cellular or Tissue Based Products Cellular or Tissue Based Product Type: - IVR for Grafix IVR for [employer]=$225 Copay Bathing/ Shower/ Hygiene May shower and wash wound with soap and water. Edema Control - Lymphedema / SCD / Other Avoid standing for long periods of time. Off-Loading Other: - Diabetic Shoes Relieve pressure/time standing of feet ***Use surgical shoe with peg assist insert with cutout for wound for offloading*** Additional Orders / Instructions Follow Nutritious Diet - Monitor/Control Blood Sugars Non Wound Condition Other Non Wound Condition Orders/Instructions: - Apply corn pad or gauze between great toe and 2nd toe. Wound Treatment Wound #4 - Metatarsal head first Wound Laterality: Plantar, Left Cleanser: Soap and Water 1 x Per Day/30 Days Discharge Instructions: May shower and wash wound with dial antibacterial soap and water prior to dressing change. Cleanser: Wound Cleanser (Generic) 1 x Per Day/30 Days Discharge Instructions: Cleanse the wound with wound cleanser prior to applying  [name] clean dressing using gauze sponges, not tissue or cotton balls. Peri-Wound Care: Skin Prep (Generic) 1 x Per Day/30 Days Discharge Instructions: Use skin prep as directed Prim Dressing: MediHoney Gel, tube 1.5 (oz) 1 x Per Day/30 Days ary Discharge Instructions: Apply until Regranex obtained Prim Dressing: Optifoam Non-Adhesive Dressing, 4x4 in (Generic) 1 x Per Day/30 Days ary Discharge Instructions: Apply as donut around wound Prim Dressing: KerraCel Ag Gelling Fiber Dressing, 4x5 in (silver alginate) (Generic) 1 x Per Day/30 Days ary Discharge Instructions: Apply silver alginate to wound bed as instructed Secondary Dressing: ALLEVYN Gentle Border, 3x3 (in/in) (Generic) 1 x Per Day/30 Days Discharge Instructions: Apply over primary dressing as directed. Electronic Signature(s) Signed: 03/07/2022 3:43:30 PM By: [name] DO Entered By: [name] on 03/07/2022 13:02:47 -------------------------------------------------------------------------------- Problem List Details Patient Name: Date of Service: [name], [name] 03/07/2022 12:45 PM Medical Record Number: [id number] Patient Account Number: [id number] Date of Birth/Sex: Treating RN: 1950/10/28 (71 y.o. F) Primary Care Provider: [name] Other Clinician: Jule Ser ([id number]) [id number] Page 5 of 11 Referring Provider: Treating Provider/Extender: [name] in Treatment: 19 Active Problems ICD-10 Encounter Code Description Active Date MDM Diagnosis L97.522 Non-pressure chronic ulcer of other part of left foot with fat layer exposed 10/21/2021 No Yes E11.621 Type 2 diabetes mellitus with foot ulcer 10/21/2021 No Yes E11.42 Type 2 diabetes mellitus with diabetic polyneuropathy 10/21/2021 No Yes Inactive Problems Resolved Problems Electronic Signature(s) Signed: 03/07/2022 3:43:30 PM By: [name] DO Entered By: [name] on  03/07/2022 13:01:26 -------------------------------------------------------------------------------- Progress Note Details Patient Name: Date of Service: [name], [name] 03/07/2022 12:45 PM Medical Record Number: [id number] Patient Account Number: [id number] Date of Birth/Sex: Treating RN: 06/29/1950 (71 y.o. F) Primary Care Provider: [name] Other Clinician: Referring Provider: Treating Provider/Extender: [name] in Treatment: 43 Subjective Chief Complaint Information obtained from Patient Left plantar ulcer 11/12/2019; patient is here referred  by podiatry for review of [name] wound on the left first plantar metatarsal head 03/30/2021; right second toe wound s/p Trauma 10/21/2021; First left metatarsal head wound History of Present Illness (HPI) 05/10/17 patient presents today for initial evaluation and our clinic concerning issues that she has been having with an ulceration on the plantar aspect of the left first metatarsal head. She has been seen [name] for this since August 2018. Subsequently he has recommended bunion surgery as that seems to be the culprit for why there is pressure and friction occurring at the site causing the callous and subsequently the wound. 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 With that being said I do have notes from several of his visits one of which does include an x-ray documentation stating that she had [name] negative x-ray. There was obviously the bunion the notes I have extended from 11 deformity. With that being said she has not had an MRI fortunately this wound does not probe to bone. The notes I have extended from February 08, 2017 through April 14, 2017. During that course she was also placed on antibiotics to help with an infection two weeks ago and this was  doxycycline. Fortunately that seems to have completely resolved any infection issues that she may have had. Unfortunately this is an ulcer that she has actually been dealing with since April 2018. She just initially was trying to treat it and care for it on her own. Her most recent hemoglobin A1c was 5.8, she is [name] non-smoker, she does have [name] right first toe amputation and [name] left third toe amputation. Fortunately she's not having any discomfort at the site. 05/17/17 on evaluation today patient's ulcer on the plantar aspect of the first metatarsal region appears to be close at this point. There does not appear to be any evidence of ulcer opening. This is on initial inspection. She has not really had any discomfort but honestly she really was not experiencing [name] lot of pain even before. There is no evidence of infection or fluctuation under the callous which is present at this point. ADMISSION 11/12/2019 This is [name] 71 year old woman that we previously had in this clinic in 2017 for two visits with [name] wound I think roughly in the same area. At that point she had [name] bunion in the left. She has apparently had surgery on this since then. Looking through care everywhere we can see that she has had follow-up with podiatry from 06/12/2019 through 10/28/2019 for [name] wound on the plantar aspect of her left first MTP. She states when this started she was having severe knee pain on the right which forced her to alter her gait. She has recently received doxycycline which she is finished and Bactroban. She is back to using Medihoney on the wound. She is offloading with the Pegasys shoe. ZYLPHA, POYNOR [name] ([id number]) [id number] Page 6 of 11 Past medical history includes type 2 diabetes with [name] recent hemoglobin A1c of 6.8, severe type II diabetic neuropathy, history of [name] CVA, stage III chronic renal failure hypertension, depression, of bilateral previous bunion surgery, right first toe amputation and  [name] partial left third toe amputation for osteomyelitis. The patient states that she recently had work to do in her mother's house in [place] and so she was on her feet quite [name] lot which delayed the healing.  ABI in our clinic was 1.1 on the left 8/31; plantar aspect of her left first metatarsal head. Using silver collagen. This is probably [name] surgical wound 9/14; this is [name] patient who had [name] wound on the plantar aspect of her left first metatarsal head in the setting of [name] significant bunion deformity. We use cervical collagen and [name] offloading shoe. She has significant diabetic neuropathy probably some degree of [name] Charcot deformity in her feet. This is closed over today. Readmission 03/30/2021 Ms. Sharlee Rufino is [name] 71 year old female with [name] past medical history of insulin-dependent controlled type 2 diabetes s/p right great toe amputation, right knee osteoarthritis and hypertension that presents to the clinic for [name] second right toe wound. She states she was getting out of the bathtub when her foot got caught the tub and the skin to her second toe was split open. She noticed this after the bath when she saw blood on the floor. She has neuropathy in her feet. She visited the ED following the event on 03/26/2021. They obtained x-rays that showed [name] fracture to the fifth toe but no fracture to the second toe. She was started on doxycycline. She currently denies signs of infection. She has been keeping the area covered with [name] Band-Aid. Readmission 10/21/2021 Ms. Elva Ledford is [name] 71 year old female with [name] past medical history of insulin dependent controlled type 2 diabetes complicated by peripheral neuropathy that presents to the clinic for [name] left met head wound. She has been following with Dr. Earleen Newport, podiatry for over [name] year for this issue. She has tried collagen, mupirocin ointment and Medihoney. She is at high fall risk and cannot tolerate [name] total contact cast. She has orthotics with specialized inserts for  her current issue. She currently denies signs of infection. 8/10; patient presents for follow-up. Unfortunately Regranex was unaffordable at $1200. She has been using Medihoney to the wound bed. She reports aggressively offloading the area. She has offloading inserts. She denies signs of infection. 8/22; patient presents for follow-up. She has been using Medihoney to the wound bed with improvement in wound healing. She has no issues or complaints today. She will be out of town starting next week for the next 2 weeks. 9/11; patient presents for follow-up. She has been using Medihoney to the wound bed. She went on vacation and was last seen 3 weeks ago. She states she had tried to aggressively offload the wound bed. 9/26; patient presents for follow-up. She has been using Medihoney to the wound bed. She has no issues or complaints today. 10/13; patient presents for follow-up. She has been using Medihoney to the wound bed. She has no issues or complaints today. We rediscussed the possibility of [name] total contact cast but she does not feel comfortable as she is [name] high fall risk and lives alone. 10/26; patient presents for follow-up. Has been using Medihoney to the wound bed. She has been approved for Grafix however has [name] 20% co-pay. Patient declined proceeding with this due to financial cost. Patient has no issues or complaints today. 11/16; patient presents for follow-up. She has been using Medihoney to the wound bed. She has been using her diabetic shoes with an offloading foam donut. She declines total contact cast placement today. 11/30; patient presents for follow-up. She has been using Medihoney to the wound bed. She did not receive silver alginate. She has been using the Pegasys with surgical shoe. There has been improvement in wound healing. 12/8; patient presents for follow-up. She has been  using Medihoney and silver alginate to the wound bed. She has been using the Pegasys with surgical  shoe without issues. 12/18; patient presents for follow-up. She has been using Medihoney and silver alginate to the wound bed. She continues to use her Pegasys with [name] surgical shoe. She has no issues or complaints today. Patient History Information obtained from Patient. Family History Cancer - Father, Hypertension - Mother,Father,Siblings, Stroke - Mother,Father, No family history of Diabetes, Heart Disease, Hereditary Spherocytosis, Kidney Disease, Lung Disease, Seizures, Thyroid Problems, Tuberculosis. Social History Never smoker, Marital Status - Single, Alcohol Use - Never, Drug Use - No History, Caffeine Use - Rarely. Medical History Eyes Patient has history of Cataracts Denies history of Glaucoma, Optic Neuritis Ear/Nose/Mouth/Throat Denies history of Chronic sinus problems/congestion, Middle ear problems Hematologic/Lymphatic Denies history of Anemia, Hemophilia, Human Immunodeficiency Virus, Lymphedema, Sickle Cell Disease Respiratory Patient has history of Sleep Apnea Denies history of Aspiration, Asthma, Chronic Obstructive Pulmonary Disease (COPD), Pneumothorax, Tuberculosis Cardiovascular Patient has history of Hypertension Denies history of Angina, Arrhythmia, Congestive Heart Failure, Coronary Artery Disease, Deep Vein Thrombosis, Hypotension, Myocardial Infarction, Peripheral Arterial Disease, Peripheral Venous Disease, Phlebitis, Vasculitis Gastrointestinal Denies history of Cirrhosis , Colitis, Crohnoos, Hepatitis [name], Hepatitis B, Hepatitis C Endocrine Patient has history of Type II Diabetes Denies history of Type I Diabetes Genitourinary Denies history of End Stage Renal Disease Immunological Denies history of Lupus Erythematosus, Raynaudoos, Scleroderma Integumentary (Skin) Denies history of History of Burn Musculoskeletal Patient has history of Osteoarthritis, Osteomyelitis - Hx in 2007 (left 3rd toe) Denies history of Gout, Rheumatoid Arthritis Abascal,  Sheretha [name] ([id number]) [id number] Page 7 of 11 Neurologic Patient has history of Neuropathy Denies history of Dementia, Quadriplegia, Paraplegia, Seizure Disorder Oncologic Patient has history of Received Radiation - 03/2020 Denies history of Received Chemotherapy Psychiatric Patient has history of Confinement Anxiety Denies history of Anorexia/bulimia Hospitalization/Surgery History - Amputation of right great toe. - Tendon repair. - Hysterectomy. - Osteomyelitis left 3rd toe. - cervical fusion with cage. - left breast lumpectomy. Medical [name] Surgical History Notes nd Constitutional Symptoms (General Health) morbid obesity Cardiovascular Takotsubo cardiomyopathy 2012, hyperlipidemia, Hx CVA, Tachycardia Genitourinary CKD stage 3 Musculoskeletal left leg weakness Neurologic CVA Oncologic hx endometrial cancer, left breast CA Psychiatric depression Objective Constitutional respirations regular, non-labored and within target range for patient.. Vitals Time Taken: 12:45 PM, Temperature: 98.1 F, Pulse: 98 bpm, Respiratory Rate: 18 breaths/min, Blood Pressure: 158/83 mmHg. Cardiovascular 2+ dorsalis pedis/posterior tibialis pulses. Psychiatric pleasant and cooperative. General Notes: : Left foot: T the left first metatarsal there is an open wound with granulation tissue, nonviable tissue and callus. Postdebridement there is o healthy granulation tissue. No surrounding signs of infection. Integumentary (Hair, Skin) Wound #4 status is Open. Original cause of wound was Gradually Appeared. The date acquired was: 10/02/2020. The wound has been in treatment 19 weeks. The wound is located on the Left,Plantar Metatarsal head first. The wound measures 0.8cm length x 0.5cm width x 0.2cm depth; 0.314cm^2 area and 0.063cm^3 volume. There is Fat Layer (Subcutaneous Tissue) exposed. There is no tunneling or undermining noted. There is [name] medium amount of  serosanguineous drainage noted. The wound margin is distinct with the outline attached to the wound base. There is large (67-100%) pink, pale granulation within the wound bed. There is no necrotic tissue within the wound bed. The periwound skin appearance exhibited: Callus. The periwound skin appearance did not exhibit: Crepitus, Excoriation, Induration, Rash, Scarring, Dry/Scaly, Maceration, Atrophie Blanche, Cyanosis, Ecchymosis, Hemosiderin Staining, Mottled, Pallor, Rubor, Erythema. Periwound  temperature was noted as No Abnormality. Assessment Active Problems ICD-10 Non-pressure chronic ulcer of other part of left foot with fat layer exposed Type 2 diabetes mellitus with foot ulcer Type 2 diabetes mellitus with diabetic polyneuropathy Patient's wound is slightly larger today. We discussed the importance of aggressive offloading for her wound with healing. She is using her surgical shoe with peg assist insert. I debrided nonviable tissue. I recommended continuing the course with silver alginate and Medihoney. Follow-up in 1-2 weeks Procedures MAYZIE, CAUGHLIN [name] ([id number]) [phone].pdf Page 8 of 11 Wound #4 Pre-procedure diagnosis of Wound #4 is [name] Diabetic Wound/Ulcer of the Lower Extremity located on the Left,Plantar Metatarsal head first .Severity of Tissue Pre Debridement is: Fat layer exposed. There was [name] Excisional Skin/Subcutaneous Tissue Debridement with [name] total area of 0.4 sq cm performed by [name], DO. With the following instrument(s): Curette to remove Viable and Non-Viable tissue/material. Material removed includes Callus, Subcutaneous Tissue, and Slough after achieving pain control using Lidocaine. No specimens were taken. [name] time out was conducted at 12:54, prior to the start of the procedure. [name] Minimum amount of bleeding was controlled with Pressure. The procedure was tolerated well with [name] pain level of 0 throughout and [name] pain level of  0 following the procedure. Post Debridement Measurements: 0.8cm length x 0.5cm width x 0.2cm depth; 0.063cm^3 volume. Character of Wound/Ulcer Post Debridement is improved. Severity of Tissue Post Debridement is: Fat layer exposed. Post procedure Diagnosis Wound #4: Same as Pre-Procedure Plan Follow-up Appointments: Return Appointment in 1 week. - Dr. Heber [place] Tuesday Return Appointment in 2 weeks. - Dr. Heber [place] Tuesday Other: - [facility]=Supplies Anesthetic: (In clinic) Topical Lidocaine 5% applied to wound bed (In clinic) Topical Lidocaine 4% applied to wound bed Cellular or Tissue Based Products: Cellular or Tissue Based Product Type: - IVR for Grafix IVR for [employer]=$225 Copay Bathing/ Shower/ Hygiene: May shower and wash wound with soap and water. Edema Control - Lymphedema / SCD / Other: Avoid standing for long periods of time. Off-Loading: Other: - Diabetic Shoes Relieve pressure/time standing of feet ***Use surgical shoe with peg assist insert with cutout for wound for offloading*** Additional Orders / Instructions: Follow Nutritious Diet - Monitor/Control Blood Sugars Non Wound Condition: Other Non Wound Condition Orders/Instructions: - Apply corn pad or gauze between great toe and 2nd toe. WOUND #4: - Metatarsal head first Wound Laterality: Plantar, Left Cleanser: Soap and Water 1 x Per Day/30 Days Discharge Instructions: May shower and wash wound with dial antibacterial soap and water prior to dressing change. Cleanser: Wound Cleanser (Generic) 1 x Per Day/30 Days Discharge Instructions: Cleanse the wound with wound cleanser prior to applying [name] clean dressing using gauze sponges, not tissue or cotton balls. Peri-Wound Care: Skin Prep (Generic) 1 x Per Day/30 Days Discharge Instructions: Use skin prep as directed Prim Dressing: MediHoney Gel, tube 1.5 (oz) 1 x Per Day/30 Days ary Discharge Instructions: Apply until Regranex obtained Prim Dressing: Optifoam Non-Adhesive  Dressing, 4x4 in (Generic) 1 x Per Day/30 Days ary Discharge Instructions: Apply as donut around wound Prim Dressing: KerraCel Ag Gelling Fiber Dressing, 4x5 in (silver alginate) (Generic) 1 x Per Day/30 Days ary Discharge Instructions: Apply silver alginate to wound bed as instructed Secondary Dressing: ALLEVYN Gentle Border, 3x3 (in/in) (Generic) 1 x Per Day/30 Days Discharge Instructions: Apply over primary dressing as directed. 1. In office sharp debridement 2. Offloadingoosurgical shoe with peg assist 3. Silver alginate and Medihoney 4. Follow-up in 1-2 weeks Electronic Signature(s) Signed: 03/07/2022 3:43:30 PM By:  [name] DO Entered By: [name] on 03/07/2022 13:04:50 -------------------------------------------------------------------------------- HxROS Details Patient Name: Date of Service: Holly Alar [name]. 03/07/2022 12:45 PM Medical Record Number: [id number] Patient Account Number: [id number] Date of Birth/Sex: Treating RN: May 02, 1950 (71 y.o. F) Primary Care Provider: [name] Other Clinician: Referring Provider: Treating Provider/Extender: [name] in Treatment: [name], [name] ([id number]) [id number] Page 9 of 11 Patient Constitutional Symptoms (General Health) Medical History: Past Medical History Notes: morbid obesity Eyes Medical History: Positive for: Cataracts Negative for: Glaucoma; Optic Neuritis Ear/Nose/Mouth/Throat Medical History: Negative for: Chronic sinus problems/congestion; Middle ear problems Hematologic/Lymphatic Medical History: Negative for: Anemia; Hemophilia; Human Immunodeficiency Virus; Lymphedema; Sickle Cell Disease Respiratory Medical History: Positive for: Sleep Apnea Negative for: Aspiration; Asthma; Chronic Obstructive Pulmonary Disease (COPD); Pneumothorax; Tuberculosis Cardiovascular Medical History: Positive  for: Hypertension Negative for: Angina; Arrhythmia; Congestive Heart Failure; Coronary Artery Disease; Deep Vein Thrombosis; Hypotension; Myocardial Infarction; Peripheral Arterial Disease; Peripheral Venous Disease; Phlebitis; Vasculitis Past Medical History Notes: Takotsubo cardiomyopathy 2012, hyperlipidemia, Hx CVA, Tachycardia Gastrointestinal Medical History: Negative for: Cirrhosis ; Colitis; Crohns; Hepatitis [name]; Hepatitis B; Hepatitis C Endocrine Medical History: Positive for: Type II Diabetes Negative for: Type I Diabetes Time with diabetes: over 20 years Treated with: Insulin, Oral agents Blood sugar tested every day: Yes Tested : daily Genitourinary Medical History: Negative for: End Stage Renal Disease Past Medical History Notes: CKD stage 3 Immunological Medical History: Negative for: Lupus Erythematosus; Raynauds; Scleroderma Integumentary (Skin) Medical History: Negative for: History of Burn Musculoskeletal Medical History: Positive for: Osteoarthritis; Osteomyelitis - Hx in 2007 (left 3rd toe) Negative for: Gout; Rheumatoid Arthritis Past Medical History Notes: left leg weakness Neurologic Medical HistoryLABRIA, [name] ([id number]) [id number].pdf Page 10 of 11 Positive for: Neuropathy Negative for: Dementia; Quadriplegia; Paraplegia; Seizure Disorder Past Medical History Notes: CVA Oncologic Medical History: Positive for: Received Radiation - 03/2020 Negative for: Received Chemotherapy Past Medical History Notes: hx endometrial cancer, left breast CA Psychiatric Medical History: Positive for: Confinement Anxiety Negative for: Anorexia/bulimia Past Medical History Notes: depression HBO Extended History Items Eyes: Cataracts Immunizations Pneumococcal Vaccine: Received Pneumococcal Vaccination: Yes Received Pneumococcal Vaccination On or After 60th Birthday: Yes Implantable Devices None Hospitalization / Surgery  History Type of Hospitalization/Surgery Amputation of right great toe Tendon repair Hysterectomy Osteomyelitis left 3rd toe cervical fusion with cage left breast lumpectomy Family and Social History Cancer: Yes - Father; Diabetes: No; Heart Disease: No; Hereditary Spherocytosis: No; Hypertension: Yes - Mother,Father,Siblings; Kidney Disease: No; Lung Disease: No; Seizures: No; Stroke: Yes - Mother,Father; Thyroid Problems: No; Tuberculosis: No; Never smoker; Marital Status - Single; Alcohol Use: Never; Drug Use: No History; Caffeine Use: Rarely; Financial Concerns: No; Food, Clothing or Shelter Needs: No; Support System Lacking: No; Transportation Concerns: No Electronic Signature(s) Signed: 03/07/2022 3:43:30 PM By: [name] DO Entered By: [name] on 03/07/2022 13:02:17 -------------------------------------------------------------------------------- SuperBill Details Patient Name: Date of Service: [name], [name] 03/07/2022 Medical Record Number: [id number] Patient Account Number: [id number] Date of Birth/Sex: Treating RN: [birth year] (71 y.o. [name], [name] Primary Care Provider: [name] Other Clinician: Referring Provider: Treating Provider/Extender: [name] Weeks in Treatment: 19 Diagnosis Coding ICD-10 Codes Code Description [phone] Non-pressure chronic ulcer of other part of left foot with fat layer exposed [name], [name] ([id number]) [phone].pdf Page 11 of 11 E11.621 Type 2 diabetes mellitus with foot ulcer E11.42 Type 2 diabetes mellitus with diabetic polyneuropathy Facility Procedures : CPT4 Code: [id number] Description:  11042 - DEB SUBQ TISSUE 20 SQ CM/< ICD-10 Diagnosis Description L97.522 Non-pressure chronic ulcer of other part of left foot with fat layer exposed E11.621 Type 2 diabetes mellitus with foot ulcer Modifier: Quantity: 1 Physician Procedures : CPT4 Code Description  Modifier [id number] 11042 - WC PHYS SUBQ TISS 20 SQ CM ICD-10 Diagnosis Description L97.522 Non-pressure chronic ulcer of other part of left foot with fat layer exposed E11.621 Type 2 diabetes mellitus with foot ulcer Quantity: 1 Electronic Signature(s) Signed: 03/07/2022 3:43:30 PM By: [name] DO Entered By: [name] on 03/07/2022 13:05:17

## 2022-03-10 NOTE — Progress Notes (Signed)
NIVIA, GERVASE [name] ([id number]) [id number] Page 1 of 7 Visit Report for 03/07/2022 Arrival Information Details Patient Name: Date of Service: [name] 03/07/2022 12:45 PM Medical Record Number: [id number] Patient Account Number: [id number] Date of Birth/Sex: Treating RN: 03-Sep-1950 (71 y.o. [name] Primary Care Samuel Rittenhouse: [name] Other Clinician: Referring Katianna Mcclenney: Treating Akiba Melfi/Extender: [name] in Treatment: 53 Visit Information History Since Last Visit Added or deleted any medications: No Patient Arrived: Ambulatory Any new allergies or adverse reactions: No Arrival Time: 12:46 Had [name] fall or experienced change in No Accompanied By: Self activities of daily living that may affect Transfer Assistance: None risk of falls: Patient Identification Verified: Yes Signs or symptoms of abuse/neglect since last visito No Secondary Verification Process Completed: Yes Hospitalized since last visit: No Patient Requires Transmission-Based Precautions: No Implantable device outside of the clinic excluding No Patient Has Alerts: Yes cellular tissue based products placed in the center Patient Alerts: ABI's R=1.07 L=1.02 since last visit: Has Dressing in Place as Prescribed: Yes Pain Present Now: No Electronic Signature(s) Signed: 03/07/2022 3:55:49 PM By: Sharyn Creamer RN, BSN Entered By: Sharyn Creamer on 03/07/2022 12:47:10 -------------------------------------------------------------------------------- Encounter Discharge Information Details Patient Name: Date of Service: [name], [name]. 03/07/2022 12:45 PM Medical Record Number: [id number] Patient Account Number: [id number] Date of Birth/Sex: Treating RN: [birth year] (71 y.o. [name], [name] Primary Care Hermione Havlicek: [name] Other Clinician: Referring Lycia Sachdeva: Treating Randie Tallarico/Extender: [name] in  Treatment: 19 Encounter Discharge Information Items Post Procedure Vitals Discharge Condition: Stable Temperature (F): 98.7 Ambulatory Status: Ambulatory Pulse (bpm): 74 Discharge Destination: Home Respiratory Rate (breaths/min): 17 Transportation: Private Auto Blood Pressure (mmHg): 120/80 Accompanied By: self Schedule Follow-up Appointment: Yes Clinical Summary of Care: Patient Declined Electronic Signature(s) Signed: 03/09/2022 5:39:23 PM By: Rhae Hammock RN Entered By: Rhae Hammock on 03/07/2022 13:07:28 Estorga, [name] ([id number]) [id number] Page 2 of 7 -------------------------------------------------------------------------------- Lower Extremity Assessment Details Patient Name: Date of Service: Holly Alar [name]. 03/07/2022 12:45 PM Medical Record Number: [id number] Patient Account Number: [id number] Date of Birth/Sex: Treating RN: 1950/12/12 (71 y.o. [name] Primary Care Ifeoluwa Beller: [name] Other Clinician: Referring Stran Raper: Treating Aayana Reinertsen/Extender: [name] Weeks in Treatment: 19 Edema Assessment Assessed: [Left: No] [Right: No] Edema: [Left: N] [Right: o] Calf Left: Right: Point of Measurement: 34 cm From Medial Instep 45.3 cm Ankle Left: Right: Point of Measurement: 9 cm From Medial Instep 27.5 cm Vascular Assessment Pulses: Dorsalis Pedis Palpable: [Left:Yes] Electronic Signature(s) Signed: 03/07/2022 3:55:49 PM By: Sharyn Creamer RN, BSN Entered By: Sharyn Creamer on 03/07/2022 12:48:52 -------------------------------------------------------------------------------- Multi Wound Chart Details Patient Name: Date of Service: [name], Holly Dess RA H [name]. 03/07/2022 12:45 PM Medical Record Number: [id number] Patient Account Number: [id number] Date of Birth/Sex: Treating RN: Sep 13, 1950 (71 y.o. F) Primary Care Morris Markham: [name] Other Clinician: Referring Randa Riss: Treating  Ty Buntrock/Extender: [name] in Treatment: 19 Vital Signs Height(in): Pulse(bpm): 54 Weight(lbs): Blood Pressure(mmHg): 158/83 Body [name] Index(BMI): Temperature(F): 98.1 Respiratory Rate(breaths/min): 18 [4:Photos:] [N/[name]:N/[name]] Left, Plantar Metatarsal head first N/[name] N/[name] Wound Location: Gradually Appeared N/[name] N/[name] Wounding Event: Diabetic Wound/Ulcer of the Lower N/[name] N/[name] Primary Etiology: Extremity Cataracts, Sleep Apnea, Hypertension, N/[name] N/[name] Comorbid History: Type II Diabetes, Osteoarthritis, Osteomyelitis, Neuropathy, Received Radiation, Confinement Anxiety 10/02/2020 N/[name] N/[name] Date Acquired: 49 N/[name] N/[name] Weeks of Treatment: Open N/[name] N/[name] Wound Status: No N/[name] N/[name] Wound Recurrence: 0.8x0.5x0.2 N/[name] N/[name] Measurements L x W x D (  cm) 0.314 N/[name] N/[name] [name] (cm) : rea 0.063 N/[name] N/[name] Volume (cm) : 23.00% N/[name] N/[name] % Reduction in [name] rea: 48.80% N/[name] N/[name] % Reduction in Volume: Grade 1 N/[name] N/[name] Classification: Medium N/[name] N/[name] Exudate [name] mount: Serosanguineous N/[name] N/[name] Exudate Type: red, brown N/[name] N/[name] Exudate Color: Distinct, outline attached N/[name] N/[name] Wound Margin: Large (67-100%) N/[name] N/[name] Granulation [name] mount: Pink, Pale N/[name] N/[name] Granulation Quality: None Present (0%) N/[name] N/[name] Necrotic [name] mount: Fat Layer (Subcutaneous Tissue): Yes N/[name] N/[name] Exposed Structures: Fascia: No Tendon: No Muscle: No Joint: No Bone: No Large (67-100%) N/[name] N/[name] Epithelialization: Debridement - Excisional N/[name] N/[name] Debridement: Pre-procedure Verification/Time Out 12:54 N/[name] N/[name] Taken: Lidocaine N/[name] N/[name] Pain Control: Callus, Subcutaneous, Slough N/[name] N/[name] Tissue Debrided: Skin/Subcutaneous Tissue N/[name] N/[name] Level: 0.4 N/[name] N/[name] Debridement [name] (sq cm): rea Curette N/[name] N/[name] Instrument: Minimum N/[name] N/[name] Bleeding: Pressure N/[name] N/[name] Hemostasis [name] chieved: 0 N/[name] N/[name] Procedural Pain: 0 N/[name] N/[name] Post Procedural Pain: Procedure was tolerated well N/[name] N/[name] Debridement Treatment  Response: 0.8x0.5x0.2 N/[name] N/[name] Post Debridement Measurements L x W x D (cm) 0.063 N/[name] N/[name] Post Debridement Volume: (cm) Callus: Yes N/[name] N/[name] Periwound Skin Texture: Excoriation: No Induration: No Crepitus: No Rash: No Scarring: No Maceration: No N/[name] N/[name] Periwound Skin Moisture: Dry/Scaly: No Atrophie Blanche: No N/[name] N/[name] Periwound Skin Color: Cyanosis: No Ecchymosis: No Erythema: No Hemosiderin Staining: No Mottled: No Pallor: No Rubor: No No Abnormality N/[name] N/[name] Temperature: Debridement N/[name] N/[name] Procedures Performed: Treatment Notes Electronic Signature(s) Signed: 03/07/2022 3:43:30 PM By: [name] DO Entered By: [name] on 03/07/2022 13:01:30 Multi-Disciplinary Care Plan Details -------------------------------------------------------------------------------- Vennie Homans [name] ([id number]) [phone].pdf Page 4 of 7 Patient Name: Date of Service: Holly Alar [name]. 03/07/2022 12:45 PM Medical Record Number: [id number] Patient Account Number: [id number] Date of Birth/Sex: Treating RN: Jan 27, 1951 (71 y.o. [name], [name] Primary Care Toben Acuna: [name] Other Clinician: Referring Dorthy Magnussen: Treating Tillman Kazmierski/Extender: [name] in Treatment: 37 Active Inactive Nutrition Nursing Diagnoses: Impaired glucose control: actual or potential Goals: Patient/caregiver verbalizes understanding of need to maintain therapeutic glucose control per primary care physician Date Initiated: 10/21/2021 Target Resolution Date: 03/19/2022 Goal Status: Active Interventions: Assess HgA1c results as ordered upon admission and as needed Provide education on elevated blood sugars and impact on wound healing Notes: Wound/Skin Impairment Nursing Diagnoses: Impaired tissue integrity Goals: Patient/caregiver will verbalize understanding of skin care regimen Date Initiated: 10/21/2021 Target Resolution Date:  03/19/2022 Goal Status: Active Ulcer/skin breakdown will have [name] volume reduction of 30% by week 4 Date Initiated: 10/21/2021 Date Inactivated: 11/29/2021 Target Resolution Date: 11/18/2021 Goal Status: Met Interventions: Assess patient/caregiver ability to obtain necessary supplies Assess patient/caregiver ability to perform ulcer/skin care regimen upon admission and as needed Assess ulceration(s) every visit Provide education on ulcer and skin care Treatment Activities: Topical wound management initiated : 10/21/2021 Notes: 11/29/21: Wound care regimen continues. Running IVR for skin sub Electronic Signature(s) Signed: 03/09/2022 5:39:23 PM By: Rhae Hammock RN Entered By: Rhae Hammock on 03/07/2022 13:03:00 -------------------------------------------------------------------------------- Pain Assessment Details Patient Name: Date of Service: [name], Holly Dess RA H [name]. 03/07/2022 12:45 PM Medical Record Number: [id number] Patient Account Number: [id number] Date of Birth/Sex: Treating RN: 13-Jul-1950 (71 y.o. [name] Primary Care Dalene Robards: [name] Other Clinician: Referring Keishawna Carranza: Treating Anner Baity/Extender: [name] Weeks in Treatment: 19 Active Problems Location of Pain Severity and Description of Pain [name], [name] ([id number]) [phone].pdf Page 5 of 7 Patient Has Paino No Site Locations Pain  Management and Medication Current Pain Management: Electronic Signature(s) Signed: 03/07/2022 3:55:49 PM By: Sharyn Creamer RN, BSN Entered By: Sharyn Creamer on 03/07/2022 12:47:46 -------------------------------------------------------------------------------- Patient/Caregiver Education Details Patient Name: Date of Service: Holly Alar [name]. 12/18/2023andnbsp12:45 PM Medical Record Number: [id number] Patient Account Number: [id number] Date of Birth/Gender: Treating RN: 1951/01/25 (71 y.o. [name],  [name] Primary Care Physician: [name] Other Clinician: Referring Physician: Treating Physician/Extender: [name] in Treatment: 71 Education Assessment Education Provided To: Patient Education Topics Provided Wound/Skin Impairment: Methods: Explain/Verbal Responses: Reinforcements needed, State content correctly [employer]) Signed: 03/09/2022 5:39:23 PM By: Rhae Hammock RN Entered By: Rhae Hammock on 03/07/2022 13:05:54 -------------------------------------------------------------------------------- Wound Assessment Details Patient Name: Date of Service: [name], Holly Dess RA H [name]. 03/07/2022 12:45 PM Medical Record Number: [id number] Patient Account Number: [id number] Date of Birth/Sex: Treating RN: June 29, 1950 (71 y.o. [name], [name], [name] ([id number]) [phone].pdf Page 6 of 7 Primary Care Darrion Macaulay: [name] Other Clinician: Referring Barbie Croston: Treating Aiyah Scarpelli/Extender: [name] in Treatment: 19 Wound Status Wound Number: 4 Primary Diabetic Wound/Ulcer of the Lower Extremity Etiology: Wound Location: Left, Plantar Metatarsal head first Wound Open Wounding Event: Gradually Appeared Status: Date Acquired: 10/02/2020 Comorbid Cataracts, Sleep Apnea, Hypertension, Type II Diabetes, Weeks Of Treatment: 19 History: Osteoarthritis, Osteomyelitis, Neuropathy, Received Radiation, Clustered Wound: No Confinement Anxiety Photos Wound Measurements Length: (cm) 0.8 Width: (cm) 0.5 Depth: (cm) 0.2 Area: (cm) 0.314 Volume: (cm) 0.063 % Reduction in Area: 23% % Reduction in Volume: 48.8% Epithelialization: Large (67-100%) Tunneling: No Undermining: No Wound Description Classification: Grade 1 Wound Margin: Distinct, outline attached Exudate Amount: Medium Exudate Type: Serosanguineous Exudate Color: red, brown Foul Odor After Cleansing:  No Slough/Fibrino No Wound Bed Granulation Amount: Large (67-100%) Exposed Structure Granulation Quality: Pink, Pale Fascia Exposed: No Necrotic Amount: None Present (0%) Fat Layer (Subcutaneous Tissue) Exposed: Yes Tendon Exposed: No Muscle Exposed: No Joint Exposed: No Bone Exposed: No Periwound Skin Texture Texture Color No Abnormalities Noted: No No Abnormalities Noted: No Callus: Yes Atrophie Blanche: No Crepitus: No Cyanosis: No Excoriation: No Ecchymosis: No Induration: No Erythema: No Rash: No Hemosiderin Staining: No Scarring: No Mottled: No Pallor: No Moisture Rubor: No No Abnormalities Noted: No Dry / Scaly: No Temperature / Pain Maceration: No Temperature: No Abnormality Treatment Notes Wound #4 (Metatarsal head first) Wound Laterality: Plantar, Left Cleanser Soap and Water Discharge Instruction: May shower and wash wound with dial antibacterial soap and water prior to dressing change. Wound Cleanser Discharge Instruction: Cleanse the wound with wound cleanser prior to applying [name] clean dressing using gauze sponges, not tissue or cotton balls. [name], [name] ([id number]) [id number] Page 7 of 7 Peri-Wound Care Skin Prep Discharge Instruction: Use skin prep as directed Topical Primary Dressing MediHoney Gel, tube 1.5 (oz) Discharge Instruction: Apply until Regranex obtained Optifoam Non-Adhesive Dressing, 4x4 in Discharge Instruction: Apply as donut around wound KerraCel Ag Gelling Fiber Dressing, 4x5 in (silver alginate) Discharge Instruction: Apply silver alginate to wound bed as instructed Secondary Dressing ALLEVYN Gentle Border, 3x3 (in/in) Discharge Instruction: Apply over primary dressing as directed. Secured With Compression Wrap Compression Stockings Environmental education officer) Signed: 03/07/2022 3:55:49 PM By: Sharyn Creamer RN, BSN Entered By: Sharyn Creamer on 03/07/2022  12:49:29 -------------------------------------------------------------------------------- [place] Details Patient Name: Date of Service: [name], [name]. 03/07/2022 12:45 PM Medical Record Number: [id number] Patient Account Number: [id number] Date of Birth/Sex: Treating RN: Mar 29, 1950 (71 y.o. [name] Primary Care Graylin Sperling: [name] Other Clinician:  Referring Camira Geidel: Treating Jeremias Broyhill/Extender: [name] in Treatment: 19 Vital Signs Time Taken: 12:45 Temperature (F): 98.1 Pulse (bpm): 98 Respiratory Rate (breaths/min): 18 Blood Pressure (mmHg): 158/83 Reference Range: 80 - 120 mg / dl Electronic Signature(s) Signed: 03/07/2022 3:55:49 PM By: Sharyn Creamer RN, BSN Entered By: Sharyn Creamer on 03/07/2022 12:47:38

## 2022-03-15 ENCOUNTER — Ambulatory Visit (HOSPITAL_BASED_OUTPATIENT_CLINIC_OR_DEPARTMENT_OTHER): Payer: PPO | Admitting: Internal Medicine

## 2022-03-16 DIAGNOSIS — Z89411 Acquired absence of right great toe: Secondary | ICD-10-CM | POA: Diagnosis not present

## 2022-03-16 DIAGNOSIS — Z89422 Acquired absence of other left toe(s): Secondary | ICD-10-CM | POA: Diagnosis not present

## 2022-03-16 DIAGNOSIS — E1142 Type 2 diabetes mellitus with diabetic polyneuropathy: Secondary | ICD-10-CM | POA: Diagnosis not present

## 2022-03-16 DIAGNOSIS — I10 Essential (primary) hypertension: Secondary | ICD-10-CM | POA: Diagnosis not present

## 2022-03-16 DIAGNOSIS — Z9181 History of falling: Secondary | ICD-10-CM | POA: Diagnosis not present

## 2022-03-17 ENCOUNTER — Encounter (HOSPITAL_BASED_OUTPATIENT_CLINIC_OR_DEPARTMENT_OTHER): Payer: PPO | Admitting: Internal Medicine

## 2022-03-17 DIAGNOSIS — E11621 Type 2 diabetes mellitus with foot ulcer: Secondary | ICD-10-CM | POA: Diagnosis not present

## 2022-03-17 DIAGNOSIS — L97522 Non-pressure chronic ulcer of other part of left foot with fat layer exposed: Secondary | ICD-10-CM | POA: Diagnosis not present

## 2022-03-17 NOTE — Progress Notes (Signed)
[name], [name] ([id number]) [id number] Page 1 of 8 Visit Report for 03/17/2022 Arrival Information Details Patient Name: Date of Service: Holly Alar [name]. 03/17/2022 12:30 PM Medical Record Number: [id number] Patient Account Number: [id number] Date of Birth/Sex: Treating RN: 11-30-1950 (71 y.o. F) Primary Care Mckaylie Vasey: [name] Other Clinician: Referring Nickole Adamek: Treating Selia Wareing/Extender: [name] in Treatment: 21 Visit Information History Since Last Visit Added or deleted any medications: No Patient Arrived: Cane Any new allergies or adverse reactions: No Arrival Time: 12:38 Had [name] fall or experienced change in No Accompanied By: self activities of daily living that may affect Transfer Assistance: None risk of falls: Patient Identification Verified: Yes Signs or symptoms of abuse/neglect since last visito No Secondary Verification Process Completed: Yes Hospitalized since last visit: No Patient Requires Transmission-Based Precautions: No Implantable device outside of the clinic excluding No Patient Has Alerts: Yes cellular tissue based products placed in the center Patient Alerts: ABI's R=1.07 L=1.02 since last visit: Has Dressing in Place as Prescribed: Yes Pain Present Now: No Electronic Signature(s) Signed: 03/17/2022 4:51:54 PM By: Erenest Blank Entered By: Erenest Blank on 03/17/2022 12:40:29 -------------------------------------------------------------------------------- Encounter Discharge Information Details Patient Name: Date of Service: [name], Holly RA H [name]. 03/17/2022 12:30 PM Medical Record Number: [id number] Patient Account Number: [id number] Date of Birth/Sex: Treating RN: [birth year] (71 y.o. [name], [name] Primary Care Lasonya Hubner: [name] Other Clinician: Referring Pearlene Teat: Treating Atiana Levier/Extender: [name] in Treatment: 21 Encounter Discharge  Information Items Post Procedure Vitals Discharge Condition: Stable Temperature (F): 98.7 Ambulatory Status: Ambulatory Pulse (bpm): 74 Discharge Destination: Home Respiratory Rate (breaths/min): 17 Transportation: Private Auto Blood Pressure (mmHg): 134/74 Accompanied By: self Schedule Follow-up Appointment: Yes Clinical Summary of Care: Patient Declined Electronic Signature(s) Signed: 03/17/2022 4:52:17 PM By: Rhae Hammock RN Entered By: Rhae Hammock on 03/17/2022 13:04:24 Cruickshank, Evelise [name] ([id number]) [id number] Page 2 of 8 -------------------------------------------------------------------------------- Lower Extremity Assessment Details Patient Name: Date of Service: Holly Alar [name]. 03/17/2022 12:30 PM Medical Record Number: [id number] Patient Account Number: [id number] Date of Birth/Sex: Treating RN: [birth year] (71 y.o. F) Primary Care Jaymari Cromie: [name] Other Clinician: Referring Ellean Firman: Treating Coralyn Roselli/Extender: [name] Weeks in Treatment: 21 Edema Assessment Assessed: [Left: No] [Right: No] Edema: [Left: N] [Right: o] Calf Left: Right: Point of Measurement: 34 cm From Medial Instep 48.5 cm Ankle Left: Right: Point of Measurement: 9 cm From Medial Instep 29.2 cm Electronic Signature(s) Signed: 03/17/2022 4:51:54 PM By: Erenest Blank Entered By: Erenest Blank on 03/17/2022 12:45:19 -------------------------------------------------------------------------------- Multi Wound Chart Details Patient Name: Date of Service: [name], Holly Dess RA H [name]. 03/17/2022 12:30 PM Medical Record Number: [id number] Patient Account Number: [id number] Date of Birth/Sex: Treating RN: 11-Dec-1950 (71 y.o. F) Primary Care Leslieanne Cobarrubias: [name] Other Clinician: Referring Trent Theisen: Treating Cordella Nyquist/Extender: [name] in Treatment: 21 Vital Signs Height(in): Pulse(bpm):  55 Weight(lbs): Blood Pressure(mmHg): 121/77 Body Mass Index(BMI): Temperature(F): 98 Respiratory Rate(breaths/min): 18 [4:Photos:] [N/[name]:N/[name]] Left, Plantar Metatarsal head first N/[name] N/[name] Wound Location: Gradually Appeared N/[name] N/[name] Wounding Event: Diabetic Wound/Ulcer of the Lower N/[name] N/[name] Primary Etiology: Extremity Cataracts, Sleep Apnea, Hypertension, N/[name] N/[name] Comorbid History: Type II Diabetes, Osteoarthritis, Merrihew, Nadege [name] ([id number]) [id number] Page 3 of 8 Osteomyelitis, Neuropathy, Received Radiation, Confinement Anxiety 10/02/2020 N/[name] N/[name] Date Acquired: 21 N/[name] N/[name] Weeks of Treatment: Open N/[name] N/[name] Wound Status: No N/[name] N/[name] Wound Recurrence: 0.4x0.1x0.2 N/[name] N/[name] Measurements L x W x D (cm) 0.031 N/[name] N/[name] [name] (cm) :  rea 0.006 N/[name] N/[name] Volume (cm) : 92.40% N/[name] N/[name] % Reduction in [name] rea: 95.10% N/[name] N/[name] % Reduction in Volume: Grade 1 N/[name] N/[name] Classification: Medium N/[name] N/[name] Exudate [name] mount: Serosanguineous N/[name] N/[name] Exudate Type: red, brown N/[name] N/[name] Exudate Color: Distinct, outline attached N/[name] N/[name] Wound Margin: Large (67-100%) N/[name] N/[name] Granulation [name] mount: Pink, Pale N/[name] N/[name] Granulation Quality: None Present (0%) N/[name] N/[name] Necrotic [name] mount: Fat Layer (Subcutaneous Tissue): Yes N/[name] N/[name] Exposed Structures: Fascia: No Tendon: No Muscle: No Joint: No Bone: No Large (67-100%) N/[name] N/[name] Epithelialization: Debridement - Excisional N/[name] N/[name] Debridement: Pre-procedure Verification/Time Out 13:00 N/[name] N/[name] Taken: Lidocaine N/[name] N/[name] Pain Control: Callus, Subcutaneous, Slough N/[name] N/[name] Tissue Debrided: Skin/Subcutaneous Tissue N/[name] N/[name] Level: 0.04 N/[name] N/[name] Debridement [name] (sq cm): rea Curette N/[name] N/[name] Instrument: Minimum N/[name] N/[name] Bleeding: Pressure N/[name] N/[name] Hemostasis [name] chieved: 0 N/[name] N/[name] Procedural Pain: 0 N/[name] N/[name] Post Procedural Pain: Procedure was tolerated well N/[name] N/[name] Debridement Treatment Response: 0.4x0.1x0.2 N/[name] N/[name] Post  Debridement Measurements L x W x D (cm) 0.006 N/[name] N/[name] Post Debridement Volume: (cm) Callus: Yes N/[name] N/[name] Periwound Skin Texture: Excoriation: No Induration: No Crepitus: No Rash: No Scarring: No Maceration: No N/[name] N/[name] Periwound Skin Moisture: Dry/Scaly: No Atrophie Blanche: No N/[name] N/[name] Periwound Skin Color: Cyanosis: No Ecchymosis: No Erythema: No Hemosiderin Staining: No Mottled: No Pallor: No Rubor: No No Abnormality N/[name] N/[name] Temperature: Debridement N/[name] N/[name] Procedures Performed: Treatment Notes Wound #4 (Metatarsal head first) Wound Laterality: Plantar, Left Cleanser Soap and Water Discharge Instruction: May shower and wash wound with dial antibacterial soap and water prior to dressing change. Wound Cleanser Discharge Instruction: Cleanse the wound with wound cleanser prior to applying [name] clean dressing using gauze sponges, not tissue or cotton balls. Peri-Wound Care Skin Prep Discharge Instruction: Use skin prep as directed Topical Primary Dressing MediHoney Gel, tube 1.5 (oz) Discharge Instruction: Apply until Regranex obtained Optifoam Non-Adhesive Dressing, 4x4 in Discharge Instruction: Apply as donut around wound LAURIEL, HELIN [name] ([id number]) [id number] Page 4 of 8 KerraCel Ag Gelling Fiber Dressing, 4x5 in (silver alginate) Discharge Instruction: Apply silver alginate to wound bed as instructed Secondary Dressing ALLEVYN Gentle Border, 3x3 (in/in) Discharge Instruction: Apply over primary dressing as directed. Secured With Compression Wrap Compression Stockings Environmental education officer) Signed: 03/17/2022 4:25:19 PM By: [name] DO Entered By: [name] on 03/17/2022 13:28:18 -------------------------------------------------------------------------------- Multi-Disciplinary Care Plan Details Patient Name: Date of Service: [name], Holly Dess RA H [name]. 03/17/2022 12:30 PM Medical Record Number: [id number] Patient  Account Number: [id number] Date of Birth/Sex: Treating RN: Sep 29, 1950 (71 y.o. [name], [name] Primary Care Brailyn Delman: [name] Other Clinician: Referring Jaeshawn Silvio: Treating Ruthie Berch/Extender: [name] in Treatment: 21 Active Inactive Nutrition Nursing Diagnoses: Impaired glucose control: actual or potential Goals: Patient/caregiver verbalizes understanding of need to maintain therapeutic glucose control per primary care physician Date Initiated: 10/21/2021 Target Resolution Date: 03/19/2022 Goal Status: Active Interventions: Assess HgA1c results as ordered upon admission and as needed Provide education on elevated blood sugars and impact on wound healing Notes: Wound/Skin Impairment Nursing Diagnoses: Impaired tissue integrity Goals: Patient/caregiver will verbalize understanding of skin care regimen Date Initiated: 10/21/2021 Target Resolution Date: 03/19/2022 Goal Status: Active Ulcer/skin breakdown will have [name] volume reduction of 30% by week 4 Date Initiated: 10/21/2021 Date Inactivated: 11/29/2021 Target Resolution Date: 11/18/2021 Goal Status: Met Interventions: Assess patient/caregiver ability to obtain necessary supplies Assess patient/caregiver ability to perform ulcer/skin care regimen upon admission and as needed Assess ulceration(s) every visit  Provide education on ulcer and skin care Treatment Activities: [name], [name] ([id number]) [id number] Page 5 of 8 Topical wound management initiated : 10/21/2021 Notes: 11/29/21: Wound care regimen continues. Running IVR for skin sub Electronic Signature(s) Signed: 03/17/2022 4:52:17 PM By: Rhae Hammock RN Entered By: Rhae Hammock on 03/17/2022 13:01:31 -------------------------------------------------------------------------------- Pain Assessment Details Patient Name: Date of Service: [name], Holly RA H [name]. 03/17/2022 12:30 PM Medical Record Number:  [id number] Patient Account Number: [id number] Date of Birth/Sex: Treating RN: [birth year] (71 y.o. F) Primary Care Rabecca Birge: [name] Other Clinician: Referring Emmalise Huard: Treating Myonna Chisom/Extender: [name] in Treatment: 21 Active Problems Location of Pain Severity and Description of Pain Patient Has Paino No Site Locations Pain Management and Medication Current Pain Management: Electronic Signature(s) Signed: 03/17/2022 4:51:54 PM By: Erenest Blank Entered By: Erenest Blank on 03/17/2022 12:40:58 -------------------------------------------------------------------------------- Patient/Caregiver Education Details Patient Name: Date of Service: Holly Alar [name]. 12/28/2023andnbsp12:30 PM Medical Record Number: [id number] Patient Account Number: [id number] Date of Birth/Gender: Treating RN: [birth year] (71 y.o. [name], [name] Primary Care Physician: [name] Other Clinician: Referring Physician: Treating Physician/Extender: [name] in Treatment: [address], [place] [name] ([id number]) [id number] Page 6 of 8 Education Assessment Education Provided To: Patient Education Topics Provided Wound/Skin Impairment: Methods: Explain/Verbal Responses: Reinforcements needed, State content correctly Electronic Signature(s) Signed: 03/17/2022 4:52:17 PM By: Rhae Hammock RN Entered By: Rhae Hammock on 03/17/2022 13:03:38 -------------------------------------------------------------------------------- Wound Assessment Details Patient Name: Date of Service: [name], Holly RA H [name]. 03/17/2022 12:30 PM Medical Record Number: [id number] Patient Account Number: [id number] Date of Birth/Sex: Treating RN: 02-Nov-1950 (71 y.o. F) Primary Care Deandra Gadson: [name] Other Clinician: Referring Naydeen Speirs: Treating Devery Odwyer/Extender: [name] Weeks in Treatment: 21 Wound  Status Wound Number: 4 Primary Diabetic Wound/Ulcer of the Lower Extremity Etiology: Wound Location: Left, Plantar Metatarsal head first Wound Open Wounding Event: Gradually Appeared Status: Date Acquired: 10/02/2020 Comorbid Cataracts, Sleep Apnea, Hypertension, Type II Diabetes, Weeks Of Treatment: 21 History: Osteoarthritis, Osteomyelitis, Neuropathy, Received Radiation, Clustered Wound: No Confinement Anxiety Photos Wound Measurements Length: (cm) 0.4 Width: (cm) 0.1 Depth: (cm) 0.2 Area: (cm) 0.031 Volume: (cm) 0.006 % Reduction in Area: 92.4% % Reduction in Volume: 95.1% Epithelialization: Large (67-100%) Tunneling: No Undermining: No Wound Description Classification: Grade 1 Wound Margin: Distinct, outline attached Exudate Amount: Medium Exudate Type: Serosanguineous Exudate Color: red, brown Foul Odor After Cleansing: No Slough/Fibrino No Wound Bed Granulation Amount: Large (67-100%) Exposed Structure Granulation Quality: Pink, Pale Fascia Exposed: No [name], [name] ([id number]) [id number] Page 7 of 8 Necrotic Amount: None Present (0%) Fat Layer (Subcutaneous Tissue) Exposed: Yes Tendon Exposed: No Muscle Exposed: No Joint Exposed: No Bone Exposed: No Periwound Skin Texture Texture Color No Abnormalities Noted: No No Abnormalities Noted: No Callus: Yes Atrophie Blanche: No Crepitus: No Cyanosis: No Excoriation: No Ecchymosis: No Induration: No Erythema: No Rash: No Hemosiderin Staining: No Scarring: No Mottled: No Pallor: No Moisture Rubor: No No Abnormalities Noted: No Dry / Scaly: No Temperature / Pain Maceration: No Temperature: No Abnormality Treatment Notes Wound #4 (Metatarsal head first) Wound Laterality: Plantar, Left Cleanser Soap and Water Discharge Instruction: May shower and wash wound with dial antibacterial soap and water prior to dressing change. Wound Cleanser Discharge Instruction: Cleanse  the wound with wound cleanser prior to applying [name] clean dressing using gauze sponges, not tissue or cotton balls. Peri-Wound Care Skin Prep Discharge Instruction: Use skin prep as directed Topical Primary Dressing MediHoney Gel, tube 1.5 (oz) Discharge  Instruction: Apply until Regranex obtained Optifoam Non-Adhesive Dressing, 4x4 in Discharge Instruction: Apply as donut around wound KerraCel Ag Gelling Fiber Dressing, 4x5 in (silver alginate) Discharge Instruction: Apply silver alginate to wound bed as instructed Secondary Dressing ALLEVYN Gentle Border, 3x3 (in/in) Discharge Instruction: Apply over primary dressing as directed. Secured With Compression Wrap Compression Stockings Environmental education officer) Signed: 03/17/2022 4:51:54 PM By: Erenest Blank Entered By: Erenest Blank on 03/17/2022 12:46:51 -------------------------------------------------------------------------------- Vitals Details Patient Name: Date of Service: [name], Holly RA H [name]. 03/17/2022 12:30 PM Medical Record Number: [id number] Patient Account Number: [id number] Date of Birth/Sex: Treating RN: 02/19/1951 (71 y.o. F) Primary Care Aleya Durnell: [name] Other Clinician: Referring Carolos Fecher: Treating Cezar Misiaszek/Extender: [name] [place], [place] [name] ([id number]) [id number] Page 8 of 8 Weeks in Treatment: 21 Vital Signs Time Taken: 12:40 Temperature (F): 98 Pulse (bpm): 89 Respiratory Rate (breaths/min): 18 Blood Pressure (mmHg): 121/77 Reference Range: 80 - 120 mg / dl Electronic Signature(s) Signed: 03/17/2022 4:51:54 PM By: Erenest Blank Entered By: Erenest Blank on 03/17/2022 12:40:46

## 2022-03-17 NOTE — Progress Notes (Signed)
HENLEY, BLYTH [name] ([id number]) [id number] Page 1 of 11 Visit Report for 03/17/2022 Chief Complaint Document Details Patient Name: Date of Service: [name]. 03/17/2022 12:30 PM Medical Record Number: [id number] Patient Account Number: [id number] Date of Birth/Sex: Treating RN: [birth year] (71 y.o. F) Primary Care Provider: Harlan [name] Other Clinician: Referring Provider: Treating Provider/Extender: [name] in Treatment: 21 Information Obtained from: Patient Chief Complaint Left plantar ulcer 11/12/2019; patient is here referred by podiatry for review of [name] wound on the left first plantar metatarsal head 03/30/2021; right second toe wound s/p Trauma 10/21/2021; First left metatarsal head wound Electronic Signature(s) Signed: 03/17/2022 4:25:19 PM By: Holly Shan DO Entered By: [name] on 03/17/2022 13:28:27 -------------------------------------------------------------------------------- Debridement Details Patient Name: Date of Service: [name], [name]. 03/17/2022 12:30 PM Medical Record Number: [id number] Patient Account Number: [id number] Date of Birth/Sex: Treating RN: Aug 23, 1950 (71 y.o. [name], [name] Primary Care Provider: Harlan [name] Other Clinician: Referring Provider: Treating Provider/Extender: [name] in Treatment: 21 Debridement Performed for Assessment: Wound #4 Left,Plantar Metatarsal head first Performed By: Physician Holly Shan, DO Debridement Type: Debridement Severity of Tissue Pre Debridement: Fat layer exposed Level of Consciousness (Pre-procedure): Awake and Alert Pre-procedure Verification/Time Out Yes - 13:00 Taken: Start Time: 13:00 Pain Control: Lidocaine T Area Debrided (L x W): otal 0.4 (cm) x 0.1 (cm) = 0.04 (cm) Tissue and other material debrided: Viable, Non-Viable, Callus, Slough, Subcutaneous, Slough Level:  Skin/Subcutaneous Tissue Debridement Description: Excisional Instrument: Curette Bleeding: Minimum Hemostasis Achieved: Pressure End Time: 13:00 Procedural Pain: 0 Post Procedural Pain: 0 Response to Treatment: Procedure was tolerated well Level of Consciousness (Post- Awake and Alert procedure): Post Debridement Measurements of Total Wound Length: (cm) 0.4 Width: (cm) 0.1 Depth: (cm) 0.2 Volume: (cm) 0.006 [name], [name] ([id number]) [id number] Page 2 of 11 Character of Wound/Ulcer Post Debridement: Improved Severity of Tissue Post Debridement: Fat layer exposed Post Procedure Diagnosis Same as Pre-procedure Electronic Signature(s) Signed: 03/17/2022 4:25:19 PM By: Holly Shan DO Signed: 03/17/2022 4:52:17 PM By: Holly Hammock RN Entered By: [name] on 03/17/2022 13:02:56 -------------------------------------------------------------------------------- HPI Details Patient Name: Date of Service: [name], [name]. 03/17/2022 12:30 PM Medical Record Number: [id number] Patient Account Number: [id number] Date of Birth/Sex: Treating RN: 1951-02-12 (71 y.o. F) Primary Care Provider: Harlan [name] Other Clinician: Referring Provider: Treating Provider/Extender: [name] in Treatment: 21 History of Present Illness HPI Description: 05/10/17 patient presents today for initial evaluation and our clinic concerning issues that she has been having with an ulceration on the plantar aspect of the left first metatarsal head. She has been seen [name] podiatrist Dr. Jacqualyn [name] for this since August 2018. Subsequently he has recommended bunion surgery as that seems to be the culprit for why there is pressure and friction occurring at the site causing the callous and subsequently the wound. 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 With that being said I do have notes from several of his visits one of which does include an x-ray documentation stating that she had [name] negative x-ray. There was obviously the bunion the notes I have extended from 11 deformity. With that being said she has not had an MRI fortunately this wound does not probe to bone. The notes I have extended from February 08, 2017 through April 14, 2017. During that course she was also placed on antibiotics to help with an infection two weeks ago and this was doxycycline. Fortunately that seems to have completely resolved any infection issues that she may have had. Unfortunately this is an ulcer that she has actually been dealing with since April 2018. She just initially was trying to treat it and care for it on her own. Her most recent hemoglobin A1c was 5.8, she is [name] non-smoker, she does have [name] right first toe amputation and [name] left third toe amputation. Fortunately she's not having any discomfort at the site. 05/17/17 on evaluation today patient's ulcer on the plantar aspect of the first metatarsal region appears to be close at this point. There does not appear to be any evidence of ulcer opening. This is on initial inspection. She has not really had any discomfort but honestly she really was not experiencing [name] lot of pain even before. There is no evidence of infection or fluctuation under the callous which is present at this point. ADMISSION 11/12/2019 This is [name] 71 year old woman that we previously had in this clinic in 2017 for two visits with [name] wound I think roughly in the same area. At that point she had [name] bunion in the left. She has apparently had surgery on this since then. Looking through care everywhere we can see that she has had follow-up with podiatry from 06/12/2019 through 10/28/2019 for [name] wound on the plantar aspect of her left first MTP. She states when this started she was having severe knee pain on  the right which forced her to alter her gait. She has recently received doxycycline which she is finished and Bactroban. She is back to using Medihoney on the wound. She is offloading with the Pegasys shoe. Past medical history includes type 2 diabetes with [name] recent hemoglobin A1c of 6.8, severe type II diabetic neuropathy, history of [name] CVA, stage III chronic renal failure hypertension, depression, of bilateral previous bunion surgery, right first toe amputation and [name] partial left third toe amputation for osteomyelitis. The patient states that she recently had work to do in her mother's house in [place] and so she was on her feet quite [name] lot which delayed the healing. ABI in our clinic was 1.1 on the left 8/31; plantar aspect of her left first metatarsal head. Using silver collagen. This is probably [name] surgical wound 9/14; this is [name] patient who had [name] wound on the plantar aspect of her left first metatarsal head in the setting of [name] significant bunion deformity. We use cervical collagen and [name] offloading shoe. She has significant diabetic neuropathy probably some degree of [name] Charcot deformity in her feet. This is closed over today. Readmission 03/30/2021 Ms. Aniya Jolicoeur is [name] 71 year old female with [name] past medical history of insulin-dependent controlled type 2 diabetes s/p right great toe amputation, right knee osteoarthritis and hypertension that presents to the clinic for [name] second right toe wound. She states she was getting out of the bathtub when her foot got caught the tub and the skin to her second toe was split open. She noticed this after the bath when she saw blood on the floor. She has neuropathy in her feet. She visited the ED following the event on 03/26/2021. They obtained x-rays that showed [name] fracture to the fifth toe but no fracture to the second toe. She was started on doxycycline. She currently denies signs of infection. She has been keeping the area covered with [name] Band-Aid. Readmission  10/21/2021 Ms. Amiya Gelinas is [name] 71 year old female with [name] past medical history of insulin dependent controlled type 2 diabetes complicated by peripheral neuropathy that presents to the clinic for [name] left met head wound. She has been following with Dr. Earleen Newport, podiatry for over [name] year for this issue. She has tried collagen, mupirocin ointment and Medihoney. She is at high fall risk and cannot tolerate [name] total contact cast. She has orthotics with specialized inserts for her current issue. She currently denies signs of infection. 8/10; patient presents for follow-up. Unfortunately Regranex was unaffordable at $1200. She has been using Medihoney to the wound bed. She reports aggressively offloading the area. She has offloading inserts. She denies signs of infection. 8/22; patient presents for follow-up. She has been using Medihoney to the wound bed with improvement in wound healing. She has no issues or complaints today. She will be out of town starting next week for the next 2 weeks. [name], [name] ([id number]) [id number] Page 3 of 11 9/11; patient presents for follow-up. She has been using Medihoney to the wound bed. She went on vacation and was last seen 3 weeks ago. She states she had tried to aggressively offload the wound bed. 9/26; patient presents for follow-up. She has been using Medihoney to the wound bed. She has no issues or complaints today. 10/13; patient presents for follow-up. She has been using Medihoney to the wound bed. She has no issues or complaints today. We rediscussed the possibility of [name] total contact cast but she does not feel comfortable as she is [name] high fall risk and lives alone. 10/26; patient presents for follow-up. Has been using Medihoney to the wound bed. She has been approved for Grafix however has [name] 20% co-pay. Patient declined proceeding with this due to financial cost. Patient has no issues or complaints today. 11/16; patient presents for  follow-up. She has been using Medihoney to the wound bed. She has been using her diabetic shoes with an offloading foam donut. She declines total contact cast placement today. 11/30; patient presents for follow-up. She has been using Medihoney to the wound bed. She did not receive silver alginate. She has been using the Pegasys with surgical shoe. There has been improvement in wound healing. 12/8; patient presents for follow-up. She has been using Medihoney and silver alginate to the wound bed. She has been using the Pegasys with surgical shoe without issues. 12/18; patient presents for follow-up. She has been using Medihoney and silver alginate to the wound bed. She continues to use her Pegasys with [name] surgical shoe. She has no issues or complaints today. 12/28; patient presents for follow-up. She has been using Medihoney and silver alginate to the wound bed. She continues to use her peg assist with [name] surgical shoe. She states she tried to aggressively offload the wound bed this week. She has no issues or complaints today. Electronic Signature(s) Signed: 03/17/2022 4:25:19 PM By: Holly Shan DO Entered By: [name] on 03/17/2022 13:29:25 -------------------------------------------------------------------------------- Physical Exam Details Patient Name: Date of Service: [name], [name]. 03/17/2022 12:30 PM Medical Record Number: [id number] Patient Account Number: [id number] Date of Birth/Sex: Treating RN: 1950/09/26 (71 y.o. F) Primary Care Provider: Harlan [name] Other Clinician: Referring Provider: Treating Provider/Extender: [name] Weeks in Treatment: 21 Constitutional respirations regular, non-labored and within target range for patient.. Cardiovascular 2+ dorsalis pedis/posterior tibialis pulses. Psychiatric pleasant and cooperative. Notes Left foot: T the left first metatarsal there is an open wound with granulation tissue,  nonviable  tissue and callus. Postdebridement there is healthy granulation o tissue. No surrounding signs of infection. Electronic Signature(s) Signed: 03/17/2022 4:25:19 PM By: Holly Shan DO Entered By: [name] on 03/17/2022 13:29:56 -------------------------------------------------------------------------------- Physician Orders Details Patient Name: Date of Service: [name], [name]. 03/17/2022 12:30 PM Medical Record Number: [id number] Patient Account Number: [id number] Date of Birth/Sex: Treating RN: 20-Sep-1950 (71 y.o. Benjaman Lobe Primary Care Provider: Harlan [name] Other Clinician: Jule Ser ([id number]) [id number] Page 4 of 11 Referring Provider: Treating Provider/Extender: [name] in Treatment: 21 Verbal / Phone Orders: No Diagnosis Coding Follow-up Appointments ppointment in 2 weeks. - w/ Dr. Heber Meila Berke Return [name] Return appointment in 3 weeks. - w/ Dr. Heber Barnsdall Other: - Prism=Supplies Anesthetic (In clinic) Topical Lidocaine 5% applied to wound bed (In clinic) Topical Lidocaine 4% applied to wound bed Cellular or Tissue Based Products Cellular or Tissue Based Product Type: - IVR for Grafix IVR for [employer]=$225 Copay Bathing/ Shower/ Hygiene May shower and wash wound with soap and water. Edema Control - Lymphedema / SCD / Other Avoid standing for long periods of time. Off-Loading Other: - Diabetic Shoes Relieve pressure/time standing of feet ***Use surgical shoe with peg assist insert with cutout for wound for offloading*** Additional Orders / Instructions Follow Nutritious Diet - Monitor/Control Blood Sugars Non Wound Condition Other Non Wound Condition Orders/Instructions: - Apply corn pad or gauze between great toe and 2nd toe. Wound Treatment Wound #4 - Metatarsal head first Wound Laterality: Plantar, Left Cleanser: Soap and Water 1 x Per Day/30 Days Discharge Instructions: May  shower and wash wound with dial antibacterial soap and water prior to dressing change. Cleanser: Wound Cleanser (Generic) 1 x Per Day/30 Days Discharge Instructions: Cleanse the wound with wound cleanser prior to applying [name] clean dressing using gauze sponges, not tissue or cotton balls. Peri-Wound Care: Skin Prep (Generic) 1 x Per Day/30 Days Discharge Instructions: Use skin prep as directed Prim Dressing: MediHoney Gel, tube 1.5 (oz) 1 x Per Day/30 Days ary Discharge Instructions: Apply until Regranex obtained Prim Dressing: Optifoam Non-Adhesive Dressing, 4x4 in (Generic) 1 x Per Day/30 Days ary Discharge Instructions: Apply as donut around wound Prim Dressing: KerraCel Ag Gelling Fiber Dressing, 4x5 in (silver alginate) (Generic) 1 x Per Day/30 Days ary Discharge Instructions: Apply silver alginate to wound bed as instructed Secondary Dressing: ALLEVYN Gentle Border, 3x3 (in/in) (Generic) 1 x Per Day/30 Days Discharge Instructions: Apply over primary dressing as directed. Electronic Signature(s) Signed: 03/17/2022 4:25:19 PM By: Holly Shan DO Entered By: [name] on 03/17/2022 13:30:05 -------------------------------------------------------------------------------- Problem List Details Patient Name: Date of Service: [name], [name]. 03/17/2022 12:30 PM [name], [name] ([id number]) [id number] Page 5 of 11 Medical Record Number: [id number] Patient Account Number: [id number] Date of Birth/Sex: Treating RN: 10/06/1950 (71 y.o. F) Primary Care Provider: Harlan [name] Other Clinician: Referring Provider: Treating Provider/Extender: [name] in Treatment: 21 Active Problems ICD-10 Encounter Code Description Active Date MDM Diagnosis L97.522 Non-pressure chronic ulcer of other part of left foot with fat layer exposed 10/21/2021 No Yes E11.621 Type 2 diabetes mellitus with foot ulcer 10/21/2021 No Yes E11.42 Type  2 diabetes mellitus with diabetic polyneuropathy 10/21/2021 No Yes Inactive Problems Resolved Problems Electronic Signature(s) Signed: 03/17/2022 4:25:19 PM By: Holly Shan DO Entered By: [name] on 03/17/2022 13:28:12 -------------------------------------------------------------------------------- Progress Note Details Patient Name: Date of Service: [name], [name]. 03/17/2022 12:30 PM Medical Record Number: [id number] Patient Account  Number: [id number] Date of Birth/Sex: Treating RN: Oct 13, 1950 (71 y.o. F) Primary Care Provider: Harlan [name] Other Clinician: Referring Provider: Treating Provider/Extender: [name] in Treatment: 21 Subjective Chief Complaint Information obtained from Patient Left plantar ulcer 11/12/2019; patient is here referred by podiatry for review of [name] wound on the left first plantar metatarsal head 03/30/2021; right second toe wound s/p Trauma 10/21/2021; First left metatarsal head wound History of Present Illness (HPI) 05/10/17 patient presents today for initial evaluation and our clinic concerning issues that she has been having with an ulceration on the plantar aspect of the left first metatarsal head. She has been seen [name] podiatrist Dr. Jacqualyn [name] for this since August 2018. Subsequently he has recommended bunion surgery as that seems to be the culprit for why there is pressure and friction occurring at the site causing the callous and subsequently the wound. 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 With that being said I do have notes from several of his visits one of which does include an x-ray documentation stating that she had [name] negative x-ray. There was obviously the bunion the notes I have extended from 11 deformity. With that being said she has not had an MRI fortunately this  wound does not probe to bone. The notes I have extended from February 08, 2017 through April 14, 2017. During that course she was also placed on antibiotics to help with an infection two weeks ago and this was doxycycline. Fortunately that seems to have completely resolved any infection issues that she may have had. Unfortunately this is an ulcer that she has actually been dealing with since April 2018. She just initially was trying to treat it and care for it on her own. Her most recent hemoglobin A1c was 5.8, she is [name] non-smoker, she does have [name] right first toe amputation and [name] left third toe amputation. Fortunately she's not having any discomfort at the site. 05/17/17 on evaluation today patient's ulcer on the plantar aspect of the first metatarsal region appears to be close at this point. There does not appear to be any evidence of ulcer opening. This is on initial inspection. She has not really had any discomfort but honestly she really was not experiencing [name] lot of pain even before. There is no evidence of infection or fluctuation under the callous which is present at this point. ADMISSION 11/12/2019 This is [name] 71 year old woman that we previously had in this clinic in 2017 for two visits with [name] wound I think roughly in the same area. At that point she had [name] MALGORZATA, ALBERT [name] ([id number]) [id number] Page 6 of 11 bunion in the left. She has apparently had surgery on this since then. Looking through care everywhere we can see that she has had follow-up with podiatry from 06/12/2019 through 10/28/2019 for [name] wound on the plantar aspect of her left first MTP. She states when this started she was having severe knee pain on the right which forced her to alter her gait. She has recently received doxycycline which she is finished and Bactroban. She is back to using Medihoney on the wound. She is offloading with the Pegasys shoe. Past medical history includes type 2 diabetes with  [name] recent hemoglobin A1c of 6.8, severe type II diabetic neuropathy, history of [name] CVA, stage III chronic renal failure hypertension, depression, of bilateral previous bunion  surgery, right first toe amputation and [name] partial left third toe amputation for osteomyelitis. The patient states that she recently had work to do in her mother's house in [place] and so she was on her feet quite [name] lot which delayed the healing. ABI in our clinic was 1.1 on the left 8/31; plantar aspect of her left first metatarsal head. Using silver collagen. This is probably [name] surgical wound 9/14; this is [name] patient who had [name] wound on the plantar aspect of her left first metatarsal head in the setting of [name] significant bunion deformity. We use cervical collagen and [name] offloading shoe. She has significant diabetic neuropathy probably some degree of [name] Charcot deformity in her feet. This is closed over today. Readmission 03/30/2021 Ms. Claressa Hughley is [name] 71 year old female with [name] past medical history of insulin-dependent controlled type 2 diabetes s/p right great toe amputation, right knee osteoarthritis and hypertension that presents to the clinic for [name] second right toe wound. She states she was getting out of the bathtub when her foot got caught the tub and the skin to her second toe was split open. She noticed this after the bath when she saw blood on the floor. She has neuropathy in her feet. She visited the ED following the event on 03/26/2021. They obtained x-rays that showed [name] fracture to the fifth toe but no fracture to the second toe. She was started on doxycycline. She currently denies signs of infection. She has been keeping the area covered with [name] Band-Aid. Readmission 10/21/2021 Ms. Kaia Potash is [name] 71 year old female with [name] past medical history of insulin dependent controlled type 2 diabetes complicated by peripheral neuropathy that presents to the clinic for [name] left met head wound. She has been following with Dr. Earleen Newport,  podiatry for over [name] year for this issue. She has tried collagen, mupirocin ointment and Medihoney. She is at high fall risk and cannot tolerate [name] total contact cast. She has orthotics with specialized inserts for her current issue. She currently denies signs of infection. 8/10; patient presents for follow-up. Unfortunately Regranex was unaffordable at $1200. She has been using Medihoney to the wound bed. She reports aggressively offloading the area. She has offloading inserts. She denies signs of infection. 8/22; patient presents for follow-up. She has been using Medihoney to the wound bed with improvement in wound healing. She has no issues or complaints today. She will be out of town starting next week for the next 2 weeks. 9/11; patient presents for follow-up. She has been using Medihoney to the wound bed. She went on vacation and was last seen 3 weeks ago. She states she had tried to aggressively offload the wound bed. 9/26; patient presents for follow-up. She has been using Medihoney to the wound bed. She has no issues or complaints today. 10/13; patient presents for follow-up. She has been using Medihoney to the wound bed. She has no issues or complaints today. We rediscussed the possibility of [name] total contact cast but she does not feel comfortable as she is [name] high fall risk and lives alone. 10/26; patient presents for follow-up. Has been using Medihoney to the wound bed. She has been approved for Grafix however has [name] 20% co-pay. Patient declined proceeding with this due to financial cost. Patient has no issues or complaints today. 11/16; patient presents for follow-up. She has been using Medihoney to the wound bed. She has been using her diabetic shoes with an offloading foam donut. She declines total contact cast placement  today. 11/30; patient presents for follow-up. She has been using Medihoney to the wound bed. She did not receive silver alginate. She has been using the Pegasys with  surgical shoe. There has been improvement in wound healing. 12/8; patient presents for follow-up. She has been using Medihoney and silver alginate to the wound bed. She has been using the Pegasys with surgical shoe without issues. 12/18; patient presents for follow-up. She has been using Medihoney and silver alginate to the wound bed. She continues to use her Pegasys with [name] surgical shoe. She has no issues or complaints today. 12/28; patient presents for follow-up. She has been using Medihoney and silver alginate to the wound bed. She continues to use her peg assist with [name] surgical shoe. She states she tried to aggressively offload the wound bed this week. She has no issues or complaints today. Patient History Information obtained from Patient. Family History Cancer - Father, Hypertension - Mother,Father,Siblings, Stroke - Mother,Father, No family history of Diabetes, Heart Disease, Hereditary Spherocytosis, Kidney Disease, Lung Disease, Seizures, Thyroid Problems, Tuberculosis. Social History Never smoker, Marital Status - Single, Alcohol Use - Never, Drug Use - No History, Caffeine Use - Rarely. Medical History Eyes Patient has history of Cataracts Denies history of Glaucoma, Optic Neuritis Ear/Nose/Mouth/Throat Denies history of Chronic sinus problems/congestion, Middle ear problems Hematologic/Lymphatic Denies history of Anemia, Hemophilia, Human Immunodeficiency Virus, Lymphedema, Sickle Cell Disease Respiratory Patient has history of Sleep Apnea Denies history of Aspiration, Asthma, Chronic Obstructive Pulmonary Disease (COPD), Pneumothorax, Tuberculosis Cardiovascular Patient has history of Hypertension Denies history of Angina, Arrhythmia, Congestive Heart Failure, Coronary Artery Disease, Deep Vein Thrombosis, Hypotension, Myocardial Infarction, Peripheral Arterial Disease, Peripheral Venous Disease, Phlebitis, Vasculitis Gastrointestinal Denies history of Cirrhosis , Colitis,  Crohnoos, Hepatitis [name], Hepatitis B, Hepatitis C Endocrine Patient has history of Type II Diabetes Denies history of Type I Diabetes Genitourinary Denies history of End Stage Renal Disease [name], [name] ([id number]) [id number] Page 7 of 11 Immunological Denies history of Lupus Erythematosus, Raynaudoos, Scleroderma Integumentary (Skin) Denies history of History of Burn Musculoskeletal Patient has history of Osteoarthritis, Osteomyelitis - Hx in 2007 (left 3rd toe) Denies history of Gout, Rheumatoid Arthritis Neurologic Patient has history of Neuropathy Denies history of Dementia, Quadriplegia, Paraplegia, Seizure Disorder Oncologic Patient has history of Received Radiation - 03/2020 Denies history of Received Chemotherapy Psychiatric Patient has history of Confinement Anxiety Denies history of Anorexia/bulimia Hospitalization/Surgery History - Amputation of right great toe. - Tendon repair. - Hysterectomy. - Osteomyelitis left 3rd toe. - cervical fusion with cage. - left breast lumpectomy. Medical [name] Surgical History Notes nd Constitutional Symptoms (General Health) morbid obesity Cardiovascular Takotsubo cardiomyopathy 2012, hyperlipidemia, Hx CVA, Tachycardia Genitourinary CKD stage 3 Musculoskeletal left leg weakness Neurologic CVA Oncologic hx endometrial cancer, left breast CA Psychiatric depression Objective Constitutional respirations regular, non-labored and within target range for patient.. Vitals Time Taken: 12:40 PM, Temperature: 98 F, Pulse: 89 bpm, Respiratory Rate: 18 breaths/min, Blood Pressure: 121/77 mmHg. Cardiovascular 2+ dorsalis pedis/posterior tibialis pulses. Psychiatric pleasant and cooperative. General Notes: Left foot: T the left first metatarsal there is an open wound with granulation tissue, nonviable tissue and callus. Postdebridement there is o healthy granulation tissue. No surrounding signs of  infection. Integumentary (Hair, Skin) Wound #4 status is Open. Original cause of wound was Gradually Appeared. The date acquired was: 10/02/2020. The wound has been in treatment 21 weeks. The wound is located on the Left,Plantar Metatarsal head first. The wound measures 0.4cm length x 0.1cm width x 0.2cm depth; 0.031cm^2  area and 0.006cm^3 volume. There is Fat Layer (Subcutaneous Tissue) exposed. There is no tunneling or undermining noted. There is [name] medium amount of serosanguineous drainage noted. The wound margin is distinct with the outline attached to the wound base. There is large (67-100%) pink, pale granulation within the wound bed. There is no necrotic tissue within the wound bed. The periwound skin appearance exhibited: Callus. The periwound skin appearance did not exhibit: Crepitus, Excoriation, Induration, Rash, Scarring, Dry/Scaly, Maceration, Atrophie Blanche, Cyanosis, Ecchymosis, Hemosiderin Staining, Mottled, Pallor, Rubor, Erythema. Periwound temperature was noted as No Abnormality. Assessment Active Problems ICD-10 Non-pressure chronic ulcer of other part of left foot with fat layer exposed Type 2 diabetes mellitus with foot ulcer Type 2 diabetes mellitus with diabetic polyneuropathy [name], [name] ([id number]) [id number] Page 8 of 11 Patient's wound has shown improvement in size and appearance since last clinic visit. I debrided nonviable tissue. I recommended continuing to aggressively offload the wound bed. Continue Pega assist insert with surgical shoe. Follow-up in 2 weeks. Procedures Wound #4 Pre-procedure diagnosis of Wound #4 is [name] Diabetic Wound/Ulcer of the Lower Extremity located on the Left,Plantar Metatarsal head first .Severity of Tissue Pre Debridement is: Fat layer exposed. There was [name] Excisional Skin/Subcutaneous Tissue Debridement with [name] total area of 0.04 sq cm performed by Holly Shan, DO. With the following instrument(s):  Curette to remove Viable and Non-Viable tissue/material. Material removed includes Callus, Subcutaneous Tissue, and Slough after achieving pain control using Lidocaine. No specimens were taken. [name] time out was conducted at 13:00, prior to the start of the procedure. [name] Minimum amount of bleeding was controlled with Pressure. The procedure was tolerated well with [name] pain level of 0 throughout and [name] pain level of 0 following the procedure. Post Debridement Measurements: 0.4cm length x 0.1cm width x 0.2cm depth; 0.006cm^3 volume. Character of Wound/Ulcer Post Debridement is improved. Severity of Tissue Post Debridement is: Fat layer exposed. Post procedure Diagnosis Wound #4: Same as Pre-Procedure Plan Follow-up Appointments: Return Appointment in 2 weeks. - w/ Dr. Heber [place] Return appointment in 3 weeks. - w/ Dr. Heber [place] Other: - Prism=Supplies Anesthetic: (In clinic) Topical Lidocaine 5% applied to wound bed (In clinic) Topical Lidocaine 4% applied to wound bed Cellular or Tissue Based Products: Cellular or Tissue Based Product Type: - IVR for Grafix IVR for [employer]=$225 [place] Bathing/ Shower/ Hygiene: May shower and wash wound with soap and water. Edema Control - Lymphedema / SCD / Other: Avoid standing for long periods of time. Off-Loading: Other: - Diabetic Shoes Relieve pressure/time standing of feet ***Use surgical shoe with peg assist insert with cutout for wound for offloading*** Additional Orders / Instructions: Follow Nutritious Diet - Monitor/Control Blood Sugars Non Wound Condition: Other Non Wound Condition Orders/Instructions: - Apply corn pad or gauze between great toe and 2nd toe. WOUND #4: - Metatarsal head first Wound Laterality: Plantar, Left Cleanser: Soap and Water 1 x Per Day/30 Days Discharge Instructions: May shower and wash wound with dial antibacterial soap and water prior to dressing change. Cleanser: Wound Cleanser (Generic) 1 x Per Day/30 Days Discharge  Instructions: Cleanse the wound with wound cleanser prior to applying [name] clean dressing using gauze sponges, not tissue or cotton balls. Peri-Wound Care: Skin Prep (Generic) 1 x Per Day/30 Days Discharge Instructions: Use skin prep as directed Prim Dressing: MediHoney Gel, tube 1.5 (oz) 1 x Per Day/30 Days ary Discharge Instructions: Apply until Regranex obtained Prim Dressing: Optifoam Non-Adhesive Dressing, 4x4 in (Generic) 1 x Per Day/30 Days ary Discharge Instructions:  Apply as donut around wound Prim Dressing: KerraCel Ag Gelling Fiber Dressing, 4x5 in (silver alginate) (Generic) 1 x Per Day/30 Days ary Discharge Instructions: Apply silver alginate to wound bed as instructed Secondary Dressing: ALLEVYN Gentle Border, 3x3 (in/in) (Generic) 1 x Per Day/30 Days Discharge Instructions: Apply over primary dressing as directed. 1. In office sharp debridement 2. Silver alginate with Medihoney 3. Aggressive offloadingoopeg assist and surgical shoe Electronic Signature(s) Signed: 03/17/2022 4:25:19 PM By: Holly Shan DO Entered By: [name] on 03/17/2022 13:31:46 -------------------------------------------------------------------------------- HxROS Details Patient Name: Date of Service: [name], [name]. 03/17/2022 12:30 PM Medical Record Number: [id number] Patient Account Number: [id number] Date of Birth/Sex: Treating RN: 1951-01-24 (71 y.o. F) Primary Care Provider: Harlan [name] Other Clinician: Referring Provider: Treating Provider/Extender: [name] [name], [name] ([id number]) [id number] Page 9 of 11 Weeks in Treatment: 21 Information Obtained From Patient Constitutional Symptoms (General Health) Medical History: Past Medical History Notes: morbid obesity Eyes Medical History: Positive for: Cataracts Negative for: Glaucoma; Optic Neuritis Ear/Nose/Mouth/Throat Medical History: Negative for: Chronic sinus  problems/congestion; Middle ear problems Hematologic/Lymphatic Medical History: Negative for: Anemia; Hemophilia; Human Immunodeficiency Virus; Lymphedema; Sickle Cell Disease Respiratory Medical History: Positive for: Sleep Apnea Negative for: Aspiration; Asthma; Chronic Obstructive Pulmonary Disease (COPD); Pneumothorax; Tuberculosis Cardiovascular Medical History: Positive for: Hypertension Negative for: Angina; Arrhythmia; Congestive Heart Failure; Coronary Artery Disease; Deep Vein Thrombosis; Hypotension; Myocardial Infarction; Peripheral Arterial Disease; Peripheral Venous Disease; Phlebitis; Vasculitis Past Medical History Notes: Takotsubo cardiomyopathy 2012, hyperlipidemia, Hx CVA, Tachycardia Gastrointestinal Medical History: Negative for: Cirrhosis ; Colitis; Crohns; Hepatitis [name]; Hepatitis B; Hepatitis C Endocrine Medical History: Positive for: Type II Diabetes Negative for: Type I Diabetes Time with diabetes: over 20 years Treated with: Insulin, Oral agents Blood sugar tested every day: Yes Tested : daily Genitourinary Medical History: Negative for: End Stage Renal Disease Past Medical History Notes: CKD stage 3 Immunological Medical History: Negative for: Lupus Erythematosus; Raynauds; Scleroderma Integumentary (Skin) Medical History: Negative for: History of Burn Musculoskeletal Medical History: Positive for: Osteoarthritis; Osteomyelitis - Hx in 2007 (left 3rd toe) Negative for: Gout; Rheumatoid Arthritis Past Medical History Notes: left leg weakness [name], [name] ([id number]) [id number] Page 10 of 11 Neurologic Medical History: Positive for: Neuropathy Negative for: Dementia; Quadriplegia; Paraplegia; Seizure Disorder Past Medical History Notes: CVA Oncologic Medical History: Positive for: Received Radiation - 03/2020 Negative for: Received Chemotherapy Past Medical History Notes: hx endometrial cancer, left breast  [place] Psychiatric Medical History: Positive for: Confinement Anxiety Negative for: Anorexia/bulimia Past Medical History Notes: depression HBO Extended History Items Eyes: Cataracts Immunizations Pneumococcal Vaccine: Received Pneumococcal Vaccination: Yes Received Pneumococcal Vaccination On or After 60th Birthday: Yes Implantable Devices None Hospitalization / Surgery History Type of Hospitalization/Surgery Amputation of right great toe Tendon repair Hysterectomy Osteomyelitis left 3rd toe cervical fusion with cage left breast lumpectomy Family and Social History Cancer: Yes - Father; Diabetes: No; Heart Disease: No; Hereditary Spherocytosis: No; Hypertension: Yes - Mother,Father,Siblings; Kidney Disease: No; Lung Disease: No; Seizures: No; Stroke: Yes - Mother,Father; Thyroid Problems: No; Tuberculosis: No; Never smoker; Marital Status - Single; Alcohol Use: Never; Drug Use: No History; Caffeine Use: Rarely; Financial Concerns: No; Food, Clothing or Shelter Needs: No; Support System Lacking: No; Transportation Concerns: No Electronic Signature(s) Signed: 03/17/2022 4:25:19 PM By: Holly Shan DO Entered By: [name] on 03/17/2022 13:29:32 -------------------------------------------------------------------------------- SuperBill Details Patient Name: Date of Service: [name], [name]. 03/17/2022 Medical Record Number: [id number] Patient Account Number: [id number] Date of Birth/Sex: Treating  RN: 04/01/1950 (71 y.o. [name], [name] Primary Care Provider: Harlan [name] Other Clinician: Referring Provider: Treating Provider/Extender: [name] Weeks in Treatment: [address], [place] [name] ([id number]) [id number] Page 11 of 11 ICD-10 Codes Code Description 3340182328 Non-pressure chronic ulcer of other part of left foot with fat layer exposed E11.621 Type 2 diabetes mellitus with foot ulcer E11.42  Type 2 diabetes mellitus with diabetic polyneuropathy Facility Procedures : CPT4 Code: [id number] Description: 33744 - DEB SUBQ TISSUE 20 SQ CM/< ICD-10 Diagnosis Description L97.522 Non-pressure chronic ulcer of other part of left foot with fat layer exposed Modifier: Quantity: 1 Physician Procedures : CPT4 Code Description Modifier [id number] 11042 - WC PHYS SUBQ TISS 20 SQ CM ICD-10 Diagnosis Description L97.522 Non-pressure chronic ulcer of other part of left foot with fat layer exposed Quantity: 1 Electronic Signature(s) Signed: 03/17/2022 4:25:19 PM By: Holly Shan DO Entered By: [name] on 03/17/2022 13:32:01

## 2022-03-30 DIAGNOSIS — H10013 Acute follicular conjunctivitis, bilateral: Secondary | ICD-10-CM | POA: Diagnosis not present

## 2022-03-31 ENCOUNTER — Encounter (HOSPITAL_BASED_OUTPATIENT_CLINIC_OR_DEPARTMENT_OTHER): Payer: PPO | Attending: Internal Medicine | Admitting: Internal Medicine

## 2022-04-07 ENCOUNTER — Encounter (HOSPITAL_BASED_OUTPATIENT_CLINIC_OR_DEPARTMENT_OTHER): Payer: PPO | Attending: Internal Medicine | Admitting: Internal Medicine

## 2022-04-07 DIAGNOSIS — E1122 Type 2 diabetes mellitus with diabetic chronic kidney disease: Secondary | ICD-10-CM | POA: Insufficient documentation

## 2022-04-07 DIAGNOSIS — E11621 Type 2 diabetes mellitus with foot ulcer: Secondary | ICD-10-CM | POA: Diagnosis not present

## 2022-04-07 DIAGNOSIS — M1990 Unspecified osteoarthritis, unspecified site: Secondary | ICD-10-CM | POA: Insufficient documentation

## 2022-04-07 DIAGNOSIS — E1142 Type 2 diabetes mellitus with diabetic polyneuropathy: Secondary | ICD-10-CM | POA: Diagnosis not present

## 2022-04-07 DIAGNOSIS — L97522 Non-pressure chronic ulcer of other part of left foot with fat layer exposed: Secondary | ICD-10-CM | POA: Insufficient documentation

## 2022-04-07 DIAGNOSIS — I129 Hypertensive chronic kidney disease with stage 1 through stage 4 chronic kidney disease, or unspecified chronic kidney disease: Secondary | ICD-10-CM | POA: Diagnosis not present

## 2022-04-07 DIAGNOSIS — N183 Chronic kidney disease, stage 3 unspecified: Secondary | ICD-10-CM | POA: Diagnosis not present

## 2022-04-07 DIAGNOSIS — E785 Hyperlipidemia, unspecified: Secondary | ICD-10-CM | POA: Diagnosis not present

## 2022-04-07 DIAGNOSIS — E1151 Type 2 diabetes mellitus with diabetic peripheral angiopathy without gangrene: Secondary | ICD-10-CM | POA: Insufficient documentation

## 2022-04-07 DIAGNOSIS — Z794 Long term (current) use of insulin: Secondary | ICD-10-CM | POA: Insufficient documentation

## 2022-04-07 DIAGNOSIS — Z89411 Acquired absence of right great toe: Secondary | ICD-10-CM | POA: Insufficient documentation

## 2022-04-07 DIAGNOSIS — G4730 Sleep apnea, unspecified: Secondary | ICD-10-CM | POA: Diagnosis not present

## 2022-04-11 ENCOUNTER — Ambulatory Visit (INDEPENDENT_AMBULATORY_CARE_PROVIDER_SITE_OTHER): Payer: PPO

## 2022-04-11 DIAGNOSIS — E1142 Type 2 diabetes mellitus with diabetic polyneuropathy: Secondary | ICD-10-CM

## 2022-04-11 NOTE — Progress Notes (Signed)
Patient presents to the office today for diabetic shoe and insole measuring.  Patient is currently seeing the wound center for a wound and will return to the office to see Dr Jacqualyn Posey for a 2024 DM shoe order.   Patient's diabetic provider: Delrae Rend  NPI: [id number]   Patient shoe selection-   1st   Shoe choice:   [place]

## 2022-04-12 ENCOUNTER — Encounter (HOSPITAL_BASED_OUTPATIENT_CLINIC_OR_DEPARTMENT_OTHER): Payer: PPO | Admitting: Internal Medicine

## 2022-04-12 DIAGNOSIS — E11621 Type 2 diabetes mellitus with foot ulcer: Secondary | ICD-10-CM

## 2022-04-12 DIAGNOSIS — L97522 Non-pressure chronic ulcer of other part of left foot with fat layer exposed: Secondary | ICD-10-CM | POA: Diagnosis not present

## 2022-04-13 NOTE — Progress Notes (Signed)
[name] ([id number]) [id number] Page 1 of 7 Visit Report for 04/12/2022 Arrival Information Details Patient Name: Date of Service: [name]. 04/12/2022 3:30 PM Medical Record Number: [id number] Patient Account Number: [id number] Date of Birth/Sex: Treating RN: 1950-10-06 (72 y.o. Tonita Phoenix, [name] Primary Care Karington Zarazua: [name] Other Clinician: Referring Elvis Laufer: Treating Alleyne Lac/Extender: [name] in Treatment: 24 Visit Information History Since Last Visit Added or deleted any medications: No Patient Arrived: [name] Any new allergies or adverse reactions: No Arrival Time: 15:39 Had [name] fall or experienced change in No Accompanied By: self activities of daily living that may affect Transfer Assistance: None risk of falls: Patient Identification Verified: Yes Signs or symptoms of abuse/neglect since last visito No Secondary Verification Process Completed: Yes Hospitalized since last visit: No Patient Requires Transmission-Based Precautions: No Implantable device outside of the clinic excluding No Patient Has Alerts: Yes cellular tissue based products placed in the center Patient Alerts: ABI's R=1.07 L=1.02 since last visit: Has Dressing in Place as Prescribed: Yes Pain Present Now: No Electronic Signature(s) Signed: 04/13/2022 4:09:44 PM By: Rhae Hammock RN Entered By: Rhae Hammock on 04/12/2022 15:39:48 -------------------------------------------------------------------------------- Encounter Discharge Information Details Patient Name: Date of Service: [name], [name]. 04/12/2022 3:30 PM Medical Record Number: [id number] Patient Account Number: [id number] Date of Birth/Sex: Treating RN: 05-May-1950 (72 y.o. Tonita Phoenix, [name] Primary Care Azriella Mattia: [name] Other Clinician: Referring Clemmie Buelna: Treating Deara Bober/Extender: [name] in Treatment:  24 Encounter Discharge Information Items Post Procedure Vitals Discharge Condition: Stable Temperature (F): 98.7 Ambulatory Status: Ambulatory Pulse (bpm): 74 Discharge Destination: Home Respiratory Rate (breaths/min): 17 Transportation: Private Auto Blood Pressure (mmHg): 120/80 Accompanied By: self Schedule Follow-up Appointment: Yes Clinical Summary of Care: Patient Declined Electronic Signature(s) Signed: 04/13/2022 4:09:44 PM By: Rhae Hammock RN Entered By: Rhae Hammock on 04/12/2022 15:58:03 [name], [name] ([id number]) [id number] Page 2 of 7 -------------------------------------------------------------------------------- Lower Extremity Assessment Details Patient Name: Date of Service: [name]. 04/12/2022 3:30 PM Medical Record Number: [id number] Patient Account Number: [id number] Date of Birth/Sex: Treating RN: 16-Apr-1950 (72 y.o. Tonita Phoenix, [name] Primary Care Abubakr Wieman: [name] Other Clinician: Referring Evangelynn Lochridge: Treating Uldine Fuster/Extender: [name] Weeks in Treatment: 24 Edema Assessment Assessed: [Left: Yes] [Right: No] Edema: [Left: N] [Right: o] Calf Left: Right: Point of Measurement: 34 cm From Medial Instep 48.5 cm Ankle Left: Right: Point of Measurement: 9 cm From Medial Instep 29.2 cm Vascular Assessment Pulses: Dorsalis Pedis Palpable: [Left:Yes] Posterior Tibial Palpable: [Left:Yes] Electronic Signature(s) Signed: 04/13/2022 4:09:44 PM By: Rhae Hammock RN Entered By: Rhae Hammock on 04/12/2022 15:42:13 -------------------------------------------------------------------------------- Multi Wound Chart Details Patient Name: Date of Service: [name], [name]. 04/12/2022 3:30 PM Medical Record Number: [id number] Patient Account Number: [id number] Date of Birth/Sex: Treating RN: 01/21/1951 (72 y.o. F) Primary Care Kolleen Ochsner: [name] Other  Clinician: Referring Madesyn Ast: Treating Antwoine Zorn/Extender: [name] in Treatment: 24 Vital Signs Height(in): Pulse(bpm): 61 Weight(lbs): Blood Pressure(mmHg): 117/74 Body Mass Index(BMI): Temperature(F): 98.7 Respiratory Rate(breaths/min): 17 [4:Photos:] [N/[name]:N/[name]] Left, Plantar Metatarsal head first N/[name] N/[name] Wound Location: Gradually Appeared N/[name] N/[name] Wounding Event: Diabetic Wound/Ulcer of the Lower N/[name] N/[name] Primary Etiology: Extremity Cataracts, Sleep Apnea, Hypertension, N/[name] N/[name] Comorbid History: Type II Diabetes, Osteoarthritis, Osteomyelitis, Neuropathy, Received Radiation, Confinement Anxiety 10/02/2020 N/[name] N/[name] Date Acquired: 24 N/[name] N/[name] Weeks of Treatment: Open N/[name] N/[name] Wound Status: No N/[name] N/[name] Wound Recurrence: 0.7x0.6x0.3 N/[name] N/[name] Measurements L x W  x D (cm) 0.33 N/[name] N/[name] [name] (cm) : rea 0.099 N/[name] N/[name] Volume (cm) : 19.10% N/[name] N/[name] % Reduction in [name] rea: 19.50% N/[name] N/[name] % Reduction in Volume: 12 Starting Position 1 (o'clock): 12 Ending Position 1 (o'clock): 0.2 Maximum Distance 1 (cm): Yes N/[name] N/[name] Undermining: Grade 1 N/[name] N/[name] Classification: Medium N/[name] N/[name] Exudate [name] mount: Serosanguineous N/[name] N/[name] Exudate Type: red, brown N/[name] N/[name] Exudate Color: Distinct, outline attached N/[name] N/[name] Wound Margin: Large (67-100%) N/[name] N/[name] Granulation [name] mount: Pink, Pale N/[name] N/[name] Granulation Quality: None Present (0%) N/[name] N/[name] Necrotic [name] mount: Fat Layer (Subcutaneous Tissue): Yes N/[name] N/[name] Exposed Structures: Fascia: No Tendon: No Muscle: No Joint: No Bone: No Large (67-100%) N/[name] N/[name] Epithelialization: Debridement - Excisional N/[name] N/[name] Debridement: Pre-procedure Verification/Time Out 15:48 N/[name] N/[name] Taken: Lidocaine N/[name] N/[name] Pain Control: Callus, Subcutaneous, Slough N/[name] N/[name] Tissue Debrided: Skin/Subcutaneous Tissue N/[name] N/[name] Level: 0.42 N/[name] N/[name] Debridement [name] (sq cm): rea Curette N/[name] N/[name] Instrument: Minimum N/[name]  N/[name] Bleeding: Pressure N/[name] N/[name] Hemostasis [name] chieved: 0 N/[name] N/[name] Procedural Pain: 0 N/[name] N/[name] Post Procedural Pain: Procedure was tolerated well N/[name] N/[name] Debridement Treatment Response: 0.7x0.6x0.3 N/[name] N/[name] Post Debridement Measurements L x W x D (cm) 0.099 N/[name] N/[name] Post Debridement Volume: (cm) Callus: Yes N/[name] N/[name] Periwound Skin Texture: Excoriation: No Induration: No Crepitus: No Rash: No Scarring: No Maceration: No N/[name] N/[name] Periwound Skin Moisture: Dry/Scaly: No Atrophie Blanche: No N/[name] N/[name] Periwound Skin Color: Cyanosis: No Ecchymosis: No Erythema: No Hemosiderin Staining: No Mottled: No Pallor: No Rubor: No No Abnormality N/[name] N/[name] Temperature: Debridement N/[name] N/[name] Procedures Performed: Treatment Notes Electronic Signature(s) Signed: 04/12/2022 4:21:39 PM By: [name] DO Entered By: [name] on 04/12/2022 15:52:37 [name], [name] ([id number]) [id number] Page 4 of 7 -------------------------------------------------------------------------------- Multi-Disciplinary Care Plan Details Patient Name: Date of Service: [name]. 04/12/2022 3:30 PM Medical Record Number: [id number] Patient Account Number: [id number] Date of Birth/Sex: Treating RN: September 29, 1950 (72 y.o. Tonita Phoenix, [name] Primary Care Evanna Washinton: [name] Other Clinician: Referring Navaya Wiatrek: Treating Dorenda Pfannenstiel/Extender: [name] in Treatment: 24 Active Inactive Nutrition Nursing Diagnoses: Impaired glucose control: actual or potential Goals: Patient/caregiver verbalizes understanding of need to maintain therapeutic glucose control per primary care physician Date Initiated: 10/21/2021 Target Resolution Date: 04/16/2022 Goal Status: Active Interventions: Assess HgA1c results as ordered upon admission and as needed Provide education on elevated blood sugars and impact on wound healing Notes: Wound/Skin Impairment Nursing  Diagnoses: Impaired tissue integrity Goals: Patient/caregiver will verbalize understanding of skin care regimen Date Initiated: 10/21/2021 Target Resolution Date: 04/23/2022 Goal Status: Active Ulcer/skin breakdown will have [name] volume reduction of 30% by week 4 Date Initiated: 10/21/2021 Date Inactivated: 11/29/2021 Target Resolution Date: 11/18/2021 Goal Status: Met Interventions: Assess patient/caregiver ability to obtain necessary supplies Assess patient/caregiver ability to perform ulcer/skin care regimen upon admission and as needed Assess ulceration(s) every visit Provide education on ulcer and skin care Treatment Activities: Topical wound management initiated : 10/21/2021 Notes: 11/29/21: Wound care regimen continues. Running IVR for skin sub Electronic Signature(s) Signed: 04/13/2022 4:09:44 PM By: Rhae Hammock RN Entered By: Rhae Hammock on 04/12/2022 15:47:13 -------------------------------------------------------------------------------- Pain Assessment Details Patient Name: Date of Service: [name], [name]. 04/12/2022 3:30 PM Medical Record Number: [id number] Patient Account Number: [id number] [name], [name] ([id number]) [id number] Page 5 of 7 Date of Birth/Sex: Treating RN: 04-04-1950 (72 y.o. Benjaman Lobe Primary Care Naseem Adler: Other Clinician: [name] Referring Ayleen Mckinstry: Treating Lafe Clerk/Extender: [name]  Weeks in Treatment: 24 Active Problems Location of Pain Severity and Description of Pain Patient Has Paino No Site Locations Pain Management and Medication Current Pain Management: Electronic Signature(s) Signed: 04/13/2022 4:09:44 PM By: Rhae Hammock RN Entered By: Rhae Hammock on 04/12/2022 15:42:04 -------------------------------------------------------------------------------- Patient/Caregiver Education Details Patient Name: Date of Service: [name]  1/23/2024andnbsp3:30 PM Medical Record Number: [id number] Patient Account Number: [id number] Date of Birth/Gender: Treating RN: 1950/10/10 (72 y.o. Tonita Phoenix, [name] Primary Care Physician: [name] Other Clinician: Referring Physician: Treating Physician/Extender: [name] in Treatment: 24 Education Assessment Education Provided To: Patient Education Topics Provided Wound/Skin Impairment: Methods: Explain/Verbal Responses: Reinforcements needed, State content correctly [employer]) Signed: 04/13/2022 4:09:44 PM By: Rhae Hammock RN Entered By: Rhae Hammock on 04/12/2022 15:48:02 Kibble, [name] ([id number]) [id number] Page 6 of 7 -------------------------------------------------------------------------------- Wound Assessment Details Patient Name: Date of Service: [name]. 04/12/2022 3:30 PM Medical Record Number: [id number] Patient Account Number: [id number] Date of Birth/Sex: Treating RN: 08-24-1950 (72 y.o. Tonita Phoenix, [name] Primary Care Zsofia Prout: [name] Other Clinician: Referring Acey Woodfield: Treating Chase Arnall/Extender: [name] Weeks in Treatment: 24 Wound Status Wound Number: 4 Primary Diabetic Wound/Ulcer of the Lower Extremity Etiology: Wound Location: Left, Plantar Metatarsal head first Wound Open Wounding Event: Gradually Appeared Status: Date Acquired: 10/02/2020 Comorbid Cataracts, Sleep Apnea, Hypertension, Type II Diabetes, Weeks Of Treatment: 24 History: Osteoarthritis, Osteomyelitis, Neuropathy, Received Radiation, Clustered Wound: No Confinement Anxiety Photos Wound Measurements Length: (cm) 0.7 Width: (cm) 0.6 Depth: (cm) 0.3 Area: (cm) 0.33 Volume: (cm) 0.099 % Reduction in Area: 19.1% % Reduction in Volume: 19.5% Epithelialization: Large (67-100%) Tunneling: No Undermining: Yes Starting Position (o'clock): 12 Ending  Position (o'clock): 12 Maximum Distance: (cm) 0.2 Wound Description Classification: Grade 1 Wound Margin: Distinct, outline attached Exudate Amount: Medium Exudate Type: Serosanguineous Exudate Color: red, brown Foul Odor After Cleansing: No Slough/Fibrino No Wound Bed Granulation Amount: Large (67-100%) Exposed Structure Granulation Quality: Pink, Pale Fascia Exposed: No Necrotic Amount: None Present (0%) Fat Layer (Subcutaneous Tissue) Exposed: Yes Tendon Exposed: No Muscle Exposed: No Joint Exposed: No Bone Exposed: No Periwound Skin Texture Texture Color No Abnormalities Noted: No No Abnormalities Noted: No Callus: Yes Atrophie Blanche: No Crepitus: No Cyanosis: No Excoriation: No Ecchymosis: No Induration: No Erythema: No Rash: No Hemosiderin Staining: No Scarring: No Mottled: No Pallor: No Moisture Rubor: No No Abnormalities Noted: No Dry / Scaly: No Temperature / Pain [name], [name] ([id number]) [id number] Page 7 of 7 Maceration: No Temperature: No Abnormality Treatment Notes Wound #4 (Metatarsal head first) Wound Laterality: Plantar, Left Cleanser Soap and Water Discharge Instruction: May shower and wash wound with dial antibacterial soap and water prior to dressing change. Wound Cleanser Discharge Instruction: Cleanse the wound with wound cleanser prior to applying [name] clean dressing using gauze sponges, not tissue or cotton balls. Peri-Wound Care Skin Prep Discharge Instruction: Use skin prep as directed Topical BlastX Primary Dressing Hydrofera Blue Ready Transfer Foam, 2.5x2.5 (in/in) Discharge Instruction: Apply directly to wound bed as directed Secondary Dressing ALLEVYN Gentle Border, 3x3 (in/in) Discharge Instruction: Apply over primary dressing as directed. Optifoam Non-Adhesive Dressing, 4x4 in Discharge Instruction: Apply foam donut over primary dressing as directed. Secured With Compression  Wrap Compression Stockings Environmental education officer) Signed: 04/13/2022 4:09:44 PM By: Rhae Hammock RN Entered By: Rhae Hammock on 04/12/2022 15:44:59 -------------------------------------------------------------------------------- Vitals Details Patient Name: Date of Service: [name], [name]. 04/12/2022 3:30 PM Medical Record Number: [id number] Patient  Account Number: [id number] Date of Birth/Sex: Treating RN: 08-Apr-1950 (72 y.o. Tonita Phoenix, [name] Primary Care Atleigh Gruen: [name] Other Clinician: Referring Jhalil Silvera: Treating Nikea Settle/Extender: [name] Weeks in Treatment: 24 Vital Signs Time Taken: 15:41 Temperature (F): 98.7 Pulse (bpm): 74 Respiratory Rate (breaths/min): 17 Blood Pressure (mmHg): 117/74 Reference Range: 80 - 120 mg / dl Electronic Signature(s) Signed: 04/13/2022 4:09:44 PM By: Rhae Hammock RN Entered By: Rhae Hammock on 04/12/2022 15:41:59

## 2022-04-13 NOTE — Progress Notes (Signed)
TIONNA, GIGANTE [name] ([id number]) [id number] Page 1 of 7 Visit Report for 04/07/2022 Arrival Information Details Patient Name: Date of Service: [name]. 04/07/2022 12:30 PM Medical Record Number: [id number] Patient Account Number: [id number] Date of Birth/Sex: Treating RN: 1950-08-24 (72 y.o. [name], [name] Primary Care Shandrell Boda: [name] Other Clinician: Referring Pierrette Scheu: Treating Yates Weisgerber/Extender: [name] in Treatment: 24 Visit Information History Since Last Visit Added or deleted any medications: No Patient Arrived: [name] Any new allergies or adverse reactions: No Arrival Time: 12:45 Had [name] fall or experienced change in No Accompanied By: self activities of daily living that may affect Transfer Assistance: None risk of falls: Patient Identification Verified: Yes Signs or symptoms of abuse/neglect since last visito No Secondary Verification Process Completed: Yes Hospitalized since last visit: No Patient Requires Transmission-Based Precautions: No Implantable device outside of the clinic excluding No Patient Has Alerts: Yes cellular tissue based products placed in the center Patient Alerts: ABI's R=1.07 L=1.02 since last visit: Has Dressing in Place as Prescribed: Yes Pain Present Now: No Electronic Signature(s) Signed: 04/13/2022 4:09:44 PM By: Rhae Hammock RN Entered By: Rhae Hammock on 04/07/2022 12:46:14 -------------------------------------------------------------------------------- Complex / Palliative Patient Assessment Details Patient Name: Date of Service: [name], [name]. 04/07/2022 12:30 PM Medical Record Number: [id number] Patient Account Number: [id number] Date of Birth/Sex: Treating RN: 1950-08-18 (72 y.o. [name] Primary Care Reeves Musick: [name] Other Clinician: Referring Dail Meece: Treating Oran Dillenburg/Extender: [name] Weeks in  Treatment: 24 Complex Wound Management Criteria Patient has remarkable or complex co-morbidities requiring medications or treatments that extend wound healing times. Examples: Diabetes mellitus with chronic renal failure or end stage renal disease requiring dialysis Advanced or poorly controlled rheumatoid arthritis Diabetes mellitus and end stage chronic obstructive pulmonary disease Active cancer with current chemo- or radiation therapy HTN, SA,CVA, Dm type II, CKD stage III, OA, osteomyelitis, Hx of Ca, takotsub cardiomyopathy Palliative Wound Management Criteria Care Approach Wound Care Plan: Complex Wound Management Electronic Signature(s) Signed: 04/08/2022 10:16:27 PM By: Deon Pilling RN, BSN Signed: 04/11/2022 1:08:39 PM By: [name] DO Entered By: Deon Pilling on 04/08/2022 22:16:27 [name], [name] ([id number]) [id number] Page 2 of 7 -------------------------------------------------------------------------------- Encounter Discharge Information Details Patient Name: Date of Service: [name]. 04/07/2022 12:30 PM Medical Record Number: [id number] Patient Account Number: [id number] Date of Birth/Sex: Treating RN: 08/08/1950 (72 y.o. [name], [name] Primary Care Cass Edinger: [name] Other Clinician: Referring Kylin Genna: Treating Berneice Zettlemoyer/Extender: [name] in Treatment: 24 Encounter Discharge Information Items Post Procedure Vitals Discharge Condition: Stable Temperature (F): 98.7 Ambulatory Status: Ambulatory Pulse (bpm): 74 Discharge Destination: Home Respiratory Rate (breaths/min): 17 Transportation: Private Auto Blood Pressure (mmHg): 120/80 Accompanied By: self Schedule Follow-up Appointment: Yes Clinical Summary of Care: Patient Declined Electronic Signature(s) Signed: 04/13/2022 4:09:44 PM By: Rhae Hammock RN Entered By: Rhae Hammock on 04/07/2022  13:24:29 -------------------------------------------------------------------------------- Lower Extremity Assessment Details Patient Name: Date of Service: [name], [name]. 04/07/2022 12:30 PM Medical Record Number: [id number] Patient Account Number: [id number] Date of Birth/Sex: Treating RN: [birth year] (72 y.o. [name], [name] Primary Care Penne Rosenstock: [name] Other Clinician: Referring Ernesteen Mihalic: Treating Arnett Duddy/Extender: [name] Weeks in Treatment: 24 Edema Assessment Assessed: [Left: Yes] [Right: No] Edema: [Left: N] [Right: o] Calf Left: Right: Point of Measurement: 34 cm From Medial Instep 48.5 cm Ankle Left: Right: Point of Measurement: 9 cm From Medial Instep 29.2 cm Vascular Assessment Pulses: Dorsalis Pedis  Palpable: [Left:Yes] Posterior Tibial Palpable: [Left:Yes] Electronic Signature(s) Signed: 04/13/2022 4:09:44 PM By: Rhae Hammock RN Entered By: Rhae Hammock on 04/07/2022 12:50:50 Oren, [name] ([id number]) [id number] Page 3 of 7 -------------------------------------------------------------------------------- Multi Wound Chart Details Patient Name: Date of Service: [name]. 04/07/2022 12:30 PM Medical Record Number: [id number] Patient Account Number: [id number] Date of Birth/Sex: Treating RN: Aug 10, 1950 (72 y.o. F) Primary Care Jazma Pickel: [name] Other Clinician: Referring Gadge Hermiz: Treating Mayer Vondrak/Extender: [name] Weeks in Treatment: 24 Vital Signs Height(in): Pulse(bpm): 74 Weight(lbs): Blood Pressure(mmHg): 125/79 Body Mass Index(BMI): Temperature(F): 98..7 Respiratory Rate(breaths/min): 17 [4:Photos:] [N/[name]:N/[name]] Left, Plantar Metatarsal head first N/[name] N/[name] Wound Location: Gradually Appeared N/[name] N/[name] Wounding Event: Diabetic Wound/Ulcer of the Lower N/[name] N/[name] Primary Etiology: Extremity Cataracts, Sleep Apnea, Hypertension, N/[name]  N/[name] Comorbid History: Type II Diabetes, Osteoarthritis, Osteomyelitis, Neuropathy, Received Radiation, Confinement Anxiety 10/02/2020 N/[name] N/[name] Date Acquired: 24 N/[name] N/[name] Weeks of Treatment: Open N/[name] N/[name] Wound Status: No N/[name] N/[name] Wound Recurrence: 0.3x0.2x0.3 N/[name] N/[name] Measurements L x W x D (cm) 0.047 N/[name] N/[name] [name] (cm) : rea 0.014 N/[name] N/[name] Volume (cm) : 88.50% N/[name] N/[name] % Reduction in [name] rea: 88.60% N/[name] N/[name] % Reduction in Volume: 12 Starting Position 1 (o'clock): 12 Ending Position 1 (o'clock): 0.3 Maximum Distance 1 (cm): Yes N/[name] N/[name] Undermining: Grade 1 N/[name] N/[name] Classification: Medium N/[name] N/[name] Exudate [name] mount: Serosanguineous N/[name] N/[name] Exudate Type: red, brown N/[name] N/[name] Exudate Color: Distinct, outline attached N/[name] N/[name] Wound Margin: Large (67-100%) N/[name] N/[name] Granulation [name] mount: Pink, Pale N/[name] N/[name] Granulation Quality: None Present (0%) N/[name] N/[name] Necrotic [name] mount: Fat Layer (Subcutaneous Tissue): Yes N/[name] N/[name] Exposed Structures: Fascia: No Tendon: No Muscle: No Joint: No Bone: No Large (67-100%) N/[name] N/[name] Epithelialization: Debridement - Excisional N/[name] N/[name] Debridement: Pre-procedure Verification/Time Out 13:14 N/[name] N/[name] Taken: Lidocaine N/[name] N/[name] Pain Control: Callus, Subcutaneous, Slough N/[name] N/[name] Tissue Debrided: Skin/Subcutaneous Tissue N/[name] N/[name] Level: 0.06 N/[name] N/[name] Debridement [name] (sq cm): rea Curette N/[name] N/[name] Instrument: Minimum N/[name] N/[name] Bleeding: Pressure N/[name] N/[name] Hemostasis [name] chieved: 0 N/[name] N/[name] Procedural PainTISHANA, CLINKENBEARD [name] ([id number]) [id number] Page 4 of 7 0 N/[name] N/[name] Post Procedural Pain: Procedure was tolerated well N/[name] N/[name] Debridement Treatment Response: 0.3x0.2x0.3 N/[name] N/[name] Post Debridement Measurements L x W x D (cm) 0.014 N/[name] N/[name] Post Debridement Volume: (cm) Callus: Yes N/[name] N/[name] Periwound Skin Texture: Excoriation: No Induration: No Crepitus: No Rash: No Scarring: No Maceration: No N/[name] N/[name] Periwound Skin  Moisture: Dry/Scaly: No Atrophie Blanche: No N/[name] N/[name] Periwound Skin Color: Cyanosis: No Ecchymosis: No Erythema: No Hemosiderin Staining: No Mottled: No Pallor: No Rubor: No No Abnormality N/[name] N/[name] Temperature: Debridement N/[name] N/[name] Procedures Performed: Treatment Notes Electronic Signature(s) Signed: 04/07/2022 3:08:43 PM By: [name] DO Entered By: [name] on 04/07/2022 13:23:16 -------------------------------------------------------------------------------- Multi-Disciplinary Care Plan Details Patient Name: Date of Service: [name], [name]. 04/07/2022 12:30 PM Medical Record Number: [id number] Patient Account Number: [id number] Date of Birth/Sex: Treating RN: [birth year] (72 y.o. [name], [name] Primary Care Eila Runyan: [name] Other Clinician: Referring Aminta Sakurai: Treating Caidence Higashi/Extender: [name] in Treatment: 24 Active Inactive Nutrition Nursing Diagnoses: Impaired glucose control: actual or potential Goals: Patient/caregiver verbalizes understanding of need to maintain therapeutic glucose control per primary care physician Date Initiated: 10/21/2021 Target Resolution Date: 03/19/2022 Goal Status: Active Interventions: Assess HgA1c results as ordered upon admission and as needed Provide education on elevated blood sugars and impact on wound healing Notes: Wound/Skin Impairment Nursing  Diagnoses: Impaired tissue integrity Goals: Patient/caregiver will verbalize understanding of skin care regimen Date Initiated: 10/21/2021 Target Resolution Date: 03/19/2022 Goal Status: Active Ulcer/skin breakdown will have [name] volume reduction of 30% by week 4 Date Initiated: 10/21/2021 Date Inactivated: 11/29/2021 Target Resolution Date: 11/18/2021 AMONI, SCALLAN ([id number]) [id number].pdf Page 5 of 7 Goal Status: Met Interventions: Assess patient/caregiver ability to obtain necessary supplies Assess  patient/caregiver ability to perform ulcer/skin care regimen upon admission and as needed Assess ulceration(s) every visit Provide education on ulcer and skin care Treatment Activities: Topical wound management initiated : 10/21/2021 Notes: 11/29/21: Wound care regimen continues. Running IVR for skin sub Electronic Signature(s) Signed: 04/13/2022 4:09:44 PM By: Rhae Hammock RN Entered By: Rhae Hammock on 04/07/2022 13:21:53 -------------------------------------------------------------------------------- Pain Assessment Details Patient Name: Date of Service: [name], [name]. 04/07/2022 12:30 PM Medical Record Number: [id number] Patient Account Number: [id number] Date of Birth/Sex: Treating RN: [birth year] (72 y.o. [name], [name] Primary Care Nemesio Castrillon: [name] Other Clinician: Referring Nettye Flegal: Treating Mickie Kozikowski/Extender: [name] Weeks in Treatment: 24 Active Problems Location of Pain Severity and Description of Pain Patient Has Paino No Site Locations Pain Management and Medication Current Pain Management: Electronic Signature(s) Signed: 04/13/2022 4:09:44 PM By: Rhae Hammock RN Entered By: Rhae Hammock on 04/07/2022 12:48:13 Vernon, [name] ([id number]) [id number] Page 6 of 7 -------------------------------------------------------------------------------- Patient/Caregiver Education Details Patient Name: Date of Service: [name] 1/18/2024andnbsp12:30 PM Medical Record Number: [id number] Patient Account Number: [id number] Date of Birth/Gender: Treating RN: 03/26/1950 (72 y.o. [name], [name] Primary Care Physician: [name] Other Clinician: Referring Physician: Treating Physician/Extender: [name] in Treatment: 24 Education Assessment Education Provided To: Patient Education Topics Provided Wound/Skin Impairment: Methods:  Explain/Verbal Responses: Reinforcements needed, State content correctly [employer]) Signed: 04/13/2022 4:09:44 PM By: Rhae Hammock RN Entered By: Rhae Hammock on 04/07/2022 13:22:03 -------------------------------------------------------------------------------- Wound Assessment Details Patient Name: Date of Service: [name], [name]. 04/07/2022 12:30 PM Medical Record Number: [id number] Patient Account Number: [id number] Date of Birth/Sex: Treating RN: 1951/01/27 (72 y.o. [name], [name] Primary Care Koree Schopf: [name] Other Clinician: Referring Niya Behler: Treating Jaidah Lomax/Extender: [name] Weeks in Treatment: 24 Wound Status Wound Number: 4 Primary Diabetic Wound/Ulcer of the Lower Extremity Etiology: Wound Location: Left, Plantar Metatarsal head first Wound Open Wounding Event: Gradually Appeared Status: Date Acquired: 10/02/2020 Comorbid Cataracts, Sleep Apnea, Hypertension, Type II Diabetes, Weeks Of Treatment: 24 History: Osteoarthritis, Osteomyelitis, Neuropathy, Received Radiation, Clustered Wound: No Confinement Anxiety Photos Wound Measurements Length: (cm) 0.3 Width: (cm) 0.2 Depth: (cm) 0.3 Area: (cm) 0.047 Volume: (cm) 0.014 Agne, [name] ([id number]) % Reduction in Area: 88.5% % Reduction in Volume: 88.6% Epithelialization: Large (67-100%) Tunneling: No Undermining: Yes Starting Position (o'clock): 12 Ending Position (o'clock): 12 Maximum Distance: (cm) 0.3 [id number] Page 7 of 7 Wound Description Classification: Grade 1 Wound Margin: Distinct, outline attached Exudate Amount: Medium Exudate Type: Serosanguineous Exudate Color: red, brown Foul Odor After Cleansing: No Slough/Fibrino No Wound Bed Granulation Amount: Large (67-100%) Exposed Structure Granulation Quality: Pink, Pale Fascia Exposed: No Necrotic Amount: None Present (0%) Fat Layer (Subcutaneous  Tissue) Exposed: Yes Tendon Exposed: No Muscle Exposed: No Joint Exposed: No Bone Exposed: No Periwound Skin Texture Texture Color No Abnormalities Noted: No No Abnormalities Noted: No Callus: Yes Atrophie Blanche: No Crepitus: No Cyanosis: No Excoriation: No Ecchymosis: No Induration: No Erythema: No Rash: No Hemosiderin Staining: No Scarring: No Mottled: No Pallor: No Moisture Rubor: No No Abnormalities  Noted: No Dry / Scaly: No Temperature / Pain Maceration: No Temperature: No Abnormality Electronic Signature(s) Signed: 04/13/2022 4:09:44 PM By: Rhae Hammock RN Entered By: Rhae Hammock on 04/07/2022 12:53:04 -------------------------------------------------------------------------------- Vitals Details Patient Name: Date of Service: [name], [name]. 04/07/2022 12:30 PM Medical Record Number: [id number] Patient Account Number: [id number] Date of Birth/Sex: Treating RN: [birth year] (72 y.o. [name] Primary Care Lillie Portner: [name] Other Clinician: Referring Clyde Upshaw: Treating Harue Pribble/Extender: [name] Weeks in Treatment: 24 Vital Signs Time Taken: 12:46 Temperature (F): 98..7 Pulse (bpm): 74 Respiratory Rate (breaths/min): 17 Blood Pressure (mmHg): 125/79 Reference Range: 80 - 120 mg / dl Electronic Signature(s) Signed: 04/13/2022 4:09:44 PM By: Rhae Hammock RN Entered By: Rhae Hammock on 04/07/2022 12:48:09

## 2022-04-13 NOTE — Progress Notes (Signed)
Holly [name] [name] ([id number]) [id number] Page 1 of 11 Visit Report for 04/12/2022 Chief Complaint Document Details Patient Name: Date of Service: Holly [name] 04/12/2022 3:30 PM Medical Record Number: [id number] Patient Account Number: [id number] Date of Birth/Sex: Treating RN: Holly [name] (72 y.o. F) Primary Care Provider: Harlan [name] Other Clinician: Referring Provider: Treating Provider/Extender: Holly [name] in Treatment: 24 Information Obtained from: Patient Chief Complaint Left plantar ulcer 11/12/2019; patient is here referred by podiatry for review of [name] wound on the left first plantar metatarsal head 03/30/2021; right second toe wound s/p Trauma 10/21/2021; First left metatarsal head wound Electronic Signature(s) Signed: 04/12/2022 4:21:39 PM By: Holly Shan DO Entered By: Holly [name] on 04/12/2022 15:52:44 -------------------------------------------------------------------------------- Debridement Details Patient Name: Date of Service: Holly [name], Holly [name]. 04/12/2022 3:30 PM Medical Record Number: [id number] Patient Account Number: [id number] Date of Birth/Sex: Treating RN: [birth year] (72 y.o. Holly [name], Holly [name] Primary Care Provider: Harlan [name] Other Clinician: Referring Provider: Treating Provider/Extender: Holly [name] in Treatment: 24 Debridement Performed for Assessment: Wound #4 Left,Plantar Metatarsal head first Performed By: Physician Holly Shan, DO Debridement Type: Debridement Severity of Tissue Pre Debridement: Fat layer exposed Level of Consciousness (Pre-procedure): Awake and Alert Pre-procedure Verification/Time Out Yes - 15:48 Taken: Start Time: 15:48 Pain Control: Lidocaine T Area Debrided (L x W): otal 0.7 (cm) x 0.6 (cm) = 0.42 (cm) Tissue and other material debrided: Viable, Non-Viable, Callus, Slough, Subcutaneous, Slough Level:  Skin/Subcutaneous Tissue Debridement Description: Excisional Instrument: Curette Bleeding: Minimum Hemostasis Achieved: Pressure End Time: 15:48 Procedural Pain: 0 Post Procedural Pain: 0 Response to Treatment: Procedure was tolerated well Level of Consciousness (Post- Awake and Alert procedure): Post Debridement Measurements of Total Wound Length: (cm) 0.7 Width: (cm) 0.6 Depth: (cm) 0.3 Volume: (cm) 0.099 Holly [name] ([id number]) [id number] Page 2 of 11 Character of Wound/Ulcer Post Debridement: Improved Severity of Tissue Post Debridement: Fat layer exposed Post Procedure Diagnosis Same as Pre-procedure Electronic Signature(s) Signed: 04/12/2022 4:21:39 PM By: Holly Shan DO Signed: 04/13/2022 4:09:44 PM By: Holly Hammock RN Entered By: Holly [name] on 04/12/2022 15:52:08 -------------------------------------------------------------------------------- HPI Details Patient Name: Date of Service: Holly [name], Holly [name]. 04/12/2022 3:30 PM Medical Record Number: [id number] Patient Account Number: [id number] Date of Birth/Sex: Treating RN: [name] (72 y.o. F) Primary Care Provider: Harlan [name] Other Clinician: Referring Provider: Treating Provider/Extender: Holly [name] in Treatment: 24 History of Present Illness HPI Description: 05/10/17 patient presents today for initial evaluation and our clinic concerning issues that she has been having with an ulceration on the plantar aspect of the left first metatarsal head. She has been seen [name] for this since August 2018. Subsequently he has recommended bunion surgery as that seems to be the culprit for why there is pressure and friction occurring at the site causing the callous and subsequently the wound. 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 With that being said I do have notes from several of his visits one of which does include an x-ray documentation stating that she had [name] negative x-ray. There was obviously the bunion the notes I have extended from 11 deformity. With that being said she has not had an MRI fortunately this wound does not probe to bone. The notes I have extended from February 08, 2017 through April 14, 2017. During that course she was also placed on antibiotics to help with an infection two [name] ago and this was doxycycline. Fortunately that seems to have completely resolved any infection issues that she may have had. Unfortunately this is an ulcer that she has actually been dealing with since April 2018. She just initially was trying to treat it and care for it on her own. Her most recent hemoglobin A1c was 5.8, she is [name] non-smoker, she does have [name] right first toe amputation and [name] left third toe amputation. Fortunately she's not having any discomfort at the site. 05/17/17 on evaluation today patient's ulcer on the plantar aspect of the first metatarsal region appears to be close at this point. There does not appear to be any evidence of ulcer opening. This is on initial inspection. She has not really had any discomfort but honestly she really was not experiencing [name] lot of pain even before. There is no evidence of infection or fluctuation under the callous which is present at this point. ADMISSION 11/12/2019 This is [name] 72 year old woman that we previously had in this clinic in 2017 for two visits with [name] wound I think roughly in the same area. At that point she had [name] bunion in the left. She has apparently had surgery on this since then. Looking through care everywhere we can see that she has had follow-up with podiatry from 06/12/2019 through 10/28/2019 for [name] wound on the plantar aspect of her left first MTP. She states when this started she was having severe knee pain on the right  which forced her to alter her gait. She has recently received doxycycline which she is finished and Bactroban. She is back to using Medihoney on the wound. She is offloading with the Pegasys shoe. Past medical history includes type 2 diabetes with [name] recent hemoglobin A1c of 6.8, severe type II diabetic neuropathy, history of [name] CVA, stage III chronic renal failure hypertension, depression, of bilateral previous bunion surgery, right first toe amputation and [name] partial left third toe amputation for osteomyelitis. The patient states that she recently had work to do in her mother's house in [place] and so she was on her feet quite [name] lot which delayed the healing. ABI in our clinic was 1.1 on the left 8/31; plantar aspect of her left first metatarsal head. Using silver collagen. This is probably [name] surgical wound 9/14; this is [name] patient who had [name] wound on the plantar aspect of her left first metatarsal head in the setting of [name] significant bunion deformity. We use cervical collagen and [name] offloading shoe. She has significant diabetic neuropathy probably some degree of [name] Charcot deformity in her feet. This is closed over today. Readmission 03/30/2021 Holly [name] is [name] 72 year old female with [name] past medical history of insulin-dependent controlled type 2 diabetes s/p right great toe amputation, right knee osteoarthritis and hypertension that presents to the clinic for [name] second right toe wound. She states she was getting out of the bathtub when her foot got caught the tub and the skin to her second toe was split open. She noticed this after the bath when she saw blood on the floor. She has neuropathy in her feet. She visited the ED following the event on 03/26/2021. They obtained x-rays that showed [name] fracture to the fifth toe but no fracture to the second toe. She was started on doxycycline. She currently denies signs of infection. She has been keeping the area covered with [name] Band-Aid. Readmission  10/21/2021 Holly [name] is [name] 72 year old female with [name] past medical history of insulin dependent controlled type 2 diabetes complicated by peripheral neuropathy that presents to the clinic for [name] left met head wound. She has been following with Dr. Earleen Newport, podiatry for over [name] year for this issue. She has tried collagen, mupirocin ointment and Medihoney. She is at high fall risk and cannot tolerate [name] total contact cast. She has orthotics with specialized inserts for her current issue. She currently denies signs of infection. 8/10; patient presents for follow-up. Unfortunately Regranex was unaffordable at $1200. She has been using Medihoney to the wound bed. She reports aggressively offloading the area. She has offloading inserts. She denies signs of infection. 8/22; patient presents for follow-up. She has been using Medihoney to the wound bed with improvement in wound healing. She has no issues or complaints today. She will be out of town starting next week for the next 2 [name]. Holly [name], Holly [name] ([id number]) [id number] Page 3 of 11 9/11; patient presents for follow-up. She has been using Medihoney to the wound bed. She went on vacation and was last seen 3 [name] ago. She states she had tried to aggressively offload the wound bed. 9/26; patient presents for follow-up. She has been using Medihoney to the wound bed. She has no issues or complaints today. 10/13; patient presents for follow-up. She has been using Medihoney to the wound bed. She has no issues or complaints today. We rediscussed the possibility of [name] total contact cast but she does not feel comfortable as she is [name] high fall risk and lives alone. 10/26; patient presents for follow-up. Has been using Medihoney to the wound bed. She has been approved for Grafix however has [name] 20% co-pay. Patient declined proceeding with this due to financial cost. Patient has no issues or complaints today. 11/16; patient presents for  follow-up. She has been using Medihoney to the wound bed. She has been using her diabetic shoes with an offloading foam donut. She declines total contact cast placement today. 11/30; patient presents for follow-up. She has been using Medihoney to the wound bed. She did not receive silver alginate. She has been using the Pegasys with surgical shoe. There has been improvement in wound healing. 12/8; patient presents for follow-up. She has been using Medihoney and silver alginate to the wound bed. She has been using the Pegasys with surgical shoe without issues. 12/18; patient presents for follow-up. She has been using Medihoney and silver alginate to the wound bed. She continues to use her Pegasys with [name] surgical shoe. She has no issues or complaints today. 12/28; patient presents for follow-up. She has been using Medihoney and silver alginate to the wound bed. She continues to use her peg assist with [name] surgical shoe. She states she tried to aggressively offload the wound bed this week. She has no issues or complaints today. 1/18; patient missed her last clinic appointment due to conjunctivitis. She has been using Medihoney and silver alginate to the wound bed. She continues to use her peg assist with her surgical shoe. 1/23; patient presents for follow-up. She has been using blast X and Hydrofera Blue to the wound bed. She uses her peg assist with her surgical shoe. She has no issues or complaints today. Electronic Signature(s) Signed: 04/12/2022 4:21:39 PM By: Holly Shan DO Entered By: Holly [name] on 04/12/2022 15:53:11 -------------------------------------------------------------------------------- Physical Exam Details Patient Name: Date of Service: Holly [name], Holly Dess RA H [name]. 04/12/2022 3:30 PM Medical Record Number: [id number] Patient  Account Number: [id number] Date of Birth/Sex: Treating RN: [birth year] [name] (72 y.o. F) Primary Care Provider: Harlan [name] Other Clinician: Referring  Provider: Treating Provider/Extender: Holly [name] in Treatment: 24 Constitutional respirations regular, non-labored and within target range for patient.. Cardiovascular 2+ dorsalis pedis/posterior tibialis pulses. Psychiatric pleasant and cooperative. Notes Left foot: T the left first metatarsal there is an open wound with granulation tissue, nonviable tissue and callus. Postdebridement there is healthy granulation o tissue. No surrounding signs of infection. Electronic Signature(s) Signed: 04/12/2022 4:21:39 PM By: Holly Shan DO Entered By: Holly [name] on 04/12/2022 15:53:37 Dimitri, Holly [name] ([id number]) [id number] Page 4 of 11 -------------------------------------------------------------------------------- Physician Orders Details Patient Name: Date of Service: Melton Alar [name]. 04/12/2022 3:30 PM Medical Record Number: [id number] Patient Account Number: [id number] Date of Birth/Sex: Treating RN: [name] (72 y.o. Benjaman Lobe Primary Care Provider: Harlan [name] Other Clinician: Referring Provider: Treating Provider/Extender: Holly [name] in Treatment: 24 Verbal / Phone Orders: No Diagnosis Coding Follow-up Appointments ppointment in 1 week. - w/ Dr. Heber [place] Tuesday 04/19/22 @ 1515 Rm # 9 w/ La[facility] Return [name] ppointment in 2 [name]. - w/ Dr. Heber [place] (go ahead and make an appt.) Return [name] Other: - Prism=Supplies Anesthetic (In clinic) Topical Lidocaine 5% applied to wound bed (In clinic) Topical Lidocaine 4% applied to wound bed Cellular or Tissue Based Products Cellular or Tissue Based Product Type: - IVR for Grafix IVR for [employer]=$225 Copay Bathing/ Shower/ Hygiene May shower and wash wound with soap and water. Edema Control - Lymphedema / SCD / Other Avoid standing for long periods of time. Off-Loading Other: - Diabetic Shoes Relieve pressure/time standing of  feet ***Use surgical shoe with peg assist insert with cutout for wound for offloading*** Additional Orders / Instructions Follow Nutritious Diet - Monitor/Control Blood Sugars Non Wound Condition Other Non Wound Condition Orders/Instructions: - Apply corn pad or gauze between great toe and 2nd toe. Wound Treatment Wound #4 - Metatarsal head first Wound Laterality: Plantar, Left Cleanser: Soap and Water 1 x Per Day/30 Days Discharge Instructions: May shower and wash wound with dial antibacterial soap and water prior to dressing change. Cleanser: Wound Cleanser (Generic) 1 x Per Day/30 Days Discharge Instructions: Cleanse the wound with wound cleanser prior to applying [name] clean dressing using gauze sponges, not tissue or cotton balls. Peri-Wound Care: Skin Prep (Generic) 1 x Per Day/30 Days Discharge Instructions: Use skin prep as directed Topical: BlastX 1 x Per Day/30 Days Prim Dressing: Hydrofera Blue Ready Transfer Foam, 2.5x2.5 (in/in) 1 x Per Day/30 Days ary Discharge Instructions: Apply directly to wound bed as directed Secondary Dressing: ALLEVYN Gentle Border, 3x3 (in/in) (Generic) 1 x Per Day/30 Days Discharge Instructions: Apply over primary dressing as directed. Secondary Dressing: Optifoam Non-Adhesive Dressing, 4x4 in 1 x Per Day/30 Days Discharge Instructions: Apply foam donut over primary dressing as directed. Electronic Signature(s) Signed: 04/12/2022 4:21:39 PM By: Holly Shan DO Entered By: Holly [name] on 04/12/2022 15:53:45 Talley, Holly [name] ([id number]) [id number] Page 5 of 11 -------------------------------------------------------------------------------- Problem List Details Patient Name: Date of Service: Melton Alar [name]. 04/12/2022 3:30 PM Medical Record Number: [id number] Patient Account Number: [id number] Date of Birth/Sex: Treating RN: [name][birth year] (72 y.o. F) Primary Care Provider: Harlan [name] Other Clinician: Referring  Provider: Treating Provider/Extender: Holly [name] in Treatment: 24 Active Problems ICD-10 Encounter Code Description Active Date MDM Diagnosis L97.522 Non-pressure chronic ulcer of other part of left foot with fat layer exposed 10/21/2021 No  Yes E11.621 Type 2 diabetes mellitus with foot ulcer 10/21/2021 No Yes E11.42 Type 2 diabetes mellitus with diabetic polyneuropathy 10/21/2021 No Yes Inactive Problems Resolved Problems Electronic Signature(s) Signed: 04/12/2022 4:21:39 PM By: Holly Shan DO Entered By: Holly [name] on 04/12/2022 15:52:32 -------------------------------------------------------------------------------- Progress Note Details Patient Name: Date of Service: Holly [name], Holly [name]. 04/12/2022 3:30 PM Medical Record Number: [id number] Patient Account Number: [id number] Date of Birth/Sex: Treating RN: [name][birth year] (72 y.o. F) Primary Care Provider: Harlan [name] Other Clinician: Referring Provider: Treating Provider/Extender: Holly [name] in Treatment: 24 Subjective Chief Complaint Information obtained from Patient Left plantar ulcer 11/12/2019; patient is here referred by podiatry for review of [name] wound on the left first plantar metatarsal head 03/30/2021; right second toe wound s/p Trauma 10/21/2021; First left metatarsal head wound History of Present Illness (HPI) 05/10/17 patient presents today for initial evaluation and our clinic concerning issues that she has been having with an ulceration on the plantar aspect of the left first metatarsal head. She has been seen [name] for this since August 2018. Subsequently he has recommended bunion surgery as that seems to be the culprit for why there is pressure and friction occurring at the site causing the callous and subsequently the wound. 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 With that being said I do have notes from several of his visits one of which does include an x-ray documentation stating that she had [name] negative x-ray. There was obviously the bunion the notes I have extended from 11 deformity. With that being said she has not had an MRI fortunately this wound does not probe to bone. The notes I have extended from February 08, 2017 through April 14, 2017. During that course she was also placed on antibiotics to help with an infection two [name] ago and this was doxycycline. Fortunately that seems to have completely resolved any infection issues that she may have had. Unfortunately this is an ulcer that she has actually been dealing with since April 2018. She just initially was trying to treat it and care for it on her own. Her most recent ANDRES, ESCANDON [name] ([id number]) [id number] Page 6 of 11 hemoglobin A1c was 5.8, she is [name] non-smoker, she does have [name] right first toe amputation and [name] left third toe amputation. Fortunately she's not having any discomfort at the site. 05/17/17 on evaluation today patient's ulcer on the plantar aspect of the first metatarsal region appears to be close at this point. There does not appear to be any evidence of ulcer opening. This is on initial inspection. She has not really had any discomfort but honestly she really was not experiencing [name] lot of pain even before. There is no evidence of infection or fluctuation under the callous which is present at this point. ADMISSION 11/12/2019 This is [name] 72 year old woman that we previously had in this clinic in 2017 for two visits with [name] wound I think roughly in the same area. At that point she had [name] bunion in the left. She has apparently had surgery on this since then. Looking through care everywhere we can see that she has had follow-up with podiatry from 06/12/2019 through 10/28/2019 for [name] wound on the  plantar aspect of her left first MTP. She states when this started she was having severe knee pain on the right  which forced her to alter her gait. She has recently received doxycycline which she is finished and Bactroban. She is back to using Medihoney on the wound. She is offloading with the Pegasys shoe. Past medical history includes type 2 diabetes with [name] recent hemoglobin A1c of 6.8, severe type II diabetic neuropathy, history of [name] CVA, stage III chronic renal failure hypertension, depression, of bilateral previous bunion surgery, right first toe amputation and [name] partial left third toe amputation for osteomyelitis. The patient states that she recently had work to do in her mother's house in [place] and so she was on her feet quite [name] lot which delayed the healing. ABI in our clinic was 1.1 on the left 8/31; plantar aspect of her left first metatarsal head. Using silver collagen. This is probably [name] surgical wound 9/14; this is [name] patient who had [name] wound on the plantar aspect of her left first metatarsal head in the setting of [name] significant bunion deformity. We use cervical collagen and [name] offloading shoe. She has significant diabetic neuropathy probably some degree of [name] Charcot deformity in her feet. This is closed over today. Readmission 03/30/2021 Ms. Keanu Frickey is [name] 72 year old female with [name] past medical history of insulin-dependent controlled type 2 diabetes s/p right great toe amputation, right knee osteoarthritis and hypertension that presents to the clinic for [name] second right toe wound. She states she was getting out of the bathtub when her foot got caught the tub and the skin to her second toe was split open. She noticed this after the bath when she saw blood on the floor. She has neuropathy in her feet. She visited the ED following the event on 03/26/2021. They obtained x-rays that showed [name] fracture to the fifth toe but no fracture to the second toe. She was started on doxycycline. She  currently denies signs of infection. She has been keeping the area covered with [name] Band-Aid. Readmission 10/21/2021 Ms. Keeli Termine is [name] 72 year old female with [name] past medical history of insulin dependent controlled type 2 diabetes complicated by peripheral neuropathy that presents to the clinic for [name] left met head wound. She has been following with Dr. Earleen Newport, podiatry for over [name] year for this issue. She has tried collagen, mupirocin ointment and Medihoney. She is at high fall risk and cannot tolerate [name] total contact cast. She has orthotics with specialized inserts for her current issue. She currently denies signs of infection. 8/10; patient presents for follow-up. Unfortunately Regranex was unaffordable at $1200. She has been using Medihoney to the wound bed. She reports aggressively offloading the area. She has offloading inserts. She denies signs of infection. 8/22; patient presents for follow-up. She has been using Medihoney to the wound bed with improvement in wound healing. She has no issues or complaints today. She will be out of town starting next week for the next 2 [name]. 9/11; patient presents for follow-up. She has been using Medihoney to the wound bed. She went on vacation and was last seen 3 [name] ago. She states she had tried to aggressively offload the wound bed. 9/26; patient presents for follow-up. She has been using Medihoney to the wound bed. She has no issues or complaints today. 10/13; patient presents for follow-up. She has been using Medihoney to the wound bed. She has no issues or complaints today. We rediscussed the possibility of [name] total contact cast but she does not feel comfortable as she is [name] high fall risk and lives alone. 10/26; patient presents for  follow-up. Has been using Medihoney to the wound bed. She has been approved for Grafix however has [name] 20% co-pay. Patient declined proceeding with this due to financial cost. Patient has no issues or complaints  today. 11/16; patient presents for follow-up. She has been using Medihoney to the wound bed. She has been using her diabetic shoes with an offloading foam donut. She declines total contact cast placement today. 11/30; patient presents for follow-up. She has been using Medihoney to the wound bed. She did not receive silver alginate. She has been using the Pegasys with surgical shoe. There has been improvement in wound healing. 12/8; patient presents for follow-up. She has been using Medihoney and silver alginate to the wound bed. She has been using the Pegasys with surgical shoe without issues. 12/18; patient presents for follow-up. She has been using Medihoney and silver alginate to the wound bed. She continues to use her Pegasys with [name] surgical shoe. She has no issues or complaints today. 12/28; patient presents for follow-up. She has been using Medihoney and silver alginate to the wound bed. She continues to use her peg assist with [name] surgical shoe. She states she tried to aggressively offload the wound bed this week. She has no issues or complaints today. 1/18; patient missed her last clinic appointment due to conjunctivitis. She has been using Medihoney and silver alginate to the wound bed. She continues to use her peg assist with her surgical shoe. 1/23; patient presents for follow-up. She has been using blast X and Hydrofera Blue to the wound bed. She uses her peg assist with her surgical shoe. She has no issues or complaints today. Patient History Information obtained from Patient. Family History Cancer - Father, Hypertension - Mother,Father,Siblings, Stroke - Mother,Father, No family history of Diabetes, Heart Disease, Hereditary Spherocytosis, Kidney Disease, Lung Disease, Seizures, Thyroid Problems, Tuberculosis. Social History Never smoker, Marital Status - Single, Alcohol Use - Never, Drug Use - No History, Caffeine Use - Rarely. Medical History Eyes Patient has history of  Cataracts Denies history of Glaucoma, Optic Neuritis Ear/Nose/Mouth/Throat CRYSTALMARIE, YASIN [name] ([id number]) [id number] Page 7 of 11 Denies history of Chronic sinus problems/congestion, Middle ear problems Hematologic/Lymphatic Denies history of Anemia, Hemophilia, Human Immunodeficiency Virus, Lymphedema, Sickle Cell Disease Respiratory Patient has history of Sleep Apnea Denies history of Aspiration, Asthma, Chronic Obstructive Pulmonary Disease (COPD), Pneumothorax, Tuberculosis Cardiovascular Patient has history of Hypertension Denies history of Angina, Arrhythmia, Congestive Heart Failure, Coronary Artery Disease, Deep Vein Thrombosis, Hypotension, Myocardial Infarction, Peripheral Arterial Disease, Peripheral Venous Disease, Phlebitis, Vasculitis Gastrointestinal Denies history of Cirrhosis , Colitis, Crohnoos, Hepatitis [name], Hepatitis B, Hepatitis C Endocrine Patient has history of Type II Diabetes Denies history of Type I Diabetes Genitourinary Denies history of End Stage Renal Disease Immunological Denies history of Lupus Erythematosus, Raynaudoos, Scleroderma Integumentary (Skin) Denies history of History of Burn Musculoskeletal Patient has history of Osteoarthritis, Osteomyelitis - Hx in 2007 (left 3rd toe) Denies history of Gout, Rheumatoid Arthritis Neurologic Patient has history of Neuropathy Denies history of Dementia, Quadriplegia, Paraplegia, Seizure Disorder Oncologic Patient has history of Received Radiation - 03/2020 Denies history of Received Chemotherapy Psychiatric Patient has history of Confinement Anxiety Denies history of Anorexia/bulimia Hospitalization/Surgery History - Amputation of right great toe. - Tendon repair. - Hysterectomy. - Osteomyelitis left 3rd toe. - cervical fusion with cage. - left breast lumpectomy. Medical [name] Surgical History Notes nd Constitutional Symptoms (General Health) morbid  obesity Cardiovascular Takotsubo cardiomyopathy 2012, hyperlipidemia, Hx CVA, Tachycardia Genitourinary CKD stage 3 Musculoskeletal left leg  weakness Neurologic CVA Oncologic hx endometrial cancer, left breast CA Psychiatric depression Objective Constitutional respirations regular, non-labored and within target range for patient.. Vitals Time Taken: 3:41 PM, Temperature: 98.7 F, Pulse: 74 bpm, Respiratory Rate: 17 breaths/min, Blood Pressure: 117/74 mmHg. Cardiovascular 2+ dorsalis pedis/posterior tibialis pulses. Psychiatric pleasant and cooperative. General Notes: Left foot: T the left first metatarsal there is an open wound with granulation tissue, nonviable tissue and callus. Postdebridement there is o healthy granulation tissue. No surrounding signs of infection. Integumentary (Hair, Skin) Wound #4 status is Open. Original cause of wound was Gradually Appeared. The date acquired was: 10/02/2020. The wound has been in treatment 24 [name]. The wound is located on the Left,Plantar Metatarsal head first. The wound measures 0.7cm length x 0.6cm width x 0.3cm depth; 0.33cm^2 area and 0.099cm^3 volume. There is Fat Layer (Subcutaneous Tissue) exposed. There is no tunneling noted, however, there is undermining starting at 12:00 and ending at 12:00 with [name] maximum distance of 0.2cm. There is [name] medium amount of serosanguineous drainage noted. The wound margin is distinct with the outline attached to the wound base. There is large (67-100%) pink, pale granulation within the wound bed. There is no necrotic tissue within the wound bed. The periwound skin appearance exhibited: Callus. The periwound skin appearance did not exhibit: Crepitus, Excoriation, Induration, Rash, Scarring, Dry/Scaly, Maceration, Atrophie Blanche, Cyanosis, Ecchymosis, Hemosiderin Staining, Mottled, Pallor, Rubor, Erythema. Periwound temperature was noted as No Abnormality. Holly [name], Holly [name] ([id number])  [id number] Page 8 of 11 Assessment Active Problems ICD-10 Non-pressure chronic ulcer of other part of left foot with fat layer exposed Type 2 diabetes mellitus with foot ulcer Type 2 diabetes mellitus with diabetic polyneuropathy Patient's wound is stable. I debrided nonviable tissue. Postdebridement healthy granulated wound bed. I recommended continuing with blast X and Hydrofera Blue. Continue aggressive offloading with surgical shoe and peg assist. Follow-up in 1 week. Procedures Wound #4 Pre-procedure diagnosis of Wound #4 is [name] Diabetic Wound/Ulcer of the Lower Extremity located on the Left,Plantar Metatarsal head first .Severity of Tissue Pre Debridement is: Fat layer exposed. There was [name] Excisional Skin/Subcutaneous Tissue Debridement with [name] total area of 0.42 sq cm performed by Holly Shan, DO. With the following instrument(s): Curette to remove Viable and Non-Viable tissue/material. Material removed includes Callus, Subcutaneous Tissue, and Slough after achieving pain control using Lidocaine. No specimens were taken. [name] time out was conducted at 15:48, prior to the start of the procedure. [name] Minimum amount of bleeding was controlled with Pressure. The procedure was tolerated well with [name] pain level of 0 throughout and [name] pain level of 0 following the procedure. Post Debridement Measurements: 0.7cm length x 0.6cm width x 0.3cm depth; 0.099cm^3 volume. Character of Wound/Ulcer Post Debridement is improved. Severity of Tissue Post Debridement is: Fat layer exposed. Post procedure Diagnosis Wound #4: Same as Pre-Procedure Plan Follow-up Appointments: Return Appointment in 1 week. - w/ Dr. Heber Lavalette Tuesday 04/19/22 @ 1515 Rm # 9 w/ Lauran Return Appointment in 2 [name]. - w/ Dr. Heber Parral (go ahead and make an appt.) Other: - Prism=Supplies Anesthetic: (In clinic) Topical Lidocaine 5% applied to wound bed (In clinic) Topical Lidocaine 4% applied to wound  bed Cellular or Tissue Based Products: Cellular or Tissue Based Product Type: - IVR for Grafix IVR for [employer]=$225 Copay Bathing/ Shower/ Hygiene: May shower and wash wound with soap and water. Edema Control - Lymphedema / SCD / Other: Avoid standing for long periods of time. Off-Loading: Other: - Diabetic Shoes Relieve pressure/time standing of feet ***  Use surgical shoe with peg assist insert with cutout for wound for offloading*** Additional Orders / Instructions: Follow Nutritious Diet - Monitor/Control Blood Sugars Non Wound Condition: Other Non Wound Condition Orders/Instructions: - Apply corn pad or gauze between great toe and 2nd toe. WOUND #4: - Metatarsal head first Wound Laterality: Plantar, Left Cleanser: Soap and Water 1 x Per Day/30 Days Discharge Instructions: May shower and wash wound with dial antibacterial soap and water prior to dressing change. Cleanser: Wound Cleanser (Generic) 1 x Per Day/30 Days Discharge Instructions: Cleanse the wound with wound cleanser prior to applying [name] clean dressing using gauze sponges, not tissue or cotton balls. Peri-Wound Care: Skin Prep (Generic) 1 x Per Day/30 Days Discharge Instructions: Use skin prep as directed Topical: BlastX 1 x Per Day/30 Days Prim Dressing: Hydrofera Blue Ready Transfer Foam, 2.5x2.5 (in/in) 1 x Per Day/30 Days ary Discharge Instructions: Apply directly to wound bed as directed Secondary Dressing: ALLEVYN Gentle Border, 3x3 (in/in) (Generic) 1 x Per Day/30 Days Discharge Instructions: Apply over primary dressing as directed. Secondary Dressing: Optifoam Non-Adhesive Dressing, 4x4 in 1 x Per Day/30 Days Discharge Instructions: Apply foam donut over primary dressing as directed. 1. In office sharp debridement 2. Blast X and Hydrofera Blue 3. Aggressive offloadingoosurgical shoe with peg assist 4. Follow-up in 1 week Electronic Signature(s) Signed: 04/12/2022 4:21:39 PM By: Holly Shan DO Entered By:  Holly [name] on 04/12/2022 15:54:49 Grunert, Holly [name] ([id number]) [id number] Page 9 of 11 -------------------------------------------------------------------------------- HxROS Details Patient Name: Date of Service: Melton Alar [name]. 04/12/2022 3:30 PM Medical Record Number: [id number] Patient Account Number: [id number] Date of Birth/Sex: Treating RN: [birth year] (72 y.o. F) Primary Care Provider: Harlan [name] Other Clinician: Referring Provider: Treating Provider/Extender: Holly [name] in Treatment: 24 Information Obtained From Patient Constitutional Symptoms (General Health) Medical History: Past Medical History Notes: morbid obesity Eyes Medical History: Positive for: Cataracts Negative for: Glaucoma; Optic Neuritis Ear/Nose/Mouth/Throat Medical History: Negative for: Chronic sinus problems/congestion; Middle ear problems Hematologic/Lymphatic Medical History: Negative for: Anemia; Hemophilia; Human Immunodeficiency Virus; Lymphedema; Sickle Cell Disease Respiratory Medical History: Positive for: Sleep Apnea Negative for: Aspiration; Asthma; Chronic Obstructive Pulmonary Disease (COPD); Pneumothorax; Tuberculosis Cardiovascular Medical History: Positive for: Hypertension Negative for: Angina; Arrhythmia; Congestive Heart Failure; Coronary Artery Disease; Deep Vein Thrombosis; Hypotension; Myocardial Infarction; Peripheral Arterial Disease; Peripheral Venous Disease; Phlebitis; Vasculitis Past Medical History Notes: Takotsubo cardiomyopathy 2012, hyperlipidemia, Hx CVA, Tachycardia Gastrointestinal Medical History: Negative for: Cirrhosis ; Colitis; Crohns; Hepatitis [name]; Hepatitis B; Hepatitis C Endocrine Medical History: Positive for: Type II Diabetes Negative for: Type I Diabetes Time with diabetes: over 20 years Treated with: Insulin, Oral agents Blood sugar tested every day: Yes Tested :  daily Genitourinary Medical History: Negative for: End Stage Renal Disease Past Medical History Notes: CKD stage 3 Immunological Fikes, Gale [name] ([id number]) [id number] Page 10 of 11 Medical History: Negative for: Lupus Erythematosus; Raynauds; Scleroderma Integumentary (Skin) Medical History: Negative for: History of Burn Musculoskeletal Medical History: Positive for: Osteoarthritis; Osteomyelitis - Hx in 2007 (left 3rd toe) Negative for: Gout; Rheumatoid Arthritis Past Medical History Notes: left leg weakness Neurologic Medical History: Positive for: Neuropathy Negative for: Dementia; Quadriplegia; Paraplegia; Seizure Disorder Past Medical History Notes: CVA Oncologic Medical History: Positive for: Received Radiation - 03/2020 Negative for: Received Chemotherapy Past Medical History Notes: hx endometrial cancer, left breast CA Psychiatric Medical History: Positive for: Confinement Anxiety Negative for: Anorexia/bulimia Past Medical History Notes: depression HBO Extended History Items Eyes: Cataracts Immunizations Pneumococcal Vaccine: Received  Pneumococcal Vaccination: Yes Received Pneumococcal Vaccination On or After 60th Birthday: Yes Implantable Devices None Hospitalization / Surgery History Type of Hospitalization/Surgery Amputation of right great toe Tendon repair Hysterectomy Osteomyelitis left 3rd toe cervical fusion with cage left breast lumpectomy Family and Social History Cancer: Yes - Father; Diabetes: No; Heart Disease: No; Hereditary Spherocytosis: No; Hypertension: Yes - Mother,Father,Siblings; Kidney Disease: No; Lung Disease: No; Seizures: No; Stroke: Yes - Mother,Father; Thyroid Problems: No; Tuberculosis: No; Never smoker; Marital Status - Single; Alcohol Use: Never; Drug Use: No History; Caffeine Use: Rarely; Financial Concerns: No; Food, Clothing or Shelter Needs: No; Support System Lacking: No; Transportation  Concerns: No Electronic Signature(s) Signed: 04/12/2022 4:21:39 PM By: Holly Shan DO Entered By: Holly [name] on 04/12/2022 15:53:16 Butzer, Holly [name] ([id number]) [id number] Page 11 of 11 -------------------------------------------------------------------------------- SuperBill Details Patient Name: Date of Service: Holly [name] 04/12/2022 Medical Record Number: [id number] Patient Account Number: [id number] Date of Birth/Sex: Treating RN: [name] (72 y.o. F) Primary Care Provider: Harlan [name] Other Clinician: Referring Provider: Treating Provider/Extender: Holly [name] in Treatment: 24 Diagnosis Coding ICD-10 Codes Code Description [phone] Non-pressure chronic ulcer of other part of left foot with fat layer exposed E11.621 Type 2 diabetes mellitus with foot ulcer E11.42 Type 2 diabetes mellitus with diabetic polyneuropathy Facility Procedures : CPT4 Code: [id number] Description: 50932 - DEB SUBQ TISSUE 20 SQ CM/< ICD-10 Diagnosis Description L97.522 Non-pressure chronic ulcer of other part of left foot with fat layer exposed E11.621 Type 2 diabetes mellitus with foot ulcer Modifier: Quantity: 1 Physician Procedures : CPT4 Code Description Modifier [id number] - WC PHYS SUBQ TISS 20 SQ CM ICD-10 Diagnosis Description L97.522 Non-pressure chronic ulcer of other part of left foot with fat layer exposed E11.621 Type 2 diabetes mellitus with foot ulcer Quantity: 1 Electronic Signature(s) Signed: 04/12/2022 4:21:39 PM By: Holly Shan DO Entered By: Holly [name] on 04/12/2022 15:54:58

## 2022-04-13 NOTE — Progress Notes (Signed)
[name], [name] ([id number]) [id number] Page 1 of 11 Visit Report for 04/07/2022 Chief Complaint Document Details Patient Name: Date of Service: [name]. 04/07/2022 12:30 PM Medical Record Number: [id number] Patient Account Number: [id number] Date of Birth/Sex: Treating RN: 08-05-1950 (72 y.o. F) Primary Care Provider: Harlan [name] Other Clinician: Referring Provider: Treating Provider/Extender: [name] in Treatment: 24 Information Obtained from: Patient Chief Complaint Left plantar ulcer 11/12/2019; patient is here referred by podiatry for review of [name] wound on the left first plantar metatarsal head 03/30/2021; right second toe wound s/p Trauma 10/21/2021; First left metatarsal head wound Electronic Signature(s) Signed: 04/07/2022 3:08:43 PM By: Holly Shan DO Entered By: [name] on 04/07/2022 13:23:35 -------------------------------------------------------------------------------- Debridement Details Patient Name: Date of Service: [name], [name]. 04/07/2022 12:30 PM Medical Record Number: [id number] Patient Account Number: [id number] Date of Birth/Sex: Treating RN: June 05, 1950 (72 y.o. [name], [name] Primary Care Provider: Harlan [name] Other Clinician: Referring Provider: Treating Provider/Extender: [name] [name] in Treatment: 24 Debridement Performed for Assessment: Wound #4 Left,Plantar Metatarsal head first Performed By: Physician Holly Shan, DO Debridement Type: Debridement Severity of Tissue Pre Debridement: Fat layer exposed Level of Consciousness (Pre-procedure): Awake and Alert Pre-procedure Verification/Time Out Yes - 13:14 Taken: Start Time: 13:14 Pain Control: Lidocaine T Area Debrided (L x W): otal 0.3 (cm) x 0.2 (cm) = 0.06 (cm) Tissue and other material debrided: Viable, Non-Viable, Callus, Slough, Subcutaneous, Slough Level:  Skin/Subcutaneous Tissue Debridement Description: Excisional Instrument: Curette Bleeding: Minimum Hemostasis Achieved: Pressure End Time: 13:14 Procedural Pain: 0 Post Procedural Pain: 0 Response to Treatment: Procedure was tolerated well Level of Consciousness (Post- Awake and Alert procedure): Post Debridement Measurements of Total Wound Length: (cm) 0.3 Width: (cm) 0.2 Depth: (cm) 0.3 Volume: (cm) 0.014 [name], [name] ([id number]) [id number] Page 2 of 11 Character of Wound/Ulcer Post Debridement: Improved Severity of Tissue Post Debridement: Fat layer exposed Post Procedure Diagnosis Same as Pre-procedure Electronic Signature(s) Signed: 04/07/2022 3:08:43 PM By: Holly Shan DO Signed: 04/13/2022 4:09:44 PM By: Holly Hammock RN Entered By: [name] on 04/07/2022 13:15:07 -------------------------------------------------------------------------------- HPI Details Patient Name: Date of Service: [name], [name]. 04/07/2022 12:30 PM Medical Record Number: [id number] Patient Account Number: [id number] Date of Birth/Sex: Treating RN: 1950-04-23 (72 y.o. F) Primary Care Provider: Harlan [name] Other Clinician: Referring Provider: Treating Provider/Extender: [name] in Treatment: 24 History of Present Illness HPI Description: 05/10/17 patient presents today for initial evaluation and our clinic concerning issues that she has been having with an ulceration on the plantar aspect of the left first metatarsal head. She has been seen [name] for this since August 2018. Subsequently he has recommended bunion surgery as that seems to be the culprit for why there is pressure and friction occurring at the site causing the callous and subsequently the wound. 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 With that being said I do have notes from several of his visits one of which does include an x-ray documentation stating that she had [name] negative x-ray. There was obviously the bunion the notes I have extended from 11 deformity. With that being said she has not had an MRI fortunately this wound does not probe to bone. The notes I have extended from February 08, 2017 through April 14, 2017. During that course she was also placed on antibiotics to help with an infection two [name] ago and this was doxycycline. Fortunately that seems to have completely resolved any infection issues that she may have had. Unfortunately this is an ulcer that she has actually been dealing with since April 2018. She just initially was trying to treat it and care for it on her own. Her most recent hemoglobin A1c was 5.8, she is [name] non-smoker, she does have [name] right first toe amputation and [name] left third toe amputation. Fortunately she's not having any discomfort at the site. 05/17/17 on evaluation today patient's ulcer on the plantar aspect of the first metatarsal region appears to be close at this point. There does not appear to be any evidence of ulcer opening. This is on initial inspection. She has not really had any discomfort but honestly she really was not experiencing [name] lot of pain even before. There is no evidence of infection or fluctuation under the callous which is present at this point. ADMISSION 11/12/2019 This is [name] 72 year old woman that we previously had in this clinic in 2017 for two visits with [name] wound I think roughly in the same area. At that point she had [name] bunion in the left. She has apparently had surgery on this since then. Looking through care everywhere we can see that she has had follow-up with podiatry from 06/12/2019 through 10/28/2019 for [name] wound on the plantar aspect of her left first MTP. She states when this started she was having severe knee pain on the right  which forced her to alter her gait. She has recently received doxycycline which she is finished and Bactroban. She is back to using Medihoney on the wound. She is offloading with the Pegasys shoe. Past medical history includes type 2 diabetes with [name] recent hemoglobin A1c of 6.8, severe type II diabetic neuropathy, history of [name] CVA, stage III chronic renal failure hypertension, depression, of bilateral previous bunion surgery, right first toe amputation and [name] partial left third toe amputation for osteomyelitis. The patient states that she recently had work to do in her mother's house in [place] and so she was on her feet quite [name] lot which delayed the healing. ABI in our clinic was 1.1 on the left 8/31; plantar aspect of her left first metatarsal head. Using silver collagen. This is probably [name] surgical wound 9/14; this is [name] patient who had [name] wound on the plantar aspect of her left first metatarsal head in the setting of [name] significant bunion deformity. We use cervical collagen and [name] offloading shoe. She has significant diabetic neuropathy probably some degree of [name] Charcot deformity in her feet. This is closed over today. Readmission 03/30/2021 [name] is [name] 72 year old female with [name] past medical history of insulin-dependent controlled type 2 diabetes s/p right great toe amputation, right knee osteoarthritis and hypertension that presents to the clinic for [name] second right toe wound. She states she was getting out of the bathtub when her foot got caught the tub and the skin to her second toe was split open. She noticed this after the bath when she saw blood on the floor. She has neuropathy in her feet. She visited the ED following the event on 03/26/2021. They obtained x-rays that showed [name] fracture to the fifth toe but no fracture to the second toe. She was started on doxycycline. She currently denies signs of infection. She has been keeping the area covered with [name] Band-Aid. Readmission  10/21/2021 [name] is [name] 72 year old female with [name] past medical history of insulin dependent controlled type 2 diabetes complicated by peripheral neuropathy that presents to the clinic for [name] left met head wound. She has been following with Dr. Earleen Newport, podiatry for over [name] year for this issue. She has tried collagen, mupirocin ointment and Medihoney. She is at high fall risk and cannot tolerate [name] total contact cast. She has orthotics with specialized inserts for her current issue. She currently denies signs of infection. 8/10; patient presents for follow-up. Unfortunately Regranex was unaffordable at $1200. She has been using Medihoney to the wound bed. She reports aggressively offloading the area. She has offloading inserts. She denies signs of infection. 8/22; patient presents for follow-up. She has been using Medihoney to the wound bed with improvement in wound healing. She has no issues or complaints today. She will be out of town starting next week for the next 2 [name]. NAIOMI, MUSTO [name] ([id number]) [id number] Page 3 of 11 9/11; patient presents for follow-up. She has been using Medihoney to the wound bed. She went on vacation and was last seen 3 [name] ago. She states she had tried to aggressively offload the wound bed. 9/26; patient presents for follow-up. She has been using Medihoney to the wound bed. She has no issues or complaints today. 10/13; patient presents for follow-up. She has been using Medihoney to the wound bed. She has no issues or complaints today. We rediscussed the possibility of [name] total contact cast but she does not feel comfortable as she is [name] high fall risk and lives alone. 10/26; patient presents for follow-up. Has been using Medihoney to the wound bed. She has been approved for Grafix however has [name] 20% co-pay. Patient declined proceeding with this due to financial cost. Patient has no issues or complaints today. 11/16; patient presents for  follow-up. She has been using Medihoney to the wound bed. She has been using her diabetic shoes with an offloading foam donut. She declines total contact cast placement today. 11/30; patient presents for follow-up. She has been using Medihoney to the wound bed. She did not receive silver alginate. She has been using the Pegasys with surgical shoe. There has been improvement in wound healing. 12/8; patient presents for follow-up. She has been using Medihoney and silver alginate to the wound bed. She has been using the Pegasys with surgical shoe without issues. 12/18; patient presents for follow-up. She has been using Medihoney and silver alginate to the wound bed. She continues to use her Pegasys with [name] surgical shoe. She has no issues or complaints today. 12/28; patient presents for follow-up. She has been using Medihoney and silver alginate to the wound bed. She continues to use her peg assist with [name] surgical shoe. She states she tried to aggressively offload the wound bed this week. She has no issues or complaints today. 1/18; patient missed her last clinic appointment due to conjunctivitis. She has been using Medihoney and silver alginate to the wound bed. She continues to use her peg assist with her surgical shoe. Electronic Signature(s) Signed: 04/07/2022 3:08:43 PM By: Holly Shan DO Entered By: [name] on 04/07/2022 13:24:23 -------------------------------------------------------------------------------- Physical Exam Details Patient Name: Date of Service: [name], [name]. 04/07/2022 12:30 PM Medical Record Number: [id number] Patient Account Number: [id number] Date of Birth/Sex: Treating RN: Dec 02, 1950 (72 y.o. F) Primary Care Provider: Harlan [name] Other Clinician: Referring Provider: Treating Provider/Extender: [name] [name] in Treatment: 24 Constitutional respirations regular,  non-labored and within target range for  patient.. Cardiovascular 2+ dorsalis pedis/posterior tibialis pulses. Psychiatric pleasant and cooperative. Notes Left foot: T the left first metatarsal there is an open wound with granulation tissue, nonviable tissue and callus. Postdebridement there is healthy granulation o tissue. No surrounding signs of infection. Electronic Signature(s) Signed: 04/07/2022 3:08:43 PM By: Holly Shan DO Entered By: [name] on 04/07/2022 13:25:11 -------------------------------------------------------------------------------- Physician Orders Details Patient Name: Date of Service: [name], [name]. 04/07/2022 12:30 PM Dicesare, Neoma Laming [name] ([id number]) [phone].pdf Page 4 of 11 Medical Record Number: [id number] Patient Account Number: [id number] Date of Birth/Sex: Treating RN: 20-Jul-1950 (72 y.o. [name] Primary Care Provider: Harlan [name] Other Clinician: Referring Provider: Treating Provider/Extender: [name] in Treatment: 24 Verbal / Phone Orders: No Diagnosis Coding Follow-up Appointments ppointment in 1 week. - w/ Dr. Heber Bardwell Tuesday 04/12/22 @ 3:30 Rm # 9 w/ Allayne Butcher Return [name] ppointment in 2 [name]. - w/ Dr. Heber [place] (go ahead and make an appt.) Return [name] Other: - Prism=Supplies Anesthetic (In clinic) Topical Lidocaine 5% applied to wound bed (In clinic) Topical Lidocaine 4% applied to wound bed Cellular or Tissue Based Products Cellular or Tissue Based Product Type: - IVR for Grafix IVR for [employer]=$225 Copay Bathing/ Shower/ Hygiene May shower and wash wound with soap and water. Edema Control - Lymphedema / SCD / Other Avoid standing for long periods of time. Off-Loading Other: - Diabetic Shoes Relieve pressure/time standing of feet ***Use surgical shoe with peg assist insert with cutout for wound for offloading*** Additional Orders / Instructions Follow Nutritious Diet - Monitor/Control Blood  Sugars Non Wound Condition Other Non Wound Condition Orders/Instructions: - Apply corn pad or gauze between great toe and 2nd toe. Wound Treatment Wound #4 - Metatarsal head first Wound Laterality: Plantar, Left Cleanser: Soap and Water 1 x Per Day/30 Days Discharge Instructions: May shower and wash wound with dial antibacterial soap and water prior to dressing change. Cleanser: Wound Cleanser (Generic) 1 x Per Day/30 Days Discharge Instructions: Cleanse the wound with wound cleanser prior to applying [name] clean dressing using gauze sponges, not tissue or cotton balls. Peri-Wound Care: Skin Prep (Generic) 1 x Per Day/30 Days Discharge Instructions: Use skin prep as directed Topical: BlastX 1 x Per Day/30 Days Prim Dressing: Hydrofera Blue Ready Transfer Foam, 2.5x2.5 (in/in) 1 x Per Day/30 Days ary Discharge Instructions: Apply directly to wound bed as directed Secondary Dressing: ALLEVYN Gentle Border, 3x3 (in/in) (Generic) 1 x Per Day/30 Days Discharge Instructions: Apply over primary dressing as directed. Secondary Dressing: Optifoam Non-Adhesive Dressing, 4x4 in 1 x Per Day/30 Days Discharge Instructions: Apply foam donut over primary dressing as directed. Electronic Signature(s) Signed: 04/07/2022 3:08:43 PM By: Holly Shan DO Entered By: [name] on 04/07/2022 13:25:20 Problem List Details -------------------------------------------------------------------------------- Jule Ser ([id number]) [id number] Page 5 of 11 Patient Name: Date of Service: [name]. 04/07/2022 12:30 PM Medical Record Number: [id number] Patient Account Number: [id number] Date of Birth/Sex: Treating RN: May 11, 1950 (72 y.o. F) Primary Care Provider: Harlan [name] Other Clinician: Referring Provider: Treating Provider/Extender: [name] in Treatment: 24 Active Problems ICD-10 Encounter Code Description Active Date  MDM Diagnosis L97.522 Non-pressure chronic ulcer of other part of left foot with fat layer exposed 10/21/2021 No Yes E11.621 Type 2 diabetes mellitus with foot ulcer 10/21/2021 No Yes E11.42 Type 2 diabetes mellitus with diabetic polyneuropathy 10/21/2021 No Yes Inactive Problems Resolved Problems Electronic Signature(s) Signed: 04/07/2022 3:08:43 PM By:  Holly Shan DO Entered By: [name] on 04/07/2022 13:23:05 -------------------------------------------------------------------------------- Progress Note Details Patient Name: Date of Service: [name]. 04/07/2022 12:30 PM Medical Record Number: [id number] Patient Account Number: [id number] Date of Birth/Sex: Treating RN: 08-Dec-1950 (72 y.o. F) Primary Care Provider: Harlan [name] Other Clinician: Referring Provider: Treating Provider/Extender: [name] in Treatment: 24 Subjective Chief Complaint Information obtained from Patient Left plantar ulcer 11/12/2019; patient is here referred by podiatry for review of [name] wound on the left first plantar metatarsal head 03/30/2021; right second toe wound s/p Trauma 10/21/2021; First left metatarsal head wound History of Present Illness (HPI) 05/10/17 patient presents today for initial evaluation and our clinic concerning issues that she has been having with an ulceration on the plantar aspect of the left first metatarsal head. She has been seen [name] for this since August 2018. Subsequently he has recommended bunion surgery as that seems to be the culprit for why there is pressure and friction occurring at the site causing the callous and subsequently the wound. 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 With that being said I do have notes from several of his visits one of which does include an x-ray  documentation stating that she had [name] negative x-ray. There was obviously the bunion the notes I have extended from 11 deformity. With that being said she has not had an MRI fortunately this wound does not probe to bone. The notes I have extended from February 08, 2017 through April 14, 2017. During that course she was also placed on antibiotics to help with an infection two [name] ago and this was doxycycline. Fortunately that seems to have completely resolved any infection issues that she may have had. Unfortunately this is an ulcer that she has actually been dealing with since April 2018. She just initially was trying to treat it and care for it on her own. Her most recent hemoglobin A1c was 5.8, she is [name] non-smoker, she does have [name] right first toe amputation and [name] left third toe amputation. Fortunately she's not having any discomfort at the site. 05/17/17 on evaluation today patient's ulcer on the plantar aspect of the first metatarsal region appears to be close at this point. There does not appear to be any evidence of ulcer opening. This is on initial inspection. She has not really had any discomfort but honestly she really was not experiencing [name] lot of pain even before. There is no evidence of infection or fluctuation under the callous which is present at this point. ADMISSION 11/12/2019 BERNARDINE, LANGWORTHY [name] ([id number]) [phone].pdf Page 6 of 11 This is [name] 72 year old woman that we previously had in this clinic in 2017 for two visits with [name] wound I think roughly in the same area. At that point she had [name] bunion in the left. She has apparently had surgery on this since then. Looking through care everywhere we can see that she has had follow-up with podiatry from 06/12/2019 through 10/28/2019 for [name] wound on the plantar aspect of her left first MTP. She states when this started she was having severe knee pain on the right which forced her to alter her gait. She has recently  received doxycycline which she is finished and Bactroban. She is back to using Medihoney on the wound. She is offloading with the Pegasys shoe.  Past medical history includes type 2 diabetes with [name] recent hemoglobin A1c of 6.8, severe type II diabetic neuropathy, history of [name] CVA, stage III chronic renal failure hypertension, depression, of bilateral previous bunion surgery, right first toe amputation and [name] partial left third toe amputation for osteomyelitis. The patient states that she recently had work to do in her mother's house in [place] and so she was on her feet quite [name] lot which delayed the healing. ABI in our clinic was 1.1 on the left 8/31; plantar aspect of her left first metatarsal head. Using silver collagen. This is probably [name] surgical wound 9/14; this is [name] patient who had [name] wound on the plantar aspect of her left first metatarsal head in the setting of [name] significant bunion deformity. We use cervical collagen and [name] offloading shoe. She has significant diabetic neuropathy probably some degree of [name] Charcot deformity in her feet. This is closed over today. Readmission 03/30/2021 [name] is [name] 72 year old female with [name] past medical history of insulin-dependent controlled type 2 diabetes s/p right great toe amputation, right knee osteoarthritis and hypertension that presents to the clinic for [name] second right toe wound. She states she was getting out of the bathtub when her foot got caught the tub and the skin to her second toe was split open. She noticed this after the bath when she saw blood on the floor. She has neuropathy in her feet. She visited the ED following the event on 03/26/2021. They obtained x-rays that showed [name] fracture to the fifth toe but no fracture to the second toe. She was started on doxycycline. She currently denies signs of infection. She has been keeping the area covered with [name] Band-Aid. Readmission 10/21/2021 [name] is [name] 72 year old female with [name] past  medical history of insulin dependent controlled type 2 diabetes complicated by peripheral neuropathy that presents to the clinic for [name] left met head wound. She has been following with Dr. Earleen Newport, podiatry for over [name] year for this issue. She has tried collagen, mupirocin ointment and Medihoney. She is at high fall risk and cannot tolerate [name] total contact cast. She has orthotics with specialized inserts for her current issue. She currently denies signs of infection. 8/10; patient presents for follow-up. Unfortunately Regranex was unaffordable at $1200. She has been using Medihoney to the wound bed. She reports aggressively offloading the area. She has offloading inserts. She denies signs of infection. 8/22; patient presents for follow-up. She has been using Medihoney to the wound bed with improvement in wound healing. She has no issues or complaints today. She will be out of town starting next week for the next 2 [name]. 9/11; patient presents for follow-up. She has been using Medihoney to the wound bed. She went on vacation and was last seen 3 [name] ago. She states she had tried to aggressively offload the wound bed. 9/26; patient presents for follow-up. She has been using Medihoney to the wound bed. She has no issues or complaints today. 10/13; patient presents for follow-up. She has been using Medihoney to the wound bed. She has no issues or complaints today. We rediscussed the possibility of [name] total contact cast but she does not feel comfortable as she is [name] high fall risk and lives alone. 10/26; patient presents for follow-up. Has been using Medihoney to the wound bed. She has been approved for Grafix however has [name] 20% co-pay. Patient declined proceeding with this due to financial cost. Patient has no issues or  complaints today. 11/16; patient presents for follow-up. She has been using Medihoney to the wound bed. She has been using her diabetic shoes with an offloading foam donut. She declines total  contact cast placement today. 11/30; patient presents for follow-up. She has been using Medihoney to the wound bed. She did not receive silver alginate. She has been using the Pegasys with surgical shoe. There has been improvement in wound healing. 12/8; patient presents for follow-up. She has been using Medihoney and silver alginate to the wound bed. She has been using the Pegasys with surgical shoe without issues. 12/18; patient presents for follow-up. She has been using Medihoney and silver alginate to the wound bed. She continues to use her Pegasys with [name] surgical shoe. She has no issues or complaints today. 12/28; patient presents for follow-up. She has been using Medihoney and silver alginate to the wound bed. She continues to use her peg assist with [name] surgical shoe. She states she tried to aggressively offload the wound bed this week. She has no issues or complaints today. 1/18; patient missed her last clinic appointment due to conjunctivitis. She has been using Medihoney and silver alginate to the wound bed. She continues to use her peg assist with her surgical shoe. Patient History Information obtained from Patient. Family History Cancer - Father, Hypertension - Mother,Father,Siblings, Stroke - Mother,Father, No family history of Diabetes, Heart Disease, Hereditary Spherocytosis, Kidney Disease, Lung Disease, Seizures, Thyroid Problems, Tuberculosis. Social History Never smoker, Marital Status - Single, Alcohol Use - Never, Drug Use - No History, Caffeine Use - Rarely. Medical History Eyes Patient has history of Cataracts Denies history of Glaucoma, Optic Neuritis Ear/Nose/Mouth/Throat Denies history of Chronic sinus problems/congestion, Middle ear problems Hematologic/Lymphatic Denies history of Anemia, Hemophilia, Human Immunodeficiency Virus, Lymphedema, Sickle Cell Disease Respiratory Patient has history of Sleep Apnea Denies history of Aspiration, Asthma, Chronic Obstructive  Pulmonary Disease (COPD), Pneumothorax, Tuberculosis Cardiovascular Patient has history of Hypertension Denies history of Angina, Arrhythmia, Congestive Heart Failure, Coronary Artery Disease, Deep Vein Thrombosis, Hypotension, Myocardial Infarction, Peripheral Arterial Disease, Peripheral Venous Disease, Phlebitis, Vasculitis Gastrointestinal Denies history of Cirrhosis , Colitis, Crohnoos, Hepatitis [name], Hepatitis B, Hepatitis C Endocrine Rabine, Preslynn [name] ([id number]) [id number] Page 7 of 11 Patient has history of Type II Diabetes Denies history of Type I Diabetes Genitourinary Denies history of End Stage Renal Disease Immunological Denies history of Lupus Erythematosus, Raynaudoos, Scleroderma Integumentary (Skin) Denies history of History of Burn Musculoskeletal Patient has history of Osteoarthritis, Osteomyelitis - Hx in 2007 (left 3rd toe) Denies history of Gout, Rheumatoid Arthritis Neurologic Patient has history of Neuropathy Denies history of Dementia, Quadriplegia, Paraplegia, Seizure Disorder Oncologic Patient has history of Received Radiation - 03/2020 Denies history of Received Chemotherapy Psychiatric Patient has history of Confinement Anxiety Denies history of Anorexia/bulimia Hospitalization/Surgery History - Amputation of right great toe. - Tendon repair. - Hysterectomy. - Osteomyelitis left 3rd toe. - cervical fusion with cage. - left breast lumpectomy. Medical [name] Surgical History Notes nd Constitutional Symptoms (General Health) morbid obesity Cardiovascular Takotsubo cardiomyopathy 2012, hyperlipidemia, Hx CVA, Tachycardia Genitourinary CKD stage 3 Musculoskeletal left leg weakness Neurologic CVA Oncologic hx endometrial cancer, left breast CA Psychiatric depression Objective Constitutional respirations regular, non-labored and within target range for patient.. Vitals Time Taken: 12:46 PM, Temperature: 98..7 F, Pulse:  74 bpm, Respiratory Rate: 17 breaths/min, Blood Pressure: 125/79 mmHg. Cardiovascular 2+ dorsalis pedis/posterior tibialis pulses. Psychiatric pleasant and cooperative. General Notes: Left foot: T the left first metatarsal there is an open wound with granulation  tissue, nonviable tissue and callus. Postdebridement there is o healthy granulation tissue. No surrounding signs of infection. Integumentary (Hair, Skin) Wound #4 status is Open. Original cause of wound was Gradually Appeared. The date acquired was: 10/02/2020. The wound has been in treatment 24 [name]. The wound is located on the Left,Plantar Metatarsal head first. The wound measures 0.3cm length x 0.2cm width x 0.3cm depth; 0.047cm^2 area and 0.014cm^3 volume. There is Fat Layer (Subcutaneous Tissue) exposed. There is no tunneling noted, however, there is undermining starting at 12:00 and ending at 12:00 with [name] maximum distance of 0.3cm. There is [name] medium amount of serosanguineous drainage noted. The wound margin is distinct with the outline attached to the wound base. There is large (67-100%) pink, pale granulation within the wound bed. There is no necrotic tissue within the wound bed. The periwound skin appearance exhibited: Callus. The periwound skin appearance did not exhibit: Crepitus, Excoriation, Induration, Rash, Scarring, Dry/Scaly, Maceration, Atrophie Blanche, Cyanosis, Ecchymosis, Hemosiderin Staining, Mottled, Pallor, Rubor, Erythema. Periwound temperature was noted as No Abnormality. Assessment Active Problems ICD-10 Non-pressure chronic ulcer of other part of left foot with fat layer exposed Type 2 diabetes mellitus with foot ulcer Type 2 diabetes mellitus with diabetic polyneuropathy Krahn, [place] [name] ([id number]) [id number] Page 8 of 11 Patient's wound is stable. I debrided nonviable tissue and callus. Unfortunately do not think she can offload this area very well. She declines [name] total  contact cast. At this time I recommended switching the dressing to Hydrofera Blue and blast X. Continue with surgical shoe and peg assist. Follow-up in 1 week. Procedures Wound #4 Pre-procedure diagnosis of Wound #4 is [name] Diabetic Wound/Ulcer of the Lower Extremity located on the Left,Plantar Metatarsal head first .Severity of Tissue Pre Debridement is: Fat layer exposed. There was [name] Excisional Skin/Subcutaneous Tissue Debridement with [name] total area of 0.06 sq cm performed by Holly Shan, DO. With the following instrument(s): Curette to remove Viable and Non-Viable tissue/material. Material removed includes Callus, Subcutaneous Tissue, and Slough after achieving pain control using Lidocaine. No specimens were taken. [name] time out was conducted at 13:14, prior to the start of the procedure. [name] Minimum amount of bleeding was controlled with Pressure. The procedure was tolerated well with [name] pain level of 0 throughout and [name] pain level of 0 following the procedure. Post Debridement Measurements: 0.3cm length x 0.2cm width x 0.3cm depth; 0.014cm^3 volume. Character of Wound/Ulcer Post Debridement is improved. Severity of Tissue Post Debridement is: Fat layer exposed. Post procedure Diagnosis Wound #4: Same as Pre-Procedure Plan Follow-up Appointments: Return Appointment in 1 week. - w/ Dr. Heber [place] Tuesday 04/12/22 @ 3:30 Rm # 9 w/ Allayne Butcher Return Appointment in 2 [name]. - w/ Dr. Heber [place] (go ahead and make an appt.) Other: - Prism=Supplies Anesthetic: (In clinic) Topical Lidocaine 5% applied to wound bed (In clinic) Topical Lidocaine 4% applied to wound bed Cellular or Tissue Based Products: Cellular or Tissue Based Product Type: - IVR for Grafix IVR for [employer]=$225 Copay Bathing/ Shower/ Hygiene: May shower and wash wound with soap and water. Edema Control - Lymphedema / SCD / Other: Avoid standing for long periods of time. Off-Loading: Other: - Diabetic Shoes Relieve pressure/time standing  of feet ***Use surgical shoe with peg assist insert with cutout for wound for offloading*** Additional Orders / Instructions: Follow Nutritious Diet - Monitor/Control Blood Sugars Non Wound Condition: Other Non Wound Condition Orders/Instructions: - Apply corn pad or gauze between great toe and 2nd toe. WOUND #4: - Metatarsal head first Wound  Laterality: Plantar, Left Cleanser: Soap and Water 1 x Per Day/30 Days Discharge Instructions: May shower and wash wound with dial antibacterial soap and water prior to dressing change. Cleanser: Wound Cleanser (Generic) 1 x Per Day/30 Days Discharge Instructions: Cleanse the wound with wound cleanser prior to applying [name] clean dressing using gauze sponges, not tissue or cotton balls. Peri-Wound Care: Skin Prep (Generic) 1 x Per Day/30 Days Discharge Instructions: Use skin prep as directed Topical: BlastX 1 x Per Day/30 Days Prim Dressing: Hydrofera Blue Ready Transfer Foam, 2.5x2.5 (in/in) 1 x Per Day/30 Days ary Discharge Instructions: Apply directly to wound bed as directed Secondary Dressing: ALLEVYN Gentle Border, 3x3 (in/in) (Generic) 1 x Per Day/30 Days Discharge Instructions: Apply over primary dressing as directed. Secondary Dressing: Optifoam Non-Adhesive Dressing, 4x4 in 1 x Per Day/30 Days Discharge Instructions: Apply foam donut over primary dressing as directed. 1. In office sharp debridement 2. Hydrofera Blue and blast X 3. Aggressive offloadingoopeg assist with surgical shoe 4. Follow-up in 1 week Electronic Signature(s) Signed: 04/07/2022 3:08:43 PM By: Holly Shan DO Entered By: [name] on 04/07/2022 13:26:58 -------------------------------------------------------------------------------- HxROS Details Patient Name: Date of Service: [name], [name]. 04/07/2022 12:30 PM Medical Record Number: [id number] Patient Account Number: [id number] [name], [name] ([id number]) [id number] Page 9 of  11 Date of Birth/Sex: Treating RN: [birth year] (72 y.o. F) Primary Care Provider: Other Clinician: Harlan [name] Referring Provider: Treating Provider/Extender: [name] in Treatment: 24 Information Obtained From Patient Constitutional Symptoms (General Health) Medical History: Past Medical History Notes: morbid obesity Eyes Medical History: Positive for: Cataracts Negative for: Glaucoma; Optic Neuritis Ear/Nose/Mouth/Throat Medical History: Negative for: Chronic sinus problems/congestion; Middle ear problems Hematologic/Lymphatic Medical History: Negative for: Anemia; Hemophilia; Human Immunodeficiency Virus; Lymphedema; Sickle Cell Disease Respiratory Medical History: Positive for: Sleep Apnea Negative for: Aspiration; Asthma; Chronic Obstructive Pulmonary Disease (COPD); Pneumothorax; Tuberculosis Cardiovascular Medical History: Positive for: Hypertension Negative for: Angina; Arrhythmia; Congestive Heart Failure; Coronary Artery Disease; Deep Vein Thrombosis; Hypotension; Myocardial Infarction; Peripheral Arterial Disease; Peripheral Venous Disease; Phlebitis; Vasculitis Past Medical History Notes: Takotsubo cardiomyopathy 2012, hyperlipidemia, Hx CVA, Tachycardia Gastrointestinal Medical History: Negative for: Cirrhosis ; Colitis; Crohns; Hepatitis [name]; Hepatitis B; Hepatitis C Endocrine Medical History: Positive for: Type II Diabetes Negative for: Type I Diabetes Time with diabetes: over 20 years Treated with: Insulin, Oral agents Blood sugar tested every day: Yes Tested : daily Genitourinary Medical History: Negative for: End Stage Renal Disease Past Medical History Notes: CKD stage 3 Immunological Medical History: Negative for: Lupus Erythematosus; Raynauds; Scleroderma Integumentary (Skin) Medical History: Negative for: History of Burn Musculoskeletal Medical HistoryMARQUESA, [name] ([id number])  [id number] Page 10 of 11 Positive for: Osteoarthritis; Osteomyelitis - Hx in 2007 (left 3rd toe) Negative for: Gout; Rheumatoid Arthritis Past Medical History Notes: left leg weakness Neurologic Medical History: Positive for: Neuropathy Negative for: Dementia; Quadriplegia; Paraplegia; Seizure Disorder Past Medical History Notes: CVA Oncologic Medical History: Positive for: Received Radiation - 03/2020 Negative for: Received Chemotherapy Past Medical History Notes: hx endometrial cancer, left breast CA Psychiatric Medical History: Positive for: Confinement Anxiety Negative for: Anorexia/bulimia Past Medical History Notes: depression HBO Extended History Items Eyes: Cataracts Immunizations Pneumococcal Vaccine: Received Pneumococcal Vaccination: Yes Received Pneumococcal Vaccination On or After 60th Birthday: Yes Implantable Devices None Hospitalization / Surgery History Type of Hospitalization/Surgery Amputation of right great toe Tendon repair Hysterectomy Osteomyelitis left 3rd toe cervical fusion with cage left breast lumpectomy Family and Social History Cancer: Yes - Father; Diabetes: No;  Heart Disease: No; Hereditary Spherocytosis: No; Hypertension: Yes - Mother,Father,Siblings; Kidney Disease: No; Lung Disease: No; Seizures: No; Stroke: Yes - Mother,Father; Thyroid Problems: No; Tuberculosis: No; Never smoker; Marital Status - Single; Alcohol Use: Never; Drug Use: No History; Caffeine Use: Rarely; Financial Concerns: No; Food, Clothing or Shelter Needs: No; Support System Lacking: No; Transportation Concerns: No Electronic Signature(s) Signed: 04/07/2022 3:08:43 PM By: Holly Shan DO Entered By: [name] on 04/07/2022 13:24:53 -------------------------------------------------------------------------------- SuperBill Details Patient Name: Date of Service: [name], [name]. 04/07/2022 Medical Record Number:  [id number] Patient Account Number: [id number] Date of Birth/Sex: Treating RN: [birth year] (72 y.o. [name], [name] Primary Care Provider: Harlan [name] Other Clinician: Referring Provider: Treating Provider/Extender: [name] in Treatment: [address], [place] [name] ([id number]) [id number] Page 11 of 11 Diagnosis Coding ICD-10 Codes Code Description [phone] Non-pressure chronic ulcer of other part of left foot with fat layer exposed E11.621 Type 2 diabetes mellitus with foot ulcer E11.42 Type 2 diabetes mellitus with diabetic polyneuropathy Facility Procedures : CPT4 Code: [id number] Description: 25956 - DEB SUBQ TISSUE 20 SQ CM/< ICD-10 Diagnosis Description L97.522 Non-pressure chronic ulcer of other part of left foot with fat layer exposed E11.621 Type 2 diabetes mellitus with foot ulcer Modifier: Quantity: 1 Physician Procedures : CPT4 Code Description Modifier [id number] - WC PHYS SUBQ TISS 20 SQ CM ICD-10 Diagnosis Description L97.522 Non-pressure chronic ulcer of other part of left foot with fat layer exposed E11.621 Type 2 diabetes mellitus with foot ulcer Quantity: 1 Electronic Signature(s) Signed: 04/07/2022 3:08:43 PM By: Holly Shan DO Entered By: [name] on 04/07/2022 13:27:08

## 2022-04-14 DIAGNOSIS — H43813 Vitreous degeneration, bilateral: Secondary | ICD-10-CM | POA: Diagnosis not present

## 2022-04-14 DIAGNOSIS — H35039 Hypertensive retinopathy, unspecified eye: Secondary | ICD-10-CM | POA: Diagnosis not present

## 2022-04-14 DIAGNOSIS — H34831 Tributary (branch) retinal vein occlusion, right eye, with macular edema: Secondary | ICD-10-CM | POA: Diagnosis not present

## 2022-04-18 DIAGNOSIS — L97522 Non-pressure chronic ulcer of other part of left foot with fat layer exposed: Secondary | ICD-10-CM | POA: Diagnosis not present

## 2022-04-18 DIAGNOSIS — E1165 Type 2 diabetes mellitus with hyperglycemia: Secondary | ICD-10-CM | POA: Diagnosis not present

## 2022-04-18 DIAGNOSIS — E1142 Type 2 diabetes mellitus with diabetic polyneuropathy: Secondary | ICD-10-CM | POA: Diagnosis not present

## 2022-04-18 DIAGNOSIS — Z794 Long term (current) use of insulin: Secondary | ICD-10-CM | POA: Diagnosis not present

## 2022-04-18 DIAGNOSIS — Z89411 Acquired absence of right great toe: Secondary | ICD-10-CM | POA: Diagnosis not present

## 2022-04-19 ENCOUNTER — Encounter (HOSPITAL_BASED_OUTPATIENT_CLINIC_OR_DEPARTMENT_OTHER): Payer: PPO | Admitting: Internal Medicine

## 2022-04-19 DIAGNOSIS — E11621 Type 2 diabetes mellitus with foot ulcer: Secondary | ICD-10-CM | POA: Diagnosis not present

## 2022-04-19 DIAGNOSIS — E1142 Type 2 diabetes mellitus with diabetic polyneuropathy: Secondary | ICD-10-CM | POA: Diagnosis not present

## 2022-04-19 DIAGNOSIS — L97522 Non-pressure chronic ulcer of other part of left foot with fat layer exposed: Secondary | ICD-10-CM

## 2022-04-20 NOTE — Progress Notes (Signed)
Holly, TEFFETELLER [name] ([id number]) [id number] Page 1 of 8 Visit Report for 04/19/2022 Arrival Information Details Patient Name: Date of Service: [name] 04/19/2022 3:15 PM Medical Record Number: [id number] Patient Account Number: [id number] Date of Birth/Sex: Treating RN: 1950/09/14 (72 y.o. F) Primary Care Jeffrey Graefe: [name] Other Clinician: Referring Kamorie Aldous: Treating Zyree Traynham/Extender: [name] in Treatment: 25 Visit Information History Since Last Visit Added or deleted any medications: No Patient Arrived: Ambulatory Any new allergies or adverse reactions: No Arrival Time: 15:06 Had [name] fall or experienced change in No Accompanied By: self activities of daily living that may affect Transfer Assistance: None risk of falls: Patient Identification Verified: Yes Signs or symptoms of abuse/neglect since last visito No Secondary Verification Process Completed: Yes Hospitalized since last visit: No Patient Requires Transmission-Based Precautions: No Implantable device outside of the clinic excluding No Patient Has Alerts: Yes cellular tissue based products placed in the center Patient Alerts: ABI's R=1.07 L=1.02 since last visit: Has Dressing in Place as Prescribed: Yes Pain Present Now: No Electronic Signature(s) Signed: 04/19/2022 3:52:19 PM By: Erenest Blank Entered By: Erenest Blank on 04/19/2022 15:08:12 -------------------------------------------------------------------------------- Encounter Discharge Information Details Patient Name: Date of Service: [name], Holly Dess RA H [name]. 04/19/2022 3:15 PM Medical Record Number: [id number] Patient Account Number: [id number] Date of Birth/Sex: Treating RN: 15-Nov-1950 (72 y.o. Tonita Phoenix, [name] Primary Care Sargon Scouten: [name] Other Clinician: Referring Sarah-Jane Nazario: Treating Rakeb Kibble/Extender: [name] in Treatment: 25 Encounter Discharge  Information Items Post Procedure Vitals Discharge Condition: Stable Temperature (F): 98.7 Ambulatory Status: Ambulatory Pulse (bpm): 74 Discharge Destination: Home Respiratory Rate (breaths/min): 17 Transportation: Private Auto Blood Pressure (mmHg): 120/80 Accompanied By: self Schedule Follow-up Appointment: Yes Clinical Summary of Care: Patient Declined Electronic Signature(s) Signed: 04/20/2022 8:39:24 AM By: Rhae Hammock RN Entered By: Rhae Hammock on 04/19/2022 15:34:23 Behrend, Parmelee [name] ([id number]) [id number] Page 2 of 8 -------------------------------------------------------------------------------- Lower Extremity Assessment Details Patient Name: Date of Service: Holly Alar [name]. 04/19/2022 3:15 PM Medical Record Number: [id number] Patient Account Number: [id number] Date of Birth/Sex: Treating RN: 11-23-1950 (72 y.o. F) Primary Care Jamayah Myszka: [name] Other Clinician: Referring Karley Pho: Treating Aasiyah Auerbach/Extender: [name] Weeks in Treatment: 25 Edema Assessment Assessed: [Left: No] [Right: No] Edema: [Left: N] [Right: o] Calf Left: Right: Point of Measurement: 34 cm From Medial Instep 48.5 cm Ankle Left: Right: Point of Measurement: 9 cm From Medial Instep 29.2 cm Electronic Signature(s) Signed: 04/19/2022 3:52:19 PM By: Erenest Blank Entered By: Erenest Blank on 04/19/2022 15:13:54 -------------------------------------------------------------------------------- Multi Wound Chart Details Patient Name: Date of Service: [name], Holly Dess RA H [name]. 04/19/2022 3:15 PM Medical Record Number: [id number] Patient Account Number: [id number] Date of Birth/Sex: Treating RN: 06-06-1950 (72 y.o. F) Primary Care [name]: [name] Other Clinician: Referring Christopherjame Carnell: Treating Icelyn Navarrete/Extender: [name] in Treatment: 25 Vital Signs Height(in): Pulse(bpm):  74 Weight(lbs): Blood Pressure(mmHg): 160/83 Body Mass Index(BMI): Temperature(F): 97.7 Respiratory Rate(breaths/min): 18 [4:Photos:] [N/[name]:N/[name]] Left, Plantar Metatarsal head first N/[name] N/[name] Wound Location: Gradually Appeared N/[name] N/[name] Wounding Event: Diabetic Wound/Ulcer of the Lower N/[name] N/[name] Primary Etiology: Extremity Cataracts, Sleep Apnea, Hypertension, N/[name] N/[name] Comorbid History: Type II Diabetes, Osteoarthritis, [name], [name] ([id number]) [id number] Page 3 of 8 Osteomyelitis, Neuropathy, Received Radiation, Confinement Anxiety 10/02/2020 N/[name] N/[name] Date Acquired: 25 N/[name] N/[name] Weeks of Treatment: Open N/[name] N/[name] Wound Status: No N/[name] N/[name] Wound Recurrence: 0.5x0.5x0.3 N/[name] N/[name] Measurements L x W x D (cm) 0.196 N/[name] N/[name] [name] (cm) :  rea 0.059 N/[name] N/[name] Volume (cm) : 52.00% N/[name] N/[name] % Reduction in [name] rea: 52.00% N/[name] N/[name] % Reduction in Volume: Grade 1 N/[name] N/[name] Classification: Medium N/[name] N/[name] Exudate [name] mount: Serosanguineous N/[name] N/[name] Exudate Type: red, brown N/[name] N/[name] Exudate Color: Distinct, outline attached N/[name] N/[name] Wound Margin: Large (67-100%) N/[name] N/[name] Granulation [name] mount: Pink, Pale N/[name] N/[name] Granulation Quality: None Present (0%) N/[name] N/[name] Necrotic [name] mount: Fat Layer (Subcutaneous Tissue): Yes N/[name] N/[name] Exposed Structures: Fascia: No Tendon: No Muscle: No Joint: No Bone: No Large (67-100%) N/[name] N/[name] Epithelialization: Debridement - Excisional N/[name] N/[name] Debridement: Pre-procedure Verification/Time Out 15:23 N/[name] N/[name] Taken: Lidocaine N/[name] N/[name] Pain Control: Callus, Subcutaneous, Slough N/[name] N/[name] Tissue Debrided: Skin/Subcutaneous Tissue N/[name] N/[name] Level: 0.25 N/[name] N/[name] Debridement [name] (sq cm): rea Curette N/[name] N/[name] Instrument: Minimum N/[name] N/[name] Bleeding: Pressure N/[name] N/[name] Hemostasis [name] chieved: 0 N/[name] N/[name] Procedural Pain: 0 N/[name] N/[name] Post Procedural Pain: Procedure was tolerated well N/[name] N/[name] Debridement Treatment Response: 0.5x0.5x0.3 N/[name] N/[name] Post  Debridement Measurements L x W x D (cm) 0.059 N/[name] N/[name] Post Debridement Volume: (cm) Callus: Yes N/[name] N/[name] Periwound Skin Texture: Excoriation: No Induration: No Crepitus: No Rash: No Scarring: No Maceration: No N/[name] N/[name] Periwound Skin Moisture: Dry/Scaly: No Atrophie Blanche: No N/[name] N/[name] Periwound Skin Color: Cyanosis: No Ecchymosis: No Erythema: No Hemosiderin Staining: No Mottled: No Pallor: No Rubor: No No Abnormality N/[name] N/[name] Temperature: Debridement N/[name] N/[name] Procedures Performed: Treatment Notes Wound #4 (Metatarsal head first) Wound Laterality: Plantar, Left Cleanser Soap and Water Discharge Instruction: May shower and wash wound with dial antibacterial soap and water prior to dressing change. Wound Cleanser Discharge Instruction: Cleanse the wound with wound cleanser prior to applying [name] clean dressing using gauze sponges, not tissue or cotton balls. Peri-Wound Care Skin Prep Discharge Instruction: Use skin prep as directed Topical BlastX Primary Dressing Hydrofera Blue Ready Transfer Foam, 2.5x2.5 (in/in) Discharge Instruction: Apply directly to wound bed as directed Secondary Dressing KARA, MELCHING [name] ([id number]) [phone].pdf Page 4 of 8 ALLEVYN Gentle Border, 3x3 (in/in) Discharge Instruction: Apply over primary dressing as directed. Optifoam Non-Adhesive Dressing, 4x4 in Discharge Instruction: Apply foam donut over primary dressing as directed. Secured With Compression Wrap Compression Stockings Environmental education officer) Signed: 04/19/2022 3:38:55 PM By: [name] DO Entered By: [name] on 04/19/2022 15:35:45 -------------------------------------------------------------------------------- Multi-Disciplinary Care Plan Details Patient Name: Date of Service: [name], Holly Dess RA H [name]. 04/19/2022 3:15 PM Medical Record Number: [id number] Patient Account Number: [id number] Date of Birth/Sex: Treating RN: February 07, 1951 (71  y.o. Tonita Phoenix, [name] Primary Care Crysten Kaman: [name] Other Clinician: Referring Jax Abdelrahman: Treating Conrado Nance/Extender: [name] in Treatment: 25 Active Inactive Nutrition Nursing Diagnoses: Impaired glucose control: actual or potential Goals: Patient/caregiver verbalizes understanding of need to maintain therapeutic glucose control per primary care physician Date Initiated: 10/21/2021 Target Resolution Date: 04/16/2022 Goal Status: Active Interventions: Assess HgA1c results as ordered upon admission and as needed Provide education on elevated blood sugars and impact on wound healing Notes: Wound/Skin Impairment Nursing Diagnoses: Impaired tissue integrity Goals: Patient/caregiver will verbalize understanding of skin care regimen Date Initiated: 10/21/2021 Target Resolution Date: 04/23/2022 Goal Status: Active Ulcer/skin breakdown will have [name] volume reduction of 30% by week 4 Date Initiated: 10/21/2021 Date Inactivated: 11/29/2021 Target Resolution Date: 11/18/2021 Goal Status: Met Interventions: Assess patient/caregiver ability to obtain necessary supplies Assess patient/caregiver ability to perform ulcer/skin care regimen upon admission and as needed Assess ulceration(s) every visit Provide education on ulcer and skin care Treatment Activities: Topical  wound management initiated : 10/21/2021 PHELICIA, DANTES [name] ([id number]) [phone].pdf Page 5 of 8 Notes: 11/29/21: Wound care regimen continues. Running IVR for skin sub Electronic Signature(s) Signed: 04/20/2022 8:39:24 AM By: Rhae Hammock RN Entered By: Rhae Hammock on 04/19/2022 15:32:49 -------------------------------------------------------------------------------- Pain Assessment Details Patient Name: Date of Service: [name], Holly Dess RA H [name]. 04/19/2022 3:15 PM Medical Record Number: [id number] Patient Account Number: [id number] Date of Birth/Sex: Treating  RN: November 25, 1950 (72 y.o. F) Primary Care Cherrie Franca: [name] Other Clinician: Referring Tangy Drozdowski: Treating Caulin Begley/Extender: [name] in Treatment: 25 Active Problems Location of Pain Severity and Description of Pain Patient Has Paino No Site Locations Pain Management and Medication Current Pain Management: Electronic Signature(s) Signed: 04/19/2022 3:52:19 PM By: Erenest Blank Entered By: Erenest Blank on 04/19/2022 15:10:29 -------------------------------------------------------------------------------- Patient/Caregiver Education Details Patient Name: Date of Service: Holly Alar [name]. 1/30/2024andnbsp3:15 PM Medical Record Number: [id number] Patient Account Number: [id number] Date of Birth/Gender: Treating RN: 04-11-1950 (72 y.o. Tonita Phoenix, [name] Primary Care Physician: [name] Other Clinician: Referring Physician: Treating Physician/Extender: [name] in Treatment: [address] [place], [place] ([id number]) [id number] Page 6 of 8 Education Provided To: Patient Education Topics Provided Wound/Skin Impairment: Methods: Explain/Verbal Responses: Reinforcements needed, State content correctly Electronic Signature(s) Signed: 04/20/2022 8:39:24 AM By: Rhae Hammock RN Entered By: Rhae Hammock on 04/19/2022 15:33:03 -------------------------------------------------------------------------------- Wound Assessment Details Patient Name: Date of Service: [name], Holly Dess RA H [name]. 04/19/2022 3:15 PM Medical Record Number: [id number] Patient Account Number: [id number] Date of Birth/Sex: Treating RN: [birth year] (72 y.o. F) Primary Care Francesa Eugenio: [name] Other Clinician: Referring Judith Campillo: Treating Altus Zaino/Extender: [name] Weeks in Treatment: 25 Wound Status Wound Number: 4 Primary Diabetic Wound/Ulcer of the Lower  Extremity Etiology: Wound Location: Left, Plantar Metatarsal head first Wound Open Wounding Event: Gradually Appeared Status: Date Acquired: 10/02/2020 Comorbid Cataracts, Sleep Apnea, Hypertension, Type II Diabetes, Weeks Of Treatment: 25 History: Osteoarthritis, Osteomyelitis, Neuropathy, Received Radiation, Clustered Wound: No Confinement Anxiety Photos Wound Measurements Length: (cm) 0.5 Width: (cm) 0.5 Depth: (cm) 0.3 Area: (cm) 0.196 Volume: (cm) 0.059 % Reduction in Area: 52% % Reduction in Volume: 52% Epithelialization: Large (67-100%) Tunneling: No Undermining: No Wound Description Classification: Grade 1 Wound Margin: Distinct, outline attached Exudate Amount: Medium Exudate Type: Serosanguineous Exudate Color: red, brown Foul Odor After Cleansing: No Slough/Fibrino No Wound Bed Granulation Amount: Large (67-100%) Exposed Structure Granulation Quality: Pink, Pale Fascia Exposed: No Necrotic Amount: None Present (0%) Fat Layer (Subcutaneous Tissue) Exposed: Yes Tendon Exposed: No Bartelson, Yelm [name] ([id number]) [id number] Page 7 of 8 Muscle Exposed: No Joint Exposed: No Bone Exposed: No Periwound Skin Texture Texture Color No Abnormalities Noted: No No Abnormalities Noted: No Callus: Yes Atrophie Blanche: No Crepitus: No Cyanosis: No Excoriation: No Ecchymosis: No Induration: No Erythema: No Rash: No Hemosiderin Staining: No Scarring: No Mottled: No Pallor: No Moisture Rubor: No No Abnormalities Noted: No Dry / Scaly: No Temperature / Pain Maceration: No Temperature: No Abnormality Treatment Notes Wound #4 (Metatarsal head first) Wound Laterality: Plantar, Left Cleanser Soap and Water Discharge Instruction: May shower and wash wound with dial antibacterial soap and water prior to dressing change. Wound Cleanser Discharge Instruction: Cleanse the wound with wound cleanser prior to applying [name] clean dressing using  gauze sponges, not tissue or cotton balls. Peri-Wound Care Skin Prep Discharge Instruction: Use skin prep as directed Topical BlastX Primary Dressing Hydrofera Blue Ready Transfer Foam, 2.5x2.5 (in/in) Discharge Instruction: Apply directly to wound bed as  directed Secondary Dressing ALLEVYN Gentle Border, 3x3 (in/in) Discharge Instruction: Apply over primary dressing as directed. Optifoam Non-Adhesive Dressing, 4x4 in Discharge Instruction: Apply foam donut over primary dressing as directed. Secured With Compression Wrap Compression Stockings Environmental education officer) Signed: 04/19/2022 3:52:19 PM By: Erenest Blank Entered By: Erenest Blank on 04/19/2022 15:15:24 -------------------------------------------------------------------------------- Vitals Details Patient Name: Date of Service: [name], [name] 04/19/2022 3:15 PM Medical Record Number: [id number] Patient Account Number: [id number] Date of Birth/Sex: Treating RN: 01/30/1951 (72 y.o. F) Primary Care Ming Mcmannis: [name] Other Clinician: Referring Sylvia Helms: Treating Yovana Scogin/Extender: [name] Weeks in Treatment: 25 Vital Signs Adolph, Stanford ([id number]) [id number] Page 8 of 8 Time Taken: 15:08 Temperature (F): 97.7 Pulse (bpm): 76 Respiratory Rate (breaths/min): 18 Blood Pressure (mmHg): 160/83 Reference Range: 80 - 120 mg / dl Electronic Signature(s) Signed: 04/19/2022 3:52:19 PM By: Erenest Blank Entered By: Erenest Blank on 04/19/2022 15:10:19

## 2022-04-20 NOTE — Progress Notes (Signed)
Holly [name] [name] ([id number]) [id number] Page 1 of 11 Visit Report for 04/19/2022 Chief Complaint Document Details Patient Name: Date of Service: Holly [name] 04/19/2022 3:15 PM Medical Record Number: [id number] Patient Account Number: [id number] Date of Birth/Sex: Treating RN: 11-06-1950 (72 y.o. F) Primary Care Provider: [name] Other Clinician: Referring Provider: Treating Provider/Extender: [name] in Treatment: 25 Information Obtained from: Patient Chief Complaint Left plantar ulcer 11/12/2019; patient is here referred by podiatry for review of [name] wound on the left first plantar metatarsal head 03/30/2021; right second toe wound s/p Trauma 10/21/2021; First left metatarsal head wound Electronic Signature(s) Signed: 04/19/2022 3:38:55 PM By: [name] DO Entered By: [name] on 04/19/2022 15:35:52 -------------------------------------------------------------------------------- Debridement Details Patient Name: Date of Service: Holly [name], Holly RA H [name]. 04/19/2022 3:15 PM Medical Record Number: [id number] Patient Account Number: [id number] Date of Birth/Sex: Treating RN: 1950-09-14 (72 y.o. Holly [name], Holly [name] Primary Care Provider: [name] Other Clinician: Referring Provider: Treating Provider/Extender: [name] in Treatment: 25 Debridement Performed for Assessment: Wound #4 Left,Plantar Metatarsal head first Performed By: Physician [name], DO Debridement Type: Debridement Severity of Tissue Pre Debridement: Fat layer exposed Level of Consciousness (Pre-procedure): Awake and Alert Pre-procedure Verification/Time Out Yes - 15:23 Taken: Start Time: 15:23 Pain Control: Lidocaine T Area Debrided (L x W): otal 0.5 (cm) x 0.5 (cm) = 0.25 (cm) Tissue and other material debrided: Viable, Non-Viable, Callus, Slough, Subcutaneous, Slough Level:  Skin/Subcutaneous Tissue Debridement Description: Excisional Instrument: Curette Bleeding: Minimum Hemostasis Achieved: Pressure End Time: 15:23 Procedural Pain: 0 Post Procedural Pain: 0 Response to Treatment: Procedure was tolerated well Level of Consciousness (Post- Awake and Alert procedure): Post Debridement Measurements of Total Wound Length: (cm) 0.5 Width: (cm) 0.5 Depth: (cm) 0.3 Volume: (cm) 0.059 Holly [name] ([id number]) [id number] Page 2 of 11 Character of Wound/Ulcer Post Debridement: Improved Severity of Tissue Post Debridement: Fat layer exposed Post Procedure Diagnosis Same as Pre-procedure Electronic Signature(s) Signed: 04/19/2022 3:38:55 PM By: [name] DO Signed: 04/20/2022 8:39:24 AM By: Rhae Hammock RN Entered By: Rhae Hammock on 04/19/2022 15:25:16 -------------------------------------------------------------------------------- HPI Details Patient Name: Date of Service: Holly [name], Holly [name] RA H [name]. 04/19/2022 3:15 PM Medical Record Number: [id number] Patient Account Number: [id number] Date of Birth/Sex: Treating RN: 01-07-1951 (72 y.o. F) Primary Care Provider: [name] Other Clinician: Referring Provider: Treating Provider/Extender: [name] in Treatment: 25 History of Present Illness HPI Description: 05/10/17 patient presents today for initial evaluation and our clinic concerning issues that she has been having with an ulceration on the plantar aspect of the left first metatarsal head. She has been seen [name] for this since August 2018. Subsequently he has recommended bunion surgery as that seems to be the culprit for why there is pressure and friction occurring at the site causing the callous and subsequently the wound. 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 With that being said I do have notes from several of his visits one of which does include an x-ray documentation stating that she had [name] negative x-ray. There was obviously the bunion the notes I have extended from 11 deformity. With that being said she has not had an MRI fortunately this wound does not probe to bone. The notes I have extended from February 08, 2017 through April 14, 2017. During that course she was also placed on antibiotics to help with an infection two weeks ago and this was doxycycline. Fortunately that seems to have completely resolved any infection issues that she may have had. Unfortunately this is an ulcer that she has actually been dealing with since April 2018. She just initially was trying to treat it and care for it on her own. Her most recent hemoglobin A1c was 5.8, she is [name] non-smoker, she does have [name] right first toe amputation and [name] left third toe amputation. Fortunately she's not having any discomfort at the site. 05/17/17 on evaluation today patient's ulcer on the plantar aspect of the first metatarsal region appears to be close at this point. There does not appear to be any evidence of ulcer opening. This is on initial inspection. She has not really had any discomfort but honestly she really was not experiencing [name] lot of pain even before. There is no evidence of infection or fluctuation under the callous which is present at this point. ADMISSION 11/12/2019 This is [name] 72 year old woman that we previously had in this clinic in 2017 for two visits with [name] wound I think roughly in the same area. At that point she had [name] bunion in the left. She has apparently had surgery on this since then. Looking through care everywhere we can see that she has had follow-up with podiatry from 06/12/2019 through 10/28/2019 for [name] wound on the plantar aspect of her left first MTP. She states when this started she was having severe knee pain on the right  which forced her to alter her gait. She has recently received doxycycline which she is finished and Bactroban. She is back to using Medihoney on the wound. She is offloading with the Pegasys shoe. Past medical history includes type 2 diabetes with [name] recent hemoglobin A1c of 6.8, severe type II diabetic neuropathy, history of [name] CVA, stage III chronic renal failure hypertension, depression, of bilateral previous bunion surgery, right first toe amputation and [name] partial left third toe amputation for osteomyelitis. The patient states that she recently had work to do in her mother's house in [place] and so she was on her feet quite [name] lot which delayed the healing. ABI in our clinic was 1.1 on the left 8/31; plantar aspect of her left first metatarsal head. Using silver collagen. This is probably [name] surgical wound 9/14; this is [name] patient who had [name] wound on the plantar aspect of her left first metatarsal head in the setting of [name] significant bunion deformity. We use cervical collagen and [name] offloading shoe. She has significant diabetic neuropathy probably some degree of [name] Charcot deformity in her feet. This is closed over today. Readmission 03/30/2021 Ms. Claudine Stallings is [name] 72 year old female with [name] past medical history of insulin-dependent controlled type 2 diabetes s/p right great toe amputation, right knee osteoarthritis and hypertension that presents to the clinic for [name] second right toe wound. She states she was getting out of the bathtub when her foot got caught the tub and the skin to her second toe was split open. She noticed this after the bath when she saw blood on the floor. She has neuropathy in her feet. She visited the ED following the event on 03/26/2021. They obtained x-rays that showed [name] fracture to the fifth toe but no fracture to the second toe. She was started on doxycycline. She currently denies signs of infection. She has been keeping the area covered with [name] Band-Aid. Readmission  10/21/2021 Ms. Vesper Lerman is [name] 72 year old female with [name] past medical history of insulin dependent controlled type 2 diabetes complicated by peripheral neuropathy that presents to the clinic for [name] left met head wound. She has been following with Dr. Earleen Newport, podiatry for over [name] year for this issue. She has tried collagen, mupirocin ointment and Medihoney. She is at high fall risk and cannot tolerate [name] total contact cast. She has orthotics with specialized inserts for her current issue. She currently denies signs of infection. 8/10; patient presents for follow-up. Unfortunately Regranex was unaffordable at $1200. She has been using Medihoney to the wound bed. She reports aggressively offloading the area. She has offloading inserts. She denies signs of infection. 8/22; patient presents for follow-up. She has been using Medihoney to the wound bed with improvement in wound healing. She has no issues or complaints today. She will be out of town starting next week for the next 2 weeks. Holly [name], Holly [name] ([id number]) [id number] Page 3 of 11 9/11; patient presents for follow-up. She has been using Medihoney to the wound bed. She went on vacation and was last seen 3 weeks ago. She states she had tried to aggressively offload the wound bed. 9/26; patient presents for follow-up. She has been using Medihoney to the wound bed. She has no issues or complaints today. 10/13; patient presents for follow-up. She has been using Medihoney to the wound bed. She has no issues or complaints today. We rediscussed the possibility of [name] total contact cast but she does not feel comfortable as she is [name] high fall risk and lives alone. 10/26; patient presents for follow-up. Has been using Medihoney to the wound bed. She has been approved for Grafix however has [name] 20% co-pay. Patient declined proceeding with this due to financial cost. Patient has no issues or complaints today. 11/16; patient presents for  follow-up. She has been using Medihoney to the wound bed. She has been using her diabetic shoes with an offloading foam donut. She declines total contact cast placement today. 11/30; patient presents for follow-up. She has been using Medihoney to the wound bed. She did not receive silver alginate. She has been using the Pegasys with surgical shoe. There has been improvement in wound healing. 12/8; patient presents for follow-up. She has been using Medihoney and silver alginate to the wound bed. She has been using the Pegasys with surgical shoe without issues. 12/18; patient presents for follow-up. She has been using Medihoney and silver alginate to the wound bed. She continues to use her Pegasys with [name] surgical shoe. She has no issues or complaints today. 12/28; patient presents for follow-up. She has been using Medihoney and silver alginate to the wound bed. She continues to use her peg assist with [name] surgical shoe. She states she tried to aggressively offload the wound bed this week. She has no issues or complaints today. 1/18; patient missed her last clinic appointment due to conjunctivitis. She has been using Medihoney and silver alginate to the wound bed. She continues to use her peg assist with her surgical shoe. 1/23; patient presents for follow-up. She has been using blast X and Hydrofera Blue to the wound bed. She uses her peg assist with her surgical shoe. She has no issues or complaints today. 1/30; patient presents for follow-up. She has been using blast X and Hydrofera Blue to the wound bed. She continues to use her peg assist with her surgical shoe. She has no issues or complaints today. She has not received  her blast X but still has ointment and the samples that were given to her. Electronic Signature(s) Signed: 04/19/2022 3:38:55 PM By: [name] DO Entered By: [name] on 04/19/2022  15:36:40 -------------------------------------------------------------------------------- Physical Exam Details Patient Name: Date of Service: Holly Conroy RA H [name]. 04/19/2022 3:15 PM Medical Record Number: [id number] Patient Account Number: [id number] Date of Birth/Sex: Treating RN: 10-22-1950 (72 y.o. F) Primary Care Provider: [name] Other Clinician: Referring Provider: Treating Provider/Extender: [name] Weeks in Treatment: 25 Constitutional respirations regular, non-labored and within target range for patient.. Cardiovascular 2+ dorsalis pedis/posterior tibialis pulses. Psychiatric pleasant and cooperative. Notes Left foot: T the left first metatarsal there is an open wound with granulation tissue, nonviable tissue and callus. Postdebridement there is healthy granulation o tissue. No surrounding signs of infection. Electronic Signature(s) Signed: 04/19/2022 3:38:55 PM By: [name] DO Entered By: [name] on 04/19/2022 15:37:04 Knerr, Brunette [name] ([id number]) [id number] Page 4 of 11 -------------------------------------------------------------------------------- Physician Orders Details Patient Name: Date of Service: Holly Alar [name]. 04/19/2022 3:15 PM Medical Record Number: [id number] Patient Account Number: [id number] Date of Birth/Sex: Treating RN: 28-Jun-1950 (72 y.o. Benjaman Lobe Primary Care Provider: [name] Other Clinician: Referring Provider: Treating Provider/Extender: [name] in Treatment: 25 Verbal / Phone Orders: No Diagnosis Coding Follow-up Appointments ppointment in 1 week. - w/ Dr. Heber Hettick Tuesday 04/26/22 Rm # 7 Return [name] Other: - Prism=Supplies Anesthetic (In clinic) Topical Lidocaine 5% applied to wound bed (In clinic) Topical Lidocaine 4% applied to wound bed Cellular or Tissue Based Products Cellular or Tissue Based Product Type: -  IVR for Grafix IVR for [employer]=$225 Copay Bathing/ Shower/ Hygiene May shower and wash wound with soap and water. Edema Control - Lymphedema / SCD / Other Avoid standing for long periods of time. Off-Loading Other: - Diabetic Shoes Relieve pressure/time standing of feet ***Use surgical shoe with peg assist insert with cutout for wound for offloading*** Additional Orders / Instructions Follow Nutritious Diet - Monitor/Control Blood Sugars Non Wound Condition Other Non Wound Condition Orders/Instructions: - Apply corn pad or gauze between great toe and 2nd toe. Wound Treatment Wound #4 - Metatarsal head first Wound Laterality: Plantar, Left Cleanser: Soap and Water 1 x Per Day/30 Days Discharge Instructions: May shower and wash wound with dial antibacterial soap and water prior to dressing change. Cleanser: Wound Cleanser (Generic) 1 x Per Day/30 Days Discharge Instructions: Cleanse the wound with wound cleanser prior to applying [name] clean dressing using gauze sponges, not tissue or cotton balls. Peri-Wound Care: Skin Prep (Generic) 1 x Per Day/30 Days Discharge Instructions: Use skin prep as directed Topical: BlastX 1 x Per Day/30 Days Prim Dressing: Hydrofera Blue Ready Transfer Foam, 2.5x2.5 (in/in) 1 x Per Day/30 Days ary Discharge Instructions: Apply directly to wound bed as directed Secondary Dressing: ALLEVYN Gentle Border, 3x3 (in/in) (Generic) 1 x Per Day/30 Days Discharge Instructions: Apply over primary dressing as directed. Secondary Dressing: Optifoam Non-Adhesive Dressing, 4x4 in 1 x Per Day/30 Days Discharge Instructions: Apply foam donut over primary dressing as directed. Electronic Signature(s) Signed: 04/19/2022 3:38:55 PM By: [name] DO Entered By: [name] on 04/19/2022 15:37:12 Holly [name], Holly [name] ([id number]) [id number] Page 5 of  11 -------------------------------------------------------------------------------- Problem List Details Patient Name: Date of Service: Holly Alar [name]. 04/19/2022 3:15 PM Medical Record Number: [id number] Patient Account Number: [id number] Date of Birth/Sex: Treating RN: [birth year] (73 y.o. F) Primary Care Provider: [name] Other Clinician: Referring Provider: Treating  Provider/Extender: [name] Weeks in Treatment: 25 Active Problems ICD-10 Encounter Code Description Active Date MDM Diagnosis L97.522 Non-pressure chronic ulcer of other part of left foot with fat layer exposed 10/21/2021 No Yes E11.621 Type 2 diabetes mellitus with foot ulcer 10/21/2021 No Yes E11.42 Type 2 diabetes mellitus with diabetic polyneuropathy 10/21/2021 No Yes Inactive Problems Resolved Problems Electronic Signature(s) Signed: 04/19/2022 3:38:55 PM By: [name] DO Entered By: [name] on 04/19/2022 15:35:40 -------------------------------------------------------------------------------- Progress Note Details Patient Name: Date of Service: Holly [name], Holly RA H [name]. 04/19/2022 3:15 PM Medical Record Number: [id number] Patient Account Number: [id number] Date of Birth/Sex: Treating RN: [birth year] (72 y.o. F) Primary Care Provider: [name] Other Clinician: Referring Provider: Treating Provider/Extender: [name] in Treatment: 25 Subjective Chief Complaint Information obtained from Patient Left plantar ulcer 11/12/2019; patient is here referred by podiatry for review of [name] wound on the left first plantar metatarsal head 03/30/2021; right second toe wound s/p Trauma 10/21/2021; First left metatarsal head wound History of Present Illness (HPI) 05/10/17 patient presents today for initial evaluation and our clinic concerning issues that she has been having with an ulceration on the plantar aspect of the left first metatarsal head. She has  been seen [name] for this since August 2018. Subsequently he has recommended bunion surgery as that seems to be the culprit for why there is pressure and friction occurring at the site causing the callous and subsequently the wound. 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 With that being said I do have notes from several of his visits one of which does include an x-ray documentation stating that she had [name] negative x-ray. There was obviously the bunion the notes I have extended from 11 deformity. With that being said she has not had an MRI fortunately this wound does not probe to bone. The notes I have extended from February 08, 2017 through April 14, 2017. During that course she was also placed on antibiotics to help with an infection two weeks ago and this was doxycycline. Fortunately that seems to have completely resolved any infection issues that she may have had. Unfortunately this is an ulcer that she has actually been dealing with since April 2018. She just initially was trying to treat it and care for it on her own. Her most recent Holly [name], Holly [name] ([id number]) [id number] Page 6 of 11 hemoglobin A1c was 5.8, she is [name] non-smoker, she does have [name] right first toe amputation and [name] left third toe amputation. Fortunately she's not having any discomfort at the site. 05/17/17 on evaluation today patient's ulcer on the plantar aspect of the first metatarsal region appears to be close at this point. There does not appear to be any evidence of ulcer opening. This is on initial inspection. She has not really had any discomfort but honestly she really was not experiencing [name] lot of pain even before. There is no evidence of infection or fluctuation under the callous which is present at this point. ADMISSION 11/12/2019 This  is [name] 72 year old woman that we previously had in this clinic in 2017 for two visits with [name] wound I think roughly in the same area. At that point she had [name] bunion in the left. She has apparently had surgery on this since then. Looking through care everywhere we can see that she  has had follow-up with podiatry from 06/12/2019 through 10/28/2019 for [name] wound on the plantar aspect of her left first MTP. She states when this started she was having severe knee pain on the right which forced her to alter her gait. She has recently received doxycycline which she is finished and Bactroban. She is back to using Medihoney on the wound. She is offloading with the Pegasys shoe. Past medical history includes type 2 diabetes with [name] recent hemoglobin A1c of 6.8, severe type II diabetic neuropathy, history of [name] CVA, stage III chronic renal failure hypertension, depression, of bilateral previous bunion surgery, right first toe amputation and [name] partial left third toe amputation for osteomyelitis. The patient states that she recently had work to do in her mother's house in [place] and so she was on her feet quite [name] lot which delayed the healing. ABI in our clinic was 1.1 on the left 8/31; plantar aspect of her left first metatarsal head. Using silver collagen. This is probably [name] surgical wound 9/14; this is [name] patient who had [name] wound on the plantar aspect of her left first metatarsal head in the setting of [name] significant bunion deformity. We use cervical collagen and [name] offloading shoe. She has significant diabetic neuropathy probably some degree of [name] Charcot deformity in her feet. This is closed over today. Readmission 03/30/2021 Ms. Vernona [name] is [name] 72 year old female with [name] past medical history of insulin-dependent controlled type 2 diabetes s/p right great toe amputation, right knee osteoarthritis and hypertension that presents to the clinic for [name] second right toe wound. She states she was getting out of the bathtub when  her foot got caught the tub and the skin to her second toe was split open. She noticed this after the bath when she saw blood on the floor. She has neuropathy in her feet. She visited the ED following the event on 03/26/2021. They obtained x-rays that showed [name] fracture to the fifth toe but no fracture to the second toe. She was started on doxycycline. She currently denies signs of infection. She has been keeping the area covered with [name] Band-Aid. Readmission 10/21/2021 Ms. Holly [name] is [name] 72 year old female with [name] past medical history of insulin dependent controlled type 2 diabetes complicated by peripheral neuropathy that presents to the clinic for [name] left met head wound. She has been following with Dr. Earleen Newport, podiatry for over [name] year for this issue. She has tried collagen, mupirocin ointment and Medihoney. She is at high fall risk and cannot tolerate [name] total contact cast. She has orthotics with specialized inserts for her current issue. She currently denies signs of infection. 8/10; patient presents for follow-up. Unfortunately Regranex was unaffordable at $1200. She has been using Medihoney to the wound bed. She reports aggressively offloading the area. She has offloading inserts. She denies signs of infection. 8/22; patient presents for follow-up. She has been using Medihoney to the wound bed with improvement in wound healing. She has no issues or complaints today. She will be out of town starting next week for the next 2 weeks. 9/11; patient presents for follow-up. She has been using Medihoney to the wound bed. She went on vacation and was last seen 3 weeks ago. She states she had tried to aggressively offload the wound bed. 9/26; patient presents for follow-up. She has been using Medihoney to the wound bed. She has no issues or complaints today. 10/13; patient presents for follow-up. She has been using Medihoney to the wound bed. She  has no issues or complaints today. We rediscussed the  possibility of [name] total contact cast but she does not feel comfortable as she is [name] high fall risk and lives alone. 10/26; patient presents for follow-up. Has been using Medihoney to the wound bed. She has been approved for Grafix however has [name] 20% co-pay. Patient declined proceeding with this due to financial cost. Patient has no issues or complaints today. 11/16; patient presents for follow-up. She has been using Medihoney to the wound bed. She has been using her diabetic shoes with an offloading foam donut. She declines total contact cast placement today. 11/30; patient presents for follow-up. She has been using Medihoney to the wound bed. She did not receive silver alginate. She has been using the Pegasys with surgical shoe. There has been improvement in wound healing. 12/8; patient presents for follow-up. She has been using Medihoney and silver alginate to the wound bed. She has been using the Pegasys with surgical shoe without issues. 12/18; patient presents for follow-up. She has been using Medihoney and silver alginate to the wound bed. She continues to use her Pegasys with [name] surgical shoe. She has no issues or complaints today. 12/28; patient presents for follow-up. She has been using Medihoney and silver alginate to the wound bed. She continues to use her peg assist with [name] surgical shoe. She states she tried to aggressively offload the wound bed this week. She has no issues or complaints today. 1/18; patient missed her last clinic appointment due to conjunctivitis. She has been using Medihoney and silver alginate to the wound bed. She continues to use her peg assist with her surgical shoe. 1/23; patient presents for follow-up. She has been using blast X and Hydrofera Blue to the wound bed. She uses her peg assist with her surgical shoe. She has no issues or complaints today. 1/30; patient presents for follow-up. She has been using blast X and Hydrofera Blue to the wound bed. She continues  to use her peg assist with her surgical shoe. She has no issues or complaints today. She has not received her blast X but still has ointment and the samples that were given to her. Patient History Information obtained from Patient. Family History Cancer - Father, Hypertension - Mother,Father,Siblings, Stroke - Mother,Father, No family history of Diabetes, Heart Disease, Hereditary Spherocytosis, Kidney Disease, Lung Disease, Seizures, Thyroid Problems, Tuberculosis. Social History Never smoker, Marital Status - Single, Alcohol Use - Never, Drug Use - No History, Caffeine Use - Rarely. Medical History Eyes RAMA, SORCI [name] ([id number]) [id number] Page 7 of 11 Patient has history of Cataracts Denies history of Glaucoma, Optic Neuritis Ear/Nose/Mouth/Throat Denies history of Chronic sinus problems/congestion, Middle ear problems Hematologic/Lymphatic Denies history of Anemia, Hemophilia, Human Immunodeficiency Virus, Lymphedema, Sickle Cell Disease Respiratory Patient has history of Sleep Apnea Denies history of Aspiration, Asthma, Chronic Obstructive Pulmonary Disease (COPD), Pneumothorax, Tuberculosis Cardiovascular Patient has history of Hypertension Denies history of Angina, Arrhythmia, Congestive Heart Failure, Coronary Artery Disease, Deep Vein Thrombosis, Hypotension, Myocardial Infarction, Peripheral Arterial Disease, Peripheral Venous Disease, Phlebitis, Vasculitis Gastrointestinal Denies history of Cirrhosis , Colitis, Crohnoos, Hepatitis [name], Hepatitis B, Hepatitis C Endocrine Patient has history of Type II Diabetes Denies history of Type I Diabetes Genitourinary Denies history of End Stage Renal Disease Immunological Denies history of Lupus Erythematosus, Raynaudoos, Scleroderma Integumentary (Skin) Denies history of History of Burn Musculoskeletal Patient has history of Osteoarthritis, Osteomyelitis - Hx in 2007 (left 3rd toe) Denies history  of Gout, Rheumatoid Arthritis Neurologic Patient has  history of Neuropathy Denies history of Dementia, Quadriplegia, Paraplegia, Seizure Disorder Oncologic Patient has history of Received Radiation - 03/2020 Denies history of Received Chemotherapy Psychiatric Patient has history of Confinement Anxiety Denies history of Anorexia/bulimia Hospitalization/Surgery History - Amputation of right great toe. - Tendon repair. - Hysterectomy. - Osteomyelitis left 3rd toe. - cervical fusion with cage. - left breast lumpectomy. Medical [name] Surgical History Notes nd Constitutional Symptoms (General Health) morbid obesity Cardiovascular Takotsubo cardiomyopathy 2012, hyperlipidemia, Hx CVA, Tachycardia Genitourinary CKD stage 3 Musculoskeletal left leg weakness Neurologic CVA Oncologic hx endometrial cancer, left breast CA Psychiatric depression Objective Constitutional respirations regular, non-labored and within target range for patient.. Vitals Time Taken: 3:08 PM, Temperature: 97.7 F, Pulse: 76 bpm, Respiratory Rate: 18 breaths/min, Blood Pressure: 160/83 mmHg. Cardiovascular 2+ dorsalis pedis/posterior tibialis pulses. Psychiatric pleasant and cooperative. General Notes: Left foot: T the left first metatarsal there is an open wound with granulation tissue, nonviable tissue and callus. Postdebridement there is o healthy granulation tissue. No surrounding signs of infection. Integumentary (Hair, Skin) Wound #4 status is Open. Original cause of wound was Gradually Appeared. The date acquired was: 10/02/2020. The wound has been in treatment 25 weeks. The wound is located on the Left,Plantar Metatarsal head first. The wound measures 0.5cm length x 0.5cm width x 0.3cm depth; 0.196cm^2 area and 0.059cm^3 volume. There is Fat Layer (Subcutaneous Tissue) exposed. There is no tunneling or undermining noted. There is [name] medium amount of serosanguineous drainage noted. The wound margin is distinct  with the outline attached to the wound base. There is large (67-100%) pink, pale granulation within the wound bed. There is no necrotic tissue within the wound bed. The periwound skin appearance exhibited: Callus. The periwound skin appearance did not exhibit: Crepitus, Excoriation, Induration, Rash, Scarring, Dry/Scaly, Maceration, Atrophie Blanche, Cyanosis, Ecchymosis, Hemosiderin Staining, Mottled, Pallor, Rubor, [place], Holly [name] ([id number]) [phone].pdf Page 8 of 11 Erythema. Periwound temperature was noted as No Abnormality. Assessment Active Problems ICD-10 Non-pressure chronic ulcer of other part of left foot with fat layer exposed Type 2 diabetes mellitus with foot ulcer Type 2 diabetes mellitus with diabetic polyneuropathy Patient's wound has shown improvement in size in appearance since last clinic visit. I debrided nonviable tissue. I recommended continuing the course with blast X and Hydrofera Blue daily and aggressive offloading with surgical shoe and peg assist. We gave her the number to call to assure she receives blast X. Follow-up in 1 week. Procedures Wound #4 Pre-procedure diagnosis of Wound #4 is [name] Diabetic Wound/Ulcer of the Lower Extremity located on the Left,Plantar Metatarsal head first .Severity of Tissue Pre Debridement is: Fat layer exposed. There was [name] Excisional Skin/Subcutaneous Tissue Debridement with [name] total area of 0.25 sq cm performed by [name], DO. With the following instrument(s): Curette to remove Viable and Non-Viable tissue/material. Material removed includes Callus, Subcutaneous Tissue, and Slough after achieving pain control using Lidocaine. No specimens were taken. [name] time out was conducted at 15:23, prior to the start of the procedure. [name] Minimum amount of bleeding was controlled with Pressure. The procedure was tolerated well with [name] pain level of 0 throughout and [name] pain level of 0 following the procedure. Post  Debridement Measurements: 0.5cm length x 0.5cm width x 0.3cm depth; 0.059cm^3 volume. Character of Wound/Ulcer Post Debridement is improved. Severity of Tissue Post Debridement is: Fat layer exposed. Post procedure Diagnosis Wound #4: Same as Pre-Procedure Plan Follow-up Appointments: Return Appointment in 1 week. - w/ Dr. Heber [place] Tuesday 04/26/22 Rm # 7 Other: -  Prism=Supplies Anesthetic: (In clinic) Topical Lidocaine 5% applied to wound bed (In clinic) Topical Lidocaine 4% applied to wound bed Cellular or Tissue Based Products: Cellular or Tissue Based Product Type: - IVR for Grafix IVR for [employer]=$225 Copay Bathing/ Shower/ Hygiene: May shower and wash wound with soap and water. Edema Control - Lymphedema / SCD / Other: Avoid standing for long periods of time. Off-Loading: Other: - Diabetic Shoes Relieve pressure/time standing of feet ***Use surgical shoe with peg assist insert with cutout for wound for offloading*** Additional Orders / Instructions: Follow Nutritious Diet - Monitor/Control Blood Sugars Non Wound Condition: Other Non Wound Condition Orders/Instructions: - Apply corn pad or gauze between great toe and 2nd toe. WOUND #4: - Metatarsal head first Wound Laterality: Plantar, Left Cleanser: Soap and Water 1 x Per Day/30 Days Discharge Instructions: May shower and wash wound with dial antibacterial soap and water prior to dressing change. Cleanser: Wound Cleanser (Generic) 1 x Per Day/30 Days Discharge Instructions: Cleanse the wound with wound cleanser prior to applying [name] clean dressing using gauze sponges, not tissue or cotton balls. Peri-Wound Care: Skin Prep (Generic) 1 x Per Day/30 Days Discharge Instructions: Use skin prep as directed Topical: BlastX 1 x Per Day/30 Days Prim Dressing: Hydrofera Blue Ready Transfer Foam, 2.5x2.5 (in/in) 1 x Per Day/30 Days ary Discharge Instructions: Apply directly to wound bed as directed Secondary Dressing: ALLEVYN Gentle  Border, 3x3 (in/in) (Generic) 1 x Per Day/30 Days Discharge Instructions: Apply over primary dressing as directed. Secondary Dressing: Optifoam Non-Adhesive Dressing, 4x4 in 1 x Per Day/30 Days Discharge Instructions: Apply foam donut over primary dressing as directed. 1. In office sharp debridement 2. [facility] blast X and Hydrofera Blue 3. Aggressive offloadingoosurgical shoe with peg assist 4. Follow-up in 1 week Electronic Signature(s) Signed: 04/19/2022 3:38:55 PM By: Ulice Dash, [facility] [name] [phone] By: [name] DO [id number] Page 9 of 11 Signed: 04/19/2022 3:38:55 Entered By: [name] on 04/19/2022 15:38:11 -------------------------------------------------------------------------------- HxROS Details Patient Name: Date of Service: Holly Alar [name]. 04/19/2022 3:15 PM Medical Record Number: [id number] Patient Account Number: [id number] Date of Birth/Sex: Treating RN: 1951/02/16 (72 y.o. F) Primary Care Provider: [name] Other Clinician: Referring Provider: Treating Provider/Extender: [name] in Treatment: 25 Information Obtained From Patient Constitutional Symptoms (General Health) Medical History: Past Medical History Notes: morbid obesity Eyes Medical History: Positive for: Cataracts Negative for: Glaucoma; Optic Neuritis Ear/Nose/Mouth/Throat Medical History: Negative for: Chronic sinus problems/congestion; Middle ear problems Hematologic/Lymphatic Medical History: Negative for: Anemia; Hemophilia; Human Immunodeficiency Virus; Lymphedema; Sickle Cell Disease Respiratory Medical History: Positive for: Sleep Apnea Negative for: Aspiration; Asthma; Chronic Obstructive Pulmonary Disease (COPD); Pneumothorax; Tuberculosis Cardiovascular Medical History: Positive for: Hypertension Negative for: Angina; Arrhythmia; Congestive Heart Failure; Coronary Artery Disease; Deep  Vein Thrombosis; Hypotension; Myocardial Infarction; Peripheral Arterial Disease; Peripheral Venous Disease; Phlebitis; Vasculitis Past Medical History Notes: Takotsubo cardiomyopathy 2012, hyperlipidemia, Hx CVA, Tachycardia Gastrointestinal Medical History: Negative for: Cirrhosis ; Colitis; Crohns; Hepatitis [name]; Hepatitis B; Hepatitis C Endocrine Medical History: Positive for: Type II Diabetes Negative for: Type I Diabetes Time with diabetes: over 20 years Treated with: Insulin, Oral agents Blood sugar tested every day: Yes Tested : daily Genitourinary Medical History: Negative for: End Stage Renal Disease Past Medical History Notes: CKD stage 3 Holly [name], Holly [name] ([id number]) [id number] Page 10 of 11 Immunological Medical History: Negative for: Lupus Erythematosus; Raynauds; Scleroderma Integumentary (Skin) Medical History: Negative for: History of Burn Musculoskeletal Medical History: Positive for: Osteoarthritis; Osteomyelitis -  Hx in 2007 (left 3rd toe) Negative for: Gout; Rheumatoid Arthritis Past Medical History Notes: left leg weakness Neurologic Medical History: Positive for: Neuropathy Negative for: Dementia; Quadriplegia; Paraplegia; Seizure Disorder Past Medical History Notes: CVA Oncologic Medical History: Positive for: Received Radiation - 03/2020 Negative for: Received Chemotherapy Past Medical History Notes: hx endometrial cancer, left breast CA Psychiatric Medical History: Positive for: Confinement Anxiety Negative for: Anorexia/bulimia Past Medical History Notes: depression HBO Extended History Items Eyes: Cataracts Immunizations Pneumococcal Vaccine: Received Pneumococcal Vaccination: Yes Received Pneumococcal Vaccination On or After 60th Birthday: Yes Implantable Devices None Hospitalization / Surgery History Type of Hospitalization/Surgery Amputation of right great toe Tendon  repair Hysterectomy Osteomyelitis left 3rd toe cervical fusion with cage left breast lumpectomy Family and Social History Cancer: Yes - Father; Diabetes: No; Heart Disease: No; Hereditary Spherocytosis: No; Hypertension: Yes - Mother,Father,Siblings; Kidney Disease: No; Lung Disease: No; Seizures: No; Stroke: Yes - Mother,Father; Thyroid Problems: No; Tuberculosis: No; Never smoker; Marital Status - Single; Alcohol Use: Never; Drug Use: No History; Caffeine Use: Rarely; Financial Concerns: No; Food, Clothing or Shelter Needs: No; Support System Lacking: No; Transportation Concerns: No Electronic Signature(s) Signed: 04/19/2022 3:38:55 PM By: [name] DO Entered By: [name] on 04/19/2022 15:36:49 Holly [name], Holly [name] ([id number]) [id number] Page 11 of 11 -------------------------------------------------------------------------------- SuperBill Details Patient Name: Date of Service: Holly [name] 04/19/2022 Medical Record Number: [id number] Patient Account Number: [id number] Date of Birth/Sex: Treating RN: 1951/03/11 (73 y.o. Holly [name], Holly [name] Primary Care Provider: [name] Other Clinician: Referring Provider: Treating Provider/Extender: [name] Weeks in Treatment: 25 Diagnosis Coding ICD-10 Codes Code Description [phone] Non-pressure chronic ulcer of other part of left foot with fat layer exposed E11.621 Type 2 diabetes mellitus with foot ulcer E11.42 Type 2 diabetes mellitus with diabetic polyneuropathy Facility Procedures : CPT4 Code: [id number] Description: 77414 - DEB SUBQ TISSUE 20 SQ CM/< ICD-10 Diagnosis Description L97.522 Non-pressure chronic ulcer of other part of left foot with fat layer exposed E11.621 Type 2 diabetes mellitus with foot ulcer Modifier: Quantity: 1 Physician Procedures : CPT4 Code Description Modifier [id number] - WC PHYS SUBQ TISS 20 SQ CM ICD-10 Diagnosis Description  L97.522 Non-pressure chronic ulcer of other part of left foot with fat layer exposed E11.621 Type 2 diabetes mellitus with foot ulcer Quantity: 1 Electronic Signature(s) Signed: 04/19/2022 3:38:55 PM By: [name] DO Entered By: [name] on 04/19/2022 15:38:34

## 2022-04-26 ENCOUNTER — Encounter (HOSPITAL_BASED_OUTPATIENT_CLINIC_OR_DEPARTMENT_OTHER): Payer: PPO | Attending: Internal Medicine | Admitting: Internal Medicine

## 2022-04-26 DIAGNOSIS — Z89411 Acquired absence of right great toe: Secondary | ICD-10-CM | POA: Diagnosis not present

## 2022-04-26 DIAGNOSIS — E1142 Type 2 diabetes mellitus with diabetic polyneuropathy: Secondary | ICD-10-CM | POA: Insufficient documentation

## 2022-04-26 DIAGNOSIS — M1990 Unspecified osteoarthritis, unspecified site: Secondary | ICD-10-CM | POA: Insufficient documentation

## 2022-04-26 DIAGNOSIS — I129 Hypertensive chronic kidney disease with stage 1 through stage 4 chronic kidney disease, or unspecified chronic kidney disease: Secondary | ICD-10-CM | POA: Diagnosis not present

## 2022-04-26 DIAGNOSIS — E11621 Type 2 diabetes mellitus with foot ulcer: Secondary | ICD-10-CM | POA: Insufficient documentation

## 2022-04-26 DIAGNOSIS — E1122 Type 2 diabetes mellitus with diabetic chronic kidney disease: Secondary | ICD-10-CM | POA: Diagnosis not present

## 2022-04-26 DIAGNOSIS — E1151 Type 2 diabetes mellitus with diabetic peripheral angiopathy without gangrene: Secondary | ICD-10-CM | POA: Insufficient documentation

## 2022-04-26 DIAGNOSIS — E785 Hyperlipidemia, unspecified: Secondary | ICD-10-CM | POA: Diagnosis not present

## 2022-04-26 DIAGNOSIS — G4730 Sleep apnea, unspecified: Secondary | ICD-10-CM | POA: Diagnosis not present

## 2022-04-26 DIAGNOSIS — N183 Chronic kidney disease, stage 3 unspecified: Secondary | ICD-10-CM | POA: Diagnosis not present

## 2022-04-26 DIAGNOSIS — L97522 Non-pressure chronic ulcer of other part of left foot with fat layer exposed: Secondary | ICD-10-CM | POA: Insufficient documentation

## 2022-04-26 DIAGNOSIS — Z794 Long term (current) use of insulin: Secondary | ICD-10-CM | POA: Diagnosis not present

## 2022-04-27 NOTE — Progress Notes (Signed)
MILTON, STREICHER [name] ([id number]) [id number] Page 1 of 8 Visit Report for 04/26/2022 Arrival Information Details Patient Name: Date of Service: [name]. 04/26/2022 2:30 PM Medical Record Number: [id number] Patient Account Number: [id number] Date of Birth/Sex: Treating RN: 1951/02/12 (72 y.o. F) Primary Care Mahkayla Preece: [name] Other Clinician: Referring Dalyah Pla: Treating Bernard Slayden/Extender: [name] in Treatment: 26 Visit Information History Since Last Visit Added or deleted any medications: No Patient Arrived: Cane Any new allergies or adverse reactions: No Arrival Time: 14:28 Had [name] fall or experienced change in No Accompanied By: self activities of daily living that may affect Transfer Assistance: None risk of falls: Patient Identification Verified: Yes Signs or symptoms of abuse/neglect since last visito No Secondary Verification Process Completed: Yes Hospitalized since last visit: No Patient Requires Transmission-Based Precautions: No Implantable device outside of the clinic excluding No Patient Has Alerts: Yes cellular tissue based products placed in the center Patient Alerts: ABI's R=1.07 L=1.02 since last visit: Has Dressing in Place as Prescribed: Yes Pain Present Now: No Electronic Signature(s) Signed: 04/27/2022 4:14:43 PM By: Erenest Blank Entered By: Erenest Blank on 04/26/2022 14:29:07 -------------------------------------------------------------------------------- Encounter Discharge Information Details Patient Name: Date of Service: [name], [name] 04/26/2022 2:30 PM Medical Record Number: [id number] Patient Account Number: [id number] Date of Birth/Sex: Treating RN: [birth year] (72 y.o. [name], Tammi Klippel Primary Care Jaasia Viglione: [name] Other Clinician: Referring Suda Forbess: Treating Judit Awad/Extender: [name] in Treatment: 26 Encounter Discharge Information  Items Post Procedure Vitals Discharge Condition: Stable Temperature (F): 97.1 Ambulatory Status: Ambulatory Pulse (bpm): 71 Discharge Destination: Home Respiratory Rate (breaths/min): 18 Transportation: Private Auto Blood Pressure (mmHg): 147/75 Accompanied By: self Schedule Follow-up Appointment: Yes Clinical Summary of Care: Electronic Signature(s) Signed: 04/26/2022 6:17:04 PM By: Deon Pilling RN, BSN Entered By: Deon Pilling on 04/26/2022 14:54:09 Tamez, Eilleen [name] ([id number]) [id number] Page 2 of 8 -------------------------------------------------------------------------------- Lower Extremity Assessment Details Patient Name: Date of Service: [name]. 04/26/2022 2:30 PM Medical Record Number: [id number] Patient Account Number: [id number] Date of Birth/Sex: Treating RN: [birth year] (72 y.o. F) Primary Care Anber Mckiver: [name] Other Clinician: Referring Yi Falletta: Treating Monifah Freehling/Extender: [name] Weeks in Treatment: 26 Edema Assessment Assessed: [Left: No] [Right: No] Edema: [Left: N] [Right: o] Calf Left: Right: Point of Measurement: 34 cm From Medial Instep 47.4 cm Ankle Left: Right: Point of Measurement: 9 cm From Medial Instep 29.5 cm Electronic Signature(s) Signed: 04/27/2022 4:14:43 PM By: Erenest Blank Entered By: Erenest Blank on 04/26/2022 14:37:45 -------------------------------------------------------------------------------- Multi Wound Chart Details Patient Name: Date of Service: [name], [name] 04/26/2022 2:30 PM Medical Record Number: [id number] Patient Account Number: [id number] Date of Birth/Sex: Treating RN: 03-04-1951 (72 y.o. F) Primary Care Demontez Novack: [name] Other Clinician: Referring Sharise Lippy: Treating Antonietta Lansdowne/Extender: [name] in Treatment: 26 Vital Signs Height(in): Pulse(bpm): 15 Weight(lbs): Blood Pressure(mmHg): 147/75 Body Mass  Index(BMI): Temperature(F): 97.7 Respiratory Rate(breaths/min): 18 [4:Photos:] [N/[name]:N/[name]] Left, Plantar Metatarsal head first N/[name] N/[name] Wound Location: Gradually Appeared N/[name] N/[name] Wounding Event: Diabetic Wound/Ulcer of the Lower N/[name] N/[name] Primary Etiology: Extremity Cataracts, Sleep Apnea, Hypertension, N/[name] N/[name] Comorbid History: Type II Diabetes, Osteoarthritis, Knauer, Maryon [name] ([id number]) [id number] Page 3 of 8 Osteomyelitis, Neuropathy, Received Radiation, Confinement Anxiety 10/02/2020 N/[name] N/[name] Date Acquired: 54 N/[name] N/[name] Weeks of Treatment: Open N/[name] N/[name] Wound Status: No N/[name] N/[name] Wound Recurrence: 0.5x0.4x0.2 N/[name] N/[name] Measurements L x W x D (cm) 0.157 N/[name] N/[name] [name] (cm) :  rea 0.031 N/[name] N/[name] Volume (cm) : 61.50% N/[name] N/[name] % Reduction in [name] rea: 74.80% N/[name] N/[name] % Reduction in Volume: Grade 1 N/[name] N/[name] Classification: Medium N/[name] N/[name] Exudate [name] mount: Serosanguineous N/[name] N/[name] Exudate Type: red, brown N/[name] N/[name] Exudate Color: Distinct, outline attached N/[name] N/[name] Wound Margin: Large (67-100%) N/[name] N/[name] Granulation [name] mount: Pink, Pale N/[name] N/[name] Granulation Quality: None Present (0%) N/[name] N/[name] Necrotic [name] mount: Fat Layer (Subcutaneous Tissue): Yes N/[name] N/[name] Exposed Structures: Fascia: No Tendon: No Muscle: No Joint: No Bone: No Large (67-100%) N/[name] N/[name] Epithelialization: Debridement - Excisional N/[name] N/[name] Debridement: Pre-procedure Verification/Time Out 14:45 N/[name] N/[name] Taken: Lidocaine 4% Topical Solution N/[name] N/[name] Pain Control: Callus, Subcutaneous, Slough N/[name] N/[name] Tissue Debrided: Skin/Subcutaneous Tissue N/[name] N/[name] Level: 4 N/[name] N/[name] Debridement [name] (sq cm): rea Curette N/[name] N/[name] Instrument: Minimum N/[name] N/[name] Bleeding: Pressure N/[name] N/[name] Hemostasis [name] chieved: 0 N/[name] N/[name] Procedural Pain: 0 N/[name] N/[name] Post Procedural Pain: Procedure was tolerated well N/[name] N/[name] Debridement Treatment Response: 0.5x0.4x0.2 N/[name] N/[name] Post Debridement Measurements L x W x D (cm) 0.031  N/[name] N/[name] Post Debridement Volume: (cm) Callus: Yes N/[name] N/[name] Periwound Skin Texture: Excoriation: No Induration: No Crepitus: No Rash: No Scarring: No Maceration: No N/[name] N/[name] Periwound Skin Moisture: Dry/Scaly: No Atrophie Blanche: No N/[name] N/[name] Periwound Skin Color: Cyanosis: No Ecchymosis: No Erythema: No Hemosiderin Staining: No Mottled: No Pallor: No Rubor: No No Abnormality N/[name] N/[name] Temperature: Debridement N/[name] N/[name] Procedures Performed: Treatment Notes Wound #4 (Metatarsal head first) Wound Laterality: Plantar, Left Cleanser Soap and Water Discharge Instruction: May shower and wash wound with dial antibacterial soap and water prior to dressing change. Wound Cleanser Discharge Instruction: Cleanse the wound with wound cleanser prior to applying [name] clean dressing using gauze sponges, not tissue or cotton balls. Peri-Wound Care Skin Prep Discharge Instruction: Use skin prep as directed Topical BlastX Primary Dressing Hydrofera Blue Ready Transfer Foam, 2.5x2.5 (in/in) Discharge Instruction: Apply directly to wound bed as directed Secondary Dressing TIFFANY, CALMES [name] ([id number]) [id number] Page 4 of 8 ALLEVYN Gentle Border, 3x3 (in/in) Discharge Instruction: Apply over primary dressing as directed. Optifoam Non-Adhesive Dressing, 4x4 in Discharge Instruction: Apply foam donut over primary dressing as directed. Secured With Compression Wrap Compression Stockings Environmental education officer) Signed: 04/26/2022 3:36:48 PM By: [name] DO Entered By: [name] on 04/26/2022 14:55:38 -------------------------------------------------------------------------------- Multi-Disciplinary Care Plan Details Patient Name: Date of Service: [name], [name] 04/26/2022 2:30 PM Medical Record Number: [id number] Patient Account Number: [id number] Date of Birth/Sex: Treating RN: 06-10-1950 (72 y.o. [name], Tammi Klippel Primary Care Sindee Stucker:  [name] Other Clinician: Referring Maelynn Moroney: Treating Saskia Simerson/Extender: [name] in Treatment: 26 Active Inactive Nutrition Nursing Diagnoses: Impaired glucose control: actual or potential Goals: Patient/caregiver verbalizes understanding of need to maintain therapeutic glucose control per primary care physician Date Initiated: 10/21/2021 Target Resolution Date: 05/20/2022 Goal Status: Active Interventions: Assess HgA1c results as ordered upon admission and as needed Provide education on elevated blood sugars and impact on wound healing Notes: Wound/Skin Impairment Nursing Diagnoses: Impaired tissue integrity Goals: Patient/caregiver will verbalize understanding of skin care regimen Date Initiated: 10/21/2021 Target Resolution Date: 06/17/2022 Goal Status: Active Ulcer/skin breakdown will have [name] volume reduction of 30% by week 4 Date Initiated: 10/21/2021 Date Inactivated: 11/29/2021 Target Resolution Date: 11/18/2021 Goal Status: Met Interventions: Assess patient/caregiver ability to obtain necessary supplies Assess patient/caregiver ability to perform ulcer/skin care regimen upon admission and as needed Assess ulceration(s) every visit Provide education on ulcer and skin care  Treatment Activities: Topical wound management initiated : 10/21/2021 BESSYE, STITH [name] ([id number]) [id number] Page 5 of 8 Notes: 11/29/21: Wound care regimen continues. Running IVR for skin sub Electronic Signature(s) Signed: 04/26/2022 6:17:04 PM By: Deon Pilling RN, BSN Entered By: Deon Pilling on 04/26/2022 14:52:56 -------------------------------------------------------------------------------- Pain Assessment Details Patient Name: Date of Service: [name], [name] 04/26/2022 2:30 PM Medical Record Number: [id number] Patient Account Number: [id number] Date of Birth/Sex: Treating RN: May 05, 1950 (72 y.o. F) Primary Care Shacora Zynda: [name] Other Clinician: Referring Aleyssa Pike: Treating Laelynn Blizzard/Extender: [name] in Treatment: 26 Active Problems Location of Pain Severity and Description of Pain Patient Has Paino No Site Locations Pain Management and Medication Current Pain Management: Electronic Signature(s) Signed: 04/27/2022 4:14:43 PM By: Erenest Blank Entered By: Erenest Blank on 04/26/2022 14:32:15 -------------------------------------------------------------------------------- Patient/Caregiver Education Details Patient Name: Date of Service: [name]. 2/6/2024andnbsp2:30 PM Medical Record Number: [id number] Patient Account Number: [id number] Date of Birth/Gender: Treating RN: July 14, 1950 (72 y.o. [name], Tammi Klippel Primary Care Physician: [name] Other Clinician: Referring Physician: Treating Physician/Extender: [name] in Treatment: [address] [place], [place] ([id number]) [id number] Page 6 of 8 Education Provided To: Patient Education Topics Provided Wound/Skin Impairment: Handouts: Caring for Your Ulcer Methods: Explain/Verbal Responses: Reinforcements needed Electronic Signature(s) Signed: 04/26/2022 6:17:04 PM By: Deon Pilling RN, BSN Entered By: Deon Pilling on 04/26/2022 14:53:08 -------------------------------------------------------------------------------- Wound Assessment Details Patient Name: Date of Service: [name], [name] 04/26/2022 2:30 PM Medical Record Number: [id number] Patient Account Number: [id number] Date of Birth/Sex: Treating RN: 05/13/1950 (72 y.o. F) Primary Care Alveena Taira: [name] Other Clinician: Referring Karsten Vaughn: Treating Gianfranco Araki/Extender: [name] Weeks in Treatment: 26 Wound Status Wound Number: 4 Primary Diabetic Wound/Ulcer of the Lower Extremity Etiology: Wound Location: Left, Plantar Metatarsal head  first Wound Open Wounding Event: Gradually Appeared Status: Date Acquired: 10/02/2020 Comorbid Cataracts, Sleep Apnea, Hypertension, Type II Diabetes, Weeks Of Treatment: 26 History: Osteoarthritis, Osteomyelitis, Neuropathy, Received Radiation, Clustered Wound: No Confinement Anxiety Photos Wound Measurements Length: (cm) 0.5 Width: (cm) 0.4 Depth: (cm) 0.2 Area: (cm) 0.157 Volume: (cm) 0.031 % Reduction in Area: 61.5% % Reduction in Volume: 74.8% Epithelialization: Large (67-100%) Tunneling: No Undermining: No Wound Description Classification: Grade 1 Wound Margin: Distinct, outline attached Exudate Amount: Medium Exudate Type: Serosanguineous Exudate Color: red, brown Foul Odor After Cleansing: No Slough/Fibrino No Wound Bed Granulation Amount: Large (67-100%) Exposed Structure Granulation Quality: Pink, Pale Fascia Exposed: No Necrotic Amount: None Present (0%) Fat Layer (Subcutaneous Tissue) Exposed: Yes Win[place]ur, Telfair [name] ([id number]) [id number] Page 7 of 8 Tendon Exposed: No Muscle Exposed: No Joint Exposed: No Bone Exposed: No Periwound Skin Texture Texture Color No Abnormalities Noted: No No Abnormalities Noted: No Callus: Yes Atrophie Blanche: No Crepitus: No Cyanosis: No Excoriation: No Ecchymosis: No Induration: No Erythema: No Rash: No Hemosiderin Staining: No Scarring: No Mottled: No Pallor: No Moisture Rubor: No No Abnormalities Noted: No Dry / Scaly: No Temperature / Pain Maceration: No Temperature: No Abnormality Treatment Notes Wound #4 (Metatarsal head first) Wound Laterality: Plantar, Left Cleanser Soap and Water Discharge Instruction: May shower and wash wound with dial antibacterial soap and water prior to dressing change. Wound Cleanser Discharge Instruction: Cleanse the wound with wound cleanser prior to applying [name] clean dressing using gauze sponges, not tissue or cotton balls. Peri-Wound Care Skin  Prep Discharge Instruction: Use skin prep as directed Topical BlastX Primary Dressing Hydrofera Blue Ready Transfer Foam, 2.5x2.5 (in/in) Discharge  Instruction: Apply directly to wound bed as directed Secondary Dressing ALLEVYN Gentle Border, 3x3 (in/in) Discharge Instruction: Apply over primary dressing as directed. Optifoam Non-Adhesive Dressing, 4x4 in Discharge Instruction: Apply foam donut over primary dressing as directed. Secured With Compression Wrap Compression Stockings Environmental education officer) Signed: 04/27/2022 4:14:43 PM By: Erenest Blank Entered By: Erenest Blank on 04/26/2022 14:39:16 -------------------------------------------------------------------------------- Vitals Details Patient Name: Date of Service: [name], [name] 04/26/2022 2:30 PM Medical Record Number: [id number] Patient Account Number: [id number] Date of Birth/Sex: Treating RN: 21-Feb-1951 (72 y.o. F) Primary Care Zuri Bradway: [name] Other Clinician: Referring Denese Mentink: Treating Natahsa Marian/Extender: [name] in Treatment: [address], [place] [name] ([id number]) [id number] Page 8 of 8 Vital Signs Time Taken: 14:29 Temperature (F): 97.7 Pulse (bpm): 71 Respiratory Rate (breaths/min): 18 Blood Pressure (mmHg): 147/75 Reference Range: 80 - 120 mg / dl Electronic Signature(s) Signed: 04/27/2022 4:14:43 PM By: Erenest Blank Entered By: Erenest Blank on 04/26/2022 14:32:06

## 2022-04-27 NOTE — Progress Notes (Signed)
[name], [name] [name] ([id number]) [id number] Page 1 of 11 Visit Report for 04/26/2022 Chief Complaint Document Details Patient Name: Date of Service: [name]. 04/26/2022 2:30 PM Medical Record Number: [id number] Patient Account Number: [id number] Date of Birth/Sex: Treating RN: 1950-05-05 (72 y.o. F) Primary Care Provider: Harlan [name] Other Clinician: Referring Provider: Treating Provider/Extender: [name] in Treatment: 26 Information Obtained from: Patient Chief Complaint Left plantar ulcer 11/12/2019; patient is here referred by podiatry for review of [name] wound on the left first plantar metatarsal head 03/30/2021; right second toe wound s/p Trauma 10/21/2021; First left metatarsal head wound Electronic Signature(s) Signed: 04/26/2022 3:36:48 PM By: Holly Shan DO Entered By: [name] on 04/26/2022 14:55:48 -------------------------------------------------------------------------------- Debridement Details Patient Name: Date of Service: [name], [name]. 04/26/2022 2:30 PM Medical Record Number: [id number] Patient Account Number: [id number] Date of Birth/Sex: Treating RN: 1950-12-09 (72 y.o. [name], [name] Primary Care Provider: Harlan [name] Other Clinician: Referring Provider: Treating Provider/Extender: [name] in Treatment: 26 Debridement Performed for Assessment: Wound #4 Left,Plantar Metatarsal head first Performed By: Physician Holly Shan, DO Debridement Type: Debridement Severity of Tissue Pre Debridement: Fat layer exposed Level of Consciousness (Pre-procedure): Awake and Alert Pre-procedure Verification/Time Out Yes - 14:45 Taken: Start Time: 14:46 Pain Control: Lidocaine 4% T opical Solution T Area Debrided (L x W): otal 2 (cm) x 2 (cm) = 4 (cm) Tissue and other material debrided: Viable, Non-Viable, Callus, Slough, Subcutaneous, Skin: Dermis , Skin:  Epidermis, Slough Level: Skin/Subcutaneous Tissue Debridement Description: Excisional Instrument: Curette Bleeding: Minimum Hemostasis Achieved: Pressure End Time: 14:49 Procedural Pain: 0 Post Procedural Pain: 0 Response to Treatment: Procedure was tolerated well Level of Consciousness (Post- Awake and Alert procedure): Post Debridement Measurements of Total Wound Length: (cm) 0.5 Width: (cm) 0.4 Depth: (cm) 0.2 Volume: (cm) 0.031 [name] ([id number]) [id number] Page 2 of 11 Character of Wound/Ulcer Post Debridement: Improved Severity of Tissue Post Debridement: Fat layer exposed Post Procedure Diagnosis Same as Pre-procedure Electronic Signature(s) Signed: 04/26/2022 3:36:48 PM By: Holly Shan DO Signed: 04/26/2022 6:17:04 PM By: Holly Pilling RN, BSN Entered By: [name] on 04/26/2022 14:49:53 -------------------------------------------------------------------------------- HPI Details Patient Name: Date of Service: [name], [name]. 04/26/2022 2:30 PM Medical Record Number: [id number] Patient Account Number: [id number] Date of Birth/Sex: Treating RN: Aug 17, 1950 (72 y.o. F) Primary Care Provider: Harlan [name] Other Clinician: Referring Provider: Treating Provider/Extender: [name] in Treatment: 26 History of Present Illness HPI Description: 05/10/17 patient presents today for initial evaluation and our clinic concerning issues that she has been having with an ulceration on the plantar aspect of the left first metatarsal head. She has been seen [name] for this since August 2018. Subsequently he has recommended bunion surgery as that seems to be the culprit for why there is pressure and friction occurring at the site causing the callous and subsequently the wound. 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 With that being said I do have notes from several of his visits one of which does include an x-ray documentation stating that she had [name] negative x-ray. There was obviously the bunion the notes I have extended from 11 deformity. With that being said she has not had an MRI fortunately this wound does not probe to bone. The notes  I have extended from February 08, 2017 through April 14, 2017. During that course she was also placed on antibiotics to help with an infection two weeks ago and this was doxycycline. Fortunately that seems to have completely resolved any infection issues that she may have had. Unfortunately this is an ulcer that she has actually been dealing with since April 2018. She just initially was trying to treat it and care for it on her own. Her most recent hemoglobin A1c was 5.8, she is [name] non-smoker, she does have [name] right first toe amputation and [name] left third toe amputation. Fortunately she's not having any discomfort at the site. 05/17/17 on evaluation today patient's ulcer on the plantar aspect of the first metatarsal region appears to be close at this point. There does not appear to be any evidence of ulcer opening. This is on initial inspection. She has not really had any discomfort but honestly she really was not experiencing [name] lot of pain even before. There is no evidence of infection or fluctuation under the callous which is present at this point. ADMISSION 11/12/2019 This is [name] 73 year old woman that we previously had in this clinic in 2017 for two visits with [name] wound I think roughly in the same area. At that point she had [name] bunion in the left. She has apparently had surgery on this since then. Looking through care everywhere we can see that she has had follow-up with podiatry from 06/12/2019 through 10/28/2019 for [name] wound on the plantar aspect of her left first MTP. She states when this started she was having severe  knee pain on the right which forced her to alter her gait. She has recently received doxycycline which she is finished and Bactroban. She is back to using Medihoney on the wound. She is offloading with the Pegasys [name]. Past medical history includes type 2 diabetes with [name] recent hemoglobin A1c of 6.8, severe type II diabetic neuropathy, history of [name] CVA, stage III chronic renal failure hypertension, depression, of bilateral previous bunion surgery, right first toe amputation and [name] partial left third toe amputation for osteomyelitis. The patient states that she recently had work to do in her mother's house in [place] and so she was on her feet quite [name] lot which delayed the healing. ABI in our clinic was 1.1 on the left 8/31; plantar aspect of her left first metatarsal head. Using silver collagen. This is probably [name] surgical wound 9/14; this is [name] patient who had [name] wound on the plantar aspect of her left first metatarsal head in the setting of [name] significant bunion deformity. We use cervical collagen and [name] offloading [name]. She has significant diabetic neuropathy probably some degree of [name] Charcot deformity in her feet. This is closed over today. Readmission 03/30/2021 Ms. Delisia Mcquiston is [name] 72 year old female with [name] past medical history of insulin-dependent controlled type 2 diabetes s/p right great toe amputation, right knee osteoarthritis and hypertension that presents to the clinic for [name] second right toe wound. She states she was getting out of the bathtub when her foot got caught the tub and the skin to her second toe was split open. She noticed this after the bath when she saw blood on the floor. She has neuropathy in her feet. She visited the ED following the event on 03/26/2021. They obtained x-rays that showed [name] fracture to the fifth toe but no fracture to the second toe. She was started on doxycycline. She currently denies signs of infection. She has  been keeping the area covered with [name]  Band-Aid. Readmission 10/21/2021 Ms. Kenise Covey is [name] 72 year old female with [name] past medical history of insulin dependent controlled type 2 diabetes complicated by peripheral neuropathy that presents to the clinic for [name] left met head wound. She has been following with Dr. Earleen Newport, podiatry for over [name] year for this issue. She has tried collagen, mupirocin ointment and Medihoney. She is at high fall risk and cannot tolerate [name] total contact cast. She has orthotics with specialized inserts for her current issue. She currently denies signs of infection. 8/10; patient presents for follow-up. Unfortunately Regranex was unaffordable at $1200. She has been using Medihoney to the wound bed. She reports aggressively offloading the area. She has offloading inserts. She denies signs of infection. 8/22; patient presents for follow-up. She has been using Medihoney to the wound bed with improvement in wound healing. She has no issues or complaints today. She will be out of town starting next week for the next 2 weeks. AURIELLE, [place] [name] ([id number]) [id number] Page 3 of 11 9/11; patient presents for follow-up. She has been using Medihoney to the wound bed. She went on vacation and was last seen 3 weeks ago. She states she had tried to aggressively offload the wound bed. 9/26; patient presents for follow-up. She has been using Medihoney to the wound bed. She has no issues or complaints today. 10/13; patient presents for follow-up. She has been using Medihoney to the wound bed. She has no issues or complaints today. We rediscussed the possibility of [name] total contact cast but she does not feel comfortable as she is [name] high fall risk and lives alone. 10/26; patient presents for follow-up. Has been using Medihoney to the wound bed. She has been approved for Grafix however has [name] 20% co-pay. Patient declined proceeding with this due to financial cost. Patient has no issues or complaints  today. 11/16; patient presents for follow-up. She has been using Medihoney to the wound bed. She has been using her diabetic shoes with an offloading foam donut. She declines total contact cast placement today. 11/30; patient presents for follow-up. She has been using Medihoney to the wound bed. She did not receive silver alginate. She has been using the Pegasys with surgical [name]. There has been improvement in wound healing. 12/8; patient presents for follow-up. She has been using Medihoney and silver alginate to the wound bed. She has been using the Pegasys with surgical [name] without issues. 12/18; patient presents for follow-up. She has been using Medihoney and silver alginate to the wound bed. She continues to use her Pegasys with [name] surgical [name]. She has no issues or complaints today. 12/28; patient presents for follow-up. She has been using Medihoney and silver alginate to the wound bed. She continues to use her peg assist with [name] surgical [name]. She states she tried to aggressively offload the wound bed this week. She has no issues or complaints today. 1/18; patient missed her last clinic appointment due to conjunctivitis. She has been using Medihoney and silver alginate to the wound bed. She continues to use her peg assist with her surgical [name]. 1/23; patient presents for follow-up. She has been using blast X and Hydrofera Blue to the wound bed. She uses her peg assist with her surgical [name]. She has no issues or complaints today. 1/30; patient presents for follow-up. She has been using blast X and Hydrofera Blue to the wound bed. She continues to use her peg assist with her surgical [name]. She  has no issues or complaints today. She has not received her blast X but still has ointment and the samples that were given to her. 2/6; patient presents for follow-up. She is been using blast X and Hydrofera Blue to the wound bed. She continues to use her Pegasys with her surgical [name] for offloading.  She has no issues or complaints today. Electronic Signature(s) Signed: 04/26/2022 3:36:48 PM By: Holly Shan DO Entered By: [name] on 04/26/2022 15:04:11 -------------------------------------------------------------------------------- Physical Exam Details Patient Name: Date of Service: [name], [name] RA H [name]. 04/26/2022 2:30 PM Medical Record Number: [id number] Patient Account Number: [id number] Date of Birth/Sex: Treating RN: 01-02-1951 (72 y.o. F) Primary Care Provider: Harlan [name] Other Clinician: Referring Provider: Treating Provider/Extender: [name] in Treatment: 26 Constitutional respirations regular, non-labored and within target range for patient.. Cardiovascular 2+ dorsalis pedis/posterior tibialis pulses. Psychiatric pleasant and cooperative. Notes Left foot: T the left first metatarsal there is an open wound with granulation tissue, nonviable tissue and callus. Postdebridement there is healthy granulation o tissue. No surrounding signs of infection. Electronic Signature(s) Signed: 04/26/2022 3:36:48 PM By: Holly Shan DO Entered By: [name] on 04/26/2022 15:04:33 Bayley, [place] [name] ([id number]) [id number] Page 4 of 11 -------------------------------------------------------------------------------- Physician Orders Details Patient Name: Date of Service: [name]. 04/26/2022 2:30 PM Medical Record Number: [id number] Patient Account Number: [id number] Date of Birth/Sex: Treating RN: 07/20/1950 (72 y.o. [name], [name] Primary Care Provider: Harlan [name] Other Clinician: Referring Provider: Treating Provider/Extender: [name] in Treatment: 18 Verbal / Phone Orders: No Diagnosis Coding ICD-10 Coding Code Description [phone] Non-pressure chronic ulcer of other part of left foot with fat layer exposed E11.621 Type 2 diabetes mellitus with foot  ulcer E11.42 Type 2 diabetes mellitus with diabetic polyneuropathy Follow-up Appointments ppointment in 1 week. - w/ Dr. Heber Layton Tuesday 130pm 05/03/2022 Return [name] Other: - Prism=Supplies Anesthetic (In clinic) Topical Lidocaine 4% applied to wound bed Cellular or Tissue Based Products Cellular or Tissue Based Product Type: - IVR for Grafix IVR for [employer]=$225 Copay Bathing/ Shower/ Hygiene May shower and wash wound with soap and water. Edema Control - Lymphedema / SCD / Other Avoid standing for long periods of time. Off-Loading Other: - Diabetic Shoes Relieve pressure/time standing of feet ***Use surgical [name] with peg assist insert with cutout for wound for offloading*** Additional Orders / Instructions Follow Nutritious Diet - Monitor/Control Blood Sugars Non Wound Condition Other Non Wound Condition Orders/Instructions: - Apply corn pad or gauze between great toe and 2nd toe. Wound Treatment Wound #4 - Metatarsal head first Wound Laterality: Plantar, Left Cleanser: Soap and Water 1 x Per Day/30 Days Discharge Instructions: May shower and wash wound with dial antibacterial soap and water prior to dressing change. Cleanser: Wound Cleanser (Generic) 1 x Per Day/30 Days Discharge Instructions: Cleanse the wound with wound cleanser prior to applying [name] clean dressing using gauze sponges, not tissue or cotton balls. Peri-Wound Care: Skin Prep (Generic) 1 x Per Day/30 Days Discharge Instructions: Use skin prep as directed Topical: BlastX 1 x Per Day/30 Days Prim Dressing: Hydrofera Blue Ready Transfer Foam, 2.5x2.5 (in/in) 1 x Per Day/30 Days ary Discharge Instructions: Apply directly to wound bed as directed Secondary Dressing: ALLEVYN Gentle Border, 3x3 (in/in) (Generic) 1 x Per Day/30 Days Discharge Instructions: Apply over primary dressing as directed. Secondary Dressing: Optifoam Non-Adhesive Dressing, 4x4 in 1 x Per Day/30 Days Discharge Instructions: Apply foam donut over  primary dressing as directed. Horsman,  Zav[place] [name] ([id number]) [id number] Page 5 of 11 Electronic Signature(s) Signed: 04/26/2022 3:36:48 PM By: Holly Shan DO Entered By: [name] on 04/26/2022 15:04:40 -------------------------------------------------------------------------------- Problem List Details Patient Name: Date of Service: [name], [name]. 04/26/2022 2:30 PM Medical Record Number: [id number] Patient Account Number: [id number] Date of Birth/Sex: Treating RN: Dec 31, 1950 (72 y.o. [name], Tammi Klippel Primary Care Provider: Harlan [name] Other Clinician: Referring Provider: Treating Provider/Extender: [name] in Treatment: 26 Active Problems ICD-10 Encounter Code Description Active Date MDM Diagnosis L97.522 Non-pressure chronic ulcer of other part of left foot with fat layer exposed 10/21/2021 No Yes E11.621 Type 2 diabetes mellitus with foot ulcer 10/21/2021 No Yes E11.42 Type 2 diabetes mellitus with diabetic polyneuropathy 10/21/2021 No Yes Inactive Problems Resolved Problems Electronic Signature(s) Signed: 04/26/2022 3:36:48 PM By: Holly Shan DO Entered By: [name] on 04/26/2022 14:55:14 -------------------------------------------------------------------------------- Progress Note Details Patient Name: Date of Service: [name], [name]. 04/26/2022 2:30 PM Medical Record Number: [id number] Patient Account Number: [id number] Date of Birth/Sex: Treating RN: [birth year] (72 y.o. F) Primary Care Provider: Harlan [name] Other Clinician: Referring Provider: Treating Provider/Extender: [name] in Treatment: 26 Subjective Chief Complaint Information obtained from Patient Left plantar ulcer 11/12/2019; patient is here referred by podiatry for review of [name] wound on the left first plantar metatarsal head 03/30/2021; right second toe wound s/p Trauma 10/21/2021; First left  metatarsal head wound History of Present Illness (HPI) 05/10/17 patient presents today for initial evaluation and our clinic concerning issues that she has been having with an ulceration on the plantar aspect of the LAIYLA, SLAGEL [name] ([id number]) [phone].pdf Page 6 of 11 left first metatarsal head. She has been seen [name] for this since August 2018. Subsequently he has recommended bunion surgery as that seems to be the culprit for why there is pressure and friction occurring at the site causing the callous and subsequently the wound. 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 With that being said I do have notes from several of his visits one of which does include an x-ray documentation stating that she had [name] negative x-ray. There was obviously the bunion the notes I have extended from 11 deformity. With that being said she has not had an MRI fortunately this wound does not probe to bone. The notes I have extended from February 08, 2017 through April 14, 2017. During that course she was also placed on antibiotics to help with an infection two weeks ago and this was doxycycline. Fortunately that seems to have completely resolved any infection issues that she may have had. Unfortunately this is an ulcer that she has actually been dealing with since April 2018. She just initially was trying to treat it and care for it on her own. Her most recent hemoglobin A1c was 5.8, she is [name] non-smoker, she does have [name] right first toe amputation and [name] left third toe amputation. Fortunately she's not having any discomfort at the site. 05/17/17 on evaluation today patient's ulcer on the plantar aspect of the first metatarsal region appears to be close at this point. There does not appear to be any evidence of ulcer opening. This is on  initial inspection. She has not really had any discomfort but honestly she really was not experiencing [name] lot of pain even before. There is  no evidence of infection or fluctuation under the callous which is present at this point. ADMISSION 11/12/2019 This is [name] 72 year old woman that we previously had in this clinic in 2017 for two visits with [name] wound I think roughly in the same area. At that point she had [name] bunion in the left. She has apparently had surgery on this since then. Looking through care everywhere we can see that she has had follow-up with podiatry from 06/12/2019 through 10/28/2019 for [name] wound on the plantar aspect of her left first MTP. She states when this started she was having severe knee pain on the right which forced her to alter her gait. She has recently received doxycycline which she is finished and Bactroban. She is back to using Medihoney on the wound. She is offloading with the Pegasys [name]. Past medical history includes type 2 diabetes with [name] recent hemoglobin A1c of 6.8, severe type II diabetic neuropathy, history of [name] CVA, stage III chronic renal failure hypertension, depression, of bilateral previous bunion surgery, right first toe amputation and [name] partial left third toe amputation for osteomyelitis. The patient states that she recently had work to do in her mother's house in [place] and so she was on her feet quite [name] lot which delayed the healing. ABI in our clinic was 1.1 on the left 8/31; plantar aspect of her left first metatarsal head. Using silver collagen. This is probably [name] surgical wound 9/14; this is [name] patient who had [name] wound on the plantar aspect of her left first metatarsal head in the setting of [name] significant bunion deformity. We use cervical collagen and [name] offloading [name]. She has significant diabetic neuropathy probably some degree of [name] Charcot deformity in her feet. This is closed over today. Readmission 03/30/2021 Ms. Tekoa Hamor is [name] 72 year old female  with [name] past medical history of insulin-dependent controlled type 2 diabetes s/p right great toe amputation, right knee osteoarthritis and hypertension that presents to the clinic for [name] second right toe wound. She states she was getting out of the bathtub when her foot got caught the tub and the skin to her second toe was split open. She noticed this after the bath when she saw blood on the floor. She has neuropathy in her feet. She visited the ED following the event on 03/26/2021. They obtained x-rays that showed [name] fracture to the fifth toe but no fracture to the second toe. She was started on doxycycline. She currently denies signs of infection. She has been keeping the area covered with [name] Band-Aid. Readmission 10/21/2021 Ms. Jhoselin Rossmann is [name] 72 year old female with [name] past medical history of insulin dependent controlled type 2 diabetes complicated by peripheral neuropathy that presents to the clinic for [name] left met head wound. She has been following with Dr. Earleen Newport, podiatry for over [name] year for this issue. She has tried collagen, mupirocin ointment and Medihoney. She is at high fall risk and cannot tolerate [name] total contact cast. She has orthotics with specialized inserts for her current issue. She currently denies signs of infection. 8/10; patient presents for follow-up. Unfortunately Regranex was unaffordable at $1200. She has been using Medihoney to the wound bed. She reports aggressively offloading the area. She has offloading inserts. She denies signs of infection. 8/22; patient presents for follow-up. She has been using Medihoney to the wound bed with improvement in wound healing. She has no issues or complaints today. She will be out of town starting next week for the next 2 weeks.  9/11; patient presents for follow-up. She has been using Medihoney to the wound bed. She went on vacation and was last seen 3 weeks ago. She states she had tried to aggressively offload the wound bed. 9/26; patient  presents for follow-up. She has been using Medihoney to the wound bed. She has no issues or complaints today. 10/13; patient presents for follow-up. She has been using Medihoney to the wound bed. She has no issues or complaints today. We rediscussed the possibility of [name] total contact cast but she does not feel comfortable as she is [name] high fall risk and lives alone. 10/26; patient presents for follow-up. Has been using Medihoney to the wound bed. She has been approved for Grafix however has [name] 20% co-pay. Patient declined proceeding with this due to financial cost. Patient has no issues or complaints today. 11/16; patient presents for follow-up. She has been using Medihoney to the wound bed. She has been using her diabetic shoes with an offloading foam donut. She declines total contact cast placement today. 11/30; patient presents for follow-up. She has been using Medihoney to the wound bed. She did not receive silver alginate. She has been using the Pegasys with surgical [name]. There has been improvement in wound healing. 12/8; patient presents for follow-up. She has been using Medihoney and silver alginate to the wound bed. She has been using the Pegasys with surgical [name] without issues. 12/18; patient presents for follow-up. She has been using Medihoney and silver alginate to the wound bed. She continues to use her Pegasys with [name] surgical [name]. She has no issues or complaints today. 12/28; patient presents for follow-up. She has been using Medihoney and silver alginate to the wound bed. She continues to use her peg assist with [name] surgical [name]. She states she tried to aggressively offload the wound bed this week. She has no issues or complaints today. 1/18; patient missed her last clinic appointment due to conjunctivitis. She has been using Medihoney and silver alginate to the wound bed. She continues to use her peg assist with her surgical [name]. 1/23; patient presents for follow-up. She has been  using blast X and Hydrofera Blue to the wound bed. She uses her peg assist with her surgical [name]. She has no issues or complaints today. 1/30; patient presents for follow-up. She has been using blast X and Hydrofera Blue to the wound bed. She continues to use her peg assist with her surgical [name]. She has no issues or complaints today. She has not received her blast X but still has ointment and the samples that were given to her. 2/6; patient presents for follow-up. She is been using blast X and Hydrofera Blue to the wound bed. She continues to use her Pegasys with her surgical [name] for offloading. She has no issues or complaints today. Patient History SENNA, LAPE [name] ([id number]) [id number] Page 7 of 11 Information obtained from Patient. Family History Cancer - Father, Hypertension - Mother,Father,Siblings, Stroke - Mother,Father, No family history of Diabetes, Heart Disease, Hereditary Spherocytosis, Kidney Disease, Lung Disease, Seizures, Thyroid Problems, Tuberculosis. Social History Never smoker, Marital Status - Single, Alcohol Use - Never, Drug Use - No History, Caffeine Use - Rarely. Medical History Eyes Patient has history of Cataracts Denies history of Glaucoma, Optic Neuritis Ear/Nose/Mouth/Throat Denies history of Chronic sinus problems/congestion, Middle ear problems Hematologic/Lymphatic Denies history of Anemia, Hemophilia, Human Immunodeficiency Virus, Lymphedema, Sickle Cell Disease Respiratory Patient has history of Sleep Apnea Denies history of Aspiration, Asthma, Chronic Obstructive Pulmonary Disease (COPD),  Pneumothorax, Tuberculosis Cardiovascular Patient has history of Hypertension Denies history of Angina, Arrhythmia, Congestive Heart Failure, Coronary Artery Disease, Deep Vein Thrombosis, Hypotension, Myocardial Infarction, Peripheral Arterial Disease, Peripheral Venous Disease, Phlebitis, Vasculitis Gastrointestinal Denies history  of Cirrhosis , Colitis, Crohnoos, Hepatitis [name], Hepatitis B, Hepatitis C Endocrine Patient has history of Type II Diabetes Denies history of Type I Diabetes Genitourinary Denies history of End Stage Renal Disease Immunological Denies history of Lupus Erythematosus, Raynaudoos, Scleroderma Integumentary (Skin) Denies history of History of Burn Musculoskeletal Patient has history of Osteoarthritis, Osteomyelitis - Hx in 2007 (left 3rd toe) Denies history of Gout, Rheumatoid Arthritis Neurologic Patient has history of Neuropathy Denies history of Dementia, Quadriplegia, Paraplegia, Seizure Disorder Oncologic Patient has history of Received Radiation - 03/2020 Denies history of Received Chemotherapy Psychiatric Patient has history of Confinement Anxiety Denies history of Anorexia/bulimia Hospitalization/Surgery History - Amputation of right great toe. - Tendon repair. - Hysterectomy. - Osteomyelitis left 3rd toe. - cervical fusion with cage. - left breast lumpectomy. Medical [name] Surgical History Notes nd Constitutional Symptoms (General Health) morbid obesity Cardiovascular Takotsubo cardiomyopathy 2012, hyperlipidemia, Hx CVA, Tachycardia Genitourinary CKD stage 3 Musculoskeletal left leg weakness Neurologic CVA Oncologic hx endometrial cancer, left breast CA Psychiatric depression Objective Constitutional respirations regular, non-labored and within target range for patient.. Vitals Time Taken: 2:29 PM, Temperature: 97.7 F, Pulse: 71 bpm, Respiratory Rate: 18 breaths/min, Blood Pressure: 147/75 mmHg. Cardiovascular 2+ dorsalis pedis/posterior tibialis pulses. Psychiatric pleasant and cooperative. AUTUMNE, KALLIO [name] ([id number]) [id number] Page 8 of 11 General Notes: Left foot: T the left first metatarsal there is an open wound with granulation tissue, nonviable tissue and callus. Postdebridement there is o healthy granulation tissue. No  surrounding signs of infection. Integumentary (Hair, Skin) Wound #4 status is Open. Original cause of wound was Gradually Appeared. The date acquired was: 10/02/2020. The wound has been in treatment 26 weeks. The wound is located on the Left,Plantar Metatarsal head first. The wound measures 0.5cm length x 0.4cm width x 0.2cm depth; 0.157cm^2 area and 0.031cm^3 volume. There is Fat Layer (Subcutaneous Tissue) exposed. There is no tunneling or undermining noted. There is [name] medium amount of serosanguineous drainage noted. The wound margin is distinct with the outline attached to the wound base. There is large (67-100%) pink, pale granulation within the wound bed. There is no necrotic tissue within the wound bed. The periwound skin appearance exhibited: Callus. The periwound skin appearance did not exhibit: Crepitus, Excoriation, Induration, Rash, Scarring, Dry/Scaly, Maceration, Atrophie Blanche, Cyanosis, Ecchymosis, Hemosiderin Staining, Mottled, Pallor, Rubor, Erythema. Periwound temperature was noted as No Abnormality. Assessment Active Problems ICD-10 Non-pressure chronic ulcer of other part of left foot with fat layer exposed Type 2 diabetes mellitus with foot ulcer Type 2 diabetes mellitus with diabetic polyneuropathy Patient's wound appears well-healing. I debrided nonviable tissue. I recommended continuing blast X and Hydrofera Blue. Continue aggressive offloading with surgical [name] and peg assist. Follow-up in 1 week. Procedures Wound #4 Pre-procedure diagnosis of Wound #4 is [name] Diabetic Wound/Ulcer of the Lower Extremity located on the Left,Plantar Metatarsal head first .Severity of Tissue Pre Debridement is: Fat layer exposed. There was [name] Excisional Skin/Subcutaneous Tissue Debridement with [name] total area of 4 sq cm performed by Holly Shan, DO. With the following instrument(s): Curette to remove Viable and Non-Viable tissue/material. Material removed includes Callus,  Subcutaneous Tissue, Slough, Skin: Dermis, and Skin: Epidermis after achieving pain control using Lidocaine 4% Topical Solution. [name] time out was conducted at 14:45, prior to the start of the procedure.  [name] Minimum amount of bleeding was controlled with Pressure. The procedure was tolerated well with [name] pain level of 0 throughout and [name] pain level of 0 following the procedure. Post Debridement Measurements: 0.5cm length x 0.4cm width x 0.2cm depth; 0.031cm^3 volume. Character of Wound/Ulcer Post Debridement is improved. Severity of Tissue Post Debridement is: Fat layer exposed. Post procedure Diagnosis Wound #4: Same as Pre-Procedure Plan Follow-up Appointments: Return Appointment in 1 week. - w/ Dr. He[place] Tuesday 130pm 05/03/2022 Other: - [facility]=Supplies Anesthetic: (In clinic) Topical Lidocaine 4% applied to wound bed Cellular or Tissue Based Products: Cellular or Tissue Based Product Type: - IVR for Grafix IVR for [employer]=$225 Copay Bathing/ Shower/ Hygiene: May shower and wash wound with soap and water. Edema Control - Lymphedema / SCD / Other: Avoid standing for long periods of time. Off-Loading: Other: - Diabetic Shoes Relieve pressure/time standing of feet ***Use surgical [name] with peg assist insert with cutout for wound for offloading*** Additional Orders / Instructions: Follow Nutritious Diet - Monitor/Control Blood Sugars Non Wound Condition: Other Non Wound Condition Orders/Instructions: - Apply corn pad or gauze between great toe and 2nd toe. WOUND #4: - Metatarsal head first Wound Laterality: Plantar, Left Cleanser: Soap and Water 1 x Per Day/30 Days Discharge Instructions: May shower and wash wound with dial antibacterial soap and water prior to dressing change. Cleanser: Wound Cleanser (Generic) 1 x Per Day/30 Days Discharge Instructions: Cleanse the wound with wound cleanser prior to applying [name] clean dressing using gauze sponges, not tissue or cotton balls. Peri-Wound  Care: Skin Prep (Generic) 1 x Per Day/30 Days Discharge Instructions: Use skin prep as directed Topical: BlastX 1 x Per Day/30 Days Prim Dressing: Hydrofera Blue Ready Transfer Foam, 2.5x2.5 (in/in) 1 x Per Day/30 Days ary Discharge Instructions: Apply directly to wound bed as directed Secondary Dressing: ALLEVYN Gentle Border, 3x3 (in/in) (Generic) 1 x Per Day/30 Days Discharge Instructions: Apply over primary dressing as directed. Secondary Dressing: Optifoam Non-Adhesive Dressing, 4x4 in 1 x Per Day/30 Days Discharge Instructions: Apply foam donut over primary dressing as directed. 1. In office sharp debridement CARELI, LUZADER [name] ([id number]) [id number].pdf Page 9 of 11 2. Plastics and Hydrofera Blue 3. Aggressive offloadingoosurgical [name] with peg assist Electronic Signature(s) Signed: 04/26/2022 3:36:48 PM By: Holly Shan DO Entered By: [name] on 04/26/2022 15:05:21 -------------------------------------------------------------------------------- HxROS Details Patient Name: Date of Service: [name], [name]. 04/26/2022 2:30 PM Medical Record Number: [id number] Patient Account Number: [id number] Date of Birth/Sex: Treating RN: 03-04-1951 (72 y.o. F) Primary Care Provider: Harlan [name] Other Clinician: Referring Provider: Treating Provider/Extender: [name] in Treatment: 26 Information Obtained From Patient Constitutional Symptoms (General Health) Medical History: Past Medical History Notes: morbid obesity Eyes Medical History: Positive for: Cataracts Negative for: Glaucoma; Optic Neuritis Ear/Nose/Mouth/Throat Medical History: Negative for: Chronic sinus problems/congestion; Middle ear problems Hematologic/Lymphatic Medical History: Negative for: Anemia; Hemophilia; Human Immunodeficiency Virus; Lymphedema; Sickle Cell Disease Respiratory Medical History: Positive for: Sleep Apnea Negative for:  Aspiration; Asthma; Chronic Obstructive Pulmonary Disease (COPD); Pneumothorax; Tuberculosis Cardiovascular Medical History: Positive for: Hypertension Negative for: Angina; Arrhythmia; Congestive Heart Failure; Coronary Artery Disease; Deep Vein Thrombosis; Hypotension; Myocardial Infarction; Peripheral Arterial Disease; Peripheral Venous Disease; Phlebitis; Vasculitis Past Medical History Notes: Takotsubo cardiomyopathy 2012, hyperlipidemia, Hx CVA, Tachycardia Gastrointestinal Medical History: Negative for: Cirrhosis ; Colitis; Crohns; Hepatitis [name]; Hepatitis B; Hepatitis C Endocrine Medical History: Positive for: Type II Diabetes Negative for: Type I Diabetes Time with diabetes: over 20 years Treated with: Insulin, Oral  agents Blood sugar tested every day: Yes Tested : daily [place], [place] [name] ([id number]) [phone].pdf Page 10 of 11 Genitourinary Medical History: Negative for: End Stage Renal Disease Past Medical History Notes: CKD stage 3 Immunological Medical History: Negative for: Lupus Erythematosus; Raynauds; Scleroderma Integumentary (Skin) Medical History: Negative for: History of Burn Musculoskeletal Medical History: Positive for: Osteoarthritis; Osteomyelitis - Hx in 2007 (left 3rd toe) Negative for: Gout; Rheumatoid Arthritis Past Medical History Notes: left leg weakness Neurologic Medical History: Positive for: Neuropathy Negative for: Dementia; Quadriplegia; Paraplegia; Seizure Disorder Past Medical History Notes: CVA Oncologic Medical History: Positive for: Received Radiation - 03/2020 Negative for: Received Chemotherapy Past Medical History Notes: hx endometrial cancer, left breast CA Psychiatric Medical History: Positive for: Confinement Anxiety Negative for: Anorexia/bulimia Past Medical History Notes: depression HBO Extended History Items Eyes: Cataracts Immunizations Pneumococcal Vaccine: Received Pneumococcal  Vaccination: Yes Received Pneumococcal Vaccination On or After 60th Birthday: Yes Implantable Devices None Hospitalization / Surgery History Type of Hospitalization/Surgery Amputation of right great toe Tendon repair Hysterectomy Osteomyelitis left 3rd toe cervical fusion with cage left breast lumpectomy Family and Social History Cancer: Yes - Father; Diabetes: No; Heart Disease: No; Hereditary Spherocytosis: No; Hypertension: Yes - Mother,Father,Siblings; Kidney Disease: No; Lung Disease: No; Seizures: No; Stroke: Yes - Mother,Father; Thyroid Problems: No; Tuberculosis: No; Never smoker; Marital Status - Single; Alcohol Use: Never; Drug Use: No History; Caffeine Use: Rarely; Financial Concerns: No; Food, Clothing or Shelter Needs: No; Support System Lacking: No; Transportation Concerns: No [place], [place] [name] ([id number]) [id number].pdf Page 11 of 11 Electronic Signature(s) Signed: 04/26/2022 3:36:48 PM By: Holly Shan DO Entered By: [name] on 04/26/2022 15:04:17 -------------------------------------------------------------------------------- SuperBill Details Patient Name: Date of Service: [name], [name]. 04/26/2022 Medical Record Number: [id number] Patient Account Number: [id number] Date of Birth/Sex: Treating RN: [birth year] (72 y.o. [name], [name] Primary Care Provider: Harlan [name] Other Clinician: Referring Provider: Treating Provider/Extender: [name] Weeks in Treatment: 26 Diagnosis Coding ICD-10 Codes Code Description [phone] Non-pressure chronic ulcer of other part of left foot with fat layer exposed E11.621 Type 2 diabetes mellitus with foot ulcer E11.42 Type 2 diabetes mellitus with diabetic polyneuropathy Facility Procedures : CPT4 Code: [id number] Description: 85631 - DEB SUBQ TISSUE 20 SQ CM/< ICD-10 Diagnosis Description L97.522 Non-pressure chronic ulcer of other part of left foot with fat layer  exposed E11.621 Type 2 diabetes mellitus with foot ulcer Modifier: Quantity: 1 Physician Procedures : CPT4 Code Description Modifier [id number] - WC PHYS SUBQ TISS 20 SQ CM ICD-10 Diagnosis Description L97.522 Non-pressure chronic ulcer of other part of left foot with fat layer exposed E11.621 Type 2 diabetes mellitus with foot ulcer Quantity: 1 Electronic Signature(s) Signed: 04/26/2022 3:36:48 PM By: Holly Shan DO Entered By: [name] on 04/26/2022 15:05:29

## 2022-05-03 ENCOUNTER — Encounter (HOSPITAL_BASED_OUTPATIENT_CLINIC_OR_DEPARTMENT_OTHER): Payer: PPO | Admitting: Internal Medicine

## 2022-05-09 ENCOUNTER — Ambulatory Visit (HOSPITAL_BASED_OUTPATIENT_CLINIC_OR_DEPARTMENT_OTHER): Payer: PPO | Admitting: Internal Medicine

## 2022-05-11 DIAGNOSIS — I129 Hypertensive chronic kidney disease with stage 1 through stage 4 chronic kidney disease, or unspecified chronic kidney disease: Secondary | ICD-10-CM | POA: Diagnosis not present

## 2022-05-11 DIAGNOSIS — E785 Hyperlipidemia, unspecified: Secondary | ICD-10-CM | POA: Diagnosis not present

## 2022-05-11 DIAGNOSIS — E1121 Type 2 diabetes mellitus with diabetic nephropathy: Secondary | ICD-10-CM | POA: Diagnosis not present

## 2022-05-11 DIAGNOSIS — F3341 Major depressive disorder, recurrent, in partial remission: Secondary | ICD-10-CM | POA: Diagnosis not present

## 2022-05-11 DIAGNOSIS — N183 Chronic kidney disease, stage 3 unspecified: Secondary | ICD-10-CM | POA: Diagnosis not present

## 2022-05-24 ENCOUNTER — Encounter (HOSPITAL_BASED_OUTPATIENT_CLINIC_OR_DEPARTMENT_OTHER): Payer: PPO | Attending: Internal Medicine | Admitting: Internal Medicine

## 2022-05-24 DIAGNOSIS — F32A Depression, unspecified: Secondary | ICD-10-CM | POA: Insufficient documentation

## 2022-05-24 DIAGNOSIS — Z89411 Acquired absence of right great toe: Secondary | ICD-10-CM | POA: Insufficient documentation

## 2022-05-24 DIAGNOSIS — N183 Chronic kidney disease, stage 3 unspecified: Secondary | ICD-10-CM | POA: Insufficient documentation

## 2022-05-24 DIAGNOSIS — E11621 Type 2 diabetes mellitus with foot ulcer: Secondary | ICD-10-CM | POA: Diagnosis not present

## 2022-05-24 DIAGNOSIS — E1122 Type 2 diabetes mellitus with diabetic chronic kidney disease: Secondary | ICD-10-CM | POA: Insufficient documentation

## 2022-05-24 DIAGNOSIS — L97522 Non-pressure chronic ulcer of other part of left foot with fat layer exposed: Secondary | ICD-10-CM

## 2022-05-24 DIAGNOSIS — I129 Hypertensive chronic kidney disease with stage 1 through stage 4 chronic kidney disease, or unspecified chronic kidney disease: Secondary | ICD-10-CM | POA: Diagnosis not present

## 2022-05-24 DIAGNOSIS — Z89422 Acquired absence of other left toe(s): Secondary | ICD-10-CM | POA: Diagnosis not present

## 2022-05-24 DIAGNOSIS — Z8673 Personal history of transient ischemic attack (TIA), and cerebral infarction without residual deficits: Secondary | ICD-10-CM | POA: Diagnosis not present

## 2022-05-24 DIAGNOSIS — E1142 Type 2 diabetes mellitus with diabetic polyneuropathy: Secondary | ICD-10-CM | POA: Diagnosis not present

## 2022-05-27 DIAGNOSIS — Z89411 Acquired absence of right great toe: Secondary | ICD-10-CM | POA: Diagnosis not present

## 2022-05-27 DIAGNOSIS — Z89421 Acquired absence of other right toe(s): Secondary | ICD-10-CM | POA: Diagnosis not present

## 2022-05-27 DIAGNOSIS — I700 Atherosclerosis of aorta: Secondary | ICD-10-CM | POA: Diagnosis not present

## 2022-05-27 DIAGNOSIS — F3341 Major depressive disorder, recurrent, in partial remission: Secondary | ICD-10-CM | POA: Diagnosis not present

## 2022-05-27 DIAGNOSIS — Z794 Long term (current) use of insulin: Secondary | ICD-10-CM | POA: Diagnosis not present

## 2022-05-27 DIAGNOSIS — C50912 Malignant neoplasm of unspecified site of left female breast: Secondary | ICD-10-CM | POA: Diagnosis not present

## 2022-05-27 DIAGNOSIS — N183 Chronic kidney disease, stage 3 unspecified: Secondary | ICD-10-CM | POA: Diagnosis not present

## 2022-05-27 DIAGNOSIS — Z0000 Encounter for general adult medical examination without abnormal findings: Secondary | ICD-10-CM | POA: Diagnosis not present

## 2022-05-27 DIAGNOSIS — E785 Hyperlipidemia, unspecified: Secondary | ICD-10-CM | POA: Diagnosis not present

## 2022-05-27 DIAGNOSIS — I129 Hypertensive chronic kidney disease with stage 1 through stage 4 chronic kidney disease, or unspecified chronic kidney disease: Secondary | ICD-10-CM | POA: Diagnosis not present

## 2022-05-27 DIAGNOSIS — E1122 Type 2 diabetes mellitus with diabetic chronic kidney disease: Secondary | ICD-10-CM | POA: Diagnosis not present

## 2022-05-28 NOTE — Progress Notes (Signed)
Holly, [name] [name] ([id number]) [id number] Page 1 of 11 Visit Report for 05/24/2022 Chief Complaint Document Details Patient Name: Date of Service: [name] 05/24/2022 1:15 PM Medical Record Number: [id number] Patient Account Number: [id number] Date of Birth/Sex: Treating RN: 08-13-1950 (72 y.o. F) Primary Care Provider: [name] Other Clinician: Referring Provider: Treating Provider/Extender: [name] in Treatment: 52 Information Obtained from: Patient Chief Complaint Left plantar ulcer 11/12/2019; patient is here referred by podiatry for review of [name] wound on the left first plantar metatarsal head 03/30/2021; right second toe wound s/p Trauma 10/21/2021; First left metatarsal head wound Electronic Signature(s) Signed: 05/24/2022 2:52:18 PM By: [name] DO Entered By: [name] on 05/24/2022 14:11:46 -------------------------------------------------------------------------------- Debridement Details Patient Name: Date of Service: [name], Holly RA H [name]. 05/24/2022 1:15 PM Medical Record Number: [id number] Patient Account Number: [id number] Date of Birth/Sex: Treating RN: [birth year] (72 y.o. Tonita [name], Holly Primary Care Provider: [name] Other Clinician: Referring Provider: Treating Provider/Extender: [name] Weeks in Treatment: 30 Debridement Performed for Assessment: Wound #4 Left,Plantar Metatarsal head first Performed By: Physician [name], DO Debridement Type: Debridement Severity of Tissue Pre Debridement: Fat layer exposed Level of Consciousness (Pre-procedure): Awake and Alert Pre-procedure Verification/Time Out Yes - 13:58 Taken: Start Time: 13:58 Pain Control: Lidocaine T Area Debrided (L x W): otal 0.4 (cm) x 0.3 (cm) = 0.12 (cm) Tissue and other material debrided: Viable, Non-Viable, Callus, Slough, Subcutaneous, Slough Level: Skin/Subcutaneous  Tissue Debridement Description: Excisional Instrument: Curette Bleeding: Minimum Hemostasis Achieved: Pressure End Time: 13:58 Procedural Pain: 0 Post Procedural Pain: 0 Response to Treatment: Procedure was tolerated well Level of Consciousness (Post- Awake and Alert procedure): Post Debridement Measurements of Total Wound Length: (cm) 0.4 Width: (cm) 0.3 Depth: (cm) 0.3 Volume: (cm) 0.028 Character of Wound/Ulcer Post Debridement: Improved Severity of Tissue Post Debridement: Fat layer exposed Post Procedure Diagnosis Same as Pre-procedure Electronic Signature(s) Signed: 05/24/2022 2:52:18 PM By: Ulice Dash, [place] [name] ([id number] Page 2 of 11 Signed: 05/26/2022 4:33:02 PM By: Rhae Hammock RN Entered By: Rhae Hammock on 05/24/2022 13:58:47 -------------------------------------------------------------------------------- HPI Details Patient Name: Date of Service: [name], Holly RA H [name]. 05/24/2022 1:15 PM Medical Record Number: [id number] Patient Account Number: [id number] Date of Birth/Sex: Treating RN: 16-Jul-1950 (72 y.o. F) Primary Care Provider: [name] Other Clinician: Referring Provider: Treating Provider/Extender: [name] in Treatment: 30 History of Present Illness HPI Description: 05/10/17 patient presents today for initial evaluation and our clinic concerning issues that she has been having with an ulceration on the plantar aspect of the left first metatarsal head. She has been seen [name] for this since August 2018. Subsequently he has recommended bunion surgery as that seems to be the culprit for why there is pressure and friction occurring at the site causing the callous and subsequently the wound. 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 With that being said I do have notes from several of his visits one of which does include an x-ray documentation stating that she had [name] negative x-ray. There was obviously the bunion the notes I have extended from 11 deformity. With that being said she has not had an MRI fortunately this wound does not probe to bone. The notes I have extended from February 08, 2017 through April 14, 2017. During that course she was also placed on antibiotics to help with an infection two weeks ago and this was doxycycline. Fortunately that seems to have completely resolved any infection issues that she may have had. Unfortunately this is an ulcer that she has actually been dealing with since April 2018. She just initially was trying to treat it and care for it on her own. Her most recent hemoglobin A1c was 5.8, she is [name] non-smoker, she does have [name] right first toe amputation and [name] left third toe amputation. Fortunately she's not having any discomfort at the site. 05/17/17 on evaluation today patient's ulcer on the plantar aspect of the first metatarsal region appears to be close at this point. There does not appear to be any evidence of ulcer opening. This is on initial inspection. She has not really had any discomfort but honestly she really was not experiencing [name] lot of pain even before. There is no evidence of infection or fluctuation under the callous which is present at this point. ADMISSION 11/12/2019 This is [name] 72 year old woman that we previously had in this clinic in 2017 for two visits with [name] wound I think roughly in the same area. At that point she had [name] bunion in the left. She has apparently had surgery on this since then. Looking through care everywhere we can see that she has had follow-up with podiatry from 06/12/2019 through 10/28/2019 for [name] wound on the plantar aspect of her left first MTP. She states when this started she was having severe knee pain on the right which  forced her to alter her gait. She has recently received doxycycline which she is finished and Bactroban. She is back to using Medihoney on the wound. She is offloading with the Pegasys shoe. Past medical history includes type 2 diabetes with [name] recent hemoglobin A1c of 6.8, severe type II diabetic neuropathy, history of [name] CVA, stage III chronic renal failure hypertension, depression, of bilateral previous bunion surgery, right first toe amputation and [name] partial left third toe amputation for osteomyelitis. The patient states that she recently had work to do in her mother's house in [place] and so she was on her feet quite [name] lot which delayed the healing. ABI in our clinic was 1.1 on the left 8/31; plantar aspect of her left first metatarsal head. Using silver collagen. This is probably [name] surgical wound 9/14; this is [name] patient who had [name] wound on the plantar aspect of her left first metatarsal head in the setting of [name] significant bunion deformity. We use cervical collagen and [name] offloading shoe. She has significant diabetic neuropathy probably some degree of [name] Charcot deformity in her feet. This is closed over today. Readmission 03/30/2021 Ms. Thembi Mcfarlain is [name] 72 year old female with [name] past medical history of insulin-dependent controlled type 2 diabetes s/p right great toe amputation, right knee osteoarthritis and hypertension that presents to the clinic for [name] second right toe wound. She states she was getting out of the bathtub when her foot got caught the tub and the skin to her second toe was split open. She noticed this after the bath when she saw blood on the floor. She has neuropathy in her feet. She visited the ED following the event on 03/26/2021. They obtained x-rays that showed [name] fracture to the fifth toe but no fracture to the second toe. She was started on doxycycline. She currently denies signs of infection. She has been keeping the area covered with [name] Band-Aid. Readmission 10/21/2021  Ms.  Aelyn Natter is [name] 72 year old female with [name] past medical history of insulin dependent controlled type 2 diabetes complicated by peripheral neuropathy that presents to the clinic for [name] left met head wound. She has been following with Dr. Earleen Newport, podiatry for over [name] year for this issue. She has tried collagen, mupirocin ointment and Medihoney. She is at high fall risk and cannot tolerate [name] total contact cast. She has orthotics with specialized inserts for her current issue. She currently denies signs of infection. 8/10; patient presents for follow-up. Unfortunately Regranex was unaffordable at $1200. She has been using Medihoney to the wound bed. She reports aggressively offloading the area. She has offloading inserts. She denies signs of infection. 8/22; patient presents for follow-up. She has been using Medihoney to the wound bed with improvement in wound healing. She has no issues or complaints today. She will be out of town starting next week for the next 2 weeks. 9/11; patient presents for follow-up. She has been using Medihoney to the wound bed. She went on vacation and was last seen 3 weeks ago. She states she had tried to aggressively offload the wound bed. 9/26; patient presents for follow-up. She has been using Medihoney to the wound bed. She has no issues or complaints today. 10/13; patient presents for follow-up. She has been using Medihoney to the wound bed. She has no issues or complaints today. We rediscussed the possibility of [name] total contact cast but she does not feel comfortable as she is [name] high fall risk and lives alone. 10/26; patient presents for follow-up. Has been using Medihoney to the wound bed. She has been approved for Grafix however has [name] 20% co-pay. Patient declined proceeding with this due to financial cost. Patient has no issues or complaints today. 11/16; patient presents for follow-up. She has been using Medihoney to the wound bed. She has been using her diabetic shoes  with an offloading foam donut. She declines total contact cast placement today. 11/30; patient presents for follow-up. She has been using Medihoney to the wound bed. She did not receive silver alginate. She has been using the Pegasys with surgical shoe. There has been improvement in wound healing. ELPIDIA, GRAVELINE [name] ([id number]) [id number] Page 3 of 11 12/8; patient presents for follow-up. She has been using Medihoney and silver alginate to the wound bed. She has been using the Pegasys with surgical shoe without issues. 12/18; patient presents for follow-up. She has been using Medihoney and silver alginate to the wound bed. She continues to use her Pegasys with [name] surgical shoe. She has no issues or complaints today. 12/28; patient presents for follow-up. She has been using Medihoney and silver alginate to the wound bed. She continues to use her peg assist with [name] surgical shoe. She states she tried to aggressively offload the wound bed this week. She has no issues or complaints today. 1/18; patient missed her last clinic appointment due to conjunctivitis. She has been using Medihoney and silver alginate to the wound bed. She continues to use her peg assist with her surgical shoe. 1/23; patient presents for follow-up. She has been using blast X and Hydrofera Blue to the wound bed. She uses her peg assist with her surgical shoe. She has no issues or complaints today. 1/30; patient presents for follow-up. She has been using blast X and Hydrofera Blue to the wound bed. She continues to use her peg assist with her surgical shoe. She has no issues or complaints today. She has not received  her blast X but still has ointment and the samples that were given to her. 2/6; patient presents for follow-up. She is been using blast X and Hydrofera Blue to the wound bed. She continues to use her Pegasys with her surgical shoe for offloading. She has no issues or complaints today. 3/5;  patient missed her last clinic appointment. It has been 1 month since last seen the patient. She has been using blast X and Hydrofera Blue to the wound bed. She continues with her peg assist and surgical shoe for offloading. She denies signs of infection. Electronic Signature(s) Signed: 05/24/2022 2:52:18 PM By: [name] DO Entered By: [name] on 05/24/2022 14:12:16 -------------------------------------------------------------------------------- Physical Exam Details Patient Name: Date of Service: [name]. 05/24/2022 1:15 PM Medical Record Number: [id number] Patient Account Number: [id number] Date of Birth/Sex: Treating RN: 18-Mar-1951 (72 y.o. F) Primary Care Provider: [name] Other Clinician: Referring Provider: Treating Provider/Extender: [name] Weeks in Treatment: 30 Constitutional respirations regular, non-labored and within target range for patient.. Cardiovascular 2+ dorsalis pedis/posterior tibialis pulses. Psychiatric pleasant and cooperative. Notes Left foot: T the left first metatarsal there is an open wound with granulation tissue, nonviable tissue and callus. Postdebridement there is healthy granulation o tissue. No surrounding signs of infection. Electronic Signature(s) Signed: 05/24/2022 2:52:18 PM By: [name] DO Entered By: [name] on 05/24/2022 14:13:05 -------------------------------------------------------------------------------- Physician Orders Details Patient Name: Date of Service: [name], Holly RA H [name]. 05/24/2022 1:15 PM Medical Record Number: [id number] Patient Account Number: [id number] Date of Birth/Sex: Treating RN: 1950-07-16 (72 y.o. Benjaman Lobe Primary Care Provider: [name] Other Clinician: Referring Provider: Treating Provider/Extender: [name] in Treatment: 30 Verbal / Phone Orders: No Diagnosis Coding Follow-up  Appointments ppointment in 1 week. - w/ Dr. Heber [place] and bobbi Rm # 7 Tuesday 05/31/22 @ 12:30 Return [name] Other: - Prism=Supplies Run IVR for [facility] JENNESSA, PARMA [name] ([id number]) [phone].pdf Page 4 of 11 Anesthetic (In clinic) Topical Lidocaine 4% applied to wound bed Cellular or Tissue Based Products Cellular or Tissue Based Product Type: - IVR for Grafix IVR for [employer]=$225 Copay Bathing/ Shower/ Hygiene May shower and wash wound with soap and water. Negative Presssure Wound Therapy Wound Vac to wound continuously at 196m/hg pressure - Run IVR for SNAP VAC Edema Control - Lymphedema / SCD / Other Avoid standing for long periods of time. Off-Loading Other: - Diabetic Shoes Relieve pressure/time standing of feet ***Use surgical shoe with peg assist insert with cutout for wound for offloading*** Additional Orders / Instructions Follow Nutritious Diet - Monitor/Control Blood Sugars Non Wound Condition Other Non Wound Condition Orders/Instructions: - Apply corn pad or gauze between great toe and 2nd toe. Wound Treatment Wound #4 - Metatarsal head first Wound Laterality: Plantar, Left Cleanser: Soap and Water 1 x Per Day/30 Days Discharge Instructions: May shower and wash wound with dial antibacterial soap and water prior to dressing change. Cleanser: Wound Cleanser (Generic) 1 x Per Day/30 Days Discharge Instructions: Cleanse the wound with wound cleanser prior to applying [name] clean dressing using gauze sponges, not tissue or cotton balls. Peri-Wound Care: Skin Prep (Generic) 1 x Per Day/30 Days Discharge Instructions: Use skin prep as directed Topical: BlastX 1 x Per Day/30 Days Prim Dressing: Hydrofera Blue Ready Transfer Foam, 2.5x2.5 (in/in) 1 x Per Day/30 Days ary Discharge Instructions: Apply directly to wound bed as directed Secondary Dressing: ALLEVYN Gentle Border, 3x3 (in/in) (Generic) 1 x Per Day/30 Days Discharge  Instructions: Apply over  primary dressing as directed. Secondary Dressing: Optifoam Non-Adhesive Dressing, 4x4 in 1 x Per Day/30 Days Discharge Instructions: Apply foam donut over primary dressing as directed. Electronic Signature(s) Signed: 05/24/2022 2:52:18 PM By: [name] DO Entered By: [name] on 05/24/2022 14:13:14 -------------------------------------------------------------------------------- Problem List Details Patient Name: Date of Service: [name], Holly RA H [name]. 05/24/2022 1:15 PM Medical Record Number: [id number] Patient Account Number: [id number] Date of Birth/Sex: Treating RN: [birth year] (72 y.o. F) Primary Care Provider: [name] Other Clinician: Referring Provider: Treating Provider/Extender: [name] in Treatment: 30 Active Problems ICD-10 Encounter Code Description Active Date MDM Diagnosis L97.522 Non-pressure chronic ulcer of other part of left foot with fat layer exposed 10/21/2021 No Yes Julia, [place] [name] ([id number]) [id number] Page 5 of 11 E11.621 Type 2 diabetes mellitus with foot ulcer 10/21/2021 No Yes E11.42 Type 2 diabetes mellitus with diabetic polyneuropathy 10/21/2021 No Yes Inactive Problems Resolved Problems Electronic Signature(s) Signed: 05/24/2022 2:52:18 PM By: [name] DO Entered By: [name] on 05/24/2022 14:11:31 -------------------------------------------------------------------------------- Progress Note Details Patient Name: Date of Service: [name], Holly RA H [name]. 05/24/2022 1:15 PM Medical Record Number: [id number] Patient Account Number: [id number] Date of Birth/Sex: Treating RN: Jul 25, 1950 (72 y.o. F) Primary Care Provider: [name] Other Clinician: Referring Provider: Treating Provider/Extender: [name] in Treatment: 38 Subjective Chief Complaint Information obtained from Patient Left plantar ulcer 11/12/2019; patient is here referred by  podiatry for review of [name] wound on the left first plantar metatarsal head 03/30/2021; right second toe wound s/p Trauma 10/21/2021; First left metatarsal head wound History of Present Illness (HPI) 05/10/17 patient presents today for initial evaluation and our clinic concerning issues that she has been having with an ulceration on the plantar aspect of the left first metatarsal head. She has been seen [name] for this since August 2018. Subsequently he has recommended bunion surgery as that seems to be the culprit for why there is pressure and friction occurring at the site causing the callous and subsequently the wound. 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 With that being said I do have notes from several of his visits one of which does include an x-ray documentation stating that she had [name] negative x-ray. There was obviously the bunion the notes I have extended from 11 deformity. With that being said she has not had an MRI fortunately this wound does not probe to bone. The notes I have extended from February 08, 2017 through April 14, 2017. During that course she was also placed on antibiotics to help with an infection two weeks ago and this was doxycycline. Fortunately that seems to have completely resolved any infection issues that she may have had. Unfortunately this is an ulcer that she has actually been dealing with since April 2018. She just initially was trying to treat it and care for it on her own. Her most recent hemoglobin A1c was 5.8, she is [name] non-smoker, she does have [name] right first toe amputation and [name] left third toe amputation. Fortunately she's not having any discomfort at the site. 05/17/17 on evaluation today patient's ulcer on the plantar aspect of the first metatarsal region appears to be close at this point. There does not  appear to be any evidence of ulcer opening. This is on initial inspection. She has  not really had any discomfort but honestly she really was not experiencing [name] lot of pain even before. There is no evidence of infection or fluctuation under the callous which is present at this point. ADMISSION 11/12/2019 This is [name] 72 year old woman that we previously had in this clinic in 2017 for two visits with [name] wound I think roughly in the same area. At that point she had [name] bunion in the left. She has apparently had surgery on this since then. Looking through care everywhere we can see that she has had follow-up with podiatry from 06/12/2019 through 10/28/2019 for [name] wound on the plantar aspect of her left first MTP. She states when this started she was having severe knee pain on the right which forced her to alter her gait. She has recently received doxycycline which she is finished and Bactroban. She is back to using Medihoney on the wound. She is offloading with the Pegasys shoe. Past medical history includes type 2 diabetes with [name] recent hemoglobin A1c of 6.8, severe type II diabetic neuropathy, history of [name] CVA, stage III chronic renal failure hypertension, depression, of bilateral previous bunion surgery, right first toe amputation and [name] partial left third toe amputation for osteomyelitis. The patient states that she recently had work to do in her mother's house in [place] and so she was on her feet quite [name] lot which delayed the healing. ABI in our clinic was 1.1 on the left 8/31; plantar aspect of her left first metatarsal head. Using silver collagen. This is probably [name] surgical wound 9/14; this is [name] patient who had [name] wound on the plantar aspect of her left first metatarsal head in the setting of [name] significant bunion deformity. We use cervical collagen and [name] offloading shoe. She has significant diabetic neuropathy probably some degree of [name] Charcot deformity in her feet. This is closed over today. Readmission  03/30/2021 Ms. Helem Kwasnik is [name] 72 year old female with [name] past medical history of insulin-dependent controlled type 2 diabetes s/p right great toe amputation, right knee osteoarthritis and hypertension that presents to the clinic for [name] second right toe wound. She states she was getting out of the bathtub when her foot got caught the tub and the skin to her second toe was split open. She noticed this after the bath when she saw blood on the floor. She has neuropathy in her feet. She visited the ED following the event on 03/26/2021. They obtained x-rays that showed [name] fracture to the fifth toe but no fracture to the second toe. She was started on doxycycline. She currently denies signs of infection. She has been keeping the area covered with [name] Band-Aid. Readmission 10/21/2021 Ms. Arbell Alldredge is [name] 72 year old female with [name] past medical history of insulin dependent controlled type 2 diabetes complicated by peripheral neuropathy Franklyn, [place] [name] ([id number]) [phone].pdf Page 6 of 11 that presents to the clinic for [name] left met head wound. She has been following with Dr. Earleen Newport, podiatry for over [name] year for this issue. She has tried collagen, mupirocin ointment and Medihoney. She is at high fall risk and cannot tolerate [name] total contact cast. She has orthotics with specialized inserts for her current issue. She currently denies signs of infection. 8/10; patient presents for follow-up. Unfortunately Regranex was unaffordable at $1200. She has been using Medihoney to the wound bed. She reports aggressively offloading the area. She has offloading inserts. She denies signs of infection. 8/22; patient presents for follow-up. She has been using Medihoney to  the wound bed with improvement in wound healing. She has no issues or complaints today. She will be out of town starting next week for the next 2 weeks. 9/11; patient presents for follow-up. She has been using Medihoney to the wound bed.  She went on vacation and was last seen 3 weeks ago. She states she had tried to aggressively offload the wound bed. 9/26; patient presents for follow-up. She has been using Medihoney to the wound bed. She has no issues or complaints today. 10/13; patient presents for follow-up. She has been using Medihoney to the wound bed. She has no issues or complaints today. We rediscussed the possibility of [name] total contact cast but she does not feel comfortable as she is [name] high fall risk and lives alone. 10/26; patient presents for follow-up. Has been using Medihoney to the wound bed. She has been approved for Grafix however has [name] 20% co-pay. Patient declined proceeding with this due to financial cost. Patient has no issues or complaints today. 11/16; patient presents for follow-up. She has been using Medihoney to the wound bed. She has been using her diabetic shoes with an offloading foam donut. She declines total contact cast placement today. 11/30; patient presents for follow-up. She has been using Medihoney to the wound bed. She did not receive silver alginate. She has been using the Pegasys with surgical shoe. There has been improvement in wound healing. 12/8; patient presents for follow-up. She has been using Medihoney and silver alginate to the wound bed. She has been using the Pegasys with surgical shoe without issues. 12/18; patient presents for follow-up. She has been using Medihoney and silver alginate to the wound bed. She continues to use her Pegasys with [name] surgical shoe. She has no issues or complaints today. 12/28; patient presents for follow-up. She has been using Medihoney and silver alginate to the wound bed. She continues to use her peg assist with [name] surgical shoe. She states she tried to aggressively offload the wound bed this week. She has no issues or complaints today. 1/18; patient missed her last clinic appointment due to conjunctivitis. She has been using Medihoney and silver alginate  to the wound bed. She continues to use her peg assist with her surgical shoe. 1/23; patient presents for follow-up. She has been using blast X and Hydrofera Blue to the wound bed. She uses her peg assist with her surgical shoe. She has no issues or complaints today. 1/30; patient presents for follow-up. She has been using blast X and Hydrofera Blue to the wound bed. She continues to use her peg assist with her surgical shoe. She has no issues or complaints today. She has not received her blast X but still has ointment and the samples that were given to her. 2/6; patient presents for follow-up. She is been using blast X and Hydrofera Blue to the wound bed. She continues to use her Pegasys with her surgical shoe for offloading. She has no issues or complaints today. 3/5; patient missed her last clinic appointment. It has been 1 month since last seen the patient. She has been using blast X and Hydrofera Blue to the wound bed. She continues with her peg assist and surgical shoe for offloading. She denies signs of infection. Patient History Information obtained from Patient. Family History Cancer - Father, Hypertension - Mother,Father,Siblings, Stroke - Mother,Father, No family history of Diabetes, Heart Disease, Hereditary Spherocytosis, Kidney Disease, Lung Disease, Seizures, Thyroid Problems, Tuberculosis. Social History Never smoker, Marital Status - Single, Alcohol  Use - Never, Drug Use - No History, Caffeine Use - Rarely. Medical History Eyes Patient has history of Cataracts Denies history of Glaucoma, Optic Neuritis Ear/Nose/Mouth/Throat Denies history of Chronic sinus problems/congestion, Middle ear problems Hematologic/Lymphatic Denies history of Anemia, Hemophilia, Human Immunodeficiency Virus, Lymphedema, Sickle Cell Disease Respiratory Patient has history of Sleep Apnea Denies history of Aspiration, Asthma, Chronic Obstructive Pulmonary Disease (COPD), Pneumothorax,  Tuberculosis Cardiovascular Patient has history of Hypertension Denies history of Angina, Arrhythmia, Congestive Heart Failure, Coronary Artery Disease, Deep Vein Thrombosis, Hypotension, Myocardial Infarction, Peripheral Arterial Disease, Peripheral Venous Disease, Phlebitis, Vasculitis Gastrointestinal Denies history of Cirrhosis , Colitis, Crohnoos, Hepatitis [name], Hepatitis B, Hepatitis C Endocrine Patient has history of Type II Diabetes Denies history of Type I Diabetes Genitourinary Denies history of End Stage Renal Disease Immunological Denies history of Lupus Erythematosus, Raynaudoos, Scleroderma Integumentary (Skin) Denies history of History of Burn Musculoskeletal Patient has history of Osteoarthritis, Osteomyelitis - Hx in 2007 (left 3rd toe) Denies history of Gout, Rheumatoid Arthritis Neurologic [name], [name] ([id number] Page 7 of 11 Patient has history of Neuropathy Denies history of Dementia, Quadriplegia, Paraplegia, Seizure Disorder Oncologic Patient has history of Received Radiation - 03/2020 Denies history of Received Chemotherapy Psychiatric Patient has history of Confinement Anxiety Denies history of Anorexia/bulimia Hospitalization/Surgery History - Amputation of right great toe. - Tendon repair. - Hysterectomy. - Osteomyelitis left 3rd toe. - cervical fusion with cage. - left breast lumpectomy. Medical [name] Surgical History Notes nd Constitutional Symptoms (General Health) morbid obesity Cardiovascular Takotsubo cardiomyopathy 2012, hyperlipidemia, Hx CVA, Tachycardia Genitourinary CKD stage 3 Musculoskeletal left leg weakness Neurologic CVA Oncologic hx endometrial cancer, left breast CA Psychiatric depression Objective Constitutional respirations regular, non-labored and within target range for patient.. Vitals Time Taken: 1:36 PM, Temperature: 98.3 F, Pulse: 91 bpm, Respiratory Rate: 17 breaths/min,  Blood Pressure: 153/71 mmHg. Cardiovascular 2+ dorsalis pedis/posterior tibialis pulses. Psychiatric pleasant and cooperative. General Notes: Left foot: T the left first metatarsal there is an open wound with granulation tissue, nonviable tissue and callus. Postdebridement there is o healthy granulation tissue. No surrounding signs of infection. Integumentary (Hair, Skin) Wound #4 status is Open. Original cause of wound was Gradually Appeared. The date acquired was: 10/02/2020. The wound has been in treatment 30 weeks. The wound is located on the Left,Plantar Metatarsal head first. The wound measures 0.4cm length x 0.3cm width x 0.3cm depth; 0.094cm^2 area and 0.028cm^3 volume. There is Fat Layer (Subcutaneous Tissue) exposed. There is no tunneling noted, however, there is undermining starting at 12:00 and ending at 12:00 with [name] maximum distance of 0.3cm. There is [name] medium amount of serosanguineous drainage noted. The wound margin is distinct with the outline attached to the wound base. There is large (67-100%) pink, pale granulation within the wound bed. There is no necrotic tissue within the wound bed. The periwound skin appearance exhibited: Callus. The periwound skin appearance did not exhibit: Crepitus, Excoriation, Induration, Rash, Scarring, Dry/Scaly, Maceration, Atrophie Blanche, Cyanosis, Ecchymosis, Hemosiderin Staining, Mottled, Pallor, Rubor, Erythema. Periwound temperature was noted as No Abnormality. Assessment Active Problems ICD-10 Non-pressure chronic ulcer of other part of left foot with fat layer exposed Type 2 diabetes mellitus with foot ulcer Type 2 diabetes mellitus with diabetic polyneuropathy Patient's wound is stable. I debrided nonviable tissue. I recommended continuing with blast X and Hydrofera Blue. Continue aggressive offloading with surgical shoe and peg assist. She may benefit from [name] wound VAC. She is agreeable to this. We will see if her insurance will cover  snap VAC. Procedures Wound #4 ALIANAH, SCHELLIN [name] ([id number]) [id number] Page 8 of 11 Pre-procedure diagnosis of Wound #4 is [name] Diabetic Wound/Ulcer of the Lower Extremity located on the Left,Plantar Metatarsal head first .Severity of Tissue Pre Debridement is: Fat layer exposed. There was [name] Excisional Skin/Subcutaneous Tissue Debridement with [name] total area of 0.12 sq cm performed by [name], DO. With the following instrument(s): Curette to remove Viable and Non-Viable tissue/material. Material removed includes Callus, Subcutaneous Tissue, and Slough after achieving pain control using Lidocaine. No specimens were taken. [name] time out was conducted at 13:58, prior to the start of the procedure. [name] Minimum amount of bleeding was controlled with Pressure. The procedure was tolerated well with [name] pain level of 0 throughout and [name] pain level of 0 following the procedure. Post Debridement Measurements: 0.4cm length x 0.3cm width x 0.3cm depth; 0.028cm^3 volume. Character of Wound/Ulcer Post Debridement is improved. Severity of Tissue Post Debridement is: Fat layer exposed. Post procedure Diagnosis Wound #4: Same as Pre-Procedure Plan Follow-up Appointments: Return Appointment in 1 week. - w/ Dr. Heber Overland and bobbi Rm # 7 Tuesday 05/31/22 @ 12:30 Other: - Prism=Supplies Run IVR for SNAP VAC Anesthetic: (In clinic) Topical Lidocaine 4% applied to wound bed Cellular or Tissue Based Products: Cellular or Tissue Based Product Type: - IVR for Grafix IVR for [employer]=$225 Copay Bathing/ Shower/ Hygiene: May shower and wash wound with soap and water. Negative Presssure Wound Therapy: Wound Vac to wound continuously at 172m/hg pressure - Run IVR for SNAP VAC Edema Control - Lymphedema / SCD / Other: Avoid standing for long periods of time. Off-Loading: Other: - Diabetic Shoes Relieve pressure/time standing of feet ***Use surgical shoe with peg assist insert with cutout for  wound for offloading*** Additional Orders / Instructions: Follow Nutritious Diet - Monitor/Control Blood Sugars Non Wound Condition: Other Non Wound Condition Orders/Instructions: - Apply corn pad or gauze between great toe and 2nd toe. WOUND #4: - Metatarsal head first Wound Laterality: Plantar, Left Cleanser: Soap and Water 1 x Per Day/30 Days Discharge Instructions: May shower and wash wound with dial antibacterial soap and water prior to dressing change. Cleanser: Wound Cleanser (Generic) 1 x Per Day/30 Days Discharge Instructions: Cleanse the wound with wound cleanser prior to applying [name] clean dressing using gauze sponges, not tissue or cotton balls. Peri-Wound Care: Skin Prep (Generic) 1 x Per Day/30 Days Discharge Instructions: Use skin prep as directed Topical: BlastX 1 x Per Day/30 Days Prim Dressing: Hydrofera Blue Ready Transfer Foam, 2.5x2.5 (in/in) 1 x Per Day/30 Days ary Discharge Instructions: Apply directly to wound bed as directed Secondary Dressing: ALLEVYN Gentle Border, 3x3 (in/in) (Generic) 1 x Per Day/30 Days Discharge Instructions: Apply over primary dressing as directed. Secondary Dressing: Optifoam Non-Adhesive Dressing, 4x4 in 1 x Per Day/30 Days Discharge Instructions: Apply foam donut over primary dressing as directed. 1. In office sharp debridement 2. Blast X and Hydrofera Blue 3. Run IVR for snap VAC Electronic Signature(s) Signed: 05/24/2022 2:52:18 PM By: HKalman ShanDO Entered By: HKalman Shanon 05/24/2022 14:14:28 -------------------------------------------------------------------------------- HxROS Details Patient Name: Date of Service: [name] Holly RA H [name]. 05/24/2022 1:15 PM Medical Record Number: [id number] Account Number: [id number] of Birth/Sex: Treating RN: [id number](72y.o. F) Primary Care Provider: WHarlan StainsOther Clinician: Referring Provider: Treating Provider/Extender: HJake Churchin  Treatment: 30 Information Obtained From Patient Constitutional Symptoms ([facility] Medical History: Past Medical History Notes: morbid obesity GRUNSER [name] ([id number] Page 9  of 11 Eyes Medical History: Positive for: Cataracts Negative for: Glaucoma; Optic Neuritis Ear/Nose/Mouth/Throat Medical History: Negative for: Chronic sinus problems/congestion; Middle ear problems Hematologic/Lymphatic Medical History: Negative for: Anemia; Hemophilia; Human Immunodeficiency Virus; Lymphedema; Sickle Cell Disease Respiratory Medical History: Positive for: Sleep Apnea Negative for: Aspiration; Asthma; Chronic Obstructive Pulmonary Disease (COPD); Pneumothorax; Tuberculosis Cardiovascular Medical History: Positive for: Hypertension Negative for: Angina; Arrhythmia; Congestive Heart Failure; Coronary Artery Disease; Deep Vein Thrombosis; Hypotension; Myocardial Infarction; Peripheral Arterial Disease; Peripheral Venous Disease; Phlebitis; Vasculitis Past Medical History Notes: Takotsubo cardiomyopathy 2012, hyperlipidemia, Hx CVA, Tachycardia Gastrointestinal Medical History: Negative for: Cirrhosis ; Colitis; Crohns; Hepatitis [name]; Hepatitis B; Hepatitis C Endocrine Medical History: Positive for: Type II Diabetes Negative for: Type I Diabetes Time with diabetes: over 20 years Treated with: Insulin, Oral agents Blood sugar tested every day: Yes Tested : daily Genitourinary Medical History: Negative for: End Stage Renal Disease Past Medical History Notes: CKD stage 3 Immunological Medical History: Negative for: Lupus Erythematosus; Raynauds; Scleroderma Integumentary (Skin) Medical History: Negative for: History of Burn Musculoskeletal Medical History: Positive for: Osteoarthritis; Osteomyelitis - Hx in 2007 (left 3rd toe) Negative for: Gout; Rheumatoid Arthritis Past Medical History Notes: left leg  weakness Neurologic Medical History: Positive for: Neuropathy Negative for: Dementia; Quadriplegia; Paraplegia; Seizure Disorder Past Medical History Notes: CVA Oncologic Medical HistoryLILLAH, WITTMANN ([id number]) [id number] Page 10 of 11 Positive for: Received Radiation - 03/2020 Negative for: Received Chemotherapy Past Medical History Notes: hx endometrial cancer, left breast CA Psychiatric Medical History: Positive for: Confinement Anxiety Negative for: Anorexia/bulimia Past Medical History Notes: depression HBO Extended History Items Eyes: Cataracts Immunizations Pneumococcal Vaccine: Received Pneumococcal Vaccination: Yes Received Pneumococcal Vaccination On or After 60th Birthday: Yes Implantable Devices None Hospitalization / Surgery History Type of Hospitalization/Surgery Amputation of right great toe Tendon repair Hysterectomy Osteomyelitis left 3rd toe cervical fusion with cage left breast lumpectomy Family and Social History Cancer: Yes - Father; Diabetes: No; Heart Disease: No; Hereditary Spherocytosis: No; Hypertension: Yes - Mother,Father,Siblings; Kidney Disease: No; Lung Disease: No; Seizures: No; Stroke: Yes - Mother,Father; Thyroid Problems: No; Tuberculosis: No; Never smoker; Marital Status - Single; Alcohol Use: Never; Drug Use: No History; Caffeine Use: Rarely; Financial Concerns: No; Food, Clothing or Shelter Needs: No; Support System Lacking: No; Transportation Concerns: No Electronic Signature(s) Signed: 05/24/2022 2:52:18 PM By: [name] DO Entered By: [name] on 05/24/2022 14:12:23 -------------------------------------------------------------------------------- SuperBill Details Patient Name: Date of Service: [name], Holly RA H [name]. 05/24/2022 Medical Record Number: [id number] Patient Account Number: [id number] Date of Birth/Sex: Treating RN: 06-15-1950 (72 y.o. Tonita [name], Holly Primary Care  Provider: [name] Other Clinician: Referring Provider: Treating Provider/Extender: [name] Weeks in Treatment: 30 Diagnosis Coding ICD-10 Codes Code Description [phone] Non-pressure chronic ulcer of other part of left foot with fat layer exposed E11.621 Type 2 diabetes mellitus with foot ulcer E11.42 Type 2 diabetes mellitus with diabetic polyneuropathy Facility Procedures Physician Procedures : CPT4 Code Description Modifier [id number] - WC PHYS SUBQ TISS 20 SQ CM ICD-10 Diagnosis Description L97.522 Non-pressure chronic ulcer of other part of left foot with fat layer exposed E11.621 Type 2 diabetes mellitus with foot ulcer Quantity: 1 Electronic Signature(s) Signed: 05/24/2022 2:52:18 PM By: [name] DO Entered By: [name] on 05/24/2022 14:14:36

## 2022-05-28 NOTE — Progress Notes (Signed)
MAGALI, AMELIO [name] ([id number]) [id number] Page 1 of 7 Visit Report for 05/24/2022 Arrival Information Details Patient Name: Date of Service: [name] 05/24/2022 1:15 PM Medical Record Number: [id number] Patient Account Number: [id number] Date of Birth/Sex: Treating RN: [birth year] (72 y.o. Tonita Phoenix, [name] Primary Care Grady Mohabir: [name] Other Clinician: Referring Siani Utke: Treating Caidynce Muzyka/Extender: [name] in Treatment: 60 Visit Information History Since Last Visit Added or deleted any medications: No Patient Arrived: [name] Any new allergies or adverse reactions: No Arrival Time: 13:36 Had [name] fall or experienced change in No Accompanied By: self activities of daily living that may affect Transfer Assistance: Manual risk of falls: Patient Identification Verified: Yes Signs or symptoms of abuse/neglect since last visito No Secondary Verification Process Completed: Yes Hospitalized since last visit: No Patient Requires Transmission-Based Precautions: No Implantable device outside of the clinic excluding No Patient Has Alerts: Yes cellular tissue based products placed in the center Patient Alerts: ABI's R=1.07 L=1.02 since last visit: Has Dressing in Place as Prescribed: Yes Pain Present Now: No Electronic Signature(s) Signed: 05/26/2022 4:33:02 PM By: Rhae Hammock RN Entered By: Rhae Hammock on 05/24/2022 13:36:33 -------------------------------------------------------------------------------- Encounter Discharge Information Details Patient Name: Date of Service: [name], Holly RA H [name]. 05/24/2022 1:15 PM Medical Record Number: [id number] Patient Account Number: [id number] Date of Birth/Sex: Treating RN: [birth year] (73 y.o. Tonita Phoenix, [name] Primary Care Zuri Bradway: [name] Other Clinician: Referring Tawonna Esquer: Treating Damiano Stamper/Extender: [name] in Treatment:  30 Encounter Discharge Information Items Post Procedure Vitals Discharge Condition: Stable Temperature (F): 98.7 Ambulatory Status: Ambulatory Pulse (bpm): 74 Discharge Destination: Home Respiratory Rate (breaths/min): 17 Transportation: Private Auto Blood Pressure (mmHg): 120/80 Accompanied By: self Schedule Follow-up Appointment: Yes Clinical Summary of Care: Patient Declined Electronic Signature(s) Signed: 05/26/2022 4:33:02 PM By: Rhae Hammock RN Entered By: Rhae Hammock on 05/24/2022 14:10:33 -------------------------------------------------------------------------------- Lower Extremity Assessment Details Patient Name: Date of Service: [name], Holly Dess RA H [name]. 05/24/2022 1:15 PM Medical Record Number: [id number] Patient Account Number: [id number] Date of Birth/Sex: Treating RN: 10-Jul-1950 (72 y.o. Tonita Phoenix, [name] Primary Care Jakari Jacot: [name] Other Clinician: Referring Ceaira Ernster: Treating Berl Bonfanti/Extender: [name] Weeks in Treatment: 30 Edema Assessment Assessed: Shirlyn Goltz: Yes] Patrice Paradise: No] G[LeftKALECIA, [name] [id number] [[id number].pdf Page 2 of 7] Edema: [Left: N] [Right: o] Calf Left: Right: Point of Measurement: 34 cm From Medial Instep 47.4 cm Ankle Left: Right: Point of Measurement: 9 cm From Medial Instep 29.5 cm Vascular Assessment Pulses: Dorsalis Pedis Palpable: [Left:Yes] Posterior Tibial Palpable: [Left:Yes] Electronic Signature(s) Signed: 05/26/2022 4:33:02 PM By: Rhae Hammock RN Entered By: Rhae Hammock on 05/24/2022 13:39:57 -------------------------------------------------------------------------------- Multi Wound Chart Details Patient Name: Date of Service: [name], Holly RA H [name]. 05/24/2022 1:15 PM Medical Record Number: [id number] Patient Account Number: [id number] Date of Birth/Sex: Treating RN: Jul 08, 1950 (72 y.o. F) Primary Care Mita Vallo: [name] Other  Clinician: Referring Jung Yurchak: Treating Miklo Aken/Extender: [name] in Treatment: 30 Vital Signs Height(in): Pulse(bpm): 33 Weight(lbs): Blood Pressure(mmHg): 153/71 Body Mass Index(BMI): Temperature(F): 98.3 Respiratory Rate(breaths/min): 17 [4:Photos:] [N/[name]:N/[name]] Left, Plantar Metatarsal head first N/[name] N/[name] Wound Location: Gradually Appeared N/[name] N/[name] Wounding Event: Diabetic Wound/Ulcer of the Lower N/[name] N/[name] Primary Etiology: Extremity Cataracts, Sleep Apnea, Hypertension, N/[name] N/[name] Comorbid History: Type II Diabetes, Osteoarthritis, Osteomyelitis, Neuropathy, Received Radiation, Confinement Anxiety 10/02/2020 N/[name] N/[name] Date Acquired: 30 N/[name] N/[name] Weeks of Treatment: Open N/[name] N/[name] Wound Status: No N/[name] N/[name] Wound Recurrence: 0.4x0.3x0.3 N/[name] N/[name] Measurements L  x W x D (cm) 0.094 N/[name] N/[name] [name] (cm) : rea 0.028 N/[name] N/[name] Volume (cm) : 77.00% N/[name] N/[name] % Reduction in [name] rea: 77.20% N/[name] N/[name] % Reduction in Volume: 12 Starting Position 1 (o'clock): 12 Ending Position 1 (o'clock): 0.3 Maximum Distance 1 (cm): Yes N/[name] N/[name] Undermining: Grade 1 N/[name] N/[name] Classification: Medium N/[name] N/[name] Exudate [name] mountLADY, [name] ([id number] Page 3 of 7 Serosanguineous N/[name] N/[name] Exudate Type: red, brown N/[name] N/[name] Exudate Color: Distinct, outline attached N/[name] N/[name] Wound Margin: Large (67-100%) N/[name] N/[name] Granulation Amount: Pink, Pale N/[name] N/[name] Granulation Quality: None Present (0%) N/[name] N/[name] Necrotic Amount: Fat Layer (Subcutaneous Tissue): Yes N/[name] N/[name] Exposed Structures: Fascia: No Tendon: No Muscle: No Joint: No Bone: No Large (67-100%) N/[name] N/[name] Epithelialization: Debridement - Excisional N/[name] N/[name] Debridement: Pre-procedure Verification/Time Out 13:58 N/[name] N/[name] Taken: Lidocaine N/[name] N/[name] Pain Control: Callus, Subcutaneous, Slough N/[name] N/[name] Tissue Debrided: Skin/Subcutaneous Tissue N/[name] N/[name] Level: 0.12 N/[name] N/[name] Debridement  [name] (sq cm): rea Curette N/[name] N/[name] Instrument: Minimum N/[name] N/[name] Bleeding: Pressure N/[name] N/[name] Hemostasis [name] chieved: 0 N/[name] N/[name] Procedural Pain: 0 N/[name] N/[name] Post Procedural Pain: Procedure was tolerated well N/[name] N/[name] Debridement Treatment Response: 0.4x0.3x0.3 N/[name] N/[name] Post Debridement Measurements L x W x D (cm) 0.028 N/[name] N/[name] Post Debridement Volume: (cm) Callus: Yes N/[name] N/[name] Periwound Skin Texture: Excoriation: No Induration: No Crepitus: No Rash: No Scarring: No Maceration: No N/[name] N/[name] Periwound Skin Moisture: Dry/Scaly: No Atrophie Blanche: No N/[name] N/[name] Periwound Skin Color: Cyanosis: No Ecchymosis: No Erythema: No Hemosiderin Staining: No Mottled: No Pallor: No Rubor: No No Abnormality N/[name] N/[name] Temperature: Debridement N/[name] N/[name] Procedures Performed: Treatment Notes Wound #4 (Metatarsal head first) Wound Laterality: Plantar, Left Cleanser Soap and Water Discharge Instruction: May shower and wash wound with dial antibacterial soap and water prior to dressing change. Wound Cleanser Discharge Instruction: Cleanse the wound with wound cleanser prior to applying [name] clean dressing using gauze sponges, not tissue or cotton balls. Peri-Wound Care Skin Prep Discharge Instruction: Use skin prep as directed Topical BlastX Primary Dressing Hydrofera Blue Ready Transfer Foam, 2.5x2.5 (in/in) Discharge Instruction: Apply directly to wound bed as directed Secondary Dressing ALLEVYN Gentle Border, 3x3 (in/in) Discharge Instruction: Apply over primary dressing as directed. Optifoam Non-Adhesive Dressing, 4x4 in Discharge Instruction: Apply foam donut over primary dressing as directed. Secured With Compression Wrap Compression Stockings Add-Ons [name], [name] ([id number]) [id number] Page 4 of 7 Electronic Signature(s) Signed: 05/24/2022 2:52:18 PM By: [name] DO Entered By: [name] on 05/24/2022  14:11:37 -------------------------------------------------------------------------------- Multi-Disciplinary Care Plan Details Patient Name: Date of Service: [name], Holly Dess RA H [name]. 05/24/2022 1:15 PM Medical Record Number: [id number] Patient Account Number: [id number] Date of Birth/Sex: Treating RN: [birth year] (72 y.o. Tonita Phoenix, [name] Primary Care Garison Genova: [name] Other Clinician: Referring Jaslyn Bansal: Treating Larz Mark/Extender: [name] in Treatment: 30 Active Inactive Nutrition Nursing Diagnoses: Impaired glucose control: actual or potential Goals: Patient/caregiver verbalizes understanding of need to maintain therapeutic glucose control per primary care physician Date Initiated: 10/21/2021 Target Resolution Date: 06/18/2022 Goal Status: Active Interventions: Assess HgA1c results as ordered upon admission and as needed Provide education on elevated blood sugars and impact on wound healing Notes: Wound/Skin Impairment Nursing Diagnoses: Impaired tissue integrity Goals: Patient/caregiver will verbalize understanding of skin care regimen Date Initiated: 10/21/2021 Target Resolution Date: 06/17/2022 Goal Status: Active Ulcer/skin breakdown will have [name] volume reduction of 30% by week 4 Date Initiated: 10/21/2021 Date Inactivated: 11/29/2021 Target Resolution Date: 11/18/2021 Goal  Status: Met Interventions: Assess patient/caregiver ability to obtain necessary supplies Assess patient/caregiver ability to perform ulcer/skin care regimen upon admission and as needed Assess ulceration(s) every visit Provide education on ulcer and skin care Treatment Activities: Topical wound management initiated : 10/21/2021 Notes: 11/29/21: Wound care regimen continues. Running IVR for skin sub Electronic Signature(s) Signed: 05/26/2022 4:33:02 PM By: Rhae Hammock RN Entered By: Rhae Hammock on 05/24/2022  14:07:46 -------------------------------------------------------------------------------- Pain Assessment Details Patient Name: Date of Service: [name], Holly Dess RA H [name]. 05/24/2022 1:15 PM Medical Record Number: [id number] Patient Account Number: [id number] Date of Birth/Sex: Treating RN: 04-18-1950 (72 y.o. Tonita Phoenix, Dorcas Mcmurray, Narcissa ([id number]) [id number] Page 5 of 7 Primary Care Ellanor Feuerstein: [name] Other Clinician: Referring Linnell Swords: Treating Reighlynn Swiney/Extender: [name] in Treatment: 30 Active Problems Location of Pain Severity and Description of Pain Patient Has Paino No Site Locations Pain Management and Medication Current Pain Management: Electronic Signature(s) Signed: 05/26/2022 4:33:02 PM By: Rhae Hammock RN Entered By: Rhae Hammock on 05/24/2022 13:36:41 -------------------------------------------------------------------------------- Patient/Caregiver Education Details Patient Name: Date of Service: Holly Alar [name]. 3/5/2024andnbsp1:15 PM Medical Record Number: [id number] Patient Account Number: [id number] Date of Birth/Gender: Treating RN: 09-Jan-1951 (72 y.o. Tonita Phoenix, [name] Primary Care Physician: [name] Other Clinician: Referring Physician: Treating Physician/Extender: [name] in Treatment: 33 Education Assessment Education Provided To: Patient Education Topics Provided Wound/Skin Impairment: Methods: Explain/Verbal Responses: Reinforcements needed, State content correctly Electronic Signature(s) Signed: 05/26/2022 4:33:02 PM By: Rhae Hammock RN Entered By: Rhae Hammock on 05/24/2022 14:07:57 -------------------------------------------------------------------------------- Wound Assessment Details Patient Name: Date of Service: [name], Holly Dess RA H [name]. 05/24/2022 1:15 PM Medical Record Number: [id number] Patient Account Number:  [id number] Date of Birth/Sex: Treating RN: April 08, 1950 (72 y.o. Tonita Phoenix, [name] Primary Care Lemario Chaikin: [name] Other Clinician: Referring Vishal Sandlin: Treating [name], [name] ([id number]) [id number] Page 6 of 7 Weeks in Treatment: 30 Wound Status Wound Number: 4 Primary Diabetic Wound/Ulcer of the Lower Extremity Etiology: Wound Location: Left, Plantar Metatarsal head first Wound Open Wounding Event: Gradually Appeared Status: Date Acquired: 10/02/2020 Comorbid Cataracts, Sleep Apnea, Hypertension, Type II Diabetes, Weeks Of Treatment: 30 History: Osteoarthritis, Osteomyelitis, Neuropathy, Received Radiation, Clustered Wound: No Confinement Anxiety Photos Wound Measurements Length: (cm) 0.4 Width: (cm) 0.3 Depth: (cm) 0.3 Area: (cm) 0.094 Volume: (cm) 0.028 % Reduction in Area: 77% % Reduction in Volume: 77.2% Epithelialization: Large (67-100%) Tunneling: No Undermining: Yes Starting Position (o'clock): 12 Ending Position (o'clock): 12 Maximum Distance: (cm) 0.3 Wound Description Classification: Grade 1 Wound Margin: Distinct, outline attached Exudate Amount: Medium Exudate Type: Serosanguineous Exudate Color: red, brown Foul Odor After Cleansing: No Slough/Fibrino No Wound Bed Granulation Amount: Large (67-100%) Exposed Structure Granulation Quality: Pink, Pale Fascia Exposed: No Necrotic Amount: None Present (0%) Fat Layer (Subcutaneous Tissue) Exposed: Yes Tendon Exposed: No Muscle Exposed: No Joint Exposed: No Bone Exposed: No Periwound Skin Texture Texture Color No Abnormalities Noted: No No Abnormalities Noted: No Callus: Yes Atrophie Blanche: No Crepitus: No Cyanosis: No Excoriation: No Ecchymosis: No Induration: No Erythema: No Rash: No Hemosiderin Staining: No Scarring: No Mottled: No Pallor: No Moisture Rubor: No No Abnormalities Noted: No Dry /  Scaly: No Temperature / Pain Maceration: No Temperature: No Abnormality Treatment Notes Wound #4 (Metatarsal head first) Wound Laterality: Plantar, Left Cleanser Soap and Water Discharge Instruction: May shower and wash wound with dial antibacterial soap and water prior to dressing change. [name], [name] ([id number]) [id number] Page 7 of 7  Wound Cleanser Discharge Instruction: Cleanse the wound with wound cleanser prior to applying [name] clean dressing using gauze sponges, not tissue or cotton balls. Peri-Wound Care Skin Prep Discharge Instruction: Use skin prep as directed Topical BlastX Primary Dressing Hydrofera Blue Ready Transfer Foam, 2.5x2.5 (in/in) Discharge Instruction: Apply directly to wound bed as directed Secondary Dressing ALLEVYN Gentle Border, 3x3 (in/in) Discharge Instruction: Apply over primary dressing as directed. Optifoam Non-Adhesive Dressing, 4x4 in Discharge Instruction: Apply foam donut over primary dressing as directed. Secured With Compression Wrap Compression Stockings Environmental education officer) Signed: 05/26/2022 4:33:02 PM By: Rhae Hammock RN Entered By: Rhae Hammock on 05/24/2022 13:42:45 -------------------------------------------------------------------------------- Vitals Details Patient Name: Date of Service: [name], Holly RA H [name]. 05/24/2022 1:15 PM Medical Record Number: [id number] Patient Account Number: [id number] Date of Birth/Sex: Treating RN: Nov 15, 1950 (72 y.o. Tonita Phoenix, [name] Primary Care Akeel Reffner: [name] Other Clinician: Referring Avneet Ashmore: Treating Jacques Willingham/Extender: [name] Weeks in Treatment: 30 Vital Signs Time Taken: 13:36 Temperature (F): 98.3 Pulse (bpm): 91 Respiratory Rate (breaths/min): 17 Blood Pressure (mmHg): 153/71 Reference Range: 80 - 120 mg / dl Electronic Signature(s) Signed: 05/26/2022 4:33:02 PM By: Rhae Hammock RN Entered By:  Rhae Hammock on 05/24/2022 13:38:22

## 2022-05-31 ENCOUNTER — Encounter (HOSPITAL_BASED_OUTPATIENT_CLINIC_OR_DEPARTMENT_OTHER): Payer: PPO | Admitting: Internal Medicine

## 2022-06-06 ENCOUNTER — Encounter (HOSPITAL_BASED_OUTPATIENT_CLINIC_OR_DEPARTMENT_OTHER): Payer: PPO | Admitting: Internal Medicine

## 2022-06-06 DIAGNOSIS — E1142 Type 2 diabetes mellitus with diabetic polyneuropathy: Secondary | ICD-10-CM

## 2022-06-06 DIAGNOSIS — L97522 Non-pressure chronic ulcer of other part of left foot with fat layer exposed: Secondary | ICD-10-CM | POA: Diagnosis not present

## 2022-06-06 DIAGNOSIS — E11621 Type 2 diabetes mellitus with foot ulcer: Secondary | ICD-10-CM

## 2022-06-07 NOTE — Progress Notes (Signed)
Holly, [name] [name] ([id number]) [id number] Page 1 of 11 Visit Report for 06/06/2022 Chief Complaint Document Details Patient Name: Date of Service: Holly [name]. 06/06/2022 2:00 PM Medical Record Number: [id number] Patient Account Number: [id number] Date of Birth/Sex: Treating RN: 1950-03-30 (72 y.o. F) Primary Care Provider: Harlan [name] Other Clinician: Referring Provider: Treating Provider/Extender: Holly [name] in Treatment: 32 Information Obtained from: Patient Chief Complaint Left plantar ulcer 11/12/2019; patient is here referred by podiatry for review of [name] wound on the left first plantar metatarsal head 03/30/2021; right second toe wound s/p Trauma 10/21/2021; First left metatarsal head wound Electronic Signature(s) Signed: 06/06/2022 4:17:30 PM By: Holly Shan DO Entered By: Holly [name] on 06/06/2022 14:12:03 -------------------------------------------------------------------------------- Debridement Details Patient Name: Date of Service: Holly [name], Holly [name]. 06/06/2022 2:00 PM Medical Record Number: [id number] Patient Account Number: [id number] Date of Birth/Sex: Treating RN: 10-01-1950 (72 y.o. [name] Primary Care Provider: Harlan [name] Other Clinician: Referring Provider: Treating Provider/Extender: Holly [name] in Treatment: 32 Debridement Performed for Assessment: Wound #4 Left,Plantar Metatarsal head first Performed By: Physician Holly Shan, DO Debridement Type: Debridement Severity of Tissue Pre Debridement: Fat layer exposed Level of Consciousness (Pre-procedure): Awake and Alert Pre-procedure Verification/Time Out Yes - 14:07 Taken: Start Time: 14:07 T Area Debrided (L x W): otal 0.4 (cm) x 0.2 (cm) = 0.08 (cm) Tissue and other material debrided: Non-Viable, Slough, Slough Level: Non-Viable Tissue Debridement Description: Selective/Open  Wound Instrument: Curette Bleeding: Minimum Hemostasis Achieved: Pressure Response to Treatment: Procedure was tolerated well Level of Consciousness (Post- Awake and Alert procedure): Post Debridement Measurements of Total Wound Length: (cm) 0.4 Width: (cm) 0.2 Depth: (cm) 0.2 Volume: (cm) 0.013 Character of Wound/Ulcer Post Debridement: Improved Severity of Tissue Post Debridement: Fat layer exposed Post Procedure Diagnosis Same as Pre-procedure Notes scribed for Dr. Heber [place] [name] by Holly Peals, RN Electronic Signature(s) Signed: 06/06/2022 4:17:30 PM By: Holly Shan DO Signed: 06/06/2022 4:54:15 PM By: Holly [name] [id number] [email or web address] Page 2 of 11 Signed: 06/06/2022 4:54:15 aylor Entered By: Holly [name] on 06/06/2022 14:08:29 -------------------------------------------------------------------------------- HPI Details Patient Name: Date of Service: Holly [name] H [name]. 06/06/2022 2:00 PM Medical Record Number: [id number] Patient Account Number: [id number] Date of Birth/Sex: Treating RN: 1950-08-04 (72 y.o. F) Primary Care Provider: Harlan [name] Other Clinician: Referring Provider: Treating Provider/Extender: Holly [name] in Treatment: 59 History of Present Illness HPI Description: 05/10/17 patient presents today for initial evaluation and our clinic concerning issues that she has been having with an ulceration on the plantar aspect of the left first metatarsal head. She has been seen [name] for this since August 2018. Subsequently he has recommended bunion surgery as that seems to be the culprit for why there is pressure and friction occurring at the site causing the callous and subsequently the wound. 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 With that being said I do have notes from several of his visits one of which does include an x-ray documentation stating that she had [name] negative x-ray. There was obviously the bunion the notes I have extended from 11 deformity. With that being said she has not had an MRI fortunately this wound does not probe to bone. The notes I have extended from February 08, 2017 through April 14, 2017. During that course she was also placed on antibiotics to help with an infection two weeks ago and this was doxycycline. Fortunately that seems to have completely resolved any infection issues that she may have had. Unfortunately this is an ulcer that she has actually been dealing with since April 2018. She just initially was trying to treat it and care for it on her own. Her most recent hemoglobin A1c was 5.8, she is [name] non-smoker, she does have [name] right first toe amputation and [name] left third toe amputation. Fortunately she's not having any discomfort at the site. 05/17/17 on evaluation today patient's ulcer on the plantar aspect of the first metatarsal region appears to be close at this point. There does not appear to be any evidence of ulcer opening. This is on initial inspection. She has not really had any discomfort but honestly she really was not experiencing [name] lot of pain even before. There is no evidence of infection or fluctuation under the callous which is present at this point. ADMISSION 11/12/2019 This is [name] 72 year old woman that we previously had in this clinic in 2017 for two visits with [name] wound I think roughly in the same area. At that point she had [name] bunion in the left. She has apparently had surgery on this since then. Looking through care everywhere we can see that she has had follow-up with podiatry from 06/12/2019 through 10/28/2019 for [name] wound on the plantar aspect of her left first MTP. She states when this started she was having severe knee pain on the right which  forced her to alter her gait. She has recently received doxycycline which she is finished and Bactroban. She is back to using Medihoney on the wound. She is offloading with the Pegasys shoe. Past medical history includes type 2 diabetes with [name] recent hemoglobin A1c of 6.8, severe type II diabetic neuropathy, history of [name] CVA, stage III chronic renal failure hypertension, depression, of bilateral previous bunion surgery, right first toe amputation and [name] partial left third toe amputation for osteomyelitis. The patient states that she recently had work to do in her mother's house in [place] and so she was on her feet quite [name] lot which delayed the healing. ABI in our clinic was 1.1 on the left 8/31; plantar aspect of her left first metatarsal head. Using silver collagen. This is probably [name] surgical wound 9/14; this is [name] patient who had [name] wound on the plantar aspect of her left first metatarsal head in the setting of [name] significant bunion deformity. We use cervical collagen and [name] offloading shoe. She has significant diabetic neuropathy probably some degree of [name] Charcot deformity in her feet. This is closed over today. Readmission 03/30/2021 Holly [name] is [name] 72 year old female with [name] past medical history of insulin-dependent controlled type 2 diabetes s/p right great toe amputation, right knee osteoarthritis and hypertension that presents to the clinic for [name] second right toe wound. She states she was getting out of the bathtub when her foot got caught the tub and the skin to her second toe was split open. She noticed this after the bath when she saw blood on the floor. She has neuropathy in her feet. She visited the ED following the event on 03/26/2021. They obtained x-rays that showed [name] fracture to the fifth toe but no fracture to the second toe. She was started on doxycycline. She currently denies signs of infection. She has been keeping the area covered with [name] Band-Aid. Readmission 10/21/2021  Ms.  Averyl [name] is [name] 72 year old female with [name] past medical history of insulin dependent controlled type 2 diabetes complicated by peripheral neuropathy that presents to the clinic for [name] left met head wound. She has been following with Dr. Earleen Newport, podiatry for over [name] year for this issue. She has tried collagen, mupirocin ointment and Medihoney. She is at high fall risk and cannot tolerate [name] total contact cast. She has orthotics with specialized inserts for her current issue. She currently denies signs of infection. 8/10; patient presents for follow-up. Unfortunately Regranex was unaffordable at $1200. She has been using Medihoney to the wound bed. She reports aggressively offloading the area. She has offloading inserts. She denies signs of infection. 8/22; patient presents for follow-up. She has been using Medihoney to the wound bed with improvement in wound healing. She has no issues or complaints today. She will be out of town starting next week for the next 2 weeks. 9/11; patient presents for follow-up. She has been using Medihoney to the wound bed. She went on vacation and was last seen 3 weeks ago. She states she had tried to aggressively offload the wound bed. 9/26; patient presents for follow-up. She has been using Medihoney to the wound bed. She has no issues or complaints today. 10/13; patient presents for follow-up. She has been using Medihoney to the wound bed. She has no issues or complaints today. We rediscussed the possibility of [name] total contact cast but she does not feel comfortable as she is [name] high fall risk and lives alone. 10/26; patient presents for follow-up. Has been using Medihoney to the wound bed. She has been approved for Grafix however has [name] 20% co-pay. Patient declined proceeding with this due to financial cost. Patient has no issues or complaints today. 11/16; patient presents for follow-up. She has been using Medihoney to the wound bed. She has been using her diabetic shoes  with an offloading foam donut. She declines total contact cast placement today. 11/30; patient presents for follow-up. She has been using Medihoney to the wound bed. She did not receive silver alginate. She has been using the Pegasys with surgical shoe. There has been improvement in wound healing. Holly [name], Holly [name] ([id number]) [id number] Page 3 of 11 12/8; patient presents for follow-up. She has been using Medihoney and silver alginate to the wound bed. She has been using the Pegasys with surgical shoe without issues. 12/18; patient presents for follow-up. She has been using Medihoney and silver alginate to the wound bed. She continues to use her Pegasys with [name] surgical shoe. She has no issues or complaints today. 12/28; patient presents for follow-up. She has been using Medihoney and silver alginate to the wound bed. She continues to use her peg assist with [name] surgical shoe. She states she tried to aggressively offload the wound bed this week. She has no issues or complaints today. 1/18; patient missed her last clinic appointment due to conjunctivitis. She has been using Medihoney and silver alginate to the wound bed. She continues to use her peg assist with her surgical shoe. 1/23; patient presents for follow-up. She has been using blast X and Hydrofera Blue to the wound bed. She uses her peg assist with her surgical shoe. She has no issues or complaints today. 1/30; patient presents for follow-up. She has been using blast X and Hydrofera Blue to the wound bed. She continues to use her peg assist with her surgical shoe. She has no issues or complaints today. She has not received  her blast X but still has ointment and the samples that were given to her. 2/6; patient presents for follow-up. She is been using blast X and Hydrofera Blue to the wound bed. She continues to use her Pegasys with her surgical shoe for offloading. She has no issues or complaints today. 3/5;  patient missed her last clinic appointment. It has been 1 month since last seen the patient. She has been using blast X and Hydrofera Blue to the wound bed. She continues with her peg assist and surgical shoe for offloading. She denies signs of infection. 3/18; patient presents for follow-up. She has been using blast X and Hydrofera Blue to the wound bed. She has no issues or complaints today. Electronic Signature(s) Signed: 06/06/2022 4:17:30 PM By: Holly Shan DO Entered By: Holly [name] on 06/06/2022 14:16:54 -------------------------------------------------------------------------------- Physical Exam Details Patient Name: Date of Service: Holly [name], Holly [name]. 06/06/2022 2:00 PM Medical Record Number: [id number] Patient Account Number: [id number] Date of Birth/Sex: Treating RN: 10-13-1950 (72 y.o. F) Primary Care Provider: Harlan [name] Other Clinician: Referring Provider: Treating Provider/Extender: Holly [name] in Treatment: 32 Constitutional respirations regular, non-labored and within target range for patient.. Cardiovascular 2+ dorsalis pedis/posterior tibialis pulses. Psychiatric pleasant and cooperative. Notes Left foot: T the left first metatarsal there is an open wound with granulation tissue and nonviable tissue. Postdebridement there is healthy granulation tissue. o No surrounding signs of infection. Electronic Signature(s) Signed: 06/06/2022 4:17:30 PM By: Holly Shan DO Entered By: Holly [name] on 06/06/2022 14:17:36 -------------------------------------------------------------------------------- Physician Orders Details Patient Name: Date of Service: Holly [name], Holly [name]. 06/06/2022 2:00 PM Medical Record Number: [id number] Patient Account Number: [id number] Date of Birth/Sex: Treating RN: 01-24-1951 (72 y.o. [name] Primary Care Provider: Harlan [name] Other Clinician: Referring Provider: Treating  Provider/Extender: Holly [name] in Treatment: 39 Verbal / Phone Orders: No Diagnosis Coding Follow-up Appointments Return appointment in 3 weeks. - w/ Dr. Heber [place] Other: - [employer]=Supplies Prashad, [place] ([id number]) [id number] Page 4 of 11 Run IVR for [facility] Cellular or Tissue Based Products Cellular or Tissue Based Product Type: - IVR for Grafix IVR for [employer]=$225 Copay Bathing/ Shower/ Hygiene May shower and wash wound with soap and water. Negative Presssure Wound Therapy Wound Vac to wound continuously at 132mm/hg pressure - Run IVR for SNAP VAC Edema Control - Lymphedema / SCD / Other Avoid standing for long periods of time. Off-Loading Other: - Diabetic Shoes Relieve pressure/time standing of feet ***Use surgical shoe with peg assist insert with cutout for wound for offloading*** Additional Orders / Instructions Follow Nutritious Diet - Monitor/Control Blood Sugars Non Wound Condition Other Non Wound Condition Orders/Instructions: - Apply corn pad or gauze between great toe and 2nd toe. Wound Treatment Wound #4 - Metatarsal head first Wound Laterality: Plantar, Left Cleanser: Soap and Water 1 x Per Day/30 Days Discharge Instructions: May shower and wash wound with dial antibacterial soap and water prior to dressing change. Cleanser: Wound Cleanser (Generic) 1 x Per Day/30 Days Discharge Instructions: Cleanse the wound with wound cleanser prior to applying [name] clean dressing using gauze sponges, not tissue or cotton balls. Peri-Wound Care: Skin Prep (Generic) 1 x Per Day/30 Days Discharge Instructions: Use skin prep as directed Topical: BlastX 1 x Per Day/30 Days Prim Dressing: Hydrofera Blue Ready Transfer Foam, 2.5x2.5 (in/in) 1 x Per Day/30 Days ary Discharge Instructions: Apply directly to wound bed as directed Secondary Dressing: ALLEVYN Gentle Border, 3x3 (in/in) (Generic) 1 x  Per Day/30 Days Discharge  Instructions: Apply over primary dressing as directed. Secondary Dressing: Optifoam Non-Adhesive Dressing, 4x4 in 1 x Per Day/30 Days Discharge Instructions: Apply foam donut over primary dressing as directed. Electronic Signature(s) Signed: 06/06/2022 4:17:30 PM By: Holly Shan DO Entered By: Holly [name] on 06/06/2022 14:17:56 -------------------------------------------------------------------------------- Problem List Details Patient Name: Date of Service: Holly [name], Holly [name]. 06/06/2022 2:00 PM Medical Record Number: [id number] Patient Account Number: [id number] Date of Birth/Sex: Treating RN: 11/28/1950 (72 y.o. F) Primary Care Provider: Harlan [name] Other Clinician: Referring Provider: Treating Provider/Extender: Holly [name] in Treatment: 24 Active Problems ICD-10 Encounter Code Description Active Date MDM Diagnosis L97.522 Non-pressure chronic ulcer of other part of left foot with fat layer exposed 10/21/2021 No Yes [place], [place] [name] ([id number]) [phone].pdf Page 5 of 11 E11.621 Type 2 diabetes mellitus with foot ulcer 10/21/2021 No Yes E11.42 Type 2 diabetes mellitus with diabetic polyneuropathy 10/21/2021 No Yes Inactive Problems Resolved Problems Electronic Signature(s) Signed: 06/06/2022 4:17:30 PM By: Holly Shan DO Entered By: Holly [name] on 06/06/2022 14:11:36 -------------------------------------------------------------------------------- Progress Note Details Patient Name: Date of Service: Holly [name], Holly [name]. 06/06/2022 2:00 PM Medical Record Number: [id number] Patient Account Number: [id number] Date of Birth/Sex: Treating RN: 1951/01/20 (72 y.o. F) Primary Care Provider: Harlan [name] Other Clinician: Referring Provider: Treating Provider/Extender: Holly [name] in Treatment: 11 Subjective Chief Complaint Information obtained from Patient Left plantar ulcer 11/12/2019;  patient is here referred by podiatry for review of [name] wound on the left first plantar metatarsal head 03/30/2021; right second toe wound s/p Trauma 10/21/2021; First left metatarsal head wound History of Present Illness (HPI) 05/10/17 patient presents today for initial evaluation and our clinic concerning issues that she has been having with an ulceration on the plantar aspect of the left first metatarsal head. She has been seen [name] for this since August 2018. Subsequently he has recommended bunion surgery as that seems to be the culprit for why there is pressure and friction occurring at the site causing the callous and subsequently the wound. 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 With that being said I do have notes from several of his visits one of which does include an x-ray documentation stating that she had [name] negative x-ray. There was obviously the bunion the notes I have extended from 11 deformity. With that being said she has not had an MRI fortunately this wound does not probe to bone. The notes I have extended from February 08, 2017 through April 14, 2017. During that course she was also placed on antibiotics to help with an infection two weeks ago and this was doxycycline. Fortunately that seems to have completely resolved any infection issues that she may have had. Unfortunately this is an ulcer that she has actually been dealing with since April 2018. She just initially was trying to treat it and care for it on her own. Her most recent hemoglobin A1c was 5.8, she is [name] non-smoker, she does have [name] right first toe amputation and [name] left third toe amputation. Fortunately she's not having any discomfort at the site. 05/17/17 on evaluation today patient's ulcer on the plantar aspect of the first metatarsal region appears to be close at  this point. There does not appear to be any evidence of ulcer opening. This is on  initial inspection. She has not really had any discomfort but honestly she really was not experiencing [name] lot of pain even before. There is no evidence of infection or fluctuation under the callous which is present at this point. ADMISSION 11/12/2019 This is [name] 72 year old woman that we previously had in this clinic in 2017 for two visits with [name] wound I think roughly in the same area. At that point she had [name] bunion in the left. She has apparently had surgery on this since then. Looking through care everywhere we can see that she has had follow-up with podiatry from 06/12/2019 through 10/28/2019 for [name] wound on the plantar aspect of her left first MTP. She states when this started she was having severe knee pain on the right which forced her to alter her gait. She has recently received doxycycline which she is finished and Bactroban. She is back to using Medihoney on the wound. She is offloading with the Pegasys shoe. Past medical history includes type 2 diabetes with [name] recent hemoglobin A1c of 6.8, severe type II diabetic neuropathy, history of [name] CVA, stage III chronic renal failure hypertension, depression, of bilateral previous bunion surgery, right first toe amputation and [name] partial left third toe amputation for osteomyelitis. The patient states that she recently had work to do in her mother's house in [place] and so she was on her feet quite [name] lot which delayed the healing. ABI in our clinic was 1.1 on the left 8/31; plantar aspect of her left first metatarsal head. Using silver collagen. This is probably [name] surgical wound 9/14; this is [name] patient who had [name] wound on the plantar aspect of her left first metatarsal head in the setting of [name] significant bunion deformity. We use cervical collagen and [name] offloading shoe. She has significant diabetic neuropathy probably some degree of [name] Charcot deformity in her feet. This is  closed over today. Readmission 03/30/2021 Holly [name] is [name] 72 year old female with [name] past medical history of insulin-dependent controlled type 2 diabetes s/p right great toe amputation, right knee osteoarthritis and hypertension that presents to the clinic for [name] second right toe wound. She states she was getting out of the bathtub when her foot got caught the tub and the skin to her second toe was split open. She noticed this after the bath when she saw blood on the floor. She has neuropathy in her feet. She visited the ED following the event on 03/26/2021. They obtained x-rays that showed [name] fracture to the fifth toe but no fracture to the second toe. She was started on doxycycline. She currently denies signs of infection. She has been keeping the area covered with [name] Band-Aid. Readmission 10/21/2021 Holly [name] is [name] 72 year old female with [name] past medical history of insulin dependent controlled type 2 diabetes complicated by peripheral neuropathy that presents to the clinic for [name] left met head wound. She has been following with Dr. Earleen Newport, podiatry for over [name] year for this issue. She has tried collagen, mupirocin ointment and Medihoney. She is at high fall risk and cannot tolerate [name] total contact cast. She has orthotics with specialized inserts for her current Holly [name], Holly [name] ([id number]) [id number] Page 6 of 11 issue. She currently denies signs of infection. 8/10; patient presents for follow-up. Unfortunately Regranex was unaffordable at $1200. She has been using Medihoney to the wound bed. She reports aggressively offloading the area. She has offloading inserts. She denies signs of infection. 8/22; patient presents for follow-up. She has  been using Medihoney to the wound bed with improvement in wound healing. She has no issues or complaints today. She will be out of town starting next week for the next 2 weeks. 9/11; patient presents for follow-up. She has been  using Medihoney to the wound bed. She went on vacation and was last seen 3 weeks ago. She states she had tried to aggressively offload the wound bed. 9/26; patient presents for follow-up. She has been using Medihoney to the wound bed. She has no issues or complaints today. 10/13; patient presents for follow-up. She has been using Medihoney to the wound bed. She has no issues or complaints today. We rediscussed the possibility of [name] total contact cast but she does not feel comfortable as she is [name] high fall risk and lives alone. 10/26; patient presents for follow-up. Has been using Medihoney to the wound bed. She has been approved for Grafix however has [name] 20% co-pay. Patient declined proceeding with this due to financial cost. Patient has no issues or complaints today. 11/16; patient presents for follow-up. She has been using Medihoney to the wound bed. She has been using her diabetic shoes with an offloading foam donut. She declines total contact cast placement today. 11/30; patient presents for follow-up. She has been using Medihoney to the wound bed. She did not receive silver alginate. She has been using the Pegasys with surgical shoe. There has been improvement in wound healing. 12/8; patient presents for follow-up. She has been using Medihoney and silver alginate to the wound bed. She has been using the Pegasys with surgical shoe without issues. 12/18; patient presents for follow-up. She has been using Medihoney and silver alginate to the wound bed. She continues to use her Pegasys with [name] surgical shoe. She has no issues or complaints today. 12/28; patient presents for follow-up. She has been using Medihoney and silver alginate to the wound bed. She continues to use her peg assist with [name] surgical shoe. She states she tried to aggressively offload the wound bed this week. She has no issues or complaints today. 1/18; patient missed her last clinic appointment due to conjunctivitis. She has been  using Medihoney and silver alginate to the wound bed. She continues to use her peg assist with her surgical shoe. 1/23; patient presents for follow-up. She has been using blast X and Hydrofera Blue to the wound bed. She uses her peg assist with her surgical shoe. She has no issues or complaints today. 1/30; patient presents for follow-up. She has been using blast X and Hydrofera Blue to the wound bed. She continues to use her peg assist with her surgical shoe. She has no issues or complaints today. She has not received her blast X but still has ointment and the samples that were given to her. 2/6; patient presents for follow-up. She is been using blast X and Hydrofera Blue to the wound bed. She continues to use her Pegasys with her surgical shoe for offloading. She has no issues or complaints today. 3/5; patient missed her last clinic appointment. It has been 1 month since last seen the patient. She has been using blast X and Hydrofera Blue to the wound bed. She continues with her peg assist and surgical shoe for offloading. She denies signs of infection. 3/18; patient presents for follow-up. She has been using blast X and Hydrofera Blue to the wound bed. She has no issues or complaints today. Patient History Information obtained from Patient. Family History Cancer - Father, Hypertension - Mother,Father,Siblings,  Stroke - Mother,Father, No family history of Diabetes, Heart Disease, Hereditary Spherocytosis, Kidney Disease, Lung Disease, Seizures, Thyroid Problems, Tuberculosis. Social History Never smoker, Marital Status - Single, Alcohol Use - Never, Drug Use - No History, Caffeine Use - Rarely. Medical History Eyes Patient has history of Cataracts Denies history of Glaucoma, Optic Neuritis Ear/Nose/Mouth/Throat Denies history of Chronic sinus problems/congestion, Middle ear problems Hematologic/Lymphatic Denies history of Anemia, Hemophilia, Human Immunodeficiency Virus, Lymphedema, Sickle  Cell Disease Respiratory Patient has history of Sleep Apnea Denies history of Aspiration, Asthma, Chronic Obstructive Pulmonary Disease (COPD), Pneumothorax, Tuberculosis Cardiovascular Patient has history of Hypertension Denies history of Angina, Arrhythmia, Congestive Heart Failure, Coronary Artery Disease, Deep Vein Thrombosis, Hypotension, Myocardial Infarction, Peripheral Arterial Disease, Peripheral Venous Disease, Phlebitis, Vasculitis Gastrointestinal Denies history of Cirrhosis , Colitis, Crohnoos, Hepatitis [name], Hepatitis B, Hepatitis C Endocrine Patient has history of Type II Diabetes Denies history of Type I Diabetes Genitourinary Denies history of End Stage Renal Disease Immunological Denies history of Lupus Erythematosus, Raynaudoos, Scleroderma Integumentary (Skin) Denies history of History of Burn Musculoskeletal Patient has history of Osteoarthritis, Osteomyelitis - Hx in 2007 (left 3rd toe) Denies history of Gout, Rheumatoid Arthritis Neurologic [place], [place] [name] ([id number]) [id number] Page 7 of 11 Patient has history of Neuropathy Denies history of Dementia, Quadriplegia, Paraplegia, Seizure Disorder Oncologic Patient has history of Received Radiation - 03/2020 Denies history of Received Chemotherapy Psychiatric Patient has history of Confinement Anxiety Denies history of Anorexia/bulimia Hospitalization/Surgery History - Amputation of right great toe. - Tendon repair. - Hysterectomy. - Osteomyelitis left 3rd toe. - cervical fusion with cage. - left breast lumpectomy. Medical [name] Surgical History Notes nd Constitutional Symptoms (General Health) morbid obesity Cardiovascular Takotsubo cardiomyopathy 2012, hyperlipidemia, Hx CVA, Tachycardia Genitourinary CKD stage 3 Musculoskeletal left leg weakness Neurologic CVA Oncologic hx endometrial cancer, left breast CA Psychiatric depression Objective Constitutional respirations  regular, non-labored and within target range for patient.. Vitals Time Taken: 1:56 PM, Temperature: 97.5 F, Pulse: 84 bpm, Respiratory Rate: 16 breaths/min, Blood Pressure: 185/99 mmHg. Cardiovascular 2+ dorsalis pedis/posterior tibialis pulses. Psychiatric pleasant and cooperative. General Notes: Left foot: T the left first metatarsal there is an open wound with granulation tissue and nonviable tissue. Postdebridement there is healthy o granulation tissue. No surrounding signs of infection. Integumentary (Hair, Skin) Wound #4 status is Open. Original cause of wound was Gradually Appeared. The date acquired was: 10/02/2020. The wound has been in treatment 32 weeks. The wound is located on the Left,Plantar Metatarsal head first. The wound measures 0.4cm length x 0.2cm width x 0.2cm depth; 0.063cm^2 area and 0.013cm^3 volume. There is Fat Layer (Subcutaneous Tissue) exposed. There is no tunneling noted, however, there is undermining starting at 12:00 and ending at 3:00 with [name] maximum distance of 0.2cm. There is [name] medium amount of serosanguineous drainage noted. The wound margin is distinct with the outline attached to the wound base. There is large (67-100%) pink, pale granulation within the wound bed. There is no necrotic tissue within the wound bed. The periwound skin appearance exhibited: Callus. The periwound skin appearance did not exhibit: Crepitus, Excoriation, Induration, Rash, Scarring, Dry/Scaly, Maceration, Atrophie Blanche, Cyanosis, Ecchymosis, Hemosiderin Staining, Mottled, Pallor, Rubor, Erythema. Periwound temperature was noted as No Abnormality. Assessment Active Problems ICD-10 Non-pressure chronic ulcer of other part of left foot with fat layer exposed Type 2 diabetes mellitus with foot ulcer Type 2 diabetes mellitus with diabetic polyneuropathy Patient's wound has shown improvement in size in appearance since last clinic visit. I debrided nonviable tissue and recommended  continue with Hydrofera Blue and blast X for dressing changes. Continue aggressive offloading with peg assist in surgical shoe. Patient will be out of town in 2 weeks I will see her back in 3 weeks. We are still awaiting insurance verification for snap VAC. Procedures Holly [name], Holly [name] ([id number]) [id number] Page 8 of 11 Wound #4 Pre-procedure diagnosis of Wound #4 is [name] Diabetic Wound/Ulcer of the Lower Extremity located on the Left,Plantar Metatarsal head first .Severity of Tissue Pre Debridement is: Fat layer exposed. There was [name] Selective/Open Wound Non-Viable Tissue Debridement with [name] total area of 0.08 sq cm performed by Holly Shan, DO. With the following instrument(s): Curette to remove Non-Viable tissue/material. Material removed includes High [place]. No specimens were taken. [name] time out was conducted at 14:07, prior to the start of the procedure. [name] Minimum amount of bleeding was controlled with Pressure. The procedure was tolerated well. Post Debridement Measurements: 0.4cm length x 0.2cm width x 0.2cm depth; 0.013cm^3 volume. Character of Wound/Ulcer Post Debridement is improved. Severity of Tissue Post Debridement is: Fat layer exposed. Post procedure Diagnosis Wound #4: Same as Pre-Procedure General Notes: scribed for Dr. Heber [place] by Holly Peals, RN. Plan Follow-up Appointments: Return appointment in 3 weeks. - w/ Dr. Heber [place] Other: - Prism=Supplies Run IVR for [facility] Cellular or Tissue Based Products: Cellular or Tissue Based Product Type: - IVR for Grafix IVR for [employer]=$225 [place] Bathing/ Shower/ Hygiene: May shower and wash wound with soap and water. Negative Presssure Wound Therapy: Wound Vac to wound continuously at 137mm/hg pressure - Run IVR for SNAP VAC Edema Control - Lymphedema / SCD / Other: Avoid standing for long periods of time. Off-Loading: Other: - Diabetic Shoes Relieve pressure/time standing of feet ***Use surgical shoe  with peg assist insert with cutout for wound for offloading*** Additional Orders / Instructions: Follow Nutritious Diet - Monitor/Control Blood Sugars Non Wound Condition: Other Non Wound Condition Orders/Instructions: - Apply corn pad or gauze between great toe and 2nd toe. WOUND #4: - Metatarsal head first Wound Laterality: Plantar, Left Cleanser: Soap and Water 1 x Per Day/30 Days Discharge Instructions: May shower and wash wound with dial antibacterial soap and water prior to dressing change. Cleanser: Wound Cleanser (Generic) 1 x Per Day/30 Days Discharge Instructions: Cleanse the wound with wound cleanser prior to applying [name] clean dressing using gauze sponges, not tissue or cotton balls. Peri-Wound Care: Skin Prep (Generic) 1 x Per Day/30 Days Discharge Instructions: Use skin prep as directed Topical: BlastX 1 x Per Day/30 Days Prim Dressing: Hydrofera Blue Ready Transfer Foam, 2.5x2.5 (in/in) 1 x Per Day/30 Days ary Discharge Instructions: Apply directly to wound bed as directed Secondary Dressing: ALLEVYN Gentle Border, 3x3 (in/in) (Generic) 1 x Per Day/30 Days Discharge Instructions: Apply over primary dressing as directed. Secondary Dressing: Optifoam Non-Adhesive Dressing, 4x4 in 1 x Per Day/30 Days Discharge Instructions: Apply foam donut over primary dressing as directed. 1. In office sharp debridement 2. Plastics and Hydrofera Blue 3. Aggressive offloadingoopeg assist in surgical shoe Electronic Signature(s) Signed: 06/06/2022 4:17:30 PM By: Holly Shan DO Entered By: Holly [name] on 06/06/2022 14:19:50 -------------------------------------------------------------------------------- HxROS Details Patient Name: Date of Service: Holly [name], Holly [name]. 06/06/2022 2:00 PM Medical Record Number: [id number] Patient Account Number: [id number] Date of Birth/Sex: Treating RN: December 22, 1950 (72 y.o. F) Primary Care Provider: Harlan [name] Other Clinician: Referring  Provider: Treating Provider/Extender: Holly [name] in Treatment: 52 Information Obtained From Patient Constitutional Symptoms (General Health) Medical History: Past Medical History Notes: morbid obesity  Eyes Holly [name], Holly [name] ([id number]) [id number] Page 9 of 11 Medical History: Positive for: Cataracts Negative for: Glaucoma; Optic Neuritis Ear/Nose/Mouth/Throat Medical History: Negative for: Chronic sinus problems/congestion; Middle ear problems Hematologic/Lymphatic Medical History: Negative for: Anemia; Hemophilia; Human Immunodeficiency Virus; Lymphedema; Sickle Cell Disease Respiratory Medical History: Positive for: Sleep Apnea Negative for: Aspiration; Asthma; Chronic Obstructive Pulmonary Disease (COPD); Pneumothorax; Tuberculosis Cardiovascular Medical History: Positive for: Hypertension Negative for: Angina; Arrhythmia; Congestive Heart Failure; Coronary Artery Disease; Deep Vein Thrombosis; Hypotension; Myocardial Infarction; Peripheral Arterial Disease; Peripheral Venous Disease; Phlebitis; Vasculitis Past Medical History Notes: Takotsubo cardiomyopathy 2012, hyperlipidemia, Hx CVA, Tachycardia Gastrointestinal Medical History: Negative for: Cirrhosis ; Colitis; Crohns; Hepatitis [name]; Hepatitis B; Hepatitis C Endocrine Medical History: Positive for: Type II Diabetes Negative for: Type I Diabetes Time with diabetes: over 20 years Treated with: Insulin, Oral agents Blood sugar tested every day: Yes Tested : daily Genitourinary Medical History: Negative for: End Stage Renal Disease Past Medical History Notes: CKD stage 3 Immunological Medical History: Negative for: Lupus Erythematosus; Raynauds; Scleroderma Integumentary (Skin) Medical History: Negative for: History of Burn Musculoskeletal Medical History: Positive for: Osteoarthritis; Osteomyelitis - Hx in 2007 (left 3rd toe) Negative for: Gout;  Rheumatoid Arthritis Past Medical History Notes: left leg weakness Neurologic Medical History: Positive for: Neuropathy Negative for: Dementia; Quadriplegia; Paraplegia; Seizure Disorder Past Medical History Notes: CVA Oncologic Medical History: Positive for: Received Radiation - 03/2020 Holly [name], Holly [name] ([id number]) [id number] Page 10 of 11 Negative for: Received Chemotherapy Past Medical History Notes: hx endometrial cancer, left breast CA Psychiatric Medical History: Positive for: Confinement Anxiety Negative for: Anorexia/bulimia Past Medical History Notes: depression HBO Extended History Items Eyes: Cataracts Immunizations Pneumococcal Vaccine: Received Pneumococcal Vaccination: Yes Received Pneumococcal Vaccination On or After 60th Birthday: Yes Implantable Devices None Hospitalization / Surgery History Type of Hospitalization/Surgery Amputation of right great toe Tendon repair Hysterectomy Osteomyelitis left 3rd toe cervical fusion with cage left breast lumpectomy Family and Social History Cancer: Yes - Father; Diabetes: No; Heart Disease: No; Hereditary Spherocytosis: No; Hypertension: Yes - Mother,Father,Siblings; Kidney Disease: No; Lung Disease: No; Seizures: No; Stroke: Yes - Mother,Father; Thyroid Problems: No; Tuberculosis: No; Never smoker; Marital Status - Single; Alcohol Use: Never; Drug Use: No History; Caffeine Use: Rarely; Financial Concerns: No; Food, Clothing or Shelter Needs: No; Support System Lacking: No; Transportation Concerns: No Electronic Signature(s) Signed: 06/06/2022 4:17:30 PM By: Holly Shan DO Entered By: Holly [name] on 06/06/2022 14:17:08 -------------------------------------------------------------------------------- SuperBill Details Patient Name: Date of Service: Holly [name], Holly [name]. 06/06/2022 Medical Record Number: [id number] Patient Account Number: [id number] Date of Birth/Sex: Treating  RN: 1951/02/05 (72 y.o. F) Primary Care Provider: Harlan [name] Other Clinician: Referring Provider: Treating Provider/Extender: Holly [name] in Treatment: 32 Diagnosis Coding ICD-10 Codes Code Description [phone] Non-pressure chronic ulcer of other part of left foot with fat layer exposed E11.621 Type 2 diabetes mellitus with foot ulcer E11.42 Type 2 diabetes mellitus with diabetic polyneuropathy Facility Procedures Physician Procedures : CPT4 Code Description Modifier [id number] - WC PHYS DEBR WO ANESTH 20 SQ CM ICD-10 Diagnosis Description E11.621 Type 2 diabetes mellitus with foot ulcer L97.522 Non-pressure chronic ulcer of other part of left foot with fat layer exposed E11.42  Type 2 diabetes mellitus with diabetic polyneuropathy Quantity: 1 Electronic Signature(s) Signed: 06/06/2022 4:17:30 PM By: Holly Shan DO Entered By: Holly [name] on 06/06/2022 14:20:01

## 2022-06-07 NOTE — Progress Notes (Signed)
NAOMY, CHRONIS A ([id number]) [id number] Page 1 of 7 Visit Report for 06/06/2022 Arrival Information Details Patient Name: Date of Service: [name] RA H A. 06/06/2022 2:00 PM Medical Record Number: [id number] Patient Account Number: [id number] Date of Birth/Sex: Treating RN: 08-12-1950 (72 y.o. [name] Primary Care Micharl Helmes: [name] Other Clinician: Referring Sadae Arrazola: Treating Shantina Chronister/Extender: [name] in [name]: 7 Visit Information History Since Last Visit Added or deleted any medications: No Patient Arrived: [name] Any new allergies or adverse reactions: No Arrival Time: 13:55 Had a fall or experienced change in No Accompanied By: self activities of daily living that may affect Transfer Assistance: None risk of falls: Patient Identification Verified: Yes Signs or symptoms of abuse/neglect since last visito No Secondary Verification Process Completed: Yes Hospitalized since last visit: No Patient Requires Transmission-Based Precautions: No Implantable device outside of the clinic excluding No Patient Has Alerts: Yes cellular tissue based products placed in the center Patient Alerts: ABI's R=1.07 L=1.02 since last visit: Has Dressing in Place as Prescribed: Yes Pain Present Now: No Electronic Signature(s) Signed: 06/06/2022 4:54:15 PM By: Adline Peals Entered By: Adline Peals on 06/06/2022 13:56:07 -------------------------------------------------------------------------------- Encounter Discharge Information Details Patient Name: Date of Service: [name], Holly RA H A. 06/06/2022 2:00 PM Medical Record Number: [id number] Patient Account Number: [id number] Date of Birth/Sex: Treating RN: [birth year] (72 y.o. [name] Primary Care Roslyn Else: [name] Other Clinician: Referring Leslee Suire: Treating [name]/Extender: [name] in [name]:  84 Encounter Discharge Information Items Post Procedure Vitals Discharge Condition: Stable Temperature (F): 97.5 Ambulatory Status: Ambulatory Pulse (bpm): 84 Discharge Destination: Home Respiratory Rate (breaths/min): 16 Transportation: Private Auto Blood Pressure (mmHg): 185/99 Accompanied By: self Schedule Follow-up Appointment: Yes Clinical Summary of Care: Patient Declined Electronic Signature(s) Signed: 06/06/2022 4:54:15 PM By: Adline Peals Entered By: Adline Peals on 06/06/2022 14:19:16 -------------------------------------------------------------------------------- Lower Extremity Assessment Details Patient Name: Date of Service: [name], Holly Dess RA H A. 06/06/2022 2:00 PM Medical Record Number: [id number] Patient Account Number: [id number] Date of Birth/Sex: Treating RN: 04/09/1950 (72 y.o. [name] Primary Care Keya Wynes: [name] Other Clinician: Referring Bevan Vu: Treating Sian Rockers/Extender: [name]: 32 Edema Assessment Assessed: [Left: No] [Right: No] G[LeftJHERICA, KRICHBAUM [id number] [Right: [id number] Page 2 of 7] Edema: [Left: N] [Right: o] Calf Left: Right: Point of Measurement: 34 cm From Medial Instep 47.4 cm Ankle Left: Right: Point of Measurement: 9 cm From Medial Instep 29.5 cm Electronic Signature(s) Signed: 06/06/2022 4:54:15 PM By: Adline Peals Entered By: Adline Peals on 06/06/2022 13:57:07 -------------------------------------------------------------------------------- Multi Wound Chart Details Patient Name: Date of Service: [name], Holly RA H A. 06/06/2022 2:00 PM Medical Record Number: [id number] Patient Account Number: [id number] Date of Birth/Sex: Treating RN: Apr 25, 1950 (72 y.o. F) Primary Care Ladiamond Gallina: [name] Other Clinician: Referring Jurnie Garritano: Treating Homero Hyson/Extender: [name] in  [name]: 64 Vital Signs Height(in): Pulse(bpm): 79 Weight(lbs): Blood Pressure(mmHg): 185/99 Body Mass Index(BMI): Temperature(F): 97.5 Respiratory Rate(breaths/min): 16 [4:Photos:] [N/A:N/A] Left, Plantar Metatarsal head first N/A N/A Wound Location: Gradually Appeared N/A N/A Wounding Event: Diabetic Wound/Ulcer of the Lower N/A N/A Primary Etiology: Extremity Cataracts, Sleep Apnea, Hypertension, N/A N/A Comorbid History: Type II Diabetes, Osteoarthritis, Osteomyelitis, Neuropathy, Received Radiation, Confinement Anxiety 10/02/2020 N/A N/A Date Acquired: 21 N/A N/A Weeks of [name]: Open N/A N/A Wound Status: No N/A N/A Wound Recurrence: 0.4x0.2x0.2 N/A N/A Measurements L x W x D (cm) 0.063 N/A N/A A (cm) : rea 0.013 N/A  N/A Volume (cm) : 84.60% N/A N/A % Reduction in A rea: 89.40% N/A N/A % Reduction in Volume: 12 Starting Position 1 (o'clock): 3 Ending Position 1 (o'clock): 0.2 Maximum Distance 1 (cm): Yes N/A N/A Undermining: Grade 1 N/A N/A Classification: Medium N/A N/A Exudate A mount: Serosanguineous N/A N/A Exudate Type: red, brown N/A N/A Exudate Color: Distinct, outline attached N/A N/A Wound Margin: Large (67-100%) N/A N/A Granulation A mount: Pink, Pale N/A N/A Granulation Quality: None Present (0%) N/A N/A Necrotic A mount: Fat Layer (Subcutaneous Tissue): Yes N/A N/A Exposed Structures: Fascia: No Tendon: No Wisenbaker, Nanci A ([id number] Page 3 of 7 Muscle: No Joint: No Bone: No Large (67-100%) N/A N/A Epithelialization: Debridement - Selective/Open Wound N/A N/A Debridement: Pre-procedure Verification/Time Out 14:07 N/A N/A Taken: Slough N/A N/A Tissue Debrided: Non-Viable Tissue N/A N/A Level: 0.08 N/A N/A Debridement A (sq cm): rea Curette N/A N/A Instrument: Minimum N/A N/A Bleeding: Pressure N/A N/A Hemostasis A chieved: Procedure was tolerated well N/A  N/A Debridement [name] Response: 0.4x0.2x0.2 N/A N/A Post Debridement Measurements L x W x D (cm) 0.013 N/A N/A Post Debridement Volume: (cm) Callus: Yes N/A N/A Periwound Skin Texture: Excoriation: No Induration: No Crepitus: No Rash: No Scarring: No Maceration: No N/A N/A Periwound Skin Moisture: Dry/Scaly: No Atrophie Blanche: No N/A N/A Periwound Skin Color: Cyanosis: No Ecchymosis: No Erythema: No Hemosiderin Staining: No Mottled: No Pallor: No Rubor: No No Abnormality N/A N/A Temperature: Debridement N/A N/A Procedures Performed: [name] Notes Electronic Signature(s) Signed: 06/06/2022 4:17:30 PM By: [name] DO Entered By: [name] on 06/06/2022 14:11:54 -------------------------------------------------------------------------------- Multi-Disciplinary Care Plan Details Patient Name: Date of Service: [name], Holly RA H A. 06/06/2022 2:00 PM Medical Record Number: [id number] Patient Account Number: [id number] Date of Birth/Sex: Treating RN: Apr 08, 1950 (72 y.o. [name] Primary Care Ayiana Winslett: [name] Other Clinician: Referring Jaimie Pippins: Treating Fong Mccarry/Extender: [name] in [name]: 48 Active Inactive Nutrition Nursing Diagnoses: Impaired glucose control: actual or potential Goals: Patient/caregiver verbalizes understanding of need to maintain therapeutic glucose control per primary care physician Date Initiated: 10/21/2021 Target Resolution Date: 06/18/2022 Goal Status: Active Interventions: Assess HgA1c results as ordered upon admission and as needed Provide education on elevated blood sugars and impact on wound healing Notes: Wound/Skin Impairment Nursing Diagnoses: Impaired tissue integrity Raimer, [place] A ([id number]) [id number] Page 4 of 7 Goals: Patient/caregiver will verbalize understanding of skin care regimen Date Initiated: 10/21/2021 Target  Resolution Date: 06/17/2022 Goal Status: Active Ulcer/skin breakdown will have a volume reduction of 30% by week 4 Date Initiated: 10/21/2021 Date Inactivated: 11/29/2021 Target Resolution Date: 11/18/2021 Goal Status: Met Interventions: Assess patient/caregiver ability to obtain necessary supplies Assess patient/caregiver ability to perform ulcer/skin care regimen upon admission and as needed Assess ulceration(s) every visit Provide education on ulcer and skin care [name] Activities: Topical wound management initiated : 10/21/2021 Notes: 11/29/21: Wound care regimen continues. Running IVR for skin sub Electronic Signature(s) Signed: 06/06/2022 4:54:15 PM By: Adline Peals Entered By: Adline Peals on 06/06/2022 14:09:25 -------------------------------------------------------------------------------- Pain Assessment Details Patient Name: Date of Service: [name], Holly RA H A. 06/06/2022 2:00 PM Medical Record Number: [id number] Patient Account Number: [id number] Date of Birth/Sex: Treating RN: Dec 24, 1950 (72 y.o. [name] Primary Care Meighan Treto: [name] Other Clinician: Referring Rip Hawes: Treating Jule Schlabach/Extender: [name] in [name]: 32 Active Problems Location of Pain Severity and Description of Pain Patient Has Paino No Site Locations Rate the pain. Current Pain Level: 0 Pain Management and  Medication Current Pain Management: Electronic Signature(s) Signed: 06/06/2022 4:54:15 PM By: Adline Peals Entered By: Adline Peals on 06/06/2022 13:57:03 -------------------------------------------------------------------------------- Patient/Caregiver Education Details Patient Name: Date of Service: [name] RA H A. 3/18/2024andnbsp2:00 PM Medical Record Number: [id number] Patient Account Number: [id number] MAIZEE, SHERRER A ([id number]) [id number] Page 5 of 7 Date of Birth/Gender:  Treating RN: 02-05-1951 (72 y.o. [name] Primary Care Physician: [name] Other Clinician: Referring Physician: Treating Physician/Extender: [name] in [name]: 44 Education Assessment Education Provided To: Patient Education Topics Provided Safety: Methods: Explain/Verbal Responses: Reinforcements needed, State content correctly Electronic Signature(s) Signed: 06/06/2022 4:54:15 PM By: Adline Peals Entered By: Adline Peals on 06/06/2022 14:09:40 -------------------------------------------------------------------------------- Wound Assessment Details Patient Name: Date of Service: [name], Holly RA H A. 06/06/2022 2:00 PM Medical Record Number: [id number] Patient Account Number: [id number] Date of Birth/Sex: Treating RN: [birth year] (72 y.o. [name] Primary Care Hiilei Gerst: [name] Other Clinician: Referring Tristy Udovich: Treating Biran Mayberry/Extender: [name]: 32 Wound Status Wound Number: 4 Primary Diabetic Wound/Ulcer of the Lower Extremity Etiology: Wound Location: Left, Plantar Metatarsal head first Wound Open Wounding Event: Gradually Appeared Status: Date Acquired: 10/02/2020 Comorbid Cataracts, Sleep Apnea, Hypertension, Type II Diabetes, Weeks Of [name]: 32 History: Osteoarthritis, Osteomyelitis, Neuropathy, Received Radiation, Clustered Wound: No Confinement Anxiety Photos Wound Measurements Length: (cm) 0.4 Width: (cm) 0.2 Depth: (cm) 0.2 Area: (cm) 0.063 Volume: (cm) 0.013 % Reduction in Area: 84.6% % Reduction in Volume: 89.4% Epithelialization: Large (67-100%) Tunneling: No Undermining: Yes Starting Position (o'clock): 12 Ending Position (o'clock): 3 Maximum Distance: (cm) 0.2 Wound Description Classification: Grade 1 Wound Margin: Distinct, outline attached Exudate Amount: Medium Exudate Type: Serosanguineous Exudate Color: red,  brown Mccathern, Karalyn A ([id number]) Foul Odor After Cleansing: No Slough/Fibrino No [id number] Page 6 of 7 Wound Bed Granulation Amount: Large (67-100%) Exposed Structure Granulation Quality: Pink, Pale Fascia Exposed: No Necrotic Amount: None Present (0%) Fat Layer (Subcutaneous Tissue) Exposed: Yes Tendon Exposed: No Muscle Exposed: No Joint Exposed: No Bone Exposed: No Periwound Skin Texture Texture Color No Abnormalities Noted: No No Abnormalities Noted: No Callus: Yes Atrophie Blanche: No Crepitus: No Cyanosis: No Excoriation: No Ecchymosis: No Induration: No Erythema: No Rash: No Hemosiderin Staining: No Scarring: No Mottled: No Pallor: No Moisture Rubor: No No Abnormalities Noted: No Dry / Scaly: No Temperature / Pain Maceration: No Temperature: No Abnormality [name] Notes Wound #4 (Metatarsal head first) Wound Laterality: Plantar, Left Cleanser Soap and Water Discharge Instruction: May shower and wash wound with dial antibacterial soap and water prior to dressing change. Wound Cleanser Discharge Instruction: Cleanse the wound with wound cleanser prior to applying a clean dressing using gauze sponges, not tissue or cotton balls. Peri-Wound Care Skin Prep Discharge Instruction: Use skin prep as directed Topical BlastX Primary Dressing Hydrofera Blue Ready Transfer Foam, 2.5x2.5 (in/in) Discharge Instruction: Apply directly to wound bed as directed Secondary Dressing ALLEVYN Gentle Border, 3x3 (in/in) Discharge Instruction: Apply over primary dressing as directed. Optifoam Non-Adhesive Dressing, 4x4 in Discharge Instruction: Apply foam donut over primary dressing as directed. Secured With Compression Wrap Compression Stockings Environmental education officer) Signed: 06/06/2022 4:54:15 PM By: Adline Peals Entered By: Adline Peals on 06/06/2022  14:02:06 -------------------------------------------------------------------------------- Vitals Details Patient Name: Date of Service: [name], Holly RA H A. 06/06/2022 2:00 PM Medical Record Number: [id number] Patient Account Number: [id number] Date of Birth/Sex: Treating RN: November 07, 1950 (72 y.o. [name] Primary Care Diarra Kos: [name] Other Clinician: Referring Bertis Hustead: Treating Juanjesus Pepperman/Extender: [name]  [name] Weeks in [name]: [address], [place] A ([id number]) [id number] Page 7 of 7 Vital Signs Time Taken: 13:56 Temperature (F): 97.5 Pulse (bpm): 84 Respiratory Rate (breaths/min): 16 Blood Pressure (mmHg): 185/99 Reference Range: 80 - 120 mg / dl Electronic Signature(s) Signed: 06/06/2022 4:54:15 PM By: Adline Peals Entered By: Adline Peals on 06/06/2022 13:56:57

## 2022-06-08 DIAGNOSIS — Z780 Asymptomatic menopausal state: Secondary | ICD-10-CM | POA: Diagnosis not present

## 2022-06-09 DIAGNOSIS — E785 Hyperlipidemia, unspecified: Secondary | ICD-10-CM | POA: Diagnosis not present

## 2022-06-09 DIAGNOSIS — E1121 Type 2 diabetes mellitus with diabetic nephropathy: Secondary | ICD-10-CM | POA: Diagnosis not present

## 2022-06-09 DIAGNOSIS — F3341 Major depressive disorder, recurrent, in partial remission: Secondary | ICD-10-CM | POA: Diagnosis not present

## 2022-06-09 DIAGNOSIS — I129 Hypertensive chronic kidney disease with stage 1 through stage 4 chronic kidney disease, or unspecified chronic kidney disease: Secondary | ICD-10-CM | POA: Diagnosis not present

## 2022-06-10 DIAGNOSIS — Z89422 Acquired absence of other left toe(s): Secondary | ICD-10-CM | POA: Diagnosis not present

## 2022-06-10 DIAGNOSIS — D692 Other nonthrombocytopenic purpura: Secondary | ICD-10-CM | POA: Diagnosis not present

## 2022-06-10 DIAGNOSIS — Z89411 Acquired absence of right great toe: Secondary | ICD-10-CM | POA: Diagnosis not present

## 2022-06-10 DIAGNOSIS — I429 Cardiomyopathy, unspecified: Secondary | ICD-10-CM | POA: Diagnosis not present

## 2022-06-10 DIAGNOSIS — N1832 Chronic kidney disease, stage 3b: Secondary | ICD-10-CM | POA: Diagnosis not present

## 2022-06-10 DIAGNOSIS — E11621 Type 2 diabetes mellitus with foot ulcer: Secondary | ICD-10-CM | POA: Diagnosis not present

## 2022-06-10 DIAGNOSIS — F339 Major depressive disorder, recurrent, unspecified: Secondary | ICD-10-CM | POA: Diagnosis not present

## 2022-06-10 DIAGNOSIS — E1122 Type 2 diabetes mellitus with diabetic chronic kidney disease: Secondary | ICD-10-CM | POA: Diagnosis not present

## 2022-06-10 DIAGNOSIS — E1142 Type 2 diabetes mellitus with diabetic polyneuropathy: Secondary | ICD-10-CM | POA: Diagnosis not present

## 2022-06-10 DIAGNOSIS — G8334 Monoplegia, unspecified affecting left nondominant side: Secondary | ICD-10-CM | POA: Diagnosis not present

## 2022-06-10 DIAGNOSIS — L97521 Non-pressure chronic ulcer of other part of left foot limited to breakdown of skin: Secondary | ICD-10-CM | POA: Diagnosis not present

## 2022-06-19 DIAGNOSIS — F332 Major depressive disorder, recurrent severe without psychotic features: Secondary | ICD-10-CM | POA: Diagnosis not present

## 2022-06-27 ENCOUNTER — Encounter (HOSPITAL_BASED_OUTPATIENT_CLINIC_OR_DEPARTMENT_OTHER): Payer: PPO | Attending: Internal Medicine | Admitting: Internal Medicine

## 2022-06-27 DIAGNOSIS — L84 Corns and callosities: Secondary | ICD-10-CM | POA: Insufficient documentation

## 2022-06-27 DIAGNOSIS — L97522 Non-pressure chronic ulcer of other part of left foot with fat layer exposed: Secondary | ICD-10-CM | POA: Diagnosis not present

## 2022-06-27 DIAGNOSIS — Z794 Long term (current) use of insulin: Secondary | ICD-10-CM | POA: Insufficient documentation

## 2022-06-27 DIAGNOSIS — E11621 Type 2 diabetes mellitus with foot ulcer: Secondary | ICD-10-CM | POA: Diagnosis not present

## 2022-06-27 DIAGNOSIS — Z8542 Personal history of malignant neoplasm of other parts of uterus: Secondary | ICD-10-CM | POA: Insufficient documentation

## 2022-06-27 DIAGNOSIS — E1151 Type 2 diabetes mellitus with diabetic peripheral angiopathy without gangrene: Secondary | ICD-10-CM | POA: Diagnosis not present

## 2022-06-27 DIAGNOSIS — I129 Hypertensive chronic kidney disease with stage 1 through stage 4 chronic kidney disease, or unspecified chronic kidney disease: Secondary | ICD-10-CM | POA: Diagnosis not present

## 2022-06-27 DIAGNOSIS — Z89422 Acquired absence of other left toe(s): Secondary | ICD-10-CM | POA: Insufficient documentation

## 2022-06-27 DIAGNOSIS — E1140 Type 2 diabetes mellitus with diabetic neuropathy, unspecified: Secondary | ICD-10-CM | POA: Insufficient documentation

## 2022-06-27 DIAGNOSIS — Z8673 Personal history of transient ischemic attack (TIA), and cerebral infarction without residual deficits: Secondary | ICD-10-CM | POA: Insufficient documentation

## 2022-06-27 DIAGNOSIS — E1122 Type 2 diabetes mellitus with diabetic chronic kidney disease: Secondary | ICD-10-CM | POA: Diagnosis not present

## 2022-06-27 DIAGNOSIS — Z89411 Acquired absence of right great toe: Secondary | ICD-10-CM | POA: Diagnosis not present

## 2022-06-27 DIAGNOSIS — N183 Chronic kidney disease, stage 3 unspecified: Secondary | ICD-10-CM | POA: Insufficient documentation

## 2022-06-27 DIAGNOSIS — E1142 Type 2 diabetes mellitus with diabetic polyneuropathy: Secondary | ICD-10-CM | POA: Insufficient documentation

## 2022-06-27 NOTE — Progress Notes (Signed)
[name] ([id number]) [id number] Page 1 of 11 Visit Report for 06/27/2022 Chief Complaint Document Details Patient Name: Date of Service: [name]. 06/27/2022 3:00 PM Medical Record Number: [id number] Patient Account Number: [id number] Date of Birth/Sex: Treating RN: 07-09-1950 (72 y.o. F) Primary Care Provider: Laurann [name] Other Clinician: Referring Provider: Treating Provider/Extender: [name] in Treatment: 35 Information Obtained from: Patient Chief Complaint Left plantar ulcer 11/12/2019; patient is here referred by podiatry for review of [name] wound on the left first plantar metatarsal head 03/30/2021; right second toe wound s/p Trauma 10/21/2021; First left metatarsal head wound Electronic Signature(s) Signed: 06/27/2022 4:03:08 PM By: Holly Corwin DO Entered By: [name] on 06/27/2022 15:54:18 -------------------------------------------------------------------------------- Debridement Details Patient Name: Date of Service: [name], [name]. 06/27/2022 3:00 PM Medical Record Number: [id number] Patient Account Number: [id number] Date of Birth/Sex: Treating RN: October 23, 1950 (72 y.o. [name], Millard.Loa Primary Care Provider: Laurann [name] Other Clinician: Referring Provider: Treating Provider/Extender: [name] in Treatment: 35 Debridement Performed for Assessment: Wound #4 Left,Plantar Metatarsal head first Performed By: Physician Holly Corwin, DO Debridement Type: Debridement Severity of Tissue Pre Debridement: Fat layer exposed Level of Consciousness (Pre-procedure): Awake and Alert Pre-procedure Verification/Time Out Yes - 15:30 Taken: Start Time: 15:31 Pain Control: Lidocaine 4% T opical Solution T Area Debrided (L x W): otal 1 (cm) x 1 (cm) = 1 (cm) Tissue and other material debrided: Viable, Non-Viable, Callus, Slough, Subcutaneous, Skin: Dermis , Skin:  Epidermis, Slough Level: Skin/Subcutaneous Tissue Debridement Description: Excisional Instrument: Curette Bleeding: Minimum Hemostasis Achieved: Pressure End Time: 15:38 Procedural Pain: 0 Post Procedural Pain: 0 Response to Treatment: Procedure was tolerated well Level of Consciousness (Post- Awake and Alert procedure): Post Debridement Measurements of Total Wound Length: (cm) 0.6 Width: (cm) 0.4 Depth: (cm) 0.5 Volume: (cm) 0.094 Character of Wound/Ulcer Post Debridement: Requires Further Debridement Severity of Tissue Post Debridement: Fat layer exposed Post Procedure Diagnosis Same as Pre-procedure Electronic Signature(s) Signed: 06/27/2022 4:03:08 PM By: [name], [name] ([id number]) [id number] Page 2 of 11 Signed: 06/27/2022 5:03:18 PM By: Holly Stall RN, BSN Entered By: [name] on 06/27/2022 15:38:23 -------------------------------------------------------------------------------- HPI Details Patient Name: Date of Service: [name], [name]. 06/27/2022 3:00 PM Medical Record Number: [id number] Patient Account Number: [id number] Date of Birth/Sex: Treating RN: 1950/06/24 (72 y.o. F) Primary Care Provider: Laurann [name] Other Clinician: Referring Provider: Treating Provider/Extender: [name] in Treatment: 35 History of Present Illness HPI Description: 05/10/17 patient presents today for initial evaluation and our clinic concerning issues that she has been having with an ulceration on the plantar aspect of the left first metatarsal head. She has been seen [name] podiatrist Dr. Ardelle Anton for this since August 2018. Subsequently he has recommended bunion surgery as that seems to be the culprit for why there is pressure and friction occurring at the site causing the callous and subsequently the wound. 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 With that being said I do have notes from several of his visits one of which does include an x-ray documentation stating that she had [name] negative x-ray. There was obviously the bunion the notes I have extended from 11 deformity. With that being said she has not had an MRI fortunately this wound does not probe to bone.  The notes I have extended from February 08, 2017 through April 14, 2017. During that course she was also placed on antibiotics to help with an infection two weeks ago and this was doxycycline. Fortunately that seems to have completely resolved any infection issues that she may have had. Unfortunately this is an ulcer that she has actually been dealing with since April 2018. She just initially was trying to treat it and care for it on her own. Her most recent hemoglobin A1c was 5.8, she is [name] non-smoker, she does have [name] right first toe amputation and [name] left third toe amputation. Fortunately she's not having any discomfort at the site. 05/17/17 on evaluation today patient's ulcer on the plantar aspect of the first metatarsal region appears to be close at this point. There does not appear to be any evidence of ulcer opening. This is on initial inspection. She has not really had any discomfort but honestly she really was not experiencing [name] lot of pain even before. There is no evidence of infection or fluctuation under the callous which is present at this point. ADMISSION 11/12/2019 This is [name] 72 year old woman that we previously had in this clinic in 2017 for two visits with [name] wound I think roughly in the same area. At that point she had [name] bunion in the left. She has apparently had surgery on this since then. Looking through care everywhere we can see that she has had follow-up with podiatry from 06/12/2019 through 10/28/2019 for [name] wound on the plantar aspect of her left first MTP. She states when this started she was  having severe knee pain on the right which forced her to alter her gait. She has recently received doxycycline which she is finished and Bactroban. She is back to using Medihoney on the wound. She is offloading with the Pegasys shoe. Past medical history includes type 2 diabetes with [name] recent hemoglobin A1c of 6.8, severe type II diabetic neuropathy, history of [name] CVA, stage III chronic renal failure hypertension, depression, of bilateral previous bunion surgery, right first toe amputation and [name] partial left third toe amputation for osteomyelitis. The patient states that she recently had work to do in her mother's house in [place] and so she was on her feet quite [name] lot which delayed the healing. ABI in our clinic was 1.1 on the left 8/31; plantar aspect of her left first metatarsal head. Using silver collagen. This is probably [name] surgical wound 9/14; this is [name] patient who had [name] wound on the plantar aspect of her left first metatarsal head in the setting of [name] significant bunion deformity. We use cervical collagen and [name] offloading shoe. She has significant diabetic neuropathy probably some degree of [name] Charcot deformity in her feet. This is closed over today. Readmission 03/30/2021 [name] is [name] 72 year old female with [name] past medical history of insulin-dependent controlled type 2 diabetes s/p right great toe amputation, right knee osteoarthritis and hypertension that presents to the clinic for [name] second right toe wound. She states she was getting out of the bathtub when her foot got caught the tub and the skin to her second toe was split open. She noticed this after the bath when she saw blood on the floor. She has neuropathy in her feet. She visited the ED following the event on 03/26/2021. They obtained x-rays that showed [name] fracture to the fifth toe but no fracture to the second toe. She was started on doxycycline. She currently denies signs of infection.  She has been keeping the area covered  with [name] Band-Aid. Readmission 10/21/2021 [name] is [name] 72 year old female with [name] past medical history of insulin dependent controlled type 2 diabetes complicated by peripheral neuropathy that presents to the clinic for [name] left met head wound. She has been following with Dr. Loreta Ave, podiatry for over [name] year for this issue. She has tried collagen, mupirocin ointment and Medihoney. She is at high fall risk and cannot tolerate [name] total contact cast. She has orthotics with specialized inserts for her current issue. She currently denies signs of infection. 8/10; patient presents for follow-up. Unfortunately Regranex was unaffordable at $1200. She has been using Medihoney to the wound bed. She reports aggressively offloading the area. She has offloading inserts. She denies signs of infection. 8/22; patient presents for follow-up. She has been using Medihoney to the wound bed with improvement in wound healing. She has no issues or complaints today. She will be out of town starting next week for the next 2 weeks. 9/11; patient presents for follow-up. She has been using Medihoney to the wound bed. She went on vacation and was last seen 3 weeks ago. She states she had tried to aggressively offload the wound bed. 9/26; patient presents for follow-up. She has been using Medihoney to the wound bed. She has no issues or complaints today. 10/13; patient presents for follow-up. She has been using Medihoney to the wound bed. She has no issues or complaints today. We rediscussed the possibility of [name] total contact cast but she does not feel comfortable as she is [name] high fall risk and lives alone. 10/26; patient presents for follow-up. Has been using Medihoney to the wound bed. She has been approved for Grafix however has [name] 20% co-pay. Patient declined proceeding with this due to financial cost. Patient has no issues or complaints today. 11/16; patient presents for follow-up. She has been using Medihoney to the wound  bed. She has been using her diabetic shoes with an offloading foam donut. She declines total contact cast placement today. 11/30; patient presents for follow-up. She has been using Medihoney to the wound bed. She did not receive silver alginate. She has been using the Pegasys with surgical shoe. There has been improvement in wound healing. AYDEE, MCNEW [name] ([id number]) [id number] Page 3 of 11 12/8; patient presents for follow-up. She has been using Medihoney and silver alginate to the wound bed. She has been using the Pegasys with surgical shoe without issues. 12/18; patient presents for follow-up. She has been using Medihoney and silver alginate to the wound bed. She continues to use her Pegasys with [name] surgical shoe. She has no issues or complaints today. 12/28; patient presents for follow-up. She has been using Medihoney and silver alginate to the wound bed. She continues to use her peg assist with [name] surgical shoe. She states she tried to aggressively offload the wound bed this week. She has no issues or complaints today. 1/18; patient missed her last clinic appointment due to conjunctivitis. She has been using Medihoney and silver alginate to the wound bed. She continues to use her peg assist with her surgical shoe. 1/23; patient presents for follow-up. She has been using blast X and Hydrofera Blue to the wound bed. She uses her peg assist with her surgical shoe. She has no issues or complaints today. 1/30; patient presents for follow-up. She has been using blast X and Hydrofera Blue to the wound bed. She continues to use her peg assist with her surgical  shoe. She has no issues or complaints today. She has not received her blast X but still has ointment and the samples that were given to her. 2/6; patient presents for follow-up. She is been using blast X and Hydrofera Blue to the wound bed. She continues to use her Pegasys with her surgical shoe for offloading. She  has no issues or complaints today. 3/5; patient missed her last clinic appointment. It has been 1 month since last seen the patient. She has been using blast X and Hydrofera Blue to the wound bed. She continues with her peg assist and surgical shoe for offloading. She denies signs of infection. 3/18; patient presents for follow-up. She has been using blast X and Hydrofera Blue to the wound bed. She has no issues or complaints today. 4/8; patient presents for follow-up. Has been using Lasix and Hydrofera Blue to the wound bed. She went on vacation over the past several weeks. She has more callus buildup. Offered the total contact cast however she declined. Electronic Signature(s) Signed: 06/27/2022 4:03:08 PM By: Holly Corwin DO Entered By: [name] on 06/27/2022 15:55:08 -------------------------------------------------------------------------------- Physical Exam Details Patient Name: Date of Service: [name], [name]. 06/27/2022 3:00 PM Medical Record Number: [id number] Patient Account Number: [id number] Date of Birth/Sex: Treating RN: 1950/06/26 (72 y.o. F) Primary Care Provider: Laurann [name] Other Clinician: Referring Provider: Treating Provider/Extender: [name] in Treatment: 35 Constitutional respirations regular, non-labored and within target range for patient.. Cardiovascular 2+ dorsalis pedis/posterior tibialis pulses. Psychiatric pleasant and cooperative. Notes Left foot: T the left first metatarsal there is an open wound with granulation tissue, callus and nonviable tissue. Postdebridement there is healthy granulation o tissue. No surrounding signs of infection. Electronic Signature(s) Signed: 06/27/2022 4:03:08 PM By: Holly Corwin DO Entered By: [name] on 06/27/2022 15:56:42 -------------------------------------------------------------------------------- Physician Orders Details Patient Name: Date of Service: [name], [name]. 06/27/2022 3:00 PM Medical Record Number: [id number] Patient Account Number: [id number] Date of Birth/Sex: Treating RN: [birth year] (72 y.o. [name], [name] Primary Care Provider: Laurann [name] Other Clinician: Referring Provider: Treating Provider/Extender: [name] in Treatment: 22 Verbal / Phone Orders: No Diagnosis Coding NORMANDIE, BLANKS [name] ([id number]) [id number] Page 4 of 11 ICD-10 Coding Code Description [phone] Non-pressure chronic ulcer of other part of left foot with fat layer exposed E11.621 Type 2 diabetes mellitus with foot ulcer E11.42 Type 2 diabetes mellitus with diabetic polyneuropathy Follow-up Appointments ppointment in 2 weeks. - Dr. Mikey Bussing 3pm 07/11/2022 MOnday room 8 Return [name] Other: - Prism=Supplies ***Patient to consider calling and schedule see your podiatrist about other options for wound closure.**** Cellular or Tissue Based Products Cellular or Tissue Based Product Type: - IVR for Grafix IVR for [employer]=$225 Copay Bathing/ Shower/ Hygiene May shower and wash wound with soap and water. Negative Presssure Wound Therapy Wound Vac to wound continuously at 176mm/hg pressure - Run IVR for SNAP VAC Edema Control - Lymphedema / SCD / Other Avoid standing for long periods of time. Off-Loading Open toe surgical shoe to: - peg assist Additional Orders / Instructions Follow Nutritious Diet - Monitor/Control Blood Sugars Non Wound Condition Other Non Wound Condition Orders/Instructions: - Apply corn pad or gauze between great toe and 2nd toe. Wound Treatment Wound #4 - Metatarsal head first Wound Laterality: Plantar, Left Cleanser: Soap and Water 1 x Per Day/30 Days Discharge Instructions: May shower and wash wound with dial antibacterial soap and water prior to dressing change. Cleanser: Wound Cleanser (  Generic) 1 x Per Day/30 Days Discharge Instructions: Cleanse the wound with wound  cleanser prior to applying [name] clean dressing using gauze sponges, not tissue or cotton balls. Peri-Wound Care: Skin Prep (Generic) 1 x Per Day/30 Days Discharge Instructions: Use skin prep as directed Topical: BlastX 1 x Per Day/30 Days Prim Dressing: Hydrofera Blue Ready Transfer Foam, 2.5x2.5 (in/in) 1 x Per Day/30 Days ary Discharge Instructions: Apply directly to wound bed as directed Secondary Dressing: ALLEVYN Gentle Border, 3x3 (in/in) (Generic) 1 x Per Day/30 Days Discharge Instructions: Apply over primary dressing as directed. Secondary Dressing: Optifoam Non-Adhesive Dressing, 4x4 in 1 x Per Day/30 Days Discharge Instructions: Apply foam donut over primary dressing as directed. Electronic Signature(s) Signed: 06/27/2022 4:03:08 PM By: Holly Corwin DO Entered By: [name] on 06/27/2022 15:56:51 -------------------------------------------------------------------------------- Problem List Details Patient Name: Date of Service: [name], [name]. 06/27/2022 3:00 PM Medical Record Number: [id number] Patient Account Number: [id number] Date of Birth/Sex: Treating RN: [birth year] (72 y.o. [name], [name] Primary Care Provider: Laurann [name] Other Clinician: Referring Provider: Treating Provider/Extender: [name] in Treatment: [address] [place], [place] [name] ([id number]) [id number] Page 5 of 11 ICD-10 Encounter Code Description Active Date MDM Diagnosis L97.522 Non-pressure chronic ulcer of other part of left foot with fat layer exposed 10/21/2021 No Yes E11.621 Type 2 diabetes mellitus with foot ulcer 10/21/2021 No Yes E11.42 Type 2 diabetes mellitus with diabetic polyneuropathy 10/21/2021 No Yes Inactive Problems Resolved Problems Electronic Signature(s) Signed: 06/27/2022 4:03:08 PM By: Holly Corwin DO Entered By: [name] on 06/27/2022  15:54:05 -------------------------------------------------------------------------------- Progress Note Details Patient Name: Date of Service: [name], [name]. 06/27/2022 3:00 PM Medical Record Number: [id number] Patient Account Number: [id number] Date of Birth/Sex: Treating RN: [birth year] (72 y.o. F) Primary Care Provider: Laurann [name] Other Clinician: Referring Provider: Treating Provider/Extender: [name] in Treatment: 35 Subjective Chief Complaint Information obtained from Patient Left plantar ulcer 11/12/2019; patient is here referred by podiatry for review of [name] wound on the left first plantar metatarsal head 03/30/2021; right second toe wound s/p Trauma 10/21/2021; First left metatarsal head wound History of Present Illness (HPI) 05/10/17 patient presents today for initial evaluation and our clinic concerning issues that she has been having with an ulceration on the plantar aspect of the left first metatarsal head. She has been seen [name] podiatrist Dr. Ardelle Anton for this since August 2018. Subsequently he has recommended bunion surgery as that seems to be the culprit for why there is pressure and friction occurring at the site causing the callous and subsequently the wound. 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 With that being said I do have notes from several of his visits one of which does include an x-ray documentation stating that she had [name] negative x-ray. There was obviously the bunion the notes I have extended from 11 deformity. With that being said she has not had an MRI fortunately this wound does not probe to bone. The notes I have extended from February 08, 2017 through April 14, 2017. During that course she was also placed on antibiotics to help with an infection two weeks ago and this was doxycycline.  Fortunately that seems to have completely resolved any infection issues that she may have had. Unfortunately this is an ulcer that she has actually been dealing with  since April 2018. She just initially was trying to treat it and care for it on her own. Her most recent hemoglobin A1c was 5.8, she is [name] non-smoker, she does have [name] right first toe amputation and [name] left third toe amputation. Fortunately she's not having any discomfort at the site. 05/17/17 on evaluation today patient's ulcer on the plantar aspect of the first metatarsal region appears to be close at this point. There does not appear to be any evidence of ulcer opening. This is on initial inspection. She has not really had any discomfort but honestly she really was not experiencing [name] lot of pain even before. There is no evidence of infection or fluctuation under the callous which is present at this point. ADMISSION 11/12/2019 This is [name] 72 year old woman that we previously had in this clinic in 2017 for two visits with [name] wound I think roughly in the same area. At that point she had [name] bunion in the left. She has apparently had surgery on this since then. Looking through care everywhere we can see that she has had follow-up with podiatry from 06/12/2019 through 10/28/2019 for [name] wound on the plantar aspect of her left first MTP. She states when this started she was having severe knee pain on the right which forced her to alter her gait. She has recently received doxycycline which she is finished and Bactroban. She is back to using Medihoney on the wound. She is offloading with the Pegasys shoe. Past medical history includes type 2 diabetes with [name] recent hemoglobin A1c of 6.8, severe type II diabetic neuropathy, history of [name] CVA, stage III chronic renal failure hypertension, depression, of bilateral previous bunion surgery, right first toe amputation and [name] partial left third toe amputation for osteomyelitis. The patient states that she recently had  work to do in her mother's house in [place] and so she was on her feet quite [name] lot which delayed the healing. ABI in our clinic was 1.1 on the left 8/31; plantar aspect of her left first metatarsal head. Using silver collagen. This is probably [name] surgical wound 9/14; this is [name] patient who had [name] wound on the plantar aspect of her left first metatarsal head in the setting of [name] significant bunion deformity. We use cervical collagen and [name] offloading shoe. She has significant diabetic neuropathy probably some degree of [name] Charcot deformity in her feet. This is closed over today. Readmission 03/30/2021 [name] is [name] 72 year old female with [name] past medical history of insulin-dependent controlled type 2 diabetes s/p right great toe amputation, right knee osteoarthritis and hypertension that presents to the clinic for [name] second right toe wound. She states she was getting out of the bathtub when her foot got caught NEIVA, MAINI [name] ([id number]) [id number] Page 6 of 11 the tub and the skin to her second toe was split open. She noticed this after the bath when she saw blood on the floor. She has neuropathy in her feet. She visited the ED following the event on 03/26/2021. They obtained x-rays that showed [name] fracture to the fifth toe but no fracture to the second toe. She was started on doxycycline. She currently denies signs of infection. She has been keeping the area covered with [name] Band-Aid. Readmission 10/21/2021 [name] is [name] 72 year old female with [name] past medical history of insulin dependent controlled type 2 diabetes complicated by peripheral neuropathy that presents to the clinic for [name] left met head wound. She has been following with Dr.  Loreta Ave, podiatry for over [name] year for this issue. She has tried collagen, mupirocin ointment and Medihoney. She is at high fall risk and cannot tolerate [name] total contact cast. She has orthotics with specialized inserts for her  current issue. She currently denies signs of infection. 8/10; patient presents for follow-up. Unfortunately Regranex was unaffordable at $1200. She has been using Medihoney to the wound bed. She reports aggressively offloading the area. She has offloading inserts. She denies signs of infection. 8/22; patient presents for follow-up. She has been using Medihoney to the wound bed with improvement in wound healing. She has no issues or complaints today. She will be out of town starting next week for the next 2 weeks. 9/11; patient presents for follow-up. She has been using Medihoney to the wound bed. She went on vacation and was last seen 3 weeks ago. She states she had tried to aggressively offload the wound bed. 9/26; patient presents for follow-up. She has been using Medihoney to the wound bed. She has no issues or complaints today. 10/13; patient presents for follow-up. She has been using Medihoney to the wound bed. She has no issues or complaints today. We rediscussed the possibility of [name] total contact cast but she does not feel comfortable as she is [name] high fall risk and lives alone. 10/26; patient presents for follow-up. Has been using Medihoney to the wound bed. She has been approved for Grafix however has [name] 20% co-pay. Patient declined proceeding with this due to financial cost. Patient has no issues or complaints today. 11/16; patient presents for follow-up. She has been using Medihoney to the wound bed. She has been using her diabetic shoes with an offloading foam donut. She declines total contact cast placement today. 11/30; patient presents for follow-up. She has been using Medihoney to the wound bed. She did not receive silver alginate. She has been using the Pegasys with surgical shoe. There has been improvement in wound healing. 12/8; patient presents for follow-up. She has been using Medihoney and silver alginate to the wound bed. She has been using the Pegasys with surgical  shoe without issues. 12/18; patient presents for follow-up. She has been using Medihoney and silver alginate to the wound bed. She continues to use her Pegasys with [name] surgical shoe. She has no issues or complaints today. 12/28; patient presents for follow-up. She has been using Medihoney and silver alginate to the wound bed. She continues to use her peg assist with [name] surgical shoe. She states she tried to aggressively offload the wound bed this week. She has no issues or complaints today. 1/18; patient missed her last clinic appointment due to conjunctivitis. She has been using Medihoney and silver alginate to the wound bed. She continues to use her peg assist with her surgical shoe. 1/23; patient presents for follow-up. She has been using blast X and Hydrofera Blue to the wound bed. She uses her peg assist with her surgical shoe. She has no issues or complaints today. 1/30; patient presents for follow-up. She has been using blast X and Hydrofera Blue to the wound bed. She continues to use her peg assist with her surgical shoe. She has no issues or complaints today. She has not received her blast X but still has ointment and the samples that were given to her. 2/6; patient presents for follow-up. She is been using blast X and Hydrofera Blue to the wound bed. She continues to use her Pegasys with her surgical shoe for offloading. She has no issues  or complaints today. 3/5; patient missed her last clinic appointment. It has been 1 month since last seen the patient. She has been using blast X and Hydrofera Blue to the wound bed. She continues with her peg assist and surgical shoe for offloading. She denies signs of infection. 3/18; patient presents for follow-up. She has been using blast X and Hydrofera Blue to the wound bed. She has no issues or complaints today. 4/8; patient presents for follow-up. Has been using Lasix and Hydrofera Blue to the wound bed. She went on vacation over the past several  weeks. She has more callus buildup. Offered the total contact cast however she declined. Patient History Information obtained from Patient. Family History Cancer - Father, Hypertension - Mother,Father,Siblings, Stroke - Mother,Father, No family history of Diabetes, Heart Disease, Hereditary Spherocytosis, Kidney Disease, Lung Disease, Seizures, Thyroid Problems, Tuberculosis. Social History Never smoker, Marital Status - Single, Alcohol Use - Never, Drug Use - No History, Caffeine Use - Rarely. Medical History Eyes Patient has history of Cataracts Denies history of Glaucoma, Optic Neuritis Ear/Nose/Mouth/Throat Denies history of Chronic sinus problems/congestion, Middle ear problems Hematologic/Lymphatic Denies history of Anemia, Hemophilia, Human Immunodeficiency Virus, Lymphedema, Sickle Cell Disease Respiratory Patient has history of Sleep Apnea Denies history of Aspiration, Asthma, Chronic Obstructive Pulmonary Disease (COPD), Pneumothorax, Tuberculosis Cardiovascular Patient has history of Hypertension Denies history of Angina, Arrhythmia, Congestive Heart Failure, Coronary Artery Disease, Deep Vein Thrombosis, Hypotension, Myocardial Infarction, Peripheral Arterial Disease, Peripheral Venous Disease, Phlebitis, Vasculitis Gastrointestinal Denies history of Cirrhosis , Colitis, Crohnoos, Hepatitis [name], Hepatitis B, Hepatitis C Endocrine Patient has history of Type II Diabetes [name], [name] ([id number]) [id number] Page 7 of 11 Denies history of Type I Diabetes Genitourinary Denies history of End Stage Renal Disease Immunological Denies history of Lupus Erythematosus, Raynaudoos, Scleroderma Integumentary (Skin) Denies history of History of Burn Musculoskeletal Patient has history of Osteoarthritis, Osteomyelitis - Hx in 2007 (left 3rd toe) Denies history of Gout, Rheumatoid Arthritis Neurologic Patient has history of Neuropathy Denies  history of Dementia, Quadriplegia, Paraplegia, Seizure Disorder Oncologic Patient has history of Received Radiation - 03/2020 Denies history of Received Chemotherapy Psychiatric Patient has history of Confinement Anxiety Denies history of Anorexia/bulimia Hospitalization/Surgery History - Amputation of right great toe. - Tendon repair. - Hysterectomy. - Osteomyelitis left 3rd toe. - cervical fusion with cage. - left breast lumpectomy. Medical [name] Surgical History Notes nd Constitutional Symptoms (General Health) morbid obesity Cardiovascular Takotsubo cardiomyopathy 2012, hyperlipidemia, Hx CVA, Tachycardia Genitourinary CKD stage 3 Musculoskeletal left leg weakness Neurologic CVA Oncologic hx endometrial cancer, left breast CA Psychiatric depression Objective Constitutional respirations regular, non-labored and within target range for patient.. Vitals Time Taken: 3:10 PM, Temperature: 98 F, Pulse: 80 bpm, Respiratory Rate: 20 breaths/min, Blood Pressure: 159/83 mmHg. Cardiovascular 2+ dorsalis pedis/posterior tibialis pulses. Psychiatric pleasant and cooperative. General Notes: Left foot: T the left first metatarsal there is an open wound with granulation tissue, callus and nonviable tissue. Postdebridement there is o healthy granulation tissue. No surrounding signs of infection. Integumentary (Hair, Skin) Wound #4 status is Open. Original cause of wound was Gradually Appeared. The date acquired was: 10/02/2020. The wound has been in treatment 35 weeks. The wound is located on the Left,Plantar Metatarsal head first. The wound measures 0.6cm length x 0.4cm width x 0.5cm depth; 0.188cm^2 area and 0.094cm^3 volume. There is Fat Layer (Subcutaneous Tissue) exposed. There is no tunneling noted, however, there is undermining starting at 6:00 and ending at 12:00 with [name] maximum distance of 0.7cm. There is  [name] medium amount of serosanguineous drainage noted. The wound margin is distinct  with the outline attached to the wound base. There is large (67-100%) pink, pale granulation within the wound bed. There is no necrotic tissue within the wound bed. The periwound skin appearance exhibited: Callus, Dry/Scaly. The periwound skin appearance did not exhibit: Crepitus, Excoriation, Induration, Rash, Scarring, Maceration, Atrophie Blanche, Cyanosis, Ecchymosis, Hemosiderin Staining, Mottled, Pallor, Rubor, Erythema. Periwound temperature was noted as No Abnormality. Assessment Active Problems ICD-10 Non-pressure chronic ulcer of other part of left foot with fat layer exposed Type 2 diabetes mellitus with foot ulcer Type 2 diabetes mellitus with diabetic polyneuropathy [name], [name] ([id number]) [id number] Page 8 of 11 I patient's wound has declined in size and appearance. She was putting more pressure to the wound site while being on vacation. She is using [name] surgical shoe with peg assist. I debrided nonviable tissue and recommended continuing with blast X and Hydrofera Blue. At this time I recommended following back up with podiatry to discuss surgical options for her wound healing. She has [name] deformity to her foot and patient states that shaving the bone down has been discussed as an option. Follow-up in 2 weeks. Procedures Wound #4 Pre-procedure diagnosis of Wound #4 is [name] Diabetic Wound/Ulcer of the Lower Extremity located on the Left,Plantar Metatarsal head first .Severity of Tissue Pre Debridement is: Fat layer exposed. There was [name] Excisional Skin/Subcutaneous Tissue Debridement with [name] total area of 1 sq cm performed by Holly Corwin, DO. With the following instrument(s): Curette to remove Viable and Non-Viable tissue/material. Material removed includes Callus, Subcutaneous Tissue, Slough, Skin: Dermis, and Skin: Epidermis after achieving pain control using Lidocaine 4% Topical Solution. [name] time out was conducted at 15:30, prior to the start of the  procedure. [name] Minimum amount of bleeding was controlled with Pressure. The procedure was tolerated well with [name] pain level of 0 throughout and [name] pain level of 0 following the procedure. Post Debridement Measurements: 0.6cm length x 0.4cm width x 0.5cm depth; 0.094cm^3 volume. Character of Wound/Ulcer Post Debridement requires further debridement. Severity of Tissue Post Debridement is: Fat layer exposed. Post procedure Diagnosis Wound #4: Same as Pre-Procedure Plan Follow-up Appointments: Return Appointment in 2 weeks. - Dr. Mikey Bussing 3pm 07/11/2022 MOnday room 8 Other: - Prism=Supplies ***Patient to consider calling and schedule see your podiatrist about other options for wound closure.**** Cellular or Tissue Based Products: Cellular or Tissue Based Product Type: - IVR for Grafix IVR for [employer]=$225 Copay Bathing/ Shower/ Hygiene: May shower and wash wound with soap and water. Negative Presssure Wound Therapy: Wound Vac to wound continuously at 176mm/hg pressure - Run IVR for SNAP VAC Edema Control - Lymphedema / SCD / Other: Avoid standing for long periods of time. Off-Loading: Open toe surgical shoe to: - peg assist Additional Orders / Instructions: Follow Nutritious Diet - Monitor/Control Blood Sugars Non Wound Condition: Other Non Wound Condition Orders/Instructions: - Apply corn pad or gauze between great toe and 2nd toe. WOUND #4: - Metatarsal head first Wound Laterality: Plantar, Left Cleanser: Soap and Water 1 x Per Day/30 Days Discharge Instructions: May shower and wash wound with dial antibacterial soap and water prior to dressing change. Cleanser: Wound Cleanser (Generic) 1 x Per Day/30 Days Discharge Instructions: Cleanse the wound with wound cleanser prior to applying [name] clean dressing using gauze sponges, not tissue or cotton balls. Peri-Wound Care: Skin Prep (Generic) 1 x Per Day/30 Days Discharge Instructions: Use skin prep as directed Topical: BlastX 1 x Per Day/30  Days Prim Dressing: Hydrofera Blue Ready Transfer Foam, 2.5x2.5 (in/in) 1 x Per Day/30 Days ary Discharge Instructions: Apply directly to wound bed as directed Secondary Dressing: ALLEVYN Gentle Border, 3x3 (in/in) (Generic) 1 x Per Day/30 Days Discharge Instructions: Apply over primary dressing as directed. Secondary Dressing: Optifoam Non-Adhesive Dressing, 4x4 in 1 x Per Day/30 Days Discharge Instructions: Apply foam donut over primary dressing as directed. 1. In office sharp debridement 2. Hydrofera Blue and blast X 3. Follow back up with podiatry 4. Follow-up in 2 weeks Electronic Signature(s) Signed: 06/27/2022 4:03:08 PM By: Holly Corwin DO Entered By: [name] on 06/27/2022 15:58:40 -------------------------------------------------------------------------------- HxROS Details Patient Name: Date of Service: [name], [name]. 06/27/2022 3:00 PM Medical Record Number: [id number] Patient Account Number: [id number] Date of Birth/Sex: Treating RN: 1951/01/27 (72 y.o. F) Primary Care Provider: Laurann [name] Other Clinician: Referring Provider: Treating Provider/Extender: [name] in Treatment: [id number] Lahela [name] ([id number]) [id number] Page 9 of 11 Information Obtained From Patient Constitutional Symptoms (General Health) Medical History: Past Medical History Notes: morbid obesity Eyes Medical History: Positive for: Cataracts Negative for: Glaucoma; Optic Neuritis Ear/Nose/Mouth/Throat Medical History: Negative for: Chronic sinus problems/congestion; Middle ear problems Hematologic/Lymphatic Medical History: Negative for: Anemia; Hemophilia; Human Immunodeficiency Virus; Lymphedema; Sickle Cell Disease Respiratory Medical History: Positive for: Sleep Apnea Negative for: Aspiration; Asthma; Chronic Obstructive Pulmonary Disease (COPD); Pneumothorax; Tuberculosis Cardiovascular Medical  History: Positive for: Hypertension Negative for: Angina; Arrhythmia; Congestive Heart Failure; Coronary Artery Disease; Deep Vein Thrombosis; Hypotension; Myocardial Infarction; Peripheral Arterial Disease; Peripheral Venous Disease; Phlebitis; Vasculitis Past Medical History Notes: Takotsubo cardiomyopathy 2012, hyperlipidemia, Hx CVA, Tachycardia Gastrointestinal Medical History: Negative for: Cirrhosis ; Colitis; Crohns; Hepatitis [name]; Hepatitis B; Hepatitis C Endocrine Medical History: Positive for: Type II Diabetes Negative for: Type I Diabetes Time with diabetes: over 20 years Treated with: Insulin, Oral agents Blood sugar tested every day: Yes Tested : daily Genitourinary Medical History: Negative for: End Stage Renal Disease Past Medical History Notes: CKD stage 3 Immunological Medical History: Negative for: Lupus Erythematosus; Raynauds; Scleroderma Integumentary (Skin) Medical History: Negative for: History of Burn Musculoskeletal Medical History: Positive for: Osteoarthritis; Osteomyelitis - Hx in 2007 (left 3rd toe) Negative for: Gout; Rheumatoid Arthritis Past Medical History Notes: left leg weakness [name], [name] ([id number]) [id number] Page 10 of 11 Neurologic Medical History: Positive for: Neuropathy Negative for: Dementia; Quadriplegia; Paraplegia; Seizure Disorder Past Medical History Notes: CVA Oncologic Medical History: Positive for: Received Radiation - 03/2020 Negative for: Received Chemotherapy Past Medical History Notes: hx endometrial cancer, left breast CA Psychiatric Medical History: Positive for: Confinement Anxiety Negative for: Anorexia/bulimia Past Medical History Notes: depression HBO Extended History Items Eyes: Cataracts Immunizations Pneumococcal Vaccine: Received Pneumococcal Vaccination: Yes Received Pneumococcal Vaccination On or After 60th Birthday: Yes Implantable  Devices None Hospitalization / Surgery History Type of Hospitalization/Surgery Amputation of right great toe Tendon repair Hysterectomy Osteomyelitis left 3rd toe cervical fusion with cage left breast lumpectomy Family and Social History Cancer: Yes - Father; Diabetes: No; Heart Disease: No; Hereditary Spherocytosis: No; Hypertension: Yes - Mother,Father,Siblings; Kidney Disease: No; Lung Disease: No; Seizures: No; Stroke: Yes - Mother,Father; Thyroid Problems: No; Tuberculosis: No; Never smoker; Marital Status - Single; Alcohol Use: Never; Drug Use: No History; Caffeine Use: Rarely; Financial Concerns: No; Food, Clothing or Shelter Needs: No; Support System Lacking: No; Transportation Concerns: No Electronic Signature(s) Signed: 06/27/2022 4:03:08 PM By: Holly Corwin DO Entered By: [name] on 06/27/2022 15:55:17 -------------------------------------------------------------------------------- SuperBill Details Patient Name: Date of Service:  [name], [name]. 06/27/2022 Medical Record Number: [id number] Patient Account Number: [id number] Date of Birth/Sex: Treating RN: 07/26/1950 (72 y.o. Arta Silence Primary Care Provider: Laurann [name] Other Clinician: Referring Provider: Treating Provider/Extender: Willene Hatchet Weeks in Treatment: 35 Diagnosis Coding ICD-10 Codes Code Description [phone] Non-pressure chronic ulcer of other part of left foot with fat layer exposed E11.621 Type 2 diabetes mellitus with foot ulcer E11.42 Type 2 diabetes mellitus with diabetic polyneuropathy [name], [name] ([id number]) [id number] Page 11 of 11 Facility Procedures : CPT4 Code: [id number] Description: 11042 - DEB SUBQ TISSUE 20 SQ CM/< ICD-10 Diagnosis Description L97.522 Non-pressure chronic ulcer of other part of left foot with fat layer exposed E11.621 Type 2 diabetes mellitus with foot ulcer Modifier: Quantity: 1 Physician  Procedures : CPT4 Code Description Modifier [id number] 11042 - WC PHYS SUBQ TISS 20 SQ CM ICD-10 Diagnosis Description L97.522 Non-pressure chronic ulcer of other part of left foot with fat layer exposed E11.621 Type 2 diabetes mellitus with foot ulcer Quantity: 1 Electronic Signature(s) Signed: 06/27/2022 4:03:08 PM By: Holly Corwin DO Entered By: [name] on 06/27/2022 15:58:50

## 2022-06-27 NOTE — Progress Notes (Signed)
ALKA, GUTTIERREZ [name] ([id number]) [id number] Page 1 of 7 Visit Report for 06/27/2022 Arrival Information Details Patient Name: Date of Service: [name]. 06/27/2022 3:00 PM Medical Record Number: [id number] Patient Account Number: [id number] Date of Birth/Sex: Treating RN: 1951-03-06 (72 y.o. Debara Pickett, Yvonne Kendall Primary Care Fionnuala Hemmerich: Laurann Montana Other Clinician: Referring Xiong Haidar: Treating Sherie Dobrowolski/Extender: Dorann Ou in Treatment: 35 Visit Information History Since Last Visit Added or deleted any medications: No Patient Arrived: Ambulatory Any new allergies or adverse reactions: No Arrival Time: 15:10 Had [name] fall or experienced change in No Accompanied By: self activities of daily living that may affect Transfer Assistance: None risk of falls: Patient Identification Verified: Yes Signs or symptoms of abuse/neglect since No Secondary Verification Process Completed: Yes last visito Patient Requires Transmission-Based Precautions: No Hospitalized since last visit: No Patient Has Alerts: Yes Implantable device outside of the clinic No Patient Alerts: ABI's R=1.07 L=1.02 excluding cellular tissue based products placed in the center since last visit: Has Dressing in Place as Prescribed: Yes Has Footwear/Offloading in Place as Yes Prescribed: Left: Surgical Shoe with Pressure Relief Insole Pain Present Now: No Electronic Signature(s) Signed: 06/27/2022 5:03:18 PM By: Shawn Stall RN, BSN Entered By: Shawn Stall on 06/27/2022 15:10:57 -------------------------------------------------------------------------------- Encounter Discharge Information Details Patient Name: Date of Service: Dionne Bucy, [name] 06/27/2022 3:00 PM Medical Record Number: [id number] Patient Account Number: [id number] Date of Birth/Sex: Treating RN: [birth year] (72 y.o. Debara Pickett, Yvonne Kendall Primary Care Starsky Nanna: Laurann Montana Other Clinician: Referring  Caprina Wussow: Treating Marnae Madani/Extender: Dorann Ou in Treatment: 46 Encounter Discharge Information Items Post Procedure Vitals Discharge Condition: Stable Temperature (F): 98 Ambulatory Status: Cane Pulse (bpm): 80 Discharge Destination: Home Respiratory Rate (breaths/min): 20 Transportation: Private Auto Blood Pressure (mmHg): 159/83 Accompanied By: self Schedule Follow-up Appointment: Yes Clinical Summary of Care: Electronic Signature(s) Signed: 06/27/2022 5:03:18 PM By: Shawn Stall RN, BSN Entered By: Shawn Stall on 06/27/2022 15:41:51 -------------------------------------------------------------------------------- Lower Extremity Assessment Details Patient Name: Date of Service: Dionne Bucy, [name] 06/27/2022 3:00 PM Medical Record Number: [id number] Patient Account Number: [id number] Date of Birth/Sex: Treating RN: 10-07-1950 (72 y.o. Arta Silence Primary Care Susi Goslin: Laurann Montana Other Clinician: Referring Zolton Dowson: Treating Karlene Southard/Extender: Willene Hatchet [place], [place] [name] ([id number]) [id number] Page 2 of 7 Weeks in Treatment: 35 Edema Assessment Assessed: [Left: Yes] [Right: No] Edema: [Left: N] [Right: o] Calf Left: Right: Point of Measurement: 34 cm From Medial Instep 44 cm Ankle Left: Right: Point of Measurement: 9 cm From Medial Instep 28 cm Vascular Assessment Pulses: Dorsalis Pedis Palpable: [Left:Yes] Electronic Signature(s) Signed: 06/27/2022 5:03:18 PM By: Shawn Stall RN, BSN Entered By: Shawn Stall on 06/27/2022 15:12:52 -------------------------------------------------------------------------------- Multi Wound Chart Details Patient Name: Date of Service: Dionne Bucy, [name] 06/27/2022 3:00 PM Medical Record Number: [id number] Patient Account Number: [id number] Date of Birth/Sex: Treating RN: Dec 03, 1950 (72 y.o. F) Primary Care Lashunta Frieden: Laurann Montana Other  Clinician: Referring Aniruddh Ciavarella: Treating Tyjuan Demetro/Extender: Dorann Ou in Treatment: 35 Vital Signs Height(in): Pulse(bpm): 80 Weight(lbs): Blood Pressure(mmHg): 159/83 Body Mass Index(BMI): Temperature(F): 98 Respiratory Rate(breaths/min): 20 [4:Photos:] [N/[name]:N/[name]] Left, Plantar Metatarsal head first N/[name] N/[name] Wound Location: Gradually Appeared N/[name] N/[name] Wounding Event: Diabetic Wound/Ulcer of the Lower N/[name] N/[name] Primary Etiology: Extremity Cataracts, Sleep Apnea, Hypertension, N/[name] N/[name] Comorbid History: Type II Diabetes, Osteoarthritis, Osteomyelitis, Neuropathy, Received Radiation, Confinement Anxiety 10/02/2020 N/[name] N/[name] Date Acquired: 35 N/[name] N/[name] Weeks of Treatment: Open N/[name] N/[name] Wound Status: No  N/[name] N/[name] Wound Recurrence: 0.6x0.4x0.5 N/[name] N/[name] Measurements L x W x D (cm) 0.188 N/[name] N/[name] [name] (cm) : rea 0.094 N/[name] N/[name] Volume (cm) : 53.90% N/[name] N/[name] % Reduction in [name] rea: 23.60% N/[name] N/[name] % Reduction in Volume: 6 Starting Position 1 (o'clock): 12 Ending Position 1 (o'clock): Bedel, Kadience [name] ([id number]) [id number] Page 3 of 7 0.7 Maximum Distance 1 (cm): Yes N/[name] N/[name] Undermining: Grade 2 N/[name] N/[name] Classification: Medium N/[name] N/[name] Exudate [name] mount: Serosanguineous N/[name] N/[name] Exudate Type: red, brown N/[name] N/[name] Exudate Color: Distinct, outline attached N/[name] N/[name] Wound Margin: Large (67-100%) N/[name] N/[name] Granulation [name] mount: Pink, Pale N/[name] N/[name] Granulation Quality: None Present (0%) N/[name] N/[name] Necrotic [name] mount: Fat Layer (Subcutaneous Tissue): Yes N/[name] N/[name] Exposed Structures: Fascia: No Tendon: No Muscle: No Joint: No Bone: No Large (67-100%) N/[name] N/[name] Epithelialization: Debridement - Excisional N/[name] N/[name] Debridement: Pre-procedure Verification/Time Out 15:30 N/[name] N/[name] Taken: Lidocaine 4% Topical Solution N/[name] N/[name] Pain Control: Callus, Subcutaneous, Slough N/[name] N/[name] Tissue Debrided: Skin/Subcutaneous Tissue N/[name] N/[name] Level: 1 N/[name]  N/[name] Debridement [name] (sq cm): rea Curette N/[name] N/[name] Instrument: Minimum N/[name] N/[name] Bleeding: Pressure N/[name] N/[name] Hemostasis [name] chieved: 0 N/[name] N/[name] Procedural Pain: 0 N/[name] N/[name] Post Procedural Pain: Procedure was tolerated well N/[name] N/[name] Debridement Treatment Response: 0.6x0.4x0.5 N/[name] N/[name] Post Debridement Measurements L x W x D (cm) 0.094 N/[name] N/[name] Post Debridement Volume: (cm) Callus: Yes N/[name] N/[name] Periwound Skin Texture: Excoriation: No Induration: No Crepitus: No Rash: No Scarring: No Dry/Scaly: Yes N/[name] N/[name] Periwound Skin Moisture: Maceration: No Atrophie Blanche: No N/[name] N/[name] Periwound Skin Color: Cyanosis: No Ecchymosis: No Erythema: No Hemosiderin Staining: No Mottled: No Pallor: No Rubor: No No Abnormality N/[name] N/[name] Temperature: Debridement N/[name] N/[name] Procedures Performed: Treatment Notes Wound #4 (Metatarsal head first) Wound Laterality: Plantar, Left Cleanser Soap and Water Discharge Instruction: May shower and wash wound with dial antibacterial soap and water prior to dressing change. Wound Cleanser Discharge Instruction: Cleanse the wound with wound cleanser prior to applying [name] clean dressing using gauze sponges, not tissue or cotton balls. Peri-Wound Care Skin Prep Discharge Instruction: Use skin prep as directed Topical BlastX Primary Dressing Hydrofera Blue Ready Transfer Foam, 2.5x2.5 (in/in) Discharge Instruction: Apply directly to wound bed as directed Secondary Dressing ALLEVYN Gentle Border, 3x3 (in/in) Discharge Instruction: Apply over primary dressing as directed. Optifoam Non-Adhesive Dressing, 4x4 in Discharge Instruction: Apply foam donut over primary dressing as directed. Secured With Compression Lamonte RicherWrap Sperry, Allaina [name] ([id number]) [id number] Page 4 of 7 Compression Stockings Add-Ons Electronic Signature(s) Signed: 06/27/2022 4:03:08 PM By: Geralyn CorwinHoffman, Jessica DO Entered By: Geralyn CorwinHoffman, Jessica on 06/27/2022  15:54:10 -------------------------------------------------------------------------------- Multi-Disciplinary Care Plan Details Patient Name: Date of Service: Dionne BucyGRO SS, [name] 06/27/2022 3:00 PM Medical Record Number: [id number] Patient Account Number: [id number] Date of Birth/Sex: Treating RN: [birth year] (72 y.o. Debara PickettF) Deaton, Yvonne KendallBobbi Primary Care Raizy Auzenne: Laurann MontanaWhite, Cynthia Other Clinician: Referring Sheniya Garciaperez: Treating Miciah Shealy/Extender: Dorann OuHoffman, Jessica White, Cynthia Weeks in Treatment: 4235 Active Inactive Nutrition Nursing Diagnoses: Impaired glucose control: actual or potential Goals: Patient/caregiver verbalizes understanding of need to maintain therapeutic glucose control per primary care physician Date Initiated: 10/21/2021 Target Resolution Date: 07/22/2022 Goal Status: Active Interventions: Assess HgA1c results as ordered upon admission and as needed Provide education on elevated blood sugars and impact on wound healing Notes: Wound/Skin Impairment Nursing Diagnoses: Impaired tissue integrity Goals: Patient/caregiver will verbalize understanding of skin care regimen Date Initiated: 10/21/2021 Target Resolution Date: 07/22/2022 Goal Status: Active Ulcer/skin breakdown will have [name] volume reduction of 30%  by week 4 Date Initiated: 10/21/2021 Date Inactivated: 11/29/2021 Target Resolution Date: 11/18/2021 Goal Status: Met Interventions: Assess patient/caregiver ability to obtain necessary supplies Assess patient/caregiver ability to perform ulcer/skin care regimen upon admission and as needed Assess ulceration(s) every visit Provide education on ulcer and skin care Treatment Activities: Topical wound management initiated : 10/21/2021 Notes: 11/29/21: Wound care regimen continues. Running IVR for skin sub Electronic Signature(s) Signed: 06/27/2022 5:03:18 PM By: Shawn Stall RN, BSN Entered By: Shawn Stall on 06/27/2022 15:37:25 Pain Assessment  Details -------------------------------------------------------------------------------- [name] ([id number]) [id number] Page 5 of 7 Patient Name: Date of Service: [name]. 06/27/2022 3:00 PM Medical Record Number: [id number] Patient Account Number: [id number] Date of Birth/Sex: Treating RN: Dec 31, 1950 (72 y.o. Arta Silence Primary Care Mera Gunkel: Laurann Montana Other Clinician: Referring Sonny Poth: Treating Payden Bonus/Extender: Willene Hatchet Weeks in Treatment: 75 Active Problems Location of Pain Severity and Description of Pain Patient Has Paino No Site Locations Pain Management and Medication Current Pain Management: Electronic Signature(s) Signed: 06/27/2022 5:03:18 PM By: Shawn Stall RN, BSN Entered By: Shawn Stall on 06/27/2022 15:11:14 -------------------------------------------------------------------------------- Patient/Caregiver Education Details Patient Name: Date of Service: [name]. 4/8/2024andnbsp3:00 PM Medical Record Number: [id number] Patient Account Number: [id number] Date of Birth/Gender: Treating RN: [birth year] (72 y.o. Arta Silence Primary Care Physician: Laurann Montana Other Clinician: Referring Physician: Treating Physician/Extender: Dorann Ou in Treatment: 80 Education Assessment Education Provided To: Patient Education Topics Provided Wound/Skin Impairment: Handouts: Caring for Your Ulcer Methods: Explain/Verbal Responses: Reinforcements needed Electronic Signature(s) Signed: 06/27/2022 5:03:18 PM By: Shawn Stall RN, BSN Entered By: Shawn Stall on 06/27/2022 15:37:36 -------------------------------------------------------------------------------- Wound Assessment Details Patient Name: Date of Service: Dionne Bucy, [name] 06/27/2022 3:00 PM Holshouser, Gavin Pound [name] ([id number]) [id number] Page 6 of 7 Medical Record  Number: [id number] Patient Account Number: [id number] Date of Birth/Sex: Treating RN: 1950/09/01 (72 y.o. Debara Pickett, Millard.Loa Primary Care Byran Bilotti: Laurann Montana Other Clinician: Referring Loria Lacina: Treating Neira Bentsen/Extender: Willene Hatchet Weeks in Treatment: 35 Wound Status Wound Number: 4 Primary Diabetic Wound/Ulcer of the Lower Extremity Etiology: Wound Location: Left, Plantar Metatarsal head first Wound Open Wounding Event: Gradually Appeared Status: Date Acquired: 10/02/2020 Comorbid Cataracts, Sleep Apnea, Hypertension, Type II Diabetes, Weeks Of Treatment: 35 History: Osteoarthritis, Osteomyelitis, Neuropathy, Received Radiation, Clustered Wound: No Confinement Anxiety Photos Wound Measurements Length: (cm) 0.6 Width: (cm) 0.4 Depth: (cm) 0.5 Area: (cm) 0.188 Volume: (cm) 0.094 % Reduction in Area: 53.9% % Reduction in Volume: 23.6% Epithelialization: Large (67-100%) Tunneling: No Undermining: Yes Starting Position (o'clock): 6 Ending Position (o'clock): 12 Maximum Distance: (cm) 0.7 Wound Description Classification: Grade 2 Wound Margin: Distinct, outline attached Exudate Amount: Medium Exudate Type: Serosanguineous Exudate Color: red, brown Foul Odor After Cleansing: No Slough/Fibrino No Wound Bed Granulation Amount: Large (67-100%) Exposed Structure Granulation Quality: Pink, Pale Fascia Exposed: No Necrotic Amount: None Present (0%) Fat Layer (Subcutaneous Tissue) Exposed: Yes Tendon Exposed: No Muscle Exposed: No Joint Exposed: No Bone Exposed: No Periwound Skin Texture Texture Color No Abnormalities Noted: No No Abnormalities Noted: No Callus: Yes Atrophie Blanche: No Crepitus: No Cyanosis: No Excoriation: No Ecchymosis: No Induration: No Erythema: No Rash: No Hemosiderin Staining: No Scarring: No Mottled: No Pallor: No Moisture Rubor: No No Abnormalities Noted: No Dry / Scaly: Yes Temperature / Pain Maceration:  No Temperature: No Abnormality Treatment Notes Wound #4 (Metatarsal head first) Wound Laterality: Plantar, Left Marone, Lissete [name] ([id number]) [id number] Page 7 of 7 Cleanser Soap  and Water Discharge Instruction: May shower and wash wound with dial antibacterial soap and water prior to dressing change. Wound Cleanser Discharge Instruction: Cleanse the wound with wound cleanser prior to applying [name] clean dressing using gauze sponges, not tissue or cotton balls. Peri-Wound Care Skin Prep Discharge Instruction: Use skin prep as directed Topical BlastX Primary Dressing Hydrofera Blue Ready Transfer Foam, 2.5x2.5 (in/in) Discharge Instruction: Apply directly to wound bed as directed Secondary Dressing ALLEVYN Gentle Border, 3x3 (in/in) Discharge Instruction: Apply over primary dressing as directed. Optifoam Non-Adhesive Dressing, 4x4 in Discharge Instruction: Apply foam donut over primary dressing as directed. Secured With Compression Wrap Compression Stockings Facilities manager) Signed: 06/27/2022 5:03:18 PM By: Shawn Stall RN, BSN Entered By: Shawn Stall on 06/27/2022 15:16:29 -------------------------------------------------------------------------------- Vitals Details Patient Name: Date of Service: Dionne Bucy, [name] 06/27/2022 3:00 PM Medical Record Number: [id number] Patient Account Number: [id number] Date of Birth/Sex: Treating RN: 1950/10/03 (72 y.o. Arta Silence Primary Care Alahni Varone: Laurann Montana Other Clinician: Referring Kisa Fujii: Treating Lavida Patch/Extender: Willene Hatchet Weeks in Treatment: 35 Vital Signs Time Taken: 15:10 Temperature (F): 98 Pulse (bpm): 80 Respiratory Rate (breaths/min): 20 Blood Pressure (mmHg): 159/83 Reference Range: 80 - 120 mg / dl Electronic Signature(s) Signed: 06/27/2022 5:03:18 PM By: Shawn Stall RN, BSN Entered By: Shawn Stall on 06/27/2022 15:11:10

## 2022-07-07 DIAGNOSIS — H35033 Hypertensive retinopathy, bilateral: Secondary | ICD-10-CM | POA: Diagnosis not present

## 2022-07-07 DIAGNOSIS — H34831 Tributary (branch) retinal vein occlusion, right eye, with macular edema: Secondary | ICD-10-CM | POA: Diagnosis not present

## 2022-07-07 DIAGNOSIS — H43813 Vitreous degeneration, bilateral: Secondary | ICD-10-CM | POA: Diagnosis not present

## 2022-07-11 ENCOUNTER — Encounter (HOSPITAL_BASED_OUTPATIENT_CLINIC_OR_DEPARTMENT_OTHER): Payer: PPO | Admitting: Internal Medicine

## 2022-07-18 ENCOUNTER — Inpatient Hospital Stay: Payer: PPO | Attending: Hematology and Oncology | Admitting: Hematology and Oncology

## 2022-07-18 ENCOUNTER — Ambulatory Visit (INDEPENDENT_AMBULATORY_CARE_PROVIDER_SITE_OTHER): Payer: PPO

## 2022-07-18 ENCOUNTER — Ambulatory Visit: Payer: PPO | Admitting: Podiatry

## 2022-07-18 ENCOUNTER — Other Ambulatory Visit: Payer: Self-pay | Admitting: Podiatry

## 2022-07-18 DIAGNOSIS — M79675 Pain in left toe(s): Secondary | ICD-10-CM

## 2022-07-18 DIAGNOSIS — F3341 Major depressive disorder, recurrent, in partial remission: Secondary | ICD-10-CM | POA: Diagnosis not present

## 2022-07-18 DIAGNOSIS — M79674 Pain in right toe(s): Secondary | ICD-10-CM

## 2022-07-18 DIAGNOSIS — L97522 Non-pressure chronic ulcer of other part of left foot with fat layer exposed: Secondary | ICD-10-CM | POA: Diagnosis not present

## 2022-07-18 DIAGNOSIS — M21619 Bunion of unspecified foot: Secondary | ICD-10-CM | POA: Diagnosis not present

## 2022-07-18 DIAGNOSIS — B351 Tinea unguium: Secondary | ICD-10-CM | POA: Diagnosis not present

## 2022-07-18 DIAGNOSIS — N183 Chronic kidney disease, stage 3 unspecified: Secondary | ICD-10-CM | POA: Diagnosis not present

## 2022-07-18 DIAGNOSIS — I129 Hypertensive chronic kidney disease with stage 1 through stage 4 chronic kidney disease, or unspecified chronic kidney disease: Secondary | ICD-10-CM | POA: Diagnosis not present

## 2022-07-18 DIAGNOSIS — E785 Hyperlipidemia, unspecified: Secondary | ICD-10-CM | POA: Diagnosis not present

## 2022-07-18 DIAGNOSIS — E1121 Type 2 diabetes mellitus with diabetic nephropathy: Secondary | ICD-10-CM | POA: Diagnosis not present

## 2022-07-18 MED ORDER — JUVEN PO PACK
1.0000 | PACK | Freq: Two times a day (BID) | ORAL | 2 refills | Status: DC
Start: 2022-07-18 — End: 2022-08-25

## 2022-07-18 NOTE — Assessment & Plan Note (Deleted)
01/02/2020: Screening mammogram showed two groups of calcifications in the inferior medial quadrant of the left breast, 0.5cm and 0.9cm. Biopsy showed DCIS, grade 3, ER/PR negative.  02/25/20: Left lumpectomy Magnus Ivan): high grade DCIS, 1.4cm, clear margins 04/02/19-04/29/20: XRT   Tamoxifen toxicities: Denies any adverse effects with tamoxifen   Apathy: Patient has been alone since COVID and does not feel like she wants to mingle with people.  Both her sisters are assisting her into a simulated more with society again.   Patient had prior hysterectomy   Breast cancer surveillance: 1.  Breast exam 07/18/2022: Benign 2. mammogram: At [facility] 01/26/22: Benign breast density category B   Bone density 06/08/2022: T-score -0.5: Normal Return to clinic in 1 year for follow-up

## 2022-07-18 NOTE — Patient Instructions (Signed)
Pre-Operative Instructions  Congratulations, you have decided to take an important step to improving your quality of life.  You can be assured that the doctors of [facility] will be with you every step of the way.  Plan to be at the surgery center/hospital at least 1 (one) hour prior to your scheduled time unless otherwise directed by the surgical center/hospital staff.  You must have a responsible adult accompany you, remain during the surgery and drive you home.  Make sure you have directions to the surgical center/hospital and know how to get there on time. For hospital based surgery you will need to obtain a history and physical form from your family physician within 1 month prior to the date of surgery- we will give you a form for you primary physician.  We make every effort to accommodate the date you request for surgery.  There are however, times where surgery dates or times have to be moved.  We will contact you as soon as possible if a change in schedule is required.   No Aspirin/Ibuprofen for one week before surgery.  If you are on aspirin, any non-steroidal anti-inflammatory medications (Mobic, Aleve, Ibuprofen) you should stop taking it 7 days prior to your surgery.  You make take Tylenol  For pain prior to surgery.  Medications- If you are taking daily heart and blood pressure medications, seizure, reflux, allergy, asthma, anxiety, pain or diabetes medications, make sure the surgery center/hospital is aware before the day of surgery so they may notify you which medications to take or avoid the day of surgery. No food or drink after midnight the night before surgery unless directed otherwise by surgical center/hospital staff. No alcoholic beverages 24 hours prior to surgery.  No smoking 24 hours prior to or 24 hours after surgery. Wear loose pants or shorts- loose enough to fit over bandages, boots, and casts. No slip on shoes, sneakers are best. Bring your boot with you to the  surgery center/hospital.  Also bring crutches or a walker if your physician has prescribed it for you.  If you do not have this equipment, it will be provided for you after surgery. If you have not been contracted by the surgery center/hospital by the day before your surgery, call to confirm the date and time of your surgery. Leave-time from work may vary depending on the type of surgery you have.  Appropriate arrangements should be made prior to surgery with your employer. Prescriptions will be provided immediately following surgery by your doctor.  Have these filled as soon as possible after surgery and take the medication as directed. Remove nail polish on the operative foot. Wash the night before surgery.  The night before surgery wash the foot and leg well with the antibacterial soap provided and water paying special attention to beneath the toenails and in between the toes.  Rinse thoroughly with water and dry well with a towel.  Perform this wash unless told not to do so by your physician.  Enclosed: 1 Ice pack (please put in freezer the night before surgery)   1 Hibiclens skin cleaner   Pre-op Instructions  If you have any questions regarding the instructions, do not hesitate to call our office at any point during this process.   [place]: [address] [place], [place] [address] [phone]  [place]: [address] [place],  [address] [phone]  Dr. Kawika Bischoff, DPM  

## 2022-07-19 ENCOUNTER — Encounter (HOSPITAL_BASED_OUTPATIENT_CLINIC_OR_DEPARTMENT_OTHER): Payer: PPO | Admitting: Internal Medicine

## 2022-07-19 DIAGNOSIS — F332 Major depressive disorder, recurrent severe without psychotic features: Secondary | ICD-10-CM | POA: Diagnosis not present

## 2022-07-19 NOTE — Progress Notes (Signed)
Subjective:  Chief Complaint  Patient presents with   Foot Ulcer    L foot ulcer, discuss surgery, wound care follow up    72 year old female presents the office today for follow-up evaluation of a wound on her left foot.  She has been going to wound care center but the wound has remained about the same she presents today for possible surgical intervention to help facilitate wound healing.  She currently denies any fevers or chills.  After discussion with the she states that she is lost quite a bit of weight and she is only eating 1 meal a day.   Objective: AAO x3, NAD Ulceration of the plantar aspect left foot submetatarsal 1 with hyperkeratotic periwound.  Ulceration today measures about 1 x 0.6 x 0.2 cm with granular tissue.  There is no probing to bone or tunneling.  There is no surrounding erythema, ascending cellulitis.  There is no fluctuance or crepitation there is no malodor.  Significant bunions present. Nails are also hypertrophic, dystrophic, elongated nails rubbing on the adjacent toes causing irritation.  No further skin breakdown. Nails are hypertrophic, dystrophic, elongated toenails 1, 3, 4, 5 on the left and 3 through 5 on the right. No pain with calf compression, swelling, warmth, erythema  Assessment: 71 year old female left foot recurrent ulceration, bunion deformity; symptomatic onychomycosis  Plan: -All treatment options discussed with the patient including all alternatives, risks, complications.  -X-rays were obtained reviewed of the left foot.  Severe bunions present with evidence of prior surgery.  There is no definitive cortical changes suggest osteomyelitis at this time.  No soft tissue edema. -In regards to ongoing wound patient states that she like to do something to help facilitate wound healing.  Ultimately discussed the procedure arthrodesis given the ulceration I would not want to proceed with hardware at this time.  I discussed with her essentially a silver  bunionectomy to shave down the medial aspect of the first metatarsal head to help decrease pressure to help facilitate wound healing.  Discussed with her about a guarantee that she is still at risk of amputation and transfer lesions.  She understands this and she wants to proceed with surgery. -The incision placement as well as the postoperative course was discussed with the patient. I discussed risks of the surgery which include, but not limited to, infection, bleeding, pain, swelling, need for further surgery, delayed or nonhealing, painful or ugly scar, numbness or sensation changes, over/under correction, recurrence, transfer lesions, further deformity, DVT/PE, loss of toe/foot. Patient understands these risks and wishes to proceed with surgery. The surgical consent was reviewed with the patient all 3 pages were signed. No promises or guarantees were given to the outcome of the procedure. All questions were answered to the best of my ability. Before the surgery the patient was encouraged to call the office if there is any further questions. The surgery will be performed at the [facility] on an outpatient basis. -Updated ABI prior to surgery -Blood work ordered-CBC, CMP, sed rate, CRP -He is also not been eating much. Prescribed Juven supplements to help. -Sharply debrided nails x 7 without any complications or bleeding.  *Patient was not feeling well in the office today.  No chest pain or shortness of breath.  Vital signs stable.  Glucose somewhat elevated.  Patient was given a package of crackers and she felt better after she ate.  If symptoms persist recommend follow-up with primary care doctor, go to urgent care.  35 minutes spent with the patient.  Vivi Barrack DPM

## 2022-07-20 ENCOUNTER — Encounter: Payer: Self-pay | Admitting: Hematology and Oncology

## 2022-07-20 ENCOUNTER — Other Ambulatory Visit: Payer: Self-pay | Admitting: Hematology and Oncology

## 2022-07-21 ENCOUNTER — Telehealth: Payer: Self-pay | Admitting: Hematology and Oncology

## 2022-07-21 ENCOUNTER — Telehealth: Payer: Self-pay | Admitting: Urology

## 2022-07-21 NOTE — Telephone Encounter (Signed)
DOS - 07/27/22  SILVER BUNIONECTOMY LEFT --- [address]  [facility]   RECEIVED A FAX FROM [facility] STATING THAT CPT CODE 60454 HAS BEEN APPROVED, AUTH # [id number], GOOD FROM 07/27/22 - 10/25/22.

## 2022-07-21 NOTE — Telephone Encounter (Signed)
Scheduled appointment per scheduling message. Patient is aware of the made appointment. 

## 2022-07-25 ENCOUNTER — Telehealth (INDEPENDENT_AMBULATORY_CARE_PROVIDER_SITE_OTHER): Payer: PPO | Admitting: Podiatry

## 2022-07-25 NOTE — Telephone Encounter (Signed)
Called patient to remind her to have preop blood work completed today. Requisition in system for [employer]. Gave patient address of [facility] on [address], [place], [place]. She related understanding.

## 2022-07-25 NOTE — Telephone Encounter (Signed)
Patient called regarding test needed before she is to have surgery on Wednesday with Dr. Ardelle Anton. Dr. Ardelle Anton has returned her call and she is to have ABI testing done tomorrow at [facility] on [facility]. Patient is aware and related understanding.

## 2022-07-26 ENCOUNTER — Ambulatory Visit (HOSPITAL_COMMUNITY)
Admission: RE | Admit: 2022-07-26 | Discharge: 2022-07-26 | Disposition: A | Discharge status: Home / Self Care | Payer: PPO | Source: Ambulatory Visit | Attending: Podiatry | Admitting: Podiatry

## 2022-07-26 ENCOUNTER — Other Ambulatory Visit (HOSPITAL_COMMUNITY): Payer: Self-pay | Admitting: Podiatry

## 2022-07-26 DIAGNOSIS — I739 Peripheral vascular disease, unspecified: Secondary | ICD-10-CM | POA: Insufficient documentation

## 2022-07-26 DIAGNOSIS — L97522 Non-pressure chronic ulcer of other part of left foot with fat layer exposed: Secondary | ICD-10-CM

## 2022-07-26 LAB — VAS US ABI WITH/WO TBI: Right ABI: 1.04

## 2022-07-27 LAB — CBC WITH DIFFERENTIAL/PLATELET
Absolute Monocytes: 581 cells/uL (ref 200–950)
Basophils Absolute: 50 cells/uL (ref 0–200)
Basophils Relative: 0.6 %
Eosinophils Absolute: 58 cells/uL (ref 15–500)
Eosinophils Relative: 0.7 %
HCT: 36.4 % (ref 35.0–45.0)
Hemoglobin: 12.5 g/dL (ref 11.7–15.5)
Lymphs Abs: 2565 cells/uL (ref 850–3900)
MCH: 32.4 pg (ref 27.0–33.0)
MCHC: 34.3 g/dL (ref 32.0–36.0)
MCV: 94.3 fL (ref 80.0–100.0)
MPV: 9.4 fL (ref 7.5–12.5)
Monocytes Relative: 7 %
Neutro Abs: 5046 cells/uL (ref 1500–7800)
Neutrophils Relative %: 60.8 %
Platelets: 327 10*3/uL (ref 140–400)
RBC: 3.86 10*6/uL (ref 3.80–5.10)
RDW: 13 % (ref 11.0–15.0)
Total Lymphocyte: 30.9 %
WBC: 8.3 10*3/uL (ref 3.8–10.8)

## 2022-07-27 LAB — COMPLETE METABOLIC PANEL WITH GFR
AG Ratio: 1.1 (calc) (ref 1.0–2.5)
ALT: 11 U/L (ref 6–29)
AST: 13 U/L (ref 10–35)
Albumin: 3.9 g/dL (ref 3.6–5.1)
Alkaline phosphatase (APISO): 51 U/L (ref 37–153)
BUN/Creatinine Ratio: 14 (calc) (ref 6–22)
BUN: 20 mg/dL (ref 7–25)
CO2: 21 mmol/L (ref 20–32)
Calcium: 9.2 mg/dL (ref 8.6–10.4)
Chloride: 100 mmol/L (ref 98–110)
Creat: 1.42 mg/dL — ABNORMAL HIGH (ref 0.60–1.00)
Globulin: 3.4 g/dL (calc) (ref 1.9–3.7)
Glucose, Bld: 214 mg/dL — ABNORMAL HIGH (ref 65–99)
Potassium: 3.7 mmol/L (ref 3.5–5.3)
Sodium: 134 mmol/L — ABNORMAL LOW (ref 135–146)
Total Bilirubin: 0.7 mg/dL (ref 0.2–1.2)
Total Protein: 7.3 g/dL (ref 6.1–8.1)
eGFR: 40 mL/min/{1.73_m2} — ABNORMAL LOW (ref >=60)

## 2022-07-27 LAB — VAS US ABI WITH/WO TBI: Left ABI: 1.04

## 2022-07-27 LAB — C-REACTIVE PROTEIN: CRP: 4.1 mg/L (ref <8.0)

## 2022-07-27 LAB — SEDIMENTATION RATE: Sed Rate: 65 mm/h — ABNORMAL HIGH (ref 0–30)

## 2022-08-01 ENCOUNTER — Ambulatory Visit: Payer: PPO | Admitting: Podiatry

## 2022-08-01 ENCOUNTER — Encounter: Payer: Self-pay | Admitting: Podiatry

## 2022-08-01 VITALS — BP 144/70 | HR 62

## 2022-08-01 DIAGNOSIS — L97522 Non-pressure chronic ulcer of other part of left foot with fat layer exposed: Secondary | ICD-10-CM

## 2022-08-01 NOTE — Progress Notes (Signed)
Subjective:  Chief Complaint  Patient presents with   Foot Ulcer    Follow up ulcer hallux left   "Its okay, I guess. My blood sugar is down"     72 year old female presents the office today for follow-up evaluation of a wound on her left foot.  Surgery was canceled last week given significantly increased blood pressure and she was not taking her diabetes medication.  She states that her sugar is getting better but her blood pressure is still high.    Objective: AAO x3, NAD Prior to debridement ulceration left foot measured 1 x 0.5 x 0.2 cm.  Hyperkeratotic periwound present.  After debridement of the wound it was larger measuring 1.5 x 0.6 x 0.3 cm with granular wound base.  There is no skin erythema, ascending cellulitis.  There is no fluctuance or evidence of ecchymosis. No pain with calf compression, swelling, warmth, erythema    Assessment: 72 year old female left foot recurrent ulceration, bunion deformity; symptomatic onychomycosis  Plan: -All treatment options discussed with the patient including all alternatives, risks, complications.  -Medically necessary wound debridement was performed today.  I suspect the patient will utilize #312 with scalpel to remove nonviable devitalized tissue in order to promote wound healing.  I debrided the ulcer down to healthy, bleeding, granular tissue.  There is minimal blood loss.  Hemostasis achieved with manual compression.  Antibiotic ointment was applied today followed by dressing.  Recommend continue with daily dressing changes at home.  Continue offloading at all times. -Once she is medically cleared we will reconsider surgery. -ABIs showed that she should have adequate healing  Return in about 3 weeks (around 08/22/2022) for ulcer.  Vivi Barrack DPM

## 2022-08-03 DIAGNOSIS — I700 Atherosclerosis of aorta: Secondary | ICD-10-CM | POA: Diagnosis not present

## 2022-08-03 DIAGNOSIS — E785 Hyperlipidemia, unspecified: Secondary | ICD-10-CM | POA: Diagnosis not present

## 2022-08-03 DIAGNOSIS — W19XXXA Unspecified fall, initial encounter: Secondary | ICD-10-CM | POA: Diagnosis not present

## 2022-08-03 DIAGNOSIS — F3341 Major depressive disorder, recurrent, in partial remission: Secondary | ICD-10-CM | POA: Diagnosis not present

## 2022-08-03 DIAGNOSIS — E1122 Type 2 diabetes mellitus with diabetic chronic kidney disease: Secondary | ICD-10-CM | POA: Diagnosis not present

## 2022-08-03 DIAGNOSIS — N183 Chronic kidney disease, stage 3 unspecified: Secondary | ICD-10-CM | POA: Diagnosis not present

## 2022-08-03 DIAGNOSIS — E1121 Type 2 diabetes mellitus with diabetic nephropathy: Secondary | ICD-10-CM | POA: Diagnosis not present

## 2022-08-03 DIAGNOSIS — C50919 Malignant neoplasm of unspecified site of unspecified female breast: Secondary | ICD-10-CM | POA: Diagnosis not present

## 2022-08-03 DIAGNOSIS — L97509 Non-pressure chronic ulcer of other part of unspecified foot with unspecified severity: Secondary | ICD-10-CM | POA: Diagnosis not present

## 2022-08-03 DIAGNOSIS — Z794 Long term (current) use of insulin: Secondary | ICD-10-CM | POA: Diagnosis not present

## 2022-08-03 DIAGNOSIS — E11621 Type 2 diabetes mellitus with foot ulcer: Secondary | ICD-10-CM | POA: Diagnosis not present

## 2022-08-03 DIAGNOSIS — S20212A Contusion of left front wall of thorax, initial encounter: Secondary | ICD-10-CM | POA: Diagnosis not present

## 2022-08-08 ENCOUNTER — Emergency Department (HOSPITAL_COMMUNITY): Payer: PPO

## 2022-08-08 ENCOUNTER — Inpatient Hospital Stay (HOSPITAL_COMMUNITY)
Admission: EM | Admit: 2022-08-08 | Discharge: 2022-08-12 | DRG: 065 | Disposition: A | Discharge status: Rehabilitation Facility | Payer: PPO | Attending: Family Medicine | Admitting: Family Medicine

## 2022-08-08 ENCOUNTER — Inpatient Hospital Stay (HOSPITAL_COMMUNITY): Payer: PPO

## 2022-08-08 DIAGNOSIS — L89311 Pressure ulcer of right buttock, stage 1: Secondary | ICD-10-CM | POA: Diagnosis not present

## 2022-08-08 DIAGNOSIS — D631 Anemia in chronic kidney disease: Secondary | ICD-10-CM | POA: Diagnosis not present

## 2022-08-08 DIAGNOSIS — N183 Chronic kidney disease, stage 3 unspecified: Secondary | ICD-10-CM | POA: Diagnosis not present

## 2022-08-08 DIAGNOSIS — E11319 Type 2 diabetes mellitus with unspecified diabetic retinopathy without macular edema: Secondary | ICD-10-CM | POA: Diagnosis present

## 2022-08-08 DIAGNOSIS — R600 Localized edema: Secondary | ICD-10-CM

## 2022-08-08 DIAGNOSIS — N1831 Chronic kidney disease, stage 3a: Secondary | ICD-10-CM | POA: Insufficient documentation

## 2022-08-08 DIAGNOSIS — I7389 Other specified peripheral vascular diseases: Secondary | ICD-10-CM | POA: Diagnosis not present

## 2022-08-08 DIAGNOSIS — W07XXXA Fall from chair, initial encounter: Secondary | ICD-10-CM | POA: Diagnosis present

## 2022-08-08 DIAGNOSIS — F329 Major depressive disorder, single episode, unspecified: Secondary | ICD-10-CM | POA: Diagnosis not present

## 2022-08-08 DIAGNOSIS — E1165 Type 2 diabetes mellitus with hyperglycemia: Secondary | ICD-10-CM | POA: Diagnosis not present

## 2022-08-08 DIAGNOSIS — E669 Obesity, unspecified: Secondary | ICD-10-CM | POA: Diagnosis not present

## 2022-08-08 DIAGNOSIS — B9620 Unspecified Escherichia coli [E. coli] as the cause of diseases classified elsewhere: Secondary | ICD-10-CM | POA: Diagnosis present

## 2022-08-08 DIAGNOSIS — E119 Type 2 diabetes mellitus without complications: Secondary | ICD-10-CM

## 2022-08-08 DIAGNOSIS — E861 Hypovolemia: Secondary | ICD-10-CM | POA: Diagnosis not present

## 2022-08-08 DIAGNOSIS — W19XXXA Unspecified fall, initial encounter: Secondary | ICD-10-CM | POA: Diagnosis not present

## 2022-08-08 DIAGNOSIS — Z8673 Personal history of transient ischemic attack (TIA), and cerebral infarction without residual deficits: Secondary | ICD-10-CM

## 2022-08-08 DIAGNOSIS — E1122 Type 2 diabetes mellitus with diabetic chronic kidney disease: Secondary | ICD-10-CM | POA: Diagnosis present

## 2022-08-08 DIAGNOSIS — Z7982 Long term (current) use of aspirin: Secondary | ICD-10-CM

## 2022-08-08 DIAGNOSIS — I6381 Other cerebral infarction due to occlusion or stenosis of small artery: Principal | ICD-10-CM | POA: Diagnosis present

## 2022-08-08 DIAGNOSIS — R7401 Elevation of levels of liver transaminase levels: Secondary | ICD-10-CM | POA: Diagnosis present

## 2022-08-08 DIAGNOSIS — I63511 Cerebral infarction due to unspecified occlusion or stenosis of right middle cerebral artery: Secondary | ICD-10-CM | POA: Diagnosis not present

## 2022-08-08 DIAGNOSIS — N3000 Acute cystitis without hematuria: Secondary | ICD-10-CM

## 2022-08-08 DIAGNOSIS — Z79899 Other long term (current) drug therapy: Secondary | ICD-10-CM

## 2022-08-08 DIAGNOSIS — S2231XA Fracture of one rib, right side, initial encounter for closed fracture: Secondary | ICD-10-CM | POA: Diagnosis not present

## 2022-08-08 DIAGNOSIS — E782 Mixed hyperlipidemia: Secondary | ICD-10-CM | POA: Diagnosis not present

## 2022-08-08 DIAGNOSIS — I6522 Occlusion and stenosis of left carotid artery: Secondary | ICD-10-CM | POA: Diagnosis not present

## 2022-08-08 DIAGNOSIS — R29700 NIHSS score 0: Secondary | ICD-10-CM | POA: Diagnosis present

## 2022-08-08 DIAGNOSIS — D649 Anemia, unspecified: Secondary | ICD-10-CM | POA: Diagnosis present

## 2022-08-08 DIAGNOSIS — I129 Hypertensive chronic kidney disease with stage 1 through stage 4 chronic kidney disease, or unspecified chronic kidney disease: Secondary | ICD-10-CM | POA: Diagnosis not present

## 2022-08-08 DIAGNOSIS — I639 Cerebral infarction, unspecified: Secondary | ICD-10-CM | POA: Diagnosis not present

## 2022-08-08 DIAGNOSIS — S2241XA Multiple fractures of ribs, right side, initial encounter for closed fracture: Secondary | ICD-10-CM | POA: Diagnosis not present

## 2022-08-08 DIAGNOSIS — E871 Hypo-osmolality and hyponatremia: Secondary | ICD-10-CM | POA: Diagnosis present

## 2022-08-08 DIAGNOSIS — N179 Acute kidney failure, unspecified: Secondary | ICD-10-CM | POA: Insufficient documentation

## 2022-08-08 DIAGNOSIS — L89321 Pressure ulcer of left buttock, stage 1: Secondary | ICD-10-CM | POA: Diagnosis not present

## 2022-08-08 DIAGNOSIS — F32A Depression, unspecified: Secondary | ICD-10-CM | POA: Diagnosis present

## 2022-08-08 DIAGNOSIS — S91302A Unspecified open wound, left foot, initial encounter: Secondary | ICD-10-CM | POA: Diagnosis not present

## 2022-08-08 DIAGNOSIS — E872 Acidosis, unspecified: Secondary | ICD-10-CM | POA: Diagnosis present

## 2022-08-08 DIAGNOSIS — Z8542 Personal history of malignant neoplasm of other parts of uterus: Secondary | ICD-10-CM

## 2022-08-08 DIAGNOSIS — S0990XA Unspecified injury of head, initial encounter: Secondary | ICD-10-CM | POA: Diagnosis not present

## 2022-08-08 DIAGNOSIS — L89890 Pressure ulcer of other site, unstageable: Secondary | ICD-10-CM | POA: Diagnosis not present

## 2022-08-08 DIAGNOSIS — I6782 Cerebral ischemia: Secondary | ICD-10-CM | POA: Diagnosis not present

## 2022-08-08 DIAGNOSIS — R748 Abnormal levels of other serum enzymes: Secondary | ICD-10-CM | POA: Diagnosis present

## 2022-08-08 DIAGNOSIS — E1142 Type 2 diabetes mellitus with diabetic polyneuropathy: Secondary | ICD-10-CM | POA: Diagnosis not present

## 2022-08-08 DIAGNOSIS — R413 Other amnesia: Secondary | ICD-10-CM | POA: Diagnosis not present

## 2022-08-08 DIAGNOSIS — Z91148 Patient's other noncompliance with medication regimen for other reason: Secondary | ICD-10-CM

## 2022-08-08 DIAGNOSIS — L8990 Pressure ulcer of unspecified site, unspecified stage: Secondary | ICD-10-CM | POA: Insufficient documentation

## 2022-08-08 DIAGNOSIS — Z7984 Long term (current) use of oral hypoglycemic drugs: Secondary | ICD-10-CM

## 2022-08-08 DIAGNOSIS — R531 Weakness: Secondary | ICD-10-CM | POA: Diagnosis not present

## 2022-08-08 DIAGNOSIS — E1140 Type 2 diabetes mellitus with diabetic neuropathy, unspecified: Secondary | ICD-10-CM | POA: Diagnosis not present

## 2022-08-08 DIAGNOSIS — N390 Urinary tract infection, site not specified: Secondary | ICD-10-CM | POA: Insufficient documentation

## 2022-08-08 DIAGNOSIS — R0781 Pleurodynia: Secondary | ICD-10-CM | POA: Diagnosis not present

## 2022-08-08 DIAGNOSIS — R4182 Altered mental status, unspecified: Secondary | ICD-10-CM | POA: Diagnosis not present

## 2022-08-08 DIAGNOSIS — G4733 Obstructive sleep apnea (adult) (pediatric): Secondary | ICD-10-CM | POA: Diagnosis present

## 2022-08-08 DIAGNOSIS — Z880 Allergy status to penicillin: Secondary | ICD-10-CM

## 2022-08-08 DIAGNOSIS — R5381 Other malaise: Secondary | ICD-10-CM | POA: Diagnosis not present

## 2022-08-08 DIAGNOSIS — S91301A Unspecified open wound, right foot, initial encounter: Secondary | ICD-10-CM | POA: Diagnosis not present

## 2022-08-08 DIAGNOSIS — Z043 Encounter for examination and observation following other accident: Secondary | ICD-10-CM | POA: Diagnosis not present

## 2022-08-08 DIAGNOSIS — E860 Dehydration: Secondary | ICD-10-CM | POA: Diagnosis not present

## 2022-08-08 DIAGNOSIS — G4700 Insomnia, unspecified: Secondary | ICD-10-CM | POA: Diagnosis not present

## 2022-08-08 DIAGNOSIS — Z794 Long term (current) use of insulin: Secondary | ICD-10-CM | POA: Diagnosis not present

## 2022-08-08 DIAGNOSIS — Z8614 Personal history of Methicillin resistant Staphylococcus aureus infection: Secondary | ICD-10-CM

## 2022-08-08 DIAGNOSIS — Z7902 Long term (current) use of antithrombotics/antiplatelets: Secondary | ICD-10-CM

## 2022-08-08 DIAGNOSIS — I69998 Other sequelae following unspecified cerebrovascular disease: Secondary | ICD-10-CM | POA: Diagnosis not present

## 2022-08-08 DIAGNOSIS — Z8249 Family history of ischemic heart disease and other diseases of the circulatory system: Secondary | ICD-10-CM

## 2022-08-08 DIAGNOSIS — L97409 Non-pressure chronic ulcer of unspecified heel and midfoot with unspecified severity: Secondary | ICD-10-CM | POA: Diagnosis not present

## 2022-08-08 DIAGNOSIS — R296 Repeated falls: Secondary | ICD-10-CM

## 2022-08-08 DIAGNOSIS — N3941 Urge incontinence: Secondary | ICD-10-CM | POA: Diagnosis not present

## 2022-08-08 DIAGNOSIS — Z853 Personal history of malignant neoplasm of breast: Secondary | ICD-10-CM

## 2022-08-08 DIAGNOSIS — M1711 Unilateral primary osteoarthritis, right knee: Secondary | ICD-10-CM | POA: Diagnosis not present

## 2022-08-08 DIAGNOSIS — L89623 Pressure ulcer of left heel, stage 3: Secondary | ICD-10-CM | POA: Diagnosis not present

## 2022-08-08 DIAGNOSIS — Z882 Allergy status to sulfonamides status: Secondary | ICD-10-CM

## 2022-08-08 DIAGNOSIS — Z6834 Body mass index (BMI) 34.0-34.9, adult: Secondary | ICD-10-CM

## 2022-08-08 DIAGNOSIS — T796XXA Traumatic ischemia of muscle, initial encounter: Secondary | ICD-10-CM

## 2022-08-08 DIAGNOSIS — Z803 Family history of malignant neoplasm of breast: Secondary | ICD-10-CM

## 2022-08-08 DIAGNOSIS — I668 Occlusion and stenosis of other cerebral arteries: Secondary | ICD-10-CM | POA: Diagnosis not present

## 2022-08-08 DIAGNOSIS — T796XXD Traumatic ischemia of muscle, subsequent encounter: Secondary | ICD-10-CM | POA: Diagnosis not present

## 2022-08-08 DIAGNOSIS — I6389 Other cerebral infarction: Secondary | ICD-10-CM | POA: Diagnosis not present

## 2022-08-08 DIAGNOSIS — F331 Major depressive disorder, recurrent, moderate: Secondary | ICD-10-CM | POA: Diagnosis not present

## 2022-08-08 DIAGNOSIS — I10 Essential (primary) hypertension: Secondary | ICD-10-CM | POA: Diagnosis not present

## 2022-08-08 DIAGNOSIS — K5900 Constipation, unspecified: Secondary | ICD-10-CM | POA: Diagnosis not present

## 2022-08-08 DIAGNOSIS — R609 Edema, unspecified: Secondary | ICD-10-CM | POA: Diagnosis not present

## 2022-08-08 DIAGNOSIS — N393 Stress incontinence (female) (male): Secondary | ICD-10-CM | POA: Diagnosis not present

## 2022-08-08 DIAGNOSIS — Z881 Allergy status to other antibiotic agents status: Secondary | ICD-10-CM

## 2022-08-08 LAB — COMPREHENSIVE METABOLIC PANEL
ALT: 27 U/L (ref 0–44)
AST: 47 U/L — ABNORMAL HIGH (ref 15–41)
Albumin: 3.4 g/dL — ABNORMAL LOW (ref 3.5–5.0)
Alkaline Phosphatase: 46 U/L (ref 38–126)
Anion gap: 14 (ref 5–15)
BUN: 45 mg/dL — ABNORMAL HIGH (ref 8–23)
CO2: 21 mmol/L — ABNORMAL LOW (ref 22–32)
Calcium: 9.2 mg/dL (ref 8.9–10.3)
Chloride: 99 mmol/L (ref 98–111)
Creatinine, Ser: 2.29 mg/dL — ABNORMAL HIGH (ref 0.44–1.00)
GFR, Estimated: 22 mL/min — ABNORMAL LOW (ref >60)
Glucose, Bld: 172 mg/dL — ABNORMAL HIGH (ref 70–99)
Potassium: 3.8 mmol/L (ref 3.5–5.1)
Sodium: 134 mmol/L — ABNORMAL LOW (ref 135–145)
Total Bilirubin: 0.7 mg/dL (ref 0.3–1.2)
Total Protein: 8 g/dL (ref 6.5–8.1)

## 2022-08-08 LAB — CBC WITH DIFFERENTIAL/PLATELET
Abs Immature Granulocytes: 0.1 10*3/uL — ABNORMAL HIGH (ref 0.00–0.07)
Basophils Absolute: 0 10*3/uL (ref 0.0–0.1)
Basophils Relative: 0 %
Eosinophils Absolute: 0 10*3/uL (ref 0.0–0.5)
Eosinophils Relative: 0 %
HCT: 36.7 % (ref 36.0–46.0)
Hemoglobin: 12.1 g/dL (ref 12.0–15.0)
Immature Granulocytes: 1 %
Lymphocytes Relative: 14 %
Lymphs Abs: 2.1 10*3/uL (ref 0.7–4.0)
MCH: 33.1 pg (ref 26.0–34.0)
MCHC: 33 g/dL (ref 30.0–36.0)
MCV: 100.3 fL — ABNORMAL HIGH (ref 80.0–100.0)
Monocytes Absolute: 1 10*3/uL (ref 0.1–1.0)
Monocytes Relative: 7 %
Neutro Abs: 11.6 10*3/uL — ABNORMAL HIGH (ref 1.7–7.7)
Neutrophils Relative %: 78 %
Platelets: 323 10*3/uL (ref 150–400)
RBC: 3.66 MIL/uL — ABNORMAL LOW (ref 3.87–5.11)
RDW: 13.8 % (ref 11.5–15.5)
WBC: 14.8 10*3/uL — ABNORMAL HIGH (ref 4.0–10.5)
nRBC: 0 % (ref 0.0–0.2)

## 2022-08-08 LAB — URINALYSIS, ROUTINE W REFLEX MICROSCOPIC
Bilirubin Urine: NEGATIVE
Glucose, UA: NEGATIVE mg/dL
Ketones, ur: NEGATIVE mg/dL
Nitrite: NEGATIVE
Protein, ur: >=300 mg/dL — AB
Specific Gravity, Urine: 1.016 (ref 1.005–1.030)
WBC, UA: >50 WBC/hpf (ref 0–5)
pH: 6 (ref 5.0–8.0)

## 2022-08-08 LAB — CK: Total CK: 1265 U/L — ABNORMAL HIGH (ref 38–234)

## 2022-08-08 LAB — CBG MONITORING, ED: Glucose-Capillary: 191 mg/dL — ABNORMAL HIGH (ref 70–99)

## 2022-08-08 MED ORDER — INSULIN ASPART 100 UNIT/ML IJ SOLN
0.0000 [IU] | INTRAMUSCULAR | Status: DC
  Administered 2022-08-09 (×2): 2 [IU] via SUBCUTANEOUS
  Administered 2022-08-09 (×2): 1 [IU] via SUBCUTANEOUS

## 2022-08-08 MED ORDER — SODIUM CHLORIDE 0.9 % IV SOLN
1.0000 g | Freq: Every day | INTRAVENOUS | Status: DC
  Administered 2022-08-09 – 2022-08-10 (×3): 1 g via INTRAVENOUS
  Filled 2022-08-08 (×3): qty 10

## 2022-08-08 MED ORDER — SODIUM CHLORIDE 0.9 % IV BOLUS
1000.0000 mL | Freq: Once | INTRAVENOUS | Status: AC
  Administered 2022-08-08: 1000 mL via INTRAVENOUS

## 2022-08-08 MED ORDER — LABETALOL HCL 5 MG/ML IV SOLN: 5.0000 mg | INTRAVENOUS | Status: DC | PRN

## 2022-08-08 MED ORDER — SODIUM CHLORIDE 0.9 % IV SOLN: INTRAVENOUS | Status: DC

## 2022-08-08 MED ORDER — ENOXAPARIN SODIUM 40 MG/0.4ML IJ SOSY
40.0000 mg | PREFILLED_SYRINGE | Freq: Every day | INTRAMUSCULAR | Status: DC
  Administered 2022-08-09 – 2022-08-11 (×3): 40 mg via SUBCUTANEOUS
  Filled 2022-08-08 (×3): qty 0.4

## 2022-08-08 NOTE — ED Triage Notes (Signed)
Pt to ED via EMS from home. Pt had mechanical fall at 8am this morning, unwitnessed, pt found face down in floor and was on floor for about 4 hours. Pt has neuropathy to both legs. Pt hit her head / left forehead on knob on dresser. NO LOC. No blood thinners. Pt c/o pain where she hit her face and bilateral knee pain.   EMS Vitals: 168/72 78 HR 16 RR 98% RA 184 CBG

## 2022-08-08 NOTE — H&P (Incomplete)
History and Physical  [name] ZOX:[id number] DOB: Jul 20, 1950 DOA: 08/08/2022  Referring physician: Arthor Captain, PA-EDP  PCP: Laurann Montana, MD  Outpatient Specialists: Podiatry, wound care Patient coming from: Home  Chief Complaint: Progressive weakness and multiple falls.   HPI: [name] is a 72 y.o. female with medical history significant for ductal carcinoma in situ of left breast, DM2, obesity who presented to [facility] ED from home via EMS due to progressive weakness and recurrent falls.  Reportedly, per her sister, she fell several days ago then again yesterday.  After her fall, the patient sat down on the floor for almost 10 hours, could not get up without assistance.  She fell again this morning and was found facedown on the floor where she remained for about 4 hours, covered in urine and feces, unable to get herself up.  EMS was activated.  Upon arrival she complains of generalized weakness and neuropathy in her legs.  No loss of consciousness reported.  In the ED, UA is positive for pyuria, leukocytosis, AKI, elevated CPK, electrolytes abnormalities.  Noncontrast CT head and CT cervical spine without contrast revealed the following findings: 16 x 5 mm infarct within the left corona radiata/caudate nucleus, new from the prior brain MRI of 06/05/2019 but otherwise age indeterminate. Consider a brain MRI for further evaluation. No evidence of acute fracture to the cervical spine.   Additionally, chest x-ray revealed suspected acute fracture of the anterior right eighth rib.   Brain MRI ordered, EDP discussed the case with neurology who recommended admission for stroke workup.  Admitted by [facility], hospitalist service to telemetry medical unit as inpatient status.  ED Course: Temperature 98.2.  BP 141/100, pulse 75, respiration rate 19, O2 saturation 100% on room air.  Lab studies markable for serum sodium 134, serum bicarb 21, glucose 172, BUN 45, creatinine 2.29, albumin 3.4,  AST 47, GFR 22, CPK 1265.  Review of Systems: Review of systems as noted in the HPI. All other systems reviewed and are negative.   Past Medical History:  Diagnosis Date   Breast cancer (HCC) 12/2019   left breast DCIS   CKD (chronic kidney disease) 06/07/2016   Stage 2, GFR 60-89 ml/min   Diabetic polyneuropathy 06/07/2016   Dyslipidemia    Endometrial adenocarcinoma 2012   Fever blister    Hyperlipidemia    Hypertension, essential 06/07/2016   Major depressive disorder    Mixed dyslipidemia 06/07/2016   MRSA infection 10/2019   left foot wound   Obstructive sleep apnea 06/07/2016   does not use CPAP   Peripheral neuropathy    Retinopathy    Severe obesity    Skin ulcer of left foot with fat layer exposed 11/02/2016   Stroke    Asymptomatic, discovered via neuroimaging; small infarct in left parietal lobe; also concern for small b/l frontal infarcts   Tachycardia 07/19/2017   Type 2 diabetes mellitus with hyperglycemia, with long-term current use of insulin 06/07/2016   Unilateral primary osteoarthritis, right knee 04/16/2019   Past Surgical History:  Procedure Laterality Date   ADRENALECTOMY     BREAST LUMPECTOMY WITH RADIOACTIVE SEED LOCALIZATION Left 02/25/2020   Procedure: LEFT BREAST LUMPECTOMY WITH RADIOACTIVE SEED LOCALIZATION;  Surgeon: Abigail Miyamoto, MD;  Location: [facility];  Service: General;  Laterality: Left;   BUNIONECTOMY     CARDIAC CATHETERIZATION     CERVICAL SPINE SURGERY     CHOLECYSTECTOMY     DILATION AND CURETTAGE OF UTERUS  LAPAROSCOPIC SALPINGOOPHERECTOMY     THORACOTOMY     TONSILLECTOMY      Social History:  reports that she has never smoked. She has never used smokeless tobacco. She reports that she does not drink alcohol and does not use drugs.   Allergies  Allergen Reactions   Amitriptyline Other (See Comments)   Ciprofibrate Other (See Comments)   Ciprofloxacin Other (See Comments)   Pristiq [Desvenlafaxine] Other  (See Comments)   Sulfur     Other Reaction(s): Unknown   Penicillins Rash   Sulfa Antibiotics Rash   Sulfamethoxazole Rash    Family History  Problem Relation Age of Onset   Hypertension Mother    Alzheimer's disease Mother    Heart attack Father    CAD Father    Alzheimer's disease Father    Bladder Cancer Father        dx late 57s   Diverticulitis Sister    Obesity Sister    Hypertension Sister    Voice disorder Brother    Breast cancer Paternal Aunt 83   Prostate cancer Paternal Uncle        dx mid 12s      Prior to Admission medications   Medication Sig Start Date End Date Taking? Authorizing Provider  buPROPion (WELLBUTRIN XL) 300 MG 24 hr tablet Take 300 mg by mouth daily.    [provider]  carvedilol (COREG) 6.25 MG tablet Take 6.25 mg by mouth 2 (two) times daily. 12/23/20   [provider]  doxycycline (VIBRAMYCIN) 100 MG capsule Take 1 capsule (100 mg total) by mouth 2 (two) times daily. 09/28/21   Vivi Barrack, DPM  escitalopram (LEXAPRO) 10 MG tablet Take 10 mg by mouth daily. 10/29/20   [provider]  escitalopram (LEXAPRO) 20 MG tablet Take 20 mg by mouth daily. 06/23/21   [provider]  FLUoxetine (PROZAC) 40 MG capsule Take 40 mg by mouth daily.  12/21/17   [provider]  furosemide (LASIX) 40 MG tablet Take 40 mg by mouth daily as needed.    [provider]  insulin NPH-regular Human (NOVOLIN 70/30) (70-30) 100 UNIT/ML injection Inject into the skin. 24 ml in the morning and 20 ml in the evening    [provider]  losartan (COZAAR) 50 MG tablet Take 50 mg by mouth every morning. 06/22/22   [provider]  metFORMIN (GLUCOPHAGE-XR) 500 MG 24 hr tablet Take 1,000 mg by mouth in the morning and at bedtime. PT TAKES 2000 MG DAILY 02/07/18   [provider]  MONOJECT INSULIN SYRINGE 31G X 5/16" 1 ML MISC See admin instructions. 05/28/19   [provider]  mupirocin  ointment (BACTROBAN) 2 % Apply 1 application topically daily.    [provider]  nutrition supplement, JUVEN, (JUVEN) PACK Take 1 packet by mouth 2 (two) times daily between meals. 07/18/22   Vivi Barrack, DPM  nystatin cream (MYCOSTATIN) 1 APPLICATION TWICE A DAY EXTERNALLY 04/30/18   [provider]  ONETOUCH VERIO test strip  01/30/18   [provider]  pravastatin (PRAVACHOL) 40 MG tablet Take 1 tablet once a day for cholesterol    [provider]  tamoxifen (NOLVADEX) 20 MG tablet TAKE ONE TABLET BY MOUTH EVERY MORNING 07/21/22   Serena Croissant, MD    Physical Exam: BP 119/71 (BP Location: Left Arm)   Pulse 71   Temp 98.2 F (36.8 C) (Oral)   Resp 16   SpO2 100%  General: 72 y.o. year-old female well developed well nourished in no acute distress.  Alert and interactive. Cardiovascular: Regular rate and rhythm with no rubs or gallops.  No thyromegaly or JVD noted.  Trace lower extremity edema bilaterally. Respiratory: Clear to auscultation with no wheezes or rales. Poor inspiratory effort. Abdomen: Soft nontender nondistended with normal bowel sounds x4 quadrants. Muskuloskeletal: No cyanosis, clubbing or edema noted bilaterally Neuro: CN II-XII intact, strength, sensation, reflexes Skin: No rashes Psychiatry: Mood is appropriate for condition and setting          Labs on Admission:  Basic Metabolic Panel: Recent Labs  Lab 08/08/22 1252  NA 134*  K 3.8  CL 99  CO2 21*  GLUCOSE 172*  BUN 45*  CREATININE 2.29*  CALCIUM 9.2   Liver Function Tests: Recent Labs  Lab 08/08/22 1252  AST 47*  ALT 27  ALKPHOS 46  BILITOT 0.7  PROT 8.0  ALBUMIN 3.4*   No results for input(s): "LIPASE", "AMYLASE" in the last 168 hours. No results for input(s): "AMMONIA" in the last 168 hours. CBC: Recent Labs  Lab 08/08/22 1252  WBC 14.8*  NEUTROABS 11.6*  HGB 12.1  HCT 36.7  MCV 100.3*  PLT 323   Cardiac Enzymes: Recent Labs  Lab  08/08/22 1252  CKTOTAL 1,265*    BNP (last 3 results) No results for input(s): "BNP" in the last 8760 hours.  ProBNP (last 3 results) No results for input(s): "PROBNP" in the last 8760 hours.  CBG: Recent Labs  Lab 08/08/22 1613  GLUCAP 191*    Radiological Exams on Admission: MR BRAIN WO CONTRAST  Result Date: 08/08/2022 CLINICAL DATA:  Altered mental status EXAM: MRI HEAD WITHOUT CONTRAST TECHNIQUE: Multiplanar, multiecho pulse sequences of the brain and surrounding structures were obtained without intravenous contrast. COMPARISON:  06/05/2019 FINDINGS: Brain: Multifocal bilateral acute ischemia, predominantly affecting the deep gray matter. There also 2 cortical foci within the right MCA territory. There are old bilateral MCA territory infarcts. No acute or chronic hemorrhage. There is multifocal hyperintense T2-weighted signal within the white matter. Generalized volume loss. The midline structures are normal. Vascular: Major flow voids are preserved. Skull and upper cervical spine: Normal calvarium and skull base. Visualized upper cervical spine and soft tissues are normal. Sinuses/Orbits:No paranasal sinus fluid levels or advanced mucosal thickening. No mastoid or middle ear effusion. Normal orbits. IMPRESSION: 1. Multifocal bilateral acute ischemia, predominantly affecting the deep gray matter. There also 2 cortical foci within the right MCA territory. No hemorrhage or mass effect. 2. Old bilateral MCA territory infarcts. 3. Mild volume loss and chronic ischemic microangiopathy. Electronically Signed   By: Deatra Robinson M.D.   On: 08/08/2022 21:34   DG Ribs Unilateral W/Chest Right  Result Date: 08/08/2022 CLINICAL DATA:  De Hollingshead 3 times in the past week with right lower anterior rib pain EXAM: RIGHT RIBS AND CHEST - 3+ VIEW COMPARISON:  06/04/2010 FINDINGS: Suspected acute fracture of the anterior right eighth rib. There is no evidence of pneumothorax or pleural effusion. Both lungs  are clear. Stable cardiomediastinal silhouette. Aortic atherosclerotic calcification. Cervical spine fusion hardware IMPRESSION: Suspected acute fracture of the anterior right eighth rib. Electronically Signed   By: Minerva Fester M.D.   On: 08/08/2022 20:14   CT Head Wo Contrast  Result Date: 08/08/2022 CLINICAL DATA:  Provided history: Polytrauma, blunt. Additional history provided: Fall. Patient found down. EXAM: CT HEAD WITHOUT CONTRAST CT CERVICAL SPINE WITHOUT CONTRAST TECHNIQUE: Multidetector CT imaging of the head and cervical  spine was performed following the standard protocol without intravenous contrast. Multiplanar CT image reconstructions of the cervical spine were also generated. RADIATION DOSE REDUCTION: This exam was performed according to the departmental dose-optimization program which includes automated exposure control, adjustment of the mA and/or kV according to patient size and/or use of iterative reconstruction technique. COMPARISON:  Brain MRI 06/05/2019.  Head CT 05/14/2019. FINDINGS: CT HEAD FINDINGS Brain: Generalized cerebral atrophy. Redemonstrated chronic cortically-based infarcts within the bilateral frontal lobes and left parietal lobe. 16 x 5 mm age-indeterminate infarct centered within the left corona radiata and caudate nucleus, new from the prior brain MRI of 06/05/2019 but otherwise age indeterminate. Background mild patchy and ill-defined hypoattenuation within the cerebral white matter, nonspecific but compatible with chronic small ischemic disease. There is no acute intracranial hemorrhage. No extra-axial fluid collection. No evidence of an intracranial mass. No midline shift. Vascular: No hyperdense vessel.  Atherosclerotic calcifications. Skull: No fracture or aggressive osseous lesion. Sinuses/Orbits: No mass or acute finding within the imaged orbits. No significant paranasal sinus disease. CT CERVICAL SPINE FINDINGS Alignment: No significant spondylolisthesis. Skull  base and vertebrae: The basion-dental and atlanto-dental intervals are maintained.No evidence of acute fracture to the cervical spine. Soft tissues and spinal canal: No prevertebral fluid or swelling. No visible canal hematoma. Disc levels: Prior C5-C6 ACDF. Solid arthrodesis across the C5-C6 disc space. No evidence of acute hardware compromise. Background cervical spondylosis with multilevel disc space narrowing and disc bulges/central disc protrusions. Disc space narrowing is greatest at C4-C5 (moderately advanced at this level). No appreciable high-grade spinal canal stenosis. No significant bony neural foraminal narrowing. Upper chest: No consolidation within the imaged lung apices. No visible pneumothorax. IMPRESSION: CT head: 1. 16 x 5 mm infarct within the left corona radiata/caudate nucleus, new from the prior brain MRI of 06/05/2019 but otherwise age indeterminate. Consider a brain MRI for further evaluation. 2. No acute posttraumatic intracranial findings. 3. Redemonstrated chronic cortically-based infarcts within bilateral frontal and left parietal lobes. 4. Background mild cerebral white matter chronic small vessel ischemic disease. 5. Generalized cerebral atrophy. CT cervical spine: 1. No evidence of acute fracture to the cervical spine. 2. Prior C5-C6 ACDF. 3. Cervical spondylosis as described. Electronically Signed   By: Jackey Loge D.O.   On: 08/08/2022 14:05   CT Cervical Spine Wo Contrast  Result Date: 08/08/2022 CLINICAL DATA:  Provided history: Polytrauma, blunt. Additional history provided: Fall. Patient found down. EXAM: CT HEAD WITHOUT CONTRAST CT CERVICAL SPINE WITHOUT CONTRAST TECHNIQUE: Multidetector CT imaging of the head and cervical spine was performed following the standard protocol without intravenous contrast. Multiplanar CT image reconstructions of the cervical spine were also generated. RADIATION DOSE REDUCTION: This exam was performed according to the departmental  dose-optimization program which includes automated exposure control, adjustment of the mA and/or kV according to patient size and/or use of iterative reconstruction technique. COMPARISON:  Brain MRI 06/05/2019.  Head CT 05/14/2019. FINDINGS: CT HEAD FINDINGS Brain: Generalized cerebral atrophy. Redemonstrated chronic cortically-based infarcts within the bilateral frontal lobes and left parietal lobe. 16 x 5 mm age-indeterminate infarct centered within the left corona radiata and caudate nucleus, new from the prior brain MRI of 06/05/2019 but otherwise age indeterminate. Background mild patchy and ill-defined hypoattenuation within the cerebral white matter, nonspecific but compatible with chronic small ischemic disease. There is no acute intracranial hemorrhage. No extra-axial fluid collection. No evidence of an intracranial mass. No midline shift. Vascular: No hyperdense vessel.  Atherosclerotic calcifications. Skull: No fracture or aggressive osseous lesion.  Sinuses/Orbits: No mass or acute finding within the imaged orbits. No significant paranasal sinus disease. CT CERVICAL SPINE FINDINGS Alignment: No significant spondylolisthesis. Skull base and vertebrae: The basion-dental and atlanto-dental intervals are maintained.No evidence of acute fracture to the cervical spine. Soft tissues and spinal canal: No prevertebral fluid or swelling. No visible canal hematoma. Disc levels: Prior C5-C6 ACDF. Solid arthrodesis across the C5-C6 disc space. No evidence of acute hardware compromise. Background cervical spondylosis with multilevel disc space narrowing and disc bulges/central disc protrusions. Disc space narrowing is greatest at C4-C5 (moderately advanced at this level). No appreciable high-grade spinal canal stenosis. No significant bony neural foraminal narrowing. Upper chest: No consolidation within the imaged lung apices. No visible pneumothorax. IMPRESSION: CT head: 1. 16 x 5 mm infarct within the left corona  radiata/caudate nucleus, new from the prior brain MRI of 06/05/2019 but otherwise age indeterminate. Consider a brain MRI for further evaluation. 2. No acute posttraumatic intracranial findings. 3. Redemonstrated chronic cortically-based infarcts within bilateral frontal and left parietal lobes. 4. Background mild cerebral white matter chronic small vessel ischemic disease. 5. Generalized cerebral atrophy. CT cervical spine: 1. No evidence of acute fracture to the cervical spine. 2. Prior C5-C6 ACDF. 3. Cervical spondylosis as described. Electronically Signed   By: Jackey Loge D.O.   On: 08/08/2022 14:05    EKG: I independently viewed the EKG done and my findings are as followed: Normal sinus rhythm rate of 77.  Nonspecific ST-T changes.  QTc 511.  Assessment/Plan Present on Admission: **None**  Principal Problem:   Generalized weakness  Generalized weakness, likely secondary to CVA MRI revealed Multifocal bilateral acute ischemia, predominantly affecting the deep gray matter.  There also 2 cortical foci noted within the right MCA territory no hemorrhage or mass effect.  Old bilateral MCA territory infarcts.  Mild volume loss and chronic ischemic microangiopathy.  Multifocal bilateral acute ischemic CVA, POA Personally reviewed MRI brain which revealed the above findings Ongoing CVA workup 2D echo with bubble study MRA head without contrast Bilateral carotid artery Doppler ultrasound Fasting lipid panel, A1c Neurochecks every 4 hours PT/OT/speech therapist assessment Full/aspiration precautions Permissive hypertension treat SBP greater than 220 or DBP greater than 120 IV labetalol as needed with parameters Neurology/stroke team consulted Full dose aspirin 300 mg rectal now DAPT 81 mg asa and Plavix 75 mg daily x 21 days then antiplatelet monotherapy alone. Lipitor 80 mg daily  Presumptive UTI, POA Presented with leukocytosis 14.8 and UA positive for pyuria. Started Rocephin For  urine culture for ID and sensitivities  AKI, likely prerenal in the setting of dehydration from poor oral intake Baseline creatinine appears to be 1.4 Presented with creatinine of 2.29 with GFR 22 Avoid nephrotoxic agents, dehydration, and hypotension Start gentle IV fluid hydration NS at 75 cc/h x 2 days. Monitor urine output Repeat BMP in the morning  Mild non-anion gap metabolic acidosis Serum bicarb 21 Anion gap 14 Gentle IV fluid hydration NS at 75 cc/h x 2 days.  Hypovolemic hyponatremia Serum sodium 132 IV fluid with NS. Repeat BMP in the morning.  Isolated elevated AST AST 47, ALT 27, no reported history of alcohol use. Monitor for now  Rhabdomyolysis due to prolonged contact with the hard floor. CPK 1265 Continue IV fluid hydration NS at 75 cc/h.  Type 2 diabetes with hyperglycemia Obtain hemoglobin A1c Start insulin sliding scale every 4 hours while NPO. N.p.o. until passes swallow evaluation.  Obesity Recommend weight loss outpatient with regular physical activity and healthy dieting.  Right 8th rib fracture post fall As needed analgesics     DVT prophylaxis: Subcu Lovenox daily  Code Status: Full code  Family Communication: None at bedside  Disposition Plan: Admitted to telemetry medical unit  Consults called: Neurology, Dr. Derry Lory  Admission status: Inpatient status.   Status is: Inpatient The patient requires at least 2 midnights for further evaluation and treatment of present condition.   Darlin Drop MD [facility] Pager [phone]  If 7PM-7AM, please contact night-coverage [email or web address] Password [facility]  08/08/2022, 11:34 PM

## 2022-08-08 NOTE — ED Notes (Signed)
ED TO INPATIENT HANDOFF REPORT  ED Nurse Name and Phone #: Fredric Mare [phone]  S Name/Age/Gender [name] 72 y.o. female Room/Bed: 029C/029C  Code Status   Code Status: Not on file  Home/SNF/Other Home Patient oriented to: self, place, time, and situation  Is this baseline? Yes  - difficulty assessing orientation as pt has difficulty finding words. Pt's family at bedside states this is normal over the past few weeks.   Triage Complete: Triage complete  Chief Complaint Generalized weakness [R53.1]  Triage Note Pt to ED via EMS from home. Pt had mechanical fall at 8am this morning, unwitnessed, pt found face down in floor and was on floor for about 4 hours. Pt has neuropathy to both legs. Pt hit her head / left forehead on knob on dresser. NO LOC. No blood thinners. Pt c/o pain where she hit her face and bilateral knee pain.   EMS Vitals: 168/72 78 HR 16 RR 98% RA 184 CBG   Allergies Allergies  Allergen Reactions   Amitriptyline Other (See Comments)   Ciprofibrate Other (See Comments)   Ciprofloxacin Other (See Comments)   Pristiq [Desvenlafaxine] Other (See Comments)   Sulfur     Other Reaction(s): Unknown   Penicillins Rash   Sulfa Antibiotics Rash   Sulfamethoxazole Rash    Level of Care/Admitting Diagnosis ED Disposition     ED Disposition  Admit   Condition  --   Comment  Hospital Area: [facility] [[id number]]  Level of Care: Telemetry Medical [104]  May admit patient to [facility] or [facility] if equivalent level of care is available:: No  Covid Evaluation: Asymptomatic - no recent exposure (last 10 days) testing not required  Diagnosis: Generalized weakness [454098]  Admitting Physician: Darlin Drop [[id number]]  Attending Physician: Darlin Drop [[id number]]  Certification:: I certify this patient will need inpatient services for at least 2 midnights  Estimated Length of Stay: 2          B Medical/Surgery History Past  Medical History:  Diagnosis Date   Breast cancer (HCC) 12/2019   left breast DCIS   CKD (chronic kidney disease) 06/07/2016   Stage 2, GFR 60-89 ml/min   Diabetic polyneuropathy 06/07/2016   Dyslipidemia    Endometrial adenocarcinoma 2012   Fever blister    Hyperlipidemia    Hypertension, essential 06/07/2016   Major depressive disorder    Mixed dyslipidemia 06/07/2016   MRSA infection 10/2019   left foot wound   Obstructive sleep apnea 06/07/2016   does not use CPAP   Peripheral neuropathy    Retinopathy    Severe obesity    Skin ulcer of left foot with fat layer exposed 11/02/2016   Stroke    Asymptomatic, discovered via neuroimaging; small infarct in left parietal lobe; also concern for small b/l frontal infarcts   Tachycardia 07/19/2017   Type 2 diabetes mellitus with hyperglycemia, with long-term current use of insulin 06/07/2016   Unilateral primary osteoarthritis, right knee 04/16/2019   Past Surgical History:  Procedure Laterality Date   ADRENALECTOMY     BREAST LUMPECTOMY WITH RADIOACTIVE SEED LOCALIZATION Left 02/25/2020   Procedure: LEFT BREAST LUMPECTOMY WITH RADIOACTIVE SEED LOCALIZATION;  Surgeon: Abigail Miyamoto, MD;  Location: [facility];  Service: General;  Laterality: Left;   BUNIONECTOMY     CARDIAC CATHETERIZATION     CERVICAL SPINE SURGERY     CHOLECYSTECTOMY     DILATION AND CURETTAGE OF UTERUS  LAPAROSCOPIC SALPINGOOPHERECTOMY     THORACOTOMY     TONSILLECTOMY       A IV Location/Drains/Wounds Patient Lines/Drains/Airways Status     Active Line/Drains/Airways     Name Placement date Placement time Site Days   Peripheral IV 08/08/22 20 G Anterior;Right Forearm 08/08/22  1259  Forearm  less than 1   Peripheral IV 08/08/22 18 G Anterior;Right Forearm 08/08/22  1736  Forearm  less than 1   Airway 02/25/20  0901  -- 895            Intake/Output Last 24 hours No intake or output data in the 24 hours ending 08/08/22  2045  Labs/Imaging Results for orders placed or performed during the hospital encounter of 08/08/22 (from the past 48 hour(s))  CBC with Differential     Status: Abnormal   Collection Time: 08/08/22 12:52 PM  Result Value Ref Range   WBC 14.8 (H) 4.0 - 10.5 K/uL   RBC 3.66 (L) 3.87 - 5.11 MIL/uL   Hemoglobin 12.1 12.0 - 15.0 g/dL   HCT 13.0 86.5 - 78.4 %   MCV 100.3 (H) 80.0 - 100.0 fL   MCH 33.1 26.0 - 34.0 pg   MCHC 33.0 30.0 - 36.0 g/dL   RDW 69.6 29.5 - 28.4 %   Platelets 323 150 - 400 K/uL   nRBC 0.0 0.0 - 0.2 %   Neutrophils Relative % 78 %   Neutro Abs 11.6 (H) 1.7 - 7.7 K/uL   Lymphocytes Relative 14 %   Lymphs Abs 2.1 0.7 - 4.0 K/uL   Monocytes Relative 7 %   Monocytes Absolute 1.0 0.1 - 1.0 K/uL   Eosinophils Relative 0 %   Eosinophils Absolute 0.0 0.0 - 0.5 K/uL   Basophils Relative 0 %   Basophils Absolute 0.0 0.0 - 0.1 K/uL   Immature Granulocytes 1 %   Abs Immature Granulocytes 0.10 (H) 0.00 - 0.07 K/uL    Comment: Performed at [facility], [address], [place], [place] [address]  Comprehensive metabolic panel     Status: Abnormal   Collection Time: 08/08/22 12:52 PM  Result Value Ref Range   Sodium 134 (L) 135 - 145 mmol/L   Potassium 3.8 3.5 - 5.1 mmol/L   Chloride 99 98 - 111 mmol/L   CO2 21 (L) 22 - 32 mmol/L   Glucose, Bld 172 (H) 70 - 99 mg/dL    Comment: Glucose reference range applies only to samples taken after fasting for at least 8 hours.   BUN 45 (H) 8 - 23 mg/dL   Creatinine, Ser 0.10 (H) 0.44 - 1.00 mg/dL   Calcium 9.2 8.9 - 27.2 mg/dL   Total Protein 8.0 6.5 - 8.1 g/dL   Albumin 3.4 (L) 3.5 - 5.0 g/dL   AST 47 (H) 15 - 41 U/L   ALT 27 0 - 44 U/L   Alkaline Phosphatase 46 38 - 126 U/L   Total Bilirubin 0.7 0.3 - 1.2 mg/dL   GFR, Estimated 22 (L) >60 mL/min    Comment: (NOTE) Calculated using the CKD-EPI Creatinine Equation (2021)    Anion gap 14 5 - 15    Comment: Performed at [facility], [address],  [place], [place] [address]  CK     Status: Abnormal   Collection Time: 08/08/22 12:52 PM  Result Value Ref Range   Total CK 1,265 (H) 38 - 234 U/L    Comment: Performed at [facility], [address], [place], [place] [address]  CBG monitoring, ED     Status: Abnormal   Collection Time: 08/08/22  4:13 PM  Result Value Ref Range   Glucose-Capillary 191 (H) 70 - 99 mg/dL    Comment: Glucose reference range applies only to samples taken after fasting for at least 8 hours.  Urinalysis, Routine w reflex microscopic -Urine, Clean Catch     Status: Abnormal   Collection Time: 08/08/22  6:52 PM  Result Value Ref Range   Color, Urine AMBER (A) YELLOW    Comment: BIOCHEMICALS MAY BE AFFECTED BY COLOR   APPearance CLOUDY (A) CLEAR   Specific Gravity, Urine 1.016 1.005 - 1.030   pH 6.0 5.0 - 8.0   Glucose, UA NEGATIVE NEGATIVE mg/dL   Hgb urine dipstick MODERATE (A) NEGATIVE   Bilirubin Urine NEGATIVE NEGATIVE   Ketones, ur NEGATIVE NEGATIVE mg/dL   Protein, ur >=098 (A) NEGATIVE mg/dL   Nitrite NEGATIVE NEGATIVE   Leukocytes,Ua LARGE (A) NEGATIVE   RBC / HPF 6-10 0 - 5 RBC/hpf   WBC, UA >50 0 - 5 WBC/hpf   Bacteria, UA MANY (A) NONE SEEN   Squamous Epithelial / HPF 6-10 0 - 5 /HPF   Non Squamous Epithelial 0-5 (A) NONE SEEN    Comment: Performed at [facility], [address], [place], [place] [address]   DG Ribs Unilateral W/Chest Right  Result Date: 08/08/2022 CLINICAL DATA:  De Hollingshead 3 times in the past week with right lower anterior rib pain EXAM: RIGHT RIBS AND CHEST - 3+ VIEW COMPARISON:  06/04/2010 FINDINGS: Suspected acute fracture of the anterior right eighth rib. There is no evidence of pneumothorax or pleural effusion. Both lungs are clear. Stable cardiomediastinal silhouette. Aortic atherosclerotic calcification. Cervical spine fusion hardware IMPRESSION: Suspected acute fracture of the anterior right eighth rib. Electronically Signed   By: Minerva Fester M.D.   On:  08/08/2022 20:14   CT Head Wo Contrast  Result Date: 08/08/2022 CLINICAL DATA:  Provided history: Polytrauma, blunt. Additional history provided: Fall. Patient found down. EXAM: CT HEAD WITHOUT CONTRAST CT CERVICAL SPINE WITHOUT CONTRAST TECHNIQUE: Multidetector CT imaging of the head and cervical spine was performed following the standard protocol without intravenous contrast. Multiplanar CT image reconstructions of the cervical spine were also generated. RADIATION DOSE REDUCTION: This exam was performed according to the departmental dose-optimization program which includes automated exposure control, adjustment of the mA and/or kV according to patient size and/or use of iterative reconstruction technique. COMPARISON:  Brain MRI 06/05/2019.  Head CT 05/14/2019. FINDINGS: CT HEAD FINDINGS Brain: Generalized cerebral atrophy. Redemonstrated chronic cortically-based infarcts within the bilateral frontal lobes and left parietal lobe. 16 x 5 mm age-indeterminate infarct centered within the left corona radiata and caudate nucleus, new from the prior brain MRI of 06/05/2019 but otherwise age indeterminate. Background mild patchy and ill-defined hypoattenuation within the cerebral white matter, nonspecific but compatible with chronic small ischemic disease. There is no acute intracranial hemorrhage. No extra-axial fluid collection. No evidence of an intracranial mass. No midline shift. Vascular: No hyperdense vessel.  Atherosclerotic calcifications. Skull: No fracture or aggressive osseous lesion. Sinuses/Orbits: No mass or acute finding within the imaged orbits. No significant paranasal sinus disease. CT CERVICAL SPINE FINDINGS Alignment: No significant spondylolisthesis. Skull base and vertebrae: The basion-dental and atlanto-dental intervals are maintained.No evidence of acute fracture to the cervical spine. Soft tissues and spinal canal: No prevertebral fluid or swelling. No visible canal hematoma. Disc levels:  Prior  C5-C6 ACDF. Solid arthrodesis across the C5-C6 disc space. No evidence of acute hardware compromise. Background cervical spondylosis with multilevel disc space narrowing and disc bulges/central disc protrusions. Disc space narrowing is greatest at C4-C5 (moderately advanced at this level). No appreciable high-grade spinal canal stenosis. No significant bony neural foraminal narrowing. Upper chest: No consolidation within the imaged lung apices. No visible pneumothorax. IMPRESSION: CT head: 1. 16 x 5 mm infarct within the left corona radiata/caudate nucleus, new from the prior brain MRI of 06/05/2019 but otherwise age indeterminate. Consider a brain MRI for further evaluation. 2. No acute posttraumatic intracranial findings. 3. Redemonstrated chronic cortically-based infarcts within bilateral frontal and left parietal lobes. 4. Background mild cerebral white matter chronic small vessel ischemic disease. 5. Generalized cerebral atrophy. CT cervical spine: 1. No evidence of acute fracture to the cervical spine. 2. Prior C5-C6 ACDF. 3. Cervical spondylosis as described. Electronically Signed   By: Jackey Loge D.O.   On: 08/08/2022 14:05   CT Cervical Spine Wo Contrast  Result Date: 08/08/2022 CLINICAL DATA:  Provided history: Polytrauma, blunt. Additional history provided: Fall. Patient found down. EXAM: CT HEAD WITHOUT CONTRAST CT CERVICAL SPINE WITHOUT CONTRAST TECHNIQUE: Multidetector CT imaging of the head and cervical spine was performed following the standard protocol without intravenous contrast. Multiplanar CT image reconstructions of the cervical spine were also generated. RADIATION DOSE REDUCTION: This exam was performed according to the departmental dose-optimization program which includes automated exposure control, adjustment of the mA and/or kV according to patient size and/or use of iterative reconstruction technique. COMPARISON:  Brain MRI 06/05/2019.  Head CT 05/14/2019. FINDINGS: CT HEAD  FINDINGS Brain: Generalized cerebral atrophy. Redemonstrated chronic cortically-based infarcts within the bilateral frontal lobes and left parietal lobe. 16 x 5 mm age-indeterminate infarct centered within the left corona radiata and caudate nucleus, new from the prior brain MRI of 06/05/2019 but otherwise age indeterminate. Background mild patchy and ill-defined hypoattenuation within the cerebral white matter, nonspecific but compatible with chronic small ischemic disease. There is no acute intracranial hemorrhage. No extra-axial fluid collection. No evidence of an intracranial mass. No midline shift. Vascular: No hyperdense vessel.  Atherosclerotic calcifications. Skull: No fracture or aggressive osseous lesion. Sinuses/Orbits: No mass or acute finding within the imaged orbits. No significant paranasal sinus disease. CT CERVICAL SPINE FINDINGS Alignment: No significant spondylolisthesis. Skull base and vertebrae: The basion-dental and atlanto-dental intervals are maintained.No evidence of acute fracture to the cervical spine. Soft tissues and spinal canal: No prevertebral fluid or swelling. No visible canal hematoma. Disc levels: Prior C5-C6 ACDF. Solid arthrodesis across the C5-C6 disc space. No evidence of acute hardware compromise. Background cervical spondylosis with multilevel disc space narrowing and disc bulges/central disc protrusions. Disc space narrowing is greatest at C4-C5 (moderately advanced at this level). No appreciable high-grade spinal canal stenosis. No significant bony neural foraminal narrowing. Upper chest: No consolidation within the imaged lung apices. No visible pneumothorax. IMPRESSION: CT head: 1. 16 x 5 mm infarct within the left corona radiata/caudate nucleus, new from the prior brain MRI of 06/05/2019 but otherwise age indeterminate. Consider a brain MRI for further evaluation. 2. No acute posttraumatic intracranial findings. 3. Redemonstrated chronic cortically-based infarcts within  bilateral frontal and left parietal lobes. 4. Background mild cerebral white matter chronic small vessel ischemic disease. 5. Generalized cerebral atrophy. CT cervical spine: 1. No evidence of acute fracture to the cervical spine. 2. Prior C5-C6 ACDF. 3. Cervical spondylosis as described. Electronically Signed   By: Jackey Loge D.O.  On: 08/08/2022 14:05    Pending Labs Unresulted Labs (From admission, onward)     Start     Ordered   08/08/22 2023  Urine Culture  Once,   R       Question:  Indication  Answer:  Dysuria   08/08/22 2023            Vitals/Pain Today's Vitals   08/08/22 1249 08/08/22 1603 08/08/22 1909 08/08/22 1909  BP:  (!) 158/67 (!) 198/72   Pulse:  85 75   Resp:  18 17   Temp:  (!) 97.5 F (36.4 C) 97.8 F (36.6 C)   TempSrc:  Oral Oral   SpO2:  94% 100%   PainSc: 10-Worst pain ever   10-Worst pain ever    Isolation Precautions No active isolations  Medications Medications  sodium chloride 0.9 % bolus 1,000 mL (has no administration in time range)  sodium chloride 0.9 % bolus 1,000 mL (0 mLs Intravenous Stopped 08/08/22 2027)    Mobility - HIGH FALL RISK PATIENT walks with person assist - hx of recent falls, 3 within the last week.      Focused Assessments Pulmonary Assessment Handoff:  Lung sounds:   O2 Device: Room Air      R Recommendations: See Admitting Provider Note  Report given to:   Additional Notes: -Suspected R 8th rib fx, Pending MRI. Does c/o right sided pain.

## 2022-08-08 NOTE — ED Notes (Signed)
Admitting Dr. Margo Aye informed pt is currently in MRI. Is assigned to bed 5N14.

## 2022-08-08 NOTE — ED Notes (Signed)
Patient transported to MRI 

## 2022-08-08 NOTE — ED Provider Triage Note (Addendum)
Emergency Medicine Provider Triage Evaluation Note  [name] , a 72 y.o. female  was evaluated in triage.  Pt complains of fall that occurred this morning.  Reports it was a mechanical fall due to chronic neuropathy.  Reports she stayed on the ground for about 4 hours until someone could help her up.  Not on anticoagulation.  Denies loss of consciousness.  Review of Systems  Positive: As above Negative: As above  Physical Exam  BP (!) 163/100   Pulse 77   Temp 97.6 F (36.4 C) (Oral)   Resp 16   SpO2 100%  Gen:   Awake, no distress   Resp:  Normal effort  MSK:   Moves extremities without difficulty  Other:    Medical Decision Making  Medically screening exam initiated at 12:57 PM.  Appropriate orders placed.  [name] was informed that the remainder of the evaluation will be completed by another provider, this initial triage assessment does not replace that evaluation, and the importance of remaining in the ED until their evaluation is complete.     Marita Kansas, PA-C 08/08/22 1258    Marita Kansas, PA-C 08/08/22 1258

## 2022-08-08 NOTE — ED Provider Notes (Signed)
[place] [facility]ERGENCY DEPARTMENT AT [facility] Provider Note   CSN: [id number] Arrival date & time: 08/08/22  1241     History  Chief Complaint  Patient presents with   [name] is a 72 y.o. female brought in for fall.  History is given by the patient and her sister.  She has been having increasing falls over the past several days.  Her sister reports that yesterday she fell off of her chair and sat on her bottom for almost 10 hours.  She fell overnight again and was found facedown on the floor covered in urine and feces and unable to get herself up.  She is complaining of generalized weakness.  She states that her legs are heavy and swollen.  She denies room spinning dizziness, speech difficulties, vision changes.  She denies new unilateral weakness.  Her sister reports that since May 8 they have been more vigilant about her taking her medication but apparently she has been noncompliant with medications due to depression and was supposed to have surgery on her left lower extremity but they had to discontinue it because her blood pressures were too high.  The patient is complaining of pain in her right rib cage after the falls.  She denies shortness of breath.  She did not lose consciousness.  She denies shortness of breath.  The history is provided by the patient and a relative. The history is limited by the condition of the patient.  Fall      Home Medications Prior to Admission medications   Medication Sig Start Date End Date Taking? Authorizing Provider  buPROPion (WELLBUTRIN XL) 300 MG 24 hr tablet Take 300 mg by mouth daily.    [provider]  carvedilol (COREG) 6.25 MG tablet Take 6.25 mg by mouth 2 (two) times daily. 12/23/20   [provider]  doxycycline (VIBRAMYCIN) 100 MG capsule Take 1 capsule (100 mg total) by mouth 2 (two) times daily. 09/28/21   Vivi Barrack, DPM  escitalopram (LEXAPRO) 10 MG tablet Take 10 mg by mouth  daily. 10/29/20   [provider]  escitalopram (LEXAPRO) 20 MG tablet Take 20 mg by mouth daily. 06/23/21   [provider]  FLUoxetine (PROZAC) 40 MG capsule Take 40 mg by mouth daily.  12/21/17   [provider]  furosemide (LASIX) 40 MG tablet Take 40 mg by mouth daily as needed.    [provider]  insulin NPH-regular Human (NOVOLIN 70/30) (70-30) 100 UNIT/ML injection Inject into the skin. 24 ml in the morning and 20 ml in the evening    [provider]  losartan (COZAAR) 50 MG tablet Take 50 mg by mouth every morning. 06/22/22   [provider]  metFORMIN (GLUCOPHAGE-XR) 500 MG 24 hr tablet Take 1,000 mg by mouth in the morning and at bedtime. PT TAKES 2000 MG DAILY 02/07/18   [provider]  MONOJECT INSULIN SYRINGE 31G X 5/16" 1 ML MISC See admin instructions. 05/28/19   [provider]  mupirocin ointment (BACTROBAN) 2 % Apply 1 application topically daily.    [provider]  nutrition supplement, JUVEN, (JUVEN) PACK Take 1 packet by mouth 2 (two) times daily between meals. 07/18/22   Vivi Barrack, DPM  nystatin cream (MYCOSTATIN) 1 APPLICATION TWICE A DAY EXTERNALLY 04/30/18   [provider]  ONETOUCH VERIO test strip  01/30/18   [provider]  pravastatin (PRAVACHOL) 40 MG tablet Take 1  tablet once a day for cholesterol    [provider]  tamoxifen (NOLVADEX) 20 MG tablet TAKE ONE TABLET BY MOUTH EVERY MORNING 07/21/22   Serena Croissant, MD      Allergies    Amitriptyline, Ciprofibrate, Ciprofloxacin, Pristiq [desvenlafaxine], Sulfur, Penicillins, Sulfa antibiotics, and Sulfamethoxazole    Review of Systems   Review of Systems  Physical Exam Updated Vital Signs BP (!) 158/67   Pulse 85   Temp (!) 97.5 F (36.4 C) (Oral)   Resp 18   SpO2 94%  Physical Exam Vitals and nursing note reviewed.  Constitutional:      General: She is not in acute distress.    Appearance: She  is well-developed. She is obese. She is not diaphoretic.  HENT:     Head: Normocephalic and atraumatic.     Right Ear: External ear normal.     Left Ear: External ear normal.     Nose: Nose normal.     Mouth/Throat:     Mouth: Mucous membranes are moist.  Eyes:     General: No scleral icterus.    Conjunctiva/sclera: Conjunctivae normal.  Cardiovascular:     Rate and Rhythm: Normal rate and regular rhythm.     Heart sounds: Normal heart sounds. No murmur heard.    No friction rub. No gallop.  Pulmonary:     Effort: Pulmonary effort is normal. No respiratory distress.     Breath sounds: Normal breath sounds.  Abdominal:     General: Bowel sounds are normal. There is no distension.     Palpations: Abdomen is soft. There is no mass.     Tenderness: There is no abdominal tenderness. There is no guarding.  Musculoskeletal:     Cervical back: Normal range of motion.  Skin:    General: Skin is warm and dry.  Neurological:     Mental Status: She is alert and oriented to person, place, and time.  Psychiatric:        Behavior: Behavior is slowed.    ED Results / Procedures / Treatments   Labs (all labs ordered are listed, but only abnormal results are displayed) Labs Reviewed  CBC WITH DIFFERENTIAL/PLATELET - Abnormal; Notable for the following components:      Result Value   WBC 14.8 (*)    RBC 3.66 (*)    MCV 100.3 (*)    Neutro Abs 11.6 (*)    Abs Immature Granulocytes 0.10 (*)    All other components within normal limits  COMPREHENSIVE METABOLIC PANEL - Abnormal; Notable for the following components:   Sodium 134 (*)    CO2 21 (*)    Glucose, Bld 172 (*)    BUN 45 (*)    Creatinine, Ser 2.29 (*)    Albumin 3.4 (*)    AST 47 (*)    GFR, Estimated 22 (*)    All other components within normal limits  CK - Abnormal; Notable for the following components:   Total CK 1,265 (*)    All other components within normal limits  CBG MONITORING, ED - Abnormal; Notable for the  following components:   Glucose-Capillary 191 (*)    All other components within normal limits  URINALYSIS, ROUTINE W REFLEX MICROSCOPIC    EKG None  Radiology CT Head Wo Contrast  Result Date: 08/08/2022 CLINICAL DATA:  Provided history: Polytrauma, blunt. Additional history provided: Fall. Patient found down. EXAM: CT HEAD WITHOUT CONTRAST CT CERVICAL SPINE WITHOUT CONTRAST TECHNIQUE: Multidetector CT imaging of  the head and cervical spine was performed following the standard protocol without intravenous contrast. Multiplanar CT image reconstructions of the cervical spine were also generated. RADIATION DOSE REDUCTION: This exam was performed according to the departmental dose-optimization program which includes automated exposure control, adjustment of the mA and/or kV according to patient size and/or use of iterative reconstruction technique. COMPARISON:  Brain MRI 06/05/2019.  Head CT 05/14/2019. FINDINGS: CT HEAD FINDINGS Brain: Generalized cerebral atrophy. Redemonstrated chronic cortically-based infarcts within the bilateral frontal lobes and left parietal lobe. 16 x 5 mm age-indeterminate infarct centered within the left corona radiata and caudate nucleus, new from the prior brain MRI of 06/05/2019 but otherwise age indeterminate. Background mild patchy and ill-defined hypoattenuation within the cerebral white matter, nonspecific but compatible with chronic small ischemic disease. There is no acute intracranial hemorrhage. No extra-axial fluid collection. No evidence of an intracranial mass. No midline shift. Vascular: No hyperdense vessel.  Atherosclerotic calcifications. Skull: No fracture or aggressive osseous lesion. Sinuses/Orbits: No mass or acute finding within the imaged orbits. No significant paranasal sinus disease. CT CERVICAL SPINE FINDINGS Alignment: No significant spondylolisthesis. Skull base and vertebrae: The basion-dental and atlanto-dental intervals are maintained.No evidence  of acute fracture to the cervical spine. Soft tissues and spinal canal: No prevertebral fluid or swelling. No visible canal hematoma. Disc levels: Prior C5-C6 ACDF. Solid arthrodesis across the C5-C6 disc space. No evidence of acute hardware compromise. Background cervical spondylosis with multilevel disc space narrowing and disc bulges/central disc protrusions. Disc space narrowing is greatest at C4-C5 (moderately advanced at this level). No appreciable high-grade spinal canal stenosis. No significant bony neural foraminal narrowing. Upper chest: No consolidation within the imaged lung apices. No visible pneumothorax. IMPRESSION: CT head: 1. 16 x 5 mm infarct within the left corona radiata/caudate nucleus, new from the prior brain MRI of 06/05/2019 but otherwise age indeterminate. Consider a brain MRI for further evaluation. 2. No acute posttraumatic intracranial findings. 3. Redemonstrated chronic cortically-based infarcts within bilateral frontal and left parietal lobes. 4. Background mild cerebral white matter chronic small vessel ischemic disease. 5. Generalized cerebral atrophy. CT cervical spine: 1. No evidence of acute fracture to the cervical spine. 2. Prior C5-C6 ACDF. 3. Cervical spondylosis as described. Electronically Signed   By: Jackey Loge D.O.   On: 08/08/2022 14:05   CT Cervical Spine Wo Contrast  Result Date: 08/08/2022 CLINICAL DATA:  Provided history: Polytrauma, blunt. Additional history provided: Fall. Patient found down. EXAM: CT HEAD WITHOUT CONTRAST CT CERVICAL SPINE WITHOUT CONTRAST TECHNIQUE: Multidetector CT imaging of the head and cervical spine was performed following the standard protocol without intravenous contrast. Multiplanar CT image reconstructions of the cervical spine were also generated. RADIATION DOSE REDUCTION: This exam was performed according to the departmental dose-optimization program which includes automated exposure control, adjustment of the mA and/or kV  according to patient size and/or use of iterative reconstruction technique. COMPARISON:  Brain MRI 06/05/2019.  Head CT 05/14/2019. FINDINGS: CT HEAD FINDINGS Brain: Generalized cerebral atrophy. Redemonstrated chronic cortically-based infarcts within the bilateral frontal lobes and left parietal lobe. 16 x 5 mm age-indeterminate infarct centered within the left corona radiata and caudate nucleus, new from the prior brain MRI of 06/05/2019 but otherwise age indeterminate. Background mild patchy and ill-defined hypoattenuation within the cerebral white matter, nonspecific but compatible with chronic small ischemic disease. There is no acute intracranial hemorrhage. No extra-axial fluid collection. No evidence of an intracranial mass. No midline shift. Vascular: No hyperdense vessel.  Atherosclerotic calcifications. Skull: No fracture  or aggressive osseous lesion. Sinuses/Orbits: No mass or acute finding within the imaged orbits. No significant paranasal sinus disease. CT CERVICAL SPINE FINDINGS Alignment: No significant spondylolisthesis. Skull base and vertebrae: The basion-dental and atlanto-dental intervals are maintained.No evidence of acute fracture to the cervical spine. Soft tissues and spinal canal: No prevertebral fluid or swelling. No visible canal hematoma. Disc levels: Prior C5-C6 ACDF. Solid arthrodesis across the C5-C6 disc space. No evidence of acute hardware compromise. Background cervical spondylosis with multilevel disc space narrowing and disc bulges/central disc protrusions. Disc space narrowing is greatest at C4-C5 (moderately advanced at this level). No appreciable high-grade spinal canal stenosis. No significant bony neural foraminal narrowing. Upper chest: No consolidation within the imaged lung apices. No visible pneumothorax. IMPRESSION: CT head: 1. 16 x 5 mm infarct within the left corona radiata/caudate nucleus, new from the prior brain MRI of 06/05/2019 but otherwise age indeterminate.  Consider a brain MRI for further evaluation. 2. No acute posttraumatic intracranial findings. 3. Redemonstrated chronic cortically-based infarcts within bilateral frontal and left parietal lobes. 4. Background mild cerebral white matter chronic small vessel ischemic disease. 5. Generalized cerebral atrophy. CT cervical spine: 1. No evidence of acute fracture to the cervical spine. 2. Prior C5-C6 ACDF. 3. Cervical spondylosis as described. Electronically Signed   By: Jackey Loge D.O.   On: 08/08/2022 14:05    Procedures Procedures    Medications Ordered in ED Medications - No data to display  ED Course/ Medical Decision Making/ A&P Clinical Course as of 08/09/22 0850  Mon Aug 08, 2022  1815 Case discussed with Dr. Derry Lory and Dr. Selina Cooley.  Of neurology for concerning CT head.  They agree with MRI assessment and will consult if MRI is positive. [AH]  2023 Urinalysis, Routine w reflex microscopic -Urine, Clean Catch(!) [AH]  2023 Creatinine(!): 2.29 [AH]  2023 CK Total(!): 1,265 I have reviewed the patient's [AH]    Clinical Course User Index [AH] Arthor Captain, PA-C                             Medical Decision Making Amount and/or Complexity of Data Reviewed Labs: ordered. Decision-making details documented in ED Course. Radiology: ordered.  Risk Decision regarding hospitalization.   This patient presents to the ED for concern of weakness and falls, this involves an extensive number of treatment options, and is a complaint that carries with it a high risk of complications and morbidity.  The differential diagnosis of weakness includes but is not limited to neurologic causes (GBS, myasthenia gravis, CVA, MS, ALS, transverse myelitis, spinal cord injury, CVA, botulism, ) and other causes: ACS, Arrhythmia, syncope, orthostatic hypotension, sepsis, hypoglycemia, electrolyte disturbance, hypothyroidism, respiratory failure, symptomatic anemia, dehydration, heat injury, polypharmacy,  malignancy.   Co morbidities:  has a past medical history of Breast cancer (HCC) (12/2019), CKD (chronic kidney disease) (06/07/2016), Diabetic polyneuropathy (06/07/2016), Dyslipidemia, Endometrial adenocarcinoma (2012), Fever blister, Hyperlipidemia, Hypertension, essential (06/07/2016), Major depressive disorder, Mixed dyslipidemia (06/07/2016), MRSA infection (10/2019), Obstructive sleep apnea (06/07/2016), Peripheral neuropathy, Retinopathy, Severe obesity, Skin ulcer of left foot with fat layer exposed (11/02/2016), Stroke, Tachycardia (07/19/2017), Type 2 diabetes mellitus with hyperglycemia, with long-term current use of insulin (06/07/2016), and Unilateral primary osteoarthritis, right knee (04/16/2019).   Social Determinants of Health:   SDOH Screenings   Tobacco Use: Low Risk  (08/01/2022)  Lives alone   Additional history:  Additional history obtained from sister Review of EMR  Lab Tests:  I Ordered,  and personally interpreted labs.  The pertinent results include:    UA- appears infected WBC- 14.8 Glu 172 Cr 2.29>>1.42 Ck 1.25   Imaging Studies:  I ordered imaging studies including xray ribs, CT head I independently visualized and interpreted imaging which showed ant  rib frx 8-9, indeterminate age stroke on CT head I agree with the radiologist interpretation  Cardiac Monitoring/ECG:  The patient was maintained on a cardiac monitor.  I personally viewed and interpreted the cardiac monitored which showed an underlying rhythm of: Sinus rhythm at a rate of 71  Medicines ordered and prescription drug management:  I ordered medication including  sodium chloride 0.9 % bolus 1,000 mL (1,000 mLs Intravenous New Bag/Given 08/08/22 2128) for dehydration Reevaluation of the patient after these medicines showed that the patient improved I have reviewed the patients home medicines and have made adjustments as needed  Test Considered:  MRI- pending  Critical  Interventions:  fluids  Consultations Obtained: [facility] Dr. Margo Aye for admission  Problem List / ED Course:     ICD-10-CM   1. Weakness  R53.1     2. Peripheral edema  R60.0     3. Closed fracture of multiple ribs of right side, initial encounter  S22.41XA     4. Acute cystitis without hematuria  N30.00     5. Multiple falls  R29.6     6. AKI (acute kidney injury) (HCC)  N17.9       MDM: patient with suspected storke on CT. Moderately ill. Will need MRI and medical mgmt   Dispostion:  After consideration of the diagnostic results and the patients response to treatment, I feel that the patent would benefit from admission.       Final Clinical Impression(s) / ED Diagnoses Final diagnoses:  None    Rx / DC Orders ED Discharge Orders     None         Arthor Captain, PA-C 08/09/22 1004    Wynetta Fines, MD 08/16/22 0830

## 2022-08-08 NOTE — ED Notes (Signed)
Pt transported to XR.  

## 2022-08-08 NOTE — Progress Notes (Signed)
Received patient from ED, Alert and oriented, in NAD, vss, oriented to room and environment, safety prec explained and initiated, bed alarm on, call light/phone within reach.

## 2022-08-08 NOTE — ED Notes (Signed)
Pt placed on bedpan. Once finished pt cleaned, new chuck placed.

## 2022-08-08 NOTE — ED Notes (Signed)
Pt arrived to ED covered in feces and urine. Pt taken to shower room and cleaned.

## 2022-08-09 ENCOUNTER — Inpatient Hospital Stay (HOSPITAL_COMMUNITY): Payer: PPO

## 2022-08-09 DIAGNOSIS — R600 Localized edema: Secondary | ICD-10-CM | POA: Diagnosis not present

## 2022-08-09 DIAGNOSIS — T796XXD Traumatic ischemia of muscle, subsequent encounter: Secondary | ICD-10-CM | POA: Diagnosis not present

## 2022-08-09 DIAGNOSIS — F329 Major depressive disorder, single episode, unspecified: Secondary | ICD-10-CM | POA: Diagnosis not present

## 2022-08-09 DIAGNOSIS — N390 Urinary tract infection, site not specified: Secondary | ICD-10-CM | POA: Insufficient documentation

## 2022-08-09 DIAGNOSIS — R531 Weakness: Secondary | ICD-10-CM | POA: Diagnosis not present

## 2022-08-09 DIAGNOSIS — I6389 Other cerebral infarction: Secondary | ICD-10-CM

## 2022-08-09 DIAGNOSIS — S2231XA Fracture of one rib, right side, initial encounter for closed fracture: Secondary | ICD-10-CM

## 2022-08-09 DIAGNOSIS — L8990 Pressure ulcer of unspecified site, unspecified stage: Secondary | ICD-10-CM | POA: Insufficient documentation

## 2022-08-09 DIAGNOSIS — I639 Cerebral infarction, unspecified: Secondary | ICD-10-CM | POA: Diagnosis not present

## 2022-08-09 DIAGNOSIS — N1831 Chronic kidney disease, stage 3a: Secondary | ICD-10-CM | POA: Insufficient documentation

## 2022-08-09 DIAGNOSIS — E1142 Type 2 diabetes mellitus with diabetic polyneuropathy: Secondary | ICD-10-CM

## 2022-08-09 DIAGNOSIS — T796XXA Traumatic ischemia of muscle, initial encounter: Secondary | ICD-10-CM

## 2022-08-09 DIAGNOSIS — N179 Acute kidney failure, unspecified: Secondary | ICD-10-CM

## 2022-08-09 DIAGNOSIS — Z794 Long term (current) use of insulin: Secondary | ICD-10-CM

## 2022-08-09 HISTORY — DX: Fracture of one rib, right side, initial encounter for closed fracture: S22.31XA

## 2022-08-09 HISTORY — DX: Traumatic ischemia of muscle, initial encounter: T79.6XXA

## 2022-08-09 HISTORY — DX: Urinary tract infection, site not specified: N39.0

## 2022-08-09 HISTORY — DX: Acute kidney failure, unspecified: N17.9

## 2022-08-09 LAB — PHOSPHORUS: Phosphorus: 3.9 mg/dL (ref 2.5–4.6)

## 2022-08-09 LAB — CBC
HCT: 26.5 % — ABNORMAL LOW (ref 36.0–46.0)
Hemoglobin: 9.1 g/dL — ABNORMAL LOW (ref 12.0–15.0)
MCH: 32.6 pg (ref 26.0–34.0)
MCHC: 34.3 g/dL (ref 30.0–36.0)
MCV: 95 fL (ref 80.0–100.0)
Platelets: 204 10*3/uL (ref 150–400)
RBC: 2.79 MIL/uL — ABNORMAL LOW (ref 3.87–5.11)
RDW: 13.5 % (ref 11.5–15.5)
WBC: 9.5 10*3/uL (ref 4.0–10.5)
nRBC: 0 % (ref 0.0–0.2)

## 2022-08-09 LAB — COMPREHENSIVE METABOLIC PANEL
ALT: 23 U/L (ref 0–44)
AST: 43 U/L — ABNORMAL HIGH (ref 15–41)
Albumin: 2.6 g/dL — ABNORMAL LOW (ref 3.5–5.0)
Alkaline Phosphatase: 34 U/L — ABNORMAL LOW (ref 38–126)
Anion gap: 8 (ref 5–15)
BUN: 40 mg/dL — ABNORMAL HIGH (ref 8–23)
CO2: 22 mmol/L (ref 22–32)
Calcium: 8.2 mg/dL — ABNORMAL LOW (ref 8.9–10.3)
Chloride: 101 mmol/L (ref 98–111)
Creatinine, Ser: 1.85 mg/dL — ABNORMAL HIGH (ref 0.44–1.00)
GFR, Estimated: 29 mL/min — ABNORMAL LOW (ref >60)
Glucose, Bld: 146 mg/dL — ABNORMAL HIGH (ref 70–99)
Potassium: 3.5 mmol/L (ref 3.5–5.1)
Sodium: 131 mmol/L — ABNORMAL LOW (ref 135–145)
Total Bilirubin: 1.4 mg/dL — ABNORMAL HIGH (ref 0.3–1.2)
Total Protein: 5.8 g/dL — ABNORMAL LOW (ref 6.5–8.1)

## 2022-08-09 LAB — GLUCOSE, CAPILLARY
Glucose-Capillary: 132 mg/dL — ABNORMAL HIGH (ref 70–99)
Glucose-Capillary: 150 mg/dL — ABNORMAL HIGH (ref 70–99)
Glucose-Capillary: 171 mg/dL — ABNORMAL HIGH (ref 70–99)
Glucose-Capillary: 193 mg/dL — ABNORMAL HIGH (ref 70–99)
Glucose-Capillary: 160 mg/dL — ABNORMAL HIGH (ref 70–99)
Glucose-Capillary: 261 mg/dL — ABNORMAL HIGH (ref 70–99)

## 2022-08-09 LAB — ECHOCARDIOGRAM COMPLETE BUBBLE STUDY
Area-P 1/2: 2.56 cm2
Calc EF: 59.3 %
S' Lateral: 3.1 cm
Single Plane A2C EF: 57.2 %
Single Plane A4C EF: 61.9 %

## 2022-08-09 LAB — HEMOGLOBIN A1C
Hgb A1c MFr Bld: 7 % — ABNORMAL HIGH (ref 4.8–5.6)
Mean Plasma Glucose: 154.2 mg/dL

## 2022-08-09 LAB — LIPID PANEL
Cholesterol: 121 mg/dL (ref 0–200)
HDL: 34 mg/dL — ABNORMAL LOW (ref >40)
LDL Cholesterol: 58 mg/dL (ref 0–99)
Total CHOL/HDL Ratio: 3.6 RATIO
Triglycerides: 144 mg/dL (ref <150)
VLDL: 29 mg/dL (ref 0–40)

## 2022-08-09 LAB — MAGNESIUM: Magnesium: 1.8 mg/dL (ref 1.7–2.4)

## 2022-08-09 MED ORDER — CLOPIDOGREL BISULFATE 75 MG PO TABS
75.0000 mg | ORAL_TABLET | Freq: Every day | ORAL | Status: DC
  Administered 2022-08-09 – 2022-08-12 (×4): 75 mg via ORAL
  Filled 2022-08-09 (×4): qty 1

## 2022-08-09 MED ORDER — PROCHLORPERAZINE EDISYLATE 10 MG/2ML IJ SOLN: 5.0000 mg | Freq: Four times a day (QID) | INTRAMUSCULAR | Status: DC | PRN

## 2022-08-09 MED ORDER — POLYETHYLENE GLYCOL 3350 17 G PO PACK: 17.0000 g | PACK | Freq: Every day | ORAL | Status: DC | PRN

## 2022-08-09 MED ORDER — ACETAMINOPHEN 325 MG PO TABS
650.0000 mg | ORAL_TABLET | Freq: Four times a day (QID) | ORAL | Status: DC | PRN
  Administered 2022-08-09: 650 mg via ORAL
  Filled 2022-08-09: qty 2

## 2022-08-09 MED ORDER — HYDROMORPHONE HCL 1 MG/ML IJ SOLN: 0.5000 mg | INTRAMUSCULAR | Status: DC | PRN

## 2022-08-09 MED ORDER — ASPIRIN 300 MG RE SUPP
300.0000 mg | Freq: Once | RECTAL | Status: AC
  Administered 2022-08-09: 300 mg via RECTAL
  Filled 2022-08-09: qty 1

## 2022-08-09 MED ORDER — SODIUM CHLORIDE 0.9 % IV SOLN: INTRAVENOUS | Status: DC

## 2022-08-09 MED ORDER — MELATONIN 3 MG PO TABS
3.0000 mg | ORAL_TABLET | Freq: Every evening | ORAL | Status: DC | PRN
  Filled 2022-08-09: qty 1

## 2022-08-09 MED ORDER — ASPIRIN 81 MG PO TBEC
81.0000 mg | DELAYED_RELEASE_TABLET | Freq: Every day | ORAL | Status: DC
  Administered 2022-08-09 – 2022-08-12 (×4): 81 mg via ORAL
  Filled 2022-08-09 (×4): qty 1

## 2022-08-09 MED ORDER — INSULIN ASPART 100 UNIT/ML IJ SOLN
0.0000 [IU] | Freq: Three times a day (TID) | INTRAMUSCULAR | Status: DC
  Administered 2022-08-09: 2 [IU] via SUBCUTANEOUS
  Administered 2022-08-10: 5 [IU] via SUBCUTANEOUS
  Administered 2022-08-10: 3 [IU] via SUBCUTANEOUS
  Administered 2022-08-10: 1 [IU] via SUBCUTANEOUS
  Administered 2022-08-11 (×2): 2 [IU] via SUBCUTANEOUS
  Administered 2022-08-12: 3 [IU] via SUBCUTANEOUS
  Administered 2022-08-12: 2 [IU] via SUBCUTANEOUS

## 2022-08-09 MED ORDER — BUPROPION HCL ER (XL) 150 MG PO TB24
300.0000 mg | ORAL_TABLET | Freq: Every day | ORAL | Status: DC
  Administered 2022-08-09 – 2022-08-12 (×4): 300 mg via ORAL
  Filled 2022-08-09 (×4): qty 2

## 2022-08-09 MED ORDER — ESCITALOPRAM OXALATE 10 MG PO TABS
20.0000 mg | ORAL_TABLET | Freq: Every day | ORAL | Status: DC
  Administered 2022-08-09 – 2022-08-12 (×4): 20 mg via ORAL
  Filled 2022-08-09 (×4): qty 2

## 2022-08-09 MED ORDER — CARVEDILOL 6.25 MG PO TABS
6.2500 mg | ORAL_TABLET | Freq: Two times a day (BID) | ORAL | Status: DC
  Administered 2022-08-09 – 2022-08-12 (×7): 6.25 mg via ORAL
  Filled 2022-08-09 (×7): qty 1

## 2022-08-09 MED ORDER — LOSARTAN POTASSIUM 50 MG PO TABS
50.0000 mg | ORAL_TABLET | Freq: Every morning | ORAL | Status: DC
  Administered 2022-08-09 – 2022-08-12 (×4): 50 mg via ORAL
  Filled 2022-08-09 (×4): qty 1

## 2022-08-09 MED ORDER — ATORVASTATIN CALCIUM 80 MG PO TABS
80.0000 mg | ORAL_TABLET | Freq: Every day | ORAL | Status: DC
  Administered 2022-08-09 – 2022-08-12 (×4): 80 mg via ORAL
  Filled 2022-08-09 (×4): qty 1

## 2022-08-09 MED ORDER — OXYCODONE HCL 5 MG PO TABS: 5.0000 mg | ORAL_TABLET | Freq: Four times a day (QID) | ORAL | Status: DC | PRN

## 2022-08-09 NOTE — Consult Note (Signed)
NEUROLOGY CONSULTATION NOTE   Date of service: Aug 09, 2022 Patient Name: [name] MRN:  [id number] DOB:  1950-10-10 Reason for consult: "BL strokes on MRI" Requesting Provider: Darlin Drop, DO _ _ _   _ __   _ __ _ _  __ __   _ __   __ _  History of Present Illness  [name] is a 72 y.o. female with PMH significant for breast cancer, DM2, obesity, OSA who presents with progressive weakness and recurrent falls. Reports 3 falls in the last 3 days. Did not hit her head. Fell on her knees. With her lat fall, she was unable to get up and was on the ground for 10 hours and EMS called.  Reports leg feel heavy and swollen. She is supposed to be taking lasix but has not taken in a couple months.  She had MRI brain w/o c which demonstrates Multifocal bilateral acute ischemia, predominantly affecting the deep gray matter. There also 2 cortical foci within the right MCA territory. No hemorrhage or mass effect.  LKW: 08/06/22 mRS: 3 tNKASE: not offered, outside window Thrombectomy: not offered, outside window NIHSS components Score: Comment  1a Level of Conscious 0[x]  1[]  2[]  3[]      1b LOC Questions 0[x]  1[]  2[]       1c LOC Commands 0[x]  1[]  2[]       2 Best Gaze 0[x]  1[]  2[]       3 Visual 0[x]  1[]  2[]  3[]      4 Facial Palsy 0[x]  1[]  2[]  3[]      5a Motor Arm - left 0[x]  1[]  2[]  3[]  4[]  UN[]    5b Motor Arm - Right 0[x]  1[]  2[]  3[]  4[]  UN[]    6a Motor Leg - Left 0[x]  1[]  2[]  3[]  4[]  UN[]    6b Motor Leg - Right 0[x]  1[]  2[]  3[]  4[]  UN[]    7 Limb Ataxia 0[x]  1[]  2[]  3[]  UN[]     8 Sensory 0[x]  1[]  2[]  UN[]      9 Best Language 0[x]  1[]  2[]  3[]      10 Dysarthria 0[x]  1[]  2[]  UN[]      11 Extinct. and Inattention 0[x]  1[]  2[]       TOTAL: 0      ROS   Constitutional Denies weight loss, fever and chills.   HEENT Denies changes in vision and hearing.   Respiratory Denies SOB and cough.   CV Denies palpitations and CP   GI Denies abdominal pain, nausea, vomiting and diarrhea.    GU Denies dysuria and urinary frequency.   MSK Denies myalgia and joint pain.   Skin Denies rash and pruritus.   Neurological Denies headache and syncope.   Psychiatric Denies recent changes in mood. Denies anxiety and depression.    Past History   Past Medical History:  Diagnosis Date   Breast cancer (HCC) 12/2019   left breast DCIS   CKD (chronic kidney disease) 06/07/2016   Stage 2, GFR 60-89 ml/min   Diabetic polyneuropathy 06/07/2016   Dyslipidemia    Endometrial adenocarcinoma 2012   Fever blister    Hyperlipidemia    Hypertension, essential 06/07/2016   Major depressive disorder    Mixed dyslipidemia 06/07/2016   MRSA infection 10/2019   left foot wound   Obstructive sleep apnea 06/07/2016   does not use CPAP   Peripheral neuropathy    Retinopathy    Severe obesity    Skin ulcer of left foot with fat layer exposed 11/02/2016   Stroke  Asymptomatic, discovered via neuroimaging; small infarct in left parietal lobe; also concern for small b/l frontal infarcts   Tachycardia 07/19/2017   Type 2 diabetes mellitus with hyperglycemia, with long-term current use of insulin 06/07/2016   Unilateral primary osteoarthritis, right knee 04/16/2019   Past Surgical History:  Procedure Laterality Date   ADRENALECTOMY     BREAST LUMPECTOMY WITH RADIOACTIVE SEED LOCALIZATION Left 02/25/2020   Procedure: LEFT BREAST LUMPECTOMY WITH RADIOACTIVE SEED LOCALIZATION;  Surgeon: Abigail Miyamoto, MD;  Location: [facility];  Service: General;  Laterality: Left;   BUNIONECTOMY     CARDIAC CATHETERIZATION     CERVICAL SPINE SURGERY     CHOLECYSTECTOMY     DILATION AND CURETTAGE OF UTERUS     LAPAROSCOPIC SALPINGOOPHERECTOMY     THORACOTOMY     TONSILLECTOMY     Family History  Problem Relation Age of Onset   Hypertension Mother    Alzheimer's disease Mother    Heart attack Father    CAD Father    Alzheimer's disease Father    Bladder Cancer Father        dx late 28s    Diverticulitis Sister    Obesity Sister    Hypertension Sister    Voice disorder Brother    Breast cancer Paternal Aunt 18   Prostate cancer Paternal Uncle        dx mid 46s   Social History   Socioeconomic History   Marital status: Single    Spouse name: Not on file   Number of children: Not on file   Years of education: 16   Highest education level: Bachelor's degree (e.g., BA, AB, BS)  Occupational History   Occupation: Retired    Comment: Runner, broadcasting/film/video  Tobacco Use   Smoking status: Never   Smokeless tobacco: Never  Vaping Use   Vaping Use: Never used  Substance and Sexual Activity   Alcohol use: No   Drug use: No   Sexual activity: Not Currently    Birth control/protection: Surgical  Other Topics Concern   Not on file  Social History Narrative   Right Handed   Lives in one story home   Does not drink caffeine   Social Determinants of Health   Financial Resource Strain: Not on file  Food Insecurity: Not on file  Transportation Needs: Not on file  Physical Activity: Not on file  Stress: Not on file  Social Connections: Not on file   Allergies  Allergen Reactions   Amitriptyline Other (See Comments)   Ciprofibrate Other (See Comments)   Ciprofloxacin Other (See Comments)   Pristiq [Desvenlafaxine] Other (See Comments)   Sulfur     Other Reaction(s): Unknown   Penicillins Rash   Sulfa Antibiotics Rash   Sulfamethoxazole Rash    Medications   Medications Prior to Admission  Medication Sig Dispense Refill Last Dose   buPROPion (WELLBUTRIN XL) 300 MG 24 hr tablet Take 300 mg by mouth daily.      carvedilol (COREG) 6.25 MG tablet Take 6.25 mg by mouth 2 (two) times daily.      doxycycline (VIBRAMYCIN) 100 MG capsule Take 1 capsule (100 mg total) by mouth 2 (two) times daily. 20 capsule 0    escitalopram (LEXAPRO) 10 MG tablet Take 10 mg by mouth daily.      escitalopram (LEXAPRO) 20 MG tablet Take 20 mg by mouth daily.      FLUoxetine (PROZAC) 40 MG capsule  Take 40 mg by mouth daily.  0    furosemide (LASIX) 40 MG tablet Take 40 mg by mouth daily as needed.      insulin NPH-regular Human (NOVOLIN 70/30) (70-30) 100 UNIT/ML injection Inject into the skin. 24 ml in the morning and 20 ml in the evening      losartan (COZAAR) 50 MG tablet Take 50 mg by mouth every morning.      metFORMIN (GLUCOPHAGE-XR) 500 MG 24 hr tablet Take 1,000 mg by mouth in the morning and at bedtime. PT TAKES 2000 MG DAILY      MONOJECT INSULIN SYRINGE 31G X 5/16" 1 ML MISC See admin instructions.      mupirocin ointment (BACTROBAN) 2 % Apply 1 application topically daily.      nutrition supplement, JUVEN, (JUVEN) PACK Take 1 packet by mouth 2 (two) times daily between meals. 30 packet 2    nystatin cream (MYCOSTATIN) 1 APPLICATION TWICE A DAY EXTERNALLY      ONETOUCH VERIO test strip       pravastatin (PRAVACHOL) 40 MG tablet Take 1 tablet once a day for cholesterol      tamoxifen (NOLVADEX) 20 MG tablet TAKE ONE TABLET BY MOUTH EVERY MORNING 90 tablet 0      Vitals   Vitals:   08/08/22 1909 08/08/22 2129 08/08/22 2200 08/09/22 0114  BP: (!) 198/72 (!) 141/100 119/71 (!) 171/52  Pulse: 75 75 71 72  Resp: 17 19 16 16   Temp: 97.8 F (36.6 C) 98.2 F (36.8 C)  98.5 F (36.9 C)  TempSrc: Oral Oral Oral Oral  SpO2: 100% 100% 100% 100%     There is no height or weight on file to calculate BMI.  Physical Exam   General: Laying comfortably in bed; in no acute distress.  HENT: Normal oropharynx and mucosa. Normal external appearance of ears and nose.  Neck: Supple, no pain or tenderness  CV: No JVD. No peripheral edema.  Pulmonary: Symmetric Chest rise. Normal respiratory effort.  Abdomen: Soft to touch, non-tender.  Ext: No cyanosis, edema, or deformity  Skin: No rash. Normal palpation of skin.   Musculoskeletal: Normal digits and nails by inspection. No clubbing.   Neurologic Examination  Mental status/Cognition: Alert, oriented to self, place, month and  year, good attention.  Speech/language: Fluent, comprehension intact, object naming intact, repetition intact.  Cranial nerves:   CN II Pupils equal and reactive to light, no VF deficits    CN III,IV,VI EOM intact, no gaze preference or deviation, no nystagmus    CN V normal sensation in V1, V2, and V3 segments bilaterally    CN VII no asymmetry, no nasolabial fold flattening    CN VIII normal hearing to speech    CN IX & X normal palatal elevation, no uvular deviation    CN XI 5/5 head turn and 5/5 shoulder shrug bilaterally    CN XII midline tongue protrusion    Motor:  Muscle bulk: poor, tone normal, pronator drift none tremor none Mvmt Root Nerve  Muscle Right Left Comments  SA C5/6 Ax Deltoid 5 5   EF C5/6 Mc Biceps 5 5   EE C6/7/8 Rad Triceps 5 5   WF C6/7 Med FCR     WE C7/8 PIN ECU     F Ab C8/T1 U ADM/FDI 5 5   HF L1/2/3 Fem Illopsoas 2 2 Limited by pain and swelling  KE L2/3/4 Fem Quad 2 2   DF L4/5 D Peron Tib Ant 5 5   PF  S1/2 Tibial Grc/Sol 5 5    Sensation:  Light touch Intact throughout   Pin prick    Temperature    Vibration   Proprioception    Coordination/Complex Motor:  - Finger to Nose intact BL - Heel to shin unable to do - Rapid alternating movement are slowed throughout - Gait: deferred.  Labs   CBC:  Recent Labs  Lab 08/08/22 1252 08/09/22 0328  WBC 14.8* 9.5  NEUTROABS 11.6*  --   HGB 12.1 9.1*  HCT 36.7 26.5*  MCV 100.3* 95.0  PLT 323 204    Basic Metabolic Panel:  Lab Results  Component Value Date   NA 131 (L) 08/09/2022   K 3.5 08/09/2022   CO2 22 08/09/2022   GLUCOSE 146 (H) 08/09/2022   BUN 40 (H) 08/09/2022   CREATININE 1.85 (H) 08/09/2022   CALCIUM 8.2 (L) 08/09/2022   GFRNONAA 29 (L) 08/09/2022   GFRAA 81 01/03/2017   Lipid Panel:  Lab Results  Component Value Date   LDLCALC 58 08/09/2022   HgbA1c:  Lab Results  Component Value Date   HGBA1C 7.0 (H) 08/09/2022   Urine Drug Screen: No results found for:  "LABOPIA", "COCAINSCRNUR", "LABBENZ", "AMPHETMU", "THCU", "LABBARB"  Alcohol Level No results found for: "ETH"  CT Head without contrast(Personally reviewed): CTH was negative for a large hypodensity concerning for a large territory infarct or hyperdensity concerning for an ICH  MR Angio head without contrast and Carotid Duplex BL(Personally reviewed): No LVO, US carotid duplex pending.  MRI Brain(Personally reviewed): 1. Multifocal bilateral acute ischemia, predominantly affecting the deep gray matter. There also 2 cortical foci within the right MCA territory. No hemorrhage or mass effect. 2. Old bilateral MCA territory infarcts. 3. Mild volume loss and chronic ischemic microangiopathy.  Impression   [name] is a 72 y.o. female with PMH significant for breast cancer, DM2, obesity, OSA who presents with progressive weakness and recurrent falls. Found to have significant BL lower ext edema and multifocal bilateral acute ischemia, predominantly affecting the deep gray matter. There also 2 cortical foci within the right MCA territory. No hemorrhage or mass effect.  Suspect that her falls are likely due to combination of BL lower ext edema and noted strokes  Recommendations  - Frequent Neuro checks per stroke unit protocol - Recommend Vascular imaging with MRA Angio Head without contrast and US Carotid doppler - Recommend obtaining TTE  - Recommend obtaining Lipid panel with LDL - Please start statin if LDL > 70 - Recommend HbA1c to evaluate for diabetes and how well it is controlled. - Antithrombotic - aspirin 81mg  daily along with plavix 75mg  daily x 21 days, followed by Aspirin 81mg  daily alone - Recommend DVT ppx - SBP goal - aim for gradual normotension. - Recommend Telemetry monitoring for arrythmia - Recommend bedside swallow screen prior to PO intake. - Stroke education booklet - Recommend PT/OT/SLP  consult   ______________________________________________________________________   Thank you for the opportunity to take part in the care of this patient. If you have any further questions, please contact the neurology consultation attending.  Signed,  Erick Blinks [facility] Pager Number [phone] _ _ _   _ __   _ __ _ _  __ __   _ __   __ _

## 2022-08-09 NOTE — Progress Notes (Addendum)
STROKE TEAM PROGRESS NOTE   INTERVAL HISTORY No family at bedside. Presented 5/21 with progressive weakness and recurrent falls, cites 3 falls in 3 days.  Found to have significant bilateral lower extremity edema and new cortical foci within the right MCA territory along with old strokes on MRI. CTA negative.  No anticoagulation at home.  Now on aspirin and Plavix for 3 weeks then aspirin. Patient reports being independent at home.  She is a retired Runner, broadcasting/film/video.  She does speak of grief and difficulty recovering from the death of both of her parents.   Vitals:   08/09/22 0610 08/09/22 0630 08/09/22 0734 08/09/22 1503  BP:   (!) 182/55 (!) 157/47  Pulse:   77 69  Resp:      Temp:   98.5 F (36.9 C) 97.9 F (36.6 C)  TempSrc:   Oral Oral  SpO2:   99% 100%  Weight: 106.6 kg     Height:  5' 9.5" (1.765 m)     CBC:  Recent Labs  Lab 08/08/22 1252 08/09/22 0328  WBC 14.8* 9.5  NEUTROABS 11.6*  --   HGB 12.1 9.1*  HCT 36.7 26.5*  MCV 100.3* 95.0  PLT 323 204   Basic Metabolic Panel:  Recent Labs  Lab 08/08/22 1252 08/09/22 0328  NA 134* 131*  K 3.8 3.5  CL 99 101  CO2 21* 22  GLUCOSE 172* 146*  BUN 45* 40*  CREATININE 2.29* 1.85*  CALCIUM 9.2 8.2*  MG  --  1.8  PHOS  --  3.9   Lipid Panel:  Recent Labs  Lab 08/09/22 0328  CHOL 121  TRIG 144  HDL 34*  CHOLHDL 3.6  VLDL 29  LDLCALC 58   HgbA1c:  Recent Labs  Lab 08/09/22 0328  HGBA1C 7.0*   Urine Drug Screen: No results for input(s): "LABOPIA", "COCAINSCRNUR", "LABBENZ", "AMPHETMU", "THCU", "LABBARB" in the last 168 hours.  Alcohol Level No results for input(s): "ETH" in the last 168 hours.  IMAGING past 24 hours VAS US CAROTID  Result Date: 08/09/2022 Carotid Arterial Duplex Study Patient Name:  [facility]  Date of Exam:   08/09/2022 Medical Rec #: [id number]        Accession #:    [id number] Date of Birth: Apr 26, 1950        Patient Gender: F Patient Age:   72 years Exam Location:  [facility]  Procedure:      VAS US CAROTID Referring Phys: Dow Adolph --------------------------------------------------------------------------------  Indications:       CVA and Weakness. Risk Factors:      Hypertension, hyperlipidemia, Diabetes. Comparison Study:  No previous study. Performing Technologist: McKayla Maag RVT, [place]  Examination Guidelines: A complete evaluation includes B-mode imaging, spectral Doppler, color Doppler, and power Doppler as needed of all accessible portions of each vessel. Bilateral testing is considered an integral part of a complete examination. Limited examinations for reoccurring indications may be performed as noted.  Right Carotid Findings: +----------+--------+--------+--------+---------------------+------------------+           PSV cm/sEDV cm/sStenosisPlaque Description   Comments           +----------+--------+--------+--------+---------------------+------------------+ CCA Prox  78      9                                                       +----------+--------+--------+--------+---------------------+------------------+  CCA Distal64      7                                                       +----------+--------+--------+--------+---------------------+------------------+ ICA Prox  106     17      1-39%   homogeneous,         intimal thickening                                   irregular and                                                             calcific                                +----------+--------+--------+--------+---------------------+------------------+ ICA Mid   97      19                                                      +----------+--------+--------+--------+---------------------+------------------+ ICA Distal127     21                                                      +----------+--------+--------+--------+---------------------+------------------+ ECA       193     19                                                       +----------+--------+--------+--------+---------------------+------------------+ +----------+--------+-------+--------+-------------------+           PSV cm/sEDV cmsDescribeArm Pressure (mmHG) +----------+--------+-------+--------+-------------------+ [id number]            Stenotic                    +----------+--------+-------+--------+-------------------+ +---------+--------+--+--------+-+---------+ VertebralPSV cm/s32EDV cm/s7Antegrade +---------+--------+--+--------+-+---------+  Left Carotid Findings: +----------+--------+--------+--------+---------------------+------------------+           PSV cm/sEDV cm/sStenosisPlaque Description   Comments           +----------+--------+--------+--------+---------------------+------------------+ CCA Prox  107     9                                                       +----------+--------+--------+--------+---------------------+------------------+ CCA Distal69      13  intimal thickening +----------+--------+--------+--------+---------------------+------------------+ ICA Prox  129     18      40-59%  homogeneous,                                                              irregular and                                                             calcific                                +----------+--------+--------+--------+---------------------+------------------+ ICA Mid   120     13                                                      +----------+--------+--------+--------+---------------------+------------------+ ICA Distal114     22                                                      +----------+--------+--------+--------+---------------------+------------------+ ECA       107                                                             +----------+--------+--------+--------+---------------------+------------------+  +----------+--------+--------+----------------+-------------------+           PSV cm/sEDV cm/sDescribe        Arm Pressure (mmHG) +----------+--------+--------+----------------+-------------------+ [id number]             Multiphasic, WNL                    +----------+--------+--------+----------------+-------------------+ +---------+--------+--+--------+-+---------+ VertebralPSV cm/s49EDV cm/s9Antegrade +---------+--------+--+--------+-+---------+   Summary: Right Carotid: Velocities in the right ICA are consistent with a 1-39% stenosis. Left Carotid: Velocities in the left ICA are consistent with a 40-59% stenosis. Vertebrals:  Bilateral vertebral arteries demonstrate antegrade flow. Subclavians: Right subclavian artery was stenotic. Normal flow hemodynamics were              seen in the left subclavian artery. *See table(s) above for measurements and observations.     Preliminary    ECHOCARDIOGRAM COMPLETE BUBBLE STUDY  Result Date: 08/09/2022    ECHOCARDIOGRAM REPORT   Patient Name:   [facility] Ole[facility] Date of Exam: 08/09/2022 Medical Rec #:  [id number]       Height:       69.5 in Accession #:    [id number]      Weight:       235.0 lb Date of Birth:  10/25/1950       BSA:  2.224 m Patient Age:    72 years        BP:           182/55 mmHg Patient Gender: F               HR:           80 bpm. Exam Location:  Inpatient Procedure: 2D Echo, Cardiac Doppler, Color Doppler and Saline Contrast Bubble            Study Indications:    stroke  History:        Patient has no prior history of Echocardiogram examinations.                 Stroke; Risk Factors:Hypertension, Diabetes and Dyslipidemia.  Sonographer:    Mike Gip Referring Phys: [id number] CAROLE N HALL IMPRESSIONS  1. Left ventricular ejection fraction, by estimation, is 60 to 65%. The left ventricle has normal function. The left ventricle has no regional wall motion abnormalities. There is mild concentric left ventricular  hypertrophy. Left ventricular diastolic parameters are consistent with Grade I diastolic dysfunction (impaired relaxation).  2. Right ventricular systolic function is normal. The right ventricular size is normal. Tricuspid regurgitation signal is inadequate for assessing PA pressure.  3. Left atrial size was mild to moderately dilated.  4. The mitral valve is normal in structure. No evidence of mitral valve regurgitation. No evidence of mitral stenosis.  5. The aortic valve is tricuspid. There is mild thickening of the aortic valve. Aortic valve regurgitation is not visualized. Aortic valve sclerosis is present, with no evidence of aortic valve stenosis.  6. The inferior vena cava is normal in size with greater than 50% respiratory variability, suggesting right atrial pressure of 3 mmHg.  7. Agitated saline contrast bubble study was negative, with no evidence of any interatrial shunt. FINDINGS  Left Ventricle: Left ventricular ejection fraction, by estimation, is 60 to 65%. The left ventricle has normal function. The left ventricle has no regional wall motion abnormalities. The left ventricular internal cavity size was normal in size. There is  mild concentric left ventricular hypertrophy. Left ventricular diastolic parameters are consistent with Grade I diastolic dysfunction (impaired relaxation). Indeterminate filling pressures. Right Ventricle: The right ventricular size is normal. No increase in right ventricular wall thickness. Right ventricular systolic function is normal. Tricuspid regurgitation signal is inadequate for assessing PA pressure. The tricuspid regurgitant velocity is 2.58 m/s, and with an assumed right atrial pressure of 3 mmHg, the estimated right ventricular systolic pressure is 29.6 mmHg. Left Atrium: Left atrial size was mild to moderately dilated. Right Atrium: Right atrial size was normal in size. Pericardium: There is no evidence of pericardial effusion. Mitral Valve: The mitral valve is  normal in structure. Mild mitral annular calcification. No evidence of mitral valve regurgitation. No evidence of mitral valve stenosis. Tricuspid Valve: The tricuspid valve is normal in structure. Tricuspid valve regurgitation is trivial. No evidence of tricuspid stenosis. Aortic Valve: The aortic valve is tricuspid. There is mild thickening of the aortic valve. Aortic valve regurgitation is not visualized. Aortic valve sclerosis is present, with no evidence of aortic valve stenosis. Pulmonic Valve: The pulmonic valve was grossly normal. Pulmonic valve regurgitation is not visualized. No evidence of pulmonic stenosis. Aorta: The aortic root and ascending aorta are structurally normal, with no evidence of dilitation. Venous: The inferior vena cava is normal in size with greater than 50% respiratory variability, suggesting right atrial pressure of 3 mmHg. IAS/Shunts: No atrial  level shunt detected by color flow Doppler. Agitated saline contrast was given intravenously to evaluate for intracardiac shunting. Agitated saline contrast bubble study was negative, with no evidence of any interatrial shunt.  LEFT VENTRICLE PLAX 2D LVIDd:         4.40 cm      Diastology LVIDs:         3.10 cm      LV e' medial:    6.20 cm/s LV PW:         1.30 cm      LV E/e' medial:  12.5 LV IVS:        1.30 cm      LV e' lateral:   7.72 cm/s LVOT diam:     2.10 cm      LV E/e' lateral: 10.1 LV SV:         99 LV SV Index:   44 LVOT Area:     3.46 cm  LV Volumes (MOD) LV vol d, MOD A2C: 120.0 ml LV vol d, MOD A4C: 126.0 ml LV vol s, MOD A2C: 51.4 ml LV vol s, MOD A4C: 48.0 ml LV SV MOD A2C:     68.6 ml LV SV MOD A4C:     126.0 ml LV SV MOD BP:      73.9 ml RIGHT VENTRICLE             IVC RV Basal diam:  3.70 cm     IVC diam: 2.00 cm RV S prime:     22.80 cm/s TAPSE (M-mode): 3.1 cm LEFT ATRIUM             Index        RIGHT ATRIUM           Index LA diam:        4.40 cm 1.98 cm/m   RA Area:     17.30 cm LA Vol (A2C):   75.7 ml 34.04 ml/m   RA Volume:   43.90 ml  19.74 ml/m LA Vol (A4C):   68.0 ml 30.58 ml/m LA Biplane Vol: 74.0 ml 33.28 ml/m  AORTIC VALVE LVOT Vmax:   112.00 cm/s LVOT Vmean:  78.900 cm/s LVOT VTI:    0.285 m  AORTA Ao Root diam: 3.20 cm Ao Asc diam:  3.50 cm MITRAL VALVE                TRICUSPID VALVE MV Area (PHT): 2.56 cm     TR Peak grad:   26.6 mmHg MV E velocity: 77.80 cm/s   TR Vmax:        258.00 cm/s MV A velocity: 117.00 cm/s MV E/A ratio:  0.66         SHUNTS                             Systemic VTI:  0.29 m                             Systemic Diam: 2.10 cm Rachelle Hora Croitoru MD Electronically signed by Thurmon Fair MD Signature Date/Time: 08/09/2022/1:54:07 PM    Final    MR ANGIO HEAD WO CONTRAST  Result Date: 08/09/2022 CLINICAL DATA:  Stroke follow-up EXAM: MRA HEAD WITHOUT CONTRAST TECHNIQUE: Angiographic images of the Circle of Willis were acquired using MRA technique without intravenous contrast. COMPARISON:  None Available. FINDINGS: POSTERIOR CIRCULATION: --Vertebral arteries: Normal --  Inferior cerebellar arteries: Normal. --Basilar artery: Normal. --Superior cerebellar arteries: Normal. --Posterior cerebral arteries: Normal. ANTERIOR CIRCULATION: --Intracranial internal carotid arteries: Normal. --Anterior cerebral arteries (ACA): Normal. --Middle cerebral arteries (MCA): Normal. Anatomic variants: None Other: None. IMPRESSION: Normal intracranial MRA. Electronically Signed   By: Deatra Robinson M.D.   On: 08/09/2022 01:06   MR BRAIN WO CONTRAST  Result Date: 08/08/2022 CLINICAL DATA:  Altered mental status EXAM: MRI HEAD WITHOUT CONTRAST TECHNIQUE: Multiplanar, multiecho pulse sequences of the brain and surrounding structures were obtained without intravenous contrast. COMPARISON:  06/05/2019 FINDINGS: Brain: Multifocal bilateral acute ischemia, predominantly affecting the deep gray matter. There also 2 cortical foci within the right MCA territory. There are old bilateral MCA territory infarcts. No acute  or chronic hemorrhage. There is multifocal hyperintense T2-weighted signal within the white matter. Generalized volume loss. The midline structures are normal. Vascular: Major flow voids are preserved. Skull and upper cervical spine: Normal calvarium and skull base. Visualized upper cervical spine and soft tissues are normal. Sinuses/Orbits:No paranasal sinus fluid levels or advanced mucosal thickening. No mastoid or middle ear effusion. Normal orbits. IMPRESSION: 1. Multifocal bilateral acute ischemia, predominantly affecting the deep gray matter. There also 2 cortical foci within the right MCA territory. No hemorrhage or mass effect. 2. Old bilateral MCA territory infarcts. 3. Mild volume loss and chronic ischemic microangiopathy. Electronically Signed   By: Deatra Robinson M.D.   On: 08/08/2022 21:34   DG Ribs Unilateral W/Chest Right  Result Date: 08/08/2022 CLINICAL DATA:  De Hollingshead 3 times in the past week with right lower anterior rib pain EXAM: RIGHT RIBS AND CHEST - 3+ VIEW COMPARISON:  06/04/2010 FINDINGS: Suspected acute fracture of the anterior right eighth rib. There is no evidence of pneumothorax or pleural effusion. Both lungs are clear. Stable cardiomediastinal silhouette. Aortic atherosclerotic calcification. Cervical spine fusion hardware IMPRESSION: Suspected acute fracture of the anterior right eighth rib. Electronically Signed   By: Minerva Fester M.D.   On: 08/08/2022 20:14    PHYSICAL EXAM General: Drowsy, Laying comfortably in bed; in no acute distress. Psych: depressed state  HENT: Normal oropharynx and mucosa. Normal external appearance of ears and nose.  CV: No JVD. No peripheral edema.  Pulmonary: Symmetric Chest rise. Normal respiratory effort.  Abdomen: Soft to touch, non-tender.  Ext: Bilateral lower extremity edema Skin: Pressure injuries to B/L heels    Neurologic Examination  Mental status/Cognition: Alert, oriented to self, place, month and year, good attention.   Speech/language: Fluent, comprehension intact, object naming intact, repetition intact.  Cranial nerves:   CN II Pupils equal and reactive to light, no VF deficits    CN III,IV,VI EOM intact, no gaze preference or deviation, no nystagmus    CN V normal sensation in V1, V2, and V3 segments bilaterally    CN VII no asymmetry, no nasolabial fold flattening    CN VIII normal hearing to speech    CN IX & X normal palatal elevation, no uvular deviation    CN XI 5/5 head turn and 5/5 shoulder shrug bilaterally    CN XII midline tongue protrusion    Motor: BUE 5/5 with no drift. BLE 3/5 with limitied ROM due to profound edema.  Sensation: intact throughout Ccoordination: FN intact B/l. HKS unable to do. Slow RAM B/L. Gait: deferred. Patient states she recently started using a walker at home (was using a cane)  ASSESSMENT/PLAN MELVENE ESKOLA is a 72 y.o. female with PMH significant for breast cancer, DM2, obesity, OSA who  presents with progressive weakness and recurrent falls. Found to have significant BL lower ext edema and multifocal bilateral acute ischemia, predominantly affecting thedeep gray matter. There also 2 cortical foci within the right MCA territory. No hemorrhage or mass effect.  Suspect that her falls are likely due to combination of BL lower ext edema and noted strokes  Right MCA territory infarcts and left subcortical lacunar infarct Etiology: Cryptogenic embolism and Small Vessel Disease secondary to multiple risk factors of hypertension, diabetes, advanced age, obesity. Code Stroke CT head Left corona radiata/caudate nucleus infarct, new from prior brain MRI March 2021 Redemonstrated chronic cortically based infarct within bilateral frontal and left parietal lobes Background mild cerebral white matter chronic small vessel ischemic disease  MRI/MRA: Multifocal bilateral acute ischemia, predominantly affecting the deep gray matter. 2 cortical foci within the right MCA  territory. Old bilateral MCA territory infarcts  Carotid Doppler: Left carotid with 40 to 59% stenosis 2D Echo: EF 60 to 65%, mild concentric LVH, grade 1 diastolic dysfunction, moderately dilated left atria, mild thickening of the aortic valve, negative bubble study. Recommend 30-day heart monitor at discharge to look for paroxysmal A-fib  LDL 58 HgbA1c 7.0 VTE prophylaxis - lovenox    Diet   Diet Carb Modified Fluid consistency: Thin; Room service appropriate? Yes   No antithrombotic prior to admission, now on aspirin 81 mg daily and clopidogrel 75 mg daily for 3 weeks followed by aspirin monotherapy. Therapy recommendations:  AIR Disposition:  pending  UTI Empiric Rocephin Culture pending  Hypertension Home meds: Coreg 6.25 mg, losartan 50 mg--reordered Permissive hypertension (OK if < 220/120) but gradually normalize in 2-3 days Long-term BP goal normotensive  Hyperlipidemia Home meds:  none LDL 58, goal < 70 High intensity statin not indicated as LDL is within goal   Diabetes type II Controlled Home meds: Metformin 500 mg, insulin HgbA1c 7.0, goal < 7.0 CBGs Recent Labs    08/09/22 0905 08/09/22 1119 08/09/22 1619  GLUCAP 171* 193* 160*    SSI  Other Stroke Risk Factors Advanced Age >/= 65  Obesity, Body mass index is 34.21 kg/m., BMI >/= 30 associated with increased stroke risk, recommend weight loss, diet and exercise as appropriate   Other Active Problems AKI on CKD S/p fall with rib fx  Hospital day # 1   Pt seen by Neuro NP/APP and later by MD. Note/plan to be edited by MD as needed.    Lynnae January, DNP, AGACNP-BC [facility] Please use AMION for contact information & EPIC for messaging.  STROKE MD NOTE : I have personally obtained history,examined this patient, reviewed notes, independently viewed imaging studies, participated in medical decision making and plan of care.ROS completed by me personally and pertinent positives fully  documented  I have made any additions or clarifications directly to the above note. Agree with note above.  Patient presented with multiple recent falls with MRI scan showing small cortical right MCA as well as large subcortical left MCA infarct.  Recommend aspirin and Plavix for 3 weeks followed by aspirin alone and aggressive risk factor modification.  She will also need 30-day heart monitor at discharge to look for paroxysmal A-fib.  Mobilize out of bed.  Therapy consults.  Conservative management for her mild left carotid stenosis with surveillance carotid ultrasound 6 to 12 months interval.  Greater than 50% time during this 50-minute visit were spent on counseling and coordination of care about her embolic and lacunar strokes and discussion about evaluation and treatment and  answering questions.  Delia Heady, MD Medical Director [facility] Pager: [phone] 08/09/2022 5:46 PM   To contact Stroke Continuity provider, please refer to [email or web address]. After hours, contact General Neurology

## 2022-08-09 NOTE — Evaluation (Signed)
Physical Therapy Evaluation Patient Details Name: [name] MRN: [id number] DOB: 12-19-1950 Today's Date: 08/09/2022  History of Present Illness  [name] is a 72 y.o. female with medical history significant for ductal carcinoma in situ of left breast, DM2, obesity who presented to [facility] ED from home via EMS due to progressive weakness and recurrent falls.  Reportedly, per her sister, she fell several days ago then again yesterday.  After her fall, the patient sat down on the floor for almost 10 hours, could not get up without assistance.  She fell again this morning and was found facedown on the floor where she remained for about 4 hours, covered in urine and feces, unable to get herself up.  EMS was activated.  Upon arrival she complains of generalized weakness and neuropathy in her legs.  No loss of consciousness reported. MRI brain w/o c which demonstrates Multifocal bilateral acute ischemia, predominantly affecting the deep gray matter. There also 2 cortical foci within the right MCA  territory. No hemorrhage or mass effect.  Patient also has R rib fx.   Clinical Impression  Patient received in bed, she is agreeable to PT/OT assessment. Patient reports right side rib pain and overall weakness. She required max +2 assist for rolling and supine to sit. Good sitting balance once positioned at edge of bed. She is able to stand from slightly elevated bed with min +2 assist and is able to ambulate about 3 feet with RW and min +2 assist. Patient will continue to benefit from skilled PT to improve strength, safety and independence with mobility.         Recommendations for follow up therapy are one component of a multi-disciplinary discharge planning process, led by the attending physician.  Recommendations may be updated based on patient status, additional functional criteria and insurance authorization.  Follow Up Recommendations       Assistance Recommended at Discharge Frequent or  constant Supervision/Assistance  Patient can return home with the following  A lot of help with walking and/or transfers;A lot of help with bathing/dressing/bathroom;Help with stairs or ramp for entrance;Assist for transportation;Assistance with cooking/housework    Equipment Recommendations None recommended by PT  Recommendations for Other Services       Functional Status Assessment Patient has had a recent decline in their functional status and demonstrates the ability to make significant improvements in function in a reasonable and predictable amount of time.     Precautions / Restrictions Precautions Precautions: Fall Restrictions Weight Bearing Restrictions: No      Mobility  Bed Mobility Overal bed mobility: Needs Assistance Bed Mobility: Rolling, Sidelying to Sit Rolling: Max assist, +2 for physical assistance Sidelying to sit: Mod assist, +2 for physical assistance, HOB elevated            Transfers Overall transfer level: Needs assistance Equipment used: Rolling walker (2 wheels) Transfers: Sit to/from Stand Sit to Stand: Min assist, +2 physical assistance, From elevated surface           General transfer comment: increased time to power up to standing    Ambulation/Gait Ambulation/Gait assistance: Min guard, +2 physical assistance, +2 safety/equipment Gait Distance (Feet): 3 Feet Assistive device: Rolling walker (2 wheels) Gait Pattern/deviations: Step-to pattern, Decreased step length - right, Decreased step length - left, Decreased stride length Gait velocity: decr     General Gait Details: patient able to take a few steps from bed to recliner. Limited by fatigue  Stairs  Wheelchair Mobility    Modified Rankin (Stroke Patients Only)       Balance Overall balance assessment: Needs assistance Sitting-balance support: Feet supported Sitting balance-Leahy Scale: Good     Standing balance support: Bilateral upper extremity  supported, During functional activity, Reliant on assistive device for balance Standing balance-Leahy Scale: Fair Standing balance comment: Reliant on AD and external support                             Pertinent Vitals/Pain Pain Assessment Pain Assessment: 0-10 Pain Score: 2  Pain Location: R ribs, 10/10 when coughs Pain Descriptors / Indicators: Sore, Discomfort, Guarding Pain Intervention(s): Monitored during session, Repositioned    Home Living Family/patient expects to be discharged to:: Private residence Living Arrangements: Alone- states her sister, [name] will stay with her if needed.  Available Help at Discharge: Family;Available PRN/intermittently Type of Home: House Home Access: Stairs to enter Entrance Stairs-Rails: Doctor, general practice of Steps: 5   Home Layout: One level Home Equipment: Cane - single Librarian, academic (2 wheels);Shower seat;Hand held shower head      Prior Function Prior Level of Function : Independent/Modified Independent;Driving             Mobility Comments: walked with cane out of the home, no AD inside ADLs Comments: sits to shower     Hand Dominance   Dominant Hand: Right    Extremity/Trunk Assessment   Upper Extremity Assessment Upper Extremity Assessment: Overall WFL for tasks assessed    Lower Extremity Assessment Lower Extremity Assessment: Generalized weakness    Cervical / Trunk Assessment Cervical / Trunk Assessment: Normal  Communication   Communication: No difficulties  Cognition Arousal/Alertness: Awake/alert Behavior During Therapy: Flat affect Overall Cognitive Status: Within Functional Limits for tasks assessed                                          General Comments      Exercises     Assessment/Plan    PT Assessment Patient needs continued PT services  PT Problem List Decreased strength;Decreased activity tolerance;Decreased balance;Decreased  mobility;Cardiopulmonary status limiting activity;Pain;Obesity       PT Treatment Interventions DME instruction;Gait training;Therapeutic exercise;Balance training;Stair training;Functional mobility training;Therapeutic activities;Patient/family education;Neuromuscular re-education    PT Goals (Current goals can be found in the Care Plan section)  Acute Rehab PT Goals Patient Stated Goal: to return home, get more confident with mobility PT Goal Formulation: With patient Time For Goal Achievement: 08/19/22 Potential to Achieve Goals: Fair    Frequency Min 4X/week     Co-evaluation PT/OT/SLP Co-Evaluation/Treatment: Yes Reason for Co-Treatment: To address functional/ADL transfers PT goals addressed during session: Mobility/safety with mobility;Balance         AM-PAC PT "6 Clicks" Mobility  Outcome Measure Help needed turning from your back to your side while in a flat bed without using bedrails?: A Lot Help needed moving from lying on your back to sitting on the side of a flat bed without using bedrails?: A Lot Help needed moving to and from a bed to a chair (including a wheelchair)?: A Little Help needed standing up from a chair using your arms (e.g., wheelchair or bedside chair)?: A Little Help needed to walk in hospital room?: A Lot Help needed climbing 3-5 steps with a railing? : A Lot 6 Click Score: 14  End of Session Equipment Utilized During Treatment: Gait belt Activity Tolerance: Patient limited by fatigue;Patient limited by pain Patient left: in chair;with call bell/phone within reach;with chair alarm set Nurse Communication: Mobility status PT Visit Diagnosis: Other abnormalities of gait and mobility (R26.89);Repeated falls (R29.6);Muscle weakness (generalized) (M62.81);Difficulty in walking, not elsewhere classified (R26.2);Pain Pain - Right/Left: Right Pain - part of body:  (Rib)    Time: 8413-2440 PT Time Calculation (min) (ACUTE ONLY): 24 min   Charges:    PT Evaluation $PT Eval Moderate Complexity: 1 Mod          Ausha Sieh, PT, GCS 08/09/22,10:46 AM

## 2022-08-09 NOTE — Progress Notes (Signed)
Bilateral carotid ultrasound study completed.   Please see CV Procedures for preliminary results.  Brittanie Dosanjh, RVT  1:53 PM 08/09/22

## 2022-08-09 NOTE — Progress Notes (Signed)
PROGRESS NOTE    [name]  WUJ:[id number] DOB: December 20, 1950 DOA: 08/08/2022 PCP: Laurann Montana, MD     Brief Narrative:  [name] is a 72 y.o. female with medical history significant for ductal carcinoma in situ of left breast, DM2, obesity who presented to [facility] ED from home via EMS due to progressive weakness and recurrent falls.  Reportedly, per her sister, she fell several days ago then again yesterday.  After her fall, the patient sat down on the floor for almost 10 hours, could not get up without assistance.  She fell again this morning and was found facedown on the floor where she remained for about 4 hours, covered in urine and feces, unable to get herself up.  EMS was activated.  Upon arrival she complains of generalized weakness and neuropathy in her legs.  No loss of consciousness reported.   In the ED, UA is positive for pyuria, leukocytosis, AKI, elevated CPK, electrolytes abnormalities.  Imaging confirmed multifocal bilateral acute ischemia. Additionally, chest x-ray revealed suspected acute fracture of the anterior right eighth rib.  Patient was admitted to the hospital and neurology consulted.  New events last 24 hours / Subjective: Lives at home alone, has a sister who helps her out.  Has been falling a lot the past few days.  States that she thinks her balance has been off.  Assessment & Plan:   Principal Problem:   CVA (cerebral vascular accident) (HCC) Active Problems:   DM type 2 (diabetes mellitus, type 2) (HCC)   Generalized weakness   Pressure injury of skin   UTI (urinary tract infection)   AKI (acute kidney injury) (HCC)   CKD stage 3a, GFR 45-59 ml/min (HCC)   Right rib fracture   Acute bilateral CVA -MRA head negative -Bilateral carotid artery ultrasound pending -Echocardiogram pending -LDL 58 -A1c 7.0 -PT OT SLP -Stroke team following -Aspirin, Plavix, Lipitor  UTI, present on admission -Empiric Rocephin -Urine culture is pending  AKI  on CKD 3a  -Baseline creatinine 1.4 -IV fluid -Improving  Diabetes mellitus type 2 -A1c 7.0 -Sliding scale insulin  Fall with right eighth rib fracture -Supportive care, incentive spirometry    In agreement with assessment of the pressure ulcer as below:  Pressure Injury 08/08/22 Buttocks Right;Left Stage 1 -  Intact skin with non-blanchable redness of a localized area usually over a bony prominence. (Active)  08/08/22 2200  Location: Buttocks  Location Orientation: Right;Left  Staging: Stage 1 -  Intact skin with non-blanchable redness of a localized area usually over a bony prominence.  Wound Description (Comments):   Present on Admission: Yes  Dressing Type Foam - Lift dressing to assess site every shift 08/09/22 0800         DVT prophylaxis:  enoxaparin (LOVENOX) injection 40 mg Start: 08/09/22 1000  Code Status: Full code Family Communication: No family at bedside Disposition Plan: Pending PT evaluation Status is: Inpatient Remains inpatient appropriate because: PT evaluation, suspect she will need placement    Antimicrobials:  Anti-infectives (From admission, onward)    Start     Dose/Rate Route Frequency Ordered Stop   08/09/22 0030  cefTRIAXone (ROCEPHIN) 1 g in sodium chloride 0.9 % 100 mL IVPB        1 g 200 mL/hr over 30 Minutes Intravenous Daily at bedtime 08/08/22 2339          Objective: Vitals:   08/09/22 0529 08/09/22 0610 08/09/22 0630 08/09/22 0734  BP: (!) 168/58   Marland Kitchen)  182/55  Pulse: 75   77  Resp: 16     Temp: 98.9 F (37.2 C)   98.5 F (36.9 C)  TempSrc: Oral   Oral  SpO2: 97%   99%  Weight:  106.6 kg    Height:   5' 9.5" (1.765 m)     Intake/Output Summary (Last 24 hours) at 08/09/2022 1321 Last data filed at 08/09/2022 1011 [place] per 24 hour  Intake 537 ml  Output 1900 ml  Net -1363 ml   Filed Weights   08/09/22 0610  Weight: 106.6 kg    Examination:  General exam: Appears calm and comfortable  Respiratory system:  Clear to auscultation. Respiratory effort normal. No respiratory distress. No conversational dyspnea.  Cardiovascular system: S1 & S2 heard, RRR. No murmurs. No pedal edema. Gastrointestinal system: Abdomen is nondistended, soft and nontender. Normal bowel sounds heard. Central nervous system: Alert and oriented. No focal neurological deficits. Speech clear.  Bilateral lower extremities weak, but equal Extremities: Symmetric in appearance  Skin: No rashes, lesions or ulcers on exposed skin  Psychiatry: Judgement and insight appear normal. Mood & affect appropriate.   Data Reviewed: I have personally reviewed following labs and imaging studies  CBC: Recent Labs  Lab 08/08/22 1252 08/09/22 0328  WBC 14.8* 9.5  NEUTROABS 11.6*  --   HGB 12.1 9.1*  HCT 36.7 26.5*  MCV 100.3* 95.0  PLT 323 204   Basic Metabolic Panel: Recent Labs  Lab 08/08/22 1252 08/09/22 0328  NA 134* 131*  K 3.8 3.5  CL 99 101  CO2 21* 22  GLUCOSE 172* 146*  BUN 45* 40*  CREATININE 2.29* 1.85*  CALCIUM 9.2 8.2*  MG  --  1.8  PHOS  --  3.9   GFR: Estimated Creatinine Clearance: 36.6 mL/min (A) (by C-G formula based on SCr of 1.85 mg/dL (H)). Liver Function Tests: Recent Labs  Lab 08/08/22 1252 08/09/22 0328  AST 47* 43*  ALT 27 23  ALKPHOS 46 34*  BILITOT 0.7 1.4*  PROT 8.0 5.8*  ALBUMIN 3.4* 2.6*   No results for input(s): "LIPASE", "AMYLASE" in the last 168 hours. No results for input(s): "AMMONIA" in the last 168 hours. Coagulation Profile: No results for input(s): "INR", "PROTIME" in the last 168 hours. Cardiac Enzymes: Recent Labs  Lab 08/08/22 1252  CKTOTAL 1,265*   BNP (last 3 results) No results for input(s): "PROBNP" in the last 8760 hours. HbA1C: Recent Labs    08/09/22 0328  HGBA1C 7.0*   CBG: Recent Labs  Lab 08/08/22 1613 08/09/22 0112 08/09/22 0525 08/09/22 0905 08/09/22 1119  GLUCAP 191* 132* 150* 171* 193*   Lipid Profile: Recent Labs    08/09/22 0328   CHOL 121  HDL 34*  LDLCALC 58  TRIG 161  CHOLHDL 3.6   Thyroid Function Tests: No results for input(s): "TSH", "T4TOTAL", "FREET4", "T3FREE", "THYROIDAB" in the last 72 hours. Anemia Panel: No results for input(s): "VITAMINB12", "FOLATE", "FERRITIN", "TIBC", "IRON", "RETICCTPCT" in the last 72 hours. Sepsis Labs: No results for input(s): "PROCALCITON", "LATICACIDVEN" in the last 168 hours.  No results found for this or any previous visit (from the past 240 hour(s)).    Radiology Studies: MR ANGIO HEAD WO CONTRAST  Result Date: 08/09/2022 CLINICAL DATA:  Stroke follow-up EXAM: MRA HEAD WITHOUT CONTRAST TECHNIQUE: Angiographic images of the Circle of Willis were acquired using MRA technique without intravenous contrast. COMPARISON:  None Available. FINDINGS: POSTERIOR CIRCULATION: --Vertebral arteries: Normal --Inferior cerebellar arteries: Normal. --Basilar artery: Normal. --  Superior cerebellar arteries: Normal. --Posterior cerebral arteries: Normal. ANTERIOR CIRCULATION: --Intracranial internal carotid arteries: Normal. --Anterior cerebral arteries (ACA): Normal. --Middle cerebral arteries (MCA): Normal. Anatomic variants: None Other: None. IMPRESSION: Normal intracranial MRA. Electronically Signed   By: Deatra Robinson M.D.   On: 08/09/2022 01:06   MR BRAIN WO CONTRAST  Result Date: 08/08/2022 CLINICAL DATA:  Altered mental status EXAM: MRI HEAD WITHOUT CONTRAST TECHNIQUE: Multiplanar, multiecho pulse sequences of the brain and surrounding structures were obtained without intravenous contrast. COMPARISON:  06/05/2019 FINDINGS: Brain: Multifocal bilateral acute ischemia, predominantly affecting the deep gray matter. There also 2 cortical foci within the right MCA territory. There are old bilateral MCA territory infarcts. No acute or chronic hemorrhage. There is multifocal hyperintense T2-weighted signal within the white matter. Generalized volume loss. The midline structures are normal.  Vascular: Major flow voids are preserved. Skull and upper cervical spine: Normal calvarium and skull base. Visualized upper cervical spine and soft tissues are normal. Sinuses/Orbits:No paranasal sinus fluid levels or advanced mucosal thickening. No mastoid or middle ear effusion. Normal orbits. IMPRESSION: 1. Multifocal bilateral acute ischemia, predominantly affecting the deep gray matter. There also 2 cortical foci within the right MCA territory. No hemorrhage or mass effect. 2. Old bilateral MCA territory infarcts. 3. Mild volume loss and chronic ischemic microangiopathy. Electronically Signed   By: Deatra Robinson M.D.   On: 08/08/2022 21:34   DG Ribs Unilateral W/Chest Right  Result Date: 08/08/2022 CLINICAL DATA:  De [place]ings[place] 3 times in the past week with right lower anterior rib pain EXAM: RIGHT RIBS AND CHEST - 3+ VIEW COMPARISON:  06/04/2010 FINDINGS: Suspected acute fracture of the anterior right eighth rib. There is no evidence of pneumothorax or pleural effusion. Both lungs are clear. Stable cardiomediastinal silhouette. Aortic atherosclerotic calcification. Cervical spine fusion hardware IMPRESSION: Suspected acute fracture of the anterior right eighth rib. Electronically Signed   By: Minerva Fester M.D.   On: 08/08/2022 20:14   CT Head Wo Contrast  Result Date: 08/08/2022 CLINICAL DATA:  Provided history: Polytrauma, blunt. Additional history provided: Fall. Patient found down. EXAM: CT HEAD WITHOUT CONTRAST CT CERVICAL SPINE WITHOUT CONTRAST TECHNIQUE: Multidetector CT imaging of the head and cervical spine was performed following the standard protocol without intravenous contrast. Multiplanar CT image reconstructions of the cervical spine were also generated. RADIATION DOSE REDUCTION: This exam was performed according to the departmental dose-optimization program which includes automated exposure control, adjustment of the mA and/or kV according to patient size and/or use of iterative  reconstruction technique. COMPARISON:  Brain MRI 06/05/2019.  Head CT 05/14/2019. FINDINGS: CT HEAD FINDINGS Brain: Generalized cerebral atrophy. Redemonstrated chronic cortically-based infarcts within the bilateral frontal lobes and left parietal lobe. 16 x 5 mm age-indeterminate infarct centered within the left corona radiata and caudate nucleus, new from the prior brain MRI of 06/05/2019 but otherwise age indeterminate. Background mild patchy and ill-defined hypoattenuation within the cerebral white matter, nonspecific but compatible with chronic small ischemic disease. There is no acute intracranial hemorrhage. No extra-axial fluid collection. No evidence of an intracranial mass. No midline shift. Vascular: No hyperdense vessel.  Atherosclerotic calcifications. Skull: No fracture or aggressive osseous lesion. Sinuses/Orbits: No mass or acute finding within the imaged orbits. No significant paranasal sinus disease. CT CERVICAL SPINE FINDINGS Alignment: No significant spondylolisthesis. Skull base and vertebrae: The basion-dental and atlanto-dental intervals are maintained.No evidence of acute fracture to the cervical spine. Soft tissues and spinal canal: No prevertebral fluid or swelling. No visible canal hematoma. Disc levels: Prior  C5-C6 ACDF. Solid arthrodesis across the C5-C6 disc space. No evidence of acute hardware compromise. Background cervical spondylosis with multilevel disc space narrowing and disc bulges/central disc protrusions. Disc space narrowing is greatest at C4-C5 (moderately advanced at this level). No appreciable high-grade spinal canal stenosis. No significant bony neural foraminal narrowing. Upper chest: No consolidation within the imaged lung apices. No visible pneumothorax. IMPRESSION: CT head: 1. 16 x 5 mm infarct within the left corona radiata/caudate nucleus, new from the prior brain MRI of 06/05/2019 but otherwise age indeterminate. Consider a brain MRI for further evaluation. 2. No  acute posttraumatic intracranial findings. 3. Redemonstrated chronic cortically-based infarcts within bilateral frontal and left parietal lobes. 4. Background mild cerebral white matter chronic small vessel ischemic disease. 5. Generalized cerebral atrophy. CT cervical spine: 1. No evidence of acute fracture to the cervical spine. 2. Prior C5-C6 ACDF. 3. Cervical spondylosis as described. Electronically Signed   By: Jackey Loge D.O.   On: 08/08/2022 14:05   CT Cervical Spine Wo Contrast  Result Date: 08/08/2022 CLINICAL DATA:  Provided history: Polytrauma, blunt. Additional history provided: Fall. Patient found down. EXAM: CT HEAD WITHOUT CONTRAST CT CERVICAL SPINE WITHOUT CONTRAST TECHNIQUE: Multidetector CT imaging of the head and cervical spine was performed following the standard protocol without intravenous contrast. Multiplanar CT image reconstructions of the cervical spine were also generated. RADIATION DOSE REDUCTION: This exam was performed according to the departmental dose-optimization program which includes automated exposure control, adjustment of the mA and/or kV according to patient size and/or use of iterative reconstruction technique. COMPARISON:  Brain MRI 06/05/2019.  Head CT 05/14/2019. FINDINGS: CT HEAD FINDINGS Brain: Generalized cerebral atrophy. Redemonstrated chronic cortically-based infarcts within the bilateral frontal lobes and left parietal lobe. 16 x 5 mm age-indeterminate infarct centered within the left corona radiata and caudate nucleus, new from the prior brain MRI of 06/05/2019 but otherwise age indeterminate. Background mild patchy and ill-defined hypoattenuation within the cerebral white matter, nonspecific but compatible with chronic small ischemic disease. There is no acute intracranial hemorrhage. No extra-axial fluid collection. No evidence of an intracranial mass. No midline shift. Vascular: No hyperdense vessel.  Atherosclerotic calcifications. Skull: No fracture or  aggressive osseous lesion. Sinuses/Orbits: No mass or acute finding within the imaged orbits. No significant paranasal sinus disease. CT CERVICAL SPINE FINDINGS Alignment: No significant spondylolisthesis. Skull base and vertebrae: The basion-dental and atlanto-dental intervals are maintained.No evidence of acute fracture to the cervical spine. Soft tissues and spinal canal: No prevertebral fluid or swelling. No visible canal hematoma. Disc levels: Prior C5-C6 ACDF. Solid arthrodesis across the C5-C6 disc space. No evidence of acute hardware compromise. Background cervical spondylosis with multilevel disc space narrowing and disc bulges/central disc protrusions. Disc space narrowing is greatest at C4-C5 (moderately advanced at this level). No appreciable high-grade spinal canal stenosis. No significant bony neural foraminal narrowing. Upper chest: No consolidation within the imaged lung apices. No visible pneumothorax. IMPRESSION: CT head: 1. 16 x 5 mm infarct within the left corona radiata/caudate nucleus, new from the prior brain MRI of 06/05/2019 but otherwise age indeterminate. Consider a brain MRI for further evaluation. 2. No acute posttraumatic intracranial findings. 3. Redemonstrated chronic cortically-based infarcts within bilateral frontal and left parietal lobes. 4. Background mild cerebral white matter chronic small vessel ischemic disease. 5. Generalized cerebral atrophy. CT cervical spine: 1. No evidence of acute fracture to the cervical spine. 2. Prior C5-C6 ACDF. 3. Cervical spondylosis as described. Electronically Signed   By: Jackey Loge D.O.  On: 08/08/2022 14:05      Scheduled Meds:  aspirin EC  81 mg Oral Daily   atorvastatin  80 mg Oral Daily   clopidogrel  75 mg Oral Daily   enoxaparin (LOVENOX) injection  40 mg Subcutaneous Daily   insulin aspart  0-9 Units Subcutaneous TID WC   Continuous Infusions:  sodium chloride     cefTRIAXone (ROCEPHIN)  IV 1 g (08/09/22 0121)      LOS: 1 day   Time spent: 35 minutes   Noralee Stain, DO [facility] 08/09/2022, 1:21 PM   Available via Epic secure chat 7am-7pm After these hours, please refer to coverage provider listed on [email or web address]

## 2022-08-09 NOTE — Consult Note (Addendum)
Physical Medicine and Rehabilitation Consult Reason for Consult: altered functional mobility after CVA Referring Physician: Alvino Chapel   HPI: [name] is a 72 y.o. female with a past history of breast cancer, diabetes, obesity, obstructive sleep apnea who presented to [facility] on 08/08/2022 with progressive weakness and falls.  Apparently she was on the ground for about 10 hours as she was unable to get up after her latest fall.  She has had increasing swelling in both of her legs and apparently stopped taking her Lasix a couple months back.  MRI of the brain demonstrated multi focal bilateral acute areas of ischemia predominantly affecting the deep gray matter.  There are also 2 cortical foci within the right MCA territory.  No hemorrhage was seen. Right 8th rib fracture found on xray.   Patient was seen by neurology today who recommends aspirin along with Plavix for 21 days followed by aspirin alone.  Patient is also being treated for a presumptive UTI and is on Rocephin.  She is also suffering from acute kidney injury likely due to poor oral intake.  Additionally she is hyponatremic and she is dealing with rhabdomyolysis due to her prolonged course on the floor.  Patient was evaluated by therapies today and was min assist for sit to stand transfers +2.  She was able to take steps today and use a rolling walker to move 3 feet with min guard assist +2.  She is limited by fatigue.  With Occupational Therapy she is min assist for upper body bathing dressing and total assist +2 for lower body.  Patient lives alone but has a sister who can stay with her as needed.  She has a 1 level house with 5 steps to enter.  She walk with a cane outside the house prior to arrival.   Review of Systems  Constitutional:  Positive for malaise/fatigue. Negative for fever.  HENT: Negative.    Eyes: Negative.   Respiratory:  Positive for shortness of breath.   Cardiovascular:  Positive for leg swelling.   Gastrointestinal:  Negative for nausea and vomiting.  Genitourinary: Negative.   Musculoskeletal:  Positive for falls, joint pain and myalgias.  Skin:  Negative for rash.  Neurological:  Positive for dizziness, sensory change, focal weakness and weakness.  Psychiatric/Behavioral:  Positive for depression.    Past Medical History:  Diagnosis Date   Breast cancer (HCC) 12/2019   left breast DCIS   CKD (chronic kidney disease) 06/07/2016   Stage 2, GFR 60-89 ml/min   Diabetic polyneuropathy 06/07/2016   Dyslipidemia    Endometrial adenocarcinoma 2012   Fever blister    Hyperlipidemia    Hypertension, essential 06/07/2016   Major depressive disorder    Mixed dyslipidemia 06/07/2016   MRSA infection 10/2019   left foot wound   Obstructive sleep apnea 06/07/2016   does not use CPAP   Peripheral neuropathy    Retinopathy    Severe obesity    Skin ulcer of left foot with fat layer exposed 11/02/2016   Stroke    Asymptomatic, discovered via neuroimaging; small infarct in left parietal lobe; also concern for small b/l frontal infarcts   Tachycardia 07/19/2017   Type 2 diabetes mellitus with hyperglycemia, with long-term current use of insulin 06/07/2016   Unilateral primary osteoarthritis, right knee 04/16/2019   Past Surgical History:  Procedure Laterality Date   ADRENALECTOMY     BREAST LUMPECTOMY WITH RADIOACTIVE SEED LOCALIZATION Left 02/25/2020   Procedure: LEFT  BREAST LUMPECTOMY WITH RADIOACTIVE SEED LOCALIZATION;  Surgeon: Abigail Miyamoto, MD;  Location: [facility];  Service: General;  Laterality: Left;   BUNIONECTOMY     CARDIAC CATHETERIZATION     CERVICAL SPINE SURGERY     CHOLECYSTECTOMY     DILATION AND CURETTAGE OF UTERUS     LAPAROSCOPIC SALPINGOOPHERECTOMY     THORACOTOMY     TONSILLECTOMY     Family History  Problem Relation Age of Onset   Hypertension Mother    Alzheimer's disease Mother    Heart attack Father    CAD Father    Alzheimer's disease  Father    Bladder Cancer Father        dx late 36s   Diverticulitis Sister    Obesity Sister    Hypertension Sister    Voice disorder Brother    Breast cancer Paternal Aunt 57   Prostate cancer Paternal Uncle        dx mid 45s   Social History:  reports that she has never smoked. She has never used smokeless tobacco. She reports that she does not drink alcohol and does not use drugs. Allergies:  Allergies  Allergen Reactions   Amitriptyline Other (See Comments)   Ciprofibrate Other (See Comments)   Ciprofloxacin Other (See Comments)   Pristiq [Desvenlafaxine] Other (See Comments)   Sulfur     Other Reaction(s): Unknown   Penicillins Rash   Sulfa Antibiotics Rash   Sulfamethoxazole Rash   Medications Prior to Admission  Medication Sig Dispense Refill   buPROPion (WELLBUTRIN XL) 300 MG 24 hr tablet Take 300 mg by mouth daily.     carvedilol (COREG) 6.25 MG tablet Take 6.25 mg by mouth 2 (two) times daily.     doxycycline (VIBRAMYCIN) 100 MG capsule Take 1 capsule (100 mg total) by mouth 2 (two) times daily. 20 capsule 0   escitalopram (LEXAPRO) 10 MG tablet Take 10 mg by mouth daily.     escitalopram (LEXAPRO) 20 MG tablet Take 20 mg by mouth daily.     FLUoxetine (PROZAC) 40 MG capsule Take 40 mg by mouth daily.   0   furosemide (LASIX) 40 MG tablet Take 40 mg by mouth daily as needed.     insulin NPH-regular Human (NOVOLIN 70/30) (70-30) 100 UNIT/ML injection Inject into the skin. 24 ml in the morning and 20 ml in the evening     losartan (COZAAR) 50 MG tablet Take 50 mg by mouth every morning.     metFORMIN (GLUCOPHAGE-XR) 500 MG 24 hr tablet Take 1,000 mg by mouth in the morning and at bedtime. PT TAKES 2000 MG DAILY     MONOJECT INSULIN SYRINGE 31G X 5/16" 1 ML MISC See admin instructions.     mupirocin ointment (BACTROBAN) 2 % Apply 1 application topically daily.     nutrition supplement, JUVEN, (JUVEN) PACK Take 1 packet by mouth 2 (two) times daily between meals. 30  packet 2   nystatin cream (MYCOSTATIN) 1 APPLICATION TWICE A DAY EXTERNALLY     ONETOUCH VERIO test strip      pravastatin (PRAVACHOL) 40 MG tablet Take 1 tablet once a day for cholesterol     tamoxifen (NOLVADEX) 20 MG tablet TAKE ONE TABLET BY MOUTH EVERY MORNING 90 tablet 0    Home: Home Living Family/patient expects to be discharged to:: Private residence Living Arrangements: Alone Available Help at Discharge: Family, Available PRN/intermittently Type of Home: House Home Access: Stairs to enter [employer] of  Steps: 5 Entrance Stairs-Rails: Right, Left Home Layout: One level Bathroom Shower/Tub: Health visitor: Handicapped height Home Equipment: Cane - single point, Agricultural consultant (2 wheels), Shower seat, Hand held shower head  Functional History: Prior Function Prior Level of Function : Independent/Modified Independent, Driving Mobility Comments: walked with cane out of the home, no AD inside ADLs Comments: sits to shower Functional Status:  Mobility: Bed Mobility Overal bed mobility: Needs Assistance Bed Mobility: Rolling, Sidelying to Sit Rolling: Max assist, +2 for physical assistance Sidelying to sit: Mod assist, +2 for physical assistance, HOB elevated Transfers Overall transfer level: Needs assistance Equipment used: Rolling walker (2 wheels) Transfers: Sit to/from Stand Sit to Stand: Min assist, +2 physical assistance, From elevated surface General transfer comment: increased time to power up to standing Ambulation/Gait Ambulation/Gait assistance: Min guard, +2 physical assistance, +2 safety/equipment Gait Distance (Feet): 3 Feet Assistive device: Rolling walker (2 wheels) Gait Pattern/deviations: Step-to pattern, Decreased step length - right, Decreased step length - left, Decreased stride length General Gait Details: patient able to take a few steps from bed to recliner. Limited by fatigue Gait velocity: decr     ADL: ADL Overall ADL's : Needs assistance/impaired Eating/Feeding: Independent Grooming: Set up, Sitting Upper Body Bathing: Minimal assistance, Sitting Lower Body Bathing: Total assistance, +2 for physical assistance, Sit to/from stand Upper Body Dressing : Minimal assistance, Sitting Lower Body Dressing: Total assistance, Bed level Toilet Transfer: +2 for physical assistance, Minimal assistance, Rolling walker (2 wheels), Ambulation Functional mobility during ADLs: +2 for physical assistance, Minimal assistance  Cognition: Cognition Overall Cognitive Status: Within Functional Limits for tasks assessed Orientation Level: Oriented X4 Cognition Arousal/Alertness: Awake/alert Behavior During Therapy: Flat affect Overall Cognitive Status: Within Functional Limits for tasks assessed  Blood pressure (!) 182/55, pulse 77, temperature 98.5 F (36.9 C), temperature source Oral, resp. rate 16, height 5' 9.5" (1.765 m), weight 106.6 kg, SpO2 99 %. Physical Exam Constitutional:      General: She is not in acute distress.    Appearance: She is obese.  HENT:     Head: Normocephalic and atraumatic.     Nose: Nose normal.     Mouth/Throat:     Mouth: Mucous membranes are moist.  Eyes:     Conjunctiva/sclera: Conjunctivae normal.  Cardiovascular:     Rate and Rhythm: Normal rate.  Pulmonary:     Effort: Pulmonary effort is normal.  Abdominal:     Palpations: Abdomen is soft.  Musculoskeletal:     Cervical back: Normal range of motion.     Right lower leg: Edema present.     Left lower leg: Edema present.     Comments: Right great toe amp. Severe hallux valgus left great toe. Right mid rib cage tender to palpation and movement of trunk or right arm.  Skin:    Comments: Callus left first MTP  Neurological:     Comments: Alert and oriented x 3. Reasonable insight and awareness. Fair Memory. Normal language and speech. Occasional processing delays. Cranial nerve exam unremarkable.  MMT: UE nearly 4+ to 5/5 with pain inhibition d/t right ribs. BLE: 1+ to 2- HF, 2/5 KE and 4/5 ADF/PF. Stocking glove sensory loss in both legs up to knees. DTR's 1+, no abnl resting tone.    Psychiatric:     Comments: Pt slightly flat but otherwise appropriate and cooperative.      Results for orders placed or performed during the hospital encounter of 08/08/22 (from the past 24 hour(s))  CBG  monitoring, ED     Status: Abnormal   Collection Time: 08/08/22  4:13 PM  Result Value Ref Range   Glucose-Capillary 191 (H) 70 - 99 mg/dL  Urinalysis, Routine w reflex microscopic -Urine, Clean Catch     Status: Abnormal   Collection Time: 08/08/22  6:52 PM  Result Value Ref Range   Color, Urine AMBER (A) YELLOW   APPearance CLOUDY (A) CLEAR   Specific Gravity, Urine 1.016 1.005 - 1.030   pH 6.0 5.0 - 8.0   Glucose, UA NEGATIVE NEGATIVE mg/dL   Hgb urine dipstick MODERATE (A) NEGATIVE   Bilirubin Urine NEGATIVE NEGATIVE   Ketones, ur NEGATIVE NEGATIVE mg/dL   Protein, ur >=811 (A) NEGATIVE mg/dL   Nitrite NEGATIVE NEGATIVE   Leukocytes,Ua LARGE (A) NEGATIVE   RBC / HPF 6-10 0 - 5 RBC/hpf   WBC, UA >50 0 - 5 WBC/hpf   Bacteria, UA MANY (A) NONE SEEN   Squamous Epithelial / HPF 6-10 0 - 5 /HPF   Non Squamous Epithelial 0-5 (A) NONE SEEN  Glucose, capillary     Status: Abnormal   Collection Time: 08/09/22  1:12 AM  Result Value Ref Range   Glucose-Capillary 132 (H) 70 - 99 mg/dL  CBC     Status: Abnormal   Collection Time: 08/09/22  3:28 AM  Result Value Ref Range   WBC 9.5 4.0 - 10.5 K/uL   RBC 2.79 (L) 3.87 - 5.11 MIL/uL   Hemoglobin 9.1 (L) 12.0 - 15.0 g/dL   HCT 91.4 (L) 78.2 - 95.6 %   MCV 95.0 80.0 - 100.0 fL   MCH 32.6 26.0 - 34.0 pg   MCHC 34.3 30.0 - 36.0 g/dL   RDW 21.3 08.6 - 57.8 %   Platelets 204 150 - 400 K/uL   nRBC 0.0 0.0 - 0.2 %  Comprehensive metabolic panel     Status: Abnormal   Collection Time: 08/09/22  3:28 AM  Result Value Ref Range   Sodium 131 (L) 135 -  145 mmol/L   Potassium 3.5 3.5 - 5.1 mmol/L   Chloride 101 98 - 111 mmol/L   CO2 22 22 - 32 mmol/L   Glucose, Bld 146 (H) 70 - 99 mg/dL   BUN 40 (H) 8 - 23 mg/dL   Creatinine, Ser 4.69 (H) 0.44 - 1.00 mg/dL   Calcium 8.2 (L) 8.9 - 10.3 mg/dL   Total Protein 5.8 (L) 6.5 - 8.1 g/dL   Albumin 2.6 (L) 3.5 - 5.0 g/dL   AST 43 (H) 15 - 41 U/L   ALT 23 0 - 44 U/L   Alkaline Phosphatase 34 (L) 38 - 126 U/L   Total Bilirubin 1.4 (H) 0.3 - 1.2 mg/dL   GFR, Estimated 29 (L) >60 mL/min   Anion gap 8 5 - 15  Magnesium     Status: None   Collection Time: 08/09/22  3:28 AM  Result Value Ref Range   Magnesium 1.8 1.7 - 2.4 mg/dL  Phosphorus     Status: None   Collection Time: 08/09/22  3:28 AM  Result Value Ref Range   Phosphorus 3.9 2.5 - 4.6 mg/dL  Lipid panel     Status: Abnormal   Collection Time: 08/09/22  3:28 AM  Result Value Ref Range   Cholesterol 121 0 - 200 mg/dL   Triglycerides 629 <528 mg/dL   HDL 34 (L) >41 mg/dL   Total CHOL/HDL Ratio 3.6 RATIO   VLDL 29 0 - 40 mg/dL  LDL Cholesterol 58 0 - 99 mg/dL  Hemoglobin Z6X     Status: Abnormal   Collection Time: 08/09/22  3:28 AM  Result Value Ref Range   Hgb A1c MFr Bld 7.0 (H) 4.8 - 5.6 %   Mean Plasma Glucose 154.2 mg/dL  Glucose, capillary     Status: Abnormal   Collection Time: 08/09/22  5:25 AM  Result Value Ref Range   Glucose-Capillary 150 (H) 70 - 99 mg/dL  Glucose, capillary     Status: Abnormal   Collection Time: 08/09/22  9:05 AM  Result Value Ref Range   Glucose-Capillary 171 (H) 70 - 99 mg/dL  Glucose, capillary     Status: Abnormal   Collection Time: 08/09/22 11:19 AM  Result Value Ref Range   Glucose-Capillary 193 (H) 70 - 99 mg/dL   MR ANGIO HEAD WO CONTRAST  Result Date: 08/09/2022 CLINICAL DATA:  Stroke follow-up EXAM: MRA HEAD WITHOUT CONTRAST TECHNIQUE: Angiographic images of the Circle of Willis were acquired using MRA technique without intravenous contrast. COMPARISON:  None Available. FINDINGS:  POSTERIOR CIRCULATION: --Vertebral arteries: Normal --Inferior cerebellar arteries: Normal. --Basilar artery: Normal. --Superior cerebellar arteries: Normal. --Posterior cerebral arteries: Normal. ANTERIOR CIRCULATION: --Intracranial internal carotid arteries: Normal. --Anterior cerebral arteries (ACA): Normal. --Middle cerebral arteries (MCA): Normal. Anatomic variants: None Other: None. IMPRESSION: Normal intracranial MRA. Electronically Signed   By: Deatra Robinson M.D.   On: 08/09/2022 01:06   MR BRAIN WO CONTRAST  Result Date: 08/08/2022 CLINICAL DATA:  Altered mental status EXAM: MRI HEAD WITHOUT CONTRAST TECHNIQUE: Multiplanar, multiecho pulse sequences of the brain and surrounding structures were obtained without intravenous contrast. COMPARISON:  06/05/2019 FINDINGS: Brain: Multifocal bilateral acute ischemia, predominantly affecting the deep gray matter. There also 2 cortical foci within the right MCA territory. There are old bilateral MCA territory infarcts. No acute or chronic hemorrhage. There is multifocal hyperintense T2-weighted signal within the white matter. Generalized volume loss. The midline structures are normal. Vascular: Major flow voids are preserved. Skull and upper cervical spine: Normal calvarium and skull base. Visualized upper cervical spine and soft tissues are normal. Sinuses/Orbits:No paranasal sinus fluid levels or advanced mucosal thickening. No mastoid or middle ear effusion. Normal orbits. IMPRESSION: 1. Multifocal bilateral acute ischemia, predominantly affecting the deep gray matter. There also 2 cortical foci within the right MCA territory. No hemorrhage or mass effect. 2. Old bilateral MCA territory infarcts. 3. Mild volume loss and chronic ischemic microangiopathy. Electronically Signed   By: Deatra Robinson M.D.   On: 08/08/2022 21:34   DG Ribs Unilateral W/Chest Right  Result Date: 08/08/2022 CLINICAL DATA:  De Hollingshead 3 times in the past week with right lower anterior rib  pain EXAM: RIGHT RIBS AND CHEST - 3+ VIEW COMPARISON:  06/04/2010 FINDINGS: Suspected acute fracture of the anterior right eighth rib. There is no evidence of pneumothorax or pleural effusion. Both lungs are clear. Stable cardiomediastinal silhouette. Aortic atherosclerotic calcification. Cervical spine fusion hardware IMPRESSION: Suspected acute fracture of the anterior right eighth rib. Electronically Signed   By: Minerva Fester M.D.   On: 08/08/2022 20:14   CT Head Wo Contrast  Result Date: 08/08/2022 CLINICAL DATA:  Provided history: Polytrauma, blunt. Additional history provided: Fall. Patient found down. EXAM: CT HEAD WITHOUT CONTRAST CT CERVICAL SPINE WITHOUT CONTRAST TECHNIQUE: Multidetector CT imaging of the head and cervical spine was performed following the standard protocol without intravenous contrast. Multiplanar CT image reconstructions of the cervical spine were also generated. RADIATION DOSE REDUCTION: This exam was performed according  to the departmental dose-optimization program which includes automated exposure control, adjustment of the mA and/or kV according to patient size and/or use of iterative reconstruction technique. COMPARISON:  Brain MRI 06/05/2019.  Head CT 05/14/2019. FINDINGS: CT HEAD FINDINGS Brain: Generalized cerebral atrophy. Redemonstrated chronic cortically-based infarcts within the bilateral frontal lobes and left parietal lobe. 16 x 5 mm age-indeterminate infarct centered within the left corona radiata and caudate nucleus, new from the prior brain MRI of 06/05/2019 but otherwise age indeterminate. Background mild patchy and ill-defined hypoattenuation within the cerebral white matter, nonspecific but compatible with chronic small ischemic disease. There is no acute intracranial hemorrhage. No extra-axial fluid collection. No evidence of an intracranial mass. No midline shift. Vascular: No hyperdense vessel.  Atherosclerotic calcifications. Skull: No fracture or  aggressive osseous lesion. Sinuses/Orbits: No mass or acute finding within the imaged orbits. No significant paranasal sinus disease. CT CERVICAL SPINE FINDINGS Alignment: No significant spondylolisthesis. Skull base and vertebrae: The basion-dental and atlanto-dental intervals are maintained.No evidence of acute fracture to the cervical spine. Soft tissues and spinal canal: No prevertebral fluid or swelling. No visible canal hematoma. Disc levels: Prior C5-C6 ACDF. Solid arthrodesis across the C5-C6 disc space. No evidence of acute hardware compromise. Background cervical spondylosis with multilevel disc space narrowing and disc bulges/central disc protrusions. Disc space narrowing is greatest at C4-C5 (moderately advanced at this level). No appreciable high-grade spinal canal stenosis. No significant bony neural foraminal narrowing. Upper chest: No consolidation within the imaged lung apices. No visible pneumothorax. IMPRESSION: CT head: 1. 16 x 5 mm infarct within the left corona radiata/caudate nucleus, new from the prior brain MRI of 06/05/2019 but otherwise age indeterminate. Consider a brain MRI for further evaluation. 2. No acute posttraumatic intracranial findings. 3. Redemonstrated chronic cortically-based infarcts within bilateral frontal and left parietal lobes. 4. Background mild cerebral white matter chronic small vessel ischemic disease. 5. Generalized cerebral atrophy. CT cervical spine: 1. No evidence of acute fracture to the cervical spine. 2. Prior C5-C6 ACDF. 3. Cervical spondylosis as described. Electronically Signed   By: Jackey Loge D.O.   On: 08/08/2022 14:05   CT Cervical Spine Wo Contrast  Result Date: 08/08/2022 CLINICAL DATA:  Provided history: Polytrauma, blunt. Additional history provided: Fall. Patient found down. EXAM: CT HEAD WITHOUT CONTRAST CT CERVICAL SPINE WITHOUT CONTRAST TECHNIQUE: Multidetector CT imaging of the head and cervical spine was performed following the standard  protocol without intravenous contrast. Multiplanar CT image reconstructions of the cervical spine were also generated. RADIATION DOSE REDUCTION: This exam was performed according to the departmental dose-optimization program which includes automated exposure control, adjustment of the mA and/or kV according to patient size and/or use of iterative reconstruction technique. COMPARISON:  Brain MRI 06/05/2019.  Head CT 05/14/2019. FINDINGS: CT HEAD FINDINGS Brain: Generalized cerebral atrophy. Redemonstrated chronic cortically-based infarcts within the bilateral frontal lobes and left parietal lobe. 16 x 5 mm age-indeterminate infarct centered within the left corona radiata and caudate nucleus, new from the prior brain MRI of 06/05/2019 but otherwise age indeterminate. Background mild patchy and ill-defined hypoattenuation within the cerebral white matter, nonspecific but compatible with chronic small ischemic disease. There is no acute intracranial hemorrhage. No extra-axial fluid collection. No evidence of an intracranial mass. No midline shift. Vascular: No hyperdense vessel.  Atherosclerotic calcifications. Skull: No fracture or aggressive osseous lesion. Sinuses/Orbits: No mass or acute finding within the imaged orbits. No significant paranasal sinus disease. CT CERVICAL SPINE FINDINGS Alignment: No significant spondylolisthesis. Skull base and vertebrae: The basion-dental  and atlanto-dental intervals are maintained.No evidence of acute fracture to the cervical spine. Soft tissues and spinal canal: No prevertebral fluid or swelling. No visible canal hematoma. Disc levels: Prior C5-C6 ACDF. Solid arthrodesis across the C5-C6 disc space. No evidence of acute hardware compromise. Background cervical spondylosis with multilevel disc space narrowing and disc bulges/central disc protrusions. Disc space narrowing is greatest at C4-C5 (moderately advanced at this level). No appreciable high-grade spinal canal stenosis. No  significant bony neural foraminal narrowing. Upper chest: No consolidation within the imaged lung apices. No visible pneumothorax. IMPRESSION: CT head: 1. 16 x 5 mm infarct within the left corona radiata/caudate nucleus, new from the prior brain MRI of 06/05/2019 but otherwise age indeterminate. Consider a brain MRI for further evaluation. 2. No acute posttraumatic intracranial findings. 3. Redemonstrated chronic cortically-based infarcts within bilateral frontal and left parietal lobes. 4. Background mild cerebral white matter chronic small vessel ischemic disease. 5. Generalized cerebral atrophy. CT cervical spine: 1. No evidence of acute fracture to the cervical spine. 2. Prior C5-C6 ACDF. 3. Cervical spondylosis as described. Electronically Signed   By: Jackey Loge D.O.   On: 08/08/2022 14:05    Assessment/Plan: Diagnosis: 72 year old female with functional deficits due to bi-cerebral infarcts in the setting of significant bilateral lower extremity edema and rhabdomyolysis. Does the need for close, 24 hr/day medical supervision in concert with the patient's rehab needs make it unreasonable for this patient to be served in a less intensive setting? Yes Co-Morbidities requiring supervision/potential complications:  -UTI/ID -AKI/nutritional status -Lower extremity edema -rhabdomyolysis -depression -hyponatremia -type 2 diabetes with diabetic peripheral neuropathy -obesity -right 8th rib fracture after fall  Due to bladder management, bowel management, safety, skin/wound care, disease management, medication administration, pain management, and patient education, does the patient require 24 hr/day rehab nursing? Yes Does the patient require coordinated care of a physician, rehab nurse, therapy disciplines of PT, OT, SLP to address physical and functional deficits in the context of the above medical diagnosis(es)? Yes Addressing deficits in the following areas: balance, endurance, locomotion,  strength, transferring, bowel/bladder control, bathing, dressing, feeding, grooming, toileting, cognition, and psychosocial support Can the patient actively participate in an intensive therapy program of at least 3 hrs of therapy per day at least 5 days per week? Yes The potential for patient to make measurable gains while on inpatient rehab is excellent Anticipated functional outcomes upon discharge from inpatient rehab are supervision  with PT, supervision and min assist with OT, modified independent with SLP. Estimated rehab length of stay to reach the above functional goals is: 13-19 days Anticipated discharge destination: Home Overall Rehab/Functional Prognosis: excellent  POST ACUTE RECOMMENDATIONS: This patient's condition is appropriate for continued rehabilitative care in the following setting: CIR Patient has agreed to participate in recommended program. Yes Note that insurance prior authorization may be required for reimbursement for recommended care.  Comment: Pt also has been dealing with significant depression which is one of the reasons she let things go and is now in the position she's in.  Sister is very supportive and available to help at home. She has HPOA. Rehab Admissions Coordinator to follow up    MEDICAL RECOMMENDATIONS: Would benefit from neuropsychological counseling to address coping skills. Probably needs adjustment of her depression medication regimen as well.  Elevation and compression (ACE wrap) of lower extremities for edema control   I have personally performed a face to face diagnostic evaluation of this patient. Additionally, I have examined the patient's medical record including any pertinent  labs and radiographic images. If the physician assistant has documented in this note, I have reviewed and edited or otherwise concur with the physician assistant's documentation.  Thanks,  Ranelle Oyster, MD 08/09/2022

## 2022-08-09 NOTE — Progress Notes (Signed)
Patient left floor for MRI in no acute distress

## 2022-08-09 NOTE — Progress Notes (Signed)
Inpatient Rehab Admissions Coordinator:  ? ?Per therapy recommendations,  patient was screened for CIR candidacy by Syndi Pua, MS, CCC-SLP. At this time, Pt. Appears to be a a potential candidate for CIR. I will place   order for rehab consult per protocol for full assessment. Please contact me any with questions. ? ?Loudon Krakow, MS, CCC-SLP ?Rehab Admissions Coordinator  ?[phone] (celll) ?[phone] (office) ? ?

## 2022-08-09 NOTE — Progress Notes (Signed)
Echocardiogram 2D Echocardiogram has been performed.  Toni Amend 08/09/2022, 11:29 AM

## 2022-08-09 NOTE — Progress Notes (Signed)
   08/09/22 0800  Pre-Screen Questions- If "YES" to any of the following questions, STOP the screen, keep NPO, and place order for SLP eval and treat   Home diet required thickened liquids No  Trach tube present No  Radiation to Head/Neck No  Patient Readiness for Screen - If "YES" to any of the following questions, WAIT to screen, keep NPO  Is patient lethargic or unable to stay alert/awake? No  HOB restricted to <30 degrees No  NPO for planned procedure No  Brief Cognitive Screen- Aspiration Risk Assessment  Is patient oriented to name, place, or year? Yes  Able to open mouth, stick out tongue, or smile Yes  Oral Mechanism Exam  Able to seal lips Yes  Able to move tongue from side to side Yes  Face is symmetric Yes  Mandatory Oral Care Performed   Mandatory oral care performed Yes  3 oz Water Swallow Challenge  Does the patient stop drinking? No  Does the patient cough/choke? No  Screening Result Passed

## 2022-08-09 NOTE — Progress Notes (Signed)
SLP Cancellation Note  Patient Details Name: [name] MRN: [id number] DOB: 1950-09-11   Cancelled evaluation: Pt passed [facility] swallow screen; per protocol, no SLP swallow eval is warranted. Our service will sign off.  Sebastian Dzik L. Samson Frederic, MA CCC/SLP Clinical Specialist - Acute Care SLP Acute Rehabilitation Services Office number [phone]          Blenda Mounts Laurice 08/09/2022, 9:17 AM

## 2022-08-09 NOTE — Progress Notes (Signed)
Patient brought back from MRI, alert and oriented, in NAD. VSS

## 2022-08-09 NOTE — Evaluation (Signed)
Occupational Therapy Evaluation Patient Details Name: [name] MRN: [id number] DOB: [birth year] Today's Date: 08/09/2022   History of Present Illness [name] is a 72 y.o. female with medical history significant for ductal carcinoma in situ of left breast, DM2, obesity who presented to [facility] ED from home via EMS due to progressive weakness and recurrent falls.  Reportedly, per her sister, she fell several days ago then again yesterday.  After her fall, the patient sat down on the floor for almost 10 hours, could not get up without assistance.  She fell again this morning and was found facedown on the floor where she remained for about 4 hours, covered in urine and feces, unable to get herself up.  EMS was activated.  Upon arrival she complains of generalized weakness and neuropathy in her legs.  No loss of consciousness reported. MRI brain w/o c which demonstrates Multifocal bilateral acute ischemia, predominantly affecting the deep gray matter. There also 2 cortical foci within the right MCA  territory. No hemorrhage or mass effect.  Patient also has R rib fx.   Clinical Impression   Pt lives alone and typically walks with a cane outside of her home only. She is independent in ADLs, IADLs and drives. Pt presents with pain in her R ribs, generalized weakness and impaired standing balance. She requires set up to total assist for ADLs and +2 max to min assist for mobility. Patient will benefit from intensive inpatient follow up therapy, >3 hours/day upon discharge.        Recommendations for follow up therapy are one component of a multi-disciplinary discharge planning process, led by the attending physician.  Recommendations may be updated based on patient status, additional functional criteria and insurance authorization.   Assistance Recommended at Discharge Frequent or constant Supervision/Assistance  Patient can return home with the following Two people to help with walking and/or  transfers;A lot of help with bathing/dressing/bathroom;Assistance with cooking/housework;Assist for transportation;Help with stairs or ramp for entrance    Functional Status Assessment  Patient has had a recent decline in their functional status and demonstrates the ability to make significant improvements in function in a reasonable and predictable amount of time.  Equipment Recommendations  BSC/3in1    Recommendations for Other Services       Precautions / Restrictions Precautions Precautions: Fall Restrictions Weight Bearing Restrictions: No      Mobility Bed Mobility Overal bed mobility: Needs Assistance Bed Mobility: Rolling, Sidelying to Sit Rolling: Max assist, +2 for physical assistance Sidelying to sit: Mod assist, +2 for physical assistance, HOB elevated            Transfers Overall transfer level: Needs assistance Equipment used: Rolling walker (2 wheels) Transfers: Sit to/from Stand Sit to Stand: Min assist, +2 physical assistance, From elevated surface           General transfer comment: increased time to power up to standing      Balance Overall balance assessment: Needs assistance Sitting-balance support: Feet supported Sitting balance-Leahy Scale: Good     Standing balance support: Bilateral upper extremity supported, During functional activity, Reliant on assistive device for balance Standing balance-Leahy Scale: Poor Standing balance comment: Reliant on AD and external support                           ADL either performed or assessed with clinical judgement   ADL Overall ADL's : Needs assistance/impaired Eating/Feeding: Independent   Grooming: Set  up;Sitting   Upper Body Bathing: Minimal assistance;Sitting   Lower Body Bathing: Total assistance;+2 for physical assistance;Sit to/from stand   Upper Body Dressing : Minimal assistance;Sitting   Lower Body Dressing: Total assistance;Bed level   Toilet Transfer: +2 for  physical assistance;Minimal assistance;Rolling walker (2 wheels);Ambulation           Functional mobility during ADLs: +2 for physical assistance;Minimal assistance       Vision Baseline Vision/History: 1 Wears glasses Ability to See in Adequate Light: 0 Adequate Patient Visual Report: No change from baseline       Perception     Praxis      Pertinent Vitals/Pain Pain Assessment Pain Assessment: Faces Faces Pain Scale: Hurts a little bit Pain Location: R ribs, 10/10 when coughs Pain Descriptors / Indicators: Sore, Discomfort, Guarding Pain Intervention(s): Monitored during session, Repositioned     Hand Dominance Right   Extremity/Trunk Assessment Upper Extremity Assessment Upper Extremity Assessment: Overall WFL for tasks assessed   Lower Extremity Assessment Lower Extremity Assessment: Defer to PT evaluation   Cervical / Trunk Assessment Cervical / Trunk Assessment: Normal   Communication Communication Communication: No difficulties   Cognition Arousal/Alertness: Awake/alert Behavior During Therapy: Flat affect Overall Cognitive Status: Within Functional Limits for tasks assessed                                       General Comments       Exercises     Shoulder Instructions      Home Living Family/patient expects to be discharged to:: Private residence Living Arrangements: Alone Available Help at Discharge: Family;Available PRN/intermittently Type of Home: House Home Access: Stairs to enter [employer] of Steps: 5 Entrance Stairs-Rails: Right;Left Home Layout: One level     Bathroom Shower/Tub: Producer, television/film/video: Handicapped height     Home Equipment: Cane - single Librarian, academic (2 wheels);Shower seat;Hand held shower head          Prior Functioning/Environment Prior Level of Function : Independent/Modified Independent;Driving             Mobility Comments: walked with cane out of  the home, no AD inside ADLs Comments: sits to shower        OT Problem List: Decreased strength;Decreased activity tolerance;Impaired balance (sitting and/or standing);Decreased knowledge of use of DME or AE;Pain;Obesity      OT Treatment/Interventions: Self-care/ADL training;DME and/or AE instruction;Therapeutic activities;Patient/family education;Balance training    OT Goals(Current goals can be found in the care plan section) Acute Rehab OT Goals OT Goal Formulation: With patient Time For Goal Achievement: 08/23/22 Potential to Achieve Goals: Good ADL Goals Pt Will Perform Grooming: with supervision;standing Pt Will Perform Upper Body Dressing: with set-up;sitting Pt Will Perform Lower Body Dressing: with min assist;with adaptive equipment;sit to/from stand Pt Will Transfer to Toilet: with supervision;ambulating;bedside commode Pt Will Perform Toileting - Clothing Manipulation and hygiene: with supervision;sit to/from stand Additional ADL Goal #1: Pt will perform bed mobility with min assist in preparation for ADLs.  OT Frequency: Min 2X/week    Co-evaluation PT/OT/SLP Co-Evaluation/Treatment: Yes Reason for Co-Treatment: For patient/therapist safety PT goals addressed during session: Mobility/safety with mobility;Balance OT goals addressed during session: ADL's and self-care      AM-PAC OT "6 Clicks" Daily Activity     Outcome Measure Help from another person eating meals?: None Help from another person taking care of personal grooming?:  A Little Help from another person toileting, which includes using toliet, bedpan, or urinal?: A Lot Help from another person bathing (including washing, rinsing, drying)?: A Lot Help from another person to put on and taking off regular upper body clothing?: A Little Help from another person to put on and taking off regular lower body clothing?: Total 6 Click Score: 15   End of Session Equipment Utilized During Treatment: Gait belt;Rolling  walker (2 wheels) Nurse Communication: Mobility status  Activity Tolerance: Patient tolerated treatment well Patient left: in chair;with call bell/phone within reach;with chair alarm set  OT Visit Diagnosis: Unsteadiness on feet (R26.81);Other abnormalities of gait and mobility (R26.89);Pain;Muscle weakness (generalized) (M62.81)                Time: 1610-9604 OT Time Calculation (min): 25 min Charges:  OT General Charges $OT Visit: 1 Visit OT Evaluation $OT Eval Moderate Complexity: 1 Mod  [name], [name] Acute Rehabilitation Services Office: [phone]   [name] 08/09/2022, 1:01 PM

## 2022-08-09 NOTE — Care Management (Signed)
Transition of Care [facility]) - Inpatient Brief Assessment   Patient Details  Name: [name] MRN: [id number] Date of Birth: 06/22/1950  Transition of Care [facility]) CM/SW Contact:    Lawerance Sabal, RN Phone Number: 08/09/2022, 2:37 PM   Clinical Narrative:  Falls at home, progressive weakness, non compliant with Lasix, BLE edema contributing to falls.  CIR following  Transition of Care Asessment: Insurance and Status: Insurance coverage has been reviewed Patient has primary care physician: Yes Home environment has been reviewed: from home alone Prior level of function:: progressive weakness, recent falls at home Prior/Current Home Services: No current home services   Readmission risk has been reviewed: Yes Transition of care needs: no transition of care needs at this time

## 2022-08-10 DIAGNOSIS — I639 Cerebral infarction, unspecified: Secondary | ICD-10-CM | POA: Diagnosis not present

## 2022-08-10 LAB — CBC
HCT: 25.7 % — ABNORMAL LOW (ref 36.0–46.0)
Hemoglobin: 8.6 g/dL — ABNORMAL LOW (ref 12.0–15.0)
MCH: 33 pg (ref 26.0–34.0)
MCHC: 33.5 g/dL (ref 30.0–36.0)
MCV: 98.5 fL (ref 80.0–100.0)
Platelets: 193 10*3/uL (ref 150–400)
RBC: 2.61 MIL/uL — ABNORMAL LOW (ref 3.87–5.11)
RDW: 13.5 % (ref 11.5–15.5)
WBC: 5.9 10*3/uL (ref 4.0–10.5)
nRBC: 0 % (ref 0.0–0.2)

## 2022-08-10 LAB — COMPREHENSIVE METABOLIC PANEL
ALT: 20 U/L (ref 0–44)
AST: 32 U/L (ref 15–41)
Albumin: 2.3 g/dL — ABNORMAL LOW (ref 3.5–5.0)
Alkaline Phosphatase: 30 U/L — ABNORMAL LOW (ref 38–126)
Anion gap: 8 (ref 5–15)
BUN: 33 mg/dL — ABNORMAL HIGH (ref 8–23)
CO2: 20 mmol/L — ABNORMAL LOW (ref 22–32)
Calcium: 8 mg/dL — ABNORMAL LOW (ref 8.9–10.3)
Chloride: 104 mmol/L (ref 98–111)
Creatinine, Ser: 1.55 mg/dL — ABNORMAL HIGH (ref 0.44–1.00)
GFR, Estimated: 36 mL/min — ABNORMAL LOW (ref >60)
Glucose, Bld: 157 mg/dL — ABNORMAL HIGH (ref 70–99)
Potassium: 3.8 mmol/L (ref 3.5–5.1)
Sodium: 132 mmol/L — ABNORMAL LOW (ref 135–145)
Total Bilirubin: 1.3 mg/dL — ABNORMAL HIGH (ref 0.3–1.2)
Total Protein: 5.4 g/dL — ABNORMAL LOW (ref 6.5–8.1)

## 2022-08-10 LAB — GLUCOSE, CAPILLARY
Glucose-Capillary: 148 mg/dL — ABNORMAL HIGH (ref 70–99)
Glucose-Capillary: 265 mg/dL — ABNORMAL HIGH (ref 70–99)
Glucose-Capillary: 228 mg/dL — ABNORMAL HIGH (ref 70–99)
Glucose-Capillary: 162 mg/dL — ABNORMAL HIGH (ref 70–99)

## 2022-08-10 LAB — URINE CULTURE

## 2022-08-10 MED ORDER — INSULIN GLARGINE-YFGN 100 UNIT/ML ~~LOC~~ SOLN
15.0000 [IU] | Freq: Every day | SUBCUTANEOUS | Status: DC
  Administered 2022-08-10 – 2022-08-11 (×2): 15 [IU] via SUBCUTANEOUS
  Filled 2022-08-10 (×3): qty 0.15

## 2022-08-10 NOTE — Progress Notes (Addendum)
  Inpatient Rehabilitation Admissions Coordinator   Met with patient  at bedside for rehab assessment. We discussed goals and expectations of a possible CIR admit. We also discussed caregiver supports after Cir that would be needed. She gave me permission to contact her sister which I did. I will meet with patient and her sister at 59 today to further discuss rehab venue options.Please call  me with any questions.   Ottie Glazier, RN, MSN Rehab Admissions Coordinator [phone]    I met with patient , sister and brother in law. Patient will discharge to sister's home who can provide 24/7 assist at d/c . They prefer Cir admit. I will begin Auth with [employer] team advantage.  Ottie Glazier, RN, MSN Rehab Admissions Coordinator [phone] 08/10/2022 2:56 PM

## 2022-08-10 NOTE — Progress Notes (Signed)
PROGRESS NOTE    [name]  ZHY:[id number] DOB: 31-Jul-1950 DOA: 08/08/2022 PCP: Laurann Montana, MD   Brief Narrative: [name] is a 72 y.o. female with a history of ductal carcinoma in situ of left breast, diabetes mellitus type 2, obesity, major depressive disorder, hypertension, peripheral neuropathy, retinopathy.  Patient presented secondary to progressive weakness with multiple falls.  Found to have evidence of acute multifocal strokes involving the right MCA territory and left subcortical lacunar areas.   Assessment and Plan:  Acute cryptogenic stroke MRI brain confirms multifocal bilateral acute ischemia affecting the deep gray matter in addition to evidence of 2 cortical foci noted within the right MCA territory. MRA head normal. LDL of 58. Hemoglobin A1C of 7.0%. Transthoracic Echocardiogram significant for normal LVEF of 60 to 65% with mild to moderately dilated left atrium and no atrial level shunt detected.  Per neurology, cryptogenic embolism and small vessel disease or etiology for stroke.  Neurology recommendations for 30-day heart monitor at discharge to evaluate for paroxysmal atrial fibrillation, Plavix for 3 weeks and aspirin indefinitely with repeat carotid ultrasound in 6 to 12 months. PT/OT recommendations for acute inpatient rehabilitation. Recommendation for Neurology follow-up in 4 weeks.  Left carotid artery stenosis Mild.  Neurology recommendations for surveillance carotid ultrasound in 6 to 12 months.  UTI Urine culture significant for E. coli with susceptibilities pending. Patient started on ceftriaxone. -Continue ceftriaxone IV -Follow-up urine culture sensitivities  AKI on CKD stage IIIa Unclear baseline, although most recent creatinine of 1.42 earlier this month. Creatinine of 2.29 on admission and is improving with IV fluids. -Continue IV fluids -Since renal function is improving, will continue losartan for now -Will obtain recent labs from  outpatient PCP's office in AM  Diabetes mellitus type 2 Controlled with recent hemoglobin A1C of 7.0%. Patient is managed with NPH 70/30 24 units in the morning and 20 units in the evening as an outpatient. Patient started on SSI on admission. -Continue SSI -Start Semglee 15 units qHS  Rib fracture Secondary to fall involving the eighth rib. -Continue incentive spirometer and supportive care  Primary hypertension -Continue Coreg and losartan  Acute anemia Unclear etiology. Hemoglobin of 12.1 on admission with acute drop of hemoglobin to 9.1 and now 8.6. No hemorrhaging noted. Possibly related to hemoconcentration as WBC and platelets have also dropped accordingly. -Repeat CBC in AM -FOBT -Will obtain recent labs from outpatient PCP's office in AM  Pressure injury Right/left buttocks. Present on admission.   DVT prophylaxis: Lovenox Code Status:   Code Status: Full Code Family Communication: None at bedside Disposition Plan: Discharge to CIR is able. Medically stable for discharge.   Consultants:  Neurology  Procedures:  5/21: Transthoracic Echocardiogram 5/21: Carotid ultrasound  Antimicrobials: Ceftriaxone IV    Subjective: Patient reports no evidence of bleeding. Bowel movement yesterday that was green without notice of melena or hematochezia.  Objective: BP (!) 189/61 (BP Location: Right Arm)   Pulse 71   Temp 97.9 F (36.6 C) (Oral)   Resp 18   Ht 5' 9.5" (1.765 m)   Wt 106.6 kg   SpO2 97%   BMI 34.21 kg/m   Examination:  General exam: Appears calm and comfortable Respiratory system: Clear to auscultation. Respiratory effort normal. Cardiovascular system: S1 & S2 heard, RRR. No murmurs, rubs, gallops or clicks. Gastrointestinal system: Abdomen is nondistended, soft and nontender. No organomegaly or masses felt. Normal bowel sounds heard. Central nervous system: Alert and oriented. 5/5 bilateral upper extremity strength.  Right LE with 4/5 strength and  left LE with 3/5 strength Musculoskeletal: No edema. No calf tenderness Skin: No cyanosis. No rashes   Data Reviewed: I have personally reviewed following labs and imaging studies  CBC Lab Results  Component Value Date   WBC 5.9 08/10/2022   RBC 2.61 (L) 08/10/2022   HGB 8.6 (L) 08/10/2022   HCT 25.7 (L) 08/10/2022   MCV 98.5 08/10/2022   MCH 33.0 08/10/2022   PLT 193 08/10/2022   MCHC 33.5 08/10/2022   RDW 13.5 08/10/2022   LYMPHSABS 2.1 08/08/2022   MONOABS 1.0 08/08/2022   EOSABS 0.0 08/08/2022   BASOSABS 0.0 08/08/2022     Last metabolic panel Lab Results  Component Value Date   NA 132 (L) 08/10/2022   K 3.8 08/10/2022   CL 104 08/10/2022   CO2 20 (L) 08/10/2022   BUN 33 (H) 08/10/2022   CREATININE 1.55 (H) 08/10/2022   GLUCOSE 157 (H) 08/10/2022   GFRNONAA 36 (L) 08/10/2022   GFRAA 81 01/03/2017   CALCIUM 8.0 (L) 08/10/2022   PHOS 3.9 08/09/2022   PROT 5.4 (L) 08/10/2022   ALBUMIN 2.3 (L) 08/10/2022   BILITOT 1.3 (H) 08/10/2022   ALKPHOS 30 (L) 08/10/2022   AST 32 08/10/2022   ALT 20 08/10/2022   ANIONGAP 8 08/10/2022    GFR: Estimated Creatinine Clearance: 43.7 mL/min (A) (by C-G formula based on SCr of 1.55 mg/dL (H)).  Recent Results (from the past 240 hour(s))  Urine Culture     Status: Abnormal (Preliminary result)   Collection Time: 08/09/22  1:30 AM   Specimen: Urine, Clean Catch  Result Value Ref Range Status   Specimen Description URINE, CLEAN CATCH  Final   Special Requests NONE  Final   Culture (A)  Final    >=100,000 COLONIES/mL GRAM NEGATIVE RODS SUSCEPTIBILITIES TO FOLLOW Performed at [facility], [address], [place], [place] [address]    Report Status PENDING  Incomplete      Radiology Studies: VAS US CAROTID  Result Date: 08/09/2022 Carotid Arterial Duplex Study Patient Name:  [name]   Date of Exam:   08/09/2022 Medical Rec #: [id number]        Accession #:    [id number] Date of Birth: September 09, 1950         Patient Gender: F Patient Age:   44 years Exam Location:  [facility] Procedure:      VAS US CAROTID Referring Phys: Dow Adolph --------------------------------------------------------------------------------  Indications:       CVA and Weakness. Risk Factors:      Hypertension, hyperlipidemia, Diabetes. Comparison Study:  No previous study. Performing Technologist: McKayla Maag RVT, [place]  Examination Guidelines: A complete evaluation includes B-mode imaging, spectral Doppler, color Doppler, and power Doppler as needed of all accessible portions of each vessel. Bilateral testing is considered an integral part of a complete examination. Limited examinations for reoccurring indications may be performed as noted.  Right Carotid Findings: +----------+--------+--------+--------+---------------------+------------------+           PSV cm/sEDV cm/sStenosisPlaque Description   Comments           +----------+--------+--------+--------+---------------------+------------------+ CCA Prox  78      9                                                       +----------+--------+--------+--------+---------------------+------------------+  CCA Distal64      7                                                       +----------+--------+--------+--------+---------------------+------------------+ ICA Prox  106     17      1-39%   homogeneous,         intimal thickening                                   irregular and                                                             calcific                                +----------+--------+--------+--------+---------------------+------------------+ ICA Mid   97      19                                                      +----------+--------+--------+--------+---------------------+------------------+ ICA Distal127     21                                                       +----------+--------+--------+--------+---------------------+------------------+ ECA       193     19                                                      +----------+--------+--------+--------+---------------------+------------------+ +----------+--------+-------+--------+-------------------+           PSV cm/sEDV cmsDescribeArm Pressure (mmHG) +----------+--------+-------+--------+-------------------+ [id number]            Stenotic                    +----------+--------+-------+--------+-------------------+ +---------+--------+--+--------+-+---------+ VertebralPSV cm/s32EDV cm/s7Antegrade +---------+--------+--+--------+-+---------+  Left Carotid Findings: +----------+--------+--------+--------+---------------------+------------------+           PSV cm/sEDV cm/sStenosisPlaque Description   Comments           +----------+--------+--------+--------+---------------------+------------------+ CCA Prox  107     9                                                       +----------+--------+--------+--------+---------------------+------------------+ CCA Distal69      13  intimal thickening +----------+--------+--------+--------+---------------------+------------------+ ICA Prox  129     18      40-59%  homogeneous,                                                              irregular and                                                             calcific                                +----------+--------+--------+--------+---------------------+------------------+ ICA Mid   120     13                                                      +----------+--------+--------+--------+---------------------+------------------+ ICA Distal114     22                                                      +----------+--------+--------+--------+---------------------+------------------+ ECA       107                                                              +----------+--------+--------+--------+---------------------+------------------+ +----------+--------+--------+----------------+-------------------+           PSV cm/sEDV cm/sDescribe        Arm Pressure (mmHG) +----------+--------+--------+----------------+-------------------+ [id number]             Multiphasic, WNL                    +----------+--------+--------+----------------+-------------------+ +---------+--------+--+--------+-+---------+ VertebralPSV cm/s49EDV cm/s9Antegrade +---------+--------+--+--------+-+---------+   Summary: Right Carotid: Velocities in the right ICA are consistent with a 1-39% stenosis. Left Carotid: Velocities in the left ICA are consistent with a 40-59% stenosis. Vertebrals:  Bilateral vertebral arteries demonstrate antegrade flow. Subclavians: Right subclavian artery was stenotic. Normal flow hemodynamics were              seen in the left subclavian artery. *See table(s) above for measurements and observations.  Electronically signed by Coral Else MD on 08/09/2022 at 7:15:45 PM.    Final    ECHOCARDIOGRAM COMPLETE BUBBLE STUDY  Result Date: 08/09/2022    ECHOCARDIOGRAM REPORT   Patient Name:   [name] Date of Exam: 08/09/2022 Medical Rec #:  [id number]       Height:       69.5 in Accession #:    [id number]      Weight:       235.0 lb Date of Birth:  October 21, 1950  BSA:          2.224 m Patient Age:    72 years        BP:           182/55 mmHg Patient Gender: F               HR:           80 bpm. Exam Location:  Inpatient Procedure: 2D Echo, Cardiac Doppler, Color Doppler and Saline Contrast Bubble            Study Indications:    stroke  History:        Patient has no prior history of Echocardiogram examinations.                 Stroke; Risk Factors:Hypertension, Diabetes and Dyslipidemia.  Sonographer:    Mike Gip Referring Phys: [id number] CAROLE N HALL IMPRESSIONS  1. Left ventricular ejection  fraction, by estimation, is 60 to 65%. The left ventricle has normal function. The left ventricle has no regional wall motion abnormalities. There is mild concentric left ventricular hypertrophy. Left ventricular diastolic parameters are consistent with Grade I diastolic dysfunction (impaired relaxation).  2. Right ventricular systolic function is normal. The right ventricular size is normal. Tricuspid regurgitation signal is inadequate for assessing PA pressure.  3. Left atrial size was mild to moderately dilated.  4. The mitral valve is normal in structure. No evidence of mitral valve regurgitation. No evidence of mitral stenosis.  5. The aortic valve is tricuspid. There is mild thickening of the aortic valve. Aortic valve regurgitation is not visualized. Aortic valve sclerosis is present, with no evidence of aortic valve stenosis.  6. The inferior vena cava is normal in size with greater than 50% respiratory variability, suggesting right atrial pressure of 3 mmHg.  7. Agitated saline contrast bubble study was negative, with no evidence of any interatrial shunt. FINDINGS  Left Ventricle: Left ventricular ejection fraction, by estimation, is 60 to 65%. The left ventricle has normal function. The left ventricle has no regional wall motion abnormalities. The left ventricular internal cavity size was normal in size. There is  mild concentric left ventricular hypertrophy. Left ventricular diastolic parameters are consistent with Grade I diastolic dysfunction (impaired relaxation). Indeterminate filling pressures. Right Ventricle: The right ventricular size is normal. No increase in right ventricular wall thickness. Right ventricular systolic function is normal. Tricuspid regurgitation signal is inadequate for assessing PA pressure. The tricuspid regurgitant velocity is 2.58 m/s, and with an assumed right atrial pressure of 3 mmHg, the estimated right ventricular systolic pressure is 29.6 mmHg. Left Atrium: Left atrial  size was mild to moderately dilated. Right Atrium: Right atrial size was normal in size. Pericardium: There is no evidence of pericardial effusion. Mitral Valve: The mitral valve is normal in structure. Mild mitral annular calcification. No evidence of mitral valve regurgitation. No evidence of mitral valve stenosis. Tricuspid Valve: The tricuspid valve is normal in structure. Tricuspid valve regurgitation is trivial. No evidence of tricuspid stenosis. Aortic Valve: The aortic valve is tricuspid. There is mild thickening of the aortic valve. Aortic valve regurgitation is not visualized. Aortic valve sclerosis is present, with no evidence of aortic valve stenosis. Pulmonic Valve: The pulmonic valve was grossly normal. Pulmonic valve regurgitation is not visualized. No evidence of pulmonic stenosis. Aorta: The aortic root and ascending aorta are structurally normal, with no evidence of dilitation. Venous: The inferior vena cava is normal in size with greater than 50% respiratory variability,  suggesting right atrial pressure of 3 mmHg. IAS/Shunts: No atrial level shunt detected by color flow Doppler. Agitated saline contrast was given intravenously to evaluate for intracardiac shunting. Agitated saline contrast bubble study was negative, with no evidence of any interatrial shunt.  LEFT VENTRICLE PLAX 2D LVIDd:         4.40 cm      Diastology LVIDs:         3.10 cm      LV e' medial:    6.20 cm/s LV PW:         1.30 cm      LV E/e' medial:  12.5 LV IVS:        1.30 cm      LV e' lateral:   7.72 cm/s LVOT diam:     2.10 cm      LV E/e' lateral: 10.1 LV SV:         99 LV SV Index:   44 LVOT Area:     3.46 cm  LV Volumes (MOD) LV vol d, MOD A2C: 120.0 ml LV vol d, MOD A4C: 126.0 ml LV vol s, MOD A2C: 51.4 ml LV vol s, MOD A4C: 48.0 ml LV SV MOD A2C:     68.6 ml LV SV MOD A4C:     126.0 ml LV SV MOD BP:      73.9 ml RIGHT VENTRICLE             IVC RV Basal diam:  3.70 cm     IVC diam: 2.00 cm RV S prime:     22.80 cm/s  TAPSE (M-mode): 3.1 cm LEFT ATRIUM             Index        RIGHT ATRIUM           Index LA diam:        4.40 cm 1.98 cm/m   RA Area:     17.30 cm LA Vol (A2C):   75.7 ml 34.04 ml/m  RA Volume:   43.90 ml  19.74 ml/m LA Vol (A4C):   68.0 ml 30.58 ml/m LA Biplane Vol: 74.0 ml 33.28 ml/m  AORTIC VALVE LVOT Vmax:   112.00 cm/s LVOT Vmean:  78.900 cm/s LVOT VTI:    0.285 m  AORTA Ao Root diam: 3.20 cm Ao Asc diam:  3.50 cm MITRAL VALVE                TRICUSPID VALVE MV Area (PHT): 2.56 cm     TR Peak grad:   26.6 mmHg MV E velocity: 77.80 cm/s   TR Vmax:        258.00 cm/s MV A velocity: 117.00 cm/s MV E/A ratio:  0.66         SHUNTS                             Systemic VTI:  0.29 m                             Systemic Diam: 2.10 cm Rachelle Hora Croitoru MD Electronically signed by Thurmon Fair MD Signature Date/Time: 08/09/2022/1:54:07 PM    Final    MR ANGIO HEAD WO CONTRAST  Result Date: 08/09/2022 CLINICAL DATA:  Stroke follow-up EXAM: MRA HEAD WITHOUT CONTRAST TECHNIQUE: Angiographic images of the Circle of Willis were acquired using MRA technique without intravenous contrast.  COMPARISON:  None Available. FINDINGS: POSTERIOR CIRCULATION: --Vertebral arteries: Normal --Inferior cerebellar arteries: Normal. --Basilar artery: Normal. --Superior cerebellar arteries: Normal. --Posterior cerebral arteries: Normal. ANTERIOR CIRCULATION: --Intracranial internal carotid arteries: Normal. --Anterior cerebral arteries (ACA): Normal. --Middle cerebral arteries (MCA): Normal. Anatomic variants: None Other: None. IMPRESSION: Normal intracranial MRA. Electronically Signed   By: Deatra Robinson M.D.   On: 08/09/2022 01:06   MR BRAIN WO CONTRAST  Result Date: 08/08/2022 CLINICAL DATA:  Altered mental status EXAM: MRI HEAD WITHOUT CONTRAST TECHNIQUE: Multiplanar, multiecho pulse sequences of the brain and surrounding structures were obtained without intravenous contrast. COMPARISON:  06/05/2019 FINDINGS: Brain: Multifocal  bilateral acute ischemia, predominantly affecting the deep gray matter. There also 2 cortical foci within the right MCA territory. There are old bilateral MCA territory infarcts. No acute or chronic hemorrhage. There is multifocal hyperintense T2-weighted signal within the white matter. Generalized volume loss. The midline structures are normal. Vascular: Major flow voids are preserved. Skull and upper cervical spine: Normal calvarium and skull base. Visualized upper cervical spine and soft tissues are normal. Sinuses/Orbits:No paranasal sinus fluid levels or advanced mucosal thickening. No mastoid or middle ear effusion. Normal orbits. IMPRESSION: 1. Multifocal bilateral acute ischemia, predominantly affecting the deep gray matter. There also 2 cortical foci within the right MCA territory. No hemorrhage or mass effect. 2. Old bilateral MCA territory infarcts. 3. Mild volume loss and chronic ischemic microangiopathy. Electronically Signed   By: Deatra Robinson M.D.   On: 08/08/2022 21:34   DG Ribs Unilateral W/Chest Right  Result Date: 08/08/2022 CLINICAL DATA:  De Hollingshead 3 times in the past week with right lower anterior rib pain EXAM: RIGHT RIBS AND CHEST - 3+ VIEW COMPARISON:  06/04/2010 FINDINGS: Suspected acute fracture of the anterior right eighth rib. There is no evidence of pneumothorax or pleural effusion. Both lungs are clear. Stable cardiomediastinal silhouette. Aortic atherosclerotic calcification. Cervical spine fusion hardware IMPRESSION: Suspected acute fracture of the anterior right eighth rib. Electronically Signed   By: Minerva Fester M.D.   On: 08/08/2022 20:14   CT Head Wo Contrast  Result Date: 08/08/2022 CLINICAL DATA:  Provided history: Polytrauma, blunt. Additional history provided: Fall. Patient found down. EXAM: CT HEAD WITHOUT CONTRAST CT CERVICAL SPINE WITHOUT CONTRAST TECHNIQUE: Multidetector CT imaging of the head and cervical spine was performed following the standard protocol  without intravenous contrast. Multiplanar CT image reconstructions of the cervical spine were also generated. RADIATION DOSE REDUCTION: This exam was performed according to the departmental dose-optimization program which includes automated exposure control, adjustment of the mA and/or kV according to patient size and/or use of iterative reconstruction technique. COMPARISON:  Brain MRI 06/05/2019.  Head CT 05/14/2019. FINDINGS: CT HEAD FINDINGS Brain: Generalized cerebral atrophy. Redemonstrated chronic cortically-based infarcts within the bilateral frontal lobes and left parietal lobe. 16 x 5 mm age-indeterminate infarct centered within the left corona radiata and caudate nucleus, new from the prior brain MRI of 06/05/2019 but otherwise age indeterminate. Background mild patchy and ill-defined hypoattenuation within the cerebral white matter, nonspecific but compatible with chronic small ischemic disease. There is no acute intracranial hemorrhage. No extra-axial fluid collection. No evidence of an intracranial mass. No midline shift. Vascular: No hyperdense vessel.  Atherosclerotic calcifications. Skull: No fracture or aggressive osseous lesion. Sinuses/Orbits: No mass or acute finding within the imaged orbits. No significant paranasal sinus disease. CT CERVICAL SPINE FINDINGS Alignment: No significant spondylolisthesis. Skull base and vertebrae: The basion-dental and atlanto-dental intervals are maintained.No evidence of acute fracture to the cervical spine.  Soft tissues and spinal canal: No prevertebral fluid or swelling. No visible canal hematoma. Disc levels: Prior C5-C6 ACDF. Solid arthrodesis across the C5-C6 disc space. No evidence of acute hardware compromise. Background cervical spondylosis with multilevel disc space narrowing and disc bulges/central disc protrusions. Disc space narrowing is greatest at C4-C5 (moderately advanced at this level). No appreciable high-grade spinal canal stenosis. No  significant bony neural foraminal narrowing. Upper chest: No consolidation within the imaged lung apices. No visible pneumothorax. IMPRESSION: CT head: 1. 16 x 5 mm infarct within the left corona radiata/caudate nucleus, new from the prior brain MRI of 06/05/2019 but otherwise age indeterminate. Consider a brain MRI for further evaluation. 2. No acute posttraumatic intracranial findings. 3. Redemonstrated chronic cortically-based infarcts within bilateral frontal and left parietal lobes. 4. Background mild cerebral white matter chronic small vessel ischemic disease. 5. Generalized cerebral atrophy. CT cervical spine: 1. No evidence of acute fracture to the cervical spine. 2. Prior C5-C6 ACDF. 3. Cervical spondylosis as described. Electronically Signed   By: Jackey Loge D.O.   On: 08/08/2022 14:05   CT Cervical Spine Wo Contrast  Result Date: 08/08/2022 CLINICAL DATA:  Provided history: Polytrauma, blunt. Additional history provided: Fall. Patient found down. EXAM: CT HEAD WITHOUT CONTRAST CT CERVICAL SPINE WITHOUT CONTRAST TECHNIQUE: Multidetector CT imaging of the head and cervical spine was performed following the standard protocol without intravenous contrast. Multiplanar CT image reconstructions of the cervical spine were also generated. RADIATION DOSE REDUCTION: This exam was performed according to the departmental dose-optimization program which includes automated exposure control, adjustment of the mA and/or kV according to patient size and/or use of iterative reconstruction technique. COMPARISON:  Brain MRI 06/05/2019.  Head CT 05/14/2019. FINDINGS: CT HEAD FINDINGS Brain: Generalized cerebral atrophy. Redemonstrated chronic cortically-based infarcts within the bilateral frontal lobes and left parietal lobe. 16 x 5 mm age-indeterminate infarct centered within the left corona radiata and caudate nucleus, new from the prior brain MRI of 06/05/2019 but otherwise age indeterminate. Background mild patchy  and ill-defined hypoattenuation within the cerebral white matter, nonspecific but compatible with chronic small ischemic disease. There is no acute intracranial hemorrhage. No extra-axial fluid collection. No evidence of an intracranial mass. No midline shift. Vascular: No hyperdense vessel.  Atherosclerotic calcifications. Skull: No fracture or aggressive osseous lesion. Sinuses/Orbits: No mass or acute finding within the imaged orbits. No significant paranasal sinus disease. CT CERVICAL SPINE FINDINGS Alignment: No significant spondylolisthesis. Skull base and vertebrae: The basion-dental and atlanto-dental intervals are maintained.No evidence of acute fracture to the cervical spine. Soft tissues and spinal canal: No prevertebral fluid or swelling. No visible canal hematoma. Disc levels: Prior C5-C6 ACDF. Solid arthrodesis across the C5-C6 disc space. No evidence of acute hardware compromise. Background cervical spondylosis with multilevel disc space narrowing and disc bulges/central disc protrusions. Disc space narrowing is greatest at C4-C5 (moderately advanced at this level). No appreciable high-grade spinal canal stenosis. No significant bony neural foraminal narrowing. Upper chest: No consolidation within the imaged lung apices. No visible pneumothorax. IMPRESSION: CT head: 1. 16 x 5 mm infarct within the left corona radiata/caudate nucleus, new from the prior brain MRI of 06/05/2019 but otherwise age indeterminate. Consider a brain MRI for further evaluation. 2. No acute posttraumatic intracranial findings. 3. Redemonstrated chronic cortically-based infarcts within bilateral frontal and left parietal lobes. 4. Background mild cerebral white matter chronic small vessel ischemic disease. 5. Generalized cerebral atrophy. CT cervical spine: 1. No evidence of acute fracture to the cervical spine. 2. Prior C5-C6  ACDF. 3. Cervical spondylosis as described. Electronically Signed   By: Jackey Loge D.O.   On:  08/08/2022 14:05      LOS: 2 days    Jacquelin Hawking, MD [facility] 08/10/2022, 10:02 AM   If 7PM-7AM, please contact night-coverage [email or web address]

## 2022-08-10 NOTE — Progress Notes (Signed)
Physical Therapy Treatment Patient Details Name: [name] MRN: [id number] DOB: [birth year] Today's Date: 08/10/2022   History of Present Illness Pt is a 72 y.o. female who presented 08/08/22  with progressive weakness and recurrent falls. MRI brain w/o c demonstrates multifocal bilateral acute ischemia, predominantly affecting the deep gray matter. There also 2 cortical foci within the right MCA territory. No hemorrhage or mass effect. Patient also has R rib fx. PMH: ductal carcinoma in situ of left breast, DM2, obesity, CKD, HLD, HTN, OSA    PT Comments    Pt is making great progress towards her PT goals, now only needing minA for bed mobility, transfers, and to ambulate short distances in the room with the RW. She is at high risk for falls though, displaying increased shakiness and flexed posture as time in standing, and thus her fatigue, increases. She displays deficits in [place] strength, activity tolerance, static and dynamic balance, and muscular power. As she is progressing quickly, she would be a great candidate for intensive inpatient rehab, >3 hours/day. Will continue to follow acutely.     Recommendations for follow up therapy are one component of a multi-disciplinary discharge planning process, led by the attending physician.  Recommendations may be updated based on patient status, additional functional criteria and insurance authorization.  Follow Up Recommendations       Assistance Recommended at Discharge Frequent or constant Supervision/Assistance  Patient can return home with the following A lot of help with bathing/dressing/bathroom;Help with stairs or ramp for entrance;Assist for transportation;Assistance with cooking/housework;A little help with walking and/or transfers;Direct supervision/assist for medications management;Direct supervision/assist for financial management   Equipment Recommendations  None recommended by PT    Recommendations for Other Services        Precautions / Restrictions Precautions Precautions: Fall Restrictions Weight Bearing Restrictions: No     Mobility  Bed Mobility Overal bed mobility: Needs Assistance Bed Mobility: Rolling, Sidelying to Sit Rolling: Min assist Sidelying to sit: HOB elevated, Min assist       General bed mobility comments: Cues to roll to L to reduce R rib pain, needing several minutes and verbal and tactile cues to coordinate the roll, minA to complete. MinA to ascend trunk to sit up L EOB using bed rails    Transfers Overall transfer level: Needs assistance Equipment used: Rolling walker (2 wheels) Transfers: Sit to/from Stand Sit to Stand: Min assist           General transfer comment: Cues for hand placement with transfers, minA to power up and gain balance standing from EOB 1x, toilet 1x, and bedside commode 1x    Ambulation/Gait Ambulation/Gait assistance: Min assist Gait Distance (Feet): 20 Feet (x3 bouts of ~20 ft > ~8 ft > ~12 ft) Assistive device: Rolling walker (2 wheels) Gait Pattern/deviations: Decreased step length - right, Decreased step length - left, Decreased stride length, Step-through pattern, Decreased dorsiflexion - right, Decreased dorsiflexion - left, Shuffle, Trunk flexed, Knee flexed in stance - left, Knee flexed in stance - right Gait velocity: decr Gait velocity interpretation: <1.31 ft/sec, indicative of household ambulator   General Gait Details: Pt with flexed posture, which increased along with her shakiness as she fatigued. Cues provided for feet clearance bil and upright posture. MinA for balance and cuing to sit to rest as needed.   Stairs             Wheelchair Mobility    Modified Rankin (Stroke Patients Only) Modified Rankin (Stroke Patients Only) Pre-Morbid Rankin  Score: Slight disability Modified Rankin: Moderately severe disability     Balance Overall balance assessment: Needs assistance Sitting-balance support: No upper extremity  supported, Feet supported Sitting balance-Leahy Scale: Good     Standing balance support: Bilateral upper extremity supported, During functional activity, Single extremity supported Standing balance-Leahy Scale: Poor Standing balance comment: Reliant on at least 1 UE for support to wash hands at sink, RW to ambulate. Increased flexion of knees and trunk as time in standing and pt fatigue increased.                            Cognition Arousal/Alertness: Awake/alert Behavior During Therapy: Flat affect                                   General Comments: Pt seeming to get frustrated at saying the wrong phrase intermittently        Exercises      General Comments General comments (skin integrity, edema, etc.): encouraged use of IS      Pertinent Vitals/Pain Pain Assessment Pain Assessment: Faces Faces Pain Scale: Hurts little more Pain Location: R ribs Pain Descriptors / Indicators: Sore, Discomfort, Guarding Pain Intervention(s): Monitored during session, Limited activity within patient's tolerance, Repositioned    Home Living                          Prior Function            PT Goals (current goals can now be found in the care plan section) Acute Rehab PT Goals Patient Stated Goal: become more independent PT Goal Formulation: With patient/family Time For Goal Achievement: 08/19/22 Potential to Achieve Goals: Fair Progress towards PT goals: Progressing toward goals    Frequency    Min 4X/week      PT Plan Current plan remains appropriate    Co-evaluation              AM-PAC PT "6 Clicks" Mobility   Outcome Measure  Help needed turning from your back to your side while in a flat bed without using bedrails?: A Little Help needed moving from lying on your back to sitting on the side of a flat bed without using bedrails?: A Little Help needed moving to and from a bed to a chair (including a wheelchair)?: A  Little Help needed standing up from a chair using your arms (e.g., wheelchair or bedside chair)?: A Little Help needed to walk in hospital room?: A Little Help needed climbing 3-5 steps with a railing? : A Lot 6 Click Score: 17    End of Session Equipment Utilized During Treatment: Gait belt Activity Tolerance: Patient tolerated treatment well Patient left: in chair;with call bell/phone within reach;with chair alarm set;with family/visitor present Nurse Communication: Mobility status PT Visit Diagnosis: Other abnormalities of gait and mobility (R26.89);Repeated falls (R29.6);Muscle weakness (generalized) (M62.81);Difficulty in walking, not elsewhere classified (R26.2);Pain;Unsteadiness on feet (R26.81);Other symptoms and signs involving the nervous system (R29.898) Pain - Right/Left: Right Pain - part of body:  (ribs)     Time: 2725-3664 PT Time Calculation (min) (ACUTE ONLY): 35 min  Charges:  $Gait Training: 8-22 mins $Therapeutic Activity: 8-22 mins                     Raymond Gurney, PT, DPT Acute Rehabilitation Services  Office: [phone]  [name] 08/10/2022, 5:45 PM

## 2022-08-10 NOTE — Hospital Course (Addendum)
[name] is a 72 y.o. female with a history of ductal carcinoma in situ of left breast, diabetes mellitus type 2, obesity, major depressive disorder, hypertension, peripheral neuropathy, retinopathy.  Patient presented secondary to progressive weakness with multiple falls.  Found to have evidence of acute multifocal strokes involving the right MCA territory and left subcortical lacunar areas. Hospitalization complicated by AKI which improved with IV fluids.

## 2022-08-11 ENCOUNTER — Encounter: Payer: PPO | Admitting: Podiatry

## 2022-08-11 DIAGNOSIS — I639 Cerebral infarction, unspecified: Secondary | ICD-10-CM | POA: Diagnosis not present

## 2022-08-11 LAB — GLUCOSE, CAPILLARY
Glucose-Capillary: 104 mg/dL — ABNORMAL HIGH (ref 70–99)
Glucose-Capillary: 198 mg/dL — ABNORMAL HIGH (ref 70–99)
Glucose-Capillary: 197 mg/dL — ABNORMAL HIGH (ref 70–99)
Glucose-Capillary: 219 mg/dL — ABNORMAL HIGH (ref 70–99)

## 2022-08-11 LAB — BASIC METABOLIC PANEL
Anion gap: 8 (ref 5–15)
BUN: 27 mg/dL — ABNORMAL HIGH (ref 8–23)
CO2: 23 mmol/L (ref 22–32)
Calcium: 8.1 mg/dL — ABNORMAL LOW (ref 8.9–10.3)
Chloride: 104 mmol/L (ref 98–111)
Creatinine, Ser: 1.4 mg/dL — ABNORMAL HIGH (ref 0.44–1.00)
GFR, Estimated: 40 mL/min — ABNORMAL LOW (ref >60)
Glucose, Bld: 133 mg/dL — ABNORMAL HIGH (ref 70–99)
Potassium: 3.4 mmol/L — ABNORMAL LOW (ref 3.5–5.1)
Sodium: 135 mmol/L (ref 135–145)

## 2022-08-11 LAB — CBC
HCT: 24.6 % — ABNORMAL LOW (ref 36.0–46.0)
Hemoglobin: 8.3 g/dL — ABNORMAL LOW (ref 12.0–15.0)
MCH: 32.8 pg (ref 26.0–34.0)
MCHC: 33.7 g/dL (ref 30.0–36.0)
MCV: 97.2 fL (ref 80.0–100.0)
Platelets: 194 10*3/uL (ref 150–400)
RBC: 2.53 MIL/uL — ABNORMAL LOW (ref 3.87–5.11)
RDW: 13.3 % (ref 11.5–15.5)
WBC: 5.5 10*3/uL (ref 4.0–10.5)
nRBC: 0 % (ref 0.0–0.2)

## 2022-08-11 LAB — URINE CULTURE: Culture: >=100000 — AB

## 2022-08-11 MED ORDER — ENOXAPARIN SODIUM 40 MG/0.4ML IJ SOSY
40.0000 mg | PREFILLED_SYRINGE | INTRAMUSCULAR | Status: DC
  Administered 2022-08-12: 40 mg via SUBCUTANEOUS
  Filled 2022-08-11: qty 0.4

## 2022-08-11 MED ORDER — POTASSIUM CHLORIDE CRYS ER 20 MEQ PO TBCR
40.0000 meq | EXTENDED_RELEASE_TABLET | Freq: Once | ORAL | Status: AC
  Administered 2022-08-11: 40 meq via ORAL
  Filled 2022-08-11: qty 2

## 2022-08-11 MED ORDER — MUPIROCIN CALCIUM 2 % EX CREA
TOPICAL_CREAM | Freq: Every day | CUTANEOUS | Status: DC
  Filled 2022-08-11: qty 15

## 2022-08-11 NOTE — PMR Pre-admission (Signed)
PMR Admission Coordinator Pre-Admission Assessment  Patient: [name] is an 72 y.o., female MRN: [id number] DOB: 12-05-1950 Height: 5' 9.5" (176.5 cm) Weight: 108.2 kg              Insurance Information HMO:     PPO: yes     PCP:      IPA:      80/20:      OTHER:  PRIMARY: [employer]      Policy#: [id number]      Subscriber: pt CM Name: Junious Dresser      Phone#: [phone]     Fax#: [phone] Pre-Cert#: [id number] approved for 7 days     Employer:  Benefits:  Phone #: [phone]     Name: 5/22 Eff. Date: 03/21/22     Deduct: none      Out of Pocket Max: $3200  CIR: $295 co pay per day days 1 until 6      SNF: no co pay per day days 1 until 20; $203 co co pay per day days 21 until 100 Outpatient: $15 per visit     Co-Pay: visits per medical neccesity Home Health: 100%      Co-Pay: visits per medical neccesity DME: 80%     Co-Pay: 20% Providers: in network  SECONDARY: none  Financial Counselor:       Phone#:   The Data processing manager" for patients in Inpatient Rehabilitation Facilities with attached "Privacy Act Statement-Health Care Records" was provided and verbally reviewed with: Patient  Emergency Contact Information Contact Information     Name Relation Home Work Mobile   [place] Sister   [phone]      Current Medical History  Patient Admitting Diagnosis: CVA  History of Present Illness: 72 year old right-handed female with a 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  Presented 08/08/2022 with progressive weakness and recurrent falls.  Per her sister after most recent fall patient sat on the floor for almost 10 hours and could not get up without assistance and was found covered in urine and feces..    Cranial CT scan showed a 16 x 5  mm infarct within the left corona radiata/caudate nucleus, new from prior brain MRI 06/05/2019 but otherwise age-indeterminate.  No acute posttraumatic intracranial findings.  Redemonstrated chronic cortically based infarcts within bilateral frontal and left parietal lobes.  CT cervical spine negative for fracture or dislocation.  MRI multifocal bilateral acute ischemia, predominantly affecting the deep gray matter.  There was also 2 cortical foci within the right MCA territory.  No hemorrhage or mass effect.  Old bilateral MCA territory infarcts. Chest X RAY suspect acute fracture right eighth rib. Patient did not receive TNK.  As well as swelling of the lower extremities however patient had not been taking her Lasix for several months. MRA of the head was unremarkable.  Admission chemistries unremarkable except sodium 134 glucose 172 BUN 45 creatinine 2.29, WBC 14,800, CK 1265, urine culture greater than 100,000 E. coli, hemoglobin A1c 7.0.  Carotid Dopplers with left 40 to 59% ICA stenosis.  Echocardiogram ejection fraction of 60 to 65% no wall motion abnormality grade 1 diastolic dysfunction.  Neurology follow-up patient placed on low-dose aspirin and Plavix for CVA prophylaxis x 3 weeks then aspirin alone.  Recommendations of 30-day cardiac event monitor Lovenox added for DVT prophylaxis.  Placed on course of intravenous Rocephin for UTI.  WOC consulted for left foot wound followed by podiatry Dr. Ardelle Anton and recently did see the patient 5/13 and debrided the wound .Follow-up hemoglobin 08/11/2022 of 8.3 that has steadily declined from 12.52 weeks prior with no visible signs of bleeding initial concern for possible relation to hemoconcentration his WBC and platelets have also dropped.  FOBT pending and anemia panel.  His follow-up CBC the following day had improved to 9.5.     Glasgow Coma Scale Score: 15  Patient's [facility] record from [facility] has been reviewed by the rehabilitation admission  coordinator and physician.  Past Medical History  Past Medical History:  Diagnosis Date   Breast cancer (HCC) 12/2019   left breast DCIS   CKD (chronic kidney disease) 06/07/2016   Stage 2, GFR 60-89 ml/min   Diabetic polyneuropathy 06/07/2016   Dyslipidemia    Endometrial adenocarcinoma 2012   Fever blister    Hyperlipidemia    Hypertension, essential 06/07/2016   Major depressive disorder    Mixed dyslipidemia 06/07/2016   MRSA infection 10/2019   left foot wound   Obstructive sleep apnea 06/07/2016   does not use CPAP   Peripheral neuropathy    Retinopathy    Severe obesity    Skin ulcer of left foot with fat layer exposed 11/02/2016   Stroke    Asymptomatic, discovered via neuroimaging; small infarct in left parietal lobe; also concern for small b/l frontal infarcts   Tachycardia 07/19/2017   Type 2 diabetes mellitus with hyperglycemia, with long-term current use of insulin 06/07/2016   Unilateral primary osteoarthritis, right knee 04/16/2019   Has the patient had major surgery during 100 days prior to admission? No  Family History  family history includes Alzheimer's disease in her father and mother; Bladder Cancer in her father; Breast cancer (age of onset: 54) in her paternal aunt; CAD in her father; Diverticulitis in her sister; Heart attack in her father; Hypertension in her mother and sister; Obesity in her sister; Prostate cancer in her paternal uncle; Voice disorder in her brother.  Current Medications   Current Facility-Administered Medications:    0.9 %  sodium chloride infusion, , Intravenous, Continuous, Noralee Stain, DO, Last Rate: 75 mL/hr at 08/12/22 8119, Infusion Verify at 08/12/22 1478   acetaminophen (TYLENOL) tablet 650 mg, 650 mg, Oral, Q6H PRN, Darlin Drop, DO, 650 mg at 08/09/22 2132   aspirin EC tablet 81 mg, 81 mg, Oral, Daily, Darlin Drop, DO, 81 mg at 08/12/22 2956   atorvastatin (LIPITOR) tablet 80 mg, 80 mg, Oral, Daily, Dow Adolph N, DO, 80  mg at 08/12/22 2130   buPROPion (WELLBUTRIN XL) 24 hr tablet 300 mg, 300 mg, Oral, Daily, Noralee Stain, DO, 300 mg at 08/12/22 8657   carvedilol (COREG) tablet 6.25 mg, 6.25 mg, Oral, BID, Noralee Stain, DO, 6.25 mg at 08/12/22 8469   clopidogrel (PLAVIX) tablet 75 mg, 75 mg, Oral, Daily, Dow Adolph N, DO, 75 mg at 08/12/22 0832   enoxaparin (LOVENOX) injection 40 mg, 40 mg, Subcutaneous, Q24H, Narda Bonds, MD, 40 mg at 08/12/22 6295   escitalopram (LEXAPRO) tablet 20 mg, 20 mg, Oral, Daily, Noralee Stain, DO, 20 mg at 08/12/22 2841   HYDROmorphone (DILAUDID) injection 0.5 mg, 0.5 mg, Intravenous, Q4H PRN, Margo Aye, Carole N, DO   insulin aspart (novoLOG) injection 0-9 Units, 0-9 Units, Subcutaneous, TID WC, Noralee Stain, DO, 2 Units at 08/12/22 0833   insulin glargine-yfgn (SEMGLEE) injection  15 Units, 15 Units, Subcutaneous, QHS, Narda Bonds, MD, 15 Units at 08/11/22 2202   labetalol (NORMODYNE) injection 5 mg, 5 mg, Intravenous, Q2H PRN, Hall, Carole N, DO   losartan (COZAAR) tablet 50 mg, 50 mg, Oral, q morning, Noralee Stain, DO, 50 mg at 08/12/22 4098   melatonin tablet 3 mg, 3 mg, Oral, QHS PRN, Darlin Drop, DO   mupirocin cream (BACTROBAN) 2 %, , Topical, Daily, Narda Bonds, MD, Given at 08/12/22 [phone]   oxyCODONE (Oxy IR/ROXICODONE) immediate release tablet 5 mg, 5 mg, Oral, Q6H PRN, Margo Aye, Carole N, DO   polyethylene glycol (MIRALAX / GLYCOLAX) packet 17 g, 17 g, Oral, Daily PRN, Hall, Carole N, DO   prochlorperazine (COMPAZINE) injection 5 mg, 5 mg, Intravenous, Q6H PRN, Dow Adolph N, DO  Patients Current Diet:  Diet Order             Diet Carb Modified Fluid consistency: Thin; Room service appropriate? Yes  Diet effective now                  Precautions / Restrictions Precautions Precautions: Fall Restrictions Weight Bearing Restrictions: No   Has the patient had 2 or more falls or a fall with injury in the past year?Yes frequent falls in past week  pta  Prior Activity Level Limited Community (1-2x/wk): Mod I with cane, drove very limited  Prior Functional Level Prior Function Prior Level of Function : Independent/Modified Independent, Driving Mobility Comments: walked with cane out of the home, no AD inside ADLs Comments: sits to shower  Self Care: Did the patient need help bathing, dressing, using the toilet or eating?  Independent  Indoor Mobility: Did the patient need assistance with walking from room to room (with or without device)? Independent  Stairs: Did the patient need assistance with internal or external stairs (with or without device)? Independent  Functional Cognition: Did the patient need help planning regular tasks such as shopping or remembering to take medications? Independent  Patient Information Are you of Hispanic, Latino/a,or Spanish origin?: A. No, not of Hispanic, Latino/a, or Spanish origin What is your race?: A. White Do you need or want an interpreter to communicate with a doctor or health care staff?: 0. No  Patient's Response To:  Health Literacy and Transportation Is the patient able to respond to health literacy and transportation needs?: Yes Health Literacy - How often do you need to have someone help you when you read instructions, pamphlets, or other written material from your doctor or pharmacy?: Never In the past 12 months, has lack of transportation kept you from medical appointments or from getting medications?: No In the past 12 months, has lack of transportation kept you from meetings, work, or from getting things needed for daily living?: No  Journalist, newspaper / Equipment Home Assistive Devices/Equipment: None Home Equipment: Cane - single point, Agricultural consultant (2 wheels), Shower seat, Hand held shower head  Prior Device Use: Indicate devices/aids used by the patient prior to current illness, exacerbation or injury?  Cane outside the home  Current Functional Level Cognition   Overall Cognitive Status: No family/caregiver present to determine baseline cognitive functioning Orientation Level: Oriented X4 General Comments: some STM deficits vs decreased attention?    Extremity Assessment (includes Sensation/Coordination)  Upper Extremity Assessment: Overall WFL for tasks assessed  Lower Extremity Assessment: Defer to PT evaluation    ADLs  Overall ADL's : Needs assistance/impaired Eating/Feeding: Independent Grooming: Set up, Sitting Upper Body Bathing: Minimal  assistance, Sitting Lower Body Bathing: Total assistance, +2 for physical assistance, Sit to/from stand Upper Body Dressing : Minimal assistance, Sitting Lower Body Dressing: Total assistance, Bed level Toilet Transfer: Minimal assistance, Stand-pivot, Rolling walker (2 wheels) Toilet Transfer Details (indicate cue type and reason): simulated with bed to chair. assist to steady and to stabilize walker as pt pulling up on walker despite hand placement cues. Extra time and effort Functional mobility during ADLs: +2 for physical assistance, Minimal assistance    Mobility  Overal bed mobility: Needs Assistance Bed Mobility: Supine to Sit Rolling: Min assist Sidelying to sit: HOB elevated, Min assist Supine to sit: Min assist General bed mobility comments: pt coming to longsitting position then pivoting hips to EOB position. min Assist to stabilize during trunk elevation. extra time and effort.    Transfers  Overall transfer level: Needs assistance Equipment used: Rolling walker (2 wheels), None Transfers: Sit to/from Stand Sit to Stand: Min assist General transfer comment: to/from elevated EOB and recliner. assist to steady walker and control descent    Ambulation / Gait / Stairs / Wheelchair Mobility  Ambulation/Gait Ambulation/Gait assistance: Mod assist, Min assist Gait Distance (Feet):  (10 ft + 10 ft without AD with mod assist, 25 ft + 10 ft + 10 ft with RW & CGA) Assistive device: Rolling  walker (2 wheels) Gait Pattern/deviations: Decreased step length - right, Decreased step length - left, Decreased stride length, Decreased dorsiflexion - right, Decreased dorsiflexion - left General Gait Details: Decreased heel strike BLE, LLE knee flexed throughout (no knee extension in stance phase but no buckling noted). PT provides cuing for increased heel strike but poor return demo. Gait velocity: decreased. Gait velocity interpretation: <1.31 ft/sec, indicative of household ambulator    Posture / Balance Balance Overall balance assessment: Needs assistance Sitting-balance support: No upper extremity supported, Feet supported Sitting balance-Leahy Scale: Good Standing balance support: During functional activity, No upper extremity supported Standing balance-Leahy Scale: Poor Standing balance comment: Reliant on at least 1 UE for support to wash hands at sink, RW to ambulate. Increased flexion of knees and trunk as time in standing and pt fatigue increased.    Special needs/care consideration Fall precautions     Leanna Battles, RN  Registered Nurse WOC   Consult Note    Addendum   Date of Service: 08/11/2022  7:48 AM    Show:Clear all [x] Written[x] Templated[x] Copied  Added by: [x] Leanna Battles, RN  [] Hover for details WOC Nurse Consult Note: Reason for Consult: Consult requested for left foot wound.  Performed remotely after review of progress notes and a photo in the EMR.   Pt is followed by Dr Ardelle Anton of the podiatry team prior to admission. He recently saw the patient on 5/13 in the office and debrided the wound.  He has ordered antibiotic ointment.  Left plantar foot with chronic full thickness wound; 1.5X.6X.3cm, according to podiatry notes, 90% red, 10% yellow with dry callous edges.  Dressing procedure/placement/frequency: Continue present plan of care as previously ordered; topical treatment orders provided for bedside nurses to perform: Apply Bactroban to left  foot wound Q day, then cover with foam dressing.  Change foam dressing Q 3 days or PRN soiling. Pt should continue follow-up with podiatry team after discharge.  Please re-consult if further assistance is needed.  Mardee Postin MSN, RN, CWOCN, CWCN-AP, [place] [phone]            Revision History  Bilateral groin rash, buttocks abrasion, ecchymosis. Reports skin  tear near anus causing irritation and bleeding since fall     Previous Home Environment  Living Arrangements: Alone  Lives With: Alone Available Help at Discharge: Family, Available 24 hours/day (will go live with sister who can do 24/7 assist in her home) Type of Home: House Home Layout: One level Home Access: Stairs to enter Entrance Stairs-Rails: Right, Left Entrance Stairs-Number of Steps: 5 Bathroom Shower/Tub: Health visitor: Handicapped height Bathroom Accessibility: Yes How Accessible: Accessible via walker Home Care Services: No  Discharge Living Setting Plans for Discharge Living Setting: Lives with (comment) (to live with sister, [name] and her brother in law at discharge) Type of Home at Discharge: House Discharge Home Layout: Two level, Able to live on main level with bedroom/bathroom Discharge Home Access: Stairs to enter Entrance Stairs-Rails: Right, Left Entrance Stairs-Number of Steps: 5 Discharge Bathroom Shower/Tub: Tub/shower unit, Walk-in shower (has access to both downstairs) Discharge Bathroom Toilet: Standard Discharge Bathroom Accessibility: Yes How Accessible: Accessible via walker Does the patient have any problems obtaining your medications?: No  Social/Family/Support Systems Contact Information: sister, [name] and she is also her POA Anticipated Caregiver: [name], sister Anticipated Caregiver's Contact Information: see contacts Ability/Limitations of Caregiver: none Caregiver Availability: 24/7 Discharge Plan Discussed with Primary Caregiver: Yes Is Caregiver In  Agreement with Plan?: Yes Does Caregiver/Family have Issues with Lodging/Transportation while Pt is in Rehab?: Yes  Goals Patient/Family Goal for Rehab: supervision with PT, supervision to min OT, supervision SLP Expected length of stay: ELOS 10 to 14 days Additional Information: To go stay with sister at discharge Pt/Family Agrees to Admission and willing to participate: Yes Program Orientation Provided & Reviewed with Pt/Caregiver Including Roles  & Responsibilities: Yes  Decrease burden of Care through IP rehab admission: n/a  Possible need for SNF placement upon discharge:not anticipated  Patient Condition: This patient's condition remains as documented in the consult dated 08/09/22, in which the Rehabilitation Physician determined and documented that the patient's condition is appropriate for intensive rehabilitative care in an inpatient rehabilitation facility. Will admit to inpatient rehab today.  Preadmission Screen Completed By:  Clois Dupes, RN MSN 08/12/2022 11:30 AM ______________________________________________________________________   Discussed status with Dr. Shearon Stalls on 08/12/22 at 1130 and received approval for admission today.  Admission Coordinator:  Worthy Keeler MSN time 1610 Date 08/12/22

## 2022-08-11 NOTE — Progress Notes (Signed)
Physical Therapy Treatment Patient Details Name: [name] MRN: [id number] DOB: 04/14/1950 Today's Date: 08/11/2022   History of Present Illness Pt is a 72 y.o. female who presented 08/08/22  with progressive weakness and recurrent falls. MRI brain w/o c demonstrates multifocal bilateral acute ischemia, predominantly affecting the deep gray matter. There also 2 cortical foci within the right MCA territory. No hemorrhage or mass effect. Patient also has R rib fx. PMH: ductal carcinoma in situ of left breast, DM2, obesity, CKD, HLD, HTN, OSA    PT Comments    Pt seen for PT tx with pt agreeable. Pt reports prior to admission she ambulated with [facility] outside of the home with PRN furniture walking in the home. On this date, pt attempted gait without AD with mod assist with cuing to not hold to furniture in room but pt still reaching for it intermittently. Pt with slow, guarded gait when ambulating without AD. Pt requires less balance when ambulating with AD but only able to walk a maximum distance of 25 ft. Pt toileted & required assistance for thorough peri hygiene. Recommend ongoing PT services to address strength, balance, & endurance to increase independence with mobility & reduce fall risk. Max HR 84 bpm.    Recommendations for follow up therapy are one component of a multi-disciplinary discharge planning process, led by the attending physician.  Recommendations may be updated based on patient status, additional functional criteria and insurance authorization.  Follow Up Recommendations       Assistance Recommended at Discharge Frequent or constant Supervision/Assistance  Patient can return home with the following A lot of help with bathing/dressing/bathroom;Help with stairs or ramp for entrance;Assist for transportation;Assistance with cooking/housework;A little help with walking and/or transfers;Direct supervision/assist for medications management;Direct supervision/assist for financial  management   Equipment Recommendations  None recommended by PT (TBD in next venue)    Recommendations for Other Services       Precautions / Restrictions Precautions Precautions: Fall Restrictions Weight Bearing Restrictions: No     Mobility  Bed Mobility               General bed mobility comments: not tested, pt received & left sitting in recliner    Transfers Overall transfer level: Needs assistance Equipment used: Rolling walker (2 wheels), None Transfers: Sit to/from Stand Sit to Stand: Mod assist, Min assist           General transfer comment: STS with 1UE pushing to stand with mod assist, STS with BUE pushing to stand with CGA<>min assist    Ambulation/Gait Ambulation/Gait assistance: Mod assist, Min assist Gait Distance (Feet):  (10 ft + 10 ft without AD with mod assist, 25 ft + 10 ft + 10 ft with RW & CGA) Assistive device: Rolling walker (2 wheels) Gait Pattern/deviations: Decreased step length - right, Decreased step length - left, Decreased stride length, Decreased dorsiflexion - right, Decreased dorsiflexion - left Gait velocity: decreased.     General Gait Details: Decreased heel strike BLE, LLE knee flexed throughout (no knee extension in stance phase but no buckling noted). PT provides cuing for increased heel strike but poor return demo.   Stairs             Wheelchair Mobility    Modified Rankin (Stroke Patients Only)       Balance Overall balance assessment: Needs assistance Sitting-balance support: No upper extremity supported, Feet supported Sitting balance-Leahy Scale: Good     Standing balance support: During functional activity, No upper  extremity supported Standing balance-Leahy Scale: Poor                              Cognition Arousal/Alertness: Awake/alert Behavior During Therapy: Flat affect Overall Cognitive Status: No family/caregiver present to determine baseline cognitive functioning                                  General Comments: Pt follows simple commands throughout session. Notes activity fatigues her.        Exercises      General Comments General comments (skin integrity, edema, etc.): Pt with continent BM on toilet, requires assistance for hygiene after attempting it herself. Pt noted to have bleeding skin tears in gluteal cleft & nurse & MD notified.      Pertinent Vitals/Pain Pain Assessment Pain Assessment: Faces Faces Pain Scale: Hurts even more Pain Location: R ribs when coughing Pain Descriptors / Indicators: Sore, Discomfort, Guarding Pain Intervention(s): Monitored during session, Limited activity within patient's tolerance (pt utilized pillow for increased compression & comfort)    Home Living     Available Help at Discharge: Family;Available 24 hours/day (will go live with sister who can do 24/7 assist in her home)                    Prior Function            PT Goals (current goals can now be found in the care plan section) Acute Rehab PT Goals Patient Stated Goal: become more independent PT Goal Formulation: With patient/family Time For Goal Achievement: 08/19/22 Potential to Achieve Goals: Fair Progress towards PT goals: Progressing toward goals    Frequency           PT Plan Current plan remains appropriate    Co-evaluation              AM-PAC PT "6 Clicks" Mobility   Outcome Measure  Help needed turning from your back to your side while in a flat bed without using bedrails?: A Little Help needed moving from lying on your back to sitting on the side of a flat bed without using bedrails?: A Little Help needed moving to and from a bed to a chair (including a wheelchair)?: A Little Help needed standing up from a chair using your arms (e.g., wheelchair or bedside chair)?: A Little Help needed to walk in hospital room?: A Little Help needed climbing 3-5 steps with a railing? : A Lot 6 Click Score:  17    End of Session Equipment Utilized During Treatment: Gait belt Activity Tolerance: Patient tolerated treatment well;Patient limited by fatigue Patient left: in chair;with chair alarm set;with call bell/phone within reach Nurse Communication:  (bleeding 2/2 gluteal skin tears) PT Visit Diagnosis: Other abnormalities of gait and mobility (R26.89);Repeated falls (R29.6);Muscle weakness (generalized) (M62.81);Difficulty in walking, not elsewhere classified (R26.2);Pain;Unsteadiness on feet (R26.81);Other symptoms and signs involving the nervous system (R29.898) Pain - Right/Left: Right Pain - part of body:  (ribs)     Time: 8295-6213 PT Time Calculation (min) (ACUTE ONLY): 32 min  Charges:  $Therapeutic Activity: 23-37 mins                     Aleda Grana, PT, DPT 08/11/22, 10:48 AM   Sandi Mariscal 08/11/2022, 10:46 AM

## 2022-08-11 NOTE — Progress Notes (Addendum)
Inpatient Rehabilitation Admissions Coordinator   I await insurance approval for possible CIR admit.  Ottie Glazier, RN, MSN Rehab Admissions Coordinator [phone] 08/11/2022 12:00 PM   I have received insurance approval for CIR and patient is aware. I await medical readiness to admit. Patient is aware and I have left voicemail for her sister to give her an update.  Ottie Glazier, RN, MSN Rehab Admissions Coordinator [phone] 08/11/2022 2:15 PM

## 2022-08-11 NOTE — Progress Notes (Signed)
PROGRESS NOTE    [name]  WUJ:[id number] DOB: Oct 31, 1950 DOA: 08/08/2022 PCP: Laurann Montana, MD   Brief Narrative: [name] is a 72 y.o. female with a history of ductal carcinoma in situ of left breast, diabetes mellitus type 2, obesity, major depressive disorder, hypertension, peripheral neuropathy, retinopathy.  Patient presented secondary to progressive weakness with multiple falls.  Found to have evidence of acute multifocal strokes involving the right MCA territory and left subcortical lacunar areas. Hospitalization complicated by AKI, improving with IV fluids.    Assessment and Plan:  Acute cryptogenic stroke MRI brain confirms multifocal bilateral acute ischemia affecting the deep gray matter in addition to evidence of 2 cortical foci noted within the right MCA territory. MRA head normal. LDL of 58. Hemoglobin A1C of 7.0%. Transthoracic Echocardiogram significant for normal LVEF of 60 to 65% with mild to moderately dilated left atrium and no atrial level shunt detected.  Per neurology, cryptogenic embolism and small vessel disease or etiology for stroke.  Neurology recommendations for 30-day heart monitor at discharge to evaluate for paroxysmal atrial fibrillation, Plavix for 3 weeks and aspirin indefinitely with repeat carotid ultrasound in 6 to 12 months. PT/OT recommendations for acute inpatient rehabilitation. Recommendation for Neurology follow-up in 4 weeks.  Left carotid artery stenosis Mild.  Neurology recommendations for surveillance carotid ultrasound in 6 to 12 months.  UTI Urine culture significant for pan-sensitive E. coli. Patient started on ceftriaxone and has completed a 3 day course.  AKI on CKD stage IIIa Unclear baseline, although most recent creatinine of 1.42 earlier this month. From PCP office, BUN/Creatinine of 32/1.76 on 5/15 and 24/1.33 on 11/17/22. Creatinine of 2.29 on admission and is improving with IV fluids. -Continue IV fluids -Since  renal function is improving, will continue losartan for now -Follow-up BMP today  Diabetes mellitus type 2 Controlled with recent hemoglobin A1C of 7.0%. Patient is managed with NPH 70/30 24 units in the morning and 20 units in the evening as an outpatient. Patient started on SSI on admission. -Continue SSI -Continue Semglee 15 units qHS  Rib fracture Secondary to fall involving the eighth rib. -Continue incentive spirometer and supportive care  Primary hypertension -Continue Coreg and losartan  Acute anemia From PCP office, Last hemoglobin was 12.9 from 05/13/2021. Hemoglobin of 12.1 on admission with acute drop of hemoglobin to 9.1 and now 8.6. No hemorrhaging noted per patient. Initially concern for possible relation to hemoconcentration as WBC and platelets have also dropped accordingly, however per nursing, patient has some bleeding from skin wounds on buttocks without evidence of hematochezia. Hemoglobin Appears to be stabilizing. -Repeat CBC in AM -FOBT still pending -GI consult -Anemia panel  Pressure injury Right/left buttocks. Present on admission.   DVT prophylaxis: Lovenox Code Status:   Code Status: Full Code Family Communication: None at bedside Disposition Plan: Discharge to CIR is able. Medically stable for discharge once hemoglobin stabilizes.   Consultants:  Neurology  Procedures:  5/21: Transthoracic Echocardiogram 5/21: Carotid ultrasound  Antimicrobials: Ceftriaxone IV    Subjective: Patient reports having bowel movements but not noticing any bleeding. She states she likely has bleeding from sores on her buttocks.  Objective: BP (!) 163/47 (BP Location: Right Arm)   Pulse 60   Temp 97.8 F (36.6 C) (Oral)   Resp 17   Ht 5' 9.5" (1.765 m)   Wt 106.6 kg   SpO2 100%   BMI 34.21 kg/m   Examination:  General exam: Appears calm and comfortable Respiratory system:  Clear to auscultation. Respiratory effort normal. Cardiovascular system: S1 &  S2 heard, RRR. Gastrointestinal system: Abdomen is nondistended, soft and nontender. Normal bowel sounds heard. Central nervous system: Alert and oriented.  Psychiatry: Judgement and insight appear normal. Mood & affect appropriate.    Data Reviewed: I have personally reviewed following labs and imaging studies  CBC Lab Results  Component Value Date   WBC 5.5 08/11/2022   RBC 2.53 (L) 08/11/2022   HGB 8.3 (L) 08/11/2022   HCT 24.6 (L) 08/11/2022   MCV 97.2 08/11/2022   MCH 32.8 08/11/2022   PLT 194 08/11/2022   MCHC 33.7 08/11/2022   RDW 13.3 08/11/2022   LYMPHSABS 2.1 08/08/2022   MONOABS 1.0 08/08/2022   EOSABS 0.0 08/08/2022   BASOSABS 0.0 08/08/2022     Last metabolic panel Lab Results  Component Value Date   NA 132 (L) 08/10/2022   K 3.8 08/10/2022   CL 104 08/10/2022   CO2 20 (L) 08/10/2022   BUN 33 (H) 08/10/2022   CREATININE 1.55 (H) 08/10/2022   GLUCOSE 157 (H) 08/10/2022   GFRNONAA 36 (L) 08/10/2022   GFRAA 81 01/03/2017   CALCIUM 8.0 (L) 08/10/2022   PHOS 3.9 08/09/2022   PROT 5.4 (L) 08/10/2022   ALBUMIN 2.3 (L) 08/10/2022   BILITOT 1.3 (H) 08/10/2022   ALKPHOS 30 (L) 08/10/2022   AST 32 08/10/2022   ALT 20 08/10/2022   ANIONGAP 8 08/10/2022    GFR: Estimated Creatinine Clearance: 43.7 mL/min (A) (by C-G formula based on SCr of 1.55 mg/dL (H)).  Recent Results (from the past 240 hour(s))  Urine Culture     Status: Abnormal   Collection Time: 08/09/22  1:30 AM   Specimen: Urine, Clean Catch  Result Value Ref Range Status   Specimen Description URINE, CLEAN CATCH  Final   Special Requests   Final    NONE Performed at [facility], [address], [place], [place] [address]    Culture >=100,000 COLONIES/mL ESCHERICHIA COLI (A)  Final   Report Status 08/11/2022 FINAL  Final   Organism ID, Bacteria ESCHERICHIA COLI (A)  Final      Susceptibility   Escherichia coli - MIC*    AMPICILLIN <=2 SENSITIVE Sensitive     CEFAZOLIN <=4 SENSITIVE  Sensitive     CEFEPIME <=0.12 SENSITIVE Sensitive     CIPROFLOXACIN <=0.25 SENSITIVE Sensitive     GENTAMICIN <=1 SENSITIVE Sensitive     IMIPENEM <=0.25 SENSITIVE Sensitive     NITROFURANTOIN <=16 SENSITIVE Sensitive     TRIMETH/SULFA <=20 SENSITIVE Sensitive     AMPICILLIN/SULBACTAM <=2 SENSITIVE Sensitive     PIP/TAZO <=4 SENSITIVE Sensitive     * >=100,000 COLONIES/mL ESCHERICHIA COLI      Radiology Studies: VAS US CAROTID  Result Date: 08/09/2022 Carotid Arterial Duplex Study Patient Name:  [facility]  Date of Exam:   08/09/2022 Medical Rec #: [id number]        Accession #:    [id number] Date of Birth: [birth year]        Patient Gender: F Patient Age:   47 years Exam Location:  [facility] Procedure:      VAS US CAROTID Referring Phys: Dow Adolph --------------------------------------------------------------------------------  Indications:       CVA and Weakness. Risk Factors:      Hypertension, hyperlipidemia, Diabetes. Comparison Study:  No previous study. Performing Technologist: McKayla Maag RVT, [place]  Examination Guidelines: A complete evaluation includes B-mode imaging, spectral Doppler, color  Doppler, and power Doppler as needed of all accessible portions of each vessel. Bilateral testing is considered an integral part of a complete examination. Limited examinations for reoccurring indications may be performed as noted.  Right Carotid Findings: +----------+--------+--------+--------+---------------------+------------------+           PSV cm/sEDV cm/sStenosisPlaque Description   Comments           +----------+--------+--------+--------+---------------------+------------------+ CCA Prox  78      9                                                       +----------+--------+--------+--------+---------------------+------------------+ CCA Distal64      7                                                        +----------+--------+--------+--------+---------------------+------------------+ ICA Prox  106     17      1-39%   homogeneous,         intimal thickening                                   irregular and                                                             calcific                                +----------+--------+--------+--------+---------------------+------------------+ ICA Mid   97      19                                                      +----------+--------+--------+--------+---------------------+------------------+ ICA Distal127     21                                                      +----------+--------+--------+--------+---------------------+------------------+ ECA       193     19                                                      +----------+--------+--------+--------+---------------------+------------------+ +----------+--------+-------+--------+-------------------+           PSV cm/sEDV cmsDescribeArm Pressure (mmHG) +----------+--------+-------+--------+-------------------+ [id number]            Stenotic                    +----------+--------+-------+--------+-------------------+ +---------+--------+--+--------+-+---------+ VertebralPSV cm/s32EDV cm/s7Antegrade +---------+--------+--+--------+-+---------+  Left Carotid Findings: +----------+--------+--------+--------+---------------------+------------------+           PSV cm/sEDV cm/sStenosisPlaque Description   Comments           +----------+--------+--------+--------+---------------------+------------------+ CCA Prox  107     9                                                       +----------+--------+--------+--------+---------------------+------------------+ CCA Distal69      13                                   intimal thickening +----------+--------+--------+--------+---------------------+------------------+ ICA Prox  129     18      40-59%   homogeneous,                                                              irregular and                                                             calcific                                +----------+--------+--------+--------+---------------------+------------------+ ICA Mid   120     13                                                      +----------+--------+--------+--------+---------------------+------------------+ ICA Distal114     22                                                      +----------+--------+--------+--------+---------------------+------------------+ ECA       107                                                             +----------+--------+--------+--------+---------------------+------------------+ +----------+--------+--------+----------------+-------------------+           PSV cm/sEDV cm/sDescribe        Arm Pressure (mmHG) +----------+--------+--------+----------------+-------------------+ [id number]             Multiphasic, WNL                    +----------+--------+--------+----------------+-------------------+ +---------+--------+--+--------+-+---------+ VertebralPSV cm/s49EDV cm/s9Antegrade +---------+--------+--+--------+-+---------+   Summary: Right Carotid: Velocities in the right ICA are consistent with a 1-39% stenosis. Left Carotid: Velocities in the left ICA are consistent with  a 40-59% stenosis. Vertebrals:  Bilateral vertebral arteries demonstrate antegrade flow. Subclavians: Right subclavian artery was stenotic. Normal flow hemodynamics were              seen in the left subclavian artery. *See table(s) above for measurements and observations.  Electronically signed by Coral Else MD on 08/09/2022 at 7:15:45 PM.    Final       LOS: 3 days    Jacquelin Hawking, MD [facility] 08/11/2022, 11:58 AM   If 7PM-7AM, please contact night-coverage [email or web address]

## 2022-08-11 NOTE — Inpatient Diabetes Management (Signed)
Inpatient Diabetes Program Recommendations  AACE/ADA: New Consensus Statement on Inpatient Glycemic Control (2015)  Target Ranges:  Prepandial:   less than 140 mg/dL      Peak postprandial:   less than 180 mg/dL (1-2 hours)      Critically ill patients:  140 - 180 mg/dL   Lab Results  Component Value Date   GLUCAP 104 (H) 08/11/2022   HGBA1C 7.0 (H) 08/09/2022    Review of Glycemic Control  Latest Reference Range & Units 08/10/22 07:26 08/10/22 12:09 08/10/22 16:22 08/10/22 22:25 08/11/22 07:21  Glucose-Capillary 70 - 99 mg/dL 782 (H) 956 (H) 213 (H) 162 (H) 104 (H)  (H): Data is abnormally high  Diabetes history: DM2 Outpatient Diabetes medications: 70/30 24 units QAM, 20 units QPM, Metformin 2 gm daily Current orders for Inpatient glycemic control: Novolog 0-9 units TID and Semglee 15 units QD  Inpatient Diabetes Program Recommendations:    If postprandials remain elevated, might consider:  Novolog 3 units TID with meals  (she takes 70/30 at home).  Will continue to follow while inpatient.  Thank you, Dulce Sellar, MSN, CDCES Diabetes Coordinator Inpatient Diabetes Program [phone] (team pager from 8a-5p)

## 2022-08-11 NOTE — Progress Notes (Signed)
Occupational Therapy Treatment Patient Details Name: [name] MRN: [id number] DOB: [birth year] Today's Date: 08/11/2022   History of present illness Pt is a 72 y.o. female who presented 08/08/22  with progressive weakness and recurrent falls. MRI brain w/o c demonstrates multifocal bilateral acute ischemia, predominantly affecting the deep gray matter. There also 2 cortical foci within the right MCA territory. No hemorrhage or mass effect. Patient also has R rib fx. PMH: ductal carcinoma in situ of left breast, DM2, obesity, CKD, HLD, HTN, OSA   OT comments  Pt progressing towards acute OT goals. Focus of session was bed mobility and simulated toilet transfer. Pt up in chair at end of session. Up to min A needed for transfers and rw. Pt up in recliner at end of session. D/c recommendation remains appropriate.    Recommendations for follow up therapy are one component of a multi-disciplinary discharge planning process, led by the attending physician.  Recommendations may be updated based on patient status, additional functional criteria and insurance authorization.    Assistance Recommended at Discharge Frequent or constant Supervision/Assistance  Patient can return home with the following  Two people to help with walking and/or transfers;A lot of help with bathing/dressing/bathroom;Assistance with cooking/housework;Assist for transportation;Help with stairs or ramp for entrance   Equipment Recommendations  BSC/3in1    Recommendations for Other Services      Precautions / Restrictions Precautions Precautions: Fall Restrictions Weight Bearing Restrictions: No       Mobility Bed Mobility Overal bed mobility: Needs Assistance Bed Mobility: Supine to Sit     Supine to sit: Min assist     General bed mobility comments: pt coming to longsitting position then pivoting hips to EOB position. min Assist to stabilize during trunk elevation. extra time and effort.     Transfers Overall transfer level: Needs assistance Equipment used: Rolling walker (2 wheels), None Transfers: Sit to/from Stand Sit to Stand: Min assist           General transfer comment: to/from elevated EOB and recliner. assist to steady walker and control descent     Balance Overall balance assessment: Needs assistance Sitting-balance support: No upper extremity supported, Feet supported Sitting balance-Leahy Scale: Good     Standing balance support: During functional activity, No upper extremity supported Standing balance-Leahy Scale: Poor Standing balance comment: Reliant on at least 1 UE for support to wash hands at sink, RW to ambulate. Increased flexion of knees and trunk as time in standing and pt fatigue increased.                           ADL either performed or assessed with clinical judgement   ADL Overall ADL's : Needs assistance/impaired                         Toilet Transfer: Minimal assistance;Stand-pivot;Rolling walker (2 wheels) Toilet Transfer Details (indicate cue type and reason): simulated with bed to chair. assist to steady and to stabilize walker as pt pulling up on walker despite hand placement cues. Extra time and effort                Extremity/Trunk Assessment Upper Extremity Assessment Upper Extremity Assessment: Overall WFL for tasks assessed   Lower Extremity Assessment Lower Extremity Assessment: Defer to PT evaluation        Vision       Perception     Praxis  Cognition Arousal/Alertness: Awake/alert Behavior During Therapy: Flat affect Overall Cognitive Status: No family/caregiver present to determine baseline cognitive functioning                                 General Comments: some STM deficits vs decreased attention?        Exercises      Shoulder Instructions       General Comments Pt with continent BM on toilet, requires assistance for hygiene after attempting  it herself. Pt noted to have bleeding skin tears in gluteal cleft & nurse & MD notified.    Pertinent Vitals/ Pain       Pain Assessment Pain Assessment: Faces Faces Pain Scale: Hurts little more Pain Location: R ribs Pain Descriptors / Indicators: Sore, Discomfort, Guarding Pain Intervention(s): Monitored during session, Limited activity within patient's tolerance, Repositioned  Home Living                                          Prior Functioning/Environment              Frequency  Min 2X/week        Progress Toward Goals  OT Goals(current goals can now be found in the care plan section)     Acute Rehab OT Goals OT Goal Formulation: With patient Time For Goal Achievement: 08/23/22 Potential to Achieve Goals: Good ADL Goals Pt Will Perform Grooming: with supervision;standing Pt Will Perform Upper Body Dressing: with set-up;sitting Pt Will Perform Lower Body Dressing: with min assist;with adaptive equipment;sit to/from stand Pt Will Transfer to Toilet: with supervision;ambulating;bedside commode Pt Will Perform Toileting - Clothing Manipulation and hygiene: with supervision;sit to/from stand Additional ADL Goal #1: Pt will perform bed mobility with min assist in preparation for ADLs.  Plan Discharge plan remains appropriate    Co-evaluation                 AM-PAC OT "6 Clicks" Daily Activity     Outcome Measure   Help from another person eating meals?: None Help from another person taking care of personal grooming?: A Little Help from another person toileting, which includes using toliet, bedpan, or urinal?: A Lot Help from another person bathing (including washing, rinsing, drying)?: A Lot Help from another person to put on and taking off regular upper body clothing?: A Little Help from another person to put on and taking off regular lower body clothing?: Total 6 Click Score: 15    End of Session Equipment Utilized During  Treatment: Rolling walker (2 wheels)  OT Visit Diagnosis: Unsteadiness on feet (R26.81);Other abnormalities of gait and mobility (R26.89);Pain;Muscle weakness (generalized) (M62.81)   Activity Tolerance Patient limited by fatigue;Patient tolerated treatment well   Patient Left in chair;with call bell/phone within reach;with chair alarm set   Nurse Communication          Time: [phone] OT Time Calculation (min): 19 min  Charges: OT General Charges $OT Visit: 1 Visit OT Treatments $Self Care/Home Management : 8-22 mins  Raynald Kemp, OT Acute Rehabilitation Services Office: [phone]   Pilar Grammes 08/11/2022, 11:00 AM

## 2022-08-11 NOTE — Consult Note (Addendum)
WOC Nurse Consult Note: Reason for Consult: Consult requested for left foot wound.  Performed remotely after review of progress notes and a photo in the EMR.   Pt is followed by Dr Ardelle Anton of the podiatry team prior to admission. He recently saw the patient on 5/13 in the office and debrided the wound.  He has ordered antibiotic ointment.  Left plantar foot with chronic full thickness wound; 1.5X.6X.3cm, according to podiatry notes, 90% red, 10% yellow with dry callous edges.  Dressing procedure/placement/frequency: Continue present plan of care as previously ordered; topical treatment orders provided for bedside nurses to perform: Apply Bactroban to left foot wound Q day, then cover with foam dressing.  Change foam dressing Q 3 days or PRN soiling. Pt should continue follow-up with podiatry team after discharge.  Please re-consult if further assistance is needed.  Thank-you,  Cammie Mcgee MSN, RN, CWOCN, [place], CNS [phone]

## 2022-08-12 ENCOUNTER — Inpatient Hospital Stay (HOSPITAL_COMMUNITY)
Admission: RE | Admit: 2022-08-12 | Discharge: 2022-08-25 | DRG: 945 | Disposition: A | Discharge status: Home / Self Care | Payer: PPO | Source: Intra-hospital | Attending: Physical Medicine and Rehabilitation | Admitting: Physical Medicine and Rehabilitation

## 2022-08-12 ENCOUNTER — Other Ambulatory Visit: Payer: Self-pay

## 2022-08-12 ENCOUNTER — Encounter (HOSPITAL_COMMUNITY): Payer: Self-pay | Admitting: Physical Medicine and Rehabilitation

## 2022-08-12 DIAGNOSIS — L97409 Non-pressure chronic ulcer of unspecified heel and midfoot with unspecified severity: Secondary | ICD-10-CM | POA: Diagnosis not present

## 2022-08-12 DIAGNOSIS — S91301A Unspecified open wound, right foot, initial encounter: Secondary | ICD-10-CM | POA: Diagnosis not present

## 2022-08-12 DIAGNOSIS — F331 Major depressive disorder, recurrent, moderate: Secondary | ICD-10-CM | POA: Diagnosis not present

## 2022-08-12 DIAGNOSIS — Z8614 Personal history of Methicillin resistant Staphylococcus aureus infection: Secondary | ICD-10-CM

## 2022-08-12 DIAGNOSIS — Z881 Allergy status to other antibiotic agents status: Secondary | ICD-10-CM

## 2022-08-12 DIAGNOSIS — I639 Cerebral infarction, unspecified: Secondary | ICD-10-CM | POA: Diagnosis not present

## 2022-08-12 DIAGNOSIS — Z8673 Personal history of transient ischemic attack (TIA), and cerebral infarction without residual deficits: Secondary | ICD-10-CM | POA: Diagnosis present

## 2022-08-12 DIAGNOSIS — Z7984 Long term (current) use of oral hypoglycemic drugs: Secondary | ICD-10-CM

## 2022-08-12 DIAGNOSIS — Z803 Family history of malignant neoplasm of breast: Secondary | ICD-10-CM

## 2022-08-12 DIAGNOSIS — R609 Edema, unspecified: Secondary | ICD-10-CM | POA: Diagnosis present

## 2022-08-12 DIAGNOSIS — Z8249 Family history of ischemic heart disease and other diseases of the circulatory system: Secondary | ICD-10-CM

## 2022-08-12 DIAGNOSIS — Z6834 Body mass index (BMI) 34.0-34.9, adult: Secondary | ICD-10-CM

## 2022-08-12 DIAGNOSIS — R296 Repeated falls: Secondary | ICD-10-CM | POA: Diagnosis present

## 2022-08-12 DIAGNOSIS — N3941 Urge incontinence: Secondary | ICD-10-CM | POA: Diagnosis not present

## 2022-08-12 DIAGNOSIS — L89890 Pressure ulcer of other site, unstageable: Secondary | ICD-10-CM | POA: Diagnosis not present

## 2022-08-12 DIAGNOSIS — N390 Urinary tract infection, site not specified: Secondary | ICD-10-CM | POA: Diagnosis present

## 2022-08-12 DIAGNOSIS — Z880 Allergy status to penicillin: Secondary | ICD-10-CM

## 2022-08-12 DIAGNOSIS — E669 Obesity, unspecified: Secondary | ICD-10-CM | POA: Diagnosis present

## 2022-08-12 DIAGNOSIS — B9620 Unspecified Escherichia coli [E. coli] as the cause of diseases classified elsewhere: Secondary | ICD-10-CM | POA: Diagnosis not present

## 2022-08-12 DIAGNOSIS — M1711 Unilateral primary osteoarthritis, right knee: Secondary | ICD-10-CM | POA: Diagnosis present

## 2022-08-12 DIAGNOSIS — Z713 Dietary counseling and surveillance: Secondary | ICD-10-CM

## 2022-08-12 DIAGNOSIS — Z9079 Acquired absence of other genital organ(s): Secondary | ICD-10-CM

## 2022-08-12 DIAGNOSIS — N179 Acute kidney failure, unspecified: Secondary | ICD-10-CM | POA: Diagnosis not present

## 2022-08-12 DIAGNOSIS — K5900 Constipation, unspecified: Secondary | ICD-10-CM | POA: Diagnosis not present

## 2022-08-12 DIAGNOSIS — W19XXXD Unspecified fall, subsequent encounter: Secondary | ICD-10-CM | POA: Diagnosis present

## 2022-08-12 DIAGNOSIS — R600 Localized edema: Secondary | ICD-10-CM | POA: Diagnosis not present

## 2022-08-12 DIAGNOSIS — Z853 Personal history of malignant neoplasm of breast: Secondary | ICD-10-CM

## 2022-08-12 DIAGNOSIS — E782 Mixed hyperlipidemia: Secondary | ICD-10-CM | POA: Diagnosis not present

## 2022-08-12 DIAGNOSIS — I10 Essential (primary) hypertension: Secondary | ICD-10-CM | POA: Diagnosis not present

## 2022-08-12 DIAGNOSIS — D631 Anemia in chronic kidney disease: Secondary | ICD-10-CM | POA: Diagnosis present

## 2022-08-12 DIAGNOSIS — G4733 Obstructive sleep apnea (adult) (pediatric): Secondary | ICD-10-CM | POA: Diagnosis not present

## 2022-08-12 DIAGNOSIS — E1142 Type 2 diabetes mellitus with diabetic polyneuropathy: Secondary | ICD-10-CM | POA: Diagnosis not present

## 2022-08-12 DIAGNOSIS — Z90722 Acquired absence of ovaries, bilateral: Secondary | ICD-10-CM

## 2022-08-12 DIAGNOSIS — R531 Weakness: Secondary | ICD-10-CM | POA: Diagnosis present

## 2022-08-12 DIAGNOSIS — G4700 Insomnia, unspecified: Secondary | ICD-10-CM | POA: Diagnosis present

## 2022-08-12 DIAGNOSIS — F32A Depression, unspecified: Secondary | ICD-10-CM | POA: Diagnosis not present

## 2022-08-12 DIAGNOSIS — E1122 Type 2 diabetes mellitus with diabetic chronic kidney disease: Secondary | ICD-10-CM | POA: Diagnosis present

## 2022-08-12 DIAGNOSIS — R413 Other amnesia: Secondary | ICD-10-CM

## 2022-08-12 DIAGNOSIS — L89623 Pressure ulcer of left heel, stage 3: Secondary | ICD-10-CM | POA: Diagnosis not present

## 2022-08-12 DIAGNOSIS — Z8542 Personal history of malignant neoplasm of other parts of uterus: Secondary | ICD-10-CM

## 2022-08-12 DIAGNOSIS — E1140 Type 2 diabetes mellitus with diabetic neuropathy, unspecified: Secondary | ICD-10-CM | POA: Diagnosis not present

## 2022-08-12 DIAGNOSIS — Z794 Long term (current) use of insulin: Secondary | ICD-10-CM | POA: Diagnosis not present

## 2022-08-12 DIAGNOSIS — Z9049 Acquired absence of other specified parts of digestive tract: Secondary | ICD-10-CM

## 2022-08-12 DIAGNOSIS — R5381 Other malaise: Secondary | ICD-10-CM | POA: Diagnosis not present

## 2022-08-12 DIAGNOSIS — I129 Hypertensive chronic kidney disease with stage 1 through stage 4 chronic kidney disease, or unspecified chronic kidney disease: Secondary | ICD-10-CM | POA: Diagnosis not present

## 2022-08-12 DIAGNOSIS — N183 Chronic kidney disease, stage 3 unspecified: Secondary | ICD-10-CM | POA: Diagnosis not present

## 2022-08-12 DIAGNOSIS — I63511 Cerebral infarction due to unspecified occlusion or stenosis of right middle cerebral artery: Secondary | ICD-10-CM | POA: Diagnosis not present

## 2022-08-12 DIAGNOSIS — I69998 Other sequelae following unspecified cerebrovascular disease: Secondary | ICD-10-CM | POA: Diagnosis not present

## 2022-08-12 DIAGNOSIS — S2231XD Fracture of one rib, right side, subsequent encounter for fracture with routine healing: Secondary | ICD-10-CM

## 2022-08-12 DIAGNOSIS — Z79899 Other long term (current) drug therapy: Secondary | ICD-10-CM

## 2022-08-12 DIAGNOSIS — N393 Stress incontinence (female) (male): Secondary | ICD-10-CM | POA: Diagnosis not present

## 2022-08-12 DIAGNOSIS — Z888 Allergy status to other drugs, medicaments and biological substances status: Secondary | ICD-10-CM

## 2022-08-12 DIAGNOSIS — Z882 Allergy status to sulfonamides status: Secondary | ICD-10-CM

## 2022-08-12 DIAGNOSIS — Z91148 Patient's other noncompliance with medication regimen for other reason: Secondary | ICD-10-CM

## 2022-08-12 DIAGNOSIS — E1165 Type 2 diabetes mellitus with hyperglycemia: Secondary | ICD-10-CM | POA: Diagnosis not present

## 2022-08-12 DIAGNOSIS — Z89411 Acquired absence of right great toe: Secondary | ICD-10-CM

## 2022-08-12 DIAGNOSIS — S91302A Unspecified open wound, left foot, initial encounter: Secondary | ICD-10-CM | POA: Diagnosis not present

## 2022-08-12 LAB — CBC
HCT: 28.7 % — ABNORMAL LOW (ref 36.0–46.0)
HCT: 31.5 % — ABNORMAL LOW (ref 36.0–46.0)
Hemoglobin: 9.5 g/dL — ABNORMAL LOW (ref 12.0–15.0)
Hemoglobin: 10.4 g/dL — ABNORMAL LOW (ref 12.0–15.0)
MCH: 32.5 pg (ref 26.0–34.0)
MCH: 33.2 pg (ref 26.0–34.0)
MCHC: 33.1 g/dL (ref 30.0–36.0)
MCHC: 33 g/dL (ref 30.0–36.0)
MCV: 98.3 fL (ref 80.0–100.0)
MCV: 100.6 fL — ABNORMAL HIGH (ref 80.0–100.0)
Platelets: 201 10*3/uL (ref 150–400)
Platelets: 337 10*3/uL (ref 150–400)
RBC: 2.92 MIL/uL — ABNORMAL LOW (ref 3.87–5.11)
RBC: 3.13 MIL/uL — ABNORMAL LOW (ref 3.87–5.11)
RDW: 13.3 % (ref 11.5–15.5)
RDW: 13.4 % (ref 11.5–15.5)
WBC: 4.9 10*3/uL (ref 4.0–10.5)
WBC: 6.1 10*3/uL (ref 4.0–10.5)
nRBC: 0 % (ref 0.0–0.2)
nRBC: 0 % (ref 0.0–0.2)

## 2022-08-12 LAB — BASIC METABOLIC PANEL
Anion gap: 7 (ref 5–15)
BUN: 26 mg/dL — ABNORMAL HIGH (ref 8–23)
CO2: 24 mmol/L (ref 22–32)
Calcium: 8.4 mg/dL — ABNORMAL LOW (ref 8.9–10.3)
Chloride: 107 mmol/L (ref 98–111)
Creatinine, Ser: 1.33 mg/dL — ABNORMAL HIGH (ref 0.44–1.00)
GFR, Estimated: 43 mL/min — ABNORMAL LOW (ref >60)
Glucose, Bld: 180 mg/dL — ABNORMAL HIGH (ref 70–99)
Potassium: 3.8 mmol/L (ref 3.5–5.1)
Sodium: 138 mmol/L (ref 135–145)

## 2022-08-12 LAB — GLUCOSE, CAPILLARY
Glucose-Capillary: 166 mg/dL — ABNORMAL HIGH (ref 70–99)
Glucose-Capillary: 218 mg/dL — ABNORMAL HIGH (ref 70–99)
Glucose-Capillary: 151 mg/dL — ABNORMAL HIGH (ref 70–99)

## 2022-08-12 LAB — CREATININE, SERUM
Creatinine, Ser: 1.39 mg/dL — ABNORMAL HIGH (ref 0.44–1.00)
GFR, Estimated: 41 mL/min — ABNORMAL LOW (ref >60)

## 2022-08-12 MED ORDER — CLOPIDOGREL BISULFATE 75 MG PO TABS: 75.0000 mg | ORAL_TABLET | Freq: Every day | ORAL | 0 refills | Status: DC

## 2022-08-12 MED ORDER — BUPROPION HCL ER (XL) 300 MG PO TB24
300.0000 mg | ORAL_TABLET | Freq: Every day | ORAL | Status: DC
  Administered 2022-08-13 – 2022-08-25 (×13): 300 mg via ORAL
  Filled 2022-08-12 (×13): qty 1

## 2022-08-12 MED ORDER — CARVEDILOL 6.25 MG PO TABS
6.2500 mg | ORAL_TABLET | Freq: Two times a day (BID) | ORAL | Status: DC
  Administered 2022-08-12 – 2022-08-25 (×26): 6.25 mg via ORAL
  Filled 2022-08-12 (×26): qty 1

## 2022-08-12 MED ORDER — OXYCODONE HCL 5 MG PO TABS
5.0000 mg | ORAL_TABLET | Freq: Four times a day (QID) | ORAL | Status: AC | PRN
  Administered 2022-08-16 – 2022-08-17 (×2): 5 mg via ORAL
  Filled 2022-08-12 (×2): qty 1

## 2022-08-12 MED ORDER — ATORVASTATIN CALCIUM 80 MG PO TABS: 80.0000 mg | ORAL_TABLET | Freq: Every day | ORAL | Status: DC

## 2022-08-12 MED ORDER — POLYETHYLENE GLYCOL 3350 17 G PO PACK: 17.0000 g | PACK | Freq: Every day | ORAL | Status: DC | PRN

## 2022-08-12 MED ORDER — ATORVASTATIN CALCIUM 80 MG PO TABS
80.0000 mg | ORAL_TABLET | Freq: Every day | ORAL | Status: DC
  Administered 2022-08-13 – 2022-08-25 (×13): 80 mg via ORAL
  Filled 2022-08-12 (×13): qty 1

## 2022-08-12 MED ORDER — ASPIRIN 81 MG PO TBEC: 81.0000 mg | DELAYED_RELEASE_TABLET | Freq: Every day | ORAL | Status: DC

## 2022-08-12 MED ORDER — MELATONIN 3 MG PO TABS
3.0000 mg | ORAL_TABLET | Freq: Every evening | ORAL | Status: DC | PRN
  Filled 2022-08-12: qty 1

## 2022-08-12 MED ORDER — ACETAMINOPHEN 325 MG PO TABS
650.0000 mg | ORAL_TABLET | Freq: Four times a day (QID) | ORAL | Status: DC | PRN
  Administered 2022-08-13 – 2022-08-20 (×5): 650 mg via ORAL
  Filled 2022-08-12 (×6): qty 2

## 2022-08-12 MED ORDER — CLOPIDOGREL BISULFATE 75 MG PO TABS
75.0000 mg | ORAL_TABLET | Freq: Every day | ORAL | Status: DC
  Administered 2022-08-13 – 2022-08-25 (×13): 75 mg via ORAL
  Filled 2022-08-12 (×13): qty 1

## 2022-08-12 MED ORDER — ASPIRIN 81 MG PO TBEC
81.0000 mg | DELAYED_RELEASE_TABLET | Freq: Every day | ORAL | Status: DC
  Administered 2022-08-13 – 2022-08-25 (×13): 81 mg via ORAL
  Filled 2022-08-12 (×13): qty 1

## 2022-08-12 MED ORDER — INSULIN GLARGINE-YFGN 100 UNIT/ML ~~LOC~~ SOLN
15.0000 [IU] | Freq: Every day | SUBCUTANEOUS | Status: DC
  Administered 2022-08-12 – 2022-08-13 (×2): 15 [IU] via SUBCUTANEOUS
  Filled 2022-08-12 (×3): qty 0.15

## 2022-08-12 MED ORDER — ENOXAPARIN SODIUM 40 MG/0.4ML IJ SOSY
40.0000 mg | PREFILLED_SYRINGE | INTRAMUSCULAR | Status: DC
  Administered 2022-08-13 – 2022-08-25 (×13): 40 mg via SUBCUTANEOUS
  Filled 2022-08-12 (×13): qty 0.4

## 2022-08-12 MED ORDER — LOSARTAN POTASSIUM 50 MG PO TABS: 100.0000 mg | ORAL_TABLET | Freq: Every day | ORAL | Status: DC

## 2022-08-12 MED ORDER — INSULIN ASPART 100 UNIT/ML IJ SOLN
0.0000 [IU] | Freq: Three times a day (TID) | INTRAMUSCULAR | Status: DC
  Administered 2022-08-12: 2 [IU] via SUBCUTANEOUS
  Administered 2022-08-13: 3 [IU] via SUBCUTANEOUS
  Administered 2022-08-13 – 2022-08-14 (×3): 2 [IU] via SUBCUTANEOUS
  Administered 2022-08-14 (×2): 3 [IU] via SUBCUTANEOUS
  Administered 2022-08-15 – 2022-08-17 (×7): 2 [IU] via SUBCUTANEOUS
  Administered 2022-08-17 – 2022-08-18 (×2): 1 [IU] via SUBCUTANEOUS
  Administered 2022-08-18: 3 [IU] via SUBCUTANEOUS
  Administered 2022-08-18: 1 [IU] via SUBCUTANEOUS
  Administered 2022-08-19: 3 [IU] via SUBCUTANEOUS
  Administered 2022-08-19: 1 [IU] via SUBCUTANEOUS
  Administered 2022-08-19 – 2022-08-20 (×2): 2 [IU] via SUBCUTANEOUS
  Administered 2022-08-20: 1 [IU] via SUBCUTANEOUS
  Administered 2022-08-20 – 2022-08-21 (×2): 3 [IU] via SUBCUTANEOUS
  Administered 2022-08-21 – 2022-08-22 (×4): 2 [IU] via SUBCUTANEOUS
  Administered 2022-08-23: 3 [IU] via SUBCUTANEOUS
  Administered 2022-08-23: 2 [IU] via SUBCUTANEOUS
  Administered 2022-08-23 – 2022-08-24 (×3): 1 [IU] via SUBCUTANEOUS
  Administered 2022-08-24: 2 [IU] via SUBCUTANEOUS

## 2022-08-12 MED ORDER — MUPIROCIN CALCIUM 2 % EX CREA
TOPICAL_CREAM | Freq: Every day | CUTANEOUS | Status: DC
  Filled 2022-08-12: qty 15

## 2022-08-12 MED ORDER — ESCITALOPRAM OXALATE 10 MG PO TABS
20.0000 mg | ORAL_TABLET | Freq: Every day | ORAL | Status: DC
  Administered 2022-08-13 – 2022-08-25 (×13): 20 mg via ORAL
  Filled 2022-08-12 (×13): qty 2

## 2022-08-12 MED ORDER — LOSARTAN POTASSIUM 50 MG PO TABS
50.0000 mg | ORAL_TABLET | Freq: Every morning | ORAL | Status: DC
  Administered 2022-08-13 – 2022-08-19 (×7): 50 mg via ORAL
  Filled 2022-08-12 (×7): qty 1

## 2022-08-12 MED ORDER — TAMOXIFEN CITRATE 10 MG PO TABS
20.0000 mg | ORAL_TABLET | Freq: Every day | ORAL | Status: DC
  Administered 2022-08-12 – 2022-08-25 (×14): 20 mg via ORAL
  Filled 2022-08-12 (×15): qty 2

## 2022-08-12 NOTE — Progress Notes (Signed)
Holly Sheriff, DO  Physician Physical Medicine and Rehabilitation   PMR Pre-admission    Signed   Date of Service: 08/11/2022  9:24 AM   Signed      Show:Clear all [x] Written[x] Templated[x] Copied  Added by: [x] Standley Brooking, RN  [] Hover for details PMR Admission Coordinator Pre-Admission Assessment   Patient: [name] is an 72 y.o., female MRN: [id number] DOB: 1950-05-18 Height: 5' 9.5" (176.5 cm) Weight: 108.2 kg                                                                                                                                                  Insurance Information HMO:     PPO: yes     PCP:      IPA:      80/20:      OTHER:  PRIMARY: [employer]      Policy#: [id number]      Subscriber: pt CM Name: Junious Dresser      Phone#: [phone]     Fax#: [phone] Pre-Cert#: [id number] approved for 7 days     Employer:  Benefits:  Phone #: [phone]     Name: 5/22 Eff. Date: 03/21/22     Deduct: none      Out of Pocket Max: $3200  CIR: $295 co pay per day days 1 until 6      SNF: no co pay per day days 1 until 20; $203 co co pay per day days 21 until 100 Outpatient: $15 per visit     Co-Pay: visits per medical neccesity Home Health: 100%      Co-Pay: visits per medical neccesity DME: 80%     Co-Pay: 20% Providers: in network  SECONDARY: none   Financial Counselor:       Phone#:    The Data processing manager" for patients in Inpatient Rehabilitation Facilities with attached "Privacy Act Statement-Health Care Records" was provided and verbally reviewed with: Patient   Emergency Contact Information Contact Information       Name Relation Home Work Mobile    [name] Sister     [phone]         Current Medical History  Patient Admitting Diagnosis: CVA   History of Present Illness: 72 year old right-handed female with a history of 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  Presented 08/08/2022 with progressive weakness and recurrent falls.  Per her sister after most recent fall patient sat on the floor for almost 10 hours and could not get up without assistance and was found covered in urine and feces..     Cranial CT scan showed a 16 x 5 mm infarct within the left corona radiata/caudate nucleus,  new from prior brain MRI 06/05/2019 but otherwise age-indeterminate.  No acute posttraumatic intracranial findings.  Redemonstrated chronic cortically based infarcts within bilateral frontal and left parietal lobes.  CT cervical spine negative for fracture or dislocation.  MRI multifocal bilateral acute ischemia, predominantly affecting the deep gray matter.  There was also 2 cortical foci within the right MCA territory.  No hemorrhage or mass effect.  Old bilateral MCA territory infarcts. Chest X RAY suspect acute fracture right eighth rib. Patient did not receive TNK.  As well as swelling of the lower extremities however patient had not been taking her Lasix for several months. MRA of the head was unremarkable.  Admission chemistries unremarkable except sodium 134 glucose 172 BUN 45 creatinine 2.29, WBC 14,800, CK 1265, urine culture greater than 100,000 E. coli, hemoglobin A1c 7.0.  Carotid Dopplers with left 40 to 59% ICA stenosis.  Echocardiogram ejection fraction of 60 to 65% no wall motion abnormality grade 1 diastolic dysfunction.  Neurology follow-up patient placed on low-dose aspirin and Plavix for CVA prophylaxis x 3 weeks then aspirin alone.  Recommendations of 30-day cardiac event monitor Lovenox added for DVT prophylaxis.  Placed on course of intravenous Rocephin for UTI.  WOC consulted for left foot wound followed by podiatry Dr. Ardelle Anton and recently did see the patient 5/13 and debrided the wound .Follow-up hemoglobin  08/11/2022 of 8.3 that has steadily declined from 12.52 weeks prior with no visible signs of bleeding initial concern for possible relation to hemoconcentration his WBC and platelets have also dropped.  FOBT pending and anemia panel.  His follow-up CBC the following day had improved to 9.5.   Glasgow Coma Scale Score: 15   Patient's medical record from [facility] has been reviewed by the rehabilitation admission coordinator and physician.   Past Medical History      Past Medical History:  Diagnosis Date   Breast cancer (HCC) 12/2019    left breast DCIS   CKD (chronic kidney disease) 06/07/2016    Stage 2, GFR 60-89 ml/min   Diabetic polyneuropathy 06/07/2016   Dyslipidemia     Endometrial adenocarcinoma 2012   Fever blister     Hyperlipidemia     Hypertension, essential 06/07/2016   Major depressive disorder     Mixed dyslipidemia 06/07/2016   MRSA infection 10/2019    left foot wound   Obstructive sleep apnea 06/07/2016    does not use CPAP   Peripheral neuropathy     Retinopathy     Severe obesity     Skin ulcer of left foot with fat layer exposed 11/02/2016   Stroke      Asymptomatic, discovered via neuroimaging; small infarct in left parietal lobe; also concern for small b/l frontal infarcts   Tachycardia 07/19/2017   Type 2 diabetes mellitus with hyperglycemia, with long-term current use of insulin 06/07/2016   Unilateral primary osteoarthritis, right knee 04/16/2019    Has the patient had major surgery during 100 days prior to admission? No   Family History  family history includes Alzheimer's disease in her father and mother; Bladder Cancer in her father; Breast cancer (age of onset: 39) in her paternal aunt; CAD in her father; Diverticulitis in her sister; Heart attack in her father; Hypertension in her mother and sister; Obesity in her sister; Prostate cancer in her paternal uncle; Voice disorder in her brother.   Current Medications    Current Facility-Administered  Medications:    0.9 %  sodium chloride infusion, , Intravenous, Continuous,  Noralee Stain, DO, Last Rate: 75 mL/hr at 08/12/22 4098, Infusion Verify at 08/12/22 1191   acetaminophen (TYLENOL) tablet 650 mg, 650 mg, Oral, Q6H PRN, Darlin Drop, DO, 650 mg at 08/09/22 2132   aspirin EC tablet 81 mg, 81 mg, Oral, Daily, Darlin Drop, DO, 81 mg at 08/12/22 4782   atorvastatin (LIPITOR) tablet 80 mg, 80 mg, Oral, Daily, Darlin Drop, DO, 80 mg at 08/12/22 9562   buPROPion (WELLBUTRIN XL) 24 hr tablet 300 mg, 300 mg, Oral, Daily, Noralee Stain, DO, 300 mg at 08/12/22 1308   carvedilol (COREG) tablet 6.25 mg, 6.25 mg, Oral, BID, Noralee Stain, DO, 6.25 mg at 08/12/22 6578   clopidogrel (PLAVIX) tablet 75 mg, 75 mg, Oral, Daily, Dow Adolph N, DO, 75 mg at 08/12/22 0832   enoxaparin (LOVENOX) injection 40 mg, 40 mg, Subcutaneous, Q24H, Narda Bonds, MD, 40 mg at 08/12/22 4696   escitalopram (LEXAPRO) tablet 20 mg, 20 mg, Oral, Daily, Noralee Stain, DO, 20 mg at 08/12/22 2952   HYDROmorphone (DILAUDID) injection 0.5 mg, 0.5 mg, Intravenous, Q4H PRN, Margo Aye, Carole N, DO   insulin aspart (novoLOG) injection 0-9 Units, 0-9 Units, Subcutaneous, TID WC, Noralee Stain, DO, 2 Units at 08/12/22 8413   insulin glargine-yfgn (SEMGLEE) injection 15 Units, 15 Units, Subcutaneous, QHS, Narda Bonds, MD, 15 Units at 08/11/22 2202   labetalol (NORMODYNE) injection 5 mg, 5 mg, Intravenous, Q2H PRN, Hall, Carole N, DO   losartan (COZAAR) tablet 50 mg, 50 mg, Oral, q morning, Noralee Stain, DO, 50 mg at 08/12/22 2440   melatonin tablet 3 mg, 3 mg, Oral, QHS PRN, Darlin Drop, DO   mupirocin cream (BACTROBAN) 2 %, , Topical, Daily, Narda Bonds, MD, Given at 08/12/22 [phone]   oxyCODONE (Oxy IR/ROXICODONE) immediate release tablet 5 mg, 5 mg, Oral, Q6H PRN, Margo Aye, Carole N, DO   polyethylene glycol (MIRALAX / GLYCOLAX) packet 17 g, 17 g, Oral, Daily PRN, Hall, Carole N, DO   prochlorperazine (COMPAZINE)  injection 5 mg, 5 mg, Intravenous, Q6H PRN, Dow Adolph N, DO   Patients Current Diet:  Diet Order                  Diet Carb Modified Fluid consistency: Thin; Room service appropriate? Yes  Diet effective now                       Precautions / Restrictions Precautions Precautions: Fall Restrictions Weight Bearing Restrictions: No    Has the patient had 2 or more falls or a fall with injury in the past year?Yes frequent falls in past week pta   Prior Activity Level Limited Community (1-2x/wk): Mod I with cane, drove very limited   Prior Functional Level Prior Function Prior Level of Function : Independent/Modified Independent, Driving Mobility Comments: walked with cane out of the home, no AD inside ADLs Comments: sits to shower   Self Care: Did the patient need help bathing, dressing, using the toilet or eating?  Independent   Indoor Mobility: Did the patient need assistance with walking from room to room (with or without device)? Independent   Stairs: Did the patient need assistance with internal or external stairs (with or without device)? Independent   Functional Cognition: Did the patient need help planning regular tasks such as shopping or remembering to take medications? Independent   Patient Information Are you of Hispanic, Latino/a,or Spanish origin?: A. No, not of Hispanic, Latino/a, or Spanish origin  What is your race?: A. White Do you need or want an interpreter to communicate with a doctor or health care staff?: 0. No   Patient's Response To:  Health Literacy and Transportation Is the patient able to respond to health literacy and transportation needs?: Yes Health Literacy - How often do you need to have someone help you when you read instructions, pamphlets, or other written material from your doctor or pharmacy?: Never In the past 12 months, has lack of transportation kept you from medical appointments or from getting medications?: No In the past 12  months, has lack of transportation kept you from meetings, work, or from getting things needed for daily living?: No   Journalist, newspaper / Equipment Home Assistive Devices/Equipment: None Home Equipment: Cane - single point, Agricultural consultant (2 wheels), Shower seat, Hand held shower head   Prior Device Use: Indicate devices/aids used by the patient prior to current illness, exacerbation or injury?  Cane outside the home   Current Functional Level Cognition   Overall Cognitive Status: No family/caregiver present to determine baseline cognitive functioning Orientation Level: Oriented X4 General Comments: some STM deficits vs decreased attention?    Extremity Assessment (includes Sensation/Coordination)   Upper Extremity Assessment: Overall WFL for tasks assessed  Lower Extremity Assessment: Defer to PT evaluation     ADLs   Overall ADL's : Needs assistance/impaired Eating/Feeding: Independent Grooming: Set up, Sitting Upper Body Bathing: Minimal assistance, Sitting Lower Body Bathing: Total assistance, +2 for physical assistance, Sit to/from stand Upper Body Dressing : Minimal assistance, Sitting Lower Body Dressing: Total assistance, Bed level Toilet Transfer: Minimal assistance, Stand-pivot, Rolling walker (2 wheels) Toilet Transfer Details (indicate cue type and reason): simulated with bed to chair. assist to steady and to stabilize walker as pt pulling up on walker despite hand placement cues. Extra time and effort Functional mobility during ADLs: +2 for physical assistance, Minimal assistance     Mobility   Overal bed mobility: Needs Assistance Bed Mobility: Supine to Sit Rolling: Min assist Sidelying to sit: HOB elevated, Min assist Supine to sit: Min assist General bed mobility comments: pt coming to longsitting position then pivoting hips to EOB position. min Assist to stabilize during trunk elevation. extra time and effort.     Transfers   Overall transfer level:  Needs assistance Equipment used: Rolling walker (2 wheels), None Transfers: Sit to/from Stand Sit to Stand: Min assist General transfer comment: to/from elevated EOB and recliner. assist to steady walker and control descent     Ambulation / Gait / Stairs / Wheelchair Mobility   Ambulation/Gait Ambulation/Gait assistance: Mod assist, Min assist Gait Distance (Feet):  (10 ft + 10 ft without AD with mod assist, 25 ft + 10 ft + 10 ft with RW & CGA) Assistive device: Rolling walker (2 wheels) Gait Pattern/deviations: Decreased step length - right, Decreased step length - left, Decreased stride length, Decreased dorsiflexion - right, Decreased dorsiflexion - left General Gait Details: Decreased heel strike BLE, LLE knee flexed throughout (no knee extension in stance phase but no buckling noted). PT provides cuing for increased heel strike but poor return demo. Gait velocity: decreased. Gait velocity interpretation: <1.31 ft/sec, indicative of household ambulator     Posture / Balance Balance Overall balance assessment: Needs assistance Sitting-balance support: No upper extremity supported, Feet supported Sitting balance-Leahy Scale: Good Standing balance support: During functional activity, No upper extremity supported Standing balance-Leahy Scale: Poor Standing balance comment: Reliant on at least 1 UE for support to  wash hands at sink, RW to ambulate. Increased flexion of knees and trunk as time in standing and pt fatigue increased.     Special needs/care consideration Fall precautions     Leanna Battles, RN  Registered Nurse WOC   Consult Note    Addendum   Date of Service: 08/11/2022  7:48 AM     Show:Clear all [x] Written[x] Templated[x] Copied   Added by: [x] Leanna Battles, RN   [] Hover for details WOC Nurse Consult Note: Reason for Consult: Consult requested for left foot wound.  Performed remotely after review of progress notes and a photo in the EMR.   Pt is followed by  Dr Ardelle Anton of the podiatry team prior to admission. He recently saw the patient on 5/13 in the office and debrided the wound.  He has ordered antibiotic ointment.  Left plantar foot with chronic full thickness wound; 1.5X.6X.3cm, according to podiatry notes, 90% red, 10% yellow with dry callous edges.  Dressing procedure/placement/frequency: Continue present plan of care as previously ordered; topical treatment orders provided for bedside nurses to perform: Apply Bactroban to left foot wound Q day, then cover with foam dressing.  Change foam dressing Q 3 days or PRN soiling. Pt should continue follow-up with podiatry team after discharge.  Please re-consult if further assistance is needed.  Mardee Postin MSN, RN, CWOCN, CWCN-AP, [place] [phone]             Revision History  Bilateral groin rash, buttocks abrasion, ecchymosis. Reports skin tear near anus causing irritation and bleeding since fall      Previous Home Environment  Living Arrangements: Alone  Lives With: Alone Available Help at Discharge: Family, Available 24 hours/day (will go live with sister who can do 24/7 assist in her home) Type of Home: House Home Layout: One level Home Access: Stairs to enter Entrance Stairs-Rails: Right, Left Entrance Stairs-Number of Steps: 5 Bathroom Shower/Tub: Health visitor: Handicapped height Bathroom Accessibility: Yes How Accessible: Accessible via walker Home Care Services: No   Discharge Living Setting Plans for Discharge Living Setting: Lives with (comment) (to live with sister, [name] and her brother in law at discharge) Type of Home at Discharge: House Discharge Home Layout: Two level, Able to live on main level with bedroom/bathroom Discharge Home Access: Stairs to enter Entrance Stairs-Rails: Right, Left Entrance Stairs-Number of Steps: 5 Discharge Bathroom Shower/Tub: Tub/shower unit, Walk-in shower (has access to both downstairs) Discharge Bathroom  Toilet: Standard Discharge Bathroom Accessibility: Yes How Accessible: Accessible via walker Does the patient have any problems obtaining your medications?: No   Social/Family/Support Systems Contact Information: sister, [name] and she is also her POA Anticipated Caregiver: [name], sister Anticipated Caregiver's Contact Information: see contacts Ability/Limitations of Caregiver: none Caregiver Availability: 24/7 Discharge Plan Discussed with Primary Caregiver: Yes Is Caregiver In Agreement with Plan?: Yes Does Caregiver/Family have Issues with Lodging/Transportation while Pt is in Rehab?: Yes   Goals Patient/Family Goal for Rehab: supervision with PT, supervision to min OT, supervision SLP Expected length of stay: ELOS 10 to 14 days Additional Information: To go stay with sister at discharge Pt/Family Agrees to Admission and willing to participate: Yes Program Orientation Provided & Reviewed with Pt/Caregiver Including Roles  & Responsibilities: Yes   Decrease burden of Care through IP rehab admission: n/a   Possible need for SNF placement upon discharge:not anticipated   Patient Condition: This patient's condition remains as documented in the consult dated 08/09/22, in which the Rehabilitation Physician determined and documented that the  patient's condition is appropriate for intensive rehabilitative care in an inpatient rehabilitation facility. Will admit to inpatient rehab today.   Preadmission Screen Completed By:  Clois Dupes, RN MSN 08/12/2022 11:30 AM ______________________________________________________________________   Discussed status with Dr. Shearon Stalls on 08/12/22 at 1130 and received approval for admission today.   Admission Coordinator:  Worthy Keeler MSN time [id number] Date 08/12/22            Revision History

## 2022-08-12 NOTE — H&P (Signed)
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  Per chart review patient lives alone.  1 level home 5 steps to entry.  Independent prior to admission but did use a cane when outside of the home.  Presented 08/08/2022 with progressive weakness and recurrent falls.  Per her sister after most recent fall patient sat on the floor for almost 10 hours and could not get up without assistance and was found covered in urine and feces..  Cranial CT scan showed a 16 x 5 mm infarct within the left corona radiata/caudate nucleus, new from prior brain MRI 06/05/2019 but otherwise age-indeterminate.  No acute posttraumatic intracranial findings.  Redemonstrated chronic cortically based infarcts within bilateral frontal and left parietal lobes.  CT cervical spine negative for fracture or dislocation.  MRI multifocal bilateral acute ischemia, predominantly affecting the deep gray matter.  There was also 2 cortical foci within the right MCA territory.  No hemorrhage or mass effect.  Old bilateral MCA territory infarcts. Chest X RAY suspect acute fracture right eighth rib. Patient did not receive TNK.  As well as swelling of the lower extremities however patient had not been taking her Lasix for several months. MRA of the head was unremarkable.  Admission chemistries unremarkable except sodium 134 glucose 172 BUN 45 creatinine 2.29, WBC 14,800, CK 1265, urine culture greater than 100,000 E. coli, hemoglobin A1c 7.0.  Carotid Dopplers with left 40 to 59% ICA stenosis.  Echocardiogram ejection fraction of 60 to 65% no wall motion abnormality grade 1 diastolic  dysfunction.  Neurology follow-up patient placed on low-dose aspirin and Plavix for CVA prophylaxis x 3 weeks then aspirin alone.  Recommendations of 30-day cardiac event monitor Lovenox added for DVT prophylaxis.  Placed on course of intravenous Rocephin for UTI.  Follow-up hemoglobin 08/11/2022 of 8.3 that has steadily declined from 12.52 weeks prior with no visible signs of bleeding initial concern for possible relation to hemoconcentration his WBC and platelets have also dropped.  FOBT pending and anemia panel.  His follow-up CBC the following day had improved to 9.5.  WOC consulted for left foot wound followed by podiatry Dr. Ardelle Anton and recently did see the patient 5/13 and debrided the wound as well as pressure injury right/left buttocks.  Therapy evaluations completed due to patient decreased functional mobility was admitted for a comprehensive rehab program.   Review of Systems  Constitutional:  Negative for chills and fever.  HENT:  Negative for hearing loss.   Eyes:  Negative for blurred vision and double vision.  Respiratory:  Negative for cough, shortness of breath and wheezing.   Cardiovascular:  Negative for chest pain and palpitations.  Gastrointestinal:  Positive for constipation. Negative for heartburn, nausea and vomiting.  Genitourinary:  Negative for dysuria, flank pain and hematuria.  Musculoskeletal:  Positive for falls, joint pain and myalgias.  Skin:  Negative for rash.  Neurological:  Positive for weakness.  Psychiatric/Behavioral:  Positive for depression. The patient has insomnia.   All other systems reviewed and are negative.       Past Medical History:  Diagnosis Date   Breast cancer (HCC) 12/2019  left breast DCIS   CKD (chronic kidney disease) 06/07/2016    Stage 2, GFR 60-89 ml/min   Diabetic polyneuropathy 06/07/2016   Dyslipidemia     Endometrial adenocarcinoma 2012   Fever blister     Hyperlipidemia     Hypertension, essential 06/07/2016   Major  depressive disorder     Mixed dyslipidemia 06/07/2016   MRSA infection 10/2019    left foot wound   Obstructive sleep apnea 06/07/2016    does not use CPAP   Peripheral neuropathy     Retinopathy     Severe obesity     Skin ulcer of left foot with fat layer exposed 11/02/2016   Stroke      Asymptomatic, discovered via neuroimaging; small infarct in left parietal lobe; also concern for small b/l frontal infarcts   Tachycardia 07/19/2017   Type 2 diabetes mellitus with hyperglycemia, with long-term current use of insulin 06/07/2016   Unilateral primary osteoarthritis, right knee 04/16/2019         Past Surgical History:  Procedure Laterality Date   ADRENALECTOMY       BREAST LUMPECTOMY WITH RADIOACTIVE SEED LOCALIZATION Left 02/25/2020    Procedure: LEFT BREAST LUMPECTOMY WITH RADIOACTIVE SEED LOCALIZATION;  Surgeon: Abigail Miyamoto, MD;  Location: [facility];  Service: General;  Laterality: Left;   BUNIONECTOMY       CARDIAC CATHETERIZATION       CERVICAL SPINE SURGERY       CHOLECYSTECTOMY       DILATION AND CURETTAGE OF UTERUS       LAPAROSCOPIC SALPINGOOPHERECTOMY       THORACOTOMY       TONSILLECTOMY             Family History  Problem Relation Age of Onset   Hypertension Mother     Alzheimer's disease Mother     Heart attack Father     CAD Father     Alzheimer's disease Father     Bladder Cancer Father          dx late 21s   Diverticulitis Sister     Obesity Sister     Hypertension Sister     Voice disorder Brother     Breast cancer Paternal Aunt 72   Prostate cancer Paternal Uncle          dx mid 49s    Social History:  reports that she has never smoked. She has never used smokeless tobacco. She reports that she does not drink alcohol and does not use drugs. Allergies:       Allergies  Allergen Reactions   Amitriptyline Other (See Comments)   Ciprofibrate Other (See Comments)   Ciprofloxacin Other (See Comments)   Pristiq [Desvenlafaxine] Other  (See Comments)   Sulfur        Other Reaction(s): Unknown   Penicillins Rash   Sulfa Antibiotics Rash   Sulfamethoxazole Rash          Medications Prior to Admission  Medication Sig Dispense Refill   buPROPion (WELLBUTRIN XL) 300 MG 24 hr tablet Take 300 mg by mouth daily.       carvedilol (COREG) 6.25 MG tablet Take 6.25 mg by mouth 2 (two) times daily.       doxycycline (VIBRAMYCIN) 100 MG capsule Take 1 capsule (100 mg total) by mouth 2 (two) times daily. 20 capsule 0   escitalopram (LEXAPRO) 20 MG tablet Take 20 mg by mouth daily.  FLUoxetine (PROZAC) 40 MG capsule Take 40 mg by mouth daily.    0   furosemide (LASIX) 40 MG tablet Take 40 mg by mouth daily as needed.       insulin NPH-regular Human (NOVOLIN 70/30) (70-30) 100 UNIT/ML injection Inject into the skin. 24 ml in the morning and 20 ml in the evening       losartan (COZAAR) 50 MG tablet Take 50 mg by mouth every morning.       metFORMIN (GLUCOPHAGE-XR) 500 MG 24 hr tablet Take 1,000 mg by mouth in the morning and at bedtime. PT TAKES 2000 MG DAILY       MONOJECT INSULIN SYRINGE 31G X 5/16" 1 ML MISC See admin instructions.       mupirocin ointment (BACTROBAN) 2 % Apply 1 application topically daily.       nutrition supplement, JUVEN, (JUVEN) PACK Take 1 packet by mouth 2 (two) times daily between meals. 30 packet 2   nystatin cream (MYCOSTATIN) 1 APPLICATION TWICE A DAY EXTERNALLY       ONETOUCH VERIO test strip         tamoxifen (NOLVADEX) 20 MG tablet TAKE ONE TABLET BY MOUTH EVERY MORNING 90 tablet 0          Home: Home Living Family/patient expects to be discharged to:: Private residence Living Arrangements: Alone Available Help at Discharge: Family, Available 24 hours/day (will go live with sister who can do 24/7 assist in her home) Type of Home: House Home Access: Stairs to enter [employer] of Steps: 5 Entrance Stairs-Rails: Right, Left Home Layout: One level Bathroom Shower/Tub: Architectural technologist: Handicapped height Bathroom Accessibility: Yes Home Equipment: Cane - single point, Agricultural consultant (2 wheels), Shower seat, Hand held shower head  Lives With: Alone   Functional History: Prior Function Prior Level of Function : Independent/Modified Independent, Driving Mobility Comments: walked with cane out of the home, no AD inside ADLs Comments: sits to shower   Functional Status:  Mobility: Bed Mobility Overal bed mobility: Needs Assistance Bed Mobility: Supine to Sit Rolling: Min assist Sidelying to sit: HOB elevated, Min assist Supine to sit: Min assist General bed mobility comments: pt coming to longsitting position then pivoting hips to EOB position. min Assist to stabilize during trunk elevation. extra time and effort. Transfers Overall transfer level: Needs assistance Equipment used: Rolling walker (2 wheels), None Transfers: Sit to/from Stand Sit to Stand: Min assist General transfer comment: to/from elevated EOB and recliner. assist to steady walker and control descent Ambulation/Gait Ambulation/Gait assistance: Mod assist, Min assist Gait Distance (Feet):  (10 ft + 10 ft without AD with mod assist, 25 ft + 10 ft + 10 ft with RW & CGA) Assistive device: Rolling walker (2 wheels) Gait Pattern/deviations: Decreased step length - right, Decreased step length - left, Decreased stride length, Decreased dorsiflexion - right, Decreased dorsiflexion - left General Gait Details: Decreased heel strike BLE, LLE knee flexed throughout (no knee extension in stance phase but no buckling noted). PT provides cuing for increased heel strike but poor return demo. Gait velocity: decreased. Gait velocity interpretation: <1.31 ft/sec, indicative of household ambulator   ADL: ADL Overall ADL's : Needs assistance/impaired Eating/Feeding: Independent Grooming: Set up, Sitting Upper Body Bathing: Minimal assistance, Sitting Lower Body Bathing: Total assistance, +2  for physical assistance, Sit to/from stand Upper Body Dressing : Minimal assistance, Sitting Lower Body Dressing: Total assistance, Bed level Toilet Transfer: Minimal assistance, Stand-pivot, Rolling walker (2 wheels) Toilet Transfer  Details (indicate cue type and reason): simulated with bed to chair. assist to steady and to stabilize walker as pt pulling up on walker despite hand placement cues. Extra time and effort Functional mobility during ADLs: +2 for physical assistance, Minimal assistance   Cognition: Cognition Overall Cognitive Status: No family/caregiver present to determine baseline cognitive functioning Orientation Level: Oriented X4 Cognition Arousal/Alertness: Awake/alert Behavior During Therapy: Flat affect Overall Cognitive Status: No family/caregiver present to determine baseline cognitive functioning General Comments: some STM deficits vs decreased attention?   Physical Exam: Blood pressure (!) 217/59, pulse 61, temperature 97.8 F (36.6 C), temperature source Oral, resp. rate 17, height 5' 9.5" (1.765 m), weight 108.2 kg, SpO2 100 %.  Constitutional: No apparent distress. Appropriate appearance for age. + Obese HENT: No JVD. Neck Supple. Trachea midline. Atraumatic, normocephalic. Eyes: PERRLA. EOMI. Visual fields grossly intact.  Cardiovascular: RRR, no murmurs/rub/gallops. 2+ bilateral LE pitting edema, 1+ bilateral upper extremity.  E refill brisk.   Respiratory: CTAB. No rales, rhonchi, or wheezing. On RA.  Abdomen: + bowel sounds, normoactive. No distention or tenderness.  GU: Not examined. + purwick, draining clear urine.  Skin: C/D/I. No apparent lesions.  Scattered bruising on bilateral lower extremities  + left upper extremity IV, clean dry intact. + Wound at base of left first MTP; linear, unable to appreciate depth, no apparent drainage or discharge. + Sacral skin tear, with clean visible base, scant bright red blood  MSK:      + Right first toe  amputation; + left first toe severe medial deviation      Strength:            5- out of 5 strength in bilateral upper extremities throughout            Bilateral lower extremities 3 out of 5 proximally, 4 out of 5 distally  Neurologic exam:  Cognition: AAO to person, place, time and event.  Language: Fluent, No substitutions or neoglisms. No dysarthria. Names 3/3 objects correctly.  Memory: Recalls 3/3 objects at 5 minutes. No apparent deficits  Insight: Good  insight into current condition.  Mood: Pleasant affect, appropriate mood.  Sensation: To light touch intact in BL Ues intact; bilateral lower extremities absent distally extending to the knees Reflexes: 2+ in BL UE and LEs. Negative Hoffman's and babinski signs bilaterally.  CN: 2-12 grossly intact.  Coordination: No apparent tremors. No ataxia on FTN, HTS bilaterally.  Spasticity: MAS 0 in all extremities.         Lab Results Last 48 Hours        Results for orders placed or performed during the hospital encounter of 08/08/22 (from the past 48 hour(s))  Glucose, capillary     Status: Abnormal    Collection Time: 08/10/22 12:09 PM  Result Value Ref Range    Glucose-Capillary 265 (H) 70 - 99 mg/dL      Comment: Glucose reference range applies only to samples taken after fasting for at least 8 hours.  Glucose, capillary     Status: Abnormal    Collection Time: 08/10/22  4:22 PM  Result Value Ref Range    Glucose-Capillary 228 (H) 70 - 99 mg/dL      Comment: Glucose reference range applies only to samples taken after fasting for at least 8 hours.  Glucose, capillary     Status: Abnormal    Collection Time: 08/10/22 10:25 PM  Result Value Ref Range    Glucose-Capillary 162 (H) 70 - 99 mg/dL  Comment: Glucose reference range applies only to samples taken after fasting for at least 8 hours.  CBC     Status: Abnormal    Collection Time: 08/11/22  1:36 AM  Result Value Ref Range    WBC 5.5 4.0 - 10.5 K/uL    RBC 2.53 (L)  3.87 - 5.11 MIL/uL    Hemoglobin 8.3 (L) 12.0 - 15.0 g/dL    HCT 16.1 (L) 09.6 - 46.0 %    MCV 97.2 80.0 - 100.0 fL    MCH 32.8 26.0 - 34.0 pg    MCHC 33.7 30.0 - 36.0 g/dL    RDW 04.5 40.9 - 81.1 %    Platelets 194 150 - 400 K/uL    nRBC 0.0 0.0 - 0.2 %      Comment: Performed at [facility], [address], [place], [place] [address]  Glucose, capillary     Status: Abnormal    Collection Time: 08/11/22  7:21 AM  Result Value Ref Range    Glucose-Capillary 104 (H) 70 - 99 mg/dL      Comment: Glucose reference range applies only to samples taken after fasting for at least 8 hours.  Glucose, capillary     Status: Abnormal    Collection Time: 08/11/22 11:57 AM  Result Value Ref Range    Glucose-Capillary 198 (H) 70 - 99 mg/dL      Comment: Glucose reference range applies only to samples taken after fasting for at least 8 hours.  Basic metabolic panel     Status: Abnormal    Collection Time: 08/11/22 12:30 PM  Result Value Ref Range    Sodium 135 135 - 145 mmol/L    Potassium 3.4 (L) 3.5 - 5.1 mmol/L    Chloride 104 98 - 111 mmol/L    CO2 23 22 - 32 mmol/L    Glucose, Bld 133 (H) 70 - 99 mg/dL      Comment: Glucose reference range applies only to samples taken after fasting for at least 8 hours.    BUN 27 (H) 8 - 23 mg/dL    Creatinine, Ser 2.95 (H) 0.44 - 1.00 mg/dL    Calcium 8.1 (L) 8.9 - 10.3 mg/dL    GFR, Estimated 40 (L) >60 mL/min      Comment: (NOTE) Calculated using the CKD-EPI Creatinine Equation (2021)      Anion gap 8 5 - 15      Comment: Performed at [facility], [address] [place], [place] [address]  Glucose, capillary     Status: Abnormal    Collection Time: 08/11/22  4:37 PM  Result Value Ref Range    Glucose-Capillary 197 (H) 70 - 99 mg/dL      Comment: Glucose reference range applies only to samples taken after fasting for at least 8 hours.  Glucose, capillary     Status: Abnormal    Collection Time: 08/11/22  9:20 PM  Result Value Ref  Range    Glucose-Capillary 219 (H) 70 - 99 mg/dL      Comment: Glucose reference range applies only to samples taken after fasting for at least 8 hours.  Glucose, capillary     Status: Abnormal    Collection Time: 08/12/22  8:16 AM  Result Value Ref Range    Glucose-Capillary 166 (H) 70 - 99 mg/dL      Comment: Glucose reference range applies only to samples taken after fasting for at least 8 hours.  Imaging Results (Last 48 hours)  No results found.         Blood pressure (!) 217/59, pulse 61, temperature 97.8 F (36.6 C), temperature source Oral, resp. rate 17, height 5' 9.5" (1.765 m), weight 108.2 kg, SpO2 100 %.   Medical Problem List and Plan: 1. Functional deficits secondary to right MCA territory infarcts and left subcortical lacunar infarct.  Recommendations for 30-day cardiac event monitor             -patient may shower             -ELOS/Goals: 10-14 days, SPV PT/OT/SLP 2.  Antithrombotics: -DVT/anticoagulation:  Pharmaceutical: Lovenox             -antiplatelet therapy: Aspirin 81 mg daily and Plavix 75 mg daily x 3 weeks then aspirin alone 3. Pain Management: Oxycodone as needed 4. Mood/Behavior/Sleep: Lexapro 20 mg daily, Wellbutrin 300 mg daily             -antipsychotic agents: N/A 5. Neuropsych/cognition: This patient is capable of making decisions on her own behalf. 6. Skin/Wound Care: Skin care as advised to left foot wound.  Routine skin checks..  Follow-up outpatient podiatry  - Mild sacral wound, small tear with minimal bleeding; cover and ensure no soiling  7. Fluids/Electrolytes/Nutrition: Routine in and outs with follow-up chemistries 8.  E. coli UTI.  Intravenous Rocephin 08/09/2022 9.  Hypertension.  Cozaar 50 mg daily, Coreg 6.25 mg twice daily.  Monitor with increased mobility 10.  Hyperlipidemia.  Lipitor 11.  Diabetes mellitus with peripheral neuropathy.  Hemoglobin A1c 7.0.  Currently on SSI and Semglee 15 units nightly.  Diabetic teaching.    Recent Labs    08/11/22 2120 08/12/22 0816 08/12/22 1145  GLUCAP 219* 166* 218*     12.  Obesity.  BMI 34.21.  Dietary follow-up 13.  AKI on CKD stage III.  Most recent creatinine 1.42 earlier this month.  Follow-up chemistries 14.  History of ductal carcinoma in situ of left breast.  Status postlumpectomy radioactive seed 2021.  Will discuss resuming Nolvadex.  Follow-up Dr. Pamelia Hoit 15.Right eighth rib fracture from fall.Conservative care  - Adding lidocaine patches to R chest 16.  Acute anemia.  Follow-up CBC.   17. Edema. TEDs (can cut out toes for wounds) and elevate legs in bed.  Given creatinine at baseline, consider addition of diuretic for edema if worsening.  Mcarthur Rossetti Angiulli, PA-C 08/12/2022  I have examined the patient independently and edited the note for HPI, ROS, exam, assessment, and plan as appropriate. I am in agreement with the above recommendations.   [name], DO 08/12/2022

## 2022-08-12 NOTE — Progress Notes (Signed)
   08/12/22 0813  Assess: MEWS Score  Temp 97.8 F (36.6 C)  BP (!) 217/59  MAP (mmHg) 103  Pulse Rate 61  Resp 17  SpO2 100 %  O2 Device Room Air  Assess: MEWS Score  MEWS Temp 0  MEWS Systolic 2  MEWS Pulse 0  MEWS RR 0  MEWS LOC 0  MEWS Score 2  MEWS Score Color Yellow  Assess: if the MEWS score is Yellow or Red  Were vital signs taken at a resting state? Yes  Focused Assessment No change from prior assessment  Does the patient meet 2 or more of the SIRS criteria? No  MEWS guidelines implemented  Yes, yellow  Treat  MEWS Interventions Considered administering scheduled or prn medications/treatments as ordered  Take Vital Signs  Increase Vital Sign Frequency  Yellow: Q2hr x1, continue Q4hrs until patient remains green for 12hrs  Escalate  MEWS: Escalate Yellow: Discuss with charge nurse and consider notifying provider and/or RRT  Notify: Charge Nurse/RN  Name of Charge Nurse/RN Notified [email or web address]  Assess: SIRS CRITERIA  SIRS Temperature  0  SIRS Pulse 0  SIRS Respirations  0  SIRS WBC 0  SIRS Score Sum  0

## 2022-08-12 NOTE — Discharge Summary (Signed)
Physician Discharge Summary   Patient: [name] MRN: [id number] DOB: Jul 10, 1950  Admit date:     08/08/2022  Discharge date: 08/12/22  Discharge Physician: Jacquelin Hawking, MD   PCP: Laurann Montana, MD   Recommendations at discharge:  PCP follow-up for hospital follow-up Neurology follow-up for stroke follow-up  Discharge Diagnoses: Principal Problem:   Cryptogenic stroke [facility]) Active Problems:   Diabetic peripheral neuropathy (HCC)   DM type 2 (diabetes mellitus, type 2) (HCC)   Reactive depression   Generalized weakness   Pressure injury of skin   UTI (urinary tract infection)   AKI (acute kidney injury) (HCC)   CKD stage 3a, GFR 45-59 ml/min (HCC)   Right rib fracture   Traumatic rhabdomyolysis (HCC)   Lower extremity edema  Resolved Problems:   * No resolved hospital problems. *  Hospital Course: [name] is a 72 y.o. female with a history of ductal carcinoma in situ of left breast, diabetes mellitus type 2, obesity, major depressive disorder, hypertension, peripheral neuropathy, retinopathy.  Patient presented secondary to progressive weakness with multiple falls.  Found to have evidence of acute multifocal strokes involving the right MCA territory and left subcortical lacunar areas. Hospitalization complicated by AKI which improved with IV fluids.  Assessment and Plan:  Acute cryptogenic stroke MRI brain confirms multifocal bilateral acute ischemia affecting the deep gray matter in addition to evidence of 2 cortical foci noted within the right MCA territory. MRA head normal. LDL of 58. Hemoglobin A1C of 7.0%. Transthoracic Echocardiogram significant for normal LVEF of 60 to 65% with mild to moderately dilated left atrium and no atrial level shunt detected.  Per neurology, cryptogenic embolism and small vessel disease or etiology for stroke.  Neurology recommendations for 30-day heart monitor at discharge to evaluate for paroxysmal atrial fibrillation, Plavix for  3 weeks and aspirin indefinitely with repeat carotid ultrasound in 6 to 12 months. PT/OT recommendations for acute inpatient rehabilitation. Recommendation for Neurology follow-up in 4 weeks.   Left carotid artery stenosis Mild.  Neurology recommendations for surveillance carotid ultrasound in 6 to 12 months.   UTI Urine culture significant for pan-sensitive E. coli. Patient started on ceftriaxone and has completed a 3 day course.   AKI on CKD stage IIIa Unclear baseline, although most recent creatinine of 1.42 earlier this month. From PCP office, BUN/Creatinine of 32/1.76 on 5/15 and 24/1.33 on 11/17/22. Creatinine of 2.29 on admission and improved back to baseline with IV fluids.   Diabetes mellitus type 2 Controlled with recent hemoglobin A1C of 7.0%. Patient is managed with NPH 70/30 24 units in the morning and 20 units in the evening as an outpatient. Patient started on SSI on admission and Semglee 50 units daily was added.  Patient to discharge back on home regimen.   Rib fracture Secondary to fall involving the eighth rib. Continue incentive spirometer and supportive care.   Primary hypertension Continue Coreg and losartan.  Increase to losartan 100 mg daily secondary to uncontrolled hypertension without symptoms.   Acute anemia From PCP office, Last hemoglobin was 12.9 from 05/13/2021. Hemoglobin of 12.1 on admission with acute drop of hemoglobin to 9.1 and now 8.6. No hemorrhaging noted per patient. Initially concern for possible relation to hemoconcentration as WBC and platelets have also dropped accordingly, however per nursing, patient has some bleeding from skin wounds on buttocks without evidence of hematochezia. Hemoglobin Appears to be stabilized. Patient has seen GI as an outpatient and was told she would no longer require colonoscopies.  Pressure injury Right/left buttocks. Present on admission.  Consultants: Neurology Procedures performed:  5/21: Transthoracic  Echocardiogram 5/21: Carotid ultrasound   Disposition: Acute inpatient rehabilitation Diet recommendation: Carb modified diet   DISCHARGE MEDICATION: Allergies as of 08/12/2022       Reactions   Elavil [amitriptyline] Other (See Comments)   Unknown reaction   Pristiq [desvenlafaxine] Other (See Comments)   Unknown allergy   Cipro [ciprofloxacin Hcl] Rash   Penicillins Rash   Sulfa Antibiotics Rash   Sulfamethoxazole Rash   Sulfur Rash        Medication List     STOP taking these medications    pravastatin 40 MG tablet Commonly known as: PRAVACHOL       TAKE these medications    aspirin EC 81 MG tablet Take 1 tablet (81 mg total) by mouth daily. Swallow whole. Start taking on: Aug 13, 2022   atorvastatin 80 MG tablet Commonly known as: LIPITOR Take 1 tablet (80 mg total) by mouth daily. Start taking on: Aug 13, 2022   buPROPion 300 MG 24 hr tablet Commonly known as: WELLBUTRIN XL Take 300 mg by mouth daily.   carvedilol 6.25 MG tablet Commonly known as: COREG Take 6.25 mg by mouth 2 (two) times daily.   clopidogrel 75 MG tablet Commonly known as: PLAVIX Take 1 tablet (75 mg total) by mouth daily for 18 days.   escitalopram 20 MG tablet Commonly known as: LEXAPRO Take 20 mg by mouth daily.   insulin NPH-regular Human (70-30) 100 UNIT/ML injection Inject 24-28 Units into the skin See admin instructions. 24 units in the morning, 28 units in the evening   losartan 50 MG tablet Commonly known as: COZAAR Take 2 tablets (100 mg total) by mouth daily. What changed: how much to take   metFORMIN 500 MG 24 hr tablet Commonly known as: GLUCOPHAGE-XR Take 1,000 mg by mouth in the morning and at bedtime.   nutrition supplement (JUVEN) Pack Take 1 packet by mouth 2 (two) times daily between meals.   tamoxifen 20 MG tablet Commonly known as: NOLVADEX TAKE ONE TABLET BY MOUTH EVERY MORNING What changed: when to take this        Follow-up Information      Micki Riley, MD Follow up in 4 week(s).   Specialties: Neurology, Radiology Why: For hospital follow-up Contact information: [address] [place] [address] [phone]         Laurann Montana, MD. Schedule an appointment as soon as possible for a visit.   Specialty: Family Medicine Why: For hospital follow-up Contact information: [address] [place] [address] [phone]                Discharge Exam: BP (!) 216/53 (BP Location: Right Arm)   Pulse 65   Temp 97.8 F (36.6 C) (Oral)   Resp 17   Ht 5' 9.5" (1.765 m)   Wt 108.2 kg   SpO2 100%   BMI 34.72 kg/m   General exam: Appears calm and comfortable Respiratory system: Clear to auscultation. Respiratory effort normal. Cardiovascular system: S1 & S2 heard, RRR. No murmurs, rubs, gallops or clicks. Gastrointestinal system: Abdomen is nondistended, soft and nontender. Normal bowel sounds heard. Central nervous system: Alert and oriented.  Bilateral upper extremity strength 5 out of 5.  Right lower extremity strength 5 out of 5.  Left lower extremity strength of 4 out of 5.  No cranial nerve deficits noted. Musculoskeletal: No edema.  No calf tenderness Skin: No cyanosis. No rashes Psychiatry: Judgement and insight appear normal. Mood & affect appropriate.   Condition at discharge: stable  The results of significant diagnostics from this hospitalization (including imaging, microbiology, ancillary and laboratory) are listed below for reference.   Imaging Studies: VAS US CAROTID  Result Date: 08/09/2022 Carotid Arterial Duplex Study Patient Name:  [facility]  Date of Exam:   08/09/2022 Medical Rec #: [id number]        Accession #:    [id number] Date of Birth: August 31, 1950        Patient Gender: F Patient Age:   66 years Exam Location:  [facility] Procedure:      VAS US CAROTID Referring Phys: Dow Adolph  --------------------------------------------------------------------------------  Indications:       CVA and Weakness. Risk Factors:      Hypertension, hyperlipidemia, Diabetes. Comparison Study:  No previous study. Performing Technologist: McKayla Maag RVT, [place]  Examination Guidelines: A complete evaluation includes B-mode imaging, spectral Doppler, color Doppler, and power Doppler as needed of all accessible portions of each vessel. Bilateral testing is considered an integral part of a complete examination. Limited examinations for reoccurring indications may be performed as noted.  Right Carotid Findings: +----------+--------+--------+--------+---------------------+------------------+           PSV cm/sEDV cm/sStenosisPlaque Description   Comments           +----------+--------+--------+--------+---------------------+------------------+ CCA Prox  78      9                                                       +----------+--------+--------+--------+---------------------+------------------+ CCA Distal64      7                                                       +----------+--------+--------+--------+---------------------+------------------+ ICA Prox  106     17      1-39%   homogeneous,         intimal thickening                                   irregular and                                                             calcific                                +----------+--------+--------+--------+---------------------+------------------+ ICA Mid   97      19                                                      +----------+--------+--------+--------+---------------------+------------------+ ICA Distal127     21                                                      +----------+--------+--------+--------+---------------------+------------------+  ECA       193     19                                                       +----------+--------+--------+--------+---------------------+------------------+ +----------+--------+-------+--------+-------------------+           PSV cm/sEDV cmsDescribeArm Pressure (mmHG) +----------+--------+-------+--------+-------------------+ [id number]            Stenotic                    +----------+--------+-------+--------+-------------------+ +---------+--------+--+--------+-+---------+ VertebralPSV cm/s32EDV cm/s7Antegrade +---------+--------+--+--------+-+---------+  Left Carotid Findings: +----------+--------+--------+--------+---------------------+------------------+           PSV cm/sEDV cm/sStenosisPlaque Description   Comments           +----------+--------+--------+--------+---------------------+------------------+ CCA Prox  107     9                                                       +----------+--------+--------+--------+---------------------+------------------+ CCA Distal69      13                                   intimal thickening +----------+--------+--------+--------+---------------------+------------------+ ICA Prox  129     18      40-59%  homogeneous,                                                              irregular and                                                             calcific                                +----------+--------+--------+--------+---------------------+------------------+ ICA Mid   120     13                                                      +----------+--------+--------+--------+---------------------+------------------+ ICA Distal114     22                                                      +----------+--------+--------+--------+---------------------+------------------+ ECA       107                                                             +----------+--------+--------+--------+---------------------+------------------+  +----------+--------+--------+----------------+-------------------+  PSV cm/sEDV cm/sDescribe        Arm Pressure (mmHG) +----------+--------+--------+----------------+-------------------+ [id number]             Multiphasic, WNL                    +----------+--------+--------+----------------+-------------------+ +---------+--------+--+--------+-+---------+ VertebralPSV cm/s49EDV cm/s9Antegrade +---------+--------+--+--------+-+---------+   Summary: Right Carotid: Velocities in the right ICA are consistent with a 1-39% stenosis. Left Carotid: Velocities in the left ICA are consistent with a 40-59% stenosis. Vertebrals:  Bilateral vertebral arteries demonstrate antegrade flow. Subclavians: Right subclavian artery was stenotic. Normal flow hemodynamics were              seen in the left subclavian artery. *See table(s) above for measurements and observations.  Electronically signed by Coral Else MD on 08/09/2022 at 7:15:45 PM.    Final    ECHOCARDIOGRAM COMPLETE BUBBLE STUDY  Result Date: 08/09/2022    ECHOCARDIOGRAM REPORT   Patient Name:   [facility] Date of Exam: 08/09/2022 Medical Rec #:  [id number]       Height:       69.5 in Accession #:    [id number]      Weight:       235.0 lb Date of Birth:  06-02-1950       BSA:          2.224 m Patient Age:    72 years        BP:           182/55 mmHg Patient Gender: F               HR:           80 bpm. Exam Location:  Inpatient Procedure: 2D Echo, Cardiac Doppler, Color Doppler and Saline Contrast Bubble            Study Indications:    stroke  History:        Patient has no prior history of Echocardiogram examinations.                 Stroke; Risk Factors:Hypertension, Diabetes and Dyslipidemia.  Sonographer:    Mike Gip Referring Phys: [id number] CAROLE N HALL IMPRESSIONS  1. Left ventricular ejection fraction, by estimation, is 60 to 65%. The left ventricle has normal function. The left ventricle has no regional wall motion  abnormalities. There is mild concentric left ventricular hypertrophy. Left ventricular diastolic parameters are consistent with Grade I diastolic dysfunction (impaired relaxation).  2. Right ventricular systolic function is normal. The right ventricular size is normal. Tricuspid regurgitation signal is inadequate for assessing PA pressure.  3. Left atrial size was mild to moderately dilated.  4. The mitral valve is normal in structure. No evidence of mitral valve regurgitation. No evidence of mitral stenosis.  5. The aortic valve is tricuspid. There is mild thickening of the aortic valve. Aortic valve regurgitation is not visualized. Aortic valve sclerosis is present, with no evidence of aortic valve stenosis.  6. The inferior vena cava is normal in size with greater than 50% respiratory variability, suggesting right atrial pressure of 3 mmHg.  7. Agitated saline contrast bubble study was negative, with no evidence of any interatrial shunt. FINDINGS  Left Ventricle: Left ventricular ejection fraction, by estimation, is 60 to 65%. The left ventricle has normal function. The left ventricle has no regional wall motion abnormalities. The left ventricular internal cavity size was normal in size. There is  mild concentric left ventricular hypertrophy. Left ventricular diastolic  parameters are consistent with Grade I diastolic dysfunction (impaired relaxation). Indeterminate filling pressures. Right Ventricle: The right ventricular size is normal. No increase in right ventricular wall thickness. Right ventricular systolic function is normal. Tricuspid regurgitation signal is inadequate for assessing PA pressure. The tricuspid regurgitant velocity is 2.58 m/s, and with an assumed right atrial pressure of 3 mmHg, the estimated right ventricular systolic pressure is 29.6 mmHg. Left Atrium: Left atrial size was mild to moderately dilated. Right Atrium: Right atrial size was normal in size. Pericardium: There is no evidence of  pericardial effusion. Mitral Valve: The mitral valve is normal in structure. Mild mitral annular calcification. No evidence of mitral valve regurgitation. No evidence of mitral valve stenosis. Tricuspid Valve: The tricuspid valve is normal in structure. Tricuspid valve regurgitation is trivial. No evidence of tricuspid stenosis. Aortic Valve: The aortic valve is tricuspid. There is mild thickening of the aortic valve. Aortic valve regurgitation is not visualized. Aortic valve sclerosis is present, with no evidence of aortic valve stenosis. Pulmonic Valve: The pulmonic valve was grossly normal. Pulmonic valve regurgitation is not visualized. No evidence of pulmonic stenosis. Aorta: The aortic root and ascending aorta are structurally normal, with no evidence of dilitation. Venous: The inferior vena cava is normal in size with greater than 50% respiratory variability, suggesting right atrial pressure of 3 mmHg. IAS/Shunts: No atrial level shunt detected by color flow Doppler. Agitated saline contrast was given intravenously to evaluate for intracardiac shunting. Agitated saline contrast bubble study was negative, with no evidence of any interatrial shunt.  LEFT VENTRICLE PLAX 2D LVIDd:         4.40 cm      Diastology LVIDs:         3.10 cm      LV e' medial:    6.20 cm/s LV PW:         1.30 cm      LV E/e' medial:  12.5 LV IVS:        1.30 cm      LV e' lateral:   7.72 cm/s LVOT diam:     2.10 cm      LV E/e' lateral: 10.1 LV SV:         99 LV SV Index:   44 LVOT Area:     3.46 cm  LV Volumes (MOD) LV vol d, MOD A2C: 120.0 ml LV vol d, MOD A4C: 126.0 ml LV vol s, MOD A2C: 51.4 ml LV vol s, MOD A4C: 48.0 ml LV SV MOD A2C:     68.6 ml LV SV MOD A4C:     126.0 ml LV SV MOD BP:      73.9 ml RIGHT VENTRICLE             IVC RV Basal diam:  3.70 cm     IVC diam: 2.00 cm RV S prime:     22.80 cm/s TAPSE (M-mode): 3.1 cm LEFT ATRIUM             Index        RIGHT ATRIUM           Index LA diam:        4.40 cm 1.98 cm/m   RA  Area:     17.30 cm LA Vol (A2C):   75.7 ml 34.04 ml/m  RA Volume:   43.90 ml  19.74 ml/m LA Vol (A4C):   68.0 ml 30.58 ml/m LA Biplane Vol: 74.0 ml 33.28 ml/m  AORTIC VALVE  LVOT Vmax:   112.00 cm/s LVOT Vmean:  78.900 cm/s LVOT VTI:    0.285 m  AORTA Ao Root diam: 3.20 cm Ao Asc diam:  3.50 cm MITRAL VALVE                TRICUSPID VALVE MV Area (PHT): 2.56 cm     TR Peak grad:   26.6 mmHg MV E velocity: 77.80 cm/s   TR Vmax:        258.00 cm/s MV A velocity: 117.00 cm/s MV E/A ratio:  0.66         SHUNTS                             Systemic VTI:  0.29 m                             Systemic Diam: 2.10 cm Rachelle Hora Croitoru MD Electronically signed by Thurmon Fair MD Signature Date/Time: 08/09/2022/1:54:07 PM    Final    MR ANGIO HEAD WO CONTRAST  Result Date: 08/09/2022 CLINICAL DATA:  Stroke follow-up EXAM: MRA HEAD WITHOUT CONTRAST TECHNIQUE: Angiographic images of the Circle of Willis were acquired using MRA technique without intravenous contrast. COMPARISON:  None Available. FINDINGS: POSTERIOR CIRCULATION: --Vertebral arteries: Normal --Inferior cerebellar arteries: Normal. --Basilar artery: Normal. --Superior cerebellar arteries: Normal. --Posterior cerebral arteries: Normal. ANTERIOR CIRCULATION: --Intracranial internal carotid arteries: Normal. --Anterior cerebral arteries (ACA): Normal. --Middle cerebral arteries (MCA): Normal. Anatomic variants: None Other: None. IMPRESSION: Normal intracranial MRA. Electronically Signed   By: Deatra Robinson M.D.   On: 08/09/2022 01:06   MR BRAIN WO CONTRAST  Result Date: 08/08/2022 CLINICAL DATA:  Altered mental status EXAM: MRI HEAD WITHOUT CONTRAST TECHNIQUE: Multiplanar, multiecho pulse sequences of the brain and surrounding structures were obtained without intravenous contrast. COMPARISON:  06/05/2019 FINDINGS: Brain: Multifocal bilateral acute ischemia, predominantly affecting the deep gray matter. There also 2 cortical foci within the right MCA territory.  There are old bilateral MCA territory infarcts. No acute or chronic hemorrhage. There is multifocal hyperintense T2-weighted signal within the white matter. Generalized volume loss. The midline structures are normal. Vascular: Major flow voids are preserved. Skull and upper cervical spine: Normal calvarium and skull base. Visualized upper cervical spine and soft tissues are normal. Sinuses/Orbits:No paranasal sinus fluid levels or advanced mucosal thickening. No mastoid or middle ear effusion. Normal orbits. IMPRESSION: 1. Multifocal bilateral acute ischemia, predominantly affecting the deep gray matter. There also 2 cortical foci within the right MCA territory. No hemorrhage or mass effect. 2. Old bilateral MCA territory infarcts. 3. Mild volume loss and chronic ischemic microangiopathy. Electronically Signed   By: Deatra Robinson M.D.   On: 08/08/2022 21:34   DG Ribs Unilateral W/Chest Right  Result Date: 08/08/2022 CLINICAL DATA:  De Hollingshead 3 times in the past week with right lower anterior rib pain EXAM: RIGHT RIBS AND CHEST - 3+ VIEW COMPARISON:  06/04/2010 FINDINGS: Suspected acute fracture of the anterior right eighth rib. There is no evidence of pneumothorax or pleural effusion. Both lungs are clear. Stable cardiomediastinal silhouette. Aortic atherosclerotic calcification. Cervical spine fusion hardware IMPRESSION: Suspected acute fracture of the anterior right eighth rib. Electronically Signed   By: Minerva Fester M.D.   On: 08/08/2022 20:14   CT Head Wo Contrast  Result Date: 08/08/2022 CLINICAL DATA:  Provided history: Polytrauma, blunt. Additional history provided: Fall. Patient found down. EXAM: CT HEAD  WITHOUT CONTRAST CT CERVICAL SPINE WITHOUT CONTRAST TECHNIQUE: Multidetector CT imaging of the head and cervical spine was performed following the standard protocol without intravenous contrast. Multiplanar CT image reconstructions of the cervical spine were also generated. RADIATION DOSE REDUCTION:  This exam was performed according to the departmental dose-optimization program which includes automated exposure control, adjustment of the mA and/or kV according to patient size and/or use of iterative reconstruction technique. COMPARISON:  Brain MRI 06/05/2019.  Head CT 05/14/2019. FINDINGS: CT HEAD FINDINGS Brain: Generalized cerebral atrophy. Redemonstrated chronic cortically-based infarcts within the bilateral frontal lobes and left parietal lobe. 16 x 5 mm age-indeterminate infarct centered within the left corona radiata and caudate nucleus, new from the prior brain MRI of 06/05/2019 but otherwise age indeterminate. Background mild patchy and ill-defined hypoattenuation within the cerebral white matter, nonspecific but compatible with chronic small ischemic disease. There is no acute intracranial hemorrhage. No extra-axial fluid collection. No evidence of an intracranial mass. No midline shift. Vascular: No hyperdense vessel.  Atherosclerotic calcifications. Skull: No fracture or aggressive osseous lesion. Sinuses/Orbits: No mass or acute finding within the imaged orbits. No significant paranasal sinus disease. CT CERVICAL SPINE FINDINGS Alignment: No significant spondylolisthesis. Skull base and vertebrae: The basion-dental and atlanto-dental intervals are maintained.No evidence of acute fracture to the cervical spine. Soft tissues and spinal canal: No prevertebral fluid or swelling. No visible canal hematoma. Disc levels: Prior C5-C6 ACDF. Solid arthrodesis across the C5-C6 disc space. No evidence of acute hardware compromise. Background cervical spondylosis with multilevel disc space narrowing and disc bulges/central disc protrusions. Disc space narrowing is greatest at C4-C5 (moderately advanced at this level). No appreciable high-grade spinal canal stenosis. No significant bony neural foraminal narrowing. Upper chest: No consolidation within the imaged lung apices. No visible pneumothorax. IMPRESSION: CT  head: 1. 16 x 5 mm infarct within the left corona radiata/caudate nucleus, new from the prior brain MRI of 06/05/2019 but otherwise age indeterminate. Consider a brain MRI for further evaluation. 2. No acute posttraumatic intracranial findings. 3. Redemonstrated chronic cortically-based infarcts within bilateral frontal and left parietal lobes. 4. Background mild cerebral white matter chronic small vessel ischemic disease. 5. Generalized cerebral atrophy. CT cervical spine: 1. No evidence of acute fracture to the cervical spine. 2. Prior C5-C6 ACDF. 3. Cervical spondylosis as described. Electronically Signed   By: Jackey Loge D.O.   On: 08/08/2022 14:05   CT Cervical Spine Wo Contrast  Result Date: 08/08/2022 CLINICAL DATA:  Provided history: Polytrauma, blunt. Additional history provided: Fall. Patient found down. EXAM: CT HEAD WITHOUT CONTRAST CT CERVICAL SPINE WITHOUT CONTRAST TECHNIQUE: Multidetector CT imaging of the head and cervical spine was performed following the standard protocol without intravenous contrast. Multiplanar CT image reconstructions of the cervical spine were also generated. RADIATION DOSE REDUCTION: This exam was performed according to the departmental dose-optimization program which includes automated exposure control, adjustment of the mA and/or kV according to patient size and/or use of iterative reconstruction technique. COMPARISON:  Brain MRI 06/05/2019.  Head CT 05/14/2019. FINDINGS: CT HEAD FINDINGS Brain: Generalized cerebral atrophy. Redemonstrated chronic cortically-based infarcts within the bilateral frontal lobes and left parietal lobe. 16 x 5 mm age-indeterminate infarct centered within the left corona radiata and caudate nucleus, new from the prior brain MRI of 06/05/2019 but otherwise age indeterminate. Background mild patchy and ill-defined hypoattenuation within the cerebral white matter, nonspecific but compatible with chronic small ischemic disease. There is no acute  intracranial hemorrhage. No extra-axial fluid collection. No evidence of an intracranial mass.  No midline shift. Vascular: No hyperdense vessel.  Atherosclerotic calcifications. Skull: No fracture or aggressive osseous lesion. Sinuses/Orbits: No mass or acute finding within the imaged orbits. No significant paranasal sinus disease. CT CERVICAL SPINE FINDINGS Alignment: No significant spondylolisthesis. Skull base and vertebrae: The basion-dental and atlanto-dental intervals are maintained.No evidence of acute fracture to the cervical spine. Soft tissues and spinal canal: No prevertebral fluid or swelling. No visible canal hematoma. Disc levels: Prior C5-C6 ACDF. Solid arthrodesis across the C5-C6 disc space. No evidence of acute hardware compromise. Background cervical spondylosis with multilevel disc space narrowing and disc bulges/central disc protrusions. Disc space narrowing is greatest at C4-C5 (moderately advanced at this level). No appreciable high-grade spinal canal stenosis. No significant bony neural foraminal narrowing. Upper chest: No consolidation within the imaged lung apices. No visible pneumothorax. IMPRESSION: CT head: 1. 16 x 5 mm infarct within the left corona radiata/caudate nucleus, new from the prior brain MRI of 06/05/2019 but otherwise age indeterminate. Consider a brain MRI for further evaluation. 2. No acute posttraumatic intracranial findings. 3. Redemonstrated chronic cortically-based infarcts within bilateral frontal and left parietal lobes. 4. Background mild cerebral white matter chronic small vessel ischemic disease. 5. Generalized cerebral atrophy. CT cervical spine: 1. No evidence of acute fracture to the cervical spine. 2. Prior C5-C6 ACDF. 3. Cervical spondylosis as described. Electronically Signed   By: Jackey Loge D.O.   On: 08/08/2022 14:05   VAS [place] ABI WITH/WO TBI  Result Date: 07/27/2022  LOWER EXTREMITY DOPPLER STUDY Patient Name:  MARITZA MONGAN  Date of Exam:    07/26/2022 Medical Rec #: [id number]        Accession #:    [id number] Date of Birth: Nov 15, 1950        Patient Gender: F Patient Age:   51 years Exam Location:  [facility] Procedure:      VAS [place] ABI WITH/WO TBI Referring Phys: Ovid Curd --------------------------------------------------------------------------------  Indications: Ulceration. Patient has an non-healing wound for over 1 year on the              medial plantar surface under left 1st toe. Patient unable to walk              for exercise due to severe neuropathy. She has no feeling from both              knees down to her toes. Unable to determine claudication symptoms.              She denies rest pain. History of right transmetatarsal amputation. High Risk Factors: Hypertension, hyperlipidemia, Diabetes, no history of                    smoking, prior CVA. Other Factors: History of OSA, CKD stage 2.  Limitations: Patient fell on her side last night and was unable to sleep much.              She failed to take her blood pressure medication this morning and              indicates her side is painful. Elevated brachial blood pressures.              Dr. [place] (DOD) notified after test completed. SEE NOTES BELOW              UNDER TEST SUMMARY. Comparison Study: Prior ABI 12/01/2021 showed right .94 left 1.05 Performing Technologist: Carlos American RVT  Examination Guidelines: A complete evaluation includes at  minimum, Doppler waveform signals and systolic blood pressure reading at the level of bilateral brachial, anterior tibial, and posterior tibial arteries, when vessel segments are accessible. Bilateral testing is considered an integral part of a complete examination. Photoelectric Plethysmograph (PPG) waveforms and toe systolic pressure readings are included as required and additional duplex testing as needed. Limited examinations for reoccurring indications may be performed as noted.  ABI Findings:  +---------+------------------+-----+---------+---------+ Right    Rt Pressure (mmHg)IndexWaveform Comment   +---------+------------------+-----+---------+---------+ Brachial 234                                       +---------+------------------+-----+---------+---------+ PTA      248               1.04 biphasic           +---------+------------------+-----+---------+---------+ PERO     222               0.93 triphasic          +---------+------------------+-----+---------+---------+ DP       216               0.90 biphasic           +---------+------------------+-----+---------+---------+ Great Toe0                 0.00          amputated +---------+------------------+-----+---------+---------+ +---------+------------------+-----+---------+-------+ Left     Lt Pressure (mmHg)IndexWaveform Comment +---------+------------------+-----+---------+-------+ Brachial 239                                     +---------+------------------+-----+---------+-------+ PTA      237               0.99 triphasic        +---------+------------------+-----+---------+-------+ PERO     248               1.06 biphasic         +---------+------------------+-----+---------+-------+ DP       246               1.03 biphasic         +---------+------------------+-----+---------+-------+ Great Toe167               0.70 Abnormal         +---------+------------------+-----+---------+-------+ +-------+-----------+-----------+------------+------------+ ABI/TBIToday's ABIToday's TBIPrevious ABIPrevious TBI +-------+-----------+-----------+------------+------------+ Right  1.04       amputated  .94                      +-------+-----------+-----------+------------+------------+ Left   1.04       .70        1.05                     +-------+-----------+-----------+------------+------------+  TOES Findings: +----------+---------------+--------+---------+ Right  ToesPressure (mmHg)WaveformComment   +----------+---------------+--------+---------+ 1st Digit                        amputated +----------+---------------+--------+---------+ 2nd Digit                Normal            +----------+---------------+--------+---------+ 3rd Digit                Normal            +----------+---------------+--------+---------+  4th Digit                Abnormal          +----------+---------------+--------+---------+ 5th Digit                Normal            +----------+---------------+--------+---------+  +---------+---------------+--------+-------+ Left ToesPressure (mmHg)WaveformComment +---------+---------------+--------+-------+ 1st Digit               Normal          +---------+---------------+--------+-------+ 2nd Digit               Abnormal        +---------+---------------+--------+-------+ 3rd Digit               Normal          +---------+---------------+--------+-------+ 4th Digit               Normal          +---------+---------------+--------+-------+ 5th Digit               Normal          +---------+---------------+--------+-------+   Right ABIs appear increased compared to prior study on 12/01/2021. Left ABIs appear essentially unchanged compared to prior study on 12/01/2021.  Summary: Right: Resting right ankle-brachial index is within normal range. Left: Resting left ankle-brachial index is within normal range. The left toe-brachial index is normal. Dr. [place] (DOD) notified about patient's elevated blood pressure readings. Dr. [place] sent patient home with instructions to take blood pressure medication and monitor blood pressure readings. *See table(s) above for measurements and observations.  Electronically signed by Nanetta Batty MD on 07/27/2022 at 6:15:18 AM.    Final    DG Foot Complete Left  Result Date: 07/18/2022 Please see detailed radiograph report in office note.   Microbiology: Results for  orders placed or performed during the hospital encounter of 08/08/22  Urine Culture     Status: Abnormal   Collection Time: 08/09/22  1:30 AM   Specimen: Urine, Clean Catch  Result Value Ref Range Status   Specimen Description URINE, CLEAN CATCH  Final   Special Requests   Final    NONE Performed at [facility], [address], [place], [place] [address]    Culture >=100,000 COLONIES/mL ESCHERICHIA COLI (A)  Final   Report Status 08/11/2022 FINAL  Final   Organism ID, Bacteria ESCHERICHIA COLI (A)  Final      Susceptibility   Escherichia coli - MIC*    AMPICILLIN <=2 SENSITIVE Sensitive     CEFAZOLIN <=4 SENSITIVE Sensitive     CEFEPIME <=0.12 SENSITIVE Sensitive     CIPROFLOXACIN <=0.25 SENSITIVE Sensitive     GENTAMICIN <=1 SENSITIVE Sensitive     IMIPENEM <=0.25 SENSITIVE Sensitive     NITROFURANTOIN <=16 SENSITIVE Sensitive     TRIMETH/SULFA <=20 SENSITIVE Sensitive     AMPICILLIN/SULBACTAM <=2 SENSITIVE Sensitive     PIP/TAZO <=4 SENSITIVE Sensitive     * >=100,000 COLONIES/mL ESCHERICHIA COLI    Labs: CBC: Recent Labs  Lab 08/08/22 1252 08/09/22 0328 08/10/22 0344 08/11/22 0136 08/12/22 0842  WBC 14.8* 9.5 5.9 5.5 4.9  NEUTROABS 11.6*  --   --   --   --   HGB 12.1 9.1* 8.6* 8.3* 9.5*  HCT 36.7 26.5* 25.7* 24.6* 28.7*  MCV 100.3* 95.0 98.5 97.2 98.3  PLT 323 204 193 194 201   Basic Metabolic Panel: Recent Labs  Lab  08/08/22 1252 08/09/22 0328 08/10/22 0344 08/11/22 1230 08/12/22 0842  NA 134* 131* 132* 135 138  K 3.8 3.5 3.8 3.4* 3.8  CL 99 101 104 104 107  CO2 21* 22 20* 23 24  GLUCOSE 172* 146* 157* 133* 180*  BUN 45* 40* 33* 27* 26*  CREATININE 2.29* 1.85* 1.55* 1.40* 1.33*  CALCIUM 9.2 8.2* 8.0* 8.1* 8.4*  MG  --  1.8  --   --   --   PHOS  --  3.9  --   --   --    Liver Function Tests: Recent Labs  Lab 08/08/22 1252 08/09/22 0328 08/10/22 0344  AST 47* 43* 32  ALT 27 23 20   ALKPHOS 46 34* 30*  BILITOT 0.7 1.4* 1.3*  PROT 8.0  5.8* 5.4*  ALBUMIN 3.4* 2.6* 2.3*   CBG: Recent Labs  Lab 08/11/22 0721 08/11/22 1157 08/11/22 1637 08/11/22 2120 08/12/22 0816  GLUCAP 104* 198* 197* 219* 166*    Discharge time spent: 35 minutes.  Signed: Jacquelin Hawking, MD [facility] 08/12/2022

## 2022-08-12 NOTE — Progress Notes (Signed)
Holly Sheriff, DO  Physician Physical Medicine and Rehabilitation   PMR Pre-admission    Signed   Date of Service: 08/11/2022  9:24 AM  Related encounter: ED to Hosp-Admission (Current) from 08/08/2022 in [facility]   Signed      Show:Clear all [x] Written[x] Templated[x] Copied  Added by: [x] Standley Brooking, RN  [] Hover for details PMR Admission Coordinator Pre-Admission Assessment   Patient: [name] is an 72 y.o., female MRN: [id number] DOB: [birth year] Height: 5' 9.5" (176.5 cm) Weight: 108.2 kg                                                                                                                                                  Insurance Information HMO:     PPO: yes     PCP:      IPA:      80/20:      OTHER:  PRIMARY: [employer]      Policy#: [id number]      Subscriber: pt CM Name: Junious Dresser      Phone#: [phone]     Fax#: [phone] Pre-Cert#: [id number] approved for 7 days     Employer:  Benefits:  Phone #: [phone]     Name: 5/22 Eff. Date: 03/21/22     Deduct: none      Out of Pocket Max: $3200  CIR: $295 co pay per day days 1 until 6      SNF: no co pay per day days 1 until 20; $203 co co pay per day days 21 until 100 Outpatient: $15 per visit     Co-Pay: visits per medical neccesity Home Health: 100%      Co-Pay: visits per medical neccesity DME: 80%     Co-Pay: 20% Providers: in network  SECONDARY: none   Financial Counselor:       Phone#:    The Data processing manager" for patients in Inpatient Rehabilitation Facilities with attached "Privacy Act Statement-Health Care Records" was provided and verbally reviewed with: Patient   Emergency Contact Information Contact Information       Name Relation Home Work Mobile    [name]     [phone]         Current Medical History  Patient Admitting Diagnosis: CVA   History of Present Illness: 72 year old  right-handed female with a 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  Presented 08/08/2022 with progressive weakness and recurrent falls.  Per her sister after most recent fall patient sat on the floor for almost 10 hours and could not get up without assistance and was found covered in urine and feces.Marland Kitchen  Cranial CT scan showed a 16 x 5 mm infarct within the left corona radiata/caudate nucleus, new from prior brain MRI 06/05/2019 but otherwise age-indeterminate.  No acute posttraumatic intracranial findings.  Redemonstrated chronic cortically based infarcts within bilateral frontal and left parietal lobes.  CT cervical spine negative for fracture or dislocation.  MRI multifocal bilateral acute ischemia, predominantly affecting the deep gray matter.  There was also 2 cortical foci within the right MCA territory.  No hemorrhage or mass effect.  Old bilateral MCA territory infarcts. Chest X RAY suspect acute fracture right eighth rib. Patient did not receive TNK.  As well as swelling of the lower extremities however patient had not been taking her Lasix for several months. MRA of the head was unremarkable.  Admission chemistries unremarkable except sodium 134 glucose 172 BUN 45 creatinine 2.29, WBC 14,800, CK 1265, urine culture greater than 100,000 E. coli, hemoglobin A1c 7.0.  Carotid Dopplers with left 40 to 59% ICA stenosis.  Echocardiogram ejection fraction of 60 to 65% no wall motion abnormality grade 1 diastolic dysfunction.  Neurology follow-up patient placed on low-dose aspirin and Plavix for CVA prophylaxis x 3 weeks then aspirin alone.  Recommendations of 30-day cardiac event monitor Lovenox added for DVT prophylaxis.  Placed on course of intravenous Rocephin for UTI.  WOC consulted for left foot wound  followed by podiatry Dr. Ardelle Anton and recently did see the patient 5/13 and debrided the wound .Follow-up hemoglobin 08/11/2022 of 8.3 that has steadily declined from 12.52 weeks prior with no visible signs of bleeding initial concern for possible relation to hemoconcentration his WBC and platelets have also dropped.  FOBT pending and anemia panel.  His follow-up CBC the following day had improved to 9.5.   Glasgow Coma Scale Score: 15   Patient's medical record from [facility] has been reviewed by the rehabilitation admission coordinator and physician.   Past Medical History      Past Medical History:  Diagnosis Date   Breast cancer (HCC) 12/2019    left breast DCIS   CKD (chronic kidney disease) 06/07/2016    Stage 2, GFR 60-89 ml/min   Diabetic polyneuropathy 06/07/2016   Dyslipidemia     Endometrial adenocarcinoma 2012   Fever blister     Hyperlipidemia     Hypertension, essential 06/07/2016   Major depressive disorder     Mixed dyslipidemia 06/07/2016   MRSA infection 10/2019    left foot wound   Obstructive sleep apnea 06/07/2016    does not use CPAP   Peripheral neuropathy     Retinopathy     Severe obesity     Skin ulcer of left foot with fat layer exposed 11/02/2016   Stroke      Asymptomatic, discovered via neuroimaging; small infarct in left parietal lobe; also concern for small b/l frontal infarcts   Tachycardia 07/19/2017   Type 2 diabetes mellitus with hyperglycemia, with long-term current use of insulin 06/07/2016   Unilateral primary osteoarthritis, right knee 04/16/2019    Has the patient had major surgery during 100 days prior to admission? No   Family History  family history includes Alzheimer's disease in her father and mother; Bladder Cancer in her father; Breast cancer (age of onset: 17) in her paternal aunt; CAD in her father; Diverticulitis in her sister; Heart attack in her father; Hypertension in her mother and sister; Obesity in her sister; Prostate  cancer in her paternal uncle; Voice disorder in her brother.   Current Medications  Current Facility-Administered Medications:    0.9 %  sodium chloride infusion, , Intravenous, Continuous, Noralee Stain, DO, Last Rate: 75 mL/hr at 08/12/22 4098, Infusion Verify at 08/12/22 1191   acetaminophen (TYLENOL) tablet 650 mg, 650 mg, Oral, Q6H PRN, Darlin Drop, DO, 650 mg at 08/09/22 2132   aspirin EC tablet 81 mg, 81 mg, Oral, Daily, Darlin Drop, DO, 81 mg at 08/12/22 4782   atorvastatin (LIPITOR) tablet 80 mg, 80 mg, Oral, Daily, Darlin Drop, DO, 80 mg at 08/12/22 9562   buPROPion (WELLBUTRIN XL) 24 hr tablet 300 mg, 300 mg, Oral, Daily, Noralee Stain, DO, 300 mg at 08/12/22 1308   carvedilol (COREG) tablet 6.25 mg, 6.25 mg, Oral, BID, Noralee Stain, DO, 6.25 mg at 08/12/22 6578   clopidogrel (PLAVIX) tablet 75 mg, 75 mg, Oral, Daily, Hall, Carole N, DO, 75 mg at 08/12/22 0832   enoxaparin (LOVENOX) injection 40 mg, 40 mg, Subcutaneous, Q24H, Narda Bonds, MD, 40 mg at 08/12/22 4696   escitalopram (LEXAPRO) tablet 20 mg, 20 mg, Oral, Daily, Noralee Stain, DO, 20 mg at 08/12/22 2952   HYDROmorphone (DILAUDID) injection 0.5 mg, 0.5 mg, Intravenous, Q4H PRN, Margo Aye, Carole N, DO   insulin aspart (novoLOG) injection 0-9 Units, 0-9 Units, Subcutaneous, TID WC, Noralee Stain, DO, 2 Units at 08/12/22 8413   insulin glargine-yfgn (SEMGLEE) injection 15 Units, 15 Units, Subcutaneous, QHS, Narda Bonds, MD, 15 Units at 08/11/22 2202   labetalol (NORMODYNE) injection 5 mg, 5 mg, Intravenous, Q2H PRN, Hall, Carole N, DO   losartan (COZAAR) tablet 50 mg, 50 mg, Oral, q morning, Noralee Stain, DO, 50 mg at 08/12/22 2440   melatonin tablet 3 mg, 3 mg, Oral, QHS PRN, Darlin Drop, DO   mupirocin cream (BACTROBAN) 2 %, , Topical, Daily, Narda Bonds, MD, Given at 08/12/22 [phone]   oxyCODONE (Oxy IR/ROXICODONE) immediate release tablet 5 mg, 5 mg, Oral, Q6H PRN, Margo Aye, Carole N, DO    polyethylene glycol (MIRALAX / GLYCOLAX) packet 17 g, 17 g, Oral, Daily PRN, Hall, Carole N, DO   prochlorperazine (COMPAZINE) injection 5 mg, 5 mg, Intravenous, Q6H PRN, Dow Adolph N, DO   Patients Current Diet:  Diet Order                  Diet Carb Modified Fluid consistency: Thin; Room service appropriate? Yes  Diet effective now                       Precautions / Restrictions Precautions Precautions: Fall Restrictions Weight Bearing Restrictions: No    Has the patient had 2 or more falls or a fall with injury in the past year?Yes frequent falls in past week pta   Prior Activity Level Limited Community (1-2x/wk): Mod I with cane, drove very limited   Prior Functional Level Prior Function Prior Level of Function : Independent/Modified Independent, Driving Mobility Comments: walked with cane out of the home, no AD inside ADLs Comments: sits to shower   Self Care: Did the patient need help bathing, dressing, using the toilet or eating?  Independent   Indoor Mobility: Did the patient need assistance with walking from room to room (with or without device)? Independent   Stairs: Did the patient need assistance with internal or external stairs (with or without device)? Independent   Functional Cognition: Did the patient need help planning regular tasks such as shopping or remembering to take medications? Independent   Patient Information Are  you of Hispanic, Latino/a,or Spanish origin?: A. No, not of Hispanic, Latino/a, or Spanish origin What is your race?: A. White Do you need or want an interpreter to communicate with a doctor or health care staff?: 0. No   Patient's Response To:  Health Literacy and Transportation Is the patient able to respond to health literacy and transportation needs?: Yes Health Literacy - How often do you need to have someone help you when you read instructions, pamphlets, or other written material from your doctor or pharmacy?: Never In the  past 12 months, has lack of transportation kept you from medical appointments or from getting medications?: No In the past 12 months, has lack of transportation kept you from meetings, work, or from getting things needed for daily living?: No   Journalist, newspaper / Equipment Home Assistive Devices/Equipment: None Home Equipment: Cane - single point, Agricultural consultant (2 wheels), Shower seat, Hand held shower head   Prior Device Use: Indicate devices/aids used by the patient prior to current illness, exacerbation or injury?  Cane outside the home   Current Functional Level Cognition   Overall Cognitive Status: No family/caregiver present to determine baseline cognitive functioning Orientation Level: Oriented X4 General Comments: some STM deficits vs decreased attention?    Extremity Assessment (includes Sensation/Coordination)   Upper Extremity Assessment: Overall WFL for tasks assessed  Lower Extremity Assessment: Defer to PT evaluation     ADLs   Overall ADL's : Needs assistance/impaired Eating/Feeding: Independent Grooming: Set up, Sitting Upper Body Bathing: Minimal assistance, Sitting Lower Body Bathing: Total assistance, +2 for physical assistance, Sit to/from stand Upper Body Dressing : Minimal assistance, Sitting Lower Body Dressing: Total assistance, Bed level Toilet Transfer: Minimal assistance, Stand-pivot, Rolling walker (2 wheels) Toilet Transfer Details (indicate cue type and reason): simulated with bed to chair. assist to steady and to stabilize walker as pt pulling up on walker despite hand placement cues. Extra time and effort Functional mobility during ADLs: +2 for physical assistance, Minimal assistance     Mobility   Overal bed mobility: Needs Assistance Bed Mobility: Supine to Sit Rolling: Min assist Sidelying to sit: HOB elevated, Min assist Supine to sit: Min assist General bed mobility comments: pt coming to longsitting position then pivoting hips to EOB  position. min Assist to stabilize during trunk elevation. extra time and effort.     Transfers   Overall transfer level: Needs assistance Equipment used: Rolling walker (2 wheels), None Transfers: Sit to/from Stand Sit to Stand: Min assist General transfer comment: to/from elevated EOB and recliner. assist to steady walker and control descent     Ambulation / Gait / Stairs / Wheelchair Mobility   Ambulation/Gait Ambulation/Gait assistance: Mod assist, Min assist Gait Distance (Feet):  (10 ft + 10 ft without AD with mod assist, 25 ft + 10 ft + 10 ft with RW & CGA) Assistive device: Rolling walker (2 wheels) Gait Pattern/deviations: Decreased step length - right, Decreased step length - left, Decreased stride length, Decreased dorsiflexion - right, Decreased dorsiflexion - left General Gait Details: Decreased heel strike BLE, LLE knee flexed throughout (no knee extension in stance phase but no buckling noted). PT provides cuing for increased heel strike but poor return demo. Gait velocity: decreased. Gait velocity interpretation: <1.31 ft/sec, indicative of household ambulator     Posture / Balance Balance Overall balance assessment: Needs assistance Sitting-balance support: No upper extremity supported, Feet supported Sitting balance-Leahy Scale: Good Standing balance support: During functional activity, No upper extremity supported Standing  balance-Leahy Scale: Poor Standing balance comment: Reliant on at least 1 UE for support to wash hands at sink, RW to ambulate. Increased flexion of knees and trunk as time in standing and pt fatigue increased.     Special needs/care consideration Fall precautions     Leanna Battles, RN  Registered Nurse WOC   Consult Note    Addendum   Date of Service: 08/11/2022  7:48 AM     Show:Clear all [x] Written[x] Templated[x] Copied   Added by: [x] Leanna Battles, RN   [] Hover for details WOC Nurse Consult Note: Reason for Consult: Consult  requested for left foot wound.  Performed remotely after review of progress notes and a photo in the EMR.   Pt is followed by Dr Ardelle Anton of the podiatry team prior to admission. He recently saw the patient on 5/13 in the office and debrided the wound.  He has ordered antibiotic ointment.  Left plantar foot with chronic full thickness wound; 1.5X.6X.3cm, according to podiatry notes, 90% red, 10% yellow with dry callous edges.  Dressing procedure/placement/frequency: Continue present plan of care as previously ordered; topical treatment orders provided for bedside nurses to perform: Apply Bactroban to left foot wound Q day, then cover with foam dressing.  Change foam dressing Q 3 days or PRN soiling. Pt should continue follow-up with podiatry team after discharge.  Please re-consult if further assistance is needed.  Mardee Postin MSN, RN, CWOCN, CWCN-AP, [place] [phone]             Revision History  Bilateral groin rash, buttocks abrasion, ecchymosis. Reports skin tear near anus causing irritation and bleeding since fall      Previous Home Environment  Living Arrangements: Alone  Lives With: Alone Available Help at Discharge: Family, Available 24 hours/day (will go live with sister who can do 24/7 assist in her home) Type of Home: House Home Layout: One level Home Access: Stairs to enter Entrance Stairs-Rails: Right, Left Entrance Stairs-Number of Steps: 5 Bathroom Shower/Tub: Health visitor: Handicapped height Bathroom Accessibility: Yes How Accessible: Accessible via walker Home Care Services: No   Discharge Living Setting Plans for Discharge Living Setting: Lives with (comment) (to live with sister, [name] and her brother in law at discharge) Type of Home at Discharge: House Discharge Home Layout: Two level, Able to live on main level with bedroom/bathroom Discharge Home Access: Stairs to enter Entrance Stairs-Rails: Right, Left Entrance Stairs-Number of  Steps: 5 Discharge Bathroom Shower/Tub: Tub/shower unit, Walk-in shower (has access to both downstairs) Discharge Bathroom Toilet: Standard Discharge Bathroom Accessibility: Yes How Accessible: Accessible via walker Does the patient have any problems obtaining your medications?: No   Social/Family/Support Systems Contact Information: sister, [name] and she is also her POA Anticipated Caregiver: [name], sister Anticipated Caregiver's Contact Information: see contacts Ability/Limitations of Caregiver: none Caregiver Availability: 24/7 Discharge Plan Discussed with Primary Caregiver: Yes Is Caregiver In Agreement with Plan?: Yes Does Caregiver/Family have Issues with Lodging/Transportation while Pt is in Rehab?: Yes   Goals Patient/Family Goal for Rehab: supervision with PT, supervision to min OT, supervision SLP Expected length of stay: ELOS 10 to 14 days Additional Information: To go stay with sister at discharge Pt/Family Agrees to Admission and willing to participate: Yes Program Orientation Provided & Reviewed with Pt/Caregiver Including Roles  & Responsibilities: Yes   Decrease burden of Care through IP rehab admission: n/a   Possible need for SNF placement upon discharge:not anticipated   Patient Condition: This patient's condition remains as documented  in the consult dated 08/09/22, in which the Rehabilitation Physician determined and documented that the patient's condition is appropriate for intensive rehabilitative care in an inpatient rehabilitation facility. Will admit to inpatient rehab today.   Preadmission Screen Completed By:  Clois Dupes, RN MSN 08/12/2022 11:30 AM ______________________________________________________________________   Discussed status with Dr. Shearon Stalls on 08/12/22 at 1130 and received approval for admission today.   Admission Coordinator:  Worthy Keeler MSN time [id number] Date 08/12/22            Revision History

## 2022-08-12 NOTE — Discharge Instructions (Signed)
[name],  You were in the hospital because of a stroke. Your medications have been adjusted. Please follow-up with the neurologist and your PCP. You will be discharging to acute inpatient rehabilitation prior to discharge home.

## 2022-08-12 NOTE — Progress Notes (Signed)
Ranelle Oyster, MD  Physician Physical Medicine and Rehabilitation   Consult Note    Addendum   Date of Service: 08/09/2022  1:36 PM   Expand All Collapse All  Show:Clear all [x] Written[x] Templated[] Copied  Added by: [x] Ranelle Oyster, MD  [] Hover for details          Physical Medicine and Rehabilitation Consult Reason for Consult: altered functional mobility after CVA Referring Physician: Alvino Chapel     HPI: [name] is a 72 y.o. female with a past history of breast cancer, diabetes, obesity, obstructive sleep apnea who presented to [facility] on 08/08/2022 with progressive weakness and falls.  Apparently she was on the ground for about 10 hours as she was unable to get up after her latest fall.  She has had increasing swelling in both of her legs and apparently stopped taking her Lasix a couple months back.  MRI of the brain demonstrated multi focal bilateral acute areas of ischemia predominantly affecting the deep gray matter.  There are also 2 cortical foci within the right MCA territory.  No hemorrhage was seen. Right 8th rib fracture found on xray.   Patient was seen by neurology today who recommends aspirin along with Plavix for 21 days followed by aspirin alone.  Patient is also being treated for a presumptive UTI and is on Rocephin.  She is also suffering from acute kidney injury likely due to poor oral intake.  Additionally she is hyponatremic and she is dealing with rhabdomyolysis due to her prolonged course on the floor.  Patient was evaluated by therapies today and was min assist for sit to stand transfers +2.  She was able to take steps today and use a rolling walker to move 3 feet with min guard assist +2.  She is limited by fatigue.  With Occupational Therapy she is min assist for upper body bathing dressing and total assist +2 for lower body.  Patient lives alone but has a sister who can stay with her as needed.  She has a 1 level house with 5 steps to  enter.  She walk with a cane outside the house prior to arrival.     Review of Systems  Constitutional:  Positive for malaise/fatigue. Negative for fever.  HENT: Negative.    Eyes: Negative.   Respiratory:  Positive for shortness of breath.   Cardiovascular:  Positive for leg swelling.  Gastrointestinal:  Negative for nausea and vomiting.  Genitourinary: Negative.   Musculoskeletal:  Positive for falls, joint pain and myalgias.  Skin:  Negative for rash.  Neurological:  Positive for dizziness, sensory change, focal weakness and weakness.  Psychiatric/Behavioral:  Positive for depression.         Past Medical History:  Diagnosis Date   Breast cancer (HCC) 12/2019    left breast DCIS   CKD (chronic kidney disease) 06/07/2016    Stage 2, GFR 60-89 ml/min   Diabetic polyneuropathy 06/07/2016   Dyslipidemia     Endometrial adenocarcinoma 2012   Fever blister     Hyperlipidemia     Hypertension, essential 06/07/2016   Major depressive disorder     Mixed dyslipidemia 06/07/2016   MRSA infection 10/2019    left foot wound   Obstructive sleep apnea 06/07/2016    does not use CPAP   Peripheral neuropathy     Retinopathy     Severe obesity     Skin ulcer of left foot with fat layer exposed 11/02/2016   Stroke  Asymptomatic, discovered via neuroimaging; small infarct in left parietal lobe; also concern for small b/l frontal infarcts   Tachycardia 07/19/2017   Type 2 diabetes mellitus with hyperglycemia, with long-term current use of insulin 06/07/2016   Unilateral primary osteoarthritis, right knee 04/16/2019         Past Surgical History:  Procedure Laterality Date   ADRENALECTOMY       BREAST LUMPECTOMY WITH RADIOACTIVE SEED LOCALIZATION Left 02/25/2020    Procedure: LEFT BREAST LUMPECTOMY WITH RADIOACTIVE SEED LOCALIZATION;  Surgeon: Abigail Miyamoto, MD;  Location: [facility];  Service: General;  Laterality: Left;   BUNIONECTOMY       CARDIAC CATHETERIZATION        CERVICAL SPINE SURGERY       CHOLECYSTECTOMY       DILATION AND CURETTAGE OF UTERUS       LAPAROSCOPIC SALPINGOOPHERECTOMY       THORACOTOMY       TONSILLECTOMY             Family History  Problem Relation Age of Onset   Hypertension Mother     Alzheimer's disease Mother     Heart attack Father     CAD Father     Alzheimer's disease Father     Bladder Cancer Father          dx late 27s   Diverticulitis Sister     Obesity Sister     Hypertension Sister     Voice disorder Brother     Breast cancer Paternal Aunt 53   Prostate cancer Paternal Uncle          dx mid 26s    Social History:  reports that she has never smoked. She has never used smokeless tobacco. She reports that she does not drink alcohol and does not use drugs. Allergies:       Allergies  Allergen Reactions   Amitriptyline Other (See Comments)   Ciprofibrate Other (See Comments)   Ciprofloxacin Other (See Comments)   Pristiq [Desvenlafaxine] Other (See Comments)   Sulfur        Other Reaction(s): Unknown   Penicillins Rash   Sulfa Antibiotics Rash   Sulfamethoxazole Rash          Medications Prior to Admission  Medication Sig Dispense Refill   buPROPion (WELLBUTRIN XL) 300 MG 24 hr tablet Take 300 mg by mouth daily.       carvedilol (COREG) 6.25 MG tablet Take 6.25 mg by mouth 2 (two) times daily.       doxycycline (VIBRAMYCIN) 100 MG capsule Take 1 capsule (100 mg total) by mouth 2 (two) times daily. 20 capsule 0   escitalopram (LEXAPRO) 10 MG tablet Take 10 mg by mouth daily.       escitalopram (LEXAPRO) 20 MG tablet Take 20 mg by mouth daily.       FLUoxetine (PROZAC) 40 MG capsule Take 40 mg by mouth daily.    0   furosemide (LASIX) 40 MG tablet Take 40 mg by mouth daily as needed.       insulin NPH-regular Human (NOVOLIN 70/30) (70-30) 100 UNIT/ML injection Inject into the skin. 24 ml in the morning and 20 ml in the evening       losartan (COZAAR) 50 MG tablet Take 50 mg by mouth every morning.        metFORMIN (GLUCOPHAGE-XR) 500 MG 24 hr tablet Take 1,000 mg by mouth in the morning and at bedtime. PT TAKES 2000  MG DAILY       MONOJECT INSULIN SYRINGE 31G X 5/16" 1 ML MISC See admin instructions.       mupirocin ointment (BACTROBAN) 2 % Apply 1 application topically daily.       nutrition supplement, JUVEN, (JUVEN) PACK Take 1 packet by mouth 2 (two) times daily between meals. 30 packet 2   nystatin cream (MYCOSTATIN) 1 APPLICATION TWICE A DAY EXTERNALLY       ONETOUCH VERIO test strip         pravastatin (PRAVACHOL) 40 MG tablet Take 1 tablet once a day for cholesterol       tamoxifen (NOLVADEX) 20 MG tablet TAKE ONE TABLET BY MOUTH EVERY MORNING 90 tablet 0      Home: Home Living Family/patient expects to be discharged to:: Private residence Living Arrangements: Alone Available Help at Discharge: Family, Available PRN/intermittently Type of Home: House Home Access: Stairs to enter Secretary/administrator of Steps: 5 Entrance Stairs-Rails: Right, Left Home Layout: One level Bathroom Shower/Tub: Health visitor: Handicapped height Home Equipment: Cane - single point, Agricultural consultant (2 wheels), Shower seat, Hand held shower head  Functional History: Prior Function Prior Level of Function : Independent/Modified Independent, Driving Mobility Comments: walked with cane out of the home, no AD inside ADLs Comments: sits to shower Functional Status:  Mobility: Bed Mobility Overal bed mobility: Needs Assistance Bed Mobility: Rolling, Sidelying to Sit Rolling: Max assist, +2 for physical assistance Sidelying to sit: Mod assist, +2 for physical assistance, HOB elevated Transfers Overall transfer level: Needs assistance Equipment used: Rolling walker (2 wheels) Transfers: Sit to/from Stand Sit to Stand: Min assist, +2 physical assistance, From elevated surface General transfer comment: increased time to power up to standing Ambulation/Gait Ambulation/Gait  assistance: Min guard, +2 physical assistance, +2 safety/equipment Gait Distance (Feet): 3 Feet Assistive device: Rolling walker (2 wheels) Gait Pattern/deviations: Step-to pattern, Decreased step length - right, Decreased step length - left, Decreased stride length General Gait Details: patient able to take a few steps from bed to recliner. Limited by fatigue Gait velocity: decr   ADL: ADL Overall ADL's : Needs assistance/impaired Eating/Feeding: Independent Grooming: Set up, Sitting Upper Body Bathing: Minimal assistance, Sitting Lower Body Bathing: Total assistance, +2 for physical assistance, Sit to/from stand Upper Body Dressing : Minimal assistance, Sitting Lower Body Dressing: Total assistance, Bed level Toilet Transfer: +2 for physical assistance, Minimal assistance, Rolling walker (2 wheels), Ambulation Functional mobility during ADLs: +2 for physical assistance, Minimal assistance   Cognition: Cognition Overall Cognitive Status: Within Functional Limits for tasks assessed Orientation Level: Oriented X4 Cognition Arousal/Alertness: Awake/alert Behavior During Therapy: Flat affect Overall Cognitive Status: Within Functional Limits for tasks assessed   Blood pressure (!) 182/55, pulse 77, temperature 98.5 F (36.9 C), temperature source Oral, resp. rate 16, height 5' 9.5" (1.765 m), weight 106.6 kg, SpO2 99 %. Physical Exam Constitutional:      General: She is not in acute distress.    Appearance: She is obese.  HENT:     Head: Normocephalic and atraumatic.     Nose: Nose normal.     Mouth/Throat:     Mouth: Mucous membranes are moist.  Eyes:     Conjunctiva/sclera: Conjunctivae normal.  Cardiovascular:     Rate and Rhythm: Normal rate.  Pulmonary:     Effort: Pulmonary effort is normal.  Abdominal:     Palpations: Abdomen is soft.  Musculoskeletal:     Cervical back: Normal range of motion.  Right lower leg: Edema present.     Left lower leg: Edema  present.     Comments: Right great toe amp. Severe hallux valgus left great toe. Right mid rib cage tender to palpation and movement of trunk or right arm.  Skin:    Comments: Callus left first MTP  Neurological:     Comments: Alert and oriented x 3. Reasonable insight and awareness. Fair Memory. Normal language and speech. Occasional processing delays. Cranial nerve exam unremarkable. MMT: UE nearly 4+ to 5/5 with pain inhibition d/t right ribs. BLE: 1+ to 2- HF, 2/5 KE and 4/5 ADF/PF. Stocking glove sensory loss in both legs up to knees. DTR's 1+, no abnl resting tone.    Psychiatric:     Comments: Pt slightly flat but otherwise appropriate and cooperative.         Lab Results Last 24 Hours       Results for orders placed or performed during the hospital encounter of 08/08/22 (from the past 24 hour(s))  CBG monitoring, ED     Status: Abnormal    Collection Time: 08/08/22  4:13 PM  Result Value Ref Range    Glucose-Capillary 191 (H) 70 - 99 mg/dL  Urinalysis, Routine w reflex microscopic -Urine, Clean Catch     Status: Abnormal    Collection Time: 08/08/22  6:52 PM  Result Value Ref Range    Color, Urine AMBER (A) YELLOW    APPearance CLOUDY (A) CLEAR    Specific Gravity, Urine 1.016 1.005 - 1.030    pH 6.0 5.0 - 8.0    Glucose, UA NEGATIVE NEGATIVE mg/dL    Hgb urine dipstick MODERATE (A) NEGATIVE    Bilirubin Urine NEGATIVE NEGATIVE    Ketones, ur NEGATIVE NEGATIVE mg/dL    Protein, ur >=161 (A) NEGATIVE mg/dL    Nitrite NEGATIVE NEGATIVE    Leukocytes,Ua LARGE (A) NEGATIVE    RBC / HPF 6-10 0 - 5 RBC/hpf    WBC, UA >50 0 - 5 WBC/hpf    Bacteria, UA MANY (A) NONE SEEN    Squamous Epithelial / HPF 6-10 0 - 5 /HPF    Non Squamous Epithelial 0-5 (A) NONE SEEN  Glucose, capillary     Status: Abnormal    Collection Time: 08/09/22  1:12 AM  Result Value Ref Range    Glucose-Capillary 132 (H) 70 - 99 mg/dL  CBC     Status: Abnormal    Collection Time: 08/09/22  3:28 AM  Result  Value Ref Range    WBC 9.5 4.0 - 10.5 K/uL    RBC 2.79 (L) 3.87 - 5.11 MIL/uL    Hemoglobin 9.1 (L) 12.0 - 15.0 g/dL    HCT 09.6 (L) 04.5 - 46.0 %    MCV 95.0 80.0 - 100.0 fL    MCH 32.6 26.0 - 34.0 pg    MCHC 34.3 30.0 - 36.0 g/dL    RDW 40.9 81.1 - 91.4 %    Platelets 204 150 - 400 K/uL    nRBC 0.0 0.0 - 0.2 %  Comprehensive metabolic panel     Status: Abnormal    Collection Time: 08/09/22  3:28 AM  Result Value Ref Range    Sodium 131 (L) 135 - 145 mmol/L    Potassium 3.5 3.5 - 5.1 mmol/L    Chloride 101 98 - 111 mmol/L    CO2 22 22 - 32 mmol/L    Glucose, Bld 146 (H) 70 - 99 mg/dL  BUN 40 (H) 8 - 23 mg/dL    Creatinine, Ser 4.09 (H) 0.44 - 1.00 mg/dL    Calcium 8.2 (L) 8.9 - 10.3 mg/dL    Total Protein 5.8 (L) 6.5 - 8.1 g/dL    Albumin 2.6 (L) 3.5 - 5.0 g/dL    AST 43 (H) 15 - 41 U/L    ALT 23 0 - 44 U/L    Alkaline Phosphatase 34 (L) 38 - 126 U/L    Total Bilirubin 1.4 (H) 0.3 - 1.2 mg/dL    GFR, Estimated 29 (L) >60 mL/min    Anion gap 8 5 - 15  Magnesium     Status: None    Collection Time: 08/09/22  3:28 AM  Result Value Ref Range    Magnesium 1.8 1.7 - 2.4 mg/dL  Phosphorus     Status: None    Collection Time: 08/09/22  3:28 AM  Result Value Ref Range    Phosphorus 3.9 2.5 - 4.6 mg/dL  Lipid panel     Status: Abnormal    Collection Time: 08/09/22  3:28 AM  Result Value Ref Range    Cholesterol 121 0 - 200 mg/dL    Triglycerides 811 <914 mg/dL    HDL 34 (L) >78 mg/dL    Total CHOL/HDL Ratio 3.6 RATIO    VLDL 29 0 - 40 mg/dL    LDL Cholesterol 58 0 - 99 mg/dL  Hemoglobin G9F     Status: Abnormal    Collection Time: 08/09/22  3:28 AM  Result Value Ref Range    Hgb A1c MFr Bld 7.0 (H) 4.8 - 5.6 %    Mean Plasma Glucose 154.2 mg/dL  Glucose, capillary     Status: Abnormal    Collection Time: 08/09/22  5:25 AM  Result Value Ref Range    Glucose-Capillary 150 (H) 70 - 99 mg/dL  Glucose, capillary     Status: Abnormal    Collection Time: 08/09/22  9:05 AM   Result Value Ref Range    Glucose-Capillary 171 (H) 70 - 99 mg/dL  Glucose, capillary     Status: Abnormal    Collection Time: 08/09/22 11:19 AM  Result Value Ref Range    Glucose-Capillary 193 (H) 70 - 99 mg/dL       Imaging Results (Last 48 hours)  MR ANGIO HEAD WO CONTRAST   Result Date: 08/09/2022 CLINICAL DATA:  Stroke follow-up EXAM: MRA HEAD WITHOUT CONTRAST TECHNIQUE: Angiographic images of the Circle of Willis were acquired using MRA technique without intravenous contrast. COMPARISON:  None Available. FINDINGS: POSTERIOR CIRCULATION: --Vertebral arteries: Normal --Inferior cerebellar arteries: Normal. --Basilar artery: Normal. --Superior cerebellar arteries: Normal. --Posterior cerebral arteries: Normal. ANTERIOR CIRCULATION: --Intracranial internal carotid arteries: Normal. --Anterior cerebral arteries (ACA): Normal. --Middle cerebral arteries (MCA): Normal. Anatomic variants: None Other: None. IMPRESSION: Normal intracranial MRA. Electronically Signed   By: Deatra Robinson M.D.   On: 08/09/2022 01:06    MR BRAIN WO CONTRAST   Result Date: 08/08/2022 CLINICAL DATA:  Altered mental status EXAM: MRI HEAD WITHOUT CONTRAST TECHNIQUE: Multiplanar, multiecho pulse sequences of the brain and surrounding structures were obtained without intravenous contrast. COMPARISON:  06/05/2019 FINDINGS: Brain: Multifocal bilateral acute ischemia, predominantly affecting the deep gray matter. There also 2 cortical foci within the right MCA territory. There are old bilateral MCA territory infarcts. No acute or chronic hemorrhage. There is multifocal hyperintense T2-weighted signal within the white matter. Generalized volume loss. The midline structures are normal. Vascular: Major flow voids are preserved.  Skull and upper cervical spine: Normal calvarium and skull base. Visualized upper cervical spine and soft tissues are normal. Sinuses/Orbits:No paranasal sinus fluid levels or advanced mucosal thickening. No  mastoid or middle ear effusion. Normal orbits. IMPRESSION: 1. Multifocal bilateral acute ischemia, predominantly affecting the deep gray matter. There also 2 cortical foci within the right MCA territory. No hemorrhage or mass effect. 2. Old bilateral MCA territory infarcts. 3. Mild volume loss and chronic ischemic microangiopathy. Electronically Signed   By: Deatra Robinson M.D.   On: 08/08/2022 21:34    DG Ribs Unilateral W/Chest Right   Result Date: 08/08/2022 CLINICAL DATA:  De Hollingshead 3 times in the past week with right lower anterior rib pain EXAM: RIGHT RIBS AND CHEST - 3+ VIEW COMPARISON:  06/04/2010 FINDINGS: Suspected acute fracture of the anterior right eighth rib. There is no evidence of pneumothorax or pleural effusion. Both lungs are clear. Stable cardiomediastinal silhouette. Aortic atherosclerotic calcification. Cervical spine fusion hardware IMPRESSION: Suspected acute fracture of the anterior right eighth rib. Electronically Signed   By: Minerva Fester M.D.   On: 08/08/2022 20:14    CT Head Wo Contrast   Result Date: 08/08/2022 CLINICAL DATA:  Provided history: Polytrauma, blunt. Additional history provided: Fall. Patient found down. EXAM: CT HEAD WITHOUT CONTRAST CT CERVICAL SPINE WITHOUT CONTRAST TECHNIQUE: Multidetector CT imaging of the head and cervical spine was performed following the standard protocol without intravenous contrast. Multiplanar CT image reconstructions of the cervical spine were also generated. RADIATION DOSE REDUCTION: This exam was performed according to the departmental dose-optimization program which includes automated exposure control, adjustment of the mA and/or kV according to patient size and/or use of iterative reconstruction technique. COMPARISON:  Brain MRI 06/05/2019.  Head CT 05/14/2019. FINDINGS: CT HEAD FINDINGS Brain: Generalized cerebral atrophy. Redemonstrated chronic cortically-based infarcts within the bilateral frontal lobes and left parietal lobe. 16 x  5 mm age-indeterminate infarct centered within the left corona radiata and caudate nucleus, new from the prior brain MRI of 06/05/2019 but otherwise age indeterminate. Background mild patchy and ill-defined hypoattenuation within the cerebral white matter, nonspecific but compatible with chronic small ischemic disease. There is no acute intracranial hemorrhage. No extra-axial fluid collection. No evidence of an intracranial mass. No midline shift. Vascular: No hyperdense vessel.  Atherosclerotic calcifications. Skull: No fracture or aggressive osseous lesion. Sinuses/Orbits: No mass or acute finding within the imaged orbits. No significant paranasal sinus disease. CT CERVICAL SPINE FINDINGS Alignment: No significant spondylolisthesis. Skull base and vertebrae: The basion-dental and atlanto-dental intervals are maintained.No evidence of acute fracture to the cervical spine. Soft tissues and spinal canal: No prevertebral fluid or swelling. No visible canal hematoma. Disc levels: Prior C5-C6 ACDF. Solid arthrodesis across the C5-C6 disc space. No evidence of acute hardware compromise. Background cervical spondylosis with multilevel disc space narrowing and disc bulges/central disc protrusions. Disc space narrowing is greatest at C4-C5 (moderately advanced at this level). No appreciable high-grade spinal canal stenosis. No significant bony neural foraminal narrowing. Upper chest: No consolidation within the imaged lung apices. No visible pneumothorax. IMPRESSION: CT head: 1. 16 x 5 mm infarct within the left corona radiata/caudate nucleus, new from the prior brain MRI of 06/05/2019 but otherwise age indeterminate. Consider a brain MRI for further evaluation. 2. No acute posttraumatic intracranial findings. 3. Redemonstrated chronic cortically-based infarcts within bilateral frontal and left parietal lobes. 4. Background mild cerebral white matter chronic small vessel ischemic disease. 5. Generalized cerebral atrophy. CT  cervical spine: 1. No evidence of acute fracture to  the cervical spine. 2. Prior C5-C6 ACDF. 3. Cervical spondylosis as described. Electronically Signed   By: Jackey Loge D.O.   On: 08/08/2022 14:05    CT Cervical Spine Wo Contrast   Result Date: 08/08/2022 CLINICAL DATA:  Provided history: Polytrauma, blunt. Additional history provided: Fall. Patient found down. EXAM: CT HEAD WITHOUT CONTRAST CT CERVICAL SPINE WITHOUT CONTRAST TECHNIQUE: Multidetector CT imaging of the head and cervical spine was performed following the standard protocol without intravenous contrast. Multiplanar CT image reconstructions of the cervical spine were also generated. RADIATION DOSE REDUCTION: This exam was performed according to the departmental dose-optimization program which includes automated exposure control, adjustment of the mA and/or kV according to patient size and/or use of iterative reconstruction technique. COMPARISON:  Brain MRI 06/05/2019.  Head CT 05/14/2019. FINDINGS: CT HEAD FINDINGS Brain: Generalized cerebral atrophy. Redemonstrated chronic cortically-based infarcts within the bilateral frontal lobes and left parietal lobe. 16 x 5 mm age-indeterminate infarct centered within the left corona radiata and caudate nucleus, new from the prior brain MRI of 06/05/2019 but otherwise age indeterminate. Background mild patchy and ill-defined hypoattenuation within the cerebral white matter, nonspecific but compatible with chronic small ischemic disease. There is no acute intracranial hemorrhage. No extra-axial fluid collection. No evidence of an intracranial mass. No midline shift. Vascular: No hyperdense vessel.  Atherosclerotic calcifications. Skull: No fracture or aggressive osseous lesion. Sinuses/Orbits: No mass or acute finding within the imaged orbits. No significant paranasal sinus disease. CT CERVICAL SPINE FINDINGS Alignment: No significant spondylolisthesis. Skull base and vertebrae: The basion-dental and  atlanto-dental intervals are maintained.No evidence of acute fracture to the cervical spine. Soft tissues and spinal canal: No prevertebral fluid or swelling. No visible canal hematoma. Disc levels: Prior C5-C6 ACDF. Solid arthrodesis across the C5-C6 disc space. No evidence of acute hardware compromise. Background cervical spondylosis with multilevel disc space narrowing and disc bulges/central disc protrusions. Disc space narrowing is greatest at C4-C5 (moderately advanced at this level). No appreciable high-grade spinal canal stenosis. No significant bony neural foraminal narrowing. Upper chest: No consolidation within the imaged lung apices. No visible pneumothorax. IMPRESSION: CT head: 1. 16 x 5 mm infarct within the left corona radiata/caudate nucleus, new from the prior brain MRI of 06/05/2019 but otherwise age indeterminate. Consider a brain MRI for further evaluation. 2. No acute posttraumatic intracranial findings. 3. Redemonstrated chronic cortically-based infarcts within bilateral frontal and left parietal lobes. 4. Background mild cerebral white matter chronic small vessel ischemic disease. 5. Generalized cerebral atrophy. CT cervical spine: 1. No evidence of acute fracture to the cervical spine. 2. Prior C5-C6 ACDF. 3. Cervical spondylosis as described. Electronically Signed   By: Jackey Loge D.O.   On: 08/08/2022 14:05       Assessment/Plan: Diagnosis: 72 year old female with functional deficits due to bi-cerebral infarcts in the setting of significant bilateral lower extremity edema and rhabdomyolysis. Does the need for close, 24 hr/day medical supervision in concert with the patient's rehab needs make it unreasonable for this patient to be served in a less intensive setting? Yes Co-Morbidities requiring supervision/potential complications:  -UTI/ID -AKI/nutritional status -Lower extremity edema -rhabdomyolysis -depression -hyponatremia -type 2 diabetes with diabetic peripheral  neuropathy -obesity -right 8th rib fracture after fall   Due to bladder management, bowel management, safety, skin/wound care, disease management, medication administration, pain management, and patient education, does the patient require 24 hr/day rehab nursing? Yes Does the patient require coordinated care of a physician, rehab nurse, therapy disciplines of PT, OT, SLP to address physical  and functional deficits in the context of the above medical diagnosis(es)? Yes Addressing deficits in the following areas: balance, endurance, locomotion, strength, transferring, bowel/bladder control, bathing, dressing, feeding, grooming, toileting, cognition, and psychosocial support Can the patient actively participate in an intensive therapy program of at least 3 hrs of therapy per day at least 5 days per week? Yes The potential for patient to make measurable gains while on inpatient rehab is excellent Anticipated functional outcomes upon discharge from inpatient rehab are supervision  with PT, supervision and min assist with OT, modified independent with SLP. Estimated rehab length of stay to reach the above functional goals is: 13-19 days Anticipated discharge destination: Home Overall Rehab/Functional Prognosis: excellent   POST ACUTE RECOMMENDATIONS: This patient's condition is appropriate for continued rehabilitative care in the following setting: CIR Patient has agreed to participate in recommended program. Yes Note that insurance prior authorization may be required for reimbursement for recommended care.   Comment: Pt also has been dealing with significant depression which is one of the reasons she let things go and is now in the position she's in.  Sister is very supportive and available to help at home. She has HPOA. Rehab Admissions Coordinator to follow up      MEDICAL RECOMMENDATIONS: Would benefit from neuropsychological counseling to address coping skills. Probably needs adjustment of her  depression medication regimen as well.  Elevation and compression (ACE wrap) of lower extremities for edema control     I have personally performed a face to face diagnostic evaluation of this patient. Additionally, I have examined the patient's medical record including any pertinent labs and radiographic images. If the physician assistant has documented in this note, I have reviewed and edited or otherwise concur with the physician assistant's documentation.   Thanks,   Ranelle Oyster, MD 08/09/2022      Revision History  Routing History

## 2022-08-12 NOTE — Progress Notes (Signed)
Inpatient Rehabilitation Admission Medication Review by a Pharmacist  A complete drug regimen review was completed for this patient to identify any potential clinically significant medication issues.  High Risk Drug Classes Is patient taking? Indication by Medication  Antipsychotic No   Anticoagulant Yes Enoxaparin-VTE px  Antibiotic No   Opioid Yes Oxycodone-pain  Antiplatelet Yes Aspirin, Clopidogrel-Stroke  Hypoglycemics/insulin Yes Novolog SSI, insulin glargine-DM  Vasoactive Medication Yes Carvedilol, losartan-HTN  Chemotherapy Yes, Oral Chemotherapy Tamoxifen-h/o Breast CA  Other Yes Atorvastatin-HLD Bupropion, escitalopram-MDD Melatonin-Sleep Miralax-constipation      Type of Medication Issue Identified Description of Issue Recommendation(s)  Drug Interaction(s) (clinically significant)     Duplicate Therapy     Allergy     No Medication Administration End Date  Clopidogrel is to be continued for 3 weeks and then ASA alone.  Please place stop date on clopidogrel for 08/30/22  Incorrect Dose     Additional Drug Therapy Needed     Significant med changes from prior encounter (inform family/care partners about these prior to discharge).    Other       Clinically significant medication issues were identified that warrant physician communication and completion of prescribed/recommended actions by midnight of the next day:  No  Name of provider notified for urgent issues identified:   Provider Method of Notification:     Pharmacist comments:   Time spent performing this drug regimen review (minutes):  20   Tamila Gaulin A. Jeanella Craze, PharmD, BCPS, FNKF Clinical Pharmacist [place] Please utilize Amion for appropriate phone number to reach the unit pharmacist [facility])  08/12/2022 4:15 PM

## 2022-08-12 NOTE — Progress Notes (Signed)
Holly Oyster, MD  Physician Physical Medicine and Rehabilitation   Consult Note    Addendum   Date of Service: 08/09/2022  1:36 PM  Related encounter: ED to Hosp-Admission (Current) from 08/08/2022 in [facility]   Expand All Collapse All  Show:Clear all [x] Written[x] Templated[] Copied  Added by: [x] Holly Oyster, MD  [] Hover for details          Physical Medicine and Rehabilitation Consult Reason for Consult: altered functional mobility after CVA Referring Physician: Alvino [name]     HPI: [name] is a 72 y.o. female with a past history of breast cancer, diabetes, obesity, obstructive sleep apnea who presented to [facility] on 08/08/2022 with progressive weakness and falls.  Apparently she was on the ground for about 10 hours as she was unable to get up after her latest fall.  She has had increasing swelling in both of her legs and apparently stopped taking her Lasix a couple months back.  MRI of the brain demonstrated multi focal bilateral acute areas of ischemia predominantly affecting the deep gray matter.  There are also 2 cortical foci within the right MCA territory.  No hemorrhage was seen. Right 8th rib fracture found on xray.   Patient was seen by neurology today who recommends aspirin along with Plavix for 21 days followed by aspirin alone.  Patient is also being treated for a presumptive UTI and is on Rocephin.  She is also suffering from acute kidney injury likely due to poor oral intake.  Additionally she is hyponatremic and she is dealing with rhabdomyolysis due to her prolonged course on the floor.  Patient was evaluated by therapies today and was min assist for sit to stand transfers +2.  She was able to take steps today and use a rolling walker to move 3 feet with min guard assist +2.  She is limited by fatigue.  With Occupational Therapy she is min assist for upper body bathing dressing and total assist +2 for  lower body.  Patient lives alone but has a sister who can stay with her as needed.  She has a 1 level house with 5 steps to enter.  She walk with a cane outside the house prior to arrival.     Review of Systems  Constitutional:  Positive for malaise/fatigue. Negative for fever.  HENT: Negative.    Eyes: Negative.   Respiratory:  Positive for shortness of breath.   Cardiovascular:  Positive for leg swelling.  Gastrointestinal:  Negative for nausea and vomiting.  Genitourinary: Negative.   Musculoskeletal:  Positive for falls, joint pain and myalgias.  Skin:  Negative for rash.  Neurological:  Positive for dizziness, sensory change, focal weakness and weakness.  Psychiatric/Behavioral:  Positive for depression.         Past Medical History:  Diagnosis Date   Breast cancer (HCC) 12/2019    left breast DCIS   CKD (chronic kidney disease) 06/07/2016    Stage 2, GFR 60-89 ml/min   Diabetic polyneuropathy 06/07/2016   Dyslipidemia     Endometrial adenocarcinoma 2012   Fever blister     Hyperlipidemia     Hypertension, essential 06/07/2016   Major depressive disorder     Mixed dyslipidemia 06/07/2016   MRSA infection 10/2019    left foot wound   Obstructive sleep apnea 06/07/2016    does not use CPAP   Peripheral neuropathy     Retinopathy     Severe obesity  Skin ulcer of left foot with fat layer exposed 11/02/2016   Stroke      Asymptomatic, discovered via neuroimaging; small infarct in left parietal lobe; also concern for small b/l frontal infarcts   Tachycardia 07/19/2017   Type 2 diabetes mellitus with hyperglycemia, with long-term current use of insulin 06/07/2016   Unilateral primary osteoarthritis, right knee 04/16/2019         Past Surgical History:  Procedure Laterality Date   ADRENALECTOMY       BREAST LUMPECTOMY WITH RADIOACTIVE SEED LOCALIZATION Left 02/25/2020    Procedure: LEFT BREAST LUMPECTOMY WITH RADIOACTIVE SEED LOCALIZATION;  Surgeon: Abigail Miyamoto, MD;   Location: [facility];  Service: General;  Laterality: Left;   BUNIONECTOMY       CARDIAC CATHETERIZATION       CERVICAL SPINE SURGERY       CHOLECYSTECTOMY       DILATION AND CURETTAGE OF UTERUS       LAPAROSCOPIC SALPINGOOPHERECTOMY       THORACOTOMY       TONSILLECTOMY             Family History  Problem Relation Age of Onset   Hypertension Mother     Alzheimer's disease Mother     Heart attack Father     CAD Father     Alzheimer's disease Father     Bladder Cancer Father          dx late 72s   Diverticulitis Sister     Obesity Sister     Hypertension Sister     Voice disorder Brother     Breast cancer Paternal Aunt 66   Prostate cancer Paternal Uncle          dx mid 37s    Social History:  reports that she has never smoked. She has never used smokeless tobacco. She reports that she does not drink alcohol and does not use drugs. Allergies:       Allergies  Allergen Reactions   Amitriptyline Other (See Comments)   Ciprofibrate Other (See Comments)   Ciprofloxacin Other (See Comments)   Pristiq [Desvenlafaxine] Other (See Comments)   Sulfur        Other Reaction(s): Unknown   Penicillins Rash   Sulfa Antibiotics Rash   Sulfamethoxazole Rash          Medications Prior to Admission  Medication Sig Dispense Refill   buPROPion (WELLBUTRIN XL) 300 MG 24 hr tablet Take 300 mg by mouth daily.       carvedilol (COREG) 6.25 MG tablet Take 6.25 mg by mouth 2 (two) times daily.       doxycycline (VIBRAMYCIN) 100 MG capsule Take 1 capsule (100 mg total) by mouth 2 (two) times daily. 20 capsule 0   escitalopram (LEXAPRO) 10 MG tablet Take 10 mg by mouth daily.       escitalopram (LEXAPRO) 20 MG tablet Take 20 mg by mouth daily.       FLUoxetine (PROZAC) 40 MG capsule Take 40 mg by mouth daily.    0   furosemide (LASIX) 40 MG tablet Take 40 mg by mouth daily as needed.       insulin NPH-regular Human (NOVOLIN 70/30) (70-30) 100 UNIT/ML injection Inject into the  skin. 24 ml in the morning and 20 ml in the evening       losartan (COZAAR) 50 MG tablet Take 50 mg by mouth every morning.       metFORMIN (GLUCOPHAGE-XR) 500  MG 24 hr tablet Take 1,000 mg by mouth in the morning and at bedtime. PT TAKES 2000 MG DAILY       MONOJECT INSULIN SYRINGE 31G X 5/16" 1 ML MISC See admin instructions.       mupirocin ointment (BACTROBAN) 2 % Apply 1 application topically daily.       nutrition supplement, JUVEN, (JUVEN) PACK Take 1 packet by mouth 2 (two) times daily between meals. 30 packet 2   nystatin cream (MYCOSTATIN) 1 APPLICATION TWICE A DAY EXTERNALLY       ONETOUCH VERIO test strip         pravastatin (PRAVACHOL) 40 MG tablet Take 1 tablet once a day for cholesterol       tamoxifen (NOLVADEX) 20 MG tablet TAKE ONE TABLET BY MOUTH EVERY MORNING 90 tablet 0      Home: Home Living Family/patient expects to be discharged to:: Private residence Living Arrangements: Alone Available Help at Discharge: Family, Available PRN/intermittently Type of Home: House Home Access: Stairs to enter Secretary/administrator of Steps: 5 Entrance Stairs-Rails: Right, Left Home Layout: One level Bathroom Shower/Tub: Health visitor: Handicapped height Home Equipment: Cane - single point, Agricultural consultant (2 wheels), Shower seat, Hand held shower head  Functional History: Prior Function Prior Level of Function : Independent/Modified Independent, Driving Mobility Comments: walked with cane out of the home, no AD inside ADLs Comments: sits to shower Functional Status:  Mobility: Bed Mobility Overal bed mobility: Needs Assistance Bed Mobility: Rolling, Sidelying to Sit Rolling: Max assist, +2 for physical assistance Sidelying to sit: Mod assist, +2 for physical assistance, HOB elevated Transfers Overall transfer level: Needs assistance Equipment used: Rolling walker (2 wheels) Transfers: Sit to/from Stand Sit to Stand: Min assist, +2 physical assistance,  From elevated surface General transfer comment: increased time to power up to standing Ambulation/Gait Ambulation/Gait assistance: Min guard, +2 physical assistance, +2 safety/equipment Gait Distance (Feet): 3 Feet Assistive device: Rolling walker (2 wheels) Gait Pattern/deviations: Step-to pattern, Decreased step length - right, Decreased step length - left, Decreased stride length General Gait Details: patient able to take a few steps from bed to recliner. Limited by fatigue Gait velocity: decr   ADL: ADL Overall ADL's : Needs assistance/impaired Eating/Feeding: Independent Grooming: Set up, Sitting Upper Body Bathing: Minimal assistance, Sitting Lower Body Bathing: Total assistance, +2 for physical assistance, Sit to/from stand Upper Body Dressing : Minimal assistance, Sitting Lower Body Dressing: Total assistance, Bed level Toilet Transfer: +2 for physical assistance, Minimal assistance, Rolling walker (2 wheels), Ambulation Functional mobility during ADLs: +2 for physical assistance, Minimal assistance   Cognition: Cognition Overall Cognitive Status: Within Functional Limits for tasks assessed Orientation Level: Oriented X4 Cognition Arousal/Alertness: Awake/alert Behavior During Therapy: Flat affect Overall Cognitive Status: Within Functional Limits for tasks assessed   Blood pressure (!) 182/55, pulse 77, temperature 98.5 F (36.9 C), temperature source Oral, resp. rate 16, height 5' 9.5" (1.765 m), weight 106.6 kg, SpO2 99 %. Physical Exam Constitutional:      General: She is not in acute distress.    Appearance: She is obese.  HENT:     Head: Normocephalic and atraumatic.     Nose: Nose normal.     Mouth/Throat:     Mouth: Mucous membranes are moist.  Eyes:     Conjunctiva/sclera: Conjunctivae normal.  Cardiovascular:     Rate and Rhythm: Normal rate.  Pulmonary:     Effort: Pulmonary effort is normal.  Abdominal:     Palpations:  Abdomen is soft.   Musculoskeletal:     Cervical back: Normal range of motion.     Right lower leg: Edema present.     Left lower leg: Edema present.     Comments: Right great toe amp. Severe hallux valgus left great toe. Right mid rib cage tender to palpation and movement of trunk or right arm.  Skin:    Comments: Callus left first MTP  Neurological:     Comments: Alert and oriented x 3. Reasonable insight and awareness. Fair Memory. Normal language and speech. Occasional processing delays. Cranial nerve exam unremarkable. MMT: UE nearly 4+ to 5/5 with pain inhibition d/t right ribs. BLE: 1+ to 2- HF, 2/5 KE and 4/5 ADF/PF. Stocking glove sensory loss in both legs up to knees. DTR's 1+, no abnl resting tone.    Psychiatric:     Comments: Pt slightly flat but otherwise appropriate and cooperative.         Lab Results Last 24 Hours       Results for orders placed or performed during the hospital encounter of 08/08/22 (from the past 24 hour(s))  CBG monitoring, ED     Status: Abnormal    Collection Time: 08/08/22  4:13 PM  Result Value Ref Range    Glucose-Capillary 191 (H) 70 - 99 mg/dL  Urinalysis, Routine w reflex microscopic -Urine, Clean Catch     Status: Abnormal    Collection Time: 08/08/22  6:52 PM  Result Value Ref Range    Color, Urine AMBER (A) YELLOW    APPearance CLOUDY (A) CLEAR    Specific Gravity, Urine 1.016 1.005 - 1.030    pH 6.0 5.0 - 8.0    Glucose, UA NEGATIVE NEGATIVE mg/dL    Hgb urine dipstick MODERATE (A) NEGATIVE    Bilirubin Urine NEGATIVE NEGATIVE    Ketones, ur NEGATIVE NEGATIVE mg/dL    Protein, ur >=161 (A) NEGATIVE mg/dL    Nitrite NEGATIVE NEGATIVE    Leukocytes,Ua LARGE (A) NEGATIVE    RBC / HPF 6-10 0 - 5 RBC/hpf    WBC, UA >50 0 - 5 WBC/hpf    Bacteria, UA MANY (A) NONE SEEN    Squamous Epithelial / HPF 6-10 0 - 5 /HPF    Non Squamous Epithelial 0-5 (A) NONE SEEN  Glucose, capillary     Status: Abnormal    Collection Time: 08/09/22  1:12 AM  Result Value  Ref Range    Glucose-Capillary 132 (H) 70 - 99 mg/dL  CBC     Status: Abnormal    Collection Time: 08/09/22  3:28 AM  Result Value Ref Range    WBC 9.5 4.0 - 10.5 K/uL    RBC 2.79 (L) 3.87 - 5.11 MIL/uL    Hemoglobin 9.1 (L) 12.0 - 15.0 g/dL    HCT 09.6 (L) 04.5 - 46.0 %    MCV 95.0 80.0 - 100.0 fL    MCH 32.6 26.0 - 34.0 pg    MCHC 34.3 30.0 - 36.0 g/dL    RDW 40.9 81.1 - 91.4 %    Platelets 204 150 - 400 K/uL    nRBC 0.0 0.0 - 0.2 %  Comprehensive metabolic panel     Status: Abnormal    Collection Time: 08/09/22  3:28 AM  Result Value Ref Range    Sodium 131 (L) 135 - 145 mmol/L    Potassium 3.5 3.5 - 5.1 mmol/L    Chloride 101 98 - 111 mmol/L    CO2  22 22 - 32 mmol/L    Glucose, Bld 146 (H) 70 - 99 mg/dL    BUN 40 (H) 8 - 23 mg/dL    Creatinine, Ser 1.61 (H) 0.44 - 1.00 mg/dL    Calcium 8.2 (L) 8.9 - 10.3 mg/dL    Total Protein 5.8 (L) 6.5 - 8.1 g/dL    Albumin 2.6 (L) 3.5 - 5.0 g/dL    AST 43 (H) 15 - 41 U/L    ALT 23 0 - 44 U/L    Alkaline Phosphatase 34 (L) 38 - 126 U/L    Total Bilirubin 1.4 (H) 0.3 - 1.2 mg/dL    GFR, Estimated 29 (L) >60 mL/min    Anion gap 8 5 - 15  Magnesium     Status: None    Collection Time: 08/09/22  3:28 AM  Result Value Ref Range    Magnesium 1.8 1.7 - 2.4 mg/dL  Phosphorus     Status: None    Collection Time: 08/09/22  3:28 AM  Result Value Ref Range    Phosphorus 3.9 2.5 - 4.6 mg/dL  Lipid panel     Status: Abnormal    Collection Time: 08/09/22  3:28 AM  Result Value Ref Range    Cholesterol 121 0 - 200 mg/dL    Triglycerides 096 <045 mg/dL    HDL 34 (L) >40 mg/dL    Total CHOL/HDL Ratio 3.6 RATIO    VLDL 29 0 - 40 mg/dL    LDL Cholesterol 58 0 - 99 mg/dL  Hemoglobin J8J     Status: Abnormal    Collection Time: 08/09/22  3:28 AM  Result Value Ref Range    Hgb A1c MFr Bld 7.0 (H) 4.8 - 5.6 %    Mean Plasma Glucose 154.2 mg/dL  Glucose, capillary     Status: Abnormal    Collection Time: 08/09/22  5:25 AM  Result Value Ref  Range    Glucose-Capillary 150 (H) 70 - 99 mg/dL  Glucose, capillary     Status: Abnormal    Collection Time: 08/09/22  9:05 AM  Result Value Ref Range    Glucose-Capillary 171 (H) 70 - 99 mg/dL  Glucose, capillary     Status: Abnormal    Collection Time: 08/09/22 11:19 AM  Result Value Ref Range    Glucose-Capillary 193 (H) 70 - 99 mg/dL       Imaging Results (Last 48 hours)  MR ANGIO HEAD WO CONTRAST   Result Date: 08/09/2022 CLINICAL DATA:  Stroke follow-up EXAM: MRA HEAD WITHOUT CONTRAST TECHNIQUE: Angiographic images of the Circle of Willis were acquired using MRA technique without intravenous contrast. COMPARISON:  None Available. FINDINGS: POSTERIOR CIRCULATION: --Vertebral arteries: Normal --Inferior cerebellar arteries: Normal. --Basilar artery: Normal. --Superior cerebellar arteries: Normal. --Posterior cerebral arteries: Normal. ANTERIOR CIRCULATION: --Intracranial internal carotid arteries: Normal. --Anterior cerebral arteries (ACA): Normal. --Middle cerebral arteries (MCA): Normal. Anatomic variants: None Other: None. IMPRESSION: Normal intracranial MRA. Electronically Signed   By: Deatra Robinson M.D.   On: 08/09/2022 01:06    MR BRAIN WO CONTRAST   Result Date: 08/08/2022 CLINICAL DATA:  Altered mental status EXAM: MRI HEAD WITHOUT CONTRAST TECHNIQUE: Multiplanar, multiecho pulse sequences of the brain and surrounding structures were obtained without intravenous contrast. COMPARISON:  06/05/2019 FINDINGS: Brain: Multifocal bilateral acute ischemia, predominantly affecting the deep gray matter. There also 2 cortical foci within the right MCA territory. There are old bilateral MCA territory infarcts. No acute or chronic hemorrhage. There is multifocal hyperintense T2-weighted  signal within the white matter. Generalized volume loss. The midline structures are normal. Vascular: Major flow voids are preserved. Skull and upper cervical spine: Normal calvarium and skull base. Visualized  upper cervical spine and soft tissues are normal. Sinuses/Orbits:No paranasal sinus fluid levels or advanced mucosal thickening. No mastoid or middle ear effusion. Normal orbits. IMPRESSION: 1. Multifocal bilateral acute ischemia, predominantly affecting the deep gray matter. There also 2 cortical foci within the right MCA territory. No hemorrhage or mass effect. 2. Old bilateral MCA territory infarcts. 3. Mild volume loss and chronic ischemic microangiopathy. Electronically Signed   By: Deatra Robinson M.D.   On: 08/08/2022 21:34    DG Ribs Unilateral W/Chest Right   Result Date: 08/08/2022 CLINICAL DATA:  De Hollingshead 3 times in the past week with right lower anterior rib pain EXAM: RIGHT RIBS AND CHEST - 3+ VIEW COMPARISON:  06/04/2010 FINDINGS: Suspected acute fracture of the anterior right eighth rib. There is no evidence of pneumothorax or pleural effusion. Both lungs are clear. Stable cardiomediastinal silhouette. Aortic atherosclerotic calcification. Cervical spine fusion hardware IMPRESSION: Suspected acute fracture of the anterior right eighth rib. Electronically Signed   By: Minerva Fester M.D.   On: 08/08/2022 20:14    CT Head Wo Contrast   Result Date: 08/08/2022 CLINICAL DATA:  Provided history: Polytrauma, blunt. Additional history provided: Fall. Patient found down. EXAM: CT HEAD WITHOUT CONTRAST CT CERVICAL SPINE WITHOUT CONTRAST TECHNIQUE: Multidetector CT imaging of the head and cervical spine was performed following the standard protocol without intravenous contrast. Multiplanar CT image reconstructions of the cervical spine were also generated. RADIATION DOSE REDUCTION: This exam was performed according to the departmental dose-optimization program which includes automated exposure control, adjustment of the mA and/or kV according to patient size and/or use of iterative reconstruction technique. COMPARISON:  Brain MRI 06/05/2019.  Head CT 05/14/2019. FINDINGS: CT HEAD FINDINGS Brain:  Generalized cerebral atrophy. Redemonstrated chronic cortically-based infarcts within the bilateral frontal lobes and left parietal lobe. 16 x 5 mm age-indeterminate infarct centered within the left corona radiata and caudate nucleus, new from the prior brain MRI of 06/05/2019 but otherwise age indeterminate. Background mild patchy and ill-defined hypoattenuation within the cerebral white matter, nonspecific but compatible with chronic small ischemic disease. There is no acute intracranial hemorrhage. No extra-axial fluid collection. No evidence of an intracranial mass. No midline shift. Vascular: No hyperdense vessel.  Atherosclerotic calcifications. Skull: No fracture or aggressive osseous lesion. Sinuses/Orbits: No mass or acute finding within the imaged orbits. No significant paranasal sinus disease. CT CERVICAL SPINE FINDINGS Alignment: No significant spondylolisthesis. Skull base and vertebrae: The basion-dental and atlanto-dental intervals are maintained.No evidence of acute fracture to the cervical spine. Soft tissues and spinal canal: No prevertebral fluid or swelling. No visible canal hematoma. Disc levels: Prior C5-C6 ACDF. Solid arthrodesis across the C5-C6 disc space. No evidence of acute hardware compromise. Background cervical spondylosis with multilevel disc space narrowing and disc bulges/central disc protrusions. Disc space narrowing is greatest at C4-C5 (moderately advanced at this level). No appreciable high-grade spinal canal stenosis. No significant bony neural foraminal narrowing. Upper chest: No consolidation within the imaged lung apices. No visible pneumothorax. IMPRESSION: CT head: 1. 16 x 5 mm infarct within the left corona radiata/caudate nucleus, new from the prior brain MRI of 06/05/2019 but otherwise age indeterminate. Consider a brain MRI for further evaluation. 2. No acute posttraumatic intracranial findings. 3. Redemonstrated chronic cortically-based infarcts within bilateral  frontal and left parietal lobes. 4. Background mild cerebral white matter  chronic small vessel ischemic disease. 5. Generalized cerebral atrophy. CT cervical spine: 1. No evidence of acute fracture to the cervical spine. 2. Prior C5-C6 ACDF. 3. Cervical spondylosis as described. Electronically Signed   By: Jackey Loge D.O.   On: 08/08/2022 14:05    CT Cervical Spine Wo Contrast   Result Date: 08/08/2022 CLINICAL DATA:  Provided history: Polytrauma, blunt. Additional history provided: Fall. Patient found down. EXAM: CT HEAD WITHOUT CONTRAST CT CERVICAL SPINE WITHOUT CONTRAST TECHNIQUE: Multidetector CT imaging of the head and cervical spine was performed following the standard protocol without intravenous contrast. Multiplanar CT image reconstructions of the cervical spine were also generated. RADIATION DOSE REDUCTION: This exam was performed according to the departmental dose-optimization program which includes automated exposure control, adjustment of the mA and/or kV according to patient size and/or use of iterative reconstruction technique. COMPARISON:  Brain MRI 06/05/2019.  Head CT 05/14/2019. FINDINGS: CT HEAD FINDINGS Brain: Generalized cerebral atrophy. Redemonstrated chronic cortically-based infarcts within the bilateral frontal lobes and left parietal lobe. 16 x 5 mm age-indeterminate infarct centered within the left corona radiata and caudate nucleus, new from the prior brain MRI of 06/05/2019 but otherwise age indeterminate. Background mild patchy and ill-defined hypoattenuation within the cerebral white matter, nonspecific but compatible with chronic small ischemic disease. There is no acute intracranial hemorrhage. No extra-axial fluid collection. No evidence of an intracranial mass. No midline shift. Vascular: No hyperdense vessel.  Atherosclerotic calcifications. Skull: No fracture or aggressive osseous lesion. Sinuses/Orbits: No mass or acute finding within the imaged orbits. No significant  paranasal sinus disease. CT CERVICAL SPINE FINDINGS Alignment: No significant spondylolisthesis. Skull base and vertebrae: The basion-dental and atlanto-dental intervals are maintained.No evidence of acute fracture to the cervical spine. Soft tissues and spinal canal: No prevertebral fluid or swelling. No visible canal hematoma. Disc levels: Prior C5-C6 ACDF. Solid arthrodesis across the C5-C6 disc space. No evidence of acute hardware compromise. Background cervical spondylosis with multilevel disc space narrowing and disc bulges/central disc protrusions. Disc space narrowing is greatest at C4-C5 (moderately advanced at this level). No appreciable high-grade spinal canal stenosis. No significant bony neural foraminal narrowing. Upper chest: No consolidation within the imaged lung apices. No visible pneumothorax. IMPRESSION: CT head: 1. 16 x 5 mm infarct within the left corona radiata/caudate nucleus, new from the prior brain MRI of 06/05/2019 but otherwise age indeterminate. Consider a brain MRI for further evaluation. 2. No acute posttraumatic intracranial findings. 3. Redemonstrated chronic cortically-based infarcts within bilateral frontal and left parietal lobes. 4. Background mild cerebral white matter chronic small vessel ischemic disease. 5. Generalized cerebral atrophy. CT cervical spine: 1. No evidence of acute fracture to the cervical spine. 2. Prior C5-C6 ACDF. 3. Cervical spondylosis as described. Electronically Signed   By: Jackey Loge D.O.   On: 08/08/2022 14:05       Assessment/Plan: Diagnosis: 72 year old female with functional deficits due to bi-cerebral infarcts in the setting of significant bilateral lower extremity edema and rhabdomyolysis. Does the need for close, 24 hr/day medical supervision in concert with the patient's rehab needs make it unreasonable for this patient to be served in a less intensive setting? Yes Co-Morbidities requiring supervision/potential complications:   -UTI/ID -AKI/nutritional status -Lower extremity edema -rhabdomyolysis -depression -hyponatremia -type 2 diabetes with diabetic peripheral neuropathy -obesity -right 8th rib fracture after fall   Due to bladder management, bowel management, safety, skin/wound care, disease management, medication administration, pain management, and patient education, does the patient require 24 hr/day rehab nursing? Yes Does  the patient require coordinated care of a physician, rehab nurse, therapy disciplines of PT, OT, SLP to address physical and functional deficits in the context of the above medical diagnosis(es)? Yes Addressing deficits in the following areas: balance, endurance, locomotion, strength, transferring, bowel/bladder control, bathing, dressing, feeding, grooming, toileting, cognition, and psychosocial support Can the patient actively participate in an intensive therapy program of at least 3 hrs of therapy per day at least 5 days per week? Yes The potential for patient to make measurable gains while on inpatient rehab is excellent Anticipated functional outcomes upon discharge from inpatient rehab are supervision  with PT, supervision and min assist with OT, modified independent with SLP. Estimated rehab length of stay to reach the above functional goals is: 13-19 days Anticipated discharge destination: Home Overall Rehab/Functional Prognosis: excellent   POST ACUTE RECOMMENDATIONS: This patient's condition is appropriate for continued rehabilitative care in the following setting: CIR Patient has agreed to participate in recommended program. Yes Note that insurance prior authorization may be required for reimbursement for recommended care.   Comment: Pt also has been dealing with significant depression which is one of the reasons she let things go and is now in the position she's in.  Sister is very supportive and available to help at home. She has HPOA. Rehab Admissions Coordinator to  follow up      MEDICAL RECOMMENDATIONS: Would benefit from neuropsychological counseling to address coping skills. Probably needs adjustment of her depression medication regimen as well.  Elevation and compression (ACE wrap) of lower extremities for edema control     I have personally performed a face to face diagnostic evaluation of this patient. Additionally, I have examined the patient's medical record including any pertinent labs and radiographic images. If the physician assistant has documented in this note, I have reviewed and edited or otherwise concur with the physician assistant's documentation.   Thanks,   Holly Oyster, MD 08/09/2022      Revision History  Routing History

## 2022-08-12 NOTE — Progress Notes (Signed)
Inpatient Rehabilitation Admissions Coordinator   I met with patient at bedside and she is in agreement to admit to CIR today. I will alert acute team and TOC and make the arrangements.  Ottie Glazier, RN, MSN Rehab Admissions Coordinator [phone] 08/12/2022 11:16 AM

## 2022-08-13 DIAGNOSIS — I63511 Cerebral infarction due to unspecified occlusion or stenosis of right middle cerebral artery: Secondary | ICD-10-CM | POA: Diagnosis not present

## 2022-08-13 LAB — GLUCOSE, CAPILLARY
Glucose-Capillary: 164 mg/dL — ABNORMAL HIGH (ref 70–99)
Glucose-Capillary: 175 mg/dL — ABNORMAL HIGH (ref 70–99)
Glucose-Capillary: 202 mg/dL — ABNORMAL HIGH (ref 70–99)

## 2022-08-13 MED ORDER — BACITRACIN ZINC 500 UNIT/GM EX OINT
TOPICAL_OINTMENT | Freq: Every day | CUTANEOUS | Status: AC
  Administered 2022-08-14 – 2022-08-17 (×2): 31.5 via TOPICAL
  Filled 2022-08-13: qty 28.4

## 2022-08-13 NOTE — Evaluation (Signed)
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   CKD (chronic kidney disease) 06/07/2016   Stage 2, GFR 60-89 ml/min   Diabetic polyneuropathy 06/07/2016   Dyslipidemia    Endometrial adenocarcinoma 2012   Fever blister    Hyperlipidemia    Hypertension, essential 06/07/2016   Major depressive disorder    Mixed dyslipidemia 06/07/2016   MRSA infection 10/2019   left foot wound   Obstructive sleep apnea 06/07/2016   does not use CPAP   Peripheral neuropathy    Retinopathy    Severe obesity    Skin ulcer of left foot with fat layer exposed 11/02/2016   Stroke    Asymptomatic, discovered via neuroimaging; small infarct in left parietal lobe; also concern for small b/l frontal infarcts   Tachycardia 07/19/2017   Type 2 diabetes mellitus with hyperglycemia, with long-term current use of insulin 06/07/2016   Unilateral primary osteoarthritis, right knee 04/16/2019   Past Surgical History:  Past Surgical History:  Procedure Laterality Date   ADRENALECTOMY     BREAST LUMPECTOMY WITH RADIOACTIVE SEED LOCALIZATION Left 02/25/2020   Procedure: LEFT BREAST LUMPECTOMY WITH RADIOACTIVE SEED LOCALIZATION;  Surgeon: Abigail Miyamoto, MD;  Location: [facility];  Service: General;  Laterality: Left;   BUNIONECTOMY     CARDIAC CATHETERIZATION     CERVICAL SPINE SURGERY     CHOLECYSTECTOMY     DILATION AND CURETTAGE OF UTERUS     LAPAROSCOPIC SALPINGOOPHERECTOMY     THORACOTOMY      TONSILLECTOMY      Assessment & Plan Clinical Impression: Patient is a 72 year old right-handed female with a 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  Per chart review patient lives alone.  1 level home 5 steps to entry.  Independent prior to admission but did use a cane when outside of the home.  Presented 08/08/2022 with progressive weakness and recurrent falls.  Per her sister after most recent fall patient sat on the floor for almost 10 hours and could not get up without assistance and was found covered in urine and feces..  Cranial CT scan showed a 16 x 5 mm infarct within the left corona radiata/caudate nucleus, new from prior brain MRI 06/05/2019 but otherwise age-indeterminate.  No acute posttraumatic intracranial findings.  Redemonstrated chronic cortically based infarcts within bilateral frontal and left parietal lobes.  CT cervical spine negative for fracture or dislocation.  MRI multifocal bilateral acute ischemia, predominantly affecting the deep gray matter.  There was also 2 cortical foci within the right MCA territory.  No hemorrhage or mass effect.  Old bilateral MCA territory infarcts. Chest X RAY suspect acute fracture right eighth rib. Patient did not receive TNK.  As well as swelling of the lower extremities however patient had not been taking her Lasix for several months. MRA of the head was unremarkable.  Admission chemistries unremarkable except sodium 134 glucose 172 BUN 45 creatinine 2.29, WBC 14,800, CK 1265, urine culture greater than 100,000 E. coli, hemoglobin A1c 7.0.  Carotid Dopplers with left 40 to 59% ICA stenosis.  Echocardiogram ejection fraction of 60 to 65% no wall motion abnormality grade 1 diastolic dysfunction.  Neurology follow-up patient placed on low-dose aspirin and  Plavix for CVA prophylaxis x 3 weeks then aspirin alone.  Recommendations of 30-day cardiac event monitor Lovenox added for DVT prophylaxis.  Placed on course of intravenous Rocephin for UTI.  Follow-up hemoglobin 08/11/2022 of 8.3 that has steadily declined from 12.52 weeks prior with no visible signs of bleeding initial concern for possible relation to hemoconcentration his WBC and platelets have also dropped.  FOBT pending and anemia panel.  His follow-up CBC the following day had improved to 9.5.  WOC consulted for left foot wound followed by podiatry Dr. Ardelle Anton and recently did see the patient 5/13 and debrided the wound as well as pressure injury right/left buttocks.  Therapy evaluations completed due to patient decreased functional mobility was admitted for a comprehensive rehab program.  Patient transferred to CIR on 08/12/2022 .   Patient currently requires min with mobility secondary to muscle weakness.  Prior to hospitalization, patient was min with mobility and lived with Alone in a House (Plans to discharge to sister's house) home.  Home access is 5Stairs to enter, Other (comment). Patient will benefit from skilled PT intervention to maximize safe functional mobility, minimize fall risk, and decrease caregiver burden for planned discharge home with intermittent assist.  Anticipate patient will benefit from follow up [facility] at discharge.   PT Evaluation Precautions/Restrictions Precautions Precautions: Fall Restrictions Weight Bearing Restrictions: No General   Vital SignsTherapy Vitals Pulse Rate: 65 BP: 102/64 Patient Position (if appropriate): Sitting Oxygen Therapy SpO2: 98 % Pain Pain Assessment Pain Scale: 0-10 Pain Score: 9  Pain Location: Rib cage Pain Orientation: Right Pain Descriptors / Indicators: Aching Patients Stated Pain Goal: 0 Pain Intervention(s): Repositioned;Ambulation/increased activity Multiple Pain Sites: No Pain Interference Pain Interference Pain  Effect on Sleep: 3. Frequently Pain Interference with Therapy Activities: 3. Frequently Pain Interference with Day-to-Day Activities: 2. Occasionally Home Living/Prior Functioning Home Living Available Help at Discharge: Family;Available 24 hours/day Type of Home: House (d/c location also house) Home Access: Stairs to enter;Other (comment) (d/c house has 5 STE) Entrance Stairs-Number of Steps: 5 Entrance Stairs-Rails: Right;Left (pt's home and d/c home have wide rails, cannot reach at same time) Home Layout: One level (d/c home also 2 levels, but pt will be on first level) Bathroom Shower/Tub: Walk-in shower;Other (comment);Tub/shower unit (d/c home has step in tub/shower) Bathroom Toilet: Handicapped height (d/c home has standard height) Bathroom Accessibility: Yes  Lives With: Alone Prior Function Level of Independence: Independent with basic ADLs;Independent with gait  Able to Take Stairs?: Yes Vision/Perception  Vision - History Ability to See in Adequate Light: 0 Adequate  Cognition Overall Cognitive Status: Within Functional Limits for tasks assessed Arousal/Alertness: Awake/alert Orientation Level: Oriented X4 Sensation Sensation Light Touch: Impaired by Purk assessment Light Touch Impaired Details: Impaired LLE;Impaired RLE Additional Comments: B LE neuropathy to B knees, but equally diminished on R and L Coordination [place] Motor Movements are Fluid and Coordinated: No Heel Shin Test: R WNL, L limited 2/2 hip fl strength Motor  Motor Motor: Within Functional Limits    Trunk/Postural Assessment  Cervical Assessment Cervical Assessment: Within Functional Limits Thoracic Assessment Thoracic Assessment: Within Functional Limits Lumbar Assessment Lumbar Assessment: Within Functional  Limits Postural Control Postural Control: Within Functional Limits  Balance Balance Balance Assessed: Yes Standardized Balance Assessment Standardized Balance Assessment: Berg  Balance Test Berg Balance Test Sit to Stand: Able to stand  independently using hands Standing Unsupported: Able to stand 2 minutes with supervision Sitting with Back Unsupported but Feet Supported on Floor or Stool: Able to sit safely and securely 2 minutes Stand to Sit: Controls descent by using hands Transfers: Needs one person to assist Standing Unsupported with Eyes Closed: Unable to keep eyes closed 3 seconds but stays steady Standing Ubsupported with Feet Together: Needs help to attain position but able to stand for 30 seconds with feet together From Standing, Reach Forward with Outstretched Arm: Reaches forward but needs supervision From Standing Position, Pick up Object from Floor: Unable to try/needs assist to keep balance From Standing Position, Turn to Look Behind Over each Shoulder: Turn sideways only but maintains balance Turn 360 Degrees: Needs assistance while turning Standing Unsupported, Alternately Place Feet on Step/Stool: Needs assistance to keep from falling or unable to try Standing Unsupported, One Foot in Front: Loses balance while stepping or standing Standing on One Leg: Unable to try or needs assist to prevent fall Total Score: 19 Extremity Assessment      RLE Assessment RLE Assessment: Exceptions to [facility] General Strength Comments: [place] 3+/5 LLE Assessment LLE Assessment: Exceptions to [facility] General Strength Comments: [place] 3+/5 except hip fl 2+/5 which pt reports is consistent with PLOF > last 5 years  Care Tool Care Tool Bed Mobility Roll left and right activity   Roll left and right assist level: Minimal Assistance - Patient > 75%    Sit to lying activity   Sit to lying assist level: Contact Guard/Touching assist    Lying to sitting on side of bed activity   Lying to sitting on side of bed assist level: the ability to move from lying on the back to sitting on the side of the bed with no back support.: Contact Guard/Touching assist     Care Tool  Transfers Sit to stand transfer   Sit to stand assist level: Minimal Assistance - Patient > 75%    Chair/bed transfer   Chair/bed transfer assist level: Minimal Assistance - Patient > 75%     Toilet transfer   Assist Level: Minimal Assistance - Patient > 75%    Car transfer   Car transfer assist level: Minimal Assistance - Patient > 75%      Care Tool Locomotion Ambulation   Assist level: Contact Guard/Touching assist Assistive device: Walker-rolling Max distance: 60  Walk 10 feet activity   Assist level: Contact Guard/Touching assist Assistive device: Walker-rolling   Walk 50 feet with 2 turns activity   Assist level: Contact Guard/Touching assist Assistive device: Walker-rolling  Walk 150 feet activity Walk 150 feet activity did not occur: Safety/medical concerns (2/2 fatigue)      Walk 10 feet on uneven surfaces activity   Assist level: Minimal Assistance - Patient > 75% Assistive device: Walker-rolling   Stairs   Assist level: Minimal Assistance - Patient > 75% Stairs assistive device: 2 hand rails Max number of stairs: 4  Walk up/down 1 step activity   Walk up/down 1 step (curb) assist level: Minimal Assistance - Patient > 75% Walk up/down 1 step or curb assistive device: Walker  Walk up/down 4 steps activity   Walk up/down 4 steps assist level: Minimal Assistance - Patient > 75% Walk up/down 4 steps assistive device: [employer] up/down  12 steps activity Walk up/down 12 steps activity did not occur: Safety/medical concerns (limited 2/2 endurance)       Pick up small objects from floor   Pick up small object from the floor assist level: Moderate Assistance - Patient 50 - 74% Pick up small object from the floor assistive device: RW  Wheelchair Is the patient using a wheelchair?: No   Wheelchair activity did not occur: N/A      Wheel 50 feet with 2 turns activity Wheelchair 50 feet with 2 turns activity did not occur: N/A    Wheel 150 feet activity  Wheelchair 150 feet activity did not occur: N/A      Refer to Care Plan for Long Term Goals  SHORT TERM GOAL WEEK 1 PT Short Term Goal 1 (Week 1): Pt will complete bed mobility with modI PT Short Term Goal 2 (Week 1): Pt will completed bed <> chair transfer with S + LRAD PT Short Term Goal 3 (Week 1): Pt will amb 1100ft with S + LRAD PT Short Term Goal 4 (Week 1): Pt will navigate 5 steps with CGA + unilateral rail  Recommendations for other services: None   Skilled Therapeutic Intervention Mobility Bed Mobility Bed Mobility: Rolling Left;Left Sidelying to Sit Rolling Right: Supervision/verbal cueing Rolling Left: Supervision/Verbal cueing Left Sidelying to Sit: Supervision/Verbal cueing Transfers Transfers: Sit to Stand;Stand to Sit;Stand Pivot Transfers Sit to Stand: Contact Guard/Touching assist Stand to Sit: Contact Guard/Touching assist Stand Pivot Transfers: Contact Guard/Touching assist Transfer (Assistive device): Rolling walker Locomotion  Gait Ambulation: Yes Gait Distance (Feet): 60 Feet Assistive device: Rolling walker Gait Gait: Yes Gait Pattern: Decreased step length - right;Decreased step length - left;Shuffle Gait velocity: decreased. Stairs / Additional Locomotion Stairs: Yes Stairs Assistance: Minimal Assistance - Patient > 75% Stair Management Technique: Two rails;Step to pattern Number of Stairs: 4 Ramp: Minimal Assistance - Patient >75% Curb: Minimal Assistance - Patient >75% Wheelchair Mobility Wheelchair Mobility: No  Session 1: Chart reviewed and pt agreeable to therapy. Pt received semi-reclined in bed with c/o pain as indicated above. Session focused on evaluation of functional mobility and initiation of amb training to promote safe home access. Pt initiated session with evaluation as described above. Pt then completed amb of 8ft using CGA + RW and VC to decrease shuffling. Pt then completed balance exercises of neutral stance and narrow stance  with minA + RW. Session education emphasized therapy goals, role of PT, fall prevention in hospital, and discharge planning. At end of session, pt was left seated in [facility] with alarm engaged, nurse call bell and all needs in reach.  Session 2: Chart reviewed and pt agreeable to therapy. Pt received semi-reclined in bed with no c/o pain. Session focused on locomotion practice to promote safe home access. Pt initiated session with amb of 17ft using CGA + RW. Pt noted to still have shuffle gait that required VC to mitigate fall risk. Pt then practiced stair navigation with pt completing series of 4 steps using MinA + B rails + step-to pattern. Pt educated on safe step pattern in setting on decreased L hip fl strength. Pt then educated on safe curb navigation with RW and pt completed curb step with minA + RW. Pt then completed practice of amb to car followed by amb up/down ramp using MinA + RW progressing to CGA + RW. Pt then completed 71ft + 56ft amb with CGA + RW and continued VC for safe step pattern. At end of session, pt was  left seated in recliner with alarm engaged, nurse call bell and all needs in reach.   Discharge Criteria: Patient will be discharged from PT if patient refuses treatment 3 consecutive times without medical reason, if treatment goals not met, if there is a change in medical status, if patient makes no progress towards goals or if patient is discharged from hospital.  The above assessment, treatment plan, treatment alternatives and goals were discussed and mutually agreed upon: by patient  Dionne Milo 08/13/2022, 1:41 PM

## 2022-08-13 NOTE — Consult Note (Signed)
WOC Nurse Consult Note: Reason for Consult:Left foot wound. Patient followed by Podiatric Medicine in the community and last seen by Dr. Ardelle Anton on 5/13 for wound debridement. She was seen in the acute care hospital by my associate D. Engels on 08/11/22 and she continued Dr Gabriel Rung POC. I will do the same for her stay on the inpatient Rehab unit. Wound type:neuropathic, left foot Pressure Injury POA: Yes Measurement:1.5cm x 0.6cm x 0.3cm Wound bed:90% red, 10% yellow slough. Drainage (amount, consistency, odor) small serous Periwound:dry callus Dressing procedure/placement/frequency: Left foot wound is to be cleansed daily and dressed with bacitracin ointment and silicone foam dressing. The buttocks, perineal area and posterior and medial thighs are to be cleansed daily and PRN incontinence and moisture barrier ointment applied. She is getting used to not having the external female urinary incontinence suction device and has some irritant contact dermatitis related to UI.    ICD-10 CM Codes for Irritant Dermatitis  L24A2 - Due to fecal, urinary or dual incontinence L24A9 - Due to friction or contact with other specified body fluids   A small skin tear noted on admission by Dr. Wynn Banker will be covered with xeroform gauze (antimicrobial, nonadherent) and topped with silicone foam daily, but PRN changes for soiling will also occur.  WOC nursing team will not follow, but will remain available to this patient, the nursing and medical teams.  Please re-consult if needed.  Thank you for inviting [place] to participate in this patient's Plan of Care.  Ladona Mow, MSN, RN, CNS, GNP, Leda Min, [employer], [employer] phone:  [phone]

## 2022-08-13 NOTE — Plan of Care (Signed)
Problem: RH Balance Goal: LTG Patient will maintain dynamic standing with ADLs (OT) Description: LTG:  Patient will maintain dynamic standing balance with assist during activities of daily living (OT)  Flowsheets (Taken 08/13/2022 1228) LTG: Pt will maintain dynamic standing balance during ADLs with: Independent with assistive device   Problem: Sit to Stand Goal: LTG:  Patient will perform sit to stand in prep for activites of daily living with assistance level (OT) Description: LTG:  Patient will perform sit to stand in prep for activites of daily living with assistance level (OT) Flowsheets (Taken 08/13/2022 1228) LTG: PT will perform sit to stand in prep for activites of daily living with assistance level: Independent with assistive device   Problem: RH Eating Goal: LTG Patient will perform eating w/assist, cues/equip (OT) Description: LTG: Patient will perform eating with assist, with/without cues using equipment (OT) Flowsheets (Taken 08/13/2022 1228) LTG: Pt will perform eating with assistance level of: Independent   Problem: RH Grooming Goal: LTG Patient will perform grooming w/assist,cues/equip (OT) Description: LTG: Patient will perform grooming with assist, with/without cues using equipment (OT) Flowsheets (Taken 08/13/2022 1228) LTG: Pt will perform grooming with assistance level of: Independent with assistive device    Problem: RH Bathing Goal: LTG Patient will bathe all body parts with assist levels (OT) Description: LTG: Patient will bathe all body parts with assist levels (OT) Flowsheets (Taken 08/13/2022 1228) LTG: Pt will perform bathing with assistance level/cueing: Independent with assistive device  LTG: Position pt will perform bathing: Shower   Problem: RH Dressing Goal: LTG Patient will perform upper body dressing (OT) Description: LTG Patient will perform upper body dressing with assist, with/without cues (OT). Flowsheets (Taken 08/13/2022 1228) LTG: Pt will  perform upper body dressing with assistance level of: Independent with assistive device Goal: LTG Patient will perform lower body dressing w/assist (OT) Description: LTG: Patient will perform lower body dressing with assist, with/without cues in positioning using equipment (OT) Flowsheets (Taken 08/13/2022 1228) LTG: Pt will perform lower body dressing with assistance level of: Independent with assistive device   Problem: RH Toileting Goal: LTG Patient will perform toileting task (3/3 steps) with assistance level (OT) Description: LTG: Patient will perform toileting task (3/3 steps) with assistance level (OT)  Flowsheets (Taken 08/13/2022 1228) LTG: Pt will perform toileting task (3/3 steps) with assistance level: Independent with assistive device   Problem: RH Simple Meal Prep Goal: LTG Patient will perform simple meal prep w/assist (OT) Description: LTG: Patient will perform simple meal prep with assistance, with/without cues (OT). Flowsheets (Taken 08/13/2022 1228) LTG: Pt will perform simple meal prep with assistance level of: Independent with assistive device LTG: Pt will perform simple meal prep w/level of: Ambulate with device   Problem: RH Toilet Transfers Goal: LTG Patient will perform toilet transfers w/assist (OT) Description: LTG: Patient will perform toilet transfers with assist, with/without cues using equipment (OT) Flowsheets (Taken 08/13/2022 1228) LTG: Pt will perform toilet transfers with assistance level of: Independent with assistive device   Problem: RH Tub/Shower Transfers Goal: LTG Patient will perform tub/shower transfers w/assist (OT) Description: LTG: Patient will perform tub/shower transfers with assist, with/without cues using equipment (OT) Flowsheets (Taken 08/13/2022 1228) LTG: Pt will perform tub/shower stall transfers with assistance level of: Independent with assistive device LTG: Pt will perform tub/shower transfers from: Walk in shower   Problem: RH  Attention Goal: LTG Patient will demonstrate this level of attention during functional activites (OT) Description: LTG:  Patient will demonstrate this level of attention during functional activites  (  OT) Flowsheets (Taken 08/13/2022 1228) Patient will demonstrate this level of attention during functional activites: Alternating Patient will demonstrate above attention level in the following environment: Home LTG: Patient will demonstrate this level of attention during functional activites (OT): Independent

## 2022-08-13 NOTE — Progress Notes (Signed)
PROGRESS NOTE   Subjective/Complaints:  No issues overnite except trying to get used to not having Purewick , no dysuria   ROS neg CP, SOB, N/V/D  Objective:   No results found. Recent Labs    08/12/22 0842 08/12/22 1652  WBC 4.9 6.1  HGB 9.5* 10.4*  HCT 28.7* 31.5*  PLT 201 337   Recent Labs    08/11/22 1230 08/12/22 0842 08/12/22 1652  NA 135 138  --   K 3.4* 3.8  --   CL 104 107  --   CO2 23 24  --   GLUCOSE 133* 180*  --   BUN 27* 26*  --   CREATININE 1.40* 1.33* 1.39*  CALCIUM 8.1* 8.4*  --    No intake or output data in the 24 hours ending 08/13/22 0842      Physical Exam: Vital Signs Blood pressure (!) 169/80, pulse 61, temperature 98 F (36.7 C), temperature source Oral, resp. rate 16, height 5\' 9"  (1.753 m), weight 108.3 kg, SpO2 100 %.   General: No acute distress Mood and affect are appropriate Heart: Regular rate and rhythm no rubs murmurs or extra sounds Lungs: Clear to auscultation, breathing unlabored, no rales or wheezes Abdomen: Positive bowel sounds, soft nontender to palpation, nondistended Extremities: No clubbing, cyanosis, or edema Skin: No evidence of breakdown, no evidence of rash Neurologic: Cranial nerves II through XII intact, motor strength is 5/5 in bilateral deltoid, bicep, tricep, grip,right 5/5 , left 3- hip flexor, knee extensors, ankle dorsiflexor and plantar flexor,  Sensory exam reduced sensation to light touch  lower extremities Cerebellar exam normal finger to nose to finger as well as heel to shin in bilateral upper and lower extremities Musculoskeletal: Full range of motion in all 4 extremities. No joint swelling   Assessment/Plan: 1. Functional deficits which require 3+ hours per day of interdisciplinary therapy in a comprehensive inpatient rehab setting. Physiatrist is providing close team supervision and 24 hour management of active medical problems listed  below. Physiatrist and rehab team continue to assess barriers to discharge/monitor patient progress toward functional and medical goals  Care Tool:  Bathing              Bathing assist       Upper Body Dressing/Undressing Upper body dressing        Upper body assist      Lower Body Dressing/Undressing Lower body dressing            Lower body assist       Toileting Toileting    Toileting assist       Transfers Chair/bed transfer  Transfers assist           Locomotion Ambulation   Ambulation assist              Walk 10 feet activity   Assist           Walk 50 feet activity   Assist           Walk 150 feet activity   Assist           Walk 10 feet on uneven surface  activity   Assist  Wheelchair     Assist               Wheelchair 50 feet with 2 turns activity    Assist            Wheelchair 150 feet activity     Assist          Blood pressure (!) 169/80, pulse 61, temperature 98 F (36.7 C), temperature source Oral, resp. rate 16, height 5\' 9"  (1.753 m), weight 108.3 kg, SpO2 100 %.  Medical Problem List and Plan: 1. Functional deficits secondary to right MCA territory infarcts on 08/08/2022 and left subcortical lacunar infarct.  Recommendations for 30-day cardiac event monitor             -patient may shower             -ELOS/Goals: 10-14 days, SPV PT/OT/SLP 2.  Antithrombotics: -DVT/anticoagulation:  Pharmaceutical: Lovenox             -antiplatelet therapy: Aspirin 81 mg daily and Plavix 75 mg daily x 3 weeks then aspirin alone 3. Pain Management: Oxycodone as needed 4. Mood/Behavior/Sleep: Lexapro 20 mg daily, Wellbutrin 300 mg daily             -antipsychotic agents: N/A 5. Neuropsych/cognition: This patient is capable of making decisions on her own behalf. 6. Skin/Wound Care: Skin care as advised to left foot wound.  Routine skin checks..  Follow-up outpatient  podiatry             - Mild sacral wound, small tear with minimal bleeding; cover and ensure no soiling   7. Fluids/Electrolytes/Nutrition: Routine in and outs with follow-up chemistries 8.  E. coli UTI.  Intravenous Rocephin 08/09/2022 9.  Hypertension.  Cozaar 50 mg daily, Coreg 6.25 mg twice daily.  Monitor with increased mobility Vitals:   08/12/22 2051 08/13/22 0535  BP: (!) 183/53 (!) 169/80  Pulse: 65 61  Resp: 18 16  Temp: 98.4 F (36.9 C) 98 F (36.7 C)  SpO2: 99% 100%    10.  Hyperlipidemia.  Lipitor 11.  Diabetes mellitus with peripheral neuropathy.  Hemoglobin A1c 7.0.  Currently on SSI and Semglee 15 units nightly.  Diabetic teaching.        Recent Labs    08/11/22 2120 08/12/22 0816 08/12/22 1145  GLUCAP 219* 166* 218*      12.  Obesity.  BMI 34.21.  Dietary follow-up 13.  AKI on CKD stage III.  Most recent creatinine 1.42 earlier this month.  Follow-up chemistries 14.  History of ductal carcinoma in situ of left breast.  Status postlumpectomy radioactive seed 2021.  Will discuss resuming Nolvadex.  Follow-up Dr. Pamelia Hoit 15.Right eighth rib fracture from fall.Conservative care  - Adding lidocaine patches to R chest 16.  Acute anemia.  Follow-up CBC.   17. Edema. TEDs (can cut out toes for wounds) and elevate legs in bed.  Given creatinine at baseline, consider addition of diuretic for edema if worsening.      LOS: 1 days A FACE TO FACE EVALUATION WAS PERFORMED  Erick Colace 08/13/2022, 8:42 AM

## 2022-08-13 NOTE — Evaluation (Signed)
Occupational Therapy Assessment and Plan  Patient Details  Name: [name] MRN: [id number] Date of Birth: Jan 08, 1951  OT Diagnosis: acute pain, muscle weakness (generalized), and swelling of limb Rehab Potential: Rehab Potential (ACUTE ONLY): Good ELOS: 10 days   Today's Date: 08/13/2022 OT Individual Time: 1914-7829 OT Individual Time Calculation (min): 75 min     Hospital Problem: Principal Problem:   Right middle cerebral artery stroke [facility])   Past Medical History:  Past Medical History:  Diagnosis Date   Breast cancer (HCC) 12/2019   left breast DCIS   CKD (chronic kidney disease) 06/07/2016   Stage 2, GFR 60-89 ml/min   Diabetic polyneuropathy 06/07/2016   Dyslipidemia    Endometrial adenocarcinoma 2012   Fever blister    Hyperlipidemia    Hypertension, essential 06/07/2016   Major depressive disorder    Mixed dyslipidemia 06/07/2016   MRSA infection 10/2019   left foot wound   Obstructive sleep apnea 06/07/2016   does not use CPAP   Peripheral neuropathy    Retinopathy    Severe obesity    Skin ulcer of left foot with fat layer exposed 11/02/2016   Stroke    Asymptomatic, discovered via neuroimaging; small infarct in left parietal lobe; also concern for small b/l frontal infarcts   Tachycardia 07/19/2017   Type 2 diabetes mellitus with hyperglycemia, with long-term current use of insulin 06/07/2016   Unilateral primary osteoarthritis, right knee 04/16/2019   Past Surgical History:  Past Surgical History:  Procedure Laterality Date   ADRENALECTOMY     BREAST LUMPECTOMY WITH RADIOACTIVE SEED LOCALIZATION Left 02/25/2020   Procedure: LEFT BREAST LUMPECTOMY WITH RADIOACTIVE SEED LOCALIZATION;  Surgeon: Abigail Miyamoto, MD;  Location: [facility];  Service: General;  Laterality: Left;   BUNIONECTOMY     CARDIAC CATHETERIZATION     CERVICAL SPINE SURGERY     CHOLECYSTECTOMY     DILATION AND CURETTAGE OF UTERUS     LAPAROSCOPIC SALPINGOOPHERECTOMY      THORACOTOMY     TONSILLECTOMY      Assessment & Plan Clinical Impression: 72 year old right-handed female with a 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 Per chart review patient lives alone. 1 level home 5 steps to entry. Independent prior to admission but did use a cane when outside of the home. Presented 08/08/2022 with progressive weakness and recurrent falls. Per her sister after most recent fall patient sat on the floor for almost 10 hours and could not get up without assistance and was found covered in urine and feces.. Cranial CT scan showed a 16 x 5 mm infarct within the left corona radiata/caudate nucleus, new from prior brain MRI 06/05/2019 but otherwise age-indeterminate. No acute posttraumatic intracranial findings. Redemonstrated chronic cortically based infarcts within bilateral frontal and left parietal lobes. CT cervical spine negative for fracture or dislocation. MRI multifocal bilateral acute ischemia, predominantly affecting the deep gray matter. There was also 2 cortical foci within the right MCA territory. No hemorrhage or mass effect. Old bilateral MCA territory infarcts. Chest X RAY suspect acute fracture right eighth rib. Patient did not receive TNK. As well as swelling of the lower extremities however patient had not been taking her Lasix for several months. MRA of the head was unremarkable. Admission chemistries unremarkable except sodium 134 glucose 172 BUN 45 creatinine 2.29, WBC 14,800,  CK 1265, urine culture greater than 100,000 E. coli, hemoglobin A1c 7.0. Carotid Dopplers with left 40 to 59% ICA stenosis. Echocardiogram ejection fraction of 60 to 65% no wall motion abnormality grade 1 diastolic dysfunction. Neurology follow-up patient placed on low-dose aspirin and Plavix for  CVA prophylaxis x 3 weeks then aspirin alone. Recommendations of 30-day cardiac event monitor Lovenox added for DVT prophylaxis. Placed on course of intravenous Rocephin for UTI. Follow-up hemoglobin 08/11/2022 of 8.3 that has steadily declined from 12.52 weeks prior with no visible signs of bleeding initial concern for possible relation to hemoconcentration his WBC and platelets have also dropped. FOBT pending and anemia panel. His follow-up CBC the following day had improved to 9.5. WOC consulted for left foot wound followed by podiatry Dr. Ardelle Anton and recently did see the patient 5/13 and debrided the wound as well as pressure injury right/left buttocks.  Patient transferred to CIR on 08/12/2022 .    Patient currently requires max with basic self-care skills secondary to muscle weakness, decreased cardiorespiratoy endurance, abnormal tone, and decreased standing balance and decreased balance strategies.  Prior to hospitalization, patient could complete BADL with independent .  Patient will benefit from skilled intervention to decrease level of assist with basic self-care skills and increase independence with basic self-care skills prior to discharge home with care partner.  Anticipate patient will require intermittent supervision and follow up outpatient.  OT - End of Session Activity Tolerance: Tolerates 10 - 20 min activity with multiple rests Endurance Deficit: Yes Endurance Deficit Description: very fatigued after shower OT Assessment Rehab Potential (ACUTE ONLY): Good OT Patient demonstrates impairments in the following area(s): Balance;Behavior;Edema;Endurance;Pain;Motor;Skin Integrity;Sensory;Safety OT Basic ADL's Functional Problem(s): Eating;Grooming;Bathing;Toileting;Dressing OT Advanced ADL's Functional Problem(s): Simple Meal Preparation OT Transfers Functional Problem(s): Toilet;Tub/Shower OT Additional Impairment(s): None OT Plan OT Intensity: Minimum of 1-2 x/day, 45 to 90  minutes OT Frequency: 5 out of 7 days OT Duration/Estimated Length of Stay: 10 days OT Treatment/Interventions: Balance/vestibular training;Discharge planning;Pain management;Self Care/advanced ADL retraining;Therapeutic Activities;UE/LE Coordination activities;Cognitive remediation/compensation;Disease mangement/prevention;Functional mobility training;Patient/family education;Skin care/wound managment;Therapeutic Exercise;DME/adaptive equipment instruction;UE/LE Strength taining/ROM OT Self Feeding Anticipated Outcome(s): Independent OT Basic Self-Care Anticipated Outcome(s): Mod Independent OT Toileting Anticipated Outcome(s): Mod Independent OT Bathroom Transfers Anticipated Outcome(s): Mod Independent OT Recommendation Patient destination: Home Follow Up Recommendations: Outpatient OT Equipment Recommended: To be determined   OT Evaluation Precautions/Restrictions  Precautions Precautions: Fall Required Braces or Orthoses: Other Brace Other Brace: Darco Left  - chronic wound Restrictions Weight Bearing Restrictions: No   Vital Signs Therapy Vitals Pulse Rate: 65 BP: 102/64 Patient Position (if appropriate): Sitting Oxygen Therapy SpO2: 98 % Pain Pain Assessment Pain Scale: 0-10 Pain Score: 5  Pain Type: Acute pain Pain Location: Rib cage Pain Orientation: Right Pain Descriptors / Indicators: Aching Pain Onset: With Activity Patients Stated Pain Goal: 0 Pain Intervention(s): Shower;Therapeutic touch;Repositioned Multiple Pain Sites: No Home Living/Prior Functioning Home Living Available Help at Discharge: Family, Available 24 hours/day Type of Home: House (Plans to discharge to sister's home) Home Access: Stairs to enter, Other (comment) Entrance Stairs-Number of Steps: 5 Entrance Stairs-Rails: Right, Left Home Layout: One level Bathroom Shower/Tub: Walk-in shower, Other (comment), Tub/shower unit Bathroom Toilet: Handicapped height Bathroom Accessibility: Yes   Lives With: Alone IADL History Current License: Yes Occupation: Retired Type of Occupation: Runner, broadcasting/film/video Leisure and Hobbies: enjoys drawing - reports loss of interest in activities over the years Prior Function Level of Independence: Independent with basic ADLs, Independent with gait  Able to Take Stairs?: Yes Driving: Yes Vocation:  Retired Gaffer: retired Animal nutritionist Baseline Vision/History: 1 Wears glasses;4 Cataracts Ability to See in Adequate Light: 0 Adequate Patient Visual Report: No change from baseline Vision Assessment?: No apparent visual deficits Perception  Perception: Within Functional Limits Praxis Praxis: Intact Cognition Cognition Overall Cognitive Status: Impaired/Different from baseline Arousal/Alertness: Lethargic Orientation Level: Person;Place;Situation Person: Oriented Place: Oriented Situation: Oriented Memory: Appears intact Attention: Sustained Sustained Attention: Appears intact Awareness: Appears intact Comments: Patient with history of depression, flat affect, fatigue- somewhat delayed processing this session Brief Interview for Mental Status (BIMS) Repetition of Three Words (First Attempt): 3 Temporal Orientation: Year: Correct Temporal Orientation: Month: Accurate within 5 days Temporal Orientation: Day: Correct Recall: "Sock": Yes, no cue required Recall: "Blue": Yes, no cue required Recall: "Bed": Yes, no cue required BIMS Summary Score: 15 Sensation Sensation Light Touch: Appears Intact Peripheral sensation comments: BUE Hot/Cold: Appears Intact Proprioception: Appears Intact Stereognosis: Not tested Additional Comments: B LE neuropathy to B knees, but equally diminished on R and L Coordination Neis Motor Movements are Fluid and Coordinated: No Fine Motor Movements are Fluid and Coordinated: No Motor  Motor Motor: Abnormal postural alignment and control Motor - Skilled Clinical Observations: Mild forward flexed  posture at rest  Trunk/Postural Assessment  Cervical Assessment Cervical Assessment: Within Functional Limits Thoracic Assessment Thoracic Assessment: Within Functional Limits Lumbar Assessment Lumbar Assessment: Within Functional Limits Postural Control Postural Control: Within Functional Limits  Balance Balance Balance Assessed: Yes Standardized Balance Assessment Standardized Balance Assessment: Berg Balance Test Berg Balance Test Sit to Stand: Able to stand  independently using hands Standing Unsupported: Able to stand 2 minutes with supervision Sitting with Back Unsupported but Feet Supported on Floor or Stool: Able to sit safely and securely 2 minutes Stand to Sit: Controls descent by using hands Transfers: Needs one person to assist Standing Unsupported with Eyes Closed: Unable to keep eyes closed 3 seconds but stays steady Standing Ubsupported with Feet Together: Needs help to attain position but able to stand for 30 seconds with feet together From Standing, Reach Forward with Outstretched Arm: Reaches forward but needs supervision From Standing Position, Pick up Object from Floor: Unable to try/needs assist to keep balance From Standing Position, Turn to Look Behind Over each Shoulder: Turn sideways only but maintains balance Turn 360 Degrees: Needs assistance while turning Standing Unsupported, Alternately Place Feet on Step/Stool: Needs assistance to keep from falling or unable to try Standing Unsupported, One Foot in Front: Loses balance while stepping or standing Standing on One Leg: Unable to try or needs assist to prevent fall Total Score: 19 Static Sitting Balance Static Sitting - Balance Support: No upper extremity supported;Feet supported Static Sitting - Level of Assistance: 7: Independent Dynamic Sitting Balance Dynamic Sitting - Balance Support: No upper extremity supported;During functional activity Dynamic Sitting - Level of Assistance: 5: Stand by  assistance Dynamic Sitting Balance - Compensations: keeps one hand in support Static Standing Balance Static Standing - Balance Support: Right upper extremity supported;Left upper extremity supported;During functional activity Static Standing - Level of Assistance: 4: Min assist Dynamic Standing Balance Dynamic Standing - Balance Support: Right upper extremity supported;Left upper extremity supported;During functional activity Dynamic Standing - Level of Assistance: 4: Min assist Dynamic Standing - Balance Activities: Lateral lean/weight shifting;Forward lean/weight shifting;Reaching across midline Extremity/Trunk Assessment RUE Assessment RUE Assessment: Within Functional Limits LUE Assessment LUE Assessment: Within Functional Limits  Care Tool Care Tool Self Care Eating   Eating Assist Level: Supervision/Verbal cueing    Oral Care  Oral Care Assist Level: Minimal Assistance - Patient > 75%    Bathing   Body parts bathed by patient: Right arm;Left arm;Chest;Abdomen;Front perineal area;Buttocks;Right upper leg;Left upper leg;Right lower leg;Left lower leg;Face     Assist Level: Minimal Assistance - Patient > 75%    Upper Body Dressing(including orthotics)   What is the patient wearing?: Pull over shirt   Assist Level: Set up assist    Lower Body Dressing (excluding footwear)   What is the patient wearing?: Underwear/pull up;Pants Assist for lower body dressing: Moderate Assistance - Patient 50 - 74%    Putting on/Taking off footwear   What is the patient wearing?: Socks;Shoes;Orthosis;Ted hose Assist for footwear: Maximal Assistance - Patient 25 - 49%       Care Tool Toileting Toileting activity   Assist for toileting: Minimal Assistance - Patient > 75%     Care Tool Bed Mobility Roll left and right activity   Roll left and right assist level: Minimal Assistance - Patient > 75%    Sit to lying activity   Sit to lying assist level: Contact Guard/Touching assist     Lying to sitting on side of bed activity   Lying to sitting on side of bed assist level: the ability to move from lying on the back to sitting on the side of the bed with no back support.: Contact Guard/Touching assist     Care Tool Transfers Sit to stand transfer   Sit to stand assist level: Minimal Assistance - Patient > 75%    Chair/bed transfer   Chair/bed transfer assist level: Minimal Assistance - Patient > 75%     Toilet transfer   Assist Level: Minimal Assistance - Patient > 75%     Care Tool Cognition  Expression of Ideas and Wants Expression of Ideas and Wants: 3. Some difficulty - exhibits some difficulty with expressing needs and ideas (e.g, some words or finishing thoughts) or speech is not clear  Understanding Verbal and Non-Verbal Content Understanding Verbal and Non-Verbal Content: 4. Understands (complex and basic) - clear comprehension without cues or repetitions   Memory/Recall Ability Memory/Recall Ability : Current season;That he or she is in a hospital/hospital unit   Refer to Care Plan for Long Term Goals  SHORT TERM GOAL WEEK 1 OT Short Term Goal 1 (Week 1): Patient will dress self with set up assist OT Short Term Goal 2 (Week 1): Patient will toilet self with supervision/ set up OT Short Term Goal 3 (Week 1): Patient will bathe self with set up assistance  Recommendations for other services: Neuropsych   Skilled Therapeutic Intervention Patient received seated in wheelchair, eager to wash hair.  Patient completes ADL as stated below.  Patient reports recent history of multiple falls, and depression.  Stopped taking medication.  Patient moving well, yet fatigues with activity.  Able to walk to shower with her rolling walker.  Patient with chronic wound on left foot - and articulate care of wound.  Denies vision changes.  Difficult to ascertain cognitive deficit versus mood.   Patient returned to bed at end of session.  Bed alarm engaged and call bell/  personal items in reach.    ADL ADL Eating: Minimal assistance Where Assessed-Eating: Chair Grooming: Minimal assistance Where Assessed-Grooming: Chair Upper Body Bathing: Setup Where Assessed-Upper Body Bathing: Shower Lower Body Bathing: Minimal assistance Where Assessed-Lower Body Bathing: Shower Upper Body Dressing: Setup Where Assessed-Upper Body Dressing: Chair Lower Body Dressing: Maximal assistance Where Assessed-Lower Body Dressing: Sitting at  sink;Standing at sink Toileting: Minimal assistance Where Assessed-Toileting: Teacher, adult education: Curator Method: Proofreader: Raised toilet seat Tub/Shower Transfer: Unable to assess Tub/Shower Transfer Method: Unable to assess Film/video editor: Minimal assistance Film/video editor Method: Designer, industrial/product: Emergency planning/management officer ADL Comments: limited by fatigue at end of session - vitals stable Mobility  Bed Mobility Bed Mobility: Sit to Supine Sit to Supine: Minimal Assistance - Patient > 75% Transfers Sit to Stand: Minimal Assistance - Patient > 75% Stand to Sit: Minimal Assistance - Patient > 75%   Discharge Criteria: Patient will be discharged from OT if patient refuses treatment 3 consecutive times without medical reason, if treatment goals not met, if there is a change in medical status, if patient makes no progress towards goals or if patient is discharged from hospital.  The above assessment, treatment plan, treatment alternatives and goals were discussed and mutually agreed upon: by patient  Collier Salina 08/13/2022, 12:53 PM

## 2022-08-14 LAB — GLUCOSE, CAPILLARY
Glucose-Capillary: 183 mg/dL — ABNORMAL HIGH (ref 70–99)
Glucose-Capillary: 201 mg/dL — ABNORMAL HIGH (ref 70–99)
Glucose-Capillary: 201 mg/dL — ABNORMAL HIGH (ref 70–99)
Glucose-Capillary: 178 mg/dL — ABNORMAL HIGH (ref 70–99)

## 2022-08-14 MED ORDER — INSULIN GLARGINE-YFGN 100 UNIT/ML ~~LOC~~ SOLN
18.0000 [IU] | Freq: Every day | SUBCUTANEOUS | Status: DC
  Administered 2022-08-14 – 2022-08-15 (×2): 18 [IU] via SUBCUTANEOUS
  Filled 2022-08-14 (×3): qty 0.18

## 2022-08-14 NOTE — Progress Notes (Signed)
Physical Therapy Session Note  Patient Details  Name: [name] MRN: [id number] Date of Birth: [birth year]  Today's Date: 08/14/2022 PT Individual Time: 0904-0959 PT Individual Time Calculation (min): 55 min   Short Term Goals: Week 1:  PT Short Term Goal 1 (Week 1): Pt will complete bed mobility with modI PT Short Term Goal 2 (Week 1): Pt will completed bed <> chair transfer with S + LRAD PT Short Term Goal 3 (Week 1): Pt will amb 166ft with S + LRAD PT Short Term Goal 4 (Week 1): Pt will navigate 5 steps with CGA + unilateral rail  Skilled Therapeutic Interventions/Progress Updates:    Chart reviewed and pt agreeable to therapy. Pt received seated in WC as direct handoff from OT with no c/o pain. Also of note, pt at sink finishing self-care. Session focused on dynamic balance with self-care, stair training, and amb quality to promote fall prevention and safe return to home mobility. Pt initiated session with seated changing of bandage (per pre-hospital care), socks, and shoes with set up A. Pt then transferred to therapy gym for time management. In gym, pt navigated 3x4 steps with seated rest breaks between using CGA + unilateral rail showing progress towards therapy goals. Pt then completed amb of 242ft with CGA + RW. Gait noted to still display shuffling and narrow stance, but pt able to verbalize awareness and correct without VC of PT. Pt then verbalized urgent need for toileitng. Pt amb to toilet with CGA + RW, but pt still displayed incontinence before reaching toilet. Pt able to change LB clothing with set up assist. At end of session, pt was left semi-reclined in bed with alarm engaged, nurse call bell and all needs in reach.     Therapy Documentation Precautions:  Precautions Precautions: Fall Required Braces or Orthoses: Other Brace Other Brace: Darco Left  - chronic wound Restrictions Weight Bearing Restrictions: No    Therapy/Group: Individual Therapy  Dionne Milo, PT, DPT 08/14/2022, 12:12 PM

## 2022-08-14 NOTE — Progress Notes (Signed)
Occupational Therapy Session Note  Patient Details  Name: [name] MRN: [id number] Date of Birth: 24-Apr-1950  Today's Date: 08/14/2022 OT Individual Time: 4098-1191 OT Individual Time Calculation (min): 59 min    Short Term Goals: Week 1:  OT Short Term Goal 1 (Week 1): Patient will dress self with set up assist OT Short Term Goal 2 (Week 1): Patient will toilet self with supervision/ set up OT Short Term Goal 3 (Week 1): Patient will bathe self with set up assistance  Skilled Therapeutic Interventions/Progress Updates: Patient received sitting at the EOB. Reported being up and down much of the night and was fatigued but agreeable to participate with ADL's. Patient able to ambulate with RW to perform toilet and shower stall transfers. See below for full list of ADL levels. Patient needing increased time and frequent rest breaks due to fatigue,but performed with minimal assistance needs. Patient very motivated to regain full independence and continued to progress towards POC LTG's.     Therapy Documentation Precautions:  Precautions Precautions: Fall Required Braces or Orthoses: Other Brace Other Brace: Darco Left  - chronic wound Restrictions Weight Bearing Restrictions: No General:   Vital Signs: Therapy Vitals Temp: 97.7 F (36.5 C) Temp Source: Oral Pulse Rate: (!) 58 Resp: 18 BP: (!) 153/47 Patient Position (if appropriate): Lying Oxygen Therapy SpO2: 97 % O2 Device: Room Air Pain: Pain Assessment Pain Scale: 0-10 Pain Score: 0-No pain ADL: ADL Eating: Minimal assistance Where Assessed-Eating: Chair Grooming: Minimal assistance Where Assessed-Grooming: Chair Upper Body Bathing: Setup Where Assessed-Upper Body Bathing: Shower Lower Body Bathing: CGA for standing Where Assessed-Lower Body Bathing: Shower Upper Body Dressing: Setup Where Assessed-Upper Body Dressing: Chair Lower Body Dressing: Moderate assistance Where Assessed-Lower Body Dressing:  Sitting at sink, Standing at sink Toileting: Contact Guard assistance Where Assessed-Toileting: Teacher, adult education: Curator Method: Proofreader: Raised toilet seat Film/video editor: Minimal assistance Film/video editor Method: Designer, industrial/product: Personnel officer- Good - balance standing with RW    Therapy/Group: Individual Therapy  Warnell Forester 08/14/2022, 12:10 PM

## 2022-08-15 DIAGNOSIS — I63511 Cerebral infarction due to unspecified occlusion or stenosis of right middle cerebral artery: Secondary | ICD-10-CM | POA: Diagnosis not present

## 2022-08-15 LAB — CBC WITH DIFFERENTIAL/PLATELET
Abs Immature Granulocytes: 0.05 10*3/uL (ref 0.00–0.07)
Basophils Absolute: 0 10*3/uL (ref 0.0–0.1)
Basophils Relative: 1 %
Eosinophils Absolute: 0.2 10*3/uL (ref 0.0–0.5)
Eosinophils Relative: 3 %
HCT: 27.7 % — ABNORMAL LOW (ref 36.0–46.0)
Hemoglobin: 9 g/dL — ABNORMAL LOW (ref 12.0–15.0)
Immature Granulocytes: 1 %
Lymphocytes Relative: 41 %
Lymphs Abs: 2.1 10*3/uL (ref 0.7–4.0)
MCH: 32.1 pg (ref 26.0–34.0)
MCHC: 32.5 g/dL (ref 30.0–36.0)
MCV: 98.9 fL (ref 80.0–100.0)
Monocytes Absolute: 0.6 10*3/uL (ref 0.1–1.0)
Monocytes Relative: 11 %
Neutro Abs: 2.3 10*3/uL (ref 1.7–7.7)
Neutrophils Relative %: 43 %
Platelets: 254 10*3/uL (ref 150–400)
RBC: 2.8 MIL/uL — ABNORMAL LOW (ref 3.87–5.11)
RDW: 13.5 % (ref 11.5–15.5)
WBC: 5.2 10*3/uL (ref 4.0–10.5)
nRBC: 0 % (ref 0.0–0.2)

## 2022-08-15 LAB — COMPREHENSIVE METABOLIC PANEL
ALT: 20 U/L (ref 0–44)
AST: 15 U/L (ref 15–41)
Albumin: 2.5 g/dL — ABNORMAL LOW (ref 3.5–5.0)
Alkaline Phosphatase: 36 U/L — ABNORMAL LOW (ref 38–126)
Anion gap: 10 (ref 5–15)
BUN: 37 mg/dL — ABNORMAL HIGH (ref 8–23)
CO2: 24 mmol/L (ref 22–32)
Calcium: 8.3 mg/dL — ABNORMAL LOW (ref 8.9–10.3)
Chloride: 104 mmol/L (ref 98–111)
Creatinine, Ser: 1.64 mg/dL — ABNORMAL HIGH (ref 0.44–1.00)
GFR, Estimated: 33 mL/min — ABNORMAL LOW (ref >60)
Glucose, Bld: 165 mg/dL — ABNORMAL HIGH (ref 70–99)
Potassium: 3.8 mmol/L (ref 3.5–5.1)
Sodium: 138 mmol/L (ref 135–145)
Total Bilirubin: 0.6 mg/dL (ref 0.3–1.2)
Total Protein: 6.1 g/dL — ABNORMAL LOW (ref 6.5–8.1)

## 2022-08-15 LAB — GLUCOSE, CAPILLARY
Glucose-Capillary: 160 mg/dL — ABNORMAL HIGH (ref 70–99)
Glucose-Capillary: 158 mg/dL — ABNORMAL HIGH (ref 70–99)
Glucose-Capillary: 170 mg/dL — ABNORMAL HIGH (ref 70–99)
Glucose-Capillary: 231 mg/dL — ABNORMAL HIGH (ref 70–99)

## 2022-08-15 NOTE — Progress Notes (Signed)
Physical Therapy Session Note  Patient Details  Name: [name] MRN: [id number] Date of Birth: April 27, 1950  Today's Date: 08/15/2022 PT Individual Time: 0800-0845 PT Individual Time Calculation (min): 45 min   Short Term Goals: Week 1:  PT Short Term Goal 1 (Week 1): Pt will complete bed mobility with modI PT Short Term Goal 2 (Week 1): Pt will completed bed <> chair transfer with S + LRAD PT Short Term Goal 3 (Week 1): Pt will amb 134ft with S + LRAD PT Short Term Goal 4 (Week 1): Pt will navigate 5 steps with CGA + unilateral rail  Skilled Therapeutic Interventions/Progress Updates:    Pt received on [facility] with nsg present. No complaint of pain. Extended time for toileting during session, but very small urine and smear bowel void only. Max A for 3/3 toileting tasks for thoroughness. Later in session, pt assisted back to [facility], and had continent BM and bladder void, documented in flow sheet. Extended time for toileting during session, pt with good sitting balance on commode. Therapist also replaced soiled dressing on bottom, nsg made aware.  Stand pivot transfer x 4 with RW and CGA. Cueing for RW safety and hand placement. Supine<>sit x 2 with supervision. Sit to stand with CGA and RW, even as pt became more fatigued. Pt returned to bed after session and was left with all needs in reach and alarm active.    Therapy Documentation Precautions:  Precautions Precautions: Fall Required Braces or Orthoses: Other Brace Other Brace: Darco Left  - chronic wound Restrictions Weight Bearing Restrictions: No General:       Therapy/Group: Individual Therapy  Juluis Rainier 08/15/2022, 8:24 AM

## 2022-08-15 NOTE — Progress Notes (Signed)
[name], female   DOB: 1950/12/18, 72 y.o.   MRN: [id number]  49- SW spoke with pt sister [name] tp introduce self, explain role, and discuss discharge process. SW informed on ELOS, and discussed family edu. Fam edu on Friday (5/31) 8am-12pm. Sister confirms pt will d/c to her home and amenable to providing wound care if needed. She is aware final discharge recommendations will be discussed and SW will follow-up after team conference.  Cecile Sheerer, MSW, LCSWA Office: [phone] Cell: [phone] Fax: [phone]

## 2022-08-15 NOTE — Progress Notes (Signed)
Encourage and reinforced education in fluid intake. Water at bedside. (6 bottles)    Tilden Dome, LPN

## 2022-08-15 NOTE — Progress Notes (Signed)
Health care power of Attorney and Living will documents placed in shadow chart.    Tilden Dome, LPN

## 2022-08-15 NOTE — Progress Notes (Signed)
Occupational Therapy Session Note  Patient Details  Name: [name] MRN: [id number] Date of Birth: 01/25/1951  Today's Date: 08/15/2022 OT Individual Time: 0950-1100 OT Individual Time Calculation (min): 70 min    Short Term Goals: Week 1:  OT Short Term Goal 1 (Week 1): Patient will dress self with set up assist OT Short Term Goal 2 (Week 1): Patient will toilet self with supervision/ set up OT Short Term Goal 3 (Week 1): Patient will bathe self with set up assistance  Skilled Therapeutic Interventions/Progress Updates:    Pt greeted semi-reclined in bed asleep, easy to wake, and agreeable to OT treatment session. Pt declined need to wash up, but wanted to don underwear and pants. Pt completed bed mobility with min A. OT educated on modified technique for dressing strategies with pt demonstrating understanding. Pt able to get weaker LLE into figure 4 position to thread pant legs. Sit<>stands with CGA to pull up underwear and pants. Pt donned socks, shoes, and L off-loading shoe with supervision. Noted pt;s RW from home to be too short. OT obtained taller RW for pt and adjusted it to height. Pt ambulated to sink with CGA. Addressed standing balance/endurance with standing grooming tasks. Pt then reported fatigue and need to rest. Pt brought down to therapy gym in wc for time management. UB strength/endurance with Sci[employer] arm bike. 6 minutes forwards and 6 minutes backwards. Addressed functional ambulation w. RW back to the room 70 feet. Pt left seated in recliner with alarm belt on, call bell in reach, and needs met.    Therapy Documentation Precautions:  Precautions Precautions: Fall Required Braces or Orthoses: Other Brace Other Brace: Darco Left  - chronic wound Restrictions Weight Bearing Restrictions: No Pain: Denies pain   Therapy/Group: Individual Therapy  [name] 08/15/2022, 10:45 AM

## 2022-08-15 NOTE — Progress Notes (Signed)
Physical Therapy Session Note  Patient Details  Name: [name] MRN: [id number] Date of Birth: Aug 13, 1950  Today's Date: 08/15/2022 PT Individual Time: 1305-1420 PT Individual Time Calculation (min): 75 min   Short Term Goals: Week 1:  PT Short Term Goal 1 (Week 1): Pt will complete bed mobility with modI PT Short Term Goal 2 (Week 1): Pt will completed bed <> chair transfer with S + LRAD PT Short Term Goal 3 (Week 1): Pt will amb 113ft with S + LRAD PT Short Term Goal 4 (Week 1): Pt will navigate 5 steps with CGA + unilateral rail  Skilled Therapeutic Interventions/Progress Updates:  Patient greeted sitting upright in bedside recliner in her room with CSW present and agreeable to PT treatment session. Patient stood from recliner with RW and CGA- Patient then gait trained from her room to main gym with RW and CGA. VC throughout for improved postural extension, stepping within the RW, forward gaze, improved postural extension and increased B step length/foot clearance. Patient notably fatigued at end of gait trial and required extended seated rest break.   Patient performed sit/stand 2 x 5 with RW and CGA/MinA- VC and demonstration for increased anterior weight shift throughout stand in order to improve ease and ability with each stand. Patient was able to complete 6/10 with the use of her hands, however multiple attempts required. Patient unable to perform ten stands consecutively secondary to fatigue.   Patient tasked with standing with RW while performing alternating foot taps to 4-5" step with 4# weights donned to B ankles and CGA/MinA from therapist- Patient only able to complete x4 on each LE and then required an abrupt seated rest break secondary to significant fatigue and inability to remove Lt LE from step secondary to weakness. -Weight removed from Lt LE and patient performed the same activity as above, x5 each LE.   Patient wheeled back to her room via wheelchair for time  management and energy conservation secondary to fatigue. Patient stood from wheelchair with RW and CGA- VC for proper hand placement with inconsistent carryover noted. Patient performed stand pivot transfer to EOB with RW and CGA with VC for properly aligning herself with the bed prior to sitting. Patient transitioned to supine with supv. Patient left supine in bed with bed alarm on, call bell within reach, NT present and all needs met.    Therapy Documentation Precautions:  Precautions Precautions: Fall Required Braces or Orthoses: Other Brace Other Brace: Darco Left  - chronic wound Restrictions Weight Bearing Restrictions: No   Pain: Minor LB pain 8/10, improved with chair with LB support and positioning.    Therapy/Group: Individual Therapy  Cambri Plourde 08/15/2022, 9:57 AM

## 2022-08-15 NOTE — Progress Notes (Signed)
PROGRESS NOTE   Subjective/Complaints:  No acute complaints. No events overnight,   Patient doing well this a.m., states that she had a somewhat rough weekend but no ongoing issues.  No pain over bruising and left knee.  Sleeping well, eating well.  Discussed them increasing fluid intake, she is agreeable.  ROS: Denies fevers, chills, N/V, abdominal pain, constipation, diarrhea, SOB, cough, chest pain, new weakness or paraesthesias.    Objective:   No results found. Recent Labs    08/12/22 1652 08/15/22 0542  WBC 6.1 5.2  HGB 10.4* 9.0*  HCT 31.5* 27.7*  PLT 337 254    Recent Labs    08/12/22 0842 08/12/22 1652 08/15/22 0542  NA 138  --  138  K 3.8  --  3.8  CL 107  --  104  CO2 24  --  24  GLUCOSE 180*  --  165*  BUN 26*  --  37*  CREATININE 1.33* 1.39* 1.64*  CALCIUM 8.4*  --  8.3*     Intake/Output Summary (Last 24 hours) at 08/15/2022 8119 Last data filed at 08/15/2022 0500 [place] per 24 hour  Intake 960 ml  Output 675 ml  Net 285 ml        Physical Exam: Vital Signs Blood pressure 136/63, pulse 63, temperature 97.7 F (36.5 C), temperature source Oral, resp. rate 18, height 5\' 9"  (1.753 m), weight 103.7 kg, SpO2 95 %.   General: No acute distress.  Laying in bed. +obese Mood and affect are appropriate Heart: Regular rate and rhythm no rubs murmurs or extra sounds Lungs: Clear to auscultation, breathing unlabored, no rales or wheezes Abdomen: Positive bowel sounds, soft nontender to palpation, nondistended Extremities: No clubbing, cyanosis. + BL LE edema 3+ Skin: + R MTP wound - chronic, stable, covered in mepilex + Buttocks skin tear - stable Neurologic: AAOx4. Cranial nerves II through XII intact,  motor strength is 5/5 BL UE , Bilateral lower extremities 3 out of 5 proximally, 4 out of 5 distally  Sensory exam reduced sensation to light touch  lower extremities to the knees Cerebellar  exam normal finger to nose to finger as well as heel to shin in bilateral upper and lower extremities Musculoskeletal: Full range of motion in all 4 extremities. No joint swelling   Assessment/Plan: 1. Functional deficits which require 3+ hours per day of interdisciplinary therapy in a comprehensive inpatient rehab setting. Physiatrist is providing close team supervision and 24 hour management of active medical problems listed below. Physiatrist and rehab team continue to assess barriers to discharge/monitor patient progress toward functional and medical goals  Care Tool:  Bathing    Body parts bathed by patient: Right arm, Left arm, Abdomen, Chest, Front perineal area, Buttocks, Right upper leg, Left upper leg, Right lower leg, Left lower leg, Face         Bathing assist Assist Level: Contact Guard/Touching assist     Upper Body Dressing/Undressing Upper body dressing   What is the patient wearing?: Pull over shirt    Upper body assist Assist Level: Set up assist    Lower Body Dressing/Undressing Lower body dressing      What is  the patient wearing?: Incontinence brief, Pants     Lower body assist Assist for lower body dressing: Minimal Assistance - Patient > 75%     Toileting Toileting    Toileting assist Assist for toileting: Independent with assistive device Assistive Device Comment:  (walker)   Transfers Chair/bed transfer  Transfers assist     Chair/bed transfer assist level: Minimal Assistance - Patient > 75%     Locomotion Ambulation   Ambulation assist      Assist level: Contact Guard/Touching assist Assistive device: Walker-rolling Max distance: 60   Walk 10 feet activity   Assist     Assist level: Contact Guard/Touching assist Assistive device: Walker-rolling   Walk 50 feet activity   Assist    Assist level: Contact Guard/Touching assist Assistive device: Walker-rolling    Walk 150 feet activity   Assist Walk 150 feet  activity did not occur: Safety/medical concerns (2/2 fatigue)         Walk 10 feet on uneven surface  activity   Assist     Assist level: Minimal Assistance - Patient > 75% Assistive device: Walker-rolling   Wheelchair     Assist Is the patient using a wheelchair?: No   Wheelchair activity did not occur: N/A         Wheelchair 50 feet with 2 turns activity    Assist    Wheelchair 50 feet with 2 turns activity did not occur: N/A       Wheelchair 150 feet activity     Assist  Wheelchair 150 feet activity did not occur: N/A       Blood pressure 136/63, pulse 63, temperature 97.7 F (36.5 C), temperature source Oral, resp. rate 18, height 5\' 9"  (1.753 m), weight 103.7 kg, SpO2 95 %.  Medical Problem List and Plan: 1. Functional deficits secondary to right MCA territory infarcts on 08/08/2022 and left subcortical lacunar infarct.  Recommendations for 30-day cardiac event monitor             -patient may shower             -ELOS/Goals: 10-14 days, SPV PT/OT/SLP  2.  Antithrombotics: -DVT/anticoagulation:  Pharmaceutical: Lovenox             -antiplatelet therapy: Aspirin 81 mg daily and Plavix 75 mg daily x 3 weeks then aspirin alone 3. Pain Management: Oxycodone as needed 4. Mood/Behavior/Sleep: Lexapro 20 mg daily, Wellbutrin 300 mg daily             -antipsychotic agents: N/A 5. Neuropsych/cognition: This patient is capable of making decisions on her own behalf. 6. Skin/Wound Care: Skin care as advised to left foot wound.  Routine skin checks..  Follow-up outpatient podiatry             - Mild sacral wound, small tear with minimal bleeding; cover and ensure no soiling - WOCN saw 5/26   7. Fluids/Electrolytes/Nutrition: Routine in and outs with follow-up chemistries 8.  E. coli UTI.  Intravenous Rocephin 08/09/2022 9.  Hypertension.  Cozaar 50 mg daily, Coreg 6.25 mg twice daily.  Monitor with increased mobility  - 5/27: BP mildly elevated, monitor  with activity this week and consider increase in Cozaar  Vitals:   08/14/22 1916 08/15/22 0328  BP: (!) 157/49 136/63  Pulse: 67 63  Resp: 18 18  Temp: 98.4 F (36.9 C) 97.7 F (36.5 C)  SpO2: 99% 95%    10.  Hyperlipidemia.  Lipitor 11.  Diabetes mellitus with peripheral  neuropathy.  Hemoglobin A1c 7.0.  Currently on SSI and Semglee 15 units nightly.  Diabetic teaching.    - 5/27: Poorly controlled, increase Semglee to 18 U Recent Labs    08/14/22 1626 08/14/22 2214 08/15/22 0555  GLUCAP 201* 178* 160*       12.  Obesity.  BMI 34.21.  Dietary follow-up 13.  AKI on CKD stage III.  Most recent creatinine 1.42 earlier this month.  Follow-up chemistries  - 5/27: BUN/Cr uptrending to 37/1.64 this AM; encourage fluids, repeat in 2 days  14.  History of ductal carcinoma in situ of left breast.  Status postlumpectomy radioactive seed 2021.  Will discuss resuming Nolvadex.  Follow-up Dr. Pamelia Hoit 15.Right eighth rib fracture from fall.Conservative care  - Adding lidocaine patches to R chest  16.  Acute anemia.  Follow-up CBC.   17. Edema. TEDs (can cut out toes for wounds) and elevate legs in bed.  Given creatinine at baseline, consider addition of diuretic for edema if worsening. - hold off until Cr stabilizes  - weights downtrending, swelling appears stable Filed Weights   08/12/22 1553 08/15/22 0500  Weight: 108.3 kg 103.7 kg         LOS: 3 days A FACE TO FACE EVALUATION WAS PERFORMED  [name] 08/15/2022, 8:22 AM

## 2022-08-15 NOTE — Discharge Instructions (Addendum)
Inpatient Rehab Discharge Instructions  [name] Discharge date and time: No discharge date for patient encounter.   Activities/Precautions/ Functional Status: Activity: activity as tolerated Diet: Diabetic diet Wound Care: Bactroban left foot wound daily.  Cover with foam dressing.  Change foam dressing every 3 days as needed  Wound care to sacrum.  Cleanse daily and as needed using house skin cleanser.  Pat dry.  Cover any open area with Xeroform gauze, top with silicone foam dressing.  Change Xeroform daily  Bilateral heel wounds.  Mepilex dressing change daily or as needed if soaked.  Offload with Prevalon boot in bed   Functional status:  ___ No restrictions     ___ Walk up steps independently ___ 24/7 supervision/assistance   ___ Walk up steps with assistance ___ Intermittent supervision/assistance  ___ Bathe/dress independently ___ Walk with walker     _x__ Bathe/dress with assistance ___ Walk Independently    ___ Shower independently ___ Walk with assistance    ___ Shower with assistance ___ No alcohol     ___ Return to work/school ________  Special Instructions:  No driving smoking or alcohol     COMMUNITY REFERRALS UPON DISCHARGE:    Outpatient: PT     OT             Agency: [facility]     Phone: [phone]             Appointment Date/Time: *Please expect follow-up within 7-10 business days to schedule your home visit. If you have not received follow-up, be sure to contact the site directly.*  Medical Equipment/Items Ordered:                                                 Agency/Supplier:       Continue aspirin 81 mg daily and Plavix 75 mg daily until 08/30/2022 then aspirin aloneSTROKE/TIA DISCHARGE INSTRUCTIONS SMOKING Cigarette smoking nearly doubles your risk of having a stroke & is the single most alterable risk factor  If you smoke or have smoked in the last 12 months, you are advised to quit smoking for your health. Most of the excess  cardiovascular risk related to smoking disappears within a year of stopping. Ask you doctor about anti-smoking medications [place] Quit Line: 1-800-QUIT NOW Free Smoking Cessation Classes [phone]  CHOLESTEROL Know your levels; limit fat & cholesterol in your diet  Lipid Panel     Component Value Date/Time   CHOL 121 08/09/2022 0328   TRIG 144 08/09/2022 0328   HDL 34 (L) 08/09/2022 0328   CHOLHDL 3.6 08/09/2022 0328   VLDL 29 08/09/2022 0328   LDLCALC 58 08/09/2022 0328     Many patients benefit from treatment even if their cholesterol is at goal. Goal: Total Cholesterol (CHOL) less than 160 Goal:  Triglycerides (TRIG) less than 150 Goal:  HDL greater than 40 Goal:  LDL (LDLCALC) less than 100   BLOOD PRESSURE American Stroke Association blood pressure target is less that 120/80 mm/Hg  Your discharge blood pressure is:  BP: 136/63 Monitor your blood pressure Limit your salt and alcohol intake Many individuals will require more than one medication for high blood pressure  DIABETES (A1c is a blood sugar average for last 3 months) Goal HGBA1c is under 7% (HBGA1c is blood sugar average for last 3 months)  Diabetes:  Lab Results  Component Value Date   HGBA1C 7.0 (H) 08/09/2022    Your HGBA1c can be lowered with medications, healthy diet, and exercise. Check your blood sugar as directed by your physician Call your physician if you experience unexplained or low blood sugars.  PHYSICAL ACTIVITY/REHABILITATION Goal is 30 minutes at least 4 days per week  Activity: Increase activity slowly, Therapies: Physical Therapy: Home Health Return to work:  Activity decreases your risk of heart attack and stroke and makes your heart stronger.  It helps control your weight and blood pressure; helps you relax and can improve your mood. Participate in a regular exercise program. Talk with your doctor about the best form of exercise for you (dancing, walking, swimming, cycling).  DIET/WEIGHT  Goal is to maintain a healthy weight  Your discharge diet is:  Diet Order             Diet Carb Modified Fluid consistency: Thin; Room service appropriate? Yes  Diet effective now                   liquids Your height is:  Height: 5\' 9"  (175.3 cm) Your current weight is: Weight: 108.3 kg Your Body Mass Index (BMI) is:  BMI (Calculated): 35.24 Following the type of diet specifically designed for you will help prevent another stroke. Your goal weight range is:   Your goal Body Mass Index (BMI) is 19-24. Healthy food habits can help reduce 3 risk factors for stroke:  High cholesterol, hypertension, and excess weight.  RESOURCES Stroke/Support Group:  Call [phone]   STROKE EDUCATION PROVIDED/REVIEWED AND GIVEN TO PATIENT Stroke warning signs and symptoms How to activate emergency medical system (call 911). Medications prescribed at discharge. Need for follow-up after discharge. Personal risk factors for stroke. Pneumonia vaccine given: No Flu vaccine given: No My questions have been answered, the writing is legible, and I understand these instructions.  I will adhere to these goals & educational materials that have been provided to me after my discharge from the hospital.     My questions have been answered and I understand these instructions. I will adhere to these goals and the provided educational materials after my discharge from the hospital.  Patient/Caregiver Signature _______________________________ Date __________  Clinician Signature _______________________________________ Date __________  Please bring this form and your medication list with you to all your follow-up doctor's appointments.

## 2022-08-15 NOTE — IPOC Note (Signed)
Overall Plan of Care [facility]) Patient Details Name: [name] MRN: [id number] DOB: 1950-07-11  Admitting Diagnosis: Right middle cerebral artery stroke [name])  Hospital Problems: Principal Problem:   Right middle cerebral artery stroke [facility])     Functional Problem List: Nursing Safety, Bladder, Bowel, Skin Integrity, Edema, Endurance, Medication Management, Pain  PT Balance, Edema, Endurance, Motor, Pain, Safety, Skin Integrity  OT Balance, Behavior, Edema, Endurance, Pain, Motor, Skin Integrity, Sensory, Safety  SLP    TR         Basic ADL's: OT Eating, Grooming, Bathing, Toileting, Dressing     Advanced  ADL's: OT Simple Meal Preparation     Transfers: PT Bed to Chair, Bed Mobility, Car  OT Toilet, Tub/Shower     Locomotion: PT Ambulation, Stairs     Additional Impairments: OT None  SLP        TR      Anticipated Outcomes Item Anticipated Outcome  Self Feeding Independent  Swallowing      Basic self-care  Mod Independent  Toileting  Mod Independent   Bathroom Transfers Mod Independent  Bowel/Bladder  regain continence of bowel and bladder  Transfers  mod I  Locomotion  mod I + LRAD for amb and S + LRAD for stairs  Communication     Cognition     Pain  less than 4  Safety/Judgment  fall free   Therapy Plan: PT Intensity: Minimum of 1-2 x/day ,45 to 90 minutes PT Frequency: 5 out of 7 days PT Duration Estimated Length of Stay: 10-12 days OT Intensity: Minimum of 1-2 x/day, 45 to 90 minutes OT Frequency: 5 out of 7 days OT Duration/Estimated Length of Stay: 10 days     Team Interventions: Nursing Interventions Patient/Family Education, Medication Management, Bladder Management, Skin Care/Wound Management, Bowel Management, Disease Management/Prevention, Pain Management, Discharge Planning  PT interventions Ambulation/gait training, Balance/vestibular training, Cognitive remediation/compensation, Discharge planning, Disease  management/prevention, DME/adaptive equipment instruction, Functional mobility training, Neuromuscular re-education, Pain management, Patient/family education, Skin care/wound management, Stair training, Therapeutic Activities, Therapeutic Exercise, UE/LE Strength taining/ROM, UE/LE Coordination activities  OT Interventions Balance/vestibular training, Discharge planning, Pain management, Self Care/advanced ADL retraining, Therapeutic Activities, UE/LE Coordination activities, Cognitive remediation/compensation, Disease mangement/prevention, Functional mobility training, Patient/family education, Skin care/wound managment, Therapeutic Exercise, DME/adaptive equipment instruction, UE/LE Strength taining/ROM  SLP Interventions    TR Interventions    SW/CM Interventions Discharge Planning, Psychosocial Support, Patient/Family Education   Barriers to Discharge MD  Medical stability, Home enviroment access/loayout, Incontinence, Lack of/limited family support, Insurance for SNF coverage, and Medication compliance  Nursing Decreased caregiver support, Home environment access/layout, Incontinence, Wound Care, Lack of/limited family support, Insurance for SNF coverage, Weight, Medication compliance 2 level B/B main level with 5 steps rail on right/left  PT Wound Care, Lack of/limited family support    OT      SLP      SW Decreased caregiver support, Lack of/limited family support, Community education officer for SNF coverage     Team Discharge Planning: Destination: PT-Home ,OT- Home , SLP-  Projected Follow-up: PT-Home health PT, OT-  Outpatient OT, SLP-  Projected Equipment Needs: PT-To be determined, OT- To be determined, SLP-  Equipment Details: PT-may need WC for community, TBD, OT-  Patient/family involved in discharge planning: PT- Patient,  OT-Patient, SLP-   MD ELOS: 10-14 days Medical Rehab Prognosis:  Good Assessment: The patient has been admitted for CIR therapies with the diagnosis of R MCA stroke.  The team will be addressing functional mobility, strength,  stamina, balance, safety, adaptive techniques and equipment, self-care, bowel and bladder mgt, patient and caregiver education. Goals have been set at supervision. Anticipated discharge destination is home.       See Team Conference Notes for weekly updates to the plan of care

## 2022-08-15 NOTE — Care Management (Signed)
Inpatient Rehabilitation Center Individual Statement of Services  Patient Name:  [name]  Date:  08/15/2022  Welcome to the Inpatient Rehabilitation Center.  Our goal is to provide you with an individualized program based on your diagnosis and situation, designed to meet your specific needs.  With this comprehensive rehabilitation program, you will be expected to participate in at least 3 hours of rehabilitation therapies Monday-Friday, with modified therapy programming on the weekends.  Your rehabilitation program will include the following services:  Physical Therapy (PT), Occupational Therapy (OT), 24 hour per day rehabilitation nursing, Therapeutic Recreaction (TR), Psychology, Neuropsychology, Care Coordinator, Rehabilitation Medicine, Nutrition Services, Pharmacy Services, and Other  Weekly team conferences will be held on Tuesdays to discuss your progress.  Your Inpatient Rehabilitation Care Coordinator will talk with you frequently to get your input and to update you on team discussions.  Team conferences with you and your family in attendance may also be held.  Expected length of stay: 10-12 days    Overall anticipated outcome: Independent with Assistive Device  Depending on your progress and recovery, your program may change. Your Inpatient Rehabilitation Care Coordinator will coordinate services and will keep you informed of any changes. Your Inpatient Rehabilitation Care Coordinator's name and contact numbers are listed  below.  The following services may also be recommended but are not provided by the Inpatient Rehabilitation Center:  Driving Evaluations Home Health Rehabiltiation Services Outpatient Rehabilitation Services Vocational Rehabilitation   Arrangements will be made to provide these services after discharge if needed.  Arrangements include referral to agencies that provide these services.  Your insurance has been verified to be:  [employer]  Your  primary doctor is:  Laurann Montana  Pertinent information will be shared with your doctor and your insurance company.  Inpatient Rehabilitation Care Coordinator:  Susie Cassette [phone] or ([phone]  Information discussed with and copy given to patient by: Gretchen Short, 08/15/2022, 10:06 AM

## 2022-08-15 NOTE — Progress Notes (Signed)
Inpatient Rehabilitation Care Coordinator Assessment and Plan Patient Details  Name: [name] MRN: [id number] Date of Birth: [birth year]  Today's Date: 08/15/2022  Hospital Problems: Principal Problem:   Right middle cerebral artery stroke [facility])  Past Medical History:  Past Medical History:  Diagnosis Date   Breast cancer (HCC) 12/2019   left breast DCIS   CKD (chronic kidney disease) 06/07/2016   Stage 2, GFR 60-89 ml/min   Diabetic polyneuropathy 06/07/2016   Dyslipidemia    Endometrial adenocarcinoma 2012   Fever blister    Hyperlipidemia    Hypertension, essential 06/07/2016   Major depressive disorder    Mixed dyslipidemia 06/07/2016   MRSA infection 10/2019   left foot wound   Obstructive sleep apnea 06/07/2016   does not use CPAP   Peripheral neuropathy    Retinopathy    Severe obesity    Skin ulcer of left foot with fat layer exposed 11/02/2016   Stroke    Asymptomatic, discovered via neuroimaging; small infarct in left parietal lobe; also concern for small b/l frontal infarcts   Tachycardia 07/19/2017   Type 2 diabetes mellitus with hyperglycemia, with long-term current use of insulin 06/07/2016   Unilateral primary osteoarthritis, right knee 04/16/2019   Past Surgical History:  Past Surgical History:  Procedure Laterality Date   ADRENALECTOMY     BREAST LUMPECTOMY WITH RADIOACTIVE SEED LOCALIZATION Left 02/25/2020   Procedure: LEFT BREAST LUMPECTOMY WITH RADIOACTIVE SEED LOCALIZATION;  Surgeon: Abigail Miyamoto, MD;  Location: [facility];  Service: General;  Laterality: Left;   BUNIONECTOMY     CARDIAC CATHETERIZATION     CERVICAL SPINE SURGERY     CHOLECYSTECTOMY     DILATION AND CURETTAGE OF UTERUS     LAPAROSCOPIC SALPINGOOPHERECTOMY     THORACOTOMY     TONSILLECTOMY     Social History:  reports that she has never smoked. She has never used smokeless tobacco. She reports that she does not drink alcohol and does not use drugs.  Family /  Support Systems Marital Status: Single Spouse/Significant Other: N/A Children: No Children Other Supports: Sister [name] and [name] (lives in [place], [place]), and brother [name] Anticipated Caregiver: Sister [name] Ability/Limitations of Caregiver: None reported; pt will d/c to her sister [name]'s home Caregiver Availability: 24/7 Family Dynamics: Pt was living home alone PTA.  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 Education: college Dentist - How often do you need to have someone help you when you read instructions, pamphlets, or other written material from your doctor or pharmacy?: Never Writes: Yes Employment Status: Retired Date Retired/Disabled/Unemployed: 2018 Age Retired: 65 Marine scientist Issues: Denies Guardian/Conservator: HCPOA- sister [name] (no documentation on file).   Abuse/Neglect Abuse/Neglect Assessment Can Be Completed: Yes Physical Abuse: Denies Verbal Abuse: Denies Sexual Abuse: Denies Exploitation of patient/patient's resources: Denies Self-Neglect: Denies  Patient response to: Social Isolation - How often do you feel lonely or isolated from those around you?: Rarely  Emotional Status Pt's affect, behavior and adjustment status: Pt in good spirits, and tearful at times. Pt adjusting as well as to be expected. Recent Psychosocial Issues: Adjusting to the physical limitation the stroke has had on her. Psychiatric History: Pt admits to hx of depression adn medication prescribed by PCP. admits to counseling the past but did not continue once the therapist relocated. Substance  Abuse History: Denies  Patient / Family Perceptions, Expectations & Goals Pt/Family understanding of illness & functional limitations: Pt and family have a general understanding of pt  care needs Premorbid pt/family roles/activities: Independent; assistance with grocery shopping as she reports she woudl fatigue quickly. Anticipated changes in roles/activities/participation: Assistance with ADLs/IADLs Pt/family expectations/goals: Pt goal is to work on getting out of my comfort zone and new experiences.  Community Resources [employer]: None Premorbid Home Care/DME Agencies: None Transportation available at discharge: Sister [name] Is the patient able to respond to transportation needs?: Yes In the past 12 months, has lack of transportation kept you from medical appointments or from getting medications?: No In the past 12 months, has lack of transportation kept you from meetings, work, or from getting things needed for daily living?: No Resource referrals recommended: Neuropsychology  Discharge Planning Living Arrangements: Other relatives Support Systems: Other relatives Type of Residence: Private residence Insurance Resources: Media planner (specify) ([employer]) Financial Resources: Tree surgeon, Other (Comment) (pension) Financial Screen Referred: No Living Expenses: Own Money Management: Patient Does the patient have any problems obtaining your medications?: No Home Management: Pt managed all homecare needs; only prepared light meals. Patient/Family Preliminary Plans: TBD Care Coordinator Barriers to Discharge: Decreased caregiver support, Lack of/limited family support, Insurance for SNF coverage Care Coordinator Anticipated Follow Up Needs: HH/OP Expected length of stay: 10-12 days  Clinical Impression SW met with pt in room to introduce self, explain role, and discuss discharge process. Pt is not a Cytogeneticist. No HCPOA. DME: care, and RW.   Preciliano Castell A Corinthian Kemler 08/15/2022, 1:44 PM

## 2022-08-16 DIAGNOSIS — I63511 Cerebral infarction due to unspecified occlusion or stenosis of right middle cerebral artery: Secondary | ICD-10-CM | POA: Diagnosis not present

## 2022-08-16 LAB — GLUCOSE, CAPILLARY
Glucose-Capillary: 186 mg/dL — ABNORMAL HIGH (ref 70–99)
Glucose-Capillary: 174 mg/dL — ABNORMAL HIGH (ref 70–99)
Glucose-Capillary: 108 mg/dL — ABNORMAL HIGH (ref 70–99)
Glucose-Capillary: 183 mg/dL — ABNORMAL HIGH (ref 70–99)

## 2022-08-16 MED ORDER — INSULIN GLARGINE-YFGN 100 UNIT/ML ~~LOC~~ SOLN
22.0000 [IU] | Freq: Every day | SUBCUTANEOUS | Status: DC
  Administered 2022-08-16 – 2022-08-24 (×9): 22 [IU] via SUBCUTANEOUS
  Filled 2022-08-16 (×10): qty 0.22

## 2022-08-16 NOTE — Progress Notes (Addendum)
[name], female   DOB: 04-05-1950, 72 y.o.   MRN: [id number]  1315- SW spoke with [name] to provide updates from team conference, d/c date is 6/6, and outpatient therapies- PT/OT. SW informed there will continue to be wound care needed. She will transport to outpatient therapies; preferred location is [facility] for PT/OT. Confirmed family edu on Friday 8am-12pm.   *SW met with pt in room to discuss above. She is aware there will be confirmation on DME needed at discharge.   Cecile Sheerer, MSW, LCSWA Office: [phone] Cell: [phone] Fax: [phone]

## 2022-08-16 NOTE — Progress Notes (Signed)
PROGRESS NOTE   Subjective/Complaints:  No events overnight,   Complaining of stress incontinence, new this hospitalization.  Denies any dysuria, urgency, difficulty feeling the need to urinate, or difficulty starting or stopping urinary stream and volitional.  Only occurs during positional changes.  Has never been on medication for stress incontinence.  Otherwise, doing well.  ROS:  +stress incontinence - new, ongoing Denies fevers, chills, N/V, abdominal pain, constipation, diarrhea, SOB, cough, chest pain, new weakness or paraesthesias.    Objective:   No results found. Recent Labs    08/15/22 0542  WBC 5.2  HGB 9.0*  HCT 27.7*  PLT 254    Recent Labs    08/15/22 0542  NA 138  K 3.8  CL 104  CO2 24  GLUCOSE 165*  BUN 37*  CREATININE 1.64*  CALCIUM 8.3*     Intake/Output Summary (Last 24 hours) at 08/16/2022 0904 Last data filed at 08/16/2022 1610 [place] per 24 hour  Intake 168 ml  Output --  Net 168 ml         Physical Exam: Vital Signs Blood pressure 130/62, pulse (!) 58, temperature 98.4 F (36.9 C), temperature source Oral, resp. rate 16, height 5\' 9"  (1.753 m), weight 105.5 kg, SpO2 100 %.   General: No acute distress.  Sitting with legs elevated and bedside with chair. +obese Heart: Regular rate and rhythm no rubs murmurs or extra sounds Lungs: Clear to auscultation, breathing unlabored, no rales or wheezes Abdomen: Positive bowel sounds, soft nontender to palpation, nondistended Extremities: No clubbing, cyanosis. + BL LE edema 3+ -unchanged Skin: + R MTP wound - chronic, stable, covered in mepilex + Buttocks skin tear - stable Neurologic: AAOx4. Appropriate mood and affect. Cranial nerves II through XII intact,  motor strength is 5/5 BL UE , Bilateral lower extremities 3 out of 5 proximally, 4 out of 5 distally  Sensory exam reduced sensation to light touch  lower extremities to the  knees-unchanged Cerebellar exam normal finger to nose to finger as well as heel to shin in bilateral upper and lower extremities Musculoskeletal: Full range of motion in all 4 extremities. No joint swelling   Assessment/Plan: 1. Functional deficits which require 3+ hours per day of interdisciplinary therapy in a comprehensive inpatient rehab setting. Physiatrist is providing close team supervision and 24 hour management of active medical problems listed below. Physiatrist and rehab team continue to assess barriers to discharge/monitor patient progress toward functional and medical goals  Care Tool:  Bathing    Body parts bathed by patient: Right arm, Left arm, Abdomen, Chest, Front perineal area, Buttocks, Right upper leg, Left upper leg, Right lower leg, Left lower leg, Face         Bathing assist Assist Level: Contact Guard/Touching assist     Upper Body Dressing/Undressing Upper body dressing   What is the patient wearing?: Pull over shirt    Upper body assist Assist Level: Set up assist    Lower Body Dressing/Undressing Lower body dressing      What is the patient wearing?: Incontinence brief, Pants     Lower body assist Assist for lower body dressing: Minimal Assistance - Patient > 75%  Toileting Toileting    Toileting assist Assist for toileting: Independent with assistive device Assistive Device Comment:  (walker)   Transfers Chair/bed transfer  Transfers assist     Chair/bed transfer assist level: Minimal Assistance - Patient > 75%     Locomotion Ambulation   Ambulation assist      Assist level: Contact Guard/Touching assist Assistive device: Walker-rolling Max distance: 60   Walk 10 feet activity   Assist     Assist level: Contact Guard/Touching assist Assistive device: Walker-rolling   Walk 50 feet activity   Assist    Assist level: Contact Guard/Touching assist Assistive device: Walker-rolling    Walk 150 feet  activity   Assist Walk 150 feet activity did not occur: Safety/medical concerns (2/2 fatigue)         Walk 10 feet on uneven surface  activity   Assist     Assist level: Minimal Assistance - Patient > 75% Assistive device: Walker-rolling   Wheelchair     Assist Is the patient using a wheelchair?: No             Wheelchair 50 feet with 2 turns activity    Assist            Wheelchair 150 feet activity     Assist          Blood pressure 130/62, pulse (!) 58, temperature 98.4 F (36.9 C), temperature source Oral, resp. rate 16, height 5\' 9"  (1.753 m), weight 105.5 kg, SpO2 100 %.  Medical Problem List and Plan: 1. Functional deficits secondary to right MCA territory infarcts on 08/08/2022 and left subcortical lacunar infarct.  Recommendations for 30-day cardiac event monitor             -patient may shower             -ELOS/Goals: 10-14 days, SPV PT/OT/SLP; DC date 6/6  - rec therapy consult per pt request 5/28   - 5/28: CGA OT currently, PT states very debilitated, very poor standing tolerance. She is incontinent when standing but resists wearing brief; SPV goals.    - Family Ed monday  2.  Antithrombotics: -DVT/anticoagulation:  Pharmaceutical: Lovenox             -antiplatelet therapy: Aspirin 81 mg daily and Plavix 75 mg daily x 3 weeks then aspirin alone 3. Pain Management: Oxycodone as needed 4. Mood/Behavior/Sleep: Lexapro 20 mg daily, Wellbutrin 300 mg daily             -antipsychotic agents: N/A 5. Neuropsych/cognition: This patient is capable of making decisions on her own behalf. 6. Skin/Wound Care: Skin care as advised to left foot wound.  Routine skin checks..  Follow-up outpatient podiatry             - Mild sacral wound, small tear with minimal bleeding; cover and ensure no soiling - WOCN saw 5/26   7. Fluids/Electrolytes/Nutrition: Routine in and outs with follow-up chemistries 8.  E. coli UTI.  Intravenous Rocephin 08/09/2022 9.   Hypertension.  Cozaar 50 mg daily, Coreg 6.25 mg twice daily.  Monitor with increased mobility  - 5/27: BP mildly elevated, monitor with activity this week and consider increase in Cozaar  - 5/28: BP improved this AM; monitor  Vitals:   08/15/22 2119 08/16/22 0450  BP: (!) 160/54 130/62  Pulse: 62 (!) 58  Resp: 16 16  Temp: 98.7 F (37.1 C) 98.4 F (36.9 C)  SpO2: 100% 100%    10.  Hyperlipidemia.  Lipitor 11.  Diabetes mellitus with peripheral neuropathy.  Hemoglobin A1c 7.0.  Currently on SSI and Semglee 15 units nightly.  Diabetic teaching.    - 5/27: Poorly controlled, increase Semglee to 18 U  - 5/28: Increase Semglee to 22 U Recent Labs    08/15/22 1710 08/15/22 2220 08/16/22 0602  GLUCAP 170* 231* 186*        12.  Obesity.  BMI 34.21.  Dietary follow-up 13.  AKI on CKD stage III.  Most recent creatinine 1.42 earlier this month.  Follow-up chemistries  - 5/27: BUN/Cr uptrending to 37/1.64 this AM; encourage fluids, repeat in 2 days  14.  History of ductal carcinoma in situ of left breast.  Status postlumpectomy radioactive seed 2021.  Will discuss resuming Nolvadex.  Follow-up Dr. Pamelia Hoit 15.Right eighth rib fracture from fall.Conservative care  - Adding lidocaine patches to R chest  16.  Acute anemia.  Follow-up CBC.   17. Edema. TEDs (can cut out toes for wounds) and elevate legs in bed.  Given creatinine at baseline, consider addition of diuretic for edema if worsening. - hold off until Cr stabilizes  - weights stable, downtrending.  Filed Weights   08/12/22 1553 08/15/22 0500 08/16/22 0600  Weight: 108.3 kg 103.7 kg 105.5 kg   18.  Urge/Stress incontinence. PVRS x3 days. Encourage double voiding. Cannot use 5-alpha reductase inhibitors d/t sulfa allergy. Will hold off and monitor with above measures, consider Ditropan.     LOS: 4 days A FACE TO FACE EVALUATION WAS PERFORMED  [name] 08/16/2022, 9:04 AM

## 2022-08-16 NOTE — Progress Notes (Signed)
Physical Therapy Session Note  Patient Details  Name: [name] MRN: [id number] Date of Birth: 12/28/1950  Today's Date: 08/16/2022 PT Individual Time: 1100-1125 + 7829-5621 PT Individual Time Calculation (min): 25 min + 70 min  Short Term Goals: Week 1:  PT Short Term Goal 1 (Week 1): Pt will complete bed mobility with modI PT Short Term Goal 2 (Week 1): Pt will completed bed <> chair transfer with S + LRAD PT Short Term Goal 3 (Week 1): Pt will amb 198ft with S + LRAD PT Short Term Goal 4 (Week 1): Pt will navigate 5 steps with CGA + unilateral rail  Skilled Therapeutic Interventions/Progress Updates:    Session 1: Chart reviewed and pt agreeable to therapy. Pt received seated in recliner with no c/o pain. Session focused on amb endurance and safety and balance to prote safe home access. Pt initiated session with amb of 2107ft using S + RW. Of note, pt displayed increased awareness of shuffling and ability to correct/avoid narrow stance, though pt still required min VC when fatigued. Pt also demonstrated slight forward lean when fatigued and challenge with avoiding contact between L foot and RW on L turns, so pt educated on RW safety with turns and fatigue.  Pt then completed series of balance exercises including neutral stance + word naming in neutral stance with S, and narrow stance and staggered stance + word naming with CGA. Session education emphasized continued balance training across lifespan. At end of session, pt was left seated in recliner with alarm engaged, nurse call bell and all needs in reach.   Session 2: Chart reviewed and pt agreeable to therapy. Pt received seated in recliner with no c/o pain. Session focused on amb endurance and quality, stair navigation, and activity tolerance to promote safe home access. Pt initiated session with amb of 286ft to therapy gym using S + RW except for L turns when pt required CGA + RW + strong VC to avoid contact between RW and L foot. Pt  gait also noted to display shuffling and increased forward flexion with fatigue that was mitigated with VC. Pt then completed 2x8 steps at 6" height with CGA + B rails, indicated increased endurance for stair navigation compared to prior sessions. Pt then amb to second therapy gym with same assist as first amb. Pt then completed 12 mins interval training on NuStep at low-medium workloads 1-4 to promote activity tolerance in setting of [facility] boot. Pt then amb 260ft to return to room with same assist but increased VC for upright posture 2/2 fatigue. Pt noted to have increased R RW deviation with fatigue, which was mitigated with continuous VC. In room, pt completed reaches to ground (6" and 2" from ground) for 2x6 with CGA + RW indicated increased dynamic balance, although pt still requires support for safety. Pt then completed 2x6 seated marches with instructions to practice between sessions to promote L hip fl strength. At end of session, pt was left seated in recliner with alarm engaged, nurse call bell and all needs in reach.    Therapy Documentation Precautions:  Precautions Precautions: Fall Required Braces or Orthoses: Other Brace Other Brace: Darco Left  - chronic wound Restrictions Weight Bearing Restrictions: No     Therapy/Group: Individual Therapy  Dionne Milo, PT, DPT 08/16/2022, 12:12 PM

## 2022-08-16 NOTE — Progress Notes (Signed)
IV dressing changed today. IV flushed and patient. Patient tolerated well.    Tilden Dome, LPN

## 2022-08-16 NOTE — Progress Notes (Signed)
Physical Therapy Session Note  Patient Details  Name: [name] MRN: [id number] Date of Birth: 12-Dec-1950  Today's Date: 08/16/2022 PT Individual Time: 0800-0900 PT Individual Time Calculation (min): 60 min   Short Term Goals: Week 1:  PT Short Term Goal 1 (Week 1): Pt will complete bed mobility with modI PT Short Term Goal 2 (Week 1): Pt will completed bed <> chair transfer with S + LRAD PT Short Term Goal 3 (Week 1): Pt will amb 164ft with S + LRAD PT Short Term Goal 4 (Week 1): Pt will navigate 5 steps with CGA + unilateral rail  Skilled Therapeutic Interventions/Progress Updates:  Patient greeted supine in bed and agreeable to PT treatment session. Patient transitioned to sitting EOB with supv- While sitting EOB, patient was able to doff gown and don shirt, underwear, pants and socks with set-up assistance and a significant amount of time required to complete secondary to fatigue. Patient stood various times from EOB with use of RW and CGA for safety- Minor VC for proper hand placement with inconsistent carryover noted. Patient remained standing for clothing management, as well as nursing to look at wound on bottom and change dressing. Patient ambulated to the sink with CGA and use of RW where she initiated brushing her teeth, however reported she needed to urgently use the restroom. Patient then ambulated into the bathroom with CGA and use of RW- VC for stepping within the frame of the walker as patient had her L foot outside of it. Patient was able to manage pants/underwear and noted to be incontinent through her underwear and pants. While sitting on the commode over toilet, patient was able to doff pants/underwear and shoes. Patient performed front and back pericare with CGA for safety and VC for improved postural extension and she tends to sink into flexion with increased fatigue. Therapist provided assistance for back pericare in order to ensure cleanliness. Patient required a seated  rest break on the commode prior to ambulating to her bedside recliner with RW and CGA. Patient left sitting in bedside recliner with posey belt on, call bell within reach and all needs met.    Therapy Documentation Precautions:  Precautions Precautions: Fall Required Braces or Orthoses: Other Brace Other Brace: Darco Left  - chronic wound Restrictions Weight Bearing Restrictions: No   Pain: No/Denies pain.    Therapy/Group: Individual Therapy  Deepika Decatur 08/16/2022, 7:50 AM

## 2022-08-16 NOTE — Patient Care Conference (Signed)
Inpatient RehabilitationTeam Conference and Plan of Care Update Date: 08/16/2022   Time: 10:29 AM   Patient Name: [name]      Medical Record Number: [id number]  Date of Birth: 16-Feb-1951 Sex: Female         Room/Bed: 4M10C/4M10C-01 Payor Info: Payor: Arna [employer]i ADVANTAGE / Plan: [employer] PPO / Product Type: *No Product type* /    Admit Date/Time:  08/12/2022  3:55 PM  Primary Diagnosis:  Right middle cerebral artery stroke [facility])  Hospital Problems: Principal Problem:   Right middle cerebral artery stroke [facility])    Expected Discharge Date: Expected Discharge Date: 08/25/22  Team Members Present: Physician leading conference: Dr. Elijah Birk Social Worker Present: Cecile Sheerer, LCSWA Nurse Present: Vedia Pereyra, RN PT Present: Amedeo Plenty, PT OT Present: Kearney Hard, OT PPS Coordinator present : Fae Pippin, SLP     Current Status/Progress Goal Weekly Team Focus  Bowel/Bladder   Patient is continent with occasional incontinent episodes of bladder, LBM   Maintain and restore continence   Assess and address toileting needs, Time toleting with patinent    Swallow/Nutrition/ Hydration               ADL's   min/CGA A for BADL Tasks, fatigues very quickly, poor activty tolerancce   Mod I, may downgrade to supervision   endurance, activity tolerance, dc planning, pt.family education, self-care retraining    Mobility   Supv for bed mobility; CGA/MinA for sit/stands; CGA for stand pivot transfers; CGA/MinA for gait >200'; MinA for stair mobility- Limited by global deconditioning/generalized weakness   ModI/Supv with LRAD  Endurance/activity tolerance, global strengthening, dynamic stability, gait, stair mobility, discharge planning, family education, etc.    Communication                Safety/Cognition/ Behavioral Observations               Pain   Denies pain but has discomfort ti right rib cage when repositionig    pain/discomfort <- to 2/10   Assess QS and prn pain and discomfort    Skin    MASD to buttocks with wound to left foot.     Manage skin and wound care   Assess every shift and PRN      Discharge Planning:  D/c to her sister [name]'s home who will provide 24/7care . SW will confirm there are no barriers to discharge. Fam edu on Friday (5/31) 8am-12pm.   Team Discussion: Right MCA/CVA. Continent/incontinent with B/B. Refuses to wear briefs. Rib cage pain managed with PRN medications. Monitoring skin and application of daily wound care to left foot. Turn every 2 hours. Hypertension improving. AKI on CKD.  BLE edema. Blood sugars with meals. Carb/mod diet. Blood sugars remain high. Patient fatigues quickly and requires a lot of rest breaks. Can be as much as CG but when fatigued can be min assist.  Patient on target to meet rehab goals: no, goals being downgraded.   *See Care Plan and progress notes for long and short-term goals.   Revisions to Treatment Plan:  Monitor BNP and labs.  Medication adjustments. Encourage PO intake. Family education Friday and Monday.  Teaching Needs: Medications, safety, self care, skin/wound care, transfer training, etc.  Current Barriers to Discharge: Decreased caregiver support and Wound care  Possible Resolutions to Barriers: Family education, skin/wound care, order recommended DME     Medical Summary Current Status: medically complicated by hypertension, AKI on CKD, hyperglycemia, edema, and anemia  Barriers  to Discharge: Cardiac Complications;Medical stability;Morbid Obesity;Renal Insufficiency/Failure;Self-care education;Uncontrolled Diabetes;Uncontrolled Hypertension;Volume Overload  Barriers to Discharge Comments: edema, HTN, AKI on CKD, hyperglycemia Possible Resolutions to [employer] Focus: TED and titration of BP medications for edema and BP control, monitorring labs and encouraging PO intakes, adjusting insulin   Continued Need for  Acute Rehabilitation Level of Care: The patient requires daily medical management by a physician with specialized training in physical medicine and rehabilitation for the following reasons: Direction of a multidisciplinary physical rehabilitation program to maximize functional independence : Yes Medical management of patient stability for increased activity during participation in an intensive rehabilitation regime.: Yes Analysis of laboratory values and/or radiology reports with any subsequent need for medication adjustment and/or medical intervention. : Yes   I attest that I was present, lead the team conference, and concur with the assessment and plan of the team.   Jearld Adjutant 08/16/2022, 2:32 PM

## 2022-08-16 NOTE — Progress Notes (Signed)
Occupational Therapy Session Note  Patient Details  Name: [name] MRN: [id number] Date of Birth: [birth year]  Today's Date: 08/16/2022 OT Individual Time: 4098-1191 OT Individual Time Calculation (min): 60 min    Short Term Goals: Week 1:  OT Short Term Goal 1 (Week 1): Patient will dress self with set up assist OT Short Term Goal 2 (Week 1): Patient will toilet self with supervision/ set up OT Short Term Goal 3 (Week 1): Patient will bathe self with set up assistance  Skilled Therapeutic Interventions/Progress Updates:    Patient agreeable to participate in OT session. Reports 0/10 pain level.   Patient participated in skilled OT session focusing on functional mobility, activity tolerance, endurance, and UB strength. Pt completed functional mobility utilizing RW to/from room to main gym with SBA. VC provided to walk closer to the walker with pt about to carry over education. No LOB or rest break needs. RW was lowered one notch after arriving to main gym for proper height needed. Pt demonstrated improved standing posture while navigating back to room from main gym.  Therapist educated on proper form and technique while completing UB exercises in order to promote proper muscle isolation needed to improve muscle strength requiring when completing bathing and dressing tasks.   Strengthening: - Seated, BUE, red band, shoulder horizontal abduction/adduction, IR/er, PNF 1&2 pattern, flexion, elbow extension, 2x10. Rest break taken in between sets.       Therapy Documentation Precautions:  Precautions Precautions: Fall Required Braces or Orthoses: Other Brace Other Brace: Darco Left  - chronic wound Restrictions Weight Bearing Restrictions: No  Therapy/Group: Individual Therapy  Limmie Patricia, OTR/L,CBIS  Supplemental OT - [facility] and [facility] Secure Chat Preferred   08/16/2022, 7:59 AM

## 2022-08-16 NOTE — Plan of Care (Signed)
  Problem: Consults Goal: RH STROKE PATIENT EDUCATION Description: See Patient Education module for education specifics  Outcome: Progressing   Problem: RH BOWEL ELIMINATION Goal: RH STG MANAGE BOWEL WITH ASSISTANCE Description: STG Manage Bowel with min Assistance. Outcome: Progressing Goal: RH STG MANAGE BOWEL W/MEDICATION W/ASSISTANCE Description: STG Manage Bowel with Medication with min Assistance. Outcome: Progressing   Problem: RH BLADDER ELIMINATION Goal: RH STG MANAGE BLADDER WITH ASSISTANCE Description: STG Manage Bladder With min Assistance Outcome: Progressing   Problem: RH SKIN INTEGRITY Goal: RH STG SKIN FREE OF INFECTION/BREAKDOWN Description: Skin will improve with healing of stage I to buttocks and prevention of any additional breakdown/infection to left foot with min assist  Outcome: Progressing Goal: RH STG MAINTAIN SKIN INTEGRITY WITH ASSISTANCE Description: STG Maintain Skin Integrity With min Assistance. Outcome: Progressing Goal: RH STG ABLE TO PERFORM INCISION/WOUND CARE W/ASSISTANCE Description: STG Able To Perform Incision/Wound Care With supervision Assistance. Outcome: Progressing   Problem: RH SAFETY Goal: RH STG ADHERE TO SAFETY PRECAUTIONS W/ASSISTANCE/DEVICE Description: STG Adhere to Safety Precautions With cueing Assistance/Device. Outcome: Progressing Goal: RH STG DECREASED RISK OF FALL WITH ASSISTANCE Description: STG Decreased Risk of Fall With min Assistance. Outcome: Progressing   Problem: RH COGNITION-NURSING Goal: RH STG ANTICIPATES NEEDS/CALLS FOR ASSIST W/ASSIST/CUES Description: STG Anticipates Needs/Calls for Assist With cueing Assistance/Cues. Outcome: Progressing   Problem: RH PAIN MANAGEMENT Goal: RH STG PAIN MANAGED AT OR BELOW PT'S PAIN GOAL Description: Pain will be managed at 4 out of 10 on pain scale with PRN medications min assist  Outcome: Progressing   Problem: RH KNOWLEDGE DEFICIT Goal: RH STG INCREASE KNOWLEDGE  OF HYPERTENSION Description: Patient/caregiver will be able to manage medications and diet modifications to improve hypertension from nursing education and nursing handout independently  Outcome: Progressing Goal: RH STG INCREASE KNOWLEGDE OF HYPERLIPIDEMIA Description: Patient/caregiver will be able to manage cholesterol medications and diet modifications to improve HDL levels from nursing education and nursing handouts independently  Outcome: Progressing Goal: RH STG INCREASE KNOWLEDGE OF STROKE PROPHYLAXIS Outcome: Progressing

## 2022-08-16 NOTE — Progress Notes (Signed)
Inpatient Rehabilitation  Patient information reviewed and entered into eRehab system by Nessie Nong M. Kharizma Lesnick, M.A., CCC/SLP, PPS Coordinator.  Information including medical coding, functional ability and quality indicators will be reviewed and updated through discharge.    

## 2022-08-17 ENCOUNTER — Telehealth: Payer: Self-pay | Admitting: Podiatry

## 2022-08-17 DIAGNOSIS — N183 Chronic kidney disease, stage 3 unspecified: Secondary | ICD-10-CM

## 2022-08-17 DIAGNOSIS — E1140 Type 2 diabetes mellitus with diabetic neuropathy, unspecified: Secondary | ICD-10-CM

## 2022-08-17 DIAGNOSIS — N393 Stress incontinence (female) (male): Secondary | ICD-10-CM

## 2022-08-17 DIAGNOSIS — I69998 Other sequelae following unspecified cerebrovascular disease: Secondary | ICD-10-CM

## 2022-08-17 DIAGNOSIS — Z794 Long term (current) use of insulin: Secondary | ICD-10-CM

## 2022-08-17 DIAGNOSIS — I10 Essential (primary) hypertension: Secondary | ICD-10-CM

## 2022-08-17 DIAGNOSIS — N3941 Urge incontinence: Secondary | ICD-10-CM

## 2022-08-17 LAB — GLUCOSE, CAPILLARY
Glucose-Capillary: 160 mg/dL — ABNORMAL HIGH (ref 70–99)
Glucose-Capillary: 168 mg/dL — ABNORMAL HIGH (ref 70–99)
Glucose-Capillary: 144 mg/dL — ABNORMAL HIGH (ref 70–99)
Glucose-Capillary: 204 mg/dL — ABNORMAL HIGH (ref 70–99)

## 2022-08-17 LAB — BASIC METABOLIC PANEL
Anion gap: 9 (ref 5–15)
BUN: 46 mg/dL — ABNORMAL HIGH (ref 8–23)
CO2: 25 mmol/L (ref 22–32)
Calcium: 8.8 mg/dL — ABNORMAL LOW (ref 8.9–10.3)
Chloride: 102 mmol/L (ref 98–111)
Creatinine, Ser: 1.69 mg/dL — ABNORMAL HIGH (ref 0.44–1.00)
GFR, Estimated: 32 mL/min — ABNORMAL LOW (ref >60)
Glucose, Bld: 186 mg/dL — ABNORMAL HIGH (ref 70–99)
Potassium: 4.1 mmol/L (ref 3.5–5.1)
Sodium: 136 mmol/L (ref 135–145)

## 2022-08-17 MED ORDER — LACTATED RINGERS IV BOLUS
1000.0000 mL | Freq: Once | INTRAVENOUS | Status: AC
  Administered 2022-08-17: 1000 mL via INTRAVENOUS

## 2022-08-17 NOTE — Progress Notes (Signed)
Physical Therapy Session Note  Patient Details  Name: [name] MRN: [id number] Date of Birth: Aug 10, 1950  Today's Date: 08/17/2022 PT Individual Time: 0805-0900 PT Individual Time Calculation (min): 55 min   Short Term Goals: Week 1:  PT Short Term Goal 1 (Week 1): Pt will complete bed mobility with modI PT Short Term Goal 2 (Week 1): Pt will completed bed <> chair transfer with S + LRAD PT Short Term Goal 3 (Week 1): Pt will amb 132ft with S + LRAD PT Short Term Goal 4 (Week 1): Pt will navigate 5 steps with CGA + unilateral rail  Skilled Therapeutic Interventions/Progress Updates: Pt presented in bed sleeping but easily aroused. Pt reluctant to participate in therapy as pt states significant pain in ribs from previous day's therapies. PTA notified nsg who provided pain meds with am meds during session. PTA set up pt with hot packs aligned along ribs/flank while looking for nsg and remained on until pt received am meds. Once completed HOB elevated and pt performed bed mobilty with supervision and increased time. Pt provided with pants and pt donned with minA for threading LLE but was able to stand with close supervision and RW and pull pants over hips. Pt then indicated need for bathroom, performed ambulatory transfer with CGA and performed toilet transfers with CGA (pt with continent urinary void/BM). Pt able to compelte peri-care with set up and stood from toilet with CGA and increased effort and completed clothing management with close supervision. Pt then ambulated to EOB and while sitting donned shoes (incl off loading shoe with supervision). Pt then ambulated to ortho gym with RW and supervision with pt requiring intermittent cues for improved erect posture and maintaining close proximity to RW. Pt required seated rest break in ortho gym and then was able to ambulate back to room in same manner as prior. At EOB pt doffed shoes with supervision and returned to supine on flat bed with  supervision. Pt left in bed at end of session with bed alarm on, call bell within reach and needs met.      Therapy Documentation Precautions:  Precautions Precautions: Fall Required Braces or Orthoses: Other Brace Other Brace: Darco Left  - chronic wound Restrictions Weight Bearing Restrictions: No General:   Vital Signs: Therapy Vitals Temp: 97.6 F (36.4 C) Temp Source: Oral Pulse Rate: 62 Resp: 17 BP: (!) 125/56 Patient Position (if appropriate): Sitting Oxygen Therapy SpO2: 100 % O2 Device: Room Air    Therapy/Group: Individual Therapy  Shagun Wordell 08/17/2022, 4:30 PM

## 2022-08-17 NOTE — Telephone Encounter (Signed)
Patient's sister called stating the patient had a stroke and is in the hospital and will be discharged from rehab on 08/25/22 and she needs to reschedule. Please call patients sister (number in chart). Thanks!

## 2022-08-17 NOTE — Progress Notes (Signed)
Occupational Therapy Session Note  Patient Details  Name: [name] MRN: [id number] Date of Birth: 01-May-1950  Today's Date: 08/17/2022 OT Individual Time: 1415-1500 OT Individual Time Calculation (min): 45 min    Short Term Goals: Week 1:  OT Short Term Goal 1 (Week 1): Patient will dress self with set up assist OT Short Term Goal 2 (Week 1): Patient will toilet self with supervision/ set up OT Short Term Goal 3 (Week 1): Patient will bathe self with set up assistance  Skilled Therapeutic Interventions/Progress Updates:    Pt received sitting up with pain reported in her chest and abdomen 5/10, agreeable to use rest breaks as pain management. She completed 120 ft of functional mobility with the RW with close (S). Extensive rest breaks required throughout session 2/2 fatigue. Pt completed blocked practice sit <> stand, 3x10 repetitions with no UE support, to challenge generalized strengthening for ADL transfers, to reinforce proper body mechanics/positioning, as well as increasing functional activity tolerance and cardiorespiratory endurance. Pt required CGA- (S) overall, and cueing for motivation/encouragement. In her second and third sets she struggled on her final repetitions. She completed functional mobility back to the room with (S) using the RW. She was left supine with all needs met. Bed alarm set.    Therapy Documentation Precautions:  Precautions Precautions: Fall Required Braces or Orthoses: Other Brace Other Brace: Darco Left  - chronic wound Restrictions Weight Bearing Restrictions: No  Therapy/Group: Individual Therapy  Crissie Reese 08/17/2022, 6:44 AM

## 2022-08-17 NOTE — Progress Notes (Signed)
Physical Therapy Session Note  Patient Details  Name: JUNI FASS MRN: [id number] Date of Birth: 1950-10-28  Today's Date: 08/17/2022 PT Individual Time: 1300-1345 PT Individual Time Calculation (min): 45 min   Short Term Goals: Week 1:  PT Short Term Goal 1 (Week 1): Pt will complete bed mobility with modI PT Short Term Goal 2 (Week 1): Pt will completed bed <> chair transfer with S + LRAD PT Short Term Goal 3 (Week 1): Pt will amb 164ft with S + LRAD PT Short Term Goal 4 (Week 1): Pt will navigate 5 steps with CGA + unilateral rail  Skilled Therapeutic Interventions/Progress Updates:  Patient greeted supine in bed and eventually agreeable to PT treatment session with encouragement. Patient transitioned to sitting EOB with Supv and while sitting EOB, therapist donned tennis shoe and DARCO boot for time management and energy conservation. Patient stood from EOB with RW and CGA/MinA for initiating standing with multiple attempts required. Patient ambulated to/from the bathroom with RW and CGA- VC for improved postural extension and stepping within the RW throughout. Patient was able to manage clothing and pericare with CGA for safety. Patient then stood at the sink with RW and CGA- VC for improved safety awareness. Therapist managing IV pole throughout entire treatment session.  Patient gait trained from her room to ortho gym with RW and CGA- VC for stepping within the RW, forward gaze/postural extension, B TKE during stance phase and increased B step length with good improvements noted however unable to sustain especially as she fatigues.   Patient performed 2 x 5 sit/stands from arm chair without the use of RW and CGA- VC for increased cadence with activity in order to address strength/endurance with good effort noted. Patient required ~30-45 second rest break in between sets. Therapist providing facilitation for improved postural extension.   Patient gait trained back to her room with RW  and CGA- Same VC as above with improvements noted, however reverts to poor gait mechanics as she fatigues.   Patient left sitting upright in wheelchair with chair alarm on, call bell within reach, tray table in front and all needs met.    Therapy Documentation Precautions:  Precautions Precautions: Fall Required Braces or Orthoses: Other Brace Other Brace: Darco Left  - chronic wound Restrictions Weight Bearing Restrictions: No  Pain: No/Denies pain.   Therapy/Group: Individual Therapy  Tyriq Moragne 08/17/2022, 7:55 AM

## 2022-08-17 NOTE — Progress Notes (Signed)
Physical Therapy Session Note  Patient Details  Name: [name] MRN: [id number] Date of Birth: [birth year]  Today's Date: 08/17/2022 PT Individual Time: 2130-8657 PT Individual Time Calculation (min): 39 min   Short Term Goals: Week 1:  PT Short Term Goal 1 (Week 1): Pt will complete bed mobility with modI PT Short Term Goal 2 (Week 1): Pt will completed bed <> chair transfer with S + LRAD PT Short Term Goal 3 (Week 1): Pt will amb 166ft with S + LRAD PT Short Term Goal 4 (Week 1): Pt will navigate 5 steps with CGA + unilateral rail  Skilled Therapeutic Interventions/Progress Updates:      Pt supine in bed upon arrival. Pt agreeable to therapy. Pt reports 7/10 chest soreness. Therapist managing IV pole throughout session.   Pt O2 on RA ranged from 88-97. Verbal cues provided for diaphragmatic breathing during activity and with rest breaks.   Pt ambulated 150 ft from room to main gym with RW and CGA, with WC in tow for safety, verbal and tactile cues provided for safety within RW, reciprocal gait, and heel/toe pattern with minimal change in gait pattern. Therapist provided demonstration of walking within walker, reciprocal gait and heel toe pattern, pt verbalized teach back. Pt ambulated 1x50 feet with RW with improved gait pattern, intemittent VC needed for heel toe gait.   Pt ascended/descended 8 6 inch steps with B UE support and CGA/min A with one episode of LOB, pt able to correct with stepping strategy and CGA for stabilization. Pt demos recall of step to gait with intermittent verbal cues needed for sequencing.  Progressed to stair navigation x4 6 inch steps with unilateral UE support and step to gait with CGA/min A-mod A as pt demos fear of failling with descending stairs with unilateral UE support.   Pt performed sit <>stand x6 with CGA throughout session, verbal cues provided for handplacement.   Pt transported dependent in [facility] for time. Pt seated in WC at end of session with  all needs within reach and seatbelt alarm on.     Therapy Documentation Precautions:  Precautions Precautions: Fall Required Braces or Orthoses: Other Brace Other Brace: Darco Left  - chronic wound Restrictions Weight Bearing Restrictions: No General:   Vital Signs: Therapy Vitals Temp: 97.7 F (36.5 C) Temp Source: Oral Pulse Rate: 62 Resp: 17 BP: (!) 120/94 Patient Position (if appropriate): Sitting Oxygen Therapy O2 Device: Room Air Pain:   Mobility:   Locomotion :    Trunk/Postural Assessment :    Balance:   Exercises:   Other Treatments:      Therapy/Group: Individual Therapy  Ambrose Finland 08/17/2022, 7:43 AM

## 2022-08-17 NOTE — Progress Notes (Signed)
[name], female   DOB: [birth year], 72 y.o.   MRN: [id number]  SW faxed outpatient PT/OT referral to [facility] (p:[phone]/f:[phone]).  Cecile Sheerer, MSW, LCSWA Office: [phone] Cell: [phone] Fax: [phone]

## 2022-08-17 NOTE — Progress Notes (Signed)
PROGRESS NOTE   Subjective/Complaints:  No events overnight, no acute complaints.  Patient feels overall tired this a.m., states that she was overworking therapy yesterday and is sore today.  ROS:  + Pain -diffuse, aching  +stress incontinence -ongoing Denies fevers, chills, N/V, abdominal pain, constipation, diarrhea, SOB, cough, chest pain, new weakness or paraesthesias.    Objective:   No results found. Recent Labs    08/15/22 0542  WBC 5.2  HGB 9.0*  HCT 27.7*  PLT 254    Recent Labs    08/15/22 0542 08/17/22 0552  NA 138 136  K 3.8 4.1  CL 104 102  CO2 24 25  GLUCOSE 165* 186*  BUN 37* 46*  CREATININE 1.64* 1.69*  CALCIUM 8.3* 8.8*     Intake/Output Summary (Last 24 hours) at 08/17/2022 0814 Last data filed at 08/17/2022 0747 [place] per 24 hour  Intake 887 ml  Output 350 ml  Net 537 ml         Physical Exam: Vital Signs Blood pressure (!) 120/94, pulse 62, temperature 97.7 F (36.5 C), temperature source Oral, resp. rate 17, height 5\' 9"  (1.753 m), weight 105.5 kg, SpO2 100 %.   General: No acute distress.  Sitting up at bedside, getting ready to work with therapies. +obese Heart: Regular rate and rhythm no rubs murmurs or extra sounds Lungs: Clear to auscultation, breathing unlabored, no rales or wheezes Abdomen: Positive bowel sounds, soft nontender to palpation, nondistended Extremities: No clubbing, cyanosis. + BL LE edema 3+ -unchanged Skin: + R MTP wound - chronic, stable, covered in mepilex + Buttocks skin tear - stable Neurologic: AAOx4.  No apparent deficits. Appropriate mood and affect. Cranial nerves II through XII intact,  motor strength is 5/5 BL UE , Bilateral lower extremities 4- out of 5 proximally, 4+ out of 5 distally  Sensory exam reduced sensation to light touch  lower extremities to the knees-unchanged Musculoskeletal: Full range of motion in all 4 extremities.     Assessment/Plan: 1. Functional deficits which require 3+ hours per day of interdisciplinary therapy in a comprehensive inpatient rehab setting. Physiatrist is providing close team supervision and 24 hour management of active medical problems listed below. Physiatrist and rehab team continue to assess barriers to discharge/monitor patient progress toward functional and medical goals  Care Tool:  Bathing    Body parts bathed by patient: Right arm, Left arm, Abdomen, Chest, Front perineal area, Buttocks, Right upper leg, Left upper leg, Right lower leg, Left lower leg, Face         Bathing assist Assist Level: Contact Guard/Touching assist     Upper Body Dressing/Undressing Upper body dressing   What is the patient wearing?: Pull over shirt    Upper body assist Assist Level: Set up assist    Lower Body Dressing/Undressing Lower body dressing      What is the patient wearing?: Incontinence brief, Pants     Lower body assist Assist for lower body dressing: Minimal Assistance - Patient > 75%     Toileting Toileting    Toileting assist Assist for toileting: Independent with assistive device Assistive Device Comment:  (walker)   Transfers Chair/bed transfer  Transfers assist     Chair/bed transfer assist level: Minimal Assistance - Patient > 75%     Locomotion Ambulation   Ambulation assist      Assist level: Contact Guard/Touching assist Assistive device: Walker-rolling Max distance: 60   Walk 10 feet activity   Assist     Assist level: Contact Guard/Touching assist Assistive device: Walker-rolling   Walk 50 feet activity   Assist    Assist level: Contact Guard/Touching assist Assistive device: Walker-rolling    Walk 150 feet activity   Assist Walk 150 feet activity did not occur: Safety/medical concerns (2/2 fatigue)         Walk 10 feet on uneven surface  activity   Assist     Assist level: Minimal Assistance - Patient >  75% Assistive device: Walker-rolling   Wheelchair     Assist Is the patient using a wheelchair?: No             Wheelchair 50 feet with 2 turns activity    Assist            Wheelchair 150 feet activity     Assist          Blood pressure (!) 120/94, pulse 62, temperature 97.7 F (36.5 C), temperature source Oral, resp. rate 17, height 5\' 9"  (1.753 m), weight 105.5 kg, SpO2 100 %.  Medical Problem List and Plan: 1. Functional deficits secondary to right MCA territory infarcts on 08/08/2022 and left subcortical lacunar infarct.  Recommendations for 30-day cardiac event monitor             -patient may shower             -ELOS/Goals: 10-14 days, SPV PT/OT/SLP; DC date 6/6  - rec therapy consult per pt request 5/28   - 5/28: CGA OT currently, PT states very debilitated, very poor standing tolerance. She is incontinent when standing but resists wearing brief; SPV goals.    - Family Ed monday  2.  Antithrombotics: -DVT/anticoagulation:  Pharmaceutical: Lovenox             -antiplatelet therapy: Aspirin 81 mg daily and Plavix 75 mg daily (6/10)x 3 weeks then aspirin alone 3. Pain Management: Oxycodone as needed 4. Mood/Behavior/Sleep: Lexapro 20 mg daily, Wellbutrin 300 mg daily             -antipsychotic agents: N/A 5. Neuropsych/cognition: This patient is capable of making decisions on her own behalf. 6. Skin/Wound Care: Skin care as advised to left foot wound.  Routine skin checks..  Follow-up outpatient podiatry             - Mild sacral wound, small tear with minimal bleeding; cover and ensure no soiling - WOCN saw 5/26   7. Fluids/Electrolytes/Nutrition: Routine in and outs with follow-up chemistries 8.  E. coli UTI.  Intravenous Rocephin 08/09/2022 9.  Hypertension.  Cozaar 50 mg daily, Coreg 6.25 mg twice daily.  Monitor with increased mobility  - 5/27: BP mildly elevated, monitor with activity this week and consider increase in Cozaar  - 5/29: BP  improved this AM; monitor  Vitals:   08/16/22 2006 08/17/22 0409  BP:  (!) 120/94  Pulse: 61 62  Resp:  17  Temp:  97.7 F (36.5 C)  SpO2: 100%     10.  Hyperlipidemia.  Lipitor 11.  Diabetes mellitus with peripheral neuropathy.  Hemoglobin A1c 7.0.  Currently on SSI and Semglee 15 units nightly.  Diabetic teaching.    -  5/27: Poorly controlled, increase Semglee to 18 U  - 5/28: Increase Semglee to 22 U - improved; monitor on current regimen Recent Labs    08/16/22 1644 08/16/22 2115 08/17/22 0646  GLUCAP 108* 183* 160*        12.  Obesity.  BMI 34.21.  Dietary follow-up 13.  AKI on CKD stage III.  Most recent creatinine 1.42 earlier this month.  Follow-up chemistries  - 5/27: BUN/Cr uptrending to 37/1.64 this AM; encourage fluids, repeat in 2 days  - 5/29: BUN/Cr increased; add 1 L IVF LR 100/hr today, recheck in AM  14.  History of ductal carcinoma in situ of left breast.  Status postlumpectomy radioactive seed 2021.  Will discuss resuming Nolvadex.  Follow-up Dr. Pamelia Hoit 15.Right eighth rib fracture from fall.Conservative care  - Adding lidocaine patches to R chest  16.  Acute anemia.  Follow-up CBC.   17. Edema. TEDs (can cut out toes for wounds) and elevate legs in bed.  Given creatinine at baseline, consider addition of diuretic for edema if worsening. - hold off until Cr stabilizes  - weights stable, downtrending.  Filed Weights   08/12/22 1553 08/15/22 0500 08/16/22 0600  Weight: 108.3 kg 103.7 kg 105.5 kg   18.  Urge/Stress incontinence. PVRS x3 days. Encourage double voiding. Cannot use 5-alpha reductase inhibitors d/t sulfa allergy. Will hold off and monitor with above measures, consider Ditropan.     LOS: 5 days A FACE TO FACE EVALUATION WAS PERFORMED  [name] 08/17/2022, 8:14 AM

## 2022-08-18 LAB — BASIC METABOLIC PANEL
Anion gap: 7 (ref 5–15)
BUN: 39 mg/dL — ABNORMAL HIGH (ref 8–23)
CO2: 25 mmol/L (ref 22–32)
Calcium: 8.5 mg/dL — ABNORMAL LOW (ref 8.9–10.3)
Chloride: 104 mmol/L (ref 98–111)
Creatinine, Ser: 1.83 mg/dL — ABNORMAL HIGH (ref 0.44–1.00)
GFR, Estimated: 29 mL/min — ABNORMAL LOW (ref >60)
Glucose, Bld: 254 mg/dL — ABNORMAL HIGH (ref 70–99)
Potassium: 4.2 mmol/L (ref 3.5–5.1)
Sodium: 136 mmol/L (ref 135–145)

## 2022-08-18 LAB — GLUCOSE, CAPILLARY
Glucose-Capillary: 127 mg/dL — ABNORMAL HIGH (ref 70–99)
Glucose-Capillary: 127 mg/dL — ABNORMAL HIGH (ref 70–99)
Glucose-Capillary: 216 mg/dL — ABNORMAL HIGH (ref 70–99)
Glucose-Capillary: 136 mg/dL — ABNORMAL HIGH (ref 70–99)

## 2022-08-18 NOTE — Progress Notes (Signed)
Occupational Therapy Session Note  Patient Details  Name: [name] MRN: [id number] Date of Birth: 05/17/1950  Today's Date: 08/18/2022 Session 1 OT Individual Time: 4098-1191 OT Individual Time Calculation (min): 70 min   Session 2 OT Individual Time: 1300-1400 OT Individual Time Calculation (min): 60 min    Short Term Goals: Week 1:  OT Short Term Goal 1 (Week 1): Patient will dress self with set up assist OT Short Term Goal 2 (Week 1): Patient will toilet self with supervision/ set up OT Short Term Goal 3 (Week 1): Patient will bathe self with set up assistance  Skilled Therapeutic Interventions/Progress Updates: Session 1    Pt greeted semi-reclined in bed and agreeable to OT treatment session. Pt wanted to shower this morning. Pt reported need to go to the bathroom first. Pt completed bed mobility with supervision. Min A to stand, then CGA to ambulate into bathroom with RW. Pt voided bladder and had successful BM. Supervision for hygiene in sitting position with hip hike. Pt doffed clothing seated on toilet with min A. Ambulation into shower with CGA. Bathing completed seated on tub bench usiung lateral leans to wash buttocks. Addressed standing endurance standing at the sink to brush hair and teeth. Educated on RW positioning at the sink for safety with pt able to demonstrate understanding. Pt needed rest break after standing grooming tasks. LB dressing completed with pt able to recall hemi dressing strategies from previous session. Overall CGA for standing when pulling up pants. OT educated on use of friction reducing bag to don TED hose. PT able to get B LE's into figure 4 position with min A. Overall mod A to get TED hose on. Pt will likely need assistance from sister with this task. Pt reported max fatigue and requested to lay down and rest at the end of session. Pt left semi-reclined in bed with bed alarm on, call bell in reach, and needs met.   Session 2 Pt greeted  semi-reclined in bed awake and agreeable to OT treatment session. Pt reported need to go to the bathroom. Pt completed bed mobility with CGA. Min A to don shoes and L off-loading shoes. Pt stood with min A from lower bed w/ RW, then ambulated into bathroom with CGA. Pt voided bladder and completed 3/3 toileting tasks with CGA/min A. Pt stood at the sink to wash hands with cues for RW positioning. Pt then ambulated to therapy gym w/ RW and CGA. Addressed UB there-ex and standing balance/endurance using 3 lb weighted bar. Pt stood with min A, but needed up to mod A with posterior bias with added balance challenge of UB there-ex. 3 sets of 10, chest press, bicep curl, and straight arm raises. Utilized foam block stepping up onto foam, then off, while maintaiing balance. Pt ambulated back to room w/ RW and CGA. Pt returned to bed and left semi-reclined in bed with bed alarm on, call bell in reach, and needs met.   Therapy Documentation Precautions:  Precautions Precautions: Fall Required Braces or Orthoses: Other Brace Other Brace: Darco Left  - chronic wound Restrictions Weight Bearing Restrictions: No Pain:  Denies pain  Therapy/Group: Individual Therapy  [name] 08/19/2022, 11:18 AM

## 2022-08-18 NOTE — Progress Notes (Signed)
Physical Therapy Session Note  Patient Details  Name: [name] MRN: [id number] Date of Birth: 09-18-1950  Today's Date: 08/18/2022 PT Individual Time: 0945-1100 PT Individual Time Calculation (min): 75 min   Short Term Goals: Week 1:  PT Short Term Goal 1 (Week 1): Pt will complete bed mobility with modI PT Short Term Goal 2 (Week 1): Pt will completed bed <> chair transfer with S + LRAD PT Short Term Goal 3 (Week 1): Pt will amb 131ft with S + LRAD PT Short Term Goal 4 (Week 1): Pt will navigate 5 steps with CGA + unilateral rail  Skilled Therapeutic Interventions/Progress Updates:  Patient greeted supine in bed and agreeable to PT treatment session. Patient transitioned to sitting EOB with Supv- While sitting EOB, therapist donned tennis shoe and darco boot for time management. Patient stood from EOB with RW and CGA- Patient ambulated to/from the bathroom with RW and CGA- VC throughout for staying within the frame of the RW. Patient was able to manage her clothing and perform pericare after continent void. Patient then stood at the sink to wash her hands with CGA for safety. Patient gait trained from her room to ortho gym with RW and CGA- VC for improved postural extension, forward gaze, heel-toe gait pattern and stepping within the frame of the RW with good improvements noted from yesterday's treatment session.   Patient completed circuit training in the ortho gym in order to increased overall strength, ROM, endurance and standing balance for improved functional mobility- -Alternating foot taps to 4" step with 2# donned to each ankle and RW for UE support, x20 total -Quick sit/stands with limited UE support, x10 total  -Standing unsupported with 4.4# medicine ball performing chest press, x10 total  Therapist providing CGA/MinA throughout entire circuit with multimodal cues for improved postural extension and B TKE. Patient performed x3 rounds total with 5 minute rest break in between  each round and 30-60 second rest break in between each exercise.   Patient attempted to perform sit/stand with 4.4# medicine ball, however only able to perform one and unable to perform any more even with multiple attempts with patient reporting increased fatigue. Patient gait trained back to her room with the same VC as above. Patient transitioned from sitting EOB to supine with supv. Patient left supine in bed with bed alarm on, call bell within reach and all needs met.    Therapy Documentation Precautions:  Precautions Precautions: Fall Required Braces or Orthoses: Other Brace Other Brace: Darco Left  - chronic wound Restrictions Weight Bearing Restrictions: No  Pain: No/Denies pain.    Therapy/Group: Individual Therapy  Kayly Kriegel 08/18/2022, 8:29 AM

## 2022-08-18 NOTE — Progress Notes (Signed)
PROGRESS NOTE   Subjective/Complaints:  No events overnight, no acute complaints.   Patient working with therapies in the gym, states she is having a "very good day".  Per PT, is working very hard today.  No discomfort with teds, no increased edema, no calf tenderness, doing well.  ROS:  + Pain -diffuse, aching -improved +stress incontinence -ongoing Denies fevers, chills, N/V, abdominal pain, constipation, diarrhea, SOB, cough, chest pain, new weakness or paraesthesias.    Objective:   No results found. No results for input(s): "WBC", "HGB", "HCT", "PLT" in the last 72 hours.  Recent Labs    08/17/22 0552  NA 136  K 4.1  CL 102  CO2 25  GLUCOSE 186*  BUN 46*  CREATININE 1.69*  CALCIUM 8.8*     Intake/Output Summary (Last 24 hours) at 08/18/2022 1238 Last data filed at 08/18/2022 0744 [place] per 24 hour  Intake 812 ml  Output --  Net 812 ml         Physical Exam: Vital Signs Blood pressure (!) 152/47, pulse (!) 57, temperature (!) 97.5 F (36.4 C), resp. rate 16, height 5\' 9"  (1.753 m), weight 105.5 kg, SpO2 100 %.   General: No acute distress.  Sitting in gym with PT. +obese Heart: Regular rate and rhythm no rubs murmurs or extra sounds Lungs: Clear to auscultation, breathing unlabored, no rales or wheezes Abdomen: Positive bowel sounds, soft nontender to palpation, nondistended Extremities: No clubbing, cyanosis. + BL LE edema 3+, nonpitting-unchanged Skin: + R MTP wound - chronic, stable, covered in mepilex + Buttocks skin tear - stable Neurologic: AAOx4.  No apparent deficits. Appropriate mood and affect. Cranial nerves II through XII intact,  motor strength is 5/5 BL UE , Bilateral lower extremities 4 out of 5; antigravity with ankle weights Sensory exam reduced sensation to light touch  lower extremities to the knees-unchanged Musculoskeletal: Full range of motion in all 4 extremities.     Assessment/Plan: 1. Functional deficits which require 3+ hours per day of interdisciplinary therapy in a comprehensive inpatient rehab setting. Physiatrist is providing close team supervision and 24 hour management of active medical problems listed below. Physiatrist and rehab team continue to assess barriers to discharge/monitor patient progress toward functional and medical goals  Care Tool:  Bathing    Body parts bathed by patient: Right arm, Left arm, Abdomen, Chest, Front perineal area, Buttocks, Right upper leg, Left upper leg, Right lower leg, Left lower leg, Face         Bathing assist Assist Level: Contact Guard/Touching assist     Upper Body Dressing/Undressing Upper body dressing   What is the patient wearing?: Pull over shirt    Upper body assist Assist Level: Set up assist    Lower Body Dressing/Undressing Lower body dressing      What is the patient wearing?: Incontinence brief, Pants     Lower body assist Assist for lower body dressing: Minimal Assistance - Patient > 75%     Toileting Toileting    Toileting assist Assist for toileting: Independent with assistive device Assistive Device Comment:  (walker)   Transfers Chair/bed transfer  Transfers assist     Chair/bed transfer  assist level: Minimal Assistance - Patient > 75%     Locomotion Ambulation   Ambulation assist      Assist level: Contact Guard/Touching assist Assistive device: Walker-rolling Max distance: 60   Walk 10 feet activity   Assist     Assist level: Contact Guard/Touching assist Assistive device: Walker-rolling   Walk 50 feet activity   Assist    Assist level: Contact Guard/Touching assist Assistive device: Walker-rolling    Walk 150 feet activity   Assist Walk 150 feet activity did not occur: Safety/medical concerns (2/2 fatigue)         Walk 10 feet on uneven surface  activity   Assist     Assist level: Minimal Assistance - Patient >  75% Assistive device: Walker-rolling   Wheelchair     Assist Is the patient using a wheelchair?: No             Wheelchair 50 feet with 2 turns activity    Assist            Wheelchair 150 feet activity     Assist          Blood pressure (!) 152/47, pulse (!) 57, temperature (!) 97.5 F (36.4 C), resp. rate 16, height 5\' 9"  (1.753 m), weight 105.5 kg, SpO2 100 %.  Medical Problem List and Plan: 1. Functional deficits secondary to right MCA territory infarcts on 08/08/2022 and left subcortical lacunar infarct.  Recommendations for 30-day cardiac event monitor             -patient may shower             -ELOS/Goals: 10-14 days, SPV PT/OT/SLP; DC date 6/6  - rec therapy consult per pt request 5/28   - 5/28: CGA OT currently, PT states very debilitated, very poor standing tolerance. She is incontinent when standing but resists wearing brief; SPV goals.    - Family Ed monday  2.  Antithrombotics: -DVT/anticoagulation:  Pharmaceutical: Lovenox             -antiplatelet therapy: Aspirin 81 mg daily and Plavix 75 mg daily (6/10)x 3 weeks then aspirin alone 3. Pain Management: Oxycodone as needed 4. Mood/Behavior/Sleep: Lexapro 20 mg daily, Wellbutrin 300 mg daily             -antipsychotic agents: N/A 5. Neuropsych/cognition: This patient is capable of making decisions on her own behalf. 6. Skin/Wound Care: Skin care as advised to left foot wound.  Routine skin checks..  Follow-up outpatient podiatry             - Mild sacral wound, small tear with minimal bleeding; cover and ensure no soiling - WOCN saw 5/26   7. Fluids/Electrolytes/Nutrition: Routine in and outs with follow-up chemistries 8.  E. coli UTI.  Intravenous Rocephin 08/09/2022 9.  Hypertension.  Cozaar 50 mg daily, Coreg 6.25 mg twice daily.  Monitor with increased mobility  - 5/27: BP mildly elevated, monitor with activity this week and consider increase in Cozaar  - 5/29: BP improved this AM;  monitor  5/30: BP mildly elevated this a.m., heart rate low/stable.  Trend, if additional agent needed will consider Norvasc 5 mg.  Vitals:   08/17/22 1936 08/18/22 0422  BP: (!) 147/45 (!) 152/47  Pulse: 65 (!) 57  Resp: 18 16  Temp: 98.1 F (36.7 C) (!) 97.5 F (36.4 C)  SpO2: 100% 100%    10.  Hyperlipidemia.  Lipitor 11.  Diabetes mellitus with peripheral neuropathy.  Hemoglobin  A1c 7.0.  Currently on SSI and Semglee 15 units nightly.  Diabetic teaching.    - 5/27: Poorly controlled, increase Semglee to 18 U  - 5/28: Increase Semglee to 22 U - improved; monitor on current regimen  -5/30: If BMP stable today, will restart metformin 1000 mg twice daily in a.m. Recent Labs    08/17/22 2047 08/18/22 0626 08/18/22 1137  GLUCAP 204* 127* 127*        12.  Obesity.  BMI 34.21.  Dietary follow-up 13.  AKI on CKD stage III.  Most recent creatinine 1.42 earlier this month.  Follow-up chemistries  - 5/27: BUN/Cr uptrending to 37/1.64 this AM; encourage fluids, repeat in 2 days  - 5/29: BUN/Cr increased; add 1 L IVF LR 100/hr today, recheck in AM -pending  14.  History of ductal carcinoma in situ of left breast.  Status postlumpectomy radioactive seed 2021.  Will discuss resuming Nolvadex.  Follow-up Dr. Pamelia Hoit 15.Right eighth rib fracture from fall.Conservative care  - Adding lidocaine patches to R chest  16.  Acute anemia.  Follow-up CBC.   17. Edema. TEDs (can cut out toes for wounds) and elevate legs in bed.  Given creatinine at baseline, consider addition of diuretic for edema if worsening. - hold off until Cr stabilizes  18.  Urge/Stress incontinence. PVRS x3 days. Encourage double voiding. Cannot use 5-alpha reductase inhibitors d/t sulfa allergy. Will hold off and monitor with above measures, consider Ditropan.  -5-30: PVRs remain low, no incontinence documented.  Continue with double voiding only.  No medication intervention for now.    LOS: 6 days A FACE TO FACE  EVALUATION WAS PERFORMED  [name] 08/18/2022, 12:38 PM

## 2022-08-18 NOTE — Evaluation (Signed)
Recreational Therapy Assessment and Plan  Patient Details  Name: [name] MRN: [id number] Date of Birth: 1950-08-05 Today's Date: 08/18/2022  Rehab Potential: Good ELOS: d/c 6/6   Assessment  Hospital Problem: Principal Problem:   Right [facility])     Past Medical History:      Past Medical History:  Diagnosis Date   Breast cancer (HCC) 12/2019    left breast DCIS   CKD (chronic kidney disease) 06/07/2016    Stage 2, GFR 60-89 ml/min   Diabetic polyneuropathy 06/07/2016   Dyslipidemia     Endometrial adenocarcinoma 2012   Fever blister     Hyperlipidemia     Hypertension, essential 06/07/2016   Major depressive disorder     Mixed dyslipidemia 06/07/2016   MRSA infection 10/2019    left foot wound   Obstructive sleep apnea 06/07/2016    does not use CPAP   Peripheral neuropathy     Retinopathy     Severe obesity     Skin ulcer of left foot with fat layer exposed 11/02/2016   Stroke      Asymptomatic, discovered via neuroimaging; small infarct in left parietal lobe; also concern for small b/l frontal infarcts   Tachycardia 07/19/2017   Type 2 diabetes mellitus with hyperglycemia, with long-term current use of insulin 06/07/2016   Unilateral primary osteoarthritis, right knee 04/16/2019    Past Surgical History:       Past Surgical History:  Procedure Laterality Date   ADRENALECTOMY       BREAST LUMPECTOMY WITH RADIOACTIVE SEED LOCALIZATION Left 02/25/2020    Procedure: LEFT BREAST LUMPECTOMY WITH RADIOACTIVE SEED LOCALIZATION;  Surgeon: Abigail Miyamoto, MD;  Location: [facility];  Service: General;  Laterality: Left;   BUNIONECTOMY       CARDIAC CATHETERIZATION       CERVICAL SPINE SURGERY       CHOLECYSTECTOMY       DILATION AND CURETTAGE OF UTERUS       LAPAROSCOPIC SALPINGOOPHERECTOMY       THORACOTOMY       TONSILLECTOMY          Assessment & Plan Clinical Impression: 72 year old right-handed female with a history  of 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 Per chart review patient lives alone. 1 level home 5 steps to entry. Independent prior to admission but did use a cane when outside of the home. Presented 08/08/2022 with progressive weakness and recurrent falls. Per her sister after most recent fall patient sat on the floor for almost 10 hours and could not get up without assistance and was found covered in urine and feces.. Cranial CT scan showed a 16 x 5 mm infarct within the left corona radiata/caudate nucleus, new from prior brain MRI 06/05/2019 but otherwise age-indeterminate. No acute posttraumatic intracranial findings. Redemonstrated chronic cortically based infarcts within bilateral frontal and left parietal lobes. CT cervical spine negative for fracture or dislocation. MRI multifocal bilateral acute ischemia, predominantly affecting the deep gray matter. There was also 2 cortical foci within the right MCA territory. No hemorrhage or mass effect. Old bilateral MCA territory infarcts. Chest X RAY suspect acute fracture right eighth rib. Patient did not receive TNK. As well as swelling of the lower extremities however patient had not been taking her Lasix for several months. MRA of  the head was unremarkable. Admission chemistries unremarkable except sodium 134 glucose 172 BUN 45 creatinine 2.29, WBC 14,800, CK 1265, urine culture greater than 100,000 E. coli, hemoglobin A1c 7.0. Carotid Dopplers with left 40 to 59% ICA stenosis. Echocardiogram ejection fraction of 60 to 65% no wall motion abnormality grade 1 diastolic dysfunction. Neurology follow-up patient placed on low-dose aspirin and Plavix for CVA prophylaxis x 3 weeks then aspirin alone. Recommendations of 30-day cardiac event monitor Lovenox added for DVT prophylaxis.  Placed on course of intravenous Rocephin for UTI. Follow-up hemoglobin 08/11/2022 of 8.3 that has steadily declined from 12.52 weeks prior with no visible signs of bleeding initial concern for possible relation to hemoconcentration his WBC and platelets have also dropped. FOBT pending and anemia panel. His follow-up CBC the following day had improved to 9.5. WOC consulted for left foot wound followed by podiatry Dr. Ardelle Anton and recently did see the patient 5/13 and debrided the wound as well as pressure injury right/left buttocks.  Patient transferred to CIR on 08/12/2022 .    Pt presents with decreased activity tolerance, decreased functional mobility, decreased balance, feelings of stress Limiting pt's independence with leisure/community pursuits.  Met with pt today to discuss TR services including leisure education, activity analysis/modifications and stress management.  Also discussed the importance of social, emotional, spiritual health in addition to physical health and their effects on overall health and wellness.  Pt stated understanding.  Leisure History/Participation Premorbid leisure interest/current participation: Games - Cross-word Expression Interests: Music (Comment);Singing Other Leisure Interests: Television;Reading Leisure Participation Style: Alone;With Family/Friends Awareness of Community Resources: Good-identify 3 post discharge leisure resources Psychosocial / Spiritual Spiritual Interests: Church;Other (Comment) (singing in the choir) Stress Management: Fair Methods of Stress Management: reading Patient agreeable to Pet Therapy: Yes Social interaction - Mood/Behavior: Cooperative Film/video editor for Education?: Yes Recreational Therapy Orientation Orientation -Reviewed with patient: Available activity resources Strengths/Weaknesses Patient Strengths/Abilities: Willingness to participate Patient weaknesses: Physical limitations;Minimal Premorbid Leisure  Activity TR Patient demonstrates impairments in the following area(s): Endurance TR Additional Impairment(s): Leisure Awareness  Plan Rec Therapy Plan Is patient appropriate for Therapeutic Recreation?: Yes Rehab Potential: Good Treatment times per week: Min 1 TR session>20 minutes during LOS Estimated Length of Stay: d/c 6/6 TR Treatment/Interventions: Adaptive equipment instruction;Community reintegration;Patient/family education;Therapeutic exercise;1:1 session;Functional mobility training;UE/LE Coordination activities;Balance/vestibular training;Group participation (Comment);Recreation/leisure participation;Leisure education;Therapeutic activities Recommendations for other services: Neuropsych  Recommendations for other services: Neuropsych  Discharge Criteria: Patient will be discharged from TR if patient refuses treatment 3 consecutive times without medical reason.  If treatment goals not met, if there is a change in medical status, if patient makes no progress towards goals or if patient is discharged from hospital.  The above assessment, treatment plan, treatment alternatives and goals were discussed and mutually agreed upon: by patient  Zykera Abella 08/18/2022, 12:54 PM

## 2022-08-18 NOTE — Progress Notes (Addendum)
Attempted to see patient. Not in room at this time.  Met with patient. Reports that things are going well. Said that staff is quick to get to her room when she calls for assistance to get to the bathroom. Discussed diet with patient regarding insulin and A1C and cholesterol. Reports that takes insulin at home and checks blood sugars. York Spaniel that was told that has had multiple strokes and never knew it. Discussed getting out of the house and going to CVA peer group. Doesn't get out much since mother passed in 2020.  Reports that she is depressed but already on medication but doesn't seem to change things. She doesn't drive either due to neuropathy in bilateral lower extremities. Asked if had church family that could visit or reconnect with. Said, "maybe".  York Spaniel that her sister is helpful and here almost every day.  Did discuss 8am discharge time and she said that would let her sister know.  Informed her that once she gets in on day of discharge we will contact PA and they will go over discharge instructions.  All needs met.

## 2022-08-19 DIAGNOSIS — S91302A Unspecified open wound, left foot, initial encounter: Secondary | ICD-10-CM

## 2022-08-19 DIAGNOSIS — S91301A Unspecified open wound, right foot, initial encounter: Secondary | ICD-10-CM

## 2022-08-19 LAB — CREATININE, SERUM
Creatinine, Ser: 1.77 mg/dL — ABNORMAL HIGH (ref 0.44–1.00)
GFR, Estimated: 30 mL/min — ABNORMAL LOW (ref >60)

## 2022-08-19 LAB — GLUCOSE, CAPILLARY
Glucose-Capillary: 133 mg/dL — ABNORMAL HIGH (ref 70–99)
Glucose-Capillary: 214 mg/dL — ABNORMAL HIGH (ref 70–99)
Glucose-Capillary: 158 mg/dL — ABNORMAL HIGH (ref 70–99)
Glucose-Capillary: 280 mg/dL — ABNORMAL HIGH (ref 70–99)

## 2022-08-19 MED ORDER — BACITRACIN ZINC 500 UNIT/GM EX OINT
TOPICAL_OINTMENT | Freq: Every day | CUTANEOUS | Status: DC
  Administered 2022-08-23: 31.5 via TOPICAL
  Filled 2022-08-19: qty 28.4

## 2022-08-19 MED ORDER — ZINC SULFATE 220 (50 ZN) MG PO CAPS
220.0000 mg | ORAL_CAPSULE | Freq: Every day | ORAL | Status: DC
  Administered 2022-08-19 – 2022-08-25 (×7): 220 mg via ORAL
  Filled 2022-08-19 (×7): qty 1

## 2022-08-19 MED ORDER — NEPRO/CARBSTEADY PO LIQD
237.0000 mL | Freq: Two times a day (BID) | ORAL | Status: DC
  Administered 2022-08-19 – 2022-08-24 (×9): 237 mL via ORAL

## 2022-08-19 MED ORDER — LOSARTAN POTASSIUM 25 MG PO TABS
25.0000 mg | ORAL_TABLET | Freq: Every morning | ORAL | Status: DC
  Administered 2022-08-20 – 2022-08-25 (×6): 25 mg via ORAL
  Filled 2022-08-19 (×6): qty 1

## 2022-08-19 MED ORDER — AMLODIPINE BESYLATE 5 MG PO TABS
5.0000 mg | ORAL_TABLET | Freq: Every day | ORAL | Status: DC
  Administered 2022-08-19 – 2022-08-23 (×5): 5 mg via ORAL
  Filled 2022-08-19 (×3): qty 1
  Filled 2022-08-19: qty 2
  Filled 2022-08-19: qty 1

## 2022-08-19 MED ORDER — VITAMIN C 500 MG PO TABS
500.0000 mg | ORAL_TABLET | Freq: Every day | ORAL | Status: DC
  Administered 2022-08-19 – 2022-08-25 (×7): 500 mg via ORAL
  Filled 2022-08-19 (×7): qty 1

## 2022-08-19 MED ORDER — HYDRALAZINE HCL 10 MG PO TABS: 10.0000 mg | ORAL_TABLET | Freq: Four times a day (QID) | ORAL | Status: DC | PRN

## 2022-08-19 NOTE — Progress Notes (Signed)
Physical Therapy Weekly Progress Note  Patient Details  Name: [name] MRN: [id number] Date of Birth: [birth year]  Beginning of progress report period: Aug 13, 2022 End of progress report period: Aug 19, 2022  Today's Date: 08/19/2022 PT Individual Time: 0803-0915 PT Individual Time Calculation (min): 72 min   Patient has met 1 of 4 short term goals and is progressing toward the remaining three STGs at this time. Patient continues to require hands-on/CGA for safety secondary to global deconditioning and poor safety awareness. Patient is scheduled for family training today with her sister in order to ensure a safe discharge home. LTGs downgraded to Supv in order to more accurately reflect patient's CLOF and the assistance she will receive at home from her sister.   Patient continues to demonstrate the following deficits muscle weakness, decreased cardiorespiratoy endurance, and decreased standing balance and decreased balance strategies and therefore will continue to benefit from skilled PT intervention to increase functional independence with mobility.  Patient not progressing toward long term goals.  See goal revision..  Plan of care revisions: Goals downgraded to supv in order to more accurately reflect patient's CLOF and the assistance she will be receiving upon discharge home.  PT Short Term Goals Week 1:  PT Short Term Goal 1 (Week 1): Pt will complete bed mobility with modI PT Short Term Goal 1 - Progress (Week 1): Met PT Short Term Goal 2 (Week 1): Pt will completed bed <> chair transfer with S + LRAD PT Short Term Goal 2 - Progress (Week 1): Progressing toward goal PT Short Term Goal 3 (Week 1): Pt will amb 122ft with S + LRAD PT Short Term Goal 3 - Progress (Week 1): Progressing toward goal PT Short Term Goal 4 (Week 1): Pt will navigate 5 steps with CGA + unilateral rail PT Short Term Goal 4 - Progress (Week 1): Progressing toward goal Week 2:  PT Short Term Goal 1 (Week 2):  STG=LTGs secondary to ELOS  Skilled Therapeutic Interventions/Progress Updates:  Patient greeted standing at the sink with RW and sister present and agreeable to PT treatment session. Sister, [name], present for hands-on family training in order to ensure a safe discharge home. Patient reported she had not received breakfast this morning and was waiting on her tray. Therapist, patient and [name] discussed CLOF, projected level of function upon discharge, DME needs, OP PT recommendations, safety modifications for in the home, encouraging a new lifestyle with activities in order to decrease amount of sedentary time, etc. All questions were answered throughout treatment session. [name] reported patient with wounds on B feet after remaining on the floor for 16 hours prior to coming into the hospital- Therapist performed a skin inspection of the lateral aspect of each foot with apparent wounds and no dressing. RN, Nelwyn Salisbury and Sharia Reeve, were notified and aware. Nursing came in and took pictures for the chart, as well as applied heel protectors and mepilex pads to the wounds. Patient and sister were provided with education while this was happening. Therapist re-scheduled a hands-on family training session with the sister next week (Tuesday, 6/4 at 1300) with CSW notified and aware. Prior to leaving the room therapist donned ted hose and shoes so patient would be ready for OT treatment session. Patient left supine in the bed with bed alarm on, call bell within reach, B LE elevated, sister present and all needs met.    Therapy Documentation Precautions:  Precautions Precautions: Fall Required Braces or Orthoses: Other Brace Other Brace: Darco  Left  - chronic wound Restrictions Weight Bearing Restrictions: No  Pain: No/Denies pain.   Therapy/Group: Individual Therapy  Lanard Arguijo 08/19/2022, 7:35 AM

## 2022-08-19 NOTE — Plan of Care (Signed)
  Problem: RH Balance Goal: LTG Patient will maintain dynamic standing with ADLs (OT) Description: LTG:  Patient will maintain dynamic standing balance with assist during activities of daily living (OT)  Flowsheets (Taken 08/19/2022 1124) LTG: Pt will maintain dynamic standing balance during ADLs with: Supervision/Verbal cueing Note: Goal downgraded 5/31-ESD   Problem: Sit to Stand Goal: LTG:  Patient will perform sit to stand in prep for activites of daily living with assistance level (OT) Description: LTG:  Patient will perform sit to stand in prep for activites of daily living with assistance level (OT) Flowsheets (Taken 08/19/2022 1124) LTG: PT will perform sit to stand in prep for activites of daily living with assistance level: Supervision/Verbal cueing Note: Goal downgraded 5/31-ESD   Problem: RH Bathing Goal: LTG Patient will bathe all body parts with assist levels (OT) Description: LTG: Patient will bathe all body parts with assist levels (OT) Flowsheets (Taken 08/19/2022 1124) LTG: Pt will perform bathing with assistance level/cueing: Supervision/Verbal cueing Note: Goal downgraded 5/31-ESD   Problem: RH Dressing Goal: LTG Patient will perform lower body dressing w/assist (OT) Description: LTG: Patient will perform lower body dressing with assist, with/without cues in positioning using equipment (OT) Flowsheets (Taken 08/19/2022 1124) LTG: Pt will perform lower body dressing with assistance level of: Supervision/Verbal cueing   Problem: RH Toileting Goal: LTG Patient will perform toileting task (3/3 steps) with assistance level (OT) Description: LTG: Patient will perform toileting task (3/3 steps) with assistance level (OT)  Flowsheets (Taken 08/19/2022 1124) LTG: Pt will perform toileting task (3/3 steps) with assistance level: Supervision/Verbal cueing Note: Goal downgraded 5/31-ESD   Problem: RH Simple Meal Prep Goal: LTG Patient will perform simple meal prep w/assist  (OT) Description: LTG: Patient will perform simple meal prep with assistance, with/without cues (OT). Flowsheets (Taken 08/19/2022 1124) LTG: Pt will perform simple meal prep with assistance level of: Supervision/Verbal cueing Note: Goal downgraded 5/31-ESD   Problem: RH Toilet Transfers Goal: LTG Patient will perform toilet transfers w/assist (OT) Description: LTG: Patient will perform toilet transfers with assist, with/without cues using equipment (OT) Flowsheets (Taken 08/19/2022 1124) LTG: Pt will perform toilet transfers with assistance level of: Supervision/Verbal cueing Note: Goal downgraded 5/31-ESD   Problem: RH Tub/Shower Transfers Goal: LTG Patient will perform tub/shower transfers w/assist (OT) Description: LTG: Patient will perform tub/shower transfers with assist, with/without cues using equipment (OT) Flowsheets (Taken 08/19/2022 1124) LTG: Pt will perform tub/shower stall transfers with assistance level of: Supervision/Verbal cueing Note: Goal downgraded 5/31-ESD   Problem: RH Attention Goal: LTG Patient will demonstrate this level of attention during functional activites (OT) Description: LTG:  Patient will demonstrate this level of attention during functional activites  (OT) Flowsheets (Taken 08/19/2022 1124) LTG: Patient will demonstrate this level of attention during functional activites (OT): Supervision Note: Goal downgraded 5/31-ESD

## 2022-08-19 NOTE — Progress Notes (Signed)
Occupational Therapy Session Note  Patient Details  Name: [name] MRN: [id number] Date of Birth: [birth year]  Today's Date: 08/19/2022 OT Individual Time: 4098-1191 OT Individual Time Calculation (min): 60 min    Short Term Goals: Week 2:  OT Short Term Goal 1 (Week 2): LTG=STG 2/2 ELOS  Skilled Therapeutic Interventions/Progress Updates:    Pt greeted semi-reclined in bed asleep. Pt easy to wake and agreeable to OT treatment session. Pt completed bed mobility with supervision. Donned shoes and L off--loading shoe with min A. Pt declined need to go to the bathroom. Pt ambulated to therapy gym w/ RW and CGA. Strength/endurance with NuStep on level 3 for 12 minutes. Pt then ambulated to therapy mat and completed 2 sets of 10 curl to press using 4 lb free weights. Standing alternating toe taps on small cones with CGA for balance. Pt took seated rest break, then ambulated to room in similar fashion. Pt declined need to go to the bathroom and returned to bed. Pt reported urgent need to go to the  bathroom after donning Prevalon boots in bed. Pt returned to sitting and OT assisted with shoes due to urgency. Pt ambulated into bathroom w/ RW and sat on commode to void with CGA. Handoff to nurse tech.  Therapy Documentation Precautions:  Precautions Precautions: Fall Required Braces or Orthoses: Other Brace Other Brace: Darco Left  - chronic wound Restrictions Weight Bearing Restrictions: No Pain:  Denies pain   Therapy/Group: Individual Therapy  [name] 08/19/2022, 2:35 PM

## 2022-08-19 NOTE — Progress Notes (Signed)
Occupational Therapy Weekly Progress Note  Patient Details  Name: [name] MRN: [id number] Date of Birth: Jan 18, 1951  Beginning of progress report period: Aug 13, 2022 End of progress report period: Aug 19, 2022  Today's Date: 08/19/2022 OT Individual Time: 8295-6213 OT Individual Time Calculation (min): 72 min    Patient has met 0 of 3 short term goals.  Patient is making slow but steady progress towards OT goals. Pt is currently at a CGA level for functional BADL tasks. Goals to be downgraded to supervision for safety. Patient continues to fatigue quickly and requires multiple rest breaks within BADL tasks.   Patient continues to demonstrate the following deficits: muscle weakness, decreased cardiorespiratoy endurance, impaired timing and sequencing, unbalanced muscle activation, decreased coordination, and decreased motor planning, and decreased sitting balance, decreased standing balance, decreased postural control, and decreased balance strategies and therefore will continue to benefit from skilled OT intervention to enhance overall performance with BADL, iADL, and Reduce care partner burden.  Patient not progressing toward long term goals.  See goal revision..  Plan of care revisions: Goals downgraded to supervision. .  OT Short Term Goals Week 1:  OT Short Term Goal 1 (Week 1): Patient will dress self with set up assist OT Short Term Goal 1 - Progress (Week 1): Progressing toward goal OT Short Term Goal 2 (Week 1): Patient will toilet self with supervision/ set up OT Short Term Goal 2 - Progress (Week 1): Progressing toward goal OT Short Term Goal 3 (Week 1): Patient will bathe self with set up assistance OT Short Term Goal 3 - Progress (Week 1): Progressing toward goal Week 2:  OT Short Term Goal 1 (Week 2): LTG=STG 2/2 ELOS  Skilled Therapeutic Interventions/Progress Updates:    Pt greeted semi-reclined in bed with sister prsentfor family education. Treatment session  focused on education to sister regarding Education provided regarding safe BADL performance in home environment, home modifications, DME needs, and safety awareness. Educated on donning TED hose using friction reducing bag, safety with functional ambulation using RW at CGA level, and tub bench transfer in simulated home environment. Pt needed intermittent rest breaks due to fatigue. Pt ambulated back to room with CGA and RW, then returned to bed with supervision. PT left semi-reclined in bed with bed alarm on, call bell in reach, and needs met.     Therapy Documentation Precautions:  Precautions Precautions: Fall Required Braces or Orthoses: Other Brace Other Brace: Darco Left  - chronic wound Restrictions Weight Bearing Restrictions: No Pain:  Denies pain  Therapy/Group: Individual Therapy  [name] 08/19/2022, 11:22 AM

## 2022-08-19 NOTE — Progress Notes (Signed)
PROGRESS NOTE   Subjective/Complaints:  No events overnight. Sister at bedside this AM, notes heel wounds were seen when patient was initially found down at home; had no update in the chart since admission despite multiple wound care evaluations and skin checks. Was seen by charge nursing this AM, photos placed in chart and prevalon boots placed as below.   BP elevated 187/63 this AM; others look stable. Cr remains 1.8-1.7.   Pt states she is tired from working yesterday, but no other acute complaints.   ROS:  + Pain -diffuse, aching -improved +stress incontinence -ongoing + Heel wounds - nonpainful give neuropathy - new Denies fevers, chills, N/V, abdominal pain, constipation, diarrhea, SOB, cough, chest pain, new weakness or paraesthesias.    Objective:   No results found. No results for input(s): "WBC", "HGB", "HCT", "PLT" in the last 72 hours.  Recent Labs    08/17/22 0552 08/18/22 1509 08/19/22 0023  NA 136 136  --   K 4.1 4.2  --   CL 102 104  --   CO2 25 25  --   GLUCOSE 186* 254*  --   BUN 46* 39*  --   CREATININE 1.69* 1.83* 1.77*  CALCIUM 8.8* 8.5*  --      Intake/Output Summary (Last 24 hours) at 08/19/2022 0854 Last data filed at 08/18/2022 1756 Stout per 24 hour  Intake 476 ml  Output --  Net 476 ml         Physical Exam: Vital Signs Blood pressure (!) 187/63, pulse (!) 56, temperature 97.9 F (36.6 C), resp. rate 18, height 5\' 9"  (1.753 m), weight 105.5 kg, SpO2 99 %.   General: No acute distress.  Laying in bed. +obese Heart: Regular rate and rhythm no rubs murmurs or extra sounds Lungs: Clear to auscultation, breathing unlabored, no rales or wheezes Abdomen: Positive bowel sounds, soft nontender to palpation, nondistended Extremities: No clubbing, cyanosis. + BL LE edema 3+, nonpitting-unchanged Skin:  + R MTP wound - chronic, stable, covered in mepilex + Buttocks skin tear - stable on  last assessment + Bilateral heel/hindfoot wounts, clean base, serous drainage; mild blanching erythema around R wound      Neurologic: AAOx4.  No apparent deficits. Appropriate mood and affect. Cranial nerves II through XII intact,  motor strength is 5/5 BL UE , Bilateral lower extremities 4 out of 5 - unchanged Sensory exam reduced sensation to light touch  lower extremities to the knees-unchanged Musculoskeletal: Full range of motion in all 4 extremities in bed.    Assessment/Plan: 1. Functional deficits which require 3+ hours per day of interdisciplinary therapy in a comprehensive inpatient rehab setting. Physiatrist is providing close team supervision and 24 hour management of active medical problems listed below. Physiatrist and rehab team continue to assess barriers to discharge/monitor patient progress toward functional and medical goals  Care Tool:  Bathing    Body parts bathed by patient: Right arm, Left arm, Abdomen, Chest, Front perineal area, Buttocks, Right upper leg, Left upper leg, Right lower leg, Left lower leg, Face         Bathing assist Assist Level: Contact Guard/Touching assist     Upper Body  Dressing/Undressing Upper body dressing   What is the patient wearing?: Pull over shirt    Upper body assist Assist Level: Set up assist    Lower Body Dressing/Undressing Lower body dressing      What is the patient wearing?: Underwear/pull up, Pants     Lower body assist Assist for lower body dressing: Contact Guard/Touching assist     Toileting Toileting    Toileting assist Assist for toileting: Minimal Assistance - Patient > 75% Assistive Device Comment:  (walker)   Transfers Chair/bed transfer  Transfers assist     Chair/bed transfer assist level: Minimal Assistance - Patient > 75%     Locomotion Ambulation   Ambulation assist      Assist level: Contact Guard/Touching assist Assistive device: Walker-rolling Max distance: 60    Walk 10 feet activity   Assist     Assist level: Contact Guard/Touching assist Assistive device: Walker-rolling   Walk 50 feet activity   Assist    Assist level: Contact Guard/Touching assist Assistive device: Walker-rolling    Walk 150 feet activity   Assist Walk 150 feet activity did not occur: Safety/medical concerns (2/2 fatigue)         Walk 10 feet on uneven surface  activity   Assist     Assist level: Minimal Assistance - Patient > 75% Assistive device: Walker-rolling   Wheelchair     Assist Is the patient using a wheelchair?: No             Wheelchair 50 feet with 2 turns activity    Assist            Wheelchair 150 feet activity     Assist          Blood pressure (!) 187/63, pulse (!) 56, temperature 97.9 F (36.6 C), resp. rate 18, height 5\' 9"  (1.753 m), weight 105.5 kg, SpO2 99 %.  Medical Problem List and Plan: 1. Functional deficits secondary to right MCA territory infarcts on 08/08/2022 and left subcortical lacunar infarct.  Recommendations for 30-day cardiac event monitor             -patient may shower             -ELOS/Goals: 10-14 days, SPV PT/OT/SLP; DC date 6/6  - rec therapy consult per pt request 5/28   - 5/28: CGA OT currently, PT states very debilitated, very poor standing tolerance. She is incontinent when standing but resists wearing brief; SPV goals.    - Family Ed monday  2.  Antithrombotics: -DVT/anticoagulation:  Pharmaceutical: Lovenox             -antiplatelet therapy: Aspirin 81 mg daily and Plavix 75 mg daily (6/10)x 3 weeks then aspirin alone 3. Pain Management: Oxycodone as needed 4. Mood/Behavior/Sleep: Lexapro 20 mg daily, Wellbutrin 300 mg daily             -antipsychotic agents: N/A 5. Neuropsych/cognition: This patient is capable of making decisions on her own behalf. 6. Skin/Wound Care: Skin care as advised to left foot wound.  Routine skin checks..  Follow-up outpatient podiatry              - Mild sacral wound, small tear with minimal bleeding; cover and ensure no soiling - WOCN saw 5/26  - L 1st MTP wound: Left foot wound is to be cleansed daily and dressed with bacitracin ointment and silicone foam dressing. Confirmed orders in chart, appropriate 5/31.   - Bilateral heel wounds, noted 5/31; Sister  mentions they were present prior to admission, however not documented on inpatient, or in WOCN assessments 5/23 and 5/25. Foam dressings changed PRN or daily, PREVALON boots ordered. Will discuss w/ nursing intake skin assessment findings.    7. Fluids/Electrolytes/Nutrition: Routine in and outs with follow-up chemistries 8.  E. coli UTI.  Intravenous Rocephin 08/09/2022 9.  Hypertension.  Cozaar 50 mg daily, Coreg 6.25 mg twice daily.  Monitor with increased mobility  - 5/27: BP mildly elevated, monitor with activity this week and consider increase in Cozaar  - 5/29: BP improved this AM; monitor  - 5/30: BP mildly elevated this a.m., heart rate low/stable.  Trend, if additional agent needed will consider Norvasc 5 mg.  - 5/31: Elevated BP. Add Norvasc 5 mg daily, Reduce Cozaar to 25 mg daily d/t ongoing elevated Cr. Add PRN hydralazine 10 mg for SBP >170.   Vitals:   08/18/22 2036 08/19/22 0408  BP: (!) 159/50 (!) 187/63  Pulse: 66 (!) 56  Resp: 18 18  Temp: 98.2 F (36.8 C) 97.9 F (36.6 C)  SpO2: 98% 99%    10.  Hyperlipidemia.  Lipitor 11.  Diabetes mellitus with peripheral neuropathy.  Hemoglobin A1c 7.0.  Currently on SSI and Semglee 15 units nightly.  Diabetic teaching.    - 5/27: Poorly controlled, increase Semglee to 18 U  - 5/28: Increase Semglee to 22 U - improved; monitor on current regimen  - 5/30: If BMP stable today, will restart metformin 1000 mg twice daily in a.m.  - AKI ongoing, hold  Recent Labs    08/18/22 1641 08/18/22 2038 08/19/22 0643  GLUCAP 216* 136* 133*        12.  Obesity.  BMI 34.21.  Dietary follow-up 13.  AKI on CKD stage III.   Most recent creatinine 1.42 earlier this month.  Follow-up chemistries  - 5/27: BUN/Cr uptrending to 37/1.64 this AM; encourage fluids, repeat in 2 days  - 5/29: BUN/Cr increased; add 1 L IVF LR 100/hr today, recheck in AM  - Cr 1.8 5/30 -> 1.7 1/31; reduced Cozaar as above, Encouraged PO fluids, recheck Monday  14.  History of ductal carcinoma in situ of left breast.  Status postlumpectomy radioactive seed 2021.  Will discuss resuming Nolvadex.  Follow-up Dr. Pamelia Hoit 15.Right eighth rib fracture from fall.Conservative care  - Adding lidocaine patches to R chest  16.  Acute anemia.  Follow-up CBC.    17. Edema. TEDs (can cut out toes for wounds) and elevate legs in bed.  Given creatinine at baseline, consider addition of diuretic for edema if worsening. - hold off until Cr stabilizes  18.  Urge/Stress incontinence. PVRS x3 days. Encourage double voiding. Cannot use 5-alpha reductase inhibitors d/t sulfa allergy. Will hold off and monitor with above measures, consider Ditropan.  - 5/31: PVRs remain low, no incontinence documented; has not been double voiding.  No medication intervention for now, encouraged double voiding.  - LBM 5/31, having consistent Bms      LOS: 7 days A FACE TO FACE EVALUATION WAS PERFORMED  [name] 08/19/2022, 8:54 AM

## 2022-08-19 NOTE — Plan of Care (Signed)
Goals downgraded in order to more accurately reflect the patient's CLOF and the assistance she will receive at home upon discharge- SR 5/31.    Problem: RH Balance Goal: LTG Patient will maintain dynamic standing balance (PT) Description: LTG:  Patient will maintain dynamic standing balance with assistance during mobility activities (PT) Flowsheets (Taken 08/19/2022 0739) LTG: Pt will maintain dynamic standing balance during mobility activities with:: (SR 5/31) Supervision/Verbal cueing Note: Goal downgraded in order to more accurately reflect patient's CLOF and the assistance she will receive at home- SR 5/31   Problem: RH Bed to Chair Transfers Goal: LTG Patient will perform bed/chair transfers w/assist (PT) Description: LTG: Patient will perform bed to chair transfers with assistance (PT). Flowsheets (Taken 08/19/2022 0739) LTG: Pt will perform Bed to Chair Transfers with assistance level: (SR 5/31) Supervision/Verbal cueing Note: Goal downgraded in order to more accurately reflect patient's CLOF and the assistance she will receive at home- SR 5/31   Problem: RH Car Transfers Goal: LTG Patient will perform car transfers with assist (PT) Description: LTG: Patient will perform car transfers with assistance (PT). Flowsheets (Taken 08/19/2022 0739) LTG: Pt will perform car transfers with assist:: (SR 5/31) Supervision/Verbal cueing Note: Goal downgraded in order to more accurately reflect patient's CLOF and the assistance she will receive at home- SR 5/31   Problem: RH Ambulation Goal: LTG Patient will ambulate in home environment (PT) Description: LTG: Patient will ambulate in home environment, # of feet with assistance (PT). Flowsheets (Taken 08/19/2022 0739) LTG: Pt will ambulate in home environ  assist needed:: (SR 5/31) Supervision/Verbal cueing Note: Goal downgraded in order to more accurately reflect patient's CLOF and the assistance she will receive at home- SR 5/31

## 2022-08-20 DIAGNOSIS — E1165 Type 2 diabetes mellitus with hyperglycemia: Secondary | ICD-10-CM

## 2022-08-20 DIAGNOSIS — L97409 Non-pressure chronic ulcer of unspecified heel and midfoot with unspecified severity: Secondary | ICD-10-CM

## 2022-08-20 LAB — GLUCOSE, CAPILLARY
Glucose-Capillary: 128 mg/dL — ABNORMAL HIGH (ref 70–99)
Glucose-Capillary: 226 mg/dL — ABNORMAL HIGH (ref 70–99)
Glucose-Capillary: 174 mg/dL — ABNORMAL HIGH (ref 70–99)
Glucose-Capillary: 240 mg/dL — ABNORMAL HIGH (ref 70–99)

## 2022-08-20 MED ORDER — INSULIN GLARGINE-YFGN 100 UNIT/ML ~~LOC~~ SOLN
5.0000 [IU] | Freq: Every day | SUBCUTANEOUS | Status: DC
  Administered 2022-08-20 – 2022-08-25 (×6): 5 [IU] via SUBCUTANEOUS
  Filled 2022-08-20 (×7): qty 0.05

## 2022-08-20 NOTE — Progress Notes (Signed)
PROGRESS NOTE   Subjective/Complaints:  Pt is in bed. Reports that her wounds are "bleeding". Nurse reported that she seemed more confused last night  ROS: Patient denies fever, rash, sore throat, blurred vision, dizziness, nausea, vomiting, diarrhea, cough, shortness of breath or chest pain, joint or back/neck pain, headache, or mood change.   Objective:   No results found. No results for input(s): "WBC", "HGB", "HCT", "PLT" in the last 72 hours.  Recent Labs    08/18/22 1509 08/19/22 0023  NA 136  --   K 4.2  --   CL 104  --   CO2 25  --   GLUCOSE 254*  --   BUN 39*  --   CREATININE 1.83* 1.77*  CALCIUM 8.5*  --     Intake/Output Summary (Last 24 hours) at 08/20/2022 0903 Last data filed at 08/19/2022 1454 Ells per 24 hour  Intake 120 ml  Output --  Net 120 ml        Physical Exam: Vital Signs Blood pressure (!) 162/49, pulse (!) 58, temperature 98.1 F (36.7 C), resp. rate 16, height 5\' 9"  (1.753 m), weight 105.5 kg, SpO2 100 %.   Constitutional: No distress . Vital signs reviewed. HEENT: NCAT, EOMI, oral membranes moist Neck: supple Cardiovascular: RRR without murmur. No JVD    Respiratory/Chest: CTA Bilaterally without wheezes or rales. Normal effort    GI/Abdomen: BS +, non-tender, non-distended Ext: no clubbing, cyanosis,b/l 2+ edema Psych: flat but cooperative  Skin:  + R MTP wound - chronic, stable, covered in mepilex + Buttocks skin tear - stable on last assessment + Bilateral heel/hindfoot wounts, clean base, serous drainage; mild blanching erythema around R wound    6/1--no changes  Neurologic: AAOx3.  No apparent deficits. Cranial nerves II through XII intact,  motor strength is 5/5 BL UE , Bilateral lower extremities 4 out of 5 - unchanged Sensory exam reduced sensation to light touch  lower extremities to the knees-unchanged Musculoskeletal: Full range of motion in all 4 extremities  in bed.    Assessment/Plan: 1. Functional deficits which require 3+ hours per day of interdisciplinary therapy in a comprehensive inpatient rehab setting. Physiatrist is providing close team supervision and 24 hour management of active medical problems listed below. Physiatrist and rehab team continue to assess barriers to discharge/monitor patient progress toward functional and medical goals  Care Tool:  Bathing    Body parts bathed by patient: Right arm, Left arm, Abdomen, Chest, Front perineal area, Buttocks, Right upper leg, Left upper leg, Right lower leg, Left lower leg, Face         Bathing assist Assist Level: Contact Guard/Touching assist     Upper Body Dressing/Undressing Upper body dressing   What is the patient wearing?: Pull over shirt    Upper body assist Assist Level: Set up assist    Lower Body Dressing/Undressing Lower body dressing      What is the patient wearing?: Underwear/pull up, Pants     Lower body assist Assist for lower body dressing: Contact Guard/Touching assist     Toileting Toileting    Toileting assist Assist for toileting: Minimal Assistance - Patient > 75% Assistive Device  Comment:  (walker)   Transfers Chair/bed transfer  Transfers assist     Chair/bed transfer assist level: Minimal Assistance - Patient > 75%     Locomotion Ambulation   Ambulation assist      Assist level: Contact Guard/Touching assist Assistive device: Walker-rolling Max distance: 60   Walk 10 feet activity   Assist     Assist level: Contact Guard/Touching assist Assistive device: Walker-rolling   Walk 50 feet activity   Assist    Assist level: Contact Guard/Touching assist Assistive device: Walker-rolling    Walk 150 feet activity   Assist Walk 150 feet activity did not occur: Safety/medical concerns (2/2 fatigue)         Walk 10 feet on uneven surface  activity   Assist     Assist level: Minimal Assistance - Patient  > 75% Assistive device: Walker-rolling   Wheelchair     Assist Is the patient using a wheelchair?: No             Wheelchair 50 feet with 2 turns activity    Assist            Wheelchair 150 feet activity     Assist          Blood pressure (!) 162/49, pulse (!) 58, temperature 98.1 F (36.7 C), resp. rate 16, height 5\' 9"  (1.753 m), weight 105.5 kg, SpO2 100 %.  Medical Problem List and Plan: 1. Functional deficits secondary to right MCA territory infarcts on 08/08/2022 and left subcortical lacunar infarct.  Recommendations for 30-day cardiac event monitor             -patient may shower             -ELOS/Goals: 10-14 days, SPV PT/OT/SLP; DC date 6/6  - rec therapy consult per pt request 5/28   - Family Ed monday  -cognition seems at baseline today? 2.  Antithrombotics: -DVT/anticoagulation:  Pharmaceutical: Lovenox             -antiplatelet therapy: Aspirin 81 mg daily and Plavix 75 mg daily (6/10)x 3 weeks then aspirin alone 3. Pain Management: Oxycodone as needed 4. Mood/Behavior/Sleep: Lexapro 20 mg daily, Wellbutrin 300 mg daily             -antipsychotic agents: N/A 5. Neuropsych/cognition: This patient is capable of making decisions on her own behalf. 6. Skin/Wound Care: Skin care as advised to left foot wound.  Routine skin checks..  Follow-up outpatient podiatry             - Mild sacral wound, small tear with minimal bleeding; cover and ensure no soiling - WOCN saw 5/26  - L 1st MTP wound: Left foot wound is to be cleansed daily and dressed with bacitracin ointment and silicone foam dressing. Confirmed orders in chart, appropriate 5/31.   - Bilateral heel wounds, noted 5/31; Sister mentions they were present prior to admission, however not documented on inpatient, or in WOCN assessments 5/23 and 5/25. Foam dressings changed PRN or daily, PREVALON boots ordered. Will discuss w/ nursing intake skin assessment findings.    6/1 needs to float heels or  wear PREVALON boots 7. Fluids/Electrolytes/Nutrition: Routine in and outs with follow-up chemistries 8.  E. coli UTI.  Intravenous Rocephin 08/09/2022 9.  Hypertension.  Cozaar 50 mg daily, Coreg 6.25 mg twice daily.  Monitor with increased mobility  - 5/27: BP mildly elevated, monitor with activity this week and consider increase in Cozaar  - 5/29: BP improved this  AM; monitor  - 5/30: BP mildly elevated this a.m., heart rate low/stable.  Trend, if additional agent needed will consider Norvasc 5 mg.  - 5/31: Elevated BP. Add Norvasc 5 mg daily, Reduce Cozaar to 25 mg daily d/t ongoing elevated Cr. Add PRN hydralazine 10 mg for SBP >170.   6/1 improved control today---obsv with recent changes Vitals:   08/20/22 0009 08/20/22 0609  BP: (!) 136/51 (!) 162/49  Pulse: 70 (!) 58  Resp: 16 16  Temp: 99.7 F (37.6 C) 98.1 F (36.7 C)  SpO2: 98% 100%     10.  Hyperlipidemia.  Lipitor 11.  Diabetes mellitus with peripheral neuropathy.  Hemoglobin A1c 7.0.  Currently on SSI and Semglee 15 units nightly.  Diabetic teaching.    - 5/27: Poorly controlled, increase Semglee to 18 U  - 5/28: Increase Semglee to 22 U - improved; monitor on current regimen  - 5/30: If BMP stable today, will restart metformin 1000 mg twice daily in a.m.  - AKI ongoing, hold   6/1 add am semglee 5u to better cover PM CBG's Recent Labs    08/19/22 1655 08/19/22 2124 08/20/22 0622  GLUCAP 158* 280* 128*       12.  Obesity.  BMI 34.21.  Dietary follow-up 13.  AKI on CKD stage III.  Most recent creatinine 1.42 earlier this month.  Follow-up chemistries  - 5/27: BUN/Cr uptrending to 37/1.64 this AM; encourage fluids, repeat in 2 days  - 5/29: BUN/Cr increased; add 1 L IVF LR 100/hr today, recheck in AM  - Cr 1.8 5/30 -> 1.7 1/31; reduced Cozaar as above, Encouraged PO fluids, recheck Monday  14.  History of ductal carcinoma in situ of left breast.  Status postlumpectomy radioactive seed 2021.  Will discuss resuming  Nolvadex.  Follow-up Dr. Pamelia Hoit 15.Right eighth rib fracture from fall.Conservative care  - Adding lidocaine patches to R chest  16.  Acute anemia.  Follow-up CBC.    17. Edema. TEDs (can cut out toes for wounds) and elevate legs in bed.  Given creatinine at baseline, consider addition of diuretic for edema if worsening. - hold off until Cr stabilizes  18.  Urge/Stress incontinence. PVRS x3 days. Encourage double voiding. Cannot use 5-alpha reductase inhibitors d/t sulfa allergy. Will hold off and monitor with above measures, consider Ditropan.  - 5/31: PVRs remain low, no incontinence documented; has not been double voiding.  No medication intervention for now, encouraged double voiding.  - LBM 5/31, having consistent Bms      LOS: 8 days A FACE TO FACE EVALUATION WAS PERFORMED  Ranelle Oyster 08/20/2022, 9:03 AM

## 2022-08-20 NOTE — Progress Notes (Signed)
Occupational Therapy Session Note  Patient Details  Name: [name] MRN: [id number] Date of Birth: [birth year]  Today's Date: 08/20/2022 OT Individual Time: 1400-1500 OT Individual Time Calculation (min): 60 min   Short Term Goals: Week 2:  OT Short Term Goal 1 (Week 2): LTG=STG 2/2 ELOS  Skilled Therapeutic Interventions/Progress Updates:    Pt greeted semi-reclined in bed asleep, easy to wake and agreeable to OT treatment session. OT able to talk pt into showering today as she has not bathed in a few days. Pt ambulated to bathroom w. RW and CGA. She ambulated to shower seat w/ RW, CGA and cues for using grab bar. B LE dressing left on for shower, then nursing to change dressings after shower. OT also issued LH sponge to improve reach to lower body when washing. Sit<>stand in shower with CGA while washing buttocks.  Pt completed dressing tasks seated in recilner with increased time and min A. Pt a little lethargic throughout session and needed increased time for all tasks. Pt left seated in recliner at end of session with alarm belt on, call bell in reach, and needs met.   Therapy Documentation Precautions:  Precautions Precautions: Fall Required Braces or Orthoses: Other Brace Other Brace: Darco Left  - chronic wound Restrictions Weight Bearing Restrictions: No Pain: Denies pain   Therapy/Group: Individual Therapy  [name] 08/20/2022, 1:38 PM

## 2022-08-21 LAB — GLUCOSE, CAPILLARY
Glucose-Capillary: 153 mg/dL — ABNORMAL HIGH (ref 70–99)
Glucose-Capillary: 210 mg/dL — ABNORMAL HIGH (ref 70–99)
Glucose-Capillary: 183 mg/dL — ABNORMAL HIGH (ref 70–99)
Glucose-Capillary: 225 mg/dL — ABNORMAL HIGH (ref 70–99)

## 2022-08-21 NOTE — Discharge Summary (Signed)
Physician Discharge Summary  [name] MRN: [id number] DOB/AGE: 71-Jun-1952 71 y.o.  Admit date: 08/12/2022 Discharge date: 08/25/2022  Discharge Diagnoses:  Principal Problem:   Right middle cerebral artery stroke [name]) DVT prophylaxis Pain management Hypertension E. coli UTI Hyperlipidemia Diabetes mellitus with peripheral neuropathy Obesity AKI on CKD stage III History of ductal carcinoma in situ of left breast Right eighth rib fracture from recent fall Chronic left foot wound Bilateral heel wounds  Discharged Condition: Stable  Significant Diagnostic Studies: VAS US CAROTID  Result Date: 08/09/2022 Carotid Arterial Duplex Study Patient Name:  [name]  Date of Exam:   08/09/2022 Medical Rec #: [id number]        Accession #:    [id number] Date of Birth: December 30, 1950        Patient Gender: F Patient Age:   7 years Exam Location:  [facility] Procedure:      VAS US CAROTID Referring Phys: [name] --------------------------------------------------------------------------------  Indications:       CVA and Weakness. Risk Factors:      Hypertension, hyperlipidemia, Diabetes. Comparison Study:  No previous study. Performing Technologist: [name] RVT, [place]  Examination Guidelines: A complete evaluation includes B-mode imaging, spectral Doppler, color Doppler, and power Doppler as needed of all accessible portions of each vessel. Bilateral testing is considered an integral part of a complete examination. Limited examinations for reoccurring indications may be performed as noted.  Right Carotid Findings: +----------+--------+--------+--------+---------------------+------------------+           PSV cm/sEDV cm/sStenosisPlaque Description   Comments           +----------+--------+--------+--------+---------------------+------------------+ CCA Prox  78      9                                                        +----------+--------+--------+--------+---------------------+------------------+ CCA Distal64      7                                                       +----------+--------+--------+--------+---------------------+------------------+ ICA Prox  106     17      1-39%   homogeneous,         intimal thickening                                   irregular and                                                             calcific                                +----------+--------+--------+--------+---------------------+------------------+ ICA Mid   97      19                                                      +----------+--------+--------+--------+---------------------+------------------+  ICA Distal127     21                                                      +----------+--------+--------+--------+---------------------+------------------+ ECA       193     19                                                      +----------+--------+--------+--------+---------------------+------------------+ +----------+--------+-------+--------+-------------------+           PSV cm/sEDV cmsDescribeArm Pressure (mmHG) +----------+--------+-------+--------+-------------------+ [id number]            Stenotic                    +----------+--------+-------+--------+-------------------+ +---------+--------+--+--------+-+---------+ VertebralPSV cm/s32EDV cm/s7Antegrade +---------+--------+--+--------+-+---------+  Left Carotid Findings: +----------+--------+--------+--------+---------------------+------------------+           PSV cm/sEDV cm/sStenosisPlaque Description   Comments           +----------+--------+--------+--------+---------------------+------------------+ CCA Prox  107     9                                                       +----------+--------+--------+--------+---------------------+------------------+ CCA Distal69      13                                    intimal thickening +----------+--------+--------+--------+---------------------+------------------+ ICA Prox  129     18      40-59%  homogeneous,                                                              irregular and                                                             calcific                                +----------+--------+--------+--------+---------------------+------------------+ ICA Mid   120     13                                                      +----------+--------+--------+--------+---------------------+------------------+ ICA Distal114     22                                                      +----------+--------+--------+--------+---------------------+------------------+  ECA       107                                                             +----------+--------+--------+--------+---------------------+------------------+ +----------+--------+--------+----------------+-------------------+           PSV cm/sEDV cm/sDescribe        Arm Pressure (mmHG) +----------+--------+--------+----------------+-------------------+ [id number]             Multiphasic, WNL                    +----------+--------+--------+----------------+-------------------+ +---------+--------+--+--------+-+---------+ VertebralPSV cm/s49EDV cm/s9Antegrade +---------+--------+--+--------+-+---------+   Summary: Right Carotid: Velocities in the right ICA are consistent with a 1-39% stenosis. Left Carotid: Velocities in the left ICA are consistent with a 40-59% stenosis. Vertebrals:  Bilateral vertebral arteries demonstrate antegrade flow. Subclavians: Right subclavian artery was stenotic. Normal flow hemodynamics were              seen in the left subclavian artery. *See table(s) above for measurements and observations.  Electronically signed by [name] on 08/09/2022 at 7:15:45 PM.    Final    ECHOCARDIOGRAM COMPLETE  BUBBLE STUDY  Result Date: 08/09/2022    ECHOCARDIOGRAM REPORT   Patient Name:   [name] Date of Exam: 08/09/2022 Medical Rec #:  [id number]       Height:       69.5 in Accession #:    [id number]      Weight:       235.0 lb Date of Birth:  08/20/1950       BSA:          2.224 m Patient Age:    72 years        BP:           182/55 mmHg Patient Gender: F               HR:           80 bpm. Exam Location:  Inpatient Procedure: 2D Echo, Cardiac Doppler, Color Doppler and Saline Contrast Bubble            Study Indications:    stroke  History:        Patient has no prior history of Echocardiogram examinations.                 Stroke; Risk Factors:Hypertension, Diabetes and Dyslipidemia.  Sonographer:    Mike Gip Referring Phys: [id number] CAROLE N HALL IMPRESSIONS  1. Left ventricular ejection fraction, by estimation, is 60 to 65%. The left ventricle has normal function. The left ventricle has no regional wall motion abnormalities. There is mild concentric left ventricular hypertrophy. Left ventricular diastolic parameters are consistent with Grade I diastolic dysfunction (impaired relaxation).  2. Right ventricular systolic function is normal. The right ventricular size is normal. Tricuspid regurgitation signal is inadequate for assessing PA pressure.  3. Left atrial size was mild to moderately dilated.  4. The mitral valve is normal in structure. No evidence of mitral valve regurgitation. No evidence of mitral stenosis.  5. The aortic valve is tricuspid. There is mild thickening of the aortic valve. Aortic valve regurgitation is not visualized. Aortic valve sclerosis is present, with no evidence of aortic valve stenosis.  6. The inferior vena cava is normal in  size with greater than 50% respiratory variability, suggesting right atrial pressure of 3 mmHg.  7. Agitated saline contrast bubble study was negative, with no evidence of any interatrial shunt. FINDINGS  Left Ventricle: Left ventricular ejection  fraction, by estimation, is 60 to 65%. The left ventricle has normal function. The left ventricle has no regional wall motion abnormalities. The left ventricular internal cavity size was normal in size. There is  mild concentric left ventricular hypertrophy. Left ventricular diastolic parameters are consistent with Grade I diastolic dysfunction (impaired relaxation). Indeterminate filling pressures. Right Ventricle: The right ventricular size is normal. No increase in right ventricular wall thickness. Right ventricular systolic function is normal. Tricuspid regurgitation signal is inadequate for assessing PA pressure. The tricuspid regurgitant velocity is 2.58 m/s, and with an assumed right atrial pressure of 3 mmHg, the estimated right ventricular systolic pressure is 29.6 mmHg. Left Atrium: Left atrial size was mild to moderately dilated. Right Atrium: Right atrial size was normal in size. Pericardium: There is no evidence of pericardial effusion. Mitral Valve: The mitral valve is normal in structure. Mild mitral annular calcification. No evidence of mitral valve regurgitation. No evidence of mitral valve stenosis. Tricuspid Valve: The tricuspid valve is normal in structure. Tricuspid valve regurgitation is trivial. No evidence of tricuspid stenosis. Aortic Valve: The aortic valve is tricuspid. There is mild thickening of the aortic valve. Aortic valve regurgitation is not visualized. Aortic valve sclerosis is present, with no evidence of aortic valve stenosis. Pulmonic Valve: The pulmonic valve was grossly normal. Pulmonic valve regurgitation is not visualized. No evidence of pulmonic stenosis. Aorta: The aortic root and ascending aorta are structurally normal, with no evidence of dilitation. Venous: The inferior vena cava is normal in size with greater than 50% respiratory variability, suggesting right atrial pressure of 3 mmHg. IAS/Shunts: No atrial level shunt detected by color flow Doppler. Agitated saline  contrast was given intravenously to evaluate for intracardiac shunting. Agitated saline contrast bubble study was negative, with no evidence of any interatrial shunt.  LEFT VENTRICLE PLAX 2D LVIDd:         4.40 cm      Diastology LVIDs:         3.10 cm      LV e' medial:    6.20 cm/s LV PW:         1.30 cm      LV E/e' medial:  12.5 LV IVS:        1.30 cm      LV e' lateral:   7.72 cm/s LVOT diam:     2.10 cm      LV E/e' lateral: 10.1 LV SV:         99 LV SV Index:   44 LVOT Area:     3.46 cm  LV Volumes (MOD) LV vol d, MOD A2C: 120.0 ml LV vol d, MOD A4C: 126.0 ml LV vol s, MOD A2C: 51.4 ml LV vol s, MOD A4C: 48.0 ml LV SV MOD A2C:     68.6 ml LV SV MOD A4C:     126.0 ml LV SV MOD BP:      73.9 ml RIGHT VENTRICLE             IVC RV Basal diam:  3.70 cm     IVC diam: 2.00 cm RV S prime:     22.80 cm/s TAPSE (M-mode): 3.1 cm LEFT ATRIUM             Index  RIGHT ATRIUM           Index LA diam:        4.40 cm 1.98 cm/m   RA Area:     17.30 cm LA Vol (A2C):   75.7 ml 34.04 ml/m  RA Volume:   43.90 ml  19.74 ml/m LA Vol (A4C):   68.0 ml 30.58 ml/m LA Biplane Vol: 74.0 ml 33.28 ml/m  AORTIC VALVE LVOT Vmax:   112.00 cm/s LVOT Vmean:  78.900 cm/s LVOT VTI:    0.285 m  AORTA Ao Root diam: 3.20 cm Ao Asc diam:  3.50 cm MITRAL VALVE                TRICUSPID VALVE MV Area (PHT): 2.56 cm     TR Peak grad:   26.6 mmHg MV E velocity: 77.80 cm/s   TR Vmax:        258.00 cm/s MV A velocity: 117.00 cm/s MV E/A ratio:  0.66         SHUNTS                             Systemic VTI:  0.29 m                             Systemic Diam: 2.10 cm Rachelle Hora Croitoru [name] Electronically signed by Thurmon Fair [name] Signature Date/Time: 08/09/2022/1:54:07 PM    Final    MR ANGIO HEAD WO CONTRAST  Result Date: 08/09/2022 CLINICAL DATA:  Stroke follow-up EXAM: MRA HEAD WITHOUT CONTRAST TECHNIQUE: Angiographic images of the Circle of Willis were acquired using MRA technique without intravenous contrast. COMPARISON:  None Available.  FINDINGS: POSTERIOR CIRCULATION: --Vertebral arteries: Normal --Inferior cerebellar arteries: Normal. --Basilar artery: Normal. --Superior cerebellar arteries: Normal. --Posterior cerebral arteries: Normal. ANTERIOR CIRCULATION: --Intracranial internal carotid arteries: Normal. --Anterior cerebral arteries (ACA): Normal. --Middle cerebral arteries (MCA): Normal. Anatomic variants: None Other: None. IMPRESSION: Normal intracranial MRA. Electronically Signed   By: Deatra Robinson M.D.   On: 08/09/2022 01:06   MR BRAIN WO CONTRAST  Result Date: 08/08/2022 CLINICAL DATA:  Altered mental status EXAM: MRI HEAD WITHOUT CONTRAST TECHNIQUE: Multiplanar, multiecho pulse sequences of the brain and surrounding structures were obtained without intravenous contrast. COMPARISON:  06/05/2019 FINDINGS: Brain: Multifocal bilateral acute ischemia, predominantly affecting the deep gray matter. There also 2 cortical foci within the right MCA territory. There are old bilateral MCA territory infarcts. No acute or chronic hemorrhage. There is multifocal hyperintense T2-weighted signal within the white matter. Generalized volume loss. The midline structures are normal. Vascular: Major flow voids are preserved. Skull and upper cervical spine: Normal calvarium and skull base. Visualized upper cervical spine and soft tissues are normal. Sinuses/Orbits:No paranasal sinus fluid levels or advanced mucosal thickening. No mastoid or middle ear effusion. Normal orbits. IMPRESSION: 1. Multifocal bilateral acute ischemia, predominantly affecting the deep gray matter. There also 2 cortical foci within the right MCA territory. No hemorrhage or mass effect. 2. Old bilateral MCA territory infarcts. 3. Mild volume loss and chronic ischemic microangiopathy. Electronically Signed   By: Deatra Robinson M.D.   On: 08/08/2022 21:34   DG Ribs Unilateral W/Chest Right  Result Date: 08/08/2022 CLINICAL DATA:  De [place]ings[place] 3 times in the past week with right lower  anterior rib pain EXAM: RIGHT RIBS AND CHEST - 3+ VIEW COMPARISON:  06/04/2010 FINDINGS: Suspected acute fracture of the anterior right eighth  rib. There is no evidence of pneumothorax or pleural effusion. Both lungs are clear. Stable cardiomediastinal silhouette. Aortic atherosclerotic calcification. Cervical spine fusion hardware IMPRESSION: Suspected acute fracture of the anterior right eighth rib. Electronically Signed   By: Minerva Fester M.D.   On: 08/08/2022 20:14   CT Head Wo Contrast  Result Date: 08/08/2022 CLINICAL DATA:  Provided history: Polytrauma, blunt. Additional history provided: Fall. Patient found down. EXAM: CT HEAD WITHOUT CONTRAST CT CERVICAL SPINE WITHOUT CONTRAST TECHNIQUE: Multidetector CT imaging of the head and cervical spine was performed following the standard protocol without intravenous contrast. Multiplanar CT image reconstructions of the cervical spine were also generated. RADIATION DOSE REDUCTION: This exam was performed according to the departmental dose-optimization program which includes automated exposure control, adjustment of the mA and/or kV according to patient size and/or use of iterative reconstruction technique. COMPARISON:  Brain MRI 06/05/2019.  Head CT 05/14/2019. FINDINGS: CT HEAD FINDINGS Brain: Generalized cerebral atrophy. Redemonstrated chronic cortically-based infarcts within the bilateral frontal lobes and left parietal lobe. 16 x 5 mm age-indeterminate infarct centered within the left corona radiata and caudate nucleus, new from the prior brain MRI of 06/05/2019 but otherwise age indeterminate. Background mild patchy and ill-defined hypoattenuation within the cerebral white matter, nonspecific but compatible with chronic small ischemic disease. There is no acute intracranial hemorrhage. No extra-axial fluid collection. No evidence of an intracranial mass. No midline shift. Vascular: No hyperdense vessel.  Atherosclerotic calcifications. Skull: No fracture  or aggressive osseous lesion. Sinuses/Orbits: No mass or acute finding within the imaged orbits. No significant paranasal sinus disease. CT CERVICAL SPINE FINDINGS Alignment: No significant spondylolisthesis. Skull base and vertebrae: The basion-dental and atlanto-dental intervals are maintained.No evidence of acute fracture to the cervical spine. Soft tissues and spinal canal: No prevertebral fluid or swelling. No visible canal hematoma. Disc levels: Prior C5-C6 ACDF. Solid arthrodesis across the C5-C6 disc space. No evidence of acute hardware compromise. Background cervical spondylosis with multilevel disc space narrowing and disc bulges/central disc protrusions. Disc space narrowing is greatest at C4-C5 (moderately advanced at this level). No appreciable high-grade spinal canal stenosis. No significant bony neural foraminal narrowing. Upper chest: No consolidation within the imaged lung apices. No visible pneumothorax. IMPRESSION: CT head: 1. 16 x 5 mm infarct within the left corona radiata/caudate nucleus, new from the prior brain MRI of 06/05/2019 but otherwise age indeterminate. Consider a brain MRI for further evaluation. 2. No acute posttraumatic intracranial findings. 3. Redemonstrated chronic cortically-based infarcts within bilateral frontal and left parietal lobes. 4. Background mild cerebral white matter chronic small vessel ischemic disease. 5. Generalized cerebral atrophy. CT cervical spine: 1. No evidence of acute fracture to the cervical spine. 2. Prior C5-C6 ACDF. 3. Cervical spondylosis as described. Electronically Signed   By: Jackey Loge D.O.   On: 08/08/2022 14:05   CT Cervical Spine Wo Contrast  Result Date: 08/08/2022 CLINICAL DATA:  Provided history: Polytrauma, blunt. Additional history provided: Fall. Patient found down. EXAM: CT HEAD WITHOUT CONTRAST CT CERVICAL SPINE WITHOUT CONTRAST TECHNIQUE: Multidetector CT imaging of the head and cervical spine was performed following the  standard protocol without intravenous contrast. Multiplanar CT image reconstructions of the cervical spine were also generated. RADIATION DOSE REDUCTION: This exam was performed according to the departmental dose-optimization program which includes automated exposure control, adjustment of the mA and/or kV according to patient size and/or use of iterative reconstruction technique. COMPARISON:  Brain MRI 06/05/2019.  Head CT 05/14/2019. FINDINGS: CT HEAD FINDINGS Brain: Generalized cerebral atrophy. Redemonstrated  chronic cortically-based infarcts within the bilateral frontal lobes and left parietal lobe. 16 x 5 mm age-indeterminate infarct centered within the left corona radiata and caudate nucleus, new from the prior brain MRI of 06/05/2019 but otherwise age indeterminate. Background mild patchy and ill-defined hypoattenuation within the cerebral white matter, nonspecific but compatible with chronic small ischemic disease. There is no acute intracranial hemorrhage. No extra-axial fluid collection. No evidence of an intracranial mass. No midline shift. Vascular: No hyperdense vessel.  Atherosclerotic calcifications. Skull: No fracture or aggressive osseous lesion. Sinuses/Orbits: No mass or acute finding within the imaged orbits. No significant paranasal sinus disease. CT CERVICAL SPINE FINDINGS Alignment: No significant spondylolisthesis. Skull base and vertebrae: The basion-dental and atlanto-dental intervals are maintained.No evidence of acute fracture to the cervical spine. Soft tissues and spinal canal: No prevertebral fluid or swelling. No visible canal hematoma. Disc levels: Prior C5-C6 ACDF. Solid arthrodesis across the C5-C6 disc space. No evidence of acute hardware compromise. Background cervical spondylosis with multilevel disc space narrowing and disc bulges/central disc protrusions. Disc space narrowing is greatest at C4-C5 (moderately advanced at this level). No appreciable high-grade spinal canal  stenosis. No significant bony neural foraminal narrowing. Upper chest: No consolidation within the imaged lung apices. No visible pneumothorax. IMPRESSION: CT head: 1. 16 x 5 mm infarct within the left corona radiata/caudate nucleus, new from the prior brain MRI of 06/05/2019 but otherwise age indeterminate. Consider a brain MRI for further evaluation. 2. No acute posttraumatic intracranial findings. 3. Redemonstrated chronic cortically-based infarcts within bilateral frontal and left parietal lobes. 4. Background mild cerebral white matter chronic small vessel ischemic disease. 5. Generalized cerebral atrophy. CT cervical spine: 1. No evidence of acute fracture to the cervical spine. 2. Prior C5-C6 ACDF. 3. Cervical spondylosis as described. Electronically Signed   By: Jackey Loge D.O.   On: 08/08/2022 14:05   VAS [place] ABI WITH/WO TBI  Result Date: 07/27/2022  LOWER EXTREMITY DOPPLER STUDY Patient Name:  [name]  Date of Exam:   07/26/2022 Medical Rec #: [id number]        Accession #:    [id number] Date of Birth: 06/14/1950        Patient Gender: F Patient Age:   32 years Exam Location:  [facility] Procedure:      VAS [place] ABI WITH/WO TBI Referring Phys: Ovid Curd --------------------------------------------------------------------------------  Indications: Ulceration. Patient has an non-healing wound for over 1 year on the              medial plantar surface under left 1st toe. Patient unable to walk              for exercise due to severe neuropathy. She has no feeling from both              knees down to her toes. Unable to determine claudication symptoms.              She denies rest pain. History of right transmetatarsal amputation. High Risk Factors: Hypertension, hyperlipidemia, Diabetes, no history of                    smoking, prior CVA. Other Factors: History of OSA, CKD stage 2.  Limitations: Patient fell on her side last night and was unable to sleep much.              She failed to take her  blood pressure medication this morning and  indicates her side is painful. Elevated brachial blood pressures.              Dr. [place] (D[facility]) notified after test completed. SEE NOTES BELOW              UNDER TEST SUMMARY. Comparison Study: Prior ABI 12/01/2021 showed right .94 left 1.05 Performing Technologist: Carlos American RVT  Examination Guidelines: A complete evaluation includes at minimum, Doppler waveform signals and systolic blood pressure reading at the level of bilateral brachial, anterior tibial, and posterior tibial arteries, when vessel segments are accessible. Bilateral testing is considered an integral part of a complete examination. Photoelectric Plethysmograph (PPG) waveforms and toe systolic pressure readings are included as required and additional duplex testing as needed. Limited examinations for reoccurring indications may be performed as noted.  ABI Findings: +---------+------------------+-----+---------+---------+ Right    Rt Pressure (mmHg)IndexWaveform Comment   +---------+------------------+-----+---------+---------+ Brachial 234                                       +---------+------------------+-----+---------+---------+ PTA      248               1.04 biphasic           +---------+------------------+-----+---------+---------+ PERO     222               0.93 triphasic          +---------+------------------+-----+---------+---------+ DP       216               0.90 biphasic           +---------+------------------+-----+---------+---------+ Great Toe0                 0.00          amputated +---------+------------------+-----+---------+---------+ +---------+------------------+-----+---------+-------+ Left     Lt Pressure (mmHg)IndexWaveform Comment +---------+------------------+-----+---------+-------+ Brachial 239                                     +---------+------------------+-----+---------+-------+ PTA      237                0.99 triphasic        +---------+------------------+-----+---------+-------+ PERO     248               1.06 biphasic         +---------+------------------+-----+---------+-------+ DP       246               1.03 biphasic         +---------+------------------+-----+---------+-------+ Great Toe167               0.70 Abnormal         +---------+------------------+-----+---------+-------+ +-------+-----------+-----------+------------+------------+ ABI/TBIToday's ABIToday's TBIPrevious ABIPrevious TBI +-------+-----------+-----------+------------+------------+ Right  1.04       amputated  .94                      +-------+-----------+-----------+------------+------------+ Left   1.04       .70        1.05                     +-------+-----------+-----------+------------+------------+  TOES Findings: +----------+---------------+--------+---------+ Right ToesPressure (mmHg)WaveformComment   +----------+---------------+--------+---------+ 1st Digit  amputated +----------+---------------+--------+---------+ 2nd Digit                Normal            +----------+---------------+--------+---------+ 3rd Digit                Normal            +----------+---------------+--------+---------+ 4th Digit                Abnormal          +----------+---------------+--------+---------+ 5th Digit                Normal            +----------+---------------+--------+---------+  +---------+---------------+--------+-------+ Left ToesPressure (mmHg)WaveformComment +---------+---------------+--------+-------+ 1st Digit               Normal          +---------+---------------+--------+-------+ 2nd Digit               Abnormal        +---------+---------------+--------+-------+ 3rd Digit               Normal          +---------+---------------+--------+-------+ 4th Digit               Normal           +---------+---------------+--------+-------+ 5th Digit               Normal          +---------+---------------+--------+-------+   Right ABIs appear increased compared to prior study on 12/01/2021. Left ABIs appear essentially unchanged compared to prior study on 12/01/2021.  Summary: Right: Resting right ankle-brachial index is within normal range. Left: Resting left ankle-brachial index is within normal range. The left toe-brachial index is normal. Dr. [place] (DOD) notified about patient's elevated blood pressure readings. Dr. [place] sent patient home with instructions to take blood pressure medication and monitor blood pressure readings. *See table(s) above for measurements and observations.  Electronically signed by Nanetta Batty [name] on 07/27/2022 at 6:15:18 AM.    Final     Labs:  Basic Metabolic Panel: Recent Labs  Lab 08/17/22 0552 08/18/22 1509 08/19/22 0023 08/22/22 0659  NA 136 136  --  137  K 4.1 4.2  --  3.8  CL 102 104  --  103  CO2 25 25  --  27  GLUCOSE 186* 254*  --  131*  BUN 46* 39*  --  36*  CREATININE 1.69* 1.83* 1.77* 1.64*  CALCIUM 8.8* 8.5*  --  8.9    CBC: Recent Labs  Lab 08/22/22 0659  WBC 6.7  HGB 8.8*  HCT 27.1*  MCV 99.3  PLT 264    CBG: Recent Labs  Lab 08/22/22 2055 08/23/22 0609 08/23/22 1130 08/23/22 1635 08/23/22 2055  GLUCAP 247* 134* 204* 156* 153*   Family history.  Mother with hypertension as well as Alzheimer disease father with CAD and bladder cancer.  Denies any colon cancer esophageal cancer or rectal cancer  Brief HPI:   [name] is a 72 y.o. 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  Per chart review lives alone independent prior to admission but did use a cane when outside the home.  Presented  08/08/2022 with progressive weakness and recurrent  falls.  Per her sister after most recent fall patient sat on the floor for almost 10 hours and could not get up without assistance and was found covered in urine and feces.  Cranial CT scan showed a 16 x 5 mm infarct within the left corona radiata caudate, new from prior brain MRI 06/05/2019 but otherwise age-indeterminate.  No acute posttraumatic intracranial findings.  Redemonstrated chronic cortically based infarcts within bilateral frontal and left parietal lobes.  CT cervical spine negative for fracture or dislocation.  MRI multifocal bilateral acute ischemia predominantly affecting the deep gray matter.  There was also 2 cortical foci within the right MCA territory.  No hemorrhage or mass effect.  Old bilateral MCA territory infarcts.  Chest x-ray suspect acute fracture right eighth rib.  Patient did not receive TNK.  As well as swelling of lower extremities however patient had not been taking her Lasix for several months.  MRA of the head was unremarkable.  Admission chemistries unremarkable except sodium 134 glucose 172 BUN 45 creatinine 2.29 WBC 14,800 CK 1265 urine culture greater than 100,000 E. coli hemoglobin A1c 7.0.  Carotid Dopplers with left 40 to 59% ICA stenosis.  Echocardiogram ejection fraction of 60 to 65% no wall motion abnormalities grade 1 diastolic dysfunction.  Neurology follow-up patient placed on low-dose aspirin and Plavix for CVA prophylaxis x 3 weeks then aspirin alone.  Recommendations of 30-day cardiac event monitor.  Placed on Lovenox for DVT prophylaxis.  Placed on a course of intravenous Rocephin for UTI.  Follow-up hemoglobin 08/11/2022 of 8.3 that is steadily declined from 12.52 weeks prior with no visible signs of bleeding initial concern for possible relation to hemoconcentration with WBC and platelets also dropped.  FOBT pending and anemia panel.  Her follow-up CBC following day had improved to 9.5.  Wound care consulted for  left foot wound followed by podiatry Dr. Ardelle Anton and recently did see the patient 5/13 and wound debrided as well as pressure injury noted right left buttocks.  Therapy evaluations completed due to patient decreased functional mobility was admitted for a comprehensive rehab program.   Hospital Course: [name] was admitted to rehab 08/12/2022 for inpatient therapies to consist of PT, ST and OT at least three hours five days a week. Past admission physiatrist, therapy team and rehab RN have worked together to provide customized collaborative inpatient rehab.  Pertaining to patient's right MCA territory infarct and left subcortical lacunar infarct remained stable aspirin and Plavix x 3 weeks then aspirin alone.  Lovenox for DVT prophylaxis.  Mood stabilization with Lexapro as well as Wellbutrin.  Wound care nurse follow-up for mild sacral wound small tear minimal bleeding covered in Ensure no soiling.  Left first MTP wound left foot wound cleanse daily and dressed with back to trace and ointment and silicone foam dressing followed by podiatry services.  Prevalon boots had been ordered.  Lateral heel wounds noted 5/31 Sister mentioned they were present prior to admission.  She was placed on doxycycline for wound coverage suspect cellulitis.  E. coli UTI with course of Rocephin completed no dysuria noted and afebrile.  Blood pressure controlled on Cozaar as well as Coreg and the addition of Norvasc.  Lipitor ongoing for hyperlipidemia.  Diabetes mellitus with peripheral neuropathy hemoglobin A1c 7.0 with insulin therapy as directed and diabetic teaching.  Obesity BMI 34.21 with dietary follow-up.  AKI on CKD stage III she did receive initial IV fluids her Cozaar had been reduced and latest creatinine 1.83.  Acute  anemia no bleeding episodes latest hemoglobin 9.0.   Media Information   Document Information  Photos    08/19/2022 09:00  Attached To:  Hospital Encounter on 08/12/22  Source  Information  Shaune Leeks, RN  [facility]     Media Information   Document Information  Photos    08/19/2022 08:43  Attached To:  Hospital Encounter on 08/12/22  Source Information  Shaune Leeks, RN  [facility]   Blood pressures were monitored on TID basis and controlled  Diabetes has been monitored with ac/hs CBG checks and SSI was use prn for tighter BS control.    Rehab course: During patient's stay in rehab weekly team conferences were held to monitor patient's progress, set goals and discuss barriers to discharge. At admission, patient required min mod assist 10 feet rolling walker minimal assist sit to stand  Physical exam.  Blood pressure 180/60 pulse 61 temperature 97.8 respiration 17 oxygen saturations 100% room air Constitutional.  No acute distress HEENT Head.  Normocephalic and atraumatic Eyes.  Pupils round and reactive to light no discharge without nystagmus Neck.  Supple nontender no JVD without thyromegaly Cardiac regular rate and rhythm without any extra sounds or murmur heard Abdomen.  Soft nontender positive bowel sounds without rebound Respiratory effort normal no respiratory distress without wheeze Skin.  Scattered bruising on bilateral lower extremities.  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 Bilateral lower extremities 3 out of 5 proximal, 4 out of 5 distally Neurologic.  Alert oriented fluent speech no substitution or dysarthria.  He/She  has had improvement in activity tolerance, balance, postural control as well as ability to compensate for deficits. He/She has had improvement in functional use RUE/LUE  and  RLE/LLE as well as improvement in awareness.  Donned clothing edges bed supervision.  Sit to stand rolling walker for balance.  Ambulates 150 feet rolling walker in the hallway.  Ambulates to the bathroom rolling walker contact-guard.  Ambulates to the shower seat rolling walker contact-guard cues for using grab bar.  Completed dressing tasks seated in recliner with increased time and minimal assist.  Full family teaching completed plan discharge to home       Disposition: Discharge to home    Diet: Diabetic diet  Special Instructions: No driving smoking or alcohol  Apply Bactroban to left foot wound daily then cover with foam dressing.  Change foam dressing every 3 days as needed soiling  Wound care to sacrum.  Cleanse daily and as needed incontinence using house skin cleanser, pat dry.  Cover any open areas with Xeroform gauze, top with silicone foam dressing.  Change Xeroform daily, may reuse foam for up to 3 days  Bilateral heel wounds.  Mepilex dressing change daily or as needed if soaked.  Offload with Prevalon boot in bed.  Medications at discharge. 1.  Tylenol as needed 2.  Norvasc 10 mg p.o. daily 3.  Aspirin 81 mg p.o. daily 4.  Lipitor 80 mg p.o. daily 5.  Wellbutrin XL 300 mg p.o. daily 6.  Coreg 6.25 mg p.o. twice daily 7.  Plavix 75 mg p.o. daily until 08/30/2022 and stop 8.  Lexapro 20 mg p.o. daily 9.  Insulin and pH-regular human (70-30) 24 units in the morning and 28 units in the evening 10.  Cozaar 25 mg p.o. daily 11.  Melatonin 3 mg nightly as needed sleep 12.  Nolvadex 20 mg p.o. daily 13.  Zinc sulfate capsule 220 mg p.o. daily 14.  Glucophage 500 mg twice daily 15.  Doxycycline 100 mg every 12 hours x 3 days and stop  30-35 minutes were spent completing discharge summary and discharge planning  Discharge Instructions     Ambulatory referral to Neurology   Complete by: As directed    An appointment is requested in approximately: 4 weeks right MCA  infarction   Ambulatory referral to Occupational Therapy   Complete by: As directed    Evaluate and treat   Ambulatory referral to Physical Medicine Rehab   Complete by: As directed    Moderate complexity follow-up 1 to 2 weeks right MCA infarction   Ambulatory referral to Physical Therapy   Complete by: As directed    Evaluate and treat        Follow-up Information     [name], DO Follow up.   Specialty: Physical Medicine and Rehabilitation Why: Office to call for appointment Contact information: [address] [place] [address] [phone]                 Signed: Charlton Amor 08/24/2022, 5:18 AM

## 2022-08-21 NOTE — Progress Notes (Signed)
Physical Therapy Session Note  Patient Details  Name: [name] MRN: [id number] Date of Birth: 02-Aug-1950  Today's Date: 08/21/2022 PT Individual Time: 4098-1191 PT Individual Time Calculation (min): 56 min   Short Term Goals: Week 2:  PT Short Term Goal 1 (Week 2): STG=LTGs secondary to ELOS  Skilled Therapeutic Interventions/Progress Updates:    Pt presents in bed and agreeable to therapy. Donned clothing from EOB (bed mobility independent for supine to sit with HOB elevated) with overall supervision and initially required min assist for sit > stand for facilitation of anterior weightshift with RW for balance to pull up pants. Next attempt for sit > stand with CGA overall and short distance gait with RW in the room to sink and standing balance with supervision to style hair. Functional gait training with RW in hallway x 150' with overall supervision to CGA with cues for safe positioning of RW, upright posture and attention to L foot clearance. Discussed overall goals for home and next week in therapy to prepare for d/c.  Introduced HEP for [employer] exercises to address strength and balance. Pt performed 5-10 reps x 1 sets for BLE including LAQ with 5 second hold, standing hip abduction, standing hamstring curls, mini squats, and standing heel raises. Modified toe raises to a seated position for ankle pumps due to L heel wound. Pt return demonstrated exercises, required rest breaks between exercises due to fatigue. Vitals = HR = 83 bpm; O2 = 99-100%.   End of session returned to recliner with CGA overall for balance with RW and all needs in reach.      Therapy Documentation Precautions:  Precautions Precautions: Fall Required Braces or Orthoses: Other Brace Other Brace: Darco Left  - chronic wound Restrictions Weight Bearing Restrictions: No  Pain:  Denies pain.    Therapy/Group: Individual Therapy  Karolee Stamps Darrol Poke, PT, DPT, CBIS  08/21/2022, 7:55 AM

## 2022-08-21 NOTE — Progress Notes (Signed)
PROGRESS NOTE   Subjective/Complaints:  Nurse contacted me about the fact the patient was having nerve pain in her feet yesterday. I asked the patient today and she denied having nerve pain. Told me discomfort generally from wounds on heels. Otherwise she's doing well.  ROS: Patient denies fever, rash, sore throat, blurred vision, dizziness, nausea, vomiting, diarrhea, cough, shortness of breath or chest pain, joint or back/neck pain, headache, or mood change.   Objective:   No results found. No results for input(s): "WBC", "HGB", "HCT", "PLT" in the last 72 hours.  Recent Labs    08/18/22 1509 08/19/22 0023  NA 136  --   K 4.2  --   CL 104  --   CO2 25  --   GLUCOSE 254*  --   BUN 39*  --   CREATININE 1.83* 1.77*  CALCIUM 8.5*  --     Intake/Output Summary (Last 24 hours) at 08/21/2022 0845 Last data filed at 08/21/2022 0700 Borkowski per 24 hour  Intake 688 ml  Output --  Net 688 ml        Physical Exam: Vital Signs Blood pressure (!) 150/51, pulse 64, temperature 98.5 F (36.9 C), temperature source Oral, resp. rate 18, height 5\' 9"  (1.753 m), weight 105.5 kg, SpO2 97 %.   Constitutional: No distress . Vital signs reviewed. HEENT: NCAT, EOMI, oral membranes moist Neck: supple Cardiovascular: RRR without murmur. No JVD    Respiratory/Chest: CTA Bilaterally without wheezes or rales. Normal effort    GI/Abdomen: BS +, non-tender, non-distended Ext: no clubbing, cyanosis, or edema Psych: pleasant and cooperative  Skin:  + R MTP wound - chronic, stable.  + Buttocks skin tear - stable on last assessment + Bilateral heel/hindfoot wounts, clean base, serous drainage; mild blanching erythema around R wound--similar to below today       Neurologic: AAOx3.  No apparent deficits. Cranial nerves II through XII intact,  motor strength is 5/5 BL UE , Bilateral lower extremities 4 out of 5 - unchanged Sensory exam  reduced sensation to light touch  lower extremities to the knees-unchanged Musculoskeletal: Full range of motion in all 4 extremities in bed.    Assessment/Plan: 1. Functional deficits which require 3+ hours per day of interdisciplinary therapy in a comprehensive inpatient rehab setting. Physiatrist is providing close team supervision and 24 hour management of active medical problems listed below. Physiatrist and rehab team continue to assess barriers to discharge/monitor patient progress toward functional and medical goals  Care Tool:  Bathing    Body parts bathed by patient: Right arm, Left arm, Abdomen, Chest, Front perineal area, Buttocks, Right upper leg, Left upper leg, Right lower leg, Left lower leg, Face         Bathing assist Assist Level: Contact Guard/Touching assist     Upper Body Dressing/Undressing Upper body dressing   What is the patient wearing?: Pull over shirt    Upper body assist Assist Level: Set up assist    Lower Body Dressing/Undressing Lower body dressing      What is the patient wearing?: Underwear/pull up, Pants     Lower body assist Assist for lower body dressing: Contact Guard/Touching  assist     Toileting Toileting    Toileting assist Assist for toileting: Minimal Assistance - Patient > 75% Assistive Device Comment:  (walker)   Transfers Chair/bed transfer  Transfers assist     Chair/bed transfer assist level: Contact Guard/Touching assist     Locomotion Ambulation   Ambulation assist      Assist level: Contact Guard/Touching assist Assistive device: Walker-rolling Max distance: 150'   Walk 10 feet activity   Assist     Assist level: Contact Guard/Touching assist Assistive device: Walker-rolling   Walk 50 feet activity   Assist    Assist level: Contact Guard/Touching assist Assistive device: Walker-rolling    Walk 150 feet activity   Assist Walk 150 feet activity did not occur: Safety/medical concerns  (2/2 fatigue)  Assist level: Contact Guard/Touching assist Assistive device: Walker-rolling    Walk 10 feet on uneven surface  activity   Assist     Assist level: Minimal Assistance - Patient > 75% Assistive device: Walker-rolling   Wheelchair     Assist Is the patient using a wheelchair?: No             Wheelchair 50 feet with 2 turns activity    Assist            Wheelchair 150 feet activity     Assist          Blood pressure (!) 150/51, pulse 64, temperature 98.5 F (36.9 C), temperature source Oral, resp. rate 18, height 5\' 9"  (1.753 m), weight 105.5 kg, SpO2 97 %.  Medical Problem List and Plan: 1. Functional deficits secondary to right MCA territory infarcts on 08/08/2022 and left subcortical lacunar infarct.  Recommendations for 30-day cardiac event monitor             -patient may shower             -ELOS/Goals: 10-14 days, SPV PT/OT/SLP; DC date 6/6  - rec therapy consult per pt request 5/28   - Family Ed monday  -cogniton appears appropriate today. No issues with PT this morning.  2.  Antithrombotics: -DVT/anticoagulation:  Pharmaceutical: Lovenox             -antiplatelet therapy: Aspirin 81 mg daily and Plavix 75 mg daily (6/10)x 3 weeks then aspirin alone 3. Pain Management: Oxycodone as needed  -mgt of foot wounds as below 4. Mood/Behavior/Sleep: Lexapro 20 mg daily, Wellbutrin 300 mg daily             -antipsychotic agents: N/A 5. Neuropsych/cognition: This patient is capable of making decisions on her own behalf. 6. Skin/Wound Care: Skin care as advised to left foot wound.  Routine skin checks..  Follow-up outpatient podiatry             - Mild sacral wound, small tear with minimal bleeding; cover and ensure no soiling - WOCN saw 5/26  - L 1st MTP wound: Left foot wound is to be cleansed daily and dressed with bacitracin ointment and silicone foam dressing. Confirmed orders in chart, appropriate 5/31.   - Bilateral heel wounds,  noted 5/31; Sister mentions they were present prior to admission, however not documented on inpatient, or in WOCN assessments 5/23 and 5/25. Foam dressings changed PRN or daily, PREVALON boots ordered. Will discuss w/ nursing intake skin assessment findings.    6/2 reviewed wound care with pt. She's wearing PREVALON's at night 7. Fluids/Electrolytes/Nutrition: Routine in and outs with follow-up chemistries 8.  E. coli UTI.  Intravenous Rocephin  08/09/2022 9.  Hypertension.  Cozaar 50 mg daily, Coreg 6.25 mg twice daily.  Monitor with increased mobility  - 5/27: BP mildly elevated, monitor with activity this week and consider increase in Cozaar  - 5/29: BP improved this AM; monitor  - 5/30: BP mildly elevated this a.m., heart rate low/stable.  Trend, if additional agent needed will consider Norvasc 5 mg.  - 5/31: Elevated BP. Add Norvasc 5 mg daily, Reduce Cozaar to 25 mg daily d/t ongoing elevated Cr. Add PRN hydralazine 10 mg for SBP >170.   6/2 fair to improved. No changes today Vitals:   08/20/22 1946 08/21/22 0351  BP: (!) 150/46 (!) 150/51  Pulse: 63 64  Resp: 18 18  Temp: 98.4 F (36.9 C) 98.5 F (36.9 C)  SpO2: 100% 97%     10.  Hyperlipidemia.  Lipitor 11.  Diabetes mellitus with peripheral neuropathy.  Hemoglobin A1c 7.0.  Currently on SSI and Semglee 15 units nightly.  Diabetic teaching.    - 5/27: Poorly controlled, increase Semglee to 18 U  - 5/28: Increase Semglee to 22 U - improved; monitor on current regimen  - 5/30: If BMP stable today, will restart metformin 1000 mg twice daily in a.m.  - AKI ongoing, hold   6/2 added AM semglee 5u on 6/1 to better cover PM CBG'---looks like she'll need further titration--obsv at the 5u AM dose today. Continue 22u qPM dose Recent Labs    08/20/22 1636 08/20/22 2101 08/21/22 0615  GLUCAP 174* 240* 153*       12.  Obesity.  BMI 34.21.  Dietary follow-up 13.  AKI on CKD stage III.  Most recent creatinine 1.42 earlier this month.   Follow-up chemistries  - 5/27: BUN/Cr uptrending to 37/1.64 this AM; encourage fluids, repeat in 2 days  - 5/29: BUN/Cr increased; add 1 L IVF LR 100/hr today, recheck in AM  - Cr 1.8 5/30 -> 1.7 1/31; reduced Cozaar as above, Encouraged PO fluids, recheck Monday  14.  History of ductal carcinoma in situ of left breast.  Status postlumpectomy radioactive seed 2021.  Will discuss resuming Nolvadex.  Follow-up Dr. Pamelia Hoit 15.Right eighth rib fracture from fall.Conservative care  - Adding lidocaine patches to R chest  16.  Acute anemia.  Follow-up CBC.    17. Edema. TEDs (can cut out toes for wounds) and elevate legs in bed.  Given creatinine at baseline, consider addition of diuretic for edema if worsening. - hold off until Cr stabilizes  18.  Urge/Stress incontinence. PVRS x3 days. Encourage double voiding. Cannot use 5-alpha reductase inhibitors d/t sulfa allergy. Will hold off and monitor with above measures, consider Ditropan.  - 5/31: PVRs remain low, no incontinence documented; has not been double voiding.  No medication intervention for now, encouraged double voiding.  -6/2 LBM 6/1, having consistent Bms      LOS: 9 days A FACE TO FACE EVALUATION WAS PERFORMED  Ranelle Oyster 08/21/2022, 8:45 AM

## 2022-08-21 NOTE — Progress Notes (Addendum)
Family education not scheduled. However, educated patient and POA on current medications and on s/s of infection to monitor for r/t wounds.  Tilden Dome, LPN

## 2022-08-21 NOTE — Progress Notes (Signed)
Occupational Therapy Session Note  Patient Details  Name: [name] MRN: [id number] Date of Birth: 10-04-1950  Today's Date: 08/21/2022 OT Individual Time: 1350-1430 OT Individual Time Calculation (min): 40 min    Short Term Goals: Week 2:  OT Short Term Goal 1 (Week 2): LTG=STG 2/2 ELOS  Skilled Therapeutic Interventions/Progress Updates:  Pt received sitting in recliner for skilled OT session with focus on general conditioning. Pt agreeable to interventions, demonstrating overall pleasant mood. Pt with no reports of pain. OT offering intermediate rest breaks and positioning suggestions throughout session to address pain/fatigue and maximize participation/safety in session.   Pt ambulates from room>main therapy gym with no more than CGA + RW, requiring cuing for safe RW management. In main therapy gym, pt participates in series of UE/LE strengthening exercises targeting general conditioning. Listed below: -Chest press -Leg lifts -Overhead press -Leg kicks with ball -Ball toss   Pt performs 1 set/10-15 reps of each exercise with 3# dowel bar.   Pt requests transport back to room due to increased fatigue, performing bed mobility with Mod A for BLE elevation. Leg lifter retrieved and patient educated on use.   Pt remained resting in bed with all immediate needs met at end of session. Pt continues to be appropriate for skilled OT intervention to promote further functional independence.   Therapy Documentation Precautions:  Precautions Precautions: Fall Required Braces or Orthoses: Other Brace Other Brace: Darco Left  - chronic wound Restrictions Weight Bearing Restrictions: No   Therapy/Group: Individual Therapy  Lou Cal, OTR/L, MSOT  08/21/2022, 5:41 AM

## 2022-08-21 NOTE — Plan of Care (Signed)
  Problem: Consults Goal: RH STROKE PATIENT EDUCATION Description: See Patient Education module for education specifics  08/21/2022 1604 by Elgie Congo, LPN Outcome: Progressing 08/21/2022 1604 by Tilden Dome A, LPN Outcome: Progressing   Problem: RH BOWEL ELIMINATION Goal: RH STG MANAGE BOWEL WITH ASSISTANCE Description: STG Manage Bowel with min Assistance. 08/21/2022 1604 by Elgie Congo, LPN Outcome: Progressing 08/21/2022 1604 by Elgie Congo, LPN Outcome: Progressing Goal: RH STG MANAGE BOWEL W/MEDICATION W/ASSISTANCE Description: STG Manage Bowel with Medication with min Assistance. 08/21/2022 1604 by Elgie Congo, LPN Outcome: Progressing 08/21/2022 1604 by Tilden Dome A, LPN Outcome: Progressing   Problem: RH BLADDER ELIMINATION Goal: RH STG MANAGE BLADDER WITH ASSISTANCE Description: STG Manage Bladder With min Assistance 08/21/2022 1604 by Elgie Congo, LPN Outcome: Progressing 08/21/2022 1604 by Elgie Congo, LPN Outcome: Progressing   Problem: RH SKIN INTEGRITY Goal: RH STG SKIN FREE OF INFECTION/BREAKDOWN Description: Skin will improve with healing of stage I to buttocks and prevention of any additional breakdown/infection to left foot with min assist  08/21/2022 1604 by Tilden Dome A, LPN Outcome: Progressing 08/21/2022 1604 by Elgie Congo, LPN Outcome: Progressing Goal: RH STG MAINTAIN SKIN INTEGRITY WITH ASSISTANCE Description: STG Maintain Skin Integrity With min Assistance. 08/21/2022 1604 by Elgie Congo, LPN Outcome: Progressing 08/21/2022 1604 by Elgie Congo, LPN Outcome: Progressing Goal: RH STG ABLE TO PERFORM INCISION/WOUND CARE W/ASSISTANCE Description: STG Able To Perform Incision/Wound Care With supervision Assistance. 08/21/2022 1604 by Elgie Congo, LPN Outcome: Progressing 08/21/2022 1604 by Elgie Congo, LPN Outcome: Progressing   Problem: RH SAFETY Goal: RH STG ADHERE TO SAFETY PRECAUTIONS  W/ASSISTANCE/DEVICE Description: STG Adhere to Safety Precautions With cueing Assistance/Device. 08/21/2022 1604 by Elgie Congo, LPN Outcome: Progressing 08/21/2022 1604 by Tilden Dome A, LPN Outcome: Progressing Goal: RH STG DECREASED RISK OF FALL WITH ASSISTANCE Description: STG Decreased Risk of Fall With min Assistance. 08/21/2022 1604 by Elgie Congo, LPN Outcome: Progressing 08/21/2022 1604 by Elgie Congo, LPN Outcome: Progressing   Problem: RH COGNITION-NURSING Goal: RH STG ANTICIPATES NEEDS/CALLS FOR ASSIST W/ASSIST/CUES Description: STG Anticipates Needs/Calls for Assist With cueing Assistance/Cues. 08/21/2022 1604 by Tilden Dome A, LPN Outcome: Progressing 08/21/2022 1604 by Elgie Congo, LPN Outcome: Progressing   Problem: RH PAIN MANAGEMENT Goal: RH STG PAIN MANAGED AT OR BELOW PT'S PAIN GOAL Description: Pain will be managed at 4 out of 10 on pain scale with PRN medications min assist  08/21/2022 1604 by Tilden Dome A, LPN Outcome: Progressing 08/21/2022 1604 by Tilden Dome A, LPN Outcome: Progressing   Problem: RH KNOWLEDGE DEFICIT Goal: RH STG INCREASE KNOWLEDGE OF HYPERTENSION Description: Patient/caregiver will be able to manage medications and diet modifications to improve hypertension from nursing education and nursing handout independently  08/21/2022 1604 by Elgie Congo, LPN Outcome: Progressing 08/21/2022 1604 by Tilden Dome A, LPN Outcome: Progressing Goal: RH STG INCREASE KNOWLEGDE OF HYPERLIPIDEMIA Description: Patient/caregiver will be able to manage cholesterol medications and diet modifications to improve HDL levels from nursing education and nursing handouts independently  08/21/2022 1604 by Elgie Congo, LPN Outcome: Progressing 08/21/2022 1604 by Tilden Dome A, LPN Outcome: Progressing Goal: RH STG INCREASE KNOWLEDGE OF STROKE PROPHYLAXIS 08/21/2022 1604 by Elgie Congo, LPN Outcome: Progressing 08/21/2022 1604 by Elgie Congo,  LPN Outcome: Progressing

## 2022-08-22 LAB — BASIC METABOLIC PANEL
Anion gap: 7 (ref 5–15)
BUN: 36 mg/dL — ABNORMAL HIGH (ref 8–23)
CO2: 27 mmol/L (ref 22–32)
Calcium: 8.9 mg/dL (ref 8.9–10.3)
Chloride: 103 mmol/L (ref 98–111)
Creatinine, Ser: 1.64 mg/dL — ABNORMAL HIGH (ref 0.44–1.00)
GFR, Estimated: 33 mL/min — ABNORMAL LOW (ref >60)
Glucose, Bld: 131 mg/dL — ABNORMAL HIGH (ref 70–99)
Potassium: 3.8 mmol/L (ref 3.5–5.1)
Sodium: 137 mmol/L (ref 135–145)

## 2022-08-22 LAB — GLUCOSE, CAPILLARY
Glucose-Capillary: 119 mg/dL — ABNORMAL HIGH (ref 70–99)
Glucose-Capillary: 172 mg/dL — ABNORMAL HIGH (ref 70–99)
Glucose-Capillary: 162 mg/dL — ABNORMAL HIGH (ref 70–99)
Glucose-Capillary: 247 mg/dL — ABNORMAL HIGH (ref 70–99)

## 2022-08-22 LAB — CBC
HCT: 27.1 % — ABNORMAL LOW (ref 36.0–46.0)
Hemoglobin: 8.8 g/dL — ABNORMAL LOW (ref 12.0–15.0)
MCH: 32.2 pg (ref 26.0–34.0)
MCHC: 32.5 g/dL (ref 30.0–36.0)
MCV: 99.3 fL (ref 80.0–100.0)
Platelets: 264 10*3/uL (ref 150–400)
RBC: 2.73 MIL/uL — ABNORMAL LOW (ref 3.87–5.11)
RDW: 14.1 % (ref 11.5–15.5)
WBC: 6.7 10*3/uL (ref 4.0–10.5)
nRBC: 0 % (ref 0.0–0.2)

## 2022-08-22 MED ORDER — DOXYCYCLINE HYCLATE 100 MG PO TABS
100.0000 mg | ORAL_TABLET | Freq: Two times a day (BID) | ORAL | Status: DC
  Administered 2022-08-22 – 2022-08-25 (×7): 100 mg via ORAL
  Filled 2022-08-22 (×7): qty 1

## 2022-08-22 MED ORDER — METFORMIN HCL 500 MG PO TABS
500.0000 mg | ORAL_TABLET | Freq: Two times a day (BID) | ORAL | Status: DC
  Administered 2022-08-23 – 2022-08-24 (×3): 500 mg via ORAL
  Filled 2022-08-22 (×3): qty 1

## 2022-08-22 MED ORDER — DIPHENHYDRAMINE HCL 25 MG PO CAPS: 50.0000 mg | ORAL_CAPSULE | Freq: Three times a day (TID) | ORAL | Status: DC | PRN

## 2022-08-22 NOTE — Progress Notes (Signed)
Occupational Therapy Session Note  Patient Details  Name: [name] MRN: [id number] Date of Birth: April 05, 1950  Today's Date: 08/22/2022 OT Individual Time: 4098-1191 OT Individual Time Calculation (min): 30 min    Short Term Goals: Week 1:  OT Short Term Goal 1 (Week 1): Patient will dress self with set up assist OT Short Term Goal 1 - Progress (Week 1): Progressing toward goal OT Short Term Goal 2 (Week 1): Patient will toilet self with supervision/ set up OT Short Term Goal 2 - Progress (Week 1): Progressing toward goal OT Short Term Goal 3 (Week 1): Patient will bathe self with set up assistance OT Short Term Goal 3 - Progress (Week 1): Progressing toward goal Week 2:  OT Short Term Goal 1 (Week 2): LTG=STG 2/2 ELOS     Skilled Therapeutic Interventions/Progress Updates:    Pt received in bed ready for therapy.  Focus of therapy session on activity tolerance and UE strengthening for arm use in ADLs.   Pt stated she was very fatigued from prior sessions but agreeable to participating if she worked in the room.   Had pt sit to EOB and use a 3 lb dowel bar for various exercises of shoulder presses, bicep curls, torso twists, rowing, "kayaking".  Added a red resistance band for resisted arm pulls for back strength with cues to extend spine for improved posture.     Pt did well with exercises and then opted to lay down.  Pt able to scoot herself up in bed with rails with bed semi declined.  Recommended next time she scoot her hips closer to pillows before laying down.       Pt resting in bed with all needs met. Alarm set and call light in reach.    Therapy Documentation Precautions:  Precautions Precautions: Fall Required Braces or Orthoses: Other Brace Other Brace: Darco Left  - chronic wound Restrictions Weight Bearing Restrictions: No Pain: Pain Assessment Pain Scale: 0-10 Pain Score: 0-No pain ADL: ADL Eating: Minimal assistance Where Assessed-Eating:  Chair Grooming: Minimal assistance Where Assessed-Grooming: Chair Upper Body Bathing: Setup Where Assessed-Upper Body Bathing: Shower Lower Body Bathing: Minimal assistance Where Assessed-Lower Body Bathing: Shower Upper Body Dressing: Setup Where Assessed-Upper Body Dressing: Chair Lower Body Dressing: Maximal assistance Where Assessed-Lower Body Dressing: Sitting at sink, Standing at sink Toileting: Minimal assistance Where Assessed-Toileting: Teacher, adult education: Curator Method: Proofreader: Raised toilet seat Tub/Shower Transfer: Unable to assess Tub/Shower Transfer Method: Unable to assess Film/video editor: Minimal assistance Film/video editor Method: Designer, industrial/product: Transfer tub bench ADL Comments: limited by fatigue at end of session - vitals stable   Therapy/Group: Individual Therapy  Kyrel Leighton 08/22/2022, 12:47 PM

## 2022-08-22 NOTE — Progress Notes (Signed)
PROGRESS NOTE   Subjective/Complaints:  No acute complaints. No events overnight.  Notable increased erythema around L heel today; patient states she has taken Doxycycline in the past without reaction despite sulfa allergy.   ROS:  + BL LE edema + Stress incontinence Patient denies fever, rash, sore throat, blurred vision, dizziness, nausea, vomiting, diarrhea, cough, shortness of breath or chest pain, joint or back/neck pain, headache, or mood change.   Objective:   No results found. Recent Labs    08/22/22 0659  WBC 6.7  HGB 8.8*  HCT 27.1*  PLT 264    Recent Labs    08/22/22 0659  NA 137  K 3.8  CL 103  CO2 27  GLUCOSE 131*  BUN 36*  CREATININE 1.64*  CALCIUM 8.9     Intake/Output Summary (Last 24 hours) at 08/22/2022 0823 Last data filed at 08/22/2022 0700 Kuras per 24 hour  Intake 1072 ml  Output --  Net 1072 ml         Physical Exam: Vital Signs Blood pressure (!) 129/48, pulse (!) 56, temperature 98.1 F (36.7 C), temperature source Oral, resp. rate 18, height 5\' 9"  (1.753 m), weight 105.5 kg, SpO2 100 %.   Constitutional: No distress . Vital signs reviewed. Laying in bed HEENT: NCAT, EOMI, oral membranes moist Neck: supple Cardiovascular: RRR without murmur. No JVD    Respiratory/Chest: CTA Bilaterally without wheezes or rales. Normal effort    GI/Abdomen: BS +, non-tender, non-distended Ext: no clubbing, cyanosis, or edema Psych: pleasant and cooperative  Skin:  + R MTP wound - chronic, stable.  + Buttocks skin tear - stable on last assessment + Bilateral heel/hindfoot wounts, clean base, serous drainage; R wound Improvingh, L wound with blanching erythema, warm to touch, moderate serous drainage       Neurologic: AAOx3.  No apparent deficits. Cranial nerves II through XII intact,  motor strength is 5/5 BL UE , Bilateral lower extremities 4 out of 5 - unchanged Sensory exam  reduced sensation to light touch  lower extremities to the knees-unchanged Musculoskeletal: Full range of motion in all 4 extremities in bed.    Assessment/Plan: 1. Functional deficits which require 3+ hours per day of interdisciplinary therapy in a comprehensive inpatient rehab setting. Physiatrist is providing close team supervision and 24 hour management of active medical problems listed below. Physiatrist and rehab team continue to assess barriers to discharge/monitor patient progress toward functional and medical goals  Care Tool:  Bathing    Body parts bathed by patient: Right arm, Left arm, Abdomen, Chest, Front perineal area, Buttocks, Right upper leg, Left upper leg, Right lower leg, Left lower leg, Face         Bathing assist Assist Level: Contact Guard/Touching assist     Upper Body Dressing/Undressing Upper body dressing   What is the patient wearing?: Pull over shirt    Upper body assist Assist Level: Set up assist    Lower Body Dressing/Undressing Lower body dressing      What is the patient wearing?: Underwear/pull up, Pants     Lower body assist Assist for lower body dressing: Contact Guard/Touching assist     Toileting Toileting  Toileting assist Assist for toileting: Minimal Assistance - Patient > 75% Assistive Device Comment:  (walker)   Transfers Chair/bed transfer  Transfers assist     Chair/bed transfer assist level: Contact Guard/Touching assist     Locomotion Ambulation   Ambulation assist      Assist level: Contact Guard/Touching assist Assistive device: Walker-rolling Max distance: 150'   Walk 10 feet activity   Assist     Assist level: Contact Guard/Touching assist Assistive device: Walker-rolling   Walk 50 feet activity   Assist    Assist level: Contact Guard/Touching assist Assistive device: Walker-rolling    Walk 150 feet activity   Assist Walk 150 feet activity did not occur: Safety/medical concerns  (2/2 fatigue)  Assist level: Contact Guard/Touching assist Assistive device: Walker-rolling    Walk 10 feet on uneven surface  activity   Assist     Assist level: Minimal Assistance - Patient > 75% Assistive device: Walker-rolling   Wheelchair     Assist Is the patient using a wheelchair?: No             Wheelchair 50 feet with 2 turns activity    Assist            Wheelchair 150 feet activity     Assist          Blood pressure (!) 129/48, pulse (!) 56, temperature 98.1 F (36.7 C), temperature source Oral, resp. rate 18, height 5\' 9"  (1.753 m), weight 105.5 kg, SpO2 100 %.  Medical Problem List and Plan: 1. Functional deficits secondary to right MCA territory infarcts on 08/08/2022 and left subcortical lacunar infarct.  Recommendations for 30-day cardiac event monitor             -patient may shower             -ELOS/Goals: 10-14 days, SPV PT/OT/SLP; DC date 6/6  - rec therapy consult per pt request 5/28   - Family Ed monday  -cogniton appears appropriate today. No issues with PT this morning.  2.  Antithrombotics: -DVT/anticoagulation:  Pharmaceutical: Lovenox             -antiplatelet therapy: Aspirin 81 mg daily and Plavix 75 mg daily (6/10)x 3 weeks then aspirin alone 3. Pain Management: Oxycodone as needed  -mgt of foot wounds as below 4. Mood/Behavior/Sleep: Lexapro 20 mg daily, Wellbutrin 300 mg daily             -antipsychotic agents: N/A 5. Neuropsych/cognition: This patient is capable of making decisions on her own behalf. 6. Skin/Wound Care: Skin care as advised to left foot wound.  Routine skin checks..  Follow-up outpatient podiatry             - Mild sacral wound, small tear with minimal bleeding; cover and ensure no soiling - WOCN saw 5/26  - L 1st MTP wound: Left foot wound is to be cleansed daily and dressed with bacitracin ointment and silicone foam dressing. Confirmed orders in chart, appropriate 5/31.   - Bilateral heel  wounds, noted 5/31; Sister mentions they were present prior to admission, however not documented on inpatient, or in WOCN assessments 5/23 and 5/25. Foam dressings changed PRN or daily, PREVALON boots ordered. Will discuss w/ nursing intake skin assessment findings.   6/3: Add doxycycline BID for 5 days for possible cellulitis on L foot ulceration. Added PRN benadryll in case of drug reaction.   7. Fluids/Electrolytes/Nutrition: Routine in and outs with follow-up chemistries 8.  E. coli UTI.  Intravenous Rocephin 08/09/2022 9.  Hypertension.  Cozaar 50 mg daily, Coreg 6.25 mg twice daily.  Monitor with increased mobility  - 5/27: BP mildly elevated, monitor with activity this week and consider increase in Cozaar  - 5/29: BP improved this AM; monitor  - 5/30: BP mildly elevated this a.m., heart rate low/stable.  Trend, if additional agent needed will consider Norvasc 5 mg.  - 5/31: Elevated BP. Add Norvasc 5 mg daily, Reduce Cozaar to 25 mg daily d/t ongoing elevated Cr. Add PRN hydralazine 10 mg for SBP >170.   6/3 - stable; monitor Vitals:   08/21/22 1909 08/22/22 0426  BP: (!) 141/52 (!) 129/48  Pulse: 64 (!) 56  Resp: 18 18  Temp: 98.4 F (36.9 C) 98.1 F (36.7 C)  SpO2: 100% 100%     10.  Hyperlipidemia.  Lipitor 11.  Diabetes mellitus with peripheral neuropathy.  Hemoglobin A1c 7.0.  Currently on SSI and Semglee 15 units nightly.  Diabetic teaching.    - 5/27: Poorly controlled, increase Semglee to 18 U  - 5/28: Increase Semglee to 22 U - improved; monitor on current regimen  - 5/30: If BMP stable today, will restart metformin 1000 mg twice daily in a.m.    - 6/2 added AM semglee 5u on 6/1 to better cover PM CBG'---looks like she'll need further titration--obsv at the 5u AM dose today. Continue 22u qPM dose  - 6/3: Cr improved; resume metformin 500 mg BID -> can increase dose back to 1000 mg BID if tolerated  Recent Labs    08/21/22 1636 08/21/22 2110 08/22/22 0615  GLUCAP 183*  225* 119*        12.  Obesity.  BMI 34.21.  Dietary follow-up 13.  AKI on CKD stage III.  Most recent creatinine 1.42 earlier this month.  Follow-up chemistries  - 5/27: BUN/Cr uptrending to 37/1.64 this AM; encourage fluids, repeat in 2 days  - 5/29: BUN/Cr increased; add 1 L IVF LR 100/hr today, recheck in AM  - Cr 1.8 5/30 -> 1.7 1/31; reduced Cozaar as above, Encouraged PO fluids, recheck Monday - 6/3 Cr improved 1.6; monitor - this appears new baseline   14.  History of ductal carcinoma in situ of left breast.  Status postlumpectomy radioactive seed 2021.  Will discuss resuming Nolvadex.  Follow-up Dr. Pamelia Hoit 15.Right eighth rib fracture from fall.Conservative care  - Adding lidocaine patches to R chest  16.  Acute anemia.  Follow-up CBC.    17. Edema. TEDs (can cut out toes for wounds) and elevate legs in bed.  Given creatinine at baseline, consider addition of diuretic for edema if worsening - edema stable, treating wounds, monitor  18.  Urge/Stress incontinence. PVRS x3 days. Encourage double voiding. Cannot use 5-alpha reductase inhibitors d/t sulfa allergy. Will hold off and monitor with above measures, consider Ditropan.  - 5/31: PVRs remain low, no incontinence documented; has not been double voiding.  No medication intervention for now, encouraged double voiding.  -6/2 LBM 6/1, having consistent Bms      LOS: 10 days A FACE TO FACE EVALUATION WAS PERFORMED  [name] 08/22/2022, 8:23 AM

## 2022-08-22 NOTE — Plan of Care (Signed)
Wound Plan   Braden Score: 15  Sensory: 3  Moisture: 3  Activity: 1  Mobility: 3  Nutrition: 3  Friction: 2   Wounds present: ***  Interventions: ***

## 2022-08-22 NOTE — Progress Notes (Signed)
Physical Therapy Session Note  Patient Details  Name: [name] MRN: [id number] Date of Birth: Apr 05, 1950  Today's Date: 08/22/2022 PT Individual Time: 1415-1510 PT Individual Time Calculation (min): 55 min   Short Term Goals: Week 2:  PT Short Term Goal 1 (Week 2): STG=LTGs secondary to ELOS  Skilled Therapeutic Interventions/Progress Updates:    Chart reviewed and pt agreeable to therapy. Pt received semi-reclined in bed with no c/o pain. Session focused on dynamic sitting balance, amb quality and independence, stair navigation, and balance to promote safe home access. Pt initiated session with transfer to EOB using S. Pt then donned socks and pants with set up A + significant increased time. Pt then amb 275ft to therapy gym using S + RW. Pt noted to have improved step through but still required min VC for narrow gait. Pt completed blocked practice of stair navigation completing 4 steps with CGA + RHR then progressing to 8 steps with same assist. Pt then completed series of 8 steps with RHR and S indicating progress towards therapy goals. Pt then amb 239ft to return to room with S + RW + VC to avoid narrow gait. Pt demonstrated ability to avoid narrow gait with frequent VC. In room, pt completed series of balance exercises including neutral stance with CGA and eyes closed and narrow stance both with MinA indicating continued fall risk. Pt then required MinA to return to bed. At end of session, pt was left semi-reclined in bed with alarm engaged, nurse call bell and all needs in reach.     Therapy Documentation Precautions:  Precautions Precautions: Fall Required Braces or Orthoses: Other Brace Other Brace: Darco Left  - chronic wound Restrictions Weight Bearing Restrictions: No    Therapy/Group: Individual Therapy  Dionne Milo, PT, DPT 08/22/2022, 3:15 PM

## 2022-08-22 NOTE — Progress Notes (Signed)
Occupational Therapy Session Note  Patient Details  Name: [name] MRN: [id number] Date of Birth: [birth year]  Today's Date: 08/22/2022 OT Individual Time: 0900-0930 1st Session, 1300-1345 2nd Session  OT Individual Time Calculation (min): 30 min, 45 min    Short Term Goals: Week 2:  OT Short Term Goal 1 (Week 2): LTG=STG 2/2 ELOS  Skilled Therapeutic Interventions/Progress Updates:    Session 1:  Pt seen for brief partial am self care retraining session. Pt was amb from toilet out to room with NT upon OT arrival for handoff for session. Pt amb with close S and stood sink side up to 2 min for hand washing then required seated rest. OT applied TED hose and socks. Seated and standing level oral and hair care set up and close S with standing. Progressing activity tolerance this session. Pt requested to return to bed post-tx as she wanted to elevate LE's and did not have a session until after lunch. Required use of leg lifter with no clinical assist for LE's into bed. OT left pt bed level with LE's elevated, educated on ankle pumps throughout the day, heel floats for skin protection and engaged bed alarm and left needs in reach.     Pain:  Denies pain this session, reports numbness B LE's d/t long standing PN     Session 2:  Pt open for full shower retraining this session with focus on maximizing indep as pt to d/c later week. Pt requested toileting and moved from bed to EOB with S. Amb with close S to bathroom toilet from bed, no BM but did void. Dist S for peri hygiene while OT warmed water in shower. Pt able to access stall shower with close S using RW then grab bars. Use of LH sponge with overall dist S UB bathing and S only LB bathing incl standing with grab bar for peri and buttocks region. OT removed heel pads and alaerted nursing to assist to re-dress. Pt amb back to EOB and completed donning underwear and pull over tshirt with set up only. Moved to supine for nursing to  complete dressing changes. Handoff to [facility]- nurse.     Pain: aside from stiffness due to prolonged bed rest, pt denies pain this session, reports numbness B LE's d/t long standing PN   Therapy Documentation Precautions:  Precautions Precautions: Fall Required Braces or Orthoses: Other Brace Other Brace: Darco Left  - chronic wound Restrictions Weight Bearing Restrictions: No    Therapy/Group: Individual Therapy  Vicenta Dunning 08/22/2022, 7:52 AM

## 2022-08-23 LAB — GLUCOSE, CAPILLARY
Glucose-Capillary: 134 mg/dL — ABNORMAL HIGH (ref 70–99)
Glucose-Capillary: 204 mg/dL — ABNORMAL HIGH (ref 70–99)
Glucose-Capillary: 156 mg/dL — ABNORMAL HIGH (ref 70–99)
Glucose-Capillary: 153 mg/dL — ABNORMAL HIGH (ref 70–99)

## 2022-08-23 MED ORDER — AMLODIPINE BESYLATE 10 MG PO TABS
10.0000 mg | ORAL_TABLET | Freq: Every day | ORAL | Status: DC
  Administered 2022-08-24 – 2022-08-25 (×2): 10 mg via ORAL
  Filled 2022-08-23: qty 2
  Filled 2022-08-23: qty 1

## 2022-08-23 MED ORDER — BACITRACIN ZINC 500 UNIT/GM EX OINT
TOPICAL_OINTMENT | Freq: Every day | CUTANEOUS | Status: DC
  Administered 2022-08-24: 31.5 via TOPICAL
  Filled 2022-08-23: qty 28.4

## 2022-08-23 NOTE — Progress Notes (Signed)
Occupational Therapy Discharge Summary  Patient Details  Name: [name] MRN: [id number] Date of Birth: 03-Apr-1950  Date of Discharge from OT service:{Time; dates multiple:304500300}  {CHL IP REHAB OT TIME CALCULATIONS:304400400}   Patient has met 12 of 12 long term goals due to improved activity tolerance, improved balance, postural control, ability to compensate for deficits, and improved attention.  Patient to discharge at overall Supervision level.  Patient's care partner is independent to provide the necessary physical assistance at discharge for higher level iALD tasks and to provide supervision for safety. .    Reasons goals not met: n/a  Recommendation:  Patient will benefit from ongoing skilled OT services in outpatient setting to continue to advance functional skills in the area of BADL and iADL.  Equipment: Tub transfer bench   Reasons for discharge: treatment goals met and discharge from hospital  Patient/family agrees with progress made and goals achieved: Yes  OT Discharge Precautions/Restrictions    General   Vital Signs  Pain   ADL ADL Eating: Minimal assistance Where Assessed-Eating: Chair Grooming: Minimal assistance Where Assessed-Grooming: Chair Upper Body Bathing: Setup Where Assessed-Upper Body Bathing: Shower Lower Body Bathing: Minimal assistance Where Assessed-Lower Body Bathing: Shower Upper Body Dressing: Setup Where Assessed-Upper Body Dressing: Chair Lower Body Dressing: Maximal assistance Where Assessed-Lower Body Dressing: Sitting at sink, Standing at sink Toileting: Minimal assistance Where Assessed-Toileting: Teacher, adult education: Curator Method: Proofreader: Raised toilet seat Tub/Shower Transfer: Unable to assess Tub/Shower Transfer Method: Unable to assess Film/video editor: Minimal assistance Film/video editor Method: Designer, industrial/product:  Transfer tub bench ADL Comments: limited by fatigue at end of session - vitals stable Vision   Perception    Praxis   Cognition   Sensation   Motor    Mobility     Trunk/Postural Assessment     Balance   Extremity/Trunk Assessment       [name] 08/23/2022, 8:18 AM

## 2022-08-23 NOTE — Progress Notes (Signed)
PROGRESS NOTE   Subjective/Complaints:  No acute complaints. No events overnight. States that double voiding is not assisting with stress incontinence, but it is an ongoing issue with her.  Has not had a medication reaction to doxycycline.  ROS:  + BL LE edema + Stress incontinence-ongoing Patient denies fever, rash, sore throat, blurred vision, dizziness, nausea, vomiting, diarrhea, cough, shortness of breath or chest pain, joint or back/neck pain, headache, or mood change.   Objective:   No results found. Recent Labs    08/22/22 0659  WBC 6.7  HGB 8.8*  HCT 27.1*  PLT 264     Recent Labs    08/22/22 0659  NA 137  K 3.8  CL 103  CO2 27  GLUCOSE 131*  BUN 36*  CREATININE 1.64*  CALCIUM 8.9     Intake/Output Summary (Last 24 hours) at 08/23/2022 0901 Last data filed at 08/23/2022 0730 Beza per 24 hour  Intake 953 ml  Output 1300 ml  Net -347 ml         Physical Exam: Vital Signs Blood pressure (!) 160/54, pulse 60, temperature 98.2 F (36.8 C), temperature source Oral, resp. rate 17, height 5\' 9"  (1.753 m), weight 105.5 kg, SpO2 100 %.   Constitutional: No distress . Vital signs reviewed.  Reclining in bedside chair. HEENT: NCAT, EOMI, oral membranes moist Neck: supple Cardiovascular: RRR without murmur. No JVD    Respiratory/Chest: CTA Bilaterally without wheezes or rales. Normal effort    GI/Abdomen: BS +, non-tender, non-distended Ext: no clubbing, cyanosis, or edema Psych: pleasant and cooperative  Skin:  + R MTP wound - chronic, stable.  + Buttocks skin tear - stable on last assessment + Bilateral heel/hindfoot wounts, clean base, serous drainage; R wound Improving  L wound with blanching erythema,  -decreased amount, no longer warm to touch, improved appearance       Neurologic: AAOx3.  No apparent deficits. Cranial nerves II through XII intact,  motor strength is 5/5 BL UE ,  Bilateral lower extremities 4 out of 5 - unchanged Sensory exam reduced sensation to light touch  lower extremities to the knees-unchanged Musculoskeletal: Full range of motion in all 4 extremities in bed.    Assessment/Plan: 1. Functional deficits which require 3+ hours per day of interdisciplinary therapy in a comprehensive inpatient rehab setting. Physiatrist is providing close team supervision and 24 hour management of active medical problems listed below. Physiatrist and rehab team continue to assess barriers to discharge/monitor patient progress toward functional and medical goals  Care Tool:  Bathing    Body parts bathed by patient: Right arm, Left arm, Abdomen, Chest, Front perineal area, Buttocks, Right upper leg, Left upper leg, Right lower leg, Left lower leg, Face         Bathing assist Assist Level: Supervision/Verbal cueing     Upper Body Dressing/Undressing Upper body dressing   What is the patient wearing?: Pull over shirt    Upper body assist Assist Level: Independent    Lower Body Dressing/Undressing Lower body dressing      What is the patient wearing?: Pants, Underwear/pull up     Lower body assist Assist for lower body dressing:  Supervision/Verbal cueing     Toileting Toileting    Toileting assist Assist for toileting: Supervision/Verbal cueing Assistive Device Comment:  (walker)   Transfers Chair/bed transfer  Transfers assist     Chair/bed transfer assist level: Supervision/Verbal cueing     Locomotion Ambulation   Ambulation assist      Assist level: Contact Guard/Touching assist Assistive device: Walker-rolling Max distance: 150'   Walk 10 feet activity   Assist     Assist level: Contact Guard/Touching assist Assistive device: Walker-rolling   Walk 50 feet activity   Assist    Assist level: Contact Guard/Touching assist Assistive device: Walker-rolling    Walk 150 feet activity   Assist Walk 150 feet  activity did not occur: Safety/medical concerns (2/2 fatigue)  Assist level: Contact Guard/Touching assist Assistive device: Walker-rolling    Walk 10 feet on uneven surface  activity   Assist     Assist level: Minimal Assistance - Patient > 75% Assistive device: Walker-rolling   Wheelchair     Assist Is the patient using a wheelchair?: No             Wheelchair 50 feet with 2 turns activity    Assist            Wheelchair 150 feet activity     Assist          Blood pressure (!) 160/54, pulse 60, temperature 98.2 F (36.8 C), temperature source Oral, resp. rate 17, height 5\' 9"  (1.753 m), weight 105.5 kg, SpO2 100 %.  Medical Problem List and Plan: 1. Functional deficits secondary to right MCA territory infarcts on 08/08/2022 and left subcortical lacunar infarct.  Recommendations for 30-day cardiac event monitor             -patient may shower             -ELOS/Goals: 10-14 days, SPV PT/OT/SLP; DC date 6/6  - rec therapy consult per pt request 5/28  2.  Antithrombotics: -DVT/anticoagulation:  Pharmaceutical: Lovenox             -antiplatelet therapy: Aspirin 81 mg daily and Plavix 75 mg daily (6/10)x 3 weeks then aspirin alone 3. Pain Management: Oxycodone as needed  -mgt of foot wounds as below 4. Mood/Behavior/Sleep: Lexapro 20 mg daily, Wellbutrin 300 mg daily             -antipsychotic agents: N/A 5. Neuropsych/cognition: This patient is capable of making decisions on her own behalf. 6. Skin/Wound Care: Skin care as advised to left foot wound.  Routine skin checks..  Follow-up outpatient podiatry             - Mild sacral wound, small tear with minimal bleeding; cover and ensure no soiling - WOCN saw 5/26  - L 1st MTP wound: Left foot wound is to be cleansed daily and dressed with bacitracin ointment and silicone foam dressing. Confirmed orders in chart, appropriate 5/31.   - Bilateral heel wounds, noted 5/31; Sister mentions they were present  prior to admission, however not documented on inpatient, or in WOCN assessments 5/23 and 5/25. Foam dressings changed PRN or daily, PREVALON boots ordered. Will discuss w/ nursing intake skin assessment findings.   6/3: Add doxycycline BID for 5 days for possible cellulitis on L foot ulceration. Added PRN benadryll in case of drug reaction. -Tolerating  7. Fluids/Electrolytes/Nutrition: Routine in and outs with follow-up chemistries 8.  E. coli UTI.  Intravenous Rocephin 08/09/2022 9.  Hypertension.  Cozaar 50 mg daily,  Coreg 6.25 mg twice daily.  Monitor with increased mobility  - 5/27: BP mildly elevated, monitor with activity this week and consider increase in Cozaar  - 5/29: BP improved this AM; monitor  - 5/30: BP mildly elevated this a.m., heart rate low/stable.  Trend, if additional agent needed will consider Norvasc 5 mg.  - 5/31: Elevated BP. Add Norvasc 5 mg daily, Reduce Cozaar to 25 mg daily d/t ongoing elevated Cr. Add PRN hydralazine 10 mg for SBP >170.   6/4 - HTN today; was better yesterday; increase Norvasc to 10 mg Vitals:   08/22/22 2005 08/23/22 0345  BP: (!) 162/135 (!) 160/54  Pulse: 69 60  Resp: 18 17  Temp: 98.4 F (36.9 C) 98.2 F (36.8 C)  SpO2: 100% 100%     10.  Hyperlipidemia.  Lipitor 11.  Diabetes mellitus with peripheral neuropathy.  Hemoglobin A1c 7.0.  Currently on SSI and Semglee 15 units nightly.  Diabetic teaching.    - 5/27: Poorly controlled, increase Semglee to 18 U  - 5/28: Increase Semglee to 22 U - improved; monitor on current regimen  - 5/30: If BMP stable today, will restart metformin 1000 mg twice daily in a.m.    - 6/2 added AM semglee 5u on 6/1 to better cover PM CBG'---looks like she'll need further titration--obsv at the 5u AM dose today. Continue 22u qPM dose  - 6/3: Cr improved; resume metformin 500 mg BID -> can increase dose back to 1000 mg BID if tolerated. Repeat BMP Wed.   Recent Labs    08/22/22 1633 08/22/22 2055  08/23/22 0609  GLUCAP 162* 247* 134*        12.  Obesity.  BMI 34.21.  Dietary follow-up 13.  AKI on CKD stage III.  Most recent creatinine 1.42 earlier this month.  Follow-up chemistries  - 5/27: BUN/Cr uptrending to 37/1.64 this AM; encourage fluids, repeat in 2 days  - 5/29: BUN/Cr increased; add 1 L IVF LR 100/hr today, recheck in AM  - Cr 1.8 5/30 -> 1.7 1/31; reduced Cozaar as above, Encouraged PO fluids, recheck Monday - 6/3 Cr improved 1.6; monitor - this appears new baseline   14.  History of ductal carcinoma in situ of left breast.  Status postlumpectomy radioactive seed 2021.  Will discuss resuming Nolvadex.  Follow-up Dr. Pamelia Hoit 15.Right eighth rib fracture from fall.Conservative care  - Adding lidocaine patches to R chest  16.  Acute anemia.  Follow-up CBC.    17. Edema. TEDs (can cut out toes for wounds) and elevate legs in bed.  Given creatinine at baseline, consider addition of diuretic for edema if worsening - edema stable, treating wounds, monitor  18.  Urge/Stress incontinence. PVRS x3 days. Encourage double voiding. Cannot use 5-alpha reductase inhibitors d/t sulfa allergy. Will hold off and monitor with above measures, consider Ditropan.  - 5/31: PVRs remain low, no incontinence documented; has not been double voiding.  No medication intervention for now, encouraged double voiding.  LBM 6/3, having consistent Bms - 6/4: Per nursing, stress incontinence improved; patient does not appreciate this.  Continue ongoing treatment.      LOS: 11 days A FACE TO FACE EVALUATION WAS PERFORMED  [name] 08/23/2022, 9:01 AM

## 2022-08-23 NOTE — Consult Note (Addendum)
WOC Nurse Re-consult Note: Consult requested for sacrum and bilat feet. Refer to previous WOC consult on 5/25. Pt is currently working with therapy and is standing with a walker.  Assessed sacrum; patient was previously noted to have partial thickness skin loss and red macerated skin related to moisture associated skin damage. This has resolved and skin is intact to sacrum and bilat buttocks without open wounds or weeping.  Family member at the bedside requests reassessment of bilat foot wounds since patient will be discharged soon.  I will return at 2:30 when patient has completed her therapy sessions and the visitor agrees with this plan of care.  Post note 2:30:  Pt has been followed by a podiatrist as an outpatient prior to admission for a chronic foot wound.  Left heel with full thickness wound where previous blister ruptured; 5X5X.2cm, 90% red, 10% yellow, small amt yellow drainage. Left outer foot with dry callous which removes easily, revealing .3X.3X.1cm red moist full thickness wound with small amt bloody drainage  Left inner plantar foot with chronic full thickness wound; currently no open wound, dry yellow callous with dark edges, raised above skin level, 1X1cm Right outer foot with dry yellow callous, .5X.5cm, no open wound. Assessed wound appearance with patient and family member at the bedside and discussed topical treatment.  Discussed importance of offloading pressure from left heel, showering without dressings, and performing topical treatment after discharge.  Pt should resume follow-up with podiatrist after discharge for callous trimming. Pt and family member verbalized understanding; she plans to discharge Thurs.  Topical treatment orders provided for bedside nurses to perform as follows: Apply antibiotic ointment to left heel/left outer foot/left inner foot.right outer foot Q day, then cover with foam dressing.  Change foam dressing Q 3 days or PRN soiling Please re-consult if  further assistance is needed.  Thank-you,  Cammie Mcgee MSN, RN, CWOCN, [place], CNS [phone]

## 2022-08-23 NOTE — Patient Care Conference (Signed)
Inpatient RehabilitationTeam Conference and Plan of Care Update Date: 08/24/2022   Time: 10:32 AM    Patient Name: [name]      Medical Record Number: [id number]  Date of Birth: 08/15/1950 Sex: Female         Room/Bed: 4M10C/4M10C-01 Payor Info: Payor: Arna Medici ADVANTAGE / Plan: [employer] PPO / Product Type: *No Product type* /    Admit Date/Time:  08/12/2022  3:55 PM  Primary Diagnosis:  Right middle cerebral artery stroke [facility])  Hospital Problems: Principal Problem:   Right middle cerebral artery stroke [facility])    Expected Discharge Date: Expected Discharge Date: 08/25/22  Team Members Present: Physician leading conference: Dr. Elijah Birk Social Worker Present: Cecile Sheerer, LCSWA Nurse Present: Vedia Pereyra, RN PT Present: Amedeo Plenty, PT OT Present: Kearney Hard, OT PPS Coordinator present : Fae Pippin, SLP     Current Status/Progress Goal Weekly Team Focus  Bowel/Bladder   Patient is continent of Bladder/Bowel episodes LBM 08/21/22   Maintain continence   Assess toileting needs q2-4 Hrs, and prn,, time toileting with patient    Swallow/Nutrition/ Hydration               ADL's   supervision/CGA BADL tasks   Supervision   Addressed activity tolerance and staying out of bed as much as possible, pt/family education and dc planning    Mobility   Supv/ModI for bed mobility; SBA for sit/stands with RW; SBA/CGA for stand pivot transfers and gait >200' with RW; CGA for stair mobility with R HR up to 12 steps.   Supv/CGA with LRAD  Barriers: No true barriers at this time- Continued focus is to improve overall endurance/activity tolerance with household gait distances and stair mobility in order to ensure a safe discharge home    Communication                Safety/Cognition/ Behavioral Observations               Pain   Denies pain, has occassional pain   Pain <- 2/10   Continue to address and assess pain and discomfort QS  and prn with f/u assessment documented    Skin    Diabetic Foot Ulcer (L) submetatarsal surface, Diabetic Heel Ulcer (L), Right anterior foot (blister)      Healing skin uclerations Assess every shift and PRN      Discharge Planning:  D/c to her sister [name]'s home who will provide 24/7care .  Fam edu on Friday (5/31) 8am-12pm. Outpatient PT/OT faced to [facility]. SW will order rollator. SW will confirm there are no barriers to discharge.   Team Discussion: Right MCA/CVA. Continent B/B. Wounds to bilateral feet being addressed by WOC. Doxycycline started for increased redness to left heel. Offloading. Monitor labs for AKI. Monitoring B/P. Family education today. Encourage time OOB to improve endurance.  Patient on target to meet rehab goals: Should be at goal level for discharge 08/25/22  *See Care Plan and progress notes for long and short-term goals.   Revisions to Treatment Plan:  Medication adjustments. WOC for wound care. Monitor labs/VS  Teaching Needs: Medications, safety, skin care, self care, diet modifications, gait/transfer training, etc.   Current Barriers to Discharge: Decreased caregiver support and Wound care  Possible Resolutions to Barriers: Family education completed, WOC to provide instructions for skin care     Medical Summary Current Status: medically complicated by obesity, peripheral edema/lymphedema, stress incontonence,  foot wounds with ?cellulitis, CKD, and hypertension  Barriers to Discharge: Complicated Wound;Incontinence;Medical stability;Morbid Obesity;Renal Insufficiency/Failure;Self-care education;Uncontrolled Diabetes;Uncontrolled Hypertension  Barriers to Discharge Comments: treatment of LE wounds, hypertsension, diabetes, edema Possible Resolutions to [employer] Focus: family and caregiver training for home wound managemen, pressure offloading, antibiotics for cellulitis, titrating medications for HTN and DM, monitorring labs for  CKD   Continued Need for Acute Rehabilitation Level of Care: The patient requires daily medical management by a physician with specialized training in physical medicine and rehabilitation for the following reasons: Direction of a multidisciplinary physical rehabilitation program to maximize functional independence : Yes Medical management of patient stability for increased activity during participation in an intensive rehabilitation regime.: Yes Analysis of laboratory values and/or radiology reports with any subsequent need for medication adjustment and/or medical intervention. : Yes   I attest that I was present, lead the team conference, and concur with the assessment and plan of the team.   Jearld Adjutant 08/24/2022, 9:05 AM

## 2022-08-23 NOTE — Progress Notes (Signed)
Occupational Therapy Session Note  Patient Details  Name: [name] MRN: [id number] Date of Birth: [birth year]  Today's Date: 08/23/2022 Session 1 OT Individual Time: 6578-4696 OT Individual Time Calculation (min): 55 min   Session 2 OT Individual Time: 1300-1345 OT Individual Time Calculation (min): 45 min    Short Term Goals: Week 2:  OT Short Term Goal 1 (Week 2): LTG=STG 2/2 ELOS  Skilled Therapeutic Interventions/Progress Updates:    Session 1 Pt greeted semi-reclined in bed with nursing administered medications. Pt agreeable to shower today with encouragement. Pt completed bed mobility with supervision while nursing took BP. Pt then needed 2 trials and verbal cues for hand placement to get to standing w/ RW, but able to complete with supervision. Pt ambulated to bathroom and transferred into shower with supervision. Bathing completed with set-up/supervision A overall. Dressing tasks completed from recliner with OT assist to remove dressings on B heels. Nursing to enter and change out dressings. Did not put on TED hose yet so nursing could address wounds. Pt left seated in recliner with alarm on, call bell in reach, and needs met.   Session 2 Pt greeted still sitting up in recliner. Pt reported need to go to the bathroom. Sister [name] provided CGA/supervision A for ambulation into bathroom onto toilet. Pt managed clothing and had BM and voided bladder. Pt completed 3/3 toileting tasks with supervision. Pt stood at the sink with RW to wash hands. Wound care nurse then entered to check her bottom in standing with cues to use hand to push up from handles. OT ordered XL TED hose for pt and went through U[facility] home exercise program using red theraband. Pt completed 5 reps of chest pull, straight arm raise, side arm raise, overhead pull, forearm press, and bucep curl. Handoff to PT for next therapy session.   Therapy Documentation Precautions:  Precautions Precautions: Fall Required Braces  or Orthoses: Other Brace Other Brace: Darco Left  - chronic wound Restrictions Weight Bearing Restrictions: No Pain:  Denies pain   Therapy/Group: Individual Therapy  [name] 08/23/2022, 1:57 PM

## 2022-08-23 NOTE — Progress Notes (Addendum)
[name], female   DOB: 1950/12/30, 72 y.o.   MRN: [id number]  1024- SW spoke with pt sister [name] to review discharge, provide updates from team conference, and inform on rollator being recommended. States pt has TTB. SW will order rollator.   SW ordered Rollator with [employer] via parachute.  SW met with pt in room to provide updates from team conference, and review discharge. Confirms rollator delivered. No questions/concerns reported.   Cecile Sheerer, MSW, LCSWA Office: [phone] Cell: [phone] Fax: [phone]

## 2022-08-23 NOTE — Progress Notes (Signed)
Physical Therapy Session Note  Patient Details  Name: [name] MRN: [id number] Date of Birth: 1950-07-07  Today's Date: 08/23/2022 PT Individual Time: 1st Treatment Session: 0930-1030; 2nd Treatment Session: 1345-1430 PT Individual Time Calculation (min): 60 min; 45 min   Short Term Goals: Week 2:  PT Short Term Goal 1 (Week 2): STG=LTGs secondary to ELOS  Skilled Therapeutic Interventions/Progress Updates:  1st Treatment Session- Patient greeted reclined in bedside recliner and agreeable to PT treatment session. Therapist donned R tennis shoe and L darco boot for time management and energy conservation. Patient stood from recliner with RW and SBA- VC for proper hand placement with inconsistent carryover noted throughout treatment session. Patient gait trained to/from her room and main rehab gym with RW and SBA/CGA for safety- VC throughout for increased B step length, increased B foot clearance, stepping within the frame of the RW and improved postural extension with good improvements noted compared to last week.   Patient ascended/descended x12 steps with R HR and CGA/close SBA for safety- Patient utilized a step-to pattern leading with Rt LE while ascending and Lt LE while descending with improved stability noted overall. Minor VC for placing entire Lt foot on the step for improved safety.   Patient gait trained x120' including weaving in between five cones with RW and SBA for safety- VC for staying within the RW throughout turns in order to improve safety and stability. Patient demonstrated poor obstacle negotiation while ambulating back to her chair and bumped into two tray tables on her R.   Patient gait trained back to her room with RW and SBA for safety- Same VC as above with patient increasingly fatigued toward the end of treatment session. Patient left sitting in bedside recliner with call bell within reach and all needs met.    2nd Treatment Session- Patient greeted sitting in  ortho gym with sister, [name], and transitioned from OT care. [name] present for hands-on family training in order to ensure a safe discharge home. Patient performed a car transfer, ascended/descended a low grade ramp and gait trained ~180' with the use of a RW and SBA/Supv. Patient then ascended/descended x4 steps with R HR and CGA provided by [name] with good VC throughout. Patient trialed using a rollator with all functional mobility- VC throughout and demonstration for proper brake management with good carryover noted. Patient also practiced using the rollator as a chair with good stability throughout. All questions answered throughout treatment session with education regarding home safety, etc. Patient returned to her room and left sitting in bedside recliner with posey belt on, call bell within reach and all needs met.    Therapy Documentation Precautions:  Precautions Precautions: Fall Required Braces or Orthoses: Other Brace Other Brace: Darco Left  - chronic wound Restrictions Weight Bearing Restrictions: No  Pain: No/Denies pain.    Therapy/Group: Individual Therapy  Shaneen Reeser 08/23/2022, 7:42 AM

## 2022-08-24 ENCOUNTER — Other Ambulatory Visit (HOSPITAL_COMMUNITY): Payer: Self-pay

## 2022-08-24 DIAGNOSIS — F331 Major depressive disorder, recurrent, moderate: Secondary | ICD-10-CM

## 2022-08-24 DIAGNOSIS — R413 Other amnesia: Secondary | ICD-10-CM

## 2022-08-24 LAB — GLUCOSE, CAPILLARY
Glucose-Capillary: 149 mg/dL — ABNORMAL HIGH (ref 70–99)
Glucose-Capillary: 177 mg/dL — ABNORMAL HIGH (ref 70–99)
Glucose-Capillary: 145 mg/dL — ABNORMAL HIGH (ref 70–99)

## 2022-08-24 LAB — BASIC METABOLIC PANEL
Anion gap: 10 (ref 5–15)
BUN: 37 mg/dL — ABNORMAL HIGH (ref 8–23)
CO2: 23 mmol/L (ref 22–32)
Calcium: 8.8 mg/dL — ABNORMAL LOW (ref 8.9–10.3)
Chloride: 104 mmol/L (ref 98–111)
Creatinine, Ser: 1.57 mg/dL — ABNORMAL HIGH (ref 0.44–1.00)
GFR, Estimated: 35 mL/min — ABNORMAL LOW (ref >60)
Glucose, Bld: 192 mg/dL — ABNORMAL HIGH (ref 70–99)
Potassium: 4.2 mmol/L (ref 3.5–5.1)
Sodium: 137 mmol/L (ref 135–145)

## 2022-08-24 MED ORDER — METFORMIN HCL 500 MG PO TABS
500.0000 mg | ORAL_TABLET | Freq: Two times a day (BID) | ORAL | Status: DC
  Administered 2022-08-24 – 2022-08-25 (×2): 500 mg via ORAL
  Filled 2022-08-24 (×2): qty 1

## 2022-08-24 MED ORDER — METFORMIN HCL 500 MG PO TABS
500.0000 mg | ORAL_TABLET | Freq: Two times a day (BID) | ORAL | 0 refills | Status: DC
  Filled 2022-08-24: qty 60, 30d supply, fill #0

## 2022-08-24 MED ORDER — ESCITALOPRAM OXALATE 20 MG PO TABS
20.0000 mg | ORAL_TABLET | Freq: Every day | ORAL | 0 refills | Status: DC
  Filled 2022-08-24: qty 30, 30d supply, fill #0

## 2022-08-24 MED ORDER — MELATONIN 3 MG PO TABS
3.0000 mg | ORAL_TABLET | Freq: Every evening | ORAL | 0 refills | Status: DC | PRN
  Filled 2022-08-24: qty 60, 60d supply, fill #0

## 2022-08-24 MED ORDER — CLOPIDOGREL BISULFATE 75 MG PO TABS
75.0000 mg | ORAL_TABLET | Freq: Every day | ORAL | 0 refills | Status: DC
  Filled 2022-08-24: qty 5, 5d supply, fill #0

## 2022-08-24 MED ORDER — BUPROPION HCL ER (XL) 300 MG PO TB24
300.0000 mg | ORAL_TABLET | Freq: Every day | ORAL | 0 refills | Status: DC
  Filled 2022-08-24: qty 30, 30d supply, fill #0

## 2022-08-24 MED ORDER — LOSARTAN POTASSIUM 25 MG PO TABS
25.0000 mg | ORAL_TABLET | Freq: Every morning | ORAL | 0 refills | Status: DC
  Filled 2022-08-24: qty 30, 30d supply, fill #0

## 2022-08-24 MED ORDER — CARVEDILOL 6.25 MG PO TABS
6.2500 mg | ORAL_TABLET | Freq: Two times a day (BID) | ORAL | 0 refills | Status: DC
  Filled 2022-08-24: qty 60, 30d supply, fill #0

## 2022-08-24 MED ORDER — AMLODIPINE BESYLATE 10 MG PO TABS
10.0000 mg | ORAL_TABLET | Freq: Every day | ORAL | 0 refills | Status: DC
  Filled 2022-08-24: qty 30, 30d supply, fill #0

## 2022-08-24 MED ORDER — ZINC SULFATE 220 (50 ZN) MG PO TABS
220.0000 mg | ORAL_TABLET | Freq: Every day | ORAL | 0 refills | Status: DC
  Filled 2022-08-24: qty 100, 100d supply, fill #0

## 2022-08-24 MED ORDER — METFORMIN HCL 1000 MG PO TABS
1000.0000 mg | ORAL_TABLET | Freq: Two times a day (BID) | ORAL | 0 refills | Status: DC
  Filled 2022-08-24: qty 60, 30d supply, fill #0

## 2022-08-24 MED ORDER — DOXYCYCLINE HYCLATE 100 MG PO TABS
100.0000 mg | ORAL_TABLET | Freq: Two times a day (BID) | ORAL | 0 refills | Status: DC
  Filled 2022-08-24: qty 6, 3d supply, fill #0

## 2022-08-24 MED ORDER — METFORMIN HCL 500 MG PO TABS: 1000.0000 mg | ORAL_TABLET | Freq: Two times a day (BID) | ORAL | Status: DC

## 2022-08-24 MED ORDER — ACETAMINOPHEN 325 MG PO TABS: 650.0000 mg | ORAL_TABLET | Freq: Four times a day (QID) | ORAL | Status: DC | PRN

## 2022-08-24 MED ORDER — ATORVASTATIN CALCIUM 80 MG PO TABS
80.0000 mg | ORAL_TABLET | Freq: Every day | ORAL | 0 refills | Status: DC
  Filled 2022-08-24: qty 30, 30d supply, fill #0

## 2022-08-24 MED ORDER — ASCORBIC ACID 500 MG PO TABS
500.0000 mg | ORAL_TABLET | Freq: Every day | ORAL | 0 refills | Status: AC
  Filled 2022-08-24: qty 100, 100d supply, fill #0

## 2022-08-24 MED ORDER — SORBITOL 70 % SOLN: 30.0000 mL | Freq: Every day | Status: DC | PRN

## 2022-08-24 NOTE — Progress Notes (Signed)
Inpatient Rehabilitation Discharge Medication Review by a Pharmacist  A complete drug regimen review was completed for this patient to identify any potential clinically significant medication issues.  High Risk Drug Classes Is patient taking? Indication by Medication  Antipsychotic No   Anticoagulant No   Antibiotic Yes Doxycycline x 3 days - cellulitis, wound  Opioid No   Antiplatelet Yes Clopidogrel (until 6/10), aspirin- CVA  Hypoglycemics/insulin Yes Insulin, metformin- DM  Vasoactive Medication Yes Amlodipine, Carvedilol, losartan- BP  Chemotherapy Yes, Oral Chemotherapy Tamoxifen - breast cancer  Other Yes Atorvastatin - HLD Buproprion, escitalopram - mood     Type of Medication Issue Identified Description of Issue Recommendation(s)  Drug Interaction(s) (clinically significant)     Duplicate Therapy     Allergy     No Medication Administration End Date     Incorrect Dose     Additional Drug Therapy Needed     Significant med changes from prior encounter (inform family/care partners about these prior to discharge).    Other       Clinically significant medication issues were identified that warrant physician communication and completion of prescribed/recommended actions by midnight of the next day:  No  Pharmacist comments: None  Time spent performing this drug regimen review (minutes):  20 minutes  Thank you Okey Regal, PharmD

## 2022-08-24 NOTE — Progress Notes (Signed)
Occupational Therapy Session Note  Patient Details  Name: [name] MRN: [id number] Date of Birth: Aug 16, 1950  Today's Date: 08/24/2022 OT Individual Time: 1191-4782 OT Individual Time Calculation (min): 43 min    Short Term Goals: Week 2:  OT Short Term Goal 1 (Week 2): LTG=STG 2/2 ELOS  Skilled Therapeutic Interventions/Progress Updates:  Pt received from direct OT hand-off. Skilled OT session with focus on general strengthening. Pt agreeable to interventions, demonstrating overall pleasant mood. Pt with no reports of pain. OT offering intermediate rest breaks and positioning suggestions throughout session to address pain/fatigue and maximize participation/safety in session.   Pt chooses to stay in room for short OT session. Seated in recliner, pt participates in series of general strengthening exercises, including: -Seated push-ups -Modified sit-ups -Tricep extensions  HEP provided for exercises reviewed this session, and from OT session on 6/5.   Pt remained sitting in recliner with all immediate needs met at end of session. Pt continues to be appropriate for skilled OT intervention to promote further functional independence.   Therapy Documentation Precautions:  Precautions Precautions: Fall Required Braces or Orthoses: Other Brace Other Brace: Left DARCO boot for chronic wound Restrictions Weight Bearing Restrictions: No   Therapy/Group: Individual Therapy  Lou Cal, OTR/L, MSOT  08/24/2022, 2:26 PM

## 2022-08-24 NOTE — Progress Notes (Addendum)
Care order instructions provided to patient for wounds to bilateral feet. Placed in educational binder.   Collected urine in hat, notified RN

## 2022-08-24 NOTE — Progress Notes (Signed)
Occupational Therapy Session Note  Patient Details  Name: [name] MRN: [id number] Date of Birth: 08/06/1950  Today's Date: 08/24/2022 OT Individual Time: 8657-8469 OT Individual Time Calculation (min): 15 min  and Today's Date: 08/24/2022 OT Missed Time: 15 Minutes Missed Time Reason: Other (comment) (pt toileting, unbillable time)   Short Term Goals: Week 2:  OT Short Term Goal 1 (Week 2): LTG=STG 2/2 ELOS  Skilled Therapeutic Interventions/Progress Updates:    Pt received on the toilet, reporting difficulty with voiding. MD rounding during session and aware. Initial 15 min missed d/t pt attempting to void, un-billable time. She completed hygiene seated with (S). Darco shoe donned with min A. She was able to stand with (S) using RW. She completed clothing management with (S), assist only to don incontinence brief. Functional mobility to the EOB with (S) using RW. Attempted to encourage pt to complete standing or seated level grooming tasks but she reported she was  "too tired and needed to lay down". She was left supine with all needs met, bed alarm set.   Therapy Documentation Precautions:  Precautions Precautions: Fall Required Braces or Orthoses: Other Brace Other Brace: Darco Left  - chronic wound Restrictions Weight Bearing Restrictions: No  Therapy/Group: Individual Therapy  Crissie Reese 08/24/2022, 6:56 AM

## 2022-08-24 NOTE — Consult Note (Signed)
Neuropsychological Consultation Comprehensive Inpatient Rehab   Patient:   [name]   DOB:   20-Aug-1950  MR Number:  [id number]  Location:  [facility] [address] CENTER [address] [id number] [place] [address] Dept: [phone] Loc: [phone]           Date of Service:   08/24/2022  Start Time:   3 PM End Time:   4 PM  Provider/Observer:  Arley Phenix, Psy.D.       Clinical Neuropsychologist       Billing Code/Service: [phone]  Reason for Service:    [name] is a 72 year old female referred for neuropsychological consultation during her current admission onto the comprehensive inpatient rehabilitation unit.  The patient has had a recent cerebrovascular event and has had previous strokes as well.  Patient's other medical history includes status post lumpectomy due to ductal carcinoma and left breast with radioactive seed in 2021.  Patient also has been diagnosed previously with obstructive sleep apnea but not currently using CPAP.  She has a history of recurrent major depressive disorder as well as chronic kidney disease and hyperlipidemia.  Patient has been treated with psychotropic medications and continues with her home medicines including Lexapro and Wellbutrin.  Patient had taking trazodone in the past as prescribed by Dr. Karel Jarvis due to sleep issues.  Patient has been followed previously by Dr. Karel Jarvis with neurology due to concerns around memory loss and was last seen in 2021.  At that point the patient had had previous strokes and MRI indicated multiple areas of cerebrovascular related brain changes.  The patient had neuropsychological evaluation performed in 2021 which did not show any indications of progressive neurological disorder and the memory difficulties and cognitive changes were felt to be due to previous cerebrovascular accident and not a progressive dementia of any type.  The patient  presented to the emergency department on 08/08/2022 with progressive weakness and recurrent falls.  Reports by the patient's sister of most recent fall was noted and described as the patient sitting down on the floor for almost 10 hours and could not get up without assistance and was found covered in urine and feces.  Cranial CT scan showed a 16 x 5 mm infarct within the left corona radiata/caudate nucleus which was new when compared to previous MRI from 2021.  MRI redemonstrated chronic cortically based infarcts within the bilateral frontal and left parietal lobes with multifocal bilateral acute ischemia primarily affecting deep gray matter.  There were 2 cortical foci within the right MCA territory with no indication of hemorrhage or mass effect.  Once patient has been stabilized for overall metabolic balance she was evaluated and admitted to the comprehensive inpatient rehabilitation unit due to decreased functional mobility.  During the clinical visit today, the patient was rather lethargic and dysphoric but noted that her depression did not appear to be significantly worse given her recent stroke.  The patient had lots of questions about her risk factors and we addressed risk factors and preventative behaviors including medication compliance.  The patient was not able to give clear reasons for why she is no longer using CPAP device but did indicate that she did not feel like she needed it.  This should be followed up on an outpatient basis to reassess this as it could exacerbate or heighten her potential future risk factors for stroke.  The patient was oriented x 4 with good cognition but cleared slowing of  information processing speed and some difficulties with efficient executive functioning.  HPI for the current admission:    HPI: [name] 72 year old right-handed female with a history of ductal carcinoma in situ of left breast status post lumpectomy with radioactive seed 2021, CKD with baseline  creatinine 1.25-1.42, diabetes mellitus, obesity with BMI 34.21, hypertension, OSA not on CPAP, depression and hyperlipidemia. Per chart review patient lives alone. 1 level home 5 steps to entry. Independent prior to admission but did use a cane when outside of the home. Presented 08/08/2022 with progressive weakness and recurrent falls. Per her sister after most recent fall patient sat on the floor for almost 10 hours and could not get up without assistance and was found covered in urine and feces.. Cranial CT scan showed a 16 x 5 mm infarct within the left corona radiata/caudate nucleus, new from prior brain MRI 06/05/2019 but otherwise age-indeterminate. No acute posttraumatic intracranial findings. Redemonstrated chronic cortically based infarcts within bilateral frontal and left parietal lobes. CT cervical spine negative for fracture or dislocation. MRI multifocal bilateral acute ischemia, predominantly affecting the deep gray matter. There was also 2 cortical foci within the right MCA territory. No hemorrhage or mass effect. Old bilateral MCA territory infarcts. Chest X RAY suspect acute fracture right eighth rib. Patient did not receive TNK. As well as swelling of the lower extremities however patient had not been taking her Lasix for several months. MRA of the head was unremarkable. Admission chemistries unremarkable except sodium 134 glucose 172 BUN 45 creatinine 2.29, WBC 14,800, CK 1265, urine culture greater than 100,000 E. coli, hemoglobin A1c 7.0. Carotid Dopplers with left 40 to 59% ICA stenosis. Echocardiogram ejection fraction of 60 to 65% no wall motion abnormality grade 1 diastolic dysfunction. Neurology follow-up patient placed on low-dose aspirin and Plavix for CVA prophylaxis x 3 weeks then aspirin alone. Recommendations of 30-day cardiac event monitor Lovenox added for DVT prophylaxis. Placed on course of intravenous Rocephin for UTI. Follow-up hemoglobin 08/11/2022 of 8.3 that has steadily  declined from 12.52 weeks prior with no visible signs of bleeding initial concern for possible relation to hemoconcentration his WBC and platelets have also dropped. FOBT pending and anemia panel. His follow-up CBC the following day had improved to 9.5. WOC consulted for left foot wound followed by podiatry Dr. Ardelle Anton and recently did see the patient 5/13 and debrided the wound as well as pressure injury right/left buttocks. Therapy evaluations completed due to patient decreased functional mobility was admitted for a comprehensive rehab program.   Medical History:   Past Medical History:  Diagnosis Date   Breast cancer (HCC) 12/2019   left breast DCIS   CKD (chronic kidney disease) 06/07/2016   Stage 2, GFR 60-89 ml/min   Diabetic polyneuropathy 06/07/2016   Dyslipidemia    Endometrial adenocarcinoma 2012   Fever blister    Hyperlipidemia    Hypertension, essential 06/07/2016   Major depressive disorder    Mixed dyslipidemia 06/07/2016   MRSA infection 10/2019   left foot wound   Obstructive sleep apnea 06/07/2016   does not use CPAP   Peripheral neuropathy    Retinopathy    Severe obesity    Skin ulcer of left foot with fat layer exposed 11/02/2016   Stroke    Asymptomatic, discovered via neuroimaging; small infarct in left parietal lobe; also concern for small b/l frontal infarcts   Tachycardia 07/19/2017   Type 2 diabetes mellitus with hyperglycemia, with long-term current use of insulin 06/07/2016  Unilateral primary osteoarthritis, right knee 04/16/2019         Patient Active Problem List   Diagnosis Date Noted   Major depressive disorder, recurrent episode, moderate (HCC) 08/24/2022   Memory loss due to medical condition 08/24/2022   Right middle cerebral artery stroke (HCC) 08/12/2022   Pressure injury of skin 08/09/2022   UTI (urinary tract infection) 08/09/2022   AKI (acute kidney injury) (HCC) 08/09/2022   CKD stage 3a, GFR 45-59 ml/min (HCC) 08/09/2022   Right rib  fracture 08/09/2022   Traumatic rhabdomyolysis (HCC) 08/09/2022   Lower extremity edema 08/09/2022   Generalized weakness 08/08/2022   Neoplasm of uncertain behavior of skin 12/08/2020   Seborrheic keratosis 12/08/2020   Ductal carcinoma in situ (DCIS) of left breast 01/02/2020   MRSA (methicillin resistant staph aureus) culture positive 10/29/2019   Reactive depression    Cryptogenic stroke (HCC)    Unilateral primary osteoarthritis, right knee 04/16/2019   Morbid (severe) obesity due to excess calories (HCC) 04/16/2019   Mixed hyperlipidemia 07/19/2017   Palpitations 07/19/2017   Tachycardia 07/19/2017   Skin ulcer of left foot with fat layer exposed 11/02/2016   CKD (chronic kidney disease) stage 2, GFR 60-89 ml/min 06/07/2016   Type 2 diabetes mellitus with hyperglycemia, with long-term current use of insulin 06/07/2016   Diabetic peripheral neuropathy (HCC) 06/07/2016   Mixed dyslipidemia 06/07/2016   Hypertension, essential 06/07/2016   DM type 2 (diabetes mellitus, type 2) (HCC) 06/07/2016   Obstructive sleep apnea 06/07/2016    Behavioral Observation/Mental Status:   [name]  presents as a 72 y.o.-year-old Right handed Caucasian Female who appeared her stated age. her dress was Appropriate and she was Well Groomed and her manners were Appropriate to the situation.  her participation was indicative of Appropriate and Redirectable behaviors.  There were physical disabilities noted.  she displayed an appropriate level of cooperation and motivation.    Interactions:    Active Appropriate and Redirectable  Attention:   abnormal and attention span appeared shorter than expected for age  Memory:   abnormal; remote memory intact, recent memory impaired  Visuo-spatial:   not examined  Speech (Volume):  low  Speech:   normal; slowed response style  Thought Process:  Coherent and Relevant  Coherent, Directed, and Logical  Though Content:  WNL; not suicidal and not  homicidal  Orientation:   person, place, time/date, and situation  Judgment:   Fair  Planning:   Fair  Affect:    Depressed  Mood:    Dysphoric  Insight:   Fair  Intelligence:   normal  Psychiatric History:  Patient with past psychiatric history including recurrent major depressive disorder and events.  There is been acute exacerbation of her depression noted as reactive depression.  Family Med/Psych History:  Family History  Problem Relation Age of Onset   Hypertension Mother    Alzheimer's disease Mother    Heart attack Father    CAD Father    Alzheimer's disease Father    Bladder Cancer Father        dx late 57s   Diverticulitis Sister    Obesity Sister    Hypertension Sister    Voice disorder Brother    Breast cancer Paternal Aunt 60   Prostate cancer Paternal Uncle        dx mid 33s    Impression/DX:   JAYLANNI GRAHOVAC is a 72 year old female referred for neuropsychological consultation during her current admission onto the comprehensive inpatient  rehabilitation unit.  The patient has had a recent cerebrovascular event and has had previous strokes as well.  Patient's other medical history includes status post lumpectomy due to ductal carcinoma and left breast with radioactive seed in 2021.  Patient also has been diagnosed previously with obstructive sleep apnea but not currently using CPAP.  She has a history of recurrent major depressive disorder as well as chronic kidney disease and hyperlipidemia.  Patient has been treated with psychotropic medications and continues with her home medicines including Lexapro and Wellbutrin.  Patient had taking trazodone in the past as prescribed by Dr. Karel Jarvis due to sleep issues.  Patient has been followed previously by Dr. Karel Jarvis with neurology due to concerns around memory loss and was last seen in 2021.  At that point the patient had had previous strokes and MRI indicated multiple areas of cerebrovascular related brain changes.  The  patient had neuropsychological evaluation performed in 2021 which did not show any indications of progressive neurological disorder and the memory difficulties and cognitive changes were felt to be due to previous cerebrovascular accident and not a progressive dementia of any type.  The patient presented to the emergency department on 08/08/2022 with progressive weakness and recurrent falls.  Reports by the patient's sister of most recent fall was noted and described as the patient sitting down on the floor for almost 10 hours and could not get up without assistance and was found covered in urine and feces.  Cranial CT scan showed a 16 x 5 mm infarct within the left corona radiata/caudate nucleus which was new when compared to previous MRI from 2021.  MRI redemonstrated chronic cortically based infarcts within the bilateral frontal and left parietal lobes with multifocal bilateral acute ischemia primarily affecting deep gray matter.  There were 2 cortical foci within the right MCA territory with no indication of hemorrhage or mass effect.  Once patient has been stabilized for overall metabolic balance she was evaluated and admitted to the comprehensive inpatient rehabilitation unit due to decreased functional mobility.  During the clinical visit today, the patient was rather lethargic and dysphoric but noted that her depression did not appear to be significantly worse given her recent stroke.  The patient had lots of questions about her risk factors and we addressed risk factors and preventative behaviors including medication compliance.  The patient was not able to give clear reasons for why she is no longer using CPAP device but did indicate that she did not feel like she needed it.  This should be followed up on an outpatient basis to reassess this as it could exacerbate or heighten her potential future risk factors for stroke.  The patient was oriented x 4 with good cognition but cleared slowing of  information processing speed and some difficulties with efficient executive functioning.  Disposition/Plan:  Today we worked on coping and adjustment issues.  The patient is expected to be discharged soon and there will be follow-up post discharge with Dr. Lenox Ponds.  Patient continues to show worsening cognitive deficits including memory loss or other aspects that would be appropriate for repeat neuropsychological testing.  Given the fact that the patient had already been followed by Dr. Karel Jarvis with [facility] neurology and seen by Dr. Milbert Coulter for neuropsychological evaluation it would likely be most appropriate for that referral to be made to their office.          Electronically Signed   _______________________ Arley Phenix, Psy.D. Clinical Neuropsychologist

## 2022-08-24 NOTE — Progress Notes (Addendum)
PROGRESS NOTE   Subjective/Complaints:  No acute complaints. No events overnight. Try to have BM this AM; states it has been several days since last adequate one. Refuses PRN bowel medication at this time.   ROS:  + BL LE edema + Stress incontinence-ongoing +constipation Patient denies fever, rash, sore throat, blurred vision, dizziness, nausea, vomiting, diarrhea, cough, shortness of breath or chest pain, joint or back/neck pain, headache, or mood change.   Objective:   No results found. Recent Labs    08/22/22 0659  WBC 6.7  HGB 8.8*  HCT 27.1*  PLT 264     Recent Labs    08/22/22 0659  NA 137  K 3.8  CL 103  CO2 27  GLUCOSE 131*  BUN 36*  CREATININE 1.64*  CALCIUM 8.9     Intake/Output Summary (Last 24 hours) at 08/24/2022 0743 Last data filed at 08/24/2022 0448 [place] per 24 hour  Intake 712 ml  Output 2350 ml  Net -1638 ml         Physical Exam: Vital Signs Blood pressure (!) 144/70, pulse 70, temperature 98.4 F (36.9 C), temperature source Oral, resp. rate 16, height 5\' 9"  (1.753 m), weight 105.5 kg, SpO2 100 %.   Constitutional: No distress . Vital signs reviewed. On toilet.  HEENT: NCAT, EOMI, oral membranes moist Neck: supple Cardiovascular: RRR without murmur. No JVD    Respiratory/Chest: CTA Bilaterally without wheezes or rales. Normal effort    GI/Abdomen: BS +, non-tender, non-distended Ext: no clubbing, cyanosis, or edema Psych: pleasant and cooperative   From prior exams: Skin:  + R MTP wound - chronic, stable.  + Buttocks skin tear - stable on last assessment + Bilateral heel/hindfoot wounts, clean base, serous drainage; R wound Improving  L wound with blanching erythema,  -decreased amount, no longer warm to touch, improved appearance       Neurologic: AAOx3.  No apparent deficits. Cranial nerves II through XII intact,  motor strength is 5/5 BL UE , Bilateral lower  extremities 4 out of 5 - unchanged Sensory exam reduced sensation to light touch  lower extremities to the knees-unchanged Musculoskeletal: Full range of motion in all 4 extremities in bed.    Assessment/Plan: 1. Functional deficits which require 3+ hours per day of interdisciplinary therapy in a comprehensive inpatient rehab setting. Physiatrist is providing close team supervision and 24 hour management of active medical problems listed below. Physiatrist and rehab team continue to assess barriers to discharge/monitor patient progress toward functional and medical goals  Care Tool:  Bathing    Body parts bathed by patient: Right arm, Left arm, Abdomen, Chest, Front perineal area, Buttocks, Right upper leg, Left upper leg, Right lower leg, Left lower leg, Face         Bathing assist Assist Level: Supervision/Verbal cueing     Upper Body Dressing/Undressing Upper body dressing   What is the patient wearing?: Pull over shirt    Upper body assist Assist Level: Independent    Lower Body Dressing/Undressing Lower body dressing      What is the patient wearing?: Pants, Underwear/pull up     Lower body assist Assist for lower body dressing:  Supervision/Verbal cueing     Toileting Toileting    Toileting assist Assist for toileting: Supervision/Verbal cueing Assistive Device Comment:  (walker)   Transfers Chair/bed transfer  Transfers assist     Chair/bed transfer assist level: Supervision/Verbal cueing     Locomotion Ambulation   Ambulation assist      Assist level: Contact Guard/Touching assist Assistive device: Walker-rolling Max distance: 150'   Walk 10 feet activity   Assist     Assist level: Contact Guard/Touching assist Assistive device: Walker-rolling   Walk 50 feet activity   Assist    Assist level: Contact Guard/Touching assist Assistive device: Walker-rolling    Walk 150 feet activity   Assist Walk 150 feet activity did not  occur: Safety/medical concerns (2/2 fatigue)  Assist level: Contact Guard/Touching assist Assistive device: Walker-rolling    Walk 10 feet on uneven surface  activity   Assist     Assist level: Minimal Assistance - Patient > 75% Assistive device: Walker-rolling   Wheelchair     Assist Is the patient using a wheelchair?: No             Wheelchair 50 feet with 2 turns activity    Assist            Wheelchair 150 feet activity     Assist          Blood pressure (!) 144/70, pulse 70, temperature 98.4 F (36.9 C), temperature source Oral, resp. rate 16, height 5\' 9"  (1.753 m), weight 105.5 kg, SpO2 100 %.  Medical Problem List and Plan: 1. Functional deficits secondary to right MCA territory infarcts on 08/08/2022 and left subcortical lacunar infarct.  Recommendations for 30-day cardiac event monitor             -patient may shower             -ELOS/Goals: 10-14 days, SPV PT/OT/SLP; DC date 6/6  - rec therapy consult per pt request 5/28  2.  Antithrombotics: -DVT/anticoagulation:  Pharmaceutical: Lovenox             -antiplatelet therapy: Aspirin 81 mg daily and Plavix 75 mg daily (6/10)x 3 weeks then aspirin alone 3. Pain Management: Oxycodone as needed  -mgt of foot wounds as below 4. Mood/Behavior/Sleep: Lexapro 20 mg daily, Wellbutrin 300 mg daily             -antipsychotic agents: N/A 5. Neuropsych/cognition: This patient is capable of making decisions on her own behalf. 6. Skin/Wound Care: Skin care as advised to left foot wound.  Routine skin checks..  Follow-up outpatient podiatry             - Mild sacral wound, small tear with minimal bleeding; cover and ensure no soiling - WOCN saw 5/26  - L 1st MTP wound: Left foot wound is to be cleansed daily and dressed with bacitracin ointment and silicone foam dressing. Confirmed orders in chart, appropriate 5/31.   - Bilateral heel wounds, noted 5/31; Sister mentions they were present prior to  admission, however not documented on inpatient, or in WOCN assessments 5/23 and 5/25. Foam dressings changed PRN or daily, PREVALON boots ordered. Will discuss w/ nursing intake skin assessment findings.   - 6/5: WOCN eval: Sacrum healed; Apply antibiotic ointment to left heel/left outer foot/left inner foot.right outer foot Q day, then cover with foam dressing.  Change foam dressing Q 3 days or PRN soiling   6/3: Add doxycycline BID for 5 days for possible cellulitis on L foot  ulceration. Added PRN benadryll in case of drug reaction. -Tolerating  7. Fluids/Electrolytes/Nutrition: Routine in and outs with follow-up chemistries 8.  E. coli UTI.  Intravenous Rocephin 08/09/2022 9.  Hypertension.  Cozaar 50 mg daily, Coreg 6.25 mg twice daily.  Monitor with increased mobility  - 5/27: BP mildly elevated, monitor with activity this week and consider increase in Cozaar  - 5/29: BP improved this AM; monitor  - 5/30: BP mildly elevated this a.m., heart rate low/stable.  Trend, if additional agent needed will consider Norvasc 5 mg.  - 5/31: Elevated BP. Add Norvasc 5 mg daily, Reduce Cozaar to 25 mg daily d/t ongoing elevated Cr. Add PRN hydralazine 10 mg for SBP >170.   6/4 - HTN today; was better yesterday; increase Norvasc to 10 mg; monitor  Vitals:   08/23/22 1943 08/24/22 0445  BP: (!) 152/55 (!) 144/70  Pulse: 66 70  Resp: 16 16  Temp: 97.7 F (36.5 C) 98.4 F (36.9 C)  SpO2: 100% 100%     10.  Hyperlipidemia.  Lipitor 11.  Diabetes mellitus with peripheral neuropathy.  Hemoglobin A1c 7.0.  Currently on SSI and Semglee 15 units nightly.  Diabetic teaching.    - 5/27: Poorly controlled, increase Semglee to 18 U  - 5/28: Increase Semglee to 22 U - improved; monitor on current regimen  - 5/30: If BMP stable today, will restart metformin 1000 mg twice daily in a.m.    - 6/2 added AM semglee 5u on 6/1 to better cover PM CBG'---looks like she'll need further titration--obsv at the 5u AM dose  today. Continue 22u qPM dose  - 6/3: Cr improved; resume metformin 500 mg BID -> can increase dose back to 1000 mg BID if tolerated. Repeat BMP Wed.  - pending  - 6/4: BG improved on metformin. Cr stable; increase back to home 1000 mg BID  Recent Labs    08/23/22 1635 08/23/22 2055 08/24/22 0648  GLUCAP 156* 153* 149*        12.  Obesity.  BMI 34.21.  Dietary follow-up 13.  AKI on CKD stage III.  Most recent creatinine 1.42 earlier this month.  Follow-up chemistries  - 5/27: BUN/Cr uptrending to 37/1.64 this AM; encourage fluids, repeat in 2 days  - 5/29: BUN/Cr increased; add 1 L IVF LR 100/hr today, recheck in AM  - Cr 1.8 5/30 -> 1.7 1/31; reduced Cozaar as above, Encouraged PO fluids, recheck Monday - 6/3 Cr improved 1.6; monitor - this appears new baseline; stable  1.57  14.  History of ductal carcinoma in situ of left breast.  Status postlumpectomy radioactive seed 2021.  Will discuss resuming Nolvadex.  Follow-up Dr. Pamelia Hoit 15.Right eighth rib fracture from fall.Conservative care  - Adding lidocaine patches to R chest  16.  Acute anemia.  Follow-up CBC.    17. Edema. TEDs (can cut out toes for wounds) and elevate legs in bed.  Given creatinine at baseline, consider addition of diuretic for edema if worsening - edema stable, treating wounds, monitor  18.  Urge/Stress incontinence. PVRS x3 days. Encourage double voiding. Cannot use 5-alpha reductase inhibitors d/t sulfa allergy. Will hold off and monitor with above measures, consider Ditropan.  - 5/31: PVRs remain low, no incontinence documented; has not been double voiding.  No medication intervention for now, encouraged double voiding.  LBM 6/3, having consistent Bms - 6/4: Per nursing, stress incontinence improved; patient does not appreciate this.  Continue ongoing treatment.   19. Constipation. Added PRN sorbitol  for this PM if no BM per patient.   LOS: 12 days A FACE TO FACE EVALUATION WAS PERFORMED  [name] 08/24/2022, 7:43 AM

## 2022-08-24 NOTE — Progress Notes (Signed)
Occupational Therapy Session Note  Patient Details  Name: [name] MRN: [id number] Date of Birth: 1950/12/29  Today's Date: 08/24/2022 OT Individual Time: 1330-1400 OT Individual Time Calculation (min): 30 min    Short Term Goals: Week 2:  OT Short Term Goal 1 (Week 2): LTG=STG 2/2 ELOS  Skilled Therapeutic Interventions/Progress Updates:   Pt seen for brief pm session to prepare for discharge tomorrow. Pt requesting use of toilet. Pt rose from recliner with S and amb to and from bathroom with rollator with S. Standing for clothing management with mod I and mod I for peri hygiene. Stood sink side for hand washing with no reports of pain and/fatigue. Educate don use of new rollator (delivered in room) for locking prior to seated rests with + teach back. Pt retruned to recliner and OT worked with pt on several device recommendations for home use to don new compression stockings. OT printed out links in writing with photos and educated verbally on each. Pt aware of options for application upon d/c. Handoff to another OT clinician for completion of d/c training.   Pain:  No pain reported throughout session    Therapy Documentation Precautions:  Precautions Precautions: Fall Required Braces or Orthoses: Other Brace Other Brace: Left DARCO boot for chronic wound Restrictions Weight Bearing Restrictions: No    Therapy/Group: Individual Therapy  Vicenta Dunning 08/24/2022, 12:55 PM

## 2022-08-24 NOTE — Progress Notes (Signed)
Physical Therapy Discharge Summary  Patient Details  Name: [name] MRN: [id number] Date of Birth: December 03, 1950  Date of Discharge from PT service:August 24, 2022  Today's Date: 08/24/2022 PT Individual Time: 1000-1115 PT Individual Time Calculation (min): 75 min    Patient has met 6 of 6 long term goals due to improved activity tolerance, improved balance, and increased strength.  Patient to discharge at an ambulatory level Supervision.   Patient's care partner is independent to provide the necessary physical assistance at discharge.  Reasons goals not met: NA  Recommendation:  Patient will benefit from ongoing skilled PT services in outpatient setting to continue to advance safe functional mobility, address ongoing impairments in dynamic stability, endurance/activity tolerance, global strengthening, and minimize fall risk.  Equipment: Rollator  Reasons for discharge: treatment goals met and discharge from hospital  Patient/family agrees with progress made and goals achieved: Yes  PT Discharge Precautions/Restrictions Precautions Precautions: Fall Required Braces or Orthoses: Other Brace Other Brace: Left DARCO boot for chronic wound Restrictions Weight Bearing Restrictions: No Pain No/Denies pain Pain Interference Pain Interference Pain Effect on Sleep: 1. Rarely or not at all Pain Interference with Therapy Activities: 1. Rarely or not at all Pain Interference with Day-to-Day Activities: 1. Rarely or not at all Vision/Perception  Vision - History Ability to See in Adequate Light: 0 Adequate Perception Perception: Within Functional Limits Praxis Praxis: Intact  Cognition Overall Cognitive Status: Within Functional Limits for tasks assessed Arousal/Alertness: Lethargic Orientation Level: Oriented X4 Year: 2024 Month: June Day of Week: Correct Sustained Attention: Appears intact Memory: Appears intact Awareness: Appears intact Comments: Patient with history of  depression, flat affect, fatigue- Improved since initial evaluation Sensation Sensation Light Touch: Impaired by Ribeiro assessment Peripheral sensation comments: Peripheral neuropathy in B feet Light Touch Impaired Details: Impaired LLE;Impaired RLE Hot/Cold: Appears Intact Proprioception: Appears Intact Coordination [place] Motor Movements are Fluid and Coordinated: No Fine Motor Movements are Fluid and Coordinated: No Motor  Motor Motor: Abnormal postural alignment and control Motor - Discharge Observations: Forward flexed posturing  Mobility Bed Mobility Bed Mobility: Rolling Right;Rolling Left;Supine to Sit;Sit to Supine Rolling Right: Independent Rolling Left: Independent Left Sidelying to Sit: Independent Supine to Sit: Independent Sit to Supine: Independent Transfers Transfers: Sit to Stand;Stand to Sit;Stand Pivot Transfers Sit to Stand: Supervision/Verbal cueing Stand to Sit: Supervision/Verbal cueing Stand Pivot Transfers: Supervision/Verbal cueing Stand Pivot Transfer Details: Verbal cues for safe use of DME/AE;Verbal cues for sequencing Transfer (Assistive device): Rollator Locomotion  Gait Ambulation: Yes Gait Assistance: Supervision/Verbal cueing Gait Distance (Feet): 150 Feet Assistive device: Rollator Gait Assistance Details: Verbal cues for precautions/safety;Verbal cues for safe use of DME/AE;Verbal cues for sequencing Gait Gait: Yes Gait Pattern: Decreased step length - right;Decreased step length - left;Shuffle;Trunk flexed Stairs / Additional Locomotion Stairs: Yes Stairs Assistance: Supervision/Verbal cueing Stair Management Technique: One rail Right Number of Stairs: 12 Height of Stairs: 6 Ramp: Supervision/Verbal cueing Curb: Supervision/Verbal cueing Pick up small object from the floor assist level: Supervision/Verbal cueing Pick up small object from the floor assistive device: Rollator with Engineer, manufacturing Wheelchair Mobility: No   Trunk/Postural Assessment  Cervical Assessment Cervical Assessment: Exceptions to [facility] (Forward flexed head) Thoracic Assessment Thoracic Assessment: Exceptions to [facility] (Rounded shoulders) Lumbar Assessment Lumbar Assessment: Exceptions to [facility] (Posterior pelvic tilt) Postural Control Postural Control: Deficits on evaluation Righting Reactions: Delayed with posterior bias, however improved since initial evaluation  Balance Balance Balance Assessed: Yes Standardized Balance Assessment Standardized Balance Assessment: Programmer, systems Test [facility] Balance Test  Sit to Stand: Able to stand  independently using hands Standing Unsupported: Able to stand 2 minutes with supervision Sitting with Back Unsupported but Feet Supported on Floor or Stool: Able to sit safely and securely 2 minutes Stand to Sit: Controls descent by using hands Transfers: Able to transfer safely, definite need of hands Standing Unsupported with Eyes Closed: Able to stand 10 seconds with supervision Standing Ubsupported with Feet Together: Able to place feet together independently and stand for 1 minute with supervision From Standing, Reach Forward with Outstretched Arm: Can reach forward >12 cm safely (5") From Standing Position, Pick up Object from Floor: Able to pick up shoe, needs supervision From Standing Position, Turn to Look Behind Over each Shoulder: Looks behind from both sides and weight shifts well Turn 360 Degrees: Able to turn 360 degrees safely but slowly Standing Unsupported, Alternately Place Feet on Step/Stool: Able to complete >2 steps/needs minimal assist Standing Unsupported, One Foot in Front: Able to take small step independently and hold 30 seconds Standing on One Leg: Unable to try or needs assist to prevent fall Total Score: 37 Static Sitting Balance Static Sitting - Balance Support: No upper extremity supported;Feet supported Static Sitting - Level of Assistance: 7: Independent Dynamic Sitting  Balance Dynamic Sitting - Balance Support: No upper extremity supported;During functional activity Dynamic Sitting - Level of Assistance: 7: Independent Static Standing Balance Static Standing - Balance Support: Bilateral upper extremity supported Static Standing - Level of Assistance: 5: Stand by assistance Dynamic Standing Balance Dynamic Standing - Balance Support: Bilateral upper extremity supported;During functional activity Dynamic Standing - Level of Assistance: 5: Stand by assistance Extremity Assessment      RLE Assessment RLE Assessment: Exceptions to [facility] General Strength Comments: [place] 3+/5 LLE Assessment LLE Assessment: Exceptions to [facility] General Strength Comments: [place] 3+/5 except hip fl 2+/5 which pt reports is consistent with PLOF > last 5 years  Skilled Intervention: Patient greeted supine in bed asleep, but easily arousable and agreeable to PT treatment session. Patient transitioned to sitting EOB independently without the use of the bed rail. While sitting EOB, patient was able to doff clothes and don clean clothes with Supv/Set-up assistance. VC for improved sequencing in order to ensure safety throughout. Therapist donned ted hose (xxl) for time management. Patient gait trained to/from her room and ortho gym with rollator and supv- VC for stepping within the frame of the rollator, improved postural extension and forward gaze. Patient performed car transfer, ascended/descended a ramp, ascended/descended x12 stairs with R HR all with supv for safety. Patient continues to require VC for improved brake management with inconsistent carryover noted, however sister, [name], aware and able to provide VC at home in order to ensure safety.   Patient completed the [facility] Balance Assessment (please see above for further details)- Patient did increase her score from 19 to 37 since admission. Patient demonstrates increased fall risk as noted by score of 37/56 on Berg Balance Scale. (<36=  high risk for falls, close to 100%; 37-45 significant >80%; 46-51 moderate >50%; 52-55 lower >25%)  Patient provided with a HEP in order to maintain strength and stability at home- Please see below for further details.  Access Code: [email or web address] URL: [email or web address] Date: 08/24/2022 Prepared by: Sherron Ales Kristi Hyer  Exercises - Sit to Stand   - Standing March with Counter Support   - Side Stepping with Counter Support  - Walking  - Loss adjuster, chartered  Patient completed Nustep x10 minutes on level 5 with B UE/LE in order  to increase global strength and endurance. Patient gait trained back to her room and left sitting in bedside recliner with call bell within reach, posey belt on and all needs met.    Cledis Sohn 08/24/2022, 10:54 AM

## 2022-08-24 NOTE — Plan of Care (Signed)
  Problem: RH Balance Goal: LTG Patient will maintain dynamic standing with ADLs (OT) Description: LTG:  Patient will maintain dynamic standing balance with assist during activities of daily living (OT)  Outcome: Completed/Met   Problem: Sit to Stand Goal: LTG:  Patient will perform sit to stand in prep for activites of daily living with assistance level (OT) Description: LTG:  Patient will perform sit to stand in prep for activites of daily living with assistance level (OT) Outcome: Completed/Met   Problem: RH Eating Goal: LTG Patient will perform eating w/assist, cues/equip (OT) Description: LTG: Patient will perform eating with assist, with/without cues using equipment (OT) Outcome: Completed/Met   Problem: RH Grooming Goal: LTG Patient will perform grooming w/assist,cues/equip (OT) Description: LTG: Patient will perform grooming with assist, with/without cues using equipment (OT) Outcome: Completed/Met   Problem: RH Bathing Goal: LTG Patient will bathe all body parts with assist levels (OT) Description: LTG: Patient will bathe all body parts with assist levels (OT) Outcome: Completed/Met   Problem: RH Dressing Goal: LTG Patient will perform lower body dressing w/assist (OT) Description: LTG: Patient will perform lower body dressing with assist, with/without cues in positioning using equipment (OT) Outcome: Completed/Met   Problem: RH Simple Meal Prep Goal: LTG Patient will perform simple meal prep w/assist (OT) Description: LTG: Patient will perform simple meal prep with assistance, with/without cues (OT). Outcome: Completed/Met   Problem: RH Toilet Transfers Goal: LTG Patient will perform toilet transfers w/assist (OT) Description: LTG: Patient will perform toilet transfers with assist, with/without cues using equipment (OT) Outcome: Completed/Met   Problem: RH Tub/Shower Transfers Goal: LTG Patient will perform tub/shower transfers w/assist (OT) Description: LTG:  Patient will perform tub/shower transfers with assist, with/without cues using equipment (OT) Outcome: Completed/Met   Problem: RH Attention Goal: LTG Patient will demonstrate this level of attention during functional activites (OT) Description: LTG:  Patient will demonstrate this level of attention during functional activites  (OT) Outcome: Completed/Met

## 2022-08-25 ENCOUNTER — Encounter: Payer: PPO | Admitting: Podiatry

## 2022-08-25 ENCOUNTER — Ambulatory Visit: Payer: PPO | Admitting: Podiatry

## 2022-08-25 LAB — GLUCOSE, CAPILLARY: Glucose-Capillary: 113 mg/dL — ABNORMAL HIGH (ref 70–99)

## 2022-08-25 NOTE — Plan of Care (Signed)
  Problem: RH Dressing Goal: LTG Patient will perform upper body dressing (OT) Description: LTG Patient will perform upper body dressing with assist, with/without cues (OT). Outcome: Completed/Met   Problem: RH Toileting Goal: LTG Patient will perform toileting task (3/3 steps) with assistance level (OT) Description: LTG: Patient will perform toileting task (3/3 steps) with assistance level (OT)  Outcome: Completed/Met

## 2022-08-25 NOTE — Progress Notes (Signed)
Inpatient Rehabilitation Care Coordinator Discharge Note   Patient Details  Name: [name] MRN: [id number] Date of Birth: 09-Jun-1950   Discharge location: D/c to her sister's home  Length of Stay: 12 days  Discharge activity level: Supervision  Home/community participation: Limited  Patient response WU:JWJXBJ Literacy - How often do you need to have someone help you when you read instructions, pamphlets, or other written material from your doctor or pharmacy?: Never  Patient response YN:WGNFAO Isolation - How often do you feel lonely or isolated from those around you?: Sometimes  Services provided included: MD, RD, PT, OT, RN, CM, TR, Pharmacy, Neuropsych, SW  Financial Services:  Field seismologist Utilized: Private Insurance [employer]  Choices offered to/list presented to: patient and pt sister  Follow-up services arranged:  Outpatient, DME    Outpatient Servicies: [facility] for outpatient PT/OT DME : [employer] for rollator    Patient response to transportation need: Is the patient able to respond to transportation needs?: Yes In the past 12 months, has lack of transportation kept you from medical appointments or from getting medications?: No In the past 12 months, has lack of transportation kept you from meetings, work, or from getting things needed for daily living?: No   Patient/Family verbalized understanding of follow-up arrangements:  Yes  Individual responsible for coordination of the follow-up plan: contact pt sister [name] #[phone]  Confirmed correct DME delivered: Gretchen Short 08/25/2022    Comments (or additional information):family edu completed.   Summary of Stay    Date/Time Discharge Planning CSW  08/22/22 1409 D/c to her sister [name]'s home who will provide 24/7care .  Fam edu on Friday (5/31) 8am-12pm. Outpatient PT/OT faced to [facility]. SW will order rollator. SW will confirm there are no barriers to  discharge. AAC  08/15/22 1033 D/c to her sister [name]'s home who will provide 24/7care . SW will confirm there are no barriers to discharge. Fam edu on Friday (5/31) 8am-12pm. AAC       Niralya Ohanian A Lula Olszewski

## 2022-08-25 NOTE — Progress Notes (Signed)
Recreational Therapy Discharge Summary Patient Details  Name: [name] MRN: [id number] Date of Birth: 12/25/1950 Today's Date: 08/25/2022  Comments on progress toward goals: Pt discharged home today with family to provide the needed supervision/assistance.  TR session focused on pt education-leisure education, activity analysis/modifications & coping.  Pt reported sedentary lifestyle PTA, needed encouragement to participate in previously enjoyed activities.  Diversional activities provided in room for use throughout LOS.  Reasons for discharge: discharge from hospital  Follow-up: Home Health  Patient/family agrees with progress made and goals achieved: Yes  Yamil Dougher 08/25/2022, 4:23 PM

## 2022-09-02 DIAGNOSIS — E1122 Type 2 diabetes mellitus with diabetic chronic kidney disease: Secondary | ICD-10-CM | POA: Diagnosis not present

## 2022-09-02 DIAGNOSIS — I129 Hypertensive chronic kidney disease with stage 1 through stage 4 chronic kidney disease, or unspecified chronic kidney disease: Secondary | ICD-10-CM | POA: Diagnosis not present

## 2022-09-02 DIAGNOSIS — Z89429 Acquired absence of other toe(s), unspecified side: Secondary | ICD-10-CM | POA: Diagnosis not present

## 2022-09-02 DIAGNOSIS — Z9989 Dependence on other enabling machines and devices: Secondary | ICD-10-CM | POA: Diagnosis not present

## 2022-09-02 DIAGNOSIS — N183 Chronic kidney disease, stage 3 unspecified: Secondary | ICD-10-CM | POA: Diagnosis not present

## 2022-09-02 DIAGNOSIS — Z794 Long term (current) use of insulin: Secondary | ICD-10-CM | POA: Diagnosis not present

## 2022-09-02 DIAGNOSIS — Z8673 Personal history of transient ischemic attack (TIA), and cerebral infarction without residual deficits: Secondary | ICD-10-CM | POA: Diagnosis not present

## 2022-09-02 DIAGNOSIS — R296 Repeated falls: Secondary | ICD-10-CM | POA: Diagnosis not present

## 2022-09-02 DIAGNOSIS — D649 Anemia, unspecified: Secondary | ICD-10-CM | POA: Diagnosis not present

## 2022-09-02 DIAGNOSIS — F3341 Major depressive disorder, recurrent, in partial remission: Secondary | ICD-10-CM | POA: Diagnosis not present

## 2022-09-02 DIAGNOSIS — E785 Hyperlipidemia, unspecified: Secondary | ICD-10-CM | POA: Diagnosis not present

## 2022-09-05 ENCOUNTER — Encounter: Payer: Self-pay | Admitting: Neurology

## 2022-09-05 DIAGNOSIS — E11621 Type 2 diabetes mellitus with foot ulcer: Secondary | ICD-10-CM | POA: Diagnosis not present

## 2022-09-05 DIAGNOSIS — Z794 Long term (current) use of insulin: Secondary | ICD-10-CM | POA: Diagnosis not present

## 2022-09-05 DIAGNOSIS — Z89411 Acquired absence of right great toe: Secondary | ICD-10-CM | POA: Diagnosis not present

## 2022-09-05 DIAGNOSIS — E1142 Type 2 diabetes mellitus with diabetic polyneuropathy: Secondary | ICD-10-CM | POA: Diagnosis not present

## 2022-09-10 ENCOUNTER — Emergency Department (HOSPITAL_COMMUNITY): Payer: PPO

## 2022-09-10 ENCOUNTER — Inpatient Hospital Stay (HOSPITAL_COMMUNITY)
Admission: EM | Admit: 2022-09-10 | Discharge: 2022-09-14 | DRG: 041 | Disposition: A | Discharge status: Rehabilitation Facility | Payer: PPO | Attending: Internal Medicine | Admitting: Internal Medicine

## 2022-09-10 ENCOUNTER — Observation Stay (HOSPITAL_COMMUNITY): Payer: PPO

## 2022-09-10 ENCOUNTER — Encounter (HOSPITAL_COMMUNITY): Payer: PPO

## 2022-09-10 ENCOUNTER — Encounter (HOSPITAL_COMMUNITY): Payer: Self-pay

## 2022-09-10 DIAGNOSIS — R4182 Altered mental status, unspecified: Secondary | ICD-10-CM | POA: Diagnosis not present

## 2022-09-10 DIAGNOSIS — Z794 Long term (current) use of insulin: Secondary | ICD-10-CM

## 2022-09-10 DIAGNOSIS — N1832 Chronic kidney disease, stage 3b: Secondary | ICD-10-CM | POA: Diagnosis present

## 2022-09-10 DIAGNOSIS — Z880 Allergy status to penicillin: Secondary | ICD-10-CM

## 2022-09-10 DIAGNOSIS — L97529 Non-pressure chronic ulcer of other part of left foot with unspecified severity: Secondary | ICD-10-CM | POA: Diagnosis present

## 2022-09-10 DIAGNOSIS — R296 Repeated falls: Secondary | ICD-10-CM | POA: Diagnosis present

## 2022-09-10 DIAGNOSIS — E1142 Type 2 diabetes mellitus with diabetic polyneuropathy: Secondary | ICD-10-CM | POA: Diagnosis not present

## 2022-09-10 DIAGNOSIS — I6782 Cerebral ischemia: Secondary | ICD-10-CM | POA: Diagnosis not present

## 2022-09-10 DIAGNOSIS — I6381 Other cerebral infarction due to occlusion or stenosis of small artery: Principal | ICD-10-CM | POA: Diagnosis present

## 2022-09-10 DIAGNOSIS — Z882 Allergy status to sulfonamides status: Secondary | ICD-10-CM

## 2022-09-10 DIAGNOSIS — E1122 Type 2 diabetes mellitus with diabetic chronic kidney disease: Secondary | ICD-10-CM | POA: Diagnosis present

## 2022-09-10 DIAGNOSIS — L97519 Non-pressure chronic ulcer of other part of right foot with unspecified severity: Secondary | ICD-10-CM | POA: Diagnosis present

## 2022-09-10 DIAGNOSIS — G459 Transient cerebral ischemic attack, unspecified: Secondary | ICD-10-CM | POA: Diagnosis not present

## 2022-09-10 DIAGNOSIS — Z803 Family history of malignant neoplasm of breast: Secondary | ICD-10-CM

## 2022-09-10 DIAGNOSIS — G4733 Obstructive sleep apnea (adult) (pediatric): Secondary | ICD-10-CM | POA: Diagnosis not present

## 2022-09-10 DIAGNOSIS — G8191 Hemiplegia, unspecified affecting right dominant side: Secondary | ICD-10-CM | POA: Diagnosis present

## 2022-09-10 DIAGNOSIS — Z8052 Family history of malignant neoplasm of bladder: Secondary | ICD-10-CM

## 2022-09-10 DIAGNOSIS — I10 Essential (primary) hypertension: Secondary | ICD-10-CM | POA: Diagnosis not present

## 2022-09-10 DIAGNOSIS — Z7982 Long term (current) use of aspirin: Secondary | ICD-10-CM

## 2022-09-10 DIAGNOSIS — N1831 Chronic kidney disease, stage 3a: Secondary | ICD-10-CM | POA: Diagnosis not present

## 2022-09-10 DIAGNOSIS — Z820 Family history of epilepsy and other diseases of the nervous system: Secondary | ICD-10-CM

## 2022-09-10 DIAGNOSIS — R29707 NIHSS score 7: Secondary | ICD-10-CM | POA: Diagnosis present

## 2022-09-10 DIAGNOSIS — Z8249 Family history of ischemic heart disease and other diseases of the circulatory system: Secondary | ICD-10-CM

## 2022-09-10 DIAGNOSIS — Z881 Allergy status to other antibiotic agents status: Secondary | ICD-10-CM

## 2022-09-10 DIAGNOSIS — Z8542 Personal history of malignant neoplasm of other parts of uterus: Secondary | ICD-10-CM

## 2022-09-10 DIAGNOSIS — I129 Hypertensive chronic kidney disease with stage 1 through stage 4 chronic kidney disease, or unspecified chronic kidney disease: Secondary | ICD-10-CM | POA: Diagnosis present

## 2022-09-10 DIAGNOSIS — E11319 Type 2 diabetes mellitus with unspecified diabetic retinopathy without macular edema: Secondary | ICD-10-CM | POA: Diagnosis present

## 2022-09-10 DIAGNOSIS — E782 Mixed hyperlipidemia: Secondary | ICD-10-CM

## 2022-09-10 DIAGNOSIS — Z79810 Long term (current) use of selective estrogen receptor modulators (SERMs): Secondary | ICD-10-CM

## 2022-09-10 DIAGNOSIS — R8271 Bacteriuria: Secondary | ICD-10-CM | POA: Diagnosis not present

## 2022-09-10 DIAGNOSIS — Z79899 Other long term (current) drug therapy: Secondary | ICD-10-CM

## 2022-09-10 DIAGNOSIS — I639 Cerebral infarction, unspecified: Secondary | ICD-10-CM | POA: Diagnosis not present

## 2022-09-10 DIAGNOSIS — R29818 Other symptoms and signs involving the nervous system: Secondary | ICD-10-CM | POA: Diagnosis not present

## 2022-09-10 DIAGNOSIS — E1165 Type 2 diabetes mellitus with hyperglycemia: Secondary | ICD-10-CM

## 2022-09-10 DIAGNOSIS — F331 Major depressive disorder, recurrent, moderate: Secondary | ICD-10-CM

## 2022-09-10 DIAGNOSIS — Z7902 Long term (current) use of antithrombotics/antiplatelets: Secondary | ICD-10-CM

## 2022-09-10 DIAGNOSIS — Z853 Personal history of malignant neoplasm of breast: Secondary | ICD-10-CM

## 2022-09-10 DIAGNOSIS — Z6833 Body mass index (BMI) 33.0-33.9, adult: Secondary | ICD-10-CM

## 2022-09-10 DIAGNOSIS — Z8673 Personal history of transient ischemic attack (TIA), and cerebral infarction without residual deficits: Secondary | ICD-10-CM

## 2022-09-10 DIAGNOSIS — R531 Weakness: Secondary | ICD-10-CM | POA: Diagnosis not present

## 2022-09-10 DIAGNOSIS — M25511 Pain in right shoulder: Secondary | ICD-10-CM | POA: Diagnosis present

## 2022-09-10 DIAGNOSIS — Z7984 Long term (current) use of oral hypoglycemic drugs: Secondary | ICD-10-CM

## 2022-09-10 DIAGNOSIS — R079 Chest pain, unspecified: Secondary | ICD-10-CM | POA: Diagnosis not present

## 2022-09-10 DIAGNOSIS — E11621 Type 2 diabetes mellitus with foot ulcer: Secondary | ICD-10-CM | POA: Diagnosis present

## 2022-09-10 LAB — DIFFERENTIAL
Abs Immature Granulocytes: 0.09 10*3/uL — ABNORMAL HIGH (ref 0.00–0.07)
Basophils Absolute: 0 10*3/uL (ref 0.0–0.1)
Basophils Relative: 1 %
Eosinophils Absolute: 0.1 10*3/uL (ref 0.0–0.5)
Eosinophils Relative: 1 %
Immature Granulocytes: 1 %
Lymphocytes Relative: 28 %
Lymphs Abs: 2.2 10*3/uL (ref 0.7–4.0)
Monocytes Absolute: 0.7 10*3/uL (ref 0.1–1.0)
Monocytes Relative: 9 %
Neutro Abs: 4.9 10*3/uL (ref 1.7–7.7)
Neutrophils Relative %: 60 %

## 2022-09-10 LAB — URINALYSIS, ROUTINE W REFLEX MICROSCOPIC
Bilirubin Urine: NEGATIVE
Glucose, UA: NEGATIVE mg/dL
Hgb urine dipstick: NEGATIVE
Ketones, ur: NEGATIVE mg/dL
Nitrite: POSITIVE — AB
Protein, ur: NEGATIVE mg/dL
Specific Gravity, Urine: 1.006 (ref 1.005–1.030)
pH: 5 (ref 5.0–8.0)

## 2022-09-10 LAB — COMPREHENSIVE METABOLIC PANEL WITH GFR
ALT: 18 U/L (ref 0–44)
AST: 18 U/L (ref 15–41)
Albumin: 3.1 g/dL — ABNORMAL LOW (ref 3.5–5.0)
Alkaline Phosphatase: 42 U/L (ref 38–126)
Anion gap: 10 (ref 5–15)
BUN: 32 mg/dL — ABNORMAL HIGH (ref 8–23)
CO2: 22 mmol/L (ref 22–32)
Calcium: 8.9 mg/dL (ref 8.9–10.3)
Chloride: 101 mmol/L (ref 98–111)
Creatinine, Ser: 1.82 mg/dL — ABNORMAL HIGH (ref 0.44–1.00)
GFR, Estimated: 29 mL/min — ABNORMAL LOW
Glucose, Bld: 155 mg/dL — ABNORMAL HIGH (ref 70–99)
Potassium: 4.3 mmol/L (ref 3.5–5.1)
Sodium: 133 mmol/L — ABNORMAL LOW (ref 135–145)
Total Bilirubin: 0.7 mg/dL (ref 0.3–1.2)
Total Protein: 6.9 g/dL (ref 6.5–8.1)

## 2022-09-10 LAB — CBC
HCT: 31.1 % — ABNORMAL LOW (ref 36.0–46.0)
Hemoglobin: 10.2 g/dL — ABNORMAL LOW (ref 12.0–15.0)
MCH: 32.3 pg (ref 26.0–34.0)
MCHC: 32.8 g/dL (ref 30.0–36.0)
MCV: 98.4 fL (ref 80.0–100.0)
Platelets: 272 10*3/uL (ref 150–400)
RBC: 3.16 MIL/uL — ABNORMAL LOW (ref 3.87–5.11)
RDW: 13.3 % (ref 11.5–15.5)
WBC: 8 10*3/uL (ref 4.0–10.5)
nRBC: 0 % (ref 0.0–0.2)

## 2022-09-10 LAB — RAPID URINE DRUG SCREEN, HOSP PERFORMED
Amphetamines: NOT DETECTED
Barbiturates: NOT DETECTED
Benzodiazepines: NOT DETECTED
Cocaine: NOT DETECTED
Opiates: NOT DETECTED
Tetrahydrocannabinol: NOT DETECTED

## 2022-09-10 LAB — PROTIME-INR
INR: 1.1 (ref 0.8–1.2)
Prothrombin Time: 14.6 seconds (ref 11.4–15.2)

## 2022-09-10 LAB — GLUCOSE, CAPILLARY
Glucose-Capillary: 238 mg/dL — ABNORMAL HIGH (ref 70–99)
Glucose-Capillary: 101 mg/dL — ABNORMAL HIGH (ref 70–99)
Glucose-Capillary: 138 mg/dL — ABNORMAL HIGH (ref 70–99)

## 2022-09-10 LAB — APTT: aPTT: 53 s — ABNORMAL HIGH (ref 24–36)

## 2022-09-10 LAB — ETHANOL: Alcohol, Ethyl (B): <10 mg/dL (ref <10)

## 2022-09-10 MED ORDER — ATORVASTATIN CALCIUM 80 MG PO TABS
80.0000 mg | ORAL_TABLET | Freq: Every day | ORAL | Status: DC
  Administered 2022-09-11 – 2022-09-14 (×4): 80 mg via ORAL
  Filled 2022-09-10 (×4): qty 2

## 2022-09-10 MED ORDER — INSULIN ASPART 100 UNIT/ML IJ SOLN
0.0000 [IU] | Freq: Three times a day (TID) | INTRAMUSCULAR | Status: DC
  Administered 2022-09-10: 3 [IU] via SUBCUTANEOUS
  Administered 2022-09-11 (×2): 1 [IU] via SUBCUTANEOUS
  Administered 2022-09-12: 2 [IU] via SUBCUTANEOUS
  Administered 2022-09-12: 3 [IU] via SUBCUTANEOUS
  Administered 2022-09-12 – 2022-09-13 (×2): 1 [IU] via SUBCUTANEOUS
  Administered 2022-09-13: 3 [IU] via SUBCUTANEOUS
  Administered 2022-09-13: 7 [IU] via SUBCUTANEOUS
  Administered 2022-09-14: 2 [IU] via SUBCUTANEOUS
  Administered 2022-09-14: 3 [IU] via SUBCUTANEOUS
  Administered 2022-09-14: 2 [IU] via SUBCUTANEOUS

## 2022-09-10 MED ORDER — BUPROPION HCL ER (XL) 150 MG PO TB24
300.0000 mg | ORAL_TABLET | Freq: Every day | ORAL | Status: DC
  Administered 2022-09-11 – 2022-09-14 (×4): 300 mg via ORAL
  Filled 2022-09-10 (×4): qty 2

## 2022-09-10 MED ORDER — CLOPIDOGREL BISULFATE 75 MG PO TABS
75.0000 mg | ORAL_TABLET | Freq: Every day | ORAL | Status: DC
  Administered 2022-09-11 – 2022-09-14 (×4): 75 mg via ORAL
  Filled 2022-09-10 (×4): qty 1

## 2022-09-10 MED ORDER — ACETAMINOPHEN 325 MG PO TABS
650.0000 mg | ORAL_TABLET | ORAL | Status: DC | PRN
  Administered 2022-09-13: 650 mg via ORAL
  Filled 2022-09-10: qty 2

## 2022-09-10 MED ORDER — ACETAMINOPHEN 650 MG RE SUPP: 650.0000 mg | RECTAL | Status: DC | PRN

## 2022-09-10 MED ORDER — ENOXAPARIN SODIUM 40 MG/0.4ML IJ SOSY
40.0000 mg | PREFILLED_SYRINGE | INTRAMUSCULAR | Status: DC
  Administered 2022-09-10 – 2022-09-13 (×4): 40 mg via SUBCUTANEOUS
  Filled 2022-09-10 (×4): qty 0.4

## 2022-09-10 MED ORDER — INSULIN ASPART PROT & ASPART (70-30 MIX) 100 UNIT/ML ~~LOC~~ SUSP
20.0000 [IU] | Freq: Two times a day (BID) | SUBCUTANEOUS | Status: DC
  Administered 2022-09-10 – 2022-09-14 (×9): 20 [IU] via SUBCUTANEOUS
  Filled 2022-09-10 (×2): qty 10

## 2022-09-10 MED ORDER — ESCITALOPRAM OXALATE 20 MG PO TABS
20.0000 mg | ORAL_TABLET | Freq: Every day | ORAL | Status: DC
  Administered 2022-09-11 – 2022-09-14 (×4): 20 mg via ORAL
  Filled 2022-09-10 (×4): qty 2

## 2022-09-10 MED ORDER — STROKE: EARLY STAGES OF RECOVERY BOOK
Freq: Once | Status: AC
  Filled 2022-09-10: qty 1

## 2022-09-10 MED ORDER — ASPIRIN 81 MG PO TBEC
81.0000 mg | DELAYED_RELEASE_TABLET | Freq: Every day | ORAL | Status: DC
  Administered 2022-09-11 – 2022-09-14 (×4): 81 mg via ORAL
  Filled 2022-09-10 (×4): qty 1

## 2022-09-10 MED ORDER — MELATONIN 3 MG PO TABS: 3.0000 mg | ORAL_TABLET | Freq: Every evening | ORAL | Status: DC | PRN

## 2022-09-10 MED ORDER — ACETAMINOPHEN 160 MG/5ML PO SOLN: 650.0000 mg | ORAL | Status: DC | PRN

## 2022-09-10 MED ORDER — TAMOXIFEN CITRATE 10 MG PO TABS
20.0000 mg | ORAL_TABLET | Freq: Every day | ORAL | Status: DC
  Administered 2022-09-11 – 2022-09-14 (×4): 20 mg via ORAL
  Filled 2022-09-10 (×4): qty 2

## 2022-09-10 MED ORDER — SODIUM CHLORIDE 0.9 % IV SOLN: Freq: Once | INTRAVENOUS | Status: AC

## 2022-09-10 NOTE — Progress Notes (Signed)
PT Cancellation Note  Patient Details Name: HANNALEE CASTOR MRN: [id number] DOB: Dec 21, 1950   Cancelled Treatment:    Reason Eval/Treat Not Completed: Fatigue/lethargy limiting ability to participate. Pt reports too fatigued for PT evaluation after working with OT, will continue to follow and evaluate as time/schedule allows, likely tomorrow morning.   Vickki Muff, PT, DPT   Acute Rehabilitation Department Office [phone] Secure Chat Communication Preferred   Ronnie Derby 09/10/2022, 3:57 PM

## 2022-09-10 NOTE — ED Notes (Signed)
Patient transported to CT 

## 2022-09-10 NOTE — H&P (Addendum)
History and Physical   [name]:[id number] DOB: [birth year] DOA: 09/10/2022  PCP: Laurann Montana, MD   Patient coming from: Home  Chief Complaint: Right-sided weakness  HPI: [name] is a 72 y.o. female with medical history significant of neuropathy, diabetes, stroke, CKD 3A, hypertension, hyperlipidemia, DCIS, obesity, depression, OSA presenting with right-sided weakness.  Patient recently admitted for stroke a month ago for several days and then was in rehab for almost 2 weeks.  Returned home about 2 weeks ago.  Last night patient was last normal at 6 PM around dinnertime.  After dinner she went and sat in recliner.  When she tried to get up from the recliner around 10 PM she noted new right-sided weakness in her arm and leg.  Her family helped her to bed at the time.  Overnight she had to get up to go to the bathroom and she noticed continued weakness and this is when family called EMS to bring patient to the ED.  Denies fevers, chills, chest pain, shortness breath, abdominal pain, constipation, diarrhea, nausea, vomiting.  ED Course: Vital signs in the ED notable for blood pressure in the 150s to 170 systolic.  Lab workup included CMP with sodium 133, BUN 32, creatinine 1.82 which is near baseline of 1.6-1.8, glucose 155, albumin 3.1.  CBC with hemoglobin stable at 10.2.  PT and INR normal.  PTT mildly elevated at 53.  Ethanol level negative.  UDS negative.  Urinalysis with nitrites, leukocytes, bacteria.  Chest x-ray showed no acute normality.  CT head showed no acute normality but demonstrated previous chronic changes.  MRI showed new acute infarct that was not present on previous imaging.  Patient received IV fluids in the ED and neurology was consulted.  Review of Systems: As per HPI otherwise all other systems reviewed and are negative.  Past Medical History:  Diagnosis Date   AKI (acute kidney injury) (HCC) 08/09/2022   Breast cancer (HCC) 12/2019   left breast DCIS    CKD (chronic kidney disease) 06/07/2016   Stage 2, GFR 60-89 ml/min   Diabetic polyneuropathy 06/07/2016   Dyslipidemia    Endometrial adenocarcinoma 2012   Fever blister    Hyperlipidemia    Hypertension, essential 06/07/2016   Major depressive disorder    Mixed dyslipidemia 06/07/2016   MRSA infection 10/2019   left foot wound   Obstructive sleep apnea 06/07/2016   does not use CPAP   Peripheral neuropathy    Retinopathy    Right rib fracture 08/09/2022   Severe obesity    Skin ulcer of left foot with fat layer exposed 11/02/2016   Stroke    Asymptomatic, discovered via neuroimaging; small infarct in left parietal lobe; also concern for small b/l frontal infarcts   Tachycardia 07/19/2017   Traumatic rhabdomyolysis (HCC) 08/09/2022   Type 2 diabetes mellitus with hyperglycemia, with long-term current use of insulin 06/07/2016   Unilateral primary osteoarthritis, right knee 04/16/2019   UTI (urinary tract infection) 08/09/2022    Past Surgical History:  Procedure Laterality Date   ADRENALECTOMY     BREAST LUMPECTOMY WITH RADIOACTIVE SEED LOCALIZATION Left 02/25/2020   Procedure: LEFT BREAST LUMPECTOMY WITH RADIOACTIVE SEED LOCALIZATION;  Surgeon: Abigail Miyamoto, MD;  Location:  SURGERY CENTER;  Service: General;  Laterality: Left;   BUNIONECTOMY     CARDIAC CATHETERIZATION     CERVICAL SPINE SURGERY     CHOLECYSTECTOMY     DILATION AND CURETTAGE OF UTERUS     LAPAROSCOPIC SALPINGOOPHERECTOMY  THORACOTOMY     TONSILLECTOMY      Social History  reports that she has never smoked. She has never used smokeless tobacco. She reports that she does not drink alcohol and does not use drugs.  Allergies  Allergen Reactions   Elavil [Amitriptyline] Other (See Comments)    Unknown reaction   Pristiq [Desvenlafaxine] Other (See Comments)    Unknown allergy   Cipro [Ciprofloxacin Hcl] Rash   Penicillins Rash   Sulfa Antibiotics Rash   Sulfamethoxazole Rash    Sulfur Rash    Family History  Problem Relation Age of Onset   Hypertension Mother    Alzheimer's disease Mother    Heart attack Father    CAD Father    Alzheimer's disease Father    Bladder Cancer Father        dx late 62s   Diverticulitis Sister    Obesity Sister    Hypertension Sister    Voice disorder Brother    Breast cancer Paternal Aunt 86   Prostate cancer Paternal Uncle        dx mid 80s  Reviewed on admission  Prior to Admission medications   Medication Sig Start Date End Date Taking? Authorizing Provider  acetaminophen (TYLENOL) 325 MG tablet Take 2 tablets (650 mg total) by mouth every 6 (six) hours as needed for mild pain, fever or headache. 08/24/22   Angiulli, Mcarthur Rossetti, PA-C  amLODipine (NORVASC) 10 MG tablet Take 1 tablet (10 mg total) by mouth daily. 08/24/22   Angiulli, Mcarthur Rossetti, PA-C  ascorbic acid (VITAMIN C) 500 MG tablet Take 1 tablet (500 mg total) by mouth daily. 08/24/22   Angiulli, Mcarthur Rossetti, PA-C  aspirin EC 81 MG tablet Take 1 tablet (81 mg total) by mouth daily. Swallow whole. 08/13/22   Narda Bonds, MD  atorvastatin (LIPITOR) 80 MG tablet Take 1 tablet (80 mg total) by mouth daily. 08/24/22   Angiulli, Mcarthur Rossetti, PA-C  buPROPion (WELLBUTRIN XL) 300 MG 24 hr tablet Take 1 tablet (300 mg total) by mouth daily. 08/24/22   Angiulli, Mcarthur Rossetti, PA-C  carvedilol (COREG) 6.25 MG tablet Take 1 tablet (6.25 mg total) by mouth 2 (two) times daily. 08/24/22   Angiulli, Mcarthur Rossetti, PA-C  clopidogrel (PLAVIX) 75 MG tablet Take 1 tablet (75 mg total) by mouth daily. 08/24/22   Angiulli, Mcarthur Rossetti, PA-C  doxycycline (VIBRA-TABS) 100 MG tablet Take 1 tablet (100 mg total) by mouth every 12 (twelve) hours. 08/24/22   Angiulli, Mcarthur Rossetti, PA-C  escitalopram (LEXAPRO) 20 MG tablet Take 1 tablet (20 mg total) by mouth daily. 08/24/22   Angiulli, Mcarthur Rossetti, PA-C  insulin NPH-regular Human (NOVOLIN 70/30) (70-30) 100 UNIT/ML injection Inject 24-28 Units into the skin See admin instructions. 24  units in the morning, 28 units in the evening    [provider]  losartan (COZAAR) 25 MG tablet Take 1 tablet (25 mg total) by mouth every morning. 08/24/22   Angiulli, Mcarthur Rossetti, PA-C  melatonin 3 MG TABS tablet Take 1 tablet (3 mg total) by mouth at bedtime as needed. 08/24/22   Angiulli, Mcarthur Rossetti, PA-C  metFORMIN (GLUCOPHAGE) 1000 MG tablet Take 1 tablet (1,000 mg total) by mouth 2 (two) times daily with a meal. 08/24/22   [name], DO  metFORMIN (GLUCOPHAGE) 500 MG tablet Take 1 tablet (500 mg total) by mouth 2 (two) times daily with a meal. 08/24/22   Angiulli, Mcarthur Rossetti, PA-C  tamoxifen (NOLVADEX) 20 MG tablet  TAKE ONE TABLET BY MOUTH EVERY MORNING Patient taking differently: Take 20 mg by mouth daily. 07/21/22   Serena Croissant, MD  Zinc Sulfate 220 (50 Zn) MG TABS Take 1 tablet (220 mg total) by mouth daily. 08/24/22   Charlton Amor, PA-C    Physical Exam: Vitals:   09/10/22 0653 09/10/22 0900 09/10/22 1030 09/10/22 1100  BP:  (!) 170/68 (!) 164/57 (!) 166/62  Pulse:  66 66 65  Resp:  13 13 14   Temp: 98.7 F (37.1 C)   97.9 F (36.6 C)  TempSrc:      SpO2:  98% 100% 98%  Weight:      Height:        Physical Exam Constitutional:      General: She is not in acute distress.    Appearance: Normal appearance.  HENT:     Head: Normocephalic and atraumatic.     Mouth/Throat:     Mouth: Mucous membranes are moist.     Pharynx: Oropharynx is clear.  Eyes:     Extraocular Movements: Extraocular movements intact.     Pupils: Pupils are equal, round, and reactive to light.  Cardiovascular:     Rate and Rhythm: Normal rate and regular rhythm.     Pulses: Normal pulses.     Heart sounds: Normal heart sounds.  Pulmonary:     Effort: Pulmonary effort is normal. No respiratory distress.     Breath sounds: Normal breath sounds.  Abdominal:     General: Bowel sounds are normal. There is no distension.     Palpations: Abdomen is soft.     Tenderness: There is no abdominal  tenderness.  Musculoskeletal:        General: No swelling or deformity.  Skin:    General: Skin is warm and dry.  Neurological:     Comments: Mental Status: Patient is awake, alert, oriented x3 No signs of aphasia or neglect Cranial Nerves: II: Pupils equal, round, and reactive to light.   III,IV, VI: EOMI without ptosis or diploplia.  V: Facial sensation is symmetric to light touch. VII: Facial movement is symmetric.  VIII: hearing is intact to voice X: Uvula elevates symmetrically XI: Shoulder shrug is symmetric. XII: tongue is midline without atrophy or fasciculations.  Motor: Good effort thorughout, 4-6/5 RUE, 5/5 LUE, 3-4/5 RLE, 5/5 LLE.Sensory: Sensation is grossly intact bilateral UEs & LEs Cerebellar: Finger-Nose slower on R, possible due to decreased strength vs arm restricted by IV and BP cuff.    Labs on Admission: I have personally reviewed following labs and imaging studies  CBC: Recent Labs  Lab 09/10/22 0527  WBC 8.0  NEUTROABS 4.9  HGB 10.2*  HCT 31.1*  MCV 98.4  PLT 272    Basic Metabolic Panel: Recent Labs  Lab 09/10/22 0527  NA 133*  K 4.3  CL 101  CO2 22  GLUCOSE 155*  BUN 32*  CREATININE 1.82*  CALCIUM 8.9    GFR: Estimated Creatinine Clearance: 36.2 mL/min (A) (by C-G formula based on SCr of 1.82 mg/dL (H)).  Liver Function Tests: Recent Labs  Lab 09/10/22 0527  AST 18  ALT 18  ALKPHOS 42  BILITOT 0.7  PROT 6.9  ALBUMIN 3.1*    Urine analysis:    Component Value Date/Time   COLORURINE YELLOW 09/10/2022 0659   APPEARANCEUR CLEAR 09/10/2022 0659   LABSPEC 1.006 09/10/2022 0659   PHURINE 5.0 09/10/2022 0659   GLUCOSEU NEGATIVE 09/10/2022 0659   HGBUR NEGATIVE 09/10/2022 4098  BILIRUBINUR NEGATIVE 09/10/2022 0659   KETONESUR NEGATIVE 09/10/2022 0659   PROTEINUR NEGATIVE 09/10/2022 0659   UROBILINOGEN 0.2 03/24/2010 0940   NITRITE POSITIVE (A) 09/10/2022 0659   LEUKOCYTESUR TRACE (A) 09/10/2022 0659    Radiological  Exams on Admission: MR BRAIN WO CONTRAST  Result Date: 09/10/2022 CLINICAL DATA:  Neuro deficit, acute, stroke suspected EXAM: MRI HEAD WITHOUT CONTRAST TECHNIQUE: Multiplanar, multiecho pulse sequences of the brain and surrounding structures were obtained without intravenous contrast. COMPARISON:  MR Brain 08/08/22 FINDINGS: Brain: Acute infarcts in the bilateral corona radiata and in the left parietal lobe, new compared to 08/08/2022. there is chronic infarct in the posterior left parietal lobe, posterior right parietal lobe, and in the right cerebellum. No hemorrhage. No extra-axial fluid collection. No hydrocephalus. There is sequela of severe chronic microvascular ischemic change. Vascular: Normal flow voids. Skull and upper cervical spine: Normal marrow signal. Sinuses/Orbits: No middle ear or mastoid effusion. Paranasal sinuses are clear. Bilateral lens replacement. Orbits are otherwise unremarkable Other: None. IMPRESSION: Acute infarcts in the bilateral corona radiata and in the left parietal lobe, new compared to 08/08/2022. No hemorrhage. Electronically Signed   By: Lorenza Cambridge M.D.   On: 09/10/2022 12:00   DG Chest 1 View  Result Date: 09/10/2022 CLINICAL DATA:  Right-sided weakness.  Right-sided chest pain. EXAM: CHEST  1 VIEW COMPARISON:  08/08/2022 FINDINGS: The lungs are clear without focal pneumonia, edema, pneumothorax or pleural effusion. The cardiopericardial silhouette is within normal limits for size. No acute bony abnormality. Telemetry leads overlie the chest. IMPRESSION: No active disease. Electronically Signed   By: Kennith Center M.D.   On: 09/10/2022 09:04   CT Head Wo Contrast  Result Date: 09/10/2022 CLINICAL DATA:  History follow-up.  Abnormal mental status. EXAM: CT HEAD WITHOUT CONTRAST TECHNIQUE: Contiguous axial images were obtained from the base of the skull through the vertex without intravenous contrast. RADIATION DOSE REDUCTION: This exam was performed according to  the departmental dose-optimization program which includes automated exposure control, adjustment of the mA and/or kV according to patient size and/or use of iterative reconstruction technique. COMPARISON:  Brain MRI 08/08/2022 FINDINGS: Brain: No evidence of acute infarction, hemorrhage, hydrocephalus, extra-axial collection or mass lesion/mass effect. Chronic bifrontal and left parietal cortex infarcts. Small vessel infarcts in the deep gray nuclei, reference recent brain MRI showing areas of acuity. Vascular: No hyperdense vessel or unexpected calcification. Skull: Normal. Negative for fracture or focal lesion. Sinuses/Orbits: No acute finding. IMPRESSION: 1. No acute or reversible finding. 2. Chronic and subacute infarcts without noted progression from brain MRI 08/08/2022 Electronically Signed   By: Tiburcio Pea M.D.   On: 09/10/2022 08:39    EKG: Independently reviewed.  Sinus rhythm 62 bpm.  QTc borderline at 493.  Nonspecific T wave flattening.  Assessment/Plan Principal Problem:   Acute CVA (cerebrovascular accident) (HCC) Active Problems:   Type 2 diabetes mellitus with hyperglycemia, with long-term current use of insulin   Diabetic peripheral neuropathy (HCC)   Hypertension, essential   Obstructive sleep apnea   Mixed hyperlipidemia   Morbid (severe) obesity due to excess calories (HCC)   CKD stage 3a, GFR 45-59 ml/min (HCC)   History of CVA (cerebrovascular accident)   Major depressive disorder, recurrent episode, moderate (HCC)   CVA History of CVA > Presenting with right-sided weakness found to have new acute infarct.  Was admitted last month for cryptogenic CVA as well. > CT head showed only old infarct.  MR brain did show new acute infarct.  Ethanol level negative, UDS negative, urinalysis positive as below. > Neurology consulted in the ED and will see the patient. - Neurology consult - Allow for permissive HTN (systolic < 220 and diastolic < 120) - Continue home aspirin,  Plavix, statin - Hold off on repeat echocardiogram, Carotid doppler, CTA head & neck, A1C, Lipid panel as this was recently done - Tele monitoring  - SLP eval - PT/OT  Bacteriuria > Patient did incidentally have urinalysis with nitrates, leukocytes, bacteria.  No symptoms.  Suspect asymptomatic bacteriuria. - Follow-up urine culture - Holding off on antibiotics for now  CKD 3a > Creatinine mildly elevated from baseline at 1.8 from baseline closer to 1.6. > Already receiving IV fluids in the ED. - Trend renal function and electrolytes  Diabetes > 22-24U 70/30 insulin at home BID - Continue home 70/30 insulin at 20 units twice daily - SSI  Hypertension - Holding home antihypertensives in the setting of permissive hypertension as above  Hyperlipidemia - Continue home atorvastatin as above  Depression - Continue home Wellbutrin and Lexapro  OSA - Not on CPAP  History of DCIS > Follows with oncology.  Status post lumpectomy and XRT. - Continue home tamoxifen  Obesity - Noted  DVT prophylaxis: Lovenox Code Status:   Full Family Communication:  Updated at bedside  Disposition Plan:   Patient is from:  Home  Anticipated DC to:  Pending need for rehab  Anticipated DC date:  1 to 3 days  Anticipated DC barriers: Pending possible rehab needs  Consults called:  Neurology consulted in the ED Admission status:  Observation, telemetry  Severity of Illness: The appropriate patient status for this patient is OBSERVATION. Observation status is judged to be reasonable and necessary in order to provide the required intensity of service to ensure the patient's safety. The patient's presenting symptoms, physical exam findings, and initial radiographic and laboratory data in the context of their medical condition is felt to place them at decreased risk for further clinical deterioration. Furthermore, it is anticipated that the patient will be medically stable for discharge from the  hospital within 2 midnights of admission.    [name] MD [facility]  How to contact the [facility] Attending or Consulting provider 7A - 7P or covering provider during after hours 7P -7A, for this patient?   Check the care team in [facility] and look for a) attending/consulting TR[facility] provider listed and b) the [facility] team listed Log into [email or web address] and use [place]'s universal password to access. If you do not have the password, please contact the hospital operator. Locate the [facility] provider you are looking for under [facility] and page to a number that you can be directly reached. If you still have difficulty reaching the provider, please page the [facility] (Director on Call) for the Hospitalists listed on amion for assistance.  09/10/2022, 12:47 PM

## 2022-09-10 NOTE — ED Triage Notes (Signed)
Pt family reports last known well at 10 pm. Pt has some right sided weakness. Pt was unable to get from bed to bedside commode then complained of right sided weakness and ems was called.  Cbg 164 Bp 168/110 Hr 74 Spo2 98% on ra

## 2022-09-10 NOTE — ED Provider Notes (Signed)
Emergency Department Provider Note   I have reviewed the triage vital signs and the nursing notes.   HISTORY  Chief Complaint Extremity Weakness (RIGHT) and Transient Ischemic Attack   HPI [name] is a 72 y.o. female with PMH of HTN, HLD, recent CVA presents to the ED with acute onset right arm/leg weakness. Patient was last normal at 6:30 PM yesterday. She got up from the dinner table and walked to her recliner chair. When she tried to get up and go to bed at 10 PM, she felt new weakness in the right leg and right arm. Family helped her to bed. No HA, vision change, or numbness. She had to get up to go to the bathroom in the night and felt continued symptoms at which point family called EMS.    Past Medical History:  Diagnosis Date   Breast cancer (HCC) 12/2019   left breast DCIS   CKD (chronic kidney disease) 06/07/2016   Stage 2, GFR 60-89 ml/min   Diabetic polyneuropathy 06/07/2016   Dyslipidemia    Endometrial adenocarcinoma 2012   Fever blister    Hyperlipidemia    Hypertension, essential 06/07/2016   Major depressive disorder    Mixed dyslipidemia 06/07/2016   MRSA infection 10/2019   left foot wound   Obstructive sleep apnea 06/07/2016   does not use CPAP   Peripheral neuropathy    Retinopathy    Severe obesity    Skin ulcer of left foot with fat layer exposed 11/02/2016   Stroke    Asymptomatic, discovered via neuroimaging; small infarct in left parietal lobe; also concern for small b/l frontal infarcts   Tachycardia 07/19/2017   Type 2 diabetes mellitus with hyperglycemia, with Dakia Schifano-term current use of insulin 06/07/2016   Unilateral primary osteoarthritis, right knee 04/16/2019    Review of Systems  Constitutional: No fever/chills Cardiovascular: Denies chest pain. Respiratory: Denies shortness of breath. Gastrointestinal: No abdominal pain.  Genitourinary: Negative for dysuria. Musculoskeletal: Negative for back pain. Skin: Negative for  rash. Neurological: Negative for headaches and numbness. Positive right side weakness.   ____________________________________________   PHYSICAL EXAM:  VITAL SIGNS: ED Triage Vitals  Enc Vitals Group     BP 09/10/22 0525 (!) 167/62     Pulse Rate 09/10/22 0525 62     Resp 09/10/22 0525 13     Temp --      Temp Source 09/10/22 0525 Oral     SpO2 09/10/22 0525 100 %     Weight 09/10/22 0521 227 lb (103 kg)     Height 09/10/22 0521 5\' 9"  (1.753 m)     Head Circumference --      Peak Flow --      Pain Score 09/10/22 0521 0     Pain Loc --      Pain Edu? --      Excl. in GC? --    {** Revise as appropriate then delete this line - 8 systems required **} Constitutional: Alert and oriented. Well appearing and in no acute distress. Eyes: Conjunctivae are normal. PERRL. EOMI. Head: Atraumatic. {**Ears:  Healthy appearing ear canals and TMs bilaterally **}Nose: No congestion/rhinnorhea. Mouth/Throat: Mucous membranes are moist.  Oropharynx non-erythematous. Neck: No stridor.  No meningeal signs.  {**No cervical spine tenderness to palpation.**} Cardiovascular: Normal rate, regular rhythm. Good peripheral circulation. Grossly normal heart sounds.   Respiratory: Normal respiratory effort.  No retractions. Lungs CTAB. Gastrointestinal: Soft and nontender. No distention.  {**Genitourinary:  **}Musculoskeletal: No lower  extremity tenderness nor edema. No Moctezuma deformities of extremities. Neurologic:  Normal speech and language. No Mendizabal focal neurologic deficits are appreciated.  Skin:  Skin is warm, dry and intact. No rash noted. {**Psychiatric: Mood and affect are normal. Speech and behavior are normal.**}  ____________________________________________   LABS (all labs ordered are listed, but only abnormal results are displayed)  Labs Reviewed  ETHANOL  PROTIME-INR  APTT  CBC  DIFFERENTIAL  COMPREHENSIVE METABOLIC PANEL  RAPID URINE DRUG SCREEN, HOSP PERFORMED  URINALYSIS,  ROUTINE W REFLEX MICROSCOPIC  I-STAT CHEM 8, ED   ____________________________________________  EKG   EKG Interpretation  Date/Time:  Saturday September 10 2022 05:25:23 EDT Ventricular Rate:  62 PR Interval:  155 QRS Duration: 109 QT Interval:  485 QTC Calculation: 493 R Axis:   7 Text Interpretation: Sinus rhythm Borderline prolonged QT interval Confirmed by Alona Bene [phone]) on 09/10/2022 5:28:19 AM        ____________________________________________  RADIOLOGY  No results found.  ____________________________________________   PROCEDURES  Procedure(s) performed:   Procedures   ____________________________________________   INITIAL IMPRESSION / ASSESSMENT AND PLAN / ED COURSE  Pertinent labs & imaging results that were available during my care of the patient were reviewed by me and considered in my medical decision making (see chart for details).   This patient is Presenting for Evaluation of ***, which {Range:23949} require a range of treatment options, and {MDMcomplaint:23950} a complaint that involves a {MDMlevelrisk:23951} risk of morbidity and mortality.  The Differential Diagnoses include***.  Critical Interventions-    Medications - No data to display  Reassessment after intervention:     I *** Additional Historical Information from ***, as the patient is ***.  I decided to review pertinent External Data, and in summary ***.   Clinical Laboratory Tests Ordered, included   Radiologic Tests Ordered, included ***. I independently interpreted the images and agree with radiology interpretation.   Cardiac Monitor Tracing which shows ***   Social Determinants of Health Risk ***  Consult complete with  Medical Decision Making: Summary: ***  Reevaluation with update and discussion with   ***Considered admission***  Patient's presentation is most consistent with {EM COPA:27473}   Disposition:    ____________________________________________  FINAL CLINICAL IMPRESSION(S) / ED DIAGNOSES  Final diagnoses:  None     NEW OUTPATIENT MEDICATIONS STARTED DURING THIS VISIT:  New Prescriptions   No medications on file    Note:  This document was prepared using Dragon voice recognition software and may include unintentional dictation errors.  Alona Bene, MD, [facility] Emergency Medicine

## 2022-09-10 NOTE — ED Provider Notes (Signed)
Patient seen after prior EDP.  MRI demonstrates evidence of new CVA.  Neurology is aware of case and will consult.  Patient will require admission for additional workup / treatment.   Wynetta Fines, MD 09/10/22 1226

## 2022-09-10 NOTE — ED Notes (Signed)
Patient transported to MRI 

## 2022-09-10 NOTE — Evaluation (Signed)
Occupational Therapy Evaluation Patient Details Name: [name] MRN: [id number] DOB: 1950/04/03 Today's Date: 09/10/2022   History of Present Illness [name] is a 72 y.o. female with medical history significant of neuropathy, diabetes, stroke, CKD 3A, hypertension, hyperlipidemia, DCIS, obesity, depression, OSA presenting with right-sided weakness. MRI showed new acute infarcts in the bilateral corona radiata and in the left parietal lobe.   Clinical Impression   Pt d/c from rehab to home about 2 weeks ago. She reports she was ambulating modified independently with use of her rollator and was independent with ADL. Family assisted with IADL and donning TED hose. Pt currently demonstrates RLE/RUE weakness and decreased coordination, instability, decreased endurance and decreased activity tolerance impacting independence with self-care. She currently requires modA to powerup into standing from EOB and modA for LB dressing. Pt with heavy reliance on UE support in standing and requiring minA for lateral side stepping along EOB. Will continue to follow acutely and progress appropriately. Patient will benefit from intensive inpatient follow up therapy, >3 hours/day.      Recommendations for follow up therapy are one component of a multi-disciplinary discharge planning process, led by the attending physician.  Recommendations may be updated based on patient status, additional functional criteria and insurance authorization.   Assistance Recommended at Discharge Intermittent Supervision/Assistance  Patient can return home with the following A little help with walking and/or transfers;A little help with bathing/dressing/bathroom    Functional Status Assessment  Patient has had a recent decline in their functional status and demonstrates the ability to make significant improvements in function in a reasonable and predictable amount of time.  Equipment Recommendations  Other (comment) (tbd)     Recommendations for Other Services PT consult     Precautions / Restrictions Precautions Precautions: Fall Restrictions Weight Bearing Restrictions: No      Mobility Bed Mobility Overal bed mobility: Needs Assistance Bed Mobility: Supine to Sit, Sit to Supine     Supine to sit: Mod assist Sit to supine: Mod assist   General bed mobility comments: modA for trunk progression to sitting;modA for return to supine for assist with BLE    Transfers Overall transfer level: Needs assistance Equipment used: Rolling walker (2 wheels) Transfers: Sit to/from Stand Sit to Stand: Mod assist           General transfer comment: modA to powerup into standing from elevated surface. Pt marching in place x10 and taking lateral side steps along EOB with minA for safety and stability. No buckling noted. RLE weaker than LLE      Balance Overall balance assessment: Needs assistance Sitting-balance support: Single extremity supported, Feet supported Sitting balance-Leahy Scale: Fair Sitting balance - Comments: reliant on UE support for dynamic stability. pt with slight right lateral lean and occassional posterior bias, able to self correct;no loss of balance Postural control: Posterior lean, Right lateral lean Standing balance support: Bilateral upper extremity supported, During functional activity Standing balance-Leahy Scale: Poor Standing balance comment: heavy reliance on BUE support                           ADL either performed or assessed with clinical judgement   ADL Overall ADL's : Needs assistance/impaired Eating/Feeding: Set up;Sitting   Grooming: Set up;Sitting   Upper Body Bathing: Set up;Sitting   Lower Body Bathing: Minimal assistance;Sit to/from stand   Upper Body Dressing : Set up;Sitting   Lower Body Dressing: Moderate assistance;Sit to/from stand  Toilet Transfer Details (indicate cue type and reason): pt taking lateral side steps along EOB  with minA, deferred further mobility secondary to fatigue Toileting- Clothing Manipulation and Hygiene: Moderate assistance;Sit to/from stand Toileting - Clothing Manipulation Details (indicate cue type and reason): assist due to heavy reliance on BUE support in standing     Functional mobility during ADLs: Minimal assistance;+2 for safety/equipment;Rolling walker (2 wheels) General ADL Comments: pt limited by decreased endurance, decreased activity tolerance, instability and LE weakness     Vision Baseline Vision/History: 1 Wears glasses;4 Cataracts Ability to See in Adequate Light: 0 Adequate Patient Visual Report: No change from baseline Vision Assessment?: No apparent visual deficits     Perception     Praxis      Pertinent Vitals/Pain Pain Assessment Pain Assessment: Faces Faces Pain Scale: Hurts a little bit Breathing: normal Negative Vocalization: none Facial Expression: smiling or inexpressive Body Language: relaxed Consolability: no need to console PAINAD Score: 0 Pain Location: bilateral feet Pain Descriptors / Indicators: Grimacing Pain Intervention(s): Limited activity within patient's tolerance, Monitored during session     Hand Dominance Right   Extremity/Trunk Assessment Upper Extremity Assessment Upper Extremity Assessment: RUE deficits/detail RUE Deficits / Details: grossly 4/5 vs LUE 5/5; decreased fine motor coordination, decreased rapid alternating movement, pt reports sensationis intact RUE Sensation: WNL RUE Coordination: decreased fine motor   Lower Extremity Assessment Lower Extremity Assessment: Defer to PT evaluation;RLE deficits/detail RLE Deficits / Details: grossly 3+/5, hx of peripheral neuropathy pt reports numb up to knees. thigh sensation intact and equal bilaterally. RLE Sensation: history of peripheral neuropathy RLE Coordination: WNL   Cervical / Trunk Assessment Cervical / Trunk Assessment: Normal   Communication  Communication Communication: No difficulties   Cognition Arousal/Alertness: Awake/alert Behavior During Therapy: WFL for tasks assessed/performed Overall Cognitive Status: Within Functional Limits for tasks assessed                                       General Comments  vss on RA    Exercises     Shoulder Instructions      Home Living Family/patient expects to be discharged to:: Private residence Living Arrangements: Other relatives Available Help at Discharge: Family;Available 24 hours/day Type of Home: House (Plans to discharge to sister's home) Home Access: Stairs to enter;Other (comment) Entrance Stairs-Number of Steps: 5 Entrance Stairs-Rails: Right;Left Home Layout: One level     Bathroom Shower/Tub: Walk-in shower;Other (comment);Tub/shower unit   Bathroom Toilet: Handicapped height Bathroom Accessibility: Yes How Accessible: Accessible via walker Home Equipment: Rollator (4 wheels);Tub bench          Prior Functioning/Environment Prior Level of Function : Independent/Modified Independent;Driving             Mobility Comments: pt reports she was ambulating independently with a rollator ADLs Comments: tub bench for shower, pt was independent with all ADL except donning ted hose which family would assist with. family also assisted with IADL        OT Problem List: Decreased activity tolerance;Impaired balance (sitting and/or standing);Impaired UE functional use      OT Treatment/Interventions: Self-care/ADL training;Therapeutic exercise;Energy conservation;DME and/or AE instruction;Patient/family education;Balance training    OT Goals(Current goals can be found in the care plan section) Acute Rehab OT Goals Patient Stated Goal: to get stronger and return home OT Goal Formulation: With patient Time For Goal Achievement: 09/24/22 Potential to Achieve Goals: Good ADL  Goals Pt Will Perform Grooming: with modified  independence;standing Pt Will Perform Lower Body Dressing: with modified independence;sit to/from stand Pt Will Transfer to Toilet: with modified independence;ambulating Pt/caregiver will Perform Home Exercise Program: Increased strength;Right Upper extremity;Independently  OT Frequency: Min 2X/week    Co-evaluation              AM-PAC OT "6 Clicks" Daily Activity     Outcome Measure Help from another person eating meals?: A Little Help from another person taking care of personal grooming?: A Little Help from another person toileting, which includes using toliet, bedpan, or urinal?: A Lot Help from another person bathing (including washing, rinsing, drying)?: A Little Help from another person to put on and taking off regular upper body clothing?: A Little Help from another person to put on and taking off regular lower body clothing?: A Lot 6 Click Score: 16   End of Session Equipment Utilized During Treatment: Rolling walker (2 wheels);Gait belt Nurse Communication: Mobility status  Activity Tolerance: Patient tolerated treatment well Patient left: in bed;with call bell/phone within reach;with bed alarm set;with family/visitor present  OT Visit Diagnosis: Other abnormalities of gait and mobility (R26.89);Muscle weakness (generalized) (M62.81)                Time: 0981-1914 OT Time Calculation (min): 30 min Charges:  OT General Charges $OT Visit: 1 Visit OT Evaluation $OT Eval Moderate Complexity: 1 Mod OT Treatments $Self Care/Home Management : 8-22 mins  Rosey Bath OTR/L Acute Rehabilitation Services Office: [phone]   Rebeca Alert 09/10/2022, 4:17 PM

## 2022-09-10 NOTE — ED Notes (Signed)
ED TO INPATIENT HANDOFF REPORT  ED Nurse Name and Phone #: Tori [phone]  S Name/Age/Gender [name] 72 y.o. female Room/Bed: TRAAC/TRAAC  Code Status   Code Status: Full Code  Home/SNF/Other Home Patient oriented to: self, place, time, and situation Is this baseline? Yes   Triage Complete: Triage complete  Chief Complaint Acute CVA (cerebrovascular accident) [facility]) [I63.9]  Triage Note Pt family reports last known well at 10 pm. Pt has some right sided weakness. Pt was unable to get from bed to bedside commode then complained of right sided weakness and ems was called.  Cbg 164 Bp 168/110 Hr 74 Spo2 98% on ra    Allergies Allergies  Allergen Reactions   Elavil [Amitriptyline] Other (See Comments)    Unknown reaction   Pristiq [Desvenlafaxine] Other (See Comments)    Unknown allergy   Cipro [Ciprofloxacin Hcl] Rash   Penicillins Rash   Sulfa Antibiotics Rash   Sulfamethoxazole Rash   Sulfur Rash    Level of Care/Admitting Diagnosis ED Disposition     ED Disposition  Admit   Condition  --   Comment  Hospital Area: [facility] [[id number]]  Level of Care: Telemetry Medical [104]  May place patient in observation at [facility] or [facility] if equivalent level of care is available:: No  Covid Evaluation: Asymptomatic - no recent exposure (last 10 days) testing not required  Diagnosis: Acute CVA (cerebrovascular accident) [facility]) [[id number]]  Admitting Physician: [name] [[id number]]  Attending Physician: [name] [[id number]]          B Medical/Surgery History Past Medical History:  Diagnosis Date   AKI (acute kidney injury) (HCC) 08/09/2022   Breast cancer (HCC) 12/2019   left breast DCIS   CKD (chronic kidney disease) 06/07/2016   Stage 2, GFR 60-89 ml/min   Diabetic polyneuropathy 06/07/2016   Dyslipidemia    Endometrial adenocarcinoma 2012   Fever blister    Hyperlipidemia    Hypertension, essential  06/07/2016   Major depressive disorder    Mixed dyslipidemia 06/07/2016   MRSA infection 10/2019   left foot wound   Obstructive sleep apnea 06/07/2016   does not use CPAP   Peripheral neuropathy    Retinopathy    Right rib fracture 08/09/2022   Severe obesity    Skin ulcer of left foot with fat layer exposed 11/02/2016   Stroke    Asymptomatic, discovered via neuroimaging; small infarct in left parietal lobe; also concern for small b/l frontal infarcts   Tachycardia 07/19/2017   Traumatic rhabdomyolysis (HCC) 08/09/2022   Type 2 diabetes mellitus with hyperglycemia, with long-term current use of insulin 06/07/2016   Unilateral primary osteoarthritis, right knee 04/16/2019   UTI (urinary tract infection) 08/09/2022   Past Surgical History:  Procedure Laterality Date   ADRENALECTOMY     BREAST LUMPECTOMY WITH RADIOACTIVE SEED LOCALIZATION Left 02/25/2020   Procedure: LEFT BREAST LUMPECTOMY WITH RADIOACTIVE SEED LOCALIZATION;  Surgeon: Abigail Miyamoto, MD;  Location: [facility];  Service: General;  Laterality: Left;   BUNIONECTOMY     CARDIAC CATHETERIZATION     CERVICAL SPINE SURGERY     CHOLECYSTECTOMY     DILATION AND CURETTAGE OF UTERUS     LAPAROSCOPIC SALPINGOOPHERECTOMY     THORACOTOMY     TONSILLECTOMY       A IV Location/Drains/Wounds Patient Lines/Drains/Airways Status     Active Line/Drains/Airways     Name Placement date Placement time Site Days  Peripheral IV 09/10/22 18 G Left Antecubital 09/10/22  0519  Antecubital  less than 1   Peripheral IV 09/10/22 22 G 1.16" Anterior;Distal;Right;Upper Arm 09/10/22  0548  Arm  less than 1   Wound / Incision (Open or Dehisced) 03/30/20 Diabetic ulcer Left;Posterior Diabetic foot ulcer on submetatarsal surface 03/30/20  --  --  894   Wound / Incision (Open or Dehisced) 08/19/22 Diabetic ulcer Heel Left;Posterior diabetic foot ulcer 08/19/22  --  Heel  22   Wound / Incision (Open or Dehisced) 08/22/22 Foot  Anterior;Right;Lateral burst blister area now shallow pink open wound 08/22/22  0815  Foot  19            Intake/Output Last 24 hours No intake or output data in the 24 hours ending 09/10/22 1255  Labs/Imaging Results for orders placed or performed during the hospital encounter of 09/10/22 (from the past 48 hour(s))  Ethanol     Status: None   Collection Time: 09/10/22  5:27 AM  Result Value Ref Range   Alcohol, Ethyl (B) <10 <10 mg/dL    Comment: (NOTE) Lowest detectable limit for serum alcohol is 10 mg/dL.  For medical purposes only. Performed at [facility], [address] [place], [place] [address]   Protime-INR     Status: None   Collection Time: 09/10/22  5:27 AM  Result Value Ref Range   Prothrombin Time 14.6 11.4 - 15.2 seconds   INR 1.1 0.8 - 1.2    Comment: (NOTE) INR goal varies based on device and disease states. Performed at [facility], [address], [place], [place] [address]   APTT     Status: Abnormal   Collection Time: 09/10/22  5:27 AM  Result Value Ref Range   aPTT 53 (H) 24 - 36 seconds    Comment:        IF BASELINE aPTT IS ELEVATED, SUGGEST PATIENT RISK ASSESSMENT BE USED TO DETERMINE APPROPRIATE ANTICOAGULANT THERAPY. Performed at [facility], [address], [place], [place] [address]   CBC     Status: Abnormal   Collection Time: 09/10/22  5:27 AM  Result Value Ref Range   WBC 8.0 4.0 - 10.5 K/uL   RBC 3.16 (L) 3.87 - 5.11 MIL/uL   Hemoglobin 10.2 (L) 12.0 - 15.0 g/dL   HCT 91.4 (L) 78.2 - 95.6 %   MCV 98.4 80.0 - 100.0 fL   MCH 32.3 26.0 - 34.0 pg   MCHC 32.8 30.0 - 36.0 g/dL   RDW 21.3 08.6 - 57.8 %   Platelets 272 150 - 400 K/uL   nRBC 0.0 0.0 - 0.2 %    Comment: Performed at [facility], [address], [place], [place] [address]  Differential     Status: Abnormal   Collection Time: 09/10/22  5:27 AM  Result Value Ref Range   Neutrophils Relative % 60 %   Neutro Abs 4.9 1.7 - 7.7 K/uL    Lymphocytes Relative 28 %   Lymphs Abs 2.2 0.7 - 4.0 K/uL   Monocytes Relative 9 %   Monocytes Absolute 0.7 0.1 - 1.0 K/uL   Eosinophils Relative 1 %   Eosinophils Absolute 0.1 0.0 - 0.5 K/uL   Basophils Relative 1 %   Basophils Absolute 0.0 0.0 - 0.1 K/uL   Immature Granulocytes 1 %   Abs Immature Granulocytes 0.09 (H) 0.00 - 0.07 K/uL    Comment: Performed at [facility],  [address], [place], [place] [address]  Comprehensive metabolic panel     Status: Abnormal   Collection Time: 09/10/22  5:27 AM  Result Value Ref Range   Sodium 133 (L) 135 - 145 mmol/L   Potassium 4.3 3.5 - 5.1 mmol/L   Chloride 101 98 - 111 mmol/L   CO2 22 22 - 32 mmol/L   Glucose, Bld 155 (H) 70 - 99 mg/dL    Comment: Glucose reference range applies only to samples taken after fasting for at least 8 hours.   BUN 32 (H) 8 - 23 mg/dL   Creatinine, Ser 6.57 (H) 0.44 - 1.00 mg/dL   Calcium 8.9 8.9 - 84.6 mg/dL   Total Protein 6.9 6.5 - 8.1 g/dL   Albumin 3.1 (L) 3.5 - 5.0 g/dL   AST 18 15 - 41 U/L   ALT 18 0 - 44 U/L   Alkaline Phosphatase 42 38 - 126 U/L   Total Bilirubin 0.7 0.3 - 1.2 mg/dL   GFR, Estimated 29 (L) >60 mL/min    Comment: (NOTE) Calculated using the CKD-EPI Creatinine Equation (2021)    Anion gap 10 5 - 15    Comment: Performed at [facility], [address], [place], [place] [address]  Urine rapid drug screen (hosp performed)     Status: None   Collection Time: 09/10/22  6:59 AM  Result Value Ref Range   Opiates NONE DETECTED NONE DETECTED   Cocaine NONE DETECTED NONE DETECTED   Benzodiazepines NONE DETECTED NONE DETECTED   Amphetamines NONE DETECTED NONE DETECTED   Tetrahydrocannabinol NONE DETECTED NONE DETECTED   Barbiturates NONE DETECTED NONE DETECTED    Comment: (NOTE) DRUG SCREEN FOR MEDICAL PURPOSES ONLY.  IF CONFIRMATION IS NEEDED FOR ANY PURPOSE, NOTIFY LAB WITHIN 5 DAYS.  LOWEST DETECTABLE LIMITS FOR URINE DRUG SCREEN Drug Class                      Cutoff (ng/mL) Amphetamine and metabolites    1000 Barbiturate and metabolites    200 Benzodiazepine                 200 Opiates and metabolites        300 Cocaine and metabolites        300 THC                            50 Performed at [facility], [address], [place], [place] [address]   Urinalysis, Routine w reflex microscopic -Urine, Clean Catch     Status: Abnormal   Collection Time: 09/10/22  6:59 AM  Result Value Ref Range   Color, Urine YELLOW YELLOW   APPearance CLEAR CLEAR   Specific Gravity, Urine 1.006 1.005 - 1.030   pH 5.0 5.0 - 8.0   Glucose, UA NEGATIVE NEGATIVE mg/dL   Hgb urine dipstick NEGATIVE NEGATIVE   Bilirubin Urine NEGATIVE NEGATIVE   Ketones, ur NEGATIVE NEGATIVE mg/dL   Protein, ur NEGATIVE NEGATIVE mg/dL   Nitrite POSITIVE (A) NEGATIVE   Leukocytes,Ua TRACE (A) NEGATIVE   RBC / HPF 0-5 0 - 5 RBC/hpf   WBC, UA 6-10 0 - 5 WBC/hpf   Bacteria, UA RARE (A) NONE SEEN   Squamous Epithelial / HPF 0-5 0 - 5 /HPF   Mucus PRESENT     Comment: Performed at [facility], [address], [place], [place] [address]  MR BRAIN WO CONTRAST  Result Date: 09/10/2022 CLINICAL DATA:  Neuro deficit, acute, stroke suspected EXAM: MRI HEAD WITHOUT CONTRAST TECHNIQUE: Multiplanar, multiecho pulse sequences of the brain and surrounding structures were obtained without intravenous contrast. COMPARISON:  MR Brain 08/08/22 FINDINGS: Brain: Acute infarcts in the bilateral corona radiata and in the left parietal lobe, new compared to 08/08/2022. there is chronic infarct in the posterior left parietal lobe, posterior right parietal lobe, and in the right cerebellum. No hemorrhage. No extra-axial fluid collection. No hydrocephalus. There is sequela of severe chronic microvascular ischemic change. Vascular: Normal flow voids. Skull and upper cervical spine: Normal marrow signal. Sinuses/Orbits: No middle ear or mastoid effusion. Paranasal sinuses are clear. Bilateral lens  replacement. Orbits are otherwise unremarkable Other: None. IMPRESSION: Acute infarcts in the bilateral corona radiata and in the left parietal lobe, new compared to 08/08/2022. No hemorrhage. Electronically Signed   By: Lorenza Cambridge M.D.   On: 09/10/2022 12:00   DG Chest 1 View  Result Date: 09/10/2022 CLINICAL DATA:  Right-sided weakness.  Right-sided chest pain. EXAM: CHEST  1 VIEW COMPARISON:  08/08/2022 FINDINGS: The lungs are clear without focal pneumonia, edema, pneumothorax or pleural effusion. The cardiopericardial silhouette is within normal limits for size. No acute bony abnormality. Telemetry leads overlie the chest. IMPRESSION: No active disease. Electronically Signed   By: Kennith Center M.D.   On: 09/10/2022 09:04   CT Head Wo Contrast  Result Date: 09/10/2022 CLINICAL DATA:  History follow-up.  Abnormal mental status. EXAM: CT HEAD WITHOUT CONTRAST TECHNIQUE: Contiguous axial images were obtained from the base of the skull through the vertex without intravenous contrast. RADIATION DOSE REDUCTION: This exam was performed according to the departmental dose-optimization program which includes automated exposure control, adjustment of the mA and/or kV according to patient size and/or use of iterative reconstruction technique. COMPARISON:  Brain MRI 08/08/2022 FINDINGS: Brain: No evidence of acute infarction, hemorrhage, hydrocephalus, extra-axial collection or mass lesion/mass effect. Chronic bifrontal and left parietal cortex infarcts. Small vessel infarcts in the deep gray nuclei, reference recent brain MRI showing areas of acuity. Vascular: No hyperdense vessel or unexpected calcification. Skull: Normal. Negative for fracture or focal lesion. Sinuses/Orbits: No acute finding. IMPRESSION: 1. No acute or reversible finding. 2. Chronic and subacute infarcts without noted progression from brain MRI 08/08/2022 Electronically Signed   By: Tiburcio Pea M.D.   On: 09/10/2022 08:39    Pending  Labs Unresulted Labs (From admission, onward)     Start     Ordered   09/17/22 0500  Creatinine, serum  (enoxaparin (LOVENOX)    CrCl >/= 30 ml/min)  Weekly,   R     Comments: while on enoxaparin therapy    09/10/22 1246   09/11/22 0500  CBC  Tomorrow morning,   R        09/10/22 1246   09/11/22 0500  Basic metabolic panel  Tomorrow morning,   R        09/10/22 1246   09/10/22 1246  Urine Culture (for pregnant, neutropenic or urologic patients or patients with an indwelling urinary catheter)  (Urine Labs)  Add-on,   AD       Question:  Indication  Answer:  Urgency/frequency   09/10/22 1246            Vitals/Pain Today's Vitals   09/10/22 0653 09/10/22 0900 09/10/22 1030 09/10/22 1100  BP:  (!) 170/68 (!) 164/57 (!) 166/62  Pulse:  66 66 65  Resp:  13  13 14  Temp: 98.7 F (37.1 C)   97.9 F (36.6 C)  TempSrc:      SpO2:  98% 100% 98%  Weight:      Height:      PainSc:        Isolation Precautions No active isolations  Medications Medications  aspirin EC tablet 81 mg (has no administration in time range)  tamoxifen (NOLVADEX) tablet 20 mg (has no administration in time range)  atorvastatin (LIPITOR) tablet 80 mg (has no administration in time range)  escitalopram (LEXAPRO) tablet 20 mg (has no administration in time range)  buPROPion (WELLBUTRIN XL) 24 hr tablet 300 mg (has no administration in time range)  clopidogrel (PLAVIX) tablet 75 mg (has no administration in time range)  melatonin tablet 3 mg (has no administration in time range)   stroke: early stages of recovery book (has no administration in time range)  acetaminophen (TYLENOL) tablet 650 mg (has no administration in time range)    Or  acetaminophen (TYLENOL) 160 MG/5ML solution 650 mg (has no administration in time range)    Or  acetaminophen (TYLENOL) suppository 650 mg (has no administration in time range)  enoxaparin (LOVENOX) injection 40 mg (has no administration in time range)  0.9 %  sodium  chloride infusion ( Intravenous New Bag/Given 09/10/22 0857)    Mobility non-ambulatory     Focused Assessments Neuro Assessment Handoff:  Swallow screen pass? Yes    NIH Stroke Scale  Dizziness Present: No Headache Present: No Level of Consciousness (1a.)   : Alert, keenly responsive LOC Questions (1b. )   : Answers both questions correctly LOC Commands (1c. )   : Performs both tasks correctly Best Gaze (2. )  : Normal Visual (3. )  : No visual loss Facial Palsy (4. )    : Normal symmetrical movements Motor Arm, Left (5a. )   : No drift Motor Arm, Right (5b. ) : Drift Motor Leg, Left (6a. )  : No drift Motor Leg, Right (6b. ) : Drift Limb Ataxia (7. ): Absent Sensory (8. )  : Normal, no sensory loss Best Language (9. )  : No aphasia Dysarthria (10. ): Normal Extinction/Inattention (11.)   : No Abnormality Complete NIHSS TOTAL: 2 Last date known well: 09/09/22 Last time known well: 2200 Neuro Assessment: Exceptions to WDL Neuro Checks:      Has TPA been given? No If patient is a Neuro Trauma and patient is going to OR before floor call report to 4N Charge nurse: [phone] or [phone]   R Recommendations: See Admitting Provider Note  Report given to:   Additional Notes: axox4, VSS

## 2022-09-10 NOTE — Consult Note (Signed)
Neurology Consultation  Reason for Consult: New infarcts on MRI brain, right sided weakness Referring Physician: Dr. Alinda Money  CC: Right-sided weakness  History is obtained from: Patient, chart review, patient's sister/POA at bedside  HPI: [name] is a 72 y.o. female with a medical history significant for CKD stage II, breast cancer, hyperlipidemia, essential hypertension, OSA, MDD, type 2 diabetes mellitus, obesity with BMI of 33.52 kg/m, history of thoracic spine surgery with chronic left lower extremity weakness per patient's history, and recent CVA presenting 08/08/2022 with symptoms of progressive weakness and recurrent falls with MRI findings of new cortical foci within the right MCA territory, left subcortical lacunar infarct, chronic cortically based infarct within bilateral frontal and left parietal lobes.  Patient was discharged to inpatient rehab and was then discharged home to live with her sister and POA on August 25, 2022.  Patient reports residual symptoms included some slightly slurred speech and subtle word-finding difficulties in addition to her chronic left lower extremity weakness.  On 09/09/2022, patient was attempting to get out of the recliner to go to the kitchen to get herself something to drink when she noticed "I had to heavy his legs in the world".  She was able to make it to the kitchen with very small steps before sitting down on her rolling walker and her family noticed that she looked ill.  Her blood pressure was noted to be low with a systolic of 95 and diastolic 40s.  She was given water to increase her blood pressure and went to bed.  When she tried to get out of bed tonight, she slid to the ground and family could not get her up prompting emergency services activation.  On arrival, EMS evaluated patient and transferred her to the ED for further evaluation.  Imaging in the ED revealed acute infarctions in the bilateral corona radiata and in the left parietal lobe.    Patient does endorse that she was supposed to have outpatient cardiac monitoring but had not yet received the monitoring device at home. She did take her DAPT for 3 weeks as prescribed and endorses compliance with her medications.   LKW: 09/09/22 in the evening TNK given?: no, patient presented outside of the thrombolytic therapy time window.  Recent CVA in May 2024. IR Thrombectomy? No, presentation is not consistent with an LVO. Modified Rankin Scale: 4-Needs assistance to walk and tend to bodily needs  ROS: A complete ROS was performed and is negative except as noted in the HPI.   Past Medical History:  Diagnosis Date   AKI (acute kidney injury) (HCC) 08/09/2022   Breast cancer (HCC) 12/2019   left breast DCIS   CKD (chronic kidney disease) 06/07/2016   Stage 2, GFR 60-89 ml/min   Diabetic polyneuropathy 06/07/2016   Dyslipidemia    Endometrial adenocarcinoma 2012   Fever blister    Hyperlipidemia    Hypertension, essential 06/07/2016   Major depressive disorder    Mixed dyslipidemia 06/07/2016   MRSA infection 10/2019   left foot wound   Obstructive sleep apnea 06/07/2016   does not use CPAP   Peripheral neuropathy    Retinopathy    Right rib fracture 08/09/2022   Severe obesity    Skin ulcer of left foot with fat layer exposed 11/02/2016   Stroke    Asymptomatic, discovered via neuroimaging; small infarct in left parietal lobe; also concern for small b/l frontal infarcts   Tachycardia 07/19/2017   Traumatic rhabdomyolysis (HCC) 08/09/2022   Type 2  diabetes mellitus with hyperglycemia, with long-term current use of insulin 06/07/2016   Unilateral primary osteoarthritis, right knee 04/16/2019   UTI (urinary tract infection) 08/09/2022   Past Surgical History:  Procedure Laterality Date   ADRENALECTOMY     BREAST LUMPECTOMY WITH RADIOACTIVE SEED LOCALIZATION Left 02/25/2020   Procedure: LEFT BREAST LUMPECTOMY WITH RADIOACTIVE SEED LOCALIZATION;  Surgeon: Abigail Miyamoto, MD;  Location: [facility];  Service: General;  Laterality: Left;   BUNIONECTOMY     CARDIAC CATHETERIZATION     CERVICAL SPINE SURGERY     CHOLECYSTECTOMY     DILATION AND CURETTAGE OF UTERUS     LAPAROSCOPIC SALPINGOOPHERECTOMY     THORACOTOMY     TONSILLECTOMY     Family History  Problem Relation Age of Onset   Hypertension Mother    Alzheimer's disease Mother    Heart attack Father    CAD Father    Alzheimer's disease Father    Bladder Cancer Father        dx late 29s   Diverticulitis Sister    Obesity Sister    Hypertension Sister    Voice disorder Brother    Breast cancer Paternal Aunt 51   Prostate cancer Paternal Uncle        dx mid 75s   Social History:   reports that she has never smoked. She has never used smokeless tobacco. She reports that she does not drink alcohol and does not use drugs.  Medications  Current Facility-Administered Medications:    [START ON 09/11/2022]  stroke: early stages of recovery book, , Does not apply, Once, [name], MD   acetaminophen (TYLENOL) tablet 650 mg, 650 mg, Oral, Q4H PRN **OR** acetaminophen (TYLENOL) 160 MG/5ML solution 650 mg, 650 mg, Per Tube, Q4H PRN **OR** acetaminophen (TYLENOL) suppository 650 mg, 650 mg, Rectal, Q4H PRN, [name], MD   aspirin EC tablet 81 mg, 81 mg, Oral, Daily, [name], MD   [START ON 09/11/2022] atorvastatin (LIPITOR) tablet 80 mg, 80 mg, Oral, Daily, [name], MD   [START ON 09/11/2022] buPROPion (WELLBUTRIN XL) 24 hr tablet 300 mg, 300 mg, Oral, Daily, [name], MD   [START ON 09/11/2022] clopidogrel (PLAVIX) tablet 75 mg, 75 mg, Oral, Daily, [name], MD   enoxaparin (LOVENOX) injection 40 mg, 40 mg, Subcutaneous, Q24H, [name], MD   [START ON 09/11/2022] escitalopram (LEXAPRO) tablet 20 mg, 20 mg, Oral, Daily, [name], MD   melatonin tablet 3 mg, 3 mg, Oral, QHS PRN, [name],  MD   tamoxifen (NOLVADEX) tablet 20 mg, 20 mg, Oral, Daily, [name], MD  Current Outpatient Medications:    acetaminophen (TYLENOL) 325 MG tablet, Take 2 tablets (650 mg total) by mouth every 6 (six) hours as needed for mild pain, fever or headache., Disp: , Rfl:    amLODipine (NORVASC) 10 MG tablet, Take 1 tablet (10 mg total) by mouth daily., Disp: 30 tablet, Rfl: 0   ascorbic acid (VITAMIN C) 500 MG tablet, Take 1 tablet (500 mg total) by mouth daily., Disp: 100 tablet, Rfl: 0   aspirin EC 81 MG tablet, Take 1 tablet (81 mg total) by mouth daily. Swallow whole., Disp: , Rfl:    atorvastatin (LIPITOR) 80 MG tablet, Take 1 tablet (80 mg total) by mouth daily., Disp: 30 tablet, Rfl: 0   buPROPion (WELLBUTRIN XL) 300 MG 24 hr tablet, Take 1 tablet (300 mg total) by mouth  daily., Disp: 30 tablet, Rfl: 0   carvedilol (COREG) 6.25 MG tablet, Take 1 tablet (6.25 mg total) by mouth 2 (two) times daily., Disp: 60 tablet, Rfl: 0   clopidogrel (PLAVIX) 75 MG tablet, Take 1 tablet (75 mg total) by mouth daily., Disp: 5 tablet, Rfl: 0   doxycycline (VIBRA-TABS) 100 MG tablet, Take 1 tablet (100 mg total) by mouth every 12 (twelve) hours., Disp: 6 tablet, Rfl: 0   escitalopram (LEXAPRO) 20 MG tablet, Take 1 tablet (20 mg total) by mouth daily., Disp: 30 tablet, Rfl: 0   insulin NPH-regular Human (NOVOLIN 70/30) (70-30) 100 UNIT/ML injection, Inject 24-28 Units into the skin See admin instructions. 24 units in the morning, 28 units in the evening, Disp: , Rfl:    losartan (COZAAR) 25 MG tablet, Take 1 tablet (25 mg total) by mouth every morning., Disp: 30 tablet, Rfl: 0   melatonin 3 MG TABS tablet, Take 1 tablet (3 mg total) by mouth at bedtime as needed., Disp: 60 tablet, Rfl: 0   metFORMIN (GLUCOPHAGE) 1000 MG tablet, Take 1 tablet (1,000 mg total) by mouth 2 (two) times daily with a meal., Disp: 60 tablet, Rfl: 0   metFORMIN (GLUCOPHAGE) 500 MG tablet, Take 1 tablet (500 mg total) by mouth 2  (two) times daily with a meal., Disp: 60 tablet, Rfl: 0   tamoxifen (NOLVADEX) 20 MG tablet, TAKE ONE TABLET BY MOUTH EVERY MORNING (Patient taking differently: Take 20 mg by mouth daily.), Disp: 90 tablet, Rfl: 0   Zinc Sulfate 220 (50 Zn) MG TABS, Take 1 tablet (220 mg total) by mouth daily., Disp: 100 tablet, Rfl: 0  Exam: Current vital signs: BP (!) 166/62   Pulse 65   Temp 97.9 F (36.6 C)   Resp 14   Ht 5\' 9"  (1.753 m)   Wt 103 kg   SpO2 98%   BMI 33.52 kg/m  Vital signs in last 24 hours: Temp:  [97.9 F (36.6 C)-98.7 F (37.1 C)] 97.9 F (36.6 C) (06/22 1100) Pulse Rate:  [59-66] 65 (06/22 1100) Resp:  [12-15] 14 (06/22 1100) BP: (139-170)/(55-68) 166/62 (06/22 1100) SpO2:  [98 %-100 %] 98 % (06/22 1100) Weight:  [960 kg] 103 kg (06/22 0521)  GENERAL: Awake, alert, in no acute distress Psych: Affect appropriate for situation, patient is calm and cooperative with examination Head: Normocephalic and atraumatic, without obvious abnormality EENT: Normal conjunctivae, dry mucous membranes, no OP obstruction LUNGS: Normal respiratory effort. Non-labored breathing on room air CV: Regular rate and rhythm on telemetry ABDOMEN: Soft, non-tender, non-distended Extremities: Warm, well perfused, without obvious deformity  NEURO:  Mental Status: Awake, alert, and oriented to person, place, time, and situation. She is able to provide a clear and coherent history of present illness. Speech/Language: speech is intact. She is able to name 3/4 objects and has trouble with naming provider badge.  She is able to name the object after approximately 1 minute but endorses occasional word-finding difficulties in conversation from the first stroke in May of 2024.    Repetition, fluency, and comprehension intact without aphasia.  Speech is hypophonic but no obvious slurring noted during examination.  No neglect is noted Cranial Nerves:  II: PERRL. visual fields full.  III, IV, VI: EOMI  without gaze preference, diplopia, or nystagmus V: Sensation is intact to light touch and symmetrical to face.  VII: Face is symmetric resting and smiling.  Masked facies VIII: Hearing is intact to voice IX, X: Palate elevation is symmetric. Phonation  normal.  XI: Normal sternocleidomastoid and trapezius muscle strength XII: Tongue protrudes midline without fasciculations.   Motor: Bilateral lower extremities attempt antigravity movement with minimal elevation of the bed.  Patient reports left lower extremity weakness is chronic, right lower extremity weakness is new.  No overt asymmetry in bilateral lower extremities Left upper extremity elevates antigravity without vertical drift, right upper extremity with minimal vertical drift with antigravity position.  Mild grip strength weakness on the right compared to the left.  Bradykinetic movements.  Tone is normal. Bulk is normal.  Sensation: Intact to light touch bilaterally in all four extremities. No extinction to DSS present.  Coordination: FTN intact bilaterally.  Able to perform HKS bilaterally Gait: Deferred for patient's safety  NIHSS: 1a Level of Conscious.: 0 1b LOC Questions: 0 1c LOC Commands: 0 2 Best Gaze: 0 3 Visual: 0 4 Facial Palsy: 0 5a Motor Arm - left: 0 5b Motor Arm - Right: 1 6a Motor Leg - Left: 3 6b Motor Leg - Right: 3 7 Limb Ataxia: 0 8 Sensory: 0 9 Best Language: 0 10 Dysarthria: 0 11 Extinction and Inattention.: 0 TOTAL: 7  Labs I have reviewed labs in epic and the results pertinent to this consultation are: CBC    Component Value Date/Time   WBC 8.0 09/10/2022 0527   RBC 3.16 (L) 09/10/2022 0527   HGB 10.2 (L) 09/10/2022 0527   HGB 12.1 01/08/2020 1051   HCT 31.1 (L) 09/10/2022 0527   PLT 272 09/10/2022 0527   PLT 280 01/08/2020 1051   MCV 98.4 09/10/2022 0527   MCH 32.3 09/10/2022 0527   MCHC 32.8 09/10/2022 0527   RDW 13.3 09/10/2022 0527   LYMPHSABS 2.2 09/10/2022 0527   MONOABS 0.7  09/10/2022 0527   EOSABS 0.1 09/10/2022 0527   BASOSABS 0.0 09/10/2022 0527   CMP     Component Value Date/Time   NA 133 (L) 09/10/2022 0527   K 4.3 09/10/2022 0527   CL 101 09/10/2022 0527   CO2 22 09/10/2022 0527   GLUCOSE 155 (H) 09/10/2022 0527   BUN 32 (H) 09/10/2022 0527   CREATININE 1.82 (H) 09/10/2022 0527   CREATININE 1.42 (H) 07/26/2022 1554   CALCIUM 8.9 09/10/2022 0527   PROT 6.9 09/10/2022 0527   ALBUMIN 3.1 (L) 09/10/2022 0527   AST 18 09/10/2022 0527   AST 18 01/08/2020 1051   ALT 18 09/10/2022 0527   ALT 20 01/08/2020 1051   ALKPHOS 42 09/10/2022 0527   BILITOT 0.7 09/10/2022 0527   BILITOT 0.3 01/08/2020 1051   GFRNONAA 29 (L) 09/10/2022 0527   GFRNONAA 44 (L) 01/08/2020 1051   GFRNONAA 70 01/03/2017 1612   GFRAA 81 01/03/2017 1612   Lipid Panel     Component Value Date/Time   CHOL 121 08/09/2022 0328   TRIG 144 08/09/2022 0328   HDL 34 (L) 08/09/2022 0328   CHOLHDL 3.6 08/09/2022 0328   VLDL 29 08/09/2022 0328   LDLCALC 58 08/09/2022 0328   Lab Results  Component Value Date   HGBA1C 7.0 (H) 08/09/2022   [facility] I have reviewed the images obtained:  CT-scan of the brain 09/10/22: 1. No acute or reversible finding. 2. Chronic and subacute infarcts without noted progression from brain MRI 08/08/2022  MRI examination of the brain 09/10/22: Acute infarcts in the bilateral corona radiata and in the left parietal lobe, new compared to 08/08/2022. No hemorrhage.  Assessment: 72 y.o. female with PMHx of breast cancer, CKD stage II, HLD, HTN,  OSA, MDD, DM2, obesity, and recent CVA in May 2024 s/p rehab discharge August 25, 2022 who presented to the ED on 09/10/2022 for evaluation of new onset of right-sided weakness.  Imaging on ED arrival revealed acute infarcts in the bilateral corona radiate and left parietal and patient was admitted for further stroke management.  Of note, patient completed 3 weeks of DAPT at discharge followed by ASA monotherapy.  Patient was discharged with a recommendation for a 30-day event monitor but patient reports she has not yet initiated outpatient cardiac monitoring at this time.   Impression: Acute infarcts- bilateral corona radiata and left parietal likely cardioembolic etiology with multiple vascular territories involved; alternatively  consider watershed infarcts in the setting of hypotension at home   Recommendations:  - BL LE [place] for DVT evaluation - Vessel imaging: MRA head with carotid dopplers - Orthostatic vitals; may need longer term higher BP goal if significantly orthostatic, as well as TED stocking, abdominal binder, etc  - Continue ASA for stroke prophylaxis - Further recommendations regarding stroke prophylaxis per stroke team with recent stroke work up in May 2024 - PT/OT evaluation  - Frequent neuro checks - Prophylactic therapy- Antiplatelet med: Aspirin - dose 81 mg PO daily for now, further recommendations per stroke team (hold plavix for now as may need Anticoagulation) - Risk factor modification - Telemetry monitoring; will need outpatient cardiac monitor  - PT consult, OT consult, Speech consult - Stroke team to follow  Pt seen by NP/Neuro and later by MD. Note/plan to be edited by MD as needed.  Lanae Boast, AGAC-NP [facility] Pager: [phone]

## 2022-09-11 ENCOUNTER — Observation Stay (HOSPITAL_COMMUNITY): Payer: PPO

## 2022-09-11 ENCOUNTER — Inpatient Hospital Stay (HOSPITAL_COMMUNITY): Payer: PPO

## 2022-09-11 DIAGNOSIS — Z7982 Long term (current) use of aspirin: Secondary | ICD-10-CM | POA: Diagnosis not present

## 2022-09-11 DIAGNOSIS — Z8542 Personal history of malignant neoplasm of other parts of uterus: Secondary | ICD-10-CM | POA: Diagnosis not present

## 2022-09-11 DIAGNOSIS — Z8673 Personal history of transient ischemic attack (TIA), and cerebral infarction without residual deficits: Secondary | ICD-10-CM | POA: Diagnosis not present

## 2022-09-11 DIAGNOSIS — M7989 Other specified soft tissue disorders: Secondary | ICD-10-CM

## 2022-09-11 DIAGNOSIS — Z7902 Long term (current) use of antithrombotics/antiplatelets: Secondary | ICD-10-CM | POA: Diagnosis not present

## 2022-09-11 DIAGNOSIS — F331 Major depressive disorder, recurrent, moderate: Secondary | ICD-10-CM | POA: Diagnosis not present

## 2022-09-11 DIAGNOSIS — E11319 Type 2 diabetes mellitus with unspecified diabetic retinopathy without macular edema: Secondary | ICD-10-CM | POA: Diagnosis not present

## 2022-09-11 DIAGNOSIS — E782 Mixed hyperlipidemia: Secondary | ICD-10-CM | POA: Diagnosis not present

## 2022-09-11 DIAGNOSIS — G8191 Hemiplegia, unspecified affecting right dominant side: Secondary | ICD-10-CM | POA: Diagnosis not present

## 2022-09-11 DIAGNOSIS — Z853 Personal history of malignant neoplasm of breast: Secondary | ICD-10-CM | POA: Diagnosis not present

## 2022-09-11 DIAGNOSIS — M25511 Pain in right shoulder: Secondary | ICD-10-CM | POA: Diagnosis not present

## 2022-09-11 DIAGNOSIS — Z803 Family history of malignant neoplasm of breast: Secondary | ICD-10-CM | POA: Diagnosis not present

## 2022-09-11 DIAGNOSIS — I69351 Hemiplegia and hemiparesis following cerebral infarction affecting right dominant side: Secondary | ICD-10-CM | POA: Diagnosis not present

## 2022-09-11 DIAGNOSIS — E1142 Type 2 diabetes mellitus with diabetic polyneuropathy: Secondary | ICD-10-CM | POA: Diagnosis not present

## 2022-09-11 DIAGNOSIS — E1165 Type 2 diabetes mellitus with hyperglycemia: Secondary | ICD-10-CM | POA: Diagnosis not present

## 2022-09-11 DIAGNOSIS — Z79899 Other long term (current) drug therapy: Secondary | ICD-10-CM | POA: Diagnosis not present

## 2022-09-11 DIAGNOSIS — Z8249 Family history of ischemic heart disease and other diseases of the circulatory system: Secondary | ICD-10-CM | POA: Diagnosis not present

## 2022-09-11 DIAGNOSIS — L97429 Non-pressure chronic ulcer of left heel and midfoot with unspecified severity: Secondary | ICD-10-CM | POA: Diagnosis not present

## 2022-09-11 DIAGNOSIS — I639 Cerebral infarction, unspecified: Secondary | ICD-10-CM | POA: Diagnosis not present

## 2022-09-11 DIAGNOSIS — I129 Hypertensive chronic kidney disease with stage 1 through stage 4 chronic kidney disease, or unspecified chronic kidney disease: Secondary | ICD-10-CM | POA: Diagnosis not present

## 2022-09-11 DIAGNOSIS — Z794 Long term (current) use of insulin: Secondary | ICD-10-CM

## 2022-09-11 DIAGNOSIS — I10 Essential (primary) hypertension: Secondary | ICD-10-CM | POA: Diagnosis not present

## 2022-09-11 DIAGNOSIS — Z6833 Body mass index (BMI) 33.0-33.9, adult: Secondary | ICD-10-CM | POA: Diagnosis not present

## 2022-09-11 DIAGNOSIS — I63233 Cerebral infarction due to unspecified occlusion or stenosis of bilateral carotid arteries: Secondary | ICD-10-CM | POA: Diagnosis not present

## 2022-09-11 DIAGNOSIS — E669 Obesity, unspecified: Secondary | ICD-10-CM | POA: Diagnosis not present

## 2022-09-11 DIAGNOSIS — L97522 Non-pressure chronic ulcer of other part of left foot with fat layer exposed: Secondary | ICD-10-CM | POA: Diagnosis not present

## 2022-09-11 DIAGNOSIS — L97519 Non-pressure chronic ulcer of other part of right foot with unspecified severity: Secondary | ICD-10-CM | POA: Diagnosis not present

## 2022-09-11 DIAGNOSIS — N1831 Chronic kidney disease, stage 3a: Secondary | ICD-10-CM | POA: Diagnosis not present

## 2022-09-11 DIAGNOSIS — K5900 Constipation, unspecified: Secondary | ICD-10-CM | POA: Diagnosis not present

## 2022-09-11 DIAGNOSIS — Z820 Family history of epilepsy and other diseases of the nervous system: Secondary | ICD-10-CM | POA: Diagnosis not present

## 2022-09-11 DIAGNOSIS — Z881 Allergy status to other antibiotic agents status: Secondary | ICD-10-CM | POA: Diagnosis not present

## 2022-09-11 DIAGNOSIS — N1832 Chronic kidney disease, stage 3b: Secondary | ICD-10-CM | POA: Diagnosis not present

## 2022-09-11 DIAGNOSIS — E11621 Type 2 diabetes mellitus with foot ulcer: Secondary | ICD-10-CM | POA: Diagnosis not present

## 2022-09-11 DIAGNOSIS — L97419 Non-pressure chronic ulcer of right heel and midfoot with unspecified severity: Secondary | ICD-10-CM | POA: Diagnosis not present

## 2022-09-11 DIAGNOSIS — I69398 Other sequelae of cerebral infarction: Secondary | ICD-10-CM | POA: Diagnosis not present

## 2022-09-11 DIAGNOSIS — I6381 Other cerebral infarction due to occlusion or stenosis of small artery: Secondary | ICD-10-CM | POA: Diagnosis not present

## 2022-09-11 DIAGNOSIS — E1122 Type 2 diabetes mellitus with diabetic chronic kidney disease: Secondary | ICD-10-CM | POA: Diagnosis not present

## 2022-09-11 DIAGNOSIS — L97529 Non-pressure chronic ulcer of other part of left foot with unspecified severity: Secondary | ICD-10-CM | POA: Diagnosis not present

## 2022-09-11 DIAGNOSIS — E785 Hyperlipidemia, unspecified: Secondary | ICD-10-CM | POA: Diagnosis not present

## 2022-09-11 DIAGNOSIS — Z8052 Family history of malignant neoplasm of bladder: Secondary | ICD-10-CM | POA: Diagnosis not present

## 2022-09-11 DIAGNOSIS — G4733 Obstructive sleep apnea (adult) (pediatric): Secondary | ICD-10-CM | POA: Diagnosis not present

## 2022-09-11 LAB — CBC
HCT: 31 % — ABNORMAL LOW (ref 36.0–46.0)
Hemoglobin: 10.3 g/dL — ABNORMAL LOW (ref 12.0–15.0)
MCH: 32.1 pg (ref 26.0–34.0)
MCHC: 33.2 g/dL (ref 30.0–36.0)
MCV: 96.6 fL (ref 80.0–100.0)
Platelets: 224 10*3/uL (ref 150–400)
RBC: 3.21 MIL/uL — ABNORMAL LOW (ref 3.87–5.11)
RDW: 13.4 % (ref 11.5–15.5)
WBC: 5.5 10*3/uL (ref 4.0–10.5)
nRBC: 0 % (ref 0.0–0.2)

## 2022-09-11 LAB — BASIC METABOLIC PANEL
Anion gap: 8 (ref 5–15)
BUN: 23 mg/dL (ref 8–23)
CO2: 24 mmol/L (ref 22–32)
Calcium: 8.8 mg/dL — ABNORMAL LOW (ref 8.9–10.3)
Chloride: 106 mmol/L (ref 98–111)
Creatinine, Ser: 1.43 mg/dL — ABNORMAL HIGH (ref 0.44–1.00)
GFR, Estimated: 39 mL/min — ABNORMAL LOW (ref >60)
Glucose, Bld: 117 mg/dL — ABNORMAL HIGH (ref 70–99)
Potassium: 3.5 mmol/L (ref 3.5–5.1)
Sodium: 138 mmol/L (ref 135–145)

## 2022-09-11 LAB — GLUCOSE, CAPILLARY
Glucose-Capillary: 133 mg/dL — ABNORMAL HIGH (ref 70–99)
Glucose-Capillary: 113 mg/dL — ABNORMAL HIGH (ref 70–99)
Glucose-Capillary: 129 mg/dL — ABNORMAL HIGH (ref 70–99)
Glucose-Capillary: 170 mg/dL — ABNORMAL HIGH (ref 70–99)

## 2022-09-11 MED ORDER — SODIUM CHLORIDE 0.9 % IV SOLN: INTRAVENOUS | Status: DC

## 2022-09-11 MED ORDER — IOHEXOL 350 MG/ML SOLN
75.0000 mL | Freq: Once | INTRAVENOUS | Status: AC | PRN
  Administered 2022-09-11: 75 mL via INTRAVENOUS

## 2022-09-11 NOTE — Progress Notes (Signed)
Blood pressure goal: BP <220/110  Order given by Carollee Herter.

## 2022-09-11 NOTE — Progress Notes (Signed)
Inpatient Rehab Admissions Coordinator:  ? ?Per therapy recommendations,  patient was screened for CIR candidacy by Carmencita Cusic, MS, CCC-SLP. At this time, Pt. Appears to be a a potential candidate for CIR. I will place   order for rehab consult per protocol for full assessment. Please contact me any with questions. ? ?Ndea Kilroy, MS, CCC-SLP ?Rehab Admissions Coordinator  ?[phone] (celll) ?[phone] (office) ? ?

## 2022-09-11 NOTE — Evaluation (Signed)
Physical Therapy Evaluation Patient Details Name: [name] MRN: [id number] DOB: 03-05-1951 Today's Date: 09/11/2022  History of Present Illness  Pt is a 72 y.o. female who presented 09/10/22 with R-sided weakness. MRI showed new acute infarcts in the bilateral corona radiata and in the left parietal lobe. Pt recently admitted 08/08/22  with CVA and R rib fx and discharged to [facility] then home. PMH: ductal carcinoma in situ of left breast, DM2, obesity, CKD, HLD, HTN, OSA   Clinical Impression  Pt presents with condition above and deficits mentioned below, see PT Problem List. PTA, she was mod I with a rollator for functional mobility, living with her sister in a 1-level house with 5 STE. Currently, pt is demonstrating a posterior bias and sway when initially standing, needing modA for transfers. She is also displaying R lower extremity weakness, resulting in her dragging her R foot when stepping. Pt needed minA for balance and RW management to ambulate at this time. As pt did well with [facility] last time and has had a functional decline again, she would be a good candidate for intensive inpatient rehab, >3 hours/day. Will continue to follow acutely.   Orthostatics:  163/68 supine 155/67 sitting 128/107 standing 129/62 standing ~3 min 212/83 supine after ambulating 191/69 supine several more minutes at end of session  *pt denied symptoms but did report L leg feeling "heavy" end of session but then improved with rest, notified RN     Recommendations for follow up therapy are one component of a multi-disciplinary discharge planning process, led by the attending physician.  Recommendations may be updated based on patient status, additional functional criteria and insurance authorization.  Follow Up Recommendations       Assistance Recommended at Discharge Frequent or constant Supervision/Assistance  Patient can return home with the following  A lot of help with walking and/or transfers;A little  help with bathing/dressing/bathroom;Assistance with cooking/housework;Help with stairs or ramp for entrance;Assist for transportation    Equipment Recommendations None recommended by PT  Recommendations for Other Services  Rehab consult    Functional Status Assessment Patient has had a recent decline in their functional status and demonstrates the ability to make significant improvements in function in a reasonable and predictable amount of time.     Precautions / Restrictions Precautions Precautions: Fall Restrictions Weight Bearing Restrictions: No      Mobility  Bed Mobility Overal bed mobility: Needs Assistance Bed Mobility: Sit to Supine       Sit to supine: Mod assist   General bed mobility comments: ModA to bring each leg onto bed and scoot hips, legs, and trunk laterally in bed to obtain midline    Transfers Overall transfer level: Needs assistance Equipment used: Rolling walker (2 wheels) Transfers: Sit to/from Stand Sit to Stand: Mod assist           General transfer comment: Pt cued to push up from recliner armrests. ModA to power up to stand and gain balance due to noted posterior sway by pt.    Ambulation/Gait Ambulation/Gait assistance: Min assist Gait Distance (Feet): 80 Feet Assistive device: Rolling walker (2 wheels) Gait Pattern/deviations: Decreased step length - right, Step-through pattern, Decreased dorsiflexion - right, Trunk flexed, Decreased stride length Gait velocity: reduced Gait velocity interpretation: <1.31 ft/sec, indicative of household ambulator   General Gait Details: Pt with slow, short steps, especially on the R, needing repeated cues to march, extend knee, and place foot with heel initial contact as pt tends to drag her  R foot. Mod momentary success noted. MinA for balance and RW management, repeatedly cuing pt to remain within RW when turning  Stairs            Wheelchair Mobility    Modified Rankin (Stroke Patients  Only) Modified Rankin (Stroke Patients Only) Pre-Morbid Rankin Score: Moderate disability Modified Rankin: Moderately severe disability     Balance Overall balance assessment: Needs assistance Sitting-balance support: Single extremity supported, Feet supported Sitting balance-Leahy Scale: Fair   Postural control: Posterior lean Standing balance support: Bilateral upper extremity supported, During functional activity Standing balance-Leahy Scale: Poor Standing balance comment: Reliant on RW and min-modA with posterior sway noted                             Pertinent Vitals/Pain Pain Assessment Pain Assessment: Faces Faces Pain Scale: Hurts a little bit Pain Location: bilateral feet Pain Descriptors / Indicators: Grimacing Pain Intervention(s): Limited activity within patient's tolerance, Monitored during session, Repositioned    Home Living Family/patient expects to be discharged to:: Private residence Living Arrangements: Other relatives (sister) Available Help at Discharge: Family;Available 24 hours/day Type of Home: House Home Access: Stairs to enter Entrance Stairs-Rails: Doctor, general practice of Steps: 5   Home Layout: One level Home Equipment: Rollator (4 wheels);Tub bench;BSC/3in1      Prior Function Prior Level of Function : Independent/Modified Independent;Driving             Mobility Comments: Mod I with rollator ADLs Comments: tub bench for shower, pt was independent with all ADL except donning ted hose which family would assist with. family also assisted with IADL     Hand Dominance   Dominant Hand: Right    Extremity/Trunk Assessment   Upper Extremity Assessment Upper Extremity Assessment: Defer to OT evaluation    Lower Extremity Assessment Lower Extremity Assessment: RLE deficits/detail RLE Deficits / Details: MMT scores of 3 hip flexion, 4- knee extension, 3+ ankle dorsiflexion; hx of peripheral neuropathy resulting  in no sensation below knees bil but sensation at thighs intact bil; [place] incoordination RLE Sensation: history of peripheral neuropathy RLE Coordination: decreased [place] motor    Cervical / Trunk Assessment Cervical / Trunk Assessment: Normal  Communication   Communication: Other (comment) (slowed speech)  Cognition Arousal/Alertness: Awake/alert Behavior During Therapy: WFL for tasks assessed/performed Overall Cognitive Status: Within Functional Limits for tasks assessed                                 General Comments: Slow processing, but pt states this has been the case since she retired        General Comments General comments (skin integrity, edema, etc.): Orthostatics: 163/68 supine, 155/67 sitting, 128/107 standing, 129/62 standing ~3 min, 212/83 supine after ambulating, 191/69 supine several more minutes at end of session; pt denied symptoms but did report L leg feeling "heavy" end of session but then improved with rest, notified RN    Exercises     Assessment/Plan    PT Assessment Patient needs continued PT services  PT Problem List Decreased strength;Decreased activity tolerance;Decreased balance;Decreased mobility;Decreased coordination;Impaired sensation       PT Treatment Interventions DME instruction;Gait training;Functional mobility training;Stair training;Therapeutic activities;Therapeutic exercise;Balance training;Neuromuscular re-education;Patient/family education    PT Goals (Current goals can be found in the Care Plan section)  Acute Rehab PT Goals Patient Stated Goal: to improve PT Goal Formulation: With  patient Time For Goal Achievement: 09/25/22 Potential to Achieve Goals: Good    Frequency Min 4X/week     Co-evaluation               AM-PAC PT "6 Clicks" Mobility  Outcome Measure Help needed turning from your back to your side while in a flat bed without using bedrails?: A Little Help needed moving from lying on your back  to sitting on the side of a flat bed without using bedrails?: A Lot Help needed moving to and from a bed to a chair (including a wheelchair)?: A Lot Help needed standing up from a chair using your arms (e.g., wheelchair or bedside chair)?: A Lot Help needed to walk in hospital room?: A Little Help needed climbing 3-5 steps with a railing? : Total 6 Click Score: 13    End of Session Equipment Utilized During Treatment: Gait belt Activity Tolerance: Patient tolerated treatment well Patient left: in bed;with call bell/phone within reach;with bed alarm set Nurse Communication: Mobility status;Other (comment) (L leg momentarily "heavy") PT Visit Diagnosis: Unsteadiness on feet (R26.81);Other abnormalities of gait and mobility (R26.89);Muscle weakness (generalized) (M62.81);Difficulty in walking, not elsewhere classified (R26.2);Other symptoms and signs involving the nervous system (R29.898);Hemiplegia and hemiparesis Hemiplegia - Right/Left: Right Hemiplegia - dominant/non-dominant: Dominant Hemiplegia - caused by: Cerebral infarction    Time: 1210-1249 PT Time Calculation (min) (ACUTE ONLY): 39 min   Charges:   PT Evaluation $PT Eval Moderate Complexity: 1 Mod PT Treatments $Gait Training: 8-22 mins $Therapeutic Activity: 8-22 mins        Raymond Gurney, PT, DPT Acute Rehabilitation Services  Office: [phone]   Jewel Baize 09/11/2022, 1:14 PM

## 2022-09-11 NOTE — Progress Notes (Signed)
PROGRESS NOTE        PATIENT DETAILS Name: [name] Age: 72 y.o. Sex: female Date of Birth: April 02, 1950 Admit Date: 09/10/2022 Admitting Physician [name], MD [id number], Aram Beecham, MD  Brief Summary: Patient is a 72 y.o.  female with history of recent CVA, HTN, HLD, CKD stage IIIb-presenting with right-sided weakness-found to have acute CVA.  Significant events: 6/22>> admit to [facility] for right-sided weakness. 5/24-6/6>> hospitalization at [facility] 5/20-5/24>> hospitalization for CVA-discharge to [facility]  Significant studies: 5/21>> LDL:58 5/21>> A1c: 7.0 5/21>> echo: EF 60-65% 5/21>> carotid Doppler: Left ICA stenosis-40-59% 6/22>> MRI brain: Acute infarct bilateral corona radiator/left parietal lobe. 6/22>> MRI brain: No LVO/significant stenosis  Significant microbiology data: None  Procedures: None  Consults: Neurology  Subjective: Lying comfortably in bed-denies any chest pain or shortness of breath.  Moving all 4 extremities when lying down but claims that she is weak when walking.  Objective: Vitals: Blood pressure (!) 168/62, pulse 69, temperature 98.4 F (36.9 C), temperature source Oral, resp. rate 16, height 5\' 9"  (1.753 m), weight 103 kg, SpO2 100 %.   Exam: Gen Exam:Alert awake-not in any distress HEENT:atraumatic, normocephalic Chest: B/L clear to auscultation anteriorly CVS:S1S2 regular Abdomen:soft non tender, non distended Extremities:no edema Neurology: Slight right-sided weakness Skin: no rash  Pertinent Labs/Radiology:    Latest Ref Rng & Units 09/11/2022    5:00 AM 09/10/2022    5:27 AM 08/22/2022    6:59 AM  CBC  WBC 4.0 - 10.5 K/uL 5.5  8.0  6.7   Hemoglobin 12.0 - 15.0 g/dL 54.0  98.1  8.8   Hematocrit 36.0 - 46.0 % 31.0  31.1  27.1   Platelets 150 - 400 K/uL 224  272  264     Lab Results  Component Value Date   NA 138 09/11/2022   K 3.5 09/11/2022   CL 106 09/11/2022   CO2 24 09/11/2022       Assessment/Plan: Acute CVA Mild right-sided weakness-given bilateral nature-concerned that this could be embolic CTA neck pending Rest of the workup as above-done recently Aspirin/Plavix/statin Await neurology/PT/OT eval. Continue telemetry monitoring.  HTN Permissive hypertension in the setting of acute CVA/perhaps carotid stenosis Continue to hold antihypertensives  HLD Statin  DM-2 CBG stable Insulin 70/30 20 units twice daily/SSI  Recent Labs    09/10/22 2144 09/10/22 2229 09/11/22 0809  GLUCAP 101* 138* 133*    DCIS-s/p lumpectomy/XRT Tamoxifen Resume outpatient follow-up with oncology  Depression Stable Wellbutrin/Lexapro  Asymptomatic bacteriuria No symptoms-monitor off antibiotics  CKD 3a At baseline  Obesity: Estimated body mass index is 33.52 kg/m as calculated from the following:   Height as of this encounter: 5\' 9"  (1.753 m).   Weight as of this encounter: 103 kg.   Code status:   Code Status: Full Code   DVT Prophylaxis: enoxaparin (LOVENOX) injection 40 mg Start: 09/10/22 2000   Family Communication: None at bedside   Disposition Plan: Status is: Observation The patient will require care spanning > 2 midnights and should be moved to inpatient because: Severity of illness   Planned Discharge Destination:Home health   Diet: Diet Order             Diet heart healthy/carb modified Room service appropriate? Yes; Fluid consistency: Thin  Diet effective now  Antimicrobial agents: Anti-infectives (From admission, onward)    None        MEDICATIONS: Scheduled Meds:   stroke: early stages of recovery book   Does not apply Once   aspirin EC  81 mg Oral Daily   atorvastatin  80 mg Oral Daily   buPROPion  300 mg Oral Daily   clopidogrel  75 mg Oral Daily   enoxaparin (LOVENOX) injection  40 mg Subcutaneous Q24H   escitalopram  20 mg Oral Daily   insulin aspart  0-9 Units Subcutaneous TID WC    insulin aspart protamine- aspart  20 Units Subcutaneous BID WC   tamoxifen  20 mg Oral Daily   Continuous Infusions:  sodium chloride     PRN Meds:.acetaminophen **OR** acetaminophen (TYLENOL) oral liquid 160 mg/5 mL **OR** acetaminophen, melatonin   I have personally reviewed following labs and imaging studies  LABORATORY DATA: CBC: Recent Labs  Lab 09/10/22 0527 09/11/22 0500  WBC 8.0 5.5  NEUTROABS 4.9  --   HGB 10.2* 10.3*  HCT 31.1* 31.0*  MCV 98.4 96.6  PLT 272 224    Basic Metabolic Panel: Recent Labs  Lab 09/10/22 0527 09/11/22 0500  NA 133* 138  K 4.3 3.5  CL 101 106  CO2 22 24  GLUCOSE 155* 117*  BUN 32* 23  CREATININE 1.82* 1.43*  CALCIUM 8.9 8.8*    GFR: Estimated Creatinine Clearance: 46.1 mL/min (A) (by C-G formula based on SCr of 1.43 mg/dL (H)).  Liver Function Tests: Recent Labs  Lab 09/10/22 0527  AST 18  ALT 18  ALKPHOS 42  BILITOT 0.7  PROT 6.9  ALBUMIN 3.1*   No results for input(s): "LIPASE", "AMYLASE" in the last 168 hours. No results for input(s): "AMMONIA" in the last 168 hours.  Coagulation Profile: Recent Labs  Lab 09/10/22 0527  INR 1.1    Cardiac Enzymes: No results for input(s): "CKTOTAL", "CKMB", "CKMBINDEX", "TROPONINI" in the last 168 hours.  BNP (last 3 results) No results for input(s): "PROBNP" in the last 8760 hours.  Lipid Profile: No results for input(s): "CHOL", "HDL", "LDLCALC", "TRIG", "CHOLHDL", "LDLDIRECT" in the last 72 hours.  Thyroid Function Tests: No results for input(s): "TSH", "T4TOTAL", "FREET4", "T3FREE", "THYROIDAB" in the last 72 hours.  Anemia Panel: No results for input(s): "VITAMINB12", "FOLATE", "FERRITIN", "TIBC", "IRON", "RETICCTPCT" in the last 72 hours.  Urine analysis:    Component Value Date/Time   COLORURINE YELLOW 09/10/2022 0659   APPEARANCEUR CLEAR 09/10/2022 0659   LABSPEC 1.006 09/10/2022 0659   PHURINE 5.0 09/10/2022 0659   GLUCOSEU NEGATIVE 09/10/2022 0659    HGBUR NEGATIVE 09/10/2022 0659   BILIRUBINUR NEGATIVE 09/10/2022 0659   KETONESUR NEGATIVE 09/10/2022 0659   PROTEINUR NEGATIVE 09/10/2022 0659   UROBILINOGEN 0.2 03/24/2010 0940   NITRITE POSITIVE (A) 09/10/2022 0659   LEUKOCYTESUR TRACE (A) 09/10/2022 0659    Sepsis Labs: Lactic Acid, Venous No results found for: "LATICACIDVEN"  MICROBIOLOGY: No results found for this or any previous visit (from the past 240 hour(s)).  RADIOLOGY STUDIES/RESULTS: MR ANGIO HEAD WO CONTRAST  Result Date: 09/10/2022 CLINICAL DATA:  Stroke/TIA, determine embolic source EXAM: MRA HEAD WITHOUT CONTRAST TECHNIQUE: Angiographic images of the Circle of Willis were acquired using MRA technique without intravenous contrast. COMPARISON:  MRA head 08/09/22 FINDINGS: Anterior circulation: No significant stenosis. No proximal occlusion. No aneurysm. Posterior circulation: No significant stenosis. No proximal occlusion. No aneurysm. Anatomic variants: No Other: None. IMPRESSION: No intracranial large vessel occlusion or significant stenosis. Consider further  evaluation with an MRA of the neck, this is not previously performed Electronically Signed   By: Lorenza Cambridge M.D.   On: 09/10/2022 18:51   MR BRAIN WO CONTRAST  Result Date: 09/10/2022 CLINICAL DATA:  Neuro deficit, acute, stroke suspected EXAM: MRI HEAD WITHOUT CONTRAST TECHNIQUE: Multiplanar, multiecho pulse sequences of the brain and surrounding structures were obtained without intravenous contrast. COMPARISON:  MR Brain 08/08/22 FINDINGS: Brain: Acute infarcts in the bilateral corona radiata and in the left parietal lobe, new compared to 08/08/2022. there is chronic infarct in the posterior left parietal lobe, posterior right parietal lobe, and in the right cerebellum. No hemorrhage. No extra-axial fluid collection. No hydrocephalus. There is sequela of severe chronic microvascular ischemic change. Vascular: Normal flow voids. Skull and upper cervical spine: Normal  marrow signal. Sinuses/Orbits: No middle ear or mastoid effusion. Paranasal sinuses are clear. Bilateral lens replacement. Orbits are otherwise unremarkable Other: None. IMPRESSION: Acute infarcts in the bilateral corona radiata and in the left parietal lobe, new compared to 08/08/2022. No hemorrhage. Electronically Signed   By: Lorenza Cambridge M.D.   On: 09/10/2022 12:00   DG Chest 1 View  Result Date: 09/10/2022 CLINICAL DATA:  Right-sided weakness.  Right-sided chest pain. EXAM: CHEST  1 VIEW COMPARISON:  08/08/2022 FINDINGS: The lungs are clear without focal pneumonia, edema, pneumothorax or pleural effusion. The cardiopericardial silhouette is within normal limits for size. No acute bony abnormality. Telemetry leads overlie the chest. IMPRESSION: No active disease. Electronically Signed   By: Kennith Center M.D.   On: 09/10/2022 09:04   CT Head Wo Contrast  Result Date: 09/10/2022 CLINICAL DATA:  History follow-up.  Abnormal mental status. EXAM: CT HEAD WITHOUT CONTRAST TECHNIQUE: Contiguous axial images were obtained from the base of the skull through the vertex without intravenous contrast. RADIATION DOSE REDUCTION: This exam was performed according to the departmental dose-optimization program which includes automated exposure control, adjustment of the mA and/or kV according to patient size and/or use of iterative reconstruction technique. COMPARISON:  Brain MRI 08/08/2022 FINDINGS: Brain: No evidence of acute infarction, hemorrhage, hydrocephalus, extra-axial collection or mass lesion/mass effect. Chronic bifrontal and left parietal cortex infarcts. Small vessel infarcts in the deep gray nuclei, reference recent brain MRI showing areas of acuity. Vascular: No hyperdense vessel or unexpected calcification. Skull: Normal. Negative for fracture or focal lesion. Sinuses/Orbits: No acute finding. IMPRESSION: 1. No acute or reversible finding. 2. Chronic and subacute infarcts without noted progression from  brain MRI 08/08/2022 Electronically Signed   By: Tiburcio Pea M.D.   On: 09/10/2022 08:39     LOS: 0 days   Jeoffrey Massed, MD  [facility]    To contact the attending provider between 7A-7P or the covering provider during after hours 7P-7A, please log into the web site [email or web address] and access using universal [place] password for that web site. If you do not have the password, please call the hospital operator.  09/11/2022, 9:28 AM

## 2022-09-11 NOTE — Progress Notes (Addendum)
STROKE TEAM PROGRESS NOTE   INTERVAL HISTORY No family at bedside  Admitted 6/22 for stroke workup after presenting with progressive weakness and recurrent falls. MRI showed acute infarctions in the bilateral corona radiata and in the left parietal lobe   and chronic infarcts bilateral frontal and left parietal lobes.  MRA and CTA negative.  Patient had a recent CVA presenting 08/08/2022 with symptoms of progressive weakness and recurrent falls. She was discharged to inpatient rehab and was then discharged home to live with her sister and POA on August 25, 2022.  Patient reports residual symptoms included some slightly slurred speech and subtle word-finding difficulties in addition to her chronic left lower extremity weakness.  Patient does endorse that she was supposed to have outpatient cardiac monitoring but had not yet received the monitoring device at home. She did take her DAPT for 3 weeks as prescribed and endorses compliance with her medication    Vitals:   09/11/22 0000 09/11/22 0335 09/11/22 0400 09/11/22 0700  BP: (!) 168/53 (!) 180/63 (!) 171/53 (!) 168/62  Pulse: 68  66 69  Resp: 14  14 16   Temp:  97.9 F (36.6 C)  98.4 F (36.9 C)  TempSrc:  Oral  Oral  SpO2: 95%   100%  Weight:      Height:       CBC:  Recent Labs  Lab 09/10/22 0527 09/11/22 0500  WBC 8.0 5.5  NEUTROABS 4.9  --   HGB 10.2* 10.3*  HCT 31.1* 31.0*  MCV 98.4 96.6  PLT 272 224   Basic Metabolic Panel:  Recent Labs  Lab 09/10/22 0527 09/11/22 0500  NA 133* 138  K 4.3 3.5  CL 101 106  CO2 22 24  GLUCOSE 155* 117*  BUN 32* 23  CREATININE 1.82* 1.43*  CALCIUM 8.9 8.8*   Urine Drug Screen:  Recent Labs  Lab 09/10/22 0659  LABOPIA NONE DETECTED  COCAINSCRNUR NONE DETECTED  LABBENZ NONE DETECTED  AMPHETMU NONE DETECTED  THCU NONE DETECTED  LABBARB NONE DETECTED    Alcohol Level  Recent Labs  Lab 09/10/22 0527  ETH <10    IMAGING past 24 hours MR ANGIO HEAD WO CONTRAST  Result  Date: 09/10/2022 CLINICAL DATA:  Stroke/TIA, determine embolic source EXAM: MRA HEAD WITHOUT CONTRAST TECHNIQUE: Angiographic images of the Circle of Willis were acquired using MRA technique without intravenous contrast. COMPARISON:  MRA head 08/09/22 FINDINGS: Anterior circulation: No significant stenosis. No proximal occlusion. No aneurysm. Posterior circulation: No significant stenosis. No proximal occlusion. No aneurysm. Anatomic variants: No Other: None. IMPRESSION: No intracranial large vessel occlusion or significant stenosis. Consider further evaluation with an MRA of the neck, this is not previously performed Electronically Signed   By: Lorenza Cambridge M.D.   On: 09/10/2022 18:51   MR BRAIN WO CONTRAST  Result Date: 09/10/2022 CLINICAL DATA:  Neuro deficit, acute, stroke suspected EXAM: MRI HEAD WITHOUT CONTRAST TECHNIQUE: Multiplanar, multiecho pulse sequences of the brain and surrounding structures were obtained without intravenous contrast. COMPARISON:  MR Brain 08/08/22 FINDINGS: Brain: Acute infarcts in the bilateral corona radiata and in the left parietal lobe, new compared to 08/08/2022. there is chronic infarct in the posterior left parietal lobe, posterior right parietal lobe, and in the right cerebellum. No hemorrhage. No extra-axial fluid collection. No hydrocephalus. There is sequela of severe chronic microvascular ischemic change. Vascular: Normal flow voids. Skull and upper cervical spine: Normal marrow signal. Sinuses/Orbits: No middle ear or mastoid effusion. Paranasal sinuses are clear. Bilateral  lens replacement. Orbits are otherwise unremarkable Other: None. IMPRESSION: Acute infarcts in the bilateral corona radiata and in the left parietal lobe, new compared to 08/08/2022. No hemorrhage. Electronically Signed   By: Lorenza Cambridge M.D.   On: 09/10/2022 12:00    PHYSICAL EXAM  Temp:  [97.8 F (36.6 C)-98.5 F (36.9 C)] 98.4 F (36.9 C) (06/23 0700) Pulse Rate:  [66-75] 69 (06/23  0700) Resp:  [12-16] 16 (06/23 0700) BP: (146-180)/(48-63) 168/62 (06/23 0700) SpO2:  [95 %-100 %] 100 % (06/23 0700)  GENERAL: Awake, alert, in no acute distress   NEURO:  Mental Status: Awake, alert, and oriented to person, place, time, and situation. She is able to provide a clear and coherent history of present illness. Speech/Language: speech is intact. Occasional slowing of responses as conversation continues.     Repetition, fluency, and comprehension intact without aphasia.   No neglect is noted  Cranial Nerves:  II: PERRL. visual fields full.  III, IV, VI: EOMI without gaze preference, diplopia, or nystagmus V: Sensation is intact to light touch and symmetrical to face.  VII: Face is symmetric resting and smiling.   VIII: Hearing is intact to voice IX, X: Palate elevation is symmetric.  XI: Normal sternocleidomastoid and trapezius muscle strength XII: Tongue protrudes midline   Motor: Bilateral lower extremities attempt antigravity movement with minimal elevation of the bed.  Patient reports left lower extremity weakness is chronic, right lower extremity weakness is new.  However, on exam, no overt asymmetry in bilateral lower extremities Left upper extremity elevates antigravity without vertical drift, right upper extremity with minimal vertical drift with antigravity position.  Mild grip strength weakness on the right compared to the left.  .  Tone is normal. Bulk is normal.   Sensation: Intact to light touch bilaterally in all four extremities.  Coordination: FTN intact bilaterally.  Able to perform HKS bilaterally Gait: Deferred for patient's safety   ASSESSMENT/PLAN [name] is a 72 y.o. female with PMHx of breast cancer, CKD stage II, HLD, HTN, OSA, MDD, DM2, obesity, and recent CVA in May 2024 s/p rehab discharge August 25, 2022 who presented to the ED on 09/10/2022 for evaluation of new onset of right-sided weakness. Imaging on ED arrival revealed acute  infarcts in the bilateral corona radiate and left parietal and patient was admitted for further stroke management.  Of note, patient completed 3 weeks of DAPT at discharge followed by ASA monotherapy. Patient was discharged with a recommendation for a 30-day event monitor but patient reports she has not yet initiated outpatient cardiac monitoring at this time.   Recurrent stroke - new left BG and left parietal small / punctate infarcts, concerning for Cardio-embolic source.   Code Stroke CT head No acute or reversible finding.Chronic and subacute infarcts without noted progression from brain MRI 08/08/2022 CTA head & neck No emergent vascular finding. There is atherosclerosis without flow reducing stenosis or ulceration of major arteries in the head and neck. MRI  Acute infarcts in the bilateral corona radiata and in the left parietal lobe, new compared to 08/08/2022. No hemorrhage. MRA  Head No LVO or significant stenosis Carotid Doppler 5/21: right Carotid 1-39%, Left Carotid 40-59% stenosis.  LE venous Doppler pending 2D Echo 5/21: EF 60-65%, Mild LVH, Grade I diastolic dysfunction, Negative bubble study.  Loop recorder recommended prior to discharge LDL 58 HgbA1c 7.0 VTE prophylaxis - lovenox aspirin 81 mg daily prior to admission, now on aspirin 81 mg daily and clopidogrel 75  mg daily for 3 weeks and then Plavix alone Therapy recommendations:  CIR Disposition:  floor  Recent CVA 07/2022 admitted for weakness and falling at home.  CT showed multiple old infarcts bilaterally.  MRI showed multifocal bilateral acute infarcts, predominantly affecting gray matter. Also 2 cortical foci within the right MCA territory. Old bilateral MCA territory infarcts. Mild volume loss and chronic ischemic microangiopathy. MRA WNL.  Carotid Doppler left 40 to 59% stenosis.  EF 60 to 65%, LDL 58, A1c 7.0.  Discharged on DAPT therapy, which has finished and was on aspirin at home.  Recommend 30-day Cardiac event  monitoring as outpatient.  Hypertension History of orthostatic hypotension Home meds:  amlodipine 10mg , coreg 6.25mg , losartan 25mg --on hold Stable on the high side Gradually normalize BP in 2 to 3 days Orthostatic vitals 163/68 supine, 155/67 sitting, 128/107 standing, 129/62 standing ~3 min  Long-term BP goal normotensive.  Avoid hypotension /dehydration  Hyperlipidemia Home meds:  lipitor 80mg , resumed in hospital LDL 58, goal < 70 Continue statin at discharge  Diabetes type II controlled Home meds:  metformin 1000mg  HgbA1c 7.0, goal < 7.0 CBGs SSI Close PCP follow-up for better DM control  Other Stroke Risk Factors Advanced Age >/= 30  Obesity, Body mass index is 33.52 kg/m., BMI >/= 30 associated with increased stroke risk, recommend weight loss, diet and exercise as appropriate  Obstructive sleep apnea, not on CPAP at home  Other Active Problems Depression Lexapro continued Breast cancer on tamoxifen  Hospital day # 0  Pt seen by Neuro NP/APP and later by MD. Note/plan to be edited by MD as needed.    Holly January, DNP, AGACNP-BC [facility] Please use AMION for contact information & EPIC for messaging.  ATTENDING NOTE: I reviewed above note and agree with the assessment and plan. Pt was seen and examined.   Sister at bedside.  Patient lying bed, continues to report whole body weakness.  On exam, patient AAOx3, fluent language, no aphasia, follows all simple commands, able to name repeat.  Visual fields full, no gaze palsy, facial symmetrical.  Tongue midline.  Bilateral upper extremity no drift, bilateral lower extremity proximal 3/5, distal 4+/5.  Sensation decreased bilateral lower extremity due to chronic neuropathy.  Finger-to-nose intact but slow on the right.  Patient had a recent bilateral hemisphere stroke, discharged on DAPT and statin, recommend 30-day cardiac event monitoring which was not started yet.  This time was admitted for new  bilateral small infarcts, will check LE venous Doppler and then consider loop recorder.  Continue DAPT and statin for now.  Patient has history of orthostatic hypotension, currently orthostatic vitals did show drop of SBP > 20 on standing but asymptomatic.  Management per primary team.  Will follow  For detailed assessment and plan, please refer to above/below as I have made changes wherever appropriate.   Marvel Plan, MD PhD Stroke Neurology 09/11/2022 5:14 PM    To contact Stroke Continuity provider, please refer to [email or web address]. After hours, contact General Neurology

## 2022-09-11 NOTE — Progress Notes (Signed)
VASCULAR LAB    Bilateral lower extremity venous duplex has been performed.  See CV proc for preliminary results.   Margy Sumler, RVT 09/11/2022, 5:30 PM

## 2022-09-12 ENCOUNTER — Telehealth: Payer: Self-pay | Admitting: Podiatry

## 2022-09-12 ENCOUNTER — Ambulatory Visit: Payer: PPO | Admitting: Neurology

## 2022-09-12 DIAGNOSIS — E1165 Type 2 diabetes mellitus with hyperglycemia: Secondary | ICD-10-CM | POA: Diagnosis not present

## 2022-09-12 DIAGNOSIS — F331 Major depressive disorder, recurrent, moderate: Secondary | ICD-10-CM | POA: Diagnosis not present

## 2022-09-12 DIAGNOSIS — N1831 Chronic kidney disease, stage 3a: Secondary | ICD-10-CM | POA: Diagnosis not present

## 2022-09-12 DIAGNOSIS — E1142 Type 2 diabetes mellitus with diabetic polyneuropathy: Secondary | ICD-10-CM | POA: Diagnosis not present

## 2022-09-12 DIAGNOSIS — Z8673 Personal history of transient ischemic attack (TIA), and cerebral infarction without residual deficits: Secondary | ICD-10-CM | POA: Diagnosis not present

## 2022-09-12 DIAGNOSIS — L97522 Non-pressure chronic ulcer of other part of left foot with fat layer exposed: Secondary | ICD-10-CM

## 2022-09-12 DIAGNOSIS — I639 Cerebral infarction, unspecified: Secondary | ICD-10-CM | POA: Diagnosis not present

## 2022-09-12 LAB — RENAL FUNCTION PANEL
Albumin: 2.8 g/dL — ABNORMAL LOW (ref 3.5–5.0)
Anion gap: 9 (ref 5–15)
BUN: 24 mg/dL — ABNORMAL HIGH (ref 8–23)
CO2: 23 mmol/L (ref 22–32)
Calcium: 8.7 mg/dL — ABNORMAL LOW (ref 8.9–10.3)
Chloride: 104 mmol/L (ref 98–111)
Creatinine, Ser: 1.47 mg/dL — ABNORMAL HIGH (ref 0.44–1.00)
GFR, Estimated: 38 mL/min — ABNORMAL LOW (ref >60)
Glucose, Bld: 133 mg/dL — ABNORMAL HIGH (ref 70–99)
Phosphorus: 3.9 mg/dL (ref 2.5–4.6)
Potassium: 3.5 mmol/L (ref 3.5–5.1)
Sodium: 136 mmol/L (ref 135–145)

## 2022-09-12 LAB — GLUCOSE, CAPILLARY
Glucose-Capillary: 237 mg/dL — ABNORMAL HIGH (ref 70–99)
Glucose-Capillary: 188 mg/dL — ABNORMAL HIGH (ref 70–99)
Glucose-Capillary: 131 mg/dL — ABNORMAL HIGH (ref 70–99)
Glucose-Capillary: 141 mg/dL — ABNORMAL HIGH (ref 70–99)

## 2022-09-12 MED ORDER — ORAL CARE MOUTH RINSE: 15.0000 mL | OROMUCOSAL | Status: DC | PRN

## 2022-09-12 NOTE — Progress Notes (Signed)
PROGRESS NOTE        PATIENT DETAILS Name: [name] Age: 72 y.o. Sex: female Date of Birth: 1950-11-23 Admit Date: 09/10/2022 Admitting Physician [name], MD WUJ:[id number], Aram Beecham, MD  Brief Summary: Patient is a 72 y.o.  female with history of recent CVA, HTN, HLD, CKD stage IIIb-presenting with right-sided weakness-found to have acute CVA.  Significant events: 6/22>> admit to [facility] for right-sided weakness. 5/24-6/6>> hospitalization at CIR 5/20-5/24>> hospitalization for CVA-discharge to CIR  Significant studies: 5/21>> LDL:58 5/21>> A1c: 7.0 5/21>> echo: EF 60-65% 5/21>> carotid Doppler: Left ICA stenosis-40-59% 6/22>> MRI brain: Acute infarct bilateral corona radiator/left parietal lobe. 6/22>> MRI brain: No LVO/significant stenosis 6/23>> CTA head/neck: No emergent vascular finding-no rate limiting stenosis.  Significant microbiology data: None  Procedures: None  Consults: Neurology  Subjective: No issues overnight-has difficulty ambulating-as her right leg is dragging.  Objective: Vitals: Blood pressure (!) 169/69, pulse 71, temperature 97.8 F (36.6 C), temperature source Oral, resp. rate 14, height 5\' 9"  (1.753 m), weight 103 kg, SpO2 94 %.   Exam: Gen Exam:Alert awake-not in any distress HEENT:atraumatic, normocephalic Chest: B/L clear to auscultation anteriorly CVS:S1S2 regular Abdomen:soft non tender, non distended Extremities:no edema Neurology: Slight right-sided weakness Skin: no rash  Pertinent Labs/Radiology:    Latest Ref Rng & Units 09/11/2022    5:00 AM 09/10/2022    5:27 AM 08/22/2022    6:59 AM  CBC  WBC 4.0 - 10.5 K/uL 5.5  8.0  6.7   Hemoglobin 12.0 - 15.0 g/dL 47.8  29.5  8.8   Hematocrit 36.0 - 46.0 % 31.0  31.1  27.1   Platelets 150 - 400 K/uL 224  272  264     Lab Results  Component Value Date   NA 136 09/12/2022   K 3.5 09/12/2022   CL 104 09/12/2022   CO2 23 09/12/2022       Assessment/Plan: Acute CVA-recurrent Slight right-sided weakness-drags leg when walking Workup as above Aspirin/Plavix/statin Loop recorder is scheduled for later today CIR recommended Neurology following.  HTN Allow permissive hypertension  Resume hypertensives in the next day or so.  HLD Statin  DM-2 CBG stable Insulin 70/30 20 units twice daily/SSI  Recent Labs    09/11/22 1646 09/11/22 2115 09/12/22 0823  GLUCAP 129* 170* 237*     DCIS-s/p lumpectomy/XRT Tamoxifen Resume outpatient follow-up with oncology  Depression Stable Wellbutrin/Lexapro  Asymptomatic bacteriuria No symptoms-monitor off antibiotics  CKD 3a At baseline  Obesity: Estimated body mass index is 33.52 kg/m as calculated from the following:   Height as of this encounter: 5\' 9"  (1.753 m).   Weight as of this encounter: 103 kg.   Code status:   Code Status: Full Code   DVT Prophylaxis: enoxaparin (LOVENOX) injection 40 mg Start: 09/10/22 2000   Family Communication: None at bedside   Disposition Plan: Status is: Observation The patient will require care spanning > 2 midnights and should be moved to inpatient because: Severity of illness   Planned Discharge Destination: CIR   Diet: Diet Order             Diet heart healthy/carb modified Room service appropriate? Yes; Fluid consistency: Thin  Diet effective now                     Antimicrobial agents: Anti-infectives (From admission, onward)  None        MEDICATIONS: Scheduled Meds:  aspirin EC  81 mg Oral Daily   atorvastatin  80 mg Oral Daily   buPROPion  300 mg Oral Daily   clopidogrel  75 mg Oral Daily   enoxaparin (LOVENOX) injection  40 mg Subcutaneous Q24H   escitalopram  20 mg Oral Daily   insulin aspart  0-9 Units Subcutaneous TID WC   insulin aspart protamine- aspart  20 Units Subcutaneous BID WC   tamoxifen  20 mg Oral Daily   Continuous Infusions:   PRN Meds:.acetaminophen **OR**  acetaminophen (TYLENOL) oral liquid 160 mg/5 mL **OR** acetaminophen, melatonin   I have personally reviewed following labs and imaging studies  LABORATORY DATA: CBC: Recent Labs  Lab 09/10/22 0527 09/11/22 0500  WBC 8.0 5.5  NEUTROABS 4.9  --   HGB 10.2* 10.3*  HCT 31.1* 31.0*  MCV 98.4 96.6  PLT 272 224     Basic Metabolic Panel: Recent Labs  Lab 09/10/22 0527 09/11/22 0500 09/12/22 0439  NA 133* 138 136  K 4.3 3.5 3.5  CL 101 106 104  CO2 22 24 23   GLUCOSE 155* 117* 133*  BUN 32* 23 24*  CREATININE 1.82* 1.43* 1.47*  CALCIUM 8.9 8.8* 8.7*  PHOS  --   --  3.9     GFR: Estimated Creatinine Clearance: 44.8 mL/min (A) (by C-G formula based on SCr of 1.47 mg/dL (H)).  Liver Function Tests: Recent Labs  Lab 09/10/22 0527 09/12/22 0439  AST 18  --   ALT 18  --   ALKPHOS 42  --   BILITOT 0.7  --   PROT 6.9  --   ALBUMIN 3.1* 2.8*    No results for input(s): "LIPASE", "AMYLASE" in the last 168 hours. No results for input(s): "AMMONIA" in the last 168 hours.  Coagulation Profile: Recent Labs  Lab 09/10/22 0527  INR 1.1     Cardiac Enzymes: No results for input(s): "CKTOTAL", "CKMB", "CKMBINDEX", "TROPONINI" in the last 168 hours.  BNP (last 3 results) No results for input(s): "PROBNP" in the last 8760 hours.  Lipid Profile: No results for input(s): "CHOL", "HDL", "LDLCALC", "TRIG", "CHOLHDL", "LDLDIRECT" in the last 72 hours.  Thyroid Function Tests: No results for input(s): "TSH", "T4TOTAL", "FREET4", "T3FREE", "THYROIDAB" in the last 72 hours.  Anemia Panel: No results for input(s): "VITAMINB12", "FOLATE", "FERRITIN", "TIBC", "IRON", "RETICCTPCT" in the last 72 hours.  Urine analysis:    Component Value Date/Time   COLORURINE YELLOW 09/10/2022 0659   APPEARANCEUR CLEAR 09/10/2022 0659   LABSPEC 1.006 09/10/2022 0659   PHURINE 5.0 09/10/2022 0659   GLUCOSEU NEGATIVE 09/10/2022 0659   HGBUR NEGATIVE 09/10/2022 0659   BILIRUBINUR  NEGATIVE 09/10/2022 0659   KETONESUR NEGATIVE 09/10/2022 0659   PROTEINUR NEGATIVE 09/10/2022 0659   UROBILINOGEN 0.2 03/24/2010 0940   NITRITE POSITIVE (A) 09/10/2022 0659   LEUKOCYTESUR TRACE (A) 09/10/2022 0659    Sepsis Labs: Lactic Acid, Venous No results found for: "LATICACIDVEN"  MICROBIOLOGY: No results found for this or any previous visit (from the past 240 hour(s)).  RADIOLOGY STUDIES/RESULTS: VAS [place] LOWER EXTREMITY VENOUS (DVT)  Result Date: 09/11/2022  Lower Venous DVT Study Patient Name:  [name]  Date of Exam:   09/11/2022 Medical Rec #: [id number]        Accession #:    [id number] Date of Birth: March 27, 1950        Patient Gender: F Patient Age:   54 years Exam Location:  [facility] Procedure:      VAS [place] LOWER EXTREMITY VENOUS (DVT) Referring Phys: Lanae Boast --------------------------------------------------------------------------------  Indications: Edema, and stroke.  Limitations: Body habitus and patient unable to position legs. Comparison Study: Negative left LEV done 09/13/17 Performing Technologist: Sherren Kerns RVS  Examination Guidelines: A complete evaluation includes B-mode imaging, spectral Doppler, color Doppler, and power Doppler as needed of all accessible portions of each vessel. Bilateral testing is considered an integral part of a complete examination. Limited examinations for reoccurring indications may be performed as noted. The reflux portion of the exam is performed with the patient in reverse Trendelenburg.  +---------+---------------+---------+-----------+----------+-------------------+ RIGHT    CompressibilityPhasicitySpontaneityPropertiesThrombus Aging      +---------+---------------+---------+-----------+----------+-------------------+ CFV      Full           Yes      Yes                                      +---------+---------------+---------+-----------+----------+-------------------+ SFJ      Full                                                              +---------+---------------+---------+-----------+----------+-------------------+ FV Prox  Full                                                             +---------+---------------+---------+-----------+----------+-------------------+ FV Mid   Full           Yes      Yes                                      +---------+---------------+---------+-----------+----------+-------------------+ FV DistalFull                                                             +---------+---------------+---------+-----------+----------+-------------------+ PFV      Full                                                             +---------+---------------+---------+-----------+----------+-------------------+ POP                     Yes      Yes                  patent by color and  Doppler             +---------+---------------+---------+-----------+----------+-------------------+ PTV                                                   Not well visualized +---------+---------------+---------+-----------+----------+-------------------+ PERO                                                  Not well visualized +---------+---------------+---------+-----------+----------+-------------------+   +---------+---------------+---------+-----------+----------+-------------------+ LEFT     CompressibilityPhasicitySpontaneityPropertiesThrombus Aging      +---------+---------------+---------+-----------+----------+-------------------+ CFV      Full           Yes      Yes                                      +---------+---------------+---------+-----------+----------+-------------------+ SFJ      Full                                                             +---------+---------------+---------+-----------+----------+-------------------+ FV Prox  Full                                                              +---------+---------------+---------+-----------+----------+-------------------+ FV Mid   Full                                                             +---------+---------------+---------+-----------+----------+-------------------+ FV DistalFull                                                             +---------+---------------+---------+-----------+----------+-------------------+ PFV      Full                                                             +---------+---------------+---------+-----------+----------+-------------------+ POP                                                   patent by color and  Doppler             +---------+---------------+---------+-----------+----------+-------------------+ PTV                                                   Not well visualized +---------+---------------+---------+-----------+----------+-------------------+ PERO                                                  Not well visualized +---------+---------------+---------+-----------+----------+-------------------+     Summary: BILATERAL: - No evidence of deep vein thrombosis seen in the lower extremities, bilaterally. -No evidence of popliteal cyst, bilaterally.   *See table(s) above for measurements and observations. Electronically signed by Waverly Ferrari MD on 09/11/2022 at 8:39:28 PM.    Final    CT ANGIO HEAD NECK W WO CM  Result Date: 09/11/2022 CLINICAL DATA:  Stroke, determine embolic source. EXAM: CT ANGIOGRAPHY HEAD AND NECK WITH AND WITHOUT CONTRAST TECHNIQUE: Multidetector CT imaging of the head and neck was performed using the standard protocol during bolus administration of intravenous contrast. Multiplanar CT image reconstructions and MIPs were obtained to evaluate the vascular anatomy. Carotid stenosis measurements (when applicable) are obtained  utilizing NASCET criteria, using the distal internal carotid diameter as the denominator. RADIATION DOSE REDUCTION: This exam was performed according to the departmental dose-optimization program which includes automated exposure control, adjustment of the mA and/or kV according to patient size and/or use of iterative reconstruction technique. CONTRAST:  75mL OMNIPAQUE IOHEXOL 350 MG/ML SOLN COMPARISON:  Brain MRI from yesterday FINDINGS: CT HEAD FINDINGS Brain: Small acute infarcts are largely occult compared to brain MRI. Multiple chronic infarcts including along the bilateral frontal parietal cortex (more extensive on the left) and at the left corona radiata. Small chronic cerebellar infarcts. No acute hemorrhage, hydrocephalus, or masslike finding. Vascular: No hyperdense vessel or unexpected calcification. Skull: Normal. Negative for fracture or focal lesion. Sinuses/Orbits: No acute finding Review of the MIP images confirms the above findings CTA NECK FINDINGS Aortic arch: Atheromatous plaque with 3 vessel branching. Right carotid system: Atheromatous calcification at the carotid bifurcation. No flow reducing stenosis or ulceration. Left carotid system: Moderate mixed density plaque primarily at the bifurcation. No stenosis or ulceration. Vertebral arteries: No proximal subclavian stenosis although there is atherosclerosis on the right. The vertebral arteries are smoothly contoured and widely patent Skeleton: C5-6 ACDF with solid arthrodesis. Other neck: No acute finding Upper chest: No acute finding Review of the MIP images confirms the above findings CTA HEAD FINDINGS Anterior circulation: No branch occlusion, beading, or aneurysm. Atheromatous calcification along the carotid siphons. Hypoplastic right A1 segment Posterior circulation: The vertebral and basilar arteries are smoothly contoured and widely patent. No branch occlusion, beading, or aneurysm. Venous sinuses: Diffusely patent Anatomic variants: As  above Review of the MIP images confirms the above findings IMPRESSION: No emergent vascular finding. There is atherosclerosis without flow reducing stenosis or ulceration of major arteries in the head and neck. Electronically Signed   By: Tiburcio Pea M.D.   On: 09/11/2022 10:22   MR ANGIO HEAD WO CONTRAST  Result Date: 09/10/2022 CLINICAL DATA:  Stroke/TIA, determine embolic source EXAM: MRA HEAD WITHOUT CONTRAST TECHNIQUE: Angiographic images of the Circle of Willis were acquired using MRA technique without intravenous  contrast. COMPARISON:  MRA head 08/09/22 FINDINGS: Anterior circulation: No significant stenosis. No proximal occlusion. No aneurysm. Posterior circulation: No significant stenosis. No proximal occlusion. No aneurysm. Anatomic variants: No Other: None. IMPRESSION: No intracranial large vessel occlusion or significant stenosis. Consider further evaluation with an MRA of the neck, this is not previously performed Electronically Signed   By: Lorenza Cambridge M.D.   On: 09/10/2022 18:51   MR BRAIN WO CONTRAST  Result Date: 09/10/2022 CLINICAL DATA:  Neuro deficit, acute, stroke suspected EXAM: MRI HEAD WITHOUT CONTRAST TECHNIQUE: Multiplanar, multiecho pulse sequences of the brain and surrounding structures were obtained without intravenous contrast. COMPARISON:  MR Brain 08/08/22 FINDINGS: Brain: Acute infarcts in the bilateral corona radiata and in the left parietal lobe, new compared to 08/08/2022. there is chronic infarct in the posterior left parietal lobe, posterior right parietal lobe, and in the right cerebellum. No hemorrhage. No extra-axial fluid collection. No hydrocephalus. There is sequela of severe chronic microvascular ischemic change. Vascular: Normal flow voids. Skull and upper cervical spine: Normal marrow signal. Sinuses/Orbits: No middle ear or mastoid effusion. Paranasal sinuses are clear. Bilateral lens replacement. Orbits are otherwise unremarkable Other: None. IMPRESSION:  Acute infarcts in the bilateral corona radiata and in the left parietal lobe, new compared to 08/08/2022. No hemorrhage. Electronically Signed   By: Lorenza Cambridge M.D.   On: 09/10/2022 12:00     LOS: 1 day   Jeoffrey Massed, MD  [facility]    To contact the attending provider between 7A-7P or the covering provider during after hours 7P-7A, please log into the web site [email or web address] and access using universal [place] password for that web site. If you do not have the password, please call the hospital operator.  09/12/2022, 10:57 AM

## 2022-09-12 NOTE — Progress Notes (Signed)
  Inpatient Rehabilitation Admissions Coordinator   Met with patient at bedside for rehab assessment. I Know patient from recent CIR admit. We discussed goals and expectations of a possible CIR admit. She prefers CIR for rehab and then to return home with sister. LOOP pending.. Family can provide expected caregiver support that is recommended of supervision level. I will begin insurance Auth with [employer] for possible CIR admit pending approval. Please call me with any questions.   Ottie Glazier, RN, MSN Rehab Admissions Coordinator [phone]

## 2022-09-12 NOTE — Consult Note (Signed)
PODIATRY CONSULTATION  NAME [name] MRN [id number] DOB Jan 29, 1951 DOA 09/10/2022   Reason for consult: Diabetic foot ulcers B/L Chief Complaint  Patient presents with   Extremity Weakness    RIGHT   Transient Ischemic Attack   History of present illness: 72 y.o. female PMHx recent CVA, CKD stage IIIb, R-sided weakness, managed outpatient for diabetic foot wounds bilateral.  Podiatry consulted for wound evaluation and wound recommendations while inpatient  Past Medical History:  Diagnosis Date   AKI (acute kidney injury) (HCC) 08/09/2022   Breast cancer (HCC) 12/2019   left breast DCIS   CKD (chronic kidney disease) 06/07/2016   Stage 2, GFR 60-89 ml/min   Diabetic polyneuropathy 06/07/2016   Dyslipidemia    Endometrial adenocarcinoma 2012   Fever blister    Hyperlipidemia    Hypertension, essential 06/07/2016   Major depressive disorder    Mixed dyslipidemia 06/07/2016   MRSA infection 10/2019   left foot wound   Obstructive sleep apnea 06/07/2016   does not use CPAP   Peripheral neuropathy    Retinopathy    Right rib fracture 08/09/2022   Severe obesity    Skin ulcer of left foot with fat layer exposed 11/02/2016   Stroke    Asymptomatic, discovered via neuroimaging; small infarct in left parietal lobe; also concern for small b/l frontal infarcts   Tachycardia 07/19/2017   Traumatic rhabdomyolysis (HCC) 08/09/2022   Type 2 diabetes mellitus with hyperglycemia, with long-term current use of insulin 06/07/2016   Unilateral primary osteoarthritis, right knee 04/16/2019   UTI (urinary tract infection) 08/09/2022       Latest Ref Rng & Units 09/11/2022    5:00 AM 09/10/2022    5:27 AM 08/22/2022    6:59 AM  CBC  WBC 4.0 - 10.5 K/uL 5.5  8.0  6.7   Hemoglobin 12.0 - 15.0 g/dL 86.5  78.4  8.8   Hematocrit 36.0 - 46.0 % 31.0  31.1  27.1   Platelets 150 - 400 K/uL 224  272  264        Latest Ref Rng & Units 09/12/2022    4:39 AM 09/11/2022    5:00 AM  09/10/2022    5:27 AM  BMP  Glucose 70 - 99 mg/dL 696  295  284   BUN 8 - 23 mg/dL 24  23  32   Creatinine 0.44 - 1.00 mg/dL 1.32  4.40  1.02   Sodium 135 - 145 mmol/L 136  138  133   Potassium 3.5 - 5.1 mmol/L 3.5  3.5  4.3   Chloride 98 - 111 mmol/L 104  106  101   CO2 22 - 32 mmol/L 23  24  22    Calcium 8.9 - 10.3 mg/dL 8.7  8.8  8.9            Physical Exam: General: The patient is alert and oriented x3 in no acute distress.   Dermatology: Ulcers noted bilateral feet.  Wound stable.  Became out of photos  Vascular: VAS [place] ABI W/WO TBI 07/26/2022  ABI Findings:  +---------+------------------+-----+---------+---------+  Right   Rt Pressure (mmHg)IndexWaveform Comment    +---------+------------------+-----+---------+---------+  Brachial 234                                        +---------+------------------+-----+---------+---------+  PTA     248  1.04 biphasic            +---------+------------------+-----+---------+---------+  PERO    222               0.93 triphasic           +---------+------------------+-----+---------+---------+  DP      216               0.90 biphasic            +---------+------------------+-----+---------+---------+  Great Toe0                 0.00          amputated  +---------+------------------+-----+---------+---------+   +---------+------------------+-----+---------+-------+  Left    Lt Pressure (mmHg)IndexWaveform Comment  +---------+------------------+-----+---------+-------+  Brachial 239                                      +---------+------------------+-----+---------+-------+  PTA     237               0.99 triphasic         +---------+------------------+-----+---------+-------+  PERO    248               1.06 biphasic          +---------+------------------+-----+---------+-------+  DP      246               1.03 biphasic           +---------+------------------+-----+---------+-------+  Great Toe167               0.70 Abnormal          +---------+------------------+-----+---------+-------+   +-------+-----------+-----------+------------+------------+  ABI/TBIToday's ABIToday's TBIPrevious ABIPrevious TBI  +-------+-----------+-----------+------------+------------+  Right 1.04       amputated  .94                       +-------+-----------+-----------+------------+------------+  Left  1.04       .70        1.05                      +-------+-----------+-----------+------------+------------+   TOES Findings:  +----------+---------------+--------+---------+  Right ToesPressure (mmHg)WaveformComment    +----------+---------------+--------+---------+  1st Digit                        amputated  +----------+---------------+--------+---------+  2nd Digit                Normal             +----------+---------------+--------+---------+  3rd Digit                Normal             +----------+---------------+--------+---------+  4th Digit                Abnormal           +----------+---------------+--------+---------+  5th Digit                Normal             +----------+---------------+--------+---------+   +---------+---------------+--------+-------+  Left ToesPressure (mmHg)WaveformComment  +---------+---------------+--------+-------+  1st Digit               Normal           +---------+---------------+--------+-------+  2nd Digit  Abnormal         +---------+---------------+--------+-------+  3rd Digit               Normal           +---------+---------------+--------+-------+  4th Digit               Normal           +---------+---------------+--------+-------+  5th Digit               Normal           +---------+---------------+--------+-------+  Right ABIs appear increased compared to prior study on  12/01/2021. Left  ABIs appear essentially unchanged compared to prior study on 12/01/2021.    Summary:  Right: Resting right ankle-brachial index is within normal range.  Left: Resting left ankle-brachial index is within normal range. The left toe-brachial index is normal.    Neurological: Light touch and protective threshold diminished bilaterally.   Musculoskeletal Exam: Minimally ambulatory.  PT eval today  ASSESSMENT/PLAN OF CARE 1.  Diabetic foot ulcers bilateral  -Wound stable. -Wound care orders placed for Xeroform with Mepilex bandages to all ulcers bilateral.  Change daily -Plan for inpatient rehab.  Continue wound care orders. -Podiatry will sign off.    Thank you for the consult.  Please contact me directly via secure chat with any questions or concerns.     Felecia Shelling, DPM [facility]  Dr. Felecia Shelling, DPM    [address] [place], [place] [address]                Office [phone]  Fax [phone]

## 2022-09-12 NOTE — Evaluation (Signed)
Speech Language Pathology Evaluation Patient Details Name: [name] MRN: [id number] DOB: 1950-12-14 Today's Date: 09/12/2022 Time: 8295-6213 SLP Time Calculation (min) (ACUTE ONLY): 12 min  Problem List:  Patient Active Problem List   Diagnosis Date Noted   Acute CVA (cerebrovascular accident) (HCC) 09/10/2022   Major depressive disorder, recurrent episode, moderate (HCC) 08/24/2022   Memory loss due to medical condition 08/24/2022   History of CVA (cerebrovascular accident) 08/12/2022   Pressure injury of skin 08/09/2022   CKD stage 3a, GFR 45-59 ml/min (HCC) 08/09/2022   Lower extremity edema 08/09/2022   Generalized weakness 08/08/2022   Neoplasm of uncertain behavior of skin 12/08/2020   Seborrheic keratosis 12/08/2020   Ductal carcinoma in situ (DCIS) of left breast 01/02/2020   MRSA (methicillin resistant staph aureus) culture positive 10/29/2019   Unilateral primary osteoarthritis, right knee 04/16/2019   Morbid (severe) obesity due to excess calories (HCC) 04/16/2019   Mixed hyperlipidemia 07/19/2017   Palpitations 07/19/2017   Skin ulcer of left foot with fat layer exposed 11/02/2016   Type 2 diabetes mellitus with hyperglycemia, with long-term current use of insulin 06/07/2016   Diabetic peripheral neuropathy (HCC) 06/07/2016   Hypertension, essential 06/07/2016   Obstructive sleep apnea 06/07/2016   Past Medical History:  Past Medical History:  Diagnosis Date   AKI (acute kidney injury) (HCC) 08/09/2022   Breast cancer (HCC) 12/2019   left breast DCIS   CKD (chronic kidney disease) 06/07/2016   Stage 2, GFR 60-89 ml/min   Diabetic polyneuropathy 06/07/2016   Dyslipidemia    Endometrial adenocarcinoma 2012   Fever blister    Hyperlipidemia    Hypertension, essential 06/07/2016   Major depressive disorder    Mixed dyslipidemia 06/07/2016   MRSA infection 10/2019   left foot wound   Obstructive sleep apnea 06/07/2016   does not use CPAP   Peripheral  neuropathy    Retinopathy    Right rib fracture 08/09/2022   Severe obesity    Skin ulcer of left foot with fat layer exposed 11/02/2016   Stroke    Asymptomatic, discovered via neuroimaging; small infarct in left parietal lobe; also concern for small b/l frontal infarcts   Tachycardia 07/19/2017   Traumatic rhabdomyolysis (HCC) 08/09/2022   Type 2 diabetes mellitus with hyperglycemia, with long-term current use of insulin 06/07/2016   Unilateral primary osteoarthritis, right knee 04/16/2019   UTI (urinary tract infection) 08/09/2022   Past Surgical History:  Past Surgical History:  Procedure Laterality Date   ADRENALECTOMY     BREAST LUMPECTOMY WITH RADIOACTIVE SEED LOCALIZATION Left 02/25/2020   Procedure: LEFT BREAST LUMPECTOMY WITH RADIOACTIVE SEED LOCALIZATION;  Surgeon: Abigail Miyamoto, MD;  Location: [facility];  Service: General;  Laterality: Left;   BUNIONECTOMY     CARDIAC CATHETERIZATION     CERVICAL SPINE SURGERY     CHOLECYSTECTOMY     DILATION AND CURETTAGE OF UTERUS     LAPAROSCOPIC SALPINGOOPHERECTOMY     THORACOTOMY     TONSILLECTOMY     HPI:  Pt is a 72 y.o. female who presented 09/10/22 with R-sided weakness. MRI showed new acute infarcts in the bilateral corona radiata and in the left parietal lobe. Pt recently admitted 08/08/22  with CVA and R rib fx and discharged to [facility] then home. PMH: ductal carcinoma in situ of left breast, DM2, obesity, CKD, HLD, HTN, OSA   Assessment / Plan / Recommendation Clinical Impression  Pt participated in speech/language/cognitive evalution with no overt deficits detected. Speech  is fluent, no dysarthria.  Followed complex commands with deliberation.  Answered questions about biographical hx appropriately. Oral reading WNL. Oriented x4;  attention and short-term recall WFL. No needs identified that would require SLP f/u.    SLP Assessment  SLP Recommendation/Assessment: Patient does not need any further Speech  [place] SLP Visit Diagnosis: Cognitive communication deficit (R41.841)    Recommendations for follow up therapy are one component of a multi-disciplinary discharge planning process, led by the attending physician.  Recommendations may be updated based on patient status, additional functional criteria and insurance authorization.    Follow Up Recommendations  No SLP follow up                       SLP Evaluation Cognition  Overall Cognitive Status: Within Functional Limits for tasks assessed Arousal/Alertness: Awake/alert Orientation Level: Oriented X4 Attention: Selective Sustained Attention: Appears intact Selective Attention: Appears intact Memory: Appears intact Awareness: Appears intact       Comprehension  Auditory Comprehension Overall Auditory Comprehension: Appears within functional limits for tasks assessed Yes/No Questions: Within Functional Limits Commands: Within Functional Limits Conversation: Simple Visual Recognition/Discrimination Discrimination: Within Function Limits Reading Comprehension Reading Status: Within funtional limits    Expression Expression Primary Mode of Expression: Verbal Verbal Expression Overall Verbal Expression: Appears within functional limits for tasks assessed Initiation: No impairment Level of Generative/Spontaneous Verbalization: Conversation Repetition: No impairment Naming: No impairment Pragmatics: No impairment Written Expression Written Expression: Not tested   Oral / Motor  Oral Motor/Sensory Function Overall Oral Motor/Sensory Function: Within functional limits Motor Speech Overall Motor Speech: Appears within functional limits for tasks assessed            Blenda Mounts Laurice 09/12/2022, 11:59 AM Marchelle Folks L. Samson Frederic, MA CCC/SLP Clinical Specialist - Acute Care SLP Acute Rehabilitation Services Office number [phone]

## 2022-09-12 NOTE — Progress Notes (Signed)
Physical Therapy Treatment Patient Details Name: [name] MRN: [id number] DOB: 1950-07-29 Today's Date: 09/12/2022   History of Present Illness Pt is a 72 y.o. female who presented 09/10/22 with R-sided weakness. MRI showed new acute infarcts in the bilateral corona radiata and in the left parietal lobe. Pt recently admitted 08/08/22  with CVA and R rib fx and discharged to [facility] then home. PMH: ductal carcinoma in situ of left breast, DM2, obesity, CKD, HLD, HTN, OSA    PT Comments    Pt progressing well towards all goals. Pt continues to have R UE and LE weakness, impaired balance, increased falls risk, and requires RW for safe ambulation. Session focused on R LE strengthening and improving R LE kinematics during ambulation. Pt remains appropriate for current d/c recommendations. Acute PT to cont to follow.    Recommendations for follow up therapy are one component of a multi-disciplinary discharge planning process, led by the attending physician.  Recommendations may be updated based on patient status, additional functional criteria and insurance authorization.  Follow Up Recommendations       Assistance Recommended at Discharge Frequent or constant Supervision/Assistance  Patient can return home with the following A lot of help with walking and/or transfers;A little help with bathing/dressing/bathroom;Assistance with cooking/housework;Help with stairs or ramp for entrance;Assist for transportation   Equipment Recommendations  None recommended by PT    Recommendations for Other Services Rehab consult     Precautions / Restrictions Precautions Precautions: Fall Restrictions Weight Bearing Restrictions: No     Mobility  Bed Mobility Overal bed mobility: Needs Assistance Bed Mobility: Supine to Sit     Supine to sit: Min assist     General bed mobility comments: pt able to bring LEs off EOB, minA for trunk elevation, tactile cues to bring L UE across to R hand rail to  push up    Transfers Overall transfer level: Needs assistance Equipment used: Rolling walker (2 wheels) Transfers: Sit to/from Stand Sit to Stand: Mod assist           General transfer comment: initially modA to power up from bed height, once bed elevated pt minA to power up, increased time    Ambulation/Gait Ambulation/Gait assistance: Min assist Gait Distance (Feet): 80 Feet Assistive device: Rolling walker (2 wheels) Gait Pattern/deviations: Decreased step length - right, Step-through pattern, Decreased dorsiflexion - right, Trunk flexed, Decreased stride length Gait velocity: reduced Gait velocity interpretation: <1.31 ft/sec, indicative of household ambulator   General Gait Details: pt with increased trunk flexion, verbal cues to hold head up and look forward, pt with short step length bilaterally however pt scuffing R foot freq due to decreased step height. Constant verbal cues given to increase R foot step height, more difficulty with increased distance as fatigue set int   Stairs             Wheelchair Mobility    Modified Rankin (Stroke Patients Only) Modified Rankin (Stroke Patients Only) Pre-Morbid Rankin Score: Moderate disability Modified Rankin: Moderately severe disability     Balance Overall balance assessment: Needs assistance Sitting-balance support: Single extremity supported, Feet supported Sitting balance-Leahy Scale: Fair Sitting balance - Comments: pt able to maintain EOB balance this date without use of UEs while feet were supported on the floor Postural control: Posterior lean Standing balance support: Bilateral upper extremity supported, During functional activity Standing balance-Leahy Scale: Poor Standing balance comment: reliant on RW for safe ambulation  Cognition Arousal/Alertness: Awake/alert Behavior During Therapy: WFL for tasks assessed/performed Overall Cognitive Status: Within  Functional Limits for tasks assessed                                 General Comments: Slow processing, however suspect pt moves slow at baseline        Exercises General Exercises - Lower Extremity Quad Sets: AROM, Both, 10 reps, Supine (with 5 sec hold) Gluteal Sets: Strengthening, Both, 10 reps, Supine (in form of bridges) Long Arc Quad: AROM, Right, 10 reps, Seated    General Comments General comments (skin integrity, edema, etc.): VSS      Pertinent Vitals/Pain Pain Assessment Pain Assessment: No/denies pain    Home Living                          Prior Function            PT Goals (current goals can now be found in the care plan section) Acute Rehab PT Goals Patient Stated Goal: to improve PT Goal Formulation: With patient Time For Goal Achievement: 09/25/22 Potential to Achieve Goals: Good Progress towards PT goals: Progressing toward goals    Frequency    Min 4X/week      PT Plan Current plan remains appropriate    Co-evaluation              AM-PAC PT "6 Clicks" Mobility   Outcome Measure  Help needed turning from your back to your side while in a flat bed without using bedrails?: A Little Help needed moving from lying on your back to sitting on the side of a flat bed without using bedrails?: A Lot Help needed moving to and from a bed to a chair (including a wheelchair)?: A Lot Help needed standing up from a chair using your arms (e.g., wheelchair or bedside chair)?: A Lot Help needed to walk in hospital room?: A Little Help needed climbing 3-5 steps with a railing? : Total 6 Click Score: 13    End of Session Equipment Utilized During Treatment: Gait belt Activity Tolerance: Patient tolerated treatment well Patient left: with call bell/phone within reach;in chair;with chair alarm set Nurse Communication: Mobility status PT Visit Diagnosis: Unsteadiness on feet (R26.81);Other abnormalities of gait and mobility  (R26.89);Muscle weakness (generalized) (M62.81);Difficulty in walking, not elsewhere classified (R26.2);Other symptoms and signs involving the nervous system (R29.898);Hemiplegia and hemiparesis Hemiplegia - Right/Left: Right Hemiplegia - dominant/non-dominant: Dominant Hemiplegia - caused by: Cerebral infarction     Time: 0921-0949 PT Time Calculation (min) (ACUTE ONLY): 28 min  Charges:  $Gait Training: 8-22 mins $Therapeutic Exercise: 8-22 mins                     Lewis Shock, PT, DPT Acute Rehabilitation Services Secure chat preferred Office #: [phone]    Iona Hansen 09/12/2022, 9:59 AM

## 2022-09-12 NOTE — Telephone Encounter (Signed)
Pt had a stroke and in the hospital. Sister wants to know should they let the hospital clean the ulcer while she's there?   Please advise

## 2022-09-12 NOTE — Progress Notes (Signed)
Occupational Therapy Treatment Patient Details Name: [name] MRN: [id number] DOB: 12/20/1950 Today's Date: 09/12/2022   History of present illness Pt is a 72 y.o. female who presented 09/10/22 with R-sided weakness. MRI showed new acute infarcts in the bilateral corona radiata and in the left parietal lobe. Pt recently admitted 08/08/22  with CVA and R rib fx and discharged to [facility] then home. PMH: ductal carcinoma in situ of left breast, DM2, obesity, CKD, HLD, HTN, OSA   OT comments  Patient completed supine to sitting EOB with min A with verbal cues for overall sequencing of placing Les off bed first,patient sitting EOB with Min guard and completed sit to stand wih min A with bed  elevated.  Patient completed functional mobility using RW with min A and slow pace. Patient stood at sink for 1 min to complete UB grooming task of brushing teeth, became fatigued and requires seated rest break.  Throughout grooming task of brushing teeth and washing face, patient alternated sitting and standing to complete task s/p task setup.  Patient standing from 1 mins to 30 seconds.  Patient returned to sitting EOB and required mod A for lifting LEs into bed   Patient continues to make gains toward OT goals.   Recommendations for follow up therapy are one component of a multi-disciplinary discharge planning process, led by the attending physician.  Recommendations may be updated based on patient status, additional functional criteria and insurance authorization.    Assistance Recommended at Discharge Intermittent Supervision/Assistance  Patient can return home with the following  A little help with walking and/or transfers;A little help with bathing/dressing/bathroom   Equipment Recommendations  Other (comment)    Recommendations for Other Services      Precautions / Restrictions Precautions Precautions: Fall Restrictions Weight Bearing Restrictions: No       Mobility Bed Mobility Overal bed  mobility: Needs Assistance Bed Mobility: Supine to Sit     Supine to sit: Min assist Sit to supine: Mod assist   General bed mobility comments:  (assist to lift LEs into bed)    Transfers Overall transfer level: Needs assistance Equipment used: Rolling walker (2 wheels) Transfers: Sit to/from Stand Sit to Stand: Mod assist           General transfer comment: initially modA to power up from bed height, once bed elevated pt minA to power up, increased time     Balance Overall balance assessment: Needs assistance                                         ADL either performed or assessed with clinical judgement   ADL Overall ADL's : Needs assistance/impaired Eating/Feeding: Set up;Sitting   Grooming: Set up;Sitting;Standing (patient alternating between sitting and standing at sink  PAtient standing fro approx 1 min before seated rest break)                               Functional mobility during ADLs: Minimal assistance;Rolling walker (2 wheels);Cueing for safety General ADL Comments: pt limited by decreased endurance, decreased activity tolerance, instability and LE weakness    Extremity/Trunk Assessment Upper Extremity Assessment Upper Extremity Assessment: Generalized weakness RUE Deficits / Details: grossly 4/5 vs LUE 5/5; decreased fine motor coordination, decreased rapid alternating movement, pt reports sensationis intact RUE Sensation: WNL RUE Coordination: decreased  fine motor            Vision       Perception     Praxis      Cognition Arousal/Alertness: Awake/alert Behavior During Therapy: WFL for tasks assessed/performed Overall Cognitive Status: Within Functional Limits for tasks assessed                                          Exercises      Shoulder Instructions       General Comments      Pertinent Vitals/ Pain       Pain Assessment Pain Assessment: No/denies pain  Home Living    Living Arrangements:  (lives with sister and brother in [place])                               Additional Comments: [facility] PT and OT  Lives With: Family    Prior Functioning/Environment              Frequency  Min 2X/week        Progress Toward Goals  OT Goals(current goals can now be found in the care plan section)  Progress towards OT goals: Progressing toward goals  Acute Rehab OT Goals OT Goal Formulation: With patient Time For Goal Achievement: 09/24/22 Potential to Achieve Goals: Good ADL Goals Pt Will Perform Grooming: with modified independence;standing Pt Will Perform Lower Body Dressing: with modified independence;sit to/from stand Pt Will Transfer to Toilet: with modified independence;ambulating Pt/caregiver will Perform Home Exercise Program: Increased strength;Right Upper extremity;Independently  Plan Discharge plan remains appropriate    Co-evaluation                 AM-PAC OT "6 Clicks" Daily Activity     Outcome Measure   Help from another person eating meals?: A Little Help from another person taking care of personal grooming?: A Little Help from another person toileting, which includes using toliet, bedpan, or urinal?: A Lot Help from another person bathing (including washing, rinsing, drying)?: A Little Help from another person to put on and taking off regular upper body clothing?: A Little Help from another person to put on and taking off regular lower body clothing?: A Lot 6 Click Score: 16    End of Session Equipment Utilized During Treatment: Rolling walker (2 wheels);Gait belt  OT Visit Diagnosis: Muscle weakness (generalized) (M62.81)   Activity Tolerance Patient tolerated treatment well   Patient Left in bed;with call bell/phone within reach;with bed alarm set   Nurse Communication Mobility status        Time: 4782-9562 OT Time Calculation (min): 31 min  Charges: OT Treatments $Self Care/Home  Management : 23-37 mins  Governor Specking OT/L  Denice Paradise 09/12/2022, 3:51 PM

## 2022-09-12 NOTE — Consult Note (Addendum)
Physical Medicine and Rehabilitation Consult Reason for Consult: Rehab Referring Physician: Dr. Dewayne Shorter   HPI: [name] is a 72 y.o. female with past medical history of prior CVA, diabetes mellitus with polyneuropathy, CKD 3A, hypertension, hyperlipidemia, breast cancer, obesity, depression, OSA who was admitted to the hospital on 6/22 with right-sided weakness.  Patient had a stroke about a month ago and completed rehabilitation at [facility] with discharge on 08/25/2022.  CT head with no acute changes, prior infarcts noted.  MRI of the brain done on 6/22 showed new acute infarcts in the bilateral corona radiata and left parietal lobe.  Neurology is feels like there may be a cardioembolic source.  CTA showed atherosclerosis without flow reducing stenosis or ulceration of major arteries in the head and neck.  Loop recorder is recommended to discharge.  She is on aspirin and Plavix for 3 weeks then Plavix alone.  Patient reports her right shoulder has been painful with activities.  She has chronic numbness below her knees due to polyneuropathy.  She was evaluated by PT and OT and found to have functional deficits, felt to be a good candidate for CIR.  Lives in a two-story home but stays on the first floor.  Sister can assist after discharge 24/7.  Substandard   Review of Systems  Constitutional:  Negative for chills and fever.  HENT:  Negative for congestion.   Eyes:  Negative for double vision.  Respiratory:  Negative for shortness of breath.   Cardiovascular:  Negative for chest pain.  Gastrointestinal:  Positive for constipation. Negative for abdominal pain, diarrhea, heartburn, nausea and vomiting.  Genitourinary: Negative.   Musculoskeletal:  Positive for joint pain.  Skin:  Negative for rash.  Neurological:  Positive for speech change and weakness. Negative for dizziness, tingling, sensory change and headaches.  Psychiatric/Behavioral:  Positive for depression.    Past Medical  History:  Diagnosis Date   AKI (acute kidney injury) (HCC) 08/09/2022   Breast cancer (HCC) 12/2019   left breast DCIS   CKD (chronic kidney disease) 06/07/2016   Stage 2, GFR 60-89 ml/min   Diabetic polyneuropathy 06/07/2016   Dyslipidemia    Endometrial adenocarcinoma 2012   Fever blister    Hyperlipidemia    Hypertension, essential 06/07/2016   Major depressive disorder    Mixed dyslipidemia 06/07/2016   MRSA infection 10/2019   left foot wound   Obstructive sleep apnea 06/07/2016   does not use CPAP   Peripheral neuropathy    Retinopathy    Right rib fracture 08/09/2022   Severe obesity    Skin ulcer of left foot with fat layer exposed 11/02/2016   Stroke    Asymptomatic, discovered via neuroimaging; small infarct in left parietal lobe; also concern for small b/l frontal infarcts   Tachycardia 07/19/2017   Traumatic rhabdomyolysis (HCC) 08/09/2022   Type 2 diabetes mellitus with hyperglycemia, with long-term current use of insulin 06/07/2016   Unilateral primary osteoarthritis, right knee 04/16/2019   UTI (urinary tract infection) 08/09/2022   Past Surgical History:  Procedure Laterality Date   ADRENALECTOMY     BREAST LUMPECTOMY WITH RADIOACTIVE SEED LOCALIZATION Left 02/25/2020   Procedure: LEFT BREAST LUMPECTOMY WITH RADIOACTIVE SEED LOCALIZATION;  Surgeon: Abigail Miyamoto, MD;  Location: [facility];  Service: General;  Laterality: Left;   BUNIONECTOMY     CARDIAC CATHETERIZATION     CERVICAL SPINE SURGERY     CHOLECYSTECTOMY     DILATION AND CURETTAGE OF UTERUS  LAPAROSCOPIC SALPINGOOPHERECTOMY     THORACOTOMY     TONSILLECTOMY     Family History  Problem Relation Age of Onset   Hypertension Mother    Alzheimer's disease Mother    Heart attack Father    CAD Father    Alzheimer's disease Father    Bladder Cancer Father        dx late 48s   Diverticulitis Sister    Obesity Sister    Hypertension Sister    Voice disorder Brother     Breast cancer Paternal Aunt 15   Prostate cancer Paternal Uncle        dx mid 12s   Social History:  reports that she has never smoked. She has never used smokeless tobacco. She reports that she does not drink alcohol and does not use drugs. Allergies:  Allergies  Allergen Reactions   Elavil [Amitriptyline] Other (See Comments)    Unknown reaction   Pristiq [Desvenlafaxine] Other (See Comments)    Unknown allergy   Cipro [Ciprofloxacin Hcl] Rash   Penicillins Rash   Sulfa Antibiotics Rash   Sulfamethoxazole Rash   Sulfur Rash   Medications Prior to Admission  Medication Sig Dispense Refill   amLODipine (NORVASC) 10 MG tablet Take 1 tablet (10 mg total) by mouth daily. 30 tablet 0   ascorbic acid (VITAMIN C) 500 MG tablet Take 1 tablet (500 mg total) by mouth daily. 100 tablet 0   aspirin EC 81 MG tablet Take 1 tablet (81 mg total) by mouth daily. Swallow whole.     atorvastatin (LIPITOR) 80 MG tablet Take 1 tablet (80 mg total) by mouth daily. 30 tablet 0   bacitracin 500 UNIT/GM ointment Apply 1 Application topically daily.     buPROPion (WELLBUTRIN XL) 300 MG 24 hr tablet Take 1 tablet (300 mg total) by mouth daily. 30 tablet 0   carvedilol (COREG) 6.25 MG tablet Take 1 tablet (6.25 mg total) by mouth 2 (two) times daily. 60 tablet 0   Cholecalciferol (VITAMIN D) 125 MCG (5000 UT) CAPS Take 5,000 Units by mouth every evening.     escitalopram (LEXAPRO) 20 MG tablet Take 1 tablet (20 mg total) by mouth daily. 30 tablet 0   insulin NPH-regular Human (NOVOLIN 70/30) (70-30) 100 UNIT/ML injection Inject 20-24 Units into the skin See admin instructions. 24 units in the morning, 20 units in the evening     losartan (COZAAR) 25 MG tablet Take 1 tablet (25 mg total) by mouth every morning. 30 tablet 0   metFORMIN (GLUCOPHAGE) 500 MG tablet Take 1 tablet (500 mg total) by mouth 2 (two) times daily with a meal. 60 tablet 0   tamoxifen (NOLVADEX) 20 MG tablet TAKE ONE TABLET BY MOUTH EVERY  MORNING (Patient taking differently: Take 20 mg by mouth daily.) 90 tablet 0   Zinc Sulfate 220 (50 Zn) MG TABS Take 1 tablet (220 mg total) by mouth daily. 100 tablet 0   clopidogrel (PLAVIX) 75 MG tablet Take 1 tablet (75 mg total) by mouth daily. (Patient not taking: Reported on 09/11/2022) 5 tablet 0   doxycycline (VIBRA-TABS) 100 MG tablet Take 1 tablet (100 mg total) by mouth every 12 (twelve) hours. (Patient not taking: Reported on 09/11/2022) 6 tablet 0   metFORMIN (GLUCOPHAGE) 1000 MG tablet Take 1 tablet (1,000 mg total) by mouth 2 (two) times daily with a meal. (Patient not taking: Reported on 09/11/2022) 60 tablet 0    Home: Home Living Family/patient expects to be discharged  to:: Private residence Living Arrangements: Other relatives (sister) Available Help at Discharge: Family, Available 24 hours/day Type of Home: House Home Access: Stairs to enter [employer] of Steps: 5 Entrance Stairs-Rails: Right, Left Home Layout: One level Bathroom Shower/Tub: Engineer, manufacturing systems:  (bedside commode over top of it during the day) Bathroom Accessibility: Yes Home Equipment: Rollator (4 wheels), Tub bench, BSC/3in1  Lives With: Other (Comment) (sister - caregiver)  Functional History: Prior Function Prior Level of Function : Independent/Modified Independent, Driving Mobility Comments: Mod I with rollator ADLs Comments: tub bench for shower, pt was independent with all ADL except donning ted hose which family would assist with. family also assisted with IADL Functional Status:  Mobility: Bed Mobility Overal bed mobility: Needs Assistance Bed Mobility: Supine to Sit Supine to sit: Min assist Sit to supine: Mod assist General bed mobility comments: pt able to bring LEs off EOB, minA for trunk elevation, tactile cues to bring L UE across to R hand rail to push up Transfers Overall transfer level: Needs assistance Equipment used: Rolling walker (2  wheels) Transfers: Sit to/from Stand Sit to Stand: Mod assist General transfer comment: initially modA to power up from bed height, once bed elevated pt minA to power up, increased time Ambulation/Gait Ambulation/Gait assistance: Min assist Gait Distance (Feet): 80 Feet Assistive device: Rolling walker (2 wheels) Gait Pattern/deviations: Decreased step length - right, Step-through pattern, Decreased dorsiflexion - right, Trunk flexed, Decreased stride length General Gait Details: pt with increased trunk flexion, verbal cues to hold head up and look forward, pt with short step length bilaterally however pt scuffing R foot freq due to decreased step height. Constant verbal cues given to increase R foot step height, more difficulty with increased distance as fatigue set int Gait velocity: reduced Gait velocity interpretation: <1.31 ft/sec, indicative of household ambulator    ADL: ADL Overall ADL's : Needs assistance/impaired Eating/Feeding: Set up, Sitting Grooming: Set up, Sitting Upper Body Bathing: Set up, Sitting Lower Body Bathing: Minimal assistance, Sit to/from stand Upper Body Dressing : Set up, Sitting Lower Body Dressing: Moderate assistance, Sit to/from Pharmacist, hospital Details (indicate cue type and reason): pt taking lateral side steps along EOB with minA, deferred further mobility secondary to fatigue Toileting- Clothing Manipulation and Hygiene: Moderate assistance, Sit to/from stand Toileting - Clothing Manipulation Details (indicate cue type and reason): assist due to heavy reliance on BUE support in standing Functional mobility during ADLs: Minimal assistance, +2 for safety/equipment, Rolling walker (2 wheels) General ADL Comments: pt limited by decreased endurance, decreased activity tolerance, instability and LE weakness  Cognition: Cognition Overall Cognitive Status: Within Functional Limits for tasks assessed Arousal/Alertness: Awake/alert Orientation Level:  Oriented X4 Attention: Selective Sustained Attention: Appears intact Selective Attention: Appears intact Memory: Appears intact Awareness: Appears intact Cognition Arousal/Alertness: Awake/alert Behavior During Therapy: WFL for tasks assessed/performed Overall Cognitive Status: Within Functional Limits for tasks assessed General Comments: Slow processing, however suspect pt moves slow at baseline  Blood pressure 136/74, pulse 72, temperature 98.9 F (37.2 C), temperature source Oral, resp. rate 13, height 5\' 9"  (1.753 m), weight 103 kg, SpO2 94 %. Physical Exam   General:  No apparent distress HEENT: Head is normocephalic, atraumatic, PERRLA, EOMI, sclera anicteric, oral mucosa pink and moist Neck: Supple without JVD or lymphadenopathy Heart: Reg rate and rhythm. No murmurs rubs or gallops Chest: CTA bilaterally without wheezes, rales, or rhonchi; no distress Abdomen: Soft, non-tender, non-distended, bowel sounds positive. Extremities: No clubbing, cyanosis, or edema. Pulses are  2+ Psych: Pt's affect is flat Skin: Clean and intact without signs of breakdown Neuro: Alert and oriented to person, place, year, month with cues, speech appears to be a little slowed and delayed responses noted.  Occasional word finding difficulties.  Cranial nerves II through XII intact. Able to name and repeat. Recalls 1/3 words 5 min later.  Decreased sensation to light touch below her knees, intact in bilateral upper extremities and face Strength bilateral lower extremities 3/5 hip flexion, distally 4 -/5 Bilateral upper extremities 4+ out of 5 Slight pronator drift on the right Musculoskeletal:  No abnormal muscle tone, normal muscle bulk Mild right shoulder tenderness, pain with abduction passive, Neer's positive   Results for orders placed or performed during the hospital encounter of 09/10/22 (from the past 24 hour(s))  Glucose, capillary     Status: Abnormal   Collection Time: 09/11/22  4:46 PM   Result Value Ref Range   Glucose-Capillary 129 (H) 70 - 99 mg/dL  Glucose, capillary     Status: Abnormal   Collection Time: 09/11/22  9:15 PM  Result Value Ref Range   Glucose-Capillary 170 (H) 70 - 99 mg/dL  Renal function panel     Status: Abnormal   Collection Time: 09/12/22  4:39 AM  Result Value Ref Range   Sodium 136 135 - 145 mmol/L   Potassium 3.5 3.5 - 5.1 mmol/L   Chloride 104 98 - 111 mmol/L   CO2 23 22 - 32 mmol/L   Glucose, Bld 133 (H) 70 - 99 mg/dL   BUN 24 (H) 8 - 23 mg/dL   Creatinine, Ser 1.61 (H) 0.44 - 1.00 mg/dL   Calcium 8.7 (L) 8.9 - 10.3 mg/dL   Phosphorus 3.9 2.5 - 4.6 mg/dL   Albumin 2.8 (L) 3.5 - 5.0 g/dL   GFR, Estimated 38 (L) >60 mL/min   Anion gap 9 5 - 15  Glucose, capillary     Status: Abnormal   Collection Time: 09/12/22  8:23 AM  Result Value Ref Range   Glucose-Capillary 237 (H) 70 - 99 mg/dL  Glucose, capillary     Status: Abnormal   Collection Time: 09/12/22 12:39 PM  Result Value Ref Range   Glucose-Capillary 188 (H) 70 - 99 mg/dL   VAS [place] LOWER EXTREMITY VENOUS (DVT)  Result Date: 09/11/2022  Lower Venous DVT Study Patient Name:  [name]  Date of Exam:   09/11/2022 Medical Rec #: [id number]        Accession #:    [id number] Date of Birth: 09-03-1950        Patient Gender: F Patient Age:   41 years Exam [facility]  [facility] Procedure:      VAS [place] LOWER EXTREMITY VENOUS (DVT) Referring Phys: Lanae Boast --------------------------------------------------------------------------------  Indications: Edema, and stroke.  Limitations: Body habitus and patient unable to position legs. Comparison Study: Negative left LEV done 09/13/17 Performing Technologist: Sherren Kerns RVS  Examination Guidelines: A complete evaluation includes B-mode imaging, spectral Doppler, color Doppler, and power Doppler as needed of all accessible portions of each vessel. Bilateral testing is considered an integral part of a complete examination. Limited  examinations for reoccurring indications may be performed as noted. The reflux portion of the exam is performed with the patient in reverse Trendelenburg.  +---------+---------------+---------+-----------+----------+-------------------+ RIGHT    CompressibilityPhasicitySpontaneityPropertiesThrombus Aging      +---------+---------------+---------+-----------+----------+-------------------+ CFV      Full           Yes  Yes                                      +---------+---------------+---------+-----------+----------+-------------------+ SFJ      Full                                                             +---------+---------------+---------+-----------+----------+-------------------+ FV Prox  Full                                                             +---------+---------------+---------+-----------+----------+-------------------+ FV Mid   Full           Yes      Yes                                      +---------+---------------+---------+-----------+----------+-------------------+ FV DistalFull                                                             +---------+---------------+---------+-----------+----------+-------------------+ PFV      Full                                                             +---------+---------------+---------+-----------+----------+-------------------+ POP                     Yes      Yes                  patent by color and                                                       Doppler             +---------+---------------+---------+-----------+----------+-------------------+ PTV                                                   Not well visualized +---------+---------------+---------+-----------+----------+-------------------+ PERO                                                  Not well visualized +---------+---------------+---------+-----------+----------+-------------------+    +---------+---------------+---------+-----------+----------+-------------------+ LEFT     CompressibilityPhasicitySpontaneityPropertiesThrombus Aging      +---------+---------------+---------+-----------+----------+-------------------+  CFV      Full           Yes      Yes                                      +---------+---------------+---------+-----------+----------+-------------------+ SFJ      Full                                                             +---------+---------------+---------+-----------+----------+-------------------+ FV Prox  Full                                                             +---------+---------------+---------+-----------+----------+-------------------+ FV Mid   Full                                                             +---------+---------------+---------+-----------+----------+-------------------+ FV DistalFull                                                             +---------+---------------+---------+-----------+----------+-------------------+ PFV      Full                                                             +---------+---------------+---------+-----------+----------+-------------------+ POP                                                   patent by color and                                                       Doppler             +---------+---------------+---------+-----------+----------+-------------------+ PTV                                                   Not well visualized +---------+---------------+---------+-----------+----------+-------------------+ PERO  Not well visualized +---------+---------------+---------+-----------+----------+-------------------+     Summary: BILATERAL: - No evidence of deep vein thrombosis seen in the lower extremities, bilaterally. -No evidence of popliteal cyst, bilaterally.   *See table(s)  above for measurements and observations. Electronically signed by Waverly Ferrari MD on 09/11/2022 at 8:39:28 PM.    Final    CT ANGIO HEAD NECK W WO CM  Result Date: 09/11/2022 CLINICAL DATA:  Stroke, determine embolic source. EXAM: CT ANGIOGRAPHY HEAD AND NECK WITH AND WITHOUT CONTRAST TECHNIQUE: Multidetector CT imaging of the head and neck was performed using the standard protocol during bolus administration of intravenous contrast. Multiplanar CT image reconstructions and MIPs were obtained to evaluate the vascular anatomy. Carotid stenosis measurements (when applicable) are obtained utilizing NASCET criteria, using the distal internal carotid diameter as the denominator. RADIATION DOSE REDUCTION: This exam was performed according to the departmental dose-optimization program which includes automated exposure control, adjustment of the mA and/or kV according to patient size and/or use of iterative reconstruction technique. CONTRAST:  75mL OMNIPAQUE IOHEXOL 350 MG/ML SOLN COMPARISON:  Brain MRI from yesterday FINDINGS: CT HEAD FINDINGS Brain: Small acute infarcts are largely occult compared to brain MRI. Multiple chronic infarcts including along the bilateral frontal parietal cortex (more extensive on the left) and at the left corona radiata. Small chronic cerebellar infarcts. No acute hemorrhage, hydrocephalus, or masslike finding. Vascular: No hyperdense vessel or unexpected calcification. Skull: Normal. Negative for fracture or focal lesion. Sinuses/Orbits: No acute finding Review of the MIP images confirms the above findings CTA NECK FINDINGS Aortic arch: Atheromatous plaque with 3 vessel branching. Right carotid system: Atheromatous calcification at the carotid bifurcation. No flow reducing stenosis or ulceration. Left carotid system: Moderate mixed density plaque primarily at the bifurcation. No stenosis or ulceration. Vertebral arteries: No proximal subclavian stenosis although there is  atherosclerosis on the right. The vertebral arteries are smoothly contoured and widely patent Skeleton: C5-6 ACDF with solid arthrodesis. Other neck: No acute finding Upper chest: No acute finding Review of the MIP images confirms the above findings CTA HEAD FINDINGS Anterior circulation: No branch occlusion, beading, or aneurysm. Atheromatous calcification along the carotid siphons. Hypoplastic right A1 segment Posterior circulation: The vertebral and basilar arteries are smoothly contoured and widely patent. No branch occlusion, beading, or aneurysm. Venous sinuses: Diffusely patent Anatomic variants: As above Review of the MIP images confirms the above findings IMPRESSION: No emergent vascular finding. There is atherosclerosis without flow reducing stenosis or ulceration of major arteries in the head and neck. Electronically Signed   By: Tiburcio Pea M.D.   On: 09/11/2022 10:22   MR ANGIO HEAD WO CONTRAST  Result Date: 09/10/2022 CLINICAL DATA:  Stroke/TIA, determine embolic source EXAM: MRA HEAD WITHOUT CONTRAST TECHNIQUE: Angiographic images of the Circle of Willis were acquired using MRA technique without intravenous contrast. COMPARISON:  MRA head 08/09/22 FINDINGS: Anterior circulation: No significant stenosis. No proximal occlusion. No aneurysm. Posterior circulation: No significant stenosis. No proximal occlusion. No aneurysm. Anatomic variants: No Other: None. IMPRESSION: No intracranial large vessel occlusion or significant stenosis. Consider further evaluation with an MRA of the neck, this is not previously performed Electronically Signed   By: Lorenza Cambridge M.D.   On: 09/10/2022 18:51    Assessment/Plan: Diagnosis: Left basal ganglia and left parietal infarcts, concerning for cardioembolic origin Does the need for close, 24 hr/day medical supervision in concert with the patient's rehab needs make it unreasonable for this patient to be served in a less intensive setting? Yes Co-Morbidities  requiring  supervision/potential complications:  -Hypertension, prior CVA, history of orthostatic hypotension, hyperlipidemia, diabetes mellitus type 2, obesity, obstructive sleep apnea, depression, breast cancer history, hyperlipidemia, CKD, HTN Due to bladder management, bowel management, safety, skin/wound care, disease management, medication administration, pain management, and patient education, does the patient require 24 hr/day rehab nursing? Yes Does the patient require coordinated care of a physician, rehab nurse, therapy disciplines of physical therapy and Occupational Therapy to address physical and functional deficits in the context of the above medical diagnosis(es)? Yes Addressing deficits in the following areas: balance, endurance, locomotion, strength, transferring, bowel/bladder control, bathing, dressing, feeding, grooming, toileting, and psychosocial support Can the patient actively participate in an intensive therapy program of at least 3 hrs of therapy per day at least 5 days per week? Yes The potential for patient to make measurable gains while on inpatient rehab is excellent Anticipated functional outcomes upon discharge from inpatient rehab are modified independent  with PT, modified independent with OT, n/a with SLP. Estimated rehab length of stay to reach the above functional goals is: 10-12 Anticipated discharge destination: Home Overall Rehab/Functional Prognosis: excellent  POST ACUTE RECOMMENDATIONS: This patient's condition is appropriate for continued rehabilitative care in the following setting: CIR Patient has agreed to participate in recommended program. Yes Note that insurance prior authorization may be required for reimbursement for recommended care.  Comment: I think she would be a good candidate for CIR when medically stable.   MEDICAL RECOMMENDATIONS: Consider additional medication for constipation such as sorbitol Consider Voltaren gel to assist with  right shoulder pain   I have personally performed a face to face diagnostic evaluation of this patient. Additionally, I have examined the patient's medical record including any pertinent labs and radiographic images. If the physician assistant has documented in this note, I have reviewed and edited or otherwise concur with the physician assistant's documentation.  Thanks,  Fanny Dance, MD 09/12/2022

## 2022-09-12 NOTE — Progress Notes (Signed)
STROKE TEAM PROGRESS NOTE   INTERVAL HISTORY No family at bedside. Pt lying in bed, neuro stable. No acute change overnight. Pending CIR placement. Plan for loop prior to CIR admission    Vitals:   09/11/22 2347 09/12/22 0400 09/12/22 0819 09/12/22 1239  BP: (!) 158/62 (!) 167/67 (!) 169/69 136/74  Pulse: 67 72 71 72  Resp: 12 16 14 13   Temp: 98.1 F (36.7 C)  97.8 F (36.6 C) 98.9 F (37.2 C)  TempSrc:   Oral Oral  SpO2: 91% 90% 94%   Weight:      Height:       CBC:  Recent Labs  Lab 09/10/22 0527 09/11/22 0500  WBC 8.0 5.5  NEUTROABS 4.9  --   HGB 10.2* 10.3*  HCT 31.1* 31.0*  MCV 98.4 96.6  PLT 272 224   Basic Metabolic Panel:  Recent Labs  Lab 09/11/22 0500 09/12/22 0439  NA 138 136  K 3.5 3.5  CL 106 104  CO2 24 23  GLUCOSE 117* 133*  BUN 23 24*  CREATININE 1.43* 1.47*  CALCIUM 8.8* 8.7*  PHOS  --  3.9   Urine Drug Screen:  Recent Labs  Lab 09/10/22 0659  LABOPIA NONE DETECTED  COCAINSCRNUR NONE DETECTED  LABBENZ NONE DETECTED  AMPHETMU NONE DETECTED  THCU NONE DETECTED  LABBARB NONE DETECTED    Alcohol Level  Recent Labs  Lab 09/10/22 0527  ETH <10    IMAGING past 24 hours VAS [place] LOWER EXTREMITY VENOUS (DVT)  Result Date: 09/11/2022  Lower Venous DVT Study Patient Name:  [name]  Date of Exam:   09/11/2022 Medical Rec #: [id number]        Accession #:    [id number] Date of Birth: 02-18-1951        Patient Gender: F Patient Age:   18 years Exam Location:  [facility] Procedure:      VAS [place] LOWER EXTREMITY VENOUS (DVT) Referring Phys: Lanae Boast --------------------------------------------------------------------------------  Indications: Edema, and stroke.  Limitations: Body habitus and patient unable to position legs. Comparison Study: Negative left LEV done 09/13/17 Performing Technologist: Sherren Kerns RVS  Examination Guidelines: A complete evaluation includes B-mode imaging, spectral Doppler, color Doppler, and power  Doppler as needed of all accessible portions of each vessel. Bilateral testing is considered an integral part of a complete examination. Limited examinations for reoccurring indications may be performed as noted. The reflux portion of the exam is performed with the patient in reverse Trendelenburg.  +---------+---------------+---------+-----------+----------+-------------------+ RIGHT    CompressibilityPhasicitySpontaneityPropertiesThrombus Aging      +---------+---------------+---------+-----------+----------+-------------------+ CFV      Full           Yes      Yes                                      +---------+---------------+---------+-----------+----------+-------------------+ SFJ      Full                                                             +---------+---------------+---------+-----------+----------+-------------------+ FV Prox  Full                                                             +---------+---------------+---------+-----------+----------+-------------------+  FV Mid   Full           Yes      Yes                                      +---------+---------------+---------+-----------+----------+-------------------+ FV DistalFull                                                             +---------+---------------+---------+-----------+----------+-------------------+ PFV      Full                                                             +---------+---------------+---------+-----------+----------+-------------------+ POP                     Yes      Yes                  patent by color and                                                       Doppler             +---------+---------------+---------+-----------+----------+-------------------+ PTV                                                   Not well visualized +---------+---------------+---------+-----------+----------+-------------------+ PERO                                                   Not well visualized +---------+---------------+---------+-----------+----------+-------------------+   +---------+---------------+---------+-----------+----------+-------------------+ LEFT     CompressibilityPhasicitySpontaneityPropertiesThrombus Aging      +---------+---------------+---------+-----------+----------+-------------------+ CFV      Full           Yes      Yes                                      +---------+---------------+---------+-----------+----------+-------------------+ SFJ      Full                                                             +---------+---------------+---------+-----------+----------+-------------------+ FV Prox  Full                                                             +---------+---------------+---------+-----------+----------+-------------------+  FV Mid   Full                                                             +---------+---------------+---------+-----------+----------+-------------------+ FV DistalFull                                                             +---------+---------------+---------+-----------+----------+-------------------+ PFV      Full                                                             +---------+---------------+---------+-----------+----------+-------------------+ POP                                                   patent by color and                                                       Doppler             +---------+---------------+---------+-----------+----------+-------------------+ PTV                                                   Not well visualized +---------+---------------+---------+-----------+----------+-------------------+ PERO                                                  Not well visualized +---------+---------------+---------+-----------+----------+-------------------+     Summary: BILATERAL: - No  evidence of deep vein thrombosis seen in the lower extremities, bilaterally. -No evidence of popliteal cyst, bilaterally.   *See table(s) above for measurements and observations. Electronically signed by Waverly Ferrari MD on 09/11/2022 at 8:39:28 PM.    Final     PHYSICAL EXAM  Temp:  [97.8 F (36.6 C)-98.9 F (37.2 C)] 98.9 F (37.2 C) (06/24 1239) Pulse Rate:  [67-72] 72 (06/24 1239) Resp:  [12-17] 13 (06/24 1239) BP: (136-169)/(62-74) 136/74 (06/24 1239) SpO2:  [90 %-97 %] 94 % (06/24 0819)  GENERAL: Awake, alert, in no acute distress   NEURO: Patient lying bed, continues to report whole body weakness.  On exam, patient AAOx3, fluent language, no aphasia, follows all simple commands, able to name repeat.  Visual fields full, no gaze palsy, facial symmetrical.  Tongue midline.  Bilateral upper extremity no drift, bilateral lower extremity proximal 3/5, distal 4+/5.  Sensation decreased bilateral lower extremity due to chronic neuropathy.  Finger-to-nose intact but slow on the right. Gait not tested.    ASSESSMENT/PLAN [name] is a 72 y.o. female with PMHx of breast cancer, CKD stage II, HLD, HTN, OSA, MDD, DM2, obesity, and recent CVA in May 2024 s/p rehab discharge August 25, 2022 who presented to the ED on 09/10/2022 for evaluation of new onset of right-sided weakness. Imaging on ED arrival revealed acute infarcts in the bilateral corona radiate and left parietal and patient was admitted for further stroke management.  Of note, patient completed 3 weeks of DAPT at discharge followed by ASA monotherapy. Patient was discharged with a recommendation for a 30-day event monitor but patient reports she has not yet initiated outpatient cardiac monitoring at this time.   Recurrent stroke - new left BG and left parietal small / punctate infarcts, concerning for Cardio-embolic source.   Code Stroke CT head No acute or reversible finding.Chronic and subacute infarcts without noted  progression from brain MRI 08/08/2022 CTA head & neck No emergent vascular finding. There is atherosclerosis without flow reducing stenosis or ulceration of major arteries in the head and neck. MRI  Acute infarcts in the bilateral corona radiata and in the left parietal lobe, new compared to 08/08/2022. No hemorrhage. MRA  Head No LVO or significant stenosis Carotid Doppler 5/21: right Carotid 1-39%, Left Carotid 40-59% stenosis.  LE venous Doppler no DVT 2D Echo 5/21: EF 60-65%, Mild LVH, Grade I diastolic dysfunction, Negative bubble study.  Loop recorder planned prior to discharge LDL 58 HgbA1c 7.0 VTE prophylaxis - lovenox aspirin 81 mg daily prior to admission, now on aspirin 81 mg daily and clopidogrel 75 mg daily for 3 weeks and then Plavix alone Therapy recommendations:  CIR Disposition:  floor  Recent CVA 07/2022 admitted for weakness and falling at home.  CT showed multiple old infarcts bilaterally.  MRI showed multifocal bilateral acute infarcts, predominantly affecting gray matter. Also 2 cortical foci within the right MCA territory. Old bilateral MCA territory infarcts. Mild volume loss and chronic ischemic microangiopathy. MRA WNL.  Carotid Doppler left 40 to 59% stenosis.  EF 60 to 65%, LDL 58, A1c 7.0.  Discharged on DAPT therapy, which has finished and was on aspirin at home.  Recommend 30-day Cardiac event monitoring as outpatient.  Hypertension History of orthostatic hypotension Home meds:  amlodipine 10mg , coreg 6.25mg , losartan 25mg --on hold Stable on the high side Gradually normalize BP in 2 to 3 days Orthostatic vitals 163/68 supine, 155/67 sitting, 128/107 standing, 129/62 standing ~3 min  Long-term BP goal normotensive.  Avoid hypotension /dehydration  Hyperlipidemia Home meds:  lipitor 80mg , resumed in hospital LDL 58, goal < 70 Continue statin at discharge  Diabetes type II controlled Home meds:  metformin 1000mg  HgbA1c 7.0, goal < 7.0 CBGs SSI Close PCP  follow-up for better DM control  Other Stroke Risk Factors Advanced Age >/= 17  Obesity, Body mass index is 33.52 kg/m., BMI >/= 30 associated with increased stroke risk, recommend weight loss, diet and exercise as appropriate  Obstructive sleep apnea, not on CPAP at home  Other Active Problems Depression Lexapro continued Breast cancer on tamoxifen  Hospital day # 1  Neurology will sign off. Please call with questions. Pt will follow up with Dr. Karel Jarvis at [facility] in about 4 weeks. Thanks for the consult.  Marvel Plan, MD PhD Stroke Neurology 09/12/2022 2:59 PM    To contact Stroke Continuity provider, please refer to [email or web address]. After hours, contact General Neurology

## 2022-09-13 DIAGNOSIS — I639 Cerebral infarction, unspecified: Secondary | ICD-10-CM | POA: Diagnosis not present

## 2022-09-13 LAB — URINE CULTURE: Culture: >=100000 — AB

## 2022-09-13 LAB — GLUCOSE, CAPILLARY
Glucose-Capillary: 151 mg/dL — ABNORMAL HIGH (ref 70–99)
Glucose-Capillary: 201 mg/dL — ABNORMAL HIGH (ref 70–99)
Glucose-Capillary: 345 mg/dL — ABNORMAL HIGH (ref 70–99)
Glucose-Capillary: 160 mg/dL — ABNORMAL HIGH (ref 70–99)

## 2022-09-13 MED ORDER — AMLODIPINE BESYLATE 5 MG PO TABS
5.0000 mg | ORAL_TABLET | Freq: Every day | ORAL | Status: DC
  Administered 2022-09-13 – 2022-09-14 (×2): 5 mg via ORAL
  Filled 2022-09-13 (×2): qty 1

## 2022-09-13 MED ORDER — DICLOFENAC SODIUM 1 % EX GEL
4.0000 g | Freq: Four times a day (QID) | CUTANEOUS | Status: DC
  Administered 2022-09-13 – 2022-09-14 (×4): 4 g via TOPICAL
  Filled 2022-09-13: qty 100

## 2022-09-13 MED ORDER — CARVEDILOL 3.125 MG PO TABS
3.1250 mg | ORAL_TABLET | Freq: Two times a day (BID) | ORAL | Status: DC
  Administered 2022-09-13 – 2022-09-14 (×3): 3.125 mg via ORAL
  Filled 2022-09-13 (×3): qty 1

## 2022-09-13 NOTE — Telephone Encounter (Signed)
Called patient's sister to update per physician that one of our doctor's saw her one day ago at hospital and orders should have been placed there.

## 2022-09-13 NOTE — PMR Pre-admission (Signed)
PMR Admission Coordinator Pre-Admission Assessment  Patient: [name] is an 72 y.o., female MRN: [id number] DOB: July 15, 1950 Height: 5\' 9"  (175.3 cm) Weight: 103 kg             Insurance Information HMO:     PPO: yes     PCP:      IPA:      80/20:      OTHER:  PRIMARY: [employer]      Policy#: [id number]      Subscriber: pt CM Name: Arlyss Queen     Phone#: [phone]     Fax#: [phone]/EPIC access Pre-Cert#: [id number]      Employer:  Benefits:  Phone #: [phone]     Name: 6/24 Eff. Date: 03/21/22     Deduct: none      Out of Pocket Max: $3200      Life Max: none CIR: $295 co pay per day days 1 until 6      SNF: no  copay per day days 1 until 20; $203 co pay per day days 21 until 100 Outpatient: $15 per visit     Co-Pay: visit per medical neccesity Home Health: 100%      Co-Pay: vitis per medical neccesity DME: 80%     Co-Pay: 20% Providers: in network  SECONDARY: none   Financial Counselor:       Phone#:   The Data processing manager" for patients in Inpatient Rehabilitation Facilities with attached "Privacy Act Statement-Health Care Records" was provided and verbally reviewed with: Patient  Emergency Contact Information Contact Information     Name Relation Home Work Mobile   [place] Sister   [phone]      Current Medical History  Patient Admitting Diagnosis: CVA  History of Present Illness: 72 year old right-handed female with a history of 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  Presented on 09/10/22 with increased weakness noted the day prior. When she tried to get out of bed, she slid to the ground and EMS was contacted. She was to have outpatient cardiac monitoring at  discharge but had not yet reviewed. Imaging revealed new left BG and Left parietal small/punctate infarcts, concerning for cardio embolic source. Continue on ASA and Plavix for 3 weeks followed by ASA alone. Plan for LOOP placement before discharge. Home meds of amlodipine, Coreg and losartan placed on hold. To allow for permissive HTN and to avoid hypotension and dehydration. Home Lipitor resumed. Home meds of metformin to monitor CBGS and SSI. Lexapro continued and breast cancer on Tamoxifen.  Complete NIHSS TOTAL: 3 Glasgow Coma Scale Score: 15  Patient's medical record from [facility] has been reviewed by the rehabilitation admission coordinator and physician.  Past Medical History  Past Medical History:  Diagnosis Date   AKI (acute kidney injury) (HCC) 08/09/2022   Breast cancer (HCC) 12/2019   left breast DCIS   CKD (chronic kidney disease) 06/07/2016   Stage 2, GFR 60-89 ml/min   Diabetic polyneuropathy 06/07/2016   Dyslipidemia    Endometrial adenocarcinoma 2012   Fever blister    Hyperlipidemia    Hypertension, essential 06/07/2016   Major depressive disorder    Mixed dyslipidemia 06/07/2016   MRSA infection 10/2019   left foot wound   Obstructive sleep apnea 06/07/2016  does not use CPAP   Peripheral neuropathy    Retinopathy    Right rib fracture 08/09/2022   Severe obesity    Skin ulcer of left foot with fat layer exposed 11/02/2016   Stroke    Asymptomatic, discovered via neuroimaging; small infarct in left parietal lobe; also concern for small b/l frontal infarcts   Tachycardia 07/19/2017   Traumatic rhabdomyolysis (HCC) 08/09/2022   Type 2 diabetes mellitus with hyperglycemia, with long-term current use of insulin 06/07/2016   Unilateral primary osteoarthritis, right knee 04/16/2019   UTI (urinary tract infection) 08/09/2022   Has the patient had major surgery during 100 days prior to admission? No  Family History  family history includes  Alzheimer's disease in her father and mother; Bladder Cancer in her father; Breast cancer (age of onset: 79) in her paternal aunt; CAD in her father; Diverticulitis in her sister; Heart attack in her father; Hypertension in her mother and sister; Obesity in her sister; Prostate cancer in her paternal uncle; Voice disorder in her brother.  Current Medications   Current Facility-Administered Medications:    acetaminophen (TYLENOL) tablet 650 mg, 650 mg, Oral, Q4H PRN **OR** acetaminophen (TYLENOL) 160 MG/5ML solution 650 mg, 650 mg, Per Tube, Q4H PRN **OR** acetaminophen (TYLENOL) suppository 650 mg, 650 mg, Rectal, Q4H PRN, [name], MD   aspirin EC tablet 81 mg, 81 mg, Oral, Daily, Pham, Minh Q, RPH-CPP, 81 mg at 09/13/22 0820   atorvastatin (LIPITOR) tablet 80 mg, 80 mg, Oral, Daily, [name], MD, 80 mg at 09/13/22 0820   buPROPion (WELLBUTRIN XL) 24 hr tablet 300 mg, 300 mg, Oral, Daily, [name], MD, 300 mg at 09/13/22 0820   clopidogrel (PLAVIX) tablet 75 mg, 75 mg, Oral, Daily, [name], MD, 75 mg at 09/13/22 0820   enoxaparin (LOVENOX) injection 40 mg, 40 mg, Subcutaneous, Q24H, [name], MD, 40 mg at 09/12/22 2016   escitalopram (LEXAPRO) tablet 20 mg, 20 mg, Oral, Daily, [name], MD, 20 mg at 09/13/22 0820   insulin aspart (novoLOG) injection 0-9 Units, 0-9 Units, Subcutaneous, TID WC, [name], MD, 1 Units at 09/13/22 9562   insulin aspart protamine- aspart (NOVOLOG MIX 70/30) injection 20 Units, 20 Units, Subcutaneous, BID WC, [name], MD, 20 Units at 09/13/22 1308   melatonin tablet 3 mg, 3 mg, Oral, QHS PRN, [name], MD   Oral care mouth rinse, 15 mL, Mouth Rinse, PRN, Ghimire, Werner Lean, MD   tamoxifen (NOLVADEX) tablet 20 mg, 20 mg, Oral, Daily, [name], MD, 20 mg at 09/13/22 0820  Patients Current Diet:  Diet Order             Diet heart healthy/carb modified Room  service appropriate? Yes; Fluid consistency: Thin  Diet effective now                  Precautions / Restrictions Precautions Precautions: Fall Restrictions Weight Bearing Restrictions: No   Has the patient had 2 or more falls or a fall with injury in the past year?Yes  Prior Activity Level Limited Community (1-2x/wk): Mod I to supervision level with RW since d/c'd from CIR 08/25/22  Prior Functional Level Prior Function Prior Level of Function : Independent/Modified Independent, Driving Mobility Comments: Mod I with rollator ADLs Comments: tub bench for shower, pt was independent with all ADL except donning ted hose which family would assist with. family also assisted with IADL  Self Care: Did  the patient need help bathing, dressing, using the toilet or eating?  Independent  Indoor Mobility: Did the patient need assistance with walking from room to room (with or without device)? Independent  Stairs: Did the patient need assistance with internal or external stairs (with or without device)? Independent  Functional Cognition: Did the patient need help planning regular tasks such as shopping or remembering to take medications? Needed some help  Patient Information Are you of Hispanic, Latino/a,or Spanish origin?: A. No, not of Hispanic, Latino/a, or Spanish origin What is your race?: A. White Do you need or want an interpreter to communicate with a doctor or health care staff?: 0. No  Patient's Response To:  Health Literacy and Transportation Is the patient able to respond to health literacy and transportation needs?: Yes Health Literacy - How often do you need to have someone help you when you read instructions, pamphlets, or other written material from your doctor or pharmacy?: Never In the past 12 months, has lack of transportation kept you from medical appointments or from getting medications?: No In the past 12 months, has lack of transportation kept you from meetings,  work, or from getting things needed for daily living?: No  Home Assistive Devices / Equipment Home Equipment: Rollator (4 wheels), Tub bench, BSC/3in1  Prior Device Use: Indicate devices/aids used by the patient prior to current illness, exacerbation or injury? Walker  Current Functional Level Cognition  Arousal/Alertness: Awake/alert Overall Cognitive Status: Within Functional Limits for tasks assessed Orientation Level: Oriented X4 General Comments: Slow processing, however suspect pt moves slow at baseline Attention: Selective Sustained Attention: Appears intact Selective Attention: Appears intact Memory: Appears intact Awareness: Appears intact    Extremity Assessment (includes Sensation/Coordination)  Upper Extremity Assessment: Generalized weakness RUE Deficits / Details: grossly 4/5 vs LUE 5/5; decreased fine motor coordination, decreased rapid alternating movement, pt reports sensationis intact RUE Sensation: WNL RUE Coordination: decreased fine motor  Lower Extremity Assessment: RLE deficits/detail RLE Deficits / Details: MMT scores of 3 hip flexion, 4- knee extension, 3+ ankle dorsiflexion; hx of peripheral neuropathy resulting in no sensation below knees bil but sensation at thighs intact bil; Gallop incoordination RLE Sensation: history of peripheral neuropathy RLE Coordination: decreased Doan motor    ADLs  Overall ADL's : Needs assistance/impaired Eating/Feeding: Set up, Sitting Grooming: Set up, Sitting, Standing (patient alternating between sitting and standing at sink  PAtient standing fro approx 1 min before seated rest break) Upper Body Bathing: Set up, Sitting Lower Body Bathing: Minimal assistance, Sit to/from stand Upper Body Dressing : Set up, Sitting Lower Body Dressing: Moderate assistance, Sit to/from Pharmacist, hospital Details (indicate cue type and reason): pt taking lateral side steps along EOB with minA, deferred further mobility secondary to  fatigue Toileting- Clothing Manipulation and Hygiene: Moderate assistance, Sit to/from stand Toileting - Clothing Manipulation Details (indicate cue type and reason): assist due to heavy reliance on BUE support in standing Functional mobility during ADLs: Minimal assistance, Rolling walker (2 wheels), Cueing for safety General ADL Comments: pt limited by decreased endurance, decreased activity tolerance, instability and LE weakness    Mobility  Overal bed mobility: Needs Assistance Bed Mobility: Supine to Sit Supine to sit: Min assist Sit to supine: Mod assist General bed mobility comments:  (assist to lift LEs into bed)    Transfers  Overall transfer level: Needs assistance Equipment used: Rolling walker (2 wheels) Transfers: Sit to/from Stand Sit to Stand: Mod assist General transfer comment: initially modA to power up from  bed height, once bed elevated pt minA to power up, increased time    Ambulation / Gait / Stairs / Wheelchair Mobility  Ambulation/Gait Ambulation/Gait assistance: Editor, commissioning (Feet): 80 Feet Assistive device: Rolling walker (2 wheels) Gait Pattern/deviations: Decreased step length - right, Step-through pattern, Decreased dorsiflexion - right, Trunk flexed, Decreased stride length General Gait Details: pt with increased trunk flexion, verbal cues to hold head up and look forward, pt with short step length bilaterally however pt scuffing R foot freq due to decreased step height. Constant verbal cues given to increase R foot step height, more difficulty with increased distance as fatigue set int Gait velocity: reduced Gait velocity interpretation: <1.31 ft/sec, indicative of household ambulator    Posture / Balance Dynamic Sitting Balance Sitting balance - Comments: pt able to maintain EOB balance this date without use of UEs while feet were supported on the floor Balance Overall balance assessment: Needs assistance Sitting-balance support: Single  extremity supported, Feet supported Sitting balance-Leahy Scale: Fair Sitting balance - Comments: pt able to maintain EOB balance this date without use of UEs while feet were supported on the floor Postural control: Posterior lean Standing balance support: Bilateral upper extremity supported, During functional activity Standing balance-Leahy Scale: Poor Standing balance comment: reliant on RW for safe ambulation    Special needs/care consideration Loop Placement before d/c to CIR Podiatry consulted 6/24 for wound follow up see consult; feet bilaterally. Xeroform with Meplix bandages to all ulcers bilaterally and change daily      Previous Home Environment  Living Arrangements:  (lives with sister and brother in Social worker)  Lives With: Family Available Help at Discharge: Family, Available 24 hours/day Type of Home: House Home Layout: One level Home Access: Stairs to enter Entrance Stairs-Rails: Right, Left Entrance Stairs-Number of Steps: 5 Bathroom Shower/Tub: Engineer, manufacturing systems:  (bedside commode over top of it during the day) Bathroom Accessibility: Yes How Accessible: Accessible via walker Home Care Services: Other (Comment) Additional Comments: [facility] PT and OT  Discharge Living Setting Plans for Discharge Living Setting: Lives with (comment) (sister and brother in law) Type of Home at Discharge: House Discharge Home Layout: One level Discharge Home Access: Stairs to enter Entrance Stairs-Rails: Right, Left Entrance Stairs-Number of Steps: 5 Discharge Bathroom Shower/Tub: Tub/shower unit Discharge Bathroom Toilet: Standard Discharge Bathroom Accessibility: Yes How Accessible: Accessible via walker Does the patient have any problems obtaining your medications?: No  Social/Family/Support Systems Contact Information: sister , [name] Anticipated Caregiver: sister Anticipated Industrial/product designer Information: see contacts Ability/Limitations of Caregiver: no  limitations Caregiver Availability: 24/7 Discharge Plan Discussed with Primary Caregiver: Yes Is Caregiver In Agreement with Plan?: Yes Does Caregiver/Family have Issues with Lodging/Transportation while Pt is in Rehab?: No  Goals Patient/Family Goal for Rehab: superivsion with PT and OT Expected length of stay: ELOS 7 to 10 days Pt/Family Agrees to Admission and willing to participate: Yes Program Orientation Provided & Reviewed with Pt/Caregiver Including Roles  & Responsibilities: Yes  Decrease burden of Care through IP rehab admission: n/a  Possible need for SNF placement upon discharge:not anticipated  Patient Condition: This patient's condition remains as documented in the consult dated 09/12/22, in which the Rehabilitation Physician determined and documented that the patient's condition is appropriate for intensive rehabilitative care in an inpatient rehabilitation facility. Will admit to inpatient rehab today.  Preadmission Screen Completed By:  Clois Dupes, RN MSN 09/13/2022 8:25 AM ______________________________________________________________________   Discussed status with Dr. Marland Kitchenon***at *** and received approval  for admission today.  Admission Coordinator:  Clois Dupes, RN MSN time***/Date***

## 2022-09-13 NOTE — Progress Notes (Signed)
Inpatient Rehabilitation Admissions Coordinator   I await insurance determination for a possible CIR admit.  Ottie Glazier, RN, MSN Rehab Admissions Coordinator [phone] 09/13/2022 1:03 PM

## 2022-09-13 NOTE — Progress Notes (Signed)
PROGRESS NOTE        PATIENT DETAILS Name: [name] Age: 72 y.o. Sex: female Date of Birth: January 12, 1951 Admit Date: 09/10/2022 Admitting Physician [name], MD [id number], Aram Beecham, MD  Brief Summary: Patient is a 72 y.o.  female with history of recent CVA, HTN, HLD, CKD stage IIIb-presenting with right-sided weakness-found to have acute CVA.  Significant events: 6/22>> admit to [facility] for right-sided weakness. 5/24-6/6>> hospitalization at CIR 5/20-5/24>> hospitalization for CVA-discharge to CIR  Significant studies: 5/21>> LDL:58 5/21>> A1c: 7.0 5/21>> echo: EF 60-65% 5/21>> carotid Doppler: Left ICA stenosis-40-59% 6/22>> MRI brain: Acute infarct bilateral corona radiator/left parietal lobe. 6/22>> MRI brain: No LVO/significant stenosis 6/23>> CTA head/neck: No emergent vascular finding-no rate limiting stenosis.  Significant microbiology data: None  Procedures: None  Consults: Neurology  Subjective: No major issues overnight-awaiting insurance authorization for CIR bed.  Objective: Vitals: Blood pressure (!) 184/67, pulse 67, temperature 98.1 F (36.7 C), temperature source Oral, resp. rate 12, height 5\' 9"  (1.753 m), weight 103 kg, SpO2 95 %.   Exam: Gen Exam:Alert awake-not in any distress HEENT:atraumatic, normocephalic Chest: B/L clear to auscultation anteriorly CVS:S1S2 regular Abdomen:soft non tender, non distended Extremities:no edema Neurology: Slight right-sided weakness Skin: no rash  Pertinent Labs/Radiology:    Latest Ref Rng & Units 09/11/2022    5:00 AM 09/10/2022    5:27 AM 08/22/2022    6:59 AM  CBC  WBC 4.0 - 10.5 K/uL 5.5  8.0  6.7   Hemoglobin 12.0 - 15.0 g/dL 62.1  30.8  8.8   Hematocrit 36.0 - 46.0 % 31.0  31.1  27.1   Platelets 150 - 400 K/uL 224  272  264     Lab Results  Component Value Date   NA 136 09/12/2022   K 3.5 09/12/2022   CL 104 09/12/2022   CO2 23 09/12/2022       Assessment/Plan: Acute CVA-recurrent Slight right-sided weakness-drags leg when walking Workup as above Aspirin/Plavix/statin CIR recommended-Loop recorder being planned right before discharge Neurology/cardiology following.    HTN BP on the higher side-allow some amount of permissive hypertension Resume antihypertensives-at half of home dose.   HLD Statin  DM-2 CBG stable Insulin 70/30 20 units twice daily/SSI  Recent Labs    09/12/22 1718 09/12/22 2128 09/13/22 0804  GLUCAP 131* 141* 151*     DCIS-s/p lumpectomy/XRT Tamoxifen Resume outpatient follow-up with oncology  Depression Stable Wellbutrin/Lexapro  Asymptomatic bacteriuria No symptoms-monitor off antibiotics  CKD 3a At baseline  Obesity: Estimated body mass index is 33.52 kg/m as calculated from the following:   Height as of this encounter: 5\' 9"  (1.753 m).   Weight as of this encounter: 103 kg.   Code status:   Code Status: Full Code   DVT Prophylaxis: enoxaparin (LOVENOX) injection 40 mg Start: 09/10/22 2000   Family Communication: None at bedside   Disposition Plan: Status is: Observation The patient will require care spanning > 2 midnights and should be moved to inpatient because: Severity of illness   Planned Discharge Destination: CIR   Diet: Diet Order             Diet heart healthy/carb modified Room service appropriate? Yes; Fluid consistency: Thin  Diet effective now                     Antimicrobial  agents: Anti-infectives (From admission, onward)    None        MEDICATIONS: Scheduled Meds:  aspirin EC  81 mg Oral Daily   atorvastatin  80 mg Oral Daily   buPROPion  300 mg Oral Daily   clopidogrel  75 mg Oral Daily   enoxaparin (LOVENOX) injection  40 mg Subcutaneous Q24H   escitalopram  20 mg Oral Daily   insulin aspart  0-9 Units Subcutaneous TID WC   insulin aspart protamine- aspart  20 Units Subcutaneous BID WC   tamoxifen  20 mg Oral Daily    Continuous Infusions:   PRN Meds:.acetaminophen **OR** acetaminophen (TYLENOL) oral liquid 160 mg/5 mL **OR** acetaminophen, melatonin, mouth rinse   I have personally reviewed following labs and imaging studies  LABORATORY DATA: CBC: Recent Labs  Lab 09/10/22 0527 09/11/22 0500  WBC 8.0 5.5  NEUTROABS 4.9  --   HGB 10.2* 10.3*  HCT 31.1* 31.0*  MCV 98.4 96.6  PLT 272 224     Basic Metabolic Panel: Recent Labs  Lab 09/10/22 0527 09/11/22 0500 09/12/22 0439  NA 133* 138 136  K 4.3 3.5 3.5  CL 101 106 104  CO2 22 24 23   GLUCOSE 155* 117* 133*  BUN 32* 23 24*  CREATININE 1.82* 1.43* 1.47*  CALCIUM 8.9 8.8* 8.7*  PHOS  --   --  3.9     GFR: Estimated Creatinine Clearance: 44.8 mL/min (A) (by C-G formula based on SCr of 1.47 mg/dL (H)).  Liver Function Tests: Recent Labs  Lab 09/10/22 0527 09/12/22 0439  AST 18  --   ALT 18  --   ALKPHOS 42  --   BILITOT 0.7  --   PROT 6.9  --   ALBUMIN 3.1* 2.8*    No results for input(s): "LIPASE", "AMYLASE" in the last 168 hours. No results for input(s): "AMMONIA" in the last 168 hours.  Coagulation Profile: Recent Labs  Lab 09/10/22 0527  INR 1.1     Cardiac Enzymes: No results for input(s): "CKTOTAL", "CKMB", "CKMBINDEX", "TROPONINI" in the last 168 hours.  BNP (last 3 results) No results for input(s): "PROBNP" in the last 8760 hours.  Lipid Profile: No results for input(s): "CHOL", "HDL", "LDLCALC", "TRIG", "CHOLHDL", "LDLDIRECT" in the last 72 hours.  Thyroid Function Tests: No results for input(s): "TSH", "T4TOTAL", "FREET4", "T3FREE", "THYROIDAB" in the last 72 hours.  Anemia Panel: No results for input(s): "VITAMINB12", "FOLATE", "FERRITIN", "TIBC", "IRON", "RETICCTPCT" in the last 72 hours.  Urine analysis:    Component Value Date/Time   COLORURINE YELLOW 09/10/2022 0659   APPEARANCEUR CLEAR 09/10/2022 0659   LABSPEC 1.006 09/10/2022 0659   PHURINE 5.0 09/10/2022 0659   GLUCOSEU  NEGATIVE 09/10/2022 0659   HGBUR NEGATIVE 09/10/2022 0659   BILIRUBINUR NEGATIVE 09/10/2022 0659   KETONESUR NEGATIVE 09/10/2022 0659   PROTEINUR NEGATIVE 09/10/2022 0659   UROBILINOGEN 0.2 03/24/2010 0940   NITRITE POSITIVE (A) 09/10/2022 0659   LEUKOCYTESUR TRACE (A) 09/10/2022 0659    Sepsis Labs: Lactic Acid, Venous No results found for: "LATICACIDVEN"  MICROBIOLOGY: Recent Results (from the past 240 hour(s))  Urine Culture (for pregnant, neutropenic or urologic patients or patients with an indwelling urinary catheter)     Status: Abnormal   Collection Time: 09/10/22  7:04 AM   Specimen: Urine, Clean Catch  Result Value Ref Range Status   Specimen Description URINE, CLEAN CATCH  Final   Special Requests   Final    NONE Performed at [facility]  Lab, [address], [place], [place] [address]    Culture >=100,000 COLONIES/mL ESCHERICHIA COLI (A)  Final   Report Status 09/13/2022 FINAL  Final   Organism ID, Bacteria ESCHERICHIA COLI (A)  Final      Susceptibility   Escherichia coli - MIC*    AMPICILLIN 8 SENSITIVE Sensitive     CEFAZOLIN <=4 SENSITIVE Sensitive     CEFEPIME <=0.12 SENSITIVE Sensitive     CEFTRIAXONE <=0.25 SENSITIVE Sensitive     CIPROFLOXACIN <=0.25 SENSITIVE Sensitive     GENTAMICIN <=1 SENSITIVE Sensitive     IMIPENEM <=0.25 SENSITIVE Sensitive     NITROFURANTOIN <=16 SENSITIVE Sensitive     TRIMETH/SULFA <=20 SENSITIVE Sensitive     AMPICILLIN/SULBACTAM 4 SENSITIVE Sensitive     PIP/TAZO <=4 SENSITIVE Sensitive     * >=100,000 COLONIES/mL ESCHERICHIA COLI    RADIOLOGY STUDIES/RESULTS: VAS [place] LOWER EXTREMITY VENOUS (DVT)  Result Date: 09/11/2022  Lower Venous DVT Study Patient Name:  IONNA AVIS  Date of Exam:   09/11/2022 Medical Rec #: [id number]        Accession #:    [id number] Date of Birth: 1951/02/09        Patient Gender: F Patient Age:   65 years Exam Location:  [facility] Procedure:      VAS [place] LOWER EXTREMITY VENOUS (DVT)  Referring Phys: Lanae Boast --------------------------------------------------------------------------------  Indications: Edema, and stroke.  Limitations: Body habitus and patient unable to position legs. Comparison Study: Negative left LEV done 09/13/17 Performing Technologist: Sherren Kerns RVS  Examination Guidelines: A complete evaluation includes B-mode imaging, spectral Doppler, color Doppler, and power Doppler as needed of all accessible portions of each vessel. Bilateral testing is considered an integral part of a complete examination. Limited examinations for reoccurring indications may be performed as noted. The reflux portion of the exam is performed with the patient in reverse Trendelenburg.  +---------+---------------+---------+-----------+----------+-------------------+ RIGHT    CompressibilityPhasicitySpontaneityPropertiesThrombus Aging      +---------+---------------+---------+-----------+----------+-------------------+ CFV      Full           Yes      Yes                                      +---------+---------------+---------+-----------+----------+-------------------+ SFJ      Full                                                             +---------+---------------+---------+-----------+----------+-------------------+ FV Prox  Full                                                             +---------+---------------+---------+-----------+----------+-------------------+ FV Mid   Full           Yes      Yes                                      +---------+---------------+---------+-----------+----------+-------------------+ FV DistalFull                                                             +---------+---------------+---------+-----------+----------+-------------------+  PFV      Full                                                             +---------+---------------+---------+-----------+----------+-------------------+ POP                      Yes      Yes                  patent by color and                                                       Doppler             +---------+---------------+---------+-----------+----------+-------------------+ PTV                                                   Not well visualized +---------+---------------+---------+-----------+----------+-------------------+ PERO                                                  Not well visualized +---------+---------------+---------+-----------+----------+-------------------+   +---------+---------------+---------+-----------+----------+-------------------+ LEFT     CompressibilityPhasicitySpontaneityPropertiesThrombus Aging      +---------+---------------+---------+-----------+----------+-------------------+ CFV      Full           Yes      Yes                                      +---------+---------------+---------+-----------+----------+-------------------+ SFJ      Full                                                             +---------+---------------+---------+-----------+----------+-------------------+ FV Prox  Full                                                             +---------+---------------+---------+-----------+----------+-------------------+ FV Mid   Full                                                             +---------+---------------+---------+-----------+----------+-------------------+ FV DistalFull                                                             +---------+---------------+---------+-----------+----------+-------------------+  PFV      Full                                                             +---------+---------------+---------+-----------+----------+-------------------+ POP                                                   patent by color and                                                       Doppler              +---------+---------------+---------+-----------+----------+-------------------+ PTV                                                   Not well visualized +---------+---------------+---------+-----------+----------+-------------------+ PERO                                                  Not well visualized +---------+---------------+---------+-----------+----------+-------------------+     Summary: BILATERAL: - No evidence of deep vein thrombosis seen in the lower extremities, bilaterally. -No evidence of popliteal cyst, bilaterally.   *See table(s) above for measurements and observations. Electronically signed by Waverly Ferrari MD on 09/11/2022 at 8:39:28 PM.    Final      LOS: 2 days   Jeoffrey Massed, MD  [facility]    To contact the attending provider between 7A-7P or the covering provider during after hours 7P-7A, please log into the web site [email or web address] and access using universal [place] password for that web site. If you do not have the password, please call the hospital operator.  09/13/2022, 11:02 AM

## 2022-09-13 NOTE — Progress Notes (Signed)
Inpatient Rehabilitation Admissions Coordinator   I have received insurance approval for CIR admit and can plan admit Wednesday after LOOP placed. I contacted patient and sister at her bedside by phone and they are aware. I have alerted acute team and TOC.  Ottie Glazier, RN, MSN Rehab Admissions Coordinator [phone] 09/13/2022 5:53 PM

## 2022-09-13 NOTE — Progress Notes (Signed)
Physical Therapy Treatment Patient Details Name: [name] MRN: [id number] DOB: December 30, 1950 Today's Date: 09/13/2022   History of Present Illness Pt is a 72 y.o. female who presented 09/10/22 with R-sided weakness. MRI showed new acute infarcts in the bilateral corona radiata and in the left parietal lobe. Pt recently admitted 08/08/22  with CVA and R rib fx and discharged to AIR then home. PMH: ductal carcinoma in situ of left breast, DM2, obesity, CKD, HLD, HTN, OSA    PT Comments    Pt progressing well towards all goals. Pt was soiled in the bed and was able to stand with R UE support in modified squat to perform pericare with L UE, pt with 2 episodes of LOB due to onset of fatigue requiring minA to prevent fall. Pt with improved gait kinematics and ambulation tolerance as well. Pt remains appropriate and would greatly benefit from inpatient therapy program > 3 hours a day to achieve supervision level of function. Acute PT to cont to follow.    Recommendations for follow up therapy are one component of a multi-disciplinary discharge planning process, led by the attending physician.  Recommendations may be updated based on patient status, additional functional criteria and insurance authorization.  Follow Up Recommendations       Assistance Recommended at Discharge Frequent or constant Supervision/Assistance  Patient can return home with the following A lot of help with walking and/or transfers;A little help with bathing/dressing/bathroom;Assistance with cooking/housework;Help with stairs or ramp for entrance;Assist for transportation   Equipment Recommendations  None recommended by PT    Recommendations for Other Services Rehab consult     Precautions / Restrictions Precautions Precautions: Fall Restrictions Weight Bearing Restrictions: No     Mobility  Bed Mobility Overal bed mobility: Needs Assistance Bed Mobility: Supine to Sit       Sit to supine: Mod assist    General bed mobility comments: pt able to bring LEs off EOB, minA for trunk elevation, tactile cues to bring L UE across to R hand rail to push up, assist to scoot forward so feet on the floor    Transfers Overall transfer level: Needs assistance Equipment used: Rolling walker (2 wheels) Transfers: Sit to/from Stand Sit to Stand: Mod assist           General transfer comment: modA to power up, easier from higher surface height    Ambulation/Gait Ambulation/Gait assistance: Min assist Gait Distance (Feet): 120 Feet Assistive device: Rolling walker (2 wheels) Gait Pattern/deviations: Decreased step length - right, Step-through pattern, Decreased dorsiflexion - right, Trunk flexed, Decreased stride length Gait velocity: dec Gait velocity interpretation: <1.31 ft/sec, indicative of household ambulator   General Gait Details: pt with improved ability to consistently clear R foot  this date for majority of time, onset of fatigue came s/p 69' requiring more verbal cues to incresaed R LE step height and to make the knee straight when advancing L LE to minimize R knee instability/buckling at onset of fatigue   Stairs             Wheelchair Mobility    Modified Rankin (Stroke Patients Only) Modified Rankin (Stroke Patients Only) Pre-Morbid Rankin Score: Moderate disability Modified Rankin: Moderately severe disability     Balance Overall balance assessment: Needs assistance Sitting-balance support: Single extremity supported, Feet supported Sitting balance-Leahy Scale: Fair Sitting balance - Comments: pt able to maintain EOB balance this date without use of UEs while feet were supported on the floor   Standing balance  support: Single extremity supported, During functional activity Standing balance-Leahy Scale: Fair Standing balance comment: pt stood  at RW and performed pericare in mini squat position and support of R UE on RW while using L UE to cleanse. Pt with noted  onset of fatigue s/p 1 min requiring minA to prevent falling forward, pt stood total of 4 3 minutes.                            Cognition Arousal/Alertness: Awake/alert Behavior During Therapy: Flat affect Overall Cognitive Status: Within Functional Limits for tasks assessed                                 General Comments: slow processing and depressed spirits but able to follow all commands and expressed "I'm proud of all the stuff I did today.", perking up her spirits        Exercises General Exercises - Lower Extremity Long Arc Quad: AROM, Right, 10 reps, Seated Hip Flexion/Marching: AROM, Both, 10 reps, Seated    General Comments General comments (skin integrity, edema, etc.): VSS      Pertinent Vitals/Pain Pain Assessment Pain Assessment: No/denies pain    Home Living                          Prior Function            PT Goals (current goals can now be found in the care plan section) Acute Rehab PT Goals PT Goal Formulation: With patient Time For Goal Achievement: 09/25/22 Potential to Achieve Goals: Good Progress towards PT goals: Progressing toward goals    Frequency    Min 4X/week      PT Plan Current plan remains appropriate    Co-evaluation              AM-PAC PT "6 Clicks" Mobility   Outcome Measure  Help needed turning from your back to your side while in a flat bed without using bedrails?: A Little Help needed moving from lying on your back to sitting on the side of a flat bed without using bedrails?: A Little Help needed moving to and from a bed to a chair (including a wheelchair)?: A Little Help needed standing up from a chair using your arms (e.g., wheelchair or bedside chair)?: A Lot Help needed to walk in hospital room?: A Little Help needed climbing 3-5 steps with a railing? : Total 6 Click Score: 15    End of Session Equipment Utilized During Treatment: Gait belt Activity Tolerance: Patient  tolerated treatment well Patient left: in chair;with call bell/phone within reach Nurse Communication: Mobility status (purwick malfunctiona and patient saturated in urine) PT Visit Diagnosis: Unsteadiness on feet (R26.81);Other abnormalities of gait and mobility (R26.89);Muscle weakness (generalized) (M62.81);Difficulty in walking, not elsewhere classified (R26.2);Other symptoms and signs involving the nervous system (R29.898);Hemiplegia and hemiparesis     Time: 0925-0953 PT Time Calculation (min) (ACUTE ONLY): 28 min  Charges:  $Gait Training: 8-22 mins $Neuromuscular Re-education: 8-22 mins                     Lewis Shock, PT, DPT Acute Rehabilitation Services Secure chat preferred Office #: [phone]    Iona Hansen 09/13/2022, 10:07 AM

## 2022-09-13 NOTE — H&P (Signed)
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  Presented 09/10/2023 for with acute onset of right-sided weakness.  MRI showed acute infarct in the bilateral corona radiata and in the left parietal lobe without hemorrhage.  MRA showed no intracranial large vessel occlusion or significant stenosis.  CT angiogram of head and neck with no emergent vascular findings.  Most recent echocardiogram with ejection fraction of 60 to 65% no wall motion abnormalities grade 1 diastolic dysfunction.  Neurology consulted patient currently remains on aspirin as well as Plavix therapy x 3 weeks then Plavix alone.  Lovenox added for DVT prophylaxis.  Plan for loop recorder placement.  Hospital course follow-up podiatry services for chronic diabetic foot ulcers bilaterally with wound care using Xeroform with Mepilex bandage change daily.  Patient reports having some right shoulder pain due to overuse, this is improved with Voltaren gel.  Reports she had a small bowel movement this morning.  Tolerating a regular consistency diet.  Therapy evaluations completed due to patient decreased functional mobility was admitted for a comprehensive rehab program.  She plans to stay with her sister who can assist after discharge 24/7.  One-story home with 5 steps to enter  Review of Systems  Constitutional:  Negative for chills and fever.  HENT:  Negative for congestion and  hearing loss.   Eyes:  Negative for blurred vision and double vision.  Respiratory:  Positive for shortness of breath. Negative for cough and wheezing.   Cardiovascular:  Negative for chest pain and palpitations.  Gastrointestinal:  Positive for constipation. Negative for abdominal pain, heartburn, nausea and vomiting.  Genitourinary:  Negative for dysuria, flank pain and hematuria.  Musculoskeletal:  Positive for joint pain and myalgias.  Skin:  Negative for rash.  Neurological:  Positive for sensory change and weakness. Negative for headaches.  Psychiatric/Behavioral:  Positive for depression. The patient has insomnia.   All other systems reviewed and are negative.  Past Medical History:  Diagnosis Date   AKI (acute kidney injury) (HCC) 08/09/2022   Breast cancer (HCC) 12/2019   left breast DCIS   CKD (chronic kidney disease) 06/07/2016   Stage 2, GFR 60-89 ml/min   Diabetic polyneuropathy 06/07/2016   Dyslipidemia    Endometrial adenocarcinoma 2012   Fever blister    Hyperlipidemia    Hypertension, essential 06/07/2016   Major depressive disorder    Mixed dyslipidemia 06/07/2016   MRSA infection 10/2019   left foot wound   Obstructive sleep apnea 06/07/2016   does not use CPAP   Peripheral neuropathy    Retinopathy    Right rib fracture 08/09/2022   Severe obesity    Skin ulcer of left foot with fat layer exposed 11/02/2016   Stroke    Asymptomatic, discovered via neuroimaging; small infarct in left parietal lobe; also concern for small b/l frontal infarcts   Tachycardia 07/19/2017   Traumatic rhabdomyolysis (HCC) 08/09/2022   Type 2 diabetes mellitus with hyperglycemia, with long-term current use of insulin  06/07/2016   Unilateral primary osteoarthritis, right knee 04/16/2019   UTI (urinary tract infection) 08/09/2022   Past Surgical History:  Procedure Laterality Date   ADRENALECTOMY     BREAST LUMPECTOMY WITH RADIOACTIVE SEED LOCALIZATION Left 02/25/2020    Procedure: LEFT BREAST LUMPECTOMY WITH RADIOACTIVE SEED LOCALIZATION;  Surgeon: Abigail Miyamoto, MD;  Location: [facility];  Service: General;  Laterality: Left;   BUNIONECTOMY     CARDIAC CATHETERIZATION     CERVICAL SPINE SURGERY     CHOLECYSTECTOMY     DILATION AND CURETTAGE OF UTERUS     LAPAROSCOPIC SALPINGOOPHERECTOMY     THORACOTOMY     TONSILLECTOMY     Family History  Problem Relation Age of Onset   Hypertension Mother    Alzheimer's disease Mother    Heart attack Father    CAD Father    Alzheimer's disease Father    Bladder Cancer Father        dx late 5s   Diverticulitis Sister    Obesity Sister    Hypertension Sister    Voice disorder Brother    Breast cancer Paternal Aunt 38   Prostate cancer Paternal Uncle        dx mid 50s   Social History:  reports that she has never smoked. She has never used smokeless tobacco. She reports that she does not drink alcohol and does not use drugs. Allergies:  Allergies  Allergen Reactions   Elavil [Amitriptyline] Other (See Comments)    Unknown reaction   Pristiq [Desvenlafaxine] Other (See Comments)    Unknown allergy   Cipro [Ciprofloxacin Hcl] Rash   Penicillins Rash   Sulfa Antibiotics Rash   Sulfamethoxazole Rash   Sulfur Rash   Medications Prior to Admission  Medication Sig Dispense Refill   amLODipine (NORVASC) 10 MG tablet Take 1 tablet (10 mg total) by mouth daily. 30 tablet 0   ascorbic acid (VITAMIN C) 500 MG tablet Take 1 tablet (500 mg total) by mouth daily. 100 tablet 0   aspirin EC 81 MG tablet Take 1 tablet (81 mg total) by mouth daily. Swallow whole.     atorvastatin (LIPITOR) 80 MG tablet Take 1 tablet (80 mg total) by mouth daily. 30 tablet 0   bacitracin 500 UNIT/GM ointment Apply 1 Application topically daily.     buPROPion (WELLBUTRIN XL) 300 MG 24 hr tablet Take 1 tablet (300 mg total) by mouth daily. 30 tablet 0   carvedilol (COREG) 6.25 MG tablet Take 1 tablet (6.25 mg total) by  mouth 2 (two) times daily. 60 tablet 0   Cholecalciferol (VITAMIN D) 125 MCG (5000 UT) CAPS Take 5,000 Units by mouth every evening.     escitalopram (LEXAPRO) 20 MG tablet Take 1 tablet (20 mg total) by mouth daily. 30 tablet 0   insulin NPH-regular Human (NOVOLIN 70/30) (70-30) 100 UNIT/ML injection Inject 20-24 Units into the skin See admin instructions. 24 units in the morning, 20 units in the evening     losartan (COZAAR) 25 MG tablet Take 1 tablet (25 mg total) by mouth every morning. 30 tablet 0   metFORMIN (GLUCOPHAGE) 500 MG tablet Take 1 tablet (500 mg total) by mouth 2 (two) times daily with a meal. 60 tablet 0   tamoxifen (NOLVADEX) 20 MG tablet TAKE ONE TABLET BY MOUTH EVERY MORNING (Patient taking differently: Take 20 mg by mouth daily.) 90 tablet 0   Zinc Sulfate 220 (50 Zn) MG TABS Take 1 tablet (220 mg total)  by mouth daily. 100 tablet 0   clopidogrel (PLAVIX) 75 MG tablet Take 1 tablet (75 mg total) by mouth daily. (Patient not taking: Reported on 09/11/2022) 5 tablet 0   doxycycline (VIBRA-TABS) 100 MG tablet Take 1 tablet (100 mg total) by mouth every 12 (twelve) hours. (Patient not taking: Reported on 09/11/2022) 6 tablet 0   metFORMIN (GLUCOPHAGE) 1000 MG tablet Take 1 tablet (1,000 mg total) by mouth 2 (two) times daily with a meal. (Patient not taking: Reported on 09/11/2022) 60 tablet 0      Home: Home Living Family/patient expects to be discharged to:: Private residence Living Arrangements:  (lives with sister and brother in Social worker) Available Help at Discharge: Family, Available 24 hours/day Type of Home: House Home Access: Stairs to enter Secretary/administrator of Steps: 5 Entrance Stairs-Rails: Right, Left Home Layout: One level Bathroom Shower/Tub: Engineer, manufacturing systems:  (bedside commode over top of it during the day) Bathroom Accessibility: Yes Home Equipment: Rollator (4 wheels), Tub bench, BSC/3in1 Additional Comments: [facility] PT and OT  Lives  With: Family   Functional History: Prior Function Prior Level of Function : Independent/Modified Independent, Driving Mobility Comments: Mod I with rollator ADLs Comments: tub bench for shower, pt was independent with all ADL except donning ted hose which family would assist with. family also assisted with IADL  Functional Status:  Mobility: Bed Mobility Overal bed mobility: Needs Assistance Bed Mobility: Supine to Sit Supine to sit: Min assist Sit to supine: Mod assist General bed mobility comments: pt able to bring LEs off EOB, minA for trunk elevation, tactile cues to bring L UE across to R hand rail to push up, assist to scoot forward so feet on the floor Transfers Overall transfer level: Needs assistance Equipment used: Rolling walker (2 wheels) Transfers: Sit to/from Stand Sit to Stand: Mod assist General transfer comment: modA to power up, easier from higher surface height Ambulation/Gait Ambulation/Gait assistance: Min assist Gait Distance (Feet): 120 Feet Assistive device: Rolling walker (2 wheels) Gait Pattern/deviations: Decreased step length - right, Step-through pattern, Decreased dorsiflexion - right, Trunk flexed, Decreased stride length General Gait Details: pt with improved ability to consistently clear R foot  this date for majority of time, onset of fatigue came s/p 21' requiring more verbal cues to incresaed R LE step height and to make the knee straight when advancing L LE to minimize R knee instability/buckling at onset of fatigue Gait velocity: dec Gait velocity interpretation: <1.31 ft/sec, indicative of household ambulator    ADL: ADL Overall ADL's : Needs assistance/impaired Eating/Feeding: Set up, Sitting Grooming: Set up, Sitting, Standing (patient alternating between sitting and standing at sink  PAtient standing fro approx 1 min before seated rest break) Upper Body Bathing: Set up, Sitting Lower Body Bathing: Minimal assistance, Sit to/from  stand Upper Body Dressing : Set up, Sitting Lower Body Dressing: Moderate assistance, Sit to/from Pharmacist, hospital Details (indicate cue type and reason): pt taking lateral side steps along EOB with minA, deferred further mobility secondary to fatigue Toileting- Clothing Manipulation and Hygiene: Moderate assistance, Sit to/from stand Toileting - Clothing Manipulation Details (indicate cue type and reason): assist due to heavy reliance on BUE support in standing Functional mobility during ADLs: Minimal assistance, Rolling walker (2 wheels), Cueing for safety General ADL Comments: pt limited by decreased endurance, decreased activity tolerance, instability and LE weakness  Cognition: Cognition Overall Cognitive Status: Within Functional Limits for tasks assessed Arousal/Alertness: Awake/alert Orientation Level: Oriented X4 Attention: Selective Sustained  Attention: Appears intact Selective Attention: Appears intact Memory: Appears intact Awareness: Appears intact Cognition Arousal/Alertness: Awake/alert Behavior During Therapy: Flat affect Overall Cognitive Status: Within Functional Limits for tasks assessed General Comments: slow processing and depressed spirits but able to follow all commands and expressed "I'm proud of all the stuff I did today.", perking up her spirits  Physical Exam: Blood pressure (!) 146/61, pulse 64, temperature 97.9 F (36.6 C), temperature source Oral, resp. rate 12, height 5\' 9"  (1.753 m), weight 103 kg, SpO2 98 %.  General:  No apparent distress HEENT: Head is normocephalic, atraumatic, PERRLA, EOMI, sclera anicteric, oral mucosa pink and moist Neck: Supple without JVD or lymphadenopathy Heart: Reg rate and rhythm. No murmurs rubs or gallops Chest: CTA bilaterally without wheezes, rales, or rhonchi; no distress Abdomen: Soft, non-tender, mildly-distended, bowel sounds positive. Extremities: No clubbing, cyanosis, or edema. Pulses are 2+ Psych: Pt's  affect is flat, a little anxious appearing Skin: Warm and dry Neuro: Alert and oriented to person, place, year, month, speech appears to be a slowed and delayed responses noted.  Occasional word finding difficulties.  Cranial nerves II through XII intact. Able to name and repeat. Recalls 2/3 words 5 min later.  Decreased sensation to light touch below her knees, intact in bilateral upper extremities and face Strength bilateral lower extremities 3/5 hip flexion, 4-/5 knee extension, 4- out of 5 ankle PF and DF Left upper extremity 4 out of 5 shoulder abduction, elbow flexion and extension, finger flexion Right upper extremity 4- out of 5 shoulder abduction, 4 out of 5 elbow flexion and extension, finger flexion DTR decreased in bilateral lower extremities at patella and ankle, DTR normal and symmetric bilateral upper extremities Musculoskeletal:  Mild right shoulder tenderness to palpation, minimal pain with shoulder passive ROM-improved  IV left upper extremity looks okay    Results for orders placed or performed during the hospital encounter of 09/10/22 (from the past 48 hour(s))  Glucose, capillary     Status: Abnormal   Collection Time: 09/12/22 12:39 PM  Result Value Ref Range   Glucose-Capillary 188 (H) 70 - 99 mg/dL    Comment: Glucose reference range applies only to samples taken after fasting for at least 8 hours.  Glucose, capillary     Status: Abnormal   Collection Time: 09/12/22  5:18 PM  Result Value Ref Range   Glucose-Capillary 131 (H) 70 - 99 mg/dL    Comment: Glucose reference range applies only to samples taken after fasting for at least 8 hours.  Glucose, capillary     Status: Abnormal   Collection Time: 09/12/22  9:28 PM  Result Value Ref Range   Glucose-Capillary 141 (H) 70 - 99 mg/dL    Comment: Glucose reference range applies only to samples taken after fasting for at least 8 hours.  Glucose, capillary     Status: Abnormal   Collection Time: 09/13/22  8:04 AM   Result Value Ref Range   Glucose-Capillary 151 (H) 70 - 99 mg/dL    Comment: Glucose reference range applies only to samples taken after fasting for at least 8 hours.  Glucose, capillary     Status: Abnormal   Collection Time: 09/13/22 11:50 AM  Result Value Ref Range   Glucose-Capillary 201 (H) 70 - 99 mg/dL    Comment: Glucose reference range applies only to samples taken after fasting for at least 8 hours.  Glucose, capillary     Status: Abnormal   Collection Time: 09/13/22  4:06 PM  Result Value Ref Range   Glucose-Capillary 345 (H) 70 - 99 mg/dL    Comment: Glucose reference range applies only to samples taken after fasting for at least 8 hours.  Glucose, capillary     Status: Abnormal   Collection Time: 09/13/22  9:38 PM  Result Value Ref Range   Glucose-Capillary 160 (H) 70 - 99 mg/dL    Comment: Glucose reference range applies only to samples taken after fasting for at least 8 hours.  Glucose, capillary     Status: Abnormal   Collection Time: 09/14/22  8:03 AM  Result Value Ref Range   Glucose-Capillary 166 (H) 70 - 99 mg/dL    Comment: Glucose reference range applies only to samples taken after fasting for at least 8 hours.   No results found.    Blood pressure (!) 146/61, pulse 64, temperature 97.9 F (36.6 C), temperature source Oral, resp. rate 12, height 5\' 9"  (1.753 m), weight 103 kg, SpO2 98 %.  Medical Problem List and Plan: 1. Functional deficits secondary to left basal ganglia and left parietal small/punctate infarcts as well as history of right MCA infarction.  Scheduled for loop recorder prior to admission to CIR  -patient may shower  -ELOS/Goals: 7 to 10 days, supervision with PT and OT  -Admit to CIR 2.  Antithrombotics: -DVT/anticoagulation:  Pharmaceutical: Lovenox  -antiplatelet therapy: Aspirin 81 mg daily and Plavix 75 mg daily x 3 weeks then Plavix alone 3. Pain Management: Voltaren gel 4 g 4 times daily, Tylenol as needed 4. Mood/Behavior/Sleep:  Wellbutrin 300 mg daily, Lexapro 20 mg daily, melatonin as needed  -antipsychotic agents: N/A 5. Neuropsych/cognition: This patient is capable of making decisions on her own behalf. 6. Skin/Wound Care:/Bilateral diabetic foot ulcers.  Skin care as directed routine skin checks 7. Fluids/Electrolytes/Nutrition: Routine in and outs with follow-up chemistries 8.  Diabetes mellitus with peripheral neuropathy.  Latest hemoglobin A1c 7.0.  NovoLog Mix 70/30 20 units twice daily  -Prior use of metformin, to restart if needed 9.  Hypertension.  Coreg 3.125 mg twice daily, Norvasc 5 mg daily.  Monitor with increased mobility.  Long-term BP goal normotensive.  Avoid hypotension/dehydration. 10.  Obesity.  BMI 33.52.  Dietary follow-up 11.  CKD IIIa, creatinine baseline 1.57-1.84.  Follow-up chemistries  12.  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  Follow-up outpatient Dr Pamelia Hoit.  Continue Nolvadex. 13.  Hyperlipidemia.  Lipitor 80 mg daily 14.  Right shoulder pain  -Improved, continue Voltaren gel      Charlton Amor, PA-C 09/14/2022

## 2022-09-14 ENCOUNTER — Encounter: Payer: PPO | Admitting: Physical Medicine and Rehabilitation

## 2022-09-14 ENCOUNTER — Inpatient Hospital Stay (HOSPITAL_COMMUNITY)
Admission: RE | Admit: 2022-09-14 | Discharge: 2022-09-30 | DRG: 057 | Disposition: A | Discharge status: Home Health | Payer: PPO | Source: Intra-hospital | Attending: Physical Medicine and Rehabilitation | Admitting: Physical Medicine and Rehabilitation

## 2022-09-14 ENCOUNTER — Other Ambulatory Visit: Payer: Self-pay

## 2022-09-14 ENCOUNTER — Encounter (HOSPITAL_COMMUNITY): Payer: Self-pay | Admitting: Physical Medicine and Rehabilitation

## 2022-09-14 ENCOUNTER — Encounter (HOSPITAL_COMMUNITY)
Admission: EM | Disposition: A | Discharge status: Rehabilitation Facility | Payer: Self-pay | Source: Home / Self Care | Attending: Internal Medicine

## 2022-09-14 DIAGNOSIS — E11319 Type 2 diabetes mellitus with unspecified diabetic retinopathy without macular edema: Secondary | ICD-10-CM | POA: Diagnosis present

## 2022-09-14 DIAGNOSIS — R339 Retention of urine, unspecified: Secondary | ICD-10-CM | POA: Diagnosis not present

## 2022-09-14 DIAGNOSIS — Z794 Long term (current) use of insulin: Secondary | ICD-10-CM | POA: Diagnosis not present

## 2022-09-14 DIAGNOSIS — M25511 Pain in right shoulder: Secondary | ICD-10-CM | POA: Diagnosis present

## 2022-09-14 DIAGNOSIS — L97519 Non-pressure chronic ulcer of other part of right foot with unspecified severity: Secondary | ICD-10-CM | POA: Diagnosis not present

## 2022-09-14 DIAGNOSIS — Z7982 Long term (current) use of aspirin: Secondary | ICD-10-CM

## 2022-09-14 DIAGNOSIS — E11621 Type 2 diabetes mellitus with foot ulcer: Secondary | ICD-10-CM | POA: Diagnosis not present

## 2022-09-14 DIAGNOSIS — I69351 Hemiplegia and hemiparesis following cerebral infarction affecting right dominant side: Principal | ICD-10-CM

## 2022-09-14 DIAGNOSIS — E669 Obesity, unspecified: Secondary | ICD-10-CM | POA: Diagnosis present

## 2022-09-14 DIAGNOSIS — L97429 Non-pressure chronic ulcer of left heel and midfoot with unspecified severity: Secondary | ICD-10-CM | POA: Diagnosis not present

## 2022-09-14 DIAGNOSIS — Z820 Family history of epilepsy and other diseases of the nervous system: Secondary | ICD-10-CM

## 2022-09-14 DIAGNOSIS — K5900 Constipation, unspecified: Secondary | ICD-10-CM | POA: Diagnosis not present

## 2022-09-14 DIAGNOSIS — Z79899 Other long term (current) drug therapy: Secondary | ICD-10-CM | POA: Diagnosis not present

## 2022-09-14 DIAGNOSIS — Z8673 Personal history of transient ischemic attack (TIA), and cerebral infarction without residual deficits: Secondary | ICD-10-CM

## 2022-09-14 DIAGNOSIS — I69398 Other sequelae of cerebral infarction: Secondary | ICD-10-CM | POA: Diagnosis not present

## 2022-09-14 DIAGNOSIS — L97529 Non-pressure chronic ulcer of other part of left foot with unspecified severity: Secondary | ICD-10-CM | POA: Diagnosis not present

## 2022-09-14 DIAGNOSIS — Z853 Personal history of malignant neoplasm of breast: Secondary | ICD-10-CM | POA: Diagnosis not present

## 2022-09-14 DIAGNOSIS — Z7902 Long term (current) use of antithrombotics/antiplatelets: Secondary | ICD-10-CM | POA: Diagnosis not present

## 2022-09-14 DIAGNOSIS — N1831 Chronic kidney disease, stage 3a: Secondary | ICD-10-CM | POA: Diagnosis present

## 2022-09-14 DIAGNOSIS — Z8542 Personal history of malignant neoplasm of other parts of uterus: Secondary | ICD-10-CM | POA: Diagnosis not present

## 2022-09-14 DIAGNOSIS — Z7984 Long term (current) use of oral hypoglycemic drugs: Secondary | ICD-10-CM

## 2022-09-14 DIAGNOSIS — I639 Cerebral infarction, unspecified: Secondary | ICD-10-CM | POA: Diagnosis not present

## 2022-09-14 DIAGNOSIS — E1122 Type 2 diabetes mellitus with diabetic chronic kidney disease: Secondary | ICD-10-CM | POA: Diagnosis not present

## 2022-09-14 DIAGNOSIS — E785 Hyperlipidemia, unspecified: Secondary | ICD-10-CM

## 2022-09-14 DIAGNOSIS — Z803 Family history of malignant neoplasm of breast: Secondary | ICD-10-CM

## 2022-09-14 DIAGNOSIS — I129 Hypertensive chronic kidney disease with stage 1 through stage 4 chronic kidney disease, or unspecified chronic kidney disease: Secondary | ICD-10-CM | POA: Diagnosis not present

## 2022-09-14 DIAGNOSIS — L97419 Non-pressure chronic ulcer of right heel and midfoot with unspecified severity: Secondary | ICD-10-CM | POA: Diagnosis not present

## 2022-09-14 DIAGNOSIS — G4733 Obstructive sleep apnea (adult) (pediatric): Secondary | ICD-10-CM | POA: Diagnosis present

## 2022-09-14 DIAGNOSIS — Z6833 Body mass index (BMI) 33.0-33.9, adult: Secondary | ICD-10-CM | POA: Diagnosis not present

## 2022-09-14 DIAGNOSIS — E1142 Type 2 diabetes mellitus with diabetic polyneuropathy: Secondary | ICD-10-CM | POA: Diagnosis not present

## 2022-09-14 DIAGNOSIS — E782 Mixed hyperlipidemia: Secondary | ICD-10-CM | POA: Diagnosis not present

## 2022-09-14 DIAGNOSIS — Z8249 Family history of ischemic heart disease and other diseases of the circulatory system: Secondary | ICD-10-CM

## 2022-09-14 DIAGNOSIS — Z8052 Family history of malignant neoplasm of bladder: Secondary | ICD-10-CM

## 2022-09-14 DIAGNOSIS — I10 Essential (primary) hypertension: Secondary | ICD-10-CM | POA: Diagnosis not present

## 2022-09-14 HISTORY — PX: LOOP RECORDER INSERTION: EP1214

## 2022-09-14 LAB — GLUCOSE, CAPILLARY
Glucose-Capillary: 166 mg/dL — ABNORMAL HIGH (ref 70–99)
Glucose-Capillary: 232 mg/dL — ABNORMAL HIGH (ref 70–99)
Glucose-Capillary: 190 mg/dL — ABNORMAL HIGH (ref 70–99)
Glucose-Capillary: 224 mg/dL — ABNORMAL HIGH (ref 70–99)

## 2022-09-14 LAB — CBC
HCT: 29.9 % — ABNORMAL LOW (ref 36.0–46.0)
Hemoglobin: 9.9 g/dL — ABNORMAL LOW (ref 12.0–15.0)
MCH: 32.6 pg (ref 26.0–34.0)
MCHC: 33.1 g/dL (ref 30.0–36.0)
MCV: 98.4 fL (ref 80.0–100.0)
Platelets: 226 10*3/uL (ref 150–400)
RBC: 3.04 MIL/uL — ABNORMAL LOW (ref 3.87–5.11)
RDW: 13.3 % (ref 11.5–15.5)
WBC: 8.2 10*3/uL (ref 4.0–10.5)
nRBC: 0 % (ref 0.0–0.2)

## 2022-09-14 LAB — CREATININE, SERUM
Creatinine, Ser: 1.78 mg/dL — ABNORMAL HIGH (ref 0.44–1.00)
GFR, Estimated: 30 mL/min — ABNORMAL LOW (ref >60)

## 2022-09-14 SURGERY — LOOP RECORDER INSERTION

## 2022-09-14 MED ORDER — ACETAMINOPHEN 650 MG RE SUPP: 650.0000 mg | RECTAL | Status: DC | PRN

## 2022-09-14 MED ORDER — CLOPIDOGREL BISULFATE 75 MG PO TABS
75.0000 mg | ORAL_TABLET | Freq: Every day | ORAL | Status: DC
  Administered 2022-09-15 – 2022-09-30 (×16): 75 mg via ORAL
  Filled 2022-09-14 (×16): qty 1

## 2022-09-14 MED ORDER — AMLODIPINE BESYLATE 10 MG PO TABS: 5.0000 mg | ORAL_TABLET | Freq: Every day | ORAL | 0 refills | Status: DC

## 2022-09-14 MED ORDER — TAMOXIFEN CITRATE 10 MG PO TABS
20.0000 mg | ORAL_TABLET | Freq: Every day | ORAL | Status: DC
  Administered 2022-09-15 – 2022-09-30 (×16): 20 mg via ORAL
  Filled 2022-09-14 (×16): qty 2

## 2022-09-14 MED ORDER — ACETAMINOPHEN 325 MG PO TABS: 650.0000 mg | ORAL_TABLET | ORAL | Status: DC | PRN

## 2022-09-14 MED ORDER — ENOXAPARIN SODIUM 40 MG/0.4ML IJ SOSY: 40.0000 mg | PREFILLED_SYRINGE | INTRAMUSCULAR | Status: DC

## 2022-09-14 MED ORDER — AMLODIPINE BESYLATE 5 MG PO TABS
5.0000 mg | ORAL_TABLET | Freq: Every day | ORAL | Status: DC
  Administered 2022-09-15 – 2022-09-16 (×2): 5 mg via ORAL
  Filled 2022-09-14 (×2): qty 1

## 2022-09-14 MED ORDER — INSULIN ASPART 100 UNIT/ML IJ SOLN
0.0000 [IU] | Freq: Three times a day (TID) | INTRAMUSCULAR | Status: DC
  Administered 2022-09-15 (×2): 3 [IU] via SUBCUTANEOUS
  Administered 2022-09-15: 1 [IU] via SUBCUTANEOUS
  Administered 2022-09-16: 2 [IU] via SUBCUTANEOUS
  Administered 2022-09-16: 1 [IU] via SUBCUTANEOUS
  Administered 2022-09-16: 3 [IU] via SUBCUTANEOUS
  Administered 2022-09-17: 1 [IU] via SUBCUTANEOUS
  Administered 2022-09-17: 3 [IU] via SUBCUTANEOUS
  Administered 2022-09-17: 1 [IU] via SUBCUTANEOUS
  Administered 2022-09-18: 5 [IU] via SUBCUTANEOUS
  Administered 2022-09-18 – 2022-09-19 (×2): 1 [IU] via SUBCUTANEOUS
  Administered 2022-09-19: 5 [IU] via SUBCUTANEOUS
  Administered 2022-09-20 (×2): 2 [IU] via SUBCUTANEOUS
  Administered 2022-09-20: 1 [IU] via SUBCUTANEOUS
  Administered 2022-09-21: 2 [IU] via SUBCUTANEOUS
  Administered 2022-09-21 – 2022-09-22 (×2): 3 [IU] via SUBCUTANEOUS
  Administered 2022-09-22: 2 [IU] via SUBCUTANEOUS
  Administered 2022-09-22: 1 [IU] via SUBCUTANEOUS
  Administered 2022-09-23 (×2): 2 [IU] via SUBCUTANEOUS
  Administered 2022-09-23 – 2022-09-24 (×4): 1 [IU] via SUBCUTANEOUS
  Administered 2022-09-25: 3 [IU] via SUBCUTANEOUS
  Administered 2022-09-25: 2 [IU] via SUBCUTANEOUS
  Administered 2022-09-25: 1 [IU] via SUBCUTANEOUS
  Administered 2022-09-26: 2 [IU] via SUBCUTANEOUS
  Administered 2022-09-26: 1 [IU] via SUBCUTANEOUS
  Administered 2022-09-26 – 2022-09-27 (×3): 2 [IU] via SUBCUTANEOUS
  Administered 2022-09-27: 5 [IU] via SUBCUTANEOUS
  Administered 2022-09-28: 2 [IU] via SUBCUTANEOUS
  Administered 2022-09-28: 1 [IU] via SUBCUTANEOUS
  Administered 2022-09-28: 2 [IU] via SUBCUTANEOUS
  Administered 2022-09-29 (×2): 1 [IU] via SUBCUTANEOUS

## 2022-09-14 MED ORDER — ASPIRIN 81 MG PO TBEC
81.0000 mg | DELAYED_RELEASE_TABLET | Freq: Every day | ORAL | Status: DC
  Administered 2022-09-15 – 2022-09-30 (×16): 81 mg via ORAL
  Filled 2022-09-14 (×16): qty 1

## 2022-09-14 MED ORDER — LIDOCAINE-EPINEPHRINE 1 %-1:100000 IJ SOLN
INTRAMUSCULAR | Status: AC
  Filled 2022-09-14: qty 1

## 2022-09-14 MED ORDER — LIDOCAINE-EPINEPHRINE 1 %-1:100000 IJ SOLN
INTRAMUSCULAR | Status: DC | PRN
  Administered 2022-09-14: 10 mL via INTRADERMAL

## 2022-09-14 MED ORDER — DICLOFENAC SODIUM 1 % EX GEL: 4.0000 g | Freq: Four times a day (QID) | CUTANEOUS | Status: DC

## 2022-09-14 MED ORDER — MELATONIN 3 MG PO TABS: 3.0000 mg | ORAL_TABLET | Freq: Every evening | ORAL | Status: DC | PRN

## 2022-09-14 MED ORDER — CARVEDILOL 3.125 MG PO TABS: 3.1250 mg | ORAL_TABLET | Freq: Two times a day (BID) | ORAL | Status: DC

## 2022-09-14 MED ORDER — ATORVASTATIN CALCIUM 80 MG PO TABS
80.0000 mg | ORAL_TABLET | Freq: Every day | ORAL | Status: DC
  Administered 2022-09-15 – 2022-09-30 (×16): 80 mg via ORAL
  Filled 2022-09-14 (×16): qty 1

## 2022-09-14 MED ORDER — CARVEDILOL 3.125 MG PO TABS
3.1250 mg | ORAL_TABLET | Freq: Two times a day (BID) | ORAL | Status: DC
  Administered 2022-09-14 – 2022-09-30 (×32): 3.125 mg via ORAL
  Filled 2022-09-14 (×34): qty 1

## 2022-09-14 MED ORDER — DICLOFENAC SODIUM 1 % EX GEL
4.0000 g | Freq: Four times a day (QID) | CUTANEOUS | Status: DC
  Administered 2022-09-14 – 2022-09-29 (×30): 4 g via TOPICAL

## 2022-09-14 MED ORDER — BUPROPION HCL ER (XL) 300 MG PO TB24
300.0000 mg | ORAL_TABLET | Freq: Every day | ORAL | Status: AC
  Administered 2022-09-15 – 2022-09-20 (×6): 300 mg via ORAL
  Filled 2022-09-14 (×6): qty 1

## 2022-09-14 MED ORDER — ENOXAPARIN SODIUM 40 MG/0.4ML IJ SOSY
40.0000 mg | PREFILLED_SYRINGE | INTRAMUSCULAR | Status: DC
  Administered 2022-09-14 – 2022-09-29 (×16): 40 mg via SUBCUTANEOUS
  Filled 2022-09-14 (×16): qty 0.4

## 2022-09-14 MED ORDER — ACETAMINOPHEN 160 MG/5ML PO SOLN: 650.0000 mg | ORAL | Status: DC | PRN

## 2022-09-14 MED ORDER — ESCITALOPRAM OXALATE 10 MG PO TABS
20.0000 mg | ORAL_TABLET | Freq: Every day | ORAL | Status: DC
  Administered 2022-09-15 – 2022-09-30 (×16): 20 mg via ORAL
  Filled 2022-09-14 (×16): qty 2

## 2022-09-14 MED ORDER — ASPIRIN 81 MG PO TBEC: 81.0000 mg | DELAYED_RELEASE_TABLET | Freq: Every day | ORAL | 0 refills | Status: DC

## 2022-09-14 MED ORDER — INSULIN ASPART PROT & ASPART (70-30 MIX) 100 UNIT/ML ~~LOC~~ SUSP
20.0000 [IU] | Freq: Two times a day (BID) | SUBCUTANEOUS | Status: DC
  Administered 2022-09-15 – 2022-09-17 (×5): 20 [IU] via SUBCUTANEOUS

## 2022-09-14 SURGICAL SUPPLY — 2 items
MONITOR LUX-DX II+ M312 (Prosthesis & Implant Heart) IMPLANT
PACK LOOP INSERTION (CUSTOM PROCEDURE TRAY) ×1 IMPLANT

## 2022-09-14 NOTE — Discharge Instructions (Signed)
Care After Your Loop Recorder   Monitor your cardiac device site for redness, swelling, and drainage. Call the device clinic at [phone] if you experience these symptoms or fever/chills.  If you notice bleeding from your site, hold firm, but gently pressure with two fingers for 5 minutes. Dried blood on the steri-strips when removing the outer bandage is normal.   Keep the large square bandage on your site for 24 hours and then you may remove it yourself. Keep the steri-strips underneath in place.   You may shower after 72 hours / 3 days from your procedure with the steri-strips in place. They will usually fall off on their own, or may be removed after 10 days. Pat dry.   Avoid lotions, ointments, or perfumes over your incision until it is well-healed.  Please do not submerge in water until your site is completely healed.   Your device is MRI compatible.   Remote monitoring is used to monitor your cardiac device from home. This monitoring is scheduled every month by our office. It allows us to keep an eye on the function of your device to ensure it is working properly.    

## 2022-09-14 NOTE — Progress Notes (Signed)
  Inpatient Rehabilitation Admissions Coordinator   I have CIR bed to admit her to today after LOOP placed, Acute team and TOC aware. I will make the arrangements.  Ottie Glazier, RN, MSN Rehab Admissions Coordinator [phone] 09/14/2022 10:27 AM

## 2022-09-14 NOTE — Progress Notes (Signed)
Inpatient Rehabilitation Admission Medication Review by a Pharmacist  A complete drug regimen review was completed for this patient to identify any potential clinically significant medication issues.  High Risk Drug Classes Is patient taking? Indication by Medication  Antipsychotic No   Anticoagulant Yes Enoxaparin - VTE ppx  Antibiotic No   Opioid No   Antiplatelet Yes Aspirin, clopidogrel - stroke ppx  Hypoglycemics/insulin Yes Insulin aspart, 70/30 - DM  Vasoactive Medication Yes Amlodipine, carvedilol - HTN  Chemotherapy No   Other Yes Atorvastatin - HLD Tamoxifen - hx breast cancer Diclofenac gel - topical pain Bupropion, escitalopram - mood Melatonin - PRN sleep     Type of Medication Issue Identified Description of Issue Recommendation(s)  Drug Interaction(s) (clinically significant)     Duplicate Therapy     Allergy     No Medication Administration End Date     Incorrect Dose     Additional Drug Therapy Needed     Significant med changes from prior encounter (inform family/care partners about these prior to discharge). Losartan discontinued during IP admission Metformin not resumed in CIR Communicate medication changes with patient/family at discharge  Other       Clinically significant medication issues were identified that warrant physician communication and completion of prescribed/recommended actions by midnight of the next day:  No   Pharmacist comments: n/a   Time spent performing this drug regimen review (minutes): 20   Thank you for allowing pharmacy to be a part of this patient's care.  Thelma Barge, PharmD Clinical Pharmacist

## 2022-09-14 NOTE — Progress Notes (Signed)
SURGEON:  Francis Dowse, PA-C Assistant: Steffanie Dunn, MD     PREPROCEDURE DIAGNOSIS:  Cryptogenic stroke    POSTPROCEDURE DIAGNOSIS: Cryptogenic stroke     PROCEDURES:   1. Implantable loop recorder implantation    INTRODUCTION:  [name] presents with a history of cryptogenic stroke The costs of loop recorder monitoring have been discussed with the patient.    DESCRIPTION OF PROCEDURE:  Informed written consent was obtained.  A preprocedural timeout was performed with the RN. The patient required no sedation for the procedure today.  Mapping over the patient's chest was performed to identify the area where electrograms were most prominent for ILR recording.  This area was found to be the left parasternal region over the 4th intercostal space. The patients left chest was therefore prepped and draped in the usual sterile fashion. The skin overlying the left parasternal region was infiltrated with lidocaine for local analgesia.  A 0.5-cm incision was made over the left parasternal region over the 3rd intercostal space.  A subcutaneous ILR pocket was fashioned using a combination of sharp and blunt dissection.  A [employer] LUX [phone]) implantable loop recorder was then placed into the pocket  R waves were very prominent and measured >0.79mV.  Steri- Strips and a sterile dressing were then applied.  There were no early apparent complications.      Holly Prude, MD, was present during the entirety of the case for supervision and proctoring.    CONCLUSIONS:   1. Successful implantation of a implantable loop recorder for Cryptogenic stroke  2. No early apparent complications.   Francis Dowse, PA-C      Estimated blood loss <50 mL.   During this procedure no sedation was administered.

## 2022-09-14 NOTE — Consult Note (Signed)
ELECTROPHYSIOLOGY CONSULT NOTE  [name] MRN: [id number], DOB/AGE: 1950/10/06   Admit date: 09/10/2022 Date of Consult: 09/14/2022  Primary Physician: Laurann Montana, MD Primary Cardiologist: Dr. Rennis Golden 2021 Reason for Consultation: Cryptogenic stroke - recommendations regarding Implantable Loop Recorder  History of Present Illness [name] was admitted on 09/10/2022 with new R sided weakness, found with new strokes.    PMHx includes: breast CA, CKD (II), HTN, HLD, DM, obesity, stroke Cardiac: last saw Dr. Rennis Golden Nov 2021, remote [place], + mention of palpitations controlled with BB, no formal diagnosis, no mention of AFib  May 2024 hospitalized with stroke, discharged and completed rehab and her DAPT > ASA alone, unfortunately with recurrent symptoms found with Acute infarcts in the bilateral corona radiata and in the left parietal lobe, new compared to 08/08/2022   Neurology notes: new left BG and left parietal small / punctate infarcts, concerning for Cardio-embolic source. .  she has undergone workup for stroke including echocardiogram and carotid dopplers.  The patient has been monitored on telemetry which has demonstrated sinus rhythm with no arrhythmias.   Echocardiogram w/bubble study 08/09/22 (with her last stroke)  1. Left ventricular ejection fraction, by estimation, is 60 to 65%. The  left ventricle has normal function. The left ventricle has no regional  wall motion abnormalities. There is mild concentric left ventricular  hypertrophy. Left ventricular diastolic  parameters are consistent with Grade I diastolic dysfunction (impaired  relaxation).   2. Right ventricular systolic function is normal. The right ventricular  size is normal. Tricuspid regurgitation signal is inadequate for assessing  PA pressure.   3. Left atrial size was mild to moderately dilated.   4. The mitral valve is normal in structure. No evidence of mitral valve   regurgitation. No evidence of mitral stenosis.   5. The aortic valve is tricuspid. There is mild thickening of the aortic  valve. Aortic valve regurgitation is not visualized. Aortic valve  sclerosis is present, with no evidence of aortic valve stenosis.   6. The inferior vena cava is normal in size with greater than 50%  respiratory variability, suggesting right atrial pressure of 3 mmHg.   7. Agitated saline contrast bubble study was negative, with no evidence  of any interatrial shunt.   Lab work is reviewed.  Prior to admission, the patient denies chest pain, shortness of breath, dizziness, palpitations, or syncope.  They are recovering from their stroke with plans to CIR at discharge.    Past Medical History:  Diagnosis Date   AKI (acute kidney injury) (HCC) 08/09/2022   Breast cancer (HCC) 12/2019   left breast DCIS   CKD (chronic kidney disease) 06/07/2016   Stage 2, GFR 60-89 ml/min   Diabetic polyneuropathy 06/07/2016   Dyslipidemia    Endometrial adenocarcinoma 2012   Fever blister    Hyperlipidemia    Hypertension, essential 06/07/2016   Major depressive disorder    Mixed dyslipidemia 06/07/2016   MRSA infection 10/2019   left foot wound   Obstructive sleep apnea 06/07/2016   does not use CPAP   Peripheral neuropathy    Retinopathy    Right rib fracture 08/09/2022   Severe obesity    Skin ulcer of left foot with fat layer exposed 11/02/2016   Stroke    Asymptomatic, discovered via neuroimaging; small infarct in left parietal lobe; also concern for small b/l frontal infarcts   Tachycardia 07/19/2017   Traumatic rhabdomyolysis (HCC) 08/09/2022   Type 2 diabetes  mellitus with hyperglycemia, with long-term current use of insulin 06/07/2016   Unilateral primary osteoarthritis, right knee 04/16/2019   UTI (urinary tract infection) 08/09/2022     Surgical History:  Past Surgical History:  Procedure Laterality Date   ADRENALECTOMY     BREAST LUMPECTOMY WITH  RADIOACTIVE SEED LOCALIZATION Left 02/25/2020   Procedure: LEFT BREAST LUMPECTOMY WITH RADIOACTIVE SEED LOCALIZATION;  Surgeon: Abigail Miyamoto, MD;  Location: [facility];  Service: General;  Laterality: Left;   BUNIONECTOMY     CARDIAC CATHETERIZATION     CERVICAL SPINE SURGERY     CHOLECYSTECTOMY     DILATION AND CURETTAGE OF UTERUS     LAPAROSCOPIC SALPINGOOPHERECTOMY     THORACOTOMY     TONSILLECTOMY       Medications Prior to Admission  Medication Sig Dispense Refill Last Dose   ascorbic acid (VITAMIN C) 500 MG tablet Take 1 tablet (500 mg total) by mouth daily. 100 tablet 0 09/09/2022   atorvastatin (LIPITOR) 80 MG tablet Take 1 tablet (80 mg total) by mouth daily. 30 tablet 0 09/09/2022   bacitracin 500 UNIT/GM ointment Apply 1 Application topically daily.   09/09/2022   buPROPion (WELLBUTRIN XL) 300 MG 24 hr tablet Take 1 tablet (300 mg total) by mouth daily. 30 tablet 0 09/09/2022   Cholecalciferol (VITAMIN D) 125 MCG (5000 UT) CAPS Take 5,000 Units by mouth every evening.   09/09/2022   escitalopram (LEXAPRO) 20 MG tablet Take 1 tablet (20 mg total) by mouth daily. 30 tablet 0 09/09/2022   insulin NPH-regular Human (NOVOLIN 70/30) (70-30) 100 UNIT/ML injection Inject 20-24 Units into the skin See admin instructions. 24 units in the morning, 20 units in the evening   09/09/2022   losartan (COZAAR) 25 MG tablet Take 1 tablet (25 mg total) by mouth every morning. 30 tablet 0 09/09/2022   metFORMIN (GLUCOPHAGE) 500 MG tablet Take 1 tablet (500 mg total) by mouth 2 (two) times daily with a meal. 60 tablet 0 09/09/2022   tamoxifen (NOLVADEX) 20 MG tablet TAKE ONE TABLET BY MOUTH EVERY MORNING (Patient taking differently: Take 20 mg by mouth daily.) 90 tablet 0 09/09/2022   Zinc Sulfate 220 (50 Zn) MG TABS Take 1 tablet (220 mg total) by mouth daily. 100 tablet 0 09/09/2022   [DISCONTINUED] amLODipine (NORVASC) 10 MG tablet Take 1 tablet (10 mg total) by mouth daily. 30 tablet 0  09/09/2022   [DISCONTINUED] aspirin EC 81 MG tablet Take 1 tablet (81 mg total) by mouth daily. Swallow whole.   09/09/2022   [DISCONTINUED] carvedilol (COREG) 6.25 MG tablet Take 1 tablet (6.25 mg total) by mouth 2 (two) times daily. 60 tablet 0 09/09/2022 at 1900   clopidogrel (PLAVIX) 75 MG tablet Take 1 tablet (75 mg total) by mouth daily. (Patient not taking: Reported on 09/11/2022) 5 tablet 0 Completed Course   doxycycline (VIBRA-TABS) 100 MG tablet Take 1 tablet (100 mg total) by mouth every 12 (twelve) hours. (Patient not taking: Reported on 09/11/2022) 6 tablet 0 Completed Course   metFORMIN (GLUCOPHAGE) 1000 MG tablet Take 1 tablet (1,000 mg total) by mouth 2 (two) times daily with a meal. (Patient not taking: Reported on 09/11/2022) 60 tablet 0 Not Taking    Inpatient Medications:   amLODipine  5 mg Oral Daily   aspirin EC  81 mg Oral Daily   atorvastatin  80 mg Oral Daily   buPROPion  300 mg Oral Daily   carvedilol  3.125 mg Oral BID  clopidogrel  75 mg Oral Daily   diclofenac Sodium  4 g Topical QID   enoxaparin (LOVENOX) injection  40 mg Subcutaneous Q24H   escitalopram  20 mg Oral Daily   insulin aspart  0-9 Units Subcutaneous TID WC   insulin aspart protamine- aspart  20 Units Subcutaneous BID WC   tamoxifen  20 mg Oral Daily    Allergies:  Allergies  Allergen Reactions   Elavil [Amitriptyline] Other (See Comments)    Unknown reaction   Pristiq [Desvenlafaxine] Other (See Comments)    Unknown allergy   Cipro [Ciprofloxacin Hcl] Rash   Penicillins Rash   Sulfa Antibiotics Rash   Sulfamethoxazole Rash   Sulfur Rash    Social History   Socioeconomic History   Marital status: Single    Spouse name: Not on file   Number of children: Not on file   Years of education: 16   Highest education level: Bachelor's degree (e.g., BA, AB, BS)  Occupational History   Occupation: Retired    Comment: Runner, broadcasting/film/video  Tobacco Use   Smoking status: Never   Smokeless tobacco: Never   Vaping Use   Vaping Use: Never used  Substance and Sexual Activity   Alcohol use: No   Drug use: No   Sexual activity: Not Currently    Birth control/protection: Surgical  Other Topics Concern   Not on file  Social History Narrative   Right Handed   Lives in one story home   Does not drink caffeine   Social Determinants of Health   Financial Resource Strain: Not on file  Food Insecurity: Not on file  Transportation Needs: Not on file  Physical Activity: Not on file  Stress: Not on file  Social Connections: Not on file  Intimate Partner Violence: Not on file     Family History  Problem Relation Age of Onset   Hypertension Mother    Alzheimer's disease Mother    Heart attack Father    CAD Father    Alzheimer's disease Father    Bladder Cancer Father        dx late 60s   Diverticulitis Sister    Obesity Sister    Hypertension Sister    Voice disorder Brother    Breast cancer Paternal Aunt 72   Prostate cancer Paternal Uncle        dx mid 24s      Review of Systems: General: No chills, fever, night sweats or weight changes  Cardiovascular:  No chest pain, dyspnea on exertion, edema, orthopnea, palpitations, paroxysmal nocturnal dyspnea Dermatological: No rash, lesions or masses Respiratory: No cough, dyspnea Urologic: No hematuria, dysuria Abdominal: No nausea, vomiting, diarrhea, bright red blood per rectum, melena, or hematemesis Neurologic: No visual changes, weakness, changes in mental status All other systems reviewed and are otherwise negative except as noted above.  Physical Exam: Vitals:   09/13/22 2335 09/14/22 0400 09/14/22 0800 09/14/22 0859  BP: (!) 138/54 (!) 149/69 (!) 146/61   Pulse: 65 69 64   Resp: 17 17 12    Temp: 98.5 F (36.9 C) 98.3 F (36.8 C)  97.9 F (36.6 C)  TempSrc: Oral Oral  Oral  SpO2: 98% 94% 98%   Weight:      Height:        GEN- The patient is well appearing, alert and oriented x 3 today.   Head- normocephalic,  atraumatic Eyes-  Sclera clear, conjunctiva pink Ears- hearing intact Oropharynx- clear Neck- supple Lungs- Clear to ausculation bilaterally,  normal work of breathing Heart- Regular rate and rhythm, no murmurs, rubs or gallops  GI- soft, NT, ND, + BS Extremities- no clubbing, cyanosis, or edema MS- no significant deformity or atrophy Skin- no rash or lesion Psych- euthymic mood, full affect   Labs:   Lab Results  Component Value Date   WBC 5.5 09/11/2022   HGB 10.3 (L) 09/11/2022   HCT 31.0 (L) 09/11/2022   MCV 96.6 09/11/2022   PLT 224 09/11/2022    Recent Labs  Lab 09/10/22 0527 09/11/22 0500 09/12/22 0439  NA 133*   < > 136  K 4.3   < > 3.5  CL 101   < > 104  CO2 22   < > 23  BUN 32*   < > 24*  CREATININE 1.82*   < > 1.47*  CALCIUM 8.9   < > 8.7*  PROT 6.9  --   --   BILITOT 0.7  --   --   ALKPHOS 42  --   --   ALT 18  --   --   AST 18  --   --   GLUCOSE 155*   < > 133*   < > = values in this interval not displayed.   Lab Results  Component Value Date   CKTOTAL 1,265 (H) 08/08/2022   CKMB 15.9 (H) 07/31/2007   TROPONINI (HH) 07/31/2007    1.32        POSSIBLE MYOCARDIAL ISCHEMIA. SERIAL TESTING RECOMMENDED. CRITICAL RESULT CALLED TO, READ BACK BY AND VERIFIED WITH: TOMLINSON,T RN 07/31/07 2140 WOOTEN,K   Lab Results  Component Value Date   CHOL 121 08/09/2022   CHOL  07/31/2007    117        ATP III CLASSIFICATION:  <200     mg/dL   Desirable  962-952  mg/dL   Borderline High  >=841    mg/dL   High   Lab Results  Component Value Date   HDL 34 (L) 08/09/2022   HDL 29 (L) 07/31/2007   Lab Results  Component Value Date   LDLCALC 58 08/09/2022   LDLCALC  07/31/2007    46        Total Cholesterol/HDL:CHD Risk Coronary Heart Disease Risk Table                     Men   Women  1/2 Average Risk   3.4   3.3   Lab Results  Component Value Date   TRIG 144 08/09/2022   TRIG 211 (H) 07/31/2007   Lab Results  Component Value Date   CHOLHDL 3.6  08/09/2022   CHOLHDL 4.0 07/31/2007   No results found for: "LDLDIRECT"  No results found for: "DDIMER"   Radiology/Studies:  VAS [place] LOWER EXTREMITY VENOUS (DVT) Result Date: 09/11/2022  Lower Venous DVT Study Patient Name:  [name]  Date of Exam:   09/11/2022 Medical Rec #: [id number]        Accession #:    [id number] Date of Birth: 06-Mar-1951        Patient Gender: F Patient Age:   82 years Exam Location:  [facility] Procedure:      VAS [place] LOWER EXTREMITY VENOUS (DVT) Referring Phys: Lanae Boast --------------------------------------------------------------------------------  Indications: Edema, and stroke.  Limitations: Body habitus and patient unable to position legs. Comparison Study: Negative left LEV done 09/13/17 Performing Technologist: Sherren Kerns RVS  Examination Guidelines: A complete evaluation includes B-mode imaging, spectral  Doppler, color Doppler, and power Doppler as needed of all accessible portions of each vessel. Bilateral testing is considered an integral part of a complete examination. Limited examinations for reoccurring indications may be performed as noted. The reflux portion of the exam is performed with the patient in reverse Trendelenburg.   Summary: BILATERAL: - No evidence of deep vein thrombosis seen in the lower extremities, bilaterally. -No evidence of popliteal cyst, bilaterally.   *See table(s) above for measurements and observations. Electronically signed by Waverly Ferrari MD on 09/11/2022 at 8:39:28 PM.    Final    CT ANGIO HEAD NECK W WO CM Result Date: 09/11/2022 CLINICAL DATA:  Stroke, determine embolic source. EXAM: CT ANGIOGRAPHY HEAD AND NECK WITH AND WITHOUT CONTRAST TECHNIQUE: Multidetector CT imaging of the head and neck was performed using the standard protocol during bolus administration of intravenous contrast. Multiplanar CT image reconstructions and MIPs were obtained to evaluate the vascular anatomy. Carotid stenosis measurements  (when applicable) are obtained utilizing NASCET criteria, using the distal internal carotid diameter as the denominator. RADIATION DOSE REDUCTION: This exam was performed according to the departmental dose-optimization program which includes automated exposure control, adjustment of the mA and/or kV according to patient size and/or use of iterative reconstruction technique. CONTRAST:  75mL OMNIPAQUE IOHEXOL 350 MG/ML SOLN COMPARISON:  Brain MRI from yesterday FINDINGS: CT HEAD FINDINGS Brain: Small acute infarcts are largely occult compared to brain MRI. Multiple chronic infarcts including along the bilateral frontal parietal cortex (more extensive on the left) and at the left corona radiata. Small chronic cerebellar infarcts. No acute hemorrhage, hydrocephalus, or masslike finding. Vascular: No hyperdense vessel or unexpected calcification. Skull: Normal. Negative for fracture or focal lesion. Sinuses/Orbits: No acute finding Review of the MIP images confirms the above findings CTA NECK FINDINGS Aortic arch: Atheromatous plaque with 3 vessel branching. Right carotid system: Atheromatous calcification at the carotid bifurcation. No flow reducing stenosis or ulceration. Left carotid system: Moderate mixed density plaque primarily at the bifurcation. No stenosis or ulceration. Vertebral arteries: No proximal subclavian stenosis although there is atherosclerosis on the right. The vertebral arteries are smoothly contoured and widely patent Skeleton: C5-6 ACDF with solid arthrodesis. Other neck: No acute finding Upper chest: No acute finding Review of the MIP images confirms the above findings CTA HEAD FINDINGS Anterior circulation: No branch occlusion, beading, or aneurysm. Atheromatous calcification along the carotid siphons. Hypoplastic right A1 segment Posterior circulation: The vertebral and basilar arteries are smoothly contoured and widely patent. No branch occlusion, beading, or aneurysm. Venous sinuses:  Diffusely patent Anatomic variants: As above Review of the MIP images confirms the above findings IMPRESSION: No emergent vascular finding. There is atherosclerosis without flow reducing stenosis or ulceration of major arteries in the head and neck. Electronically Signed   By: Tiburcio Pea M.D.   On: 09/11/2022 10:22   MR ANGIO HEAD WO CONTRAST Result Date: 09/10/2022 CLINICAL DATA:  Stroke/TIA, determine embolic source EXAM: MRA HEAD WITHOUT CONTRAST TECHNIQUE: Angiographic images of the Circle of Willis were acquired using MRA technique without intravenous contrast. COMPARISON:  MRA head 08/09/22 FINDINGS: Anterior circulation: No significant stenosis. No proximal occlusion. No aneurysm. Posterior circulation: No significant stenosis. No proximal occlusion. No aneurysm. Anatomic variants: No Other: None. IMPRESSION: No intracranial large vessel occlusion or significant stenosis. Consider further evaluation with an MRA of the neck, this is not previously performed Electronically Signed   By: Lorenza Cambridge M.D.   On: 09/10/2022 18:51   MR BRAIN WO CONTRAST Result Date:  09/10/2022 CLINICAL DATA:  Neuro deficit, acute, stroke suspected EXAM: MRI HEAD WITHOUT CONTRAST TECHNIQUE: Multiplanar, multiecho pulse sequences of the brain and surrounding structures were obtained without intravenous contrast. COMPARISON:  MR Brain 08/08/22 FINDINGS: Brain: Acute infarcts in the bilateral corona radiata and in the left parietal lobe, new compared to 08/08/2022. there is chronic infarct in the posterior left parietal lobe, posterior right parietal lobe, and in the right cerebellum. No hemorrhage. No extra-axial fluid collection. No hydrocephalus. There is sequela of severe chronic microvascular ischemic change. Vascular: Normal flow voids. Skull and upper cervical spine: Normal marrow signal. Sinuses/Orbits: No middle ear or mastoid effusion. Paranasal sinuses are clear. Bilateral lens replacement. Orbits are otherwise  unremarkable Other: None. IMPRESSION: Acute infarcts in the bilateral corona radiata and in the left parietal lobe, new compared to 08/08/2022. No hemorrhage. Electronically Signed   By: Lorenza Cambridge M.D.   On: 09/10/2022 12:00    CT Head Wo Contrast Result Date: 09/10/2022 CLINICAL DATA:  History follow-up.  Abnormal mental status. EXAM: CT HEAD WITHOUT CONTRAST TECHNIQUE: Contiguous axial images were obtained from the base of the skull through the vertex without intravenous contrast. RADIATION DOSE REDUCTION: This exam was performed according to the departmental dose-optimization program which includes automated exposure control, adjustment of the mA and/or kV according to patient size and/or use of iterative reconstruction technique. COMPARISON:  Brain MRI 08/08/2022 FINDINGS: Brain: No evidence of acute infarction, hemorrhage, hydrocephalus, extra-axial collection or mass lesion/mass effect. Chronic bifrontal and left parietal cortex infarcts. Small vessel infarcts in the deep gray nuclei, reference recent brain MRI showing areas of acuity. Vascular: No hyperdense vessel or unexpected calcification. Skull: Normal. Negative for fracture or focal lesion. Sinuses/Orbits: No acute finding. IMPRESSION: 1. No acute or reversible finding. 2. Chronic and subacute infarcts without noted progression from brain MRI 08/08/2022 Electronically Signed   By: Tiburcio Pea M.D.   On: 09/10/2022 08:39    12-lead ECG SR All prior EKG's in EPIC reviewed with no documented atrial fibrillation  Telemetry SR  Assessment and Plan:  1. Cryptogenic stroke The patient presents with cryptogenic stroke.  I spoke at length with the patient about monitoring for afib with either a 30 day event monitor or an implantable loop recorder.  Risks, benefits, and alteratives to implantable loop recorder were discussed with the patient today.   At this time, the patient is very clear in their decision to proceed with implantable loop  recorder.   Wound care was reviewed with the patient (keep incision clean and dry for 3 days).  Wound check follow up will be scheduled for the patient.  Please call with questions.   Kabao Leite Norberto Sorenson, PA-C 09/14/2022

## 2022-09-14 NOTE — Discharge Summary (Signed)
PATIENT DETAILS Name: [name] Age: 72 y.o. Sex: female Date of Birth: 01-06-1951 MRN: [id number]. Admitting Physician: [name], MD [id number], Aram Beecham, MD  Admit Date: 09/10/2022 Discharge date: 09/14/2022  Recommendations for Outpatient Follow-up:  Follow up with PCP in 1-2 weeks Please obtain CMP/CBC in one week Ensure follow-up with stroke clinic Aspirin Plavix x 3 weeks followed by Plavix alone  Admitted From:  Home  Disposition: CIR   Discharge Condition: good  CODE STATUS:   Code Status: Full Code   Diet recommendation:  Diet Order             Diet - low sodium heart healthy           Diet Carb Modified           Diet heart healthy/carb modified Room service appropriate? Yes; Fluid consistency: Thin  Diet effective now                    Brief Summary: Patient is a 72 y.o.  female with history of recent CVA, HTN, HLD, CKD stage IIIb-presenting with right-sided weakness-found to have acute CVA.   Significant events: 6/22>> admit to [facility] for right-sided weakness. 5/24-6/6>> hospitalization at CIR 5/20-5/24>> hospitalization for CVA-discharge to CIR   Significant studies: 5/21>> LDL:58 5/21>> A1c: 7.0 5/21>> echo: EF 60-65% 5/21>> carotid Doppler: Left ICA stenosis-40-59% 6/22>> MRI brain: Acute infarct bilateral corona radiator/left parietal lobe. 6/22>> MRI brain: No LVO/significant stenosis 6/23>> CTA head/neck: No emergent vascular finding-no rate limiting stenosis.   Significant microbiology data: None   Procedures: None   Consults: Neurology EP  Brief Hospital Course: Acute CVA-recurrent Slight right-sided weakness-drags leg when walking Workup as above Aspirin/Plavix x 3 weeks then Plavix alone Continue high intensity statin Discussed with EP service-Loop recorder will be placed today prior to discharge to CIR Subsequently will be discharged to CIR Followed closely by neurology-please ensure outpatient  follow-up with stroke clinic.  HTN BP on the higher side-initially some of permissive hypertension was allowed Subsequently started on antihypertensives-half of home dose-please continue follow-up at CIR and optimize medications accordingly.    HLD Statin   DM-2 CBG stable Insulin 70/30 20 units twice daily/SSI  DCIS-s/p lumpectomy/XRT Tamoxifen Resume outpatient follow-up with oncology   Depression Stable Wellbutrin/Lexapro   Asymptomatic bacteriuria No symptoms-monitor off antibiotics   CKD 3a At baseline   Obesity: Estimated body mass index is 33.52 kg/m as calculated from the following:   Height as of this encounter: 5\' 9"  (1.753 m).   Weight as of this encounter: 103 kg.    Discharge Diagnoses:  Principal Problem:   Acute CVA (cerebrovascular accident) (HCC) Active Problems:   Type 2 diabetes mellitus with hyperglycemia, with long-term current use of insulin   Diabetic peripheral neuropathy (HCC)   Hypertension, essential   Obstructive sleep apnea   Mixed hyperlipidemia   Morbid (severe) obesity due to excess calories (HCC)   CKD stage 3a, GFR 45-59 ml/min (HCC)   History of CVA (cerebrovascular accident)   Major depressive disorder, recurrent episode, moderate (HCC)   Discharge Instructions:  Activity:  As tolerated with Full fall precautions use walker/cane & assistance as needed   Discharge Instructions     Ambulatory referral to Neurology   Complete by: As directed    Follow up with Dr. Karel Jarvis at [facility] in 4 weeks. Pt is Dr. Rosalyn Gess pt. Thanks.   Diet - low sodium heart healthy   Complete by: As directed  Diet Carb Modified   Complete by: As directed    Discharge wound care:   Complete by: As directed    Wound care  Daily      Comments: Daily dressing changes bilateral feet: -Apply Xeroform to wounds -Apply Mepilex dressing overlying the Xeroform and ulcers.   Increase activity slowly   Complete by: As directed       Allergies as of  09/14/2022       Reactions   Elavil [amitriptyline] Other (See Comments)   Unknown reaction   Pristiq [desvenlafaxine] Other (See Comments)   Unknown allergy   Cipro [ciprofloxacin Hcl] Rash   Penicillins Rash   Sulfa Antibiotics Rash   Sulfamethoxazole Rash   Sulfur Rash        Medication List     STOP taking these medications    doxycycline 100 MG tablet Commonly known as: VIBRA-TABS   losartan 25 MG tablet Commonly known as: COZAAR       TAKE these medications    amLODipine 10 MG tablet Commonly known as: NORVASC Take 0.5 tablets (5 mg total) by mouth daily. What changed: how much to take   ascorbic acid 500 MG tablet Commonly known as: VITAMIN C Take 1 tablet (500 mg total) by mouth daily.   aspirin EC 81 MG tablet Take 1 tablet (81 mg total) by mouth daily for 21 days. Aspirin along with Plavix x 21 days-after 21 days stop aspirin and continue on Plavix.  Swallow whole. What changed: additional instructions   atorvastatin 80 MG tablet Commonly known as: LIPITOR Take 1 tablet (80 mg total) by mouth daily.   bacitracin 500 UNIT/GM ointment Apply 1 Application topically daily.   buPROPion 300 MG 24 hr tablet Commonly known as: WELLBUTRIN XL Take 1 tablet (300 mg total) by mouth daily.   carvedilol 3.125 MG tablet Commonly known as: COREG Take 1 tablet (3.125 mg total) by mouth 2 (two) times daily. What changed:  medication strength how much to take   clopidogrel 75 MG tablet Commonly known as: PLAVIX Take 1 tablet (75 mg total) by mouth daily.   diclofenac Sodium 1 % Gel Commonly known as: VOLTAREN Apply 4 g topically 4 (four) times daily.   escitalopram 20 MG tablet Commonly known as: LEXAPRO Take 1 tablet (20 mg total) by mouth daily.   insulin NPH-regular Human (70-30) 100 UNIT/ML injection Inject 20-24 Units into the skin See admin instructions. 24 units in the morning, 20 units in the evening   metFORMIN 500 MG tablet Commonly  known as: GLUCOPHAGE Take 1 tablet (500 mg total) by mouth 2 (two) times daily with a meal. What changed: Another medication with the same name was removed. Continue taking this medication, and follow the directions you see here.   tamoxifen 20 MG tablet Commonly known as: NOLVADEX TAKE ONE TABLET BY MOUTH EVERY MORNING What changed: when to take this   Vitamin D 125 MCG (5000 UT) Caps Take 5,000 Units by mouth every evening.   Zinc Sulfate 220 (50 Zn) MG Tabs Take 1 tablet (220 mg total) by mouth daily.               Discharge Care Instructions  (From admission, onward)           Start     Ordered   09/14/22 0000  Discharge wound care:       Comments: Wound care  Daily      Comments: Daily dressing changes bilateral feet: -Apply  Xeroform to wounds -Apply Mepilex dressing overlying the Xeroform and ulcers.   09/14/22 1009            Follow-up Information     Van Clines, MD. Schedule an appointment as soon as possible for a visit in 1 month(s).   Specialty: Neurology Contact information: [address] [place] [address] [phone]         Laurann Montana, MD. Schedule an appointment as soon as possible for a visit in 1 week(s).   Specialty: Family Medicine Contact information: [address], [address] [place] [address] [phone]                Allergies  Allergen Reactions   Elavil [Amitriptyline] Other (See Comments)    Unknown reaction   Pristiq [Desvenlafaxine] Other (See Comments)    Unknown allergy   Cipro [Ciprofloxacin Hcl] Rash   Penicillins Rash   Sulfa Antibiotics Rash   Sulfamethoxazole Rash   Sulfur Rash     Other Procedures/Studies: VAS [place] LOWER EXTREMITY VENOUS (DVT)  Result Date: 09/11/2022  Lower Venous DVT Study Patient Name:  KAMORI KITCHENS  Date of Exam:   09/11/2022 Medical Rec #: [id number]        Accession #:    [id number] Date of Birth: 10/01/1950        Patient Gender: F  Patient Age:   74 years Exam Location:  [facility] Procedure:      VAS [place] LOWER EXTREMITY VENOUS (DVT) Referring Phys: Lanae Boast --------------------------------------------------------------------------------  Indications: Edema, and stroke.  Limitations: Body habitus and patient unable to position legs. Comparison Study: Negative left LEV done 09/13/17 Performing Technologist: Sherren Kerns RVS  Examination Guidelines: A complete evaluation includes B-mode imaging, spectral Doppler, color Doppler, and power Doppler as needed of all accessible portions of each vessel. Bilateral testing is considered an integral part of a complete examination. Limited examinations for reoccurring indications may be performed as noted. The reflux portion of the exam is performed with the patient in reverse Trendelenburg.  +---------+---------------+---------+-----------+----------+-------------------+ RIGHT    CompressibilityPhasicitySpontaneityPropertiesThrombus Aging      +---------+---------------+---------+-----------+----------+-------------------+ CFV      Full           Yes      Yes                                      +---------+---------------+---------+-----------+----------+-------------------+ SFJ      Full                                                             +---------+---------------+---------+-----------+----------+-------------------+ FV Prox  Full                                                             +---------+---------------+---------+-----------+----------+-------------------+ FV Mid   Full           Yes      Yes                                      +---------+---------------+---------+-----------+----------+-------------------+  FV DistalFull                                                             +---------+---------------+---------+-----------+----------+-------------------+ PFV      Full                                                              +---------+---------------+---------+-----------+----------+-------------------+ POP                     Yes      Yes                  patent by color and                                                       Doppler             +---------+---------------+---------+-----------+----------+-------------------+ PTV                                                   Not well visualized +---------+---------------+---------+-----------+----------+-------------------+ PERO                                                  Not well visualized +---------+---------------+---------+-----------+----------+-------------------+   +---------+---------------+---------+-----------+----------+-------------------+ LEFT     CompressibilityPhasicitySpontaneityPropertiesThrombus Aging      +---------+---------------+---------+-----------+----------+-------------------+ CFV      Full           Yes      Yes                                      +---------+---------------+---------+-----------+----------+-------------------+ SFJ      Full                                                             +---------+---------------+---------+-----------+----------+-------------------+ FV Prox  Full                                                             +---------+---------------+---------+-----------+----------+-------------------+ FV Mid   Full                                                             +---------+---------------+---------+-----------+----------+-------------------+  FV DistalFull                                                             +---------+---------------+---------+-----------+----------+-------------------+ PFV      Full                                                             +---------+---------------+---------+-----------+----------+-------------------+ POP                                                   patent by  color and                                                       Doppler             +---------+---------------+---------+-----------+----------+-------------------+ PTV                                                   Not well visualized +---------+---------------+---------+-----------+----------+-------------------+ PERO                                                  Not well visualized +---------+---------------+---------+-----------+----------+-------------------+     Summary: BILATERAL: - No evidence of deep vein thrombosis seen in the lower extremities, bilaterally. -No evidence of popliteal cyst, bilaterally.   *See table(s) above for measurements and observations. Electronically signed by Waverly Ferrari MD on 09/11/2022 at 8:39:28 PM.    Final    CT ANGIO HEAD NECK W WO CM  Result Date: 09/11/2022 CLINICAL DATA:  Stroke, determine embolic source. EXAM: CT ANGIOGRAPHY HEAD AND NECK WITH AND WITHOUT CONTRAST TECHNIQUE: Multidetector CT imaging of the head and neck was performed using the standard protocol during bolus administration of intravenous contrast. Multiplanar CT image reconstructions and MIPs were obtained to evaluate the vascular anatomy. Carotid stenosis measurements (when applicable) are obtained utilizing NASCET criteria, using the distal internal carotid diameter as the denominator. RADIATION DOSE REDUCTION: This exam was performed according to the departmental dose-optimization program which includes automated exposure control, adjustment of the mA and/or kV according to patient size and/or use of iterative reconstruction technique. CONTRAST:  75mL OMNIPAQUE IOHEXOL 350 MG/ML SOLN COMPARISON:  Brain MRI from yesterday FINDINGS: CT HEAD FINDINGS Brain: Small acute infarcts are largely occult compared to brain MRI. Multiple chronic infarcts including along the bilateral frontal parietal cortex (more extensive on the left) and at the left corona radiata. Small  chronic cerebellar infarcts. No acute hemorrhage, hydrocephalus, or masslike finding. Vascular: No hyperdense vessel or unexpected calcification.  Skull: Normal. Negative for fracture or focal lesion. Sinuses/Orbits: No acute finding Review of the MIP images confirms the above findings CTA NECK FINDINGS Aortic arch: Atheromatous plaque with 3 vessel branching. Right carotid system: Atheromatous calcification at the carotid bifurcation. No flow reducing stenosis or ulceration. Left carotid system: Moderate mixed density plaque primarily at the bifurcation. No stenosis or ulceration. Vertebral arteries: No proximal subclavian stenosis although there is atherosclerosis on the right. The vertebral arteries are smoothly contoured and widely patent Skeleton: C5-6 ACDF with solid arthrodesis. Other neck: No acute finding Upper chest: No acute finding Review of the MIP images confirms the above findings CTA HEAD FINDINGS Anterior circulation: No branch occlusion, beading, or aneurysm. Atheromatous calcification along the carotid siphons. Hypoplastic right A1 segment Posterior circulation: The vertebral and basilar arteries are smoothly contoured and widely patent. No branch occlusion, beading, or aneurysm. Venous sinuses: Diffusely patent Anatomic variants: As above Review of the MIP images confirms the above findings IMPRESSION: No emergent vascular finding. There is atherosclerosis without flow reducing stenosis or ulceration of major arteries in the head and neck. Electronically Signed   By: Tiburcio Pea M.D.   On: 09/11/2022 10:22   MR ANGIO HEAD WO CONTRAST  Result Date: 09/10/2022 CLINICAL DATA:  Stroke/TIA, determine embolic source EXAM: MRA HEAD WITHOUT CONTRAST TECHNIQUE: Angiographic images of the Circle of Willis were acquired using MRA technique without intravenous contrast. COMPARISON:  MRA head 08/09/22 FINDINGS: Anterior circulation: No significant stenosis. No proximal occlusion. No aneurysm. Posterior  circulation: No significant stenosis. No proximal occlusion. No aneurysm. Anatomic variants: No Other: None. IMPRESSION: No intracranial large vessel occlusion or significant stenosis. Consider further evaluation with an MRA of the neck, this is not previously performed Electronically Signed   By: Lorenza Cambridge M.D.   On: 09/10/2022 18:51   MR BRAIN WO CONTRAST  Result Date: 09/10/2022 CLINICAL DATA:  Neuro deficit, acute, stroke suspected EXAM: MRI HEAD WITHOUT CONTRAST TECHNIQUE: Multiplanar, multiecho pulse sequences of the brain and surrounding structures were obtained without intravenous contrast. COMPARISON:  MR Brain 08/08/22 FINDINGS: Brain: Acute infarcts in the bilateral corona radiata and in the left parietal lobe, new compared to 08/08/2022. there is chronic infarct in the posterior left parietal lobe, posterior right parietal lobe, and in the right cerebellum. No hemorrhage. No extra-axial fluid collection. No hydrocephalus. There is sequela of severe chronic microvascular ischemic change. Vascular: Normal flow voids. Skull and upper cervical spine: Normal marrow signal. Sinuses/Orbits: No middle ear or mastoid effusion. Paranasal sinuses are clear. Bilateral lens replacement. Orbits are otherwise unremarkable Other: None. IMPRESSION: Acute infarcts in the bilateral corona radiata and in the left parietal lobe, new compared to 08/08/2022. No hemorrhage. Electronically Signed   By: Lorenza Cambridge M.D.   On: 09/10/2022 12:00   DG Chest 1 View  Result Date: 09/10/2022 CLINICAL DATA:  Right-sided weakness.  Right-sided chest pain. EXAM: CHEST  1 VIEW COMPARISON:  08/08/2022 FINDINGS: The lungs are clear without focal pneumonia, edema, pneumothorax or pleural effusion. The cardiopericardial silhouette is within normal limits for size. No acute bony abnormality. Telemetry leads overlie the chest. IMPRESSION: No active disease. Electronically Signed   By: Kennith Center M.D.   On: 09/10/2022 09:04   CT  Head Wo Contrast  Result Date: 09/10/2022 CLINICAL DATA:  History follow-up.  Abnormal mental status. EXAM: CT HEAD WITHOUT CONTRAST TECHNIQUE: Contiguous axial images were obtained from the base of the skull through the vertex without intravenous contrast. RADIATION DOSE REDUCTION: This exam was  performed according to the departmental dose-optimization program which includes automated exposure control, adjustment of the mA and/or kV according to patient size and/or use of iterative reconstruction technique. COMPARISON:  Brain MRI 08/08/2022 FINDINGS: Brain: No evidence of acute infarction, hemorrhage, hydrocephalus, extra-axial collection or mass lesion/mass effect. Chronic bifrontal and left parietal cortex infarcts. Small vessel infarcts in the deep gray nuclei, reference recent brain MRI showing areas of acuity. Vascular: No hyperdense vessel or unexpected calcification. Skull: Normal. Negative for fracture or focal lesion. Sinuses/Orbits: No acute finding. IMPRESSION: 1. No acute or reversible finding. 2. Chronic and subacute infarcts without noted progression from brain MRI 08/08/2022 Electronically Signed   By: Tiburcio Pea M.D.   On: 09/10/2022 08:39     TODAY-DAY OF DISCHARGE:  Subjective:   Charletta Cousin today has no headache,no chest abdominal pain,no new weakness tingling or numbness, feels much better wants to go home today.   Objective:   Blood pressure (!) 146/61, pulse 64, temperature 97.9 F (36.6 C), temperature source Oral, resp. rate 12, height 5\' 9"  (1.753 m), weight 103 kg, SpO2 98 %.  Intake/Output Summary (Last 24 hours) at 09/14/2022 1009 Last data filed at 09/14/2022 0901 [place] per 24 hour  Intake 600 ml  Output 2100 ml  Net -1500 ml   Filed Weights   09/10/22 0521  Weight: 103 kg    Exam: Awake Alert, Oriented *3, No new F.N deficits, Normal affect [place].AT,PERRAL Supple Neck,No JVD, No cervical lymphadenopathy appriciated.  Symmetrical Chest wall movement,  Good air movement bilaterally, CTAB RRR,No Gallops,Rubs or new Murmurs, No Parasternal Heave +ve B.Sounds, Abd Soft, Non tender, No organomegaly appriciated, No rebound -guarding or rigidity. No Cyanosis, Clubbing or edema, No new Rash or bruise   PERTINENT RADIOLOGIC STUDIES: No results found.   PERTINENT LAB RESULTS: CBC: No results for input(s): "WBC", "HGB", "HCT", "PLT" in the last 72 hours. CMET CMP     Component Value Date/Time   NA 136 09/12/2022 0439   K 3.5 09/12/2022 0439   CL 104 09/12/2022 0439   CO2 23 09/12/2022 0439   GLUCOSE 133 (H) 09/12/2022 0439   BUN 24 (H) 09/12/2022 0439   CREATININE 1.47 (H) 09/12/2022 0439   CREATININE 1.42 (H) 07/26/2022 1554   CALCIUM 8.7 (L) 09/12/2022 0439   PROT 6.9 09/10/2022 0527   ALBUMIN 2.8 (L) 09/12/2022 0439   AST 18 09/10/2022 0527   AST 18 01/08/2020 1051   ALT 18 09/10/2022 0527   ALT 20 01/08/2020 1051   ALKPHOS 42 09/10/2022 0527   BILITOT 0.7 09/10/2022 0527   BILITOT 0.3 01/08/2020 1051   EGFR 40 (L) 07/26/2022 1554   GFRNONAA 38 (L) 09/12/2022 0439   GFRNONAA 44 (L) 01/08/2020 1051   GFRNONAA 70 01/03/2017 1612    GFR Estimated Creatinine Clearance: 44.8 mL/min (A) (by C-G formula based on SCr of 1.47 mg/dL (H)). No results for input(s): "LIPASE", "AMYLASE" in the last 72 hours. No results for input(s): "CKTOTAL", "CKMB", "CKMBINDEX", "TROPONINI" in the last 72 hours. Invalid input(s): "POCBNP" No results for input(s): "DDIMER" in the last 72 hours. No results for input(s): "HGBA1C" in the last 72 hours. No results for input(s): "CHOL", "HDL", "LDLCALC", "TRIG", "CHOLHDL", "LDLDIRECT" in the last 72 hours. No results for input(s): "TSH", "T4TOTAL", "T3FREE", "THYROIDAB" in the last 72 hours.  Invalid input(s): "FREET3" No results for input(s): "VITAMINB12", "FOLATE", "FERRITIN", "TIBC", "IRON", "RETICCTPCT" in the last 72 hours. Coags: No results for input(s): "INR" in the last 72 hours.  Invalid  input(s): "PT" Microbiology: Recent Results (from the past 240 hour(s))  Urine Culture (for pregnant, neutropenic or urologic patients or patients with an indwelling urinary catheter)     Status: Abnormal   Collection Time: 09/10/22  7:04 AM   Specimen: Urine, Clean Catch  Result Value Ref Range Status   Specimen Description URINE, CLEAN CATCH  Final   Special Requests   Final    NONE Performed at [facility], [address], [place], [place] [address]    Culture >=100,000 COLONIES/mL ESCHERICHIA COLI (A)  Final   Report Status 09/13/2022 FINAL  Final   Organism ID, Bacteria ESCHERICHIA COLI (A)  Final      Susceptibility   Escherichia coli - MIC*    AMPICILLIN 8 SENSITIVE Sensitive     CEFAZOLIN <=4 SENSITIVE Sensitive     CEFEPIME <=0.12 SENSITIVE Sensitive     CEFTRIAXONE <=0.25 SENSITIVE Sensitive     CIPROFLOXACIN <=0.25 SENSITIVE Sensitive     GENTAMICIN <=1 SENSITIVE Sensitive     IMIPENEM <=0.25 SENSITIVE Sensitive     NITROFURANTOIN <=16 SENSITIVE Sensitive     TRIMETH/SULFA <=20 SENSITIVE Sensitive     AMPICILLIN/SULBACTAM 4 SENSITIVE Sensitive     PIP/TAZO <=4 SENSITIVE Sensitive     * >=100,000 COLONIES/mL ESCHERICHIA COLI    FURTHER DISCHARGE INSTRUCTIONS:  Get Medicines reviewed and adjusted: Please take all your medications with you for your next visit with your Primary MD  Laboratory/radiological data: Please request your Primary MD to go over all hospital tests and procedure/radiological results at the follow up, please ask your Primary MD to get all Hospital records sent to his/her office.  In some cases, they will be blood work, cultures and biopsy results pending at the time of your discharge. Please request that your primary care M.D. goes through all the records of your hospital data and follows up on these results.  Also Note the following: If you experience worsening of your admission symptoms, develop shortness of breath, life threatening  emergency, suicidal or homicidal thoughts you must seek medical attention immediately by calling 911 or calling your MD immediately  if symptoms less severe.  You must read complete instructions/literature along with all the possible adverse reactions/side effects for all the Medicines you take and that have been prescribed to you. Take any new Medicines after you have completely understood and accpet all the possible adverse reactions/side effects.   Do not drive when taking Pain medications or sleeping medications (Benzodaizepines)  Do not take more than prescribed Pain, Sleep and Anxiety Medications. It is not advisable to combine anxiety,sleep and pain medications without talking with your primary care practitioner  Special Instructions: If you have smoked or chewed Tobacco  in the last 2 yrs please stop smoking, stop any regular Alcohol  and or any Recreational drug use.  Wear Seat belts while driving.  Please note: You were cared for by a hospitalist during your hospital stay. Once you are discharged, your primary care physician will handle any further medical issues. Please note that NO REFILLS for any discharge medications will be authorized once you are discharged, as it is imperative that you return to your primary care physician (or establish a relationship with a primary care physician if you do not have one) for your post hospital discharge needs so that they can reassess your need for medications and monitor your lab values.  Total Time spent coordinating discharge including counseling, education and face to face time equals  greater than 30 minutes.  SignedJeoffrey Massed 09/14/2022 10:09 AM

## 2022-09-14 NOTE — Care Management Important Message (Signed)
Important Message  Patient Details  Name: [name] MRN: [id number] Date of Birth: 14-Jan-1951   Medicare Important Message Given:  Yes     Rylan Kaufmann 09/14/2022, 4:11 PM

## 2022-09-14 NOTE — Progress Notes (Signed)
Fanny Dance, MD  Physician Physical Medicine and Rehabilitation   PMR Pre-admission    Signed   Date of Service: 09/13/2022  8:25 AM  Related encounter: ED to Hosp-Admission (Current) from 09/10/2022 in [facility]   Signed      Show:Clear all [x] Written[x] Templated[x] Copied  Added by: [x] Beckie Salts Tye Maryland, RN  [] Hover for details PMR Admission Coordinator Pre-Admission Assessment   Patient: [name] is an 72 y.o., female MRN: [id number] DOB: Aug 15, 1950 Height: 5\' 9"  (175.3 cm) Weight: 103 kg                                                                                                                                      Insurance Information HMO:     PPO: yes     PCP:      IPA:      80/20:      OTHER:  PRIMARY: [employer]      Policy#: [id number]      Subscriber: pt CM Name: Arlyss Queen     Phone#: [phone]     Fax#: [phone]/EPIC access Pre-Cert#: [id number] approved for 7 days      Employer:  Benefits:  Phone #: [phone]     Name: 6/24 Eff. Date: 03/21/22     Deduct: none      Out of Pocket Max: $3200      Life Max: none CIR: $295 co pay per day days 1 until 6      SNF: no  copay per day days 1 until 20; $203 co pay per day days 21 until 100 Outpatient: $15 per visit     Co-Pay: visit per medical neccesity Home Health: 100%      Co-Pay: vitis per medical neccesity DME: 80%     Co-Pay: 20% Providers: in network  SECONDARY: none   Financial Counselor:       Phone#:    The Data processing manager" for patients in Inpatient Rehabilitation Facilities with attached "Privacy Act Statement-Health Care Records" was provided and verbally reviewed with: Patient   Emergency Contact Information Contact Information       Name Relation Home Work Mobile    Seboyeta Sister     [phone]         Current Medical History  Patient Admitting Diagnosis: CVA   History of Present Illness:  72 year old right-handed female with a history of 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   Presented on 09/10/22 with increased weakness noted the day prior. When she tried to get out of bed, she slid to the ground and EMS was contacted. She was to have outpatient cardiac monitoring at discharge but  had not yet reviewed. Imaging revealed new left BG and Left parietal small/punctate infarcts, concerning for cardio embolic source. Continue on ASA and Plavix for 3 weeks followed by ASA alone. Plan for LOOP placement before discharge. Home meds of amlodipine, Coreg and losartan placed on hold. To allow for permissive HTN and to avoid hypotension and dehydration. Home Lipitor resumed. Home meds of metformin to monitor CBGS and SSI. Lexapro continued and breast cancer on Tamoxifen.   Complete NIHSS TOTAL: 3 Glasgow Coma Scale Score: 15   Patient's medical record from [facility] has been reviewed by the rehabilitation admission coordinator and physician.   Past Medical History      Past Medical History:  Diagnosis Date   AKI (acute kidney injury) (HCC) 08/09/2022   Breast cancer (HCC) 12/2019    left breast DCIS   CKD (chronic kidney disease) 06/07/2016    Stage 2, GFR 60-89 ml/min   Diabetic polyneuropathy 06/07/2016   Dyslipidemia     Endometrial adenocarcinoma 2012   Fever blister     Hyperlipidemia     Hypertension, essential 06/07/2016   Major depressive disorder     Mixed dyslipidemia 06/07/2016   MRSA infection 10/2019    left foot wound   Obstructive sleep apnea 06/07/2016    does not use CPAP   Peripheral neuropathy     Retinopathy     Right rib fracture 08/09/2022   Severe obesity     Skin ulcer of left foot with  fat layer exposed 11/02/2016   Stroke      Asymptomatic, discovered via neuroimaging; small infarct in left parietal lobe; also concern for small b/l frontal infarcts   Tachycardia 07/19/2017   Traumatic rhabdomyolysis (HCC) 08/09/2022   Type 2 diabetes mellitus with hyperglycemia, with long-term current use of insulin 06/07/2016   Unilateral primary osteoarthritis, right knee 04/16/2019   UTI (urinary tract infection) 08/09/2022    Has the patient had major surgery during 100 days prior to admission? No   Family History  family history includes Alzheimer's disease in her father and mother; Bladder Cancer in her father; Breast cancer (age of onset: 61) in her paternal aunt; CAD in her father; Diverticulitis in her sister; Heart attack in her father; Hypertension in her mother and sister; Obesity in her sister; Prostate cancer in her paternal uncle; Voice disorder in her brother.   Current Medications    Current Facility-Administered Medications:    acetaminophen (TYLENOL) tablet 650 mg, 650 mg, Oral, Q4H PRN, 650 mg at 09/13/22 1212 **OR** acetaminophen (TYLENOL) 160 MG/5ML solution 650 mg, 650 mg, Per Tube, Q4H PRN **OR** acetaminophen (TYLENOL) suppository 650 mg, 650 mg, Rectal, Q4H PRN, [name], MD   amLODipine (NORVASC) tablet 5 mg, 5 mg, Oral, Daily, Ghimire, Shanker M, MD, 5 mg at 09/14/22 4098   aspirin EC tablet 81 mg, 81 mg, Oral, Daily, Pham, Minh Q, RPH-CPP, 81 mg at 09/14/22 0913   atorvastatin (LIPITOR) tablet 80 mg, 80 mg, Oral, Daily, [name], MD, 80 mg at 09/14/22 0912   buPROPion (WELLBUTRIN XL) 24 hr tablet 300 mg, 300 mg, Oral, Daily, [name], MD, 300 mg at 09/14/22 0912   carvedilol (COREG) tablet 3.125 mg, 3.125 mg, Oral, BID, Ghimire, Shanker M, MD, 3.125 mg at 09/14/22 0912   clopidogrel (PLAVIX) tablet 75 mg, 75 mg, Oral, Daily, [name], MD, 75 mg at 09/14/22 0912   diclofenac Sodium (VOLTAREN) 1 % topical gel 4 g, 4 g,  Topical, QID, Ghimire, Werner Lean, MD, 4 g at 09/14/22 0914   enoxaparin (LOVENOX) injection 40 mg, 40 mg, Subcutaneous, Q24H, [name], MD, 40 mg at 09/13/22 2054   escitalopram (LEXAPRO) tablet 20 mg, 20 mg, Oral, Daily, [name], MD, 20 mg at 09/14/22 0912   insulin aspart (novoLOG) injection 0-9 Units, 0-9 Units, Subcutaneous, TID WC, [name], MD, 2 Units at 09/14/22 0913   insulin aspart protamine- aspart (NOVOLOG MIX 70/30) injection 20 Units, 20 Units, Subcutaneous, BID WC, [name], MD, 20 Units at 09/14/22 0913   melatonin tablet 3 mg, 3 mg, Oral, QHS PRN, [name], MD   Oral care mouth rinse, 15 mL, Mouth Rinse, PRN, Ghimire, Werner Lean, MD   tamoxifen (NOLVADEX) tablet 20 mg, 20 mg, Oral, Daily, [name], MD, 20 mg at 09/14/22 0913   Patients Current Diet:  Diet Order                  Diet - low sodium heart healthy             Diet Carb Modified             Diet heart healthy/carb modified Room service appropriate? Yes; Fluid consistency: Thin  Diet effective now                       Precautions / Restrictions Precautions Precautions: Fall Restrictions Weight Bearing Restrictions: No    Has the patient had 2 or more falls or a fall with injury in the past year?Yes   Prior Activity Level Limited Community (1-2x/wk): Mod I to supervision level with RW since d/c'd from CIR 08/25/22   Prior Functional Level Prior Function Prior Level of Function : Independent/Modified Independent, Driving Mobility Comments: Mod I with rollator ADLs Comments: tub bench for shower, pt was independent with all ADL except donning ted hose which family would assist with. family also assisted with IADL   Self Care: Did the patient need help bathing, dressing, using the toilet or eating?  Independent   Indoor Mobility: Did the patient need assistance with walking from room to room (with or without device)? Independent    Stairs: Did the patient need assistance with internal or external stairs (with or without device)? Independent   Functional Cognition: Did the patient need help planning regular tasks such as shopping or remembering to take medications? Needed some help   Patient Information Are you of Hispanic, Latino/a,or Spanish origin?: A. No, not of Hispanic, Latino/a, or Spanish origin What is your race?: A. White Do you need or want an interpreter to communicate with a doctor or health care staff?: 0. No   Patient's Response To:  Health Literacy and Transportation Is the patient able to respond to health literacy and transportation needs?: Yes Health Literacy - How often do you need to have someone help you when you read instructions, pamphlets, or other written material from your doctor or pharmacy?: Never In the past 12 months, has lack of transportation kept you from medical appointments or from getting medications?: No In the past 12 months, has lack of transportation kept you from meetings, work, or from getting things needed for daily living?: No   Home Assistive Devices / Equipment Home Equipment: Rollator (4 wheels), Tub bench, BSC/3in1   Prior Device Use: Indicate devices/aids used by the patient prior to current illness, exacerbation or injury? Walker   Current Functional Level Cognition  Arousal/Alertness: Awake/alert Overall Cognitive Status: Within Functional Limits for tasks assessed Orientation Level: Oriented X4 General Comments: slow processing and depressed spirits but able to follow all commands and expressed "I'm proud of all the stuff I did today.", perking up her spirits Attention: Selective Sustained Attention: Appears intact Selective Attention: Appears intact Memory: Appears intact Awareness: Appears intact    Extremity Assessment (includes Sensation/Coordination)   Upper Extremity Assessment: Generalized weakness RUE Deficits / Details: grossly 4/5 vs LUE 5/5;  decreased fine motor coordination, decreased rapid alternating movement, pt reports sensationis intact RUE Sensation: WNL RUE Coordination: decreased fine motor  Lower Extremity Assessment: RLE deficits/detail RLE Deficits / Details: MMT scores of 3 hip flexion, 4- knee extension, 3+ ankle dorsiflexion; hx of peripheral neuropathy resulting in no sensation below knees bil but sensation at thighs intact bil; [place] incoordination RLE Sensation: history of peripheral neuropathy RLE Coordination: decreased Strassner motor     ADLs   Overall ADL's : Needs assistance/impaired Eating/Feeding: Set up, Sitting Grooming: Set up, Sitting, Standing (patient alternating between sitting and standing at sink  PAtient standing fro approx 1 min before seated rest break) Upper Body Bathing: Set up, Sitting Lower Body Bathing: Minimal assistance, Sit to/from stand Upper Body Dressing : Set up, Sitting Lower Body Dressing: Moderate assistance, Sit to/from Pharmacist, hospital Details (indicate cue type and reason): pt taking lateral side steps along EOB with minA, deferred further mobility secondary to fatigue Toileting- Clothing Manipulation and Hygiene: Moderate assistance, Sit to/from stand Toileting - Clothing Manipulation Details (indicate cue type and reason): assist due to heavy reliance on BUE support in standing Functional mobility during ADLs: Minimal assistance, Rolling walker (2 wheels), Cueing for safety General ADL Comments: pt limited by decreased endurance, decreased activity tolerance, instability and LE weakness     Mobility   Overal bed mobility: Needs Assistance Bed Mobility: Supine to Sit Supine to sit: Min assist Sit to supine: Mod assist General bed mobility comments: pt able to bring LEs off EOB, minA for trunk elevation, tactile cues to bring L UE across to R hand rail to push up, assist to scoot forward so feet on the floor     Transfers   Overall transfer level: Needs  assistance Equipment used: Rolling walker (2 wheels) Transfers: Sit to/from Stand Sit to Stand: Mod assist General transfer comment: modA to power up, easier from higher surface height     Ambulation / Gait / Stairs / Wheelchair Mobility   Ambulation/Gait Ambulation/Gait assistance: Editor, commissioning (Feet): 120 Feet Assistive device: Rolling walker (2 wheels) Gait Pattern/deviations: Decreased step length - right, Step-through pattern, Decreased dorsiflexion - right, Trunk flexed, Decreased stride length General Gait Details: pt with improved ability to consistently clear R foot  this date for majority of time, onset of fatigue came s/p 69' requiring more verbal cues to incresaed R LE step height and to make the knee straight when advancing L LE to minimize R knee instability/buckling at onset of fatigue Gait velocity: dec Gait velocity interpretation: <1.31 ft/sec, indicative of household ambulator     Posture / Balance Dynamic Sitting Balance Sitting balance - Comments: pt able to maintain EOB balance this date without use of UEs while feet were supported on the floor Balance Overall balance assessment: Needs assistance Sitting-balance support: Single extremity supported, Feet supported Sitting balance-Leahy Scale: Fair Sitting balance - Comments: pt able to maintain EOB balance this date without use of UEs while feet were supported on the floor Postural control: Posterior  lean Standing balance support: Single extremity supported, During functional activity Standing balance-Leahy Scale: Fair Standing balance comment: pt stood  at RW and performed pericare in mini squat position and support of R UE on RW while using L UE to cleanse. Pt with noted onset of fatigue s/p 1 min requiring minA to prevent falling forward, pt stood total of 4 3 minutes.     Special needs/care consideration Loop Placement before d/c to CIR Podiatry consulted 6/24 for wound follow up see consult; feet  bilaterally. Xeroform with Meplix bandages to all ulcers bilaterally and change daily         Previous Home Environment  Living Arrangements:  (lives with sister and brother in Social worker)  Lives With: Family Available Help at Discharge: Family, Available 24 hours/day Type of Home: House Home Layout: One level Home Access: Stairs to enter Entrance Stairs-Rails: Right, Left Entrance Stairs-Number of Steps: 5 Bathroom Shower/Tub: Engineer, manufacturing systems:  (bedside commode over top of it during the day) Bathroom Accessibility: Yes How Accessible: Accessible via walker Home Care Services: Other (Comment) Additional Comments: [facility] PT and OT   Discharge Living Setting Plans for Discharge Living Setting: Lives with (comment) (sister and brother in law) Type of Home at Discharge: House Discharge Home Layout: One level Discharge Home Access: Stairs to enter Entrance Stairs-Rails: Right, Left Entrance Stairs-Number of Steps: 5 Discharge Bathroom Shower/Tub: Tub/shower unit Discharge Bathroom Toilet: Standard Discharge Bathroom Accessibility: Yes How Accessible: Accessible via walker Does the patient have any problems obtaining your medications?: No   Social/Family/Support Systems Contact Information: sister , [name] Anticipated Caregiver: sister Anticipated Industrial/product designer Information: see contacts Ability/Limitations of Caregiver: no limitations Caregiver Availability: 24/7 Discharge Plan Discussed with Primary Caregiver: Yes Is Caregiver In Agreement with Plan?: Yes Does Caregiver/Family have Issues with Lodging/Transportation while Pt is in Rehab?: No   Goals Patient/Family Goal for Rehab: superivsion with PT and OT Expected length of stay: ELOS 7 to 10 days Pt/Family Agrees to Admission and willing to participate: Yes Program Orientation Provided & Reviewed with Pt/Caregiver Including Roles  & Responsibilities: Yes   Decrease burden of Care through IP rehab  admission: n/a   Possible need for SNF placement upon discharge:not anticipated   Patient Condition: This patient's condition remains as documented in the consult dated 09/12/22, in which the Rehabilitation Physician determined and documented that the patient's condition is appropriate for intensive rehabilitative care in an inpatient rehabilitation facility. Will admit to inpatient rehab today.   Preadmission Screen Completed By:  Clois Dupes, RN MSN 09/14/2022 10:40 AM ______________________________________________________________________   Discussed status with Dr. Natale Lay on 09/14/22 at 1041 and received approval for admission today.   Admission Coordinator:  Clois Dupes, RN MSN time [id number] Date 09/14/22            Revision History

## 2022-09-14 NOTE — Progress Notes (Signed)
Fanny Dance, MD  Physician Physical Medicine and Rehabilitation   Consult Note    Addendum   Date of Service: 09/12/2022  1:27 PM  Related encounter: ED to Hosp-Admission (Current) from 09/10/2022 in [facility]   Expand All Collapse All  Show:Clear all [x] Written[x] Templated[] Copied  Added by: [x] Fanny Dance, MD  [] Hover for details          Physical Medicine and Rehabilitation Consult Reason for Consult: Rehab Referring Physician: Dr. Dewayne Shorter     HPI: [name] is a 72 y.o. female with past medical history of prior CVA, diabetes mellitus with polyneuropathy, CKD 3A, hypertension, hyperlipidemia, breast cancer, obesity, depression, OSA who was admitted to the hospital on 6/22 with right-sided weakness.  Patient had a stroke about a month ago and completed rehabilitation at [facility] with discharge on 08/25/2022.  CT head with no acute changes, prior infarcts noted.  MRI of the brain done on 6/22 showed new acute infarcts in the bilateral corona radiata and left parietal lobe.  Neurology is feels like there may be a cardioembolic source.  CTA showed atherosclerosis without flow reducing stenosis or ulceration of major arteries in the head and neck.  Loop recorder is recommended to discharge.  She is on aspirin and Plavix for 3 weeks then Plavix alone.  Patient reports her right shoulder has been painful with activities.  She has chronic numbness below her knees due to polyneuropathy.  She was evaluated by PT and OT and found to have functional deficits, felt to be a good candidate for CIR.   Lives in a two-story home but stays on the first floor.  Sister can assist after discharge 24/7.  Substandard     Review of Systems  Constitutional:  Negative for chills and fever.  HENT:  Negative for congestion.   Eyes:  Negative for double vision.  Respiratory:  Negative for shortness of breath.   Cardiovascular:  Negative for chest pain.   Gastrointestinal:  Positive for constipation. Negative for abdominal pain, diarrhea, heartburn, nausea and vomiting.  Genitourinary: Negative.   Musculoskeletal:  Positive for joint pain.  Skin:  Negative for rash.  Neurological:  Positive for speech change and weakness. Negative for dizziness, tingling, sensory change and headaches.  Psychiatric/Behavioral:  Positive for depression.         Past Medical History:  Diagnosis Date   AKI (acute kidney injury) (HCC) 08/09/2022   Breast cancer (HCC) 12/2019    left breast DCIS   CKD (chronic kidney disease) 06/07/2016    Stage 2, GFR 60-89 ml/min   Diabetic polyneuropathy 06/07/2016   Dyslipidemia     Endometrial adenocarcinoma 2012   Fever blister     Hyperlipidemia     Hypertension, essential 06/07/2016   Major depressive disorder     Mixed dyslipidemia 06/07/2016   MRSA infection 10/2019    left foot wound   Obstructive sleep apnea 06/07/2016    does not use CPAP   Peripheral neuropathy     Retinopathy     Right rib fracture 08/09/2022   Severe obesity     Skin ulcer of left foot with fat layer exposed 11/02/2016   Stroke      Asymptomatic, discovered via neuroimaging; small infarct in left parietal lobe; also concern for small b/l frontal infarcts   Tachycardia 07/19/2017   Traumatic rhabdomyolysis (HCC) 08/09/2022   Type 2 diabetes mellitus with hyperglycemia, with long-term current use of insulin 06/07/2016   Unilateral primary  osteoarthritis, right knee 04/16/2019   UTI (urinary tract infection) 08/09/2022         Past Surgical History:  Procedure Laterality Date   ADRENALECTOMY       BREAST LUMPECTOMY WITH RADIOACTIVE SEED LOCALIZATION Left 02/25/2020    Procedure: LEFT BREAST LUMPECTOMY WITH RADIOACTIVE SEED LOCALIZATION;  Surgeon: Abigail Miyamoto, MD;  Location: [facility];  Service: General;  Laterality: Left;   BUNIONECTOMY       CARDIAC CATHETERIZATION       CERVICAL SPINE SURGERY        CHOLECYSTECTOMY       DILATION AND CURETTAGE OF UTERUS       LAPAROSCOPIC SALPINGOOPHERECTOMY       THORACOTOMY       TONSILLECTOMY             Family History  Problem Relation Age of Onset   Hypertension Mother     Alzheimer's disease Mother     Heart attack Father     CAD Father     Alzheimer's disease Father     Bladder Cancer Father          dx late 56s   Diverticulitis Sister     Obesity Sister     Hypertension Sister     Voice disorder Brother     Breast cancer Paternal Aunt 63   Prostate cancer Paternal Uncle          dx mid 10s    Social History:  reports that she has never smoked. She has never used smokeless tobacco. She reports that she does not drink alcohol and does not use drugs. Allergies:       Allergies  Allergen Reactions   Elavil [Amitriptyline] Other (See Comments)      Unknown reaction   Pristiq [Desvenlafaxine] Other (See Comments)      Unknown allergy   Cipro [Ciprofloxacin Hcl] Rash   Penicillins Rash   Sulfa Antibiotics Rash   Sulfamethoxazole Rash   Sulfur Rash          Medications Prior to Admission  Medication Sig Dispense Refill   amLODipine (NORVASC) 10 MG tablet Take 1 tablet (10 mg total) by mouth daily. 30 tablet 0   ascorbic acid (VITAMIN C) 500 MG tablet Take 1 tablet (500 mg total) by mouth daily. 100 tablet 0   aspirin EC 81 MG tablet Take 1 tablet (81 mg total) by mouth daily. Swallow whole.       atorvastatin (LIPITOR) 80 MG tablet Take 1 tablet (80 mg total) by mouth daily. 30 tablet 0   bacitracin 500 UNIT/GM ointment Apply 1 Application topically daily.       buPROPion (WELLBUTRIN XL) 300 MG 24 hr tablet Take 1 tablet (300 mg total) by mouth daily. 30 tablet 0   carvedilol (COREG) 6.25 MG tablet Take 1 tablet (6.25 mg total) by mouth 2 (two) times daily. 60 tablet 0   Cholecalciferol (VITAMIN D) 125 MCG (5000 UT) CAPS Take 5,000 Units by mouth every evening.       escitalopram (LEXAPRO) 20 MG tablet Take 1 tablet (20 mg  total) by mouth daily. 30 tablet 0   insulin NPH-regular Human (NOVOLIN 70/30) (70-30) 100 UNIT/ML injection Inject 20-24 Units into the skin See admin instructions. 24 units in the morning, 20 units in the evening       losartan (COZAAR) 25 MG tablet Take 1 tablet (25 mg total) by mouth every morning. 30 tablet 0  metFORMIN (GLUCOPHAGE) 500 MG tablet Take 1 tablet (500 mg total) by mouth 2 (two) times daily with a meal. 60 tablet 0   tamoxifen (NOLVADEX) 20 MG tablet TAKE ONE TABLET BY MOUTH EVERY MORNING (Patient taking differently: Take 20 mg by mouth daily.) 90 tablet 0   Zinc Sulfate 220 (50 Zn) MG TABS Take 1 tablet (220 mg total) by mouth daily. 100 tablet 0   clopidogrel (PLAVIX) 75 MG tablet Take 1 tablet (75 mg total) by mouth daily. (Patient not taking: Reported on 09/11/2022) 5 tablet 0   doxycycline (VIBRA-TABS) 100 MG tablet Take 1 tablet (100 mg total) by mouth every 12 (twelve) hours. (Patient not taking: Reported on 09/11/2022) 6 tablet 0   metFORMIN (GLUCOPHAGE) 1000 MG tablet Take 1 tablet (1,000 mg total) by mouth 2 (two) times daily with a meal. (Patient not taking: Reported on 09/11/2022) 60 tablet 0      Home: Home Living Family/patient expects to be discharged to:: Private residence Living Arrangements: Other relatives (sister) Available Help at Discharge: Family, Available 24 hours/day Type of Home: House Home Access: Stairs to enter [employer] of Steps: 5 Entrance Stairs-Rails: Right, Left Home Layout: One level Bathroom Shower/Tub: Engineer, manufacturing systems:  (bedside commode over top of it during the day) Bathroom Accessibility: Yes Home Equipment: Rollator (4 wheels), Tub bench, BSC/3in1  Lives With: Other (Comment) (sister - caregiver)  Functional History: Prior Function Prior Level of Function : Independent/Modified Independent, Driving Mobility Comments: Mod I with rollator ADLs Comments: tub bench for shower, pt was independent with all  ADL except donning ted hose which family would assist with. family also assisted with IADL Functional Status:  Mobility: Bed Mobility Overal bed mobility: Needs Assistance Bed Mobility: Supine to Sit Supine to sit: Min assist Sit to supine: Mod assist General bed mobility comments: pt able to bring LEs off EOB, minA for trunk elevation, tactile cues to bring L UE across to R hand rail to push up Transfers Overall transfer level: Needs assistance Equipment used: Rolling walker (2 wheels) Transfers: Sit to/from Stand Sit to Stand: Mod assist General transfer comment: initially modA to power up from bed height, once bed elevated pt minA to power up, increased time Ambulation/Gait Ambulation/Gait assistance: Min assist Gait Distance (Feet): 80 Feet Assistive device: Rolling walker (2 wheels) Gait Pattern/deviations: Decreased step length - right, Step-through pattern, Decreased dorsiflexion - right, Trunk flexed, Decreased stride length General Gait Details: pt with increased trunk flexion, verbal cues to hold head up and look forward, pt with short step length bilaterally however pt scuffing R foot freq due to decreased step height. Constant verbal cues given to increase R foot step height, more difficulty with increased distance as fatigue set int Gait velocity: reduced Gait velocity interpretation: <1.31 ft/sec, indicative of household ambulator   ADL: ADL Overall ADL's : Needs assistance/impaired Eating/Feeding: Set up, Sitting Grooming: Set up, Sitting Upper Body Bathing: Set up, Sitting Lower Body Bathing: Minimal assistance, Sit to/from stand Upper Body Dressing : Set up, Sitting Lower Body Dressing: Moderate assistance, Sit to/from Pharmacist, hospital Details (indicate cue type and reason): pt taking lateral side steps along EOB with minA, deferred further mobility secondary to fatigue Toileting- Clothing Manipulation and Hygiene: Moderate assistance, Sit to/from  stand Toileting - Clothing Manipulation Details (indicate cue type and reason): assist due to heavy reliance on BUE support in standing Functional mobility during ADLs: Minimal assistance, +2 for safety/equipment, Rolling walker (2 wheels) General ADL Comments: pt limited  by decreased endurance, decreased activity tolerance, instability and LE weakness   Cognition: Cognition Overall Cognitive Status: Within Functional Limits for tasks assessed Arousal/Alertness: Awake/alert Orientation Level: Oriented X4 Attention: Selective Sustained Attention: Appears intact Selective Attention: Appears intact Memory: Appears intact Awareness: Appears intact Cognition Arousal/Alertness: Awake/alert Behavior During Therapy: WFL for tasks assessed/performed Overall Cognitive Status: Within Functional Limits for tasks assessed General Comments: Slow processing, however suspect pt moves slow at baseline   Blood pressure 136/74, pulse 72, temperature 98.9 F (37.2 C), temperature source Oral, resp. rate 13, height 5\' 9"  (1.753 m), weight 103 kg, SpO2 94 %. Physical Exam     General:  No apparent distress HEENT: Head is normocephalic, atraumatic, PERRLA, EOMI, sclera anicteric, oral mucosa pink and moist Neck: Supple without JVD or lymphadenopathy Heart: Reg rate and rhythm. No murmurs rubs or gallops Chest: CTA bilaterally without wheezes, rales, or rhonchi; no distress Abdomen: Soft, non-tender, non-distended, bowel sounds positive. Extremities: No clubbing, cyanosis, or edema. Pulses are 2+ Psych: Pt's affect is flat Skin: Clean and intact without signs of breakdown Neuro: Alert and oriented to person, place, year, month with cues, speech appears to be a little slowed and delayed responses noted.  Occasional word finding difficulties.  Cranial nerves II through XII intact. Able to name and repeat. Recalls 1/3 words 5 min later.  Decreased sensation to light touch below her knees, intact in  bilateral upper extremities and face Strength bilateral lower extremities 3/5 hip flexion, distally 4 -/5 Bilateral upper extremities 4+ out of 5 Slight pronator drift on the right Musculoskeletal:  No abnormal muscle tone, normal muscle bulk Mild right shoulder tenderness, pain with abduction passive, Neer's positive     Lab Results Last 24 Hours       Results for orders placed or performed during the hospital encounter of 09/10/22 (from the past 24 hour(s))  Glucose, capillary     Status: Abnormal    Collection Time: 09/11/22  4:46 PM  Result Value Ref Range    Glucose-Capillary 129 (H) 70 - 99 mg/dL  Glucose, capillary     Status: Abnormal    Collection Time: 09/11/22  9:15 PM  Result Value Ref Range    Glucose-Capillary 170 (H) 70 - 99 mg/dL  Renal function panel     Status: Abnormal    Collection Time: 09/12/22  4:39 AM  Result Value Ref Range    Sodium 136 135 - 145 mmol/L    Potassium 3.5 3.5 - 5.1 mmol/L    Chloride 104 98 - 111 mmol/L    CO2 23 22 - 32 mmol/L    Glucose, Bld 133 (H) 70 - 99 mg/dL    BUN 24 (H) 8 - 23 mg/dL    Creatinine, Ser 4.09 (H) 0.44 - 1.00 mg/dL    Calcium 8.7 (L) 8.9 - 10.3 mg/dL    Phosphorus 3.9 2.5 - 4.6 mg/dL    Albumin 2.8 (L) 3.5 - 5.0 g/dL    GFR, Estimated 38 (L) >60 mL/min    Anion gap 9 5 - 15  Glucose, capillary     Status: Abnormal    Collection Time: 09/12/22  8:23 AM  Result Value Ref Range    Glucose-Capillary 237 (H) 70 - 99 mg/dL  Glucose, capillary     Status: Abnormal    Collection Time: 09/12/22 12:39 PM  Result Value Ref Range    Glucose-Capillary 188 (H) 70 - 99 mg/dL       Imaging Results (Last 48  hours)  VAS [place] LOWER EXTREMITY VENOUS (DVT)   Result Date: 09/11/2022  Lower Venous DVT Study Patient Name:  AMILIAH CAMPISI  Date of Exam:   09/11/2022 Medical Rec #: [id number]        Accession #:    [id number] Date of Birth: [birth year]        Patient Gender: F Patient Age:   72 years Exam Location:  [facility] [place]  Procedure:      VAS [place] LOWER EXTREMITY VENOUS (DVT) Referring Phys: Lanae Boast --------------------------------------------------------------------------------  Indications: Edema, and stroke.  Limitations: Body habitus and patient unable to position legs. Comparison Study: Negative left LEV done 09/13/17 Performing Technologist: Sherren Kerns RVS  Examination Guidelines: A complete evaluation includes B-mode imaging, spectral Doppler, color Doppler, and power Doppler as needed of all accessible portions of each vessel. Bilateral testing is considered an integral part of a complete examination. Limited examinations for reoccurring indications may be performed as noted. The reflux portion of the exam is performed with the patient in reverse Trendelenburg.  +---------+---------------+---------+-----------+----------+-------------------+ RIGHT    CompressibilityPhasicitySpontaneityPropertiesThrombus Aging      +---------+---------------+---------+-----------+----------+-------------------+ CFV      Full           Yes      Yes                                      +---------+---------------+---------+-----------+----------+-------------------+ SFJ      Full                                                             +---------+---------------+---------+-----------+----------+-------------------+ FV Prox  Full                                                             +---------+---------------+---------+-----------+----------+-------------------+ FV Mid   Full           Yes      Yes                                      +---------+---------------+---------+-----------+----------+-------------------+ FV DistalFull                                                             +---------+---------------+---------+-----------+----------+-------------------+ PFV      Full                                                              +---------+---------------+---------+-----------+----------+-------------------+ POP  Yes      Yes                  patent by color and                                                       Doppler             +---------+---------------+---------+-----------+----------+-------------------+ PTV                                                   Not well visualized +---------+---------------+---------+-----------+----------+-------------------+ PERO                                                  Not well visualized +---------+---------------+---------+-----------+----------+-------------------+   +---------+---------------+---------+-----------+----------+-------------------+ LEFT     CompressibilityPhasicitySpontaneityPropertiesThrombus Aging      +---------+---------------+---------+-----------+----------+-------------------+ CFV      Full           Yes      Yes                                      +---------+---------------+---------+-----------+----------+-------------------+ SFJ      Full                                                             +---------+---------------+---------+-----------+----------+-------------------+ FV Prox  Full                                                             +---------+---------------+---------+-----------+----------+-------------------+ FV Mid   Full                                                             +---------+---------------+---------+-----------+----------+-------------------+ FV DistalFull                                                             +---------+---------------+---------+-----------+----------+-------------------+ PFV      Full                                                             +---------+---------------+---------+-----------+----------+-------------------+  POP                                                   patent by color and                                                        Doppler             +---------+---------------+---------+-----------+----------+-------------------+ PTV                                                   Not well visualized +---------+---------------+---------+-----------+----------+-------------------+ PERO                                                  Not well visualized +---------+---------------+---------+-----------+----------+-------------------+     Summary: BILATERAL: - No evidence of deep vein thrombosis seen in the lower extremities, bilaterally. -No evidence of popliteal cyst, bilaterally.   *See table(s) above for measurements and observations. Electronically signed by Waverly Ferrari MD on 09/11/2022 at 8:39:28 PM.    Final     CT ANGIO HEAD NECK W WO CM   Result Date: 09/11/2022 CLINICAL DATA:  Stroke, determine embolic source. EXAM: CT ANGIOGRAPHY HEAD AND NECK WITH AND WITHOUT CONTRAST TECHNIQUE: Multidetector CT imaging of the head and neck was performed using the standard protocol during bolus administration of intravenous contrast. Multiplanar CT image reconstructions and MIPs were obtained to evaluate the vascular anatomy. Carotid stenosis measurements (when applicable) are obtained utilizing NASCET criteria, using the distal internal carotid diameter as the denominator. RADIATION DOSE REDUCTION: This exam was performed according to the departmental dose-optimization program which includes automated exposure control, adjustment of the mA and/or kV according to patient size and/or use of iterative reconstruction technique. CONTRAST:  75mL OMNIPAQUE IOHEXOL 350 MG/ML SOLN COMPARISON:  Brain MRI from yesterday FINDINGS: CT HEAD FINDINGS Brain: Small acute infarcts are largely occult compared to brain MRI. Multiple chronic infarcts including along the bilateral frontal parietal cortex (more extensive on the left) and at the left corona radiata. Small chronic cerebellar  infarcts. No acute hemorrhage, hydrocephalus, or masslike finding. Vascular: No hyperdense vessel or unexpected calcification. Skull: Normal. Negative for fracture or focal lesion. Sinuses/Orbits: No acute finding Review of the MIP images confirms the above findings CTA NECK FINDINGS Aortic arch: Atheromatous plaque with 3 vessel branching. Right carotid system: Atheromatous calcification at the carotid bifurcation. No flow reducing stenosis or ulceration. Left carotid system: Moderate mixed density plaque primarily at the bifurcation. No stenosis or ulceration. Vertebral arteries: No proximal subclavian stenosis although there is atherosclerosis on the right. The vertebral arteries are smoothly contoured and widely patent Skeleton: C5-6 ACDF with solid arthrodesis. Other neck: No acute finding Upper chest: No acute finding Review of the MIP images confirms the above findings CTA HEAD FINDINGS Anterior circulation: No branch occlusion, beading, or aneurysm. Atheromatous calcification along the carotid siphons. Hypoplastic right A1  segment Posterior circulation: The vertebral and basilar arteries are smoothly contoured and widely patent. No branch occlusion, beading, or aneurysm. Venous sinuses: Diffusely patent Anatomic variants: As above Review of the MIP images confirms the above findings IMPRESSION: No emergent vascular finding. There is atherosclerosis without flow reducing stenosis or ulceration of major arteries in the head and neck. Electronically Signed   By: Tiburcio Pea M.D.   On: 09/11/2022 10:22    MR ANGIO HEAD WO CONTRAST   Result Date: 09/10/2022 CLINICAL DATA:  Stroke/TIA, determine embolic source EXAM: MRA HEAD WITHOUT CONTRAST TECHNIQUE: Angiographic images of the Circle of Willis were acquired using MRA technique without intravenous contrast. COMPARISON:  MRA head 08/09/22 FINDINGS: Anterior circulation: No significant stenosis. No proximal occlusion. No aneurysm. Posterior circulation: No  significant stenosis. No proximal occlusion. No aneurysm. Anatomic variants: No Other: None. IMPRESSION: No intracranial large vessel occlusion or significant stenosis. Consider further evaluation with an MRA of the neck, this is not previously performed Electronically Signed   By: Lorenza Cambridge M.D.   On: 09/10/2022 18:51       Assessment/Plan: Diagnosis: Left basal ganglia and left parietal infarcts, concerning for cardioembolic origin Does the need for close, 24 hr/day medical supervision in concert with the patient's rehab needs make it unreasonable for this patient to be served in a less intensive setting? Yes Co-Morbidities requiring supervision/potential complications:  -Hypertension, prior CVA, history of orthostatic hypotension, hyperlipidemia, diabetes mellitus type 2, obesity, obstructive sleep apnea, depression, breast cancer history, hyperlipidemia, CKD, HTN Due to bladder management, bowel management, safety, skin/wound care, disease management, medication administration, pain management, and patient education, does the patient require 24 hr/day rehab nursing? Yes Does the patient require coordinated care of a physician, rehab nurse, therapy disciplines of physical therapy and Occupational Therapy to address physical and functional deficits in the context of the above medical diagnosis(es)? Yes Addressing deficits in the following areas: balance, endurance, locomotion, strength, transferring, bowel/bladder control, bathing, dressing, feeding, grooming, toileting, and psychosocial support Can the patient actively participate in an intensive therapy program of at least 3 hrs of therapy per day at least 5 days per week? Yes The potential for patient to make measurable gains while on inpatient rehab is excellent Anticipated functional outcomes upon discharge from inpatient rehab are modified independent  with PT, modified independent with OT, n/a with SLP. Estimated rehab length of stay to  reach the above functional goals is: 10-12 Anticipated discharge destination: Home Overall Rehab/Functional Prognosis: excellent   POST ACUTE RECOMMENDATIONS: This patient's condition is appropriate for continued rehabilitative care in the following setting: CIR Patient has agreed to participate in recommended program. Yes Note that insurance prior authorization may be required for reimbursement for recommended care.   Comment: I think she would be a good candidate for CIR when medically stable.     MEDICAL RECOMMENDATIONS: Consider additional medication for constipation such as sorbitol Consider Voltaren gel to assist with right shoulder pain     I have personally performed a face to face diagnostic evaluation of this patient. Additionally, I have examined the patient's medical record including any pertinent labs and radiographic images. If the physician assistant has documented in this note, I have reviewed and edited or otherwise concur with the physician assistant's documentation.   Thanks,   Fanny Dance, MD 09/12/2022         Revision History  Routing History

## 2022-09-14 NOTE — H&P (Signed)
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  Presented 09/10/2023 for with acute onset of right-sided weakness.  MRI showed acute infarct in the bilateral corona radiata and in the left parietal lobe without hemorrhage.  MRA showed no intracranial large vessel occlusion or significant stenosis.  CT angiogram of head and neck with no emergent vascular findings.  Most recent echocardiogram with ejection fraction of 60 to 65% no wall motion abnormalities grade 1 diastolic dysfunction.  Neurology consulted patient currently remains on aspirin as well as Plavix therapy x 3 weeks then Plavix alone.  Lovenox added for DVT prophylaxis.  Plan for loop recorder placement.  Hospital course follow-up podiatry services for chronic diabetic foot ulcers bilaterally with wound care using Xeroform with Mepilex bandage change daily.  Patient reports having some right shoulder pain due to overuse, this is improved with Voltaren gel.  Reports she had a small bowel movement this morning.  Tolerating a regular consistency diet.  Therapy evaluations completed due to patient decreased functional mobility was admitted for a comprehensive rehab program.   She plans to stay with her sister who can assist after discharge 24/7.  One-story home with 5 steps to enter   Review of Systems  Constitutional:  Negative for chills and fever.  HENT:  Negative for  congestion and hearing loss.   Eyes:  Negative for blurred vision and double vision.  Respiratory:  Positive for shortness of breath. Negative for cough and wheezing.   Cardiovascular:  Negative for chest pain and palpitations.  Gastrointestinal:  Positive for constipation. Negative for abdominal pain, heartburn, nausea and vomiting.  Genitourinary:  Negative for dysuria, flank pain and hematuria.  Musculoskeletal:  Positive for joint pain and myalgias.  Skin:  Negative for rash.  Neurological:  Positive for sensory change and weakness. Negative for headaches.  Psychiatric/Behavioral:  Positive for depression. The patient has insomnia.   All other systems reviewed and are negative.       Past Medical History:  Diagnosis Date   AKI (acute kidney injury) (HCC) 08/09/2022   Breast cancer (HCC) 12/2019    left breast DCIS   CKD (chronic kidney disease) 06/07/2016    Stage 2, GFR 60-89 ml/min   Diabetic polyneuropathy 06/07/2016   Dyslipidemia     Endometrial adenocarcinoma 2012   Fever blister     Hyperlipidemia     Hypertension, essential 06/07/2016   Major depressive disorder     Mixed dyslipidemia 06/07/2016   MRSA infection 10/2019    left foot wound   Obstructive sleep apnea 06/07/2016    does not use CPAP   Peripheral neuropathy     Retinopathy     Right rib fracture 08/09/2022   Severe obesity     Skin ulcer of left foot with fat layer exposed 11/02/2016   Stroke      Asymptomatic, discovered via neuroimaging; small infarct in left parietal lobe;  also concern for small b/l frontal infarcts   Tachycardia 07/19/2017   Traumatic rhabdomyolysis (HCC) 08/09/2022   Type 2 diabetes mellitus with hyperglycemia, with long-term current use of insulin 06/07/2016   Unilateral primary osteoarthritis, right knee 04/16/2019   UTI (urinary tract infection) 08/09/2022         Past Surgical History:  Procedure Laterality Date   ADRENALECTOMY       BREAST LUMPECTOMY WITH RADIOACTIVE  SEED LOCALIZATION Left 02/25/2020    Procedure: LEFT BREAST LUMPECTOMY WITH RADIOACTIVE SEED LOCALIZATION;  Surgeon: Abigail Miyamoto, MD;  Location: [facility];  Service: General;  Laterality: Left;   BUNIONECTOMY       CARDIAC CATHETERIZATION       CERVICAL SPINE SURGERY       CHOLECYSTECTOMY       DILATION AND CURETTAGE OF UTERUS       LAPAROSCOPIC SALPINGOOPHERECTOMY       THORACOTOMY       TONSILLECTOMY             Family History  Problem Relation Age of Onset   Hypertension Mother     Alzheimer's disease Mother     Heart attack Father     CAD Father     Alzheimer's disease Father     Bladder Cancer Father          dx late 76s   Diverticulitis Sister     Obesity Sister     Hypertension Sister     Voice disorder Brother     Breast cancer Paternal Aunt 19   Prostate cancer Paternal Uncle          dx mid 78s    Social History:  reports that she has never smoked. She has never used smokeless tobacco. She reports that she does not drink alcohol and does not use drugs. Allergies:       Allergies  Allergen Reactions   Elavil [Amitriptyline] Other (See Comments)      Unknown reaction   Pristiq [Desvenlafaxine] Other (See Comments)      Unknown allergy   Cipro [Ciprofloxacin Hcl] Rash   Penicillins Rash   Sulfa Antibiotics Rash   Sulfamethoxazole Rash   Sulfur Rash          Medications Prior to Admission  Medication Sig Dispense Refill   amLODipine (NORVASC) 10 MG tablet Take 1 tablet (10 mg total) by mouth daily. 30 tablet 0   ascorbic acid (VITAMIN C) 500 MG tablet Take 1 tablet (500 mg total) by mouth daily. 100 tablet 0   aspirin EC 81 MG tablet Take 1 tablet (81 mg total) by mouth daily. Swallow whole.       atorvastatin (LIPITOR) 80 MG tablet Take 1 tablet (80 mg total) by mouth daily. 30 tablet 0   bacitracin 500 UNIT/GM ointment Apply 1 Application topically daily.       buPROPion (WELLBUTRIN XL) 300 MG 24 hr tablet Take 1 tablet (300 mg total)  by mouth daily. 30 tablet 0   carvedilol (COREG) 6.25 MG tablet Take 1 tablet (6.25 mg total) by mouth 2 (two) times daily. 60 tablet 0   Cholecalciferol (VITAMIN D) 125 MCG (5000 UT) CAPS Take 5,000 Units by mouth every evening.       escitalopram (LEXAPRO) 20 MG tablet Take 1 tablet (20 mg total) by mouth daily. 30 tablet 0   insulin NPH-regular Human (NOVOLIN 70/30) (70-30) 100 UNIT/ML injection Inject 20-24 Units into the skin See admin instructions.  24 units in the morning, 20 units in the evening       losartan (COZAAR) 25 MG tablet Take 1 tablet (25 mg total) by mouth every morning. 30 tablet 0   metFORMIN (GLUCOPHAGE) 500 MG tablet Take 1 tablet (500 mg total) by mouth 2 (two) times daily with a meal. 60 tablet 0   tamoxifen (NOLVADEX) 20 MG tablet TAKE ONE TABLET BY MOUTH EVERY MORNING (Patient taking differently: Take 20 mg by mouth daily.) 90 tablet 0   Zinc Sulfate 220 (50 Zn) MG TABS Take 1 tablet (220 mg total) by mouth daily. 100 tablet 0   clopidogrel (PLAVIX) 75 MG tablet Take 1 tablet (75 mg total) by mouth daily. (Patient not taking: Reported on 09/11/2022) 5 tablet 0   doxycycline (VIBRA-TABS) 100 MG tablet Take 1 tablet (100 mg total) by mouth every 12 (twelve) hours. (Patient not taking: Reported on 09/11/2022) 6 tablet 0   metFORMIN (GLUCOPHAGE) 1000 MG tablet Take 1 tablet (1,000 mg total) by mouth 2 (two) times daily with a meal. (Patient not taking: Reported on 09/11/2022) 60 tablet 0          Home: Home Living Family/patient expects to be discharged to:: Private residence Living Arrangements:  (lives with sister and brother in Social worker) Available Help at Discharge: Family, Available 24 hours/day Type of Home: House Home Access: Stairs to enter Secretary/administrator of Steps: 5 Entrance Stairs-Rails: Right, Left Home Layout: One level Bathroom Shower/Tub: Engineer, manufacturing systems:  (bedside commode over top of it during the day) Bathroom Accessibility: Yes Home  Equipment: Rollator (4 wheels), Tub bench, BSC/3in1 Additional Comments: [facility] PT and OT  Lives With: Family   Functional History: Prior Function Prior Level of Function : Independent/Modified Independent, Driving Mobility Comments: Mod I with rollator ADLs Comments: tub bench for shower, pt was independent with all ADL except donning ted hose which family would assist with. family also assisted with IADL   Functional Status:  Mobility: Bed Mobility Overal bed mobility: Needs Assistance Bed Mobility: Supine to Sit Supine to sit: Min assist Sit to supine: Mod assist General bed mobility comments: pt able to bring LEs off EOB, minA for trunk elevation, tactile cues to bring L UE across to R hand rail to push up, assist to scoot forward so feet on the floor Transfers Overall transfer level: Needs assistance Equipment used: Rolling walker (2 wheels) Transfers: Sit to/from Stand Sit to Stand: Mod assist General transfer comment: modA to power up, easier from higher surface height Ambulation/Gait Ambulation/Gait assistance: Min assist Gait Distance (Feet): 120 Feet Assistive device: Rolling walker (2 wheels) Gait Pattern/deviations: Decreased step length - right, Step-through pattern, Decreased dorsiflexion - right, Trunk flexed, Decreased stride length General Gait Details: pt with improved ability to consistently clear R foot  this date for majority of time, onset of fatigue came s/p 82' requiring more verbal cues to incresaed R LE step height and to make the knee straight when advancing L LE to minimize R knee instability/buckling at onset of fatigue Gait velocity: dec Gait velocity interpretation: <1.31 ft/sec, indicative of household ambulator   ADL: ADL Overall ADL's : Needs assistance/impaired Eating/Feeding: Set up, Sitting Grooming: Set up, Sitting, Standing (patient alternating between sitting and standing at sink  PAtient standing fro approx 1 min before seated rest  break) Upper Body Bathing: Set up, Sitting Lower Body Bathing: Minimal assistance, Sit to/from stand Upper Body Dressing : Set up, Sitting Lower Body Dressing: Moderate assistance, Sit  to/from Pharmacist, hospital Details (indicate cue type and reason): pt taking lateral side steps along EOB with minA, deferred further mobility secondary to fatigue Toileting- Clothing Manipulation and Hygiene: Moderate assistance, Sit to/from stand Toileting - Clothing Manipulation Details (indicate cue type and reason): assist due to heavy reliance on BUE support in standing Functional mobility during ADLs: Minimal assistance, Rolling walker (2 wheels), Cueing for safety General ADL Comments: pt limited by decreased endurance, decreased activity tolerance, instability and LE weakness   Cognition: Cognition Overall Cognitive Status: Within Functional Limits for tasks assessed Arousal/Alertness: Awake/alert Orientation Level: Oriented X4 Attention: Selective Sustained Attention: Appears intact Selective Attention: Appears intact Memory: Appears intact Awareness: Appears intact Cognition Arousal/Alertness: Awake/alert Behavior During Therapy: Flat affect Overall Cognitive Status: Within Functional Limits for tasks assessed General Comments: slow processing and depressed spirits but able to follow all commands and expressed "I'm proud of all the stuff I did today.", perking up her spirits   Physical Exam: Blood pressure (!) 146/61, pulse 64, temperature 97.9 F (36.6 C), temperature source Oral, resp. rate 12, height 5\' 9"  (1.753 m), weight 103 kg, SpO2 98 %.   General:  No apparent distress HEENT: Head is normocephalic, atraumatic, PERRLA, EOMI, sclera anicteric, oral mucosa pink and moist Neck: Supple without JVD or lymphadenopathy Heart: Reg rate and rhythm. No murmurs rubs or gallops Chest: CTA bilaterally without wheezes, rales, or rhonchi; no distress Abdomen: Soft, non-tender,  mildly-distended, bowel sounds positive. Extremities: No clubbing, cyanosis, or edema. Pulses are 2+ Psych: Pt's affect is flat, a little anxious appearing Skin: Warm and dry Neuro: Alert and oriented to person, place, year, month, speech appears to be a slowed and delayed responses noted.  Occasional word finding difficulties.  Cranial nerves II through XII intact. Able to name and repeat. Recalls 2/3 words 5 min later.  Decreased sensation to light touch below her knees, intact in bilateral upper extremities and face Strength bilateral lower extremities 3/5 hip flexion, 4-/5 knee extension, 4- out of 5 ankle PF and DF Left upper extremity 4 out of 5 shoulder abduction, elbow flexion and extension, finger flexion Right upper extremity 4- out of 5 shoulder abduction, 4 out of 5 elbow flexion and extension, finger flexion DTR decreased in bilateral lower extremities at patella and ankle, DTR normal and symmetric bilateral upper extremities Musculoskeletal:  Mild right shoulder tenderness to palpation, minimal pain with shoulder passive ROM-improved   IV left upper extremity looks okay       Lab Results Last 48 Hours        Results for orders placed or performed during the hospital encounter of 09/10/22 (from the past 48 hour(s))  Glucose, capillary     Status: Abnormal    Collection Time: 09/12/22 12:39 PM  Result Value Ref Range    Glucose-Capillary 188 (H) 70 - 99 mg/dL      Comment: Glucose reference range applies only to samples taken after fasting for at least 8 hours.  Glucose, capillary     Status: Abnormal    Collection Time: 09/12/22  5:18 PM  Result Value Ref Range    Glucose-Capillary 131 (H) 70 - 99 mg/dL      Comment: Glucose reference range applies only to samples taken after fasting for at least 8 hours.  Glucose, capillary     Status: Abnormal    Collection Time: 09/12/22  9:28 PM  Result Value Ref Range    Glucose-Capillary 141 (H) 70 - 99 mg/dL  Comment: Glucose  reference range applies only to samples taken after fasting for at least 8 hours.  Glucose, capillary     Status: Abnormal    Collection Time: 09/13/22  8:04 AM  Result Value Ref Range    Glucose-Capillary 151 (H) 70 - 99 mg/dL      Comment: Glucose reference range applies only to samples taken after fasting for at least 8 hours.  Glucose, capillary     Status: Abnormal    Collection Time: 09/13/22 11:50 AM  Result Value Ref Range    Glucose-Capillary 201 (H) 70 - 99 mg/dL      Comment: Glucose reference range applies only to samples taken after fasting for at least 8 hours.  Glucose, capillary     Status: Abnormal    Collection Time: 09/13/22  4:06 PM  Result Value Ref Range    Glucose-Capillary 345 (H) 70 - 99 mg/dL      Comment: Glucose reference range applies only to samples taken after fasting for at least 8 hours.  Glucose, capillary     Status: Abnormal    Collection Time: 09/13/22  9:38 PM  Result Value Ref Range    Glucose-Capillary 160 (H) 70 - 99 mg/dL      Comment: Glucose reference range applies only to samples taken after fasting for at least 8 hours.  Glucose, capillary     Status: Abnormal    Collection Time: 09/14/22  8:03 AM  Result Value Ref Range    Glucose-Capillary 166 (H) 70 - 99 mg/dL      Comment: Glucose reference range applies only to samples taken after fasting for at least 8 hours.      Imaging Results (Last 48 hours)  No results found.         Blood pressure (!) 146/61, pulse 64, temperature 97.9 F (36.6 C), temperature source Oral, resp. rate 12, height 5\' 9"  (1.753 m), weight 103 kg, SpO2 98 %.   Medical Problem List and Plan: 1. Functional deficits secondary to left basal ganglia and left parietal small/punctate infarcts as well as history of right MCA infarction.  Scheduled for loop recorder prior to admission to CIR             -patient may shower             -ELOS/Goals: 7 to 10 days, supervision with PT and OT             -Admit to  CIR 2.  Antithrombotics: -DVT/anticoagulation:  Pharmaceutical: Lovenox             -antiplatelet therapy: Aspirin 81 mg daily and Plavix 75 mg daily x 3 weeks then Plavix alone 3. Pain Management: Voltaren gel 4 g 4 times daily, Tylenol as needed 4. Mood/Behavior/Sleep: Wellbutrin 300 mg daily, Lexapro 20 mg daily, melatonin as needed             -antipsychotic agents: N/A 5. Neuropsych/cognition: This patient is capable of making decisions on her own behalf. 6. Skin/Wound Care:/Bilateral diabetic foot ulcers.  Skin care as directed routine skin checks 7. Fluids/Electrolytes/Nutrition: Routine in and outs with follow-up chemistries 8.  Diabetes mellitus with peripheral neuropathy.  Latest hemoglobin A1c 7.0.  NovoLog Mix 70/30 20 units twice daily             -Prior use of metformin, to restart if needed 9.  Hypertension.  Coreg 3.125 mg twice daily, Norvasc 5 mg daily.  Monitor with increased mobility.  Long-term  BP goal normotensive.  Avoid hypotension/dehydration. 10.  Obesity.  BMI 33.52.  Dietary follow-up 11.  CKD IIIa, creatinine baseline 1.57-1.84.  Follow-up chemistries  12.  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  Follow-up outpatient Dr Pamelia Hoit.  Continue Nolvadex. 13.  Hyperlipidemia.  Lipitor 80 mg daily 14.  Right shoulder pain             -Improved, continue Voltaren gel      I have personally performed a face to face diagnostic evaluation of this patient and formulated the key components of the plan.  Additionally, I have personally reviewed laboratory data, imaging studies, as well as relevant notes and concur with the physician assistant's documentation above.  The patient's status has not changed from the original H&P.  Any changes in documentation from the acute care chart have been noted above.  Fanny Dance, MD, FAAPMR      Mcarthur Rossetti Angiulli, PA-C 09/14/2022

## 2022-09-14 NOTE — Progress Notes (Signed)
Physical Therapy Treatment Patient Details Name: [name] MRN: [id number] DOB: 28-Apr-1950 Today's Date: 09/14/2022   History of Present Illness Pt is a 72 y.o. female who presented 09/10/22 with R-sided weakness. MRI showed new acute infarcts in the bilateral corona radiata and in the left parietal lobe. Pt recently admitted 08/08/22  with CVA and R rib fx and discharged to [facility] then home. PMH: ductal carcinoma in situ of left breast, DM2, obesity, CKD, HLD, HTN, OSA    PT Comments    Pt received in supine, agreeable to therapy session after finishing her sandwich and with good particiaption and tolerance for transfer and gait training. Gait progression limited however due to episode of urinary incontinence halfway to bathroom and need for bathing/linen change afterward. Pt with good effort for seated reaching/leaning task and needing up to minA for transfers from elevated surface height after dense set-up cues and minA from hospital bed using rail/HOB elevated. Pt continues to benefit from PT services to progress toward functional mobility goals.    Recommendations for follow up therapy are one component of a multi-disciplinary discharge planning process, led by the attending physician.  Recommendations may be updated based on patient status, additional functional criteria and insurance authorization.  Follow Up Recommendations       Assistance Recommended at Discharge Frequent or constant Supervision/Assistance  Patient can return home with the following A lot of help with walking and/or transfers;A little help with bathing/dressing/bathroom;Assistance with cooking/housework;Help with stairs or ramp for entrance;Assist for transportation   Equipment Recommendations  None recommended by PT    Recommendations for Other Services Rehab consult     Precautions / Restrictions Precautions Precautions: Fall Precaution Comments: urinary incontinence Restrictions Weight Bearing  Restrictions: No     Mobility  Bed Mobility Overal bed mobility: Needs Assistance Bed Mobility: Supine to Sit       Sit to supine: Min assist, HOB elevated   General bed mobility comments: pt able to bring LEs off EOB, minA for trunk elevation, verbal cues to bring L UE across to R hand rail to push up, assist to scoot forward to foot flat.    Transfers Overall transfer level: Needs assistance Equipment used: Rolling walker (2 wheels) Transfers: Sit to/from Stand Sit to Stand: Min assist, From elevated surface           General transfer comment: from elevated bed height>RW and RW<>elevated BSC and using wall rail with LUE to assist with rising. Cues for safety/sequencing with decreased anterior weight shift noted despite cues.    Ambulation/Gait Ambulation/Gait assistance: Min assist, Min guard Gait Distance (Feet): 18 Feet (x2 with seated break) Assistive device: Rolling walker (2 wheels) Gait Pattern/deviations: Decreased step length - right, Step-through pattern, Decreased dorsiflexion - right, Trunk flexed, Decreased stride length Gait velocity: dec     General Gait Details: Gait distance limited due to episode of urinary incontinence with minimal warning and after gown change/lower body clean-up, rehab admissions coordinator arriving to room to speak with pt and have her sign paperwork. Pt needs cues for improved LUE grip on RW and closer proximity within AD as well as posture and forward gaze. PTA assist with line mgmt as well.   Stairs             Wheelchair Mobility    Modified Rankin (Stroke Patients Only) Modified Rankin (Stroke Patients Only) Pre-Morbid Rankin Score: Moderately severe disability Modified Rankin: Moderately severe disability     Balance Overall balance assessment: Needs assistance  Sitting-balance support: Single extremity supported, Feet supported Sitting balance-Leahy Scale: Fair Sitting balance - Comments: pt able to maintain  EOB balance this date without use of UEs while feet were supported on the floor   Standing balance support: Single extremity supported, During functional activity Standing balance-Leahy Scale: Fair Standing balance comment: pt stood at [employer] with Supervision to min guard for static tasks and up to minA for dynamic tasks                            Cognition Arousal/Alertness: Awake/alert Behavior During Therapy: Flat affect Overall Cognitive Status: Within Functional Limits for tasks assessed                                 General Comments: Some slow processing but good effort and some insight into deficits/need for toileting, however pt unable to indicate severity of urgency.        Exercises General Exercises - Lower Extremity Ankle Circles/Pumps: AROM, Both, Seated, 20 reps (10 reps x 2 sets)    General Comments General comments (skin integrity, edema, etc.): VSS on RA      Pertinent Vitals/Pain Pain Assessment Pain Assessment: No/denies pain Pain Intervention(s): Monitored during session, Repositioned    Home Living                          Prior Function            PT Goals (current goals can now be found in the care plan section) Acute Rehab PT Goals Patient Stated Goal: to improve at rehab so I can do more at home PT Goal Formulation: With patient Time For Goal Achievement: 09/25/22 Progress towards PT goals: Progressing toward goals    Frequency    Min 4X/week      PT Plan Current plan remains appropriate       AM-PAC PT "6 Clicks" Mobility   Outcome Measure  Help needed turning from your back to your side while in a flat bed without using bedrails?: A Little Help needed moving from lying on your back to sitting on the side of a flat bed without using bedrails?: A Lot (a little from hospital bed) Help needed moving to and from a bed to a chair (including a wheelchair)?: A Little Help needed standing up from a chair  using your arms (e.g., wheelchair or bedside chair)?: A Lot (from lower chair height) Help needed to walk in hospital room?: A Little Help needed climbing 3-5 steps with a railing? : Total 6 Click Score: 14    End of Session Equipment Utilized During Treatment: Gait belt Activity Tolerance: Patient tolerated treatment well;Treatment limited secondary to medical complications (Comment);Other (comment) (incontinence and need for clean-up) Patient left: in chair;with call bell/phone within reach;with chair alarm set;Other (comment) (rehab admissions coordinator arriving to room) Nurse Communication: Mobility status (purwick malfunction and patient saturated in urine, pt may need new purewick placed after more extensive bath vs having family bring in briefs for her.) PT Visit Diagnosis: Unsteadiness on feet (R26.81);Other abnormalities of gait and mobility (R26.89);Muscle weakness (generalized) (M62.81);Difficulty in walking, not elsewhere classified (R26.2);Other symptoms and signs involving the nervous system (R29.898);Hemiplegia and hemiparesis Hemiplegia - Right/Left: Right Hemiplegia - dominant/non-dominant: Dominant Hemiplegia - caused by: Cerebral infarction     Time: 2956-2130 PT Time Calculation (min) (ACUTE ONLY): 35 min  Charges:  $Gait Training: 8-22 mins $Therapeutic Activity: 8-22 mins                     Nella Botsford P., PTA Acute Rehabilitation Services Secure Chat Preferred 9a-5:30pm Office: [phone]    Dorathy Kinsman [facility] 09/14/2022, 2:04 PM

## 2022-09-14 NOTE — Inpatient Diabetes Management (Signed)
Inpatient Diabetes Program Recommendations  AACE/ADA: New Consensus Statement on Inpatient Glycemic Control (2015)  Target Ranges:  Prepandial:   less than 140 mg/dL      Peak postprandial:   less than 180 mg/dL (1-2 hours)      Critically ill patients:  140 - 180 mg/dL   Lab Results  Component Value Date   GLUCAP 232 (H) 09/14/2022   HGBA1C 7.0 (H) 08/09/2022    Review of Glycemic Control  Latest Reference Range & Units 09/13/22 08:04 09/13/22 11:50 09/13/22 16:06 09/13/22 21:38 09/14/22 08:03 09/14/22 11:15  Glucose-Capillary 70 - 99 mg/dL 161 (H) 096 (H) 045 (H) 160 (H) 166 (H) 232 (H)   Diabetes history: DM 2 Outpatient Diabetes medications: 70/30 24 units qam, 20 units qpm, metformin 500 mg bid Current orders for Inpatient glycemic control: 70/30 20 units bid Novolog 0-9 units tid  Transferring to CIR today A1c 7% on 5/21  Inpatient Diabetes Program Recommendations:    Note:  Glucose trends increase throughout the day, fasting glucose trends okay.  -  Consider increasing 70/30 morning dose to 25 units qam. Keep pm dose at 20 units.  Thanks, Christena Deem RN, MSN, BC-ADM Inpatient Diabetes Coordinator Team Pager [phone] (8a-5p)

## 2022-09-14 NOTE — Discharge Instructions (Addendum)
Inpatient Rehab Discharge Instructions  [name] Discharge date and time: No discharge date for patient encounter.   Activities/Precautions/ Functional Status: Activity: As tolerated Diet: Diabetic diet Wound Care: Routine skin checks Functional status:  ___ No restrictions     ___ Walk up steps independently ___ 24/7 supervision/assistance   ___ Walk up steps with assistance ___ Intermittent supervision/assistance  ___ Bathe/dress independently ___ Walk with walker     _x__ Bathe/dress with assistance ___ Walk Independently    ___ Shower independently ___ Walk with assistance    ___ Shower with assistance ___ No alcohol     ___ Return to work/school ________  Special Instructions: No driving smoking or alcohol  Aspirin 81 mg daily and Plavix 75 mg daily until 10/02/2022 then Plavix alone  Daily dressings bilateral feet.  Apply Xeroform to wounds.  Apply Mepilex dressing overlying the Xeroform's and ulcers daily  COMMUNITY REFERRALS UPON DISCHARGE:    Home Health:   PT     OT     RN                 Agency: [facility]  Phone: [phone] *Please expect follow-up within 2-3 days to schedule your home visit. If you have not received follow-up, be sure to contact the branch directly.*     My questions have been answered and I understand these instructions. I will adhere to these goals and the provided educational materials after my discharge from the hospital.  Patient/Caregiver Signature _______________________________ Date __________  Clinician Signature _______________________________________ Date __________  Please bring this form and your medication list with you to all your follow-up doctor's appointments.    STROKE/TIA DISCHARGE INSTRUCTIONS SMOKING Cigarette smoking nearly doubles your risk of having a stroke & is the single most alterable risk factor  If you smoke or have smoked in the last 12 months, you are advised to quit smoking for your health.  Most of the excess cardiovascular risk related to smoking disappears within a year of stopping. Ask you doctor about anti-smoking medications [place] Quit Line: 1-800-QUIT NOW Free Smoking Cessation Classes [phone]  CHOLESTEROL Know your levels; limit fat & cholesterol in your diet  Lipid Panel     Component Value Date/Time   CHOL 121 08/09/2022 0328   TRIG 144 08/09/2022 0328   HDL 34 (L) 08/09/2022 0328   CHOLHDL 3.6 08/09/2022 0328   VLDL 29 08/09/2022 0328   LDLCALC 58 08/09/2022 0328     Many patients benefit from treatment even if their cholesterol is at goal. Goal: Total Cholesterol (CHOL) less than 160 Goal:  Triglycerides (TRIG) less than 150 Goal:  HDL greater than 40 Goal:  LDL (LDLCALC) less than 100   BLOOD PRESSURE American Stroke Association blood pressure target is less that 120/80 mm/Hg  Your discharge blood pressure is:  BP: (!) 155/57 Monitor your blood pressure Limit your salt and alcohol intake Many individuals will require more than one medication for high blood pressure  DIABETES (A1c is a blood sugar average for last 3 months) Goal HGBA1c is under 7% (HBGA1c is blood sugar average for last 3 months)  Diabetes:     Lab Results  Component Value Date   HGBA1C 7.0 (H) 08/09/2022    Your HGBA1c can be lowered with medications, healthy diet, and exercise. Check your blood sugar as directed by your physician Call your physician if you experience unexplained or low blood sugars.  PHYSICAL ACTIVITY/REHABILITATION Goal is 30 minutes at least 4  days per week  Activity: Increase activity slowly, Therapies: Physical Therapy: Home Health Return to work:  Activity decreases your risk of heart attack and stroke and makes your heart stronger.  It helps control your weight and blood pressure; helps you relax and can improve your mood. Participate in a regular exercise program. Talk with your doctor about the best form of exercise for you (dancing, walking, swimming,  cycling).  DIET/WEIGHT Goal is to maintain a healthy weight  Your discharge diet is:  Diet Order             Diet heart healthy/carb modified Room service appropriate? Yes; Fluid consistency: Thin  Diet effective now                   liquids Your height is:  Height: 5\' 9"  (175.3 cm) Your current weight is: Weight: 100 kg Your Body Mass Index (BMI) is:  BMI (Calculated): 32.54 Following the type of diet specifically designed for you will help prevent another stroke. Your goal weight range is:   Your goal Body Mass Index (BMI) is 19-24. Healthy food habits can help reduce 3 risk factors for stroke:  High cholesterol, hypertension, and excess weight.  RESOURCES Stroke/Support Group:  Call [phone]   STROKE EDUCATION PROVIDED/REVIEWED AND GIVEN TO PATIENT Stroke warning signs and symptoms How to activate emergency medical system (call 911). Medications prescribed at discharge. Need for follow-up after discharge. Personal risk factors for stroke. Pneumonia vaccine given: No Flu vaccine given: No My questions have been answered, the writing is legible, and I understand these instructions.  I will adhere to these goals & educational materials that have been provided to me after my discharge from the hospital.

## 2022-09-14 NOTE — Progress Notes (Signed)
OT Cancellation Note  Patient Details Name: [name] MRN: [id number] DOB: [birth year]   Cancelled Treatment:    Reason Eval/Treat Not Completed: (P) Fatigue/lethargy limiting ability to participate, Pt just returned from a medical procedure, feeling drowsy and fatigued.  Alexis Goodell 09/14/2022, 5:28 PM

## 2022-09-14 NOTE — Plan of Care (Signed)
  Problem: Education: Goal: Knowledge of disease or condition will improve Outcome: Progressing   

## 2022-09-15 ENCOUNTER — Encounter (HOSPITAL_COMMUNITY): Payer: Self-pay | Admitting: Cardiology

## 2022-09-15 ENCOUNTER — Telehealth: Payer: Self-pay

## 2022-09-15 DIAGNOSIS — L97429 Non-pressure chronic ulcer of left heel and midfoot with unspecified severity: Secondary | ICD-10-CM

## 2022-09-15 DIAGNOSIS — K5900 Constipation, unspecified: Secondary | ICD-10-CM | POA: Diagnosis not present

## 2022-09-15 DIAGNOSIS — L97419 Non-pressure chronic ulcer of right heel and midfoot with unspecified severity: Secondary | ICD-10-CM | POA: Diagnosis not present

## 2022-09-15 DIAGNOSIS — E11621 Type 2 diabetes mellitus with foot ulcer: Secondary | ICD-10-CM | POA: Diagnosis not present

## 2022-09-15 DIAGNOSIS — I10 Essential (primary) hypertension: Secondary | ICD-10-CM

## 2022-09-15 DIAGNOSIS — I69398 Other sequelae of cerebral infarction: Secondary | ICD-10-CM

## 2022-09-15 LAB — COMPREHENSIVE METABOLIC PANEL
ALT: 17 U/L (ref 0–44)
AST: 15 U/L (ref 15–41)
Albumin: 2.7 g/dL — ABNORMAL LOW (ref 3.5–5.0)
Alkaline Phosphatase: 34 U/L — ABNORMAL LOW (ref 38–126)
Anion gap: 8 (ref 5–15)
BUN: 41 mg/dL — ABNORMAL HIGH (ref 8–23)
CO2: 26 mmol/L (ref 22–32)
Calcium: 8.8 mg/dL — ABNORMAL LOW (ref 8.9–10.3)
Chloride: 103 mmol/L (ref 98–111)
Creatinine, Ser: 1.52 mg/dL — ABNORMAL HIGH (ref 0.44–1.00)
GFR, Estimated: 36 mL/min — ABNORMAL LOW (ref >60)
Glucose, Bld: 140 mg/dL — ABNORMAL HIGH (ref 70–99)
Potassium: 3.9 mmol/L (ref 3.5–5.1)
Sodium: 137 mmol/L (ref 135–145)
Total Bilirubin: 0.5 mg/dL (ref 0.3–1.2)
Total Protein: 6.4 g/dL — ABNORMAL LOW (ref 6.5–8.1)

## 2022-09-15 LAB — CBC WITH DIFFERENTIAL/PLATELET
Abs Immature Granulocytes: 0.04 10*3/uL (ref 0.00–0.07)
Basophils Absolute: 0.1 10*3/uL (ref 0.0–0.1)
Basophils Relative: 1 %
Eosinophils Absolute: 0.1 10*3/uL (ref 0.0–0.5)
Eosinophils Relative: 2 %
HCT: 29.9 % — ABNORMAL LOW (ref 36.0–46.0)
Hemoglobin: 9.9 g/dL — ABNORMAL LOW (ref 12.0–15.0)
Immature Granulocytes: 1 %
Lymphocytes Relative: 34 %
Lymphs Abs: 2.2 10*3/uL (ref 0.7–4.0)
MCH: 32.7 pg (ref 26.0–34.0)
MCHC: 33.1 g/dL (ref 30.0–36.0)
MCV: 98.7 fL (ref 80.0–100.0)
Monocytes Absolute: 0.7 10*3/uL (ref 0.1–1.0)
Monocytes Relative: 11 %
Neutro Abs: 3.4 10*3/uL (ref 1.7–7.7)
Neutrophils Relative %: 51 %
Platelets: 221 10*3/uL (ref 150–400)
RBC: 3.03 MIL/uL — ABNORMAL LOW (ref 3.87–5.11)
RDW: 13.5 % (ref 11.5–15.5)
WBC: 6.5 10*3/uL (ref 4.0–10.5)
nRBC: 0 % (ref 0.0–0.2)

## 2022-09-15 LAB — GLUCOSE, CAPILLARY
Glucose-Capillary: 135 mg/dL — ABNORMAL HIGH (ref 70–99)
Glucose-Capillary: 213 mg/dL — ABNORMAL HIGH (ref 70–99)
Glucose-Capillary: 224 mg/dL — ABNORMAL HIGH (ref 70–99)
Glucose-Capillary: 210 mg/dL — ABNORMAL HIGH (ref 70–99)

## 2022-09-15 MED ORDER — POLYETHYLENE GLYCOL 3350 17 G PO PACK
17.0000 g | PACK | Freq: Every day | ORAL | Status: DC
  Administered 2022-09-15 – 2022-09-27 (×9): 17 g via ORAL
  Filled 2022-09-15 (×13): qty 1

## 2022-09-15 MED ORDER — HYDRALAZINE HCL 10 MG PO TABS
10.0000 mg | ORAL_TABLET | Freq: Three times a day (TID) | ORAL | Status: DC | PRN
  Administered 2022-09-30: 10 mg via ORAL
  Filled 2022-09-15: qty 1

## 2022-09-15 MED ORDER — SENNA 8.6 MG PO TABS
1.0000 | ORAL_TABLET | Freq: Every day | ORAL | Status: DC
  Administered 2022-09-15 – 2022-09-27 (×13): 8.6 mg via ORAL
  Filled 2022-09-15 (×13): qty 1

## 2022-09-15 NOTE — Progress Notes (Signed)
[name], female   DOB: Nov 11, 1950, 72 y.o.   MRN: [id number]  SW familiar with pt from previous admission. Please refer to assessment completed on 08/15/22.  0911-SW left message for [name] to discuss discharge plan, and requested return phone call.  *SW spoke with [name]. She reports pt can return to her home, and however, would like if she can be more independent if possible as she is also caring for her husband who recently had surgery for cancer. She is concerned about pt physical limitations, but willing to support her.   SW met with pt in room to welcome back to rehab. She was sitting in chair and has just finished lunch. Grateful for sister. She is aware SW will provide updates.   [name], MSW, LCSWA Office: [phone] Cell: [phone] Fax: [phone]

## 2022-09-15 NOTE — Progress Notes (Signed)
PROGRESS NOTE   Subjective/Complaints:  No events overnight.  Mildly hypertensive, asymptomatic. Labs this a.m. look unchanged Patient endorses frustration regarding being readmitted to the hospital, states that she was sitting at home doing nothing when her stroke recurred.  Regarding her wounds, she states that her sister has been taking excellent care of them with daily dressing changes, and they also have some assistance in the home, she feels they are healing well.  Denies any current pain.  Feels she is sleeping, eating well.  ROS: Denies fevers, chills, N/V, abdominal pain, constipation, diarrhea, SOB, cough, chest pain, new weakness or paraesthesias.    Objective:   EP PPM/ICD IMPLANT  Result Date: 09/14/2022 CONCLUSIONS:  1. Successful implantation of a implantable loop recorder for Cryptogenic stroke  2. No early apparent complications.   Recent Labs    09/14/22 1833 09/15/22 0609  WBC 8.2 6.5  HGB 9.9* 9.9*  HCT 29.9* 29.9*  PLT 226 221   Recent Labs    09/14/22 1833 09/15/22 0609  NA  --  137  K  --  3.9  CL  --  103  CO2  --  26  GLUCOSE  --  140*  BUN  --  41*  CREATININE 1.78* 1.52*  CALCIUM  --  8.8*    Intake/Output Summary (Last 24 hours) at 09/15/2022 0848 Last data filed at 09/15/2022 0700 [place] per 24 hour  Intake 368 ml  Output --  Net 368 ml        Physical Exam: Vital Signs Blood pressure (!) 155/57, pulse 63, temperature 97.6 F (36.4 C), resp. rate 16, height 5\' 9"  (1.753 m), weight 100 kg, SpO2 96 %. General:  No apparent distress.  Sitting up in bedside wheelchair HEENT: EOMI sclera anicteric, oral mucosa pink and moist Neck: Supple without JVD or lymphadenopathy Heart: Reg rate and rhythm. No murmurs rubs or gallops Chest: CTA bilaterally without wheezes, rales, or rhonchi; no distress Abdomen: Soft, non-tender, mildly-distended, bowel sounds positive. Extremities: 2+  nonpitting edema in bilateral lower extremities.   Psych: Pt's affect is flat, a little anxious appearing Skin: Warm and dry.   + heel wounds appear to be healing compared to last admission, left heel wound remains mildly open.  A Mepilex bilaterally Neuro:, Alert, oriented x 3 + Occasional word finding difficulties.   + Stocking glove neuropathy Strength bilateral lower extremities 3/5 hip flexion, 4-/5 knee extension, 4- out of 5 ankle PF and DF Left upper extremity 4 out of 5 shoulder abduction, elbow flexion and extension, finger flexion Right upper extremity 4- out of 5 shoulder abduction, 4 out of 5 elbow flexion and extension, finger flexion  Musculoskeletal:  Mild right shoulder tenderness to palpation, minimal pain with shoulder passive ROM-improved    Assessment/Plan: 1. Functional deficits which require 3+ hours per day of interdisciplinary therapy in a comprehensive inpatient rehab setting. Physiatrist is providing close team supervision and 24 hour management of active medical problems listed below. Physiatrist and rehab team continue to assess barriers to discharge/monitor patient progress toward functional and medical goals  Care Tool:  Bathing  Bathing assist       Upper Body Dressing/Undressing Upper body dressing        Upper body assist      Lower Body Dressing/Undressing Lower body dressing            Lower body assist       Toileting Toileting    Toileting assist       Transfers Chair/bed transfer  Transfers assist           Locomotion Ambulation   Ambulation assist              Walk 10 feet activity   Assist           Walk 50 feet activity   Assist           Walk 150 feet activity   Assist           Walk 10 feet on uneven surface  activity   Assist           Wheelchair     Assist               Wheelchair 50 feet with 2 turns activity    Assist             Wheelchair 150 feet activity     Assist          Blood pressure (!) 155/57, pulse 63, temperature 97.6 F (36.4 C), resp. rate 16, height 5\' 9"  (1.753 m), weight 100 kg, SpO2 96 %.  1. Functional deficits secondary to left basal ganglia and left parietal small/punctate infarcts as well as history of right MCA infarction.  Scheduled for loop recorder prior to admission to CIR             -patient may shower             -ELOS/Goals: 7 to 10 days, supervision with PT and OT             -Admit to CIR 2.  Antithrombotics: -DVT/anticoagulation:  Pharmaceutical: Lovenox             -antiplatelet therapy: Aspirin 81 mg daily and Plavix 75 mg daily x 3 weeks then Plavix alone 3. Pain Management: Voltaren gel 4 g 4 times daily, Tylenol as needed 4. Mood/Behavior/Sleep: Wellbutrin 300 mg daily, Lexapro 20 mg daily, melatonin as needed             -antipsychotic agents: N/A 5. Neuropsych/cognition: This patient is capable of making decisions on her own behalf. 6. Skin/Wound Care:/Bilateral diabetic foot ulcers.  Skin care as directed routine skin checks  -Mepilex to foot ulcers, Prevalon boots  7. Fluids/Electrolytes/Nutrition: Routine in and outs with follow-up chemistries 8.  Diabetes mellitus with peripheral neuropathy.  Latest hemoglobin A1c 7.0.  NovoLog Mix 70/30 20 units twice daily             -Prior use of metformin, to restart if needed  -Monitor today, blood glucose mildly elevated overnight. Recent Labs    09/14/22 1703 09/14/22 2043 09/15/22 0624  GLUCAP 190* 224* 135*     9.  Hypertension.  Coreg 3.125 mg twice daily, Norvasc 5 mg daily (half home dose).  Monitor with increased mobility.  Long-term BP goal normotensive.  Avoid hypotension/dehydration.  - 6/27: Hypertensive 170s overnight, neurology recommending higher BP goals due to orthostasis.  Will order as needed hydralazine 10 mg every 8 hours for SBP greater than 180, DBP greater than 110.  Teds and  abdominal binder ordered.     09/15/2022    4:44 AM 09/14/2022    7:48 PM 09/14/2022    6:14 PM  Vitals with BMI  Height   5\' 9"   Weight   220 lbs 7 oz  BMI   32.54  Systolic 155 171 161  Diastolic 57 53 54  Pulse 63 66 67    10.  Obesity.  BMI 33.52.  Dietary follow-up 11.  CKD IIIa, creatinine baseline 1.57-1.84.  Follow-up chemistries  -Creatinine 1.5 this a.m., at baseline, monitor  12.  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  Follow-up outpatient Dr Pamelia Hoit.  Continue Nolvadex. 13.  Hyperlipidemia.  Lipitor 80 mg daily 14.  Right shoulder pain             -Improved, continue Voltaren gel    15. Constipation.  No recorded bowel movement since admission.  Ordered senna 1 tab nightly, MiraLAX daily  LOS: 1 days A FACE TO FACE EVALUATION WAS PERFORMED  [name] 09/15/2022, 8:48 AM

## 2022-09-15 NOTE — Plan of Care (Signed)
Problem: RH Balance Goal: LTG Patient will maintain dynamic standing balance (PT) Description: LTG:  Patient will maintain dynamic standing balance with assistance during mobility activities (PT) Flowsheets (Taken 09/15/2022 1428) LTG: Pt will maintain dynamic standing balance during mobility activities with:: Supervision/Verbal cueing   Problem: Sit to Stand Goal: LTG:  Patient will perform sit to stand with assistance level (PT) Description: LTG:  Patient will perform sit to stand with assistance level (PT) Flowsheets (Taken 09/15/2022 1428) LTG: PT will perform sit to stand in preparation for functional mobility with assistance level: Supervision/Verbal cueing   Problem: RH Bed Mobility Goal: LTG Patient will perform bed mobility with assist (PT) Description: LTG: Patient will perform bed mobility with assistance, with/without cues (PT). Flowsheets (Taken 09/15/2022 1428) LTG: Pt will perform bed mobility with assistance level of: Independent with assistive device    Problem: RH Bed to Chair Transfers Goal: LTG Patient will perform bed/chair transfers w/assist (PT) Description: LTG: Patient will perform bed to chair transfers with assistance (PT). Flowsheets (Taken 09/15/2022 1428) LTG: Pt will perform Bed to Chair Transfers with assistance level: Supervision/Verbal cueing   Problem: RH Car Transfers Goal: LTG Patient will perform car transfers with assist (PT) Description: LTG: Patient will perform car transfers with assistance (PT). Flowsheets (Taken 09/15/2022 1428) LTG: Pt will perform car transfers with assist:: Supervision/Verbal cueing   Problem: RH Furniture Transfers Goal: LTG Patient will perform furniture transfers w/assist (OT/PT) Description: LTG: Patient will perform furniture transfers  with assistance (OT/PT). Flowsheets (Taken 09/15/2022 1428) LTG: Pt will perform furniture transfers with assist:: Supervision/Verbal cueing   Problem: RH Ambulation Goal: LTG Patient  will ambulate in home environment (PT) Description: LTG: Patient will ambulate in home environment, # of feet with assistance (PT). Flowsheets (Taken 09/15/2022 1428) LTG: Pt will ambulate in home environ  assist needed:: Supervision/Verbal cueing LTG: Ambulation distance in home environment: 50' Goal: LTG Patient will ambulate in community environment (PT) Description: LTG: Patient will ambulate in community environment, # of feet with assistance (PT). Flowsheets (Taken 09/15/2022 1428) LTG: Pt will ambulate in community environ  assist needed:: Supervision/Verbal cueing LTG: Ambulation distance in community environment: 150'   Problem: RH Wheelchair Mobility Goal: LTG Patient will propel w/c in community environment (PT) Description: LTG: Patient will propel wheelchair in community environment, # of feet with assist (PT) Flowsheets (Taken 09/15/2022 1428) LTG: Pt will propel w/c in community environ  assist needed:: Supervision/Verbal cueing Distance: wheelchair distance in controlled environment: 150   Problem: RH Stairs Goal: LTG Patient will ambulate up and down stairs w/assist (PT) Description: LTG: Patient will ambulate up and down # of stairs with assistance (PT) Flowsheets (Taken 09/15/2022 1428) LTG: Pt will ambulate up/down stairs assist needed:: Contact Guard/Touching assist LTG: Pt will  ambulate up and down number of stairs: 5

## 2022-09-15 NOTE — Progress Notes (Signed)
Patient information reviewed and entered into [facility] by Becky Monda Chastain, PPS coordinator. Information including medical coding, function ability, and quality indicators will be reviewed and updated through discharge.   

## 2022-09-15 NOTE — Evaluation (Signed)
Occupational Therapy Assessment and Plan  Patient Details  Name: [name] MRN: [id number] Date of Birth: [birth year]  OT Diagnosis: abnormal posture and muscle weakness (generalized) Rehab Potential: Rehab Potential (ACUTE ONLY): Good ELOS: 10-14   Today's Date: 09/15/2022 OT Individual Time: 8657-8469 OT Individual Time Calculation (min): 60 min     Hospital Problem: Principal Problem:   Infarction of left basal ganglia (HCC)   Past Medical History:  Past Medical History:  Diagnosis Date   AKI (acute kidney injury) (HCC) 08/09/2022   Breast cancer (HCC) 12/2019   left breast DCIS   CKD (chronic kidney disease) 06/07/2016   Stage 2, GFR 60-89 ml/min   Diabetic polyneuropathy 06/07/2016   Dyslipidemia    Endometrial adenocarcinoma 2012   Fever blister    Hyperlipidemia    Hypertension, essential 06/07/2016   Major depressive disorder    Mixed dyslipidemia 06/07/2016   MRSA infection 10/2019   left foot wound   Obstructive sleep apnea 06/07/2016   does not use CPAP   Peripheral neuropathy    Retinopathy    Right rib fracture 08/09/2022   Severe obesity    Skin ulcer of left foot with fat layer exposed 11/02/2016   Stroke    Asymptomatic, discovered via neuroimaging; small infarct in left parietal lobe; also concern for small b/l frontal infarcts   Tachycardia 07/19/2017   Traumatic rhabdomyolysis (HCC) 08/09/2022   Type 2 diabetes mellitus with hyperglycemia, with long-term current use of insulin 06/07/2016   Unilateral primary osteoarthritis, right knee 04/16/2019   UTI (urinary tract infection) 08/09/2022   Past Surgical History:  Past Surgical History:  Procedure Laterality Date   ADRENALECTOMY     BREAST LUMPECTOMY WITH RADIOACTIVE SEED LOCALIZATION Left 02/25/2020   Procedure: LEFT BREAST LUMPECTOMY WITH RADIOACTIVE SEED LOCALIZATION;  Surgeon: Holly Miyamoto, MD;  Location: [facility];  Service: General;  Laterality: Left;    BUNIONECTOMY     CARDIAC CATHETERIZATION     CERVICAL SPINE SURGERY     CHOLECYSTECTOMY     DILATION AND CURETTAGE OF UTERUS     LAPAROSCOPIC SALPINGOOPHERECTOMY     LOOP RECORDER INSERTION N/A 09/14/2022   Procedure: LOOP RECORDER INSERTION;  Surgeon: Holly Prude, MD;  Location: [facility];  Service: Cardiovascular;  Laterality: N/A;   THORACOTOMY     TONSILLECTOMY      Assessment & Plan Clinical Impression: [name] 72 y/o female presented 09/10/2023 for with acute onset of right-sided weakness. MRI showed acute infarct in the bilateral corona radiata and in the left parietal lobe without hemorrhage. MRA showed no intracranial large vessel occlusion or significant stenosis. CT angiogram of head and neck with no emergent vascular findings. Most recent echocardiogram with ejection fraction of 60 to 65% no wall motion abnormalities grade 1 diastolic dysfunction.   Patient currently requires mod with basic self-care skills secondary to muscle weakness, decreased coordination, decreased motor planning, and decreased standing balance, decreased postural control, and decreased balance strategies.  Prior to hospitalization, patient could complete ADLs with independent .  Patient will benefit from skilled intervention to decrease level of assist with basic self-care skills and increase independence with basic self-care skills prior to discharge home with care partner.  Anticipate patient will require 24 hour supervision and follow up outpatient.  OT - End of Session Endurance Deficit: Yes Endurance Deficit Description: Impaired endurance/activity tolerance with global deconditioning OT Assessment Rehab Potential (ACUTE ONLY): Good OT Patient demonstrates impairments in the following area(s): Balance;Endurance;Motor;Pain;Safety;Skin  Integrity OT Basic ADL's Functional Problem(s): Grooming;Bathing;Dressing;Toileting OT Advanced ADL's Functional Problem(s): Simple Meal  Preparation OT Transfers Functional Problem(s): Toilet;Tub/Shower OT Additional Impairment(s): None OT Plan OT Intensity: Minimum of 1-2 x/day, 45 to 90 minutes OT Frequency: 5 out of 7 days OT Duration/Estimated Length of Stay: 10-14 OT Treatment/Interventions: Balance/vestibular training;Discharge planning;Pain management;Functional electrical stimulation;Self Care/advanced ADL retraining;Therapeutic Activities;UE/LE Coordination activities;Cognitive remediation/compensation;Disease mangement/prevention;Functional mobility training;Patient/family education;Skin care/wound managment;Therapeutic Exercise;Visual/perceptual remediation/compensation;Community reintegration;DME/adaptive equipment instruction;Neuromuscular re-education;Psychosocial support;Splinting/orthotics;UE/LE Strength taining/ROM;Wheelchair propulsion/positioning OT Self Feeding Anticipated Outcome(s): Mod I OT Basic Self-Care Anticipated Outcome(s): Supervision OT Toileting Anticipated Outcome(s): Supervision OT Bathroom Transfers Anticipated Outcome(s): Supervision OT Recommendation Patient destination: Home Follow Up Recommendations: Outpatient OT Equipment Recommended: To be determined   OT Evaluation Precautions/Restrictions  Precautions Precautions: Fall Required Braces or Orthoses: Other Brace Other Brace: Left DARCO boot for chronic wound Restrictions Weight Bearing Restrictions: No General Chart Reviewed: Yes Family/Caregiver Present: No Vital Signs Therapy Vitals Temp: 97.6 F (36.4 C) Temp Source: Oral Pulse Rate: 61 Resp: 16 BP: (!) 158/66 Patient Position (if appropriate): Sitting Oxygen Therapy SpO2: 100 % O2 Device: Room Air Pain Pain Assessment Pain Scale: 0-10 Pain Score: 0-No pain Home Living/Prior Functioning Home Living Family/patient expects to be discharged to:: Private residence Living Arrangements: Other relatives Available Help at Discharge: Family, Available 24  hours/day Type of Home: House Home Access: Stairs to enter Secretary/administrator of Steps: 5 Entrance Stairs-Rails: Can reach both Home Layout: One level Bathroom Shower/Tub: Engineer, manufacturing systems: Standard Bathroom Accessibility: Yes Additional Comments: Bathroom is not accessible per patient report- She would furniture walk into/out of the bathroom  Lives With: Family (sister) IADL History Homemaking Responsibilities: No Current License: No Occupation: Retired Type of Occupation: Runner, broadcasting/film/video Leisure and Hobbies: enjoys drawing - reports loss of interest in activities over the years Prior Function Level of Independence: Requires assistive device for independence, Independent with transfers, Needs assistance with ADLs  Able to Take Stairs?: Yes Driving: No Vocation: Retired Gaffer: retired Runner, broadcasting/film/video Leisure: Hobbies-yes (Comment) Vision Baseline Vision/History: 1 Wears glasses;4 Cataracts Ability to See in Adequate Light: 0 Adequate Patient Visual Report: No change from baseline Vision Assessment?: No apparent visual deficits Perception  Perception: Within Functional Limits Praxis Praxis: Intact Cognition Cognition Overall Cognitive Status: Within Functional Limits for tasks assessed Arousal/Alertness: Awake/alert Orientation Level: Person;Place;Situation Person: Oriented Place: Oriented Situation: Oriented Memory: Appears intact Attention: Selective Sustained Attention: Appears intact Selective Attention: Appears intact Awareness: Appears intact Problem Solving: Appears intact Safety/Judgment: Appears intact Comments: Patient with history of depression, flat affect and fatigue Brief Interview for Mental Status (BIMS) Repetition of Three Words (First Attempt): 3 Temporal Orientation: Year: Correct Temporal Orientation: Month: Accurate within 5 days Temporal Orientation: Day: Correct Recall: "Sock": No, could not recall Recall: "Blue": Yes, no  cue required Recall: "Bed": Yes, no cue required BIMS Summary Score: 13 Sensation Sensation Light Touch: Impaired by Dief[place]f assessment Peripheral sensation comments: Peripheral neuropathy in B feet Light Touch Impaired Details: Impaired LLE;Impaired RLE Hot/Cold: Appears Intact Proprioception: Appears Intact Stereognosis: Not tested Additional Comments: B LE neuropathy to B knees, but equally diminished on R and L Coordination Diebel Motor Movements are Fluid and Coordinated: No Fine Motor Movements are Fluid and Coordinated: Yes Coordination and Movement Description: Global deconditioning leading to poor motor planning and coordination Motor  Motor Motor: Abnormal postural alignment and control;Hemiplegia Motor - Skilled Clinical Observations: Mild right hemi LE>UE  Trunk/Postural Assessment  Cervical Assessment Cervical Assessment: Exceptions to [facility] Thoracic Assessment Thoracic Assessment: Exceptions to [facility] Lumbar  Assessment Lumbar Assessment: Exceptions to [facility] Postural Control Postural Control: Deficits on evaluation Righting Reactions: Delayed and inadequate with posterior bias Protective Responses: Delayed  Balance Balance Balance Assessed: Yes Static Sitting Balance Static Sitting - Balance Support: Feet unsupported;Bilateral upper extremity supported Static Sitting - Level of Assistance: 5: Stand by assistance Dynamic Sitting Balance Dynamic Sitting - Balance Support: During functional activity;Feet unsupported Dynamic Sitting - Level of Assistance: 3: Mod assist Dynamic Sitting Balance - Compensations: keeps one hand in support Dynamic Sitting - Balance Activities: Lateral lean/weight shifting;Forward lean/weight shifting;Reaching for objects;Reaching across midline Static Standing Balance Static Standing - Balance Support: Bilateral upper extremity supported Static Standing - Level of Assistance: 4: Min assist;5: Stand by assistance Dynamic Standing  Balance Dynamic Standing - Balance Support: Bilateral upper extremity supported;During functional activity Dynamic Standing - Level of Assistance: 4: Min assist Dynamic Standing - Balance Activities: Lateral lean/weight shifting;Forward lean/weight shifting;Reaching for objects;Reaching across midline Extremity/Trunk Assessment RUE Assessment RUE Assessment: Exceptions to [facility] General Strength Comments: Pt has weakness in the R UE related to recent stroke LUE Assessment LUE Assessment: Within Functional Limits  Care Tool Care Tool Self Care Eating   Eating Assist Level: Independent with assistive device    Oral Care    Oral Care Assist Level: Independent with assistive device    Bathing   Body parts bathed by patient: Right arm;Left arm;Chest;Abdomen;Front perineal area;Right upper leg;Left upper leg;Face Body parts bathed by helper: Buttocks;Left lower leg;Right lower leg   Assist Level: Moderate Assistance - Patient 50 - 74%    Upper Body Dressing(including orthotics)   What is the patient wearing?: Pull over shirt   Assist Level: Minimal Assistance - Patient > 75%    Lower Body Dressing (excluding footwear)   What is the patient wearing?: Pants;Underwear/pull up Assist for lower body dressing: Maximal Assistance - Patient 25 - 49%    Putting on/Taking off footwear   What is the patient wearing?: Socks;Shoes;AFO Assist for footwear: Maximal Assistance - Patient 25 - 49%       Care Tool Toileting Toileting activity   Assist for toileting: Moderate Assistance - Patient 50 - 74%     Care Tool Bed Mobility Roll left and right activity   Roll left and right assist level: Minimal Assistance - Patient > 75%    Sit to lying activity   Sit to lying assist level: Moderate Assistance - Patient 50 - 74%    Lying to sitting on side of bed activity   Lying to sitting on side of bed assist level: the ability to move from lying on the back to sitting on the side of the bed with  no back support.: Minimal Assistance - Patient > 75%     Care Tool Transfers Sit to stand transfer   Sit to stand assist level: Minimal Assistance - Patient > 75%    Chair/bed transfer   Chair/bed transfer assist level: Minimal Assistance - Patient > 75%     Toilet transfer   Assist Level: Minimal Assistance - Patient > 75%     Care Tool Cognition  Expression of Ideas and Wants Expression of Ideas and Wants: 3. Some difficulty - exhibits some difficulty with expressing needs and ideas (e.g, some words or finishing thoughts) or speech is not clear  Understanding Verbal and Non-Verbal Content Understanding Verbal and Non-Verbal Content: 4. Understands (complex and basic) - clear comprehension without cues or repetitions   Memory/Recall Ability Memory/Recall Ability : Current season;Staff names and faces;That he  or she is in a hospital/hospital unit   Refer to Care Plan for Long Term Goals  SHORT TERM GOAL WEEK 1 OT Short Term Goal 1 (Week 1): Pt will complete UB bathing with (S) OT Short Term Goal 2 (Week 1): Pt will complete LB bathing with Min A OT Short Term Goal 3 (Week 1): Pt will complete UB dressing with (S) OT Short Term Goal 4 (Week 1): Pt will complete LB dressing with Min A OT Short Term Goal 5 (Week 1): Pt will complete toileting tasks with Min A  Recommendations for other services: None    Skilled Therapeutic Intervention ADL ADL Eating: Modified independent Where Assessed-Eating: Wheelchair Grooming: Independent Where Assessed-Grooming: Sitting at sink;Wheelchair Upper Body Bathing: Minimal assistance Where Assessed-Upper Body Bathing: Shower Lower Body Bathing: Moderate assistance Where Assessed-Lower Body Bathing: Shower Upper Body Dressing: Minimal assistance Where Assessed-Upper Body Dressing: Wheelchair Lower Body Dressing: Maximal assistance Where Assessed-Lower Body Dressing: Wheelchair Toileting: Moderate assistance;Minimal assistance Where  Assessed-Toileting: Teacher, adult education: Curator Method: Proofreader: Raised toilet seat Tub/Shower Transfer: Moderate assistance Tub/Shower Transfer Method: Ambulating Tub/Shower Equipment: Information systems manager with back Film/video editor: Moderate assistance Film/video editor Method: Manufacturing systems engineer with back ADL Comments: Pt performs tasks very slowly Mobility  Bed Mobility Bed Mobility: Rolling Right;Rolling Left;Supine to Sit;Sit to Supine Rolling Right: Minimal Assistance - Patient > 75% Rolling Left: Minimal Assistance - Patient > 75% Left Sidelying to Sit: Minimal Assistance - Patient >75% Supine to Sit: Minimal Assistance - Patient > 75% Sit to Supine: Moderate Assistance - Patient 50-74% Transfers Sit to Stand: Minimal Assistance - Patient > 75% Stand to Sit: Minimal Assistance - Patient > 75% OT Session/Intervention Skiled OT eval completeed.  OT POC created. OT goals set at (S).  Pt currently able to complete ADL tasks with Max A with LB dressing and bathing, Min A with functional mobility and Mod A with toileting tasks.  Pt left in room sitting in wheelchair.  Chair alarm in place, call light within reach and all needs met.   Discharge Criteria: Patient will be discharged from OT if patient refuses treatment 3 consecutive times without medical reason, if treatment goals not met, if there is a change in medical status, if patient makes no progress towards goals or if patient is discharged from hospital.  The above assessment, treatment plan, treatment alternatives and goals were discussed and mutually agreed upon: by patient  Liam Graham 09/15/2022, 3:03 PM

## 2022-09-15 NOTE — Telephone Encounter (Signed)
  Loop Recorder Follow up   Is patient connected to [facility]? Yes   Have steri-strips fallen off or been removed? No   Does the patient need in office follow up? No   Please continue to monitor your cardiac device site for redness, swelling, and drainage. Call the device clinic at [phone] if you experience these symptoms, fever/chills, or have questions about your device.   Remote monitoring is used to monitor your cardiac device from home. This monitoring is scheduled every month by our office. It allows [place] to keep an eye on the functioning of your device to ensure it is working properly.   Spoke to patients sister who is on DPR ~ [place]. States patient is at [facility] and aware of ILR and monitor details. Advised to call if they have questions or concerns.

## 2022-09-15 NOTE — Telephone Encounter (Signed)
-----   Message from [facility], [place] sent at 09/14/2022  1:48 PM EDT ----- BSci loop implanted today CL Cryptogenic stroke

## 2022-09-15 NOTE — Evaluation (Signed)
Physical Therapy Assessment and Plan  Patient Details  Name: [name] MRN: [id number] Date of Birth: Apr 29, 1950  PT Diagnosis: Abnormal posture, Abnormality of gait, Cognitive deficits, Coordination disorder, Difficulty walking, Impaired sensation, and Muscle weakness Rehab Potential: Good ELOS: 10-14 days   Today's Date: 09/15/2022 PT Individual Time: 1st Treatment Session: 0900-1000; 2nd Treatment Session: 1308-6578 PT Individual Time Calculation (min): 60 min; 82 min  Hospital Problem: Principal Problem:   Infarction of left basal ganglia (HCC)   Past Medical History:  Past Medical History:  Diagnosis Date   AKI (acute kidney injury) (HCC) 08/09/2022   Breast cancer (HCC) 12/2019   left breast DCIS   CKD (chronic kidney disease) 06/07/2016   Stage 2, GFR 60-89 ml/min   Diabetic polyneuropathy 06/07/2016   Dyslipidemia    Endometrial adenocarcinoma 2012   Fever blister    Hyperlipidemia    Hypertension, essential 06/07/2016   Major depressive disorder    Mixed dyslipidemia 06/07/2016   MRSA infection 10/2019   left foot wound   Obstructive sleep apnea 06/07/2016   does not use CPAP   Peripheral neuropathy    Retinopathy    Right rib fracture 08/09/2022   Severe obesity    Skin ulcer of left foot with fat layer exposed 11/02/2016   Stroke    Asymptomatic, discovered via neuroimaging; small infarct in left parietal lobe; also concern for small b/l frontal infarcts   Tachycardia 07/19/2017   Traumatic rhabdomyolysis (HCC) 08/09/2022   Type 2 diabetes mellitus with hyperglycemia, with long-term current use of insulin 06/07/2016   Unilateral primary osteoarthritis, right knee 04/16/2019   UTI (urinary tract infection) 08/09/2022   Past Surgical History:  Past Surgical History:  Procedure Laterality Date   ADRENALECTOMY     BREAST LUMPECTOMY WITH RADIOACTIVE SEED LOCALIZATION Left 02/25/2020   Procedure: LEFT BREAST LUMPECTOMY WITH RADIOACTIVE SEED  LOCALIZATION;  Surgeon: Abigail Miyamoto, MD;  Location: [facility];  Service: General;  Laterality: Left;   BUNIONECTOMY     CARDIAC CATHETERIZATION     CERVICAL SPINE SURGERY     CHOLECYSTECTOMY     DILATION AND CURETTAGE OF UTERUS     LAPAROSCOPIC SALPINGOOPHERECTOMY     LOOP RECORDER INSERTION N/A 09/14/2022   Procedure: LOOP RECORDER INSERTION;  Surgeon: Lanier Prude, MD;  Location: [facility];  Service: Cardiovascular;  Laterality: N/A;   THORACOTOMY     TONSILLECTOMY      Assessment & Plan Clinical Impression: Patient is a 72 year old right-handed female with history of ductal carcinoma in situ of left breast status post lumpectomy with radioactive seed implant 2021, CKD with creatinine 1.57-1.84, diabetes mellitus, hypertension, obesity with BMI 33.52, hyperlipidemia as well as history of right middle cerebral artery infarction maintained on aspirin and Plavix and received CIR 08/12/2022 - 08/25/2022 and discharged to home ambulating 150 feet. Presented 09/10/2023 for with acute onset of right-sided weakness. MRI showed acute infarct in the bilateral corona radiata and in the left parietal lobe without hemorrhage. MRA showed no intracranial large vessel occlusion or significant stenosis. CT angiogram of head and neck with no emergent vascular findings. Most recent echocardiogram with ejection fraction of 60 to 65% no wall motion abnormalities grade 1 diastolic dysfunction. Neurology consulted patient currently remains on aspirin as well as Plavix therapy x 3 weeks then Plavix alone. Lovenox added for DVT prophylaxis. Plan for loop recorder placement. Hospital course follow-up podiatry services for chronic diabetic foot ulcers bilaterally with wound care using Xeroform with  Mepilex bandage change daily. Patient reports having some right shoulder pain due to overuse, this is improved with Voltaren gel. Reports she had a small bowel movement this morning. Tolerating a  regular consistency diet. Therapy evaluations completed due to patient decreased functional mobility was admitted for a comprehensive rehab program.   Patient currently requires min with mobility secondary to muscle weakness, decreased cardiorespiratoy endurance, decreased coordination, delayed processing, and decreased standing balance, hemiplegia, and decreased balance strategies.  Prior to hospitalization, patient was supervision with mobility and lived with Family (Sister) in a House home.  Home access is 5Stairs to enter.  Patient will benefit from skilled PT intervention to maximize safe functional mobility, minimize fall risk, and decrease caregiver burden for planned discharge home with intermittent assist.  Anticipate patient will benefit from follow up [facility] at discharge.  PT - End of Session Activity Tolerance: Tolerates 30+ min activity with multiple rests Endurance Deficit: Yes Endurance Deficit Description: Impaired endurance/activity tolerance with global deconditioning PT Assessment Rehab Potential (ACUTE/IP ONLY): Good PT Barriers to Discharge: Wound Care;Insurance for SNF coverage;Home environment access/layout PT Patient demonstrates impairments in the following area(s): Balance;Behavior;Edema;Sensory;Endurance;Motor;Skin Integrity PT Transfers Functional Problem(s): Bed Mobility;Bed to Chair;Car PT Locomotion Functional Problem(s): Ambulation;Wheelchair Mobility;Stairs PT Plan PT Intensity: Minimum of 1-2 x/day ,45 to 90 minutes PT Frequency: 5 out of 7 days PT Duration Estimated Length of Stay: 10-14 days PT Treatment/Interventions: Ambulation/gait training;Balance/vestibular training;Cognitive remediation/compensation;Discharge planning;Disease management/prevention;DME/adaptive equipment instruction;Functional mobility training;Neuromuscular re-education;Pain management;Patient/family education;Skin care/wound management;Stair training;Therapeutic Activities;Therapeutic  Exercise;UE/LE Strength taining/ROM;UE/LE Coordination activities;Community reintegration;Psychosocial support;Splinting/orthotics;Visual/perceptual remediation/compensation;Wheelchair propulsion/positioning PT Transfers Anticipated Outcome(s): Supv with LRAD PT Locomotion Anticipated Outcome(s): Supv with LRAD PT Recommendation Recommendations for Other Services: Neuropsych consult;Therapeutic Recreation consult Therapeutic Recreation Interventions: Stress management Follow Up Recommendations: Home health PT Patient destination: Home Equipment Recommended: To be determined Equipment Details: TBD   PT Evaluation Precautions/Restrictions Precautions Precautions: Fall Required Braces or Orthoses: Other Brace Other Brace: Left DARCO boot for chronic wound Restrictions Weight Bearing Restrictions: No Pain No/Denies pain.  Pain Interference Pain Interference Pain Effect on Sleep: 1. Rarely or not at all Pain Interference with Therapy Activities: 1. Rarely or not at all Pain Interference with Day-to-Day Activities: 1. Rarely or not at all Home Living/Prior Functioning Home Living Available Help at Discharge: Family;Available 24 hours/day Type of Home: House Home Access: Stairs to enter [employer] of Steps: 5 Entrance Stairs-Rails: Can reach both (Patient reports she in unable to reach both HR) Home Layout: One level Bathroom Shower/Tub: Engineer, manufacturing systems: Standard (Commode over toilet) Bathroom Accessibility: Yes Additional Comments: Bathroom is not accessible per patient report- She would furniture walk into/out of the bathroom  Lives With: Family (Sister) Prior Function Level of Independence: Requires assistive device for independence;Independent with transfers;Needs assistance with ADLs  Able to Take Stairs?: Yes Driving: No Vocation: Retired Gaffer: retired Runner, broadcasting/film/video Leisure: Hobbies-yes (Comment) (Watching  television) Vision/Perception  Vision - History Ability to See in Adequate Light: 0 Adequate Perception Perception: Within Functional Limits Praxis Praxis: Intact  Cognition Overall Cognitive Status: Within Functional Limits for tasks assessed Arousal/Alertness: Lethargic Orientation Level: Oriented X4 Year: 2024 Month: June Day of Week: Correct Memory: Appears intact Awareness: Appears intact Safety/Judgment: Appears intact Comments: Patient with history of depression, flat affect and fatigue Sensation Sensation Light Touch: Impaired by Indelicato assessment Peripheral sensation comments: Peripheral neuropathy in B feet Light Touch Impaired Details: Impaired LLE;Impaired RLE Additional Comments: B LE neuropathy to B knees, but equally diminished on R and L Coordination [place]outhan Motor  Movements are Fluid and Coordinated: No Fine Motor Movements are Fluid and Coordinated: Yes Coordination and Movement Description: Global deconditioning leading to poor motor planning and coordination Motor  Motor Motor: Abnormal postural alignment and control;Hemiplegia Motor - Skilled Clinical Observations: Mild right hemi LE>UE   Trunk/Postural Assessment  Cervical Assessment Cervical Assessment: Exceptions to [facility] (Forward flexed head) Thoracic Assessment Thoracic Assessment: Exceptions to [facility] (Rounded shoulders) Lumbar Assessment Lumbar Assessment: Exceptions to [facility] (Posterior pelvic tilt) Postural Control Postural Control: Deficits on evaluation Righting Reactions: Delayed and inadequate with posterior bias Protective Responses: Delayed  Balance Balance Balance Assessed: Yes Static Sitting Balance Static Sitting - Balance Support: Feet unsupported;Bilateral upper extremity supported Static Sitting - Level of Assistance: 5: Stand by assistance Dynamic Sitting Balance Dynamic Sitting - Balance Support: During functional activity;Feet unsupported Dynamic Sitting - Level of Assistance: 3:  Mod assist Static Standing Balance Static Standing - Balance Support: Bilateral upper extremity supported Static Standing - Level of Assistance: 4: Min assist;5: Stand by assistance Dynamic Standing Balance Dynamic Standing - Balance Support: Bilateral upper extremity supported;During functional activity Dynamic Standing - Level of Assistance: 4: Min assist Extremity Assessment      RLE Assessment RLE Assessment: Exceptions to [facility] Strength Comments: Grossly 3/5 with decreased hip strength since last admission LLE Assessment LLE Assessment: Exceptions to [facility] Strength Comments: [place] 3/5 except hip fl 2+/5 which pt reports is consistent with PLOF > last 5 years  Care Tool Care Tool Bed Mobility Roll left and right activity   Roll left and right assist level: Minimal Assistance - Patient > 75%    Sit to lying activity   Sit to lying assist level: Moderate Assistance - Patient 50 - 74%    Lying to sitting on side of bed activity   Lying to sitting on side of bed assist level: the ability to move from lying on the back to sitting on the side of the bed with no back support.: Minimal Assistance - Patient > 75%     Care Tool Transfers Sit to stand transfer   Sit to stand assist level: Minimal Assistance - Patient > 75%    Chair/bed transfer   Chair/bed transfer assist level: Minimal Assistance - Patient > 75%     Toilet transfer   Assist Level: Minimal Assistance - Patient > 75%    Car transfer   Car transfer assist level: Minimal Assistance - Patient > 75%      Care Tool Locomotion Ambulation   Assist level: Minimal Assistance - Patient > 75% Assistive device: Walker-rolling Max distance: 15'  Walk 10 feet activity   Assist level: Minimal Assistance - Patient > 75% Assistive device: Walker-rolling   Walk 50 feet with 2 turns activity Walk 50 feet with 2 turns activity did not occur: Safety/medical concerns (Patient unable to ambulate >15' at this time  secondary to poor endurance/activity tolerance)      Walk 150 feet activity Walk 150 feet activity did not occur: Safety/medical concerns      Walk 10 feet on uneven surfaces activity Walk 10 feet on uneven surfaces activity did not occur: Safety/medical concerns      Stairs Stair activity did not occur: Safety/medical concerns (Patient unable to perform stair mobility at this time secondary to poor endurance/activity tolerance, global deconditioning and impaired strength)        Walk up/down 1 step activity Walk up/down 1 step or curb (drop down) activity did not occur: Safety/medical concerns  Walk up/down 4 steps activity Walk up/down 4 steps activity did not occur: Safety/medical concerns      Walk up/down 12 steps activity Walk up/down 12 steps activity did not occur: Safety/medical concerns      Pick up small objects from floor   Pick up small object from the floor assist level: Moderate Assistance - Patient 50 - 74% Pick up small object from the floor assistive device: Field seismologist Is the patient using a wheelchair?: Yes Type of Wheelchair: Manual   Wheelchair assist level: Dependent - Patient 0% Max wheelchair distance: Therapist pushed wheelchair due to poor endurance and shoulder pain  Wheel 50 feet with 2 turns activity   Assist Level: Dependent - Patient 0%  Wheel 150 feet activity   Assist Level: Dependent - Patient 0%    Refer to Care Plan for Long Term Goals  SHORT TERM GOAL WEEK 1 PT Short Term Goal 1 (Week 1): Patient will complete bed mobility with MinA PT Short Term Goal 2 (Week 1): Patient will complete bed/chair transfer with LRAD and CGA PT Short Term Goal 3 (Week 1): Patient will ambulate >50' with LRAD and CGA  Recommendations for other services: Neuropsych and Therapeutic Recreation  Kitchen group and Stress management  Skilled Therapeutic Intervention Mobility Bed Mobility Bed Mobility: Rolling Right;Rolling Left;Supine  to Sit;Sit to Supine Rolling Right: Minimal Assistance - Patient > 75% Rolling Left: Minimal Assistance - Patient > 75% Supine to Sit: Minimal Assistance - Patient > 75% Sit to Supine: Moderate Assistance - Patient 50-74% Transfers Transfers: Sit to Stand;Stand to Sit;Stand Pivot Transfers Sit to Stand: Minimal Assistance - Patient > 75% Stand to Sit: Minimal Assistance - Patient > 75% Stand Pivot Transfers: Minimal Assistance - Patient > 75% Stand Pivot Transfer Details: Verbal cues for safe use of DME/AE;Verbal cues for sequencing;Verbal cues for technique Transfer (Assistive device): Rolling walker Locomotion  Gait Ambulation: Yes Gait Assistance: Minimal Assistance - Patient > 75% Gait Distance (Feet): 15 Feet Assistive device: Rolling walker Gait Assistance Details: Verbal cues for sequencing;Verbal cues for precautions/safety;Verbal cues for safe use of DME/AE;Verbal cues for technique;Verbal cues for gait pattern Gait Gait: Yes Gait Pattern: Impaired Gait Pattern: Decreased step length - right;Decreased step length - left;Shuffle;Trunk flexed;Decreased stride length Gait velocity: Decreased Stairs / Additional Locomotion Stairs: No Wheelchair Mobility Wheelchair Mobility: Yes Wheelchair Assistance: Dependent - Patient 0% Wheelchair Parts Management: Needs assistance Distance: 150'  Skilled Intervention: 1st Treatment Session- Patient greeted supine in bed and agreeable to PT treatment session. Evaluation completed (see details above and below) with education on PT POC and goals and individual treatment initiated with focus on bed mobility, sitting balance, sit/stands, standing balance, transfers, gait, car transfers, endurance/activity tolerance, discharge planning, education, etc. Patient completed all functional mobility with the use of a RW and MinA for stability- Patient required increased time to complete all functional mobility secondary to global deconditioning and  poor endurance/activity tolerance with decreased strength (RLE>LLE). Patient left sitting on the toilet with walker in front of her, wheelchair to her left and NT present to assist with toileting.    2nd Treatment Session- Patient greeted sitting upright in wheelchair in her room and agreeable to PT treatment session. Patient requested to try and use the restroom prior to heading to the gym. Patient stood from wheelchair with RW and MinA for initiating standing- Patient then gait trained to/from her wheelchair and bathroom with MinA. MAX VC for improved RLE step length and foot clearance throughout  gait trial with minimal improvements noted. Patient required Min/ModA for clothing management with VC for improved postural extension and overall stability. Patient attempted to void, however unsuccessful. Patient wheeled to/from her room and day room for time management and energy conservation.   Patient participate in a dance activity in order to improve overall mood/affect, engagement in therapy and interacting with various other patient's in the gym. Patient used BUE/LE in order to follow the dance moves to each song. Patient noted to be singing along with the music and enjoying her time in therapy. This was a good activity not only to raise the patient's spirits, but also to improve overall endurance/activity tolerance, coordination and strength.   Patient returned to her room and stood from wheelchair with RW and MinA for initiating standing with VC for improved sequencing. Patient gait trained ~10' to sitting EOB with RW and MinA- Patient required increased time to complete secondary to fatigue and poor foot clearance/step length. Patient transitioned from sitting EOB to supine with ModA for BLE management secondary to weakness. Patient left supine in bed with bed alarm on, call bell within reach and all needs met.   Patient reported mild Rt shoulder pain with dance activity, however subsided with rest  and once supine in bed.    Discharge Criteria: Patient will be discharged from PT if patient refuses treatment 3 consecutive times without medical reason, if treatment goals not met, if there is a change in medical status, if patient makes no progress towards goals or if patient is discharged from hospital.  The above assessment, treatment plan, treatment alternatives and goals were discussed and mutually agreed upon: by patient  Holly  Azariel [name] 09/15/2022, 10:29 AM

## 2022-09-16 ENCOUNTER — Ambulatory Visit: Payer: PPO | Admitting: Podiatry

## 2022-09-16 DIAGNOSIS — I639 Cerebral infarction, unspecified: Secondary | ICD-10-CM | POA: Diagnosis not present

## 2022-09-16 LAB — GLUCOSE, CAPILLARY
Glucose-Capillary: 124 mg/dL — ABNORMAL HIGH (ref 70–99)
Glucose-Capillary: 202 mg/dL — ABNORMAL HIGH (ref 70–99)
Glucose-Capillary: 163 mg/dL — ABNORMAL HIGH (ref 70–99)
Glucose-Capillary: 171 mg/dL — ABNORMAL HIGH (ref 70–99)

## 2022-09-16 MED ORDER — AMLODIPINE BESYLATE 10 MG PO TABS
10.0000 mg | ORAL_TABLET | Freq: Every day | ORAL | Status: DC
  Administered 2022-09-17 – 2022-09-30 (×14): 10 mg via ORAL
  Filled 2022-09-16 (×13): qty 1
  Filled 2022-09-16: qty 2

## 2022-09-16 MED ORDER — OXYBUTYNIN CHLORIDE 5 MG PO TABS
2.5000 mg | ORAL_TABLET | Freq: Two times a day (BID) | ORAL | Status: DC
  Administered 2022-09-16 – 2022-09-21 (×12): 2.5 mg via ORAL
  Filled 2022-09-16 (×13): qty 0.5

## 2022-09-16 NOTE — Progress Notes (Signed)
[name], female   DOB: [birth year], 72 y.o.   MRN: [id number]  SW spoke with pt sister [name] to inform on ELOS, and SW will follow-up with updates after team conference.  Cecile Sheerer, MSW, LCSWA Office: [phone] Cell: [phone] Fax: [phone]

## 2022-09-16 NOTE — Care Management (Signed)
Inpatient Rehabilitation Center Individual Statement of Services  Patient Name:  [name]  Date:  09/16/2022  Welcome to the Inpatient Rehabilitation Center.  Our goal is to provide you with an individualized program based on your diagnosis and situation, designed to meet your specific needs.  With this comprehensive rehabilitation program, you will be expected to participate in at least 3 hours of rehabilitation therapies Monday-Friday, with modified therapy programming on the weekends.  Your rehabilitation program will include the following services:  Physical Therapy (PT), Occupational Therapy (OT), 24 hour per day rehabilitation nursing, Therapeutic Recreaction (TR), Psychology, Neuropsychology, Care Coordinator, Rehabilitation Medicine, Nutrition Services, Pharmacy Services, and Other  Weekly team conferences will be held on Tuesdays to discuss your progress.  Your Inpatient Rehabilitation Care Coordinator will talk with you frequently to get your input and to update you on team discussions.  Team conferences with you and your family in attendance may also be held.  Expected length of stay: 10-14 days    Overall anticipated outcome: Supervision  Depending on your progress and recovery, your program may change. Your Inpatient Rehabilitation Care Coordinator will coordinate services and will keep you informed of any changes. Your Inpatient Rehabilitation Care Coordinator's name and contact numbers are listed  below.  The following services may also be recommended but are not provided by the Inpatient Rehabilitation Center:  Driving Evaluations Home Health Rehabiltiation Services Outpatient Rehabilitation Services Vocational Rehabilitation   Arrangements will be made to provide these services after discharge if needed.  Arrangements include referral to agencies that provide these services.  Your insurance has been verified to be:  [employer]  Your primary doctor is:   Laurann Montana  Pertinent information will be shared with your doctor and your insurance company.  Inpatient Rehabilitation Care Coordinator:  Susie Cassette [phone] or ([phone]  Information discussed with and copy given to patient by: Gretchen Short, 09/16/2022, 1:26 PM

## 2022-09-16 NOTE — Progress Notes (Signed)
Physical Therapy Session Note  Patient Details  Name: [name] MRN: [id number] Date of Birth: 09-May-1950  Today's Date: 09/16/2022 PT Individual Time: 1st Treatment Session: 0700-0800; 2nd Treatment Session: 1020-1120 PT Individual Time Calculation (min): 60 min; 60 min  Short Term Goals: Week 1:  PT Short Term Goal 1 (Week 1): Patient will complete bed mobility with MinA PT Short Term Goal 2 (Week 1): Patient will complete bed/chair transfer with LRAD and CGA PT Short Term Goal 3 (Week 1): Patient will ambulate >50' with LRAD and CGA  Skilled Therapeutic Interventions/Progress Updates:  1st Treatment Session- Patient greeted supine asleep in bed, but easily arousable and agreeable to PT treatment session. Patient transitioned from semi-reclined in bed to sitting EOB with use of bed rail and significant amount of time required to complete with multiple attempts and SBA for safety. VC throughout for increased anterior weight shift. While sitting EOB, RN present to administer medication. Therapist donned personal compression socks, socks and tennis shoes for time management. Patient attempted to thread underwear and pants, however required Mod/MaxA with increased time. Patient was able to doff gown independently and don shirt with set-up assistance. Patient stood from EOB with RW and MinA for initiating standing- Patient demonstrates improve ability when using a rocking motion x3 in order to gain momentum for standing. Patient gait trained to/from the bathroom (~10') with RW and CGA/MinA- Multimodal cues for improved postural extension, stepping within the frame of the walker and increased step length/foot clearance (RLE>LLE). Therapist at times providing slight facilitation for increased Lt lateral weight shift in order to improve RLE swing phase of gait with moderate improvements noted. Patient required ModA for clothing management when pulling her clothing down, but only CGA for pulling her  pants and underwear up. Patient attempted to void, however was unsuccessful. Patient returned to a seated position in her wheelchair where she washed her face, brushed her hair and applied moisturizer. Patient left sitting upright in wheelchair with posey belt on, call bell within reach, breakfast tray in front and all needs met.    2nd Treatment Session- Patient greeted sitting upright in wheelchair in her room and agreeable to PT treatment session. Patient reports increased lethargy this morning and needing time to wake-up. Patient transported to the ortho gym via wheelchair for time management and energy conservation.   Patient performed various sit/stands throughout treatment session with RW and CGA/MinA for initiating standing- Patient demonstrated good recall of proper sequencing and hand placement, however needs increased time to complete.  Patient gait trained x40' and >100' (back to her room from ortho gym) with RW and CGA/MinA- VC throughout for improved postural extension/forward gaze, stepping within the frame of the RW and increased RLE step length and foot clearance with good improvements noted with cues, however unable to sustain as she fatigues. Increased time required to complete all gait trials secondary to decreased cadence. During the first gait trial, patient reported incontinent episode of urine (with NT notified and agreeable to charting).   Patient returned to her room and was able to perform clothing management prior to sitting on the toilet with CGA for safety. Patient sat on the toilet, however unsuccessful with voiding or having a BM. Therapist performed back pericare in order to ensure cleanliness while patient performed front pericare. Patient was able to manage clothing for pulling underwear and pants over her hips with CGA/MinA for improved stability/safety and VC for improved midline posturing. Patient ambulated ~10' to sitting EOB with RW and  CGA for safety. Patient was  able to transition from sitting EOB to supine with CGA/MinA for RLE, however patient attempted to place BLE into the bed with multiple attempts and increased time required. Patient left supine in bed with bed alarm on, call bell within reach and all needs met.     Therapy Documentation Precautions:  Precautions Precautions: Fall Required Braces or Orthoses: Other Brace Other Brace: Left DARCO boot for chronic wound Restrictions Weight Bearing Restrictions: No  Pain: 1st Treatment Session: No/Denies pain.   2nd Treatment Session: No/Denies pain, just fatigue.     Therapy/Group: Individual Therapy  Jenisse Vullo 09/16/2022, 6:41 AM

## 2022-09-16 NOTE — Progress Notes (Signed)
PROGRESS NOTE   Subjective/Complaints:  No events overnight.   Patient tired this a.m. after therapies, but overall doing well, feels that sessions went well this morning. Discussed using Prevalon boots to offload heels at nighttime; patient states they have been floating her heels off the bed with a pillow, and that she cannot get to the bathroom quickly enough if the Prevalon boots are on.  Encouraged her to wear them just at nighttime, okay to float heels during the daytime. Patient agreeable to trial of Ditropan for urinary urgency.  ROS:  +urinary urgency - ongoing  Denies fevers, chills, N/V, abdominal pain, constipation, diarrhea, SOB, cough, chest pain, new weakness or paraesthesias.    Objective:   EP PPM/ICD IMPLANT  Result Date: 09/14/2022 CONCLUSIONS:  1. Successful implantation of a implantable loop recorder for Cryptogenic stroke  2. No early apparent complications.   Recent Labs    09/14/22 1833 09/15/22 0609  WBC 8.2 6.5  HGB 9.9* 9.9*  HCT 29.9* 29.9*  PLT 226 221    Recent Labs    09/14/22 1833 09/15/22 0609  NA  --  137  K  --  3.9  CL  --  103  CO2  --  26  GLUCOSE  --  140*  BUN  --  41*  CREATININE 1.78* 1.52*  CALCIUM  --  8.8*     Intake/Output Summary (Last 24 hours) at 09/16/2022 0912 Last data filed at 09/15/2022 1700 [place] per 24 hour  Intake 944 ml  Output --  Net 944 ml         Physical Exam: Vital Signs Blood pressure (!) 154/61, pulse 67, temperature 98 F (36.7 C), temperature source Oral, resp. rate 18, height 5\' 9"  (1.753 m), weight 100 kg, SpO2 99 %. General:  No apparent distress.  Sitting up in bed, eating lunch. HEENT: EOMI sclera anicteric, oral mucosa pink and moist Neck: Supple without JVD or lymphadenopathy Heart: Reg rate and rhythm. No murmurs rubs or gallops Chest: CTA bilaterally without wheezes, rales, or rhonchi; no distress Abdomen: Soft,  non-tender, nondistended, bowel sounds positive. Extremities: 2+ nonpitting edema in bilateral lower extremities.   Psych: Pt's affect is flat, a little anxious appearing Skin: Warm and dry.   + heel wounds appear to be healing compared to last admission, left heel wound remains mildly open.  A Mepilex bilaterally + Stage II to coccyx on admission            Neuro:, Alert, oriented x 3 + Occasional word finding difficulties.   + Stocking glove neuropathy Strength bilateral lower extremities 3/5 hip flexion, 4-/5 knee extension, 4- out of 5 ankle PF and DF Left upper extremity 4 out of 5 shoulder abduction, elbow flexion and extension, finger flexion Right upper extremity 4- out of 5 shoulder abduction, 4 out of 5 elbow flexion and extension, finger flexion  Musculoskeletal:  Mild right shoulder tenderness to palpation, minimal pain with shoulder passive ROM-improved    Assessment/Plan: 1. Functional deficits which require 3+ hours per day of interdisciplinary therapy in a comprehensive inpatient rehab setting. Physiatrist is providing close team supervision and 24 hour management of active medical problems listed  below. Physiatrist and rehab team continue to assess barriers to discharge/monitor patient progress toward functional and medical goals  Care Tool:  Bathing    Body parts bathed by patient: Right arm, Left arm, Chest, Abdomen, Front perineal area, Right upper leg, Left upper leg, Face   Body parts bathed by helper: Buttocks, Left lower leg, Right lower leg     Bathing assist Assist Level: Moderate Assistance - Patient 50 - 74%     Upper Body Dressing/Undressing Upper body dressing   What is the patient wearing?: Pull over shirt    Upper body assist Assist Level: Minimal Assistance - Patient > 75%    Lower Body Dressing/Undressing Lower body dressing      What is the patient wearing?: Pants, Underwear/pull up     Lower body assist Assist for lower  body dressing: Maximal Assistance - Patient 25 - 49%     Toileting Toileting    Toileting assist Assist for toileting: Moderate Assistance - Patient 50 - 74%     Transfers Chair/bed transfer  Transfers assist     Chair/bed transfer assist level: Minimal Assistance - Patient > 75%     Locomotion Ambulation   Ambulation assist      Assist level: Minimal Assistance - Patient > 75% Assistive device: Walker-rolling Max distance: 15'   Walk 10 feet activity   Assist     Assist level: Minimal Assistance - Patient > 75% Assistive device: Walker-rolling   Walk 50 feet activity   Assist Walk 50 feet with 2 turns activity did not occur: Safety/medical concerns (Patient unable to ambulate >15' at this time secondary to poor endurance/activity tolerance)         Walk 150 feet activity   Assist Walk 150 feet activity did not occur: Safety/medical concerns         Walk 10 feet on uneven surface  activity   Assist Walk 10 feet on uneven surfaces activity did not occur: Safety/medical concerns         Wheelchair     Assist Is the patient using a wheelchair?: Yes Type of Wheelchair: Manual    Wheelchair assist level: Dependent - Patient 0% Max wheelchair distance: Therapist pushed wheelchair due to poor endurance and shoulder pain    Wheelchair 50 feet with 2 turns activity    Assist        Assist Level: Dependent - Patient 0%   Wheelchair 150 feet activity     Assist      Assist Level: Dependent - Patient 0%   Blood pressure (!) 154/61, pulse 67, temperature 98 F (36.7 C), temperature source Oral, resp. rate 18, height 5\' 9"  (1.753 m), weight 100 kg, SpO2 99 %.  1. Functional deficits secondary to left basal ganglia and left parietal small/punctate infarcts as well as history of right MCA infarction.  Scheduled for loop recorder prior to admission to CIR             -patient may shower             -ELOS/Goals: 7 to 10 days,  supervision with PT and OT             -Admit to CIR 2.  Antithrombotics: -DVT/anticoagulation:  Pharmaceutical: Lovenox             -antiplatelet therapy: Aspirin 81 mg daily and Plavix 75 mg daily x 3 weeks then Plavix alone 3. Pain Management: Voltaren gel 4 g 4 times daily, Tylenol as needed  4. Mood/Behavior/Sleep: Wellbutrin 300 mg daily, Lexapro 20 mg daily, melatonin as needed             -antipsychotic agents: N/A 5. Neuropsych/cognition: This patient is capable of making decisions on her own behalf. 6. Skin/Wound Care:/Bilateral diabetic foot ulcers.  Skin care as directed routine skin checks  -Mepilex to foot ulcers, Prevalon boots at nighttime  7. Fluids/Electrolytes/Nutrition: Routine in and outs with follow-up chemistries 8.  Diabetes mellitus with peripheral neuropathy.  Latest hemoglobin A1c 7.0.  NovoLog Mix 70/30 20 units twice daily             -Prior use of metformin, to restart if needed  -Monitor today, blood glucose mildly elevated overnight. Recent Labs    09/15/22 1635 09/15/22 2037 09/16/22 0615  GLUCAP 224* 210* 124*      9.  Hypertension.  Coreg 3.125 mg twice daily, Norvasc 5 mg daily (half home dose).  Monitor with increased mobility.  Long-term BP goal normotensive.  Avoid hypotension/dehydration.  - 6/27: Hypertensive 170s overnight, neurology recommending higher BP goals due to orthostasis.  Will order as needed hydralazine 10 mg every 8 hours for SBP greater than 180, DBP greater than 110.  Teds and abdominal binder ordered.   - 6/28: 150s to 170s today, BS adequate, increase Norvasc to 10 mg     09/16/2022    5:46 AM 09/15/2022    8:25 PM 09/15/2022    1:56 PM  Vitals with BMI  Systolic 154 175 161  Diastolic 61 61 66  Pulse 67 60 61    10.  Obesity.  BMI 33.52.  Dietary follow-up 11.  CKD IIIa, creatinine baseline 1.57-1.84.  Follow-up chemistries  -Creatinine 1.5 this a.m., at baseline, monitor  12.  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  Follow-up outpatient Dr Pamelia Hoit.  Continue Nolvadex. 13.  Hyperlipidemia.  Lipitor 80 mg daily 14.  Right shoulder pain             -Improved, continue Voltaren gel    15. Constipation.  No recorded bowel movement since admission.  Ordered senna 1 tab nightly, MiraLAX daily  - Small BM 6/28; monitor 16.  Urinary urgency.  Start Ditropan 2.5 mg twice daily.  PVRs for next 3 days.  LOS: 2 days A FACE TO FACE EVALUATION WAS PERFORMED  [name] 09/16/2022, 9:12 AM

## 2022-09-16 NOTE — Progress Notes (Signed)
Occupational Therapy Session Note  Patient Details  Name: [name] MRN: [id number] Date of Birth: [birth year]  Today's Date: 09/16/2022 Session 1 OT Individual Time: 7829-5621 OT Individual Time Calculation (min): 43 min   Session 2 OT Individual Time: 1355-1435 OT Individual Time Calculation (min): 40 min    Short Term Goals: Week 1:  OT Short Term Goal 1 (Week 1): Pt will complete UB bathing with (S) OT Short Term Goal 2 (Week 1): Pt will complete LB bathing with Min A OT Short Term Goal 3 (Week 1): Pt will complete UB dressing with (S) OT Short Term Goal 4 (Week 1): Pt will complete LB dressing with Min A OT Short Term Goal 5 (Week 1): Pt will complete toileting tasks with Min A  Skilled Therapeutic Interventions/Progress Updates: Session 1    Pt greeted seated in recliner working on breakfast. Pt having difficulty scooping food bolus and keeping it on fork. OT provided built up handle to improve grasp with R hand. Improved grasp. OT had pt then use spoon to help keep food bolus on utensil, with improvement. OT issued MOCA to screen for cognition as pt much slower this stay than last admission. Pt scored a 19/30 on MOCA with deficits in memory, orientation, and some executive functioning. Pt needed extended time to complete MOCA. Pt left seated in wc with alarm belt on, call bell in reach, and needs met.  Denies pain  Session 2 Pt greeted asleep in bed. Pt needed increased time to wake, but agreeable to OT treatment session. Pt completed bed mobility with HOB elevated and increased time, but overall close supervision. Once at EOB, pt reported need to use the bathroom. Pt needed mod A to get to standing w/ RW and posterior lean. Facilitation to achieve anterior weight shift. She then ambulated into bathroom w/ RW and min A. Pt noted to be incontinent of urine and had soaked through her pad. Encouraged pt to try a brief for now, but to continued working on continence. Pt sat on  toilet and voided more bladder and had successful BM. Pt able to doff soiled pants seated on toilet. Min A for hygiene to ensure pt was clean. Pt ambulated out of bathroom with RW and min A, then mod A for safety when turning to sit in wc. Pt donned clean pants seated in wc with mod A to thread pant legs, then min A to stand with one posterior LOB when pulling up pants requiring mod A to correct. Pt agreeable to stay up in wc and was left seated with alarm belt on, call bell in reach, and needs met.   Therapy Documentation Precautions:  Precautions Precautions: Fall Required Braces or Orthoses: Other Brace Other Brace: Left DARCO boot for chronic wound Restrictions Weight Bearing Restrictions: No Pain:  Denies pain  Therapy/Group: Individual Therapy  [name] 09/16/2022, 2:42 PM

## 2022-09-16 NOTE — Progress Notes (Signed)
Orthopedic Tech Progress Note Patient Details:  [name] 1950/12/18 [id number]  Ortho Devices Type of Ortho Device: Abdominal binder Ortho Device/Splint Interventions: Ordered      Bella Kennedy A Adasyn Mcadams 09/16/2022, 4:56 PM

## 2022-09-17 DIAGNOSIS — I639 Cerebral infarction, unspecified: Secondary | ICD-10-CM | POA: Diagnosis not present

## 2022-09-17 LAB — GLUCOSE, CAPILLARY
Glucose-Capillary: 141 mg/dL — ABNORMAL HIGH (ref 70–99)
Glucose-Capillary: 212 mg/dL — ABNORMAL HIGH (ref 70–99)
Glucose-Capillary: 131 mg/dL — ABNORMAL HIGH (ref 70–99)

## 2022-09-17 MED ORDER — INSULIN ASPART PROT & ASPART (70-30 MIX) 100 UNIT/ML ~~LOC~~ SUSP
22.0000 [IU] | Freq: Two times a day (BID) | SUBCUTANEOUS | Status: DC
  Administered 2022-09-17 – 2022-09-26 (×19): 22 [IU] via SUBCUTANEOUS

## 2022-09-17 NOTE — Progress Notes (Signed)
Physical Therapy Session Note  Patient Details  Name: [name] MRN: [id number] Date of Birth: [birth year]  Today's Date: 09/17/2022 PT Individual Time: 0700-0810 PT Individual Time Calculation (min): 70 min   Short Term Goals: Week 1:  PT Short Term Goal 1 (Week 1): Patient will complete bed mobility with MinA PT Short Term Goal 2 (Week 1): Patient will complete bed/chair transfer with LRAD and CGA PT Short Term Goal 3 (Week 1): Patient will ambulate >50' with LRAD and CGA  Skilled Therapeutic Interventions/Progress Updates:    Pt presents in bed working on breakfast requiring extra time for coordination with RUE for self feeding. Suggested to sit EOB for better positioning but pt declined at this time. Discussed overall goals, d/c plan, function level at home between admissions and pt's personal goals for rehab. Extra time to come to EOB with CGA for facilitation of movement. Pt with urgency for bathroom, min assist with extra time for sit > stand with RW and gait into and out of bathroom with cues for safe positioning of RW. Hygiene performed independently with min assist for functional balance. PT assisted with changing of brief due to some void in brief on the way to the toilet. Self dressing EOB with focus on use of hemi technique. Min assist for starting of threading pants - pt attempted initially and assist in standing for full pulling up as min assist for balance with RW for support and cues for trunk and Bilateral knee/hip extension. Total assist for donning of tedhose and shoes for time management and effort. Extra time with cues for sit > stand again with RW, decreased balance and required 2 attempts. Min assist stand step transfer with RW to w/c and pt propelled w/c to sink to set up for oral hygiene. Functional gait training with RW with focus on upright posture, increased Rt step length, and positioning of RW x 63' with min assist overall. End of session set up with all needs in  reach.   Therapy Documentation Precautions:  Precautions Precautions: Fall Required Braces or Orthoses: Other Brace Other Brace: Left DARCO boot for chronic wound Restrictions Weight Bearing Restrictions: No  Pain: Pain Assessment Pain Scale: 0-10 Pain Score: 0-No pain     Therapy/Group: Individual Therapy  Karolee Stamps Darrol Poke, PT, DPT, CBIS  09/17/2022, 8:12 AM

## 2022-09-17 NOTE — Progress Notes (Signed)
PROGRESS NOTE   Subjective/Complaints:   Slightly improved urinary urgency- having some urinary retention, however is able to void, per nursing and pt.   Pt also reports things "take longer than before".   Notes that's "aching all over" this AM  1st BM since CVA yesterday- doesn't feel constipated anymore.   Ate 100% tray   ROS:   Pt denies SOB, abd pain, CP, N/V/C/D, and vision changes  Except for HPI Objective:   No results found. Recent Labs    09/14/22 1833 09/15/22 0609  WBC 8.2 6.5  HGB 9.9* 9.9*  HCT 29.9* 29.9*  PLT 226 221   Recent Labs    09/14/22 1833 09/15/22 0609  NA  --  137  K  --  3.9  CL  --  103  CO2  --  26  GLUCOSE  --  140*  BUN  --  41*  CREATININE 1.78* 1.52*  CALCIUM  --  8.8*    Intake/Output Summary (Last 24 hours) at 09/17/2022 1542 Last data filed at 09/17/2022 1300 [place] per 24 hour  Intake 1310 ml  Output 200 ml  Net 1110 ml        Physical Exam: Vital Signs Blood pressure (!) 140/60, pulse 60, temperature 98.2 F (36.8 C), temperature source Oral, resp. rate 20, height 5\' 9"  (1.753 m), weight 100 kg, SpO2 99 %.    General: awake, alert, appropriate, sitting EOB working with PT to stand; did appear to stand/crouched before I left; NAD HENT: conjugate gaze; oropharynx moist CV: regular rate- borderline bradycardia; no JVD Pulmonary: CTA B/L; no W/R/R- good air movement GI: soft, NT, ND, (+)BS- normoactive  Psychiatric: appropriate- flat affect Neurological: Ox3  Extremities: 2+ nonpitting edema in bilateral lower extremities.   Psych: Pt's affect is flat, a little anxious appearing Skin: Warm and dry.   + heel wounds appear to be healing compared to last admission, left heel wound remains mildly open.  A Mepilex bilaterally + Stage II to coccyx on admission            Neuro:, Alert, oriented x 3 + Occasional word finding difficulties.   +  Stocking glove neuropathy Strength bilateral lower extremities 3/5 hip flexion, 4-/5 knee extension, 4- out of 5 ankle PF and DF Left upper extremity 4 out of 5 shoulder abduction, elbow flexion and extension, finger flexion Right upper extremity 4- out of 5 shoulder abduction, 4 out of 5 elbow flexion and extension, finger flexion  Musculoskeletal:  Mild right shoulder tenderness to palpation, minimal pain with shoulder passive ROM-improved    Assessment/Plan: 1. Functional deficits which require 3+ hours per day of interdisciplinary therapy in a comprehensive inpatient rehab setting. Physiatrist is providing close team supervision and 24 hour management of active medical problems listed below. Physiatrist and rehab team continue to assess barriers to discharge/monitor patient progress toward functional and medical goals  Care Tool:  Bathing    Body parts bathed by patient: Right arm, Left arm, Chest, Abdomen, Front perineal area, Right upper leg, Left upper leg, Face   Body parts bathed by helper: Buttocks, Left lower leg, Right lower leg     Bathing assist  Assist Level: Moderate Assistance - Patient 50 - 74%     Upper Body Dressing/Undressing Upper body dressing   What is the patient wearing?: Pull over shirt    Upper body assist Assist Level: Minimal Assistance - Patient > 75%    Lower Body Dressing/Undressing Lower body dressing      What is the patient wearing?: Pants, Underwear/pull up     Lower body assist Assist for lower body dressing: Maximal Assistance - Patient 25 - 49%     Toileting Toileting    Toileting assist Assist for toileting: Moderate Assistance - Patient 50 - 74%     Transfers Chair/bed transfer  Transfers assist     Chair/bed transfer assist level: Minimal Assistance - Patient > 75%     Locomotion Ambulation   Ambulation assist      Assist level: Minimal Assistance - Patient > 75% Assistive device: Walker-rolling Max  distance: 11'   Walk 10 feet activity   Assist     Assist level: Minimal Assistance - Patient > 75% Assistive device: Walker-rolling   Walk 50 feet activity   Assist Walk 50 feet with 2 turns activity did not occur: Safety/medical concerns (Patient unable to ambulate >15' at this time secondary to poor endurance/activity tolerance)  Assist level: Minimal Assistance - Patient > 75% Assistive device: Walker-rolling    Walk 150 feet activity   Assist Walk 150 feet activity did not occur: Safety/medical concerns         Walk 10 feet on uneven surface  activity   Assist Walk 10 feet on uneven surfaces activity did not occur: Safety/medical concerns         Wheelchair     Assist Is the patient using a wheelchair?: Yes Type of Wheelchair: Manual    Wheelchair assist level: Dependent - Patient 0% Max wheelchair distance: Therapist pushed wheelchair due to poor endurance and shoulder pain    Wheelchair 50 feet with 2 turns activity    Assist        Assist Level: Dependent - Patient 0%   Wheelchair 150 feet activity     Assist      Assist Level: Dependent - Patient 0%   Blood pressure (!) 140/60, pulse 60, temperature 98.2 F (36.8 C), temperature source Oral, resp. rate 20, height 5\' 9"  (1.753 m), weight 100 kg, SpO2 99 %.  1. Functional deficits secondary to left basal ganglia and left parietal small/punctate infarcts as well as history of right MCA infarction.  Scheduled for loop recorder prior to admission to CIR             -patient may shower             -ELOS/Goals: 7 to 10 days, supervision with PT and OT            Con't CIR PT and OT  2.  Antithrombotics: -DVT/anticoagulation:  Pharmaceutical: Lovenox             -antiplatelet therapy: Aspirin 81 mg daily and Plavix 75 mg daily x 3 weeks then Plavix alone 3. Pain Management: Voltaren gel 4 g 4 times daily, Tylenol as needed 4. Mood/Behavior/Sleep: Wellbutrin 300 mg daily, Lexapro 20  mg daily, melatonin as needed             -antipsychotic agents: N/A 5. Neuropsych/cognition: This patient is capable of making decisions on her own behalf. 6. Skin/Wound Care:/Bilateral diabetic foot ulcers.  Skin care as directed routine skin checks  -Mepilex to  foot ulcers, Prevalon boots at nighttime  7. Fluids/Electrolytes/Nutrition: Routine in and outs with follow-up chemistries 8.  Diabetes mellitus with peripheral neuropathy.  Latest hemoglobin A1c 7.0.  NovoLog Mix 70/30 20 units twice daily             -Prior use of metformin, to restart if needed  -Monitor today, blood glucose mildly elevated overnight.  6/29- will increase 70/30 to 22 units BID since running 140s to 212- since Cr elevated, don't feel comfortable starting Metformin.  Recent Labs    09/16/22 2104 09/17/22 0617 09/17/22 1117  GLUCAP 171* 141* 212*     9.  Hypertension.  Coreg 3.125 mg twice daily, Norvasc 5 mg daily (half home dose).  Monitor with increased mobility.  Long-term BP goal normotensive.  Avoid hypotension/dehydration.  - 6/27: Hypertensive 170s overnight, neurology recommending higher BP goals due to orthostasis.  Will order as needed hydralazine 10 mg every 8 hours for SBP greater than 180, DBP greater than 110.  Teds and abdominal binder ordered.   - 6/28: 150s to 170s today, BS adequate, increase Norvasc to 10 mg  6/29- still 140s-150 systolic- will give a few days to kick in more.     09/17/2022    2:00 PM 09/17/2022    3:55 AM 09/16/2022    8:19 PM  Vitals with BMI  Systolic 140 149 147  Diastolic 60 58 57  Pulse 60 59 66    10.  Obesity.  BMI 33.52.  Dietary follow-up 11.  CKD IIIa, creatinine baseline 1.57-1.84.  Follow-up chemistries  -Creatinine 1.5 this a.m., at baseline, monitor  12.  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  Follow-up outpatient Dr Pamelia Hoit.  Continue Nolvadex. 13.  Hyperlipidemia.  Lipitor 80 mg  daily 14.  Right shoulder pain             -Improved, continue Voltaren gel    15. Constipation.  No recorded bowel movement since admission.  Ordered senna 1 tab nightly, MiraLAX daily  - Small BM 6/28; monitor  First decent BM 6/28 per pt.   16.  Urinary urgency.  Start Ditropan 2.5 mg twice daily.  PVRs for next 3 days.  6/29- Pt's PVRs up to 200s- 369 and another value of 325cc-  Has not been cathed so far- has been able to void.     I spent a total of 51   minutes on total care today- >50% coordination of care- due to  Doing IPOC, as well as d/w PT about progress. Also complex medical issues with urinary issues- reviewed all values of BP and Bladder scans and labs.     LOS: 3 days A FACE TO FACE EVALUATION WAS PERFORMED  Abrea Henle 09/17/2022, 3:42 PM

## 2022-09-17 NOTE — IPOC Note (Signed)
Overall Plan of Care [facility]) Patient Details Name: [name] MRN: [id number] DOB: 03-27-1950  Admitting Diagnosis: Infarction of left basal ganglia [facility])  Hospital Problems: Principal Problem:   Infarction of left basal ganglia (HCC)     Functional Problem List: Nursing Bladder, Bowel, Skin Integrity, Edema, Endurance, Pain, Medication Management, Safety  PT Balance, Behavior, Edema, Sensory, Endurance, Motor, Skin Integrity  OT Balance, Endurance, Motor, Pain, Safety, Skin Integrity  SLP    TR         Basic ADL's: OT Grooming, Bathing, Dressing, Toileting     Advanced  ADL's: OT Simple Meal Preparation     Transfers: PT Bed Mobility, Bed to Chair, Car  OT Toilet, Tub/Shower     Locomotion: PT Ambulation, Psychologist, prison and probation services, Stairs     Additional Impairments: OT None  SLP        TR      Anticipated Outcomes Item Anticipated Outcome  Self Feeding Mod I  Swallowing      Basic self-care  Supervision  Toileting  Supervision   Bathroom Transfers Supervision  Bowel/Bladder  regain continence of B/B  Transfers  Supv with LRAD  Locomotion  Supv with LRAD  Communication     Cognition     Pain  less than 4  Safety/Judgment  no falls   Therapy Plan: PT Intensity: Minimum of 1-2 x/day ,45 to 90 minutes PT Frequency: 5 out of 7 days PT Duration Estimated Length of Stay: 10-14 days OT Intensity: Minimum of 1-2 x/day, 45 to 90 minutes OT Frequency: 5 out of 7 days OT Duration/Estimated Length of Stay: 10-14     Team Interventions: Nursing Interventions Patient/Family Education, Medication Management, Bladder Management, Bowel Management, Skin Care/Wound Management, Disease Management/Prevention, Pain Management, Discharge Planning  PT interventions Ambulation/gait training, Balance/vestibular training, Cognitive remediation/compensation, Discharge planning, Disease management/prevention, DME/adaptive equipment instruction, Functional mobility  training, Neuromuscular re-education, Pain management, Patient/family education, Skin care/wound management, Stair training, Therapeutic Activities, Therapeutic Exercise, UE/LE Strength taining/ROM, UE/LE Coordination activities, Community reintegration, Psychosocial support, Splinting/orthotics, Visual/perceptual remediation/compensation, Wheelchair propulsion/positioning  OT Interventions Warden/ranger, Discharge planning, Pain management, Functional electrical stimulation, Self Care/advanced ADL retraining, Therapeutic Activities, UE/LE Coordination activities, Cognitive remediation/compensation, Disease mangement/prevention, Functional mobility training, Patient/family education, Skin care/wound managment, Therapeutic Exercise, Visual/perceptual remediation/compensation, Firefighter, Fish farm manager, Neuromuscular re-education, Psychosocial support, Splinting/orthotics, UE/LE Strength taining/ROM, Wheelchair propulsion/positioning  SLP Interventions    TR Interventions    SW/CM Interventions Discharge Planning, Psychosocial Support, Patient/Family Education   Barriers to Discharge MD  Medical stability, Home enviroment access/loayout, Incontinence, Neurogenic bowel and bladder, Lack of/limited family support, and Weight  Nursing Decreased caregiver support, Home environment access/layout, Incontinence, Wound Care 2 level B/B main level with 5 ste rial on right/left  PT Wound Care, Insurance for SNF coverage, Home environment access/layout    OT      SLP      SW       Team Discharge Planning: Destination: PT-Home ,OT- Home , SLP-  Projected Follow-up: PT-Home health PT, OT-  Outpatient OT, SLP-  Projected Equipment Needs: PT-To be determined, OT- To be determined, SLP-  Equipment Details: PT-TBD, OT-  Patient/family involved in discharge planning: PT- Patient,  OT-Patient, SLP-   MD ELOS: 10-14 days Medical Rehab Prognosis:  Good Assessment:  The patient has been admitted for CIR therapies with the diagnosis of L BG stroke. The team will be addressing functional mobility, strength, stamina, balance, safety, adaptive techniques and equipment, self-care, bowel and bladder mgt, patient and caregiver  education. Goals have been set at supervision . Anticipated discharge destination is home.        See Team Conference Notes for weekly updates to the plan of care

## 2022-09-17 NOTE — Progress Notes (Signed)
Occupational Therapy Session Note  Patient Details  Name: [name] MRN: [id number] Date of Birth: 13-Jul-1950  Today's Date: 09/17/2022 OT Individual Time: 4098-1191 OT Individual Time Calculation (min): 75 min    Short Term Goals: Week 1:  OT Short Term Goal 1 (Week 1): Pt will complete UB bathing with (S) OT Short Term Goal 2 (Week 1): Pt will complete LB bathing with Min A OT Short Term Goal 3 (Week 1): Pt will complete UB dressing with (S) OT Short Term Goal 4 (Week 1): Pt will complete LB dressing with Min A OT Short Term Goal 5 (Week 1): Pt will complete toileting tasks with Min A  Skilled Therapeutic Interventions/Progress Updates:    Patient sitting at w/c LOF dressed and willing to participate.  The pt had no report of pain at the time of treatment, pt went on to indicatethat she rested pretty good. Patient was able to roll the w/c from her room to the gym with MinA.  The pt was able to complete the Scifit for UB for 15 minutes in duration with 1 rest break. The pt was able to transfer from the w/c to a chair using the RW 2X with rest breaks as needed. The pt required 1 rest break and was encouraged to perform relaxation breathing to improve compliance. The pt went on to complete UB exercises using a 1lb dowel  2sets of 10 for shld flexion and  horizontal abduction before saying that she needed to go to the rest room.  The pt was transported to her room and was able to transfer from the w/c to standing using the grab bar for placement on the commode.  The pt was able to side step to the commode using the grab bar and doff her brief and pant prior to lowering herself to the commode with MinA.  The pt was close S for hygiene and was able to transfer to the w/c with close S using the RW as well.  The pt was transported to her living quarters for washing her hands at sink LOF with s/u assist.  At the end of the session, the call light and bedside table were both within reach and all  additional needs were addressed.   Therapy Documentation Precautions:  Precautions Precautions: Fall Required Braces or Orthoses: Other Brace Other Brace: Left DARCO boot for chronic wound Restrictions Weight Bearing Restrictions: No   Therapy/Group: Individual Therapy  Lavona Mound 09/17/2022, 4:08 PM

## 2022-09-17 NOTE — Progress Notes (Signed)
Physical Therapy Session Note  Patient Details  Name: [name] MRN: [id number] Date of Birth: Sep 13, 1950  Today's Date: 09/17/2022 PT Individual Time: 1300-1340 PT Individual Time Calculation (min): 40 min   Short Term Goals: Week 1:  PT Short Term Goal 1 (Week 1): Patient will complete bed mobility with MinA PT Short Term Goal 2 (Week 1): Patient will complete bed/chair transfer with LRAD and CGA PT Short Term Goal 3 (Week 1): Patient will ambulate >50' with LRAD and CGA  Skilled Therapeutic Interventions/Progress Updates:    Chart reviewed and pt agreeable to therapy. Pt received seated in WC with no c/o pain. Also of note, pt significantly fatigued t/o session. Session focused on functional transfers and amb to promote safe home access. Pt initiated session with amb to toulet using MinA + RW. Pt required significant time for toileting and CGA for balance during pericare with totalA for brief change. Pt then completed blocked practice of amb of 81ft with CGA + RW + noted decreased stride length on RLE. Pt then required MinA to return to bed. At end of session, pt was left semi-reclined in bed with alarm engaged, nurse call bell and all needs in reach.     Therapy Documentation Precautions:  Precautions Precautions: Fall Required Braces or Orthoses: Other Brace Other Brace: Left DARCO boot for chronic wound Restrictions Weight Bearing Restrictions: No General:      Therapy/Group: Individual Therapy  Dionne Milo, PT, DPT 09/17/2022, 3:56 PM

## 2022-09-18 DIAGNOSIS — I639 Cerebral infarction, unspecified: Secondary | ICD-10-CM | POA: Diagnosis not present

## 2022-09-18 LAB — URINALYSIS, ROUTINE W REFLEX MICROSCOPIC
Bilirubin Urine: NEGATIVE
Glucose, UA: NEGATIVE mg/dL
Ketones, ur: NEGATIVE mg/dL
Nitrite: POSITIVE — AB
Protein, ur: 30 mg/dL — AB
Specific Gravity, Urine: 1.011 (ref 1.005–1.030)
WBC, UA: >50 WBC/hpf (ref 0–5)
pH: 5 (ref 5.0–8.0)

## 2022-09-18 LAB — GLUCOSE, CAPILLARY
Glucose-Capillary: 119 mg/dL — ABNORMAL HIGH (ref 70–99)
Glucose-Capillary: 269 mg/dL — ABNORMAL HIGH (ref 70–99)
Glucose-Capillary: 122 mg/dL — ABNORMAL HIGH (ref 70–99)
Glucose-Capillary: 124 mg/dL — ABNORMAL HIGH (ref 70–99)

## 2022-09-18 NOTE — Progress Notes (Signed)
PROGRESS NOTE   Subjective/Complaints:   Nurse reports that pt's urine malodorous and still retaining- usually in 200's.  Max was 280cc  pt reports very sleepy, but says it's been this way for "a few days".   ROS:    Pt denies SOB, abd pain, CP, N/V/C/D, and vision changes   Except for HPI Objective:   No results found. No results for input(s): "WBC", "HGB", "HCT", "PLT" in the last 72 hours.  No results for input(s): "NA", "K", "CL", "CO2", "GLUCOSE", "BUN", "CREATININE", "CALCIUM" in the last 72 hours.   Intake/Output Summary (Last 24 hours) at 09/18/2022 1040 Last data filed at 09/18/2022 0802 Schoeppner per 24 hour  Intake 477 ml  Output 200 ml  Net 277 ml        Physical Exam: Vital Signs Blood pressure (!) 144/62, pulse 63, temperature 97.6 F (36.4 C), resp. rate 19, height 5\' 9"  (1.753 m), weight 100 kg, SpO2 92 %.     General: awake, but somewhat sleepy- delayed responses; sitting up in bed; NAD HENT: conjugate gaze; oropharynx moist CV: regular rate; no JVD Pulmonary: CTA B/L; no W/R/R- good air movement GI: soft, NT, ND, (+)BS- normoactive Psychiatric: appropriate- but slightly slowed and sleepy Neurological: alert- sitting up in bed; but sleepy- stayed awake for discussion Extremities: 2+ nonpitting edema in bilateral lower extremities.   Psych: Pt's affect is flat, a little anxious appearing Skin: Warm and dry.   + heel wounds appear to be healing compared to last admission, left heel wound remains mildly open.  A Mepilex bilaterally + Stage II to coccyx on admission            Neuro:, Alert, oriented x 3 + Occasional word finding difficulties.   + Stocking glove neuropathy Strength bilateral lower extremities 3/5 hip flexion, 4-/5 knee extension, 4- out of 5 ankle PF and DF Left upper extremity 4 out of 5 shoulder abduction, elbow flexion and extension, finger flexion Right upper  extremity 4- out of 5 shoulder abduction, 4 out of 5 elbow flexion and extension, finger flexion  Musculoskeletal:  Mild right shoulder tenderness to palpation, minimal pain with shoulder passive ROM-improved    Assessment/Plan: 1. Functional deficits which require 3+ hours per day of interdisciplinary therapy in a comprehensive inpatient rehab setting. Physiatrist is providing close team supervision and 24 hour management of active medical problems listed below. Physiatrist and rehab team continue to assess barriers to discharge/monitor patient progress toward functional and medical goals  Care Tool:  Bathing    Body parts bathed by patient: Right arm, Left arm, Chest, Abdomen, Front perineal area, Right upper leg, Left upper leg, Face   Body parts bathed by helper: Buttocks, Left lower leg, Right lower leg     Bathing assist Assist Level: Moderate Assistance - Patient 50 - 74%     Upper Body Dressing/Undressing Upper body dressing   What is the patient wearing?: Pull over shirt    Upper body assist Assist Level: Minimal Assistance - Patient > 75%    Lower Body Dressing/Undressing Lower body dressing      What is the patient wearing?: Pants, Underwear/pull up     Lower  body assist Assist for lower body dressing: Maximal Assistance - Patient 25 - 49%     Toileting Toileting    Toileting assist Assist for toileting: Moderate Assistance - Patient 50 - 74%     Transfers Chair/bed transfer  Transfers assist     Chair/bed transfer assist level: Minimal Assistance - Patient > 75%     Locomotion Ambulation   Ambulation assist      Assist level: Minimal Assistance - Patient > 75% Assistive device: Walker-rolling Max distance: 70'   Walk 10 feet activity   Assist     Assist level: Minimal Assistance - Patient > 75% Assistive device: Walker-rolling   Walk 50 feet activity   Assist Walk 50 feet with 2 turns activity did not occur: Safety/medical  concerns (Patient unable to ambulate >15' at this time secondary to poor endurance/activity tolerance)  Assist level: Minimal Assistance - Patient > 75% Assistive device: Walker-rolling    Walk 150 feet activity   Assist Walk 150 feet activity did not occur: Safety/medical concerns         Walk 10 feet on uneven surface  activity   Assist Walk 10 feet on uneven surfaces activity did not occur: Safety/medical concerns         Wheelchair     Assist Is the patient using a wheelchair?: Yes Type of Wheelchair: Manual    Wheelchair assist level: Dependent - Patient 0% Max wheelchair distance: Therapist pushed wheelchair due to poor endurance and shoulder pain    Wheelchair 50 feet with 2 turns activity    Assist        Assist Level: Dependent - Patient 0%   Wheelchair 150 feet activity     Assist      Assist Level: Dependent - Patient 0%   Blood pressure (!) 144/62, pulse 63, temperature 97.6 F (36.4 C), resp. rate 19, height 5\' 9"  (1.753 m), weight 100 kg, SpO2 92 %.  1. Functional deficits secondary to left basal ganglia and left parietal small/punctate infarcts as well as history of right MCA infarction.  Scheduled for loop recorder prior to admission to CIR             -patient may shower             -ELOS/Goals: 7 to 10 days, supervision with PT and OT            Con't CIR PT and OT  2.  Antithrombotics: -DVT/anticoagulation:  Pharmaceutical: Lovenox             -antiplatelet therapy: Aspirin 81 mg daily and Plavix 75 mg daily x 3 weeks then Plavix alone 3. Pain Management: Voltaren gel 4 g 4 times daily, Tylenol as needed 4. Mood/Behavior/Sleep: Wellbutrin 300 mg daily, Lexapro 20 mg daily, melatonin as needed             -antipsychotic agents: N/A 5. Neuropsych/cognition: This patient is capable of making decisions on her own behalf. 6. Skin/Wound Care:/Bilateral diabetic foot ulcers.  Skin care as directed routine skin checks  -Mepilex to  foot ulcers, Prevalon boots at nighttime  7. Fluids/Electrolytes/Nutrition: Routine in and outs with follow-up chemistries 8.  Diabetes mellitus with peripheral neuropathy.  Latest hemoglobin A1c 7.0.  NovoLog Mix 70/30 20 units twice daily             -Prior use of metformin, to restart if needed  -Monitor today, blood glucose mildly elevated overnight.  6/29- will increase 70/30 to 22 units BID  since running 140s to 212- since Cr elevated, don't feel comfortable starting Metformin.   6/30- much better since increased 70/30- down to 110s-120s from 200's Recent Labs    09/17/22 1117 09/17/22 1611 09/18/22 0557  GLUCAP 212* 131* 119*     9.  Hypertension.  Coreg 3.125 mg twice daily, Norvasc 5 mg daily (half home dose).  Monitor with increased mobility.  Long-term BP goal normotensive.  Avoid hypotension/dehydration.  - 6/27: Hypertensive 170s overnight, neurology recommending higher BP goals due to orthostasis.  Will order as needed hydralazine 10 mg every 8 hours for SBP greater than 180, DBP greater than 110.  Teds and abdominal binder ordered.   - 6/28: 150s to 170s today, BS adequate, increase Norvasc to 10 mg  6/29-6/30- still 140s-150 systolic- will give a few days to kick in more.     09/18/2022    5:40 AM 09/17/2022    7:23 PM 09/17/2022    2:00 PM  Vitals with BMI  Systolic 144 143 161  Diastolic 62 51 60  Pulse 63 63 60    10.  Obesity.  BMI 33.52.  Dietary follow-up 11.  CKD IIIa, creatinine baseline 1.57-1.84.  Follow-up chemistries  -Creatinine 1.5 this a.m., at baseline, monitor  12.  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  Follow-up outpatient Dr Pamelia Hoit.  Continue Nolvadex. 13.  Hyperlipidemia.  Lipitor 80 mg daily 14.  Right shoulder pain             -Improved, continue Voltaren gel    15. Constipation.  No recorded bowel movement since admission.  Ordered senna 1 tab nightly, MiraLAX daily  -  Small BM 6/28; monitor  First decent BM 6/28 per pt.   16.  Urinary urgency.  Start Ditropan 2.5 mg twice daily.  PVRs for next 3 days.  6/29- Pt's PVRs up to 200s- 369 and another value of 325cc-  Has not been cathed so far- has been able to void.   6/30- will check U/A and Cx- since malodorous and still retaining- last checked 6/22 and had (+) Nitrites and small leuks- likely has become UTI? Pending at this time.    I spent a total of 41   minutes on total care today- >50% coordination of care- due to  D/w nursing last night about urine and bladder scans- also spoke with charge nurse about getting U/A_ can do via in/out cath if needed.   LOS: 4 days A FACE TO FACE EVALUATION WAS PERFORMED  Ramyah Pankowski 09/18/2022, 10:40 AM

## 2022-09-18 NOTE — Progress Notes (Signed)
Patient incontinent in brief last night, also up to toilet to attempt voiding each time. Bladder scans for and . Urine malodorous.

## 2022-09-18 NOTE — Progress Notes (Signed)
Occupational Therapy Session Note  Patient Details  Name: [name] MRN: [id number] Date of Birth: [birth year]  {CHL IP REHAB OT TIME CALCULATIONS:[id number]}   Short Term Goals: Week 1:  OT Short Term Goal 1 (Week 1): Pt will complete UB bathing with (S) OT Short Term Goal 2 (Week 1): Pt will complete LB bathing with Min A OT Short Term Goal 3 (Week 1): Pt will complete UB dressing with (S) OT Short Term Goal 4 (Week 1): Pt will complete LB dressing with Min A OT Short Term Goal 5 (Week 1): Pt will complete toileting tasks with Min A  Skilled Therapeutic Interventions/Progress Updates:    Patient agreeable to participate in OT session. Reports *** pain level.   Patient participated in skilled OT session focusing on ***. Therapist facilitated/assessed/developed/educated/integrated/elicited *** in order to improve/facilitate/promote    Therapy Documentation Precautions:  Precautions Precautions: Fall Required Braces or Orthoses: Other Brace Other Brace: Left DARCO boot for chronic wound Restrictions Weight Bearing Restrictions: No  Therapy/Group: Individual Therapy   Limmie Patricia, OTR/L,CBIS  Supplemental OT - [facility] Secure Chat Preferred   09/18/2022, 9:32 PM

## 2022-09-19 ENCOUNTER — Ambulatory Visit: Payer: PPO | Admitting: Occupational Therapy

## 2022-09-19 ENCOUNTER — Ambulatory Visit: Payer: PPO | Admitting: Physical Therapy

## 2022-09-19 DIAGNOSIS — I639 Cerebral infarction, unspecified: Secondary | ICD-10-CM | POA: Diagnosis not present

## 2022-09-19 LAB — GLUCOSE, CAPILLARY
Glucose-Capillary: 109 mg/dL — ABNORMAL HIGH (ref 70–99)
Glucose-Capillary: 288 mg/dL — ABNORMAL HIGH (ref 70–99)
Glucose-Capillary: 145 mg/dL — ABNORMAL HIGH (ref 70–99)
Glucose-Capillary: 127 mg/dL — ABNORMAL HIGH (ref 70–99)

## 2022-09-19 LAB — URINE CULTURE

## 2022-09-19 MED ORDER — CEPHALEXIN 250 MG PO CAPS
500.0000 mg | ORAL_CAPSULE | Freq: Two times a day (BID) | ORAL | Status: AC
  Administered 2022-09-19 – 2022-09-25 (×14): 500 mg via ORAL
  Filled 2022-09-19 (×15): qty 2

## 2022-09-19 MED ORDER — NEPRO/CARBSTEADY PO LIQD
237.0000 mL | Freq: Three times a day (TID) | ORAL | Status: DC
  Administered 2022-09-19 – 2022-09-28 (×20): 237 mL via ORAL

## 2022-09-19 NOTE — Progress Notes (Signed)
Physical Therapy Session Note  Patient Details  Name: [name] MRN: [id number] Date of Birth: [birth year]  Today's Date: 09/19/2022 PT Individual Time: 1st Treatment Session: 1000-1100; 2nd Treatment Session: 1415-1500 PT Individual Time Calculation (min): 60 min; 45 min  Short Term Goals: Week 1:  PT Short Term Goal 1 (Week 1): Patient will complete bed mobility with MinA PT Short Term Goal 2 (Week 1): Patient will complete bed/chair transfer with LRAD and CGA PT Short Term Goal 3 (Week 1): Patient will ambulate >50' with LRAD and CGA  Skilled Therapeutic Interventions/Progress Updates:  1st Treatment Session- Patient greeted supine in bed with RN present and agreeable to PT treatment session. Patient reported she did not use the call button when finished toileting and ambulated to the bed without her walker. Therapist spent time educating the patient on improved safety awareness and discussing potential cognitive deficits since current admission. Therapist threaded patient's pants for time management and then she stood from EOB with RW and CGA- While standing, therapist donned brief and pulled up pants for time management. Patient then performed stand pivot transfer to wheelchair with RW and CGA for safety. Patient brushed her hair while seated in the wheelchair. Patient wheeled to/from her room and rehab gym for time management and energy conservation. Patient requires increased time for all functional mobility secondary to impaired endurance/activity tolerance and delayed processing.   Patient performed various sit/stands with RW and CGA/SBA for safety- Minor VC for scooting her bottom forward and proper hand placement with good effort noted.   Patient gait trained x95' with RW and CGA for safety- VC throughout for improved postural extension and forward gaze, increased R step length and foot clearance with emphasis on heel-toe, as well as stepping within the frame of the walker.    Patient stood with CGA and RW while performing Rt foot taps to 4" step with 2.5# ankle weight donned to RLE- Patient performed 3 x 10 with seated rest break in between.   Patient's XXL ted hose were delivered from stores and therapist spent time donning them to BLE in order to improve edema.   Patient returned to her room and left sitting upright in wheelchair with posey belt on, call bell within reach and all needs met.    2nd Treatment Session- Patient greeted sitting upright in wheelchair in her room and agreeable to PT treatment session. Patient requesting to try using the bathroom prior to heading to the rehab gym. Patient stood from wheelchair with RW and CGA for safety- Patient ambulated to/from her wheelchair and the bathroom with RW and CGA for safety. VC for increased RLE step length and foot clearance throughout swing phase of gait with good improvements noted, however unable to sustain as she starts to turn. Patient attempted to void while sitting on the toilet, however was unsuccessful. Patient was able to manage brief and pants down, however required Min/ModA for pulling up brief and pants. Patient wheeled dependently to/from her room and main gym for time management and energy conservation.   Patient performed x10 sit/stands with SUE on the walker and other UE on the arm rest of the wheelchair in order to facilitate increased BLE activation compared to when patient stands with BUE on the arm rests of the wheelchair- Patient required a significant amount of time to complete with Mod/Max VC for proper hand placement, scooting forward prior to standing, increased anterior weight shift throughout stand and improved postural extension upon standing. Patient left sitting upright in  wheelchair in main gym and transitioned to OT care.    Therapy Documentation Precautions:  Precautions Precautions: Fall Required Braces or Orthoses: Other Brace Other Brace: Left DARCO boot for chronic  wound Restrictions Weight Bearing Restrictions: No  Pain: 1st Treatment Session- No/Denies pain.  2nd Treatment Session- No/Denies pain.     Therapy/Group: Individual Therapy  Lillyn Wieczorek 09/19/2022, 7:48 AM

## 2022-09-19 NOTE — Progress Notes (Signed)
Met with patient. Updated board. Wound care plan on computer. Discussed educational binder and that only included loop recorder information and stroke medications.  Asked about her CPAP. Reports that sold it to her brother. Encouraged her to try and get it back that not wearing will increase risk for another stroke. York Spaniel would see what she could do. All needs met. Call bell in reach.

## 2022-09-19 NOTE — Patient Instructions (Signed)
1) Seated Row   Sit up straight with elbows by your sides. Pull back with shoulders/elbows, keeping forearms straight, as if pulling back on the reins of a horse. Squeeze shoulder blades together. Repeat _10_times, __2-3__sets/day    2) Shoulder Elevation    Sit up straight with arms by your sides. Slowly bring your shoulders up towards your ears. Repeat_10-__times, __2-3__ sets/day   3) Shoulder rolls  Move your shoulders in a circular pattern as shown so that your are moving in an up, back and down direction. Perform small circles if needed for comfort. Repeat 10 times. 2-3 sets/day.

## 2022-09-19 NOTE — Progress Notes (Signed)
Occupational Therapy Session Note  Patient Details  Name: [name] MRN: [id number] Date of Birth: [birth year]  Today's Date: 09/19/2022 OT Individual Time: 0900-0930 OT Individual Time Calculation (min): 30 min    Short Term Goals: Week 1:  OT Short Term Goal 1 (Week 1): Pt will complete UB bathing with (S) OT Short Term Goal 2 (Week 1): Pt will complete LB bathing with Min A OT Short Term Goal 3 (Week 1): Pt will complete UB dressing with (S) OT Short Term Goal 4 (Week 1): Pt will complete LB dressing with Min A OT Short Term Goal 5 (Week 1): Pt will complete toileting tasks with Min A  Skilled Therapeutic Interventions/Progress Updates:    Pt received in bed and had a bleeding nail from tugging on her hangnail. Pt cleansed nail with antiseptic wipes and a bandaid applied.   Pt sat to EOB with supervision to engage in washing UB and donning shirt. Pt's hips not even on bed which caused her to lean to her R. She needed cues to square up her hips.  Pt was set up/S with UB self care. She needed to toilet.  Helped her don socks and shoes/DARCO. With cues to push up to stand from bed, pt stood with supervision to RW and then ambulated to toilet with CGA with RW.  Doffed brief and pt urinated. She washed her legs in sitting.  Pt requested to sit longer to have a BM.  OT session time over so informed RN and NT she was on toilet and pt is aware of how to use pull cord.  Pt also discussed the need to continue to live with her sister as she is aware she can not live alone.  XXL knee high compression hose ordered for pt.   Therapy Documentation Precautions:  Precautions Precautions: Fall Required Braces or Orthoses: Other Brace Other Brace: Left DARCO boot for chronic wound Restrictions Weight Bearing Restrictions: No  Pain: no c/o pain    ADL: ADL Eating: Modified independent Where Assessed-Eating: Wheelchair Grooming: Independent Where Assessed-Grooming: Sitting at sink,  Wheelchair Upper Body Bathing: Minimal assistance Where Assessed-Upper Body Bathing: Shower Lower Body Bathing: Moderate assistance Where Assessed-Lower Body Bathing: Shower Upper Body Dressing: Minimal assistance Where Assessed-Upper Body Dressing: Wheelchair Lower Body Dressing: Maximal assistance Where Assessed-Lower Body Dressing: Wheelchair Toileting: Moderate assistance, Minimal assistance Where Assessed-Toileting: Teacher, adult education: Curator Method: Proofreader: Raised toilet seat Tub/Shower Transfer: Moderate assistance Tub/Shower Transfer Method: Ambulating Tub/Shower Equipment: Information systems manager with back Film/video editor: Moderate assistance Film/video editor Method: Manufacturing systems engineer with back ADL Comments: Pt performs tasks very slowly   Therapy/Group: Individual Therapy  Gabreil [place] 09/19/2022, 8:58 AM

## 2022-09-19 NOTE — Plan of Care (Signed)
Wound Plan   Braden Score: 16   Sensory: 3  Moisture: 3  Activity: 3  Mobility: 3  Nutrition: 3  Friction: 1   Wounds present:   09/14/22 Left upper sternum (loop recorder) Attached edges No drainage (POA) 09/14/22 Open wound to Medial Coccyx-Fissure Unattached edges No drainage (POA) 09/14/22 Left diabetic ulcer posterior foot on sub-metatarsal surface Unattached edges No drainage (POA) 09/14/22 Left diabetic ulcer posterior heel Unattached edges No drainage (POA) 09/14/22 Right foot anterior lateral burst blister area now shallow pink open wound Attached edges No drainage (POA)     Interventions:   Antibiotic- Keflex SSI + 70/30 BID Vit C Zinc   Maintain Orthotic Device Abdominal Binder when OOB Prevalon Boots (Bilateral) TED hose on during the day/ off at night  Wound Care: Daily dressing changes to bilateral feet: Apply xeroform to wounds-Apply Melilex dressing overlying the xeroform and ulcers.   Attendees/contributors:  Dr Shearon Stalls Vedia Pereyra, RN

## 2022-09-19 NOTE — Progress Notes (Signed)
PROGRESS NOTE   Subjective/Complaints: No events overnight. Vitals stable Last BM 6/30  Patient endorses that her urinary urgency has improved somewhat with initiation of Ditropan.  She says that she has noticed people asking more often if she needs to use the bathroom, and notes that she is getting up fewer times at time night to go.  She does feel somewhat more cognitively slowed/foggy from last admission.  She denies any fevers, chills, abdominal pain, dysuria.  She does state that during prior UTIs, she has been largely asymptomatic.   ROS: Denies fevers, chills, N/V, abdominal pain, constipation, diarrhea, SOB, cough, chest pain, new weakness or paraesthesias.   + Cognitive deficit-worsening + Urinary urgency-improved + Right shoulder pain-endorsed to therapies   Except for HPI Objective:   No results found. No results for input(s): "WBC", "HGB", "HCT", "PLT" in the last 72 hours.  No results for input(s): "NA", "K", "CL", "CO2", "GLUCOSE", "BUN", "CREATININE", "CALCIUM" in the last 72 hours.   Intake/Output Summary (Last 24 hours) at 09/19/2022 0812 Last data filed at 09/19/2022 0622 [place] per 24 hour  Intake 911 ml  Output 1200 ml  Net -289 ml         Physical Exam: Vital Signs Blood pressure (!) 167/55, pulse 60, temperature 97.7 F (36.5 C), resp. rate 18, height 5\' 9"  (1.753 m), weight 100 kg, SpO2 98 %.     General: awake,alert- delayed responses; sitting up in bed; NAD HENT: conjugate gaze; oropharynx moist CV: regular rate; no JVD Pulmonary: CTA B/L; no W/R/R- good air movement GI: soft, NT, ND, (+)BS- normoactive Psychiatric: appropriate- but slightly slowed and sleepy Neurological: alert- sitting up in bed; but sleepy- stayed awake for discussion Extremities: 2+ nonpitting edema in bilateral lower extremities.   Psych: Pt's affect is flat, a little anxious appearing Skin: Warm and dry.   + heel  wounds appear to be healing compared to last admission, left heel wound remains mildly open.  A Mepilex bilaterally + Stage II to coccyx on admission            Neuro:, Alert, oriented x 3 - more lethargic than prior exams + Occasional word finding difficulties.   - improved + Stocking glove neuropathy Strength bilateral lower extremities 3/5 hip flexion, 4-/5 knee extension, 4- out of 5 ankle PF and DF Left upper extremity 4 out of 5 shoulder abduction, elbow flexion and extension, finger flexion Right upper extremity 4- out of 5 shoulder abduction, 4 out of 5 elbow flexion and extension, finger flexion  Musculoskeletal:  Mild right shoulder tenderness to palpation, minimal pain with shoulder passive ROM-ongoing    Assessment/Plan: 1. Functional deficits which require 3+ hours per day of interdisciplinary therapy in a comprehensive inpatient rehab setting. Physiatrist is providing close team supervision and 24 hour management of active medical problems listed below. Physiatrist and rehab team continue to assess barriers to discharge/monitor patient progress toward functional and medical goals  Care Tool:  Bathing    Body parts bathed by patient: Right arm, Left arm, Chest, Abdomen, Front perineal area, Right upper leg, Left upper leg, Face   Body parts bathed by helper: Buttocks, Left lower leg, Right lower  leg     Bathing assist Assist Level: Moderate Assistance - Patient 50 - 74%     Upper Body Dressing/Undressing Upper body dressing   What is the patient wearing?: Pull over shirt    Upper body assist Assist Level: Minimal Assistance - Patient > 75%    Lower Body Dressing/Undressing Lower body dressing      What is the patient wearing?: Pants, Underwear/pull up     Lower body assist Assist for lower body dressing: Maximal Assistance - Patient 25 - 49%     Toileting Toileting    Toileting assist Assist for toileting: Moderate Assistance - Patient 50 -  74%     Transfers Chair/bed transfer  Transfers assist     Chair/bed transfer assist level: Minimal Assistance - Patient > 75%     Locomotion Ambulation   Ambulation assist      Assist level: Minimal Assistance - Patient > 75% Assistive device: Walker-rolling Max distance: 34'   Walk 10 feet activity   Assist     Assist level: Minimal Assistance - Patient > 75% Assistive device: Walker-rolling   Walk 50 feet activity   Assist Walk 50 feet with 2 turns activity did not occur: Safety/medical concerns (Patient unable to ambulate >15' at this time secondary to poor endurance/activity tolerance)  Assist level: Minimal Assistance - Patient > 75% Assistive device: Walker-rolling    Walk 150 feet activity   Assist Walk 150 feet activity did not occur: Safety/medical concerns         Walk 10 feet on uneven surface  activity   Assist Walk 10 feet on uneven surfaces activity did not occur: Safety/medical concerns         Wheelchair     Assist Is the patient using a wheelchair?: Yes Type of Wheelchair: Manual    Wheelchair assist level: Dependent - Patient 0% Max wheelchair distance: Therapist pushed wheelchair due to poor endurance and shoulder pain    Wheelchair 50 feet with 2 turns activity    Assist        Assist Level: Dependent - Patient 0%   Wheelchair 150 feet activity     Assist      Assist Level: Dependent - Patient 0%   Blood pressure (!) 167/55, pulse 60, temperature 97.7 F (36.5 C), resp. rate 18, height 5\' 9"  (1.753 m), weight 100 kg, SpO2 98 %.  1. Functional deficits secondary to left basal ganglia and left parietal small/punctate infarcts as well as history of right MCA infarction.  Scheduled for loop recorder prior to admission to CIR             -patient may shower             -ELOS/Goals: 7 to 10 days, supervision with PT and OT            Con't CIR PT and OT  2.  Antithrombotics: -DVT/anticoagulation:   Pharmaceutical: Lovenox             -antiplatelet therapy: Aspirin 81 mg daily and Plavix 75 mg daily x 3 weeks then Plavix alone 3. Pain Management: Voltaren gel 4 g 4 times daily, Tylenol as needed.   - Added K-pad for right shoulder pain. 4. Mood/Behavior/Sleep: Wellbutrin 300 mg daily, Lexapro 20 mg daily, melatonin as needed             -antipsychotic agents: N/A 5. Neuropsych/cognition: This patient is capable of making decisions on her own behalf. 6. Skin/Wound Care:/Bilateral diabetic  foot ulcers.  Skin care as directed routine skin checks  -Mepilex to foot ulcers, Prevalon boots at nighttime  -Protein supplementation  7. Fluids/Electrolytes/Nutrition: Routine in and outs with follow-up chemistries 8.  Diabetes mellitus with peripheral neuropathy.  Latest hemoglobin A1c 7.0.  NovoLog Mix 70/30 20 units twice daily             -Prior use of metformin, to restart if needed  -Monitor today, blood glucose mildly elevated overnight.  6/29- will increase 70/30 to 22 units BID since running 140s to 212- since Cr elevated, don't feel comfortable starting Metformin.   6/30- much better since increased 70/30- down to 110s-120s from 200's; STABLE 7/1 Recent Labs    09/18/22 1617 09/18/22 2051 09/19/22 0610  GLUCAP 122* 124* 109*      9.  Hypertension.  Coreg 3.125 mg twice daily, Norvasc 5 mg daily (half home dose).  Monitor with increased mobility.  Long-term BP goal normotensive.  Avoid hypotension/dehydration.  - 6/27: Hypertensive 170s overnight, neurology recommending higher BP goals due to orthostasis.  Will order as needed hydralazine 10 mg every 8 hours for SBP greater than 180, DBP greater than 110.  Teds and abdominal binder ordered.   - 6/28: 150s to 170s today, BS adequate, increase Norvasc to 10 mg  6/29-7/1- still 140s-150 systolic- will give a few days to kick in more.     09/19/2022    3:34 AM 09/18/2022    7:59 PM 09/18/2022    1:00 PM  Vitals with BMI  Systolic 167 146  161  Diastolic 55 52 50  Pulse 60 63 64    10.  Obesity.  BMI 33.52.  Dietary follow-up 11.  CKD IIIa, creatinine baseline 1.57-1.84.  Follow-up chemistries  -Creatinine 1.5 this a.m., at baseline, monitor  12.  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  Follow-up outpatient Dr Pamelia Hoit.  Continue Nolvadex. 13.  Hyperlipidemia.  Lipitor 80 mg daily 14.  Right shoulder pain             -Improved, continue Voltaren gel    15. Constipation.  No recorded bowel movement since admission.  Ordered senna 1 tab nightly, MiraLAX daily  - Small BM 6/28; monitor  First decent BM 6/28 per pt.   - LBM 6/30, medium  16.  Urinary urgency.  Start Ditropan 2.5 mg twice daily.   6/29- Pt's PVRs up to 200s- 369 and another value of 325cc- Has not been cathed so far- has been able to void.   6/30- will check U/A and Cx- since malodorous and still retaining- last checked 6/22 and had (+) Nitrites and small leuks- likely has become UTI? Pending at this time.   7/1 - UA appears UTI, Cx pending, culture from 6-22 showing pansensitive E. Coli; initially determine asymptomatic bacteria, however patient was slowed cognition, increased confusion, and increased urinary urgency from last hospitalization.  Will start Keflex 500 mg BID for 7 days; allergic to Bactrim and Cipro. Still 200-300 on bladder scans.    LOS: 5 days A FACE TO FACE EVALUATION WAS PERFORMED  [name] 09/19/2022, 8:12 AM

## 2022-09-20 DIAGNOSIS — I639 Cerebral infarction, unspecified: Secondary | ICD-10-CM | POA: Diagnosis not present

## 2022-09-20 LAB — URINE CULTURE: Culture: >=100000 — AB

## 2022-09-20 LAB — GLUCOSE, CAPILLARY
Glucose-Capillary: 145 mg/dL — ABNORMAL HIGH (ref 70–99)
Glucose-Capillary: 196 mg/dL — ABNORMAL HIGH (ref 70–99)
Glucose-Capillary: 158 mg/dL — ABNORMAL HIGH (ref 70–99)
Glucose-Capillary: 180 mg/dL — ABNORMAL HIGH (ref 70–99)

## 2022-09-20 MED ORDER — ZINC SULFATE 220 (50 ZN) MG PO CAPS
220.0000 mg | ORAL_CAPSULE | Freq: Every day | ORAL | Status: DC
  Administered 2022-09-20 – 2022-09-30 (×11): 220 mg via ORAL
  Filled 2022-09-20 (×12): qty 1

## 2022-09-20 MED ORDER — BUPROPION HCL ER (SR) 100 MG PO TB12
200.0000 mg | ORAL_TABLET | Freq: Two times a day (BID) | ORAL | Status: DC
  Administered 2022-09-21 – 2022-09-27 (×13): 200 mg via ORAL
  Filled 2022-09-20 (×14): qty 2

## 2022-09-20 MED ORDER — LOSARTAN POTASSIUM 25 MG PO TABS
25.0000 mg | ORAL_TABLET | Freq: Every day | ORAL | Status: DC
  Administered 2022-09-20 – 2022-09-30 (×11): 25 mg via ORAL
  Filled 2022-09-20 (×12): qty 1

## 2022-09-20 MED ORDER — VITAMIN C 500 MG PO TABS
500.0000 mg | ORAL_TABLET | Freq: Every day | ORAL | Status: DC
  Administered 2022-09-20 – 2022-09-30 (×11): 500 mg via ORAL
  Filled 2022-09-20 (×12): qty 1

## 2022-09-20 NOTE — Progress Notes (Signed)
Physical Therapy Session Note  Patient Details  Name: [name] MRN: [id number] Date of Birth: 1950-11-05  Today's Date: 09/20/2022 PT Individual Time: 0915-1010 PT Individual Time Calculation (min): 55 min   Short Term Goals: Week 1:  PT Short Term Goal 1 (Week 1): Patient will complete bed mobility with MinA PT Short Term Goal 2 (Week 1): Patient will complete bed/chair transfer with LRAD and CGA PT Short Term Goal 3 (Week 1): Patient will ambulate >50' with LRAD and CGA  Skilled Therapeutic Interventions/Progress Updates:   Chart reviewed and pt agreeable to therapy. Pt received seated in WC with no c/o pain. Session focused on amb quality, activity tolerance, and bed mobility to promote safe home access. Pt initiated session with amb of 131ft using CGA + RW + increased time. Pt faded to S + RW but required return to [facility] for last 56ft 2/2 fatigue and increased RLE shuffling. Pt then completed on NuStep for interval training at workloads 2-8 with pt noted to need VC to maintain step speed. Pt then returned to room and practiced bed mobility with pt amb to bed with CGA + RW and required MinA + VC to place BLE in bed. Pt noted to require significant time to assist BLE lifting t/o session. At end of session, pt was left semi-reclined in bed with alarm engaged, nurse call bell and all needs in reach.     Therapy Documentation Precautions:  Precautions Precautions: Fall Required Braces or Orthoses: Other Brace Other Brace: Left DARCO boot for chronic wound Restrictions Weight Bearing Restrictions: No General:       Therapy/Group: Individual Therapy  Dionne Milo, PT, DPT 09/20/2022, 10:13 AM

## 2022-09-20 NOTE — Patient Care Conference (Signed)
Inpatient RehabilitationTeam Conference and Plan of Care Update Date: 09/20/2022   Time: 10:35 AM    Patient Name: [name]      Medical Record Number: [id number]  Date of Birth: [birth year] Sex: Female         Room/Bed: 4M13C/4M13C-01 Payor Info: Payor: [employer]na [employer] ADVANTAGE / Plan: [employer] PPO / Product Type: *No Product type* /    Admit Date/Time:  09/14/2022  6:04 PM  Primary Diagnosis:  Infarction of left basal ganglia [facility])  Hospital Problems: Principal Problem:   Infarction of left basal ganglia [facility])    Expected Discharge Date: Expected Discharge Date: 09/29/22  Team Members Present: Physician leading conference: Dr. Elijah Birk Social Worker Present: Cecile Sheerer, LCSWA Nurse Present: Vedia Pereyra, RN PT Present: Amedeo Plenty, PT OT Present: Jake Shark, OT PPS Coordinator present : Fae Pippin, SLP     Current Status/Progress Goal Weekly Team Focus  Bowel/Bladder   (P) Pt is continent B/B with some episodes of bladder incontinence. LBM 09/18/22   (P) Pt will regain full bladder incontinence   (P) Toilet pt q2-3 hr qshift/prn    Swallow/Nutrition/ Hydration               ADL's   (S) UB ADLs, CGA LB ADLs, CGA ADL transfers   Supervision   Activity tolerance, ADLs, dynamic balance    Mobility   MinA/Supv for bed mobility with use of bed rail; CGA/SBA for sit/stands, transfers and gait (up to 100') with RW- Limited by poor endurance/activity tolerance and decreased strength.   Supv with LRAD  Barriers: Limited carryover and safety awareness with poor endurance/activity tolerance. Continuing to focus on increased independence with functional mobility- Sit/stand progressions, transfers, short-distance gait, dynamic stability and R NMR.    Communication                Safety/Cognition/ Behavioral Observations               Pain   (P) Denies pain on this shift   (P) Will continue to be free from pain   (P) Assess pt  for pain qshift/prn    Skin   (P) Left heel ulcer and stage II to right heel.  Foam dressing in place   (P) Skin will be free from infection  (P) Promote healing and assess skin qshift/prn for breakdown      Discharge Planning:  D/c to her sister [name]'s home who will provide 24/7care . Sister needs her to be as independent as possible since she is not able provide physical care, and also caring for her husband. SW will confirm there are no barriers to discharge.   Team Discussion: Left basal ganglia infarct. Adding back blood pressure medications slowly. Keflex/UTI. Daily dressing changes to diabetic wounds with family education. Wound Plan in place. Time toileting to regain continence. PVRs have been low. Loop recorder site with steri strips. Therapies limited by carryover, poor safety awareness, heavy cueing and self limiting behaviors.   Patient on target to meet rehab goals: yes, progressing towards goals with discharge date of 09/29/22  *See Care Plan and progress notes for long and short-term goals.   Revisions to Treatment Plan:  Medication adjustments. Monitor labs and VS  Teaching Needs: Medications, safety, self care, skin/wound care, gait/transfer training, etc.   Current Barriers to Discharge: Decreased caregiver support, Incontinence, Wound care, and noncompliance with CPAP  Possible Resolutions to Barriers: Family education Regain control of bowel/bladder Independent with wound care Compliant with  CPAP use.      Medical Summary Current Status: medically complicated by UTI, urinary urgency, complex diabetic wounds, hypertension, obesity, peripheral edema, constipation and depressed mood  Barriers to Discharge: Behavior/Mood;Cardiac Complications;Complicated Wound;Electrolyte abnormality;Infection/IV Antibiotics;Medical stability;Morbid Obesity;Renal Insufficiency/Failure;Self-care education;Uncontrolled Hypertension  Barriers to Discharge Comments: titrating BP  medications slowly to avoid hypoperfusion, wound management, UTI/urinary urgency/frequency Possible Resolutions to [employer] Focus: Bladder scans and titrating urinary meidcations, treatment of UTI, medication adjustment for hypertension, nutritional support and pressure offloading for wound healing   Continued Need for Acute Rehabilitation Level of Care: The patient requires daily medical management by a physician with specialized training in physical medicine and rehabilitation for the following reasons: Direction of a multidisciplinary physical rehabilitation program to maximize functional independence : Yes Medical management of patient stability for increased activity during participation in an intensive rehabilitation regime.: Yes Analysis of laboratory values and/or radiology reports with any subsequent need for medication adjustment and/or medical intervention. : Yes   I attest that I was present, lead the team conference, and concur with the assessment and plan of the team.   Jearld Adjutant 09/20/2022, 2:23 PM

## 2022-09-20 NOTE — Progress Notes (Signed)
Physical Therapy Session Note  Patient Details  Name: [name] MRN: [id number] Date of Birth: 20-Jul-1950  Today's Date: 09/20/2022 PT Individual Time: 1300-1345 PT Individual Time Calculation (min): 45 min   Short Term Goals: Week 1:  PT Short Term Goal 1 (Week 1): Patient will complete bed mobility with MinA PT Short Term Goal 2 (Week 1): Patient will complete bed/chair transfer with LRAD and CGA PT Short Term Goal 3 (Week 1): Patient will ambulate >50' with LRAD and CGA  Skilled Therapeutic Interventions/Progress Updates:  Patient greeted supine in bed and agreeable to PT treatment session, however stating she "peed on herself" and needs to get cleaned up prior to heading to the rehab gym. Patient transitioned to sitting EOB with increased time and supv. Patient stood from EOB with RW and SBA for safety- Patient gait trained to/from EOB and bathroom with RW and CGA with VC for improved RLE clearance and step length. Patient with no void while sitting on the toilet, however she was incontinent in her brief. Patient was able to stand with SBA and perform front and back pericare. While sitting EOB, patient was able to thread B feet in her pants however required assistance for fully threading pants due to fatigue. Patient stood from EOB and pulled pants over her hips with CGA for safety and no LOB noted. Patient wheeled to/from her room and main gym for time management and energy conservation.   Patient gait trained x122' with RW and CGA for safety- VC for stepping within the frame of the walker, improved postural extension and increased Rt foot clearance and step length with emphasis on heel-toe pattern with good improvements noted however as she fatigues and becomes distracted requires increased cues. Patient required an extended seated rest break after gait trial secondary to reports of fatigue.   While seated, patient performed alternating marches as AROM with emphasis on increased height  and eccentric control, performed x20 on each LE.   Patient returned to her room and left sitting upright in wheelchair with call bell within reach and RN present. Patient transitioned to OT care.    Therapy Documentation Precautions:  Precautions Precautions: Fall Required Braces or Orthoses: Other Brace Other Brace: Left DARCO boot for chronic wound Restrictions Weight Bearing Restrictions: No  Pain: No/Denies pain.    Therapy/Group: Individual Therapy  Jhonnie Aliano 09/20/2022, 7:50 AM

## 2022-09-20 NOTE — Progress Notes (Signed)
PROGRESS NOTE   Subjective/Complaints: No events overnight.  Patient reports no acute complaints.  Overall, feeling tired, had 2 PT sessions this morning and 2 additional this afternoon scheduled. She endorses feeling increasingly tired, but denies any anhedonia, Riemann depression, or anxiety.  When discussing urination, she states that she has the urge to go and cannot control it until nursing arrives, but worries about feeling judged and therefore will be incontinent in her briefs sometimes and weight until she can go to the bathroom later.  Asked the patient if this is what she does at home, she states it is not, she is unsure why she is okay with being incontinent now.  Had a good discussion regarding general lethargy, poor motivation, and incidence of worsening depression as a result of multiple strokes.  Patient is agreeable to adjusting her antidepressant medication to assist in both motor recovery and mood at this time.   ROS: Denies fevers, chills, N/V, abdominal pain, constipation, diarrhea, SOB, cough, chest pain, new weakness or paraesthesias.   + Cognitive deficit-ongoing + Urinary urgency/incontinence-mildly improved with Ditropan, somewhat behavioral/poor motivation   Objective:   No results found. No results for input(s): "WBC", "HGB", "HCT", "PLT" in the last 72 hours.  No results for input(s): "NA", "K", "CL", "CO2", "GLUCOSE", "BUN", "CREATININE", "CALCIUM" in the last 72 hours.   Intake/Output Summary (Last 24 hours) at 09/20/2022 0855 Last data filed at 09/20/2022 0815 [place] per 24 hour  Intake 1549 ml  Output 200 ml  Net 1349 ml         Physical Exam: Vital Signs Blood pressure (!) 149/54, pulse 63, temperature 97.9 F (36.6 C), resp. rate 18, height 5\' 9"  (1.753 m), weight 100 kg, SpO2 98 %.     General: awake,alert-laying in bed between therapies. HENT: conjugate gaze; oropharynx moist CV: regular  rate; no JVD Pulmonary: CTA B/L; no W/R/R- good air movement GI: soft, NT, ND, (+)BS- normoactive Psychiatric: appropriate- but slightly slowed and sleepy Neurological: alert- sitting up in bed; but sleepy- stayed awake for discussion Extremities: 2+ nonpitting edema in bilateral lower extremities.  -Unchanged Psych: Pt's affect is flat, slightly tearful when discussing nursing cares and feeling judged for incontinent episodes. Skin: Warm and dry.   + heel wounds appear to be healing compared to last admission, left heel wound remains mildly open.  A Mepilex bilaterally + Stage II to coccyx on admission            Neuro:, Alert, oriented x 3 - more lethargic than prior exams + Occasional word finding difficulties.   - improved + Stocking glove neuropathy Strength bilateral lower extremities 3/5 hip flexion, 4-/5 knee extension, 4- out of 5 ankle PF and DF Left upper extremity 4 out of 5 shoulder abduction, elbow flexion and extension, finger flexion Right upper extremity 4- out of 5 shoulder abduction, 4 out of 5 elbow flexion and extension, finger flexion  Musculoskeletal:  No shoulder TTP    Assessment/Plan: 1. Functional deficits which require 3+ hours per day of interdisciplinary therapy in a comprehensive inpatient rehab setting. Physiatrist is providing close team supervision and 24 hour management of active medical problems listed below. Physiatrist and  rehab team continue to assess barriers to discharge/monitor patient progress toward functional and medical goals  Care Tool:  Bathing    Body parts bathed by patient: Right arm, Left arm, Chest, Abdomen, Front perineal area, Right upper leg, Left upper leg, Face   Body parts bathed by helper: Buttocks, Left lower leg, Right lower leg     Bathing assist Assist Level: Moderate Assistance - Patient 50 - 74%     Upper Body Dressing/Undressing Upper body dressing   What is the patient wearing?: Pull over shirt     Upper body assist Assist Level: Minimal Assistance - Patient > 75%    Lower Body Dressing/Undressing Lower body dressing      What is the patient wearing?: Pants, Underwear/pull up     Lower body assist Assist for lower body dressing: Maximal Assistance - Patient 25 - 49%     Toileting Toileting    Toileting assist Assist for toileting: Moderate Assistance - Patient 50 - 74%     Transfers Chair/bed transfer  Transfers assist     Chair/bed transfer assist level: Minimal Assistance - Patient > 75%     Locomotion Ambulation   Ambulation assist      Assist level: Minimal Assistance - Patient > 75% Assistive device: Walker-rolling Max distance: 2'   Walk 10 feet activity   Assist     Assist level: Minimal Assistance - Patient > 75% Assistive device: Walker-rolling   Walk 50 feet activity   Assist Walk 50 feet with 2 turns activity did not occur: Safety/medical concerns (Patient unable to ambulate >15' at this time secondary to poor endurance/activity tolerance)  Assist level: Minimal Assistance - Patient > 75% Assistive device: Walker-rolling    Walk 150 feet activity   Assist Walk 150 feet activity did not occur: Safety/medical concerns         Walk 10 feet on uneven surface  activity   Assist Walk 10 feet on uneven surfaces activity did not occur: Safety/medical concerns         Wheelchair     Assist Is the patient using a wheelchair?: Yes Type of Wheelchair: Manual    Wheelchair assist level: Dependent - Patient 0% Max wheelchair distance: Therapist pushed wheelchair due to poor endurance and shoulder pain    Wheelchair 50 feet with 2 turns activity    Assist        Assist Level: Dependent - Patient 0%   Wheelchair 150 feet activity     Assist      Assist Level: Dependent - Patient 0%   Blood pressure (!) 149/54, pulse 63, temperature 97.9 F (36.6 C), resp. rate 18, height 5\' 9"  (1.753 m), weight 100  kg, SpO2 98 %.  1. Functional deficits secondary to left basal ganglia and left parietal small/punctate infarcts as well as history of right MCA infarction.  Scheduled for loop recorder prior to admission to CIR             -patient may shower             -ELOS/Goals: 7 to 10 days, supervision with PT and OT goals - 7/11 DC date             - 7/2: SPV UBS, CGA LBD and other ADLs. For PT, can be limited in bed mobility due to motivation; is walking 100 ft with significant cueing for R heel-toe pattern; goals to remain SPV for short distances/   2.  Antithrombotics: -DVT/anticoagulation:  Pharmaceutical:  Lovenox             -antiplatelet therapy: Aspirin 81 mg daily and Plavix 75 mg daily x 3 weeks then Plavix alone 3. Pain Management: Voltaren gel 4 g 4 times daily, Tylenol as needed.   - Added K-pad for right shoulder pain. 4. Mood/Behavior/Sleep: Wellbutrin 300 mg daily, Lexapro 20 mg daily, melatonin as needed             -antipsychotic agents: N/A   - 7/2: Discussed with the patient, agreeable to increase antidepressant medications.  Already on max dose Lexapro, will transition from Wellbutrin 300 mg XR to Wellbutrin 200 mg SR twice daily.   5. Neuropsych/cognition: This patient is capable of making decisions on her own behalf. 6. Skin/Wound Care:/Bilateral diabetic foot ulcers.  Skin care as directed routine skin checks  -Mepilex to foot ulcers, Prevalon boots at nighttime  -Protein supplementation, daily Zinc 220 mg and Vitamin C 500 mg supplementation   7. Fluids/Electrolytes/Nutrition: Routine in and outs with follow-up chemistries 8.  Diabetes mellitus with peripheral neuropathy.  Latest hemoglobin A1c 7.0.  NovoLog Mix 70/30 20 units twice daily             -Prior use of metformin, to restart if needed  -Monitor today, blood glucose mildly elevated overnight.  6/29- will increase 70/30 to 22 units BID since running 140s to 212- since Cr elevated, don't feel comfortable starting  Metformin.   6/30- much better since increased 70/30- down to 110s-120s from 200's; STABLE 7/1 Recent Labs    09/19/22 1715 09/19/22 2152 09/20/22 0603  GLUCAP 145* 127* 145*      9.  Hypertension.  Coreg 3.125 mg twice daily, Norvasc 5 mg daily (half home dose).  Monitor with increased mobility.  Long-term BP goal normotensive.  Avoid hypotension/dehydration.  - 6/27: Hypertensive 170s overnight, neurology recommending higher BP goals due to orthostasis.  Will order as needed hydralazine 10 mg every 8 hours for SBP greater than 180, DBP greater than 110.  Teds and abdominal binder ordered.   - 6/28: 150s to 170s today, BS adequate, increase Norvasc to 10 mg   - 7/2: Resume Losartan at 25 mg daily    09/20/2022    5:00 AM 09/19/2022    7:43 PM 09/19/2022    1:28 PM  Vitals with BMI  Systolic 149 189 811  Diastolic 54 61 83  Pulse 63 67 65    10.  Obesity.  BMI 33.52.  Dietary follow-up 11.  CKD IIIa, creatinine baseline 1.57-1.84.  Follow-up chemistries  -Creatinine 1.5 this a.m., at baseline, monitor  12.  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  Follow-up outpatient Dr Pamelia Hoit.  Continue Nolvadex. 13.  Hyperlipidemia.  Lipitor 80 mg daily 14.  Right shoulder pain             -Improved, continue Voltaren gel    15. Constipation.  No recorded bowel movement since admission.  Ordered senna 1 tab nightly, MiraLAX daily  - Small BM 6/28; monitor  First decent BM 6/28 per pt.   - LBM 6/30, medium  16.  Urinary urgency.  Start Ditropan 2.5 mg twice daily.   6/29- Pt's PVRs up to 200s- 369 and another value of 325cc- Has not been cathed so far- has been able to void.   6/30- will check U/A and Cx- since malodorous and still retaining- last checked 6/22 and had (+) Nitrites and  small leuks- likely has become UTI? Pending at this time.   7/1 - UA appears UTI, Cx pending, culture from 6-22 showing pansensitive E. Coli;  initially determine asymptomatic bacteria, however patient was slowed cognition, increased confusion, and increased urinary urgency from last hospitalization.  Will start Keflex 500 mg BID for 7 days; allergic to Bactrim and Cipro. Still 200-300 on bladder scans.   17. OSA. Per patient recently sold her CPAP machine.  Discussed RT consult for inpatient use, patient refuses, states she was unable to tolerate it and is not willing to try again.  Will follow-up.   LOS: 6 days A FACE TO FACE EVALUATION WAS PERFORMED  [name] 09/20/2022, 8:55 AM

## 2022-09-20 NOTE — Progress Notes (Signed)
[name], female   DOB: 04/22/1950, 72 y.o.   MRN: [id number]  SW made efforts to meet with pt to provide updates, but pt care at time of visit. SW will follow-up.  1533- SW spoke with pt sister [name] to provide updates from team conference, and d/c date 7/11.When discussing outpatient therapies being recommended, she was concerned about it being a strain on her. SW discussed family edu, and after fam edu to make a decision. Fam edu scheduled for Monday (7/8) 8am-12pm.   Cecile Sheerer, MSW, LCSWA Office: [phone] Cell: [phone] Fax: [phone]

## 2022-09-20 NOTE — Progress Notes (Signed)
Physical Therapy Session Note  Patient Details  Name: [name] MRN: [id number] Date of Birth: September 21, 1950  Today's Date: 09/20/2022 PT Individual Time: 0807-0850 PT Individual Time Calculation (min): 43 min   Short Term Goals: Week 1:  PT Short Term Goal 1 (Week 1): Patient will complete bed mobility with MinA PT Short Term Goal 2 (Week 1): Patient will complete bed/chair transfer with LRAD and CGA PT Short Term Goal 3 (Week 1): Patient will ambulate >50' with LRAD and CGA  Skilled Therapeutic Interventions/Progress Updates:  Patient supine in bed on entrance to room. Patient alert and agreeable to PT session.   Patient with no pain complaint at start of session.  Therapeutic Activity: Bed Mobility: Pt performed supine <> sit with ***. VC/ tc required for ***. Transfers: Pt performed sit<>stand and stand pivot transfers throughout session with ***. Provided verbal cues for***.  Gait Training:  Pt ambulated *** ft using *** with ***. Demonstrated ***. Provided vc/ tc for ***.  Patient *** at end of session with brakes locked, *** alarm set, and all needs within reach.   Therapy Documentation Precautions:  Precautions Precautions: Fall Required Braces or Orthoses: Other Brace Other Brace: Left DARCO boot for chronic wound Restrictions Weight Bearing Restrictions: No General:   Vital Signs: Therapy Vitals Temp: 97.9 F (36.6 C) Pulse Rate: 63 Resp: 18 BP: (!) 149/54 Patient Position (if appropriate): Lying Oxygen Therapy SpO2: 98 % O2 Device: Room Air Pain:  No indication of pain throughout this session.   Therapy/Group: Individual Therapy  Loel Dubonnet PT, DPT, CSRS 09/20/2022, 8:53 AM

## 2022-09-20 NOTE — Progress Notes (Signed)
Occupational Therapy Session Note  Patient Details  Name: [name] MRN: [id number] Date of Birth: [birth year]  Today's Date: 09/20/2022 OT Individual Time: 1345-1430 OT Individual Time Calculation (min): 45 min    Short Term Goals: Week 1:  OT Short Term Goal 1 (Week 1): Pt will complete UB bathing with (S) OT Short Term Goal 2 (Week 1): Pt will complete LB bathing with Min A OT Short Term Goal 3 (Week 1): Pt will complete UB dressing with (S) OT Short Term Goal 4 (Week 1): Pt will complete LB dressing with Min A OT Short Term Goal 5 (Week 1): Pt will complete toileting tasks with Min A  Skilled Therapeutic Interventions/Progress Updates:    Pt resting in w/c upon arrival and agreeable to therapy. OT intervention with focus on sit<>stand and standing balance to increase independence with BADLs. Sit<>stand and standing balance with CGA participating in [facility] with rest breaks x3 and LOB X 1 with max A for safety. Pt stood at table to clean bean bags using BUE. Pt completed task seated in w/c. Pt returned to room and remained in w/c with all needs within reach. Belt alarm activated.   Therapy Documentation Precautions:  Precautions Precautions: Fall Required Braces or Orthoses: Other Brace Other Brace: Left DARCO boot for chronic wound Restrictions Weight Bearing Restrictions: No  Pain:  Pt denies pain this afternoon   Therapy/Group: Individual Therapy  Rich Brave 09/20/2022, 2:37 PM

## 2022-09-21 LAB — GLUCOSE, CAPILLARY
Glucose-Capillary: 117 mg/dL — ABNORMAL HIGH (ref 70–99)
Glucose-Capillary: 225 mg/dL — ABNORMAL HIGH (ref 70–99)
Glucose-Capillary: 175 mg/dL — ABNORMAL HIGH (ref 70–99)
Glucose-Capillary: 177 mg/dL — ABNORMAL HIGH (ref 70–99)

## 2022-09-21 LAB — CREATININE, SERUM
Creatinine, Ser: 1.62 mg/dL — ABNORMAL HIGH (ref 0.44–1.00)
GFR, Estimated: 34 mL/min — ABNORMAL LOW (ref >60)

## 2022-09-21 NOTE — Progress Notes (Signed)
PROGRESS NOTE   Subjective/Complaints: No events overnight.  Reports feeling tired this a.m., did have therapies earlier.  Still not agreeable to CPAP trial, despite ongoing daytime lethargy and poor sleep.  Agreeable to increase of Wellbutrin today.  Discussed incontinent episodes overnight; had talk with nurse tech this a.m., will be asking for assistance when needed.  ROS: Denies fevers, chills, N/V, abdominal pain, constipation, diarrhea, SOB, cough, chest pain, new weakness or paraesthesias.   + Cognitive deficit-ongoing + Urinary urgency/incontinence-mildly improved with Ditropan, somewhat behavioral/poor motivation + Fatigue/lethargy-ongoing  Objective:   No results found. No results for input(s): "WBC", "HGB", "HCT", "PLT" in the last 72 hours.  Recent Labs    09/21/22 0536  CREATININE 1.62*     Intake/Output Summary (Last 24 hours) at 09/21/2022 1303 Last data filed at 09/20/2022 1826 [place] per 24 hour  Intake 480 ml  Output --  Net 480 ml         Physical Exam: Vital Signs Blood pressure (!) 169/64, pulse (!) 59, temperature 98 F (36.7 C), resp. rate 17, height 5\' 9"  (1.753 m), weight 103 kg, SpO2 98 %.     General: awake,alert-laying in bed HENT: conjugate gaze; oropharynx moist CV: regular rate; no JVD Pulmonary: CTA B/L; no W/R/R- good air movement GI: soft, NT, ND, (+)BS- normoactive Psychiatric: appropriate- but slightly slowed and sleepy Neurological: alert- sitting up in bed; but sleepy- stayed awake for discussion Extremities: 2+ nonpitting edema in bilateral lower extremities.  -Unchanged Psych: Pt's affect is flat, mildly depressed.  Skin: Warm and dry.   + heel wounds appear to be healing compared to last admission, left heel wound remains mildly open.  A Mepilex bilaterally-appear to be healing 7-3 + Stage II to coccyx on admission            Neuro:, Alert, oriented x 3  -ongoing mild lethargy  + Occasional word finding difficulties.   - improved + Stocking glove neuropathy  Moving all 4 limbs antigravity and against resistance, 3+ out of 5 bilateral hip flexion, otherwise 4 out of 5 throughout     Assessment/Plan: 1. Functional deficits which require 3+ hours per day of interdisciplinary therapy in a comprehensive inpatient rehab setting. Physiatrist is providing close team supervision and 24 hour management of active medical problems listed below. Physiatrist and rehab team continue to assess barriers to discharge/monitor patient progress toward functional and medical goals  Care Tool:  Bathing    Body parts bathed by patient: Right arm, Left arm, Chest, Abdomen, Front perineal area, Right upper leg, Left upper leg, Face   Body parts bathed by helper: Buttocks, Left lower leg, Right lower leg     Bathing assist Assist Level: Moderate Assistance - Patient 50 - 74%     Upper Body Dressing/Undressing Upper body dressing   What is the patient wearing?: Pull over shirt    Upper body assist Assist Level: Minimal Assistance - Patient > 75%    Lower Body Dressing/Undressing Lower body dressing      What is the patient wearing?: Pants, Underwear/pull up     Lower body assist Assist for lower body dressing: Maximal Assistance - Patient 25 -  49%     Toileting Toileting    Toileting assist Assist for toileting: Moderate Assistance - Patient 50 - 74%     Transfers Chair/bed transfer  Transfers assist     Chair/bed transfer assist level: Minimal Assistance - Patient > 75%     Locomotion Ambulation   Ambulation assist      Assist level: Minimal Assistance - Patient > 75% Assistive device: Walker-rolling Max distance: 75'   Walk 10 feet activity   Assist     Assist level: Minimal Assistance - Patient > 75% Assistive device: Walker-rolling   Walk 50 feet activity   Assist Walk 50 feet with 2 turns activity did not  occur: Safety/medical concerns (Patient unable to ambulate >15' at this time secondary to poor endurance/activity tolerance)  Assist level: Minimal Assistance - Patient > 75% Assistive device: Walker-rolling    Walk 150 feet activity   Assist Walk 150 feet activity did not occur: Safety/medical concerns         Walk 10 feet on uneven surface  activity   Assist Walk 10 feet on uneven surfaces activity did not occur: Safety/medical concerns         Wheelchair     Assist Is the patient using a wheelchair?: Yes Type of Wheelchair: Manual    Wheelchair assist level: Dependent - Patient 0% Max wheelchair distance: Therapist pushed wheelchair due to poor endurance and shoulder pain    Wheelchair 50 feet with 2 turns activity    Assist        Assist Level: Dependent - Patient 0%   Wheelchair 150 feet activity     Assist      Assist Level: Dependent - Patient 0%   Blood pressure (!) 169/64, pulse (!) 59, temperature 98 F (36.7 C), resp. rate 17, height 5\' 9"  (1.753 m), weight 103 kg, SpO2 98 %.  1. Functional deficits secondary to left basal ganglia and left parietal small/punctate infarcts as well as history of right MCA infarction.  Scheduled for loop recorder prior to admission to CIR             -patient may shower             -ELOS/Goals: 7 to 10 days, supervision with PT and OT goals - 7/11 DC date             - 7/2: SPV UBS, CGA LBD and other ADLs. For PT, can be limited in bed mobility due to motivation; is walking 100 ft with significant cueing for R heel-toe pattern; goals to remain SPV for short distances/   2.  Antithrombotics: -DVT/anticoagulation:  Pharmaceutical: Lovenox             -antiplatelet therapy: Aspirin 81 mg daily and Plavix 75 mg daily x 3 weeks then Plavix alone 3. Pain Management: Voltaren gel 4 g 4 times daily, Tylenol as needed.   - Added K-pad for right shoulder pain. 4. Mood/Behavior/Sleep: Wellbutrin 300 mg daily,  Lexapro 20 mg daily, melatonin as needed             -antipsychotic agents: N/A   - 7/2: Discussed with the patient, agreeable to increase antidepressant medications.  Already on max dose Lexapro, will transition from Wellbutrin 300 mg XR to Wellbutrin 200 mg SR twice daily.   5. Neuropsych/cognition: This patient is capable of making decisions on her own behalf. 6. Skin/Wound Care:/Bilateral diabetic foot ulcers.  Skin care as directed routine skin checks  -Mepilex to foot  ulcers, Prevalon boots at nighttime  -Protein supplementation, daily Zinc 220 mg and Vitamin C 500 mg supplementation   7. Fluids/Electrolytes/Nutrition: Routine in and outs with follow-up chemistries 8.  Diabetes mellitus with peripheral neuropathy.  Latest hemoglobin A1c 7.0.  NovoLog Mix 70/30 20 units twice daily             -Prior use of metformin, to restart if needed  -Monitor today, blood glucose mildly elevated overnight.  6/29- will increase 70/30 to 22 units BID since running 140s to 212- since Cr elevated, don't feel comfortable starting Metformin.   6/30- much better since increased 70/30- down to 110s-120s from 200's; STABLE 7/1 Recent Labs    09/20/22 2132 09/21/22 0632 09/21/22 1151  GLUCAP 180* 117* 225*      9.  Hypertension.  Coreg 3.125 mg twice daily, Norvasc 5 mg daily (half home dose).  Monitor with increased mobility.  Long-term BP goal normotensive.  Avoid hypotension/dehydration.  - 6/27: Hypertensive 170s overnight, neurology recommending higher BP goals due to orthostasis.  Will order as needed hydralazine 10 mg every 8 hours for SBP greater than 180, DBP greater than 110.  Teds and abdominal binder ordered.   - 6/28: 150s to 170s today, BS adequate, increase Norvasc to 10 mg   - 7/2: Resume Losartan at 25 mg daily -will wait 1 to 2 days to see effect; had creatinine this a.m. 1.6 mildly elevated from last take but within baseline as below    09/21/2022   10:49 AM 09/21/2022    4:56 AM  09/20/2022    8:39 PM  Vitals with BMI  Weight 227 lbs 1 oz    BMI 33.52    Systolic  169 174  Diastolic  64 63  Pulse  59 67    10.  Obesity.  BMI 33.52.  Dietary follow-up 11.  CKD IIIa, creatinine baseline 1.57-1.84.  Follow-up chemistries  -Creatinine 1.5 this a.m., at baseline, monitor   12.  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  Follow-up outpatient Dr Pamelia Hoit.  Continue Nolvadex. 13.  Hyperlipidemia.  Lipitor 80 mg daily 14.  Right shoulder pain             -Improved, continue Voltaren gel    15. Constipation.  No recorded bowel movement since admission.  Ordered senna 1 tab nightly, MiraLAX daily  - Small BM 6/28; monitor  First decent BM 6/28 per pt.   - LBM 6/30, medium  16.  Urinary urgency.  Start Ditropan 2.5 mg twice daily.   6/29- Pt's PVRs up to 200s- 369 and another value of 325cc- Has not been cathed so far- has been able to void.   6/30- will check U/A and Cx- since malodorous and still retaining- last checked 6/22 and had (+) Nitrites and small leuks- likely has become UTI? Pending at this time.   7/1 - UA appears UTI, Cx pending, culture from 6-22 showing pansensitive E. Coli; initially determine asymptomatic bacteria, however patient was slowed cognition, increased confusion, and increased urinary urgency from last hospitalization.  Will start Keflex 500 mg BID for 7 days; allergic to Bactrim and Cipro. Still 200-300 on bladder scans.   7-3: Bladder scans lower overnight, but a few incontinent episodes.  Discussed with patient this a.m.  Urinary cultures Keflex sensitive.  17. OSA. Per patient recently sold her CPAP machine.  Discussed RT consult for inpatient use, patient refuses, states she was  unable to tolerate it and is not willing to try again.  Will follow-up.  -7-3: Still refusing CPAP, despite significant daytime lethargy.   LOS: 7 days A FACE TO FACE EVALUATION WAS  PERFORMED  [name] 09/21/2022, 1:03 PM

## 2022-09-21 NOTE — Progress Notes (Signed)
Physical Therapy Session Note  Patient Details  Name: ROCIO VANDERWOOD MRN: [id number] Date of Birth: 1950-10-19  Today's Date: 09/21/2022 PT Individual Time: 0900-1010 PT Individual Time Calculation (min): 70 min   Short Term Goals: Week 1:  PT Short Term Goal 1 (Week 1): Patient will complete bed mobility with MinA PT Short Term Goal 2 (Week 1): Patient will complete bed/chair transfer with LRAD and CGA PT Short Term Goal 3 (Week 1): Patient will ambulate >50' with LRAD and CGA  Skilled Therapeutic Interventions/Progress Updates:    Chart reviewed and pt agreeable to therapy. Pt received semi-reclined in bed with no c/o pain. Session focused on functional transfers during morning routine, dynamic standing balance and amb independence to promote safe mobility during self-care and home access. Pt initiated session with transfer to EOB and amb to toilet using S + RW + bed rails for supine>sit. Pt completed pericare with S + RW for balance. Pt required ModA to don new brief. Pt returned to bed S + RW and required set up A for donning shirt. Pt required MinA + increased time to donn pants. Pt noted to need max VC for motor planning to offload BLE when threading and pulling garment over foot. Pt required totalA to donn TED hose and socks. Pt then completed dynamic standing balance activities related to self-care tasks of face washing and teeth brushing in standing with S + RW. Pt then amb 55ft with S + RW. Pt given VC to increase LLE step length and educated on need to reduce shuffling as fall prevention. At end of session, pt was left seated in [facility] with alarm engaged, nurse call bell and all needs in reach.     Therapy Documentation Precautions:  Precautions Precautions: Fall Required Braces or Orthoses: Other Brace Other Brace: Left DARCO boot for chronic wound Restrictions Weight Bearing Restrictions: No General:      Therapy/Group: Individual Therapy  Dionne Milo, PT, DPT 09/21/2022,  11:13 AM

## 2022-09-21 NOTE — Progress Notes (Signed)
Occupational Therapy Session Note  Patient Details  Name: [name] MRN: [id number] Date of Birth: 02/04/1951  Today's Date: 09/21/2022 OT Individual Time: 4098-1191 OT Individual Time Calculation (min): 45 min    Short Term Goals: Week 1:  OT Short Term Goal 1 (Week 1): Pt will complete UB bathing with (S) OT Short Term Goal 2 (Week 1): Pt will complete LB bathing with Min A OT Short Term Goal 3 (Week 1): Pt will complete UB dressing with (S) OT Short Term Goal 4 (Week 1): Pt will complete LB dressing with Min A OT Short Term Goal 5 (Week 1): Pt will complete toileting tasks with Min A Week 2:     Skilled Therapeutic Interventions/Progress Updates:    1:1 Pt received in the w/c. Pt ambulated to the bathroom with RW with sit to stand with supervision but ambulation with min A with cues to take larger step with right foot to maintain balance. Pt transitioned into the shower with grab bar with extra time and cues for sequencing. Pt able to bathe all parts but feet. Pt performed sit to  stand for washing periarea and buttocks but difficulty with sustaining prolonged standing (with knees bending) requiring cues.   Pt dressed sitting in the w/c with setup and encouragement. Pt did require A to thread brief and pants and TEDS. Pt able to pull up pants but required extra time and cues for terminal extension of knees and hips. Grooming completed with setup.   2nd session 14:00-14:45 (45 min ) No pain in session but does report fatigue from earlier session.  1:1 Pt received falling asleep in the w/c. Discussed d/c plans and home safety around the home. Pt reports plans to go back to using her Rollator. Recommended she return to using the RW at home due to short steps and fall risk; the Rollator moves too quickly.  Pt still wanting to try Rollator - presented with one and tried to ambulate from bedside to room door but required therapist to manage device and pt not able to step through enough  with right LE and device moving forward too fast/ and too much. Pt understood and was appreciative of trial.  Pt reports fatigue and we decided that she can trial it again with another therapist. Pt transferred into the bed with min A for management of left LE. Performed LE exercises (straight leg raise, air bicycling, abduction/adduction, bridging and then with 3 lb weight shoulder flexion and bicep curls. Also performed core work reaching across body to perform modified sit up bilaterally.  Missed 15 due to ongoing fatigue.    Therapy Documentation Precautions:  Precautions Precautions: Fall Required Braces or Orthoses: Other Brace Other Brace: Left DARCO boot for chronic wound Restrictions Weight Bearing Restrictions: No  Pain: No c/o pain in session - 1st session    Therapy/Group: Individual Therapy  Roney Mans [facility] 09/21/2022, 3:18 PM

## 2022-09-21 NOTE — Progress Notes (Signed)
Physical Therapy Session Note  Patient Details  Name: [name] MRN: [id number] Date of Birth: 1950-04-19  Today's Date: 09/21/2022 PT Individual Time: 9562-1308 PT Individual Time Calculation (min): 29 min   Short Term Goals: Week 1:  PT Short Term Goal 1 (Week 1): Patient will complete bed mobility with MinA PT Short Term Goal 2 (Week 1): Patient will complete bed/chair transfer with LRAD and CGA PT Short Term Goal 3 (Week 1): Patient will ambulate >50' with LRAD and CGA  Skilled Therapeutic Interventions/Progress Updates:      Therapy Documentation Precautions:  Precautions Precautions: Fall Required Braces or Orthoses: Other Brace Other Brace: Left DARCO boot for chronic wound Restrictions Weight Bearing Restrictions: No  Pt agreeable to PT session and without reports of pain in session. Pt (S) with sit to stand and gait ~200 ft to main gym with cues for increased right foot clearance and step length. Pt with decreased hamstring activation contributing to poor foot clearance therefore pt performed 1 x 8 (12 inch) R LE step taps with tactile feedback for increased activation. PT positioned 1.5# ankle weight  to R LE and pt demonstrates increased foot clearance with gait to return to room (CGA due to fatigue). Pt left seated in w/c at bedside with all needs in reach and alarm on.     Therapy/Group: Individual Therapy  Truitt Leep Truitt Leep PT, DPT  09/21/2022, 5:49 AM

## 2022-09-22 LAB — GLUCOSE, CAPILLARY
Glucose-Capillary: 133 mg/dL — ABNORMAL HIGH (ref 70–99)
Glucose-Capillary: 249 mg/dL — ABNORMAL HIGH (ref 70–99)
Glucose-Capillary: 175 mg/dL — ABNORMAL HIGH (ref 70–99)

## 2022-09-22 MED ORDER — GUAIFENESIN 100 MG/5ML PO LIQD: 5.0000 mL | Freq: Four times a day (QID) | ORAL | Status: DC | PRN

## 2022-09-22 MED ORDER — MIRABEGRON ER 25 MG PO TB24
25.0000 mg | ORAL_TABLET | Freq: Every day | ORAL | Status: DC
  Administered 2022-09-23 – 2022-09-25 (×3): 25 mg via ORAL
  Filled 2022-09-22 (×4): qty 1

## 2022-09-22 NOTE — Progress Notes (Signed)
Physical Therapy Session Note  Patient Details  Name: [name] MRN: [id number] Date of Birth: [birth year]  Today's Date: 09/22/2022 PT Individual Time: 1st Treatment Session: [id number]; 2nd Treatment Session: 0865-7846 PT Individual Time Calculation (min): 45 min; 37 min  Short Term Goals: Week 1:  PT Short Term Goal 1 (Week 1): Patient will complete bed mobility with MinA PT Short Term Goal 2 (Week 1): Patient will complete bed/chair transfer with LRAD and CGA PT Short Term Goal 3 (Week 1): Patient will ambulate >50' with LRAD and CGA  Skilled Therapeutic Interventions/Progress Updates:  1st Treatment Session- Patient greeted supine in bed requesting to use the restroom and agreeable to PT treatment session. Patient transitioned to EOB without the use of the bed rail and Supv with increased time required to complete. Patient stood from EOB with RW and SBA with multiple attempts required and VC for improved sequencing with good improvements and ability noted. Patient gait trained to/from EOB and bathroom with RW and CGA/SBA for safety. Patient with continent void and BM- Patient was able to perform pericare, however therapist performed back pericare in order to ensure cleanliness. When ambulating back to the bed, patient attempted to ditch the walker to the side and required MAX cues for improved safety awareness. While sitting EOB, therapist donned ted hose and shoes/darco boot for time management. Patient threaded her pants with set-up assistance and a significant amount of time required to complete. Patient stood from EOB with RW and SBA- While standing, patient pulled pants over hips with CGA for safety/stability. Patient returned to a seated position to don shirt. Patient stood and performed stand pivot transfer to wheelchair with RW and CGA. While seated in the wheelchair, patient donned deodorant, brushed her hair and brushed her teeth. Patient left sitting upright in wheelchair with posey  belt on, call bell within reach and all needs met- RN present administering morning medications.   2nd Treatment Session- Patient greeted sitting upright in wheelchair in her room and agreeable to PT treatment session. Patient wheeled to/from her room and main gym for time management and energy conservation. Patient ascended/descended x4 steps with BHR and CGA/MinA- Patient utilized a step-to pattern and ascended with RLE leading and descended with LLE leading. While descending the stairs, patient required VC for improved anterior weight shift and postural extension as patient started to lean posteriorly and flex at her hips and knees secondary to fatigue and weakness. Patient required an extended seated rest break secondary to fatigue post stair mobility. Patient gait trained ~140' with RW and CGA/MinA- Patient required VC for stepping within the frame of the walker, increased RLE step length and foot clearance with emphasis on heel-toe gait pattern, and improved postural extension/forward gaze. Patient demonstrated good improvements with VC, however as she fatigued required x2 standing rest breaks to re-group and improve gait mechanics. Patient returned to her room and performed sit/stand and stand pivot transfer from wheelchair to EOB with RW and CGA/SBA. Patient transitioned to supine with CGA/MinA for LLE management due to weakness. Patient's sister, [name], present and therapist discussed CLOF, as well as family training scheduled for Monday. Patient left supine in bed with call bell within reach, bed alarm on and all needs met.    Therapy Documentation Precautions:  Precautions Precautions: Fall Required Braces or Orthoses: Other Brace Other Brace: Left DARCO boot for chronic wound Restrictions Weight Bearing Restrictions: No  Pain: 1st Treatment Session- L low back pain when threading pants, however subsides with rest and  repositioning.   2nd Treatment Session- No/Denies pain.    Therapy/Group: Individual Therapy  Taahir Grisby 09/22/2022, 7:51 AM

## 2022-09-22 NOTE — Progress Notes (Signed)
Occupational Therapy Session Note  Patient Details  Name: [name] MRN: [id number] Date of Birth: [birth year]  Today's Date: 09/22/2022 OT Individual Time: 1300-1355 OT Individual Time Calculation (min): 55 min    Short Term Goals: Week 1:  OT Short Term Goal 1 (Week 1): Pt will complete UB bathing with (S) OT Short Term Goal 2 (Week 1): Pt will complete LB bathing with Min A OT Short Term Goal 3 (Week 1): Pt will complete UB dressing with (S) OT Short Term Goal 4 (Week 1): Pt will complete LB dressing with Min A OT Short Term Goal 5 (Week 1): Pt will complete toileting tasks with Min A  Skilled Therapeutic Interventions/Progress Updates:    Pt resting in w/c upon arrival. OT intervention with focus on sit<>stand and standing balance to increase independence with BADLs. Pt engaged in [place] activities-visual scanning and sequencing x6 tasks. LOBx1 with min A for correcting. Pt also participated in July 4 celebration in Day Room, actively engaging with other particpants and family. Pt remarked that she enjoyed BITS activities. Pt returned to room and remained in w/c. All needs within reach and belt alarm activated.   Therapy Documentation Precautions:  Precautions Precautions: Fall Required Braces or Orthoses: Other Brace Other Brace: Left DARCO boot for chronic wound Restrictions Weight Bearing Restrictions: No Pain:  Pt denies pain this afternoon    Therapy/Group: Individual Therapy  Rich Brave 09/22/2022, 2:55 PM

## 2022-09-22 NOTE — Progress Notes (Signed)
PROGRESS NOTE   Subjective/Complaints: No events overnight.  No acute complaints, patient happy that she was able to dress herself and perform ADLs such as grooming, brushing her teeth this a.m.  Reported ongoing urinary incontinence, stress and urge, overnight. PVRs remain high-normal without straight cath.  Patient states she has noticed no improvement with Ditropan.  Agreeable to trial of Myrbetriq   ROS: Denies fevers, chills, N/V, abdominal pain, constipation, diarrhea, SOB, cough, chest pain, new weakness or paraesthesias.   + Urinary urgency/incontinence-mildly improved with Ditropan, somewhat behavioral/poor motivation + Fatigue/lethargy-improved  Objective:   No results found. No results for input(s): "WBC", "HGB", "HCT", "PLT" in the last 72 hours.  Recent Labs    09/21/22 0536  CREATININE 1.62*      Intake/Output Summary (Last 24 hours) at 09/22/2022 0855 Last data filed at 09/21/2022 1830 Rohner per 24 hour  Intake 440 ml  Output --  Net 440 ml         Physical Exam: Vital Signs Blood pressure (!) 158/57, pulse (!) 58, temperature 97.7 F (36.5 C), temperature source Oral, resp. rate 16, height 5\' 9"  (1.753 m), weight 103 kg, SpO2 98 %.     General: awake,alert.  Sitting in wheelchair at sink, brushing teeth.  HENT: conjugate gaze; oropharynx moist CV: regular rate; no JVD Pulmonary: CTA B/L; no W/R/R- good air movement GI: soft, NT, ND, (+)BS- normoactive Psychiatric: appropriate- but slightly slowed and sleepy Neurological: alert- sitting up in bed; but sleepy- stayed awake for discussion Extremities: 2+ nonpitting edema in bilateral lower extremities.  -Unchanged Psych: Pt's affect is flat, mildly depressed.  Skin: Warm and dry.   Heel wounds not examined today due to being in therapy session  + heel wounds appear to be healing compared to last admission, left heel wound remains mildly open.   A Mepilex bilaterally-appear to be healing 7-3 + Stage II to coccyx on admission            Neuro:, Alert, oriented x 3 -ongoing mild lethargy  + Occasional word finding difficulties.   -Resolved, no longer appreciable + Stocking glove neuropathy  Moving all 4 limbs antigravity and against resistance, 3+ out of 5 bilateral hip flexion, otherwise 4 out of 5 throughout.  Independent grooming, set up assist for brushing teeth.     Assessment/Plan: 1. Functional deficits which require 3+ hours per day of interdisciplinary therapy in a comprehensive inpatient rehab setting. Physiatrist is providing close team supervision and 24 hour management of active medical problems listed below. Physiatrist and rehab team continue to assess barriers to discharge/monitor patient progress toward functional and medical goals  Care Tool:  Bathing    Body parts bathed by patient: Right arm, Left arm, Chest, Abdomen, Front perineal area, Right upper leg, Left upper leg, Face   Body parts bathed by helper: Buttocks, Left lower leg, Right lower leg     Bathing assist Assist Level: Moderate Assistance - Patient 50 - 74%     Upper Body Dressing/Undressing Upper body dressing   What is the patient wearing?: Pull over shirt    Upper body assist Assist Level: Minimal Assistance - Patient > 75%  Lower Body Dressing/Undressing Lower body dressing      What is the patient wearing?: Pants, Underwear/pull up     Lower body assist Assist for lower body dressing: Maximal Assistance - Patient 25 - 49%     Toileting Toileting    Toileting assist Assist for toileting: Moderate Assistance - Patient 50 - 74%     Transfers Chair/bed transfer  Transfers assist     Chair/bed transfer assist level: Minimal Assistance - Patient > 75%     Locomotion Ambulation   Ambulation assist      Assist level: Minimal Assistance - Patient > 75% Assistive device: Walker-rolling Max distance: 60'    Walk 10 feet activity   Assist     Assist level: Minimal Assistance - Patient > 75% Assistive device: Walker-rolling   Walk 50 feet activity   Assist Walk 50 feet with 2 turns activity did not occur: Safety/medical concerns (Patient unable to ambulate >15' at this time secondary to poor endurance/activity tolerance)  Assist level: Minimal Assistance - Patient > 75% Assistive device: Walker-rolling    Walk 150 feet activity   Assist Walk 150 feet activity did not occur: Safety/medical concerns         Walk 10 feet on uneven surface  activity   Assist Walk 10 feet on uneven surfaces activity did not occur: Safety/medical concerns         Wheelchair     Assist Is the patient using a wheelchair?: Yes Type of Wheelchair: Manual    Wheelchair assist level: Dependent - Patient 0% Max wheelchair distance: Therapist pushed wheelchair due to poor endurance and shoulder pain    Wheelchair 50 feet with 2 turns activity    Assist        Assist Level: Dependent - Patient 0%   Wheelchair 150 feet activity     Assist      Assist Level: Dependent - Patient 0%   Blood pressure (!) 158/57, pulse (!) 58, temperature 97.7 F (36.5 C), temperature source Oral, resp. rate 16, height 5\' 9"  (1.753 m), weight 103 kg, SpO2 98 %.  1. Functional deficits secondary to left basal ganglia and left parietal small/punctate infarcts as well as history of right MCA infarction.  Scheduled for loop recorder prior to admission to CIR             -patient may shower             -ELOS/Goals: 7 to 10 days, supervision with PT and OT goals - 7/11 DC date             - 7/2: SPV UBS, CGA LBD and other ADLs. For PT, can be limited in bed mobility due to motivation; is walking 100 ft with significant cueing for R heel-toe pattern; goals to remain SPV for short distances/   2.  Antithrombotics: -DVT/anticoagulation:  Pharmaceutical: Lovenox             -antiplatelet therapy:  Aspirin 81 mg daily and Plavix 75 mg daily x 3 weeks then Plavix alone 3. Pain Management: Voltaren gel 4 g 4 times daily, Tylenol as needed.   - Added K-pad for right shoulder pain. 4. Mood/Behavior/Sleep: Wellbutrin 300 mg daily, Lexapro 20 mg daily, melatonin as needed             -antipsychotic agents: N/A   - 7/2: Discussed with the patient, agreeable to increase antidepressant medications.  Already on max dose Lexapro, will transition from Wellbutrin 300 mg XR  to Wellbutrin 200 mg SR twice daily.   5. Neuropsych/cognition: This patient is capable of making decisions on her own behalf. 6. Skin/Wound Care:/Bilateral diabetic foot ulcers.  Skin care as directed routine skin checks  -Mepilex to foot ulcers, Prevalon boots at nighttime  -Protein supplementation, daily Zinc 220 mg and Vitamin C 500 mg supplementation   7. Fluids/Electrolytes/Nutrition: Routine in and outs with follow-up chemistries 8.  Diabetes mellitus with peripheral neuropathy.  Latest hemoglobin A1c 7.0.  NovoLog Mix 70/30 20 units twice daily             -Prior use of metformin, to restart if needed  -Monitor today, blood glucose mildly elevated overnight.  6/29- will increase 70/30 to 22 units BID since running 140s to 212- since Cr elevated, don't feel comfortable starting Metformin.   6/30- much better since increased 70/30- down to 110s-120s from 200's; STABLE 7/1-4 Recent Labs    09/21/22 1702 09/21/22 2225 09/22/22 0625  GLUCAP 175* 177* 133*      9.  Hypertension.  Coreg 3.125 mg twice daily, Norvasc 5 mg daily (half home dose).  Monitor with increased mobility.  Long-term BP goal normotensive.  Avoid hypotension/dehydration.  - 6/27: Hypertensive 170s overnight, neurology recommending higher BP goals due to orthostasis.  Will order as needed hydralazine 10 mg every 8 hours for SBP greater than 180, DBP greater than 110.  Teds and abdominal binder ordered.   - 6/28: 150s to 170s today, BS adequate, increase  Norvasc to 10 mg   - 7/2: Resume Losartan at 25 mg daily -will wait 1 to 2 days to see effect; had creatinine this a.m. 1.6 mildly elevated from last take but within baseline as below  - 7/4: repeat BMP in AM; improving, may increase losartan tomorrow AM    09/22/2022    4:41 AM 09/21/2022    7:51 PM 09/21/2022    2:22 PM  Vitals with BMI  Systolic 158 159 696  Diastolic 57 56 59  Pulse 58 66 66    10.  Obesity.  BMI 33.52.  Dietary follow-up 11.  CKD IIIa, creatinine baseline 1.57-1.84.  Follow-up chemistries  -Creatinine 1.5 this a.m., at baseline, monitor   12.  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  Follow-up outpatient Dr Pamelia Hoit.  Continue Nolvadex. 13.  Hyperlipidemia.  Lipitor 80 mg daily 14.  Right shoulder pain             -Improved, continue Voltaren gel    15. Constipation.  No recorded bowel movement since admission.  Ordered senna 1 tab nightly, MiraLAX daily  - Small BM 6/28; monitor  First decent BM 6/28 per pt.   - LBM 6/30, medium  16.  Urinary urgency.   Start Ditropan 2.5 mg twice daily.   6/29- Pt's PVRs up to 200s- 369 and another value of 325cc- Has not been cathed so far- has been able to void.   6/30- will check U/A and Cx- since malodorous and still retaining- last checked 6/22 and had (+) Nitrites and small leuks- likely has become UTI? Pending at this time.   7/1 - UA appears UTI, Cx pending, culture from 6-22 showing pansensitive E. Coli; initially determine asymptomatic bacteria, however patient was slowed cognition, increased confusion, and increased urinary urgency from last hospitalization.  Will start Keflex 500 mg BID for 7 days; allergic to Bactrim and Cipro. Still 200-300 on bladder scans.   7-3:  Bladder scans lower overnight, but a few incontinent episodes.  Discussed with patient this a.m.  Urinary cultures Keflex sensitive.   - 7/4: Ongoing urge and stress incontinence, with  borderline retention. Concerned for cholinergic s/e with ongoing confusion, lethargy. DC ditropan, switch to myrbetriq in AM.   17. OSA. Per patient recently sold her CPAP machine.  Discussed RT consult for inpatient use, patient refuses, states she was unable to tolerate it and is not willing to try again.  Will follow-up.  -7-3: Still refusing CPAP, despite significant daytime lethargy.   LOS: 8 days A FACE TO FACE EVALUATION WAS PERFORMED  [name] 09/22/2022, 8:55 AM

## 2022-09-22 NOTE — Group Note (Signed)
Patient Details Name: [name] MRN: [id number] DOB: [birth year] Today's Date: 09/22/2022  Time Calculation: OT Group Time Calculation OT Group Start Time: 1100 OT Group Stop Time: 1200 OT Group Time Calculation (min): 60 min      Group Description: Dance Group: Pt participated in dance group with an emphasis on social interaction, motor planning, increasing overall activity tolerance and bimanual tasks. All songs were selected by group members. Dance moves included AROM of BUE/BLE Bubeck motor movements with an emphasis on building functional endurance.    Individual level documentation: Patient completed group from sitting level. Patientt needed supervision to complete various dance moves with cues for motivation and PLB'ing during rest breaks.  Patient able to complete dance movements without modifications during group.  Pain:  0/10  Precautions:  Azzie Almas 09/22/2022, 4:22 PM

## 2022-09-23 LAB — GLUCOSE, CAPILLARY
Glucose-Capillary: 124 mg/dL — ABNORMAL HIGH (ref 70–99)
Glucose-Capillary: 173 mg/dL — ABNORMAL HIGH (ref 70–99)
Glucose-Capillary: 172 mg/dL — ABNORMAL HIGH (ref 70–99)
Glucose-Capillary: 164 mg/dL — ABNORMAL HIGH (ref 70–99)

## 2022-09-23 NOTE — Progress Notes (Signed)
PROGRESS NOTE   Subjective/Complaints: No events overnight.  No acute complaints. ISC x1 overnight for retention 400-500 ccs. has not recurred, patient has voided since. Patient continues with extreme daytime fatigue, is agreeable to trying hospital CPAP at night. Endorses dressings on feet were changed today.  ROS: Denies fevers, chills, N/V, abdominal pain, constipation, diarrhea, SOB, cough, chest pain, new weakness or paraesthesias.   + Urinary urgency/incontinence-mildly improved with Ditropan, somewhat behavioral/poor motivation + Fatigue/lethargy-improved  Objective:   No results found. No results for input(s): "WBC", "HGB", "HCT", "PLT" in the last 72 hours.  Recent Labs    09/21/22 0536  CREATININE 1.62*      Intake/Output Summary (Last 24 hours) at 09/23/2022 0908 Last data filed at 09/23/2022 0805 Guderian per 24 hour  Intake 1248 ml  Output 525 ml  Net 723 ml         Physical Exam: Vital Signs Blood pressure (!) 153/53, pulse 63, temperature 97.7 F (36.5 C), temperature source Oral, resp. rate 16, height 5\' 9"  (1.753 m), weight 103 kg, SpO2 99 %.     General: awake,alert.  Sitting on the bedside table, sleeping in wheelchair.   HENT: conjugate gaze; oropharynx moist CV: regular rate; no JVD Pulmonary: CTA B/L; no W/R/R- good air movement GI: soft, NT, ND, (+)BS- normoactive Psychiatric: appropriate- but slightly slowed and sleepy Neurological: alert- sitting up in bed; but sleepy- stayed awake for discussion Extremities: 2+ nonpitting edema in bilateral lower extremities.  -Unchanged Psych: Pt's affect is flat, mildly depressed.  Lethargic. Skin:  + heel wounds appear to be healing compared to last admission, left heel wound remains mildly open.  A Mepilex bilaterally-appear to be healing 7-3 + Stage II to coccyx on admission            Neuro:, Alert, oriented x 3 -ongoing  lethargy  +  Occasional word finding difficulties.   -Resolved, no longer appreciable + Stocking glove neuropathy  Moving all 4 limbs antigravity and against resistance, 3+ out of 5 bilateral hip flexion, otherwise 4 out of 5 throughout.       Assessment/Plan: 1. Functional deficits which require 3+ hours per day of interdisciplinary therapy in a comprehensive inpatient rehab setting. Physiatrist is providing close team supervision and 24 hour management of active medical problems listed below. Physiatrist and rehab team continue to assess barriers to discharge/monitor patient progress toward functional and medical goals  Care Tool:  Bathing    Body parts bathed by patient: Right arm, Left arm, Chest, Abdomen, Front perineal area, Right upper leg, Left upper leg, Face   Body parts bathed by helper: Buttocks, Left lower leg, Right lower leg     Bathing assist Assist Level: Moderate Assistance - Patient 50 - 74%     Upper Body Dressing/Undressing Upper body dressing   What is the patient wearing?: Pull over shirt    Upper body assist Assist Level: Minimal Assistance - Patient > 75%    Lower Body Dressing/Undressing Lower body dressing      What is the patient wearing?: Pants, Underwear/pull up     Lower body assist Assist for lower body dressing: Maximal Assistance - Patient 25 -  49%     Toileting Toileting    Toileting assist Assist for toileting: Moderate Assistance - Patient 50 - 74%     Transfers Chair/bed transfer  Transfers assist     Chair/bed transfer assist level: Minimal Assistance - Patient > 75%     Locomotion Ambulation   Ambulation assist      Assist level: Minimal Assistance - Patient > 75% Assistive device: Walker-rolling Max distance: 60'   Walk 10 feet activity   Assist     Assist level: Minimal Assistance - Patient > 75% Assistive device: Walker-rolling   Walk 50 feet activity   Assist Walk 50 feet with 2 turns activity did not  occur: Safety/medical concerns (Patient unable to ambulate >15' at this time secondary to poor endurance/activity tolerance)  Assist level: Minimal Assistance - Patient > 75% Assistive device: Walker-rolling    Walk 150 feet activity   Assist Walk 150 feet activity did not occur: Safety/medical concerns         Walk 10 feet on uneven surface  activity   Assist Walk 10 feet on uneven surfaces activity did not occur: Safety/medical concerns         Wheelchair     Assist Is the patient using a wheelchair?: Yes Type of Wheelchair: Manual    Wheelchair assist level: Dependent - Patient 0% Max wheelchair distance: Therapist pushed wheelchair due to poor endurance and shoulder pain    Wheelchair 50 feet with 2 turns activity    Assist        Assist Level: Dependent - Patient 0%   Wheelchair 150 feet activity     Assist      Assist Level: Dependent - Patient 0%   Blood pressure (!) 153/53, pulse 63, temperature 97.7 F (36.5 C), temperature source Oral, resp. rate 16, height 5\' 9"  (1.753 m), weight 103 kg, SpO2 99 %.  1. Functional deficits secondary to left basal ganglia and left parietal small/punctate infarcts as well as history of right MCA infarction.  Scheduled for loop recorder prior to admission to CIR             -patient may shower             -ELOS/Goals: 7 to 10 days, supervision with PT and OT goals - 7/11 DC date             - 7/2: SPV UBS, CGA LBD and other ADLs. For PT, can be limited in bed mobility due to motivation; is walking 100 ft with significant cueing for R heel-toe pattern; goals to remain SPV for short distances/   2.  Antithrombotics: -DVT/anticoagulation:  Pharmaceutical: Lovenox             -antiplatelet therapy: Aspirin 81 mg daily and Plavix 75 mg daily x 3 weeks then Plavix alone 3. Pain Management: Voltaren gel 4 g 4 times daily, Tylenol as needed.   - Added K-pad for right shoulder pain. 4. Mood/Behavior/Sleep:  Wellbutrin 300 mg daily, Lexapro 20 mg daily, melatonin as needed             -antipsychotic agents: N/A   - 7/2: Discussed with the patient, agreeable to increase antidepressant medications.  Already on max dose Lexapro, will transition from Wellbutrin 300 mg XR to Wellbutrin 200 mg SR twice daily.  7-5: Patient agreeable to trial of CPAP, RT consulted  5. Neuropsych/cognition: This patient is capable of making decisions on her own behalf. 6. Skin/Wound Care:/Bilateral diabetic foot ulcers.  Skin care as directed routine skin checks  -Mepilex to foot ulcers, Prevalon boots at nighttime  -Protein supplementation, daily Zinc 220 mg and Vitamin C 500 mg supplementation   7. Fluids/Electrolytes/Nutrition: Routine in and outs with follow-up chemistries  Filed Weights   09/14/22 1814 09/21/22 1049  Weight: 100 kg 103 kg    8.  Diabetes mellitus with peripheral neuropathy.  Latest hemoglobin A1c 7.0.  NovoLog Mix 70/30 20 units twice daily             -Prior use of metformin, to restart if needed  -Monitor today, blood glucose mildly elevated overnight.  6/29- will increase 70/30 to 22 units BID since running 140s to 212- since Cr elevated, don't feel comfortable starting Metformin.   6/30- much better since increased 70/30- down to 110s-120s from 200's; STABLE 7/1-5 Recent Labs    09/22/22 1647 09/23/22 0701 09/23/22 1212  GLUCAP 175* 124* 173*     9.  Hypertension.  Coreg 3.125 mg twice daily, Norvasc 5 mg daily (half home dose).  Monitor with increased mobility.  Long-term BP goal normotensive.  Avoid hypotension/dehydration.  - 6/27: Hypertensive 170s overnight, neurology recommending higher BP goals due to orthostasis.  Will order as needed hydralazine 10 mg every 8 hours for SBP greater than 180, DBP greater than 110.  Teds and abdominal binder ordered.   - 6/28: 150s to 170s today, BS adequate, increase Norvasc to 10 mg   - 7/2: Resume Losartan at 25 mg daily -will wait 1 to 2 days  to see effect; had creatinine this a.m. 1.6 mildly elevated from last take but within baseline as below  - 7/5: BMP not obtained this a.m., will hold off on losartan increase due to normotension and check on Monday    09/23/2022    4:04 AM 09/22/2022    8:08 PM 09/22/2022    1:13 PM  Vitals with BMI  Systolic 153 144 102  Diastolic 53 49 80  Pulse 63 65 66    10.  Obesity.  BMI 33.52.  Dietary follow-up 11.  CKD IIIa, creatinine baseline 1.57-1.84.  Follow-up chemistries  -Creatinine 1.5 this a.m., at baseline, monitor -repeat Monday   12.  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  Follow-up outpatient Dr Pamelia Hoit.  Continue Nolvadex. 13.  Hyperlipidemia.  Lipitor 80 mg daily 14.  Right shoulder pain             -Improved, continue Voltaren gel    15. Constipation.  No recorded bowel movement since admission.  Ordered senna 1 tab nightly, MiraLAX daily  - Small BM 6/28; monitor  First decent BM 6/28 per pt.   - LBM 6/30, medium  16.  Urinary urgency/retention/UTI Start Ditropan 2.5 mg twice daily.   6/29- Pt's PVRs up to 200s- 369 and another value of 325cc- Has not been cathed so far- has been able to void.   6/30- will check U/A and Cx- since malodorous and still retaining- last checked 6/22 and had (+) Nitrites and small leuks- likely has become UTI? Pending at this time.   7/1 - UA appears UTI, Cx pending, culture from 6-22 showing pansensitive E. Coli; initially determine asymptomatic bacteria, however patient was slowed cognition, increased confusion, and increased urinary urgency from last hospitalization.  Will start Keflex 500 mg BID for 7 days; allergic to Bactrim and Cipro. Still 200-300 on bladder scans.   7-3: Bladder scans lower overnight,  but a few incontinent episodes.  Discussed with patient this a.m.  Urinary cultures Keflex sensitive.   - 7/4: Ongoing urge and stress incontinence, with borderline  retention. Concerned for cholinergic s/e with ongoing confusion, lethargy. DC ditropan, switch to myrbetriq in AM.   7-5: Required ISC 1 time overnight.  Switching from Ditropan to Myrbetriq today; so far PVRs remain low.  If further ISC use this weekend, would discontinue Myrbetriq  17. OSA. Per patient recently sold her CPAP machine.  Discussed RT consult for inpatient use, patient refuses, states she was unable to tolerate it and is not willing to try again.  Will follow-up.  -7-3: Still refusing CPAP, despite significant daytime lethargy.  7-5: Patient agreeable to trial of CPAP; RT consulted   LOS: 9 days A FACE TO FACE EVALUATION WAS PERFORMED  [name] 09/23/2022, 9:08 AM

## 2022-09-23 NOTE — Progress Notes (Signed)
Occupational Therapy Session Note  Patient Details  Name: [name] MRN: [id number] Date of Birth: [birth year]  Today's Date: 09/23/2022 OT Individual Time: 4098-1191 OT Individual Time Calculation (min): 75 min    Short Term Goals: Week 1:  OT Short Term Goal 1 (Week 1): Pt will complete UB bathing with (S) OT Short Term Goal 2 (Week 1): Pt will complete LB bathing with Min A OT Short Term Goal 3 (Week 1): Pt will complete UB dressing with (S) OT Short Term Goal 4 (Week 1): Pt will complete LB dressing with Min A OT Short Term Goal 5 (Week 1): Pt will complete toileting tasks with Min A    Skilled Therapeutic Interventions/Progress Updates:    Pt receved lying supine in bed finishing up breakfast.  No c/o pain this am.  Pt agreeable to take a shower.  Supine<>EOB with (S).  Sit to stand with Min A for initiation and RW.  Functional mobility to bathroom ~25 ft with RW and CGA.  Stand<>pivot transfer with RW to toilet with CGA. Pt needed to void and had BM.  Documented within flow sheet.  Pt able to complete toileting tasks with Min A for posterior peri-care.   Stand<>Pivot into shower with CGA and use of grab bars for side stepping to shower bench.  UB bathing completed with Min A specifically for LE, back and buttocks. Functional mobilty to wheelchair ~19ft with RW.  Sit<>stand with Min A for pulling up brief and pt had loss of balance and sat down in chair.  LB dressing completed in seated position in wheelchair with Min A for donning socks, darco, and shoe.  Self-care completed at sink level sitting in wheelchair with (S).  Wound care completed on medial side of L foot and bandages replaced on heels of feet.  Pt left sitting in wheelchair.  Chair alarm set, call light within reach and all needs meet.   Therapy Documentation Precautions:  Precautions Precautions: Fall Required Braces or Orthoses: Other Brace Other Brace: Left DARCO boot for chronic wound Restrictions Weight Bearing  Restrictions: No      Therapy/Group: Individual Therapy  Liam Graham 09/23/2022, 7:22 AM

## 2022-09-23 NOTE — Progress Notes (Signed)
Physical Therapy Weekly Progress Note  Patient Details  Name: [name] MRN: [id number] Date of Birth: Nov 10, 1950  Beginning of progress report period: September 15, 2022 End of progress report period: September 23, 2022  Today's Date: 09/23/2022 PT Individual Time: 1000-1045 PT Individual Time Calculation (min): 45 min   Patient has met 3 of 3 short term goals. Patient is progressing toward her LTGs since initial evaluation, however continues to be limited functionally secondary to impaired endurance/activity tolerance and global weakness. Patient currently requires Supv/MinA for bed mobility, CGA/SBA for sit/stands, transfers and gait up to 140' with the use of a RW, CGA/MinA for stair mobility with the use of BHR. Patient's sister is scheduled to come in for hands-on family training next Monday in order to ensure a safe discharge home.   Patient continues to demonstrate the following deficits muscle weakness, decreased cardiorespiratoy endurance, decreased memory and delayed processing, and decreased standing balance and decreased balance strategies and therefore will continue to benefit from skilled PT intervention to increase functional independence with mobility.  Patient progressing toward long term goals..  Continue plan of care.  PT Short Term Goals Week 1:  PT Short Term Goal 1 (Week 1): Patient will complete bed mobility with MinA PT Short Term Goal 1 - Progress (Week 1): Met PT Short Term Goal 2 (Week 1): Patient will complete bed/chair transfer with LRAD and CGA PT Short Term Goal 2 - Progress (Week 1): Met PT Short Term Goal 3 (Week 1): Patient will ambulate >50' with LRAD and CGA PT Short Term Goal 3 - Progress (Week 1): Met Week 2:  PT Short Term Goal 1 (Week 2): STGs=LTGs secondary to ELOS  Skilled Therapeutic Interventions/Progress Updates:  Patient greeted sitting upright in wheelchair in her room and agreeable to PT treatment session. Patient wheeled to/from her room and main  rehab gym for time management and energy conservation.   Patient gait trained x95' with RW and CGA/SBA- VC throughout for improved postural extension/forward gaze, stepping within the frame of the walker and increased RLE step length/foot clearance with good improvements noted however unable to sustain as she fatigued.   Patient tasked with ambulating forward and backward ~10' with RW and CGA for safety- VC for increased step length, especially with retro gait and for ensuring the walker is close to her body throughout activity. Patient performed 2 x 10' in each direction.    Patient stood with RW while stepping over a 6" hurdle- Patient performed x5 on each LE with significant increase in difficulty with LLE, especially when stepping backward over the hurdle. With increased repetition, patient demonstrated decreased L foot clearance and frequently was unable to clear the hurdle.   Patient returned to her room and left sitting upright in wheelchair with posey belt on, call bell within reach and all needs met. Patient required increased time to complete all functional mobility tasks secondary to impaired endurance/activity tolerance, decreased strength and decreased desire to perform activities.    Therapy Documentation Precautions:  Precautions Precautions: Fall Required Braces or Orthoses: Other Brace Other Brace: Left DARCO boot for chronic wound Restrictions Weight Bearing Restrictions: No  Pain: No/Denies pain.    Therapy/Group: Individual Therapy  [name] 09/23/2022, 7:45 AM

## 2022-09-23 NOTE — Progress Notes (Signed)
Occupational Therapy Session Note  Patient Details  Name: [name] MRN: [id number] Date of Birth: Aug 11, 1950  Today's Date: 09/23/2022 OT Individual Time: 1103-1230 OT Individual Time Calculation (min): 87 min    Skilled Therapeutic Interventions/Progress Updates: Patient received sitting EOB. Agreeable to OT treatment. Patient did express fatigue, reporting that she was up a good bit the night before due to continued difficulty voiding. Nursing requesting therapist transfer patient to the toilet to attempt to void at the end of treatment session. Patient able to transfer to from EOB to w/c with CGA using RW for balance. Patient propelled to therapy gym for work on FM coordination and strengthening of B hands to improve independence with functional tasks such as opening food containers. Patient given yellow thera-putty for pinch and Mcpeters grasp strengthening exercises. Patient with good return demo for FM tasks. Followed with FM manipulatives working on in hand storage and index finger over thumb/thumb over index finger object manipulation. Patient with prior deficits and reports that her sister had taken over managing and giving her medicine and also writing checks that she would sign for paying bills. Continued treatment with FM writing task working on tracing and following lines. Patient able to hold pen without built up grip, but patient clearly has been frustrated with the progressive decline of her good penmanship. Patient assisted back to her room for functional toilet transfer using RW to ambulate into the bathroom. Patient was unable to void and nursing notified of the attempt. Ambulated to sink for hand hygiene before sitting EOB for lunch. Patient reports having difficulty cutting food at times, though today at lunch she had a sandwich. Patient shown examples of rocker knives that may benefit her. Min assist with LLE lifting it into bed to lie down. Cues provided for bridging to get better  aligned in the bed. Good participation. Continue with skilled OT POC to achieve LTG's.     Therapy Documentation Precautions:  Precautions Precautions: Fall Required Braces or Orthoses: Other Brace Other Brace: Left DARCO boot for chronic wound Restrictions Weight Bearing Restrictions: No    ADL: ADL Eating: Modified independent Where Assessed-Eating: Wheelchair Grooming: Independent Where Assessed-Grooming: Sitting at sink, Wheelchair Upper Body Bathing: Minimal assistance Where Assessed-Upper Body Bathing: Shower Lower Body Bathing: Moderate assistance Where Assessed-Lower Body Bathing: Shower Upper Body Dressing: Minimal assistance Where Assessed-Upper Body Dressing: Wheelchair Lower Body Dressing: Maximal assistance Where Assessed-Lower Body Dressing: Wheelchair Toileting: Moderate assistance, Minimal assistance Where Assessed-Toileting: Teacher, adult education: Curator Method: Proofreader: Raised toilet seat Tub/Shower Transfer: Moderate assistance Tub/Shower Transfer Method: Ambulating Tub/Shower Equipment: Information systems manager with back Film/video editor: Moderate assistance Film/video editor Method: Manufacturing systems engineer with back ADL Comments: Pt performs tasks very slowly    Therapy/Group: Individual Therapy  Warnell Forester 09/23/2022, 12:57 PM

## 2022-09-23 NOTE — Progress Notes (Signed)
Occupational Therapy Weekly Progress Note  Patient Details  Name: [name] MRN: [id number] Date of Birth: October 20, 1950  Beginning of progress report period: September 15, 2022 End of progress report period: September 23, 2022  Patient has met 5 of 5 short term goals.  Pt is currently able to complete UB dressing with (S), LB dressing with Min A, UB bathing with (S), LB bathing with Min A, Toileting tasks with CGA and functional mobility with use of RW with CGA.  Family education or will be scheduled closer to d/c.  Patient continues to demonstrate the following deficits: muscle weakness, decreased coordination, decreased awareness, decreased problem solving, and decreased safety awareness, and decreased balance strategies and therefore will continue to benefit from skilled OT intervention to enhance overall performance with BADL.  Patient progressing toward long term goals..  Continue plan of care.  OT Short Term Goals Week 1:  OT Short Term Goal 1 (Week 1): (P) Pt will complete UB bathing with (S) OT Short Term Goal 1 - Progress (Week 1): Met OT Short Term Goal 2 (Week 1): Pt will complete LB bathing with Min A OT Short Term Goal 2 - Progress (Week 1): Met OT Short Term Goal 3 (Week 1): Pt will complete UB dressing with (S) OT Short Term Goal 3 - Progress (Week 1): Met OT Short Term Goal 4 (Week 1): Pt will complete LB dressing with Min A OT Short Term Goal 4 - Progress (Week 1): Met OT Short Term Goal 5 (Week 1): Pt will complete toileting tasks with Min A OT Short Term Goal 5 - Progress (Week 1): Met Week 2:  OT Short Term Goal 1 (Week 2): LTG=STG 2/2 ELOS      Therapy/Group: Individual Therapy  Liam Graham 09/23/2022, 12:47 PM

## 2022-09-24 LAB — GLUCOSE, CAPILLARY
Glucose-Capillary: 129 mg/dL — ABNORMAL HIGH (ref 70–99)
Glucose-Capillary: 130 mg/dL — ABNORMAL HIGH (ref 70–99)
Glucose-Capillary: 145 mg/dL — ABNORMAL HIGH (ref 70–99)
Glucose-Capillary: 155 mg/dL — ABNORMAL HIGH (ref 70–99)

## 2022-09-24 NOTE — Progress Notes (Signed)
Physical Therapy Session Note  Patient Details  Name: [name] MRN: [id number] Date of Birth: 1950-12-17  Today's Date: 09/24/2022 PT Missed Time: 45 Minutes Missed Time Reason: Patient fatigue  Short Term Goals: Week 1:  PT Short Term Goal 1 (Week 1): Patient will complete bed mobility with MinA PT Short Term Goal 1 - Progress (Week 1): Met PT Short Term Goal 2 (Week 1): Patient will complete bed/chair transfer with LRAD and CGA PT Short Term Goal 2 - Progress (Week 1): Met PT Short Term Goal 3 (Week 1): Patient will ambulate >50' with LRAD and CGA PT Short Term Goal 3 - Progress (Week 1): Met Week 2:  PT Short Term Goal 1 (Week 2): STGs=LTGs secondary to ELOS  Skilled Therapeutic Interventions/Progress Updates:  Patient supine in bed with eyes closed on entrance to room. Patient drowsy and relates fitful evening with 5 instances of UI and then finally requiring in/out cath.  Pt is lethargic and relates desire to remain in bed to sleep.   Patient with no pain complaint on entrance to room.   Pt missed 45 min of skilled therapy due to fatigue from lack of sleep and bladder/ UI issues throughout previous night. Will re-attempt as schedule and pt availability permits.  Patient remains supine at end of time in room with brakes locked, bed alarm set, and all needs within reach.   Therapy Documentation Precautions:  Precautions Precautions: Fall Required Braces or Orthoses: Other Brace Other Brace: Left DARCO boot for chronic wound Restrictions Weight Bearing Restrictions: No General: PT Amount of Missed Time (min): 45 Minutes PT Missed Treatment Reason: Patient fatigue Vital Signs:   Pain:  No indication of pain from patient.   Therapy/Group: Individual Therapy  Loel Dubonnet PT, DPT, CSRS 09/24/2022, 11:21 AM

## 2022-09-24 NOTE — Progress Notes (Signed)
Occupational Therapy Session Note  Patient Details  Name: [name] MRN: [id number] Date of Birth: 1951/01/12  Today's Date: 09/24/2022 OT Individual Time: 1345-1415 OT Individual Time Calculation (min): 30 min  and Today's Date: 09/24/2022 OT Missed Time: 45 Minutes Missed Time Reason: Patient fatigue;Patient unwilling/refused to participate without medical reason   Short Term Goals: Week 2:  OT Short Term Goal 1 (Week 2): LTG=STG 2/2 ELOS  Skilled Therapeutic Interventions/Progress Updates:    Pt received supine with no c/o pain, reporting frustration with mix up on lunch order and not having eaten full meal yet, awaiting meal tray. She was agreeable to participate at bed level only and until meal arrives. Pt completed BUE therex with a 3lb dowel. Completed to challenge BUE strength and endurance required for maximal independence with ADLs and ADL transfers. Pt completed overhead shoulder press, forward chest press, B rows, and bicep curls. Cueing required to ensure proper form and technique for proper muscle activation. 3x10 repetitions. Her meal was now present and she declined any further participation. She was left with meal set up, 45 min missed.    Therapy Documentation Precautions:  Precautions Precautions: Fall Required Braces or Orthoses: Other Brace Other Brace: Left DARCO boot for chronic wound Restrictions Weight Bearing Restrictions: No  Therapy/Group: Individual Therapy  Crissie Reese 09/24/2022, 6:54 AM

## 2022-09-24 NOTE — Progress Notes (Signed)
PROGRESS NOTE   Subjective/Complaints:  Sleeping but awakens easily  States she has neuropathy up to the knees and is starting in the hands   ROS: Denies fevers, chills, N/V, abdominal pain, constipation, diarrhea, SOB, cough, chest pain, new weakness or paraesthesias.   + Urinary urgency/incontinence-mildly improved with Ditropan, somewhat behavioral/poor motivation + Fatigue/lethargy-improved  Objective:   No results found. No results for input(s): "WBC", "HGB", "HCT", "PLT" in the last 72 hours.  No results for input(s): "NA", "K", "CL", "CO2", "GLUCOSE", "BUN", "CREATININE", "CALCIUM" in the last 72 hours.    Intake/Output Summary (Last 24 hours) at 09/24/2022 0837 Last data filed at 09/24/2022 0144 Linan per 24 hour  Intake 600 ml  Output 625 ml  Net -25 ml         Physical Exam: Vital Signs Blood pressure 130/68, pulse 72, temperature 97.6 F (36.4 C), temperature source Oral, resp. rate 18, height 5\' 9"  (1.753 m), weight 103 kg, SpO2 98 %.   General: No acute distress Mood and affect are appropriate Heart: Regular rate and rhythm no rubs murmurs or extra sounds Lungs: Clear to auscultation, breathing unlabored, no rales or wheezes Abdomen: Positive bowel sounds, soft nontender to palpation, nondistended Extremities: No clubbing, cyanosis, or edema Skin: No evidence of breakdown, no evidence of rash Neurologic: Cranial nerves II through XII intact, motor strength is 4/5 in bilateral deltoid, bicep, tricep, grip, 4- RIght and 3- left  hip flexor, knee extensors, ankle dorsiflexor and plantar flexor Sensory exam absent LT ensation below knees bilaterally   Musculoskeletal: Full range of motion in all 4 extremities. No joint swelling  Extremities: 2+ nonpitting edema in bilateral lower extremities.  -Unchanged Psych: Pt's affect is flat, mildly depressed.  Lethargic. Skin:  + heel wounds appear to be healing  compared to last admission, left heel wound remains mildly open.  A Mepilex bilaterally-appear to be healing 7-3 + Stage II to coccyx on admission                  Assessment/Plan: 1. Functional deficits which require 3+ hours per day of interdisciplinary therapy in a comprehensive inpatient rehab setting. Physiatrist is providing close team supervision and 24 hour management of active medical problems listed below. Physiatrist and rehab team continue to assess barriers to discharge/monitor patient progress toward functional and medical goals  Care Tool:  Bathing    Body parts bathed by patient: Right arm, Left arm, Chest, Abdomen, Front perineal area, Right upper leg, Left upper leg, Face   Body parts bathed by helper: Buttocks, Left lower leg, Right lower leg     Bathing assist Assist Level: Moderate Assistance - Patient 50 - 74%     Upper Body Dressing/Undressing Upper body dressing   What is the patient wearing?: Pull over shirt    Upper body assist Assist Level: Minimal Assistance - Patient > 75%    Lower Body Dressing/Undressing Lower body dressing      What is the patient wearing?: Pants, Underwear/pull up     Lower body assist Assist for lower body dressing: Maximal Assistance - Patient 25 - 49%     Toileting Toileting    Toileting assist  Assist for toileting: Moderate Assistance - Patient 50 - 74%     Transfers Chair/bed transfer  Transfers assist     Chair/bed transfer assist level: Minimal Assistance - Patient > 75%     Locomotion Ambulation   Ambulation assist      Assist level: Minimal Assistance - Patient > 75% Assistive device: Walker-rolling Max distance: 62'   Walk 10 feet activity   Assist     Assist level: Minimal Assistance - Patient > 75% Assistive device: Walker-rolling   Walk 50 feet activity   Assist Walk 50 feet with 2 turns activity did not occur: Safety/medical concerns (Patient unable to ambulate  >15' at this time secondary to poor endurance/activity tolerance)  Assist level: Minimal Assistance - Patient > 75% Assistive device: Walker-rolling    Walk 150 feet activity   Assist Walk 150 feet activity did not occur: Safety/medical concerns         Walk 10 feet on uneven surface  activity   Assist Walk 10 feet on uneven surfaces activity did not occur: Safety/medical concerns         Wheelchair     Assist Is the patient using a wheelchair?: Yes Type of Wheelchair: Manual    Wheelchair assist level: Dependent - Patient 0% Max wheelchair distance: Therapist pushed wheelchair due to poor endurance and shoulder pain    Wheelchair 50 feet with 2 turns activity    Assist        Assist Level: Dependent - Patient 0%   Wheelchair 150 feet activity     Assist      Assist Level: Dependent - Patient 0%   Blood pressure 130/68, pulse 72, temperature 97.6 F (36.4 C), temperature source Oral, resp. rate 18, height 5\' 9"  (1.753 m), weight 103 kg, SpO2 98 %.  1. Functional deficits secondary to left basal ganglia and left parietal small/punctate infarcts as well as history of right MCA infarction.             -patient may shower             -ELOS/Goals: 7 to 10 days, supervision with PT and OT goals - 7/11 DC date   2.  Antithrombotics: -DVT/anticoagulation:  Pharmaceutical: Lovenox             -antiplatelet therapy: Aspirin 81 mg daily and Plavix 75 mg daily x 3 weeks then Plavix alone 3. Pain Management: Voltaren gel 4 g 4 times daily, Tylenol as needed.   - Added K-pad for right shoulder pain. 4. Mood/Behavior/Sleep: Wellbutrin 300 mg daily, Lexapro 20 mg daily, melatonin as needed             -antipsychotic agents: N/A   - 7/2: Discussed with the patient, agreeable to increase antidepressant medications.  Already on max dose Lexapro, will transition from Wellbutrin 300 mg XR to Wellbutrin 200 mg SR twice daily.  7-5: Patient agreeable to trial of  CPAP, RT consulted  5. Neuropsych/cognition: This patient is capable of making decisions on her own behalf. 6. Skin/Wound Care:/Bilateral diabetic foot ulcers.  Skin care as directed routine skin checks  -Mepilex to foot ulcers, Prevalon boots at nighttime  -Protein supplementation, daily Zinc 220 mg and Vitamin C 500 mg supplementation   7. Fluids/Electrolytes/Nutrition: Routine in and outs with follow-up chemistries  Filed Weights   09/14/22 1814 09/21/22 1049  Weight: 100 kg 103 kg    8.  Diabetes mellitus with peripheral neuropathy.  Latest hemoglobin A1c 7.0.  NovoLog Mix  70/30 20 units twice daily             -Prior use of metformin, to restart if needed  -Monitor today, blood glucose mildly elevated overnight.  6/29- will increase 70/30 to 22 units BID since running 140s to 212- since Cr elevated, don't feel comfortable starting Metformin.   6/30- much better since increased 70/30- down to 110s-120s from 200's; STABLE 7/1-5 Recent Labs    09/23/22 1634 09/23/22 2106 09/24/22 0609  GLUCAP 172* 164* 129*      9.  Hypertension.  Coreg 3.125 mg twice daily, Norvasc 5 mg daily (half home dose).  Monitor with increased mobility.  Long-term BP goal normotensive.  Avoid hypotension/dehydration.  - 6/27: Hypertensive 170s overnight, neurology recommending higher BP goals due to orthostasis.  Will order as needed hydralazine 10 mg every 8 hours for SBP greater than 180, DBP greater than 110.  Teds and abdominal binder ordered.   - 6/28: 150s to 170s today, BS adequate, increase Norvasc to 10 mg   - 7/2: Resume Losartan at 25 mg daily -will wait 1 to 2 days to see effect; had creatinine this a.m. 1.6 mildly elevated from last take but within baseline as below  - 7/5: BMP not obtained this a.m., will hold off on losartan increase due to normotension and check on Monday    09/24/2022    4:07 AM 09/23/2022    7:38 PM 09/23/2022    1:34 PM  Vitals with BMI  Systolic 130 136 782  Diastolic  68 45 46  Pulse 72 59 67    10.  Obesity.  BMI 33.52.  Dietary follow-up 11.  CKD IIIa, creatinine baseline 1.57-1.84.  Follow-up chemistries  -Creatinine 1.5 this a.m., at baseline, monitor -repeat Monday   12.  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  Follow-up outpatient Dr Pamelia Hoit.  Continue Nolvadex. 13.  Hyperlipidemia.  Lipitor 80 mg daily 14.  Right shoulder pain             -Improved, continue Voltaren gel    15. Constipation.  No recorded bowel movement since admission.  Ordered senna 1 tab nightly, MiraLAX daily  - Small BM 6/28; monitor  First decent BM 6/28 per pt.   - LBM 6/30, medium  16.  Urinary urgency/retention/UTI Start Ditropan 2.5 mg twice daily.   6/29- Pt's PVRs up to 200s- 369 and another value of 325cc- Has not been cathed so far- has been able to void.   6/30- will check U/A and Cx- since malodorous and still retaining- last checked 6/22 and had (+) Nitrites and small leuks- likely has become UTI? Pending at this time.   7/1 - UA appears UTI, Cx pending, culture from 6-22 showing pansensitive E. Coli; initially determine asymptomatic bacteria, however patient was slowed cognition, increased confusion, and increased urinary urgency from last hospitalization.  Will start Keflex 500 mg BID for 7 days; allergic to Bactrim and Cipro. Still 200-300 on bladder scans.   7-3: Bladder scans lower overnight, but a few incontinent episodes.  Discussed with patient this a.m.  Urinary cultures Keflex sensitive.   - 7/4: Ongoing urge and stress incontinence, with borderline retention. Concerned for cholinergic s/e with ongoing confusion, lethargy. DC ditropan, switch to myrbetriq in AM.   7-5: Required ISC 1 time overnight.  Switching from Ditropan to Myrbetriq today; so far PVRs remain low.  If further ISC use this weekend, would discontinue  Myrbetriq  17. OSA. Per patient recently sold her CPAP machine.   Discussed RT consult for inpatient use, patient refuses, states she was unable to tolerate it and is not willing to try again.  Will follow-up.  -7-3: Still refusing CPAP, despite significant daytime lethargy.  7-5: Patient agreeable to trial of CPAP; RT consulted   LOS: 10 days A FACE TO FACE EVALUATION WAS PERFORMED  Erick Colace 09/24/2022, 8:37 AM

## 2022-09-25 LAB — GLUCOSE, CAPILLARY
Glucose-Capillary: 138 mg/dL — ABNORMAL HIGH (ref 70–99)
Glucose-Capillary: 220 mg/dL — ABNORMAL HIGH (ref 70–99)
Glucose-Capillary: 155 mg/dL — ABNORMAL HIGH (ref 70–99)
Glucose-Capillary: 185 mg/dL — ABNORMAL HIGH (ref 70–99)

## 2022-09-25 NOTE — Progress Notes (Signed)
PROGRESS NOTE   Subjective/Complaints:  Initially did not memebewr meeting me yesterday  Aware of d/c date  ROS: Denies CP, SOB, N/V/D Objective:   No results found. No results for input(s): "WBC", "HGB", "HCT", "PLT" in the last 72 hours.  No results for input(s): "NA", "K", "CL", "CO2", "GLUCOSE", "BUN", "CREATININE", "CALCIUM" in the last 72 hours.    Intake/Output Summary (Last 24 hours) at 09/25/2022 0811 Last data filed at 09/25/2022 0026 Inch per 24 hour  Intake 480 ml  Output 2009 ml  Net -1529 ml         Physical Exam: Vital Signs Blood pressure (!) 136/52, pulse 60, temperature 98.1 F (36.7 C), temperature source Oral, resp. rate 18, height 5\' 9"  (1.753 m), weight 103 kg, SpO2 98 %.   General: No acute distress Mood and affect are appropriate Heart: Regular rate and rhythm no rubs murmurs or extra sounds Lungs: Clear to auscultation, breathing unlabored, no rales or wheezes Abdomen: Positive bowel sounds, soft nontender to palpation, nondistended Extremities: No clubbing, cyanosis, or edema Skin: No evidence of breakdown, no evidence of rash Neurologic: Cranial nerves II through XII intact, motor strength is 4/5 in bilateral deltoid, bicep, tricep, grip, 4- RIght and 3- left  hip flexor, knee extensors, ankle dorsiflexor and plantar flexor Sensory exam absent LT ensation below knees bilaterally   Musculoskeletal: Full range of motion in all 4 extremities. No joint swelling  Extremities: 2+ nonpitting edema in bilateral lower extremities.  -Unchanged Psych: Pt's affect is flat, mildly depressed.  Lethargic. Skin:  + heel wounds appear to be healing compared to last admission, left heel wound remains mildly open.  A Mepilex bilaterally-appear to be healing 7-3 + Stage II to coccyx on admission                  Assessment/Plan: 1. Functional deficits which require 3+ hours per day of  interdisciplinary therapy in a comprehensive inpatient rehab setting. Physiatrist is providing close team supervision and 24 hour management of active medical problems listed below. Physiatrist and rehab team continue to assess barriers to discharge/monitor patient progress toward functional and medical goals  Care Tool:  Bathing    Body parts bathed by patient: Right arm, Left arm, Chest, Abdomen, Front perineal area, Right upper leg, Left upper leg, Face   Body parts bathed by helper: Buttocks, Left lower leg, Right lower leg     Bathing assist Assist Level: Moderate Assistance - Patient 50 - 74%     Upper Body Dressing/Undressing Upper body dressing   What is the patient wearing?: Pull over shirt    Upper body assist Assist Level: Minimal Assistance - Patient > 75%    Lower Body Dressing/Undressing Lower body dressing      What is the patient wearing?: Pants, Underwear/pull up     Lower body assist Assist for lower body dressing: Maximal Assistance - Patient 25 - 49%     Toileting Toileting    Toileting assist Assist for toileting: Moderate Assistance - Patient 50 - 74%     Transfers Chair/bed transfer  Transfers assist     Chair/bed transfer assist level: Minimal Assistance - Patient >  75%     Locomotion Ambulation   Ambulation assist      Assist level: Minimal Assistance - Patient > 75% Assistive device: Walker-rolling Max distance: 67'   Walk 10 feet activity   Assist     Assist level: Minimal Assistance - Patient > 75% Assistive device: Walker-rolling   Walk 50 feet activity   Assist Walk 50 feet with 2 turns activity did not occur: Safety/medical concerns (Patient unable to ambulate >15' at this time secondary to poor endurance/activity tolerance)  Assist level: Minimal Assistance - Patient > 75% Assistive device: Walker-rolling    Walk 150 feet activity   Assist Walk 150 feet activity did not occur: Safety/medical concerns          Walk 10 feet on uneven surface  activity   Assist Walk 10 feet on uneven surfaces activity did not occur: Safety/medical concerns         Wheelchair     Assist Is the patient using a wheelchair?: Yes Type of Wheelchair: Manual    Wheelchair assist level: Dependent - Patient 0% Max wheelchair distance: Therapist pushed wheelchair due to poor endurance and shoulder pain    Wheelchair 50 feet with 2 turns activity    Assist        Assist Level: Dependent - Patient 0%   Wheelchair 150 feet activity     Assist      Assist Level: Dependent - Patient 0%   Blood pressure (!) 136/52, pulse 60, temperature 98.1 F (36.7 C), temperature source Oral, resp. rate 18, height 5\' 9"  (1.753 m), weight 103 kg, SpO2 98 %.  1. Functional deficits secondary to left basal ganglia and left parietal small/punctate infarcts as well as history of right MCA infarction.   Current weakness from prior CVA           -patient may shower             -ELOS/Goals: 7 to 10 days, supervision with PT and OT goals - 7/11 DC date   2.  Antithrombotics: -DVT/anticoagulation:  Pharmaceutical: Lovenox             -antiplatelet therapy: Aspirin 81 mg daily and Plavix 75 mg daily x 3 weeks then Plavix alone 3. Pain Management: Voltaren gel 4 g 4 times daily, Tylenol as needed.   - Added K-pad for right shoulder pain. 4. Mood/Behavior/Sleep: Wellbutrin 300 mg daily, Lexapro 20 mg daily, melatonin as needed             -antipsychotic agents: N/A   - 7/2: Discussed with the patient, agreeable to increase antidepressant medications.  Already on max dose Lexapro, will transition from Wellbutrin 300 mg XR to Wellbutrin 200 mg SR twice daily.  7-5: Patient agreeable to trial of CPAP, RT consulted  5. Neuropsych/cognition: This patient is capable of making decisions on her own behalf. 6. Skin/Wound Care:/Bilateral diabetic foot ulcers.  Skin care as directed routine skin checks  -Mepilex to foot  ulcers, Prevalon boots at nighttime  -Protein supplementation, daily Zinc 220 mg and Vitamin C 500 mg supplementation   7. Fluids/Electrolytes/Nutrition: Routine in and outs with follow-up chemistries  Filed Weights   09/14/22 1814 09/21/22 1049  Weight: 100 kg 103 kg    8.  Diabetes mellitus with peripheral neuropathy.  Latest hemoglobin A1c 7.0.  NovoLog Mix 70/30 20 units twice daily             -Prior use of metformin, to restart if needed  -Monitor  today, blood glucose mildly elevated overnight.  6/29- will increase 70/30 to 22 units BID since running 140s to 212- since Cr elevated, don't feel comfortable starting Metformin.   6/30- much better since increased 70/30- down to 110s-120s from 200's; STABLE 7/7 Recent Labs    09/24/22 1648 09/24/22 2101 09/25/22 0605  GLUCAP 145* 155* 138*      9.  Hypertension.  Coreg 3.125 mg twice daily, Norvasc 5 mg daily (half home dose).  Monitor with increased mobility.  Long-term BP goal normotensive.  Avoid hypotension/dehydration.  - 6/27: Hypertensive 170s overnight, neurology recommending higher BP goals due to orthostasis.  Will order as needed hydralazine 10 mg every 8 hours for SBP greater than 180, DBP greater than 110.  Teds and abdominal binder ordered.   - 6/28: 150s to 170s today, BS adequate, increase Norvasc to 10 mg   - 7/2: Resume Losartan at 25 mg daily -will wait 1 to 2 days to see effect; had creatinine this a.m. 1.6 mildly elevated from last take but within baseline as below  - 7/5: BMP not obtained this a.m., will hold off on losartan increase due to normotension and check on Monday, mildly increase sys BP monitor prior to dose change    09/25/2022    4:20 AM 09/24/2022    6:59 PM 09/24/2022    3:36 PM  Vitals with BMI  Systolic 136 150 161  Diastolic 52 53 54  Pulse 60 70 62    10.  Obesity.  BMI 33.52.  Dietary follow-up 11.  CKD IIIa, creatinine baseline 1.57-1.84.  Follow-up chemistries  -Creatinine 1.5 this a.m.,  at baseline, monitor -repeat Monday   12.  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  Follow-up outpatient Dr Pamelia Hoit.  Continue Nolvadex. 13.  Hyperlipidemia.  Lipitor 80 mg daily 14.  Right shoulder pain             -Improved, continue Voltaren gel    15. Constipation.  No recorded bowel movement since admission.  Ordered senna 1 tab nightly, MiraLAX daily  - Small BM 6/28; monitor  First decent BM 6/28 per pt.   - LBM 6/30, medium  16.  Urinary urgency/retention/UTI Start Ditropan 2.5 mg twice daily.   6/29- Pt's PVRs up to 200s- 369 and another value of 325cc- Has not been cathed so far- has been able to void.   6/30- will check U/A and Cx- since malodorous and still retaining- last checked 6/22 and had (+) Nitrites and small leuks- likely has become UTI? Pending at this time.   7/1 - UA appears UTI, Cx pending, culture from 6-22 showing pansensitive E. Coli; initially determine asymptomatic bacteria, however patient was slowed cognition, increased confusion, and increased urinary urgency from last hospitalization.  Will start Keflex 500 mg BID for 7 days; allergic to Bactrim and Cipro. Still 200-300 on bladder scans.   7-3: Bladder scans lower overnight, but a few incontinent episodes.  Discussed with patient this a.m.  Urinary cultures Keflex sensitive.   - 7/4: Ongoing urge and stress incontinence, with borderline retention. Concerned for cholinergic s/e with ongoing confusion, lethargy. DC ditropan, switch to myrbetriq in AM.   7-5: Required ISC 1 time overnight.  Switching from Ditropan to Myrbetriq today; so far PVRs remain low.  If further ISC use this weekend, would discontinue Myrbetriq  17. OSA. Per patient recently sold her CPAP machine.  Discussed RT consult for inpatient use,  patient refuses, states she was unable to tolerate it and is not willing to try again.  Will follow-up.  -7-3: Still refusing CPAP,  despite significant daytime lethargy.  7-5: Patient agreeable to trial of CPAP; RT consulted   LOS: 11 days A FACE TO FACE EVALUATION WAS PERFORMED  Erick Colace 09/25/2022, 8:11 AM

## 2022-09-25 NOTE — Discharge Summary (Signed)
Physician Discharge Summary  Patient ID: [name] MRN: [id number] DOB/AGE: March 10, 1951 72 y.o.  Admit date: 09/14/2022 Discharge date: 09/30/2022  Discharge Diagnoses:  Principal Problem:   Infarction of left basal ganglia (HCC) DVT prophylaxis Mood stabilization Diabetes mellitus with peripheral neuropathy Hypertension Obesity CKD stage III History of ductal carcinoma in situ of left breast Hyperlipidemia Constipation UTI/Retention Suspect OSA  Discharged Condition: Stable  Significant Diagnostic Studies: US RENAL  Result Date: 09/28/2022 CLINICAL DATA:  Urinary retention EXAM: RENAL / URINARY TRACT ULTRASOUND COMPLETE COMPARISON:  None Available. FINDINGS: Right Kidney: Renal measurements: 11.7 x 5.3 x 5.9 cm = volume: 189.3 mL. Echogenicity within normal limits. No mass or hydronephrosis visualized. Left Kidney: Renal measurements: 9.3 x 5.9 x 5.1 cm = volume: 146.2 mL. Echogenicity within normal limits. No mass or hydronephrosis visualized. Bladder: Appears normal for degree of bladder distention. Other: None. IMPRESSION: Normal examination. Electronically Signed   By: Jacob Moores M.D.   On: 09/28/2022 13:09   DG Abd 1 View  Result Date: 09/28/2022 CLINICAL DATA:  Constipation EXAM: ABDOMEN - 1 VIEW COMPARISON:  None Available. FINDINGS: Scattered large and small bowel gas is noted. Fecal material is noted throughout the colon consistent with constipation. No rectal impaction is seen. No free air is noted. No bony abnormality is noted. IMPRESSION: Changes consistent with colonic constipation. Electronically Signed   By: Alcide Clever M.D.   On: 09/28/2022 12:14   EP PPM/ICD IMPLANT  Result Date: 09/14/2022 CONCLUSIONS:  1. Successful implantation of a implantable loop recorder for Cryptogenic stroke  2. No early apparent complications.   VAS [place] LOWER EXTREMITY VENOUS (DVT)  Result Date: 09/11/2022  Lower Venous DVT Study Patient Name:  [name]  Date of Exam:    09/11/2022 Medical Rec #: [id number]        Accession #:    [id number] Date of Birth: 11-13-1950        Patient Gender: F Patient Age:   62 years Exam Location:  [facility] Procedure:      VAS [place] LOWER EXTREMITY VENOUS (DVT) Referring Phys: Lanae Boast --------------------------------------------------------------------------------  Indications: Edema, and stroke.  Limitations: Body habitus and patient unable to position legs. Comparison Study: Negative left LEV done 09/13/17 Performing Technologist: Sherren Kerns RVS  Examination Guidelines: A complete evaluation includes B-mode imaging, spectral Doppler, color Doppler, and power Doppler as needed of all accessible portions of each vessel. Bilateral testing is considered an integral part of a complete examination. Limited examinations for reoccurring indications may be performed as noted. The reflux portion of the exam is performed with the patient in reverse Trendelenburg.  +---------+---------------+---------+-----------+----------+-------------------+ RIGHT    CompressibilityPhasicitySpontaneityPropertiesThrombus Aging      +---------+---------------+---------+-----------+----------+-------------------+ CFV      Full           Yes      Yes                                      +---------+---------------+---------+-----------+----------+-------------------+ SFJ      Full                                                             +---------+---------------+---------+-----------+----------+-------------------+ FV Prox  Full                                                             +---------+---------------+---------+-----------+----------+-------------------+  FV Mid   Full           Yes      Yes                                      +---------+---------------+---------+-----------+----------+-------------------+ FV DistalFull                                                              +---------+---------------+---------+-----------+----------+-------------------+ PFV      Full                                                             +---------+---------------+---------+-----------+----------+-------------------+ POP                     Yes      Yes                  patent by color and                                                       Doppler             +---------+---------------+---------+-----------+----------+-------------------+ PTV                                                   Not well visualized +---------+---------------+---------+-----------+----------+-------------------+ PERO                                                  Not well visualized +---------+---------------+---------+-----------+----------+-------------------+   +---------+---------------+---------+-----------+----------+-------------------+ LEFT     CompressibilityPhasicitySpontaneityPropertiesThrombus Aging      +---------+---------------+---------+-----------+----------+-------------------+ CFV      Full           Yes      Yes                                      +---------+---------------+---------+-----------+----------+-------------------+ SFJ      Full                                                             +---------+---------------+---------+-----------+----------+-------------------+ FV Prox  Full                                                             +---------+---------------+---------+-----------+----------+-------------------+  FV Mid   Full                                                             +---------+---------------+---------+-----------+----------+-------------------+ FV DistalFull                                                             +---------+---------------+---------+-----------+----------+-------------------+ PFV      Full                                                              +---------+---------------+---------+-----------+----------+-------------------+ POP                                                   patent by color and                                                       Doppler             +---------+---------------+---------+-----------+----------+-------------------+ PTV                                                   Not well visualized +---------+---------------+---------+-----------+----------+-------------------+ PERO                                                  Not well visualized +---------+---------------+---------+-----------+----------+-------------------+     Summary: BILATERAL: - No evidence of deep vein thrombosis seen in the lower extremities, bilaterally. -No evidence of popliteal cyst, bilaterally.   *See table(s) above for measurements and observations. Electronically signed by Waverly Ferrari MD on 09/11/2022 at 8:39:28 PM.    Final    CT ANGIO HEAD NECK W WO CM  Result Date: 09/11/2022 CLINICAL DATA:  Stroke, determine embolic source. EXAM: CT ANGIOGRAPHY HEAD AND NECK WITH AND WITHOUT CONTRAST TECHNIQUE: Multidetector CT imaging of the head and neck was performed using the standard protocol during bolus administration of intravenous contrast. Multiplanar CT image reconstructions and MIPs were obtained to evaluate the vascular anatomy. Carotid stenosis measurements (when applicable) are obtained utilizing NASCET criteria, using the distal internal carotid diameter as the denominator. RADIATION DOSE REDUCTION: This exam was performed according to the departmental dose-optimization program which includes automated exposure control, adjustment of the mA and/or kV according to patient size and/or use of iterative reconstruction technique. CONTRAST:  75mL  OMNIPAQUE IOHEXOL 350 MG/ML SOLN COMPARISON:  Brain MRI from yesterday FINDINGS: CT HEAD FINDINGS Brain: Small acute infarcts are largely occult compared to brain MRI.  Multiple chronic infarcts including along the bilateral frontal parietal cortex (more extensive on the left) and at the left corona radiata. Small chronic cerebellar infarcts. No acute hemorrhage, hydrocephalus, or masslike finding. Vascular: No hyperdense vessel or unexpected calcification. Skull: Normal. Negative for fracture or focal lesion. Sinuses/Orbits: No acute finding Review of the MIP images confirms the above findings CTA NECK FINDINGS Aortic arch: Atheromatous plaque with 3 vessel branching. Right carotid system: Atheromatous calcification at the carotid bifurcation. No flow reducing stenosis or ulceration. Left carotid system: Moderate mixed density plaque primarily at the bifurcation. No stenosis or ulceration. Vertebral arteries: No proximal subclavian stenosis although there is atherosclerosis on the right. The vertebral arteries are smoothly contoured and widely patent Skeleton: C5-6 ACDF with solid arthrodesis. Other neck: No acute finding Upper chest: No acute finding Review of the MIP images confirms the above findings CTA HEAD FINDINGS Anterior circulation: No branch occlusion, beading, or aneurysm. Atheromatous calcification along the carotid siphons. Hypoplastic right A1 segment Posterior circulation: The vertebral and basilar arteries are smoothly contoured and widely patent. No branch occlusion, beading, or aneurysm. Venous sinuses: Diffusely patent Anatomic variants: As above Review of the MIP images confirms the above findings IMPRESSION: No emergent vascular finding. There is atherosclerosis without flow reducing stenosis or ulceration of major arteries in the head and neck. Electronically Signed   By: Tiburcio Pea M.D.   On: 09/11/2022 10:22   MR ANGIO HEAD WO CONTRAST  Result Date: 09/10/2022 CLINICAL DATA:  Stroke/TIA, determine embolic source EXAM: MRA HEAD WITHOUT CONTRAST TECHNIQUE: Angiographic images of the Circle of Willis were acquired using MRA technique without  intravenous contrast. COMPARISON:  MRA head 08/09/22 FINDINGS: Anterior circulation: No significant stenosis. No proximal occlusion. No aneurysm. Posterior circulation: No significant stenosis. No proximal occlusion. No aneurysm. Anatomic variants: No Other: None. IMPRESSION: No intracranial large vessel occlusion or significant stenosis. Consider further evaluation with an MRA of the neck, this is not previously performed Electronically Signed   By: Lorenza Cambridge M.D.   On: 09/10/2022 18:51   MR BRAIN WO CONTRAST  Result Date: 09/10/2022 CLINICAL DATA:  Neuro deficit, acute, stroke suspected EXAM: MRI HEAD WITHOUT CONTRAST TECHNIQUE: Multiplanar, multiecho pulse sequences of the brain and surrounding structures were obtained without intravenous contrast. COMPARISON:  MR Brain 08/08/22 FINDINGS: Brain: Acute infarcts in the bilateral corona radiata and in the left parietal lobe, new compared to 08/08/2022. there is chronic infarct in the posterior left parietal lobe, posterior right parietal lobe, and in the right cerebellum. No hemorrhage. No extra-axial fluid collection. No hydrocephalus. There is sequela of severe chronic microvascular ischemic change. Vascular: Normal flow voids. Skull and upper cervical spine: Normal marrow signal. Sinuses/Orbits: No middle ear or mastoid effusion. Paranasal sinuses are clear. Bilateral lens replacement. Orbits are otherwise unremarkable Other: None. IMPRESSION: Acute infarcts in the bilateral corona radiata and in the left parietal lobe, new compared to 08/08/2022. No hemorrhage. Electronically Signed   By: Lorenza Cambridge M.D.   On: 09/10/2022 12:00   DG Chest 1 View  Result Date: 09/10/2022 CLINICAL DATA:  Right-sided weakness.  Right-sided chest pain. EXAM: CHEST  1 VIEW COMPARISON:  08/08/2022 FINDINGS: The lungs are clear without focal pneumonia, edema, pneumothorax or pleural effusion. The cardiopericardial silhouette is within normal limits for size. No acute bony  abnormality. Telemetry leads overlie the chest.  IMPRESSION: No active disease. Electronically Signed   By: Kennith Center M.D.   On: 09/10/2022 09:04   CT Head Wo Contrast  Result Date: 09/10/2022 CLINICAL DATA:  History follow-up.  Abnormal mental status. EXAM: CT HEAD WITHOUT CONTRAST TECHNIQUE: Contiguous axial images were obtained from the base of the skull through the vertex without intravenous contrast. RADIATION DOSE REDUCTION: This exam was performed according to the departmental dose-optimization program which includes automated exposure control, adjustment of the mA and/or kV according to patient size and/or use of iterative reconstruction technique. COMPARISON:  Brain MRI 08/08/2022 FINDINGS: Brain: No evidence of acute infarction, hemorrhage, hydrocephalus, extra-axial collection or mass lesion/mass effect. Chronic bifrontal and left parietal cortex infarcts. Small vessel infarcts in the deep gray nuclei, reference recent brain MRI showing areas of acuity. Vascular: No hyperdense vessel or unexpected calcification. Skull: Normal. Negative for fracture or focal lesion. Sinuses/Orbits: No acute finding. IMPRESSION: 1. No acute or reversible finding. 2. Chronic and subacute infarcts without noted progression from brain MRI 08/08/2022 Electronically Signed   By: Tiburcio Pea M.D.   On: 09/10/2022 08:39    Labs:  Basic Metabolic Panel: Recent Labs  Lab 09/26/22 0730 09/27/22 1533 09/28/22 0735 09/29/22 0734  NA 136 134*  --  136  K 3.9 4.0  --  3.8  CL 103 99  --  105  CO2 26 25  --  24  GLUCOSE 201* 184*  --  153*  BUN 35* 42*  --  38*  CREATININE 1.40* 1.71* 1.41* 1.52*  CALCIUM 8.7* 9.1  --  8.6*    CBC: Recent Labs  Lab 09/27/22 1157  WBC 5.5  NEUTROABS 3.2  HGB 11.2*  HCT 32.8*  MCV 97.0  PLT 210    CBG: Recent Labs  Lab 09/28/22 2028 09/29/22 0652 09/29/22 1133 09/29/22 1630 09/29/22 2107  GLUCAP 212* 95 138* 147* 179*   Family history.  Mother with  hypertension as well as Alzheimer's disease.  Father with CAD and Alzheimer's disease.  Denies any colon cancer esophageal cancer or rectal cancer  Brief HPI:   TYNETTA BACHMANN is a 72 y.o. right-handed female with history of ductal carcinoma in situ of left breast status post lumpectomy with radioactive seed implant 2021, CKD with creatinine 1.5-1.84, diabetes mellitus hypertension, obesity with BMI 33.52, hyperlipidemia as well as history of right middle cerebral artery infarction maintained on aspirin and Plavix receiving inpatient rehab services 08/12/2022 - 08/25/2022 discharge to home ambulating 150 feet.  Discharge plan is to home with her sister with assistance as needed.  Presented 09/10/2023 for acute onset of right-sided weakness.  MRI showed acute infarct in the bilateral corona radiata and in the left parietal lobe without hemorrhage.  MRA showed no intracranial large vessel occlusion or significant stenosis.  CT angiogram of head and neck with no emergent vessel vascular findings.  Most recent echocardiogram with ejection fraction of 60 to 65% no wall motion abnormalities grade 1 diastolic dysfunction.  Neurology consulted maintained on aspirin as well as Plavix x 3 weeks then Plavix alone.  Lovenox for DVT prophylaxis.  Plan for loop recorder placement.  Hospital course follow-up podiatry services for chronic diabetic foot ulcers bilaterally with wound care as directed.  Patient reports having some right shoulder pain due to overuse this improved with Voltaren gel.  Tolerating a regular diet.  Therapy evaluations completed due to patient decreased functional mobility right side weakness was admitted for a comprehensive rehab program.   Hospital Course: [facility]  A [place] was admitted to rehab 09/14/2022 for inpatient therapies to consist of PT, ST and OT at least three hours five days a week. Past admission physiatrist, therapy team and rehab RN have worked together to provide customized collaborative  inpatient rehab.  Pertaining to patient's left basal ganglia and left parietal small punctate infarct as well as history of right MCA infarction remained stable she will continue on aspirin and Plavix x 3 weeks then Plavix alone.  Patient did undergo a loop recorder placement.  Pain management with shoulder discomfort maintained on Voltaren gel as well as heating pad.  Mood stabilization with Wellbutrin as well as Lexapro and neuropsych follow-up.  Ritalin was added to help initiate focus and attention to tasks.  Skin care follow-up for wound care nurse for bilateral diabetic foot ulcers followed by podiatry services wound care as directed.  Patient was maintained on zinc sulfate to help with wound healing.  Blood sugars monitored hemoglobin A1c 7.0 insulin therapy as directed.  Blood pressure controlled and monitored on Cozaar/Norvasc's as well as Coreg and would need outpatient follow-up.  Lovenox ongoing during hospital course for DVT prophylaxis no bleeding episodes.  CKD stage III creatinine baseline 1.7-1.84 with follow-up chemistries with latest creatinine 1.41 and renal ultrasound unremarkable.  History of ductal carcinoma in situ of left breast status post lumpectomy and radioactive seed implant 2021.  Follow-up outpatient Dr. Pamelia Hoit and continue Nolvadex.  Lipitor ongoing for hyperlipidemia.  Bouts of constipation resolved with laxative assistance.  Hospital course urgency E. coli UTI completed course of antibiotic therapy.  Bouts of urge and stress incontinence initially on Ditropan transition to Myrbetriq and monitored then developed retention requiring catherization felt to be possibly due to wellbutrin  serotonergic side effects which was decreased and stopped and flomax added titrated to 0.8  mg as well as Urecholine and latest bladder scans much improved.  Bouts of constipation KUB showed stool burden respond well to 2 enema.  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  She would need outpatient sleep study.   Blood pressures were monitored on TID basis and controlled  Diabetes has been monitored with ac/hs CBG checks and SSI was use prn for tighter BS control.    Rehab course: During patient's stay in rehab weekly team conferences were held to monitor patient's progress, set goals and discuss barriers to discharge. At admission, patient required minimal assist 120 feet rolling walker moderate assist sit to stand  Physical exam.  Blood pressure 146/61 pulse 64 temp 97.9 respirations 12 oxygen saturation 98% room air Constitutional.  No acute distress HEENT Head.  Normocephalic and atraumatic Eyes.  Pupils round and reactive to light no discharge without nystagmus Neck.  Supple nontender no JVD without thyromegaly Cardiac regular rate and rhythm without any extra sounds or murmur heard Abdomen.  Soft nontender positive bowel sounds without rebound Respiratory effort normal no respiratory distress without wheeze Neurologic.  Alert and oriented x 3.  Occasional word finding difficulties. Strength bilateral lower extremities 3/5 hip flexion 4 -/5 knee extension 4- out of 5 ankle dorsi plantarflexion Left upper extremity 4 out of 5 shoulder abduction, elbow flexion and extension, finger flexion Right upper extremity 4- out of 5 shoulder abduction 4 out of 5 elbow flexion extension finger flexion DTRs 2+      Media Information   Document Information  Photos  Right heel  09/14/2022 18:50  Attached To:  Hospital Encounter on 09/14/22  Source Information  Pugh, Armandina Stammer, RN  [facility]   Media Information   Document Information  Photos  Coccyx  09/14/2022 18:56  Attached To:  Hospital Encounter on 09/14/22  Source Information  Pugh, Armandina Stammer, RN  [facility]   Media Information   Document Information  Photos  Right lateral foot  09/14/2022 19:29  Attached To:  Hospital Encounter on 09/14/22   Source Information  Pugh, Armandina Stammer, RN  [facility]   Media Information   Document Information  Photos  Left lateral posterior heel  09/14/2022 19:30  Attached To:  Hospital Encounter on 09/14/22  Source Information  Pugh, Armandina Stammer, RN  [facility]   He/She  has had improvement in activity tolerance, balance, postural control as well as ability to compensate for deficits. He/She has had improvement in functional use RUE/LUE  and RLE/LLE as well as improvement in awareness.  Patient currently supervision minimal assist for bed mobility contact-guard standby assist for sit to stand ambulates 140 feet with use of a rolling walker contact-guard with left Darco boot.  Patient is currently able to complete upper body dressing supervision lower body dressing minimal assist upper body bathing with supervision lower body bathing and minimal assist toilet task with contact-guard with functional mobility with the use of a walker contact-guard.  Full family teaching completed plan discharge to home       Disposition: Discharge to home    Diet: Diabetic diet  Special Instructions: No driving smoking or alcohol  Daily dressing changes bilateral feet.  Apply Xeroform to wounds.  Apply Mepilex dressing overlying the Xeroform and ulcers  Medications at discharge 1.  Tylenol as needed 2.  Norvasc 10 mg p.o. daily 3.  Vitamin C 500 mg p.o. daily 4.  Lipitor 80 mg p.o. daily 5.  Glucophage 500 mg daily 6.  Coreg 3.125 mg p.o. twice daily 7.  Voltaren gel 4 g 4 times daily to affected area 8.  Lexapro 20 mg p.o. daily 9.  NovoLog Mix 70/30 25 units twice daily 10.  Cozaar 25 mg p.o. daily 11.  Ritalin 5 mg BID 12.  MiraLAX 17 g p.o. daily 13.  Senokot 1 tablet nightly 14.  Nolvadex 20 mg p.o. daily 15.  Zinc sulfate 220 mg p.o. daily 16.  Plavix 75 mg p.o. daily 17.  Aspirin 81 mg daily until 10/02/2022 and stop 18.  Vitamin D 5000 units every evening   30-35 minutes  were spent completing discharge summary and discharge planning   Discharge Instructions     Ambulatory referral to Neurology   Complete by: As directed    An appointment is requested in approximately: 4 weeks left basal ganglia infarction   Ambulatory referral to Physical Medicine Rehab   Complete by: As directed    Moderate complexity follow-up 1 to 2 weeks left basal ganglia infarction        Follow-up Information     Elijah Birk C, DO Follow up.   Specialty: Physical Medicine and Rehabilitation Why: Office to call for appointment Contact information: [address] [place] [address] [phone]         Lanier Prude, MD Follow up.   Specialties: Cardiology, Radiology Why: Call for appointment Contact information: [address] [place] [address] [phone]         Felecia Shelling, DPM Follow up.  Specialty: Podiatry Why: Call for appointment Contact information: [address] [place] [address] [phone]                 Signed: Mcarthur Rossetti Brittnie Lewey 09/30/2022, 5:00 AM

## 2022-09-26 LAB — BASIC METABOLIC PANEL
Anion gap: 7 (ref 5–15)
BUN: 35 mg/dL — ABNORMAL HIGH (ref 8–23)
CO2: 26 mmol/L (ref 22–32)
Calcium: 8.7 mg/dL — ABNORMAL LOW (ref 8.9–10.3)
Chloride: 103 mmol/L (ref 98–111)
Creatinine, Ser: 1.4 mg/dL — ABNORMAL HIGH (ref 0.44–1.00)
GFR, Estimated: 40 mL/min — ABNORMAL LOW (ref >60)
Glucose, Bld: 201 mg/dL — ABNORMAL HIGH (ref 70–99)
Potassium: 3.9 mmol/L (ref 3.5–5.1)
Sodium: 136 mmol/L (ref 135–145)

## 2022-09-26 LAB — GLUCOSE, CAPILLARY
Glucose-Capillary: 150 mg/dL — ABNORMAL HIGH (ref 70–99)
Glucose-Capillary: 179 mg/dL — ABNORMAL HIGH (ref 70–99)
Glucose-Capillary: 194 mg/dL — ABNORMAL HIGH (ref 70–99)
Glucose-Capillary: 262 mg/dL — ABNORMAL HIGH (ref 70–99)

## 2022-09-26 MED ORDER — METHYLPHENIDATE HCL 5 MG PO TABS
5.0000 mg | ORAL_TABLET | Freq: Two times a day (BID) | ORAL | Status: DC
  Administered 2022-09-27 – 2022-09-30 (×7): 5 mg via ORAL
  Filled 2022-09-26 (×6): qty 1

## 2022-09-26 MED ORDER — TAMSULOSIN HCL 0.4 MG PO CAPS
0.4000 mg | ORAL_CAPSULE | Freq: Every day | ORAL | Status: DC
  Administered 2022-09-26: 0.4 mg via ORAL
  Filled 2022-09-26: qty 1

## 2022-09-26 NOTE — Progress Notes (Signed)
PROGRESS NOTE   Subjective/Complaints:   Vitals stable Last BM 7/7 High bladder scan volumets overnight over weekend requiring intermittent ISC.  Patient endorses nighttime interruption for straight cathing has made sleep difficult. PT at bedside, noting in general significantly worsening cognitive deficits and poor retention this admission, poor carryover. Patient agrees she is having trouble concentrating or remembering tasks.    ROS: Denies fevers, chills, N/V, abdominal pain, constipation, diarrhea, SOB, cough, chest pain, new weakness or paraesthesias.   + Lethargy + Memory deficits + Urinary retention  Objective:   No results found. No results for input(s): "WBC", "HGB", "HCT", "PLT" in the last 72 hours.  No results for input(s): "NA", "K", "CL", "CO2", "GLUCOSE", "BUN", "CREATININE", "CALCIUM" in the last 72 hours.    Intake/Output Summary (Last 24 hours) at 09/26/2022 0806 Last data filed at 09/26/2022 0647 Mcneff per 24 hour  Intake 240 ml  Output 1400 ml  Net -1160 ml         Physical Exam: Vital Signs Blood pressure (!) 138/56, pulse (!) 58, temperature (!) 97.5 F (36.4 C), temperature source Oral, resp. rate 18, height 5\' 9"  (1.753 m), weight 103 kg, SpO2 97 %.   General: No acute distress. Sitting up in [facility] with PT at bedside.  Mood and affect are depressed, flat.  Heart: Regular rate and rhythm no rubs murmurs or extra sounds Lungs: Clear to auscultation, breathing unlabored, no rales or wheezes Abdomen: Positive bowel sounds, soft nontender to palpation, nondistended Neurologic: Cranial nerves II through XII intact, motor strength is 4/5 in bilateral deltoid, bicep, tricep, grip, 4- RIght and 3- left  hip flexor, knee extensors, ankle dorsiflexor and plantar flexor Sensory exam absent LT ensation below knees bilaterally  Musculoskeletal: Full range of motion in all 4 extremities. No joint  swelling  Extremities: 2+ nonpitting edema in bilateral lower extremities.  -Unchanged Skin:  + heel wounds appear to be healing compared to last admission, left heel wound remains mildly open.  A Mepilex bilaterally-appear to be healing 7-3 + Stage II to coccyx on admission                  Assessment/Plan: 1. Functional deficits which require 3+ hours per day of interdisciplinary therapy in a comprehensive inpatient rehab setting. Physiatrist is providing close team supervision and 24 hour management of active medical problems listed below. Physiatrist and rehab team continue to assess barriers to discharge/monitor patient progress toward functional and medical goals  Care Tool:  Bathing    Body parts bathed by patient: Right arm, Left arm, Chest, Abdomen, Front perineal area, Right upper leg, Left upper leg, Face   Body parts bathed by helper: Buttocks, Left lower leg, Right lower leg     Bathing assist Assist Level: Moderate Assistance - Patient 50 - 74%     Upper Body Dressing/Undressing Upper body dressing   What is the patient wearing?: Pull over shirt    Upper body assist Assist Level: Minimal Assistance - Patient > 75%    Lower Body Dressing/Undressing Lower body dressing      What is the patient wearing?: Pants, Underwear/pull up     Lower body assist  Assist for lower body dressing: Maximal Assistance - Patient 25 - 49%     Toileting Toileting    Toileting assist Assist for toileting: Moderate Assistance - Patient 50 - 74%     Transfers Chair/bed transfer  Transfers assist     Chair/bed transfer assist level: Minimal Assistance - Patient > 75%     Locomotion Ambulation   Ambulation assist      Assist level: Minimal Assistance - Patient > 75% Assistive device: Walker-rolling Max distance: 12'   Walk 10 feet activity   Assist     Assist level: Minimal Assistance - Patient > 75% Assistive device: Walker-rolling   Walk  50 feet activity   Assist Walk 50 feet with 2 turns activity did not occur: Safety/medical concerns (Patient unable to ambulate >15' at this time secondary to poor endurance/activity tolerance)  Assist level: Minimal Assistance - Patient > 75% Assistive device: Walker-rolling    Walk 150 feet activity   Assist Walk 150 feet activity did not occur: Safety/medical concerns         Walk 10 feet on uneven surface  activity   Assist Walk 10 feet on uneven surfaces activity did not occur: Safety/medical concerns         Wheelchair     Assist Is the patient using a wheelchair?: Yes Type of Wheelchair: Manual    Wheelchair assist level: Dependent - Patient 0% Max wheelchair distance: Therapist pushed wheelchair due to poor endurance and shoulder pain    Wheelchair 50 feet with 2 turns activity    Assist        Assist Level: Dependent - Patient 0%   Wheelchair 150 feet activity     Assist      Assist Level: Dependent - Patient 0%   Blood pressure (!) 138/56, pulse (!) 58, temperature (!) 97.5 F (36.4 C), temperature source Oral, resp. rate 18, height 5\' 9"  (1.753 m), weight 103 kg, SpO2 97 %.  1. Functional deficits secondary to left basal ganglia and left parietal small/punctate infarcts as well as history of right MCA infarction.   Current weakness from prior CVA  -patient may shower             -ELOS/Goals: 7 to 10 days, supervision with PT and OT goals - 7/11 DC date   2.  Antithrombotics: -DVT/anticoagulation:  Pharmaceutical: Lovenox             -antiplatelet therapy: Aspirin 81 mg daily and Plavix 75 mg daily x 3 weeks then Plavix alone 3. Pain Management: Voltaren gel 4 g 4 times daily, Tylenol as needed.   - Added K-pad for right shoulder pain. 4. Mood/Behavior/Sleep: Wellbutrin 300 mg daily, Lexapro 20 mg daily, melatonin as needed             -antipsychotic agents: N/A   - 7/2: Discussed with the patient, agreeable to increase  antidepressant medications.  Already on max dose Lexapro, will transition from Wellbutrin 300 mg XR to Wellbutrin 200 mg SR twice daily.   - 7/8: No appreciable effect; denying SI/HI  5. Neuropsych/cognition: This patient is capable of making decisions on her own behalf.  7-5: Patient agreeable to trial of CPAP, RT consulted - ?applied  - 7/8: After discussion of risks vs. Benefits, will start small dose of Ritalin 5 mg BID in AM. Also SLP consult for cognition evaluation. RT has not brought CPAP; per sister had been many years since sleep study/titration, would need re-assessed as OP if needed  but may do overnight pulse ox study here.   6. Skin/Wound Care:/Bilateral diabetic foot ulcers.  Skin care as directed routine skin checks  -Mepilex to foot ulcers, Prevalon boots at nighttime  -Protein supplementation, daily Zinc 220 mg and Vitamin C 500 mg supplementation   7. Fluids/Electrolytes/Nutrition: Routine in and outs with follow-up chemistries  Filed Weights   09/14/22 1814 09/21/22 1049  Weight: 100 kg 103 kg    8.  Diabetes mellitus with peripheral neuropathy.  Latest hemoglobin A1c 7.0.  NovoLog Mix 70/30 20 units twice daily             -Prior use of metformin, to restart if needed  -Monitor today, blood glucose mildly elevated overnight.  6/29- will increase 70/30 to 22 units BID since running 140s to 212- since Cr elevated, don't feel comfortable starting Metformin.   6/30- much better since increased 70/30- down to 110s-120s from 200's; STABLE 7/8 Recent Labs    09/25/22 1641 09/25/22 2122 09/26/22 0621  GLUCAP 155* 185* 150*      9.  Hypertension.  Coreg 3.125 mg twice daily, Norvasc 5 mg daily (half home dose).  Monitor with increased mobility.  Long-term BP goal normotensive.  Avoid hypotension/dehydration.  - 6/27: Hypertensive 170s overnight, neurology recommending higher BP goals due to orthostasis.  Will order as needed hydralazine 10 mg every 8 hours for SBP greater  than 180, DBP greater than 110.  Teds and abdominal binder ordered.   - 6/28: 150s to 170s today, BS adequate, increase Norvasc to 10 mg   - 7/2: Resume Losartan at 25 mg daily -will wait 1 to 2 days to see effect; had creatinine this a.m. 1.6 mildly elevated from last take but within baseline as below  - 7/5: BMP not obtained this a.m., will hold off on losartan increase due to normotension and check on Monday, mildly increase sys BP monitor prior to dose change  - 7/8- 130s-150s; contriolled; monitor    09/26/2022    4:37 AM 09/25/2022    6:50 PM 09/25/2022    2:04 PM  Vitals with BMI  Systolic 138 136 161  Diastolic 56 59 57  Pulse 58 80 64    10.  Obesity.  BMI 33.52.  Dietary follow-up 11.  CKD IIIa, creatinine baseline 1.57-1.84.  Follow-up chemistries  -Creatinine 1.5 this a.m., at baseline, monitor - 1.4; stable   12.  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  Follow-up outpatient Dr Pamelia Hoit.  Continue Nolvadex. 13.  Hyperlipidemia.  Lipitor 80 mg daily 14.  Right shoulder pain             -Improved, continue Voltaren gel    15. Constipation.  No recorded bowel movement since admission.  Ordered senna 1 tab nightly, MiraLAX daily  - Small BM 6/28; monitor  First decent BM 6/28 per pt.   - LBM 7/7  16.  Urinary urgency/retention/UTI Start Ditropan 2.5 mg twice daily.   6/29- Pt's PVRs up to 200s- 369 and another value of 325cc- Has not been cathed so far- has been able to void.   6/30- will check U/A and Cx- since malodorous and still retaining- last checked 6/22 and had (+) Nitrites and small leuks- likely has become UTI? Pending at this time.   7/1 - UA appears UTI, Cx pending, culture from 6-22 showing pansensitive E. Coli; initially determine asymptomatic bacteria, however patient was slowed cognition, increased confusion, and  increased urinary urgency from last hospitalization.  Will start Keflex 500 mg BID for  7 days; allergic to Bactrim and Cipro. Still 200-300 on bladder scans.   7-3: Bladder scans lower overnight, but a few incontinent episodes.  Discussed with patient this a.m.  Urinary cultures Keflex sensitive.   - 7/4: Ongoing urge and stress incontinence, with borderline retention. Concerned for cholinergic s/e with ongoing confusion, lethargy. DC ditropan, switch to myrbetriq in AM.   7-5: Required ISC 1 time overnight.  Switching from Ditropan to Myrbetriq today; so far PVRs remain low.  If further ISC use this weekend, would discontinue Myrbetriq  7/8: DC myrbetriq, start Flomax 0.4 mg at bedtime, timed toiletting for high retention  17. OSA. Per patient recently sold her CPAP machine.  Discussed RT consult for inpatient use, patient refuses, states she was unable to tolerate it and is not willing to try again.  Will follow-up.  -7-3: Still refusing CPAP, despite significant daytime lethargy.  7-5: Patient agreeable to trial of CPAP; RT consulted   LOS: 12 days A FACE TO FACE EVALUATION WAS PERFORMED  [name] 09/26/2022, 8:06 AM

## 2022-09-26 NOTE — Progress Notes (Signed)
Physical Therapy Session Note  Patient Details  Name: [name] MRN: [id number] Date of Birth: [birth year]  Today's Date: 09/26/2022 PT Individual Time: 1st Treatment Session: 0800-0900; 2nd Treatment Session: 2130-8657 PT Individual Time Calculation (min): 60 min; 45 min  Short Term Goals: Week 2:  PT Short Term Goal 1 (Week 2): STGs=LTGs secondary to ELOS  Skilled Therapeutic Interventions/Progress Updates: 1st Treatment Session-  Patient greeted supine in bed with sister, [name], present and agreeable to PT treatment session. While supine, [name] assisted with donning ted hose and socks (which she did at [facility]). Patient transitioned to sitting EOB with supv where she donned clean shirt and pants with set-up assistance. Therapist donned tennis shoes for time management (which [name] also did at [facility]). Patient stood from raised bed (which simulated bed height at home) with RW and supv. Patient ambulated to/from the bathroom with RW and SBA for safety with VC for improved R foot clearance and step length. Patient was able to manage pants/brief with SBA for safety- Patient sat for an extended period of time, however was unsuccessful with void or BM. Patient ambulated back to sitting EOB, however simulated height of bed at home (which is very tall) and patient was unable to scoot her hips posteriorly on the bed for safe sitting balance. Therapist discussed with [name] regarding removing box-spring in order to lower the height of the bed for improved safety. Patient gait trained ~75' with RW and hands-on assistance from [place] for safety- VC throughout for improved Rt foot clearance and step length for improved gait mechanics and stability. Patient left sitting upright in wheelchair in room with physician present performing morning rounds- All questions answered throughout treatment session with education regarding improved safety at home.    2nd Treatment Session- Patient greeted supine in bed with  sister, [name], present and agreeable to PT treatment session. Patient transitioned from supine to sitting EOB with use of bed rail and Supv. Therapist suggested [name] purchase a bed rail for use at home in order to ensure independence and ease with bed mobility. Patient stood from EOB with RW and SBA for safety and then performed stand pivot transfer to wheelchair with SBA- VC for stepping all the way back to the wheelchair and improved eccentric control. Patient wheeled from her room to main rehab gym for time management and energy conservation.   Patient ascended/descended x5 step with S HR on the right and CGA from [facility]- Therapist with CGA as well in order to ensure safety. VC for using a step-to pattern while ascending and descending in order to improve overall stability. Patient led with Rt LE throughout stair mobility.   Patient then gait trained back to her room ~130' with RW and CGA from [facility] for improved Rt foot clearance and step length with emphasis on heel-toe pattern.   Therapist and [name] discussed expected level of function at discharge, DME needs, adjusting height of bed, handicap sticker, HHC vs OP therapy, etc in order to ensure a safe discharge home. Therapist also spent time discussing with patient about improving her overall mood/affect and finding things she enjoys doing that will motivate her throughout the day.   Patient left sitting upright in wheelchair with sister present, call bell within reach and all needs met.    Therapy Documentation Precautions:  Precautions Precautions: Fall Required Braces or Orthoses: Other Brace Other Brace: Left DARCO boot for chronic wound Restrictions Weight Bearing Restrictions: No  Pain: No/Denies pain- Only reports significant lethargy.  Therapy/Group: Individual Therapy  Daryan Buell 09/26/2022, 7:52 AM

## 2022-09-26 NOTE — Progress Notes (Signed)
Occupational Therapy Session Note  Patient Details  Name: [name] MRN: [id number] Date of Birth: 08/18/1950   Today's Date: 09/26/2022 Session 1: OT Individual Time: 4098-1191 OT Individual Time Calculation (min): 57 min Session 2: OT Individual Time: 1402-1430 OT Individual Time Calculation (min): 28 min    Short Term Goals: Week 2:  OT Short Term Goal 1 (Week 2): LTG=STG 2/2 ELOS   Session 1: Skilled Therapeutic Interventions/Progress Updates:    Pt received sitting in wheelchair.  No c/o pain today.  Pt sister [name] present for family education.  Family education with focus on CLOF, tub/shower transfer, toileting and level of assistance needed at d/c.  Education provided to sister about CLOF at a close supervision level.  Discussion about pts change from baseline related to cognition and R sided weakness.  OT/OTS made recommendation for 24-supervision and.  Provided education on strategies for safety when toileting at night.  Discussed toileting schedule and continues use of briefs during the night time hours.  Sister agreed that she would be able to provide help for toileting during the night and stated they had a baby monitor that they have used in the past.  Education on LB dressing and bathing and use of long handled sponge for LB bathing.  Pt and her sister assisted to the tub room to practice tub/shower transfer with extended shower bench.  Pt able to complete transfer with Min A and use of RW.  Pt had difficulty bringing LE over the threshhold of the bathtub.  Extended time required for completion of transfer.  Pt able to transfer from shower to standing with CGA and Min verbal cues for safety and placement of buttocks on shower bench.  Functional mobility back to room ~50 ft with use of RW.  Pt back in bed, bed alarm activate, call light within reach and all needs met.       Session 2: Skilled Therapeutic Interventions/Progress Updates:  Pt received sitting in wheelchair.   No c/o pain.  Sister still present. Sister requested that OT provide print out of bed rail that PT recommended.  Print out left in patients room for sisters visit tomorrow 09/27/2022.  Functional mobility ~127ft from room to gym wit RW and CGA for safety and Mod verbal cues for shuffle stepping with R foot.  Cues for taking bigger steps provided.  Dynamic standing balance activity while standing at trampoline completed with CGA and RW for safety and mod verbal cues for force when throwing ball at trampoline. Activity completed 2 sets of 10.  Patient required extended rest breaks for fatigue.  Pt back in room in wheelchair, chair alarm activated, call light within reach, and all needs mets.  Therapy Documentation Precautions:  Precautions Precautions: Fall Required Braces or Orthoses: Other Brace Other Brace: Left DARCO boot for chronic wound Restrictions Weight Bearing Restrictions: No     Therapy/Group: Individual Therapy  Liam Graham 09/26/2022, 3:32 PM

## 2022-09-27 LAB — COMPREHENSIVE METABOLIC PANEL
ALT: 26 U/L (ref 0–44)
AST: 22 U/L (ref 15–41)
Albumin: 3.3 g/dL — ABNORMAL LOW (ref 3.5–5.0)
Alkaline Phosphatase: 47 U/L (ref 38–126)
Anion gap: 10 (ref 5–15)
BUN: 42 mg/dL — ABNORMAL HIGH (ref 8–23)
CO2: 25 mmol/L (ref 22–32)
Calcium: 9.1 mg/dL (ref 8.9–10.3)
Chloride: 99 mmol/L (ref 98–111)
Creatinine, Ser: 1.71 mg/dL — ABNORMAL HIGH (ref 0.44–1.00)
GFR, Estimated: 32 mL/min — ABNORMAL LOW (ref >60)
Glucose, Bld: 184 mg/dL — ABNORMAL HIGH (ref 70–99)
Potassium: 4 mmol/L (ref 3.5–5.1)
Sodium: 134 mmol/L — ABNORMAL LOW (ref 135–145)
Total Bilirubin: 0.7 mg/dL (ref 0.3–1.2)
Total Protein: 7.8 g/dL (ref 6.5–8.1)

## 2022-09-27 LAB — URINALYSIS, ROUTINE W REFLEX MICROSCOPIC
Bacteria, UA: NONE SEEN
Bilirubin Urine: NEGATIVE
Glucose, UA: 50 mg/dL — AB
Hgb urine dipstick: NEGATIVE
Ketones, ur: NEGATIVE mg/dL
Leukocytes,Ua: NEGATIVE
Nitrite: NEGATIVE
Protein, ur: 30 mg/dL — AB
Specific Gravity, Urine: 1.014 (ref 1.005–1.030)
pH: 5 (ref 5.0–8.0)

## 2022-09-27 LAB — CBC WITH DIFFERENTIAL/PLATELET
Abs Immature Granulocytes: 0.05 10*3/uL (ref 0.00–0.07)
Basophils Absolute: 0 10*3/uL (ref 0.0–0.1)
Basophils Relative: 1 %
Eosinophils Absolute: 0.1 10*3/uL (ref 0.0–0.5)
Eosinophils Relative: 1 %
HCT: 32.8 % — ABNORMAL LOW (ref 36.0–46.0)
Hemoglobin: 11.2 g/dL — ABNORMAL LOW (ref 12.0–15.0)
Immature Granulocytes: 1 %
Lymphocytes Relative: 29 %
Lymphs Abs: 1.6 10*3/uL (ref 0.7–4.0)
MCH: 33.1 pg (ref 26.0–34.0)
MCHC: 34.1 g/dL (ref 30.0–36.0)
MCV: 97 fL (ref 80.0–100.0)
Monocytes Absolute: 0.6 10*3/uL (ref 0.1–1.0)
Monocytes Relative: 11 %
Neutro Abs: 3.2 10*3/uL (ref 1.7–7.7)
Neutrophils Relative %: 57 %
Platelets: 210 10*3/uL (ref 150–400)
RBC: 3.38 MIL/uL — ABNORMAL LOW (ref 3.87–5.11)
RDW: 14.3 % (ref 11.5–15.5)
WBC: 5.5 10*3/uL (ref 4.0–10.5)
nRBC: 0 % (ref 0.0–0.2)

## 2022-09-27 LAB — GLUCOSE, CAPILLARY
Glucose-Capillary: 161 mg/dL — ABNORMAL HIGH (ref 70–99)
Glucose-Capillary: 258 mg/dL — ABNORMAL HIGH (ref 70–99)
Glucose-Capillary: 153 mg/dL — ABNORMAL HIGH (ref 70–99)
Glucose-Capillary: 214 mg/dL — ABNORMAL HIGH (ref 70–99)

## 2022-09-27 MED ORDER — BUPROPION HCL ER (SR) 150 MG PO TB12: 300.0000 mg | ORAL_TABLET | Freq: Every day | ORAL | Status: DC

## 2022-09-27 MED ORDER — INSULIN ASPART PROT & ASPART (70-30 MIX) 100 UNIT/ML ~~LOC~~ SUSP
25.0000 [IU] | Freq: Two times a day (BID) | SUBCUTANEOUS | Status: DC
  Administered 2022-09-27 – 2022-09-30 (×6): 25 [IU] via SUBCUTANEOUS
  Filled 2022-09-27: qty 10

## 2022-09-27 MED ORDER — TAMSULOSIN HCL 0.4 MG PO CAPS
0.8000 mg | ORAL_CAPSULE | Freq: Every day | ORAL | Status: DC
  Administered 2022-09-27 – 2022-09-29 (×3): 0.8 mg via ORAL
  Filled 2022-09-27 (×3): qty 2

## 2022-09-27 MED ORDER — BUPROPION HCL ER (SR) 100 MG PO TB12
100.0000 mg | ORAL_TABLET | Freq: Two times a day (BID) | ORAL | Status: DC
  Filled 2022-09-27: qty 1

## 2022-09-27 MED ORDER — ACETAMINOPHEN 325 MG PO TABS: 650.0000 mg | ORAL_TABLET | ORAL | Status: AC | PRN

## 2022-09-27 MED ORDER — BUPROPION HCL ER (XL) 300 MG PO TB24
300.0000 mg | ORAL_TABLET | Freq: Every day | ORAL | Status: DC
  Administered 2022-09-28: 300 mg via ORAL
  Filled 2022-09-27: qty 1

## 2022-09-27 MED ORDER — SORBITOL 70 % SOLN
15.0000 mL | Freq: Once | Status: AC
  Administered 2022-09-27: 15 mL via ORAL
  Filled 2022-09-27: qty 30

## 2022-09-27 NOTE — Patient Care Conference (Signed)
Inpatient RehabilitationTeam Conference and Plan of Care Update Date: 09/27/2022   Time: 10:36 AM    Patient Name: [name]      Medical Record Number: [id number]  Date of Birth: [birth year] Sex: Female         Room/Bed: 4M13C/4M13C-01 Payor Info: Payor: Arna [employer]i ADVANTAGE / Plan: [employer] PPO / Product Type: *No Product type* /    Admit Date/Time:  09/14/2022  6:04 PM  Primary Diagnosis:  Infarction of left basal ganglia [facility])  Hospital Problems: Principal Problem:   Infarction of left basal ganglia [facility])    Expected Discharge Date: Expected Discharge Date: 09/30/22  Team Members Present: Physician leading conference: Dr. Elijah Birk Social Worker Present: Cecile Sheerer, LCSWA Nurse Present: Vedia Pereyra, RN PT Present: Amedeo Plenty, PT OT Present: Kearney Hard, OT SLP Present: Feliberto Gottron, SLP PPS Coordinator present : Fae Pippin, SLP     Current Status/Progress Goal Weekly Team Focus  Bowel/Bladder   incontintent to bladder with urinary retention, required occiasional intermittent cath, PVR's 170s-400s, continent to bowel LBM 7/4   Pt to regain continenence   toilet every 4 hours and PVRs 4 times a day, double void, new medication education flomax    Swallow/Nutrition/ Hydration               ADL's   Supervision, UB ADLs, close supervision with intemittent CGA for balance within LB BADLs   Supervision   pt/family education, dc planning, ADLs, activity tolerance, self-care retraining    Mobility   Supv for bed mobility with bed rail [name] is to purchase one for home); SBA for sit/stands, transfers and gait up to 120'- Continues to be limited by endurance/activity tolerance, global strength and poor attention/carryover.   Supv with LRAD  Barriers: Limited carryover/attention, however will have her sister present with all functional mobility- Family training completed today with good hands-on and VC from sister. Continue to focus  on independence with household mobility- Sit/stands, transfers, gait, stair mobilty.    Communication                Safety/Cognition/ Behavioral Observations               Pain   pt denies pain   <3/10   Assess every shift and prn, Kpad as needed    Skin   Left heel blister ruptured, stage 2 foam dressesing, right heel intact pink blanches, coccyx stage 2 healing foam dressing.   Wounds to continue to show signs of healing, no S/S of infection  assess every shift, dressing change per orders      Discharge Planning:  D/c plan remains to her sister [name]'s home who will provide 24/7care . Sister needs her to be as independent as possible since she is not able provide physical care, and also caring for her husband. Fam edu completed on Monday (7/8) 8am-12pm. SW will confirm there are no barriers to discharge.   Team Discussion: Left basal ganglia infarct. Time toileting. Intermittent I/O cathing. Not voiding at all now.  Daily dressing changes to diabetic wounds. Wound care plan in place. Loop recorder site with steri strips. Requires constant cueing with all task with poor carryover. Will need bed rails to improve independence. Sister to get equipment needed.  Sister and patient both insistent that will not do I/O cathing at home. Will encourage CPAP use.  Patient on target to meet rehab goals: yes, progressing towards goals with discharge date moved to 09/30/22 to allow time to  see if will void and empty on her own.  *See Care Plan and progress notes for long and short-term goals.   Revisions to Treatment Plan:  Ritalin added. U/A w C&S. Flomax added. CPAP. Monitor labs/VS Teaching Needs: Medications, safety, self care, wound care, gait/transfer training, etc.   Current Barriers to Discharge: Decreased caregiver support, Neurogenic bowel and bladder, Wound care, and noncompliance with CPAP at home.   Possible Resolutions to Barriers: Family education completed Regain  control of bladder Independent with wound care Compliant with CPAP use     Medical Summary Current Status: medically complicated by hyperglycemia with DM2, CKD, hypertension, bradycardia, wounds, depression, fatigue/lethargy, memory deficits, peripheral edem,a and urinary retention  Barriers to Discharge: Incontinence;Medical stability;Morbid Obesity;Self-care education;Neurogenic Bowel & Bladder;Renal Insufficiency/Failure;Uncontrolled Hypertension;Uncontrolled Diabetes;Complicated Wound;Behavior/Mood  Barriers to Discharge Comments: urinary retention, cognitve deficits, wounds Possible Resolutions to [employer] Focus: timed toiletting/PVRs/double voiding, adjusting medications to encourage urination, adjusting medications to stimulate cogniton, increasing DM regimen   Continued Need for Acute Rehabilitation Level of Care: The patient requires daily medical management by a physician with specialized training in physical medicine and rehabilitation for the following reasons: Direction of a multidisciplinary physical rehabilitation program to maximize functional independence : Yes Medical management of patient stability for increased activity during participation in an intensive rehabilitation regime.: Yes Analysis of laboratory values and/or radiology reports with any subsequent need for medication adjustment and/or medical intervention. : Yes   I attest that I was present, lead the team conference, and concur with the assessment and plan of the team.   Jearld Adjutant 09/27/2022, 2:58 PM

## 2022-09-27 NOTE — Progress Notes (Signed)
Physical Therapy Session Note  Patient Details  Name: [name] MRN: [id number] Date of Birth: February 17, 1951  Today's Date: 09/27/2022 PT Individual Time: 0809-0920 PT Individual Time Calculation (min): 71 min   Short Term Goals: Week 1:  PT Short Term Goal 1 (Week 1): Patient will complete bed mobility with MinA PT Short Term Goal 1 - Progress (Week 1): Met PT Short Term Goal 2 (Week 1): Patient will complete bed/chair transfer with LRAD and CGA PT Short Term Goal 2 - Progress (Week 1): Met PT Short Term Goal 3 (Week 1): Patient will ambulate >50' with LRAD and CGA PT Short Term Goal 3 - Progress (Week 1): Met Week 2:  PT Short Term Goal 1 (Week 2): STGs=LTGs secondary to ELOS  Skilled Therapeutic Interventions/Progress Updates:  Patient supine in bed asleep on entrance to room. Patient alert and agreeable to PT session. Pt relates another night of reduced sleep d/t several bouts of UI with need for I/O cath x2 overnight. She is very lethargic and sleepy throughout session. Extra time required to complete all tasks.   Patient with no pain complaint at start of session.  Therapeutic Activity: Bed Mobility: Pt is unable to manage bed linens off and wrestles against blankets to bring feet out of bed. Requires ModA for linens and extra time with supervision for reaching seated position EOB. Dressing performed while seated EOB and is able to doff gown, don t-shirt provided with setup and then threading pants with MinA with pt able to pull up once in stance. Few instances of post lean with potential for LOB requiring MinA to maintain balance over forefoot while donning pants. Max/ TotA to don compression socks, diabetic socks and Darco shoe to L foot, tennis shoe to R foot.  Transfers/ NMR: Pt performed sit<>stand from slightly elevated bed surface with supervision/ CGA d/t weakness from lack of sleep. Pt relates no need to toilet as "nothing will happen anyway". Stand pivot transfers performed  throughout session with supervision. Is able to maintain standing at sink to perform brushing teeth and then brushing hair. Pt fatigues during stance with slow flexion to knees and requires time in sitting to complete ADLs at sink with washing of face/ hands. Completes all with Mod I/ setup for ADL.   Neuromuscular Re-ed: NMR facilitated during session with focus on standing balance, endurance, and strengthening. Pt guided in toss of bean bags to target placed 12 feet away from patient. She is able to maintain balance throughout choosing beanbag with LUE, passing to RUE, throwing beanbag with underhand toss to target. Pt makes one target in hole with 8 on board and 5 on floor. Pt then instructed to use reacher for ambulatory transfer to pick up 6 beanbags on the floor. She is able to maintain balance and place each bag into basket at different heights from below hip height to head height and at limit of BOS. Pt states that she used to have a reacher and can see the utility. Is going to order a new one.   NMR performed for improvements in motor control and coordination, balance, sequencing, judgement, and self confidence/ efficacy in performing all aspects of mobility at highest level of independence.   Gait Training/ NMR:  Pt ambulated household distances this session with focus on functional mobility and balance. Pt is able to ambulate up to 50 foot distances with slow but consistent pace and low but adequate step height. Provided vc/ tc for upright posture and level gaze.  Patient seated upright in w/c at end of session with brakes locked, belt alarm set, and all needs within reach. Oriented to time and time of next therapy session with OT in 15 minutes.   Therapy Documentation Precautions:  Precautions Precautions: Fall Required Braces or Orthoses: Other Brace Other Brace: Left DARCO boot for chronic wound Restrictions Weight Bearing Restrictions: No General:   Vital Signs:  Pain:  No  pain related this session.   Therapy/Group: Individual Therapy  Loel Dubonnet PT, DPT, CSRS 09/27/2022, 7:48 AM

## 2022-09-27 NOTE — Progress Notes (Signed)
Occupational Therapy Session Note  Patient Details  Name: [name] MRN: [id number] Date of Birth: 1950-08-25  Today's Date: 09/27/2022 OT Individual Time: 4098-1191 OT Individual Time Calculation (min): 57 min    Short Term Goals: Week 2:  OT Short Term Goal 1 (Week 2): LTG=STG 2/2 ELOS  Skilled Therapeutic Interventions/Progress Updates:    Pt greeted sitting in wc and agreeable to OT treatment session. Pt had already gotten dressed and declined to shower today. Pt stated she wanted to shower tomorrow. Nursing entered to administer meds.  Pt agreeable try to urinate. She ambulated into bathroom w/ RW and close supervision. CGA when turning to sit on commode. Pt able to manage clothing with supervision. Pt sat for extended time, but unable to void. Pt ambulated to therapy gym with RW and close supervision w/ intermittent CGA. OT made pt home exercise program with seated exercises and standing balance exercises. Standing exercises to be done with sister holding onto gait belt. Pt completed standing balance exercises standing in front of sturdy table. Standing Heel Raise with Support, Standing Toe Raises, Mini Squats with Counter, Standing Tandem Balance, and Standing Single Leg Stance-all with support. Pt only able to hold single leg stance for 5 seconds. Pt took seated rest breaks in between sets.  Pt brought back to room in wc. Stand-pivot back to bed with supervision and increased time to lift LLE back in bed. Pt left semi-reclined in bed with bed alarm on, call bell in reach, and needs met.   Therapy Documentation Precautions:  Precautions Precautions: Fall Required Braces or Orthoses: Other Brace Other Brace: Left DARCO boot for chronic wound Restrictions Weight Bearing Restrictions: No Pain:  Denies pain   Therapy/Group: Individual Therapy  [name] 09/27/2022, 11:30 AM

## 2022-09-27 NOTE — Progress Notes (Addendum)
[name], female   DOB: December 04, 1950, 72 y.o.   MRN: [id number]  SW met with pt at bedside to provide updates from team conference, and d/c date now changed from 7/11 to 7/12 to see if there is any resolve with current bladder issues. Pt is hopeful she will not require catheters at discharge. Informed on [facility] services needed. States to discuss with her sister.   1309-SW spoke with [name] to provide above updates. No HHA preferred. Reports she was able to get a RW and w/c. SW will follow-up with  updates.   SW waiting on updates from Ashley[facility] about HHPT/OT/aide/?SN.  *declined referral.  SW sent referral to Amy/[facility] and waiting on follow-up.  *referral accepted for HHPT/OT (will address self-care needs during session), and SW will confirm if SN is needed.   SW called [name] to inform on above.   Cecile Sheerer, MSW, LCSWA Office: [phone] Cell: [phone] Fax: [phone]

## 2022-09-27 NOTE — Progress Notes (Signed)
Physical Therapy Session Note  Patient Details  Name: [name] MRN: [id number] Date of Birth: [birth year]  Today's Date: 09/27/2022 PT Individual Time: 1345-1500 PT Individual Time Calculation (min): 75 min   Short Term Goals: Week 2:  PT Short Term Goal 1 (Week 2): STGs=LTGs secondary to ELOS  Skilled Therapeutic Interventions/Progress Updates:  Patient greeted semi-reclined in bed in her room and agreeable to PT treatment session- Patient transitioned to sitting EOB with supv and without the use of the bed rail. While sitting EOB, therapist donned Lt DARCO boot and Rt shoe for time management and energy conservation. Patient wheeled to/from her room and main rehab gym for time management and energy conservation.   Patient performed various sit/stands with RW and SBA for safety with good recall of proper hand placement.   Patient gait trained 2 x 95' with RW and SBA/Supv for safety- Minor VC for improved Rt foot clearance/step length, however significant improvements in attention and awareness throughout gait, as well as cadence with Ritalin added.   Patient completed standing therex in // bars with BUE support in order to increase BLE strength and ROM for improved functional mobility and overall independence-  -Alternating marches with 1.5# weights, x20 total  -Mini-squats, x10 total with multimodal cues for improved mechanics -Side-stepping across // bars with 1.5# weights on each ankle, x 2 trials in each direction.   Patient returned to her room and ambulated to the bathroom with RW and SBA- VC for improved Rt foot clearance and step length with good improvements noted. Patient was able to manage pants/brief while standing and left sitting on the toilet with call bell in hand and agreeable to pulling the cord prior to standing. NT notified of patient positioning and agreeable to assist.   Patient with notable improvements in overall affect/mood and initiation with Ritalin added to  medications. Patient was eager to go to therapy, increased cadence with all activities and able to cheer on other patients within the rehab gym- Physician notified and aware.    Therapy Documentation Precautions:  Precautions Precautions: Fall Required Braces or Orthoses: Other Brace Other Brace: Left DARCO boot for chronic wound Restrictions Weight Bearing Restrictions: No  Pain: No/Denies pain.    Therapy/Group: Individual Therapy  Phoenicia Pirie 09/27/2022, 7:49 AM

## 2022-09-27 NOTE — Progress Notes (Signed)
PROGRESS NOTE   Subjective/Complaints:  Was able to get bladder scans down with double voiding overnight. Still required ISC, and nursing reports as of this a.m. she does not feel the urge to void and has not been able to void independently without ISC.  She denies any other symptoms other than some discoloration in her urine.  No numbness tingling in the groin, no incontinence of bowel but does note last bowel movement 3 days prior.   Vitals stable No CBC yesterday; will get CBC, repeat CMP today  ROS: Denies fevers, chills, N/V, abdominal pain, constipation, diarrhea, SOB, cough, chest pain, new weakness or paraesthesias.   + Lethargy-improved + Memory deficits-improved + Urinary retention-worsening + Constipation  Objective:   No results found. No results for input(s): "WBC", "HGB", "HCT", "PLT" in the last 72 hours.  Recent Labs    09/26/22 0730  NA 136  K 3.9  CL 103  CO2 26  GLUCOSE 201*  BUN 35*  CREATININE 1.40*  CALCIUM 8.7*      Intake/Output Summary (Last 24 hours) at 09/27/2022 0927 Last data filed at 09/27/2022 1610 [place] per 24 hour  Intake 1901 ml  Output 1616 ml  Net 285 ml         Physical Exam: Vital Signs Blood pressure (!) 144/51, pulse 64, temperature 98.1 F (36.7 C), temperature source Oral, resp. rate 18, height 5\' 9"  (1.753 m), weight 103 kg, SpO2 98 %.   General: No acute distress.  Laying in bed, awake, alert.  Mood and affect are depressed, flat-mildly improved Heart: Regular rate and rhythm no rubs murmurs or extra sounds Lungs: Clear to auscultation, breathing unlabored, no rales or wheezes Abdomen: Positive bowel sounds, soft nontender to palpation, nondistended.  Neurologic: Cranial nerves II through XII intact, antigravity and against resistance 4 out of 5 in all extremities, with exception of 3+ out of 5 in bilateral hip flexors, consistent with prior exams. Sensory exam  absent LT ensation below knees bilaterally; no sensory loss in the groin or buttocks Musculoskeletal: Full range of motion in all 4 extremities.   Extremities: 2+ nonpitting edema in bilateral lower extremities.  -Unchanged Skin:  + heel wounds-not examined today, covered in TED hose + Stage II to coccyx on admission                  Assessment/Plan: 1. Functional deficits which require 3+ hours per day of interdisciplinary therapy in a comprehensive inpatient rehab setting. Physiatrist is providing close team supervision and 24 hour management of active medical problems listed below. Physiatrist and rehab team continue to assess barriers to discharge/monitor patient progress toward functional and medical goals  Care Tool:  Bathing    Body parts bathed by patient: Right arm, Left arm, Chest, Abdomen, Front perineal area, Right upper leg, Left upper leg, Face   Body parts bathed by helper: Buttocks, Left lower leg, Right lower leg     Bathing assist Assist Level: Moderate Assistance - Patient 50 - 74%     Upper Body Dressing/Undressing Upper body dressing   What is the patient wearing?: Pull over shirt    Upper body assist Assist Level: Minimal Assistance -  Patient > 75%    Lower Body Dressing/Undressing Lower body dressing      What is the patient wearing?: Pants, Underwear/pull up     Lower body assist Assist for lower body dressing: Maximal Assistance - Patient 25 - 49%     Toileting Toileting    Toileting assist Assist for toileting: Moderate Assistance - Patient 50 - 74%     Transfers Chair/bed transfer  Transfers assist     Chair/bed transfer assist level: Minimal Assistance - Patient > 75%     Locomotion Ambulation   Ambulation assist      Assist level: Minimal Assistance - Patient > 75% Assistive device: Walker-rolling Max distance: 41'   Walk 10 feet activity   Assist     Assist level: Minimal Assistance - Patient >  75% Assistive device: Walker-rolling   Walk 50 feet activity   Assist Walk 50 feet with 2 turns activity did not occur: Safety/medical concerns (Patient unable to ambulate >15' at this time secondary to poor endurance/activity tolerance)  Assist level: Minimal Assistance - Patient > 75% Assistive device: Walker-rolling    Walk 150 feet activity   Assist Walk 150 feet activity did not occur: Safety/medical concerns         Walk 10 feet on uneven surface  activity   Assist Walk 10 feet on uneven surfaces activity did not occur: Safety/medical concerns         Wheelchair     Assist Is the patient using a wheelchair?: Yes Type of Wheelchair: Manual    Wheelchair assist level: Dependent - Patient 0% Max wheelchair distance: Therapist pushed wheelchair due to poor endurance and shoulder pain    Wheelchair 50 feet with 2 turns activity    Assist        Assist Level: Dependent - Patient 0%   Wheelchair 150 feet activity     Assist      Assist Level: Dependent - Patient 0%   Blood pressure (!) 144/51, pulse 64, temperature 98.1 F (36.7 C), temperature source Oral, resp. rate 18, height 5\' 9"  (1.753 m), weight 103 kg, SpO2 98 %.  1. Functional deficits secondary to left basal ganglia and left parietal small/punctate infarcts as well as history of right MCA infarction.   Current weakness from prior CVA  -patient may shower             -ELOS/Goals: 7 to 10 days, supervision with PT and OT goals - 7/11 DC date -> 7/12 to work on voiding d/t patient and sister not willing to [facility]    - 7/9: Cognition and carryover limiting to therapy progres; will get SLP eval on discharge day. CGA for balance and poor awareness, slow. Close SPV and sister available for all ADLs. SPV bed mobility; will need bed rail. Standby assist for STS. Discussed with family at training yesterday she will need hands on assistance for all things. Will get H/H. Will need handicapped  placcard.   2.  Antithrombotics: -DVT/anticoagulation:  Pharmaceutical: Lovenox             -antiplatelet therapy: Aspirin 81 mg daily and Plavix 75 mg daily x 3 weeks then Plavix alone 3. Pain Management: Voltaren gel 4 g 4 times daily, Tylenol as needed.   - Added K-pad for right shoulder pain. 4. Mood/Behavior/Sleep: Wellbutrin 300 mg daily, Lexapro 20 mg daily, melatonin as needed             -antipsychotic agents: N/A   - 7/2:  Discussed with the patient, agreeable to increase antidepressant medications.  Already on max dose Lexapro, will transition from Wellbutrin 300 mg XR to Wellbutrin 200 mg SR twice daily.   - 7/8: No appreciable effect; denying SI/HI  - 7/9: Decrease back to 300 mg daily XR given increased urinary retention, see below  5. Neuropsych/cognition: This patient is capable of making decisions on her own behalf.  - 7-5: Patient agreeable to trial of CPAP, RT consulted   - 7/8: After discussion of risks vs. Benefits, will start small dose of Ritalin 5 mg BID in AM. Also SLP consult for cognition evaluation. RT has not brought CPAP; per sister had been many years since sleep study/titration, would need re-assessed as OP if needed but may do overnight pulse ox study here.   - 7/9: Carryover, cognition, alertness seem much better with Ritalin; continue, along with decrease Wellbutrin as above.  Reconsulting RT today  6. Skin/Wound Care:/Bilateral diabetic foot ulcers.  Skin care as directed routine skin checks  -Mepilex to foot ulcers, Prevalon boots at nighttime  -Protein supplementation, daily Zinc 220 mg and Vitamin C 500 mg supplementation   7. Fluids/Electrolytes/Nutrition: Routine in and outs with follow-up chemistries  Filed Weights   09/14/22 1814 09/21/22 1049  Weight: 100 kg 103 kg    8.  Diabetes mellitus with peripheral neuropathy.  Latest hemoglobin A1c 7.0.  NovoLog Mix 70/30 20 units twice daily             -Prior use of metformin, to restart if  needed  -Monitor today, blood glucose mildly elevated overnight.  6/29- will increase 70/30 to 22 units BID since running 140s to 212- since Cr elevated, don't feel comfortable starting Metformin.   6/30- much better since increased 70/30- down to 110s-120s from 200's; STABLE 7/8  7/9: uptrending, increase 70/30 to 25U BID  Recent Labs    09/26/22 1700 09/26/22 2105 09/27/22 0618  GLUCAP 194* 262* 161*      9.  Hypertension.  Coreg 3.125 mg twice daily, Norvasc 5 mg daily (half home dose).  Monitor with increased mobility.  Long-term BP goal normotensive.  Avoid hypotension/dehydration.  - 6/27: Hypertensive 170s overnight, neurology recommending higher BP goals due to orthostasis.  Will order as needed hydralazine 10 mg every 8 hours for SBP greater than 180, DBP greater than 110.  Teds and abdominal binder ordered.   - 6/28: 150s to 170s today, BS adequate, increase Norvasc to 10 mg   - 7/2: Resume Losartan at 25 mg daily -will wait 1 to 2 days to see effect; had creatinine this a.m. 1.6 mildly elevated from last take but within baseline as below  - 7/5: BMP not obtained this a.m., will hold off on losartan increase due to normotension and check on Monday, mildly increase sys BP monitor prior to dose change  - 7/8- 130s-150s; contriolled; monitor    09/27/2022    4:57 AM 09/26/2022    7:33 PM 09/26/2022    2:54 PM  Vitals with BMI  Systolic 144 135 161  Diastolic 51 52 56  Pulse 64 70 67    10.  Obesity.  BMI 33.52.  Dietary follow-up 11.  CKD IIIa, creatinine baseline 1.57-1.84.  Follow-up chemistries  -Creatinine 1.5 this a.m., at baseline, monitor - 1.4; stable   12.  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  Follow-up outpatient Dr Pamelia Hoit.  Continue Nolvadex. 13.  Hyperlipidemia.  Lipitor 80 mg daily 14.  Right shoulder pain             -Improved, continue Voltaren gel    15. Constipation.  No recorded bowel  movement since admission.  Ordered senna 1 tab nightly, MiraLAX daily  - Small BM 6/28; monitor  - First decent BM 6/28 per pt.   - LBM 7/7 -scheduled sorbitol this is p.m.  16.  Urinary urgency/retention/UTI Start Ditropan 2.5 mg twice daily.   6/29- Pt's PVRs up to 200s- 369 and another value of 325cc- Has not been cathed so far- has been able to void.   6/30- will check U/A and Cx- since malodorous and still retaining- last checked 6/22 and had (+) Nitrites and small leuks- likely has become UTI? Pending at this time.   7/1 - UA appears UTI, Cx pending, culture from 6-22 showing pansensitive E. Coli; initially determine asymptomatic bacteria, however patient was slowed cognition, increased confusion, and increased urinary urgency from last hospitalization.  Will start Keflex 500 mg BID for 7 days; allergic to Bactrim and Cipro. Still 200-300 on bladder scans.   7-3: Bladder scans lower overnight, but a few incontinent episodes.  Discussed with patient this a.m.  Urinary cultures Keflex sensitive.   - 7/4: Ongoing urge and stress incontinence, with borderline retention. Concerned for cholinergic s/e with ongoing confusion, lethargy. DC ditropan, switch to myrbetriq in AM.   7-5: Required ISC 1 time overnight.  Switching from Ditropan to Myrbetriq today; so far PVRs remain low.  If further ISC use this weekend, would discontinue Myrbetriq  7/8: DC myrbetriq, start Flomax 0.4 mg at bedtime, timed toiletting for high retention  7/9: Worsening retention, now requiring ISC for.  Will get UA to ensure p.o. antibiotic treatment with adequate; if signs of ongoing infection will start IV ceftriaxone and get repeat cultures.  Increase Flomax to 0.8 mg.  Reduce Wellbutrin back to 300 mg XR daily to reduce serotonergic side effects.  Sorbitol for bowel movement today.  No neurologic signs or symptoms to indicate recurrent stroke or cauda equina.  17. OSA. Per patient recently sold her CPAP machine.  Discussed  RT consult for inpatient use, patient refuses, states she was unable to tolerate it and is not willing to try again.  Will follow-up.  -7-3: Still refusing CPAP, despite significant daytime lethargy.  7-5: Patient agreeable to trial of CPAP; RT consulted; reconsulted 7/9   LOS: 13 days A FACE TO FACE EVALUATION WAS PERFORMED  [name] 09/27/2022, 9:27 AM

## 2022-09-28 ENCOUNTER — Inpatient Hospital Stay (HOSPITAL_COMMUNITY): Payer: PPO

## 2022-09-28 LAB — GLUCOSE, CAPILLARY
Glucose-Capillary: 140 mg/dL — ABNORMAL HIGH (ref 70–99)
Glucose-Capillary: 197 mg/dL — ABNORMAL HIGH (ref 70–99)
Glucose-Capillary: 182 mg/dL — ABNORMAL HIGH (ref 70–99)
Glucose-Capillary: 212 mg/dL — ABNORMAL HIGH (ref 70–99)

## 2022-09-28 LAB — CREATININE, SERUM
Creatinine, Ser: 1.41 mg/dL — ABNORMAL HIGH (ref 0.44–1.00)
GFR, Estimated: 40 mL/min — ABNORMAL LOW (ref >60)

## 2022-09-28 LAB — SODIUM, URINE, RANDOM: Sodium, Ur: 37 mmol/L

## 2022-09-28 LAB — OSMOLALITY, URINE: Osmolality, Ur: 379 mOsm/kg (ref 300–900)

## 2022-09-28 MED ORDER — SORBITOL 70 % SOLN
960.0000 mL | TOPICAL_OIL | Freq: Once | ORAL | Status: AC
  Administered 2022-09-28: 960 mL via RECTAL
  Filled 2022-09-28: qty 240

## 2022-09-28 MED ORDER — SENNOSIDES-DOCUSATE SODIUM 8.6-50 MG PO TABS
1.0000 | ORAL_TABLET | Freq: Two times a day (BID) | ORAL | Status: DC
  Administered 2022-09-28 – 2022-09-30 (×5): 1 via ORAL
  Filled 2022-09-28 (×5): qty 1

## 2022-09-28 MED ORDER — POLYETHYLENE GLYCOL 3350 17 G PO PACK
17.0000 g | PACK | Freq: Two times a day (BID) | ORAL | Status: DC
  Administered 2022-09-29 – 2022-09-30 (×2): 17 g via ORAL
  Filled 2022-09-28 (×3): qty 1

## 2022-09-28 MED ORDER — BETHANECHOL CHLORIDE 10 MG PO TABS
10.0000 mg | ORAL_TABLET | Freq: Three times a day (TID) | ORAL | Status: DC
  Administered 2022-09-28 – 2022-09-30 (×5): 10 mg via ORAL
  Filled 2022-09-28 (×5): qty 1

## 2022-09-28 MED ORDER — GLYCERIN (LAXATIVE) 2 G RE SUPP: 1.0000 | Freq: Every day | RECTAL | Status: DC | PRN

## 2022-09-28 MED ORDER — METOCLOPRAMIDE HCL 5 MG PO TABS
10.0000 mg | ORAL_TABLET | Freq: Three times a day (TID) | ORAL | Status: DC
  Administered 2022-09-28 – 2022-09-30 (×5): 10 mg via ORAL
  Filled 2022-09-28 (×5): qty 2

## 2022-09-28 MED ORDER — SORBITOL 70 % SOLN: 30.0000 mL | Freq: Every day | Status: DC | PRN

## 2022-09-28 NOTE — Progress Notes (Signed)
KUB shows fecal material throughout the colon consistent with constipation.  Placed orders for smog enema which she will receive first as well as placed orders for sorbitol as needed and will begin Reglan 10 mg 3 times daily.

## 2022-09-28 NOTE — Progress Notes (Signed)
Occupational Therapy Session Note  Patient Details  Name: [name] MRN: [id number] Date of Birth: 08/21/1950  Today's Date: 09/28/2022 OT Individual Time: 4098-1191 OT Individual Time Calculation (min): 45 min    Short Term Goals: Week 2:  OT Short Term Goal 1 (Week 2): LTG=STG 2/2 ELOS    Skilled Therapeutic Interventions/Progress Updates:    Pt received lying supine in bed.  Lab in room taking blood work.  Pt had no c/o pain this am. OTS offered shower this am but pt stated "I'm not in the mindset to be cold, I was cold all night".  Pt wanted to eat her breakfast prior to completing ADLs.  HOB elevated and pt eating meal with (S). D/t time constraints pt offered for PT to help with shower in the afternoon and pt stated she did not feel comfortable with a female helping with showers. Pt agreeable to wash up at the sink.  Increased time required throughout session for completion of ADLs tasks.  Cuing and therapeutic use of self implemented throughout session for efficiency for tasks.  Sit<>stand from EOB done with (S) and RW.  UB bathing and dressing completed with (S) while seated in the wheelchair at sink.  LB bathing and dressing completed with Min A d/t time constraint. Pt requested to remain seated in wheelchair until next therapy session.  Pt left in wheelchair with chair alarm activated, call light within reach and all needs met.    Therapy Documentation Precautions:  Precautions Precautions: Fall Required Braces or Orthoses: Other Brace Other Brace: Left DARCO boot for chronic wound Restrictions Weight Bearing Restrictions: No      Therapy/Group: Individual Therapy  Liam Graham 09/28/2022, 7:33 AM

## 2022-09-28 NOTE — Progress Notes (Signed)
PROGRESS NOTE   Subjective/Complaints:  ISC x 3 overnight. Notable patient significantly increased fluid intakes 7/8-9; uncertain why. Prior to this was negative >1 L per day.  UA negative yesterday, CBC stable, CMP significant for Na 134 and AKI 1.7; down to 1.4 this AM.    No acute complaints. Some lethargy this AM. On Ros, patient endorses she's been dry of mouth/thirsty over past few days, and also having hot flashes especially at nighttime. No nausea, vomitting; did have BM yesterday which she felt was large. Endorses she had urge to pee this AM but was unable to void.   ROS: Denies fevers, chills, N/V, abdominal pain, constipation, diarrhea, SOB, cough, chest pain, new weakness or paraesthesias.   + Lethargy-improved + Memory deficits-improved + Urinary retention- started over weekend - unchanged + Constipation - improved  Objective:   No results found. Recent Labs    09/27/22 1157  WBC 5.5  HGB 11.2*  HCT 32.8*  PLT 210    Recent Labs    09/26/22 0730 09/27/22 1533 09/28/22 0735  NA 136 134*  --   K 3.9 4.0  --   CL 103 99  --   CO2 26 25  --   GLUCOSE 201* 184*  --   BUN 35* 42*  --   CREATININE 1.40* 1.71* 1.41*  CALCIUM 8.7* 9.1  --       Intake/Output Summary (Last 24 hours) at 09/28/2022 0838 Last data filed at 09/28/2022 0430 [place] per 24 hour  Intake 1320 ml  Output 1750 ml  Net -430 ml         Physical Exam: Vital Signs Blood pressure (!) 143/56, pulse 64, temperature 97.6 F (36.4 C), temperature source Oral, resp. rate 16, height 5\' 9"  (1.753 m), weight 103 kg, SpO2 98 %.   General: No acute distress. Sitting up in bedside chair.  Mood and affect are depressed, flat-mildly - mildly improved since starting ritalin Heart: Regular rate and rhythm no rubs murmurs or extra sounds Lungs: Clear to auscultation, breathing unlabored, no rales or wheezes Abdomen: Positive bowel sounds,  soft nontender to palpation, nondistended.  Neurologic:  AAOx4  + Memory deficit - mild, improved since starting ritalin + Cognitive slowing - MUCH improved since starting ritalin Cranial nerves II through XII intact, antigravity and against resistance 4/5 in all extremities, with exception of 3+ out of 5 in bilateral hip flexors, consistent with prior exams. Sensory exam absent LT ensation below knees bilaterally  Musculoskeletal: Full range of motion in all 4 extremities.   Extremities: 2+ nonpitting edema in bilateral lower extremities.  -Unchanged Skin:  + heel wounds  + Stage II to coccyx on admission                  Assessment/Plan: 1. Functional deficits which require 3+ hours per day of interdisciplinary therapy in a comprehensive inpatient rehab setting. Physiatrist is providing close team supervision and 24 hour management of active medical problems listed below. Physiatrist and rehab team continue to assess barriers to discharge/monitor patient progress toward functional and medical goals  Care Tool:  Bathing    Body parts bathed by patient: Right arm, Left  arm, Chest, Abdomen, Front perineal area, Right upper leg, Left upper leg, Face   Body parts bathed by helper: Buttocks, Left lower leg, Right lower leg     Bathing assist Assist Level: Moderate Assistance - Patient 50 - 74%     Upper Body Dressing/Undressing Upper body dressing   What is the patient wearing?: Pull over shirt    Upper body assist Assist Level: Minimal Assistance - Patient > 75%    Lower Body Dressing/Undressing Lower body dressing      What is the patient wearing?: Pants, Underwear/pull up     Lower body assist Assist for lower body dressing: Maximal Assistance - Patient 25 - 49%     Toileting Toileting    Toileting assist Assist for toileting: Moderate Assistance - Patient 50 - 74%     Transfers Chair/bed transfer  Transfers assist     Chair/bed transfer  assist level: Minimal Assistance - Patient > 75%     Locomotion Ambulation   Ambulation assist      Assist level: Minimal Assistance - Patient > 75% Assistive device: Walker-rolling Max distance: 67'   Walk 10 feet activity   Assist     Assist level: Minimal Assistance - Patient > 75% Assistive device: Walker-rolling   Walk 50 feet activity   Assist Walk 50 feet with 2 turns activity did not occur: Safety/medical concerns (Patient unable to ambulate >15' at this time secondary to poor endurance/activity tolerance)  Assist level: Minimal Assistance - Patient > 75% Assistive device: Walker-rolling    Walk 150 feet activity   Assist Walk 150 feet activity did not occur: Safety/medical concerns         Walk 10 feet on uneven surface  activity   Assist Walk 10 feet on uneven surfaces activity did not occur: Safety/medical concerns         Wheelchair     Assist Is the patient using a wheelchair?: Yes Type of Wheelchair: Manual    Wheelchair assist level: Dependent - Patient 0% Max wheelchair distance: Therapist pushed wheelchair due to poor endurance and shoulder pain    Wheelchair 50 feet with 2 turns activity    Assist        Assist Level: Dependent - Patient 0%   Wheelchair 150 feet activity     Assist      Assist Level: Dependent - Patient 0%   Blood pressure (!) 143/56, pulse 64, temperature 97.6 F (36.4 C), temperature source Oral, resp. rate 16, height 5\' 9"  (1.753 m), weight 103 kg, SpO2 98 %.  1. Functional deficits secondary to left basal ganglia and left parietal small/punctate infarcts as well as history of right MCA infarction.   Current weakness from prior CVA  -patient may shower             -ELOS/Goals: 7 to 10 days, supervision with PT and OT goals - 7/11 DC date -> 7/12 to work on voiding d/t patient and sister not willing to [facility]    - 7/9: Cognition and carryover limiting to therapy progress; will get SLP eval  on discharge day. CGA for balance and poor awareness, slow. Close SPV and sister available for all ADLs. SPV bed mobility; will need bed rail. Standby assist for STS. Discussed with family at training yesterday she will need hands on assistance for all things. Will get H/H. Will need handicapped placcard.  2.  Antithrombotics: -DVT/anticoagulation:  Pharmaceutical: Lovenox             -  antiplatelet therapy: Aspirin 81 mg daily and Plavix 75 mg daily x 3 weeks then Plavix alone 3. Pain Management: Voltaren gel 4 g 4 times daily, Tylenol as needed.   - Added K-pad for right shoulder pain. 4. Mood/Behavior/Sleep: Wellbutrin 300 mg daily, Lexapro 20 mg daily, melatonin as needed             -antipsychotic agents: N/A   - 7/2: Discussed with the patient, agreeable to increase antidepressant medications.  Already on max dose Lexapro, will transition from Wellbutrin 300 mg XR to Wellbutrin 200 mg SR twice daily.   - 7/8: No appreciable effect; denying SI/HI  - 7/9: Decrease back to 300 mg daily XR given increased urinary retention, see below  - 7/10: Still retaining, but with some improved sensation to void this AM after decreasing Wellbutrin yesterday (T1/2 approx. 21 hours); also endorsing thirst, hot flashes - ?serotonin syndrome, will DC Wellbutrin and continue Lexapro only for now  5. Neuropsych/cognition: This patient is capable of making decisions on her own behalf.  - 7-5: Patient agreeable to trial of CPAP, RT consulted   - 7/8: After discussion of risks vs. Benefits, will start small dose of Ritalin 5 mg BID in AM. Also SLP consult for cognition evaluation. RT has not brought CPAP; per sister had been many years since sleep study/titration, would need re-assessed as OP if needed but may do overnight pulse ox study here.   - 7/9: Carryover, cognition, alertness seem much better with Ritalin; continue, along with decrease Wellbutrin as above.    - 7/10: Cognition remains improved, frequent cathing  interrupting sleep, worsening fatigue  6. Skin/Wound Care:/Bilateral diabetic foot ulcers.  Skin care as directed routine skin checks  -Mepilex to foot ulcers, Prevalon boots at nighttime  -Protein supplementation, daily Zinc 220 mg and Vitamin C 500 mg supplementation   7. Fluids/Electrolytes/Nutrition: Routine in and outs with follow-up chemistries   - Hyponatremia 7/9; see below   8.  Diabetes mellitus with peripheral neuropathy.  Latest hemoglobin A1c 7.0.  NovoLog Mix 70/30 20 units twice daily             -Prior use of metformin, to restart if needed  -Monitor today, blood glucose mildly elevated overnight.  6/29- will increase 70/30 to 22 units BID since running 140s to 212- since Cr elevated, don't feel comfortable starting Metformin.   6/30- much better since increased 70/30- down to 110s-120s from 200's; STABLE 7/8  7/9: uptrending, increase 70/30 to 25U BID - improved Recent Labs    09/27/22 1615 09/27/22 2059 09/28/22 0638  GLUCAP 153* 214* 140*      9.  Hypertension.  Coreg 3.125 mg twice daily, Norvasc 5 mg daily (half home dose).  Monitor with increased mobility.  Long-term BP goal normotensive.  Avoid hypotension/dehydration.  - 6/27: Hypertensive 170s overnight, neurology recommending higher BP goals due to orthostasis.  Will order as needed hydralazine 10 mg every 8 hours for SBP greater than 180, DBP greater than 110.  Teds and abdominal binder ordered.   - 6/28: 150s to 170s today, BS adequate, increase Norvasc to 10 mg   - 7/2: Resume Losartan at 25 mg daily -will wait 1 to 2 days to see effect; had creatinine this a.m. 1.6 mildly elevated from last take but within baseline as below  - 7/5: BMP not obtained this a.m., will hold off on losartan increase due to normotension and check on Monday, mildly increase sys BP monitor prior to dose change  -  7/8- 130s-150s; contriolled; monitor  - 7/10: BP stable, however with new mild hyponatremia w/ Losartan; may need to  adjust pending results in AM    09/28/2022    4:34 AM 09/27/2022    7:36 PM 09/27/2022   12:44 PM  Vitals with BMI  Systolic 143 136 324  Diastolic 56 48 69  Pulse 64 66 69    10.  Obesity.  BMI 33.52.  Dietary follow-up 11.  CKD IIIa, creatinine baseline 1.57-1.84.  Follow-up chemistries  -Creatinine 1.5 this a.m., at baseline, monitor - 1.4; stable   12.  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  Follow-up outpatient Dr Pamelia Hoit.  Continue Nolvadex. 13.  Hyperlipidemia.  Lipitor 80 mg daily 14.  Right shoulder pain             -Improved, continue Voltaren gel    15. Constipation.  No recorded bowel movement since admission.  Ordered senna 1 tab nightly, MiraLAX daily  - Small BM 6/28; monitor  - First decent BM 6/28 per pt.   - LBM 7/7 -scheduled sorbitol this is p.m. - 7/9: Sorbitol for bowel movement today.  No neurologic signs or symptoms to indicate recurrent stroke or cauda equina.  - BM 7/9 with sorbitol; KUB today with chronic constipation, increase bowel regimen to Miralax BIS and Sennakot BID. PRN suppository added. SMOG enema ordered with results  16.  Urinary urgency/retention/UTI - Neurogenic bladder? Start Ditropan 2.5 mg twice daily.   6/29- Pt's PVRs up to 200s- 369 and another value of 325cc- Has not been cathed so far- has been able to void.   6/30- will check U/A and Cx- since malodorous and still retaining- last checked 6/22 and had (+) Nitrites and small leuks- likely has become UTI? Pending at this time.   7/1 - UA appears UTI, Cx pending, culture from 6-22 showing pansensitive E. Coli; initially determine asymptomatic bacteria, however patient was slowed cognition, increased confusion, and increased urinary urgency from last hospitalization.  Will start Keflex 500 mg BID for 7 days; allergic to Bactrim and Cipro. Still 200-300 on bladder scans.   7-3: Bladder scans lower overnight, but a few  incontinent episodes.  Discussed with patient this a.m.  Urinary cultures Keflex sensitive.   - 7/4: Ongoing urge and stress incontinence, with borderline retention. Concerned for cholinergic s/e with ongoing confusion, lethargy. DC ditropan, switch to myrbetriq in AM.   7-5: Required ISC 1 time overnight.  Switching from Ditropan to Myrbetriq today; so far PVRs remain low.  If further ISC use this weekend, would discontinue Myrbetriq  7/8: DC myrbetriq, start Flomax 0.4 mg at bedtime, timed toiletting for high retention  7/9: Worsening retention, now requiring ISC for all voids.  Will get UA to ensure p.o. antibiotic treatment with adequate; if signs of ongoing infection will start IV ceftriaxone and get repeat cultures.  Increase Flomax to 0.8 mg.   7/10: UTI treated/cleared. CBC wnl. Cognitive improvements/stable. Ongoing severe retention; no obstruction on KUB and renal US, constipation Tx as above, today will start oxybutynin.    17. OSA. Per patient recently sold her CPAP machine.  Discussed RT consult for inpatient use, patient refuses, states she was unable to tolerate it and is not willing to try again.  Will follow-up.  -7-3: Still refusing CPAP, despite significant daytime lethargy.  - 7-5: Patient agreeable to trial of CPAP; RT consulted   18. Hyponatremia 134 7/9. Start fluid  restriction, urine studies. Likely related to resuming losartan. Also large fluid intakes over past 2 days.  - 7/10: Urine Na, Osm appear SIADH. Repeat BMP in AM, may need to DC losartan or reduce dose if downtrending.   LOS:   14 days A FACE TO FACE EVALUATION WAS PERFORMED  [name] 09/28/2022, 8:38 AM

## 2022-09-28 NOTE — Progress Notes (Signed)
Physical Therapy Discharge Summary  Patient Details  Name: [name] MRN: [id number] Date of Birth: 1950/04/20  Date of Discharge from PT service:{Time; dates multiple:[id number]}  Patient has met {NUMBERS 0-12:18577} of 10 long term goals due to improved activity tolerance, improved balance, increased strength, ability to compensate for deficits, functional use of  right lower extremity, improved attention, improved awareness, and improved coordination.  Patient to discharge at an ambulatory level Supervision.   Patient's care partner is independent to provide the necessary physical and cognitive assistance at discharge.  Reasons goals not met: ***  Recommendation:  Patient will benefit from ongoing skilled PT services in home health setting to continue to advance safe functional mobility, address ongoing impairments in dynamic stability, global strengthening, cognitive deficits, R NMR and minimize fall risk.  Equipment: Sister, [name], to purchase RW.  Reasons for discharge: treatment goals met and discharge from hospital  Patient/family agrees with progress made and goals achieved: Yes  PT Discharge Precautions/Restrictions Precautions Precautions: Fall Precaution Comments: urinary incontinence Required Braces or Orthoses: Other Brace Other Brace: Left DARCO boot for chronic wound Restrictions Weight Bearing Restrictions: No Pain *** Pain Interference Pain Interference Pain Effect on Sleep: 1. Rarely or not at all Pain Interference with Therapy Activities: 1. Rarely or not at all Pain Interference with Day-to-Day Activities: 1. Rarely or not at all Vision/Perception  Vision - History Ability to See in Adequate Light: 0 Adequate Perception Perception: Within Functional Limits Praxis Praxis: Intact  Cognition Overall Cognitive Status: Impaired/Different from baseline Arousal/Alertness: Awake/alert Orientation Level: Oriented X4 Year: 2024 Month: July Day of  Week: Correct Sustained Attention: Appears intact Selective Attention: Appears intact Memory: Impaired Memory Impairment: Storage deficit;Decreased short term memory Awareness: Appears intact Problem Solving: Appears intact Executive Function: Reasoning Reasoning: Impaired Reasoning Impairment: Functional complex Safety/Judgment: Appears intact Comments: Patient with history of depression, flat affect and fatigue Sensation Sensation Light Touch: Impaired by Hawker assessment Peripheral sensation comments: Peripheral neuropathy in B feet Light Touch Impaired Details: Impaired LLE;Impaired RLE Additional Comments: B LE neuropathy to B knees, but equally diminished on R and L Coordination [place] Motor Movements are Fluid and Coordinated: No Fine Motor Movements are Fluid and Coordinated: Yes Coordination and Movement Description: Global deconditioning leading to poor motor planning and coordination, however improved since initial evaluation Motor  Motor Motor: Abnormal postural alignment and control;Hemiplegia Motor - Skilled Clinical Observations: Mild right hemi LE>UE, however improved since initial evaluation Motor - Discharge Observations: Mild right hemi LE>UE with forward flexed posturing, however improved since initial evaluation  Mobility Bed Mobility Bed Mobility: Rolling Right;Rolling Left;Supine to Sit;Sit to Supine Rolling Right: Independent with assistive device Rolling Left: Independent with assistive device Supine to Sit: Independent with assistive device Sit to Supine: Independent with assistive device Transfers Transfers: Sit to Stand;Stand to Sit;Stand Pivot Transfers Sit to Stand: Supervision/Verbal cueing Stand to Sit: Supervision/Verbal cueing Stand Pivot Transfers: Supervision/Verbal cueing Stand Pivot Transfer Details: Verbal cues for safe use of DME/AE;Verbal cues for sequencing;Verbal cues for technique Transfer (Assistive device): Rolling  walker Locomotion  Gait Ambulation: Yes Gait Assistance: Supervision/Verbal cueing Gait Distance (Feet): 150 Feet Assistive device: Rolling walker Gait Assistance Details: Verbal cues for sequencing;Verbal cues for precautions/safety;Verbal cues for safe use of DME/AE;Verbal cues for technique;Verbal cues for gait pattern Gait Gait: Yes Gait Pattern: Impaired Gait Pattern: Decreased step length - right;Decreased step length - left;Shuffle;Trunk flexed;Decreased stride length Gait velocity: Decreased Stairs / Additional Locomotion Stairs: Yes Stairs Assistance: Supervision/Verbal cueing Stair Management Technique: One rail  Right Number of Stairs: 4 Height of Stairs: 6 Ramp: Supervision/Verbal cueing Curb: Supervision/Verbal TEFL teacher Mobility: Yes Wheelchair Assistance: Independent with Scientist, research (life sciences): Both upper extremities Wheelchair Parts Management: Needs assistance Distance: 150'  Trunk/Postural Assessment  Cervical Assessment Cervical Assessment: Exceptions to [facility] (Forward flexed head) Thoracic Assessment Thoracic Assessment: Exceptions to [facility] (Rounded shoulders) Lumbar Assessment Lumbar Assessment: Exceptions to [facility] (Posterior pelvic tilt) Postural Control Postural Control: Deficits on evaluation Righting Reactions: Delayed and inadequate with posterior bias, however improved since initial evaluation  Balance Balance Balance Assessed: Yes Static Sitting Balance Static Sitting - Balance Support: Feet unsupported;Bilateral upper extremity supported Static Sitting - Level of Assistance: 6: Modified independent (Device/Increase time) Dynamic Sitting Balance Dynamic Sitting - Balance Support: During functional activity;Feet unsupported Dynamic Sitting - Level of Assistance: 5: Stand by assistance Static Standing Balance Static Standing - Balance Support: Bilateral upper extremity supported Static Standing - Level of  Assistance: 5: Stand by assistance Dynamic Standing Balance Dynamic Standing - Balance Support: Bilateral upper extremity supported;During functional activity Dynamic Standing - Level of Assistance: 5: Stand by assistance Extremity Assessment      RLE Assessment RLE Assessment: Exceptions to [facility] General Strength Comments: Grossly 3/5 with decreased hip strength since last admission, however improved since initial evaluation LLE Assessment LLE Assessment: Exceptions to [facility] General Strength Comments: Lorge 3/5 except hip fl 2+/5 which pt reports is consistent with PLOF > last 5 years- Improving since eval   Sydney  Rafoth 09/28/2022, 1:20 PM

## 2022-09-28 NOTE — Progress Notes (Signed)
Physical Therapy Session Note  Patient Details  Name: [name] MRN: [id number] Date of Birth: 09/04/1950  Today's Date: 09/28/2022 PT Individual Time: 1002-1058 PT Individual Time Calculation (min): 56 min   Short Term Goals: Week 2:  PT Short Term Goal 1 (Week 2): STGs=LTGs secondary to ELOS  Skilled Therapeutic Interventions/Progress Updates:     Pt received seated in [facility] and agrees to therapy. No complaint of pain but reports having a "tough morning", due to having an SLP evaluation that she had not been aware of and being interrupted various times, as well as anxiety about bladder issues. PT provides active listening and encouragement for improvement of pt's mood. WC transport to gym. Pt performs sit to stand with cues for hand placement and sequencing. Pt ambulates x175' with RW and cues for upright gaze to improve posture and balance, increasing gait speed to decrease risk for falls, and safe AD management when transitioning to seated. Pt performs x3 reps sit to stand without RW with cues for body mechanics and eccentric control of stand to sit for improved safety and strengthening. PT lowers RW for improved body mechanics and pt ambulates additional x175' with same cues. Pt then performs x5 reps sit to stand without RW. Pt performs stand step transfer back to WC. WC transport back to room. Pt performs ambulatory transfer to toilet with CGA and cues for sequencing. Left seated on toilet with call bell in reach. RN aware.   Therapy Documentation Precautions:  Precautions Precautions: Fall Required Braces or Orthoses: Other Brace Other Brace: Left DARCO boot for chronic wound Restrictions Weight Bearing Restrictions: No   Therapy/Group: Individual Therapy  Beau Fanny, PT, DPT 09/28/2022, 5:13 PM

## 2022-09-28 NOTE — Plan of Care (Signed)
  Problem: RH Pre-functional/Other (Specify) Goal: RH LTG SLP (Specify) 1 Description: RH LTG SLP (Specify) 1 Flowsheets (Taken 09/28/2022 1037) LTG: Other SLP (Specify) 1: Patient will verbalize how to implement memory compensatory strategies at home to maximize functional recall at discharge with overall supervision level verbal cues.

## 2022-09-28 NOTE — Progress Notes (Signed)
Physical Therapy Session Note  Patient Details  Name: [name] MRN: [id number] Date of Birth: 1950/09/24  Today's Date: 09/28/2022 PT Individual Time: 1250-1340 PT Individual Time Calculation (min): 50 min   Short Term Goals: Week 2:  PT Short Term Goal 1 (Week 2): STGs=LTGs secondary to ELOS  Skilled Therapeutic Interventions/Progress Updates:  Patient greeted supine in bed with RN present administering medications and agreeable to PT treatment session. Patient transitioned to EOB independently with the use of a bed rail. While sitting EOB, therapist donned shoe and darco boot for time management and energy conservation. Patient stood from EOB with RW and Supv for safety- VC for proper hand placement and increased anterior weight shift throughout stand for improved stability. Patient gait trained x168' with RW and SBA/Supv for safety- VC throughout for improved Rt foot clearance/step length, stepping within the frame of the walker and improved postural extension with good improvements noted, however unable to sustain as she fatigues requiring increased VC. After a seated rest break, patient ascended/descended x5 steps with SHR and SBA for safety- Patient uses a step-to pattern and ascends/descends with RLE leading. Patient then performed x10 sit/stands from arm chair with SUE on arm rest and other UE on the walker- VC for increased anterior weight shift throughout stand and using a rocking motion x3 in order to gain momentum for standing with good improvements and carryover noted with increased repetition. Patient gait trained ~20' to her wheelchair with RW and Supv- same VC as above. Patient returned to her room and left sitting upright in wheelchair with call bell within reach, posey belt on and all needs met.    Therapy Documentation Precautions:  Precautions Precautions: Fall Required Braces or Orthoses: Other Brace Other Brace: Left DARCO boot for chronic wound Restrictions Weight  Bearing Restrictions: No  Pain: No/Denies pain.    Therapy/Group: Individual Therapy  Samir Ishaq 09/28/2022, 11:12 AM

## 2022-09-28 NOTE — Evaluation (Signed)
Speech Language Pathology Assessment and Plan  Patient Details  Name: [name] MRN: [id number] Date of Birth: 1950/05/01  SLP Diagnosis: Cognitive Impairments  Rehab Potential: Good ELOS: 09/30/22    Today's Date: 09/28/2022 SLP Individual Time: 4098-1191 SLP Individual Time Calculation (min): 55 min   Hospital Problem: Principal Problem:   Infarction of left basal ganglia (HCC)  Past Medical History:  Past Medical History:  Diagnosis Date   AKI (acute kidney injury) (HCC) 08/09/2022   Breast cancer (HCC) 12/2019   left breast DCIS   CKD (chronic kidney disease) 06/07/2016   Stage 2, GFR 60-89 ml/min   Diabetic polyneuropathy 06/07/2016   Dyslipidemia    Endometrial adenocarcinoma 2012   Fever blister    Hyperlipidemia    Hypertension, essential 06/07/2016   Major depressive disorder    Mixed dyslipidemia 06/07/2016   MRSA infection 10/2019   left foot wound   Obstructive sleep apnea 06/07/2016   does not use CPAP   Peripheral neuropathy    Retinopathy    Right rib fracture 08/09/2022   Severe obesity    Skin ulcer of left foot with fat layer exposed 11/02/2016   Stroke    Asymptomatic, discovered via neuroimaging; small infarct in left parietal lobe; also concern for small b/l frontal infarcts   Tachycardia 07/19/2017   Traumatic rhabdomyolysis (HCC) 08/09/2022   Type 2 diabetes mellitus with hyperglycemia, with long-term current use of insulin 06/07/2016   Unilateral primary osteoarthritis, right knee 04/16/2019   UTI (urinary tract infection) 08/09/2022   Past Surgical History:  Past Surgical History:  Procedure Laterality Date   ADRENALECTOMY     BREAST LUMPECTOMY WITH RADIOACTIVE SEED LOCALIZATION Left 02/25/2020   Procedure: LEFT BREAST LUMPECTOMY WITH RADIOACTIVE SEED LOCALIZATION;  Surgeon: Abigail Miyamoto, MD;  Location: [facility];  Service: General;  Laterality: Left;   BUNIONECTOMY     CARDIAC CATHETERIZATION     CERVICAL  SPINE SURGERY     CHOLECYSTECTOMY     DILATION AND CURETTAGE OF UTERUS     LAPAROSCOPIC SALPINGOOPHERECTOMY     LOOP RECORDER INSERTION N/A 09/14/2022   Procedure: LOOP RECORDER INSERTION;  Surgeon: Lanier Prude, MD;  Location: [facility];  Service: Cardiovascular;  Laterality: N/A;   THORACOTOMY     TONSILLECTOMY      Assessment / Plan / Recommendation Clinical Impression Patient is a 72 year old right-handed female with history of ductal carcinoma in situ of left breast status post lumpectomy with radioactive seed implant 2021, CKD, diabetes mellitus, hypertension, obesity, hyperlipidemia as well as history of right middle cerebral artery infarction maintained on aspirin and Plavix and received CIR 08/12/2022 - 08/25/2022 and discharged to home. Presented 09/10/2023 for with acute onset of right-sided weakness. MRI showed acute infarct in the bilateral corona radiata and in the left parietal lobe without hemorrhage.  Tolerating a regular consistency diet. Therapy evaluations with recommendation for a comprehensive rehab program. Patient admitted 09/14/22. PT/OT reported cognitive deficits, therefore, SLP consulted for cognitive-linguistic evaluation.  Patient reports that prior to her first admission in May, she lived a pretty sedentary lifestyle. She reports she slept or watched television the majority of the day and would only eat one meal a day. Patient also reported that leaving the house to go to appointments felt like too much of a hassle and would often cancel appointments because it was easier. Patient now lives with her sister post discharge from initial admission. She reports improved activity as her sister implemented a  daily routine but continues to report ongoing fatigue with decreased motivation to complete home exercise programs. She also reports her sister manages her medications and appointments.   SLP administered the COGNISTAT with patient scoring WFL on all subtests with  the exception of mild impairments in judgement. During an informal evaluation, patient demonstrated a flat affect, decreased recall of daily information, and impaired thought organization resulting in patient frequently losing her train of thought during informal conversation.  Patient agreeable to skilled SLP intervention for education regarding memory compensatory strategies and living a healthy brian lifestyle as well as implementation of education at discharge. Patient verbalized understanding and agreement.     Skilled Therapeutic Interventions          Administered a cognitive-linguistic evaluation, please see above for details   SLP Assessment  Patient will need skilled Speech Lanaguage Pathology Services during CIR admission    Recommendations  Oral Care Recommendations: Oral care BID Recommendations for Other Services: Neuropsych consult Patient destination: Home Follow up Recommendations: 24 hour supervision/assistance;Home Health SLP Equipment Recommended: None recommended by SLP    SLP Frequency 1 to 3 out of 7 days   SLP Duration  SLP Intensity  SLP Treatment/Interventions 09/30/22  Minumum of 1-2 x/day, 30 to 90 minutes  Cognitive remediation/compensation;Cueing hierarchy;Functional tasks;Environmental controls;Internal/external aids;Patient/family education;Therapeutic Activities    Pain No/Denies Pain   SLP Evaluation Cognition Overall Cognitive Status: Impaired/Different from baseline Arousal/Alertness: Awake/alert Orientation Level: Oriented X4 Sustained Attention: Appears intact Selective Attention: Appears intact Memory: Impaired Memory Impairment: Storage deficit;Decreased short term memory Awareness: Appears intact Problem Solving: Appears intact Executive Function: Reasoning Reasoning: Impaired Reasoning Impairment: Functional complex Safety/Judgment: Appears intact Comments: Patient with history of depression, flat affect and fatigue   Comprehension Auditory Comprehension Overall Auditory Comprehension: Appears within functional limits for tasks assessed Expression Expression Primary Mode of Expression: Verbal Verbal Expression Overall Verbal Expression: Appears within functional limits for tasks assessed Written Expression Dominant Hand: Right Written Expression: Not tested Oral Motor Oral Motor/Sensory Function Overall Oral Motor/Sensory Function: Within functional limits Motor Speech Overall Motor Speech: Appears within functional limits for tasks assessed  Care Tool Care Tool Cognition Ability to hear (with hearing aid or hearing appliances if normally used Ability to hear (with hearing aid or hearing appliances if normally used): 0. Adequate - no difficulty in normal conservation, social interaction, listening to TV   Expression of Ideas and Wants Expression of Ideas and Wants: 3. Some difficulty - exhibits some difficulty with expressing needs and ideas (e.g, some words or finishing thoughts) or speech is not clear   Understanding Verbal and Non-Verbal Content Understanding Verbal and Non-Verbal Content: 4. Understands (complex and basic) - clear comprehension without cues or repetitions  Memory/Recall Ability Memory/Recall Ability : Current season;Staff names and faces;That he or she is in a hospital/hospital unit   Short Term Goals: Week 1: SLP Short Term Goal 1 (Week 1): STGs=LTGs due to ELOS  Refer to Care Plan for Long Term Goals  Recommendations for other services: Neuropsych  Discharge Criteria: Patient will be discharged from SLP if patient refuses treatment 3 consecutive times without medical reason, if treatment goals not met, if there is a change in medical status, if patient makes no progress towards goals or if patient is discharged from hospital.  The above assessment, treatment plan, treatment alternatives and goals were discussed and mutually agreed upon: by patient  Flornce Record,  Donaldson Richter 09/28/2022, 10:41 AM

## 2022-09-29 ENCOUNTER — Other Ambulatory Visit (HOSPITAL_COMMUNITY): Payer: Self-pay

## 2022-09-29 LAB — BASIC METABOLIC PANEL
Anion gap: 7 (ref 5–15)
BUN: 38 mg/dL — ABNORMAL HIGH (ref 8–23)
CO2: 24 mmol/L (ref 22–32)
Calcium: 8.6 mg/dL — ABNORMAL LOW (ref 8.9–10.3)
Chloride: 105 mmol/L (ref 98–111)
Creatinine, Ser: 1.52 mg/dL — ABNORMAL HIGH (ref 0.44–1.00)
GFR, Estimated: 36 mL/min — ABNORMAL LOW (ref >60)
Glucose, Bld: 153 mg/dL — ABNORMAL HIGH (ref 70–99)
Potassium: 3.8 mmol/L (ref 3.5–5.1)
Sodium: 136 mmol/L (ref 135–145)

## 2022-09-29 LAB — GLUCOSE, CAPILLARY
Glucose-Capillary: 95 mg/dL (ref 70–99)
Glucose-Capillary: 138 mg/dL — ABNORMAL HIGH (ref 70–99)
Glucose-Capillary: 147 mg/dL — ABNORMAL HIGH (ref 70–99)
Glucose-Capillary: 179 mg/dL — ABNORMAL HIGH (ref 70–99)

## 2022-09-29 MED ORDER — INSULIN ASPART PROT & ASPART (70-30 MIX) 100 UNIT/ML ~~LOC~~ SUSP
25.0000 [IU] | Freq: Two times a day (BID) | SUBCUTANEOUS | 11 refills | Status: AC
  Filled 2022-09-29: qty 10, 20d supply, fill #0

## 2022-09-29 MED ORDER — CARVEDILOL 3.125 MG PO TABS
3.1250 mg | ORAL_TABLET | Freq: Two times a day (BID) | ORAL | 0 refills | Status: AC
  Filled 2022-09-29: qty 60, 30d supply, fill #0

## 2022-09-29 MED ORDER — ZINC SULFATE 220 (50 ZN) MG PO TABS
220.0000 mg | ORAL_TABLET | Freq: Every day | ORAL | 0 refills | Status: AC
  Filled 2022-09-29: qty 100, 100d supply, fill #0

## 2022-09-29 MED ORDER — CLOPIDOGREL BISULFATE 75 MG PO TABS
75.0000 mg | ORAL_TABLET | Freq: Every day | ORAL | 0 refills | Status: AC
  Filled 2022-09-29: qty 30, 30d supply, fill #0

## 2022-09-29 MED ORDER — METFORMIN HCL 500 MG PO TABS
500.0000 mg | ORAL_TABLET | Freq: Two times a day (BID) | ORAL | 0 refills | Status: DC
  Filled 2022-09-29: qty 60, 30d supply, fill #0

## 2022-09-29 MED ORDER — LOSARTAN POTASSIUM 25 MG PO TABS
25.0000 mg | ORAL_TABLET | Freq: Every day | ORAL | 0 refills | Status: AC
  Filled 2022-09-29: qty 30, 30d supply, fill #0

## 2022-09-29 MED ORDER — POLYETHYLENE GLYCOL 3350 17 G PO PACK: 17.0000 g | PACK | Freq: Two times a day (BID) | ORAL | Status: DC

## 2022-09-29 MED ORDER — ESCITALOPRAM OXALATE 20 MG PO TABS
20.0000 mg | ORAL_TABLET | Freq: Every day | ORAL | 0 refills | Status: AC
  Filled 2022-09-29: qty 30, 30d supply, fill #0

## 2022-09-29 MED ORDER — "ULTICARE INSULIN SYRINGE 30G X 1/2"" 0.5 ML MISC"
0 refills | Status: DC
  Filled 2022-09-29: qty 60, 30d supply, fill #0

## 2022-09-29 MED ORDER — METHYLPHENIDATE HCL 5 MG PO TABS
5.0000 mg | ORAL_TABLET | Freq: Two times a day (BID) | ORAL | 0 refills | Status: DC
  Filled 2022-09-29: qty 60, 30d supply, fill #0

## 2022-09-29 MED ORDER — TAMSULOSIN HCL 0.4 MG PO CAPS
0.8000 mg | ORAL_CAPSULE | Freq: Every day | ORAL | 0 refills | Status: DC
  Filled 2022-09-29: qty 60, 30d supply, fill #0

## 2022-09-29 MED ORDER — BETHANECHOL CHLORIDE 10 MG PO TABS
10.0000 mg | ORAL_TABLET | Freq: Three times a day (TID) | ORAL | 0 refills | Status: DC
  Filled 2022-09-29: qty 21, 7d supply, fill #0

## 2022-09-29 MED ORDER — ASPIRIN 81 MG PO TBEC: 81.0000 mg | DELAYED_RELEASE_TABLET | Freq: Every day | ORAL | Status: DC

## 2022-09-29 MED ORDER — SENNOSIDES-DOCUSATE SODIUM 8.6-50 MG PO TABS: 1.0000 | ORAL_TABLET | Freq: Two times a day (BID) | ORAL | Status: DC

## 2022-09-29 MED ORDER — DICLOFENAC SODIUM 1 % EX GEL
4.0000 g | Freq: Four times a day (QID) | CUTANEOUS | 0 refills | Status: AC
  Filled 2022-09-29: qty 400, 25d supply, fill #0

## 2022-09-29 MED ORDER — AMLODIPINE BESYLATE 10 MG PO TABS
10.0000 mg | ORAL_TABLET | Freq: Every day | ORAL | 0 refills | Status: DC
  Filled 2022-09-29: qty 30, 30d supply, fill #0

## 2022-09-29 MED ORDER — ATORVASTATIN CALCIUM 80 MG PO TABS
80.0000 mg | ORAL_TABLET | Freq: Every day | ORAL | 0 refills | Status: AC
  Filled 2022-09-29: qty 30, 30d supply, fill #0

## 2022-09-29 MED ORDER — CHOLECALCIFEROL 125 MCG (5000 UT) PO TABS
5000.0000 [IU] | ORAL_TABLET | Freq: Every evening | ORAL | 0 refills | Status: AC
  Filled 2022-09-29: qty 100, 100d supply, fill #0

## 2022-09-29 NOTE — Progress Notes (Signed)
Speech Language Pathology Discharge Summary  Patient Details  Name: [name] MRN: [id number] Date of Birth: 02-Oct-1950  Date of Discharge from SLP service:September 29, 2022  Today's Date: 09/29/2022 SLP Individual Time: 1330-1415 SLP Individual Time Calculation (min): 45 min   Skilled Therapeutic Interventions:  Skilled treatment session focused on cognitive goals. SLP facilitated session by providing education regarding memory compensatory strategies and how to implement strategies at home to maximize recall and carryover of daily, pertinent information. Patient verbalized understanding and also generated a verbal list of ideas/tasks that she can participate in at home to maximize cognitive engagement with overall supervision level verbal cues. Patient verbalized understanding of all information and handouts were given to reinforce education. Patient left upright in wheelchair with alarm on and all needs within reach. Continue with current plan of care.    Patient has met 1 of 1 long term goals.  Patient to discharge at overall Min;Mod level.   Reasons goals not met: N/A   Clinical Impression/Discharge Summary: Patient has met 1 of 1 LTGs this admission. Currently, patient continues to demonstrate overall mild-moderate memory impairments but patient educated regarding memory compensatory strategies and how to live a healthy brain lifestyle. Patient was engaged during conversation, verbalized understanding, and generated a verbal list of tasks/activities she can do at home to stay cognitively active. Patient education is complete and patient will discharge home with 24 hour supervision from family. Patient would benefit from f/u SLP services to maximize her cognitive functioning and overall functional independence in order to reduce caregiver burden.   Care Partner:  Caregiver Able to Provide Assistance: Yes  Type of Caregiver Assistance: Physical;Cognitive  Recommendation:  24 hour  supervision/assistance;Home Health SLP  Rationale for SLP Follow Up: Maximize cognitive function and independence;Reduce caregiver burden   Equipment: N/A   Reasons for discharge: Discharged from hospital;Treatment goals met   Patient/Family Agrees with Progress Made and Goals Achieved: Yes    Kiaria Quinnell 09/29/2022, 2:52 PM

## 2022-09-29 NOTE — Progress Notes (Addendum)
Occupational Therapy Session Note  Patient Details  Name: AZURI BOZARD MRN: [id number] Date of Birth: 12-17-1950  Today's Date: 09/29/2022 OT Individual Time: 1302-1330 OT Individual Time Calculation (min): 28 min   Short Term Goals: Week 2:  OT Short Term Goal 1 (Week 2): LTG=STG 2/2 ELOS  Skilled Therapeutic Interventions/Progress Updates:    Pt greeted seated in wc after lunch and agreeable to OT treatment session. Pt brought to therapy gym in wc or time management. 9-hole peg test completed- L:36, R:4. OT went through theraputty exercises using soft yellow putty. OT provided pt with 10 beads to then locate and pull out of putty with R hand.  Pt needed increased time with R hand. Supervision sit <>stands and short distance ambulation with Rollator.Pt handoff to SLP for next therapy session.   Therapy Documentation Precautions:  Precautions Precautions: Fall Precaution Comments: urinary incontinence Required Braces or Orthoses: Other Brace Other Brace: Left DARCO boot for chronic wound Restrictions Weight Bearing Restrictions: No Pain: Denies pain   Therapy/Group: Individual Therapy  [name] 09/29/2022, 1:38 PM

## 2022-09-29 NOTE — Progress Notes (Signed)
Occupational Therapy Discharge Summary  Patient Details  Name: [name] MRN: [id number] Date of Birth: 1951-02-07  Date of Discharge from OT service:September 29, 2022   Patient has met 10 of 10 long term goals due to improved activity tolerance, improved balance, postural control, ability to compensate for deficits, functional use of  RIGHT upper and RIGHT lower extremity, and improved attention.  Patient to discharge at overall Supervision level.  Patient's care partner is independent to provide the necessary physical and cognitive assistance at discharge for higher level iADL tasks.    Reasons goals not met: n/a  Recommendation:  Patient will benefit from ongoing skilled OT services in home health setting to continue to advance functional skills in the area of BADL.  Equipment: No equipment provided  Reasons for discharge: treatment goals met and discharge from hospital  Patient/family agrees with progress made and goals achieved: Yes  OT Discharge Precautions/Restrictions  Precautions Precautions: Fall Precaution Comments: urinary incontinence Required Braces or Orthoses: Other Brace Other Brace: Left DARCO boot for chronic wound Restrictions Weight Bearing Restrictions: No Pain  Denies pain ADL ADL Eating: Modified independent Where Assessed-Eating: Wheelchair Grooming: Independent Where Assessed-Grooming: Sitting at sink, Wheelchair Upper Body Bathing: Setup Where Assessed-Upper Body Bathing: Shower Lower Body Bathing: Supervision/safety Where Assessed-Lower Body Bathing: Shower Upper Body Dressing: Supervision/safety Where Assessed-Upper Body Dressing: Wheelchair Lower Body Dressing: Supervision/safety Where Assessed-Lower Body Dressing: Wheelchair Toileting: Supervision/safety Where Assessed-Toileting: Teacher, adult education: Close supervision Statistician Method: Proofreader: Raised toilet seat Tub/Shower Transfer: Close  supervison Web designer Method: Ship broker: Information systems manager with back Film/video editor: Moderate assistance Film/video editor Method: Manufacturing systems engineer with back ADL Comments: Pt performs tasks very slowly Geographical information systems officer: Within Functional Limits Praxis Praxis: Intact Cognition Cognition Overall Cognitive Status: Impaired/Different from baseline Arousal/Alertness: Awake/alert Orientation Level: Person;Place;Situation Person: Oriented Place: Oriented Situation: Oriented Memory: Impaired Memory Impairment: Storage deficit;Decreased short term memory Attention: Selective Sustained Attention: Appears intact Selective Attention: Appears intact Awareness: Appears intact Problem Solving: Appears intact Executive Function: Reasoning Reasoning: Impaired Reasoning Impairment: Functional complex Safety/Judgment: Appears intact Comments: Patient with history of depression, flat affect and fatigue Brief Interview for Mental Status (BIMS) Repetition of Three Words (First Attempt): 3 Temporal Orientation: Year: Correct Temporal Orientation: Month: Accurate within 5 days Temporal Orientation: Day: Correct Recall: "Sock": Yes, after cueing ("something to wear") Recall: "Blue": Yes, no cue required Recall: "Bed": Yes, no cue required BIMS Summary Score: 14 Sensation Sensation Light Touch: Impaired by Khiev assessment Peripheral sensation comments: Peripheral neuropathy in B feet Light Touch Impaired Details: Impaired LLE;Impaired RLE Hot/Cold: Appears Intact Proprioception: Appears Intact Stereognosis: Not tested Coordination Dacy Motor Movements are Fluid and Coordinated: No Fine Motor Movements are Fluid and Coordinated: Yes Coordination and Movement Description: Global deconditioning leading to poor motor planning and coordination, however improved since initial evaluation Motor  Motor Motor: Abnormal  postural alignment and control;Hemiplegia Motor - Discharge Observations: Mild right hemi LE>UE, however improved since initial evaluation Mobility  Bed Mobility Bed Mobility: Rolling Right;Rolling Left;Supine to Sit;Sit to Supine Rolling Right: Independent with assistive device Rolling Left: Independent with assistive device Supine to Sit: Independent with assistive device Sit to Supine: Independent with assistive device Transfers Sit to Stand: Supervision/Verbal cueing Stand to Sit: Supervision/Verbal cueing  Trunk/Postural Assessment  Cervical Assessment Cervical Assessment: Exceptions to [facility] (Forward flexed head) Thoracic Assessment Thoracic Assessment: Exceptions to [facility] (Rounded shoulders) Lumbar Assessment Lumbar Assessment: Exceptions to [facility] (Posterior pelvic tilt)  Balance Static Sitting Balance Static Sitting - Balance Support: Feet unsupported;Bilateral upper extremity supported Static Sitting - Level of Assistance: 6: Modified independent (Device/Increase time) Dynamic Sitting Balance Dynamic Sitting - Balance Support: During functional activity;Feet unsupported Dynamic Sitting - Level of Assistance: 5: Stand by assistance Static Standing Balance Static Standing - Balance Support: Bilateral upper extremity supported Static Standing - Level of Assistance: 5: Stand by assistance Dynamic Standing Balance Dynamic Standing - Balance Support: Bilateral upper extremity supported;During functional activity Dynamic Standing - Level of Assistance: 5: Stand by assistance Extremity/Trunk Assessment RUE Assessment RUE Assessment: Exceptions to [facility] General Strength Comments: 4/5 R UE, improved since eval LUE Assessment LUE Assessment: Within Functional Limits   [name] [name] 09/29/2022, 8:50 AM

## 2022-09-29 NOTE — Plan of Care (Signed)
  Problem: RH Pre-functional/Other (Specify) Goal: RH LTG SLP (Specify) 1 Description: RH LTG SLP (Specify) 1 Outcome: Completed/Met   

## 2022-09-29 NOTE — Progress Notes (Signed)
Physical Therapy Session Note  Patient Details  Name: [name] MRN: [id number] Date of Birth: June 16, 1950  Today's Date: 09/29/2022 PT Individual Time: 4098-1191 + 1450-1534 PT Individual Time Calculation (min): 28 min   Short Term Goals: Week 2:  PT Short Term Goal 1 (Week 2): STGs=LTGs secondary to ELOS  Skilled Therapeutic Interventions/Progress Updates:    SESSION 1: Pt presents in room in [facility], agreeable to PT. Pt denies pain. Session focused on gait training for distance and stair negotiation with education provided on safety completing stairs at DC. Pt completes all transfers with supervision.  Pt completes stand and ambulates from room to main gym 154' with close supervision, verbal cues for proximity to RW and increasing RLE step height. Pt completes seated rest break, requires supervision for positioning prior to sitting in arm chair, min assist due to increased difficulty navigating RW backwards. Pt then ambulates up/down 5 steps with SHR CGA/close supervision, ascending leading with RLE, descending leading with RLE. Increased time to complete. Pt provided with education on placement of RW at bottom of steps to improve safety. Pt requires seated rest break then completes ambulation back to room 154' with supervision.  Pt returns to sitting in [facility] and remains seated with all needs within reach, call light in place, and posey belt alarm donned and activated at end of session.  SESSION 2: Pt presents in room in [facility], denies pain and agreeable to PT. Session focused on WC mobility training, NMR for gait speed and fall risk assessment, and bed mobility training. Pt completes sit<>stand and stand pivot transfers with supervision and RW.  Pt self propels in [facility] from room to main gym 150' with slow propulsion speed, increased time to complete, requires supervision for navigating busy hallway and turns as well as positioning chair for transfer. Following seated rest break pt completes TUG  with RW, supervision, for assessment of falls risk with trials as follows: Trial 1: 42.77 seconds Trial 2: 43.01 seconds Trial 3: 46.38 seconds Average speed: 44.05 seconds indicating high fall risk  Pt then positioned next to mat, completes stand pivot transfer with RW. Pt attempts bed mobility on flat mat without assistive device, requires increased time and min assist for positioning LLE on mat. Pt returns to sitting independently and following seated rest break for tolerance to upright pt completes transfer back to WC. Pt returned to room dependently for time management and completes stand pivot transfer with RW back to bed, educated on [facility] parts management and locking brakes prior to stand. Pt doffs shoes independently in sitting. Pt then completes bed mobility independent with increased time for LLE into bed using bed rail. Pt remains supine with heels elevated, all needs within reach, call light in place, and bed alarm activated at end of session.  Therapy Documentation Precautions:  Precautions Precautions: Fall Precaution Comments: urinary incontinence Required Braces or Orthoses: Other Brace Other Brace: Left DARCO boot for chronic wound Restrictions Weight Bearing Restrictions: No   Therapy/Group: Individual Therapy  Edwin Cap PT, DPT 09/29/2022, 4:04 PM

## 2022-09-29 NOTE — Progress Notes (Signed)
PROGRESS NOTE   Subjective/Complaints:  Is peeing now- Bladder scans 146 and 210, but didn't need cathing overnight- was able to void enough.  Required SMOG enema yesterday due to  severe constipation- had multiple large BM's per nursing   Bottom hurts Didn't get to sleep til 2am- secondary to bowels.     ROS:   Pt denies SOB, abd pain, CP, N/V/C/D, and vision changes   + Lethargy-improved + Memory deficits-improved + Urinary retention- started over weekend - somewhat better + Constipation - improved- greatly after SMOG  Objective:   US RENAL  Result Date: 09/28/2022 CLINICAL DATA:  Urinary retention EXAM: RENAL / URINARY TRACT ULTRASOUND COMPLETE COMPARISON:  None Available. FINDINGS: Right Kidney: Renal measurements: 11.7 x 5.3 x 5.9 cm = volume: 189.3 mL. Echogenicity within normal limits. No mass or hydronephrosis visualized. Left Kidney: Renal measurements: 9.3 x 5.9 x 5.1 cm = volume: 146.2 mL. Echogenicity within normal limits. No mass or hydronephrosis visualized. Bladder: Appears normal for degree of bladder distention. Other: None. IMPRESSION: Normal examination. Electronically Signed   By: Jacob Moores M.D.   On: 09/28/2022 13:09   DG Abd 1 View  Result Date: 09/28/2022 CLINICAL DATA:  Constipation EXAM: ABDOMEN - 1 VIEW COMPARISON:  None Available. FINDINGS: Scattered large and small bowel gas is noted. Fecal material is noted throughout the colon consistent with constipation. No rectal impaction is seen. No free air is noted. No bony abnormality is noted. IMPRESSION: Changes consistent with colonic constipation. Electronically Signed   By: Alcide Clever M.D.   On: 09/28/2022 12:14   Recent Labs    09/27/22 1157  WBC 5.5  HGB 11.2*  HCT 32.8*  PLT 210    Recent Labs    09/27/22 1533 09/28/22 0735 09/29/22 0734  NA 134*  --  136  K 4.0  --  3.8  CL 99  --  105  CO2 25  --  24  GLUCOSE 184*  --   153*  BUN 42*  --  38*  CREATININE 1.71* 1.41* 1.52*  CALCIUM 9.1  --  8.6*     Intake/Output Summary (Last 24 hours) at 09/29/2022 0856 Last data filed at 09/29/2022 0758 Poss per 24 hour  Intake 840 ml  Output 510 ml  Net 330 ml        Physical Exam: Vital Signs Blood pressure (!) 153/48, pulse 70, temperature 97.8 F (36.6 C), temperature source Oral, resp. rate 16, height 5\' 9"  (1.753 m), weight 103 kg, SpO2 96%.     General: awake, alert, appropriate, once I woke her up- took a lot of verbal stimuli to wake pt; NAD HENT: conjugate gaze; oropharynx moist CV: regular rate and rhythm; no JVD Pulmonary: CTA B/L; no W/R/R- good air movement GI: soft, NT, ND, (+)BS- more normoactive Psychiatric: appropriate- but flat- just woke her up Neurological: Ox3 Slightly delayed responses + Memory deficit - mild, improved since starting ritalin + Cognitive slowing - MUCH improved since starting ritalin Cranial nerves II through XII intact, antigravity and against resistance 4/5 in all extremities, with exception of 3+ out of 5 in bilateral hip flexors, consistent with prior exams. Sensory exam absent  LT ensation below knees bilaterally  Musculoskeletal: Full range of motion in all 4 extremities.   Extremities: 2+ nonpitting edema in bilateral lower extremities.  -Unchanged Skin:  + heel wounds  + Stage II to coccyx on admission                  Assessment/Plan: 1. Functional deficits which require 3+ hours per day of interdisciplinary therapy in a comprehensive inpatient rehab setting. Physiatrist is providing close team supervision and 24 hour management of active medical problems listed below. Physiatrist and rehab team continue to assess barriers to discharge/monitor patient progress toward functional and medical goals  Care Tool:  Bathing    Body parts bathed by patient: Right arm, Left arm, Chest, Abdomen, Front perineal area, Right upper leg, Left upper  leg, Face, Buttocks, Left lower leg, Right lower leg   Body parts bathed by helper: Buttocks, Left lower leg, Right lower leg     Bathing assist Assist Level: Supervision/Verbal cueing Assistive Device Comment: LH sponge   Upper Body Dressing/Undressing Upper body dressing   What is the patient wearing?: Pull over shirt    Upper body assist Assist Level: Set up assist    Lower Body Dressing/Undressing Lower body dressing      What is the patient wearing?: Pants, Underwear/pull up     Lower body assist Assist for lower body dressing: Supervision/Verbal cueing     Toileting Toileting    Toileting assist Assist for toileting: Supervision/Verbal cueing     Transfers Chair/bed transfer  Transfers assist     Chair/bed transfer assist level: Supervision/Verbal cueing     Locomotion Ambulation   Ambulation assist      Assist level: Supervision/Verbal cueing Assistive device: Walker-rolling Max distance: 150'   Walk 10 feet activity   Assist     Assist level: Supervision/Verbal cueing Assistive device: Walker-rolling   Walk 50 feet activity   Assist Walk 50 feet with 2 turns activity did not occur: Safety/medical concerns (Patient unable to ambulate >15' at this time secondary to poor endurance/activity tolerance)  Assist level: Supervision/Verbal cueing Assistive device: Walker-rolling    Walk 150 feet activity   Assist Walk 150 feet activity did not occur: Safety/medical concerns  Assist level: Supervision/Verbal cueing Assistive device: Walker-rolling    Walk 10 feet on uneven surface  activity   Assist Walk 10 feet on uneven surfaces activity did not occur: Safety/medical concerns   Assist level: Supervision/Verbal cueing Assistive device: Walker-rolling   Wheelchair     Assist Is the patient using a wheelchair?: Yes Type of Wheelchair: Manual    Wheelchair assist level: Supervision/Verbal cueing Max wheelchair distance:  150' but increased time required to complete    Wheelchair 50 feet with 2 turns activity    Assist        Assist Level: Supervision/Verbal cueing   Wheelchair 150 feet activity     Assist      Assist Level: Supervision/Verbal cueing   Blood pressure (!) 153/48, pulse 70, temperature 97.8 F (36.6 C), temperature source Oral, resp. rate 16, height 5\' 9"  (1.753 m), weight 103 kg, SpO2 96%.  1. Functional deficits secondary to left basal ganglia and left parietal small/punctate infarcts as well as history of right MCA infarction.   Current weakness from prior CVA  -patient may shower             -ELOS/Goals: 7 to 10 days, supervision with PT and OT goals - 7/11 DC date -> 7/12 to  work on voiding d/t patient and sister not willing to [facility]    - 7/9: Cognition and carryover limiting to therapy progress; will get SLP eval on discharge day. CGA for balance and poor awareness, slow. Close SPV and sister available for all ADLs. SPV bed mobility; will need bed rail. Standby assist for STS. Discussed with family at training yesterday she will need hands on assistance for all things. Will get H/H. Will need handicapped placcard. Con't CIR PT, OT and SLP= constipation and bladder retention doing better  2.  Antithrombotics: -DVT/anticoagulation:  Pharmaceutical: Lovenox             -antiplatelet therapy: Aspirin 81 mg daily and Plavix 75 mg daily x 3 weeks then Plavix alone 3. Pain Management: Voltaren gel 4 g 4 times daily, Tylenol as needed.   - Added K-pad for right shoulder pain. 4. Mood/Behavior/Sleep: Wellbutrin 300 mg daily, Lexapro 20 mg daily, melatonin as needed             -antipsychotic agents: N/A   - 7/2: Discussed with the patient, agreeable to increase antidepressant medications.  Already on max dose Lexapro, will transition from Wellbutrin 300 mg XR to Wellbutrin 200 mg SR twice daily.   - 7/8: No appreciable effect; denying SI/HI  - 7/9: Decrease back to 300 mg daily XR  given increased urinary retention, see below  - 7/10: Still retaining, but with some improved sensation to void this AM after decreasing Wellbutrin yesterday (T1/2 approx. 21 hours); also endorsing thirst, hot flashes - ?serotonin syndrome, will DC Wellbutrin and continue Lexapro only for now  5. Neuropsych/cognition: This patient is capable of making decisions on her own behalf.  - 7-5: Patient agreeable to trial of CPAP, RT consulted   - 7/8: After discussion of risks vs. Benefits, will start small dose of Ritalin 5 mg BID in AM. Also SLP consult for cognition evaluation. RT has not brought CPAP; per sister had been many years since sleep study/titration, would need re-assessed as OP if needed but may do overnight pulse ox study here.   - 7/9: Carryover, cognition, alertness seem much better with Ritalin; continue, along with decrease Wellbutrin as above.    - 7/10: Cognition remains improved, frequent cathing interrupting sleep, worsening fatigue  7/11- poor sleep didn't fall to sleep til 2am due to bowels 6. Skin/Wound Care:/Bilateral diabetic foot ulcers.  Skin care as directed routine skin checks  -Mepilex to foot ulcers, Prevalon boots at nighttime  -Protein supplementation, daily Zinc 220 mg and Vitamin C 500 mg supplementation   7. Fluids/Electrolytes/Nutrition: Routine in and outs with follow-up chemistries   - Hyponatremia 7/9; see below   8.  Diabetes mellitus with peripheral neuropathy.  Latest hemoglobin A1c 7.0.  NovoLog Mix 70/30 20 units twice daily             -Prior use of metformin, to restart if needed  -Monitor today, blood glucose mildly elevated overnight.  6/29- will increase 70/30 to 22 units BID since running 140s to 212- since Cr elevated, don't feel comfortable starting Metformin.   6/30- much better since increased 70/30- down to 110s-120s from 200's; STABLE 7/8  7/9: uptrending, increase 70/30 to 25U BID - improved  7/11- somewhat improved- 95-212- wait til  tomorrow to make changes if needed Recent Labs    09/28/22 1723 09/28/22 2028 09/29/22 0652  GLUCAP 182* 212* 95     9.  Hypertension.  Coreg 3.125 mg twice daily, Norvasc 5 mg daily (  half home dose).  Monitor with increased mobility.  Long-term BP goal normotensive.  Avoid hypotension/dehydration.  - 6/27: Hypertensive 170s overnight, neurology recommending higher BP goals due to orthostasis.  Will order as needed hydralazine 10 mg every 8 hours for SBP greater than 180, DBP greater than 110.  Teds and abdominal binder ordered.   - 6/28: 150s to 170s today, BS adequate, increase Norvasc to 10 mg   - 7/2: Resume Losartan at 25 mg daily -will wait 1 to 2 days to see effect; had creatinine this a.m. 1.6 mildly elevated from last take but within baseline as below  - 7/5: BMP not obtained this a.m., will hold off on losartan increase due to normotension and check on Monday, mildly increase sys BP monitor prior to dose change  - 7/8- 130s-150s; contriolled; monitor  - 7/10: BP stable, however with new mild hyponatremia w/ Losartan; may need to adjust pending results in AM  7/11- Na 136- doing better- con't Losartan for now- BP 150's systolic- but usually 130s SBP-con't regimen    09/29/2022    5:21 AM 09/28/2022    7:48 PM 09/28/2022    2:10 PM  Vitals with BMI  Systolic 153 139 409  Diastolic 48 79 50  Pulse 70 71 70    10.  Obesity.  BMI 33.52.  Dietary follow-up 11.  CKD IIIa, creatinine baseline 1.57-1.84.  Follow-up chemistries  -Creatinine 1.5 this a.m., at baseline, monitor - 1.4; stable  7/11- Cr 1.52 and BUN 38- still better than 1.7 3 days ago- is baseline  12.  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  Follow-up outpatient Dr Pamelia Hoit.  Continue Nolvadex. 13.  Hyperlipidemia.  Lipitor 80 mg daily 14.  Right shoulder pain             -Improved, continue Voltaren gel    15. Constipation.  No recorded bowel  movement since admission.  Ordered senna 1 tab nightly, MiraLAX daily  - Small BM 6/28; monitor  - First decent BM 6/28 per pt.   - LBM 7/7 -scheduled sorbitol this is p.m. - 7/9: Sorbitol for bowel movement today.  No neurologic signs or symptoms to indicate recurrent stroke or cauda equina.  - BM 7/9 with sorbitol; KUB today with chronic constipation, increase bowel regimen to Miralax BIS and Sennakot BID. PRN suppository added. SMOG enema ordered with results  7/11- SMOG given yesterday- multiple large BM's- feels better, but bottom sore.  16.  Urinary urgency/retention/UTI - Neurogenic bladder? Start Ditropan 2.5 mg twice daily.   6/29- Pt's PVRs up to 200s- 369 and another value of 325cc- Has not been cathed so far- has been able to void.   6/30- will check U/A and Cx- since malodorous and still retaining- last checked 6/22 and had (+) Nitrites and small leuks- likely has become UTI? Pending at this time.   7/1 - UA appears UTI, Cx pending, culture from 6-22 showing pansensitive E. Coli; initially determine asymptomatic bacteria, however patient was slowed cognition, increased confusion, and increased urinary urgency from last hospitalization.  Will start Keflex 500 mg BID for 7 days; allergic to Bactrim and Cipro. Still 200-300 on bladder scans.   7-3: Bladder scans lower overnight, but a few incontinent episodes.  Discussed with patient this a.m.  Urinary cultures Keflex sensitive.   - 7/4: Ongoing urge and stress incontinence, with borderline retention. Concerned for cholinergic s/e with ongoing confusion, lethargy. DC ditropan,  switch to myrbetriq in AM.   7-5: Required ISC 1 time overnight.  Switching from Ditropan to Myrbetriq today; so far PVRs remain low.  If further ISC use this weekend, would discontinue Myrbetriq  7/8: DC myrbetriq, start Flomax 0.4 mg at bedtime, timed toiletting for high retention  7/9: Worsening retention, now requiring ISC for all voids.  Will get UA to ensure  p.o. antibiotic treatment with adequate; if signs of ongoing infection will start IV ceftriaxone and get repeat cultures.  Increase Flomax to 0.8 mg.   7/10: UTI treated/cleared. CBC wnl. Cognitive improvements/stable. Ongoing severe retention; no obstruction on KUB and renal US, constipation Tx as above, today will start oxybutynin.   7/11- Better/improved urinary retention- no caths required overnight-   -will con't to monitor 17. OSA. Per patient recently sold her CPAP machine.  Discussed RT consult for inpatient use, patient refuses, states she was unable to tolerate it and is not willing to try again.  Will follow-up.  -7-3: Still refusing CPAP, despite significant daytime lethargy.  - 7-5: Patient agreeable to trial of CPAP; RT consulted   18. Hyponatremia 134 7/9. Start fluid restriction, urine studies. Likely related to resuming losartan. Also large fluid intakes over past 2 days.  - 7/10: Urine Na, Osm appear SIADH. Repeat BMP in AM, may need to DC losartan or reduce dose if downtrending.   7/11- Na 136- con't Losartan    I spent a total of 36   minutes on total care today- >50% coordination of care- due to  Complex medical issues as well as d/w nursing   LOS:   15 days A FACE TO FACE EVALUATION WAS PERFORMED  Dorion Petillo 09/29/2022, 8:56 AM

## 2022-09-29 NOTE — Plan of Care (Signed)
  Problem: RH Balance Goal: LTG: Patient will maintain dynamic sitting balance (OT) Description: LTG:  Patient will maintain dynamic sitting balance with assistance during activities of daily living (OT) Outcome: Completed/Met Goal: LTG Patient will maintain dynamic standing with ADLs (OT) Description: LTG:  Patient will maintain dynamic standing balance with assist during activities of daily living (OT)  Outcome: Completed/Met   Problem: Sit to Stand Goal: LTG:  Patient will perform sit to stand in prep for activites of daily living with assistance level (OT) Description: LTG:  Patient will perform sit to stand in prep for activites of daily living with assistance level (OT) Outcome: Completed/Met   Problem: RH Bathing Goal: LTG Patient will bathe all body parts with assist levels (OT) Description: LTG: Patient will bathe all body parts with assist levels (OT) Outcome: Completed/Met   Problem: RH Dressing Goal: LTG Patient will perform upper body dressing (OT) Description: LTG Patient will perform upper body dressing with assist, with/without cues (OT). Outcome: Completed/Met Goal: LTG Patient will perform lower body dressing w/assist (OT) Description: LTG: Patient will perform lower body dressing with assist, with/without cues in positioning using equipment (OT) Outcome: Completed/Met   Problem: RH Toileting Goal: LTG Patient will perform toileting task (3/3 steps) with assistance level (OT) Description: LTG: Patient will perform toileting task (3/3 steps) with assistance level (OT)  Outcome: Completed/Met   Problem: RH Functional Use of Upper Extremity Goal: LTG Patient will use RT/LT upper extremity as a (OT) Description: LTG: Patient will use right/left upper extremity as a stabilizer/[place] assist/diminished/nondominant/dominant level with assist, with/without cues during functional activity (OT) Outcome: Completed/Met   Problem: RH Toilet Transfers Goal: LTG Patient will  perform toilet transfers w/assist (OT) Description: LTG: Patient will perform toilet transfers with assist, with/without cues using equipment (OT) Outcome: Completed/Met   Problem: RH Tub/Shower Transfers Goal: LTG Patient will perform tub/shower transfers w/assist (OT) Description: LTG: Patient will perform tub/shower transfers with assist, with/without cues using equipment (OT) Outcome: Completed/Met   

## 2022-09-29 NOTE — Plan of Care (Signed)
  Problem: RH Balance Goal: LTG Patient will maintain dynamic standing balance (PT) Description: LTG:  Patient will maintain dynamic standing balance with assistance during mobility activities (PT) 09/29/2022 1544 by Edwin Cap, PT Outcome: Completed/Met 09/29/2022 1521 by Edwin Cap, PT Flowsheets (Taken 09/29/2022 1459) LTG: Pt will maintain dynamic standing balance during mobility activities with:: Supervision/Verbal cueing   Problem: Sit to Stand Goal: LTG:  Patient will perform sit to stand with assistance level (PT) Description: LTG:  Patient will perform sit to stand with assistance level (PT) 09/29/2022 1544 by Edwin Cap, PT Outcome: Completed/Met 09/29/2022 1521 by Edwin Cap, PT Flowsheets (Taken 09/29/2022 1459) LTG: PT will perform sit to stand in preparation for functional mobility with assistance level: Supervision/Verbal cueing   Problem: RH Bed Mobility Goal: LTG Patient will perform bed mobility with assist (PT) Description: LTG: Patient will perform bed mobility with assistance, with/without cues (PT). Outcome: Completed/Met   Problem: RH Bed to Chair Transfers Goal: LTG Patient will perform bed/chair transfers w/assist (PT) Description: LTG: Patient will perform bed to chair transfers with assistance (PT). 09/29/2022 1544 by Edwin Cap, PT Outcome: Completed/Met 09/29/2022 1521 by Edwin Cap, PT Flowsheets (Taken 09/15/2022 1428 by Rafoth, Sherron Ales, PT) LTG: Pt will perform Bed to Chair Transfers with assistance level: Supervision/Verbal cueing   Problem: RH Car Transfers Goal: LTG Patient will perform car transfers with assist (PT) Description: LTG: Patient will perform car transfers with assistance (PT). Outcome: Completed/Met   Problem: RH Furniture Transfers Goal: LTG Patient will perform furniture transfers w/assist (OT/PT) Description: LTG: Patient will perform furniture transfers  with assistance (OT/PT). Outcome: Completed/Met    Problem: RH Ambulation Goal: LTG Patient will ambulate in home environment (PT) Description: LTG: Patient will ambulate in home environment, # of feet with assistance (PT). Outcome: Completed/Met Goal: LTG Patient will ambulate in community environment (PT) Description: LTG: Patient will ambulate in community environment, # of feet with assistance (PT). Outcome: Completed/Met   Problem: RH Wheelchair Mobility Goal: LTG Patient will propel w/c in community environment (PT) Description: LTG: Patient will propel wheelchair in community environment, # of feet with assist (PT) 09/29/2022 1544 by Edwin Cap, PT Outcome: Completed/Met 09/29/2022 1521 by Edwin Cap, PT Flowsheets (Taken 09/15/2022 1428 by Rafoth, Sherron Ales, PT) LTG: Pt will propel w/c in community environ  assist needed:: Supervision/Verbal cueing Distance: wheelchair distance in controlled environment: 150   Problem: RH Stairs Goal: LTG Patient will ambulate up and down stairs w/assist (PT) Description: LTG: Patient will ambulate up and down # of stairs with assistance (PT) 09/29/2022 1544 by Edwin Cap, PT Outcome: Completed/Met 09/29/2022 1521 by Edwin Cap, PT Flowsheets (Taken 09/15/2022 1428 by Rafoth, Sherron Ales, PT) LTG: Pt will ambulate up/down stairs assist needed:: Contact Guard/Touching assist LTG: Pt will  ambulate up and down number of stairs: 5

## 2022-09-29 NOTE — Progress Notes (Signed)
Occupational Therapy Session Note  Patient Details  Name: TYNESIA HARRAL MRN: [id number] Date of Birth: May 21, 1950  Today's Date: 09/29/2022 OT Individual Time: 0820-0900 OT Individual Time Calculation (min): 40 min    Short Term Goals: Week 2:  OT Short Term Goal 1 (Week 2): LTG=STG 2/2 ELOS      Skilled Therapeutic Interventions/Progress Updates:    Pt received lying supine in bed CNA in to help pt get OOB.  No c/o pain and receptive to OT treatment session.  Pt requested to shower.  Sit<>stand with CGA and RW.  Functional mobility ~39ft to bathroom with CGA and RW pt needed to void.  Toileting tasks performed with (S).  Pt able to complete UB/LB bathing with (S).  Fucntional mobility to wheelchair for dressing tasks with CGA and RW ~10 ft.  UB dressing with (S) and LB dressing with Min A for TED hose placement and threading pants over feet.  Dressing changes to back of heels and medial side of left foot.  Pt left sitting in wheelchair per her request with chair alarm activated, call light within reach and all needs met.  Therapy Documentation Precautions:  Precautions Precautions: Fall Precaution Comments: urinary incontinence Required Braces or Orthoses: Other Brace Other Brace: Left DARCO boot for chronic wound Restrictions Weight Bearing Restrictions: No      Therapy/Group: Individual Therapy  Liam Graham 09/29/2022, 1:31 PM

## 2022-09-30 ENCOUNTER — Ambulatory Visit: Payer: PPO | Admitting: Neurology

## 2022-09-30 LAB — GLUCOSE, CAPILLARY: Glucose-Capillary: 112 mg/dL — ABNORMAL HIGH (ref 70–99)

## 2022-09-30 NOTE — Progress Notes (Signed)
Provided dressing changes and instructions for dressing changes and for double voiding with every toileting.

## 2022-09-30 NOTE — Progress Notes (Signed)
Inpatient Rehabilitation Discharge Medication Review by a Pharmacist  A complete drug regimen review was completed for this patient to identify any potential clinically significant medication issues.  High Risk Drug Classes Is patient taking? Indication by Medication  Antipsychotic No   Anticoagulant No   Antibiotic No   Opioid No   Antiplatelet Yes Aspirin 81 mg, clopidogrel - CVA prophylaxis  Hypoglycemics/insulin Yes Insulin aspart, 70/30, metformin - diabetes mellitus  Vasoactive Medication Yes Amlodipine, carvedilol, losartan - hypertension  Chemotherapy No   Other Yes Atorvastatin - hyperlipidemia Tamoxifen - hx breast cancer Escitalopram - depression Methylphenidate - alertness Bethanechol, tamsulosin - urinary retention Vitamin C, Vitamin D, Zinc - supplements Diclofenac gel - topical pain relief (R shoulder) Miralax, senna-docusate - laxatives  PRNs; Acetaminophen - mild pain or temp >99.5 F     Type of Medication Issue Identified Description of Issue Recommendation(s)  Drug Interaction(s) (clinically significant)     Duplicate Therapy     Allergy     No Medication Administration End Date     Incorrect Dose     Additional Drug Therapy Needed     Significant med changes from prior encounter (inform family/care partners about these prior to discharge). Bupropion discontinued as possibly contributing to urinary retention. New bethanechol, tamsulosin, methylphenidate.  Communicate changes with patient/family.  Other       Clinically significant medication issues were identified that warrant physician communication and completion of prescribed/recommended actions by midnight of the next day:  No  Pharmacist comments:  - Aspirin and Clopidogrel planned for 3 weeks, then Clopidogrel alone.  Stop date for Aspirin is in place for 10/02/22.  Time spent performing this drug regimen review (minutes):  20   Dennie Fetters, [place] 09/30/2022 8:16 AM

## 2022-09-30 NOTE — Progress Notes (Signed)
Inpatient Rehabilitation Care Coordinator Discharge Note   Patient Details  Name: [name] MRN: [id number] Date of Birth: 02-13-1951   Discharge location: D/c to her sister [name]'s home  Length of Stay: 15 days  Discharge activity level: Supervision  Home/community participation: Limited  Patient response XB:MWUXLK Literacy - How often do you need to have someone help you when you read instructions, pamphlets, or other written material from your doctor or pharmacy?: Rarely  Patient response GM:WNUUVO Isolation - How often do you feel lonely or isolated from those around you?: Never  Services provided included: MD, RD, PT, OT, RN, CM, TR, Pharmacy, Neuropsych, SW  Financial Services:  Field seismologist Utilized: Banker  Choices offered to/list presented to: patient and pt sister  Follow-up services arranged:  Home Health Home Health Agency: [facility] for HHPT/OT         Patient response to transportation need: Is the patient able to respond to transportation needs?: Yes In the past 12 months, has lack of transportation kept you from medical appointments or from getting medications?: No In the past 12 months, has lack of transportation kept you from meetings, work, or from getting things needed for daily living?: No   Patient/Family verbalized understanding of follow-up arrangements:  Yes  Individual responsible for coordination of the follow-up plan: contact [name] #  Confirmed correct DME delivered: Gretchen Short 09/30/2022    Comments (or additional information):fam edu completed.   Summary of Stay    Date/Time Discharge Planning CSW  09/27/22 1004 D/c plan remains to her sister [name]'s home who will provide 24/7care . Sister needs her to be as independent as possible since she is not able provide physical care, and also caring for her husband. Fam edu completed on Monday (7/8) 8am-12pm. SW will confirm there  are no barriers to discharge. AAC  09/20/22 1021 D/c to her sister [name]'s home who will provide 24/7care . Sister needs her to be as independent as possible since she is not able provide physical care, and also caring for her husband. SW will confirm there are no barriers to discharge. AAC       [name] A [name]

## 2022-10-03 DIAGNOSIS — Z6834 Body mass index (BMI) 34.0-34.9, adult: Secondary | ICD-10-CM | POA: Diagnosis not present

## 2022-10-03 DIAGNOSIS — I69351 Hemiplegia and hemiparesis following cerebral infarction affecting right dominant side: Secondary | ICD-10-CM | POA: Diagnosis not present

## 2022-10-05 DIAGNOSIS — Z8673 Personal history of transient ischemic attack (TIA), and cerebral infarction without residual deficits: Secondary | ICD-10-CM | POA: Diagnosis not present

## 2022-10-05 DIAGNOSIS — I129 Hypertensive chronic kidney disease with stage 1 through stage 4 chronic kidney disease, or unspecified chronic kidney disease: Secondary | ICD-10-CM | POA: Diagnosis not present

## 2022-10-05 DIAGNOSIS — E669 Obesity, unspecified: Secondary | ICD-10-CM | POA: Diagnosis not present

## 2022-10-05 DIAGNOSIS — F3341 Major depressive disorder, recurrent, in partial remission: Secondary | ICD-10-CM | POA: Diagnosis not present

## 2022-10-05 DIAGNOSIS — N3281 Overactive bladder: Secondary | ICD-10-CM | POA: Diagnosis not present

## 2022-10-05 DIAGNOSIS — E1122 Type 2 diabetes mellitus with diabetic chronic kidney disease: Secondary | ICD-10-CM | POA: Diagnosis not present

## 2022-10-05 DIAGNOSIS — N183 Chronic kidney disease, stage 3 unspecified: Secondary | ICD-10-CM | POA: Diagnosis not present

## 2022-10-05 DIAGNOSIS — R339 Retention of urine, unspecified: Secondary | ICD-10-CM | POA: Diagnosis not present

## 2022-10-05 DIAGNOSIS — I700 Atherosclerosis of aorta: Secondary | ICD-10-CM | POA: Diagnosis not present

## 2022-10-05 DIAGNOSIS — Z794 Long term (current) use of insulin: Secondary | ICD-10-CM | POA: Diagnosis not present

## 2022-10-05 DIAGNOSIS — E785 Hyperlipidemia, unspecified: Secondary | ICD-10-CM | POA: Diagnosis not present

## 2022-10-05 DIAGNOSIS — G4730 Sleep apnea, unspecified: Secondary | ICD-10-CM | POA: Diagnosis not present

## 2022-10-06 ENCOUNTER — Other Ambulatory Visit (HOSPITAL_COMMUNITY): Payer: Self-pay

## 2022-10-06 DIAGNOSIS — H35033 Hypertensive retinopathy, bilateral: Secondary | ICD-10-CM | POA: Diagnosis not present

## 2022-10-06 DIAGNOSIS — H43813 Vitreous degeneration, bilateral: Secondary | ICD-10-CM | POA: Diagnosis not present

## 2022-10-06 DIAGNOSIS — H34831 Tributary (branch) retinal vein occlusion, right eye, with macular edema: Secondary | ICD-10-CM | POA: Diagnosis not present

## 2022-10-13 ENCOUNTER — Other Ambulatory Visit (HOSPITAL_COMMUNITY): Payer: Self-pay

## 2022-10-14 ENCOUNTER — Telehealth: Payer: Self-pay | Admitting: Internal Medicine

## 2022-10-14 ENCOUNTER — Encounter: Payer: Self-pay | Admitting: Internal Medicine

## 2022-10-14 NOTE — Telephone Encounter (Signed)
Pt sister called with concerning Systolic readings. Please see below and advise.   Pt c/o BP issue: STAT if pt c/o blurred vision, one-sided weakness or slurred speech   1. What are your last 5 BP readings?  Systolic taken around noon: 146, 137, 149, 144, 134, 158, 145, 147   2. Are you having any other symptoms (ex. Dizziness, headache, blurred vision, passed out)?    3. What is your BP issue?    Patient's sister called in to report elevated systolic readings + update Dr. Rennis Golden that patient has been in the hospital multiple times since May. She had 2 strokes, went to rehab for 3 weeks, now she is back home.

## 2022-10-14 NOTE — Telephone Encounter (Signed)
Error

## 2022-10-14 NOTE — Telephone Encounter (Signed)
Sister returned staff call.

## 2022-10-14 NOTE — Telephone Encounter (Signed)
Pt c/o BP issue: STAT if pt c/o blurred vision, one-sided weakness or slurred speech  1. What are your last 5 BP readings?  Systolic taken around noon: 146, 137, 149, 144, 134, 158, 145, 147  2. Are you having any other symptoms (ex. Dizziness, headache, blurred vision, passed out)?   3. What is your BP issue?   Patient's sister called in to report elevated systolic readings + update Dr. Rennis Golden that patient has been in the hospital multiple times since May. She had 2 strokes, went to rehab for 3 weeks, now she is back home.

## 2022-10-14 NOTE — Telephone Encounter (Signed)
Called to [place]. LVM to call office

## 2022-10-14 NOTE — Telephone Encounter (Signed)
Straight to VM again, Left detailed message regarding Dr Rennis Golden has reviewed information and will F/U on BP at her OV in August.  Call if any further quesstions

## 2022-10-16 NOTE — Progress Notes (Signed)
Patient Care Team: Laurann Montana, MD as PCP - General (Family Medicine) Rennis Golden Lisette Abu, MD as PCP - Cardiology (Cardiology) Van Clines, MD as Consulting Physician (Neurology) Serena Croissant, MD as Consulting Physician (Hematology and Oncology) Lonie Peak, MD as Attending Physician (Radiation Oncology) Abigail Miyamoto, MD as Consulting Physician (General Surgery)  DIAGNOSIS: No diagnosis found.  SUMMARY OF ONCOLOGIC HISTORY: Oncology History  Ductal carcinoma in situ (DCIS) of left breast  01/02/2020 Initial Diagnosis   Screening mammogram showed two groups of calcifications in the inferior medial quadrant of the left breast, 0.5cm and 0.9cm. Biopsy showed DCIS, grade 3, ER/PR negative.    01/08/2020 Cancer Staging   Staging form: Breast, AJCC 8th Edition - Clinical stage from 01/08/2020: Stage 0 (cTis (DCIS), cN0, cM0, ER-, PR-, HER2: Not Assessed)   02/25/2020 Surgery   Left lumpectomy Magnus Ivan) [phone]): high grade DCIS, 1.4cm, clear margins. No regional lymph nodes were examined.   03/31/2020 - 04/29/2020 Radiation Therapy   The patient initially received a dose of 40.05 Gy in 15 fractions to the breast using whole-breast tangent fields. This was delivered using a 3-D conformal technique. The pt received a boost delivering an additional 10 Gy in 5 fractions using a electron boost with electrons. The total dose was 50.05 Gy.   04/2020 - 04/2025 Anti-estrogen oral therapy   Tamoxifen     CHIEF COMPLIANT:   INTERVAL HISTORY: [name] is a   ALLERGIES:  is allergic to elavil [amitriptyline], pristiq [desvenlafaxine], cipro [ciprofloxacin hcl], penicillins, sulfa antibiotics, sulfamethoxazole, and sulfur.  MEDICATIONS:  Current Outpatient Medications  Medication Sig Dispense Refill   acetaminophen (TYLENOL) 325 MG tablet Take 2 tablets (650 mg total) by mouth every 4 (four) hours as needed for mild pain (or temp > 37.5 C (99.5 F)).     amLODipine  (NORVASC) 10 MG tablet Take 1 tablet (10 mg total) by mouth daily. 30 tablet 0   ascorbic acid (VITAMIN C) 500 MG tablet Take 1 tablet (500 mg total) by mouth daily. 100 tablet 0   aspirin EC 81 MG tablet Take 1 tablet (81 mg total) by mouth daily. Swallow whole.     atorvastatin (LIPITOR) 80 MG tablet Take 1 tablet (80 mg total) by mouth daily. 30 tablet 0   bethanechol (URECHOLINE) 10 MG tablet Take 1 tablet (10 mg total) by mouth 3 (three) times daily. 21 tablet 0   carvedilol (COREG) 3.125 MG tablet Take 1 tablet (3.125 mg total) by mouth 2 (two) times daily. 60 tablet 0   Cholecalciferol 125 MCG (5000 UT) TABS Take 1 tablet (5,000 Units total) by mouth every evening for 30 days then as directed by MD 100 tablet 0   clopidogrel (PLAVIX) 75 MG tablet Take 1 tablet (75 mg total) by mouth daily. 30 tablet 0   diclofenac Sodium (VOLTAREN) 1 % GEL Apply 4 g topically 4 (four) times daily. 400 g 0   escitalopram (LEXAPRO) 20 MG tablet Take 1 tablet (20 mg total) by mouth daily. 30 tablet 0   insulin aspart protamine- aspart (NOVOLOG MIX 70/30) (70-30) 100 UNIT/ML injection Inject 0.25 mLs (25 Units total) into the skin 2 (two) times daily with a meal. 10 mL 11   losartan (COZAAR) 25 MG tablet Take 1 tablet (25 mg total) by mouth daily. 30 tablet 0   metFORMIN (GLUCOPHAGE) 500 MG tablet Take 1 tablet (500 mg total) by mouth 2 (two) times daily with a meal. 60 tablet 0  methylphenidate (RITALIN) 5 MG tablet Take 1 tablet (5 mg total) by mouth 2 (two) times daily with breakfast and lunch. 60 tablet 0   polyethylene glycol (MIRALAX / GLYCOLAX) 17 g packet Take 17 g by mouth 2 (two) times daily.     senna-docusate (SENOKOT-S) 8.6-50 MG tablet Take 1 tablet by mouth 2 (two) times daily.     tamoxifen (NOLVADEX) 20 MG tablet TAKE ONE TABLET BY MOUTH EVERY MORNING (Patient taking differently: Take 20 mg by mouth daily.) 90 tablet 0   tamsulosin (FLOMAX) 0.4 MG CAPS capsule Take 2 capsules (0.8 mg total) by  mouth daily after supper. 60 capsule 0   Zinc Sulfate 220 (50 Zn) MG TABS Take 1 tablet (220 mg total) by mouth daily for 30 days then as directed by MD 100 tablet 0   No current facility-administered medications for this visit.    PHYSICAL EXAMINATION: ECOG PERFORMANCE STATUS: {CHL ONC ECOG [phone]}  There were no vitals filed for this visit. There were no vitals filed for this visit.  BREAST:*** No palpable masses or nodules in either right or left breasts. No palpable axillary supraclavicular or infraclavicular adenopathy no breast tenderness or nipple discharge. (exam performed in the presence of a chaperone)  LABORATORY DATA:  I have reviewed the data as listed    Latest Ref Rng & Units 09/29/2022    7:34 AM 09/28/2022    7:35 AM 09/27/2022    3:33 PM  CMP  Glucose 70 - 99 mg/dL 308   657   BUN 8 - 23 mg/dL 38   42   Creatinine 8.46 - 1.00 mg/dL 9.62  9.52  8.41   Sodium 135 - 145 mmol/L 136   134   Potassium 3.5 - 5.1 mmol/L 3.8   4.0   Chloride 98 - 111 mmol/L 105   99   CO2 22 - 32 mmol/L 24   25   Calcium 8.9 - 10.3 mg/dL 8.6   9.1   Total Protein 6.5 - 8.1 g/dL   7.8   Total Bilirubin 0.3 - 1.2 mg/dL   0.7   Alkaline Phos 38 - 126 U/L   47   AST 15 - 41 U/L   22   ALT 0 - 44 U/L   26     Lab Results  Component Value Date   WBC 5.5 09/27/2022   HGB 11.2 (L) 09/27/2022   HCT 32.8 (L) 09/27/2022   MCV 97.0 09/27/2022   PLT 210 09/27/2022   NEUTROABS 3.2 09/27/2022    ASSESSMENT & PLAN:  No problem-specific Assessment & Plan notes found for this encounter.    No orders of the defined types were placed in this encounter.  The patient has a good understanding of the overall plan. she agrees with it. she will call with any problems that may develop before the next visit here. Total time spent: 30 mins including face to face time and time spent for planning, charting and co-ordination of care   Sherlyn Lick, CMA 10/16/22    I Janan Ridge am  acting as a Neurosurgeon for [employer]  ***

## 2022-10-19 DIAGNOSIS — F332 Major depressive disorder, recurrent severe without psychotic features: Secondary | ICD-10-CM | POA: Diagnosis not present

## 2022-10-20 ENCOUNTER — Other Ambulatory Visit: Payer: Self-pay

## 2022-10-20 ENCOUNTER — Inpatient Hospital Stay: Payer: PPO | Attending: Hematology and Oncology | Admitting: Hematology and Oncology

## 2022-10-20 VITALS — BP 155/71 | HR 79 | Temp 97.3°F | Resp 18 | Ht 69.0 in | Wt 223.3 lb

## 2022-10-20 DIAGNOSIS — Z79810 Long term (current) use of selective estrogen receptor modulators (SERMs): Secondary | ICD-10-CM | POA: Diagnosis not present

## 2022-10-20 DIAGNOSIS — D0512 Intraductal carcinoma in situ of left breast: Secondary | ICD-10-CM | POA: Insufficient documentation

## 2022-10-20 DIAGNOSIS — Z923 Personal history of irradiation: Secondary | ICD-10-CM | POA: Insufficient documentation

## 2022-10-20 NOTE — Assessment & Plan Note (Addendum)
01/02/2020: Screening mammogram showed two groups of calcifications in the inferior medial quadrant of the left breast, 0.5cm and 0.9cm. Biopsy showed DCIS, grade 3, ER/PR negative.  02/25/20: Left lumpectomy [name]): high grade DCIS, 1.4cm, clear margins 04/02/19-04/29/20: XRT  Hospitalization 09/14/2022-09/30/2022: Left basal ganglia infarction  Tamoxifen toxicities: Recommended discontinuation of tamoxifen.  Stroke: Causing neurological deficits and difficulty with ambulation.  She is able to get around with the help of walker.  She is currently living with her sister.   Breast cancer surveillance: 1.  Breast exam 10/20/2022: Benign 2. mammogram: At [facility] 01/26/2022: Benign breast density category B 3.  Bone density 06/08/2022: T-score -0.5: Normal   Return to clinic in 1 year for follow-up

## 2022-10-24 DIAGNOSIS — N1831 Chronic kidney disease, stage 3a: Secondary | ICD-10-CM | POA: Diagnosis not present

## 2022-10-24 DIAGNOSIS — I69398 Other sequelae of cerebral infarction: Secondary | ICD-10-CM | POA: Diagnosis not present

## 2022-10-24 DIAGNOSIS — I10 Essential (primary) hypertension: Secondary | ICD-10-CM | POA: Diagnosis not present

## 2022-10-24 DIAGNOSIS — E1122 Type 2 diabetes mellitus with diabetic chronic kidney disease: Secondary | ICD-10-CM | POA: Diagnosis not present

## 2022-10-24 DIAGNOSIS — G4733 Obstructive sleep apnea (adult) (pediatric): Secondary | ICD-10-CM | POA: Diagnosis not present

## 2022-10-24 DIAGNOSIS — M25511 Pain in right shoulder: Secondary | ICD-10-CM | POA: Diagnosis not present

## 2022-10-24 DIAGNOSIS — Z7984 Long term (current) use of oral hypoglycemic drugs: Secondary | ICD-10-CM | POA: Diagnosis not present

## 2022-10-24 DIAGNOSIS — Z794 Long term (current) use of insulin: Secondary | ICD-10-CM | POA: Diagnosis not present

## 2022-10-24 DIAGNOSIS — I69919 Unspecified symptoms and signs involving cognitive functions following unspecified cerebrovascular disease: Secondary | ICD-10-CM | POA: Diagnosis not present

## 2022-10-24 DIAGNOSIS — E785 Hyperlipidemia, unspecified: Secondary | ICD-10-CM | POA: Diagnosis not present

## 2022-10-24 DIAGNOSIS — E1140 Type 2 diabetes mellitus with diabetic neuropathy, unspecified: Secondary | ICD-10-CM | POA: Diagnosis not present

## 2022-10-24 DIAGNOSIS — L89890 Pressure ulcer of other site, unstageable: Secondary | ICD-10-CM | POA: Diagnosis not present

## 2022-10-27 DIAGNOSIS — Z8673 Personal history of transient ischemic attack (TIA), and cerebral infarction without residual deficits: Secondary | ICD-10-CM | POA: Diagnosis not present

## 2022-10-27 DIAGNOSIS — I129 Hypertensive chronic kidney disease with stage 1 through stage 4 chronic kidney disease, or unspecified chronic kidney disease: Secondary | ICD-10-CM | POA: Diagnosis not present

## 2022-10-27 DIAGNOSIS — G4733 Obstructive sleep apnea (adult) (pediatric): Secondary | ICD-10-CM | POA: Diagnosis not present

## 2022-10-28 ENCOUNTER — Telehealth: Payer: Self-pay | Admitting: *Deleted

## 2022-10-28 MED ORDER — TAMSULOSIN HCL 0.4 MG PO CAPS: 0.4000 mg | ORAL_CAPSULE | Freq: Every day | ORAL | 0 refills | Status: DC

## 2022-10-28 NOTE — Telephone Encounter (Signed)
Discussed medications with daughter [name]; PCP refilled all meds except Urecholine, Flomax, and Ritalin. Patient has been off urecholine since June without any bladder continence issues. Family inquiring about medications to discontinue.  Refilled Flomax at reduced dose 0.4 mg #30 tabs, will re-address in clinic, may get urinary imaging before Dcing if needed.  Advised can stop ritalin now and observe effect through clinic on Wednesday.  Family also stopped bowel meds, advised that in addition to regular bowel movements to ensure they are smooth as she has had constipation with daily movements in the past and this can contribute to urinary retention.

## 2022-10-28 NOTE — Telephone Encounter (Signed)
Yolande Jolly, Jazel's sister, would like to talk with Dr Shearon Stalls about some of the medication she is taking. Please give her a call. [phone]

## 2022-10-30 NOTE — Progress Notes (Unsigned)
Cardiology Clinic Note   Date: 11/01/2022 ID: [name], DOB [birth year], MRN [id number]  Primary Cardiologist:  Chrystie Nose, MD  Patient Profile    [name] is a 72 y.o. female who presents to the clinic today for hospital follow up.     Past medical history significant for: Takotsubo cardiomyopathy. Occurring in 2009, patient told no CAD (no records available). Hypertension. Hyperlipidemia. CVA. Echo 08/09/2022: EF 60 to 65%.  Mild concentric LVH.  Grade I DD.  Normal RV function.  Moderate LAE.  Aortic valve sclerosis without stenosis.  Negative for interatrial shunt. Carotid duplex 08/09/2022: Right ICA 1 to 39%.  Left ICA 40 to 59%.  Right subclavian artery stenosis. Loop recorder insertion 09/14/2022. OSA. T2DM. CKD stage IIIa. Left breast cancer. Pressure ulcers. Mid sacral wound. First MTP wound left foot. Lateral heel wounds.     History of Present Illness    [name] was first evaluated by Dr. Rennis Golden on 07/19/2017 for palpitations at the request of Clementeen Graham, PA-C.  Patient described palpitations as heart racing.  She had not had any episodes in 5 months prior to visit.  As she was asymptomatic it was recommended she continue to monitor her symptoms and if recurrence would consider monitoring.  She was last seen in the office by Dr. Rennis Golden on 01/23/2020 for routine follow-up.  She was doing well from a cardiac standpoint.  She was recently diagnosed with breast cancer and was felt to be an acceptable risk for surgical procedures.  Patient presented to the ED via EMS from home on 08/08/2022 for mechanical fall hitting her head on the knob of dresser.  No LOC.  Patient's sister reported increasing falls over the last several days.  Sister reported that the day prior she fell off her chair and sat on her bottom for almost 16 hours.  Overnight she fell again and was found facedown on the floor in the morning covered in urine and feces.  Patient reports  legs felt heavy and swollen.  She denies dizziness, speech difficulties, vision changes or unilateral weakness.  Patient's sister reported patient had been noncompliant with medications due to depression.  Head CT showed 16 x 5 mm infarct within the left corona radiata/caudate nucleus new from prior MRI March 2021 but otherwise age-indeterminate.  No acute posttraumatic intracranial findings.  MRI showed multifocal bilateral acute ischemia predominantly affecting the deep gray matter.  Also to cortical foci within the right MCA territory.  Old bilateral MCA territory infarcts.  Echo showed normal LV/RV function with Grade I DD and negative for interatrial shunt.  Carotid duplex showed left ICA 40 to 59%, right ICA 39%.  She was discharged to inpatient rehab on 08/12/2022.  Patient was discharged from rehab on 08/24/2022 with improved activity tolerance, balance, postural control and ability to compensate for deficits.  She was ambulating with a rolling walker.  She was able to complete dressing tasks while seated.  Patient presented to the ED via EMS from home on 09/10/2022 with complaints of right-sided weakness.  Family reported she was last seen normal at 6:30 PM the day prior.  She got up from the dinner table and walked to her reclining chair.  When she got up to go to bed at 10 PM she had new weakness in the right leg and right arm.  She was helped to bed for the night.  In the middle of the night she got up to use the bathroom but  was unable to get to the bedside commode.  Brain MRI shows acute infarcts of the bilateral corona radiata and in the left parietal lobe new when compared to 08/08/2022.  No hemorrhage.  She had a loop recorder implanted on 09/14/2022 by Dr. Lalla Brothers.  She was discharged on 09/14/2022 to inpatient rehab.  She was discharged from rehab on 09/30/2022 with improvement in ambulation and ADLs.  Patient sister contacted the office on 10/14/2022 to report elevated systolic BP readings since being  back home from rehab.  SBP 146, 137, 149, 144, 134, 150, 145, 147.  Today, patient is accompanied by her sister. She is hypotensive with BP 84/52 on intake. Patient reports increased fatigue over the last several days. Patient's sister reports she was instructed by Dr. Shearon Stalls to stop patient's ritalin. She stopped it on Friday. Patient was okay on Saturday but began experiencing decreased energy on Sunday. Prior to this weekend she was more herself. However, her sister states "if it were up to her she would sit in the recliner for 5 hours without getting up." Sister has been getting patient up every 90 minutes to walk around the house. She is also performing exercises from rehab. She recently started with home PT/OT. She continues to have right sided deficits in upper and lower extremity. She also has weakness in left lower extremity from previous spinal surgery. Her speech is slow but clear without aphagia. Orthostatic vitals show a drop of 46 mmHg from laying down to sitting. Standing vitals were unable to be performed. Recheck BP 110/58 seating and 98/58 standing. Patient is lightheaded with position changes. She hydrates throughout the day and is eating well. Upon further discussion she shares that she has had orthostatic hypotension for >7 years so this is not new for her. She has chronic lower extremity edema managed with compression socks. Denies chest pain, pressure tightness. No shortness of breath or DOE.     ROS: All other systems reviewed and are otherwise negative except as noted in History of Present Illness.  Studies Reviewed       EKG is not ordered today.         Physical Exam    VS:  BP (!) 98/58 (BP Location: Right Arm, Patient Position: Standing)   Pulse 68   Ht 5' 9.5" (1.765 m)   Wt 223 lb 12.8 oz (101.5 kg)   SpO2 97% Comment: SPO2 bounced between high 80s and 90s  BMI 32.58 kg/m  , BMI Body mass index is 32.58 kg/m.  Orthostatic VS for the past 24 hrs (Last 3  readings):  BP- Lying Pulse- Lying BP- Sitting Pulse- Sitting  11/01/22 1507 149/64 64 103/62 65     GEN: Well nourished, well developed, in no acute distress. Neck: No JVD or carotid bruits. Cardiac:  RRR. No murmurs. No rubs or gallops.   Respiratory:  Respirations regular and unlabored. Clear to auscultation without rales, wheezing or rhonchi. GI: Soft, nontender, nondistended. Extremities: Radials/DP/PT 2+ and equal bilaterally. No clubbing or cyanosis. Mild-moderate edema bilateral lower extremities with compression socks in place.   Skin: Warm and dry, no rash. Neuro: Strength intact.  Assessment & Plan    Takotsubo cardiomyopathy.  Remote history occurring in 2009 (no records available). Patient denies chest pain, pressure, tightness. She is in aspirin and Plavix for stroke.  Hypertension/orthostatic hypotension/fatigue: BP today 89/52 on intake and 110/58 on recheck.  Orthostatic vitals showed a drop of 46 mmHg from laying to sitting with some associated lightheadedness.  Patient reports a +7 year history of orthostatic hypotension so this is not necessarily new for her. Home SBP has been running from 113-140s. She has not reported lightheadedness with position changes since discharge from inpatient rehab. Discussed permissive hypertension. Will decrease amlodipine to 5 mg daily. She will continue carvedilol and losartan for now. She is instructed to make slow position changes and continue to use walker.  CVA.  Patient suffered from CVA x 2 in May 2024 and June 2024.  She underwent inpatient rehab both times.  Patient continues to have right upper and lower extremity deficits. Speech is slow but clear without aphasia. She has weakness in left lower extremity from prior spinal surgery. Continue aspirin, Plavix, atorvastatin. Carotid artery disease.  Carotid duplex May 2024 showed right ICA 39%, left ICA 40 to 59%. Patient denies presyncope or syncope. She has lightheadedness with position  changes today.   Disposition: Decrease amlodipine to 5 mg daily. Return in 6-8 weeks or sooner as needed. Will also have patient return in 5 months to see Dr. Rennis Golden. Deferred lab draw today given patient's fatigue and orthostasis. She has several visits with other specialities coming up. If labs are not drawn by the time she returns will get labs at that time.          Signed, Etta Grandchild. , DNP, NP-C

## 2022-10-31 DIAGNOSIS — I129 Hypertensive chronic kidney disease with stage 1 through stage 4 chronic kidney disease, or unspecified chronic kidney disease: Secondary | ICD-10-CM | POA: Diagnosis not present

## 2022-10-31 DIAGNOSIS — E669 Obesity, unspecified: Secondary | ICD-10-CM | POA: Diagnosis not present

## 2022-10-31 DIAGNOSIS — Z6833 Body mass index (BMI) 33.0-33.9, adult: Secondary | ICD-10-CM | POA: Diagnosis not present

## 2022-10-31 DIAGNOSIS — E1121 Type 2 diabetes mellitus with diabetic nephropathy: Secondary | ICD-10-CM | POA: Diagnosis not present

## 2022-11-01 ENCOUNTER — Ambulatory Visit: Payer: PPO | Admitting: Student

## 2022-11-01 ENCOUNTER — Encounter: Payer: Self-pay | Admitting: Student

## 2022-11-01 VITALS — BP 98/58 | HR 68 | Ht 69.5 in | Wt 223.8 lb

## 2022-11-01 DIAGNOSIS — I951 Orthostatic hypotension: Secondary | ICD-10-CM

## 2022-11-01 DIAGNOSIS — I6523 Occlusion and stenosis of bilateral carotid arteries: Secondary | ICD-10-CM | POA: Diagnosis not present

## 2022-11-01 DIAGNOSIS — R5383 Other fatigue: Secondary | ICD-10-CM

## 2022-11-01 DIAGNOSIS — R531 Weakness: Secondary | ICD-10-CM | POA: Diagnosis not present

## 2022-11-01 DIAGNOSIS — Z8673 Personal history of transient ischemic attack (TIA), and cerebral infarction without residual deficits: Secondary | ICD-10-CM

## 2022-11-01 DIAGNOSIS — I5181 Takotsubo syndrome: Secondary | ICD-10-CM | POA: Diagnosis not present

## 2022-11-01 NOTE — Patient Instructions (Addendum)
Medication Instructions:  Decrease the Amlodipine from 10mg  to 5mg  tablet . Take one tablet daily.   *If you need a refill on your cardiac medications before your next appointment, please call your pharmacy*   Lab Work: None ordered If you have labs (blood work) drawn today and your tests are completely normal, you will receive your results only by: MyChart Message (if you have MyChart) OR A paper copy in the mail If you have any lab test that is abnormal or we need to change your treatment, we will call you to review the results.   Testing/Procedures: None ordered   Follow-Up: At [facility], you and your health needs are our priority.  As part of our continuing mission to provide you with exceptional heart care, we have created designated Provider Care Teams.  These Care Teams include your primary Cardiologist (physician) and Advanced Practice Providers (APPs -  Physician Assistants and Nurse Practitioners) who all work together to provide you with the care you need, when you need it.  We recommend signing up for the patient portal called "MyChart".  Sign up information is provided on this After Visit Summary.  MyChart is used to connect with patients for Virtual Visits (Telemedicine).  Patients are able to view lab/test results, encounter notes, upcoming appointments, etc.  Non-urgent messages can be sent to your provider as well.   To learn more about what you can do with MyChart, go to [email or web address].    Your next appointment:   6-8 weeks with Carlos Levering, NP  5 month follow up Dr. Rennis Golden month(s)  Provider:   Chrystie Nose, MD

## 2022-11-02 ENCOUNTER — Encounter: Payer: Self-pay | Admitting: Physical Medicine and Rehabilitation

## 2022-11-02 ENCOUNTER — Encounter: Payer: PPO | Attending: Physical Medicine and Rehabilitation | Admitting: Physical Medicine and Rehabilitation

## 2022-11-02 VITALS — BP 109/72 | HR 60 | Ht 69.5 in | Wt 224.0 lb

## 2022-11-02 DIAGNOSIS — R339 Retention of urine, unspecified: Secondary | ICD-10-CM | POA: Insufficient documentation

## 2022-11-02 DIAGNOSIS — I69398 Other sequelae of cerebral infarction: Secondary | ICD-10-CM | POA: Diagnosis not present

## 2022-11-02 DIAGNOSIS — Z8673 Personal history of transient ischemic attack (TIA), and cerebral infarction without residual deficits: Secondary | ICD-10-CM | POA: Insufficient documentation

## 2022-11-02 DIAGNOSIS — R5383 Other fatigue: Secondary | ICD-10-CM | POA: Insufficient documentation

## 2022-11-02 DIAGNOSIS — Z7409 Other reduced mobility: Secondary | ICD-10-CM | POA: Insufficient documentation

## 2022-11-02 DIAGNOSIS — F331 Major depressive disorder, recurrent, moderate: Secondary | ICD-10-CM | POA: Diagnosis not present

## 2022-11-02 DIAGNOSIS — L97522 Non-pressure chronic ulcer of other part of left foot with fat layer exposed: Secondary | ICD-10-CM | POA: Diagnosis not present

## 2022-11-02 MED ORDER — TAMSULOSIN HCL 0.4 MG PO CAPS: 0.8000 mg | ORAL_CAPSULE | Freq: Every day | ORAL | 2 refills | Status: DC

## 2022-11-02 MED ORDER — BUPROPION HCL ER (XL) 150 MG PO TB24: 150.0000 mg | ORAL_TABLET | Freq: Every day | ORAL | 3 refills | Status: DC

## 2022-11-02 NOTE — Patient Instructions (Signed)
History of CVA (cerebrovascular accident) Follow-up with neurology as scheduled Ensure that cardiology is aware that you are on Plavix, not on aspirin currently  Follow-up with me in 2 months  Fatigue as sequela of cerebrovascular accident (CVA) Major depressive disorder, recurrent episode, moderate (HCC) Clinical neuropsychologist consult placed to Dr. Theora Master on Lexapro 20 mg daily, resume Wellbutrin at half dose 150 mg daily.  Call clinic if any side effects, will need to take for at least 6 to 8 weeks before knowing affect. Stop Ritalin  Skin ulcer of left foot with fat layer exposed Is getting daily dressing changes, follow-up with wound care as scheduled   Urinary retention Has appointment with urology Would resume Flomax at 0.8 mg daily, given worsening retention with reduction to 0.4 mg daily.  [facility].  Will get urinalysis with reflex to culture today to ensure no UTI   Mobility impaired Provided handicap placard today  Continue working with PT, OT.  Recommend discussing with PT whether to switch back to rollator walker, so that you can use hand brakes and chair.

## 2022-11-02 NOTE — Progress Notes (Signed)
Subjective:    [name], female    DOB: [birth year], 72 y.o.   MRN: [id number]  HPI   [name] is a 72 y.o. year old female  who  has a past medical history of AKI (acute kidney injury) (HCC) (08/09/2022), Breast cancer (HCC) (12/2019), CKD (chronic kidney disease) (06/07/2016), Diabetic polyneuropathy (06/07/2016), Dyslipidemia, Endometrial adenocarcinoma (2012), Fever blister, Hyperlipidemia, Hypertension, essential (06/07/2016), Major depressive disorder, Mixed dyslipidemia (06/07/2016), MRSA infection (10/2019), Obstructive sleep apnea (06/07/2016), Peripheral neuropathy, Retinopathy, Right rib fracture (08/09/2022), Severe obesity, Skin ulcer of left foot with fat layer exposed (11/02/2016), Stroke, Tachycardia (07/19/2017), Traumatic rhabdomyolysis (HCC) (08/09/2022), Type 2 diabetes mellitus with hyperglycemia, with long-term current use of insulin (06/07/2016), Unilateral primary osteoarthritis, right knee (04/16/2019), and UTI (urinary tract infection) (08/09/2022).   They are presenting to PM&R clinic for follow up related to IPR stay for L BG stroke 6/26-7/12/24. From DC summary:   Brief HPI:   [name] is a 72 y.o. right-handed female with history of ductal carcinoma in situ of left breast status post lumpectomy with radioactive seed implant 2021, CKD with creatinine 1.5-1.84, diabetes mellitus hypertension, obesity with BMI 33.52, hyperlipidemia as well as history of right middle cerebral artery infarction maintained on aspirin and Plavix receiving inpatient rehab services 08/12/2022 - 08/25/2022 discharge to home ambulating 150 feet.  Discharge plan is to home with her sister with assistance as needed.  Presented 09/10/2023 for acute onset of right-sided weakness.  MRI showed acute infarct in the bilateral corona radiata and in the left parietal lobe without hemorrhage.  MRA showed no intracranial large vessel occlusion or significant stenosis.  CT angiogram of  head and neck with no emergent vessel vascular findings.  Most recent echocardiogram with ejection fraction of 60 to 65% no wall motion abnormalities grade 1 diastolic dysfunction.  Neurology consulted maintained on aspirin as well as Plavix x 3 weeks then Plavix alone.  Lovenox for DVT prophylaxis.  Plan for loop recorder placement.  Hospital course follow-up podiatry services for chronic diabetic foot ulcers bilaterally with wound care as directed.  Patient reports having some right shoulder pain due to overuse this improved with Voltaren gel.  Tolerating a regular diet.  Therapy evaluations completed due to patient decreased functional mobility right side weakness was admitted for a comprehensive rehab program.     Hospital Course: [name] was admitted to rehab 09/14/2022 for inpatient therapies to consist of PT, ST and OT at least three hours five days a week. Past admission physiatrist, therapy team and rehab RN have worked together to provide customized collaborative inpatient rehab.  Pertaining to patient's left basal ganglia and left parietal small punctate infarct as well as history of right MCA infarction remained stable she will continue on aspirin and Plavix x 3 weeks then Plavix alone.  Patient did undergo a loop recorder placement.  Pain management with shoulder discomfort maintained on Voltaren gel as well as heating pad.  Mood stabilization with Wellbutrin as well as Lexapro and neuropsych follow-up.  Ritalin was added to help initiate focus and attention to tasks.  Skin care follow-up for wound care nurse for bilateral diabetic foot ulcers followed by podiatry services wound care as directed.  Patient was maintained on zinc sulfate to help with wound healing.  Blood sugars monitored hemoglobin A1c 7.0 insulin therapy as directed.  Blood pressure controlled and monitored on Cozaar/Norvasc's as well as Coreg and would need outpatient follow-up.  Lovenox ongoing during hospital  course for DVT  prophylaxis no bleeding episodes.  CKD stage III creatinine baseline 1.7-1.84 with follow-up chemistries with latest creatinine 1.41 and renal ultrasound unremarkable.  History of ductal carcinoma in situ of left breast status post lumpectomy and radioactive seed implant 2021.  Follow-up outpatient Dr. Pamelia Hoit and continue Nolvadex.  Lipitor ongoing for hyperlipidemia.  Bouts of constipation resolved with laxative assistance.  Hospital course urgency E. coli UTI completed course of antibiotic therapy.  Bouts of urge and stress incontinence initially on Ditropan transition to Myrbetriq and monitored then developed retention requiring catherization felt to be possibly due to wellbutrin  serotonergic side effects which was decreased and stopped and flomax added titrated to 0.8  mg as well as Urecholine and latest bladder scans much improved.  Bouts of constipation KUB showed stool burden respond well to 2 enema.  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  She would need outpatient sleep study.     Blood pressures were monitored on TID basis and controlled   Diabetes has been monitored with ac/hs CBG checks and SSI was use prn for tighter BS control.      Rehab course: During patient's stay in rehab weekly team conferences were held to monitor patient's progress, set goals and discuss barriers to discharge. At admission, patient required minimal assist 120 feet rolling walker moderate assist sit to stand    Interval Hx:  - Therapies: Patient states getting OOB is a "chore", get OOB once daily but is taking frequent naps. She has been doing home PT, OT home health just had assessments recently. Has been compliant with HEP from rehab.    - Follow ups: Saw her PCP, her Oncologist, and cardiology  Going to foot doctor for her wounds tomorrow.  Sees Neurology August 28th.  Urology September 11th.    - Falls: 2 falls, once at the mailbox while she was  trying to go out to the front porch by herself; walker rolled and she fell forward onto her knees. Also fell in her bedroom getting up to pee at nighttime falling into the laundry basket. Family has been getting her up both times.    - DME: Using walker for ambulation, has a rollator at home but "therapy said it was too fast for me". Still using BSC due to urinary frequency.    - Medications: Recently taken off tamoxifen d/t clotting risk.    - Other concerns: Stopped Ritalin over the weekend, states she was OK on Saturday but Sunday was "awful" and since then has been very fatigued, leaning forward; Denies fevers, chills, nausea, vomitting. Has been having some trouble urinating as well yesterday. She has been peeing at nighttime 4x, no burning or urgency. Did just reduce flomax to 0.4 mg form 0.8 mg as well. Has not been refilled by pharmacy.   Wounds on R heel has recurred; has started floating heels again. Getting daily cleaning, dressing changes with daughter. No drainage from any wounds.   Pain Inventory Average Pain 0 Pain Right Now 0 My pain is  no pain  LOCATION OF PAIN  no pain  BOWEL Number of stools per week: 3 Oral laxative use Yes  Type of laxative Senakot, Enema or suppository use No  History of colostomy No  Incontinent No   BLADDER Normal and Pads In and out cath, frequency n/a Able to self cath  n/a Bladder incontinence No  Frequent urination No  Leakage with coughing No  Difficulty starting stream No  Incomplete bladder emptying No    Mobility walk with assistance use a walker how many minutes can you walk? 2-3 ability to climb steps?  yes do you drive?  no Do you have any goals in this area?  yes  Function retired I need assistance with the following:  meal prep, household duties, and shopping  Neuro/Psych weakness numbness trouble walking confusion depression  Prior Studies no  Physicians involved in your care Hospital  follow-up   Family History  Problem Relation Age of Onset   Hypertension Mother    Alzheimer's disease Mother    Heart attack Father    CAD Father    Alzheimer's disease Father    Bladder Cancer Father        dx late 25s   Diverticulitis Sister    Obesity Sister    Hypertension Sister    Voice disorder Brother    Breast cancer Paternal Aunt 3   Prostate cancer Paternal Uncle        dx mid 44s   Social History   Socioeconomic History   Marital status: Single    Spouse name: Not on file   Number of children: Not on file   Years of education: 16   Highest education level: Bachelor's degree (e.g., BA, AB, BS)  Occupational History   Occupation: Retired    Comment: Runner, broadcasting/film/video  Tobacco Use   Smoking status: Never   Smokeless tobacco: Never  Vaping Use   Vaping status: Never Used  Substance and Sexual Activity   Alcohol use: No   Drug use: No   Sexual activity: Not Currently    Birth control/protection: Surgical  Other Topics Concern   Not on file  Social History Narrative   Right Handed   Lives in one story home   Does not drink caffeine   Social Determinants of Health   Financial Resource Strain: Not on file  Food Insecurity: Not on file  Transportation Needs: Not on file  Physical Activity: Not on file  Stress: Not on file  Social Connections: Unknown (10/18/2021)   Received from [facility]   Social Network    Social Network: Not on file   Past Surgical History:  Procedure Laterality Date   ADRENALECTOMY     BREAST LUMPECTOMY WITH RADIOACTIVE SEED LOCALIZATION Left 02/25/2020   Procedure: LEFT BREAST LUMPECTOMY WITH RADIOACTIVE SEED LOCALIZATION;  Surgeon: Abigail Miyamoto, MD;  Location: [facility];  Service: General;  Laterality: Left;   BUNIONECTOMY     CARDIAC CATHETERIZATION     CERVICAL SPINE SURGERY     CHOLECYSTECTOMY     DILATION AND CURETTAGE OF UTERUS     LAPAROSCOPIC SALPINGOOPHERECTOMY     LOOP RECORDER  INSERTION N/A 09/14/2022   Procedure: LOOP RECORDER INSERTION;  Surgeon: Lanier Prude, MD;  Location: [facility];  Service: Cardiovascular;  Laterality: N/A;   THORACOTOMY     TONSILLECTOMY     Past Medical History:  Diagnosis Date   AKI (acute kidney injury) (HCC) 08/09/2022   Breast cancer (HCC) 12/2019   left breast DCIS   CKD (chronic kidney disease) 06/07/2016   Stage 2, GFR 60-89 ml/min   Diabetic polyneuropathy 06/07/2016   Dyslipidemia    Endometrial adenocarcinoma 2012   Fever blister    Hyperlipidemia    Hypertension, essential 06/07/2016   Major depressive disorder    Mixed dyslipidemia 06/07/2016   MRSA infection  10/2019   left foot wound   Obstructive sleep apnea 06/07/2016   does not use CPAP   Peripheral neuropathy    Retinopathy    Right rib fracture 08/09/2022   Severe obesity    Skin ulcer of left foot with fat layer exposed 11/02/2016   Stroke    Asymptomatic, discovered via neuroimaging; small infarct in left parietal lobe; also concern for small b/l frontal infarcts   Tachycardia 07/19/2017   Traumatic rhabdomyolysis (HCC) 08/09/2022   Type 2 diabetes mellitus with hyperglycemia, with long-term current use of insulin 06/07/2016   Unilateral primary osteoarthritis, right knee 04/16/2019   UTI (urinary tract infection) 08/09/2022   BP 109/72   Pulse 60   Ht 5' 9.5" (1.765 m)   Wt 224 lb (101.6 kg)   SpO2 100%   BMI 32.60 kg/m   Opioid Risk Score:   Fall Risk Score:  `1  Depression screen [facility] 2/9     11/02/2022    9:50 AM  Depression screen PHQ 2/9  Decreased Interest 3  Down, Depressed, Hopeless 3  PHQ - 2 Score 6  Altered sleeping 3  Tired, decreased energy 3  Change in appetite 0  Feeling bad or failure about yourself  0  Trouble concentrating 3  Moving slowly or fidgety/restless 3  Suicidal thoughts 0  PHQ-9 Score 18     Review of Systems  Constitutional:  Positive for unexpected weight change.  Endocrine:        High bs low bs  All other systems reviewed and are negative.      Objective:   Physical Exam   PE: Constitution: Appropriate appearance for age. No apparent distress  +Obese Resp: No respiratory distress. No accessory muscle usage. on RA and CTAB Cardio: Well perfused appearance. 3+ RLE, 2+ LLE edema.  Abdomen: Nondistended. Nontender.   Psych: Flat, slowed speech, depressed mood/  Neuro: AAOx3. +Delay. +memory deficit  Neurologic Exam:   DTRs: Reflexes were 2+ in bilateral achilles, patella, biceps, BR and triceps. Babinsky: flexor responses b/l.   Hoffmans: negative b/l Sensory exam: revealed normal sensation in all dermatomal regions in bilateral upper extremities, bilateral lower extremities, and with reduced sensation to light touch in bilateral feet Motor exam: strength 4/5 throughout bilateral upper extremities and bilateral lower extremities Coordination: Fine motor coordination was normal.   Gait:  Slowed, forward bending, with RW       Assessment & Plan:   NINON MAROLF is a 72 y.o. year old female  who  has a past medical history of AKI (acute kidney injury) (HCC) (08/09/2022), Breast cancer (HCC) (12/2019), CKD (chronic kidney disease) (06/07/2016), Diabetic polyneuropathy (06/07/2016), Dyslipidemia, Endometrial adenocarcinoma (2012), Fever blister, Hyperlipidemia, Hypertension, essential (06/07/2016), Major depressive disorder, Mixed dyslipidemia (06/07/2016), MRSA infection (10/2019), Obstructive sleep apnea (06/07/2016), Peripheral neuropathy, Retinopathy, Right rib fracture (08/09/2022), Severe obesity, Skin ulcer of left foot with fat layer exposed (11/02/2016), Stroke, Tachycardia (07/19/2017), Traumatic rhabdomyolysis (HCC) (08/09/2022), Type 2 diabetes mellitus with hyperglycemia, with long-term current use of insulin (06/07/2016), Unilateral primary osteoarthritis, right knee (04/16/2019), and UTI (urinary tract infection) (08/09/2022).   They are presenting to  PM&R clinic for follow up related to IPR stay for L BG stroke 6/26-7/12/24.  History of CVA (cerebrovascular accident) Follow-up with neurology as scheduled Ensure that cardiology is aware that you are on Plavix, not on aspirin currently  Follow-up with me in 2 months  Fatigue as sequela of cerebrovascular accident (CVA) Major depressive disorder, recurrent episode, moderate (HCC) Clinical  neuropsychologist consult placed to Dr. Theora Master on Lexapro 20 mg daily, resume Wellbutrin at half dose 150 mg daily.  Call clinic if any side effects, will need to take for at least 6 to 8 weeks before knowing affect. Stop Ritalin  Skin ulcer of left foot with fat layer exposed Is getting daily dressing changes, follow-up with wound care as scheduled   Urinary retention Has appointment with urology Would resume Flomax at 0.8 mg daily, given worsening retention with reduction to 0.4 mg daily.  [facility].  Will get urinalysis with reflex to culture today to ensure no UTI   Mobility impaired Provided handicap placard today  Continue working with PT, OT.  Recommend discussing with PT whether to switch back to rollator walker, so that you can use hand brakes and chair.

## 2022-11-03 ENCOUNTER — Ambulatory Visit: Payer: PPO | Admitting: Podiatry

## 2022-11-03 ENCOUNTER — Other Ambulatory Visit: Payer: Self-pay | Admitting: Physical Medicine and Rehabilitation

## 2022-11-03 DIAGNOSIS — B351 Tinea unguium: Secondary | ICD-10-CM | POA: Diagnosis not present

## 2022-11-03 DIAGNOSIS — M79675 Pain in left toe(s): Secondary | ICD-10-CM | POA: Diagnosis not present

## 2022-11-03 DIAGNOSIS — M79674 Pain in right toe(s): Secondary | ICD-10-CM | POA: Diagnosis not present

## 2022-11-03 DIAGNOSIS — Z7901 Long term (current) use of anticoagulants: Secondary | ICD-10-CM | POA: Diagnosis not present

## 2022-11-03 DIAGNOSIS — E1142 Type 2 diabetes mellitus with diabetic polyneuropathy: Secondary | ICD-10-CM

## 2022-11-03 DIAGNOSIS — L84 Corns and callosities: Secondary | ICD-10-CM | POA: Diagnosis not present

## 2022-11-03 DIAGNOSIS — L97522 Non-pressure chronic ulcer of other part of left foot with fat layer exposed: Secondary | ICD-10-CM

## 2022-11-03 NOTE — Progress Notes (Signed)
Subjective: Chief Complaint  Patient presents with   Foot Ulcer    Nail trim    72 year old female presents the office today with a family member, sister for the above concerns.  Since I saw her last she has had a stroke and she is currently breathing with her sister.  She presents today for routine care.  The wound on the big toe has been doing better and her sister changes the bandage daily.  She does have pressure sores which they keep bandage on.  She is on Plavix.   Objective: AAO x3, NAD DP/PT pulses palpable bilaterally, CRT less than 3 seconds Protective sensation absent with Semmes Weinstein monofilament. Remaining nails are hypertrophic, dystrophic nail discoloration.  Nails affected are 1, 2, 4 on the left and 2 through 5 on the right.  No edema, erythema. On the medial aspect along the bunion area in the left foot.  The ulceration is making much better.  There is hyperkeratotic tissue.  Upon debridement of this medication the area is almost completely healed.  There is no surrounding erythema, ascending cellulitis.  There is no fluctuation or crepitation.  No moderate.  She does get pressure spots along the third metatarsal base as well as left heel.  No skin breakdown but there is erythema which is blanchable. No pain with calf compression, swelling, warmth, erythema  Assessment: 72 year old female symptomatic onychomycosis, ulceration left foot  Plan: -All treatment options discussed with the patient including all alternatives, risks, complications.  -Sharply debrided the nails x 7 without any complications or bleeding. -Check debrided hyperkeratotic lesion left foot elevated previous ulceration.  Upon debridement there was 1 small superficial.  I think this is coming from where the callus became thicker but overall she is doing much better.  I asked her sister change the pain is daily and is almost completely healed follow-up with her in 2 months but there is any worsening or  changes of forgot to let me know.  Open still present but overall doing better.  There is no surrounding erythema.  20 signs or symptoms of infection.  Continue with daily dressing changes, offloading. -Continue offloading for the preulcerative lesions and monitor for any further skin breakdown. -Patient encouraged to call the office with any questions, concerns, change in symptoms.   Return in about 2 months (around 01/04/2023) for routine care.  Vivi Barrack DPM

## 2022-11-04 LAB — URINALYSIS, ROUTINE W REFLEX MICROSCOPIC

## 2022-11-07 ENCOUNTER — Encounter: Payer: Self-pay | Admitting: Physical Medicine and Rehabilitation

## 2022-11-15 DIAGNOSIS — N390 Urinary tract infection, site not specified: Secondary | ICD-10-CM | POA: Diagnosis not present

## 2022-11-16 ENCOUNTER — Encounter: Payer: Self-pay | Admitting: Neurology

## 2022-11-16 ENCOUNTER — Ambulatory Visit: Payer: PPO | Admitting: Neurology

## 2022-11-16 VITALS — BP 143/67 | HR 63 | Ht 69.5 in | Wt 224.6 lb

## 2022-11-16 DIAGNOSIS — I639 Cerebral infarction, unspecified: Secondary | ICD-10-CM

## 2022-11-16 DIAGNOSIS — R399 Unspecified symptoms and signs involving the genitourinary system: Secondary | ICD-10-CM | POA: Diagnosis not present

## 2022-11-16 NOTE — Progress Notes (Signed)
NEUROLOGY CONSULTATION NOTE  [name] MRN: [id number] DOB: February 29, 1951  Referring provider: Dr. Laurann Montana Primary care provider: Dr. Laurann Montana  Reason for consult:  stroke   Thank you for your kind referral of [name] for consultation of the above symptoms. Although her history is well known to you, please allow me to reiterate it for the purpose of our medical record. The patient was accompanied to the clinic by her sister [name] who also provides collateral information. Records and images were personally reviewed where available.   HISTORY OF PRESENT ILLNESS: This is a pleasant 72 year old right-handed woman with a history of hypertension, hyperlipidemia, Takotsubo cardiomyopathy, diabetes, sleep apnea, neuropathy, breast cancer, endometrial adenocarcinoma, prior stroke, presenting for evaluation after hospitalization for recurrent stroke on 08/09/22 and 09/10/22. She was previously seen in our office in 2021 for memory loss. Her sister [name] is present to provide additional information. Records were reviewed. She presented to the ER on 08/09/22 with progressive weakness and falls for 3 days, significant bilateral lower extremity edema, on the ground for 10 hours. BP 198/72, NIHSS 0. I personally reviewed brain MRI showing multifocal bilateral acute infarcts, predominantly affecting the deep gray matter, as well as 2 cortical foci within the right MCA territory. There were old bilateral MCA territory infarcts. MRA normal, carotid doppler showed left 40-59% ICA stenosis. Echocardiogram showed EF 60-65%, mild concentric LVH, grade 1 diastolic dysfunction, moderately dilated left atria, negative bubble study. LDL 58, HbA1c 7.0. She was discharged home on DAPT x 3 weeks then aspirin with plan for 30-day holter. She was discharged to inpatient rehab then home to live with [name] on 08/25/22 with residual slightly slurred speech, word-finding difficulties and chronic left leg weakness.  On 09/09/22, she started having right arm and leg weakness with low BP (95s/40s), weakness worsened overnight, EMS was called early morning 6/22 with NIHSS 7 in the ER. Brain MRI showed new acute infarcts in the bilateral corona radiata and left parietal lobe. She was again in inpatient rehab until 7/12 discharged on DAPT x 3 weeks then Plavix alone. She had a loop monitor placed prior to hospital discharge.  She has been staying with [name] since then, they report that after the second stroke, left leg weakness was worse than baseline after 2007 thoracotomy. She has a hard time grasping things with her right hand, her handwriting has changed. No focal paresthesias, she reports numbness below the knees from neuropathy, no pain. She denies any headaches. She felt dizzy getting out of bed today. She loves to sing but notes her voice is "gone from the stroke," no difficulty swallowing. [name] has been managing her medications but she hopes to go back home where she was living alone prior to the strokes. She sleeps well at night but feels tired, very weak, just wanting to sleep in the daytime. [name] has been trying to encourage her to exercise regularly and stay with their routine and this leads to conflict. Prior to the strokes, she had a lot of depression, sleeping all day. She sees [facility] for medication management. She had been doing home PT and it was felt she could benefit from more but that she should also be doing the home exercises which has been the challenge. They went to the ER last night due to foul smelling urine, she has not started antibiotic prescribed. Last fall was a couple of weeks ago.    PAST MEDICAL HISTORY: Past Medical History:  Diagnosis Date  AKI (acute kidney injury) (HCC) 08/09/2022   Breast cancer (HCC) 12/2019   left breast DCIS   CKD (chronic kidney disease) 06/07/2016   Stage 2, GFR 60-89 ml/min   Diabetic polyneuropathy 06/07/2016   Dyslipidemia    Endometrial  adenocarcinoma 2012   Fever blister    Hyperlipidemia    Hypertension, essential 06/07/2016   Major depressive disorder    Mixed dyslipidemia 06/07/2016   MRSA infection 10/2019   left foot wound   Obstructive sleep apnea 06/07/2016   does not use CPAP   Peripheral neuropathy    Retinopathy    Right rib fracture 08/09/2022   Severe obesity    Skin ulcer of left foot with fat layer exposed 11/02/2016   Stroke    Asymptomatic, discovered via neuroimaging; small infarct in left parietal lobe; also concern for small b/l frontal infarcts   Tachycardia 07/19/2017   Traumatic rhabdomyolysis (HCC) 08/09/2022   Type 2 diabetes mellitus with hyperglycemia, with long-term current use of insulin 06/07/2016   Unilateral primary osteoarthritis, right knee 04/16/2019   UTI (urinary tract infection) 08/09/2022    PAST SURGICAL HISTORY: Past Surgical History:  Procedure Laterality Date   ADRENALECTOMY     BREAST LUMPECTOMY WITH RADIOACTIVE SEED LOCALIZATION Left 02/25/2020   Procedure: LEFT BREAST LUMPECTOMY WITH RADIOACTIVE SEED LOCALIZATION;  Surgeon: Abigail Miyamoto, MD;  Location: [facility];  Service: General;  Laterality: Left;   BUNIONECTOMY     CARDIAC CATHETERIZATION     CERVICAL SPINE SURGERY     CHOLECYSTECTOMY     DILATION AND CURETTAGE OF UTERUS     LAPAROSCOPIC SALPINGOOPHERECTOMY     LOOP RECORDER INSERTION N/A 09/14/2022   Procedure: LOOP RECORDER INSERTION;  Surgeon: Lanier Prude, MD;  Location: [facility];  Service: Cardiovascular;  Laterality: N/A;   THORACOTOMY     TONSILLECTOMY      MEDICATIONS: Current Outpatient Medications on File Prior to Visit  Medication Sig Dispense Refill   acetaminophen (TYLENOL) 325 MG tablet Take 2 tablets (650 mg total) by mouth every 4 (four) hours as needed for mild pain (or temp > 37.5 C (99.5 F)).     amLODipine (NORVASC) 5 MG tablet Take 5 mg by mouth daily.     ascorbic acid (VITAMIN C) 500 MG tablet  Take 1 tablet (500 mg total) by mouth daily. 100 tablet 0   atorvastatin (LIPITOR) 80 MG tablet Take 1 tablet (80 mg total) by mouth daily. 30 tablet 0   buPROPion (WELLBUTRIN XL) 150 MG 24 hr tablet Take 1 tablet (150 mg total) by mouth daily. 30 tablet 3   carvedilol (COREG) 3.125 MG tablet Take 1 tablet (3.125 mg total) by mouth 2 (two) times daily. 60 tablet 0   Cholecalciferol 125 MCG (5000 UT) TABS Take 1 tablet (5,000 Units total) by mouth every evening for 30 days then as directed by MD 100 tablet 0   clopidogrel (PLAVIX) 75 MG tablet Take 1 tablet (75 mg total) by mouth daily. 30 tablet 0   Continuous Glucose Sensor (FREESTYLE LIBRE 2 SENSOR) MISC Apply new sensor every 14 days to monitor blood glucose. Remove old sensor before applying new one.     diclofenac Sodium (VOLTAREN) 1 % GEL Apply 4 g topically 4 (four) times daily. 400 g 0   escitalopram (LEXAPRO) 20 MG tablet Take 1 tablet (20 mg total) by mouth daily. 30 tablet 0   insulin aspart protamine- aspart (NOVOLOG MIX 70/30) (70-30) 100 UNIT/ML injection  Inject 0.25 mLs (25 Units total) into the skin 2 (two) times daily with a meal. 10 mL 11   losartan (COZAAR) 25 MG tablet Take 1 tablet (25 mg total) by mouth daily. 30 tablet 0   metFORMIN (GLUCOPHAGE) 500 MG tablet Take 1 tablet (500 mg total) by mouth 2 (two) times daily with a meal. 60 tablet 0   tamsulosin (FLOMAX) 0.4 MG CAPS capsule Take 2 capsules (0.8 mg total) by mouth daily after supper. 60 capsule 2   Zinc Sulfate 220 (50 Zn) MG TABS Take 1 tablet (220 mg total) by mouth daily for 30 days then as directed by MD 100 tablet 0   No current facility-administered medications on file prior to visit.    ALLERGIES: Allergies  Allergen Reactions   Elavil [Amitriptyline] Other (See Comments)    Unknown reaction   Pristiq [Desvenlafaxine] Other (See Comments)    Unknown allergy   Cipro [Ciprofloxacin Hcl] Rash   Penicillins Rash   Sulfa Antibiotics Rash   Sulfamethoxazole  Rash   Sulfur Rash    FAMILY HISTORY: Family History  Problem Relation Age of Onset   Hypertension Mother    Alzheimer's disease Mother    Heart attack Father    CAD Father    Alzheimer's disease Father    Bladder Cancer Father        dx late 75s   Diverticulitis Sister    Obesity Sister    Hypertension Sister    Voice disorder Brother    Breast cancer Paternal Aunt 33   Prostate cancer Paternal Uncle        dx mid 75s    SOCIAL HISTORY: Social History   Socioeconomic History   Marital status: Single    Spouse name: Not on file   Number of children: Not on file   Years of education: 16   Highest education level: Bachelor's degree (e.g., BA, AB, BS)  Occupational History   Occupation: Retired    Comment: Runner, broadcasting/film/video  Tobacco Use   Smoking status: Never   Smokeless tobacco: Never  Vaping Use   Vaping status: Never Used  Substance and Sexual Activity   Alcohol use: No   Drug use: No   Sexual activity: Not Currently    Birth control/protection: Surgical  Other Topics Concern   Not on file  Social History Narrative   Right Handed   Lives in one story home   Does not drink caffeine   Social Determinants of Health   Financial Resource Strain: Not on file  Food Insecurity: Not on file  Transportation Needs: Not on file  Physical Activity: Not on file  Stress: Not on file  Social Connections: Unknown (10/18/2021)   Received [facility]   Social Network    Social Network: Not on file  Intimate Partner Violence: Unknown (10/18/2021)   Received from [facility]   HITS    Physically Hurt: Not on file    Insult or Talk Down To: Not on file    Threaten Physical Harm: Not on file    Scream or Curse: Not on file     PHYSICAL EXAM: Vitals:   11/16/22 1257  BP: (!) 143/67  Pulse: 63  SpO2: 98%   General: No acute distress, flat affect, slowed responses but appropriate Head:  Normocephalic/atraumatic Skin/Extremities: No  rash, no edema Neurological Exam: Mental status: alert and awake, no dysarthria or aphasia, Fund of knowledge is appropriate.  Attention and concentration are  normal.     Cranial nerves: CN I: not tested CN II: pupils equal, round, visual fields intact CN III, IV, VI:  full range of motion, no nystagmus, no ptosis CN V: facial sensation intact CN VII: upper and lower face symmetric CN VIII: hearing intact to conversation CN XI: sternocleidomastoid and trapezius muscles intact CN XII: tongue midline Bulk & Tone: normal, no fasciculations. Motor: 5/5 on both UE and right LE, left hip flexion 3-5/, 5/5 distally.  Sensation: intact to light touch, cold, pin, vibration sense.  No extinction to double simultaneous stimulation.  Romberg test negative Deep Tendon Reflexes: +2 throughout Cerebellar: no incoordination on finger to nose testing Gait: slow and cautious, no ataxia.  Tremor: none   IMPRESSION: This is a pleasant 72 year old right-handed woman with a history of hypertension, hyperlipidemia, Takotsubo cardiomyopathy, diabetes, sleep apnea, neuropathy, breast cancer, endometrial adenocarcinoma, prior stroke, presenting for evaluation after hospitalization for recurrent strokes on 08/09/22 and 09/10/22 in multiple vascular distributions, concerning for cardioembolic source. Exam shows left leg weakness, she also notes right hand weakness/change in handwriting. She now has a loop recorder and continues on Plavix for secondary stroke prevention. Continue control of vascular risk factors. Her sister reports poor motivation to do home exercise program, I discussed how post-stroke depression (she also had depression prior to stroke) is likely affecting her course. Continue follow-up with Behavioral Health. Continue close supervision. They know to go to the ER for any sudden change in symptoms. Follow-up in 4 months, call for any changes.    Thank you for allowing me to participate in the care of  this patient. Please do not hesitate to call for any questions or concerns.   Patrcia Dolly, M.D.  CC: Dr. Cliffton Asters

## 2022-11-16 NOTE — Patient Instructions (Signed)
I hope you start feeling better soon.  Continue all your medications  2. It is strongly recommended to continue home exercise program  3. Follow-up in 4 months, call for any changes

## 2022-11-17 ENCOUNTER — Ambulatory Visit (INDEPENDENT_AMBULATORY_CARE_PROVIDER_SITE_OTHER): Payer: PPO

## 2022-11-17 DIAGNOSIS — I639 Cerebral infarction, unspecified: Secondary | ICD-10-CM

## 2022-11-17 LAB — CUP PACEART REMOTE DEVICE CHECK
Date Time Interrogation Session: 20240829122100
Implantable Pulse Generator Implant Date: 20240626
Pulse Gen Serial Number: 105871

## 2022-11-19 DIAGNOSIS — F332 Major depressive disorder, recurrent severe without psychotic features: Secondary | ICD-10-CM | POA: Diagnosis not present

## 2022-11-24 NOTE — Progress Notes (Signed)
[facility] Summary Report / Loop Recorder 

## 2022-11-27 ENCOUNTER — Emergency Department (HOSPITAL_COMMUNITY): Payer: PPO

## 2022-11-27 ENCOUNTER — Other Ambulatory Visit: Payer: Self-pay

## 2022-11-27 ENCOUNTER — Encounter (HOSPITAL_COMMUNITY): Payer: Self-pay | Admitting: Emergency Medicine

## 2022-11-27 ENCOUNTER — Emergency Department (HOSPITAL_COMMUNITY)
Admission: EM | Admit: 2022-11-27 | Discharge: 2022-11-28 | Disposition: A | Discharge status: Home / Self Care | Payer: PPO | Attending: Emergency Medicine | Admitting: Emergency Medicine

## 2022-11-27 DIAGNOSIS — R9089 Other abnormal findings on diagnostic imaging of central nervous system: Secondary | ICD-10-CM | POA: Diagnosis not present

## 2022-11-27 DIAGNOSIS — W19XXXA Unspecified fall, initial encounter: Secondary | ICD-10-CM

## 2022-11-27 DIAGNOSIS — I700 Atherosclerosis of aorta: Secondary | ICD-10-CM | POA: Diagnosis not present

## 2022-11-27 DIAGNOSIS — M25551 Pain in right hip: Secondary | ICD-10-CM | POA: Diagnosis not present

## 2022-11-27 DIAGNOSIS — S0990XA Unspecified injury of head, initial encounter: Secondary | ICD-10-CM | POA: Diagnosis not present

## 2022-11-27 DIAGNOSIS — Z043 Encounter for examination and observation following other accident: Secondary | ICD-10-CM | POA: Diagnosis not present

## 2022-11-27 DIAGNOSIS — M1712 Unilateral primary osteoarthritis, left knee: Secondary | ICD-10-CM | POA: Diagnosis not present

## 2022-11-27 DIAGNOSIS — N1831 Chronic kidney disease, stage 3a: Secondary | ICD-10-CM | POA: Diagnosis not present

## 2022-11-27 DIAGNOSIS — Z794 Long term (current) use of insulin: Secondary | ICD-10-CM | POA: Insufficient documentation

## 2022-11-27 DIAGNOSIS — I129 Hypertensive chronic kidney disease with stage 1 through stage 4 chronic kidney disease, or unspecified chronic kidney disease: Secondary | ICD-10-CM | POA: Diagnosis not present

## 2022-11-27 DIAGNOSIS — S20311A Abrasion of right front wall of thorax, initial encounter: Secondary | ICD-10-CM | POA: Insufficient documentation

## 2022-11-27 DIAGNOSIS — M1711 Unilateral primary osteoarthritis, right knee: Secondary | ICD-10-CM | POA: Diagnosis not present

## 2022-11-27 DIAGNOSIS — M1612 Unilateral primary osteoarthritis, left hip: Secondary | ICD-10-CM | POA: Diagnosis not present

## 2022-11-27 DIAGNOSIS — I6523 Occlusion and stenosis of bilateral carotid arteries: Secondary | ICD-10-CM | POA: Diagnosis not present

## 2022-11-27 DIAGNOSIS — E1122 Type 2 diabetes mellitus with diabetic chronic kidney disease: Secondary | ICD-10-CM | POA: Diagnosis not present

## 2022-11-27 DIAGNOSIS — R9082 White matter disease, unspecified: Secondary | ICD-10-CM | POA: Insufficient documentation

## 2022-11-27 DIAGNOSIS — S8001XA Contusion of right knee, initial encounter: Secondary | ICD-10-CM | POA: Insufficient documentation

## 2022-11-27 DIAGNOSIS — M542 Cervicalgia: Secondary | ICD-10-CM | POA: Insufficient documentation

## 2022-11-27 DIAGNOSIS — Z853 Personal history of malignant neoplasm of breast: Secondary | ICD-10-CM | POA: Insufficient documentation

## 2022-11-27 DIAGNOSIS — Z7902 Long term (current) use of antithrombotics/antiplatelets: Secondary | ICD-10-CM | POA: Insufficient documentation

## 2022-11-27 DIAGNOSIS — Z79899 Other long term (current) drug therapy: Secondary | ICD-10-CM | POA: Insufficient documentation

## 2022-11-27 DIAGNOSIS — M25552 Pain in left hip: Secondary | ICD-10-CM | POA: Diagnosis not present

## 2022-11-27 DIAGNOSIS — G9389 Other specified disorders of brain: Secondary | ICD-10-CM | POA: Diagnosis not present

## 2022-11-27 DIAGNOSIS — W010XXA Fall on same level from slipping, tripping and stumbling without subsequent striking against object, initial encounter: Secondary | ICD-10-CM | POA: Insufficient documentation

## 2022-11-27 DIAGNOSIS — M25562 Pain in left knee: Secondary | ICD-10-CM | POA: Diagnosis not present

## 2022-11-27 DIAGNOSIS — S8991XA Unspecified injury of right lower leg, initial encounter: Secondary | ICD-10-CM | POA: Diagnosis present

## 2022-11-27 DIAGNOSIS — Z7984 Long term (current) use of oral hypoglycemic drugs: Secondary | ICD-10-CM | POA: Insufficient documentation

## 2022-11-27 DIAGNOSIS — I10 Essential (primary) hypertension: Secondary | ICD-10-CM | POA: Diagnosis not present

## 2022-11-27 DIAGNOSIS — Y92009 Unspecified place in unspecified non-institutional (private) residence as the place of occurrence of the external cause: Secondary | ICD-10-CM | POA: Diagnosis not present

## 2022-11-27 DIAGNOSIS — I959 Hypotension, unspecified: Secondary | ICD-10-CM | POA: Diagnosis not present

## 2022-11-27 DIAGNOSIS — S299XXA Unspecified injury of thorax, initial encounter: Secondary | ICD-10-CM | POA: Diagnosis not present

## 2022-11-27 DIAGNOSIS — S3993XA Unspecified injury of pelvis, initial encounter: Secondary | ICD-10-CM | POA: Diagnosis not present

## 2022-11-27 LAB — COMPREHENSIVE METABOLIC PANEL
ALT: 29 U/L (ref 0–44)
AST: 22 U/L (ref 15–41)
Albumin: 3.1 g/dL — ABNORMAL LOW (ref 3.5–5.0)
Alkaline Phosphatase: 34 U/L — ABNORMAL LOW (ref 38–126)
Anion gap: 9 (ref 5–15)
BUN: 29 mg/dL — ABNORMAL HIGH (ref 8–23)
CO2: 24 mmol/L (ref 22–32)
Calcium: 9 mg/dL (ref 8.9–10.3)
Chloride: 103 mmol/L (ref 98–111)
Creatinine, Ser: 1.53 mg/dL — ABNORMAL HIGH (ref 0.44–1.00)
GFR, Estimated: 36 mL/min — ABNORMAL LOW (ref >60)
Glucose, Bld: 124 mg/dL — ABNORMAL HIGH (ref 70–99)
Potassium: 3.3 mmol/L — ABNORMAL LOW (ref 3.5–5.1)
Sodium: 136 mmol/L (ref 135–145)
Total Bilirubin: 0.8 mg/dL (ref 0.3–1.2)
Total Protein: 6.6 g/dL (ref 6.5–8.1)

## 2022-11-27 LAB — CBC
HCT: 31 % — ABNORMAL LOW (ref 36.0–46.0)
Hemoglobin: 10.3 g/dL — ABNORMAL LOW (ref 12.0–15.0)
MCH: 31.3 pg (ref 26.0–34.0)
MCHC: 33.2 g/dL (ref 30.0–36.0)
MCV: 94.2 fL (ref 80.0–100.0)
Platelets: 226 10*3/uL (ref 150–400)
RBC: 3.29 MIL/uL — ABNORMAL LOW (ref 3.87–5.11)
RDW: 13.9 % (ref 11.5–15.5)
WBC: 7.9 10*3/uL (ref 4.0–10.5)
nRBC: 0 % (ref 0.0–0.2)

## 2022-11-27 LAB — I-STAT CHEM 8, ED
BUN: 30 mg/dL — ABNORMAL HIGH (ref 8–23)
Calcium, Ion: 0.98 mmol/L — ABNORMAL LOW (ref 1.15–1.40)
Chloride: 104 mmol/L (ref 98–111)
Creatinine, Ser: 1.5 mg/dL — ABNORMAL HIGH (ref 0.44–1.00)
Glucose, Bld: 117 mg/dL — ABNORMAL HIGH (ref 70–99)
HCT: 29 % — ABNORMAL LOW (ref 36.0–46.0)
Hemoglobin: 9.9 g/dL — ABNORMAL LOW (ref 12.0–15.0)
Potassium: 3.4 mmol/L — ABNORMAL LOW (ref 3.5–5.1)
Sodium: 137 mmol/L (ref 135–145)
TCO2: 22 mmol/L (ref 22–32)

## 2022-11-27 LAB — PROTIME-INR
INR: 1 (ref 0.8–1.2)
Prothrombin Time: 13.7 s (ref 11.4–15.2)

## 2022-11-27 MED ORDER — ACETAMINOPHEN 500 MG PO TABS
1000.0000 mg | ORAL_TABLET | Freq: Once | ORAL | Status: AC
  Administered 2022-11-27: 1000 mg via ORAL
  Filled 2022-11-27: qty 2

## 2022-11-27 NOTE — Discharge Instructions (Signed)
We evaluated you after your fall.  Your CT scans and x-rays were negative for any fracture and you were able to walk with a walker with no other symptoms.  Please take 1000 mg of Tylenol every 6 hours as needed for pain at home.  We think it is safe to go home.  Please keep a close eye on your symptoms at home, if you do have any new symptoms like chest pain, shortness of breath, nausea, vomiting, trouble walking, fainting, severe pain, or any other new symptoms, please return to the emergency department for reassessment.

## 2022-11-27 NOTE — ED Notes (Signed)
Arrived to CT

## 2022-11-27 NOTE — ED Provider Notes (Signed)
[place] EMERGENCY DEPARTMENT AT [facility] Provider Note  CSN: [id number] Arrival date & time: 11/27/22 2132  Chief Complaint(s) Fall (Plavix)  HPI [name] is a 72 y.o. female history of cancer, prior stroke, diabetes, presenting with fall.  Patient had a trip and fall at home, apparently also struck a box of saws as she was falling.  Patient reports currently she has pain to her neck, left hip and knee.  She also reports some right knee pain.  Denies any pain in her chest or abdomen or upper extremities.  Denies any headaches or vision changes.  She is on Plavix and was called a level 2 trauma before arrival.   Past Medical History Past Medical History:  Diagnosis Date   AKI (acute kidney injury) (HCC) 08/09/2022   Breast cancer (HCC) 12/2019   left breast DCIS   CKD (chronic kidney disease) 06/07/2016   Stage 2, GFR 60-89 ml/min   Diabetic polyneuropathy 06/07/2016   Dyslipidemia    Endometrial adenocarcinoma 2012   Fever blister    Hyperlipidemia    Hypertension, essential 06/07/2016   Major depressive disorder    Mixed dyslipidemia 06/07/2016   MRSA infection 10/2019   left foot wound   Obstructive sleep apnea 06/07/2016   does not use CPAP   Peripheral neuropathy    Retinopathy    Right rib fracture 08/09/2022   Severe obesity    Skin ulcer of left foot with fat layer exposed 11/02/2016   Stroke    Asymptomatic, discovered via neuroimaging; small infarct in left parietal lobe; also concern for small b/l frontal infarcts   Tachycardia 07/19/2017   Traumatic rhabdomyolysis (HCC) 08/09/2022   Type 2 diabetes mellitus with hyperglycemia, with long-term current use of insulin 06/07/2016   Unilateral primary osteoarthritis, right knee 04/16/2019   UTI (urinary tract infection) 08/09/2022   Patient Active Problem List   Diagnosis Date Noted   Fatigue as sequela of cerebrovascular accident (CVA) 11/02/2022   Urinary retention 11/02/2022   Mobility  impaired 11/02/2022   Infarction of left basal ganglia (HCC) 09/14/2022   Acute CVA (cerebrovascular accident) (HCC) 09/10/2022   Major depressive disorder, recurrent episode, moderate (HCC) 08/24/2022   Memory loss due to medical condition 08/24/2022   History of CVA (cerebrovascular accident) 08/12/2022   Pressure injury of skin 08/09/2022   CKD stage 3a, GFR 45-59 ml/min (HCC) 08/09/2022   Lower extremity edema 08/09/2022   Generalized weakness 08/08/2022   Neoplasm of uncertain behavior of skin 12/08/2020   Seborrheic keratosis 12/08/2020   Ductal carcinoma in situ (DCIS) of left breast 01/02/2020   MRSA (methicillin resistant staph aureus) culture positive 10/29/2019   Unilateral primary osteoarthritis, right knee 04/16/2019   Morbid (severe) obesity due to excess calories (HCC) 04/16/2019   Mixed hyperlipidemia 07/19/2017   Palpitations 07/19/2017   Skin ulcer of left foot with fat layer exposed 11/02/2016   Type 2 diabetes mellitus with hyperglycemia, with long-term current use of insulin 06/07/2016   Diabetic peripheral neuropathy (HCC) 06/07/2016   Hypertension, essential 06/07/2016   Obstructive sleep apnea 06/07/2016   Home Medication(s) Prior to Admission medications   Medication Sig Start Date End Date Taking? Authorizing Provider  acetaminophen (TYLENOL) 325 MG tablet Take 2 tablets (650 mg total) by mouth every 4 (four) hours as needed for mild pain (or temp > 37.5 C (99.5 F)). 09/27/22   Angiulli, Mcarthur Rossetti, PA-C  amLODipine (NORVASC) 5 MG tablet Take 5 mg by mouth daily.  [provider]  ascorbic acid (VITAMIN C) 500 MG tablet Take 1 tablet (500 mg total) by mouth daily. 08/24/22   Angiulli, Mcarthur Rossetti, PA-C  atorvastatin (LIPITOR) 80 MG tablet Take 1 tablet (80 mg total) by mouth daily. 09/29/22   Angiulli, Mcarthur Rossetti, PA-C  buPROPion (WELLBUTRIN XL) 150 MG 24 hr tablet Take 1 tablet (150 mg total) by mouth daily. 11/02/22   [name], DO  carvedilol (COREG)  3.125 MG tablet Take 1 tablet (3.125 mg total) by mouth 2 (two) times daily. 09/29/22   Angiulli, Mcarthur Rossetti, PA-C  Cholecalciferol 125 MCG (5000 UT) TABS Take 1 tablet (5,000 Units total) by mouth every evening for 30 days then as directed by MD 09/29/22   Angiulli, Mcarthur Rossetti, PA-C  clopidogrel (PLAVIX) 75 MG tablet Take 1 tablet (75 mg total) by mouth daily. 09/29/22   Angiulli, Mcarthur Rossetti, PA-C  Continuous Glucose Sensor (FREESTYLE LIBRE 2 SENSOR) MISC Apply new sensor every 14 days to monitor blood glucose. Remove old sensor before applying new one. 10/10/22   [provider]  diclofenac Sodium (VOLTAREN) 1 % GEL Apply 4 g topically 4 (four) times daily. 09/29/22   Angiulli, Mcarthur Rossetti, PA-C  escitalopram (LEXAPRO) 20 MG tablet Take 1 tablet (20 mg total) by mouth daily. 09/29/22   Angiulli, Mcarthur Rossetti, PA-C  insulin aspart protamine- aspart (NOVOLOG MIX 70/30) (70-30) 100 UNIT/ML injection Inject 0.25 mLs (25 Units total) into the skin 2 (two) times daily with a meal. 09/29/22   Angiulli, Mcarthur Rossetti, PA-C  losartan (COZAAR) 25 MG tablet Take 1 tablet (25 mg total) by mouth daily. 09/29/22   Angiulli, Mcarthur Rossetti, PA-C  metFORMIN (GLUCOPHAGE) 500 MG tablet Take 1 tablet (500 mg total) by mouth 2 (two) times daily with a meal. 09/29/22   Angiulli, Mcarthur Rossetti, PA-C  tamsulosin (FLOMAX) 0.4 MG CAPS capsule Take 2 capsules (0.8 mg total) by mouth daily after supper. 11/02/22   [name], DO  Zinc Sulfate 220 (50 Zn) MG TABS Take 1 tablet (220 mg total) by mouth daily for 30 days then as directed by MD 09/29/22   Angiulli, Mcarthur Rossetti, PA-C                                                                                                                                    Past Surgical History Past Surgical History:  Procedure Laterality Date   ADRENALECTOMY     BREAST LUMPECTOMY WITH RADIOACTIVE SEED LOCALIZATION Left 02/25/2020   Procedure: LEFT BREAST LUMPECTOMY WITH RADIOACTIVE SEED LOCALIZATION;  Surgeon:  Abigail Miyamoto, MD;  Location: [facility];  Service: General;  Laterality: Left;   BUNIONECTOMY     CARDIAC CATHETERIZATION     CERVICAL SPINE SURGERY     CHOLECYSTECTOMY     DILATION AND CURETTAGE OF UTERUS     LAPAROSCOPIC SALPINGOOPHERECTOMY     LOOP RECORDER INSERTION N/A  09/14/2022   Procedure: LOOP RECORDER INSERTION;  Surgeon: Lanier Prude, MD;  Location: [facility];  Service: Cardiovascular;  Laterality: N/A;   THORACOTOMY     TONSILLECTOMY     Family History Family History  Problem Relation Age of Onset   Hypertension Mother    Alzheimer's disease Mother    Heart attack Father    CAD Father    Alzheimer's disease Father    Bladder Cancer Father        dx late 47s   Diverticulitis Sister    Obesity Sister    Hypertension Sister    Voice disorder Brother    Breast cancer Paternal Aunt 63   Prostate cancer Paternal Uncle        dx mid 64s    Social History Social History   Tobacco Use   Smoking status: Never   Smokeless tobacco: Never  Vaping Use   Vaping status: Never Used  Substance Use Topics   Alcohol use: No   Drug use: No   Allergies Elavil [amitriptyline], Pristiq [desvenlafaxine], Cipro [ciprofloxacin hcl], Penicillins, Sulfa antibiotics, Sulfamethoxazole, and Sulfur  Review of Systems Review of Systems  All other systems reviewed and are negative.   Physical Exam Vital Signs  I have reviewed the triage vital signs BP (!) 182/72 (BP Location: Right Arm)   Pulse 66   Temp (!) 97.5 F (36.4 C) (Temporal)   Resp 20   Ht 5' 9.5" (1.765 m)   Wt 99.8 kg   SpO2 98%   BMI 32.02 kg/m  Physical Exam Vitals and nursing note reviewed.  Constitutional:      General: She is not in acute distress.    Appearance: She is well-developed.  HENT:     Head: Normocephalic and atraumatic.     Mouth/Throat:     Mouth: Mucous membranes are moist.  Eyes:     Pupils: Pupils are equal, round, and reactive to light.   Cardiovascular:     Rate and Rhythm: Normal rate and regular rhythm.     Heart sounds: No murmur heard. Pulmonary:     Effort: Pulmonary effort is normal. No respiratory distress.     Breath sounds: Normal breath sounds.  Abdominal:     General: Abdomen is flat.     Palpations: Abdomen is soft.     Tenderness: There is no abdominal tenderness.  Musculoskeletal:     Right lower leg: No edema.     Left lower leg: No edema.     Comments:  C, T, L-spine tenderness.  Full range of motion of the bilateral upper extremities with no focal tenderness.  Full range of motion of the right hip, ankle with no tenderness or limitation.  Mild painful range of motion of the right knee with overlying bruise, minimal tenderness.  Left lower extremity with mild painful range of motion of the hip but able to range completely, mild tenderness over the left knee, normal left ankle and foot.  Skin:    General: Skin is warm and dry.     Comments: Some abrasions to the right upper chest.  Neurological:     General: No focal deficit present.     Mental Status: She is alert. Mental status is at baseline.  Psychiatric:        Mood and Affect: Mood normal.        Behavior: Behavior normal.     ED Results and Treatments Labs (all labs ordered are listed, but  only abnormal results are displayed) Labs Reviewed  CBC - Abnormal; Notable for the following components:      Result Value   RBC 3.29 (*)    Hemoglobin 10.3 (*)    HCT 31.0 (*)    All other components within normal limits  I-STAT CHEM 8, ED - Abnormal; Notable for the following components:   Potassium 3.4 (*)    BUN 30 (*)    Creatinine, Ser 1.50 (*)    Glucose, Bld 117 (*)    Calcium, Ion 0.98 (*)    Hemoglobin 9.9 (*)    HCT 29.0 (*)    All other components within normal limits  COMPREHENSIVE METABOLIC PANEL  PROTIME-INR                                                                                                                           Radiology CT HEAD WO CONTRAST  Result Date: 11/27/2022 CLINICAL DATA:  Fall. EXAM: CT HEAD WITHOUT CONTRAST CT CERVICAL SPINE WITHOUT CONTRAST TECHNIQUE: Multidetector CT imaging of the head and cervical spine was performed following the standard protocol without intravenous contrast. Multiplanar CT image reconstructions of the cervical spine were also generated. RADIATION DOSE REDUCTION: This exam was performed according to the departmental dose-optimization program which includes automated exposure control, adjustment of the mA and/or kV according to patient size and/or use of iterative reconstruction technique. COMPARISON:  CT head 09/11/2022, CT angio head 09/11/2022 FINDINGS: CT HEAD FINDINGS Brain: Cerebral ventricle sizes are concordant with the degree of cerebral volume loss. Patchy and confluent areas of decreased attenuation are noted throughout the deep and periventricular white matter of the cerebral hemispheres bilaterally, compatible with chronic microvascular ischemic disease. Chronic left parietal encephalomalacia. No evidence of large-territorial acute infarction. No parenchymal hemorrhage. No mass lesion. No extra-axial collection. No mass effect or midline shift. No hydrocephalus. Basilar cisterns are patent. Vascular: No hyperdense vessel. Skull: No acute fracture or focal lesion. Sinuses/Orbits: Paranasal sinuses and mastoid air cells are clear. Bilateral lens replacement. Otherwise the orbits are unremarkable. Other: None. CT CERVICAL SPINE FINDINGS Alignment: Normal. Skull base and vertebrae: C5-C6 anterior cervical discectomy infusion. No acute fracture. No aggressive appearing focal osseous lesion or focal pathologic process. Soft tissues and spinal canal: No prevertebral fluid or swelling. No visible canal hematoma. Upper chest: Unremarkable. Other: Atherosclerotic plaque of the carotid arteries within the neck. IMPRESSION: 1. No acute intracranial abnormality. 2. No acute displaced  fracture or traumatic listhesis of the cervical spine. Electronically Signed   By: Tish Frederickson M.D.   On: 11/27/2022 22:11   CT CERVICAL SPINE WO CONTRAST  Result Date: 11/27/2022 CLINICAL DATA:  Fall. EXAM: CT HEAD WITHOUT CONTRAST CT CERVICAL SPINE WITHOUT CONTRAST TECHNIQUE: Multidetector CT imaging of the head and cervical spine was performed following the standard protocol without intravenous contrast. Multiplanar CT image reconstructions of the cervical spine were also generated. RADIATION DOSE REDUCTION: This exam was performed according to the departmental dose-optimization program which  includes automated exposure control, adjustment of the mA and/or kV according to patient size and/or use of iterative reconstruction technique. COMPARISON:  CT head 09/11/2022, CT angio head 09/11/2022 FINDINGS: CT HEAD FINDINGS Brain: Cerebral ventricle sizes are concordant with the degree of cerebral volume loss. Patchy and confluent areas of decreased attenuation are noted throughout the deep and periventricular white matter of the cerebral hemispheres bilaterally, compatible with chronic microvascular ischemic disease. Chronic left parietal encephalomalacia. No evidence of large-territorial acute infarction. No parenchymal hemorrhage. No mass lesion. No extra-axial collection. No mass effect or midline shift. No hydrocephalus. Basilar cisterns are patent. Vascular: No hyperdense vessel. Skull: No acute fracture or focal lesion. Sinuses/Orbits: Paranasal sinuses and mastoid air cells are clear. Bilateral lens replacement. Otherwise the orbits are unremarkable. Other: None. CT CERVICAL SPINE FINDINGS Alignment: Normal. Skull base and vertebrae: C5-C6 anterior cervical discectomy infusion. No acute fracture. No aggressive appearing focal osseous lesion or focal pathologic process. Soft tissues and spinal canal: No prevertebral fluid or swelling. No visible canal hematoma. Upper chest: Unremarkable. Other:  Atherosclerotic plaque of the carotid arteries within the neck. IMPRESSION: 1. No acute intracranial abnormality. 2. No acute displaced fracture or traumatic listhesis of the cervical spine. Electronically Signed   By: Tish Frederickson M.D.   On: 11/27/2022 22:11   DG Pelvis Portable  Result Date: 11/27/2022 CLINICAL DATA:  Trauma.  Fall EXAM: PORTABLE PELVIS 1-2 VIEWS COMPARISON:  None Available. FINDINGS: There is no evidence of pelvic fracture or diastasis. No acute displaced fracture or dislocation of either hips on frontal view. No pelvic bone lesions are seen. IMPRESSION: Negative. Electronically Signed   By: Tish Frederickson M.D.   On: 11/27/2022 22:09   DG Chest Port 1 View  Result Date: 11/27/2022 CLINICAL DATA:  Trauma EXAM: PORTABLE CHEST 1 VIEW COMPARISON:  Chest x-ray 05/24/2006, chest x-ray 09/10/2022 FINDINGS: Wireless cardiac device overlies left chest. The heart and mediastinal contours are unchanged. Aortic calcification. Surgical clips overlie the left lower lung zone. No focal consolidation. No pulmonary edema. No pleural effusion. No pneumothorax. No acute osseous abnormality. IMPRESSION: 1. No active disease. 2.  Aortic Atherosclerosis (ICD10-I70.0). Electronically Signed   By: Tish Frederickson M.D.   On: 11/27/2022 22:08    Pertinent labs & imaging results that were available during my care of the patient were reviewed by me and considered in my medical decision making (see MDM for details).  Medications Ordered in ED Medications - No data to display                                                                                                                                   Procedures Procedures  (including critical care time)  Medical Decision Making / ED Course   MDM:  72 year old presenting as a level 2 trauma since she is on Plavix after having a ground-level fall.  Has a few abrasions to her chest, some hip pain  but intact range of motion, bilateral knee pain.   Also complains of some mild neck pain.  Will obtain CT head, CT neck, x-rays of lower extremity to evaluate for injury.  If all negative and patient able to ambulate anticipate discharge.      Additional history obtained: -Additional history obtained from {wsadditionalhistorian:[id number]} -External records from outside source obtained and reviewed including: Chart review including previous notes, labs, imaging, consultation notes including ***   Lab Tests: -I ordered, reviewed, and interpreted labs.   The pertinent results include:   Labs Reviewed  CBC - Abnormal; Notable for the following components:      Result Value   RBC 3.29 (*)    Hemoglobin 10.3 (*)    HCT 31.0 (*)    All other components within normal limits  I-STAT CHEM 8, ED - Abnormal; Notable for the following components:   Potassium 3.4 (*)    BUN 30 (*)    Creatinine, Ser 1.50 (*)    Glucose, Bld 117 (*)    Calcium, Ion 0.98 (*)    Hemoglobin 9.9 (*)    HCT 29.0 (*)    All other components within normal limits  COMPREHENSIVE METABOLIC PANEL  PROTIME-INR    Notable for ***  EKG   EKG Interpretation Date/Time:    Ventricular Rate:    PR Interval:    QRS Duration:    QT Interval:    QTC Calculation:   R Axis:      Text Interpretation:           Imaging Studies ordered: I ordered imaging studies including *** On my interpretation imaging demonstrates *** I independently visualized and interpreted imaging. I agree with the radiologist interpretation   Medicines ordered and prescription drug management: No orders of the defined types were placed in this encounter.   -I have reviewed the patients home medicines and have made adjustments as needed   Consultations Obtained: I requested consultation with the ***,  and discussed lab and imaging findings as well as pertinent plan - they recommend: ***   Cardiac Monitoring: The patient was maintained on a cardiac monitor.  I personally viewed and  interpreted the cardiac monitored which showed an underlying rhythm of: ***  Social Determinants of Health:  Diagnosis or treatment significantly limited by social determinants of health: {wssoc:28071}   Reevaluation: After the interventions noted above, I reevaluated the patient and found that their symptoms have {resolved/improved/worsened:23923::"improved"}  Co morbidities that complicate the patient evaluation  Past Medical History:  Diagnosis Date   AKI (acute kidney injury) (HCC) 08/09/2022   Breast cancer (HCC) 12/2019   left breast DCIS   CKD (chronic kidney disease) 06/07/2016   Stage 2, GFR 60-89 ml/min   Diabetic polyneuropathy 06/07/2016   Dyslipidemia    Endometrial adenocarcinoma 2012   Fever blister    Hyperlipidemia    Hypertension, essential 06/07/2016   Major depressive disorder    Mixed dyslipidemia 06/07/2016   MRSA infection 10/2019   left foot wound   Obstructive sleep apnea 06/07/2016   does not use CPAP   Peripheral neuropathy    Retinopathy    Right rib fracture 08/09/2022   Severe obesity    Skin ulcer of left foot with fat layer exposed 11/02/2016   Stroke    Asymptomatic, discovered via neuroimaging; small infarct in left parietal lobe; also concern for small b/l frontal infarcts   Tachycardia 07/19/2017   Traumatic rhabdomyolysis (HCC) 08/09/2022   Type 2 diabetes mellitus  with hyperglycemia, with long-term current use of insulin 06/07/2016   Unilateral primary osteoarthritis, right knee 04/16/2019   UTI (urinary tract infection) 08/09/2022      Dispostion: Disposition decision including need for hospitalization was considered, and patient {wsdispo:28070::"discharged from emergency department."}    Final Clinical Impression(s) / ED Diagnoses Final diagnoses:  None     This chart was dictated using voice recognition software.  Despite best efforts to proofread,  errors can occur which can change the documentation meaning.

## 2022-11-27 NOTE — ED Triage Notes (Addendum)
Presents from home for fall on plavix. Now pain to L hip and L knee. No HA. Contusion noted to L cheek. Arrives A&Ox4. Abrasion to R shoulder from box of saws which she struck while falling.  No meds en route.  Baseline bilateral leg weakness (L >R) is baseline for patient

## 2022-11-27 NOTE — Progress Notes (Signed)
Orthopedic Tech Progress Note Patient Details:  [name] [birth year] [id number]  [name], female   DOB: 01/20/1951, 72 y.o.   MRN: [id number] I attended trauma page. Trinna Post 11/27/2022, 10:28 PM

## 2022-11-28 DIAGNOSIS — S0990XA Unspecified injury of head, initial encounter: Secondary | ICD-10-CM | POA: Diagnosis not present

## 2022-11-28 NOTE — ED Notes (Signed)
Prior to DC patient ambulated in room with tech and baseline walker.

## 2022-11-30 ENCOUNTER — Telehealth: Payer: Self-pay | Admitting: Podiatry

## 2022-11-30 DIAGNOSIS — R351 Nocturia: Secondary | ICD-10-CM | POA: Diagnosis not present

## 2022-11-30 DIAGNOSIS — N3941 Urge incontinence: Secondary | ICD-10-CM | POA: Diagnosis not present

## 2022-11-30 DIAGNOSIS — R3914 Feeling of incomplete bladder emptying: Secondary | ICD-10-CM | POA: Diagnosis not present

## 2022-11-30 NOTE — Telephone Encounter (Signed)
Pt was discharged from the hospital when she had a stroke she has been using curad xeroform petrilatum dressing in 5x9 inch since she was discharged 09/30/22. She wants to know can she get a refill.

## 2022-12-02 DIAGNOSIS — F3341 Major depressive disorder, recurrent, in partial remission: Secondary | ICD-10-CM | POA: Diagnosis not present

## 2022-12-02 DIAGNOSIS — Z23 Encounter for immunization: Secondary | ICD-10-CM | POA: Diagnosis not present

## 2022-12-02 DIAGNOSIS — Z794 Long term (current) use of insulin: Secondary | ICD-10-CM | POA: Diagnosis not present

## 2022-12-02 DIAGNOSIS — N183 Chronic kidney disease, stage 3 unspecified: Secondary | ICD-10-CM | POA: Diagnosis not present

## 2022-12-02 DIAGNOSIS — I129 Hypertensive chronic kidney disease with stage 1 through stage 4 chronic kidney disease, or unspecified chronic kidney disease: Secondary | ICD-10-CM | POA: Diagnosis not present

## 2022-12-02 DIAGNOSIS — Z8673 Personal history of transient ischemic attack (TIA), and cerebral infarction without residual deficits: Secondary | ICD-10-CM | POA: Diagnosis not present

## 2022-12-02 DIAGNOSIS — E669 Obesity, unspecified: Secondary | ICD-10-CM | POA: Diagnosis not present

## 2022-12-02 DIAGNOSIS — Z9181 History of falling: Secondary | ICD-10-CM | POA: Diagnosis not present

## 2022-12-02 DIAGNOSIS — I700 Atherosclerosis of aorta: Secondary | ICD-10-CM | POA: Diagnosis not present

## 2022-12-02 DIAGNOSIS — L97522 Non-pressure chronic ulcer of other part of left foot with fat layer exposed: Secondary | ICD-10-CM | POA: Diagnosis not present

## 2022-12-02 DIAGNOSIS — E1121 Type 2 diabetes mellitus with diabetic nephropathy: Secondary | ICD-10-CM | POA: Diagnosis not present

## 2022-12-02 DIAGNOSIS — Z9989 Dependence on other enabling machines and devices: Secondary | ICD-10-CM | POA: Diagnosis not present

## 2022-12-02 DIAGNOSIS — Z89429 Acquired absence of other toe(s), unspecified side: Secondary | ICD-10-CM | POA: Diagnosis not present

## 2022-12-06 DIAGNOSIS — E1142 Type 2 diabetes mellitus with diabetic polyneuropathy: Secondary | ICD-10-CM | POA: Diagnosis not present

## 2022-12-06 DIAGNOSIS — Z89411 Acquired absence of right great toe: Secondary | ICD-10-CM | POA: Diagnosis not present

## 2022-12-06 DIAGNOSIS — Z794 Long term (current) use of insulin: Secondary | ICD-10-CM | POA: Diagnosis not present

## 2022-12-14 DIAGNOSIS — R3914 Feeling of incomplete bladder emptying: Secondary | ICD-10-CM | POA: Diagnosis not present

## 2022-12-14 DIAGNOSIS — R351 Nocturia: Secondary | ICD-10-CM | POA: Diagnosis not present

## 2022-12-19 DIAGNOSIS — F332 Major depressive disorder, recurrent severe without psychotic features: Secondary | ICD-10-CM | POA: Diagnosis not present

## 2022-12-19 LAB — CUP PACEART REMOTE DEVICE CHECK
Date Time Interrogation Session: 20240930012500
Implantable Pulse Generator Implant Date: 20240626
Pulse Gen Serial Number: 105871

## 2022-12-20 DIAGNOSIS — G4733 Obstructive sleep apnea (adult) (pediatric): Secondary | ICD-10-CM | POA: Diagnosis not present

## 2022-12-20 DIAGNOSIS — E1121 Type 2 diabetes mellitus with diabetic nephropathy: Secondary | ICD-10-CM | POA: Diagnosis not present

## 2022-12-20 DIAGNOSIS — N183 Chronic kidney disease, stage 3 unspecified: Secondary | ICD-10-CM | POA: Diagnosis not present

## 2022-12-21 DIAGNOSIS — Z5181 Encounter for therapeutic drug level monitoring: Secondary | ICD-10-CM | POA: Diagnosis not present

## 2022-12-21 DIAGNOSIS — F331 Major depressive disorder, recurrent, moderate: Secondary | ICD-10-CM | POA: Diagnosis not present

## 2022-12-21 DIAGNOSIS — G4733 Obstructive sleep apnea (adult) (pediatric): Secondary | ICD-10-CM | POA: Diagnosis not present

## 2022-12-26 ENCOUNTER — Emergency Department (HOSPITAL_COMMUNITY): Payer: PPO

## 2022-12-26 ENCOUNTER — Emergency Department (HOSPITAL_COMMUNITY)
Admission: EM | Admit: 2022-12-26 | Discharge: 2022-12-26 | Disposition: A | Discharge status: Home / Self Care | Payer: PPO | Attending: Emergency Medicine | Admitting: Emergency Medicine

## 2022-12-26 DIAGNOSIS — W19XXXA Unspecified fall, initial encounter: Secondary | ICD-10-CM | POA: Diagnosis not present

## 2022-12-26 DIAGNOSIS — Z794 Long term (current) use of insulin: Secondary | ICD-10-CM | POA: Diagnosis not present

## 2022-12-26 DIAGNOSIS — S0003XA Contusion of scalp, initial encounter: Secondary | ICD-10-CM | POA: Diagnosis not present

## 2022-12-26 DIAGNOSIS — W0110XA Fall on same level from slipping, tripping and stumbling with subsequent striking against unspecified object, initial encounter: Secondary | ICD-10-CM | POA: Insufficient documentation

## 2022-12-26 DIAGNOSIS — S0990XA Unspecified injury of head, initial encounter: Secondary | ICD-10-CM

## 2022-12-26 DIAGNOSIS — Z981 Arthrodesis status: Secondary | ICD-10-CM | POA: Diagnosis not present

## 2022-12-26 DIAGNOSIS — S06310A Contusion and laceration of right cerebrum without loss of consciousness, initial encounter: Secondary | ICD-10-CM | POA: Diagnosis not present

## 2022-12-26 DIAGNOSIS — S199XXA Unspecified injury of neck, initial encounter: Secondary | ICD-10-CM | POA: Diagnosis not present

## 2022-12-26 DIAGNOSIS — Z7902 Long term (current) use of antithrombotics/antiplatelets: Secondary | ICD-10-CM | POA: Insufficient documentation

## 2022-12-26 DIAGNOSIS — I10 Essential (primary) hypertension: Secondary | ICD-10-CM | POA: Diagnosis not present

## 2022-12-26 LAB — CBC WITH DIFFERENTIAL/PLATELET
Abs Immature Granulocytes: 0.04 10*3/uL (ref 0.00–0.07)
Basophils Absolute: 0.1 10*3/uL (ref 0.0–0.1)
Basophils Relative: 1 %
Eosinophils Absolute: 0.1 10*3/uL (ref 0.0–0.5)
Eosinophils Relative: 1 %
HCT: 30.9 % — ABNORMAL LOW (ref 36.0–46.0)
Hemoglobin: 9.9 g/dL — ABNORMAL LOW (ref 12.0–15.0)
Immature Granulocytes: 1 %
Lymphocytes Relative: 25 %
Lymphs Abs: 1.8 10*3/uL (ref 0.7–4.0)
MCH: 31.4 pg (ref 26.0–34.0)
MCHC: 32 g/dL (ref 30.0–36.0)
MCV: 98.1 fL (ref 80.0–100.0)
Monocytes Absolute: 0.6 10*3/uL (ref 0.1–1.0)
Monocytes Relative: 9 %
Neutro Abs: 4.6 10*3/uL (ref 1.7–7.7)
Neutrophils Relative %: 63 %
Platelets: 237 10*3/uL (ref 150–400)
RBC: 3.15 MIL/uL — ABNORMAL LOW (ref 3.87–5.11)
RDW: 14.4 % (ref 11.5–15.5)
WBC: 7.2 10*3/uL (ref 4.0–10.5)
nRBC: 0 % (ref 0.0–0.2)

## 2022-12-26 LAB — BASIC METABOLIC PANEL
Anion gap: 9 (ref 5–15)
BUN: 35 mg/dL — ABNORMAL HIGH (ref 8–23)
CO2: 23 mmol/L (ref 22–32)
Calcium: 8.9 mg/dL (ref 8.9–10.3)
Chloride: 104 mmol/L (ref 98–111)
Creatinine, Ser: 1.63 mg/dL — ABNORMAL HIGH (ref 0.44–1.00)
GFR, Estimated: 34 mL/min — ABNORMAL LOW (ref >60)
Glucose, Bld: 114 mg/dL — ABNORMAL HIGH (ref 70–99)
Potassium: 3.8 mmol/L (ref 3.5–5.1)
Sodium: 136 mmol/L (ref 135–145)

## 2022-12-26 LAB — I-STAT CHEM 8, ED
BUN: 40 mg/dL — ABNORMAL HIGH (ref 8–23)
Calcium, Ion: 1.19 mmol/L (ref 1.15–1.40)
Chloride: 103 mmol/L (ref 98–111)
Creatinine, Ser: 1.8 mg/dL — ABNORMAL HIGH (ref 0.44–1.00)
Glucose, Bld: 127 mg/dL — ABNORMAL HIGH (ref 70–99)
HCT: 30 % — ABNORMAL LOW (ref 36.0–46.0)
Hemoglobin: 10.2 g/dL — ABNORMAL LOW (ref 12.0–15.0)
Potassium: 4.3 mmol/L (ref 3.5–5.1)
Sodium: 139 mmol/L (ref 135–145)
TCO2: 25 mmol/L (ref 22–32)

## 2022-12-26 LAB — I-STAT CG4 LACTIC ACID, ED: Lactic Acid, Venous: 1.8 mmol/L (ref 0.5–1.9)

## 2022-12-26 MED ORDER — ACETAMINOPHEN 500 MG PO TABS
1000.0000 mg | ORAL_TABLET | Freq: Once | ORAL | Status: AC
  Administered 2022-12-26: 1000 mg via ORAL
  Filled 2022-12-26: qty 2

## 2022-12-26 NOTE — ED Provider Notes (Signed)
[facility]MENT AT [facility] Provider Note   CSN: [id number] Arrival date & time: 12/26/22  1732     History  Chief Complaint  Patient presents with   level 2 FOT   Level 2   Fall    [name] is a 72 y.o. female.  72 year old female medical history as detailed below presents for evaluation.  Patient was at home.  She bent down to clean something off the floor.  As she was standing she lost her balance.  She reached for her walker which scooted away for her.  She then proceeded to fall backwards and struck her head against the floor.  She did not pass out.  She complains of pain to the posterior right scalp.  She denies neck pain.  She denies syncope.  She denies chest pain or shortness of breath.  She denies lower extremity or hip pain.  She was able to stand for EMS during transport to the ED.  The history is provided by the patient and medical records.       Home Medications Prior to Admission medications   Medication Sig Start Date End Date Taking? Authorizing Provider  acetaminophen (TYLENOL) 325 MG tablet Take 2 tablets (650 mg total) by mouth every 4 (four) hours as needed for mild pain (or temp > 37.5 C (99.5 F)). 09/27/22   Angiulli, Mcarthur Rossetti, PA-C  amLODipine (NORVASC) 5 MG tablet Take 5 mg by mouth daily.    [provider]  ascorbic acid (VITAMIN C) 500 MG tablet Take 1 tablet (500 mg total) by mouth daily. 08/24/22   Angiulli, Mcarthur Rossetti, PA-C  atorvastatin (LIPITOR) 80 MG tablet Take 1 tablet (80 mg total) by mouth daily. 09/29/22   Angiulli, Mcarthur Rossetti, PA-C  buPROPion (WELLBUTRIN XL) 150 MG 24 hr tablet Take 1 tablet (150 mg total) by mouth daily. 11/02/22   [name], DO  carvedilol (COREG) 3.125 MG tablet Take 1 tablet (3.125 mg total) by mouth 2 (two) times daily. 09/29/22   Angiulli, Mcarthur Rossetti, PA-C  Cholecalciferol 125 MCG (5000 UT) TABS Take 1 tablet (5,000 Units total) by mouth every evening for 30 days then as directed  by MD 09/29/22   Angiulli, Mcarthur Rossetti, PA-C  clopidogrel (PLAVIX) 75 MG tablet Take 1 tablet (75 mg total) by mouth daily. 09/29/22   Angiulli, Mcarthur Rossetti, PA-C  Continuous Glucose Sensor (FREESTYLE LIBRE 2 SENSOR) MISC Apply new sensor every 14 days to monitor blood glucose. Remove old sensor before applying new one. 10/10/22   [provider]  diclofenac Sodium (VOLTAREN) 1 % GEL Apply 4 g topically 4 (four) times daily. 09/29/22   Angiulli, Mcarthur Rossetti, PA-C  escitalopram (LEXAPRO) 20 MG tablet Take 1 tablet (20 mg total) by mouth daily. 09/29/22   Angiulli, Mcarthur Rossetti, PA-C  insulin aspart protamine- aspart (NOVOLOG MIX 70/30) (70-30) 100 UNIT/ML injection Inject 0.25 mLs (25 Units total) into the skin 2 (two) times daily with a meal. 09/29/22   Angiulli, Mcarthur Rossetti, PA-C  losartan (COZAAR) 25 MG tablet Take 1 tablet (25 mg total) by mouth daily. 09/29/22   Angiulli, Mcarthur Rossetti, PA-C  metFORMIN (GLUCOPHAGE) 500 MG tablet Take 1 tablet (500 mg total) by mouth 2 (two) times daily with a meal. 09/29/22   Angiulli, Mcarthur Rossetti, PA-C  tamsulosin (FLOMAX) 0.4 MG CAPS capsule Take 2 capsules (0.8 mg total) by mouth daily after supper. 11/02/22   Elijah Birk C, DO  Zinc Sulfate  220 (50 Zn) MG TABS Take 1 tablet (220 mg total) by mouth daily for 30 days then as directed by MD 09/29/22   Angiulli, Mcarthur Rossetti, PA-C      Allergies    Elavil [amitriptyline], Pristiq [desvenlafaxine], Cipro [ciprofloxacin hcl], Penicillins, Sulfa antibiotics, Sulfamethoxazole, and Sulfur    Review of Systems   Review of Systems  All other systems reviewed and are negative.   Physical Exam Updated Vital Signs BP (!) 172/84 (BP Location: Left Arm)   Pulse 67   Temp 97.9 F (36.6 C) (Oral)   Resp 17   Ht 5\' 9"  (1.753 m)   Wt 99.3 kg   SpO2 100%   BMI 32.34 kg/m  Physical Exam Vitals and nursing note reviewed.  Constitutional:      General: She is not in acute distress.    Appearance: Normal appearance. She is well-developed.   HENT:     Head: Normocephalic.     Comments: Superficial contusion to the right posterior scalp.  No active bleeding.  No break in the skin. Eyes:     Conjunctiva/sclera: Conjunctivae normal.     Pupils: Pupils are equal, round, and reactive to light.  Cardiovascular:     Rate and Rhythm: Normal rate and regular rhythm.     Heart sounds: Normal heart sounds.  Pulmonary:     Effort: Pulmonary effort is normal. No respiratory distress.     Breath sounds: Normal breath sounds.  Abdominal:     General: There is no distension.     Palpations: Abdomen is soft.     Tenderness: There is no abdominal tenderness.  Musculoskeletal:        General: No deformity. Normal range of motion.     Cervical back: Normal range of motion and neck supple. No tenderness.  Skin:    General: Skin is warm and dry.  Neurological:     General: No focal deficit present.     Mental Status: She is alert and oriented to person, place, and time.     ED Results / Procedures / Treatments   Labs (all labs ordered are listed, but only abnormal results are displayed) Labs Reviewed  I-STAT CHEM 8, ED - Abnormal; Notable for the following components:      Result Value   BUN 40 (*)    Creatinine, Ser 1.80 (*)    Glucose, Bld 127 (*)    Hemoglobin 10.2 (*)    HCT 30.0 (*)    All other components within normal limits  I-STAT CG4 LACTIC ACID, ED    EKG None  Radiology No results found.  Procedures Procedures    Medications Ordered in ED Medications - No data to display  ED Course/ Medical Decision Making/ A&P                                 Medical Decision Making Amount and/or Complexity of Data Reviewed Labs: ordered. Radiology: ordered.  Risk OTC drugs.    Medical Screen Complete  This patient presented to the ED with complaint of fall, head injury.  This complaint involves an extensive number of treatment options. The initial differential diagnosis includes, but is not limited to,  trauma from fall  This presentation is: Acute, Chronic, Self-Limited, Previously Undiagnosed, Uncertain Prognosis, Complicated, Systemic Symptoms, and Threat to Life/Bodily Function  Patient presents after fall from standing.  Patient did strike her head.  She is on  Plavix at baseline.  History and exam are not suggestive of significant traumatic injury.  Obtained imaging is without significant abnormality.  Obtained labs are demonstrative of mild CKD.  Patient does have close outpatient follow-up.  Patient and family member present at bedside understand need for close outpatient follow-up and likely repeat of BUN and creatinine within the next 2 weeks.  Importance of close follow-up is stressed.  Strict return precautions given and understood.  Additional history obtained:  Additional history obtained from EMS and Family External records from outside sources obtained and reviewed including prior ED visits and prior Inpatient records.    Lab Tests:  I ordered and personally interpreted labs.  The pertinent results include: CBC, BMP, i-STAT Chem-8, i-STAT lactic acid   Imaging Studies ordered:  I ordered imaging studies including CT head, CT cervical spine I independently visualized and interpreted obtained imaging which showed NAD I agree with the radiologist interpretation.   Cardiac Monitoring:  The patient was maintained on a cardiac monitor.  I personally viewed and interpreted the cardiac monitor which showed an underlying rhythm of: NSR   Medicines ordered:  I ordered medication including acetaminophen for pain Reevaluation of the patient after these medicines showed that the patient: improved    Problem List / ED Course:  Fall, head injury   Reevaluation:  After the interventions noted above, I reevaluated the patient and found that they have: improved  Disposition:  After consideration of the diagnostic results and the patients response to treatment, I  feel that the patent would benefit from close outpatient follow-up.          Final Clinical Impression(s) / ED Diagnoses Final diagnoses:  Fall, initial encounter  Injury of head, initial encounter    Rx / DC Orders ED Discharge Orders     None         Wynetta Fines, MD 12/26/22 2134

## 2022-12-26 NOTE — ED Notes (Signed)
Assumed care on patient , denies pain/respirations unlabored . IV site unremarkable .

## 2022-12-26 NOTE — ED Notes (Signed)
Trauma Response Nurse Documentation  [name] is a 72 y.o. female arriving to [facility] ED via EMS  On clopidogrel 75 mg daily. Trauma was activated as a Level 2 based on the following trauma criteria Elderly patients > 65 with head trauma on anti-coagulation (excluding ASA).  Patient cleared for CT by Dr. Rodena Medin. Pt transported to CT with trauma response nurse present to monitor. RN remained with the patient throughout their absence from the department for clinical observation. GCS 15.  History   Past Medical History:  Diagnosis Date   AKI (acute kidney injury) (HCC) 08/09/2022   Breast cancer (HCC) 12/2019   left breast DCIS   CKD (chronic kidney disease) 06/07/2016   Stage 2, GFR 60-89 ml/min   Diabetic polyneuropathy 06/07/2016   Dyslipidemia    Endometrial adenocarcinoma 2012   Fever blister    Hyperlipidemia    Hypertension, essential 06/07/2016   Major depressive disorder    Mixed dyslipidemia 06/07/2016   MRSA infection 10/2019   left foot wound   Obstructive sleep apnea 06/07/2016   does not use CPAP   Peripheral neuropathy    Retinopathy    Right rib fracture 08/09/2022   Severe obesity    Skin ulcer of left foot with fat layer exposed 11/02/2016   Stroke    Asymptomatic, discovered via neuroimaging; small infarct in left parietal lobe; also concern for small b/l frontal infarcts   Tachycardia 07/19/2017   Traumatic rhabdomyolysis (HCC) 08/09/2022   Type 2 diabetes mellitus with hyperglycemia, with long-term current use of insulin 06/07/2016   Unilateral primary osteoarthritis, right knee 04/16/2019   UTI (urinary tract infection) 08/09/2022     Past Surgical History:  Procedure Laterality Date   ADRENALECTOMY     BREAST LUMPECTOMY WITH RADIOACTIVE SEED LOCALIZATION Left 02/25/2020   Procedure: LEFT BREAST LUMPECTOMY WITH RADIOACTIVE SEED LOCALIZATION;  Surgeon: Abigail Miyamoto, MD;  Location: [facility];  Service: General;  Laterality:  Left;   BUNIONECTOMY     CARDIAC CATHETERIZATION     CERVICAL SPINE SURGERY     CHOLECYSTECTOMY     DILATION AND CURETTAGE OF UTERUS     LAPAROSCOPIC SALPINGOOPHERECTOMY     LOOP RECORDER INSERTION N/A 09/14/2022   Procedure: LOOP RECORDER INSERTION;  Surgeon: Lanier Prude, MD;  Location: [facility];  Service: Cardiovascular;  Laterality: N/A;   THORACOTOMY     TONSILLECTOMY       Initial Focused Assessment (If applicable, or please see trauma documentation): Patient A&Ox4, GCS 15, PERR 3 Airway intact, bilateral breath sounds Pulses 2+, boot on L foot that has "been there a long time due to delayed healing" Previous strokes requiring rehab, no major deficits, some gait instability and uses a walker  CT's Completed:   CT Head and CT C-Spine   Interventions:  IV, labs CT Head/Cspine  Plan for disposition:  Discharge home   Event Summary: Patient to ED after a fall while trying to get something off the floor, grabbed for her walked and went down hitting the back of her head causing a small bruise and underlying hematoma. Patient takes Plavix for several previous strokes. Imaging revealed no traumatic injury, soft tissue hematoma. Patient to be discharged back to sisters house.  Bedside handoff with ED RN Katrina.    Jill Side Siya Flurry  Trauma Response RN  Please call TRN at [phone] for further assistance.

## 2022-12-26 NOTE — ED Notes (Signed)
Pt transported to CT with RN on portable monitor.

## 2022-12-26 NOTE — Progress Notes (Signed)
Orthopedic Tech Progress Note Patient Details:  [name] 1950-08-08 [id number] Level 2 Trauma. Not needed [name], female   DOB: [birth year], 72 y.o.   MRN: [id number]  [name] 12/26/2022, 5:41 PM

## 2022-12-26 NOTE — Discharge Instructions (Addendum)
Return for any problem.  ?

## 2022-12-26 NOTE — ED Triage Notes (Signed)
Pt to ED via EMS from sisters home. Pt is on plavix. Pt has hx of orthostatic hypotension. Pt bent down to clean spill on floor, reached for walker, and fell. Pt hit back of head and has a right sided hematoma. No LOC. No neck or back pain. +orthostats with EMS. Pt c/o headache upon arrival to ED.    EMS: 70 HR 99% RA 139 CBG 20 RFA

## 2022-12-30 DIAGNOSIS — R296 Repeated falls: Secondary | ICD-10-CM | POA: Diagnosis not present

## 2022-12-30 DIAGNOSIS — D649 Anemia, unspecified: Secondary | ICD-10-CM | POA: Diagnosis not present

## 2022-12-30 DIAGNOSIS — S0003XD Contusion of scalp, subsequent encounter: Secondary | ICD-10-CM | POA: Diagnosis not present

## 2022-12-30 DIAGNOSIS — Z9181 History of falling: Secondary | ICD-10-CM | POA: Diagnosis not present

## 2022-12-30 DIAGNOSIS — Z8673 Personal history of transient ischemic attack (TIA), and cerebral infarction without residual deficits: Secondary | ICD-10-CM | POA: Diagnosis not present

## 2022-12-30 DIAGNOSIS — I129 Hypertensive chronic kidney disease with stage 1 through stage 4 chronic kidney disease, or unspecified chronic kidney disease: Secondary | ICD-10-CM | POA: Diagnosis not present

## 2022-12-30 DIAGNOSIS — N183 Chronic kidney disease, stage 3 unspecified: Secondary | ICD-10-CM | POA: Diagnosis not present

## 2022-12-30 DIAGNOSIS — F3341 Major depressive disorder, recurrent, in partial remission: Secondary | ICD-10-CM | POA: Diagnosis not present

## 2023-01-04 ENCOUNTER — Ambulatory Visit: Payer: PPO | Admitting: Physical Medicine and Rehabilitation

## 2023-01-05 ENCOUNTER — Encounter: Payer: Self-pay | Admitting: Podiatry

## 2023-01-05 ENCOUNTER — Ambulatory Visit: Payer: PPO | Admitting: Podiatry

## 2023-01-05 DIAGNOSIS — M79675 Pain in left toe(s): Secondary | ICD-10-CM | POA: Diagnosis not present

## 2023-01-05 DIAGNOSIS — B351 Tinea unguium: Secondary | ICD-10-CM | POA: Diagnosis not present

## 2023-01-05 DIAGNOSIS — M79674 Pain in right toe(s): Secondary | ICD-10-CM | POA: Diagnosis not present

## 2023-01-05 DIAGNOSIS — Z7901 Long term (current) use of anticoagulants: Secondary | ICD-10-CM | POA: Diagnosis not present

## 2023-01-05 NOTE — Progress Notes (Signed)
Subjective: Chief Complaint  Patient presents with   Routine Post Op    PATIENT STATES THAT SHE WILL LIKE HER TOE NAILS CUT  PLEASE TAKE A LOOK AT HER 2ND TOE AND IS LOOKING FORWARD OF GETTING OUT THE BOOT TODAY AND ORDERING NEW SHOES.   Fall    PATIENT SISTER STATES THAT PATIENT FALL 3 TIMES LAST WEEK 4TH, 7TH,8TH,     72 year old female presents the office today with a family member, sister for the above concerns.  Her sister stopped applying the bandages of the wounds of healed.  Denies any open lesions at this time.  Nails are elongated need to be trimmed.  No swelling redness or drainage.  No open lesions.  No other concerns.   No injuries during her falls to her lower extremities.  She is on Plavix.   Objective: AAO x3, NAD DP/PT pulses palpable bilaterally, CRT less than 3 seconds Protective sensation absent with Semmes Weinstein monofilament. Remaining nails are hypertrophic, dystrophic nail discoloration.  Nails affected are 1, 2, 4 on the left and 2 through 5 on the right.  No edema, erythema. On the area the previous wound of the left foot this is completely healed.  There is minimal callus.  There is no ongoing ulceration drainage or sign of infection.  No other lesions noted otherwise.  Digital contractures are noted. No pain with calf compression, swelling, warmth, erythema  Assessment: 72 year old female symptomatic onychomycosis, ulceration left foot-resolved  Plan: -All treatment options discussed with the patient including all alternatives, risks, complications.  -Sharply debrided the nails x 7 without any complications or bleeding. -Wound is resolved.  Continue offloading, moisturizer. -Follow up with PCP in regards to falls.   Return in about 9 weeks (around 03/09/2023).  Vivi Barrack DPM

## 2023-01-12 DIAGNOSIS — H43813 Vitreous degeneration, bilateral: Secondary | ICD-10-CM | POA: Diagnosis not present

## 2023-01-12 DIAGNOSIS — H34831 Tributary (branch) retinal vein occlusion, right eye, with macular edema: Secondary | ICD-10-CM | POA: Diagnosis not present

## 2023-01-12 DIAGNOSIS — H35033 Hypertensive retinopathy, bilateral: Secondary | ICD-10-CM | POA: Diagnosis not present

## 2023-01-19 ENCOUNTER — Ambulatory Visit (INDEPENDENT_AMBULATORY_CARE_PROVIDER_SITE_OTHER): Payer: PPO

## 2023-01-19 DIAGNOSIS — F332 Major depressive disorder, recurrent severe without psychotic features: Secondary | ICD-10-CM | POA: Diagnosis not present

## 2023-01-19 DIAGNOSIS — Z8673 Personal history of transient ischemic attack (TIA), and cerebral infarction without residual deficits: Secondary | ICD-10-CM

## 2023-01-19 LAB — CUP PACEART REMOTE DEVICE CHECK
Date Time Interrogation Session: 20241031003000
Implantable Pulse Generator Implant Date: 20240626
Pulse Gen Serial Number: 105871

## 2023-01-23 DIAGNOSIS — F331 Major depressive disorder, recurrent, moderate: Secondary | ICD-10-CM | POA: Diagnosis not present

## 2023-01-24 DIAGNOSIS — G4733 Obstructive sleep apnea (adult) (pediatric): Secondary | ICD-10-CM | POA: Diagnosis not present

## 2023-01-25 DIAGNOSIS — E669 Obesity, unspecified: Secondary | ICD-10-CM | POA: Diagnosis not present

## 2023-01-25 DIAGNOSIS — I129 Hypertensive chronic kidney disease with stage 1 through stage 4 chronic kidney disease, or unspecified chronic kidney disease: Secondary | ICD-10-CM | POA: Diagnosis not present

## 2023-01-25 DIAGNOSIS — Z7984 Long term (current) use of oral hypoglycemic drugs: Secondary | ICD-10-CM | POA: Diagnosis not present

## 2023-01-25 DIAGNOSIS — R296 Repeated falls: Secondary | ICD-10-CM | POA: Diagnosis not present

## 2023-01-25 DIAGNOSIS — N183 Chronic kidney disease, stage 3 unspecified: Secondary | ICD-10-CM | POA: Diagnosis not present

## 2023-01-25 DIAGNOSIS — F3341 Major depressive disorder, recurrent, in partial remission: Secondary | ICD-10-CM | POA: Diagnosis not present

## 2023-01-25 DIAGNOSIS — I700 Atherosclerosis of aorta: Secondary | ICD-10-CM | POA: Diagnosis not present

## 2023-01-25 DIAGNOSIS — Z6832 Body mass index (BMI) 32.0-32.9, adult: Secondary | ICD-10-CM | POA: Diagnosis not present

## 2023-01-25 DIAGNOSIS — Z794 Long term (current) use of insulin: Secondary | ICD-10-CM | POA: Diagnosis not present

## 2023-01-25 DIAGNOSIS — E1122 Type 2 diabetes mellitus with diabetic chronic kidney disease: Secondary | ICD-10-CM | POA: Diagnosis not present

## 2023-01-25 DIAGNOSIS — S0003XD Contusion of scalp, subsequent encounter: Secondary | ICD-10-CM | POA: Diagnosis not present

## 2023-01-25 DIAGNOSIS — D649 Anemia, unspecified: Secondary | ICD-10-CM | POA: Diagnosis not present

## 2023-02-01 NOTE — Progress Notes (Signed)
[facility] Summary Report / Loop Recorder 

## 2023-02-02 DIAGNOSIS — Z853 Personal history of malignant neoplasm of breast: Secondary | ICD-10-CM | POA: Diagnosis not present

## 2023-02-08 DIAGNOSIS — R399 Unspecified symptoms and signs involving the genitourinary system: Secondary | ICD-10-CM | POA: Diagnosis not present

## 2023-02-08 DIAGNOSIS — N390 Urinary tract infection, site not specified: Secondary | ICD-10-CM | POA: Diagnosis not present

## 2023-02-20 ENCOUNTER — Ambulatory Visit: Payer: PPO

## 2023-02-20 DIAGNOSIS — F331 Major depressive disorder, recurrent, moderate: Secondary | ICD-10-CM | POA: Diagnosis not present

## 2023-02-20 DIAGNOSIS — Z8673 Personal history of transient ischemic attack (TIA), and cerebral infarction without residual deficits: Secondary | ICD-10-CM | POA: Diagnosis not present

## 2023-02-20 LAB — CUP PACEART REMOTE DEVICE CHECK
Date Time Interrogation Session: 20241202003400
Implantable Pulse Generator Implant Date: 20240626
Pulse Gen Serial Number: 105871

## 2023-02-21 DIAGNOSIS — Z6832 Body mass index (BMI) 32.0-32.9, adult: Secondary | ICD-10-CM | POA: Diagnosis not present

## 2023-02-21 DIAGNOSIS — E669 Obesity, unspecified: Secondary | ICD-10-CM | POA: Diagnosis not present

## 2023-02-21 DIAGNOSIS — E1121 Type 2 diabetes mellitus with diabetic nephropathy: Secondary | ICD-10-CM | POA: Diagnosis not present

## 2023-02-21 DIAGNOSIS — I129 Hypertensive chronic kidney disease with stage 1 through stage 4 chronic kidney disease, or unspecified chronic kidney disease: Secondary | ICD-10-CM | POA: Diagnosis not present

## 2023-02-23 DIAGNOSIS — G4733 Obstructive sleep apnea (adult) (pediatric): Secondary | ICD-10-CM | POA: Diagnosis not present

## 2023-02-25 ENCOUNTER — Other Ambulatory Visit: Payer: Self-pay | Admitting: Physical Medicine and Rehabilitation

## 2023-02-27 DIAGNOSIS — Z9989 Dependence on other enabling machines and devices: Secondary | ICD-10-CM | POA: Diagnosis not present

## 2023-02-27 DIAGNOSIS — I129 Hypertensive chronic kidney disease with stage 1 through stage 4 chronic kidney disease, or unspecified chronic kidney disease: Secondary | ICD-10-CM | POA: Diagnosis not present

## 2023-02-27 DIAGNOSIS — I700 Atherosclerosis of aorta: Secondary | ICD-10-CM | POA: Diagnosis not present

## 2023-02-27 DIAGNOSIS — Z794 Long term (current) use of insulin: Secondary | ICD-10-CM | POA: Diagnosis not present

## 2023-02-27 DIAGNOSIS — E1122 Type 2 diabetes mellitus with diabetic chronic kidney disease: Secondary | ICD-10-CM | POA: Diagnosis not present

## 2023-02-27 DIAGNOSIS — D649 Anemia, unspecified: Secondary | ICD-10-CM | POA: Diagnosis not present

## 2023-02-27 DIAGNOSIS — N183 Chronic kidney disease, stage 3 unspecified: Secondary | ICD-10-CM | POA: Diagnosis not present

## 2023-02-27 DIAGNOSIS — Z9181 History of falling: Secondary | ICD-10-CM | POA: Diagnosis not present

## 2023-02-27 DIAGNOSIS — F3341 Major depressive disorder, recurrent, in partial remission: Secondary | ICD-10-CM | POA: Diagnosis not present

## 2023-03-08 DIAGNOSIS — E1142 Type 2 diabetes mellitus with diabetic polyneuropathy: Secondary | ICD-10-CM | POA: Diagnosis not present

## 2023-03-08 DIAGNOSIS — Z89411 Acquired absence of right great toe: Secondary | ICD-10-CM | POA: Diagnosis not present

## 2023-03-08 DIAGNOSIS — Z794 Long term (current) use of insulin: Secondary | ICD-10-CM | POA: Diagnosis not present

## 2023-03-09 ENCOUNTER — Ambulatory Visit: Payer: PPO | Admitting: Podiatry

## 2023-03-09 ENCOUNTER — Encounter: Payer: Self-pay | Admitting: Podiatry

## 2023-03-09 DIAGNOSIS — L03116 Cellulitis of left lower limb: Secondary | ICD-10-CM | POA: Diagnosis not present

## 2023-03-09 DIAGNOSIS — Z7901 Long term (current) use of anticoagulants: Secondary | ICD-10-CM

## 2023-03-09 DIAGNOSIS — M21619 Bunion of unspecified foot: Secondary | ICD-10-CM

## 2023-03-09 DIAGNOSIS — M79675 Pain in left toe(s): Secondary | ICD-10-CM | POA: Diagnosis not present

## 2023-03-09 DIAGNOSIS — M79674 Pain in right toe(s): Secondary | ICD-10-CM | POA: Diagnosis not present

## 2023-03-09 DIAGNOSIS — B351 Tinea unguium: Secondary | ICD-10-CM

## 2023-03-09 MED ORDER — DOXYCYCLINE HYCLATE 100 MG PO TABS: 100.0000 mg | ORAL_TABLET | Freq: Two times a day (BID) | ORAL | 0 refills | Status: DC

## 2023-03-09 NOTE — Progress Notes (Signed)
Subjective: Chief Complaint  Patient presents with   RFC    RM#13 rfc and sore on left top of toe.      72 year old female presents the office today with a family member, sister for the above concerns.  She presents today for elongated toenails that she is not able to trim her self but she also has a blister, sore noticed on the left bunion.  Started shortly after her last visit and self treatments at home.  No drainage or pus.  No injuries.  No fevers or chills.   She is on Plavix.   Objective: AAO x3, NAD DP/PT pulses palpable bilaterally, CRT less than 3 seconds Protective sensation absent with Semmes Weinstein monofilament. Remaining nails are hypertrophic, dystrophic nail discoloration.  Nails affected are 1, 2, 4 on the left and 2 through 5 on the right.  No edema, erythema. On the area the bunion the left foot appears to be an old blister which is dried.  There is minimal edema and some faint surrounding erythema without any ascending cellulitis.  There is no fluctuation or crepitation.  There is no malodor. No pain with calf compression, swelling, warmth, erythema  Assessment: 72 year old female symptomatic onychomycosis, preulcerative area/blister left foot  Plan: -All treatment options discussed with the patient including all alternatives, risks, complications.  -Sharply debrided the nails x 7 without any complications or bleeding. -Cleaned area of blister on the left foot.  Debrided any loose tissue today.  Mild localized erythema prescribed doxycycline.  Antibiotic ointment dressing to daily.  Offloading. Monitor for any clinical signs or symptoms of infection and directed to call the office immediately should any occur or go to the ER.  Return in about 3 weeks (around 03/30/2023) for blister, bunion.  Vivi Barrack DPM

## 2023-03-20 ENCOUNTER — Ambulatory Visit: Payer: PPO | Admitting: Neurology

## 2023-03-23 ENCOUNTER — Ambulatory Visit (INDEPENDENT_AMBULATORY_CARE_PROVIDER_SITE_OTHER): Payer: PPO

## 2023-03-23 DIAGNOSIS — Z8673 Personal history of transient ischemic attack (TIA), and cerebral infarction without residual deficits: Secondary | ICD-10-CM | POA: Diagnosis not present

## 2023-03-23 LAB — CUP PACEART REMOTE DEVICE CHECK
Date Time Interrogation Session: 20250102013800
Implantable Pulse Generator Implant Date: 20240626
Pulse Gen Serial Number: 105871

## 2023-04-06 ENCOUNTER — Ambulatory Visit (INDEPENDENT_AMBULATORY_CARE_PROVIDER_SITE_OTHER): Payer: PPO | Admitting: Podiatry

## 2023-04-06 ENCOUNTER — Encounter: Payer: Self-pay | Admitting: Podiatry

## 2023-04-06 DIAGNOSIS — M21619 Bunion of unspecified foot: Secondary | ICD-10-CM

## 2023-04-06 DIAGNOSIS — L97521 Non-pressure chronic ulcer of other part of left foot limited to breakdown of skin: Secondary | ICD-10-CM

## 2023-04-06 NOTE — Progress Notes (Signed)
Subjective: Chief Complaint  Patient presents with   Foot Ulcer    RM#14 Left foot ulcer follow up      73 year old female presents the office today with a family member, sister for the above concerns.  States that the wound itself is doing much better.  Denies any drainage or pus or increasing swelling or redness.  Bunion is present they report that this has been an ongoing issue.  She has been keeping BlastX on the ulcer. no fevers or chills.  No other concerns.  She is on Plavix.   Objective: AAO x3, NAD DP/PT pulses palpable bilaterally, CRT less than 3 seconds Protective sensation absent with Semmes Weinstein monofilament. Elevated left bunion is a small amount of scabbing, callus formation present.  Overall area is doing much better.  There is some pink skin around the area, there is new skin present with no erythema, drainage or pus or any increase in temperature or signs of infection. No malodor.  No pain with calf compression, swelling, warmth, erythema  Assessment: 73 year old female healing.  Wound left foot  Plan: -All treatment options discussed with the patient including all alternatives, risks, complications.  -Overall area is doing much better on the left foot and almost completely healed.  I continue with daily dressing changes for now until the rest of the small scab is, off.  Continue offloading.  Unfortunately bunions been ongoing issue for some time.  Long-term need to work on offloading to help event further irritation, skin breakdown. -Monitor for any clinical signs or symptoms of infection and directed to call the office immediately should any occur or go to the ER.  Return in about 6 weeks (around 05/18/2023).  Vivi Barrack DPM

## 2023-04-24 ENCOUNTER — Ambulatory Visit (INDEPENDENT_AMBULATORY_CARE_PROVIDER_SITE_OTHER): Payer: PPO

## 2023-04-24 DIAGNOSIS — I639 Cerebral infarction, unspecified: Secondary | ICD-10-CM | POA: Diagnosis not present

## 2023-04-24 LAB — CUP PACEART REMOTE DEVICE CHECK
Date Time Interrogation Session: 20250203004300
Implantable Pulse Generator Implant Date: 20240626
Pulse Gen Serial Number: 105871

## 2023-04-26 DIAGNOSIS — R3914 Feeling of incomplete bladder emptying: Secondary | ICD-10-CM | POA: Diagnosis not present

## 2023-04-28 ENCOUNTER — Encounter: Payer: Self-pay | Admitting: Cardiology

## 2023-05-03 NOTE — Progress Notes (Signed)
Carelink Summary Report / Loop Recorder

## 2023-05-24 DIAGNOSIS — G4733 Obstructive sleep apnea (adult) (pediatric): Secondary | ICD-10-CM | POA: Diagnosis not present

## 2023-05-25 ENCOUNTER — Ambulatory Visit: Payer: PPO

## 2023-05-25 DIAGNOSIS — I639 Cerebral infarction, unspecified: Secondary | ICD-10-CM | POA: Diagnosis not present

## 2023-05-27 LAB — CUP PACEART REMOTE DEVICE CHECK
Date Time Interrogation Session: 20250306014700
Implantable Pulse Generator Implant Date: 20240626
Pulse Gen Serial Number: 105871

## 2023-05-30 NOTE — Progress Notes (Signed)
 Bsx Loop Recorder

## 2023-05-31 ENCOUNTER — Emergency Department (HOSPITAL_COMMUNITY)
Admission: EM | Admit: 2023-05-31 | Discharge: 2023-05-31 | Disposition: A | Discharge status: Home / Self Care | Attending: Emergency Medicine | Admitting: Emergency Medicine

## 2023-05-31 ENCOUNTER — Encounter (HOSPITAL_COMMUNITY): Payer: Self-pay

## 2023-05-31 ENCOUNTER — Other Ambulatory Visit: Payer: Self-pay

## 2023-05-31 ENCOUNTER — Emergency Department (HOSPITAL_COMMUNITY)

## 2023-05-31 DIAGNOSIS — Z8673 Personal history of transient ischemic attack (TIA), and cerebral infarction without residual deficits: Secondary | ICD-10-CM | POA: Diagnosis not present

## 2023-05-31 DIAGNOSIS — N183 Chronic kidney disease, stage 3 unspecified: Secondary | ICD-10-CM | POA: Diagnosis not present

## 2023-05-31 DIAGNOSIS — R531 Weakness: Secondary | ICD-10-CM | POA: Diagnosis not present

## 2023-05-31 DIAGNOSIS — R600 Localized edema: Secondary | ICD-10-CM | POA: Insufficient documentation

## 2023-05-31 DIAGNOSIS — F3341 Major depressive disorder, recurrent, in partial remission: Secondary | ICD-10-CM | POA: Diagnosis not present

## 2023-05-31 DIAGNOSIS — I129 Hypertensive chronic kidney disease with stage 1 through stage 4 chronic kidney disease, or unspecified chronic kidney disease: Secondary | ICD-10-CM | POA: Insufficient documentation

## 2023-05-31 DIAGNOSIS — I700 Atherosclerosis of aorta: Secondary | ICD-10-CM | POA: Diagnosis not present

## 2023-05-31 DIAGNOSIS — Z8542 Personal history of malignant neoplasm of other parts of uterus: Secondary | ICD-10-CM | POA: Diagnosis not present

## 2023-05-31 DIAGNOSIS — Z0000 Encounter for general adult medical examination without abnormal findings: Secondary | ICD-10-CM | POA: Diagnosis not present

## 2023-05-31 DIAGNOSIS — I959 Hypotension, unspecified: Secondary | ICD-10-CM | POA: Diagnosis not present

## 2023-05-31 DIAGNOSIS — N189 Chronic kidney disease, unspecified: Secondary | ICD-10-CM | POA: Insufficient documentation

## 2023-05-31 DIAGNOSIS — I6782 Cerebral ischemia: Secondary | ICD-10-CM | POA: Diagnosis not present

## 2023-05-31 DIAGNOSIS — R55 Syncope and collapse: Secondary | ICD-10-CM | POA: Diagnosis not present

## 2023-05-31 DIAGNOSIS — G4733 Obstructive sleep apnea (adult) (pediatric): Secondary | ICD-10-CM | POA: Diagnosis not present

## 2023-05-31 DIAGNOSIS — Z7984 Long term (current) use of oral hypoglycemic drugs: Secondary | ICD-10-CM | POA: Diagnosis not present

## 2023-05-31 DIAGNOSIS — Z89421 Acquired absence of other right toe(s): Secondary | ICD-10-CM | POA: Diagnosis not present

## 2023-05-31 DIAGNOSIS — E1121 Type 2 diabetes mellitus with diabetic nephropathy: Secondary | ICD-10-CM | POA: Diagnosis not present

## 2023-05-31 DIAGNOSIS — E1122 Type 2 diabetes mellitus with diabetic chronic kidney disease: Secondary | ICD-10-CM | POA: Diagnosis not present

## 2023-05-31 DIAGNOSIS — N3000 Acute cystitis without hematuria: Secondary | ICD-10-CM | POA: Insufficient documentation

## 2023-05-31 DIAGNOSIS — M542 Cervicalgia: Secondary | ICD-10-CM | POA: Diagnosis not present

## 2023-05-31 DIAGNOSIS — Z79899 Other long term (current) drug therapy: Secondary | ICD-10-CM | POA: Diagnosis not present

## 2023-05-31 DIAGNOSIS — Z7902 Long term (current) use of antithrombotics/antiplatelets: Secondary | ICD-10-CM | POA: Diagnosis not present

## 2023-05-31 DIAGNOSIS — Z853 Personal history of malignant neoplasm of breast: Secondary | ICD-10-CM | POA: Diagnosis not present

## 2023-05-31 DIAGNOSIS — I10 Essential (primary) hypertension: Secondary | ICD-10-CM | POA: Diagnosis not present

## 2023-05-31 DIAGNOSIS — R0902 Hypoxemia: Secondary | ICD-10-CM | POA: Diagnosis not present

## 2023-05-31 DIAGNOSIS — Z794 Long term (current) use of insulin: Secondary | ICD-10-CM | POA: Diagnosis not present

## 2023-05-31 DIAGNOSIS — E559 Vitamin D deficiency, unspecified: Secondary | ICD-10-CM | POA: Diagnosis not present

## 2023-05-31 DIAGNOSIS — M549 Dorsalgia, unspecified: Secondary | ICD-10-CM | POA: Diagnosis not present

## 2023-05-31 LAB — COMPREHENSIVE METABOLIC PANEL
ALT: 16 U/L (ref 0–44)
AST: 15 U/L (ref 15–41)
Albumin: 3.2 g/dL — ABNORMAL LOW (ref 3.5–5.0)
Alkaline Phosphatase: 41 U/L (ref 38–126)
Anion gap: 12 (ref 5–15)
BUN: 31 mg/dL — ABNORMAL HIGH (ref 8–23)
CO2: 21 mmol/L — ABNORMAL LOW (ref 22–32)
Calcium: 9.3 mg/dL (ref 8.9–10.3)
Chloride: 105 mmol/L (ref 98–111)
Creatinine, Ser: 1.83 mg/dL — ABNORMAL HIGH (ref 0.44–1.00)
GFR, Estimated: 29 mL/min — ABNORMAL LOW (ref >60)
Glucose, Bld: 110 mg/dL — ABNORMAL HIGH (ref 70–99)
Potassium: 4.5 mmol/L (ref 3.5–5.1)
Sodium: 138 mmol/L (ref 135–145)
Total Bilirubin: 0.6 mg/dL (ref 0.0–1.2)
Total Protein: 6.7 g/dL (ref 6.5–8.1)

## 2023-05-31 LAB — TROPONIN I (HIGH SENSITIVITY)
Troponin I (High Sensitivity): 4 ng/L (ref <18)
Troponin I (High Sensitivity): 3 ng/L (ref <18)

## 2023-05-31 LAB — URINALYSIS, ROUTINE W REFLEX MICROSCOPIC
Bilirubin Urine: NEGATIVE
Glucose, UA: NEGATIVE mg/dL
Hgb urine dipstick: NEGATIVE
Ketones, ur: NEGATIVE mg/dL
Nitrite: NEGATIVE
Protein, ur: NEGATIVE mg/dL
Specific Gravity, Urine: 1.003 — ABNORMAL LOW (ref 1.005–1.030)
WBC, UA: >50 WBC/hpf (ref 0–5)
pH: 5 (ref 5.0–8.0)

## 2023-05-31 LAB — CBC WITH DIFFERENTIAL/PLATELET
Abs Immature Granulocytes: 0.07 10*3/uL (ref 0.00–0.07)
Basophils Absolute: 0 10*3/uL (ref 0.0–0.1)
Basophils Relative: 1 %
Eosinophils Absolute: 0.1 10*3/uL (ref 0.0–0.5)
Eosinophils Relative: 1 %
HCT: 31.7 % — ABNORMAL LOW (ref 36.0–46.0)
Hemoglobin: 10.1 g/dL — ABNORMAL LOW (ref 12.0–15.0)
Immature Granulocytes: 1 %
Lymphocytes Relative: 26 %
Lymphs Abs: 1.8 10*3/uL (ref 0.7–4.0)
MCH: 31.7 pg (ref 26.0–34.0)
MCHC: 31.9 g/dL (ref 30.0–36.0)
MCV: 99.4 fL (ref 80.0–100.0)
Monocytes Absolute: 0.6 10*3/uL (ref 0.1–1.0)
Monocytes Relative: 9 %
Neutro Abs: 4.4 10*3/uL (ref 1.7–7.7)
Neutrophils Relative %: 62 %
Platelets: 223 10*3/uL (ref 150–400)
RBC: 3.19 MIL/uL — ABNORMAL LOW (ref 3.87–5.11)
RDW: 14.3 % (ref 11.5–15.5)
WBC: 7 10*3/uL (ref 4.0–10.5)
nRBC: 0 % (ref 0.0–0.2)

## 2023-05-31 LAB — LIPASE, BLOOD: Lipase: 46 U/L (ref 11–51)

## 2023-05-31 LAB — D-DIMER, QUANTITATIVE: D-Dimer, Quant: 0.48 ug{FEU}/mL (ref 0.00–0.50)

## 2023-05-31 LAB — CBG MONITORING, ED: Glucose-Capillary: 99 mg/dL (ref 70–99)

## 2023-05-31 MED ORDER — CEPHALEXIN 250 MG PO CAPS: 250.0000 mg | ORAL_CAPSULE | Freq: Three times a day (TID) | ORAL | 0 refills | Status: AC

## 2023-05-31 MED ORDER — CEPHALEXIN 250 MG PO CAPS
250.0000 mg | ORAL_CAPSULE | Freq: Once | ORAL | Status: AC
  Administered 2023-05-31: 250 mg via ORAL
  Filled 2023-05-31: qty 1

## 2023-05-31 MED ORDER — SODIUM CHLORIDE 0.9 % IV BOLUS
500.0000 mL | Freq: Once | INTRAVENOUS | Status: AC
  Administered 2023-05-31: 500 mL via INTRAVENOUS

## 2023-05-31 NOTE — ED Notes (Signed)
 PT ambulated to the bathroom and back with minimal assistance.

## 2023-05-31 NOTE — ED Notes (Signed)
 PT is eating a sandwich and diet drink at bedside

## 2023-05-31 NOTE — ED Triage Notes (Addendum)
 PT BIB [facility] from physicians office after being there for a routine well check visit. PT had a near syncopal episode where she also experienced neck and back pain with a sudden visual change where the floor looked pink and surrounding areas green. PT does have a hx of retinal injections, since her arrival to ED, retinal issues have since resolved. PT was hypotensive 80/50's both  automatic and manually. Left arm is restricted and PT is a+ox4.

## 2023-05-31 NOTE — ED Notes (Signed)
 Called and placed PT on monitor with CCMD.

## 2023-05-31 NOTE — Discharge Instructions (Signed)
 Take the antibiotics as prescribed to treat the urinary tract infection.  Blood test today were otherwise reassuring.  Return to the ED for fevers chills worsening symptoms.  Follow-up with your doctor to be rechecked to make sure the infection resolves.

## 2023-05-31 NOTE — ED Provider Notes (Signed)
 [place] EMERGENCY DEPARTMENT AT [facility] Provider Note   CSN: [id number] Arrival date & time: 05/31/23  1706     History  Chief Complaint  Patient presents with   Near Syncope    [name] is a 73 y.o. female.   Near Syncope     Patient has a history of hyperlipidemia hypertension diabetes retinopathy chronic kidney disease, depression, stroke sleep apnea breast cancer endometrial carcinoma who presents ED for evaluation of near syncope hypotension.  Patient states she was at the doctor's office.  She started to feel that she was going to pass out.  Patient was experiencing pain in her neck and back when that occurred.  She states she does have history of chronic back pain.  She noticed that when she was looking at the floor change color she felt like she was going to pass out.  Patient was found to be hypotensive with blood pressures in the 80 over 50s.  EMS was called and she was brought to the ED for further evaluation.  Patient states she is not currently having any headache.  No chest pain.  No abdominal pain.  No focal numbness or weakness  Home Medications Prior to Admission medications   Medication Sig Start Date End Date Taking? Authorizing Provider  cephALEXin (KEFLEX) 250 MG capsule Take 1 capsule (250 mg total) by mouth 3 (three) times daily for 7 days. 05/31/23 06/07/23 Yes Linwood Dibbles, MD  acetaminophen (TYLENOL) 325 MG tablet Take 2 tablets (650 mg total) by mouth every 4 (four) hours as needed for mild pain (or temp > 37.5 C (99.5 F)). 09/27/22   Angiulli, Mcarthur Rossetti, PA-C  amLODipine (NORVASC) 5 MG tablet Take 5 mg by mouth daily.    [provider]  ascorbic acid (VITAMIN C) 500 MG tablet Take 1 tablet (500 mg total) by mouth daily. 08/24/22   Angiulli, Mcarthur Rossetti, PA-C  atorvastatin (LIPITOR) 80 MG tablet Take 1 tablet (80 mg total) by mouth daily. 09/29/22   Angiulli, Mcarthur Rossetti, PA-C  buPROPion (WELLBUTRIN XL) 150 MG 24 hr tablet Take 1 tablet  (150 mg total) by mouth daily. 11/02/22   [name], DO  carvedilol (COREG) 3.125 MG tablet Take 1 tablet (3.125 mg total) by mouth 2 (two) times daily. 09/29/22   Angiulli, Mcarthur Rossetti, PA-C  Cholecalciferol 125 MCG (5000 UT) TABS Take 1 tablet (5,000 Units total) by mouth every evening for 30 days then as directed by MD 09/29/22   Angiulli, Mcarthur Rossetti, PA-C  clopidogrel (PLAVIX) 75 MG tablet Take 1 tablet (75 mg total) by mouth daily. 09/29/22   Angiulli, Mcarthur Rossetti, PA-C  Continuous Glucose Sensor (FREESTYLE LIBRE 2 SENSOR) MISC Apply new sensor every 14 days to monitor blood glucose. Remove old sensor before applying new one. 10/10/22   [provider]  diclofenac Sodium (VOLTAREN) 1 % GEL Apply 4 g topically 4 (four) times daily. 09/29/22   Angiulli, Mcarthur Rossetti, PA-C  doxycycline (VIBRA-TABS) 100 MG tablet Take 1 tablet (100 mg total) by mouth 2 (two) times daily. 03/09/23   Vivi Barrack, DPM  escitalopram (LEXAPRO) 20 MG tablet Take 1 tablet (20 mg total) by mouth daily. 09/29/22   Angiulli, Mcarthur Rossetti, PA-C  insulin aspart protamine- aspart (NOVOLOG MIX 70/30) (70-30) 100 UNIT/ML injection Inject 0.25 mLs (25 Units total) into the skin 2 (two) times daily with a meal. 09/29/22   Angiulli, Mcarthur Rossetti, PA-C  losartan (COZAAR) 25 MG tablet Take 1  tablet (25 mg total) by mouth daily. 09/29/22   Angiulli, Mcarthur Rossetti, PA-C  metFORMIN (GLUCOPHAGE) 500 MG tablet Take 1 tablet (500 mg total) by mouth 2 (two) times daily with a meal. 09/29/22   Angiulli, Mcarthur Rossetti, PA-C  mirtazapine (REMERON) 7.5 MG tablet Take 7.5 mg by mouth at bedtime. 12/23/22   [provider]  tamsulosin (FLOMAX) 0.4 MG CAPS capsule Take 2 capsules (0.8 mg total) by mouth daily after supper. 11/02/22   [name], DO  Zinc Sulfate 220 (50 Zn) MG TABS Take 1 tablet (220 mg total) by mouth daily for 30 days then as directed by MD 09/29/22   Angiulli, Mcarthur Rossetti, PA-C      Allergies    Elavil [amitriptyline], Pristiq  [desvenlafaxine], Cipro [ciprofloxacin hcl], Penicillins, Sulfa antibiotics, Sulfamethoxazole, and Sulfur    Review of Systems   Review of Systems  Cardiovascular:  Positive for near-syncope.    Physical Exam Updated Vital Signs BP (!) 110/57 (BP Location: Right Arm)   Pulse 67   Temp 98.6 F (37 C) (Oral)   Resp 20   Ht 1.753 m (5\' 9" )   Wt 99.3 kg   SpO2 98%   BMI 32.33 kg/m  Physical Exam Vitals and nursing note reviewed.  Constitutional:      Appearance: She is well-developed. She is ill-appearing.     Comments: Listless   HENT:     Head: Normocephalic and atraumatic.     Right Ear: External ear normal.     Left Ear: External ear normal.  Eyes:     General: No scleral icterus.       Right eye: No discharge.        Left eye: No discharge.     Conjunctiva/sclera: Conjunctivae normal.  Neck:     Trachea: No tracheal deviation.  Cardiovascular:     Rate and Rhythm: Normal rate and regular rhythm.  Pulmonary:     Effort: Pulmonary effort is normal. No respiratory distress.     Breath sounds: Normal breath sounds. No stridor. No wheezing or rales.  Abdominal:     General: Bowel sounds are normal. There is no distension.     Palpations: Abdomen is soft.     Tenderness: There is no abdominal tenderness. There is no guarding or rebound.  Musculoskeletal:        General: No tenderness or deformity.     Cervical back: Normal range of motion and neck supple. No rigidity.     Right lower leg: Edema present.     Left lower leg: Edema present.     Comments: No spinal ttp  Skin:    General: Skin is warm and dry.     Findings: No rash.  Neurological:     General: No focal deficit present.     Cranial Nerves: No cranial nerve deficit, dysarthria or facial asymmetry.     Sensory: No sensory deficit.     Motor: No weakness, tremor, abnormal muscle tone or seizure activity.     Coordination: Coordination normal.     Comments: Alert, answers questions appropriately, soft  spoken,   Psychiatric:        Mood and Affect: Mood normal.     ED Results / Procedures / Treatments   Labs (all labs ordered are listed, but only abnormal results are displayed) Labs Reviewed  CBC WITH DIFFERENTIAL/PLATELET - Abnormal; Notable for the following components:      Result Value   RBC 3.19 (*)  Hemoglobin 10.1 (*)    HCT 31.7 (*)    All other components within normal limits  COMPREHENSIVE METABOLIC PANEL - Abnormal; Notable for the following components:   CO2 21 (*)    Glucose, Bld 110 (*)    BUN 31 (*)    Creatinine, Ser 1.83 (*)    Albumin 3.2 (*)    GFR, Estimated 29 (*)    All other components within normal limits  URINALYSIS, ROUTINE W REFLEX MICROSCOPIC - Abnormal; Notable for the following components:   APPearance CLOUDY (*)    Specific Gravity, Urine 1.003 (*)    Leukocytes,Ua LARGE (*)    Bacteria, UA RARE (*)    All other components within normal limits  URINE CULTURE  LIPASE, BLOOD  D-DIMER, QUANTITATIVE  CBG MONITORING, ED  TROPONIN I (HIGH SENSITIVITY)  TROPONIN I (HIGH SENSITIVITY)    EKG EKG Interpretation Date/Time:  Wednesday May 31 2023 18:06:46 EDT Ventricular Rate:  61 PR Interval:  171 QRS Duration:  106 QT Interval:  452 QTC Calculation: 456 R Axis:   6  Text Interpretation: Sinus rhythm No significant change since last tracing Confirmed by Linwood Dibbles [phone]) on 05/31/2023 6:15:50 PM  Radiology CT Head Wo Contrast Result Date: 05/31/2023 CLINICAL DATA:  Near syncope EXAM: CT HEAD WITHOUT CONTRAST TECHNIQUE: Contiguous axial images were obtained from the base of the skull through the vertex without intravenous contrast. RADIATION DOSE REDUCTION: This exam was performed according to the departmental dose-optimization program which includes automated exposure control, adjustment of the mA and/or kV according to patient size and/or use of iterative reconstruction technique. COMPARISON:  CT brain 12/26/2022 FINDINGS: Brain: No acute  territorial infarction, hemorrhage or intracranial mass. Atrophy and chronic small vessel ischemic changes of the white matter. Multiple chronic infarcts involving the left parietal lobe, left basal ganglia, and bilateral posterior frontal lobes. Nonenlarged ventricles. Vascular: No hyperdense vessels.  Carotid vascular calcification Skull: Normal. Negative for fracture or focal lesion. Sinuses/Orbits: No acute finding. Other: None IMPRESSION: 1. No CT evidence for acute intracranial abnormality. 2. Atrophy and chronic small vessel ischemic changes of the white matter. Multiple chronic infarcts. Electronically Signed   By: Jasmine Pang M.D.   On: 05/31/2023 20:52   DG Chest Portable 1 View Result Date: 05/31/2023 CLINICAL DATA:  Weakness, near syncope. EXAM: PORTABLE CHEST 1 VIEW COMPARISON:  November 27, 2022. FINDINGS: The heart size and mediastinal contours are within normal limits. Both lungs are clear. The visualized skeletal structures are unremarkable. IMPRESSION: No active disease. Electronically Signed   By: Lupita Raider M.D.   On: 05/31/2023 20:26    Procedures Procedures    Medications Ordered in ED Medications  cephALEXin (KEFLEX) capsule 250 mg (has no administration in time range)  sodium chloride 0.9 % bolus 500 mL (0 mLs Intravenous Stopped 05/31/23 2150)    ED Course/ Medical Decision Making/ A&P Clinical Course as of 05/31/23 2220  Wed May 31, 2023  1943 CBC WITH DIFFERENTIAL(!) [JK]  1943 CBC shows hemoglobin of 10, similar to previous. [JK]  2106 Head CT without acute abnormalities multiple prior infarcts noted. [JK]  2107 Chest x-ray without acute findings. [JK]  2107 Troponin normal lipase normal. [JK]  2115 GFR, Estimated(!): 29 [JK]  2147 Patient able to ambulate in the ED.  Back to her baseline [JK]  2216 Urinalysis, Routine w reflex microscopic -Urine, Clean Catch(!) Urinalysis suggestive of urinary tract infection [JK]    Clinical Course User Index [JK]  Linwood Dibbles, MD  Medical Decision Making Problems Addressed: Acute cystitis without hematuria: acute illness or injury that poses a threat to life or bodily functions Near syncope: acute illness or injury that poses a threat to life or bodily functions  Amount and/or Complexity of Data Reviewed Labs: ordered. Decision-making details documented in ED Course. Radiology: ordered and independent interpretation performed.  Risk Prescription drug management.   Patient presented to the ED for evaluation of a near syncopal episode.  Patient was at her doctor's office when she had an episode of low blood pressure.  Patient states she does have history of orthostatic hypotension.  Patient did experience some back pain while she was there.  Consider the possibility of PE ACS.  Pneumonia.  Patient is not having abdominal pain.  She has had some urinary frequency.  Signs of systemic infection.  Metabolic panel lipase troponin is all reassuring.  D-dimer negative doubt PE.  Patient has had some urinary frequency.  Patient's urinalysis does suggest urinary tract infection but she has no fever or leukocytosis.  I doubt systemic infection.  Will discharge home with course of oral antibiotics.        Final Clinical Impression(s) / ED Diagnoses Final diagnoses:  Near syncope  Acute cystitis without hematuria    Rx / DC Orders ED Discharge Orders          Ordered    cephALEXin (KEFLEX) 250 MG capsule  3 times daily        05/31/23 2218              Linwood Dibbles, MD 05/31/23 2220

## 2023-06-01 ENCOUNTER — Encounter: Payer: Self-pay | Admitting: Cardiology

## 2023-06-04 LAB — URINE CULTURE: Culture: >=100000 — AB

## 2023-06-06 DIAGNOSIS — Z87440 Personal history of urinary (tract) infections: Secondary | ICD-10-CM | POA: Diagnosis not present

## 2023-06-06 DIAGNOSIS — F3341 Major depressive disorder, recurrent, in partial remission: Secondary | ICD-10-CM | POA: Diagnosis not present

## 2023-06-06 DIAGNOSIS — I129 Hypertensive chronic kidney disease with stage 1 through stage 4 chronic kidney disease, or unspecified chronic kidney disease: Secondary | ICD-10-CM | POA: Diagnosis not present

## 2023-06-06 DIAGNOSIS — Z8673 Personal history of transient ischemic attack (TIA), and cerebral infarction without residual deficits: Secondary | ICD-10-CM | POA: Diagnosis not present

## 2023-06-06 DIAGNOSIS — N183 Chronic kidney disease, stage 3 unspecified: Secondary | ICD-10-CM | POA: Diagnosis not present

## 2023-06-08 ENCOUNTER — Ambulatory Visit: Payer: PPO | Attending: Internal Medicine | Admitting: Internal Medicine

## 2023-06-08 ENCOUNTER — Encounter: Payer: Self-pay | Admitting: Internal Medicine

## 2023-06-08 VITALS — BP 86/38 | HR 62 | Ht 69.5 in | Wt 224.0 lb

## 2023-06-08 DIAGNOSIS — I951 Orthostatic hypotension: Secondary | ICD-10-CM | POA: Diagnosis not present

## 2023-06-08 DIAGNOSIS — I639 Cerebral infarction, unspecified: Secondary | ICD-10-CM

## 2023-06-08 DIAGNOSIS — Z8673 Personal history of transient ischemic attack (TIA), and cerebral infarction without residual deficits: Secondary | ICD-10-CM

## 2023-06-08 NOTE — Patient Instructions (Signed)
 Medication Instructions:  STOP amlodipine  Check your BPs at home and report readings via MyChart  *If you need a refill on your cardiac medications before your next appointment, please call your pharmacy*  Follow-Up: At [facility], you and your health needs are our priority.  As part of our continuing mission to provide you with exceptional heart care, we have created designated Provider Care Teams.  These Care Teams include your primary Cardiologist (physician) and Advanced Practice Providers (APPs -  Physician Assistants and Nurse Practitioners) who all work together to provide you with the care you need, when you need it.  We recommend signing up for the patient portal called "MyChart".  Sign up information is provided on this After Visit Summary.  MyChart is used to connect with patients for Virtual Visits (Telemedicine).  Patients are able to view lab/test results, encounter notes, upcoming appointments, etc.  Non-urgent messages can be sent to your provider as well.   To learn more about what you can do with MyChart, go to [email or web address].    Your next appointment:    1 month with PA or NP  Other Instructions   1st Floor: - Lobby - Registration  - Pharmacy  - Lab - Cafe  2nd Floor: - PV Lab - Diagnostic Testing (echo, CT, nuclear med)  3rd Floor: - Vacant  4th Floor: - TCTS (cardiothoracic surgery) - AFib Clinic - Structural Heart Clinic - Vascular Surgery  - Vascular Ultrasound  5th Floor: - [facility] Cardiology (general and EP) - Clinical Pharmacy for coumadin, hypertension, lipid, weight-loss medications, and med management appointments    Valet parking services will be available as well.

## 2023-06-08 NOTE — Progress Notes (Signed)
 OFFICE CONSULT NOTE  Chief Complaint:  Palpitations, history of Takatsubo's cardiomyopathy  Primary Care Physician: Laurann Montana, MD  HPI:  [name] is a 73 y.o. female who is being seen today for the evaluation of palpitations at the request of Laurann Montana, MD.  [name] is referred today for evaluation of palpitations and tachycardia.  She reports episodes of racing heartbeat at night, 3 of which she can recall specifically, however they occurred last December and she has not had any more episodes in the past 5 months.  She has remote history of Takatsubo cardiomyopathy.  Apparently this was a diagnosis she received in 2009 when she presented with chest pain.  She says she had a heart catheterization in 2009 and was told she did not have any coronary disease.  She felt that this was at [facility], but I cannot find records.  I can also not find records of her prior echocardiogram.  She said she saw a cardiologist at [facility], but he is no longer practicing there.  She denies any chest pain or worsening shortness of breath.  Recent labs were reviewed from February 2019 which showed total cholesterol 166, HDL 48, LDL 80 and triglycerides 190.  Hemoglobin A1c was 6.4.  04/19/2019  [name] returns today for follow-up.  I last saw her via telemedicine visit.  She was having some worsening edema and I increased her Lasix.  Since then there is been additional weight gain from 268 up to 287 pounds.  She still struggling with edema and some neuropathy.  She has a upcoming appointment with Dr. Karel Jarvis in February with neurology.  Recently she has had some labile blood pressures.  In fact her blood pressure has been elevated and heart rate as well.  She has been undergoing injections of her knee with minimal benefit.  She had been in discussions with the clinical pharmacist at our practice about possibly changing her medicines, but no adjustment had been made.  07/19/2019  [name] is  seen today in follow-up.  There has been some improvement in her edema as well as some additional weight loss.  Palpitations have improved with increase in beta-blocker as well and her blood pressure was normal today 121/65.  Other times it seems to be elevated she says but I do feel like there is been interval improvement.  01/23/2020  [name] returns for follow-up of hypertension/edema.  Overall she notes continued improvement in this.  Blood pressure control has been excellent.  Unfortunately she developed an ulcer on her left foot in the area of a bunionectomy.  She has been in a recovery boot for that.  Moreover she recently was diagnosed with a breast cancer.  She will need upcoming surgery for this.  She denies any heart failure symptoms.  She has no active palpitations.  06/08/2023  [name] is seen today for follow-up.  I last saw her in 2021.  More recently she saw Carlos Levering, NP and Dr. Steffanie Dunn after suffering a TIA and/or stroke.  She had a loop recorder placed.  So far this has not shown any evidence of any atrial fibrillation.  She has been having issues with orthostatic hypotension.  She was just hospitalized a few days ago with this and noted to have blood pressures of 80s over 50s.  Her PCP had recommended against taking amlodipine or certain medicines of her blood pressure was below a certain threshold.  At home blood pressure readings seem  to be mostly in the low 100s to 110 systolic.  Today in the office she was 86/38 and did appear somewhat sluggish and symptomatic with a lower blood pressure.  I reviewed her med list that she is on 3 different blood pressure medicines including low-dose carvedilol, losartan 25 mg daily and amlodipine 5 mg daily.  PMHx:  Past Medical History:  Diagnosis Date   AKI (acute kidney injury) (HCC) 08/09/2022   Breast cancer (HCC) 12/2019   left breast DCIS   CKD (chronic kidney disease) 06/07/2016   Stage 2, GFR 60-89 ml/min    Diabetic polyneuropathy 06/07/2016   Dyslipidemia    Endometrial adenocarcinoma 2012   Fever blister    Hyperlipidemia    Hypertension, essential 06/07/2016   Major depressive disorder    Mixed dyslipidemia 06/07/2016   MRSA infection 10/2019   left foot wound   Obstructive sleep apnea 06/07/2016   does not use CPAP   Peripheral neuropathy    Retinopathy    Right rib fracture 08/09/2022   Severe obesity    Skin ulcer of left foot with fat layer exposed 11/02/2016   Stroke    Asymptomatic, discovered via neuroimaging; small infarct in left parietal lobe; also concern for small b/l frontal infarcts   Tachycardia 07/19/2017   Traumatic rhabdomyolysis (HCC) 08/09/2022   Type 2 diabetes mellitus with hyperglycemia, with long-term current use of insulin 06/07/2016   Unilateral primary osteoarthritis, right knee 04/16/2019   UTI (urinary tract infection) 08/09/2022    Past Surgical History:  Procedure Laterality Date   ADRENALECTOMY     BREAST LUMPECTOMY WITH RADIOACTIVE SEED LOCALIZATION Left 02/25/2020   Procedure: LEFT BREAST LUMPECTOMY WITH RADIOACTIVE SEED LOCALIZATION;  Surgeon: Abigail Miyamoto, MD;  Location: [facility];  Service: General;  Laterality: Left;   BUNIONECTOMY     CARDIAC CATHETERIZATION     CERVICAL SPINE SURGERY     CHOLECYSTECTOMY     DILATION AND CURETTAGE OF UTERUS     LAPAROSCOPIC SALPINGOOPHERECTOMY     LOOP RECORDER INSERTION N/A 09/14/2022   Procedure: LOOP RECORDER INSERTION;  Surgeon: Lanier Prude, MD;  Location: [facility];  Service: Cardiovascular;  Laterality: N/A;   THORACOTOMY     TONSILLECTOMY      FAMHx:  Family History  Problem Relation Age of Onset   Hypertension Mother    Alzheimer's disease Mother    Heart attack Father    CAD Father    Alzheimer's disease Father    Bladder Cancer Father        dx late 66s   Diverticulitis Sister    Obesity Sister    Hypertension Sister    Voice disorder Brother     Breast cancer Paternal Aunt 90   Prostate cancer Paternal Uncle        dx mid 32s    SOCHx:   reports that she has never smoked. She has never used smokeless tobacco. She reports that she does not drink alcohol and does not use drugs.  ALLERGIES:  Allergies  Allergen Reactions   Elavil [Amitriptyline] Other (See Comments)    Unknown reaction   Pristiq [Desvenlafaxine] Other (See Comments)    Unknown allergy   Cipro [Ciprofloxacin Hcl] Rash   Penicillins Rash   Sulfa Antibiotics Rash   Sulfamethoxazole Rash   Sulfur Rash    ROS: Pertinent items noted in HPI and remainder of comprehensive ROS otherwise negative.  HOME MEDS: Current Outpatient Medications on File Prior to Visit  Medication Sig Dispense Refill   acetaminophen (TYLENOL) 325 MG tablet Take 2 tablets (650 mg total) by mouth every 4 (four) hours as needed for mild pain (or temp > 37.5 C (99.5 F)).     amLODipine (NORVASC) 5 MG tablet Take 5 mg by mouth daily.     ascorbic acid (VITAMIN C) 500 MG tablet Take 1 tablet (500 mg total) by mouth daily. 100 tablet 0   atorvastatin (LIPITOR) 80 MG tablet Take 1 tablet (80 mg total) by mouth daily. 30 tablet 0   carvedilol (COREG) 3.125 MG tablet Take 1 tablet (3.125 mg total) by mouth 2 (two) times daily. 60 tablet 0   CEPHALEXIN PO Take by mouth.     Cholecalciferol 125 MCG (5000 UT) TABS Take 1 tablet (5,000 Units total) by mouth every evening for 30 days then as directed by MD 100 tablet 0   clopidogrel (PLAVIX) 75 MG tablet Take 1 tablet (75 mg total) by mouth daily. 30 tablet 0   Continuous Glucose Sensor (FREESTYLE LIBRE 2 SENSOR) MISC Apply new sensor every 14 days to monitor blood glucose. Remove old sensor before applying new one.     diclofenac Sodium (VOLTAREN) 1 % GEL Apply 4 g topically 4 (four) times daily. 400 g 0   escitalopram (LEXAPRO) 20 MG tablet Take 1 tablet (20 mg total) by mouth daily. 30 tablet 0   insulin aspart protamine- aspart (NOVOLOG MIX  70/30) (70-30) 100 UNIT/ML injection Inject 0.25 mLs (25 Units total) into the skin 2 (two) times daily with a meal. 10 mL 11   losartan (COZAAR) 25 MG tablet Take 1 tablet (25 mg total) by mouth daily. 30 tablet 0   metFORMIN (GLUCOPHAGE) 500 MG tablet Take 1 tablet (500 mg total) by mouth 2 (two) times daily with a meal. 60 tablet 0   mirtazapine (REMERON) 7.5 MG tablet Take 7.5 mg by mouth at bedtime.     PREBIOTIC PRODUCT PO Take by mouth.     Zinc Sulfate 220 (50 Zn) MG TABS Take 1 tablet (220 mg total) by mouth daily for 30 days then as directed by MD 100 tablet 0   buPROPion (WELLBUTRIN XL) 150 MG 24 hr tablet Take 1 tablet (150 mg total) by mouth daily. (Patient not taking: Reported on 06/08/2023) 30 tablet 3   doxycycline (VIBRA-TABS) 100 MG tablet Take 1 tablet (100 mg total) by mouth 2 (two) times daily. (Patient not taking: Reported on 06/08/2023) 20 tablet 0   tamsulosin (FLOMAX) 0.4 MG CAPS capsule Take 2 capsules (0.8 mg total) by mouth daily after supper. (Patient not taking: Reported on 06/08/2023) 60 capsule 2   No current facility-administered medications on file prior to visit.    LABS/IMAGING: No results found for this or any previous visit (from the past 48 hours). No results found.  LIPID PANEL:    Component Value Date/Time   CHOL 121 08/09/2022 0328   TRIG 144 08/09/2022 0328   HDL 34 (L) 08/09/2022 0328   CHOLHDL 3.6 08/09/2022 0328   VLDL 29 08/09/2022 0328   LDLCALC 58 08/09/2022 0328    WEIGHTS: Wt Readings from Last 3 Encounters:  06/08/23 224 lb (101.6 kg)  05/31/23 218 lb 14.7 oz (99.3 kg)  12/26/22 219 lb (99.3 kg)    VITALS: BP (!) 86/38   Pulse 62   Ht 5' 9.5" (1.765 m)   Wt 224 lb (101.6 kg)   SpO2 99%   BMI 32.60 kg/m   EXAM: General  appearance: alert, no distress and moderately obese Neck: no carotid bruit, no JVD and thyroid not enlarged, symmetric, no tenderness/mass/nodules Lungs: clear to auscultation bilaterally Heart: regular  rate and rhythm Abdomen: obese Extremities: edema 1+ Pulses: 2+ and symmetric Skin: Skin color, texture, turgor normal. No rashes or lesions Neurologic: Grossly normal Psych: Pleasant  EKG: Deferred  ASSESSMENT: Recent TIA/stroke-status post ILR Orthostatic hypotension  Palpitations/tachycardia - improved History of Takatsubo cardiomyopathy, LVEF 60 to 65% (07/2022) Possible heart cath-no known coronary disease Hypertension Dyslipidemia Type 2 diabetes on insulin Obesity  PLAN: 1.   Mrs. Mckeough has had a recent TIA and/or stroke with an implanted loop recorder but no evidence of a cardiac cause of this.  She is also been struggling from some orthostatic hypotension.  She has diabetes on insulin and neuropathy as well as chronic kidney disease which are risk factors for this.  I would advise stopping her amlodipine and we may need to consider stopping her carvedilol as well depending on what happens with her blood pressure.  They are advised to take some home blood pressure readings and reach out to [place] next week.  Will arrange for earlier follow-up in about a month to see if we can continue to scale back her medications.  Ideally would like to allow some even mild hypertension as it is unlikely will prevent episodes of orthostasis however she has a buffer from dropping her blood pressure either but she may not have any syncopal episodes.  Chrystie Nose, MD, [facility], FACP  [place]  [facility]  Medical Director of the Advanced Lipid Disorders &  Cardiovascular Risk Reduction Clinic Diplomate of the American Board of Clinical Lipidology Attending Cardiologist  Direct Dial: [phone]  Fax: [phone]  Website:  [email or web address]  Lisette Abu Khristian Phillippi 06/08/2023, 10:35 AM

## 2023-06-15 ENCOUNTER — Ambulatory Visit: Payer: PPO | Admitting: Podiatry

## 2023-06-15 DIAGNOSIS — F331 Major depressive disorder, recurrent, moderate: Secondary | ICD-10-CM | POA: Diagnosis not present

## 2023-06-16 ENCOUNTER — Other Ambulatory Visit

## 2023-06-16 ENCOUNTER — Other Ambulatory Visit: Payer: PPO

## 2023-06-16 ENCOUNTER — Encounter: Payer: Self-pay | Admitting: Podiatry

## 2023-06-16 ENCOUNTER — Ambulatory Visit: Payer: PPO | Admitting: Podiatry

## 2023-06-16 DIAGNOSIS — M79674 Pain in right toe(s): Secondary | ICD-10-CM | POA: Diagnosis not present

## 2023-06-16 DIAGNOSIS — M2042 Other hammer toe(s) (acquired), left foot: Secondary | ICD-10-CM

## 2023-06-16 DIAGNOSIS — M2041 Other hammer toe(s) (acquired), right foot: Secondary | ICD-10-CM

## 2023-06-16 DIAGNOSIS — L97521 Non-pressure chronic ulcer of other part of left foot limited to breakdown of skin: Secondary | ICD-10-CM

## 2023-06-16 DIAGNOSIS — M21619 Bunion of unspecified foot: Secondary | ICD-10-CM

## 2023-06-16 DIAGNOSIS — L84 Corns and callosities: Secondary | ICD-10-CM

## 2023-06-16 DIAGNOSIS — Z7901 Long term (current) use of anticoagulants: Secondary | ICD-10-CM

## 2023-06-16 DIAGNOSIS — E1142 Type 2 diabetes mellitus with diabetic polyneuropathy: Secondary | ICD-10-CM

## 2023-06-16 DIAGNOSIS — B351 Tinea unguium: Secondary | ICD-10-CM

## 2023-06-16 DIAGNOSIS — M79675 Pain in left toe(s): Secondary | ICD-10-CM | POA: Diagnosis not present

## 2023-06-16 DIAGNOSIS — Z89431 Acquired absence of right foot: Secondary | ICD-10-CM

## 2023-06-16 MED ORDER — MUPIROCIN 2 % EX OINT: 1.0000 | TOPICAL_OINTMENT | Freq: Two times a day (BID) | CUTANEOUS | 2 refills | Status: AC

## 2023-06-19 DIAGNOSIS — I129 Hypertensive chronic kidney disease with stage 1 through stage 4 chronic kidney disease, or unspecified chronic kidney disease: Secondary | ICD-10-CM | POA: Diagnosis not present

## 2023-06-19 DIAGNOSIS — E1129 Type 2 diabetes mellitus with other diabetic kidney complication: Secondary | ICD-10-CM | POA: Diagnosis not present

## 2023-06-19 DIAGNOSIS — E1122 Type 2 diabetes mellitus with diabetic chronic kidney disease: Secondary | ICD-10-CM | POA: Diagnosis not present

## 2023-06-19 DIAGNOSIS — N189 Chronic kidney disease, unspecified: Secondary | ICD-10-CM | POA: Diagnosis not present

## 2023-06-19 DIAGNOSIS — N184 Chronic kidney disease, stage 4 (severe): Secondary | ICD-10-CM | POA: Diagnosis not present

## 2023-06-19 DIAGNOSIS — D631 Anemia in chronic kidney disease: Secondary | ICD-10-CM | POA: Diagnosis not present

## 2023-06-19 NOTE — Progress Notes (Signed)
 Subjective: Chief Complaint  Patient presents with   Bunions    RM#13 Left foot bunion follow up right foot second toe is red and states needs nail trim.     73 year old female presents the office today with a family member, sister for the above concerns.  She does have some redness of the right second toe.  Spread to what it was previously.  Denies any open sores or any drainage.  Denies any fevers or chills.  Toenails are also thickened discolored and the cause discomfort.  She is on Plavix.   Objective: AAO x3, NAD DP/PT pulses palpable bilaterally, CRT less than 3 seconds Protective sensation absent with Semmes Weinstein monofilament. Remaining nails are hypertrophic, dystrophic nail discoloration. Nails affected are 1, 2, 4 on the left and 2 through 5 on the right. No edema, erythema.  Significant left bunion is a small amount hyperkeratotic tissue without any underlying ulceration, drainage or signs of infection. Digital contracture noted along the right second toe at the level of the DIPJ there is moderate edema and erythema to think is more from inflammation as opposed to infection.  No open lesions.  No drainage.  No recent temperature.  No ascending cellulitis. No malodor.  No pain with calf compression, swelling, warmth, erythema  Assessment: 73 year old female healing.  Wound left foot  Plan: Symptomatic onychosis -Sharply debridement nails x 7 any complications or bleeding.  Digital contracture, bunion -I do think that the symptoms on the right second toe more from the digital contracture.  There is no signs of infection.  Since offloading pads.  Also for the bunion continue offloading. -Daily foot inspection  Return in about 9 weeks (around 08/18/2023).  Vivi Barrack DPM

## 2023-06-20 LAB — LAB REPORT - SCANNED
Albumin, Urine POC: 23.9
Creatinine, POC: 69.2 mg/dL
EGFR: 32
Microalb Creat Ratio: 35

## 2023-06-22 ENCOUNTER — Encounter: Payer: Self-pay | Admitting: Internal Medicine

## 2023-06-24 DIAGNOSIS — G4733 Obstructive sleep apnea (adult) (pediatric): Secondary | ICD-10-CM | POA: Diagnosis not present

## 2023-06-27 ENCOUNTER — Ambulatory Visit: Payer: PPO | Admitting: Neurology

## 2023-06-28 LAB — CUP PACEART REMOTE DEVICE CHECK
Date Time Interrogation Session: 20250408115100
Implantable Pulse Generator Implant Date: 20240626
Pulse Gen Serial Number: 105871

## 2023-06-29 ENCOUNTER — Ambulatory Visit (INDEPENDENT_AMBULATORY_CARE_PROVIDER_SITE_OTHER): Payer: PPO

## 2023-06-29 DIAGNOSIS — Z8673 Personal history of transient ischemic attack (TIA), and cerebral infarction without residual deficits: Secondary | ICD-10-CM

## 2023-06-30 NOTE — Addendum Note (Signed)
 Addended by: Elease Etienne A on: 06/30/2023 10:54 AM   Modules accepted: Orders

## 2023-06-30 NOTE — Progress Notes (Signed)
 Carelink Summary Report / Loop Recorder

## 2023-07-01 ENCOUNTER — Encounter: Payer: Self-pay | Admitting: Cardiology

## 2023-07-04 NOTE — Progress Notes (Signed)
 Patient presents to the office today for diabetic shoe and insole measuring.  Patient was measured with brannock device to determine size and width for 1 pair of extra depth shoes and foam casted for 3 pair of insoles.   Documentation of medical necessity will be sent to patient's treating diabetic doctor to verify and sign.   Patient's diabetic provider: Gordy Lauber MD  Shoes and insoles will be ordered at that time and patient will be notified for an appointment for fitting when they arrive.   Shoe size (per patient): 11XWD Shoe choice:   981 Shoe size ordered: 11XWD  Patient has appt 4/25 to take impressions for TF and inserts

## 2023-07-10 DIAGNOSIS — G4733 Obstructive sleep apnea (adult) (pediatric): Secondary | ICD-10-CM | POA: Diagnosis not present

## 2023-07-11 DIAGNOSIS — R8290 Unspecified abnormal findings in urine: Secondary | ICD-10-CM | POA: Diagnosis not present

## 2023-07-11 DIAGNOSIS — N3000 Acute cystitis without hematuria: Secondary | ICD-10-CM | POA: Diagnosis not present

## 2023-07-12 ENCOUNTER — Encounter: Payer: Self-pay | Admitting: Emergency Medicine

## 2023-07-12 ENCOUNTER — Ambulatory Visit: Attending: Emergency Medicine | Admitting: Emergency Medicine

## 2023-07-12 VITALS — BP 110/74 | HR 70 | Ht 69.5 in | Wt 224.0 lb

## 2023-07-12 DIAGNOSIS — R5383 Other fatigue: Secondary | ICD-10-CM

## 2023-07-12 DIAGNOSIS — I5181 Takotsubo syndrome: Secondary | ICD-10-CM

## 2023-07-12 DIAGNOSIS — I10 Essential (primary) hypertension: Secondary | ICD-10-CM

## 2023-07-12 DIAGNOSIS — I951 Orthostatic hypotension: Secondary | ICD-10-CM

## 2023-07-12 DIAGNOSIS — Z8673 Personal history of transient ischemic attack (TIA), and cerebral infarction without residual deficits: Secondary | ICD-10-CM | POA: Diagnosis not present

## 2023-07-12 DIAGNOSIS — I6523 Occlusion and stenosis of bilateral carotid arteries: Secondary | ICD-10-CM | POA: Diagnosis not present

## 2023-07-12 NOTE — Progress Notes (Signed)
 Cardiology Office Note:    Date:  07/12/2023  ID:  [name], DOB 1950/06/19, MRN [id number] PCP: Victorio Grave, MD  [facility] Providers Cardiologist:  Hazle Lites, MD       Patient Profile:      Chief Complaint: 1 month follow-up for orthostatic hypotension History of Present Illness:  [name] is a 73 y.o. female with visit-pertinent history of Takotsubo cardiomyopathy, hypertension, hyperlipidemia, CVA, OSA, T2DM, CKD stage IIIa, left breast cancer, pressure ulcers  Patient established with cardiology service on 07/19/2017 for palpitations.  Patient described palpitations as heart racing and had not had any episodes of 5 months prior to her initial visit.  As she was asymptomatic it was recommended she continue to monitor symptoms and if recurrence would consider monitoring at that time.  Patient presented to the ED via EMS from home on 08/08/2022 for several falls.  She had a mechanical fall hitting her head on the knob of a dresser another fall at night where she was found facedown on the floor in the morning covered in urine and feces.  Head CT showed 16 x 5 mm infarct within the left corona radiata/caudate nucleus new from prior MRI March 2021 but otherwise age-indeterminate.  No acute posttraumatic intracranial findings.  MRI showed multifocal bilateral acute ischemia predominantly affecting the deep gray matter.  Also to the cortical foci within the right MCA territory.  Old bilateral MCA territory infarcts.  Echo showed normal LV/RV function with grade 1 DD.  Carotid duplex showed left ICA 40-59%, right ICA 39%.  She was discharged to inpatient rehab on 08/12/2022.  Patient presented to the ED via EMS from home on 09/10/2022 with complaints of right-sided weakness.  Brain MRI shows acute infarcts of the bilateral corona radiata and in the left parietal lobe new compared to 08/08/2022.  No hemorrhage.  She had a loop recorder implanted on 09/14/2022 by Dr. Marven Slimmer.   She was discharged on 09/14/2022 to inpatient rehab.  She was seen in clinic on 11/01/2022.  She was noted to have some orthostatic hypotension with a drop of 46 mmHg from laying to sitting.  Her amlodipine  was decreased to 5 mg daily and she was continued on carvedilol  and losartan .  Last seen in clinic on 06/08/2023 by Dr. Maximo Spar.  She was recently seen in the ED a few days prior for near syncope and noted to have blood pressures of 80s over 50s.  Her blood pressure in office was 86/38 and patient did appear somewhat sluggish and symptomatic.  Her amlodipine  was discontinued.  She is to follow-up in 1 month.   Discussed the use of AI scribe software for clinical note transcription with the patient, who gave verbal consent to proceed.  History of Present Illness The patient comes in the clinic today with her sister and presents for a follow-up visit after a recent change in antihypertensive medication due to her orthostatic hypotension. The patient's blood pressure has been monitored at home daily and has been within acceptable ranges.  Her blood pressure has been averaging 120s-130s over 70s.  There has been 0 readings over the past month of less than 96 systolic.  The patient is overall asymptomatic.  She denies any episodes of orthostatic hypotension since medication change.  She has been without any syncope, near syncope, lightheadedness, dizziness, falls.  The patient also uses a BiPAP machine for sleep.  She denies chest pain, shortness of breath, lower extremity edema, palpitations, melena, hematuria, hemoptysis,  diaphoresis, weakness, orthopnea, and PND.  Review of systems:  Please see the history of present illness. All other systems are reviewed and otherwise negative.     Home Medications:    Current Meds  Medication Sig   ascorbic acid  (VITAMIN C ) 500 MG tablet Take 1 tablet (500 mg total) by mouth daily.   atorvastatin  (LIPITOR) 80 MG tablet Take 1 tablet (80 mg total) by mouth  daily.   carvedilol  (COREG ) 3.125 MG tablet Take 1 tablet (3.125 mg total) by mouth 2 (two) times daily.   Cholecalciferol  125 MCG (5000 UT) TABS Take 1 tablet (5,000 Units total) by mouth every evening for 30 days then as directed by MD   clopidogrel  (PLAVIX ) 75 MG tablet Take 1 tablet (75 mg total) by mouth daily.   Continuous Glucose Sensor (FREESTYLE LIBRE 2 SENSOR) MISC Apply new sensor every 14 days to monitor blood glucose. Remove old sensor before applying new one.   diclofenac  Sodium (VOLTAREN ) 1 % GEL Apply 4 g topically 4 (four) times daily.   escitalopram  (LEXAPRO ) 20 MG tablet Take 1 tablet (20 mg total) by mouth daily.   insulin  aspart protamine- aspart (NOVOLOG  MIX 70/30) (70-30) 100 UNIT/ML injection Inject 0.25 mLs (25 Units total) into the skin 2 (two) times daily with a meal.   losartan  (COZAAR ) 25 MG tablet Take 1 tablet (25 mg total) by mouth daily.   metFORMIN  (GLUCOPHAGE ) 500 MG tablet Take 1 tablet (500 mg total) by mouth 2 (two) times daily with a meal.   mupirocin  ointment (BACTROBAN ) 2 % Apply 1 Application topically 2 (two) times daily.   PREBIOTIC PRODUCT PO Take 6 g by mouth 2 (two) times daily. Gummies   Zinc  Sulfate 220 (50 Zn) MG TABS Take 1 tablet (220 mg total) by mouth daily for 30 days then as directed by MD   Studies Reviewed:       Echocardiogram 08/09/2022 1. Left ventricular ejection fraction, by estimation, is 60 to 65%. The  left ventricle has normal function. The left ventricle has no regional  wall motion abnormalities. There is mild concentric left ventricular  hypertrophy. Left ventricular diastolic  parameters are consistent with Grade I diastolic dysfunction (impaired  relaxation).   2. Right ventricular systolic function is normal. The right ventricular  size is normal. Tricuspid regurgitation signal is inadequate for assessing  PA pressure.   3. Left atrial size was mild to moderately dilated.   4. The mitral valve is normal in structure.  No evidence of mitral valve  regurgitation. No evidence of mitral stenosis.   5. The aortic valve is tricuspid. There is mild thickening of the aortic  valve. Aortic valve regurgitation is not visualized. Aortic valve  sclerosis is present, with no evidence of aortic valve stenosis.   6. The inferior vena cava is normal in size with greater than 50%  respiratory variability, suggesting right atrial pressure of 3 mmHg.   7. Agitated saline contrast bubble study was negative, with no evidence  of any interatrial shunt.  Risk Assessment/Calculations:             Physical Exam:   VS:  BP 110/74 (BP Location: Right Arm, Patient Position: Sitting, Cuff Size: Normal)   Pulse 70   Ht 5' 9.5" (1.765 m)   Wt 224 lb (101.6 kg)   SpO2 98%   BMI 32.60 kg/m    Wt Readings from Last 3 Encounters:  07/12/23 224 lb (101.6 kg)  06/08/23 224 lb (101.6 kg)  05/31/23 218 lb 14.7 oz (99.3 kg)    GEN: Well nourished, well developed in no acute distress NECK: No JVD; No carotid bruits CARDIAC: RRR, no murmurs, rubs, gallops RESPIRATORY:  Clear to auscultation without rales, wheezing or rhonchi  ABDOMEN: Soft, non-tender, non-distended EXTREMITIES:  No edema; No acute deformity     Assessment and Plan:  Orthostatic hypotension / Hypertension / Fatigue Blood pressure today 110/74 Average blood pressure at home 120s/130s Her amlodipine  was recently discontinued in the setting of orthostatic hypotension, since her average BPs have increased from 80s systolic to more balanced blood pressures - She denies lightheadedness, dizziness, syncope, presyncope, falls.  Much education given on orthostatic hypotension and prevention - Continue carvedilol  3.125 mg twice daily and losartan  25 mg daily  Takotsubo's cardiomyopathy Remote history occurring in 2009 (no records available). - Patient denies any anginal symptoms. No symptoms associated with heart failure exacerbation - Continue to clinically  monitor  CVA s/p ILR Patient suffered from CVA x 2 in May 2024 and June 2024.  - Managed by neurology - Continue Plavix , atorvastatin   Carotid artery disease Carotid duplex May 2024 showed right ICA 39%, left ICA 40-59% - Patient denies any syncope, presyncope and denies any recent lightheadedness or dizziness - Managed by neurology      Dispo:  Return in about 6 months (around 01/11/2024).  Signed, Ava Boatman, NP

## 2023-07-12 NOTE — Patient Instructions (Addendum)
 Medication Instructions:  NO CHANGES   Lab Work: NONE  Testing/Procedures: NONE  Follow-Up: At [facility], you and your health needs are our priority.  As part of our continuing mission to provide you with exceptional heart care, our providers are all part of one team.  This team includes your primary Cardiologist (physician) and Advanced Practice Providers or APPs (Physician Assistants and Nurse Practitioners) who all work together to provide you with the care you need, when you need it.  Your next appointment:   6 MONTHS  Provider:   MADISON FOUNTAIN, DNP  We recommend signing up for the patient portal called "MyChart".  Sign up information is provided on this After Visit Summary.  MyChart is used to connect with patients for Virtual Visits (Telemedicine).  Patients are able to view lab/test results, encounter notes, upcoming appointments, etc.  Non-urgent messages can be sent to your provider as well.   To learn more about what you can do with MyChart, go to [email or web address].   Other Instructions:    1st Floor: - Lobby - Registration  - Pharmacy  - Lab - Cafe  2nd Floor: - PV Lab - Diagnostic Testing (echo, CT, nuclear med)  3rd Floor: - Vacant  4th Floor: - TCTS (cardiothoracic surgery) - AFib Clinic - Structural Heart Clinic - Vascular Surgery  - Vascular Ultrasound  5th Floor: - HeartCare Cardiology (general and EP) - Clinical Pharmacy for coumadin, hypertension, lipid, weight-loss medications, and med management appointments    Valet parking services will be available as well.

## 2023-07-14 ENCOUNTER — Ambulatory Visit

## 2023-07-14 NOTE — Progress Notes (Signed)
 Patient was here today impressions taken for Toe filler Right and 3left inserts will send on iPad once docs are received by Dr Agustin Aldo

## 2023-07-19 DIAGNOSIS — F331 Major depressive disorder, recurrent, moderate: Secondary | ICD-10-CM | POA: Diagnosis not present

## 2023-07-24 DIAGNOSIS — G4733 Obstructive sleep apnea (adult) (pediatric): Secondary | ICD-10-CM | POA: Diagnosis not present

## 2023-08-03 ENCOUNTER — Ambulatory Visit (INDEPENDENT_AMBULATORY_CARE_PROVIDER_SITE_OTHER): Payer: PPO

## 2023-08-03 DIAGNOSIS — Z8673 Personal history of transient ischemic attack (TIA), and cerebral infarction without residual deficits: Secondary | ICD-10-CM

## 2023-08-03 LAB — CUP PACEART REMOTE DEVICE CHECK
Date Time Interrogation Session: 20250515125600
Implantable Pulse Generator Implant Date: 20240626
Pulse Gen Serial Number: 105871

## 2023-08-06 ENCOUNTER — Ambulatory Visit: Payer: Self-pay | Admitting: Cardiology

## 2023-08-11 NOTE — Progress Notes (Signed)
 Carelink Summary Report / Loop Recorder

## 2023-08-21 ENCOUNTER — Encounter: Payer: PPO | Attending: Psychology | Admitting: Psychology

## 2023-08-21 ENCOUNTER — Encounter: Payer: Self-pay | Admitting: Psychology

## 2023-08-21 DIAGNOSIS — R413 Other amnesia: Secondary | ICD-10-CM | POA: Insufficient documentation

## 2023-08-21 DIAGNOSIS — Z794 Long term (current) use of insulin: Secondary | ICD-10-CM | POA: Diagnosis not present

## 2023-08-21 DIAGNOSIS — G4733 Obstructive sleep apnea (adult) (pediatric): Secondary | ICD-10-CM | POA: Diagnosis not present

## 2023-08-21 DIAGNOSIS — E1165 Type 2 diabetes mellitus with hyperglycemia: Secondary | ICD-10-CM | POA: Insufficient documentation

## 2023-08-21 DIAGNOSIS — F331 Major depressive disorder, recurrent, moderate: Secondary | ICD-10-CM | POA: Diagnosis not present

## 2023-08-21 DIAGNOSIS — Z8673 Personal history of transient ischemic attack (TIA), and cerebral infarction without residual deficits: Secondary | ICD-10-CM | POA: Insufficient documentation

## 2023-08-21 NOTE — Progress Notes (Signed)
 Neuropsychological Consultation   Patient:   [name]   DOB:   11-14-1950  MR Number:  [id number]  Location:  [facility] [address] [place], [address] [place] [address] Dept: [phone]           Date of Service:   08/21/2023  Location of Service and Individuals present: Today's visit was conducted in my outpatient clinic office with the patient, her sister Almira Armour and myself present.  Start Time:   3 PM End Time:   5 PM  Today's visit consisted of a 1 hour and 15-minute face-to-face clinical interview and the other 45 minutes was spent with record review, report writing and setting up testing protocols.  Patient Consent and Confidentiality: Limits of confidentiality and consent were obtained with the patient and her sister consenting to proceed with neuropsych evaluation as well as the therapeutic interventions plan.  They both understood that records will be maintained in the electronic medical records with other health professionals capable of access.  Consent for Evaluation and Treatment:  Signed:  Yes Explanation of Privacy Policies:  Signed:  Yes Discussion of Confidentiality Limits:  Yes  Provider/Observer:  Chapman Commodore, Psy.D.       Clinical Neuropsychologist       Billing Code/Service: [id number]  Chief Complaint:     Chief Complaint  Patient presents with   Depression   Memory Loss   Fatigue   Sleeping Problem   Cerebrovascular Accident    Reason for Service:    [name] is a 73 year old female referred for neuropsychological consultation by her treating physiatrist, Dr. Cherri Corns, due to ongoing memory difficulties, depression, fatigue, and multiple falls.  Medical History: Medication Adherence: The patient has not been consistent with taking her medications regularly in the past. Sleep and Eating Habits: She has been sleeping all day and  eating only one meal a day prior to moving in with her sister. Significant sleep disturbances and insomnia are noted at night. Rehabilitation Admissions: The patient has had two admissions to a comprehensive inpatient rehabilitation unit over the past year.  I saw her 1 time during her first admission. Depression: She has had depression for many years. 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 Social Isolation: She has been self-isolated for some time and has not been taking care of herself until recently moving in with her sister. Cancer History: The patient's medical history includes a lumpectomy due to ductal carcinoma in the left breast with a radioactive seed in 2021. Sleep Apnea: She has been diagnosed with obstructive sleep apnea in the past but has not been using CPAP. Chronic Conditions: Chronic kidney disease and hyperlipidemia are also noted, along with past strokes. Psychotropic Medications: Past psychotropic medications include Lexapro  and Wellbutrin . She has taken trazodone in the past to help with sleep. Neurology Follow-Up: The patient was followed by Dr. Ty Gales in neurology with concerns around memory dating back to at least 2021. Previous MRIs indicated multiple areas of cerebrovascular-related brain changes. Neuropsychological Evaluation: A neuropsychological evaluation performed in 2021 did not show consistencies with a progressive neurological disorder. Memory difficulties and cognitive changes were felt to be related to previous cerebrovascular accidents. Cerebrovascular Events: The patient has had both chronic cortical-based infarcts within the bilateral frontal and left parietal lobes and multifocal bilateral acute ischemia primarily affecting deep gray matter. Two cortical foci within the right MCA were noted in  a May 2021 event. Current Status: BiPAP Usage: The patient reports that after her rehab stints, she began using her BiPAP machine more  regularly and sleeps between 1 and 5 hours a night with her mask. She gets up 2-4 times a night to urinate and still wakes up tired. She likes to take naps during the day. Living Situation: The patient now lives with her sister, is on a regular schedule, and is consistently taking her medications. She is eating more appropriately and has been attending her doctor's appointments. Exercise and Therapy: However, she is regularly argumentative with her sister about doing her daily exercises and physical therapy. Independence: The patient has not been independent and has not been driving since her strokes. She has not been able to take care of herself in her own home but still has her home and car, although she is not driving or maintaining her household. She harbors the hope of being able to drive and gain more independence but has not been actively doing her therapies without regular reminders, and even then, her participation is limited and much less than recommended.  Medical History:   Past Medical History:  Diagnosis Date   AKI (acute kidney injury) (HCC) 08/09/2022   Breast cancer (HCC) 12/2019   left breast DCIS   CKD (chronic kidney disease) 06/07/2016   Stage 2, GFR 60-89 ml/min   Diabetic polyneuropathy 06/07/2016   Dyslipidemia    Endometrial adenocarcinoma 2012   Fever blister    Hyperlipidemia    Hypertension, essential 06/07/2016   Major depressive disorder    Mixed dyslipidemia 06/07/2016   MRSA infection 10/2019   left foot wound   Obstructive sleep apnea 06/07/2016   does not use CPAP   Peripheral neuropathy    Retinopathy    Right rib fracture 08/09/2022   Severe obesity    Skin ulcer of left foot with fat layer exposed 11/02/2016   Stroke    Asymptomatic, discovered via neuroimaging; small infarct in left parietal lobe; also concern for small b/l frontal infarcts   Tachycardia 07/19/2017   Traumatic rhabdomyolysis (HCC) 08/09/2022   Type 2 diabetes mellitus with  hyperglycemia, with long-term current use of insulin  06/07/2016   Unilateral primary osteoarthritis, right knee 04/16/2019   UTI (urinary tract infection) 08/09/2022         Patient Active Problem List   Diagnosis Date Noted   Fatigue as sequela of cerebrovascular accident (CVA) 11/02/2022   Urinary retention 11/02/2022   Mobility impaired 11/02/2022   Infarction of left basal ganglia (HCC) 09/14/2022   Acute CVA (cerebrovascular accident) (HCC) 09/10/2022   Major depressive disorder, recurrent episode, moderate (HCC) 08/24/2022   Memory loss due to medical condition 08/24/2022   History of CVA (cerebrovascular accident) 08/12/2022   Pressure injury of skin 08/09/2022   CKD stage 3a, GFR 45-59 ml/min (HCC) 08/09/2022   Lower extremity edema 08/09/2022   Generalized weakness 08/08/2022   Neoplasm of uncertain behavior of skin 12/08/2020   Seborrheic keratosis 12/08/2020   Ductal carcinoma in situ (DCIS) of left breast 01/02/2020   MRSA (methicillin resistant staph aureus) culture positive 10/29/2019   Unilateral primary osteoarthritis, right knee 04/16/2019   Morbid (severe) obesity due to excess calories (HCC) 04/16/2019   Mixed hyperlipidemia 07/19/2017   Palpitations 07/19/2017   Skin ulcer of left foot with fat layer exposed 11/02/2016   Type 2 diabetes mellitus with hyperglycemia, with long-term current use of insulin  06/07/2016   Diabetic peripheral neuropathy (HCC) 06/07/2016  Hypertension, essential 06/07/2016   Obstructive sleep apnea 06/07/2016   Behavioral Observation/Mental Status:   [name]  presents as a 73 y.o.-year-old Right handed Caucasian Female who appeared her stated age. her dress was Appropriate and she was Well Groomed and her manners were Appropriate to the situation.  her participation was indicative of Appropriate behaviors.  There were physical disabilities noted.  she displayed an appropriate level of cooperation and motivation.     Interactions:    Minimal Inattentive and Redirectable  Attention:   abnormal and attention span appeared shorter than expected for age  Memory:   abnormal; remote memory intact, recent memory impaired  Visuo-spatial:   not examined  Speech (Volume):  low  Speech:   normal; normal  Thought Process:  Coherent  Concrete and Coherent  Though Content:  Rumination; particularly about wanting to start driving again and returning home to maintain her household.  However, patient is aware that she needs support post discharge from CIR.  Orientation:   person, place, and situation  Judgment:   Fair  Planning:   Poor  Affect:    Blunted, Depressed, Flat, and Lethargic  Mood:    Dysphoric  Insight:   Fair  Intelligence:   normal  Marital Status/Living:  [place] was [place] 3 siblings.  She currently lives with her sister after being discharged from the inpatient rehabilitation unit.  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  Psychiatric History:  Patient with significant history of depression and continues with psychotropic medications including Wellbutrin , Lexapro  and Remeron at night to help with sleep.  Family Med/Psych History:  Family History  Problem Relation Age of Onset   Hypertension Mother    Alzheimer's disease Mother    Heart attack Father    CAD Father    Alzheimer's disease Father    Bladder Cancer Father        dx late 56s   Diverticulitis Sister    Obesity Sister    Hypertension Sister    Voice disorder Brother    Breast cancer Paternal Aunt 75   Prostate cancer Paternal Uncle        dx mid 12s   Impression/DX:   MERRISA SKORUPSKI  is a 73 year old female referred for neuropsychological consultation by her treating physiatrist, Dr. Cherri Corns, due to ongoing memory difficulties, depression, fatigue, and multiple falls. Medical History: Medication: Inconsistent adherence. Sleep/Eating: Slept all day, ate one meal before moving in with sister; insomnia at night. Rehabilitation: Two admissions in the past year. Depression: Long-standing. 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 Isolation: Neglected self-care until moving in with sister. Cancer: Lumpectomy for ductal carcinoma (2021). Sleep Apnea: Diagnosed but not using CPAP. Chronic Conditions: Kidney disease, hyperlipidemia, past strokes. Medications: Lexapro , Wellbutrin , trazodone for sleep. Neurology: Memory concerns since 2021; MRIs showed cerebrovascular changes. Neuropsychology: 2021 evaluation linked memory issues to cerebrovascular accidents. Cerebrovascular Events: Chronic infarcts in frontal and parietal lobes; acute ischemia in deep gray matter; cortical foci in right MCA (May 2021). Current Status: BiPAP: Uses regularly, sleeps 1-5 hours, wakes up tired, naps during the day. Living Situation: With sister, follows schedule, takes medications, eats better, attends appointments. Exercise/Therapy: Argumentative about  daily exercises. Independence: Not driving or independent since strokes; hopes to drive but needs reminders for therapy.  Disposition/Plan:  We have set the patient up for formal neuropsychological evaluation and she will complete the Wechsler Adult Intelligence Scale and the Wechsler Memory Scale's as a foundational battery.  Once these are completed a formal report will be produced and provided to her referring physician.  As the patient is also dealing with significant depression even before we do the formal assessment I strongly think that the patient would benefit from some psychotherapeutic interventions and I have scheduled the patient for a  couple of visits with me to try to get them done before the neuropsych testing.  I will communicate with Dr. Dorn Gaskins regarding this plan.  Diagnosis:    Memory deficit  History of CVA (cerebrovascular accident)  Major depressive disorder, recurrent episode, moderate (HCC)  Obstructive sleep apnea  Type 2 diabetes mellitus with hyperglycemia, with long-term current use of insulin         Note: This document was prepared using Dragon voice recognition software and may include unintentional dictation errors.   Electronically Signed   _______________________ Chapman Commodore, Psy.D. Clinical Neuropsychologist

## 2023-08-22 ENCOUNTER — Ambulatory Visit: Admitting: Podiatry

## 2023-08-22 ENCOUNTER — Encounter: Payer: Self-pay | Admitting: Podiatry

## 2023-08-22 DIAGNOSIS — M79674 Pain in right toe(s): Secondary | ICD-10-CM | POA: Diagnosis not present

## 2023-08-22 DIAGNOSIS — Z7901 Long term (current) use of anticoagulants: Secondary | ICD-10-CM

## 2023-08-22 DIAGNOSIS — B351 Tinea unguium: Secondary | ICD-10-CM | POA: Diagnosis not present

## 2023-08-22 DIAGNOSIS — L84 Corns and callosities: Secondary | ICD-10-CM

## 2023-08-22 DIAGNOSIS — M79675 Pain in left toe(s): Secondary | ICD-10-CM

## 2023-08-22 NOTE — Progress Notes (Unsigned)
 Subjective: Chief Complaint  Patient presents with   [facility] [place]    RM#DFC    73 year old female presents the office today with a family member, sister for the above concerns.  States that the wounds have healed and there is not been any new lesions or any areas of drainage.  States the nails and calluses need to be trimmed today but otherwise she has been doing well.  She has no other new concerns today.   She is on Plavix .   Objective: AAO x3, NAD DP/PT pulses palpable bilaterally, CRT less than 3 seconds Protective sensation absent with Semmes Weinstein monofilament. Remaining nails are hypertrophic, dystrophic nail discoloration. Nails affected are 1, 2, 4 on the left and 2 through 5 on the right. No edema, erythema.  Significant left bunion is a small amount hyperkeratotic tissue without any underlying ulceration, drainage or signs of infection. Digital contractures present which resulted in hyperkeratotic lesion of the distal portion of bilateral second toes.  There are some dried blood present underneath the left second toe distally but there is no underlying ulceration, drainage or any signs of infection noted today.  No other open lesions are identified.  No pain with calf compression, swelling, warmth, erythema  Assessment: 73 year old female with symptomatic onychosis, preulcerative calluses  Plan: Symptomatic onychosis -Sharply debridement nails x 7 any complications or bleeding.  Preulcerative calluses -Sharply debrided x 3 without any complications or bleeding. -Moisturizer, offloading  -Daily foot inspection, glucose control.  Return in about 2 months (around 10/22/2023).  Charity Conch DPM

## 2023-08-24 DIAGNOSIS — F411 Generalized anxiety disorder: Secondary | ICD-10-CM | POA: Diagnosis not present

## 2023-08-24 DIAGNOSIS — G4733 Obstructive sleep apnea (adult) (pediatric): Secondary | ICD-10-CM | POA: Diagnosis not present

## 2023-08-24 DIAGNOSIS — F331 Major depressive disorder, recurrent, moderate: Secondary | ICD-10-CM | POA: Diagnosis not present

## 2023-08-25 DIAGNOSIS — N390 Urinary tract infection, site not specified: Secondary | ICD-10-CM | POA: Diagnosis not present

## 2023-09-04 ENCOUNTER — Ambulatory Visit

## 2023-09-04 DIAGNOSIS — I639 Cerebral infarction, unspecified: Secondary | ICD-10-CM

## 2023-09-04 LAB — CUP PACEART REMOTE DEVICE CHECK
Date Time Interrogation Session: 20250616060000
Implantable Pulse Generator Implant Date: 20240626
Pulse Gen Serial Number: 105871

## 2023-09-05 ENCOUNTER — Ambulatory Visit: Payer: Self-pay | Admitting: Cardiology

## 2023-09-06 DIAGNOSIS — Z89411 Acquired absence of right great toe: Secondary | ICD-10-CM | POA: Diagnosis not present

## 2023-09-06 DIAGNOSIS — Z794 Long term (current) use of insulin: Secondary | ICD-10-CM | POA: Diagnosis not present

## 2023-09-06 DIAGNOSIS — E1142 Type 2 diabetes mellitus with diabetic polyneuropathy: Secondary | ICD-10-CM | POA: Diagnosis not present

## 2023-09-11 NOTE — Progress Notes (Signed)
 Carelink Summary Report / Loop Recorder

## 2023-09-14 ENCOUNTER — Telehealth: Payer: Self-pay

## 2023-09-14 DIAGNOSIS — N184 Chronic kidney disease, stage 4 (severe): Secondary | ICD-10-CM | POA: Diagnosis not present

## 2023-09-14 NOTE — Telephone Encounter (Signed)
 S/w pt and sister, [name] regarding 8/4 appt. This is a yearly f/u. R/s pt d/t scheduling conflict. She will see Dr Odean 8/12 at 230.

## 2023-09-15 DIAGNOSIS — N184 Chronic kidney disease, stage 4 (severe): Secondary | ICD-10-CM | POA: Diagnosis not present

## 2023-09-16 DIAGNOSIS — R399 Unspecified symptoms and signs involving the genitourinary system: Secondary | ICD-10-CM | POA: Diagnosis not present

## 2023-09-16 DIAGNOSIS — N390 Urinary tract infection, site not specified: Secondary | ICD-10-CM | POA: Diagnosis not present

## 2023-09-18 DIAGNOSIS — D631 Anemia in chronic kidney disease: Secondary | ICD-10-CM | POA: Diagnosis not present

## 2023-09-18 DIAGNOSIS — E1129 Type 2 diabetes mellitus with other diabetic kidney complication: Secondary | ICD-10-CM | POA: Diagnosis not present

## 2023-09-18 DIAGNOSIS — I129 Hypertensive chronic kidney disease with stage 1 through stage 4 chronic kidney disease, or unspecified chronic kidney disease: Secondary | ICD-10-CM | POA: Diagnosis not present

## 2023-09-18 DIAGNOSIS — N184 Chronic kidney disease, stage 4 (severe): Secondary | ICD-10-CM | POA: Diagnosis not present

## 2023-09-19 DIAGNOSIS — F331 Major depressive disorder, recurrent, moderate: Secondary | ICD-10-CM | POA: Diagnosis not present

## 2023-09-23 DIAGNOSIS — G4733 Obstructive sleep apnea (adult) (pediatric): Secondary | ICD-10-CM | POA: Diagnosis not present

## 2023-09-25 ENCOUNTER — Ambulatory Visit: Payer: PPO | Admitting: Neurology

## 2023-09-25 ENCOUNTER — Encounter: Payer: Self-pay | Admitting: Neurology

## 2023-09-25 VITALS — BP 118/70 | HR 66 | Ht 69.5 in | Wt 231.8 lb

## 2023-09-25 DIAGNOSIS — I639 Cerebral infarction, unspecified: Secondary | ICD-10-CM

## 2023-09-25 NOTE — Patient Instructions (Addendum)
 Good to see you. Continue Plavix , control of blood pressure, cholesterol, glucose levels. Continue working on increasing regular exercise and brain stimulation exercises. Proceed with visits with Dr. Corina and [facility]. Follow-up as needed, call for any changes.

## 2023-09-25 NOTE — Progress Notes (Signed)
 NEUROLOGY FOLLOW UP OFFICE NOTE  [name] [id number] Aug 03, 1950  HISTORY OF PRESENT ILLNESS: I had the pleasure of seeing [name] in follow-up in the neurology clinic on 09/25/2023.  The patient was last seen almost a year ago for recurrent strokes. She is again accompanied by her sister [name] who helps supplement the history today.  Records and images were personally reviewed where available. She had recurrent strokes on 08/09/22 and 09/10/22 in multiple vascular distributions, concerning for cardioembolic source. She has residual left leg weakness and right hand weakness/change in handwriting. She has a loop recorder with no arrhythmias noted. She is on Plavix  for secondary stroke prevention. Since her last visit, no new stroke symptoms. She always uses her walker, she states her left leg has not been as strong due to surgery. She is not too steady on her feet and had a fall in 12/2022 and on 12/7 as she was getting out of bed. She was able to get herself up. She was in the ER for near syncope in 05/2023, with note of low BP. BP today 118/70. She reports doing home PT exercises twice a day and walking laps around the house, [name] says she does it sometimes. She sees Urology for urinary incontinence. She no longer uses the potty chair and goes to the bathroom but [name] reports she is now urinating in bed, she states I sleep and I have to go but do not want to get up. She changes her pads several times in the day because she waits too long to go. They report CKD has progressed from 3b to 4a. There is plan to start Ozempic after her retina specialist clears her. She feels her mood is okay, [name] states her dry humor is there. She is on Lexapro . She states It's my sense of humor that gets me in trouble sometimes, she does not take things seriously. She sees [facility]. She wears her BiPaP at night but takes it off after 4 hours, sometimes she keeps it on longer. She gets sleepy in the  afternoon. [name] continues to notice difficulty with follow through on processing information. [name] administers medications, meals. She does not drive.    History on Initial Assessment 11/16/2022: This is a pleasant 73 year old right-handed woman with a history of hypertension, hyperlipidemia, Takotsubo cardiomyopathy, diabetes, sleep apnea, neuropathy, breast cancer, endometrial adenocarcinoma, prior stroke, presenting for evaluation after hospitalization for recurrent stroke on 08/09/22 and 09/10/22. She was previously seen in our office in 2021 for memory loss. Her sister [name] is present to provide additional information. Records were reviewed. She presented to the ER on 08/09/22 with progressive weakness and falls for 3 days, significant bilateral lower extremity edema, on the ground for 10 hours. BP 198/72, NIHSS 0. I personally reviewed brain MRI showing multifocal bilateral acute infarcts, predominantly affecting the deep gray matter, as well as 2 cortical foci within the right MCA territory. There were old bilateral MCA territory infarcts. MRA normal, carotid doppler showed left 40-59% ICA stenosis. Echocardiogram showed EF 60-65%, mild concentric LVH, grade 1 diastolic dysfunction, moderately dilated left atria, negative bubble study. LDL 58, HbA1c 7.0. She was discharged home on DAPT x 3 weeks then aspirin  with plan for 30-day holter. She was discharged to inpatient rehab then home to live with [name] on 08/25/22 with residual slightly slurred speech, word-finding difficulties and chronic left leg weakness. On 09/09/22, she started having right arm and leg weakness with low BP (95s/40s), weakness worsened overnight,  EMS was called early morning 6/22 with NIHSS 7 in the ER. Brain MRI showed new acute infarcts in the bilateral corona radiata and left parietal lobe. She was again in inpatient rehab until 7/12 discharged on DAPT x 3 weeks then Plavix  alone. She had a loop monitor placed prior to hospital  discharge.  She has been staying with [name] since then, they report that after the second stroke, left leg weakness was worse than baseline after 2007 thoracotomy. She has a hard time grasping things with her right hand, her handwriting has changed. No focal paresthesias, she reports numbness below the knees from neuropathy, no pain. She denies any headaches. She felt dizzy getting out of bed today. She loves to sing but notes her voice is gone from the stroke, no difficulty swallowing. [name] has been managing her medications but she hopes to go back home where she was living alone prior to the strokes. She sleeps well at night but feels tired, very weak, just wanting to sleep in the daytime. [name] has been trying to encourage her to exercise regularly and stay with their routine and this leads to conflict. Prior to the strokes, she had a lot of depression, sleeping all day. She sees [facility] for medication management. She had been doing home PT and it was felt she could benefit from more but that she should also be doing the home exercises which has been the challenge. They went to the ER last night due to foul smelling urine, she has not started antibiotic prescribed. Last fall was a couple of weeks ago.    PAST MEDICAL HISTORY: Past Medical History:  Diagnosis Date   AKI (acute kidney injury) (HCC) 08/09/2022   Breast cancer (HCC) 12/2019   left breast DCIS   CKD (chronic kidney disease) 06/07/2016   Stage 2, GFR 60-89 ml/min   Diabetic polyneuropathy 06/07/2016   Dyslipidemia    Endometrial adenocarcinoma 2012   Fever blister    Hyperlipidemia    Hypertension, essential 06/07/2016   Major depressive disorder    Mixed dyslipidemia 06/07/2016   MRSA infection 10/2019   left foot wound   Obstructive sleep apnea 06/07/2016   does not use CPAP   Peripheral neuropathy    Retinopathy    Right rib fracture 08/09/2022   Severe obesity    Skin ulcer of left foot with fat layer  exposed 11/02/2016   Stroke    Asymptomatic, discovered via neuroimaging; small infarct in left parietal lobe; also concern for small b/l frontal infarcts   Tachycardia 07/19/2017   Traumatic rhabdomyolysis (HCC) 08/09/2022   Type 2 diabetes mellitus with hyperglycemia, with long-term current use of insulin  06/07/2016   Unilateral primary osteoarthritis, right knee 04/16/2019   UTI (urinary tract infection) 08/09/2022    MEDICATIONS: Current Outpatient Medications on File Prior to Visit  Medication Sig Dispense Refill   acetaminophen  (TYLENOL ) 325 MG tablet Take 2 tablets (650 mg total) by mouth every 4 (four) hours as needed for mild pain (or temp > 37.5 C (99.5 F)).     ascorbic acid  (VITAMIN C ) 500 MG tablet Take 1 tablet (500 mg total) by mouth daily. 100 tablet 0   atorvastatin  (LIPITOR) 80 MG tablet Take 1 tablet (80 mg total) by mouth daily. 30 tablet 0   buPROPion  (WELLBUTRIN  XL) 150 MG 24 hr tablet Take 1 tablet (150 mg total) by mouth daily. 30 tablet 3   carvedilol  (COREG ) 3.125 MG tablet Take 1 tablet (3.125 mg total) by  mouth 2 (two) times daily. 60 tablet 0   CEPHALEXIN  PO Take by mouth.     Cholecalciferol  125 MCG (5000 UT) TABS Take 1 tablet (5,000 Units total) by mouth every evening for 30 days then as directed by MD 100 tablet 0   clopidogrel  (PLAVIX ) 75 MG tablet Take 1 tablet (75 mg total) by mouth daily. 30 tablet 0   Continuous Glucose Sensor (FREESTYLE LIBRE 2 SENSOR) MISC Apply new sensor every 14 days to monitor blood glucose. Remove old sensor before applying new one.     diclofenac  Sodium (VOLTAREN ) 1 % GEL Apply 4 g topically 4 (four) times daily. 400 g 0   escitalopram  (LEXAPRO ) 20 MG tablet Take 1 tablet (20 mg total) by mouth daily. 30 tablet 0   insulin  aspart protamine- aspart (NOVOLOG  MIX 70/30) (70-30) 100 UNIT/ML injection Inject 0.25 mLs (25 Units total) into the skin 2 (two) times daily with a meal. 10 mL 11   losartan  (COZAAR ) 25 MG tablet Take 1 tablet  (25 mg total) by mouth daily. 30 tablet 0   metFORMIN  (GLUCOPHAGE ) 500 MG tablet Take 1 tablet (500 mg total) by mouth 2 (two) times daily with a meal. 60 tablet 0   mirtazapine (REMERON) 7.5 MG tablet Take 7.5 mg by mouth at bedtime.     mupirocin  ointment (BACTROBAN ) 2 % Apply 1 Application topically 2 (two) times daily. 30 g 2   PREBIOTIC PRODUCT PO Take 6 g by mouth 2 (two) times daily. Gummies     Zinc  Sulfate 220 (50 Zn) MG TABS Take 1 tablet (220 mg total) by mouth daily for 30 days then as directed by MD 100 tablet 0   No current facility-administered medications on file prior to visit.    ALLERGIES: Allergies  Allergen Reactions   Elavil  [Amitriptyline ] Other (See Comments)    Unknown reaction   Pristiq [Desvenlafaxine] Other (See Comments)    Unknown allergy   Cipro [Ciprofloxacin Hcl] Rash   Penicillins Rash   Sulfa Antibiotics Rash   Sulfamethoxazole Rash and Dermatitis   Sulfur Rash    FAMILY HISTORY: Family History  Problem Relation Age of Onset   Hypertension Mother    Alzheimer's disease Mother    Heart attack Father    CAD Father    Alzheimer's disease Father    Bladder Cancer Father        dx late 40s   Diverticulitis Sister    Obesity Sister    Hypertension Sister    Voice disorder Brother    Breast cancer Paternal Aunt 68   Prostate cancer Paternal Uncle        dx mid 40s    SOCIAL HISTORY: Social History   Socioeconomic History   Marital status: Single    Spouse name: Not on file   Number of children: Not on file   Years of education: 16   Highest education level: Bachelor's degree (e.g., BA, AB, BS)  Occupational History   Occupation: Retired    Comment: Runner, broadcasting/film/video  Tobacco Use   Smoking status: Never   Smokeless tobacco: Never  Vaping Use   Vaping status: Never Used  Substance and Sexual Activity   Alcohol use: No   Drug use: No   Sexual activity: Not Currently    Birth control/protection: Surgical  Other Topics Concern   Not on  file  Social History Narrative   Right Handed   Lives in one story home   Does not drink caffeine  Social Drivers of Corporate investment banker Strain: Not on file  Food Insecurity: Not on file  Transportation Needs: Not on file  Physical Activity: Not on file  Stress: Not on file  Social Connections: Unknown (10/18/2021)   Received from [facility]   Social Network    Social Network: Not on file  Intimate Partner Violence: Unknown (10/18/2021)   Received from [facility]   HITS    Physically Hurt: Not on file    Insult or Talk Down To: Not on file    Threaten Physical Harm: Not on file    Scream or Curse: Not on file     PHYSICAL EXAM: Vitals:   09/25/23 1513  BP: 118/70  Pulse: 66  SpO2: 99%   General: No acute distress, flat affect but making jokes in the office today Head:  Normocephalic/atraumatic Skin/Extremities: No rash, no edema Neurological Exam: alert and awake. No aphasia or dysarthria. Fund of knowledge is appropriate.  Attention and concentration are normal.   Cranial nerves: Pupils equal, round. Extraocular movements intact with no nystagmus. Visual fields full.  No facial asymmetry.  Motor: Bulk and tone normal, muscle strength 5/5 throughout except for 4/5 left hip flexion. Finger to nose testing intact.  Gait slow and cautious with cane, no ataxia. No tremors.    IMPRESSION: This is a pleasant 73 yo RH woman with a history of hypertension, hyperlipidemia, Takotsubo cardiomyopathy, diabetes, sleep apnea, neuropathy, breast cancer, endometrial adenocarcinoma, with recurrent strokes. She had strokes in May and June 2024 in multiple vascular distributions, concerning for cardioembolic source. She has a loop recorder with no arrhythmias seen. Continue Plavix  and control of vascular risk factors for secondary stroke prevention. She was encouraged to continue with regular exercise and brain stimulation exercises. Proceed with Neuropsychological evaluation with  Dr. Corina, continue follow-up with Behavioral Health. Follow-up as needed, they know to call for any changes.   Thank you for allowing me to participate in her care.  Please do not hesitate to call for any questions or concerns.    Darice Shivers, M.D.   CC: Dr. Teresa

## 2023-09-27 ENCOUNTER — Telehealth: Payer: Self-pay

## 2023-09-27 NOTE — Telephone Encounter (Signed)
 Prior auth request sent to [employer]  A5500 2ea BIL shoes A5514 3ea Left inserts L5000 1ea Toefiller   [name] Cped, CFo, CFm

## 2023-09-28 DIAGNOSIS — H35033 Hypertensive retinopathy, bilateral: Secondary | ICD-10-CM | POA: Diagnosis not present

## 2023-09-28 DIAGNOSIS — H348312 Tributary (branch) retinal vein occlusion, right eye, stable: Secondary | ICD-10-CM | POA: Diagnosis not present

## 2023-09-28 DIAGNOSIS — H43813 Vitreous degeneration, bilateral: Secondary | ICD-10-CM | POA: Diagnosis not present

## 2023-10-04 ENCOUNTER — Ambulatory Visit (INDEPENDENT_AMBULATORY_CARE_PROVIDER_SITE_OTHER)

## 2023-10-04 DIAGNOSIS — M2041 Other hammer toe(s) (acquired), right foot: Secondary | ICD-10-CM | POA: Diagnosis not present

## 2023-10-04 DIAGNOSIS — L84 Corns and callosities: Secondary | ICD-10-CM | POA: Diagnosis not present

## 2023-10-04 DIAGNOSIS — E1142 Type 2 diabetes mellitus with diabetic polyneuropathy: Secondary | ICD-10-CM

## 2023-10-04 DIAGNOSIS — M2042 Other hammer toe(s) (acquired), left foot: Secondary | ICD-10-CM | POA: Diagnosis not present

## 2023-10-04 DIAGNOSIS — Z89431 Acquired absence of right foot: Secondary | ICD-10-CM

## 2023-10-05 ENCOUNTER — Encounter: Payer: Self-pay | Admitting: Neurology

## 2023-10-05 ENCOUNTER — Ambulatory Visit (INDEPENDENT_AMBULATORY_CARE_PROVIDER_SITE_OTHER)

## 2023-10-05 DIAGNOSIS — I639 Cerebral infarction, unspecified: Secondary | ICD-10-CM

## 2023-10-06 ENCOUNTER — Ambulatory Visit: Payer: Self-pay | Admitting: Cardiology

## 2023-10-06 LAB — CUP PACEART REMOTE DEVICE CHECK
Date Time Interrogation Session: 20250717130400
Implantable Pulse Generator Implant Date: 20240626
Pulse Gen Serial Number: 105871

## 2023-10-06 NOTE — Progress Notes (Deleted)
 Patient presents today to pick up diabetic shoes and insoles.  Patient was dispensed 1 pair of diabetic shoes RT toefiller and 3 inserts for Left foot / custom   Fit was satisfactory. Shoes and inserts will help to provide support, protection and help improve balance Instructions for break-in and wear was reviewed and a copy was given to the patient.   Re-appointment for regularly scheduled diabetic foot care visits or if they should experience any trouble with the shoes or insoles.  Lolita Schultze CPed CFo, CFm

## 2023-10-09 DIAGNOSIS — G4733 Obstructive sleep apnea (adult) (pediatric): Secondary | ICD-10-CM | POA: Diagnosis not present

## 2023-10-11 DIAGNOSIS — G4733 Obstructive sleep apnea (adult) (pediatric): Secondary | ICD-10-CM | POA: Diagnosis not present

## 2023-10-12 ENCOUNTER — Telehealth: Payer: Self-pay | Admitting: Podiatry

## 2023-10-12 NOTE — Telephone Encounter (Signed)
 Pts sister (anita on dpr) called.. the shoes are not working for the pt. It causes a tingling sensation. [name] stated the pt has an ulcer bottom L foot and wearing the shoes has caused her feet to bleed.

## 2023-10-12 NOTE — Progress Notes (Signed)
 Bsx Loop Recorder

## 2023-10-12 NOTE — Telephone Encounter (Signed)
 Left detailed message asked to return call for further assistance.

## 2023-10-16 NOTE — Telephone Encounter (Signed)
 Patient has appt with Dr Gershon on 8.15. She will be bringing shoes and inserts with her in casr Dr. Lorelee me to modify if needed  Lolita Schultze Cped, CFo, CFm

## 2023-10-23 ENCOUNTER — Ambulatory Visit: Payer: PPO | Admitting: Hematology and Oncology

## 2023-10-24 ENCOUNTER — Ambulatory Visit: Admitting: Podiatry

## 2023-10-24 ENCOUNTER — Ambulatory Visit (INDEPENDENT_AMBULATORY_CARE_PROVIDER_SITE_OTHER)

## 2023-10-24 DIAGNOSIS — L97521 Non-pressure chronic ulcer of other part of left foot limited to breakdown of skin: Secondary | ICD-10-CM

## 2023-10-24 DIAGNOSIS — M79674 Pain in right toe(s): Secondary | ICD-10-CM

## 2023-10-24 DIAGNOSIS — M79675 Pain in left toe(s): Secondary | ICD-10-CM | POA: Diagnosis not present

## 2023-10-24 DIAGNOSIS — Z7901 Long term (current) use of anticoagulants: Secondary | ICD-10-CM

## 2023-10-24 DIAGNOSIS — B351 Tinea unguium: Secondary | ICD-10-CM | POA: Diagnosis not present

## 2023-10-24 DIAGNOSIS — G4733 Obstructive sleep apnea (adult) (pediatric): Secondary | ICD-10-CM | POA: Diagnosis not present

## 2023-10-24 DIAGNOSIS — L97522 Non-pressure chronic ulcer of other part of left foot with fat layer exposed: Secondary | ICD-10-CM | POA: Diagnosis not present

## 2023-10-24 MED ORDER — DOXYCYCLINE HYCLATE 100 MG PO TABS: 100.0000 mg | ORAL_TABLET | Freq: Two times a day (BID) | ORAL | 0 refills | Status: AC

## 2023-10-24 NOTE — Patient Instructions (Addendum)
 Clean wound with soap and water, dry well.  Apply Hydrofera Blue to the dressing.  Change daily.  Continue surgical shoe for offloading.  Start doxycyline.  Monitor for any signs/symptoms of infection. Call the office immediately if any occur or go directly to the emergency room. Call with any questions/concerns.

## 2023-10-25 NOTE — Progress Notes (Signed)
 Subjective: Chief Complaint  Patient presents with   Diabetic Ulcer    Rm13 F/u ulceration left foot and nail trim/ patient says she is doing well.    73 year old female presents the office today with a family member, sister for the above concerns.  She said her nails tremors are getting elongated but she also states that she has some skin that peeled on the bottom of the left foot and she peeled it off and now she has a wound that is currently being treated at home with blast X.  Denies any significant drainage or purulence.  She does not have any pain although she does have neuropathy.  No fevers or chills.   She is on Plavix .   Objective: AAO x3, NAD DP/PT pulses palpable bilaterally, CRT less than 3 seconds Protective sensation absent with Semmes Weinstein monofilament. Remaining nails are hypertrophic, dystrophic nail discoloration. Nails affected are 1, 2, 4 on the left and 2 through 5 on the right. No edema, erythema.  Significant left bunion with an ulceration underlying this area really is present with hyperkeratotic periwound.  After debrided the wound it measured approximate 1 cm in diameter with a depth of 0.2 cm.  Prior debridement the wound was smaller measuring 0.8 x 0.8 x 0.1 cm.  There is no probing to bone, undermining or tunneling.  There is localized erythema dorsally.  There is no ascending cellulitis but there is no drainage or pus.  No fluctuation or crepitation.  No malodor. No pain with calf compression, swelling, warmth, erythema    Assessment: 73 year old female with symptomatic onychosis, ulceration right foot  Plan: Symptomatic onychosis -Sharply debridement nails x 7 any complications or bleeding.  Ulceration right foot - Medically necessary wound debridement was performed today.  I sharply debrided the wound with a #312 with scalpel to remove nonviable, devitalized tissue in order to promote wound healing.  Debrided down to healthy, granular tissue.  No  significant blood loss.  Cleaned the area with saline.  Antibiotic ointment was applied followed by dressing.  Discussed daily dressing changes.  Recommend Hydrofera Blue which I gave her today.  Remain in surgical shoe with peg assist which she is wearing today as well. - Doxycycline   Radiology: X-rays were obtained reviewed of the left foot.  3 views were obtained.  Soft tissue lucencies noted along the first MPJ along the area of ulceration.  There is no obvious cortical changes to suggest osteomyelitis at this time there is no soft tissue edema.  Return in about 2 weeks (around 11/07/2023) for left foot ulcer.  [name] DPM

## 2023-10-31 ENCOUNTER — Inpatient Hospital Stay: Attending: Hematology and Oncology | Admitting: Hematology and Oncology

## 2023-10-31 VITALS — BP 127/48 | HR 66 | Temp 98.5°F | Resp 18 | Ht 69.5 in | Wt 231.8 lb

## 2023-10-31 DIAGNOSIS — Z923 Personal history of irradiation: Secondary | ICD-10-CM | POA: Insufficient documentation

## 2023-10-31 DIAGNOSIS — D0512 Intraductal carcinoma in situ of left breast: Secondary | ICD-10-CM | POA: Insufficient documentation

## 2023-10-31 NOTE — Assessment & Plan Note (Signed)
 01/02/2020: Screening mammogram showed two groups of calcifications in the inferior medial quadrant of the left breast, 0.5cm and 0.9cm. Biopsy showed DCIS, grade 3, ER/PR negative.  02/25/20: Left lumpectomy Jan): high grade DCIS, 1.4cm, clear margins 04/02/19-04/29/20: XRT  Hospitalization 09/14/2022-09/30/2022: Left basal ganglia infarction   Tamoxifen  toxicities: Recommended discontinuation of tamoxifen .   Stroke: Causing neurological deficits and difficulty with ambulation.  She is able to get around with the help of walker.  She is currently living with her sister.   Breast cancer surveillance: 1.  Breast exam 10/31/2023: Benign 2. mammogram: At [facility] 02/02/2023: Benign breast density category B 3.  Bone density 06/08/2022: T-score -0.5: Normal   Return to clinic in 1 year for follow-up

## 2023-10-31 NOTE — Progress Notes (Signed)
 Patient Care Team: Teresa Channel, MD as PCP - General (Family Medicine) Mona Vinie BROCKS, MD as PCP - Cardiology (Cardiology) Georjean Darice HERO, MD as Consulting Physician (Neurology) Odean Potts, MD as Consulting Physician (Hematology and Oncology) Izell Domino, MD as Attending Physician (Radiation Oncology) Vernetta Berg, MD as Consulting Physician (General Surgery)  DIAGNOSIS:  Encounter Diagnosis  Name Primary?   Ductal carcinoma in situ (DCIS) of left breast Yes    SUMMARY OF ONCOLOGIC HISTORY: Oncology History  Ductal carcinoma in situ (DCIS) of left breast  01/02/2020 Initial Diagnosis   Screening mammogram showed two groups of calcifications in the inferior medial quadrant of the left breast, 0.5cm and 0.9cm. Biopsy showed DCIS, grade 3, ER/PR negative.    01/08/2020 Cancer Staging   Staging form: Breast, AJCC 8th Edition - Clinical stage from 01/08/2020: Stage 0 (cTis (DCIS), cN0, cM0, ER-, PR-, HER2: Not Assessed)   02/25/2020 Surgery   Left lumpectomy Jan) [phone]): high grade DCIS, 1.4cm, clear margins. No regional lymph nodes were examined.   03/31/2020 - 04/29/2020 Radiation Therapy   The patient initially received a dose of 40.05 Gy in 15 fractions to the breast using whole-breast tangent fields. This was delivered using a 3-D conformal technique. The pt received a boost delivering an additional 10 Gy in 5 fractions using a electron boost with electrons. The total dose was 50.05 Gy.   04/2020 - 04/2025 Anti-estrogen oral therapy   Tamoxifen      CHIEF COMPLIANT: Surveillance of history of DCIS  HISTORY OF PRESENT ILLNESS:   History of Present Illness [name] is a 73 year old female who presents for potassium infusion and follow-up on breast cancer management.  Because of history of stroke tamoxifen  has been discontinued.  She has been getting physical therapy and doing her exercises and she has not been able to avoid frequent falls.   She walks with the help of a walker.  Her breast cancer management includes completed radiation therapy, and her recent mammogram in November was normal.     ALLERGIES:  is allergic to elavil  [amitriptyline ], pristiq [desvenlafaxine], cipro [ciprofloxacin hcl], penicillins, sulfa antibiotics, sulfamethoxazole, and sulfur.  MEDICATIONS:  Current Outpatient Medications  Medication Sig Dispense Refill   acetaminophen  (TYLENOL ) 325 MG tablet Take 2 tablets (650 mg total) by mouth every 4 (four) hours as needed for mild pain (or temp > 37.5 C (99.5 F)).     APPLE CIDER VINEGAR PO Take by mouth.     ascorbic acid  (VITAMIN C ) 500 MG tablet Take 1 tablet (500 mg total) by mouth daily. 100 tablet 0   atorvastatin  (LIPITOR) 80 MG tablet Take 1 tablet (80 mg total) by mouth daily. 30 tablet 0   carvedilol  (COREG ) 3.125 MG tablet Take 1 tablet (3.125 mg total) by mouth 2 (two) times daily. 60 tablet 0   Cholecalciferol  125 MCG (5000 UT) TABS Take 1 tablet (5,000 Units total) by mouth every evening for 30 days then as directed by MD 100 tablet 0   CINNAMON PO Take by mouth.     clopidogrel  (PLAVIX ) 75 MG tablet Take 1 tablet (75 mg total) by mouth daily. 30 tablet 0   Continuous Glucose Sensor (FREESTYLE LIBRE 2 SENSOR) MISC Apply new sensor every 14 days to monitor blood glucose. Remove old sensor before applying new one. (Patient taking differently: Libre 3 every 16 days)     diclofenac  Sodium (VOLTAREN ) 1 % GEL Apply 4 g topically 4 (four) times daily. 400 g  0   doxycycline  (VIBRA -TABS) 100 MG tablet Take 1 tablet (100 mg total) by mouth 2 (two) times daily. 20 tablet 0   escitalopram  (LEXAPRO ) 20 MG tablet Take 1 tablet (20 mg total) by mouth daily. 30 tablet 0   insulin  aspart protamine- aspart (NOVOLOG  MIX 70/30) (70-30) 100 UNIT/ML injection Inject 0.25 mLs (25 Units total) into the skin 2 (two) times daily with a meal. (Patient taking differently: Inject 25 Units into the skin 2 (two) times daily  with a meal. Am 12 units and pm is 16 units) 10 mL 11   losartan  (COZAAR ) 25 MG tablet Take 1 tablet (25 mg total) by mouth daily. 30 tablet 0   mupirocin  ointment (BACTROBAN ) 2 % Apply 1 Application topically 2 (two) times daily. 30 g 2   PREBIOTIC PRODUCT PO Take 6 g by mouth 2 (two) times daily. Gummies     Zinc  Sulfate 220 (50 Zn) MG TABS Take 1 tablet (220 mg total) by mouth daily for 30 days then as directed by MD 100 tablet 0   No current facility-administered medications for this visit.    PHYSICAL EXAMINATION: ECOG PERFORMANCE STATUS: 1 - Symptomatic but completely ambulatory  Vitals:   10/31/23 1452  BP: (!) 127/48  Pulse: 66  Resp: 18  Temp: 98.5 F (36.9 C)  SpO2: 100%   Filed Weights   10/31/23 1452  Weight: 231 lb 12.8 oz (105.1 kg)    Physical Exam No palpable lumps or nodules bilateral breasts were examined  (exam performed in the presence of a chaperone)  LABORATORY DATA:  I have reviewed the data as listed    Latest Ref Rng & Units 05/31/2023    5:29 PM 12/26/2022    6:30 PM 12/26/2022    5:43 PM  CMP  Glucose 70 - 99 mg/dL 889  885  872   BUN 8 - 23 mg/dL 31  35  40   Creatinine 0.44 - 1.00 mg/dL 8.16  8.36  8.19   Sodium 135 - 145 mmol/L 138  136  139   Potassium 3.5 - 5.1 mmol/L 4.5  3.8  4.3   Chloride 98 - 111 mmol/L 105  104  103   CO2 22 - 32 mmol/L 21  23    Calcium  8.9 - 10.3 mg/dL 9.3  8.9    Total Protein 6.5 - 8.1 g/dL 6.7     Total Bilirubin 0.0 - 1.2 mg/dL 0.6     Alkaline Phos 38 - 126 U/L 41     AST 15 - 41 U/L 15     ALT 0 - 44 U/L 16       Lab Results  Component Value Date   WBC 7.0 05/31/2023   HGB 10.1 (L) 05/31/2023   HCT 31.7 (L) 05/31/2023   MCV 99.4 05/31/2023   PLT 223 05/31/2023   NEUTROABS 4.4 05/31/2023    ASSESSMENT & PLAN:  Ductal carcinoma in situ (DCIS) of left breast 01/02/2020: Screening mammogram showed two groups of calcifications in the inferior medial quadrant of the left breast, 0.5cm and 0.9cm.  Biopsy showed DCIS, grade 3, ER/PR negative.  02/25/20: Left lumpectomy Jan): high grade DCIS, 1.4cm, clear margins 04/02/19-04/29/20: XRT  Hospitalization 09/14/2022-09/30/2022: Left basal ganglia infarction   Tamoxifen  toxicities: Recommended discontinuation of tamoxifen .   Stroke: Causing neurological deficits and difficulty with ambulation.  She is able to get around with the help of walker.  She is currently living with her sister.   Breast  cancer surveillance: 1.  Breast exam 10/31/2023: Benign 2. mammogram: At [facility] 02/02/2023: Benign breast density category B 3.  Bone density 06/08/2022: T-score -0.5: Normal   Return to clinic on an as-needed basis  Assessment & Plan    Ductal carcinoma in situ of left breast, post-treatment Post-treatment for DCIS with no concerning findings on recent mammogram. Tamoxifen  discontinued due to neurological issues and low recurrence risk. Completed radiation therapy. - Discontinue tamoxifen . - Continue annual mammograms. - Monitor for new findings and follow up as needed.  History of stroke with residual balance impairment Residual balance impairment with improvement noted, no falls since October. Regular exercises and physical therapy aiding recovery. - Continue regular exercises and physical therapy.      No orders of the defined types were placed in this encounter.  The patient has a good understanding of the overall plan. she agrees with it. she will call with any problems that may develop before the next visit here. Total time spent: 30 mins including face to face time and time spent for planning, charting and co-ordination of care   Viinay K Almin Livingstone, MD 10/31/23

## 2023-11-02 ENCOUNTER — Encounter: Payer: Self-pay | Admitting: Diagnostic Radiology

## 2023-11-06 ENCOUNTER — Ambulatory Visit: Attending: Cardiology

## 2023-11-06 DIAGNOSIS — I639 Cerebral infarction, unspecified: Secondary | ICD-10-CM | POA: Diagnosis not present

## 2023-11-08 LAB — CUP PACEART REMOTE DEVICE CHECK
Date Time Interrogation Session: 20250818010800
Implantable Pulse Generator Implant Date: 20240626
Pulse Gen Serial Number: 105871

## 2023-11-09 ENCOUNTER — Ambulatory Visit

## 2023-11-09 ENCOUNTER — Encounter: Payer: Self-pay | Admitting: Podiatry

## 2023-11-09 ENCOUNTER — Ambulatory Visit: Admitting: Podiatry

## 2023-11-09 ENCOUNTER — Ambulatory Visit: Payer: Self-pay | Admitting: Cardiology

## 2023-11-09 DIAGNOSIS — L97522 Non-pressure chronic ulcer of other part of left foot with fat layer exposed: Secondary | ICD-10-CM | POA: Diagnosis not present

## 2023-11-09 MED ORDER — JUVEN PO PACK: 1.0000 | PACK | Freq: Two times a day (BID) | ORAL | 2 refills | Status: AC

## 2023-11-09 MED ORDER — MUPIROCIN 2 % EX OINT
1.0000 | TOPICAL_OINTMENT | Freq: Two times a day (BID) | CUTANEOUS | 2 refills | Status: AC
Start: 2023-11-09 — End: ?

## 2023-11-09 NOTE — Progress Notes (Signed)
 Offload made to left insert to reduce pressure off wound  Advised to not wear shoes until healed  Lolita Schultze CPed, CFo, CFm

## 2023-11-09 NOTE — Progress Notes (Addendum)
 Subjective: Chief Complaint  Patient presents with   Foot Ulcer    left foot ulcer Plantar sub met. 1. No feeling in foot. A1C 6.4. Using Hydrofera blue to treat.     73 year old female presents the office today with a family member, sister for the above concerns.  She has been using Hydrofera Blue on the wound.  Seen about the same.  She did complete the course of antibiotics.  She does not report any fevers or chills.    She is on Plavix .   Objective: AAO x3, NAD DP/PT pulses palpable bilaterally, CRT less than 3 seconds Protective sensation absent with Semmes Weinstein monofilament. Significant left bunion with an ulceration underlying this area really is present with hyperkeratotic periwound.  Wound measures 1 x 1 x 0.3 cm with granular wound base.  Hyperkeratotic periwound.  There is no probing, undermining or tunneling.  There is no fluctuation or crepitation.  There is no malodor.  Wound is still a small prior to debridement of the hyperkeratotic periwound.  Wound does not probe to bone although it is close but not able to palpate or visualize any bone.  Will close remove erythema likely more from inflammation there is no drainage or pus.  No ascending cellulitis.  No malodor.  No pain with calf compression, swelling, warmth, erythema    Assessment: 73 year old female with symptomatic onychosis, ulceration right foot  Plan:  Ulceration right foot - Medically necessary wound debridement was performed today.  I sharply debrided the wound with a #312 with scalpel to remove nonviable, devitalized tissue in order to promote wound healing.  Debrided down to healthy, granular tissue.  No significant blood loss.  Cleaned the area with saline.  Antibiotic ointment was applied followed by dressing.  Discussed daily dressing changes.  May try to order her BlastX again as she responded well to this previously.  Will also order collagen powder. [employer] company asking for an order sheet). Remain  in surgical shoe with peg assist which she is wearing today as well.   Return in about 2 weeks (around 11/23/2023) for ulcer.  [name] DPM

## 2023-11-13 ENCOUNTER — Encounter: Attending: Psychology | Admitting: Psychology

## 2023-11-13 ENCOUNTER — Encounter: Payer: Self-pay | Admitting: Psychology

## 2023-11-13 DIAGNOSIS — Z794 Long term (current) use of insulin: Secondary | ICD-10-CM | POA: Insufficient documentation

## 2023-11-13 DIAGNOSIS — R413 Other amnesia: Secondary | ICD-10-CM | POA: Insufficient documentation

## 2023-11-13 DIAGNOSIS — G4733 Obstructive sleep apnea (adult) (pediatric): Secondary | ICD-10-CM | POA: Insufficient documentation

## 2023-11-13 DIAGNOSIS — Z8673 Personal history of transient ischemic attack (TIA), and cerebral infarction without residual deficits: Secondary | ICD-10-CM | POA: Diagnosis not present

## 2023-11-13 DIAGNOSIS — I69398 Other sequelae of cerebral infarction: Secondary | ICD-10-CM | POA: Insufficient documentation

## 2023-11-13 DIAGNOSIS — E1165 Type 2 diabetes mellitus with hyperglycemia: Secondary | ICD-10-CM | POA: Insufficient documentation

## 2023-11-13 DIAGNOSIS — F331 Major depressive disorder, recurrent, moderate: Secondary | ICD-10-CM | POA: Diagnosis not present

## 2023-11-13 DIAGNOSIS — R5383 Other fatigue: Secondary | ICD-10-CM | POA: Insufficient documentation

## 2023-11-13 NOTE — Progress Notes (Signed)
 Neuropsychology Visit  Patient:  [name]   DOB: Jan 26, 1951  MR Number: [id number]  Location: [facility] [place] [facility] [address] [place], [address] [place] [address] Dept: [phone]  Date of Service: 11/13/2023  Start: 1 PM End: 2 PM  Duration of Service: 1 Hour  Provider/Observer:     Norleen JONELLE Asa PsyD  Chief Complaint:      Chief Complaint  Patient presents with   Depression   Memory Loss   Fatigue   Sleeping Problem   Cerebrovascular Accident    Reason For Service:     [name] is a 73 year old female referred for neuropsychological consultation by her treating physiatrist, Dr. Joesph Likes, due to ongoing memory difficulties, depression, fatigue, and multiple falls.  Medical History: Medication Adherence: The patient has not been consistent with taking her medications regularly in the past. Sleep and Eating Habits: She has been sleeping all day and eating only one meal a day prior to moving in with her sister. Significant sleep disturbances and insomnia are noted at night. Rehabilitation Admissions: The patient has had two admissions to a comprehensive inpatient rehabilitation unit over the past year.  I saw her 1 time during her first admission. Depression: She has had depression for many years. 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 Social Isolation: She has been self-isolated for some time and has not been taking care of herself until recently moving in with her sister. Cancer History: The patient's medical history includes a lumpectomy due to ductal carcinoma in the left breast with a radioactive seed in 2021. Sleep Apnea: She has been diagnosed with obstructive sleep apnea in the past but has not been using CPAP. Chronic Conditions: Chronic kidney disease and hyperlipidemia are also noted, along with past strokes. Psychotropic  Medications: Past psychotropic medications include Lexapro  and Wellbutrin . She has taken trazodone in the past to help with sleep. Neurology Follow-Up: The patient was followed by Dr. Georjean in neurology with concerns around memory dating back to at least 2021. Previous MRIs indicated multiple areas of cerebrovascular-related brain changes. Neuropsychological Evaluation: A neuropsychological evaluation performed in 2021 did not show consistencies with a progressive neurological disorder. Memory difficulties and cognitive changes were felt to be related to previous cerebrovascular accidents. Cerebrovascular Events: The patient has had both chronic cortical-based infarcts within the bilateral frontal and left parietal lobes and multifocal bilateral acute ischemia primarily affecting deep gray matter. Two cortical foci within the right MCA were noted in a May 2021 event. Current Status: BiPAP Usage: The patient reports that after her rehab stints, she began using her BiPAP machine more regularly and sleeps between 1 and 5 hours a night with her mask. She gets up 2-4 times a night to urinate and still wakes up tired. She likes to take naps during the day. Living Situation: The patient now lives with her sister, is on a regular schedule, and is consistently taking her medications. She is eating more appropriately and has been attending her doctor's appointments. Exercise and Therapy: However, she is regularly argumentative with her sister about doing her daily exercises and physical therapy. Independence: The patient has not been independent and has not been driving since her strokes. She has not been able to take care of herself in her own home but still has her home and car, although she is not driving or maintaining her household. She harbors the hope of  being able to drive and gain more independence but has not been actively doing her therapies without regular reminders, and even then, her participation is  limited and much less than recommended.  Participation Level:   Active  Participation Quality:  Inattentive      Behavioral Observation:  Well Groomed, Alert, and Appropriate.   Current Psychosocial Factors: Reports depression affecting motivation, attention, concentration, and appetite. Previously lived alone, leading to poor sleep hygiene (sleeping in afternoon), poor diet (one meal per day from a restaurant), and social isolation. Now lives with sister, which has improved diet and structure. Reports anxiety related to medical procedures, specifically dental appointments and mammograms.  Content of Session:   Reviewed progress since last session in June. Discussed management of obstructive sleep apnea, including issues with BiPAP compliance and mask fit (blow-by). Psychoeducation provided on the importance of consistent BiPAP use for the full duration of sleep to improve REM sleep and subsequently mood. Explored coping strategies for anxiety, particularly related to dental visits, using principles of acknowledging truth over catastrophizing. Discussed the importance of engaging in enjoyable, meaningful activities (e.g., drawing, making maps) to combat depressive symptoms and improve quality of life. Provided advice on adjusting the brakes of the rolling walker for safety.  Effectiveness of Interventions: Engaged well with psychoeducation and problem-solving discussions. Reported a recent successful dental visit attributed to finding a clinician she felt comfortable with. Appears to be implementing lifestyle changes (e.g., regular meals) since moving in with her sister. Expressed willingness to try increasing BiPAP use and re-engaging in hobbies.  Target Goals:   Focus on managing depression and improving sleep hygiene. Increase independence and safety with mobility aids. Continue to address anxiety related to medical appointments.  Goals Last Reviewed:   11/13/2023  Goals Addressed Today:     Addressed BiPAP compliance and troubleshooting mask issues. Explored strategies to manage anxiety and catastrophizing related to dental care. Encouraged re-engagement in previously enjoyed activities like drawing and map-making to improve mood and cognitive engagement. Addressed safety concerns by providing instruction on walker brake adjustment.  Impression/Diagnosis:   [name] is a 73 year old female referred for neuropsychological consultation by her treating physiatrist, Dr. Joesph Likes, due to ongoing memory difficulties, depression, fatigue, and multiple falls. Medical History: Medication: Inconsistent adherence. Sleep/Eating: Slept all day, ate one meal before moving in with sister; insomnia at night. Rehabilitation: Two admissions in the past year. Depression: Long-standing. 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 Isolation: Neglected self-care until moving in with sister. Cancer: Lumpectomy for ductal carcinoma (2021). Sleep Apnea: Diagnosed but not using CPAP. Chronic Conditions: Kidney disease, hyperlipidemia, past strokes. Medications: Lexapro , Wellbutrin , trazodone for sleep. Neurology: Memory concerns since 2021; MRIs showed cerebrovascular changes. Neuropsychology: 2021 evaluation linked memory issues to cerebrovascular accidents. Cerebrovascular Events: Chronic infarcts in frontal and parietal lobes; acute ischemia in deep gray matter; cortical foci in right MCA (May 2021). Current Status: BiPAP: Uses regularly, sleeps 1-5 hours, wakes up tired, naps during the day. Living Situation: With sister, follows schedule, takes medications, eats better, attends appointments. Exercise/Therapy: Argumentative about daily exercises. Independence: Not driving or independent since strokes; hopes to drive but needs reminders for therapy.  Diagnosis:   Major depressive disorder, recurrent episode, moderate (HCC)  History of CVA (cerebrovascular accident)  Memory  deficit  Obstructive sleep apnea  Type 2 diabetes mellitus with hyperglycemia, with long-term current use of insulin   Fatigue as sequela of cerebrovascular accident (CVA)    Norleen Asa, Psy.D. Clinical Psychologist Neuropsychologist

## 2023-11-14 ENCOUNTER — Telehealth: Payer: Self-pay | Admitting: Podiatry

## 2023-11-14 DIAGNOSIS — L97522 Non-pressure chronic ulcer of other part of left foot with fat layer exposed: Secondary | ICD-10-CM | POA: Diagnosis not present

## 2023-11-14 NOTE — Telephone Encounter (Signed)
 [facility] PA RCVD AND APPROVED  AUTH [phone]  09/27/23-12/26/23

## 2023-11-24 DIAGNOSIS — G4733 Obstructive sleep apnea (adult) (pediatric): Secondary | ICD-10-CM | POA: Diagnosis not present

## 2023-11-26 DIAGNOSIS — N390 Urinary tract infection, site not specified: Secondary | ICD-10-CM | POA: Diagnosis not present

## 2023-11-28 DIAGNOSIS — F331 Major depressive disorder, recurrent, moderate: Secondary | ICD-10-CM | POA: Diagnosis not present

## 2023-11-28 DIAGNOSIS — G4733 Obstructive sleep apnea (adult) (pediatric): Secondary | ICD-10-CM | POA: Diagnosis not present

## 2023-11-28 DIAGNOSIS — F411 Generalized anxiety disorder: Secondary | ICD-10-CM | POA: Diagnosis not present

## 2023-12-05 NOTE — Progress Notes (Signed)
 Patient presents today to pick up diabetic shoes and insoles.  Patient was dispensed 1 pair of diabetic shoes and 3 custom Left inserts and 1 Right Toe filler  Fit was satisfactory. Instructions for break-in and wear was reviewed and a copy was given to the patient.   Re-appointment for regularly scheduled diabetic foot care visits or if they should experience any trouble with the shoes or insoles.   Lolita Schultze CPed, CFo, CFm

## 2023-12-07 ENCOUNTER — Ambulatory Visit (INDEPENDENT_AMBULATORY_CARE_PROVIDER_SITE_OTHER)

## 2023-12-07 DIAGNOSIS — I639 Cerebral infarction, unspecified: Secondary | ICD-10-CM

## 2023-12-07 DIAGNOSIS — E669 Obesity, unspecified: Secondary | ICD-10-CM | POA: Diagnosis not present

## 2023-12-07 DIAGNOSIS — N183 Chronic kidney disease, stage 3 unspecified: Secondary | ICD-10-CM | POA: Diagnosis not present

## 2023-12-07 DIAGNOSIS — F3341 Major depressive disorder, recurrent, in partial remission: Secondary | ICD-10-CM | POA: Diagnosis not present

## 2023-12-07 DIAGNOSIS — Z23 Encounter for immunization: Secondary | ICD-10-CM | POA: Diagnosis not present

## 2023-12-07 DIAGNOSIS — E1121 Type 2 diabetes mellitus with diabetic nephropathy: Secondary | ICD-10-CM | POA: Diagnosis not present

## 2023-12-07 DIAGNOSIS — I129 Hypertensive chronic kidney disease with stage 1 through stage 4 chronic kidney disease, or unspecified chronic kidney disease: Secondary | ICD-10-CM | POA: Diagnosis not present

## 2023-12-07 DIAGNOSIS — E559 Vitamin D deficiency, unspecified: Secondary | ICD-10-CM | POA: Diagnosis not present

## 2023-12-07 DIAGNOSIS — Z8673 Personal history of transient ischemic attack (TIA), and cerebral infarction without residual deficits: Secondary | ICD-10-CM | POA: Diagnosis not present

## 2023-12-07 DIAGNOSIS — Z794 Long term (current) use of insulin: Secondary | ICD-10-CM | POA: Diagnosis not present

## 2023-12-07 DIAGNOSIS — E1142 Type 2 diabetes mellitus with diabetic polyneuropathy: Secondary | ICD-10-CM | POA: Diagnosis not present

## 2023-12-07 DIAGNOSIS — E11621 Type 2 diabetes mellitus with foot ulcer: Secondary | ICD-10-CM | POA: Diagnosis not present

## 2023-12-07 DIAGNOSIS — L97521 Non-pressure chronic ulcer of other part of left foot limited to breakdown of skin: Secondary | ICD-10-CM | POA: Diagnosis not present

## 2023-12-11 LAB — CUP PACEART REMOTE DEVICE CHECK
Date Time Interrogation Session: 20250922031300
Implantable Pulse Generator Implant Date: 20240626
Pulse Gen Serial Number: 105871

## 2023-12-12 DIAGNOSIS — F331 Major depressive disorder, recurrent, moderate: Secondary | ICD-10-CM | POA: Diagnosis not present

## 2023-12-12 NOTE — Progress Notes (Signed)
 Remote Loop Recorder Transmission

## 2023-12-13 ENCOUNTER — Ambulatory Visit: Payer: Self-pay | Admitting: Cardiology

## 2023-12-13 ENCOUNTER — Other Ambulatory Visit (HOSPITAL_COMMUNITY): Payer: Self-pay

## 2023-12-13 DIAGNOSIS — Z89411 Acquired absence of right great toe: Secondary | ICD-10-CM | POA: Diagnosis not present

## 2023-12-13 DIAGNOSIS — E1142 Type 2 diabetes mellitus with diabetic polyneuropathy: Secondary | ICD-10-CM | POA: Diagnosis not present

## 2023-12-13 DIAGNOSIS — I129 Hypertensive chronic kidney disease with stage 1 through stage 4 chronic kidney disease, or unspecified chronic kidney disease: Secondary | ICD-10-CM | POA: Diagnosis not present

## 2023-12-13 DIAGNOSIS — E559 Vitamin D deficiency, unspecified: Secondary | ICD-10-CM | POA: Diagnosis not present

## 2023-12-13 DIAGNOSIS — Z794 Long term (current) use of insulin: Secondary | ICD-10-CM | POA: Diagnosis not present

## 2023-12-13 NOTE — Progress Notes (Signed)
 Remote Loop Recorder Transmission

## 2023-12-15 DIAGNOSIS — N184 Chronic kidney disease, stage 4 (severe): Secondary | ICD-10-CM | POA: Diagnosis not present

## 2023-12-20 DIAGNOSIS — E1122 Type 2 diabetes mellitus with diabetic chronic kidney disease: Secondary | ICD-10-CM | POA: Diagnosis not present

## 2023-12-20 DIAGNOSIS — D631 Anemia in chronic kidney disease: Secondary | ICD-10-CM | POA: Diagnosis not present

## 2023-12-20 DIAGNOSIS — N1832 Chronic kidney disease, stage 3b: Secondary | ICD-10-CM | POA: Diagnosis not present

## 2023-12-20 DIAGNOSIS — I129 Hypertensive chronic kidney disease with stage 1 through stage 4 chronic kidney disease, or unspecified chronic kidney disease: Secondary | ICD-10-CM | POA: Diagnosis not present

## 2023-12-24 DIAGNOSIS — G4733 Obstructive sleep apnea (adult) (pediatric): Secondary | ICD-10-CM | POA: Diagnosis not present

## 2023-12-25 ENCOUNTER — Ambulatory Visit: Admitting: Podiatry

## 2023-12-25 ENCOUNTER — Encounter: Payer: Self-pay | Admitting: Podiatry

## 2023-12-25 VITALS — BP 155/69 | HR 60 | Temp 97.2°F

## 2023-12-25 DIAGNOSIS — L97522 Non-pressure chronic ulcer of other part of left foot with fat layer exposed: Secondary | ICD-10-CM | POA: Diagnosis not present

## 2023-12-25 DIAGNOSIS — E1142 Type 2 diabetes mellitus with diabetic polyneuropathy: Secondary | ICD-10-CM | POA: Diagnosis not present

## 2023-12-25 NOTE — Progress Notes (Signed)
 Subjective:  [name], female    DOB: [birth year],   MRN: [id number]  Chief Complaint  Patient presents with   Diabetic Ulcer    It's bleeding at the moment and my ankle is swollen.  Is she going to trim my toenails.  Saw Reyes Alexander - 12/13/2023; A1c - ?    73 y.o. female presents for follow-up of left foot wound. She regularly follows with Dr. Gershon. She has been keeping mupirocin  on it. Did get some Blastx sent to the house but have not been using it. Relates it has been bleeding and her ankle is swollen.  She has not been seen since 8/26. She didn't get her appointment scheduled at last visit and was unable to get in any sooner.  . Denies any other pedal complaints. Denies n/v/f/c.   Past Medical History:  Diagnosis Date   AKI (acute kidney injury) 08/09/2022   Breast cancer (HCC) 12/2019   left breast DCIS   CKD (chronic kidney disease) 06/07/2016   Stage 2, GFR 60-89 ml/min   Diabetic polyneuropathy 06/07/2016   Dyslipidemia    Endometrial adenocarcinoma 2012   Fever blister    Hyperlipidemia    Hypertension, essential 06/07/2016   Major depressive disorder    Mixed dyslipidemia 06/07/2016   MRSA infection 10/2019   left foot wound   Obstructive sleep apnea 06/07/2016   does not use CPAP   Peripheral neuropathy    Retinopathy    Right rib fracture 08/09/2022   Severe obesity    Skin ulcer of left foot with fat layer exposed 11/02/2016   Stroke    Asymptomatic, discovered via neuroimaging; small infarct in left parietal lobe; also concern for small b/l frontal infarcts   Tachycardia 07/19/2017   Traumatic rhabdomyolysis 08/09/2022   Type 2 diabetes mellitus with hyperglycemia, with long-term current use of insulin  06/07/2016   Unilateral primary osteoarthritis, right knee 04/16/2019   UTI (urinary tract infection) 08/09/2022    Objective:  Physical Exam: Vascular: DP/PT pulses 2/4 bilateral. CFT <3 seconds. Feet cold to touch. Absent hair  growth on digits. Edema noted to bilateral lower extremities. Xerosis noted bilaterally.  Skin. No lacerations or abrasions bilateral feet. Nails 1-5 bilateral  are thickened discolored and  with subungual debris. Plantar first metatarsal head wound on left with granular base and surrounding hyperkeratosisi. No erythema edema or purulence noted. No probe to bone.  Musculoskeletal: MMT 5/5 bilateral lower extremities in DF, PF, Inversion and Eversion. Deceased ROM in DF of ankle joint.  Neurological: Sensation intact to light touch. Protective sensation diminished bilateral.    Assessment:   1. Foot ulcer, left, with fat layer exposed (HCC)   2. Diabetic peripheral neuropathy associated with type 2 diabetes mellitus (HCC)      Plan:  Patient was evaluated and treated and all questions answered. Ulcer plantar first metatarsal head on left with fat layer exposed 3 -Debridement as below. -Dressed with prisma, DSD. Continue blastx at home.  -Off-loading with surgical shoe and pegassist.  -No abx indicated.  -Discussed glucose control and proper protein-rich diet.  -Discussed if any worsening redness, pain, fever or chills to call or may need to report to the emergency room. Patient expressed understanding.    Procedure: Excisional Debridement of Wound Tool: Sharp #312 chisel blade/tissue nipper Type of Debridement: Sharp Excisional Frequency: Every two weeks until appropriately healed.  Dressing is to be changed daily/keeping the wound clean and dry Rationale: Removal of non-viable soft tissue from  the wound to promote healing.  Anesthesia: none Pre-Debridement Wound Measurements: 0.3 cm x 0.4 cm x 0.2 cm  Post-Debridement Wound Measurements: 1 cm x 0.6 cm x 0.2 cm  Area devitalized tissue removed(nonviable tissue only): 0.6 cm x 0.3 cm.  Blood loss: Minimal (<10cc) Depth of Debridement: with fat layer exposed Description of tissue removed: Devitalized Tissue and Non-viable  tissue Technique: The wound and the surrounding skin were prepped and draped in usual aseptic fashion.  Aseptic technique was maintained throughout the procedure.  Using #312 blade/tissue nipper sharp debridement of necrotic/nonviable tissue was performed until healthy bleeding wound bed was achieved.  No underlying bone or tendon was exposed during debridement.  The wound was thoroughly irrigated with normal saline solution Wound Progress:  Current Wound Volume: Debridement was performed of the chronic nonhealing diabetic foot wound on plantar left first metatarsal head.  Debridement removed 0.6 cm x 0.3 cm of the necrotic tissue and subcutaneous tissue and  purulent drainage was not present. Presence/absence of tissue: Necrotic tissue/nonviable tissue present at the base of the wound.  Sharp debridement was performed to remove the necrotic tissue/nonviable tissue back to viable tissue.  No devitalized/nonviable tissue present postdebridement.  Wound appeared clean and clear of infection No material in the wound was present that was identified to be inhibiting healing. Dressing: Dry, sterile, compression dressing. Disposition: Patient tolerated procedure well. Patient to return in 2 weeks for follow-up or as listed above.  No follow-ups on file.   Asberry Failing, DPM

## 2023-12-26 NOTE — Progress Notes (Signed)
 Remote Loop Recorder Transmission

## 2024-01-05 ENCOUNTER — Ambulatory Visit: Admitting: Podiatry

## 2024-01-05 ENCOUNTER — Ambulatory Visit

## 2024-01-05 VITALS — Ht 69.5 in | Wt 231.8 lb

## 2024-01-05 DIAGNOSIS — L97522 Non-pressure chronic ulcer of other part of left foot with fat layer exposed: Secondary | ICD-10-CM | POA: Diagnosis not present

## 2024-01-05 DIAGNOSIS — B351 Tinea unguium: Secondary | ICD-10-CM

## 2024-01-05 DIAGNOSIS — M79674 Pain in right toe(s): Secondary | ICD-10-CM | POA: Diagnosis not present

## 2024-01-05 DIAGNOSIS — M79675 Pain in left toe(s): Secondary | ICD-10-CM | POA: Diagnosis not present

## 2024-01-05 NOTE — Progress Notes (Addendum)
 Subjective:  [name], female    DOB: [birth year],   MRN: [id number]  Chief Complaint  Patient presents with   Wound Check    Rm 11 Patient is here for ulcer of the left foot. Wound is open with minimum drainage. Pt request possible nail trim.     73 y.o. female presents for follow-up of left foot wound.  She has been using BlastX and collagen on the ulcer.  She states that is doing better.  No drainage or pus.  No swelling or redness.  Does not report any fevers or chills.  She also states that she does have her nails trimmed this to become elongated causing discomfort.    Objective:  Physical Exam: Vascular: DP/PT pulses 2/4 bilateral. CFT <3 seconds. Feet cold to touch. Absent hair growth on digits. Edema noted to bilateral lower extremities. Xerosis noted bilaterally.  Skin. Remaining nails are hypertrophic, dystrophic nail discoloration. Nails affected are 1, 2, 4 on the left and 2 through 5 on the right.  Tenderness to the associated toenails.  Ulceration noted submetatarsal 1 as noted below.  There is no probing to [place] or tunneling but there is no surrounding erythema, ascending cellulitis.  No fluctuation or crepitation Musculoskeletal: Significant bunion noted which has been chronic.  There is no other areas of tenderness.   Neurological: Protective sensation diminished bilateral.    Assessment:   1. Foot ulcer, left, with fat layer exposed (HCC)   2. Diabetic peripheral neuropathy associated with type 2 diabetes mellitus (HCC)      Plan:  Patient was evaluated and treated and all questions answered. Ulcer plantar first metatarsal head on left with fat layer exposed  -Debridement as below. -Dressed with prisma, DSD. Continue blastx at home.  -Off-loading with surgical shoe and pegassist.  -No abx indicated.  Continue to monitor for any signs or symptoms of infection. -Discussed glucose control and proper protein-rich diet.  -Discussed if any  worsening redness, pain, fever or chills to call or may need to report to the emergency room. Patient expressed understanding.    Procedure: Excisional Debridement of Wound Tool: Sharp #312 chisel blade/tissue nipper Type of Debridement: Sharp Excisional Frequency: Every two weeks until appropriately healed.  Dressing is to be changed daily/keeping the wound clean and dry Rationale: Removal of non-viable soft tissue from the wound to promote healing.  Anesthesia: none Pre-Debridement Wound Measurements: 0.6 cm x 0.3 cm x 0.2 cm  Post-Debridement Wound Measurements: 1 cm x 0.5 cm x 0.2 cm  Area devitalized tissue removed(nonviable tissue only): 1 cm x 0.5 cm.  Blood loss: Minimal (<10cc) Depth of Debridement: with fat layer exposed Description of tissue removed: Devitalized Tissue and Non-viable tissue Technique: The wound and the surrounding skin were prepped and draped in usual aseptic fashion.  Aseptic technique was maintained throughout the procedure.  Using #312 blade/tissue nipper sharp debridement of necrotic/nonviable tissue was performed until healthy bleeding wound bed was achieved.  No underlying bone or tendon was exposed during debridement.  The wound was thoroughly irrigated with normal saline solution Wound Progress:  Current Wound Volume: Debridement was performed of the chronic nonhealing diabetic foot wound on plantar left first metatarsal head.  Debridement removed 1 cm x 0.5 cm of the necrotic tissue and subcutaneous tissue and  purulent drainage was not present. Presence/absence of tissue: Necrotic tissue/nonviable tissue present at the base of the wound.  Sharp debridement was performed to remove the necrotic tissue/nonviable tissue back to viable  tissue.  No devitalized/nonviable tissue present postdebridement.  Wound appeared clean and clear of infection No material in the wound was present that was identified to be inhibiting healing. Dressing: Small amount of antibiotic  ointment is applied followed by Prisma and a dry, sterile, compression dressing. Disposition: Patient tolerated procedure well. Patient to return in 2 weeks for follow-up or as listed above.  Symptomatic onychomycosis - Sharply debriding nails x 7 without any complications or bleeding  Radiology: X-rays obtained reviewed.  Multiple views obtained the left foot.  Significant arthritic changes present with bunion noted.  There is no definitive evidence of osteomized at this time. Chronic changes noted.   Return in about 2 weeks (around 01/19/2024).  [name] DPM

## 2024-01-07 ENCOUNTER — Encounter

## 2024-01-10 DIAGNOSIS — G4733 Obstructive sleep apnea (adult) (pediatric): Secondary | ICD-10-CM | POA: Diagnosis not present

## 2024-01-11 ENCOUNTER — Ambulatory Visit

## 2024-01-11 DIAGNOSIS — I639 Cerebral infarction, unspecified: Secondary | ICD-10-CM | POA: Diagnosis not present

## 2024-01-11 LAB — CUP PACEART REMOTE DEVICE CHECK
Date Time Interrogation Session: 20251020021700
Implantable Pulse Generator Implant Date: 20240626
Pulse Gen Serial Number: 105871

## 2024-01-12 NOTE — Progress Notes (Signed)
 Remote Loop Recorder Transmission

## 2024-01-15 ENCOUNTER — Ambulatory Visit: Payer: Self-pay | Admitting: Cardiology

## 2024-01-23 ENCOUNTER — Ambulatory Visit: Admitting: Podiatry

## 2024-01-23 ENCOUNTER — Encounter: Payer: Self-pay | Admitting: Podiatry

## 2024-01-23 DIAGNOSIS — E11621 Type 2 diabetes mellitus with foot ulcer: Secondary | ICD-10-CM

## 2024-01-23 DIAGNOSIS — L97422 Non-pressure chronic ulcer of left heel and midfoot with fat layer exposed: Secondary | ICD-10-CM

## 2024-01-23 NOTE — Progress Notes (Signed)
 Subjective:  [name], female    DOB: [birth year],   MRN: [id number]  Chief Complaint  Patient presents with   Diabetic Ulcer    Rm13 Left foot ulceration f/u / patient says she is doing well.     73 y.o. female presents for follow-up of left foot wound.  She has been using BlastX and collagen on the ulcer.  She does not report any purulence.  She does get occasional bloody clear drainage.  No present swelling or redness.  No fevers or chills.  No other concerns today.   Objective:  Physical Exam: Vascular: DP/PT pulses 2/4 bilateral. CFT <3 seconds. Feet cold to touch. Absent hair growth on digits. Edema noted to bilateral lower extremities. Xerosis noted bilaterally.  Skin.  Ulceration noted on the area the bunion left foot.  Today the wound measures 1 x 0.5 x 0.2 cm and about the same in size compared to what it was last appointment.  I did improvement the wound was slightly smaller measuring 0.7 x 0.4 x 0.1 cm.  There is no surrounding erythema, ascending cellulitis.  No fluctuation or crepitation but is noted. Musculoskeletal: Significant bunion noted which has been chronic.  There is no other areas of tenderness.   Neurological: Protective sensation diminished bilateral.    Assessment:   1. Foot ulcer, left, with fat layer exposed (HCC)   2. Diabetic peripheral neuropathy associated with type 2 diabetes mellitus (HCC)      Plan:  Patient was evaluated and treated and all questions answered. Ulcer plantar first metatarsal head on left with fat layer exposed  -Debridement as below. -Dressed with prisma, DSD. -Off-loading with surgical shoe and pegassist.  -No abx indicated.  Continue to monitor for any signs or symptoms of infection. -Discussed glucose control and proper protein-rich diet.  -Discussed if any worsening redness, pain, fever or chills to call or may need to report to the emergency room. Patient expressed understanding.    Procedure:  Excisional Debridement of Wound Tool: Sharp #312 chisel blade/tissue nipper Type of Debridement: Sharp Excisional Frequency: Every two weeks until appropriately healed.  Dressing is to be changed daily/keeping the wound clean and dry Rationale: Removal of non-viable soft tissue from the wound to promote healing.  Anesthesia: none Pre-Debridement Wound Measurements: 0.7 cm x 0.4 cm x 0.2 cm  Post-Debridement Wound Measurements: 1 cm x 0.5 cm x 0.2 cm  Area devitalized tissue removed(nonviable tissue only): 1 cm x 0.5 cm.  Blood loss: Minimal (<10cc) Depth of Debridement: with fat layer exposed Description of tissue removed: Devitalized Tissue and Non-viable tissue Technique: The wound and the surrounding skin were prepped and draped in usual aseptic fashion.  Aseptic technique was maintained throughout the procedure.  Using #312 blade/tissue nipper sharp debridement of necrotic/nonviable tissue was performed until healthy bleeding wound bed was achieved.  No underlying bone or tendon was exposed during debridement.  The wound was thoroughly irrigated with normal saline solution Wound Progress:  Current Wound Volume: Debridement was performed of the chronic nonhealing diabetic foot wound on plantar left first metatarsal head.  Debridement removed 1 cm x 0.5 cm of the necrotic tissue and subcutaneous tissue and  purulent drainage was not present. Presence/absence of tissue: Necrotic tissue/nonviable tissue present at the base of the wound.  Sharp debridement was performed to remove the necrotic tissue/nonviable tissue back to viable tissue.  No devitalized/nonviable tissue present postdebridement.  Wound appeared clean and clear of infection No material in the wound  was present that was identified to be inhibiting healing. Dressing: Small amount of antibiotic ointment is applied followed by Prisma and a dry, sterile, compression dressing. Disposition: Patient tolerated procedure well. Patient to  return in 2 weeks for follow-up or as listed above.  Ointment and Prisma was applied.  Discussed daily dressing changes at home but do not mix the BlastX with the collagen.  [name] DPM

## 2024-01-25 ENCOUNTER — Encounter: Attending: Psychology | Admitting: Psychology

## 2024-01-25 DIAGNOSIS — E1165 Type 2 diabetes mellitus with hyperglycemia: Secondary | ICD-10-CM | POA: Diagnosis not present

## 2024-01-25 DIAGNOSIS — R413 Other amnesia: Secondary | ICD-10-CM | POA: Insufficient documentation

## 2024-01-25 DIAGNOSIS — Z8673 Personal history of transient ischemic attack (TIA), and cerebral infarction without residual deficits: Secondary | ICD-10-CM | POA: Diagnosis not present

## 2024-01-25 DIAGNOSIS — Z794 Long term (current) use of insulin: Secondary | ICD-10-CM | POA: Insufficient documentation

## 2024-01-25 DIAGNOSIS — F331 Major depressive disorder, recurrent, moderate: Secondary | ICD-10-CM | POA: Diagnosis not present

## 2024-01-25 DIAGNOSIS — G4733 Obstructive sleep apnea (adult) (pediatric): Secondary | ICD-10-CM | POA: Diagnosis not present

## 2024-01-25 NOTE — Progress Notes (Signed)
 Neuropsychology Visit  Patient:  [name]   DOB: 23-Aug-1950  MR Number: [id number]  Location: [facility] [address] [place], [address] [place] [address] Dept: [phone]  Date of Service: 01/25/2024  Start: 1 PM End: 2 PM  Duration of Service: 1 Hour  Provider/Observer:     Norleen JONELLE Asa PsyD  Chief Complaint:      Chief Complaint  Patient presents with   Depression   Memory Loss   Sleeping Problem   Cerebrovascular Accident    Reason For Service:     [name] is a 73 year old female referred for neuropsychological consultation by her treating physiatrist, Dr. Joesph Likes, due to ongoing memory difficulties, depression, fatigue, and multiple falls.  Medical History: Medication Adherence: The patient has not been consistent with taking her medications regularly in the past. Sleep and Eating Habits: She has been sleeping all day and eating only one meal a day prior to moving in with her sister. Significant sleep disturbances and insomnia are noted at night. Rehabilitation Admissions: The patient has had two admissions to a comprehensive inpatient rehabilitation unit over the past year.  I saw her 1 time during her first admission. Depression: She has had depression for many years. 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 Social Isolation: She has been self-isolated for some time and has not been taking care of herself until recently moving in with her sister. Cancer History: The patient's medical history includes a lumpectomy due to ductal carcinoma in the left breast with a radioactive seed in 2021. Sleep Apnea: She has been diagnosed with obstructive sleep apnea in the past but has not been using CPAP. Chronic Conditions: Chronic kidney disease and hyperlipidemia are also noted, along with past strokes. Psychotropic Medications:  Past psychotropic medications include Lexapro  and Wellbutrin . She has taken trazodone in the past to help with sleep. Neurology Follow-Up: The patient was followed by Dr. Georjean in neurology with concerns around memory dating back to at least 2021. Previous MRIs indicated multiple areas of cerebrovascular-related brain changes. Neuropsychological Evaluation: A neuropsychological evaluation performed in 2021 did not show consistencies with a progressive neurological disorder. Memory difficulties and cognitive changes were felt to be related to previous cerebrovascular accidents. Cerebrovascular Events: The patient has had both chronic cortical-based infarcts within the bilateral frontal and left parietal lobes and multifocal bilateral acute ischemia primarily affecting deep gray matter. Two cortical foci within the right MCA were noted in a May 2021 event. Current Status: BiPAP Usage: The patient reports that after her rehab stints, she began using her BiPAP machine more regularly and sleeps between 1 and 5 hours a night with her mask. She gets up 2-4 times a night to urinate and still wakes up tired. She likes to take naps during the day. Living Situation: The patient now lives with her sister, is on a regular schedule, and is consistently taking her medications. She is eating more appropriately and has been attending her doctor's appointments. Exercise and Therapy: However, she is regularly argumentative with her sister about doing her daily exercises and physical therapy. Independence: The patient has not been independent and has not been driving since her strokes. She has not been able to take care of herself in her own home but still has her home and car, although she is not driving or maintaining her household. She harbors the hope of being able to  drive and gain more independence but has not been actively doing her therapies without regular reminders, and even then, her participation is limited and  much less than recommended.  During today's clinical visit the patient reports having had no falls since the last visit in August. This is attributed to increased awareness and being more careful, particularly about not reaching too far when trying to be independent of her walker. The brakes on her walker were recently tightened by family members. She reports using the brakes frequently, especially when navigating ramps.  The patient is engaging in rehabilitative efforts, including walking laps and chair exercises. She reports sometimes forgetting to do them. She was initially doing sit-to-stand exercises five times, but was corrected that the target was 12 repetitions. She has since increased her leg exercises to 20 repetitions. She reports being argumentative with her sister regarding daily exercises and physical therapy. Her participation is often limited and less than recommended.  The patient uses a BiPAP machine for obstructive sleep apnea. She received a new mask yesterday as the previous one had significant leakage. Last night was the first time using the new mask, and she reported it did not seal well, with air blowing above her head. She reports sleeping for almost five hours with it. She consistently removes the mask after about four hours of use, viewing this as her target. She expresses dislike for the mask and admits to not using it during daytime naps. She has a history of inconsistent CPAP use. She reports that after her rehab admissions, she began using her BiPAP more regularly, sleeping between 1 and 5 hours with it at night. She wakes 2-4 times a night to urinate and reports still waking up tired.  Participation Level:   Active  Participation Quality:  Inattentive      Behavioral Observation:  Well Groomed, Alert, and Appropriate.   Current Psychosocial Factors: Major stressors include loss of function and independence following her strokes. She harbors a desire to drive again and be  more independent but is not actively participating in therapies to a degree that would support these goals. She has not been driving and is unable to live alone or maintain her own household. She reports feeling anhedonic, stating life feels sucky and that it doesn't seem worth it to engage in challenging activities. This affects her motivation for rehabilitation exercises and consistent BiPAP use.  Content of Session:    Reviewed progress since last session in August, focusing on fall prevention, exercise adherence, and BiPAP usage. A significant portion of the session was dedicated to psychoeducation on obstructive sleep apnea and the critical importance of full-night BiPAP use for both physical and cognitive recovery, including brain healing during REM sleep. The concept of anhedonia was reviewed as a contributor to low motivation. A behavioral activation goal was established to engage in one valued or meaningful activity five days per week.  Treatment Interventions:                 Extensive psychoeducation was provided regarding the importance of consistent BiPAP use for the full duration of sleep. The relationship between untreated sleep apnea, particularly during REM sleep in the second half of the night, and its negative impact on memory, mood, motivation, fatigue, and risk of future cerebrovascular events was explained. The concept of anhedonia was introduced and discussed in the context of her reduced motivation and perception that effortful activities are not worthwhile. A behavioral activation strategy was introduced, focusing on identifying and  engaging in one activity per day that has a reasonable chance of providing a sense of accomplishment or gladness after completion, irrespective of the initial desire to do it.  Effectiveness of Interventions: The patient was receptive to the psychoeducation but remained ambivalent about increasing BiPAP use beyond four hours, viewing this as meeting the  minimum requirement. She acknowledged the rationale but expressed a strong dislike for the device. She seemed to understand the concept of anhedonia and its impact on her motivation. She appeared to engage with the behavioral activation goal, smiling when discussing past enjoyable activities like shopping for purses.  Target Goals:   The primary goals are to address symptoms related to previous cerebrovascular accidents, including cognitive changes and anhedonia, and to increase independence. A key focus is improving adherence to BiPAP therapy to improve sleep quality, cognitive function, mood, and reduce risk of future stroke. Another goal is to increase participation in physical therapy exercises, such as sit-to-stands, to improve strength and reduce fall risk. She is not currently driving and is not ready to attempt the driving test.  Goals Last Reviewed:   01/25/2024  Goals Addressed Today:    Followed up on fall prevention and exercise adherence. Addressed inconsistent BiPAP use and the pattern of removing the mask after four hours. Provided extensive psychoeducation on the function of sleep stages and the consequences of untreated apnea. Addressed anhedonia and its effect on the cost-benefit analysis of engaging in effortful activities. A new behavioral activation goal was set to counter anhedonia and increase engagement in meaningful activities.  Impression/Diagnosis:   [name] is a 73 year old female referred for neuropsychological consultation by her treating physiatrist, Dr. Joesph Likes, due to ongoing memory difficulties, depression, fatigue, and multiple falls. Medical History: Medication: Inconsistent adherence. Sleep/Eating: Slept all day, ate one meal before moving in with sister; insomnia at night. Rehabilitation: Two admissions in the past year. Depression: Long-standing. 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 Isolation: Neglected self-care until moving in with  sister. Cancer: Lumpectomy for ductal carcinoma (2021). Sleep Apnea: Diagnosed but not using CPAP. Chronic Conditions: Kidney disease, hyperlipidemia, past strokes. Medications: Lexapro , Wellbutrin , trazodone for sleep. Neurology: Memory concerns since 2021; MRIs showed cerebrovascular changes. Neuropsychology: 2021 evaluation linked memory issues to cerebrovascular accidents. Cerebrovascular Events: Chronic infarcts in frontal and parietal lobes; acute ischemia in deep gray matter; cortical foci in right MCA (May 2021). Current Status: BiPAP: Uses regularly, sleeps 1-5 hours, wakes up tired, naps during the day. Living Situation: With sister, follows schedule, takes medications, eats better, attends appointments. Exercise/Therapy: Argumentative about daily exercises. Independence: Not driving or independent since strokes; hopes to drive but needs reminders for therapy.  Diagnosis:   Major depressive disorder, recurrent episode, moderate (HCC)  History of CVA (cerebrovascular accident)  Memory deficit  Obstructive sleep apnea  Type 2 diabetes mellitus with hyperglycemia, with long-term current use of insulin     Norleen Asa, Psy.D. Clinical Psychologist Neuropsychologist

## 2024-02-05 DIAGNOSIS — Z1231 Encounter for screening mammogram for malignant neoplasm of breast: Secondary | ICD-10-CM | POA: Diagnosis not present

## 2024-02-07 ENCOUNTER — Encounter

## 2024-02-08 ENCOUNTER — Encounter: Admitting: Psychology

## 2024-02-08 DIAGNOSIS — Z8673 Personal history of transient ischemic attack (TIA), and cerebral infarction without residual deficits: Secondary | ICD-10-CM

## 2024-02-08 DIAGNOSIS — R413 Other amnesia: Secondary | ICD-10-CM | POA: Diagnosis not present

## 2024-02-08 DIAGNOSIS — F331 Major depressive disorder, recurrent, moderate: Secondary | ICD-10-CM | POA: Diagnosis not present

## 2024-02-08 DIAGNOSIS — G4733 Obstructive sleep apnea (adult) (pediatric): Secondary | ICD-10-CM | POA: Diagnosis present

## 2024-02-08 DIAGNOSIS — Z794 Long term (current) use of insulin: Secondary | ICD-10-CM | POA: Diagnosis present

## 2024-02-08 DIAGNOSIS — E1165 Type 2 diabetes mellitus with hyperglycemia: Secondary | ICD-10-CM | POA: Diagnosis present

## 2024-02-11 ENCOUNTER — Ambulatory Visit

## 2024-02-11 DIAGNOSIS — I639 Cerebral infarction, unspecified: Secondary | ICD-10-CM | POA: Diagnosis not present

## 2024-02-12 LAB — CUP PACEART REMOTE DEVICE CHECK
Date Time Interrogation Session: 20251123002100
Implantable Pulse Generator Implant Date: 20240626
Pulse Gen Serial Number: 105871

## 2024-02-13 ENCOUNTER — Ambulatory Visit: Payer: Self-pay | Admitting: Cardiology

## 2024-02-13 NOTE — Progress Notes (Signed)
 Remote Loop Recorder Transmission

## 2024-02-16 ENCOUNTER — Ambulatory Visit

## 2024-02-19 ENCOUNTER — Encounter: Attending: Psychology

## 2024-02-19 DIAGNOSIS — I6930 Unspecified sequelae of cerebral infarction: Secondary | ICD-10-CM | POA: Diagnosis present

## 2024-02-19 DIAGNOSIS — R413 Other amnesia: Secondary | ICD-10-CM | POA: Insufficient documentation

## 2024-02-19 DIAGNOSIS — E1165 Type 2 diabetes mellitus with hyperglycemia: Secondary | ICD-10-CM | POA: Insufficient documentation

## 2024-02-19 DIAGNOSIS — I69398 Other sequelae of cerebral infarction: Secondary | ICD-10-CM | POA: Diagnosis present

## 2024-02-19 DIAGNOSIS — G4733 Obstructive sleep apnea (adult) (pediatric): Secondary | ICD-10-CM | POA: Insufficient documentation

## 2024-02-19 DIAGNOSIS — R5383 Other fatigue: Secondary | ICD-10-CM | POA: Diagnosis present

## 2024-02-19 DIAGNOSIS — Z8673 Personal history of transient ischemic attack (TIA), and cerebral infarction without residual deficits: Secondary | ICD-10-CM | POA: Diagnosis not present

## 2024-02-19 DIAGNOSIS — F331 Major depressive disorder, recurrent, moderate: Secondary | ICD-10-CM | POA: Insufficient documentation

## 2024-02-19 DIAGNOSIS — Z794 Long term (current) use of insulin: Secondary | ICD-10-CM | POA: Insufficient documentation

## 2024-02-19 NOTE — Progress Notes (Signed)
 Behavioral Observations:  The patient appeared well-groomed and appropriately dressed. Her manners were polite and appropriate to the situation. She ambulated with the assistance of a rollating walker. She reported some fatigue at the beginning of the assessment, however this did not appear to impact her engagement with testing. Her attitude towards testing was positive and she demonstrated a good effort.   Neuropsychology Note  [name] completed 125 minutes of neuropsychological testing with technician, Josue Ned, BA, under the supervision of Norleen Asa, PsyD., Clinical Neuropsychologist. The patient did not appear overtly distressed by the testing session, per behavioral observation or via self-report to the technician. Rest breaks were offered.   Clinical Decision Making: In considering the patient's current level of functioning, level of presumed impairment, nature of symptoms, emotional and behavioral responses during clinical interview, level of literacy, and observed level of motivation/effort, a battery of tests was selected by Dr. Asa during initial consultation on 01/25/2024. This was communicated to the technician. Communication between the neuropsychologist and technician was ongoing throughout the testing session and changes were made as deemed necessary based on patient performance on testing, technician observations and additional pertinent factors such as those listed above.  Tests Administered: Controlled Oral Word Association Test (COWAT; FAS & Animals)  Grooved Pegboard Wechsler Adult Intelligence Scale, 4th Edition (WAIS-IV) Wechsler Memory Scale, 4th Edition (WMS-IV); Older Adult Battery  Wechsler Memory Scale-Third Edition (WMS-III), select subtest   Results:  COWAT:  FAS total= 50  Z= 1.20  Animals total= 14  Z= -0.53  Grooved Pegboard:  **No tremor observed R (DH) time= 250s  Drops= 1  Percentile Rank= 12th L (NDH) time= 205s   Drops= 1 Percentile Rank= 20th  WAIS-IV:  Composite Score Summary  Scale Sum of Scaled Scores Composite Score Percentile Rank 95% Conf. Interval Qualitative Description  Verbal Comprehension 31 VCI 102 55 96-108 Average  Perceptual Reasoning 29 PRI 98 45 92-104 Average  Working Memory 22 WMI 105 63 98-111 Average  Processing Speed 12 PSI 79 8 73-89 Borderline  Full Scale 94 FSIQ 96 39 92-100 Average  General Ability 60 GAI 100 50 95-105 Average    Verbal Comprehension Subtests Summary  Subtest Raw Score Scaled Score Percentile Rank Reference Group Scaled Score SEM  Similarities 17 7 16 6  0.95  Vocabulary 43 12 75 12 0.67  Information 18 12 75 13 0.73  The scaled scores in the Reference Group Scaled Score column are based on the performance of examinees aged 20:0-34:11 (i.e., the reference group). See Chapter 6 of the WAIS-IV Technical and Interpretive Manual for more information.  Perceptual Reasoning Subtests Summary  Subtest Raw Score Scaled Score Percentile Rank Reference Group Scaled Score SEM  Block Design 24 8 25 6  0.99  Matrix Reasoning 13 11 63 7 0.90  Visual Puzzles 11 10 50 7 0.99   Working Librarian, Academic Raw Score Scaled Score Percentile Rank Reference Group Scaled Score SEM  Digit Span 28 12 75 10 0.73  Arithmetic 13 10 50 9 1.20   Processing Speed Subtests Summary  Subtest Raw Score Scaled Score Percentile Rank Reference Group Scaled Score SEM  Symbol Search 12 5 5 3  1.12  Coding 35 7 16 4  1.12    WMS-IV:   Index Score Summary  Index Sum of Scaled Scores Index Score Percentile Rank 95% Confidence Interval Qualitative Descriptor  Auditory Memory (AMI) 42 102 55 96-108 Average  Visual Memory (VMI) 16 89 23 84-94 Low Average  Immediate  Memory (IMI) 32 104 61 98-110 Average  Delayed Memory (DMI) 26 91 27 84-99 Average   Primary Subtest Scaled Score Summary  Subtest Domain Raw Score Scaled Score Percentile Rank  Logical Memory I AM 28  9 37  Logical Memory II AM 15 9 37  Verbal Paired Associates I AM 28 13 84  Verbal Paired Associates II AM 7 11 63  Visual Reproduction I VM 29 10 50  Visual Reproduction II VM 7 6 9   Symbol Span VWM 16 10 50   Auditory Memory Process Score Summary  Process Score Raw Score Scaled Score Percentile Rank Cumulative Percentage (Base Rate)  LM II Recognition 18 - - 26-50%  VPA II Recognition 29 - - >75%   Visual Memory Process Score Summary  Process Score Raw Score Scaled Score Percentile Rank Cumulative Percentage (Base Rate)  VR II Recognition 5 - - 51-75%   ABILITY-MEMORY ANALYSIS  Ability Score:  VCI: 102 Date of Testing:  WAIS-IV; WMS-IV 2024/02/19  Predicted Difference Method   Index Predicted WMS-IV Index Score Actual WMS-IV Index Score Difference Critical Value  Significant Difference Y/N Base Rate  Auditory Memory 101 102 -1 10.41 N   Visual Memory 101 89 12 7.35 Y 20%  Immediate Memory 101 104 -3 9.69 N   Delayed Memory 101 91 10 12.22 N   Statistical significance (critical value) at the .01 level.    WMS-III Spatial Span:  Spatial Span  16 ss = 14 91 %ile Above Average   SSF  8 ss = 12 75 %ile High Average   Span:     5      SSB  8 ss = 13 84 %ile High Average   Span:     6       Feedback to Patient: [name] will return on 03/07/2024 for an interactive feedback session with Dr. Corina at which time her test performances, clinical impressions and treatment recommendations will be reviewed in detail. The patient understands she can contact our office should she require our assistance before this time.  125 minutes spent face-to-face with patient administering standardized tests, 115 minutes spent scoring radiographer, therapeutic). [CPT [id number], [id number]]  Full report to follow.

## 2024-02-20 NOTE — Progress Notes (Signed)
 Neuropsychology Visit  Patient:  [name]   DOB: 1950/05/22  MR Number: [id number]  Location: [facility] [address] [place], [address] [place] [address] Dept: [phone]  Date of Service: 02/08/2024  Start: 2 PM End: 3 PM  Today's visit was conducted in my outpatient clinic office with the patient myself present.  This was an in person visit.  A digital scribe was utilized to assist in note taking in the visit today and this was explained to the patient and the patient consented to allow this service to be utilized.  This is a HIPAA compliant service and is utilized solely for assistance in notetaking.  Duration of Service: 1 Hour  Provider/Observer:     Norleen JONELLE Asa PsyD  Chief Complaint:      Chief Complaint  Patient presents with   Depression   Memory Loss   Sleeping Problem   Cerebrovascular Accident    Reason For Service:     [name] is a 73 year old female referred for neuropsychological consultation by her treating physiatrist, Dr. Joesph Likes, due to ongoing memory difficulties, depression, fatigue, and multiple falls.  Medical History: Medication Adherence: The patient has not been consistent with taking her medications regularly in the past. Sleep and Eating Habits: She has been sleeping all day and eating only one meal a day prior to moving in with her sister. Significant sleep disturbances and insomnia are noted at night. Rehabilitation Admissions: The patient has had two admissions to a comprehensive inpatient rehabilitation unit over the past year.  I saw her 1 time during her first admission. Depression: She has had depression for many years. 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 Social Isolation: She has been self-isolated for some time and has not been taking care of herself until recently moving in with her  sister. Cancer History: The patient's medical history includes a lumpectomy due to ductal carcinoma in the left breast with a radioactive seed in 2021. Sleep Apnea: She has been diagnosed with obstructive sleep apnea in the past but has not been using CPAP. Chronic Conditions: Chronic kidney disease and hyperlipidemia are also noted, along with past strokes. Psychotropic Medications: Past psychotropic medications include Lexapro  and Wellbutrin . She has taken trazodone in the past to help with sleep. Neurology Follow-Up: The patient was followed by Dr. Georjean in neurology with concerns around memory dating back to at least 2021. Previous MRIs indicated multiple areas of cerebrovascular-related brain changes. Neuropsychological Evaluation: A neuropsychological evaluation performed in 2021 did not show consistencies with a progressive neurological disorder. Memory difficulties and cognitive changes were felt to be related to previous cerebrovascular accidents. Cerebrovascular Events: The patient has had both chronic cortical-based infarcts within the bilateral frontal and left parietal lobes and multifocal bilateral acute ischemia primarily affecting deep gray matter. Two cortical foci within the right MCA were noted in a May 2021 event. Current Status: BiPAP Usage: The patient reports that after her rehab stints, she began using her BiPAP machine more regularly and sleeps between 1 and 5 hours a night with her mask. She gets up 2-4 times a night to urinate and still wakes up tired. She likes to take naps during the day. Living Situation: The patient now lives with her sister, is on a regular schedule, and is consistently taking her medications. She is eating more appropriately and has been attending her doctor's appointments. Exercise and  Therapy: However, she is regularly argumentative with her sister about doing her daily exercises and physical therapy. Independence: The patient has not been independent  and has not been driving since her strokes. She has not been able to take care of herself in her own home but still has her home and car, although she is not driving or maintaining her household. She harbors the hope of being able to drive and gain more independence but has not been actively doing her therapies without regular reminders, and even then, her participation is limited and much less than recommended.  During today's clinical visit the patient reports having had no falls since the last visit in August. This is attributed to increased awareness and being more careful, particularly about not reaching too far when trying to be independent of her walker. The brakes on her walker were recently tightened by family members. She reports using the brakes frequently, especially when navigating ramps.  The patient is engaging in rehabilitative efforts, including walking laps and chair exercises. She reports sometimes forgetting to do them. She was initially doing sit-to-stand exercises five times, but was corrected that the target was 12 repetitions. She has since increased her leg exercises to 20 repetitions. She reports being argumentative with her sister regarding daily exercises and physical therapy. Her participation is often limited and less than recommended.  The patient uses a BiPAP machine for obstructive sleep apnea. She received a new mask yesterday as the previous one had significant leakage. Last night was the first time using the new mask, and she reported it did not seal well, with air blowing above her head. She reports sleeping for almost five hours with it. She consistently removes the mask after about four hours of use, viewing this as her target. She expresses dislike for the mask and admits to not using it during daytime naps. She has a history of inconsistent CPAP use. She reports that after her rehab admissions, she began using her BiPAP more regularly, sleeping between 1 and 5 hours  with it at night. She wakes 2-4 times a night to urinate and reports still waking up tired.  Participation Level:   Active  Participation Quality:  Inattentive      Behavioral Observation:  Well Groomed, Alert, and Appropriate.   Current Psychosocial Factors: Reports renting her furnished house to a friend who is separating from her husband. The agreement is informal, with the friend only covering utilities. Expressed intent to help the friend, but also discussed the need to formalize the agreement to protect her financial investment in the property. She is currently living with her daughter, [name], indefinitely. Reports looking forward to Thanksgiving with family.  Content of Session:     Reviewed current psychosocial stressors, particularly the rental situation of her home. Discussed the importance of protecting her financial assets and setting clear boundaries with her friend. Explored adherence to and progression of her home exercise program. Provided psychoeducation on the long-term benefits of consistent exercise for functional independence and secondary stroke prevention, using the analogy of saving for retirement. Discussed the importance of focusing on achievable goals rather than unchangeable limitations, such as driving. Addressed safety concerns regarding exercise equipment, specifically advising against using a rolling chair for sit-to-stand exercises and suggesting modifications like using pillows in a stable chair. Checked the brakes on her walker and advised they need to be tightened.  Treatment Interventions:                 Extensive  psychoeducation was provided regarding the importance of consistent BiPAP use for the full duration of sleep. The relationship between untreated sleep apnea, particularly during REM sleep in the second half of the night, and its negative impact on memory, mood, motivation, fatigue, and risk of future cerebrovascular events was explained. The concept of  anhedonia was introduced and discussed in the context of her reduced motivation and perception that effortful activities are not worthwhile. A behavioral activation strategy was introduced, focusing on identifying and engaging in one activity per day that has a reasonable chance of providing a sense of accomplishment or gladness after completion, irrespective of the initial desire to do it.  Effectiveness of Interventions: Engaged well with psychoeducation, particularly the financial analogy for exercise. Was receptive to feedback regarding her home rental situation and exercise safety. Expressed motivation to increase exercise repetitions.  Target Goals:   Goals include increasing physical activity to improve mobility and reduce risk of falls and future strokes. A specific personal goal is to be able to walk in the woods to see wildlife. Continues to struggle with the inability to drive. She has decided to give her car to a great-niece.  Goals Last Reviewed:   02/08/2024  Goals Addressed Today:    Followed up on home exercise program progression. Addressed focus on unachievable goals (driving, buying a new car) and redirected focus to current, actionable goals like consistent exercise. Discussed increasing exercise repetitions, with the patient agreeing to increase some exercises to 25 repetitions. Provided specific safety recommendations for exercises.  Impression/Diagnosis:   [name] is a 73 year old female referred for neuropsychological consultation by her treating physiatrist, Dr. Joesph Likes, due to ongoing memory difficulties, depression, fatigue, and multiple falls. Medical History: Medication: Inconsistent adherence. Sleep/Eating: Slept all day, ate one meal before moving in with sister; insomnia at night. Rehabilitation: Two admissions in the past year. Depression: Long-standing. 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 Isolation: Neglected self-care until moving in with  sister. Cancer: Lumpectomy for ductal carcinoma (2021). Sleep Apnea: Diagnosed but not using CPAP. Chronic Conditions: Kidney disease, hyperlipidemia, past strokes. Medications: Lexapro , Wellbutrin , trazodone for sleep. Neurology: Memory concerns since 2021; MRIs showed cerebrovascular changes. Neuropsychology: 2021 evaluation linked memory issues to cerebrovascular accidents. Cerebrovascular Events: Chronic infarcts in frontal and parietal lobes; acute ischemia in deep gray matter; cortical foci in right MCA (May 2021). Current Status: BiPAP: Uses regularly, sleeps 1-5 hours, wakes up tired, naps during the day. Living Situation: With sister, follows schedule, takes medications, eats better, attends appointments. Exercise/Therapy: Argumentative about daily exercises. Independence: Not driving or independent since strokes; hopes to drive but needs reminders for therapy.  Diagnosis:   Major depressive disorder, recurrent episode, moderate (HCC)  History of CVA (cerebrovascular accident)  Memory deficit    Norleen Asa, Psy.D. Clinical Psychologist Neuropsychologist

## 2024-02-22 ENCOUNTER — Encounter: Admitting: Psychology

## 2024-02-22 DIAGNOSIS — F331 Major depressive disorder, recurrent, moderate: Secondary | ICD-10-CM | POA: Diagnosis not present

## 2024-02-22 DIAGNOSIS — R413 Other amnesia: Secondary | ICD-10-CM | POA: Diagnosis not present

## 2024-02-22 DIAGNOSIS — G4733 Obstructive sleep apnea (adult) (pediatric): Secondary | ICD-10-CM | POA: Diagnosis not present

## 2024-02-22 DIAGNOSIS — Z8673 Personal history of transient ischemic attack (TIA), and cerebral infarction without residual deficits: Secondary | ICD-10-CM | POA: Diagnosis not present

## 2024-02-22 NOTE — Progress Notes (Unsigned)
 Neuropsychological Evaluation   Patient:  [name]   DOB: [birth year]  MR Number: [id number]  Location: [facility] [place] [facility] [address] [place], [address] [place] [address] Dept: [phone]  Start: 11 AM End: 12 PM  Provider/Observer:     Norleen JONELLE Asa PsyD  Chief Complaint:      Chief Complaint  Patient presents with   Depression   Memory Loss   Fatigue   Sleeping Problem   Cerebrovascular Accident    Reason for Service [name], 73 year old female, referred for neuropsychological consultation due to memory difficulties, depression, fatigue, and multiple falls.  This assessment was utilized to clarify cognitive strengths and weaknesses and assist with treatment planning, safety recommendations in the context of a history of cerebrovascular disease and longstanding history of depression, sleep disturbance and multiple falls.  Medical & Psychosocial History Medication adherence: Previously inconsistent. Sleep/Eating: Severe sleep disturbance; previously slept all day, ate one meal daily. Rehab: Two inpatient rehab admissions in past year. Depression: Long-standing. 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 Social isolation: Self-neglect until moving in with sister. Cancer: Left breast lumpectomy for DCIS (2021). Sleep apnea: Diagnosed; non-compliant with CPAP. Chronic conditions: CKD, hyperlipidemia, prior strokes. Psychotropic meds: Lexapro , Wellbutrin , trazodone (past use). Neurology: Memory concerns since 2021; MRIs show cerebrovascular changes and history of previous strokes. Neuropsych eval (2021): No progressive disorder; deficits linked to strokes. CVA history: Multiple infarcts (bilateral frontal, left parietal, basal ganglia).  Current Status BiPAP: Now used regularly; sleeps 1-5 hrs/night, frequent nocturia, daytime naps. Living situation: With sister;  improved medication adherence and nutrition. Therapy/exercise: Resistant; minimal participation despite reminders. Independence: Not driving; hopes to regain independence but lacks consistent effort.  Past Medical History (Highlights) CKD (Stage 2), Type 2 diabetes, hypertension, hyperlipidemia, obesity, diabetic neuropathy, obstructive sleep apnea, breast cancer (DCIS), endometrial cancer, MRSA infection, osteoarthritis, rib fracture, rhabdomyolysis, multiple strokes.  Neuroimaging  CT head without contrast was conducted on 05/31/2023 in the [facility] ED after near syncopal event.  Findings included:   Brain: No acute territorial infarction, hemorrhage or intracranial mass. Atrophy and chronic small vessel ischemic changes of the white matter. Multiple chronic infarcts involving the left parietal lobe, left basal ganglia, and bilateral posterior frontal lobes. Nonenlarged ventricles.  Patient has had multiple other CT head conducted with similar findings.  MRI of brain without contrast was conducted on 09/10/2022 after the development of strokelike symptoms with stroke suspected.  The results included:  IMPRESSION: Acute infarcts in the bilateral corona radiata and in the left parietal lobe, new compared to 08/08/2022. No hemorrhage.  MRI brain was conducted on 08/08/2022 after another event with altered mental status and concern for stroke.  Findings included:  IMPRESSION: 1. Multifocal bilateral acute ischemia, predominantly affecting the deep gray matter. There also 2 cortical foci within the right MCA territory. No hemorrhage or mass effect. 2. Old bilateral MCA territory infarcts. 3. Mild volume loss and chronic ischemic microangiopathy.   Active Problem List Fatigue post-CVA, urinary retention, mobility impairment, recurrent depression, memory loss, CKD stage 3a, generalized weakness, morbid obesity, mixed hyperlipidemia, diabetic neuropathy, hypertension, obstructive sleep  apnea.  Behavioral/Mental Status Rumination about driving and returning home; acknowledges need for support post-discharge.  Family History Alzheimer's (mother, father), hypertension, CAD, heart attack, cancers (breast, prostate, bladder), obesity.  Testing Administered:  Tests Administered: Controlled Oral Word Association Test (COWAT; FAS & Animals)  Grooved Pegboard Wechsler Adult Intelligence Scale, 4th Edition (WAIS-IV) Wechsler Memory Scale,  4th Edition (WMS-IV); Older Adult Battery  Wechsler Memory Scale-Third Edition (WMS-III), select subtest  Participation Level:   Active  Participation Quality:  Appropriate      Behavioral Observation:  The patient appeared well-groomed and appropriately dressed. Her manners were polite and appropriate to the situation. She ambulated with the assistance of a rollating walker. She reported some fatigue at the beginning of the assessment, however this did not appear to impact her engagement with testing. Her attitude towards testing was positive and she demonstrated a good effort.   Well Groomed, Alert, and Appropriate.   Test Results:      COWAT:  FAS total= 50  Z= 1.20  Animals total= 14  Z= -0.53  Grooved Pegboard:  **No tremor observed R (DH) time= 250s  Drops= 1  Percentile Rank= 12th L (NDH) time= 205s  Drops= 1 Percentile Rank= 20th   WAIS-IV:            Composite Score Summary          Scale Sum of Scaled Scores Composite Score Percentile Rank 95% Conf. Interval Qualitative Description  Verbal Comprehension 31 VCI 102 55 96-108 Average  Perceptual Reasoning 29 PRI 98 45 92-104 Average  Working Memory 22 WMI 105 63 98-111 Average  Processing Speed 12 PSI 79 8 73-89 Borderline  Full Scale 94 FSIQ 96 39 92-100 Average  General Ability 60 GAI 100 50 95-105 Average              Verbal Comprehension Subtests Summary        Subtest Raw Score Scaled Score Percentile Rank Reference Group Scaled Score SEM   Similarities 17 7 16 6  0.95  Vocabulary 43 12 75 12 0.67  Information 18 12 75 13 0.73            Perceptual Reasoning Subtests Summary        Subtest Raw Score Scaled Score Percentile Rank Reference Group Scaled Score SEM  Block Design 24 8 25 6  0.99  Matrix Reasoning 13 11 63 7 0.90  Visual Puzzles 11 10 50 7 0.99          Working Comptroller Raw Score Scaled Score Percentile Rank Reference Group Scaled Score SEM  Digit Span 28 12 75 10 0.73  Arithmetic 13 10 50 9 1.20          Processing Speed Subtests Summary        Subtest Raw Score Scaled Score Percentile Rank Reference Group Scaled Score SEM  Symbol Search 12 5 5 3  1.12  Coding 35 7 16 4  1.12      WMS-IV:            Index Score Summary        Index Sum of Scaled Scores Index Score Percentile Rank 95% Confidence Interval Qualitative Descriptor  Auditory Memory (AMI) 42 102 55 96-108 Average  Visual Memory (VMI) 16 89 23 84-94 Low Average  Immediate Memory (IMI) 32 104 61 98-110 Average  Delayed Memory (DMI) 26 91 27 84-99 Average           Primary Subtest Scaled Score Summary       Subtest Domain Raw Score Scaled Score Percentile Rank  Logical Memory I AM 28 9 37  Logical Memory II AM 15 9 37  Verbal Paired Associates I AM 28 13 84  Verbal Paired Associates II AM 7 11 63  Visual Reproduction I VM  29 10 50  Visual Reproduction II VM 7 6 9   Symbol Span VWM 16 10 50          Auditory Memory Process Score Summary      Process Score Raw Score Scaled Score Percentile Rank Cumulative Percentage (Base Rate)  LM II Recognition 18 - - 26-50%  VPA II Recognition 29 - - >75%         Visual Memory Process Score Summary      Process Score Raw Score Scaled Score Percentile Rank Cumulative Percentage (Base Rate)  VR II Recognition 5 - - 51-75%    ABILITY-MEMORY ANALYSIS   Ability Score:    VCI: 102 Date of Testing:           WAIS-IV; WMS-IV 2024/02/19             Predicted Difference  Method    Index Predicted WMS-IV Index Score Actual WMS-IV Index Score Difference Critical Value   Significant Difference Y/N Base Rate  Auditory Memory 101 102 -1 10.41 N    Visual Memory 101 89 12 7.35 Y 20%  Immediate Memory 101 104 -3 9.69 N    Delayed Memory 101 91 10 12.22 N                WMS-III Spatial Span:   Spatial Span   16 ss = 14 91 %ile Above Average    SSF   8 ss = 12 75 %ile High Average    Span:         5          SSB   8 ss = 13 84 %ile High Average    Span:         6          Summary of Results:   Initially, estimations of validity of the current assessment were derived related to effort/attitude towards testing.  The patient's attitude towards the test procedures themselves remain positive throughout with an active participation level and appropriate participation quality noted.  There were no indicators of symptom exaggeration and the test results can be judged as valid for interpretive purposes.  Embedded validity checks were within normal limits including forced choice type questions and embedded validity checks were reviewed.  Verbal Fluency (COWAT) FAS total: 50 (Z = +1.20) ? Above average Animals total: 14 (Z = -0.53) ? Low average Interpretation: Phonemic/lexical fluency is a clear strength. Semantic fluency is mildly reduced, a pattern often observed with executive/semantic retrieval inefficiencies and vascular/frontal-subcortical involvement. Discrepant phonemic vs. semantic fluency suggests subtle word-retrieval/organization weaknesses rather than a language disorder per se.  These would be consistent with significant white matter changes noted on neuroimaging.  Grooved Pegboard (Fine Motor Dexterity) Right [facility]): 250 sec, 1 drop ? 12th percentile Left [facility]): 205 sec, 1 drop ? 20th percentile No tremor observed. Interpretation: Bilateral fine motor dexterity below average, consistent with known cerebrovascular disease, deconditioning, and mobility  impairment. Findings support slowed motor output and coordination challenges.  WAIS-IV (Ability Profile) Composite Scores: VCI (Verbal Comprehension): 102 (55th %ile) ? Average PRI (Perceptual Reasoning): 98 (45th %ile) ? Average WMI (Working Memory): 105 (63rd %ile) ? Average PSI (Processing Speed): 79 (8th %ile) ? Borderline FSIQ: 96 (39th %ile) ? Average GAI: 100 (50th %ile) ? Average Subtest highlights: Verbal strengths: Vocabulary and Information solidly Average-High Average; Similarities Low Average Perceptual Reasoning: Matrix Reasoning and Visual Puzzles Average-High Average; Block Design Low Average-Average Working Memory: Digit Span High Average; Arithmetic Average  Processing Speed: Symbol Search 5th %ile; Coding 16th %ile Interpretation: Overall intellectual functioning is Average, which is below predicted levels of premorbid functioning given the patient's past educational and occupational history.  While the patient is still performing in the average range on measures of global cognitive functioning she is showing a general decrease in most cognitive domains assessed.  Patient was noted with relative strengths in verbal reasoning and working memory. Processing speed is significantly reduced, consistent with vascular burden, psychomotor slowing, and fatigue/OSA effects. Slowed speed can broadly reduce complex task efficiency even when reasoning and memory systems are otherwise intact.  WMS-IV (Memory Profile) Index Scores: Auditory Memory (AMI): 102 (55th %ile) ? Average Visual Memory (VMI): 89 (23rd %ile) ? Low Average Immediate Memory (IMI): 104 (61st %ile) ? Average Delayed Memory (DMI): 91 (27th %ile) ? Average Primary Subtests: Logical Memory I/II (auditory story memory): Scaled = 9 / 9 ? Average immediate and delayed verbal recall Verbal Paired Associates I/II: Scaled = 13 / 11 ? High Average/Average associative learning and delayed retention Visual Reproduction I/II:  Scaled = 10 / 6 ? Average immediate visual memory with Low delayed recall (9th %ile) Symbol Span (visual working memory): Scaled = 10 ? Average Process scores: LM II Recognition: Base rate 26-50% VPA II Recognition: Base rate >75% VR II Recognition: Base rate 51-75% Ability-Memory Analysis (Predicted Difference Method; VCI = 102): Visual Memory: Predicted 101 vs. Actual 89 ? Significant difference (? = 12; CV = 7.35; Yes; Base Rate ? 20%) Auditory/Immediate/Delayed: Differences not significant Interpretation: Auditory/verbal memory is intact to strong, including paired associative learning and delayed retention. Visual memory shows a clinically meaningful weakness, particularly for delayed recall (VR II), beyond expectations from ability--suggesting a specific vulnerability in visual encoding/storage or retrieval that is consistent with cerebrovascular involvement that has been involved with at least one of her strokes in the right parietal brain region.  Recognition performances indicate some benefit when cues are present, which is typical for retrieval inefficiencies with partial encoding compromise.  WMS-III Spatial Span (selected) Spatial Span Total: Scaled = 14 (91st %ile) ? Above Average Forward: Scaled = 12 (75th %ile) ? High Average; Span = 5 Backward: Scaled = 13 (84th %ile) ? High Average; Span = 6 Interpretation: Visual working memory and attentional control are preserved, highlighting a dissociation between intact short-term visuospatial manipulation and reduced long-term visual memory retention.  Integrated Interpretation Profile summary: Strengths: Verbal reasoning, working civil service fast streamer (auditory and visual working memory), lexical fluency, programmer, applications and retention. Weaknesses: Processing speed (Borderline), visual memory (Low Average with weak delayed recall), semantic fluency (Low Average), bilateral fine motor dexterity (below average). Pattern & likely etiology: The combination  of significantly slowed processing speed, semantic fluency weakness, and visual memory vulnerability within an otherwise average intellectual range is highly consistent with a frontal-subcortical (vascular) dysexecutive profile. Her history of multiple infarcts (bilateral frontal, bilateral parietal, basal ganglia) and MRI-demonstrated cerebrovascular changes supports this interpretation. Ongoing depression, fatigue, and OSA-related sleep fragmentation likely compound attentional efficiency and psychomotor speed, reducing performance consistency and functional endurance. Memory findings do not indicate a pervasive amnestic syndrome; verbal memory is largely preserved. The visual memory weakness appears disproportionate to predicted ability, consistent with cerebrovascular burden and slowed consolidation/retrieval in visual domains. Functional implications: Slowed processing and motor dexterity deficits increase fall risk and reduce task efficiency (e.g., medication management, complex IADLs). Preserved verbal learning/retention and strong visual working memory can be leveraged in patent attorney. Motivation and engagement remain variable; resistance to therapy/exercise may hinder  recovery/maintenance gains.  Diagnostic Impressions: Cognitive profile consistent with vascular cognitive impairment / dysexecutive syndrome in the context of multi-infarct cerebrovascular disease and significant small vessel disease Contributing factors: Major depressive symptoms (recurrent), obstructive sleep apnea (partially treated), medical multimorbidity (CKD, diabetes, hypertension, hyperlipidemia, obesity), deconditioning No neuropsychological evidence at this time for a progressive amnestic neurodegenerative process; cognitive deficits appear primarily vascular and mood/medical-comorbidity related. Ongoing surveillance remains appropriate given family history of Alzheimer's disease.  Recommendations 1)  Medical & Risk-Factor Management Strict adherence to BiPAP with sleep hygiene protocol; address nocturia (urology/primary care) to improve sleep continuity. Aggressive vascular risk reduction: Optimize BP, lipids, glycemic control; continue antiplatelet/anticoagulation as medically indicated. Medication adherence: Maintain improved adherence; use pill organizers, blister packs, or automated dispensers as needed. 2) Mood & Motivation Behavioral activation and structured psychotherapy (CBT or problem-solving therapy) to address depression, apathy, and improve consistency of effort. Consider re-evaluating antidepressant regimen with psychiatry/primary care to balance efficacy with daytime alertness. 3) Cognitive & Functional Supports Compensatory strategies:  External memory aids (daily planner/calendar, alarms, checklists). Task simplification and step-by-step routines; break tasks into short intervals with rest periods. Emphasize verbal mediation (self-talk) and verbal rehearsal, leveraging stronger verbal memory systems. Environmental modifications: Reduce clutter; ensure adequate lighting; remove trip hazards; install grab bars/handrails. Driving: Not recommended at present. 4) Rehabilitation & Safety Physical therapy for strength, balance, gait stabilization; reinforce home exercise program with motivational supports (scheduling, caregiver involvement). Occupational therapy for energy conservation, ADL/IADL strategies, and safe mobility within the home. Fall-prevention program and assistive device optimization (proper rollator use; footwear; home safety audit). Care coordination: Continue living with sister and caregiver oversight for medication, appointments, and finances. 5) Follow-Up & Monitoring Neuropsychological re-evaluation may not be needed as a clear clinical picture has presented itself.  If there are significant changes with need for changing and recommendations consideration of  repeat neuropsychological testing in 12-18 months or sooner if significant change is noted, to monitor stability/progression and adjust recommendations. Neurology follow-up to review imaging, vascular management, and sleep optimization.  Prognosis With sustained adherence to BiPAP, vascular risk-factor control, structured behavioral activation, and consistent participation in PT/OT, functional stability with modest improvement in daily efficiency is achievable. Persistent depression, fragmented sleep, and limited therapy engagement are negative prognostic indicators; addressing these will materially impact outcomes.   Diagnosis:    Late effects of cerebrovascular accident  Major depressive disorder, recurrent episode, moderate (HCC)  History of CVA (cerebrovascular accident)  Memory loss due to medical condition  Obstructive sleep apnea  Type 2 diabetes mellitus with hyperglycemia, with long-term current use of insulin   Fatigue as sequela of cerebrovascular accident (CVA)   _____________________ Norleen Asa, Psy.D. Clinical Neuropsychologist

## 2024-02-23 ENCOUNTER — Ambulatory Visit: Admitting: Podiatry

## 2024-02-23 DIAGNOSIS — E11621 Type 2 diabetes mellitus with foot ulcer: Secondary | ICD-10-CM | POA: Diagnosis not present

## 2024-02-23 DIAGNOSIS — L97422 Non-pressure chronic ulcer of left heel and midfoot with fat layer exposed: Secondary | ICD-10-CM | POA: Diagnosis not present

## 2024-02-29 ENCOUNTER — Ambulatory Visit: Admitting: Psychology

## 2024-02-29 NOTE — Progress Notes (Signed)
 Subjective:  [name], female    DOB: [birth year],   MRN: [id number]  Chief Complaint  Patient presents with   Foot Ulcer    left foot ulcer plantar. O pain. Wearing surgical shoe.    73 y.o. female presents for follow-up of left foot wound.  She has been using Prisma on the ulcer.  She does not report any purulence.  No present swelling or redness.  No fevers or chills.  No other concerns today.   Objective:  Physical Exam: Vascular: DP/PT pulses 2/4 bilateral. CFT <3 seconds. Feet cold to touch. Absent hair growth on digits. Edema noted to bilateral lower extremities. Xerosis noted bilaterally.  Skin.  Ulceration noted on the area the bunion left foot.  Today the wound measures 1 x 0.8 x 0.2 cm. I did improvement the wound was slightly smaller measuring 0.8 x 0.5 x 0.1 cm.  There is no surrounding erythema, ascending cellulitis.  No fluctuation or crepitation but is noted. Musculoskeletal: Significant bunion noted which has been chronic.  There is no other areas of tenderness.   Neurological: Protective sensation diminished bilateral.    Assessment:   1. Foot ulcer, left, with fat layer exposed (HCC)   2. Diabetic peripheral neuropathy associated with type 2 diabetes mellitus (HCC)      Plan:  Patient was evaluated and treated and all questions answered. Ulcer plantar first metatarsal head on left with fat layer exposed  -Debridement as below. -Dressed with prisma, DSD. -Off-loading with surgical shoe and pegassist.  -No abx indicated.  Continue to monitor for any signs or symptoms of infection. -Discussed glucose control and proper protein-rich diet.  -Discussed if any worsening redness, pain, fever or chills to call or may need to report to the emergency room. Patient expressed understanding.    Procedure: Excisional Debridement of Wound Tool: Sharp #312 chisel blade/tissue nipper Type of Debridement: Sharp Excisional Frequency: Every two weeks until  appropriately healed.  Dressing is to be changed daily/keeping the wound clean and dry Rationale: Removal of non-viable soft tissue from the wound to promote healing.  Anesthesia: none Pre-Debridement Wound Measurements: 0.8 cm x 0.5 cm x 0.1 cm  Post-Debridement Wound Measurements: 1 cm x 0.8 cm x 0.2 cm  Area devitalized tissue removed(nonviable tissue only): 1 cm x 0.8 cm.  Blood loss: Minimal (<10cc) Depth of Debridement: with fat layer exposed Description of tissue removed: Devitalized Tissue and Non-viable tissue Technique: The wound and the surrounding skin were prepped and draped in usual aseptic fashion.  Aseptic technique was maintained throughout the procedure.  Using #312 blade/tissue nipper sharp debridement of necrotic/nonviable tissue was performed until healthy bleeding wound bed was achieved.  No underlying bone or tendon was exposed during debridement.  The wound was thoroughly irrigated with normal saline solution Wound Progress:  Current Wound Volume: Debridement was performed of the chronic nonhealing diabetic foot wound on plantar left first metatarsal head.  Debridement removed 1 cm x 0.8 cm of the necrotic tissue and subcutaneous tissue and  purulent drainage was not present. Presence/absence of tissue: Necrotic tissue/nonviable tissue present at the base of the wound.  Sharp debridement was performed to remove the necrotic tissue/nonviable tissue back to viable tissue.  No devitalized/nonviable tissue present postdebridement.  Wound appeared clean and clear of infection No material in the wound was present that was identified to be inhibiting healing. Dressing: Small amount of antibiotic ointment is applied followed by Betadine ointment and a bandage.  Recommended to switch  back to using blast X with a collagen powder mixture. Disposition: Patient tolerated procedure well. Patient to return in 2 weeks for follow-up or as listed above.   [name] DPM

## 2024-03-07 ENCOUNTER — Encounter: Payer: Self-pay | Admitting: Psychology

## 2024-03-07 ENCOUNTER — Encounter: Admitting: Psychology

## 2024-03-07 DIAGNOSIS — G4733 Obstructive sleep apnea (adult) (pediatric): Secondary | ICD-10-CM | POA: Diagnosis not present

## 2024-03-07 DIAGNOSIS — R413 Other amnesia: Secondary | ICD-10-CM | POA: Diagnosis not present

## 2024-03-07 DIAGNOSIS — F331 Major depressive disorder, recurrent, moderate: Secondary | ICD-10-CM | POA: Diagnosis not present

## 2024-03-07 DIAGNOSIS — Z794 Long term (current) use of insulin: Secondary | ICD-10-CM

## 2024-03-07 DIAGNOSIS — I6930 Unspecified sequelae of cerebral infarction: Secondary | ICD-10-CM | POA: Diagnosis not present

## 2024-03-07 DIAGNOSIS — E1165 Type 2 diabetes mellitus with hyperglycemia: Secondary | ICD-10-CM | POA: Diagnosis not present

## 2024-03-07 DIAGNOSIS — Z8673 Personal history of transient ischemic attack (TIA), and cerebral infarction without residual deficits: Secondary | ICD-10-CM

## 2024-03-07 NOTE — Progress Notes (Signed)
 Neuropsychological Evaluation   Patient:  [name]   DOB: 1950-05-24  MR Number: [id number]  Location: [facility] [place] [facility] [address] [place], [address] [place] [address] Dept: [phone]  Start: 2 PM End: 3 PM  Provider/Observer:     Norleen JONELLE Asa PsyD  Chief Complaint:      Chief Complaint  Patient presents with   Depression   Memory Loss   Fatigue   Sleeping Problem   Cerebrovascular Accident   03/07/2024 1 PM-2 PM: Today I provided feedback regarding the results of the recent neuropsychological evaluation.  We went over the results with the patient and her sister.  Today, the patient was rather somnolent and expressed ongoing fatigue and difficulty with sleep.  The patient did not fully participate and was able to comprehend our discussions although she is clearly having ongoing cognitive difficulties.  [name] was referred for a neuropsychological consultation due to memory difficulties, depression, fatigue, and multiple falls. The assessment was utilized to clarify cognitive strengths and weaknesses and assist with treatment planning and safety recommendations in the context of a history of cerebrovascular disease, longstanding depression, sleep disturbance, and multiple falls.  Below I have included a copy of the reason for service and summary of the formal neuropsychological evaluation for convenience and the entire neuropsychological evaluative report can be found in the patient's EMR dated 12//2025.  Reason for Service Holly RONAL [name], 73 year old female, referred for neuropsychological consultation due to memory difficulties, depression, fatigue, and multiple falls.  This assessment was utilized to clarify cognitive strengths and weaknesses and assist with treatment planning, safety recommendations in the context of a history of cerebrovascular disease and longstanding  history of depression, sleep disturbance and multiple falls.  Medical & Psychosocial History Medication adherence: Previously inconsistent. Sleep/Eating: Severe sleep disturbance; previously slept all day, ate one meal daily. Rehab: Two inpatient rehab admissions in past year. Depression: Long-standing. 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 Social isolation: Self-neglect until moving in with sister. Cancer: Left breast lumpectomy for DCIS (2021). Sleep apnea: Diagnosed; non-compliant with CPAP. Chronic conditions: CKD, hyperlipidemia, prior strokes. Psychotropic meds: Lexapro , Wellbutrin , trazodone (past use). Neurology: Memory concerns since 2021; MRIs show cerebrovascular changes and history of previous strokes. Neuropsych eval (2021): No progressive disorder; deficits linked to strokes. CVA history: Multiple infarcts (bilateral frontal, left parietal, basal ganglia).  Current Status BiPAP: Now used regularly; sleeps 1-5 hrs/night, frequent nocturia, daytime naps. Living situation: With sister; improved medication adherence and nutrition. Therapy/exercise: Resistant; minimal participation despite reminders. Independence: Not driving; hopes to regain independence but lacks consistent effort.  Past Medical History (Highlights) CKD (Stage 2), Type 2 diabetes, hypertension, hyperlipidemia, obesity, diabetic neuropathy, obstructive sleep apnea, breast cancer (DCIS), endometrial cancer, MRSA infection, osteoarthritis, rib fracture, rhabdomyolysis, multiple strokes.   Diagnostic Impressions: Cognitive profile consistent with vascular cognitive impairment / dysexecutive syndrome in the context of multi-infarct cerebrovascular disease and significant small vessel disease Contributing factors: Major depressive symptoms (recurrent), obstructive sleep apnea (partially treated), medical multimorbidity (CKD, diabetes, hypertension, hyperlipidemia, obesity), deconditioning No neuropsychological  evidence at this time for a progressive amnestic neurodegenerative process; cognitive deficits appear primarily vascular and mood/medical-comorbidity related. Ongoing surveillance remains appropriate given family history of Alzheimer's disease.  Recommendations 1) Medical & Risk-Factor Management Strict adherence to BiPAP with sleep hygiene protocol; address nocturia (urology/primary care) to improve sleep continuity. Aggressive vascular risk reduction: Optimize BP, lipids, glycemic control; continue antiplatelet/anticoagulation as medically indicated. Medication adherence: Maintain  improved adherence; use pill organizers, blister packs, or automated dispensers as needed. 2) Mood & Motivation Behavioral activation and structured psychotherapy (CBT or problem-solving therapy) to address depression, apathy, and improve consistency of effort. Consider re-evaluating antidepressant regimen with psychiatry/primary care to balance efficacy with daytime alertness. 3) Cognitive & Functional Supports Compensatory strategies:  External memory aids (daily planner/calendar, alarms, checklists). Task simplification and step-by-step routines; break tasks into short intervals with rest periods. Emphasize verbal mediation (self-talk) and verbal rehearsal, leveraging stronger verbal memory systems. Environmental modifications: Reduce clutter; ensure adequate lighting; remove trip hazards; install grab bars/handrails. Driving: Not recommended at present. 4) Rehabilitation & Safety Physical therapy for strength, balance, gait stabilization; reinforce home exercise program with motivational supports (scheduling, caregiver involvement). Occupational therapy for energy conservation, ADL/IADL strategies, and safe mobility within the home. Fall-prevention program and assistive device optimization (proper rollator use; footwear; home safety audit). Care coordination: Continue living with sister and caregiver oversight  for medication, appointments, and finances. 5) Follow-Up & Monitoring Neuropsychological re-evaluation may not be needed as a clear clinical picture has presented itself.  If there are significant changes with need for changing and recommendations consideration of repeat neuropsychological testing in 12-18 months or sooner if significant change is noted, to monitor stability/progression and adjust recommendations. Neurology follow-up to review imaging, vascular management, and sleep optimization.  Prognosis With sustained adherence to BiPAP, vascular risk-factor control, structured behavioral activation, and consistent participation in PT/OT, functional stability with modest improvement in daily efficiency is achievable. Persistent depression, fragmented sleep, and limited therapy engagement are negative prognostic indicators; addressing these will materially impact outcomes.   Diagnosis:    Late effects of cerebrovascular accident  Major depressive disorder, recurrent episode, moderate (HCC)  History of CVA (cerebrovascular accident)  Memory loss due to medical condition  Obstructive sleep apnea  Type 2 diabetes mellitus with hyperglycemia, with long-term current use of insulin    _____________________ Norleen Asa, Psy.D. Clinical Neuropsychologist

## 2024-03-09 ENCOUNTER — Encounter

## 2024-03-13 ENCOUNTER — Ambulatory Visit

## 2024-03-13 DIAGNOSIS — I639 Cerebral infarction, unspecified: Secondary | ICD-10-CM | POA: Diagnosis not present

## 2024-03-13 LAB — CUP PACEART REMOTE DEVICE CHECK
Date Time Interrogation Session: 20251224072500
Implantable Pulse Generator Implant Date: 20240626
Pulse Gen Serial Number: 105871

## 2024-03-15 NOTE — Progress Notes (Signed)
 Remote Loop Recorder Transmission

## 2024-03-19 ENCOUNTER — Ambulatory Visit: Admitting: Podiatry

## 2024-03-19 DIAGNOSIS — E11621 Type 2 diabetes mellitus with foot ulcer: Secondary | ICD-10-CM

## 2024-03-19 DIAGNOSIS — L03116 Cellulitis of left lower limb: Secondary | ICD-10-CM

## 2024-03-19 DIAGNOSIS — L97422 Non-pressure chronic ulcer of left heel and midfoot with fat layer exposed: Secondary | ICD-10-CM | POA: Diagnosis not present

## 2024-03-19 MED ORDER — DOXYCYCLINE HYCLATE 100 MG PO TABS: 100.0000 mg | ORAL_TABLET | Freq: Two times a day (BID) | ORAL | 0 refills | Status: AC

## 2024-03-20 ENCOUNTER — Ambulatory Visit: Payer: Self-pay | Admitting: Cardiovascular Disease

## 2024-03-20 NOTE — Progress Notes (Signed)
 "  Subjective:  [name], female    DOB: October 12, 1950,   MRN: [id number]  Chief Complaint  Patient presents with   Foot Ulcer     left foot ulcer plantar sub met 1. O pain no feeling in foot. Wearing surgical shoe.        73 y.o. female presents for follow-up of left foot wound.  She has been using BlastX and collagen on the ulcer.  She does not report any purulence.  Her sister is concerned about the way the skin looks around the wound.  She does not endorse any fevers or chills.  No pus advised report.   Objective:  Physical Exam: Vascular: DP/PT pulses 2/4 bilateral. CFT <3 seconds. Feet cold to touch. Absent hair growth on digits. Edema noted to bilateral lower extremities. Xerosis noted bilaterally.  Skin.  Ulceration noted on the area the bunion left foot.  Today the wound measures 1.1 x 0.8 x 0.2 cm. I did improvement the wound measuring 0.8 x 0.6 x 0.1 cm.  There is some mild surrounding erythema which is new today but there is no ascending cellulitis but there is no fluctuation or crepitation.  No malodor noted. Musculoskeletal: Significant bunion noted which has been chronic.  There is no other areas of tenderness.   Neurological: Protective sensation diminished bilateral.    Assessment:   1. Foot ulcer, left, with fat layer exposed (HCC)   2. Diabetic peripheral neuropathy associated with type 2 diabetes mellitus (HCC)      Plan:  Patient was evaluated and treated and all questions answered.  Concern for cellulitis -Prescribed doxycycline   Ulcer plantar first metatarsal head on left with fat layer exposed  -Debridement as below. -Dressed with prisma, DSD. -Off-loading with surgical shoe and pegassist.  She was also using the bunion pad, silicone pad which I think could be causing some irritation so recommended discontinuing this. -Discussed glucose control and proper protein-rich diet.  -Discussed if any worsening redness, pain, fever or chills to  call or may need to report to the emergency room. Patient expressed understanding.    Procedure: Excisional Debridement of Wound Tool: Sharp #312 chisel blade/tissue nipper Type of Debridement: Sharp Excisional Frequency: Every two weeks until appropriately healed.  Dressing is to be changed daily/keeping the wound clean and dry Rationale: Removal of non-viable soft tissue from the wound to promote healing.  Anesthesia: none Pre-Debridement Wound Measurements: 0.8 cm x 0.6 cm x 0.1 cm  Post-Debridement Wound Measurements: 1.1 cm x 0.8 cm x 0.2 cm  Area devitalized tissue removed(nonviable tissue only): 1 cm x 0.8 cm.  Blood loss: Minimal (<10cc) Depth of Debridement: with fat layer exposed Description of tissue removed: Devitalized Tissue and Non-viable tissue Technique: The wound and the surrounding skin were prepped and draped in usual aseptic fashion.  Aseptic technique was maintained throughout the procedure.  Using #312 blade/tissue nipper sharp debridement of necrotic/nonviable tissue was performed until healthy bleeding wound bed was achieved.  No underlying bone or tendon was exposed during debridement.  The wound was thoroughly irrigated with normal saline solution Wound Progress:  Current Wound Volume: Debridement was performed of the chronic nonhealing diabetic foot wound on plantar left first metatarsal head.  Debridement removed 1.1 cm x 0.8 cm of the necrotic tissue and subcutaneous tissue and  purulent drainage was not present. Presence/absence of tissue: Necrotic tissue/nonviable tissue present at the base of the wound.  Sharp debridement was performed to remove the necrotic tissue/nonviable tissue  back to viable tissue.  No devitalized/nonviable tissue present postdebridement.  Wound appeared clean and clear of infection No material in the wound was present that was identified to be inhibiting healing. Dressing: Small amount of antibiotic ointment is applied followed by Betadine  ointment and a bandage.  Recommended to switch back to using blast X with a collagen powder mixture. Disposition: Patient tolerated procedure well. Patient to return in 2 weeks for follow-up or as listed above.   [name] DPM "

## 2024-04-09 ENCOUNTER — Encounter

## 2024-04-11 ENCOUNTER — Ambulatory Visit: Admitting: Podiatry

## 2024-04-13 ENCOUNTER — Ambulatory Visit: Attending: Cardiovascular Disease

## 2024-04-13 DIAGNOSIS — I639 Cerebral infarction, unspecified: Secondary | ICD-10-CM

## 2024-04-15 ENCOUNTER — Ambulatory Visit: Admitting: Podiatry

## 2024-04-15 LAB — CUP PACEART REMOTE DEVICE CHECK
Date Time Interrogation Session: 20260124003000
Implantable Pulse Generator Implant Date: 20240626
Pulse Gen Serial Number: 105871

## 2024-04-16 ENCOUNTER — Ambulatory Visit: Payer: Self-pay | Admitting: Cardiovascular Disease

## 2024-04-18 NOTE — Progress Notes (Signed)
 Remote Loop Recorder Transmission

## 2024-04-29 ENCOUNTER — Ambulatory Visit: Admitting: Podiatry

## 2024-05-10 ENCOUNTER — Encounter

## 2024-05-14 ENCOUNTER — Ambulatory Visit

## 2024-06-14 ENCOUNTER — Ambulatory Visit

## 2024-07-15 ENCOUNTER — Ambulatory Visit

## 2024-08-15 ENCOUNTER — Ambulatory Visit

## 2024-09-15 ENCOUNTER — Ambulatory Visit

## 2024-10-16 ENCOUNTER — Ambulatory Visit

## 2024-11-16 ENCOUNTER — Ambulatory Visit

## 2024-12-17 ENCOUNTER — Ambulatory Visit
